# Patient Record
Sex: Female | Born: 1935
Health system: Southern US, Community
[De-identification: ages and names within clinical notes are randomized; demographics above are authoritative.]

## PROBLEM LIST (undated history)

## (undated) ENCOUNTER — Emergency Department (HOSPITAL_COMMUNITY): Discharge status: Absent without leave | Payer: Medicare HMO

## (undated) DIAGNOSIS — Z973 Presence of spectacles and contact lenses: Secondary | ICD-10-CM

## (undated) DIAGNOSIS — R0602 Shortness of breath: Secondary | ICD-10-CM

## (undated) DIAGNOSIS — I350 Nonrheumatic aortic (valve) stenosis: Secondary | ICD-10-CM

## (undated) DIAGNOSIS — Z972 Presence of dental prosthetic device (complete) (partial): Secondary | ICD-10-CM

## (undated) DIAGNOSIS — R233 Spontaneous ecchymoses: Secondary | ICD-10-CM

## (undated) DIAGNOSIS — G4730 Sleep apnea, unspecified: Secondary | ICD-10-CM

## (undated) DIAGNOSIS — R197 Diarrhea, unspecified: Secondary | ICD-10-CM

## (undated) DIAGNOSIS — F419 Anxiety disorder, unspecified: Secondary | ICD-10-CM

## (undated) DIAGNOSIS — M549 Dorsalgia, unspecified: Secondary | ICD-10-CM

## (undated) DIAGNOSIS — I5032 Chronic diastolic (congestive) heart failure: Secondary | ICD-10-CM

## (undated) DIAGNOSIS — R51 Headache: Secondary | ICD-10-CM

## (undated) DIAGNOSIS — F329 Major depressive disorder, single episode, unspecified: Secondary | ICD-10-CM

## (undated) DIAGNOSIS — K759 Inflammatory liver disease, unspecified: Secondary | ICD-10-CM

## (undated) DIAGNOSIS — M1990 Unspecified osteoarthritis, unspecified site: Secondary | ICD-10-CM

## (undated) DIAGNOSIS — E785 Hyperlipidemia, unspecified: Secondary | ICD-10-CM

## (undated) DIAGNOSIS — F32A Depression, unspecified: Secondary | ICD-10-CM

## (undated) DIAGNOSIS — I10 Essential (primary) hypertension: Secondary | ICD-10-CM

## (undated) DIAGNOSIS — R519 Headache, unspecified: Secondary | ICD-10-CM

## (undated) DIAGNOSIS — D649 Anemia, unspecified: Secondary | ICD-10-CM

## (undated) DIAGNOSIS — R238 Other skin changes: Secondary | ICD-10-CM

## (undated) DIAGNOSIS — G4734 Idiopathic sleep related nonobstructive alveolar hypoventilation: Secondary | ICD-10-CM

## (undated) DIAGNOSIS — G8929 Other chronic pain: Secondary | ICD-10-CM

## (undated) HISTORY — DX: Presence of dental prosthetic device (complete) (partial): Z97.2

## (undated) HISTORY — DX: Presence of spectacles and contact lenses: Z97.3

## (undated) HISTORY — DX: Anxiety disorder, unspecified: F41.9

## (undated) HISTORY — PX: KNEE ARTHROSCOPY: SHX127

## (undated) HISTORY — DX: Major depressive disorder, single episode, unspecified: F32.9

## (undated) HISTORY — DX: Diarrhea, unspecified: R19.7

## (undated) HISTORY — PX: APPENDECTOMY: SHX54

## (undated) HISTORY — DX: Headache, unspecified: R51.9

## (undated) HISTORY — PX: ABDOMINAL HYSTERECTOMY: SHX81

## (undated) HISTORY — DX: Essential (primary) hypertension: I10

## (undated) HISTORY — DX: Anemia, unspecified: D64.9

## (undated) HISTORY — PX: TONSILLECTOMY: SUR1361

## (undated) HISTORY — PX: CHOLECYSTECTOMY: SHX55

## (undated) HISTORY — DX: Other chronic pain: G89.29

## (undated) HISTORY — PX: JOINT REPLACEMENT: SHX530

## (undated) HISTORY — DX: Spontaneous ecchymoses: R23.3

## (undated) HISTORY — DX: Headache: R51

## (undated) HISTORY — DX: Hyperlipidemia, unspecified: E78.5

## (undated) HISTORY — DX: Depression, unspecified: F32.A

## (undated) HISTORY — PX: TONSILLECTOMY: SHX5217

## (undated) HISTORY — DX: Unspecified osteoarthritis, unspecified site: M19.90

## (undated) HISTORY — DX: Other skin changes: R23.8

## (undated) SURGERY — ARTHROSCOPY, SHOULDER
Anesthesia: General | Laterality: Left

---

## 2002-01-05 ENCOUNTER — Encounter: Admission: RE | Admit: 2002-01-05 | Discharge: 2002-01-05 | Payer: Self-pay | Admitting: *Deleted

## 2002-04-22 ENCOUNTER — Emergency Department (HOSPITAL_COMMUNITY): Admission: EM | Admit: 2002-04-22 | Discharge: 2002-04-22 | Payer: Self-pay | Admitting: Emergency Medicine

## 2003-03-02 ENCOUNTER — Encounter: Admission: RE | Admit: 2003-03-02 | Discharge: 2003-03-02 | Payer: Self-pay | Admitting: Gastroenterology

## 2003-03-02 ENCOUNTER — Encounter: Payer: Self-pay | Admitting: Gastroenterology

## 2003-03-18 ENCOUNTER — Emergency Department (HOSPITAL_COMMUNITY): Admission: EM | Admit: 2003-03-18 | Discharge: 2003-03-18 | Payer: Self-pay | Admitting: Emergency Medicine

## 2003-03-21 ENCOUNTER — Inpatient Hospital Stay (HOSPITAL_COMMUNITY): Admission: EM | Admit: 2003-03-21 | Discharge: 2003-03-26 | Payer: Self-pay | Admitting: Psychiatry

## 2003-04-05 ENCOUNTER — Encounter: Payer: Self-pay | Admitting: Internal Medicine

## 2003-04-05 ENCOUNTER — Ambulatory Visit (HOSPITAL_COMMUNITY): Admission: RE | Admit: 2003-04-05 | Discharge: 2003-04-05 | Payer: Self-pay | Admitting: Internal Medicine

## 2003-04-18 ENCOUNTER — Encounter (INDEPENDENT_AMBULATORY_CARE_PROVIDER_SITE_OTHER): Payer: Self-pay | Admitting: *Deleted

## 2003-04-18 ENCOUNTER — Ambulatory Visit (HOSPITAL_COMMUNITY): Admission: RE | Admit: 2003-04-18 | Discharge: 2003-04-18 | Payer: Self-pay | Admitting: Gastroenterology

## 2003-04-20 ENCOUNTER — Encounter: Admission: RE | Admit: 2003-04-20 | Discharge: 2003-04-20 | Payer: Self-pay | Admitting: *Deleted

## 2003-07-11 ENCOUNTER — Ambulatory Visit (HOSPITAL_COMMUNITY): Admission: RE | Admit: 2003-07-11 | Discharge: 2003-07-11 | Payer: Self-pay | Admitting: Internal Medicine

## 2003-07-11 ENCOUNTER — Encounter: Payer: Self-pay | Admitting: Internal Medicine

## 2004-05-20 ENCOUNTER — Emergency Department (HOSPITAL_COMMUNITY): Admission: EM | Admit: 2004-05-20 | Discharge: 2004-05-20 | Payer: Self-pay | Admitting: Emergency Medicine

## 2005-04-10 ENCOUNTER — Ambulatory Visit (HOSPITAL_COMMUNITY): Admission: RE | Admit: 2005-04-10 | Discharge: 2005-04-10 | Payer: Self-pay | Admitting: Orthopedic Surgery

## 2005-07-09 ENCOUNTER — Encounter: Admission: RE | Admit: 2005-07-09 | Discharge: 2005-07-09 | Payer: Self-pay | Admitting: Orthopedic Surgery

## 2005-10-21 ENCOUNTER — Ambulatory Visit (HOSPITAL_COMMUNITY): Admission: RE | Admit: 2005-10-21 | Discharge: 2005-10-21 | Payer: Self-pay | Admitting: Orthopedic Surgery

## 2005-10-24 ENCOUNTER — Ambulatory Visit (HOSPITAL_COMMUNITY): Admission: RE | Admit: 2005-10-24 | Discharge: 2005-10-24 | Payer: Self-pay | Admitting: Orthopedic Surgery

## 2006-01-07 ENCOUNTER — Encounter: Admission: RE | Admit: 2006-01-07 | Discharge: 2006-01-07 | Payer: Self-pay | Admitting: Family Medicine

## 2006-02-20 ENCOUNTER — Ambulatory Visit (HOSPITAL_COMMUNITY): Admission: RE | Admit: 2006-02-20 | Discharge: 2006-02-20 | Payer: Self-pay | Admitting: Orthopedic Surgery

## 2006-03-06 ENCOUNTER — Encounter: Admission: RE | Admit: 2006-03-06 | Discharge: 2006-06-04 | Payer: Self-pay | Admitting: Orthopedic Surgery

## 2006-05-31 ENCOUNTER — Emergency Department (HOSPITAL_COMMUNITY): Admission: EM | Admit: 2006-05-31 | Discharge: 2006-05-31 | Payer: Self-pay | Admitting: Emergency Medicine

## 2006-06-02 ENCOUNTER — Ambulatory Visit (HOSPITAL_COMMUNITY): Admission: RE | Admit: 2006-06-02 | Discharge: 2006-06-02 | Payer: Self-pay | Admitting: Orthopedic Surgery

## 2006-07-04 ENCOUNTER — Encounter: Admission: RE | Admit: 2006-07-04 | Discharge: 2006-07-04 | Payer: Self-pay | Admitting: Orthopedic Surgery

## 2006-10-27 ENCOUNTER — Emergency Department (HOSPITAL_COMMUNITY): Admission: EM | Admit: 2006-10-27 | Discharge: 2006-10-27 | Payer: Self-pay | Admitting: Emergency Medicine

## 2007-02-06 ENCOUNTER — Encounter: Admission: RE | Admit: 2007-02-06 | Discharge: 2007-02-06 | Payer: Self-pay | Admitting: Orthopedic Surgery

## 2007-02-20 ENCOUNTER — Encounter: Admission: RE | Admit: 2007-02-20 | Discharge: 2007-02-20 | Payer: Self-pay | Admitting: Orthopedic Surgery

## 2007-03-13 ENCOUNTER — Ambulatory Visit (HOSPITAL_COMMUNITY): Admission: RE | Admit: 2007-03-13 | Discharge: 2007-03-14 | Payer: Self-pay | Admitting: Orthopedic Surgery

## 2007-03-30 ENCOUNTER — Encounter: Admission: RE | Admit: 2007-03-30 | Discharge: 2007-05-19 | Payer: Self-pay | Admitting: Orthopedic Surgery

## 2007-07-15 ENCOUNTER — Emergency Department (HOSPITAL_COMMUNITY): Admission: EM | Admit: 2007-07-15 | Discharge: 2007-07-16 | Payer: Self-pay | Admitting: Emergency Medicine

## 2007-08-21 ENCOUNTER — Encounter: Admission: RE | Admit: 2007-08-21 | Discharge: 2007-08-21 | Payer: Self-pay | Admitting: Orthopedic Surgery

## 2008-01-02 ENCOUNTER — Emergency Department (HOSPITAL_COMMUNITY): Admission: EM | Admit: 2008-01-02 | Discharge: 2008-01-02 | Payer: Self-pay | Admitting: Family Medicine

## 2008-04-07 ENCOUNTER — Encounter: Admission: RE | Admit: 2008-04-07 | Discharge: 2008-04-07 | Payer: Self-pay | Admitting: Orthopedic Surgery

## 2008-05-17 ENCOUNTER — Ambulatory Visit (HOSPITAL_COMMUNITY): Admission: RE | Admit: 2008-05-17 | Discharge: 2008-05-18 | Payer: Self-pay | Admitting: Orthopedic Surgery

## 2008-11-25 HISTORY — PX: ROTATOR CUFF REPAIR: SHX139

## 2008-12-15 ENCOUNTER — Encounter: Admission: RE | Admit: 2008-12-15 | Discharge: 2008-12-15 | Payer: Self-pay | Admitting: Gastroenterology

## 2009-03-09 ENCOUNTER — Encounter: Admission: RE | Admit: 2009-03-09 | Discharge: 2009-03-09 | Payer: Self-pay | Admitting: Family Medicine

## 2009-05-31 ENCOUNTER — Encounter: Admission: RE | Admit: 2009-05-31 | Discharge: 2009-05-31 | Payer: Self-pay | Admitting: Family Medicine

## 2009-08-30 ENCOUNTER — Encounter: Admission: RE | Admit: 2009-08-30 | Discharge: 2009-08-30 | Payer: Self-pay | Admitting: Orthopedic Surgery

## 2009-11-25 HISTORY — PX: REVISION TOTAL KNEE ARTHROPLASTY: SHX767

## 2010-03-22 ENCOUNTER — Encounter: Admission: RE | Admit: 2010-03-22 | Discharge: 2010-03-22 | Payer: Self-pay | Admitting: Orthopedic Surgery

## 2010-04-16 ENCOUNTER — Encounter: Admission: RE | Admit: 2010-04-16 | Discharge: 2010-04-16 | Payer: Self-pay | Admitting: Gastroenterology

## 2010-04-26 ENCOUNTER — Emergency Department (HOSPITAL_COMMUNITY): Admission: EM | Admit: 2010-04-26 | Discharge: 2010-04-26 | Payer: Self-pay | Admitting: Family Medicine

## 2010-05-03 ENCOUNTER — Inpatient Hospital Stay (HOSPITAL_COMMUNITY): Admission: RE | Admit: 2010-05-03 | Discharge: 2010-05-07 | Payer: Self-pay | Admitting: Orthopedic Surgery

## 2010-07-26 LAB — HM MAMMOGRAPHY: HM Mammogram: NORMAL

## 2010-08-01 ENCOUNTER — Encounter: Admission: RE | Admit: 2010-08-01 | Discharge: 2010-08-01 | Payer: Self-pay | Admitting: Family Medicine

## 2010-12-03 ENCOUNTER — Encounter: Payer: Self-pay | Admitting: Internal Medicine

## 2010-12-03 ENCOUNTER — Other Ambulatory Visit: Payer: Self-pay | Admitting: Internal Medicine

## 2010-12-03 ENCOUNTER — Ambulatory Visit
Admission: RE | Admit: 2010-12-03 | Discharge: 2010-12-03 | Payer: Self-pay | Source: Home / Self Care | Attending: Internal Medicine | Admitting: Internal Medicine

## 2010-12-03 DIAGNOSIS — B354 Tinea corporis: Secondary | ICD-10-CM | POA: Insufficient documentation

## 2010-12-03 DIAGNOSIS — D649 Anemia, unspecified: Secondary | ICD-10-CM | POA: Insufficient documentation

## 2010-12-03 DIAGNOSIS — M1990 Unspecified osteoarthritis, unspecified site: Secondary | ICD-10-CM | POA: Insufficient documentation

## 2010-12-03 DIAGNOSIS — F329 Major depressive disorder, single episode, unspecified: Secondary | ICD-10-CM | POA: Insufficient documentation

## 2010-12-03 DIAGNOSIS — I10 Essential (primary) hypertension: Secondary | ICD-10-CM | POA: Insufficient documentation

## 2010-12-03 DIAGNOSIS — E119 Type 2 diabetes mellitus without complications: Secondary | ICD-10-CM | POA: Insufficient documentation

## 2010-12-03 DIAGNOSIS — F411 Generalized anxiety disorder: Secondary | ICD-10-CM | POA: Insufficient documentation

## 2010-12-03 DIAGNOSIS — E785 Hyperlipidemia, unspecified: Secondary | ICD-10-CM | POA: Insufficient documentation

## 2010-12-03 LAB — BASIC METABOLIC PANEL
BUN: 12 mg/dL (ref 6–23)
CO2: 32 mEq/L (ref 19–32)
Calcium: 9.3 mg/dL (ref 8.4–10.5)
Chloride: 108 mEq/L (ref 96–112)
Creatinine, Ser: 0.5 mg/dL (ref 0.4–1.2)
GFR: 161.8 mL/min (ref >60.00)
Glucose, Bld: 84 mg/dL (ref 70–99)
Potassium: 4.1 mEq/L (ref 3.5–5.1)
Sodium: 144 mEq/L (ref 135–145)

## 2010-12-03 LAB — CBC WITH DIFFERENTIAL/PLATELET
Basophils Absolute: 0 10*3/uL (ref 0.0–0.1)
Basophils Relative: 0.5 % (ref 0.0–3.0)
Eosinophils Absolute: 0.2 10*3/uL (ref 0.0–0.7)
Eosinophils Relative: 2.5 % (ref 0.0–5.0)
HCT: 38 % (ref 36.0–46.0)
Hemoglobin: 12.8 g/dL (ref 12.0–15.0)
Lymphocytes Relative: 44 % (ref 12.0–46.0)
Lymphs Abs: 3.2 10*3/uL (ref 0.7–4.0)
MCHC: 33.7 g/dL (ref 30.0–36.0)
MCV: 95 fl (ref 78.0–100.0)
Monocytes Absolute: 0.5 10*3/uL (ref 0.1–1.0)
Monocytes Relative: 7 % (ref 3.0–12.0)
Neutro Abs: 3.3 10*3/uL (ref 1.4–7.7)
Neutrophils Relative %: 46 % (ref 43.0–77.0)
Platelets: 142 10*3/uL — ABNORMAL LOW (ref 150.0–400.0)
RBC: 4 Mil/uL (ref 3.87–5.11)
RDW: 14.5 % (ref 11.5–14.6)
WBC: 7.2 10*3/uL (ref 4.5–10.5)

## 2010-12-03 LAB — URINALYSIS, ROUTINE W REFLEX MICROSCOPIC
Bilirubin Urine: NEGATIVE
Ketones, ur: NEGATIVE
Nitrite: NEGATIVE
Specific Gravity, Urine: 1.02 (ref 1.000–1.030)
Total Protein, Urine: NEGATIVE
Urine Glucose: NEGATIVE
Urobilinogen, UA: 0.2 (ref 0.0–1.0)
pH: 6 (ref 5.0–8.0)

## 2010-12-03 LAB — HEPATIC FUNCTION PANEL
ALT: 12 U/L (ref 0–35)
AST: 15 U/L (ref 0–37)
Albumin: 3.5 g/dL (ref 3.5–5.2)
Alkaline Phosphatase: 53 U/L (ref 39–117)
Bilirubin, Direct: 0.1 mg/dL (ref 0.0–0.3)
Total Bilirubin: 0.9 mg/dL (ref 0.3–1.2)
Total Protein: 6.3 g/dL (ref 6.0–8.3)

## 2010-12-03 LAB — HEMOGLOBIN A1C: Hgb A1c MFr Bld: 5.7 % (ref 4.6–6.5)

## 2010-12-03 LAB — TSH: TSH: 3.22 u[IU]/mL (ref 0.35–5.50)

## 2010-12-03 LAB — LIPID PANEL
Cholesterol: 223 mg/dL — ABNORMAL HIGH (ref 0–200)
HDL: 76 mg/dL (ref >39.00)
Total CHOL/HDL Ratio: 3
Triglycerides: 226 mg/dL — ABNORMAL HIGH (ref 0.0–149.0)
VLDL: 45.2 mg/dL — ABNORMAL HIGH (ref 0.0–40.0)

## 2010-12-03 LAB — IBC PANEL
Iron: 67 ug/dL (ref 42–145)
Saturation Ratios: 17.3 % — ABNORMAL LOW (ref 20.0–50.0)
Transferrin: 276.8 mg/dL (ref 212.0–360.0)

## 2010-12-03 LAB — B12 AND FOLATE PANEL
Folate: 11.9 ng/mL
Vitamin B-12: 294 pg/mL (ref 211–911)

## 2010-12-03 LAB — LDL CHOLESTEROL, DIRECT: Direct LDL: 122.9 mg/dL

## 2010-12-04 ENCOUNTER — Telehealth: Payer: Self-pay | Admitting: Internal Medicine

## 2010-12-04 DIAGNOSIS — E559 Vitamin D deficiency, unspecified: Secondary | ICD-10-CM | POA: Insufficient documentation

## 2010-12-16 ENCOUNTER — Encounter: Payer: Self-pay | Admitting: Gastroenterology

## 2010-12-27 NOTE — Progress Notes (Signed)
  Phone Note Outgoing Call   Summary of Call: LA - please tell her that her Vitamin D level is vey low, an Rx has been sent in. TJ Initial call taken by: Etta Grandchild MD,  December 04, 2010 7:40 AM  Follow-up for Phone Call        Patien notified.[name] CMA  December 04, 2010 11:37 AM  Follow-up by: Etta Grandchild MD,  December 04, 2010 7:38 AM  New Problems: VITAMIN D DEFICIENCY (ICD-268.9)   New Problems: VITAMIN D DEFICIENCY (ICD-268.9) New/Updated Medications: VITAMIN D3 2000 UNIT CAPS (CHOLECALCIFEROL) One by mouth once daily Prescriptions: VITAMIN D3 2000 UNIT CAPS (CHOLECALCIFEROL) One by mouth once daily  #30 x 11   Entered and Authorized by:   Etta Grandchild MD   Signed by:   Etta Grandchild MD on 12/04/2010   Method used:   Electronically to        [facility] (retail)       [address]       [place], [place]  [id number]       Ph: [phone]       Fax: [phone]   RxID:   [id number]

## 2010-12-27 NOTE — Assessment & Plan Note (Signed)
Summary: New / [employer] / # / cd   Vital Signs:  Patient profile:   77 year old female Menstrual status:  hysterectomy Height:      62 inches Weight:      216.50 pounds BMI:     39.74 O2 Sat:      97 % on Room air Temp:     98.3 degrees F oral Pulse rate:   68 / minute Pulse rhythm:   regular Resp:     16 per minute BP sitting:   140 / 72  (left arm) Cuff size:   large  Vitals Entered By: Rock Nephew CMA (December 03, 2010 10:32 AM)  Nutrition Counseling: Patient's BMI is greater than 25 and therefore counseled on weight management options.  O2 Flow:  Room air CC: New to establish Is Patient Diabetic? Yes Did you bring your meter with you today? No Pain Assessment Patient in pain? no       Does patient need assistance? Functional Status Self care Ambulation Normal     Menstrual Status hysterectomy   Primary Care Provider:  Etta Grandchild MD  CC:  New to establish.  History of Present Illness:  Follow-Up Visit      This is a 77 year old woman who presents for Follow-up visit.  The patient denies chest pain, palpitations, dizziness, syncope, low blood sugar symptoms, high blood sugar symptoms, edema, SOB, DOE, PND, and orthopnea.  Since the last visit the patient notes problems with medications.  The patient reports taking meds as prescribed, monitoring BP, monitoring blood sugars, and dietary noncompliance.  When questioned about possible medication side effects, the patient notes GI upset.    She compains of an itchy rash under both breasts that has not inproved with ? steroid cream.  Preventive Screening-Counseling & Management  Alcohol-Tobacco     Alcohol drinks/day: 0     Alcohol Counseling: not indicated; patient does not drink     Smoking Status: never     Tobacco Counseling: not indicated; no tobacco use  Caffeine-Diet-Exercise     Does Patient Exercise: yes  Hep-HIV-STD-Contraception     Hepatitis Risk: no risk noted     HIV Risk: no risk  noted     STD Risk: no risk noted     Dental Visit-last 6 months yes     Dental Care Counseling: to seek dental care; no dental care within six months     SBE monthly: yes     SBE Education/Counseling: to perform regular SBE  Safety-Violence-Falls     Seat Belt Use: yes      Drug Use:  no.    Current Medications (verified): 1)  Hyoscyamine Sulfate 0.125 Mg Tabs (Hyoscyamine Sulfate) .... Every 4 Hours As Directed 2)  Alprazolam 1 Mg Tabs (Alprazolam) .... Take 1 Tablet By Mouth Three Times A Day 3)  Seroquel 200 Mg Tabs (Quetiapine Fumarate) .... Take 1 Tab By Mouth At Bedtime 4)  Crestor 10 Mg Tabs (Rosuvastatin Calcium) .... Take 1 Tablet By Mouth Once A Day 5)  Enalapril-Hydrochlorothiazide 5-12.5 Mg Tabs (Enalapril-Hydrochlorothiazide) .... Take 2 Tab Daily 6)  Amlodipine Besylate 10 Mg Tabs (Amlodipine Besylate) .... Take 1 Tablet By Mouth Once A Day 7)  Metformin Hcl 500 Mg Tabs (Metformin Hcl) .... Take 1 Tablet By Mouth Once A Day  Allergies (verified): 1)  ! Metformin Hcl  Past History:  Past Medical History: Anemia-NOS Anxiety Depression Diabetes mellitus, type II Hyperlipidemia Hypertension Osteoarthritis  Past Surgical History: Appendectomy Cholecystectomy Hysterectomy Tonsillectomy  Family History: Family History of Alcoholism/Addiction Family History Hypertension Family History Kidney disease Family History of Mental/Emotional Illness  Social History: Retired Conservation officer, nature Never Smoked Alcohol use-no Drug use-no Regular exercise-yes Smoking Status:  never Drug Use:  no Does Patient Exercise:  yes Seat Belt Use:  yes Alcohol:  None Hepatitis Risk:  no risk noted HIV Risk:  no risk noted STD Risk:  no risk noted Dental Care w/in 6 mos.:  yes  Review of Systems       The patient complains of weight gain.  The patient denies anorexia, fever, weight loss, chest pain, syncope, dyspnea on exertion, peripheral edema, prolonged cough, headaches,  hemoptysis, abdominal pain, melena, hematochezia, severe indigestion/heartburn, hematuria, suspicious skin lesions, difficulty walking, depression, abnormal bleeding, angioedema, and breast masses.   GI:  Complains of diarrhea, excessive appetite, and gas; denies abdominal pain, change in bowel habits, constipation, dark tarry stools, indigestion, loss of appetite, nausea, vomiting, vomiting blood, and yellowish skin color. GU:  Complains of nocturia; denies abnormal vaginal bleeding, decreased libido, discharge, dysuria, hematuria, urinary frequency, and urinary hesitancy. Psych:  Complains of anxiety; denies alternate hallucination ( auditory/visual), depression, easily angered, easily tearful, irritability, mental problems, panic attacks, sense of great danger, suicidal thoughts/plans, thoughts of violence, unusual visions or sounds, and thoughts /plans of harming others. Endo:  Complains of polyuria; denies cold intolerance, excessive hunger, excessive thirst, excessive urination, heat intolerance, and weight change.  Physical Exam  General:  alert, well-developed, well-nourished, well-hydrated, appropriate dress, normal appearance, cooperative to examination, good hygiene, and overweight-appearing.   Head:  normocephalic, atraumatic, no abnormalities observed, and no abnormalities palpated.   Eyes:  vision grossly intact, pupils equal, and pupils round.   Ears:  R ear normal and L ear normal.   Nose:  External nasal examination shows no deformity or inflammation. Nasal mucosa are pink and moist without lesions or exudates. Mouth:  Oral mucosa and oropharynx without lesions or exudates.  Teeth in good repair. Neck:  supple, full ROM, no masses, no thyromegaly, no JVD, normal carotid upstroke, no carotid bruits, and no cervical lymphadenopathy.   Chest Wall:  no deformities, no tenderness, and no mass.   Breasts:  no masses, no abnormal thickening, no nipple discharge, no tenderness, and no  adenopathy.   Lungs:  normal respiratory effort, no intercostal retractions, no accessory muscle use, normal breath sounds, no dullness, no fremitus, no crackles, and no wheezes.   Heart:  normal rate, regular rhythm, no murmur, no gallop, no rub, and no JVD.   Abdomen:  soft, non-tender, normal bowel sounds, no distention, no masses, no guarding, no rigidity, no rebound tenderness, no abdominal hernia, no inguinal hernia, no hepatomegaly, no splenomegaly, and abdominal scar(s).   Msk:  enlarged MCP joints, enlarged PIP joints, and enlarged DIP joints.   Pulses:  R and L carotid,radial,femoral,dorsalis pedis and posterior tibial pulses are full and equal bilaterally Extremities:  No clubbing, cyanosis, edema, or deformity noted with normal full range of motion of all joints.   Neurologic:  No cranial nerve deficits noted. Station and gait are normal. Plantar reflexes are down-going bilaterally. DTRs are symmetrical throughout. Sensory, motor and coordinative functions appear intact. Skin:  under both breasts she has scattered scaly hyperpigemented macules with sharp borders, turgor normal, color normal, no rashes, no suspicious lesions, no ecchymoses, no petechiae, no purpura, no ulcerations, and no edema.   Cervical Nodes:  no anterior cervical adenopathy and no posterior cervical  adenopathy.   Axillary Nodes:  no R axillary adenopathy and no L axillary adenopathy.   Psych:  Cognition and judgment appear intact. Alert and cooperative with normal attention span and concentration. No apparent delusions, illusions, hallucinations  Diabetes Management Exam:    Foot Exam (with socks and/or shoes not present):       Sensory-Pinprick/Light touch:          Left medial foot (L-4): normal          Left dorsal foot (L-5): normal          Left lateral foot (S-1): normal          Right medial foot (L-4): normal          Right dorsal foot (L-5): normal          Right lateral foot (S-1): normal        Sensory-Monofilament:          Left foot: normal          Right foot: normal       Inspection:          Left foot: normal          Right foot: normal       Nails:          Left foot: normal          Right foot: normal   Impression & Recommendations:  Problem # 1:  DIABETES MELLITUS, TYPE II (ICD-250.00) Assessment Deteriorated  The following medications were removed from the medication list:    Metformin Hcl 500 Mg Tabs (Metformin hcl) .Marland Kitchen... Take 1 tablet by mouth once a day Her updated medication list for this problem includes:    Enalapril-hydrochlorothiazide 5-12.5 Mg Tabs (Enalapril-hydrochlorothiazide) .Marland Kitchen... Take 2 tab daily  Orders: Venipuncture ([address]454) TLB-Lipid Panel (80061-LIPID) TLB-BMP (Basic Metabolic Panel-BMET) (80048-METABOL) TLB-CBC Platelet - w/Differential (85025-CBCD) TLB-Hepatic/Liver Function Pnl (80076-HEPATIC) TLB-TSH (Thyroid Stimulating Hormone) (84443-TSH) TLB-B12 + Folate Pnl (09811_91478-G95/AOZ) TLB-IBC Pnl (Iron/FE;Transferrin) (83550-IBC) TLB-A1C / Hgb A1C (Glycohemoglobin) (83036-A1C) TLB-Udip w/ Micro (81001-URINE) T-Vitamin D (25-Hydroxy) (30865-78469) Ophthalmology Referral (Ophthalmology)  Problem # 2:  HYPERTENSION (ICD-401.9) Assessment: Improved  Her updated medication list for this problem includes:    Enalapril-hydrochlorothiazide 5-12.5 Mg Tabs (Enalapril-hydrochlorothiazide) .Marland Kitchen... Take 2 tab daily    Amlodipine Besylate 10 Mg Tabs (Amlodipine besylate) .Marland Kitchen... Take 1 tablet by mouth once a day  Orders: Venipuncture (62952) TLB-Lipid Panel (80061-LIPID) TLB-BMP (Basic Metabolic Panel-BMET) (80048-METABOL) TLB-CBC Platelet - w/Differential (85025-CBCD) TLB-Hepatic/Liver Function Pnl (80076-HEPATIC) TLB-TSH (Thyroid Stimulating Hormone) (84443-TSH) TLB-B12 + Folate Pnl (84132_44010-U72/ZDG) TLB-IBC Pnl (Iron/FE;Transferrin) (83550-IBC) TLB-A1C / Hgb A1C (Glycohemoglobin) (83036-A1C) TLB-Udip w/ Micro (81001-URINE) T-Vitamin D  (25-Hydroxy) (64403-47425)  BP today: 140/72  Problem # 3:  DEPRESSION (ICD-311) Assessment: Unchanged  Her updated medication list for this problem includes:    Alprazolam 1 Mg Tabs (Alprazolam) .Marland Kitchen... Take 1 tablet by mouth three times a day  Orders: Venipuncture (95638) TLB-Lipid Panel (80061-LIPID) TLB-BMP (Basic Metabolic Panel-BMET) (80048-METABOL) TLB-CBC Platelet - w/Differential (85025-CBCD) TLB-Hepatic/Liver Function Pnl (80076-HEPATIC) TLB-TSH (Thyroid Stimulating Hormone) (84443-TSH) TLB-B12 + Folate Pnl (75643_32951-O84/ZYS) TLB-IBC Pnl (Iron/FE;Transferrin) (83550-IBC) TLB-A1C / Hgb A1C (Glycohemoglobin) (83036-A1C) TLB-Udip w/ Micro (81001-URINE) T-Vitamin D (25-Hydroxy) (06301-60109)  Problem # 4:  ANXIETY (ICD-300.00) Assessment: Unchanged  Her updated medication list for this problem includes:    Alprazolam 1 Mg Tabs (Alprazolam) .Marland Kitchen... Take 1 tablet by mouth three times a day  Orders: Venipuncture (32355) TLB-Lipid Panel (80061-LIPID) TLB-BMP (Basic Metabolic Panel-BMET) (80048-METABOL) TLB-CBC Platelet - w/Differential (85025-CBCD)  TLB-Hepatic/Liver Function Pnl (80076-HEPATIC) TLB-TSH (Thyroid Stimulating Hormone) (84443-TSH) TLB-B12 + Folate Pnl (16109_60454-U98/JXB) TLB-IBC Pnl (Iron/FE;Transferrin) (83550-IBC) TLB-A1C / Hgb A1C (Glycohemoglobin) (83036-A1C) TLB-Udip w/ Micro (81001-URINE) T-Vitamin D (25-Hydroxy) (14782-95621)  Problem # 5:  ANEMIA-NOS (ICD-285.9) Assessment: Unchanged  Orders: Venipuncture (30865) TLB-Lipid Panel (80061-LIPID) TLB-BMP (Basic Metabolic Panel-BMET) (80048-METABOL) TLB-CBC Platelet - w/Differential (85025-CBCD) TLB-Hepatic/Liver Function Pnl (80076-HEPATIC) TLB-TSH (Thyroid Stimulating Hormone) (84443-TSH) TLB-B12 + Folate Pnl (78469_62952-W41/LKG) TLB-IBC Pnl (Iron/FE;Transferrin) (83550-IBC) TLB-A1C / Hgb A1C (Glycohemoglobin) (83036-A1C) TLB-Udip w/ Micro (81001-URINE) T-Vitamin D (25-Hydroxy)  (40102-72536)  Problem # 6:  TINEA CORPORIS (ICD-110.5) Assessment: New start ketoconazole cream  Complete Medication List: 1)  Alprazolam 1 Mg Tabs (Alprazolam) .... Take 1 tablet by mouth three times a day 2)  Seroquel 200 Mg Tabs (Quetiapine fumarate) .... Take 1 tab by mouth at bedtime 3)  Enalapril-hydrochlorothiazide 5-12.5 Mg Tabs (Enalapril-hydrochlorothiazide) .... Take 2 tab daily 4)  Amlodipine Besylate 10 Mg Tabs (Amlodipine besylate) .... Take 1 tablet by mouth once a day 5)  Pravastatin Sodium 80 Mg Tabs (Pravastatin sodium) .... One by mouth once daily for cholesterol 6)  Ketoconazole 2 % Crea (Ketoconazole) .... Apply under both breasts two times a day for 14 days  Patient Instructions: 1)  Please schedule a follow-up appointment in 2 months. 2)  It is important that you exercise regularly at least 20 minutes 5 times a week. If you develop chest pain, have severe difficulty breathing, or feel very tired , stop exercising immediately and seek medical attention. 3)  You need to lose weight. Consider a lower calorie diet and regular exercise.  4)  Schedule your mammogram. 5)  Check your blood sugars regularly. If your readings are usually above 200 or below 70 you should contact our office. 6)  It is important that your Diabetic A1c level is checked every 3 months. 7)  See your eye doctor yearly to check for diabetic eye damage. 8)  Check your feet each night for sore areas, calluses or signs of infection. 9)  Check your Blood Pressure regularly. If it is above 130/80: you should make an appointment. Prescriptions: KETOCONAZOLE 2 % CREA (KETOCONAZOLE) apply under both breasts two times a day for 14 days  #60 gms x 3   Entered and Authorized by:   Etta Grandchild MD   Signed by:   Etta Grandchild MD on 12/03/2010   Method used:   Electronically to        [facility] (retail)       [address]       [place], [place]  [address]       Ph:  [phone]       Fax: [phone]   RxID:   [id number] ENALAPRIL-HYDROCHLOROTHIAZIDE 5-12.5 MG TABS (ENALAPRIL-HYDROCHLOROTHIAZIDE) Take 2 tab daily  #60 x 11   Entered and Authorized by:   Etta Grandchild MD   Signed by:   Etta Grandchild MD on 12/03/2010   Method used:   Electronically to        [facility] (retail)       [address]       [place], [place]  [id number]       Ph: [phone]       Fax: [phone]   RxID:   [id number] ALPRAZOLAM 1 MG TABS (ALPRAZOLAM) Take 1 tablet by mouth three times a day  #90 x 5   Entered and Authorized by:   Etta Grandchild  MD   Signed by:   Etta Grandchild MD on 12/03/2010   Method used:   Print then Give to Patient   RxID:   [id number] PRAVASTATIN SODIUM 80 MG TABS (PRAVASTATIN SODIUM) One by mouth once daily for cholesterol  #30 x 11   Entered and Authorized by:   Etta Grandchild MD   Signed by:   Etta Grandchild MD on 12/03/2010   Method used:   Electronically to        [facility] (retail)       [address]       [place], [place]  [address]       Ph: [phone]       Fax: [phone]   RxID:   [id number]    Orders Added: 1)  Venipuncture [34742] 2)  TLB-Lipid Panel [80061-LIPID] 3)  TLB-BMP (Basic Metabolic Panel-BMET) [80048-METABOL] 4)  TLB-CBC Platelet - w/Differential [85025-CBCD] 5)  TLB-Hepatic/Liver Function Pnl [80076-HEPATIC] 6)  TLB-TSH (Thyroid Stimulating Hormone) [84443-TSH] 7)  TLB-B12 + Folate Pnl [82746_82607-B12/FOL] 8)  TLB-IBC Pnl (Iron/FE;Transferrin) [83550-IBC] 9)  TLB-A1C / Hgb A1C (Glycohemoglobin) [83036-A1C] 10)  TLB-Udip w/ Micro [81001-URINE] 11)  T-Vitamin D (25-Hydroxy) [59563-87564] 12)  New Patient Level IV [33295] 13)  Ophthalmology Referral [Ophthalmology]   Not Administered:    Influenza Vaccine not given due to: declined   Preventive Care Screening  Mammogram:    Date:  07/26/2010    Results:  normal    Last Tetanus Booster:    Date:  04/25/2010    Results:  Adacel   Bone Density:    Date:  11/25/2009    Results:  normal std dev  Colonoscopy:    Date:  11/25/2008    Results:  normal

## 2010-12-27 NOTE — Letter (Signed)
Summary: Lipid Letter  [facility]  [address] [place], [place] [address]   Phone: [phone]  Fax: [phone]    12/03/2010  Rick Warnick [address] [place], [place]  [address]  Dear Ms. Mi:  We have carefully reviewed your last lipid profile from  and the results are noted below with a summary of recommendations for lipid management.    Cholesterol:       223     Goal: <200   HDL "good" Cholesterol:   57.84     Goal: >50   LDL "bad" Cholesterol:   123     Goal: <100   Triglycerides:       226.0     Goal: <150    your labs look pretty good, you should eat fewer calories and lose some weight, your blood sugars are very, very good - please follow-up as directed    TLC Diet (Therapeutic Lifestyle Change): Saturated Fats & Transfatty acids should be kept < 7% of total calories ***Reduce Saturated Fats Polyunstaurated Fat can be up to 10% of total calories Monounsaturated Fat Fat can be up to 20% of total calories Total Fat should be no greater than 25-35% of total calories Carbohydrates should be 50-60% of total calories Protein should be approximately 15% of total calories Fiber should be at least 20-30 grams a day ***Increased fiber may help lower LDL Total Cholesterol should be < 200mg /day Consider adding plant stanol/sterols to diet (example: Benacol spread) ***A higher intake of unsaturated fat may reduce Triglycerides and Increase HDL    Adjunctive Measures (may lower LIPIDS and reduce risk of Heart Attack) include: Aerobic Exercise (20-30 minutes 3-4 times a week) Limit Alcohol Consumption Weight Reduction Aspirin 75-81 mg a day by mouth (if not allergic or contraindicated) Dietary Fiber 20-30 grams a day by mouth     Current Medications: 1)    Alprazolam 1 Mg Tabs (Alprazolam) .... Take 1 tablet by mouth three times a day 2)    Seroquel 200 Mg Tabs (Quetiapine fumarate) .... Take 1 tab by mouth at bedtime 3)    Enalapril-hydrochlorothiazide  5-12.5 Mg Tabs (Enalapril-hydrochlorothiazide) .... Take 2 tab daily 4)    Amlodipine Besylate 10 Mg Tabs (Amlodipine besylate) .... Take 1 tablet by mouth once a day 5)    Pravastatin Sodium 80 Mg Tabs (Pravastatin sodium) .... One by mouth once daily for cholesterol 6)    Ketoconazole 2 % Crea (Ketoconazole) .... Apply under both breasts two times a day for 14 days  If you have any questions, please call. We appreciate being able to work with you.   Sincerely,    [facility] Etta Grandchild MD

## 2010-12-31 ENCOUNTER — Encounter: Payer: Self-pay | Admitting: Internal Medicine

## 2010-12-31 LAB — HM DIABETES EYE EXAM: HM Diabetic Eye Exam: NORMAL

## 2011-01-04 ENCOUNTER — Telehealth: Payer: Self-pay | Admitting: Internal Medicine

## 2011-01-04 ENCOUNTER — Encounter: Payer: Self-pay | Admitting: Internal Medicine

## 2011-01-10 NOTE — Miscellaneous (Signed)
Summary: diabetic eye exam  Clinical Lists Changes  Appended Document: diabetic eye exam    Clinical Lists Changes  Observations: Added new observation of TDBOOSTDUE: 04/25/2020 (01/04/2011 8:58) Added new observation of HDLNXTDUE: 12/04/2015 (01/04/2011 8:58) Added new observation of COLONNXTDUE: 11/25/2018 (01/04/2011 8:58) Added new observation of MAMMO DUE: 07/27/2011 (01/04/2011 8:58) Added new observation of DEXANXTDUE: None (01/04/2011 8:58) Added new observation of DB FT EX DUE: 12/04/2011 (01/04/2011 8:58) Added new observation of HGBA1CNXTDUE: 03/04/2011 (01/04/2011 8:58) Added new observation of CREATNXTDUE: 12/04/2011 (01/04/2011 8:58) Added new observation of POTASSIUMDUE: 12/04/2011 (01/04/2011 8:58)

## 2011-01-10 NOTE — Progress Notes (Signed)
     Diabetes Management Exam:    Eye Exam:       Eye Exam done elsewhere          Date: 12/31/2010          Results: normal          Done by: Elmer Picker

## 2011-01-16 NOTE — Letter (Signed)
Summary: [name]  [facility]   Imported By: Lester Clarendon 01/07/2011 10:10:39  _____________________________________________________________________  External Attachment:    Type:   Image     Comment:   External Document

## 2011-02-06 ENCOUNTER — Encounter: Payer: Self-pay | Admitting: Internal Medicine

## 2011-02-06 ENCOUNTER — Ambulatory Visit (INDEPENDENT_AMBULATORY_CARE_PROVIDER_SITE_OTHER): Payer: Medicare PPO | Admitting: Internal Medicine

## 2011-02-06 DIAGNOSIS — F411 Generalized anxiety disorder: Secondary | ICD-10-CM

## 2011-02-06 DIAGNOSIS — F329 Major depressive disorder, single episode, unspecified: Secondary | ICD-10-CM

## 2011-02-06 DIAGNOSIS — E785 Hyperlipidemia, unspecified: Secondary | ICD-10-CM

## 2011-02-06 DIAGNOSIS — I10 Essential (primary) hypertension: Secondary | ICD-10-CM

## 2011-02-06 DIAGNOSIS — E119 Type 2 diabetes mellitus without complications: Secondary | ICD-10-CM

## 2011-02-06 LAB — HM DIABETES FOOT EXAM

## 2011-02-11 LAB — GLUCOSE, CAPILLARY
Glucose-Capillary: 110 mg/dL — ABNORMAL HIGH (ref 70–99)
Glucose-Capillary: 119 mg/dL — ABNORMAL HIGH (ref 70–99)
Glucose-Capillary: 119 mg/dL — ABNORMAL HIGH (ref 70–99)
Glucose-Capillary: 126 mg/dL — ABNORMAL HIGH (ref 70–99)
Glucose-Capillary: 131 mg/dL — ABNORMAL HIGH (ref 70–99)
Glucose-Capillary: 133 mg/dL — ABNORMAL HIGH (ref 70–99)
Glucose-Capillary: 92 mg/dL (ref 70–99)
Glucose-Capillary: 96 mg/dL (ref 70–99)

## 2011-02-11 LAB — CBC
HCT: 30.2 % — ABNORMAL LOW (ref 36.0–46.0)
HCT: 37.9 % (ref 36.0–46.0)
Hemoglobin: 10.1 g/dL — ABNORMAL LOW (ref 12.0–15.0)
MCHC: 33.2 g/dL (ref 30.0–36.0)
MCHC: 33.6 g/dL (ref 30.0–36.0)
MCHC: 33.7 g/dL (ref 30.0–36.0)
MCHC: 34.2 g/dL (ref 30.0–36.0)
MCV: 92.2 fL (ref 78.0–100.0)
MCV: 92.5 fL (ref 78.0–100.0)
MCV: 92.7 fL (ref 78.0–100.0)
Platelets: 101 10*3/uL — ABNORMAL LOW (ref 150–400)
Platelets: 123 10*3/uL — ABNORMAL LOW (ref 150–400)
Platelets: 138 10*3/uL — ABNORMAL LOW (ref 150–400)
RBC: 3.27 MIL/uL — ABNORMAL LOW (ref 3.87–5.11)
RBC: 3.36 MIL/uL — ABNORMAL LOW (ref 3.87–5.11)
RBC: 3.59 MIL/uL — ABNORMAL LOW (ref 3.87–5.11)
RDW: 12.6 % (ref 11.5–15.5)
RDW: 12.7 % (ref 11.5–15.5)
RDW: 12.7 % (ref 11.5–15.5)
WBC: 6.7 10*3/uL (ref 4.0–10.5)
WBC: 8.2 10*3/uL (ref 4.0–10.5)

## 2011-02-11 LAB — URINALYSIS, ROUTINE W REFLEX MICROSCOPIC
Bilirubin Urine: NEGATIVE
Ketones, ur: NEGATIVE mg/dL
Nitrite: NEGATIVE
Specific Gravity, Urine: 1.024 (ref 1.005–1.030)
Urobilinogen, UA: 0.2 mg/dL (ref 0.0–1.0)
pH: 5.5 (ref 5.0–8.0)

## 2011-02-11 LAB — TYPE AND SCREEN: Antibody Screen: NEGATIVE

## 2011-02-11 LAB — COMPREHENSIVE METABOLIC PANEL
AST: 20 U/L (ref 0–37)
Albumin: 3.9 g/dL (ref 3.5–5.2)
BUN: 13 mg/dL (ref 6–23)
Calcium: 9.6 mg/dL (ref 8.4–10.5)
Chloride: 101 mEq/L (ref 96–112)
Creatinine, Ser: 0.53 mg/dL (ref 0.4–1.2)
GFR calc Af Amer: >60 mL/min (ref >60)
Total Bilirubin: 1.3 mg/dL — ABNORMAL HIGH (ref 0.3–1.2)
Total Protein: 6.6 g/dL (ref 6.0–8.3)

## 2011-02-11 LAB — BODY FLUID CULTURE: Culture: NO GROWTH

## 2011-02-11 LAB — URINE MICROSCOPIC-ADD ON

## 2011-02-11 LAB — APTT: aPTT: 29 seconds (ref 24–37)

## 2011-02-11 LAB — GRAM STAIN

## 2011-02-11 LAB — PROTIME-INR
INR: 0.99 (ref 0.00–1.49)
INR: 1.19 (ref 0.00–1.49)
INR: 1.99 — ABNORMAL HIGH (ref 0.00–1.49)
Prothrombin Time: 19.2 seconds — ABNORMAL HIGH (ref 11.6–15.2)
Prothrombin Time: 22.4 seconds — ABNORMAL HIGH (ref 11.6–15.2)

## 2011-02-11 LAB — ANAEROBIC CULTURE

## 2011-02-12 NOTE — Assessment & Plan Note (Signed)
Vital Signs:  Patient profile:   77 year old female Menstrual status:  hysterectomy Height:      62 inches Weight:      211 pounds BMI:     38.73 O2 Sat:      95 % on Room air Temp:     98.1 degrees F oral Pulse rate:   78 / minute Pulse rhythm:   regular Resp:     16 per minute BP sitting:   162 / 64  (left arm) Cuff size:   large  Vitals Entered By: Rock Nephew CMA (February 06, 2011 1:14 PM)  Nutrition Counseling: Patient's BMI is greater than 25 and therefore counseled on weight management options.  O2 Flow:  Room air CC: follow-up visit Is Patient Diabetic? Yes Did you bring your meter with you today? No Pain Assessment Patient in pain? no       Does patient need assistance? Functional Status Self care Ambulation Normal   Primary Care Provider:  Etta Grandchild MD  CC:  follow-up visit.  History of Present Illness:  Follow-Up Visit      This is a 77 year old woman who presents for Follow-up visit.  The patient denies chest pain, palpitations, dizziness, syncope, low blood sugar symptoms, high blood sugar symptoms, edema, SOB, DOE, PND, and orthopnea.  Since the last visit the patient notes no new problems or concerns.  The patient reports not taking meds as prescribed, monitoring BP, monitoring blood sugars, and dietary compliance.  When questioned about possible medication side effects, the patient notes none.    Preventive Screening-Counseling & Management  Alcohol-Tobacco     Alcohol drinks/day: 0     Alcohol Counseling: not indicated; patient does not drink     Smoking Status: never     Tobacco Counseling: not indicated; no tobacco use  Hep-HIV-STD-Contraception     Hepatitis Risk: no risk noted     HIV Risk: no risk noted     STD Risk: no risk noted     Dental Visit-last 6 months yes     Dental Care Counseling: to seek dental care; no dental care within six months     SBE monthly: yes     SBE Education/Counseling: to perform regular SBE   Drug Use:  no.    Clinical Review Panels:  Prevention   Last Mammogram:  normal (07/26/2010)   Last Colonoscopy:  normal (11/25/2008)  Immunizations   Last Tetanus Booster:  Adacel (04/25/2010)  Lipid Management   Cholesterol:  223 (12/03/2010)   HDL (good cholesterol):  76.00 (12/03/2010)  Diabetes Management   HgBA1C:  5.7 (12/03/2010)   Creatinine:  0.5 (12/03/2010)   Last Dilated Eye Exam:  normal (12/31/2010)   Last Foot Exam:  yes (02/06/2011)  CBC   WBC:  7.2 (12/03/2010)   RBC:  4.00 (12/03/2010)   Hgb:  12.8 (12/03/2010)   Hct:  38.0 (12/03/2010)   Platelets:  142.0 (12/03/2010)   MCV  95.0 (12/03/2010)   MCHC  33.7 (12/03/2010)   RDW  14.5 (12/03/2010)   PMN:  46.0 (12/03/2010)   Lymphs:  44.0 (12/03/2010)   Monos:  7.0 (12/03/2010)   Eosinophils:  2.5 (12/03/2010)   Basophil:  0.5 (12/03/2010)  Complete Metabolic Panel   Glucose:  84 (12/03/2010)   Sodium:  144 (12/03/2010)   Potassium:  4.1 (12/03/2010)   Chloride:  108 (12/03/2010)   CO2:  32 (12/03/2010)   BUN:  12 (12/03/2010)   Creatinine:  0.5 (12/03/2010)   Albumin:  3.5 (12/03/2010)   Total Protein:  6.3 (12/03/2010)   Calcium:  9.3 (12/03/2010)   Total Bili:  0.9 (12/03/2010)   Alk Phos:  53 (12/03/2010)   SGPT (ALT):  12 (12/03/2010)   SGOT (AST):  15 (12/03/2010)   Medications Prior to Update: 1)  Alprazolam 1 Mg Tabs (Alprazolam) .... Take 1 Tablet By Mouth Three Times A Day 2)  Seroquel 200 Mg Tabs (Quetiapine Fumarate) .... Take 1 Tab By Mouth At Bedtime 3)  Enalapril-Hydrochlorothiazide 5-12.5 Mg Tabs (Enalapril-Hydrochlorothiazide) .... Take 2 Tab Daily 4)  Amlodipine Besylate 10 Mg Tabs (Amlodipine Besylate) .... Take 1 Tablet By Mouth Once A Day 5)  Pravastatin Sodium 80 Mg Tabs (Pravastatin Sodium) .... One By Mouth Once Daily For Cholesterol 6)  Ketoconazole 2 % Crea (Ketoconazole) .... Apply Under Both Breasts Two Times A Day For 14 Days 7)  Vitamin D3 2000 Unit Caps  (Cholecalciferol) .... One By Mouth Once Daily  Current Medications (verified): 1)  Alprazolam 1 Mg Tabs (Alprazolam) .... Take 1 Tablet By Mouth Three Times A Day 2)  Seroquel 200 Mg Tabs (Quetiapine Fumarate) .... Take 1 Tab By Mouth At Bedtime 3)  Enalapril-Hydrochlorothiazide 5-12.5 Mg Tabs (Enalapril-Hydrochlorothiazide) .... Take 2 Tab Daily 4)  Amlodipine Besylate 10 Mg Tabs (Amlodipine Besylate) .... Take 1 Tablet By Mouth Once A Day 5)  Pravastatin Sodium 80 Mg Tabs (Pravastatin Sodium) .... One By Mouth Once Daily For Cholesterol 6)  Ketoconazole 2 % Crea (Ketoconazole) .... Apply Under Both Breasts Two Times A Day For 14 Days 7)  Vitamin D3 2000 Unit Caps (Cholecalciferol) .... One By Mouth Once Daily  Allergies (verified): 1)  ! Metformin Hcl  Past History:  Past Medical History: Last updated: 12/03/2010 Anemia-NOS Anxiety Depression Diabetes mellitus, type II Hyperlipidemia Hypertension Osteoarthritis  Past Surgical History: Last updated: 12/03/2010 Appendectomy Cholecystectomy Hysterectomy Tonsillectomy  Family History: Last updated: 12/03/2010 Family History of Alcoholism/Addiction Family History Hypertension Family History Kidney disease Family History of Mental/Emotional Illness  Social History: Last updated: 12/03/2010 Retired Widow/Widower Never Smoked Alcohol use-no Drug use-no Regular exercise-yes  Risk Factors: Alcohol Use: 0 (02/06/2011) Exercise: yes (12/03/2010)  Risk Factors: Smoking Status: never (02/06/2011)  Family History: Reviewed history from 12/03/2010 and no changes required. Family History of Alcoholism/Addiction Family History Hypertension Family History Kidney disease Family History of Mental/Emotional Illness  Social History: Reviewed history from 12/03/2010 and no changes required. 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  The patient denies anorexia, fever, weight loss, chest pain, syncope, dyspnea on exertion, peripheral edema, prolonged cough, headaches, hemoptysis, abdominal pain, hematuria, muscle weakness, suspicious skin lesions, unusual weight change, abnormal bleeding, and enlarged lymph nodes.   Psych:  Denies alternate hallucination ( auditory/visual), anxiety, depression, easily angered, easily tearful, irritability, mental problems, panic attacks, sense of great danger, suicidal thoughts/plans, thoughts of violence, unusual visions or sounds, and thoughts /plans of harming others.  Physical Exam  General:  alert, well-developed, well-nourished, well-hydrated, appropriate dress, normal appearance, cooperative to examination, good hygiene, and overweight-appearing.   Head:  normocephalic, atraumatic, no abnormalities observed, and no abnormalities palpated.   Mouth:  Oral mucosa and oropharynx without lesions or exudates.  Teeth in good repair. Neck:  supple, full ROM, no masses, no thyromegaly, no JVD, normal carotid upstroke, no carotid bruits, and no cervical lymphadenopathy.   Lungs:  normal respiratory effort,  no intercostal retractions, no accessory muscle use, normal breath sounds, no dullness, no fremitus, no crackles, and no wheezes.   Heart:  normal rate, regular rhythm, no murmur, no gallop, no rub, and no JVD.   Abdomen:  soft, non-tender, normal bowel sounds, no distention, no masses, no guarding, no rigidity, no rebound tenderness, no abdominal hernia, no inguinal hernia, no hepatomegaly, no splenomegaly, and abdominal scar(s).   Msk:  enlarged MCP joints, enlarged PIP joints, and enlarged DIP joints.   Pulses:  R and L carotid,radial,femoral,dorsalis pedis and posterior tibial pulses are full and equal bilaterally Extremities:  No clubbing, cyanosis, edema, or deformity noted with normal full range of motion of all joints.   Neurologic:  No cranial nerve deficits  noted. Station and gait are normal. Plantar reflexes are down-going bilaterally. DTRs are symmetrical throughout. Sensory, motor and coordinative functions appear intact. Skin:  under both breasts she has scattered scaly hyperpigemented macules with sharp borders, turgor normal, color normal, no rashes, no suspicious lesions, no ecchymoses, no petechiae, no purpura, no ulcerations, and no edema.   Cervical Nodes:  no anterior cervical adenopathy and no posterior cervical adenopathy.   Psych:  Cognition and judgment appear intact. Alert and cooperative with normal attention span and concentration. No apparent delusions, illusions, hallucinations  Diabetes Management Exam:    Foot Exam (with socks and/or shoes not present):       Sensory-Pinprick/Light touch:          Left medial foot (L-4): normal          Left dorsal foot (L-5): normal          Left lateral foot (S-1): normal          Right medial foot (L-4): normal          Right dorsal foot (L-5): normal          Right lateral foot (S-1): normal       Sensory-Monofilament:          Left foot: normal          Right foot: normal       Inspection:          Left foot: normal          Right foot: normal       Nails:          Left foot: normal          Right foot: normal   Impression & Recommendations:  Problem # 1:  HYPERTENSION (ICD-401.9) Assessment Unchanged She will improve compliance with meds to attain better blood pressure control  Her updated medication list for this problem includes:    Enalapril-hydrochlorothiazide 5-12.5 Mg Tabs (Enalapril-hydrochlorothiazide) .Marland Kitchen... Take 2 tab daily    Amlodipine Besylate 10 Mg Tabs (Amlodipine besylate) .Marland Kitchen... Take 1 tablet by mouth once a day  BP today: 162/64 Prior BP: 140/72 (12/03/2010)  Labs Reviewed: K+: 4.1 (12/03/2010) Creat: : 0.5 (12/03/2010)   Chol: 223 (12/03/2010)   HDL: 76.00 (12/03/2010)   TG: 226.0 (12/03/2010)  Problem # 2:  DIABETES MELLITUS, TYPE II  (ICD-250.00) Assessment: Unchanged  Her updated medication list for this problem includes:    Enalapril-hydrochlorothiazide 5-12.5 Mg Tabs (Enalapril-hydrochlorothiazide) .Marland Kitchen... Take 2 tab daily  Labs Reviewed: Creat: 0.5 (12/03/2010)     Last Eye Exam: normal (12/31/2010) Reviewed HgBA1c results: 5.7 (12/03/2010)  Problem # 3:  HYPERLIPIDEMIA (ICD-272.4) Assessment: Unchanged  Her updated medication list for this problem includes:    Pravastatin Sodium 80  Mg Tabs (Pravastatin sodium) ..... One by mouth once daily for cholesterol  Labs Reviewed: SGOT: 15 (12/03/2010)   SGPT: 12 (12/03/2010)   HDL:76.00 (12/03/2010)  Chol:223 (12/03/2010)  Trig:226.0 (12/03/2010)  Problem # 4:  DEPRESSION (ICD-311) Assessment: Improved  Her updated medication list for this problem includes:    Alprazolam 1 Mg Tabs (Alprazolam) .Marland Kitchen... Take 1 tablet by mouth three times a day  Discussed treatment options, including trial of antidpressant medication. Will refer to behavioral health. Follow-up call in in 24-48 hours and recheck in 2 weeks, sooner as needed. Patient agrees to call if any worsening of symptoms or thoughts of doing harm arise. Verified that the patient has no suicidal ideation at this time.   Complete Medication List: 1)  Alprazolam 1 Mg Tabs (Alprazolam) .... Take 1 tablet by mouth three times a day 2)  Seroquel 200 Mg Tabs (Quetiapine fumarate) .... Take 1 tab by mouth at bedtime 3)  Enalapril-hydrochlorothiazide 5-12.5 Mg Tabs (Enalapril-hydrochlorothiazide) .... Take 2 tab daily 4)  Amlodipine Besylate 10 Mg Tabs (Amlodipine besylate) .... Take 1 tablet by mouth once a day 5)  Pravastatin Sodium 80 Mg Tabs (Pravastatin sodium) .... One by mouth once daily for cholesterol 6)  Ketoconazole 2 % Crea (Ketoconazole) .... Apply under both breasts two times a day for 14 days 7)  Vitamin D3 2000 Unit Caps (Cholecalciferol) .... One by mouth once daily  Patient Instructions: 1)  Please  schedule a follow-up appointment in 2 months. 2)  It is important that you exercise regularly at least 20 minutes 5 times a week. If you develop chest pain, have severe difficulty breathing, or feel very tired , stop exercising immediately and seek medical attention. 3)  You need to lose weight. Consider a lower calorie diet and regular exercise.  4)  Check your blood sugars regularly. If your readings are usually above 200 or below 70 you should contact our office. 5)  It is important that your Diabetic A1c level is checked every 3 months. 6)  See your eye doctor yearly to check for diabetic eye damage. 7)  Check your feet each night for sore areas, calluses or signs of infection. 8)  Check your Blood Pressure regularly. If it is above 130/80: you should make an appointment. Prescriptions: VITAMIN D3 2000 UNIT CAPS (CHOLECALCIFEROL) One by mouth once daily  #30 x 11   Entered by:   Rock Nephew CMA   Authorized by:   Etta Grandchild MD   Signed by:   Rock Nephew CMA on 02/06/2011   Method used:   Electronically to        [facility] (retail)       [address]       [place], [place]  [id number]       Ph: [phone]       Fax: [phone]   RxID:   [id number] AMLODIPINE BESYLATE 10 MG TABS (AMLODIPINE BESYLATE) Take 1 tablet by mouth once a day  #30 x 11   Entered and Authorized by:   Etta Grandchild MD   Signed by:   Etta Grandchild MD on 02/06/2011   Method used:   Electronically to        [facility] (retail)       [address]       [place], [place]  [id number]       Ph: [phone]       Fax: [phone]  RxID:   [id number] SEROQUEL 200 MG TABS (QUETIAPINE FUMARATE) Take 1 tab by mouth at bedtime  #30 x 11   Entered and Authorized by:   Etta Grandchild MD   Signed by:   Etta Grandchild MD on 02/06/2011   Method used:   Electronically to        [facility] (retail)       [address]       [place], [place]  [address]       Ph: [phone]       Fax: [phone]   RxID:   [id number]    Orders Added: 1)  Est. Patient Level III [[id number]]

## 2011-04-09 NOTE — Op Note (Signed)
[name], MEININGER NO.:  [id number]   MEDICAL RECORD NO.:  [id number]          PATIENT TYPE:  OIB   LOCATION:  5023                         FACILITY:  [facility]   PHYSICIAN:  Myrtie Neither, MD      DATE OF BIRTH:  [birth year]   DATE OF PROCEDURE:  05/17/2008  DATE OF DISCHARGE:                               OPERATIVE REPORT   PREOPERATIVE DIAGNOSIS:  Impingement syndrome, left shoulder.   POSTOPERATIVE DIAGNOSES:  1. Impingement syndrome, left shoulder.  2. Rotator cuff tendinopathy and chronic synovitis, left shoulder.   ANESTHESIA:  General.   PROCEDURE:  1. Arthroscopic synovectomy.  2. Acromioplasty and excision of osteoarthritis, left shoulder.   PROCEDURE IN DETAIL:  The patient was taken to the operating room and  was given adequate preop medications, given general anesthesia and was  intubated.  The patient was placed in barber-chair position.  Left  shoulder was prepped with DuraPrep and draped in a sterile manner.  A  1/2-inch puncture wound made along the lateral and 2 posterior incision.  Inspection of the joint revealed marked tendinopathy on the rotator  cuff, but not a complete cuff tear.  Complete debridement of the  subacromial bursa sac was done and acromioplasty was done for further  decompression anteriorly and laterally.  Osteophytes were resected about  the edges of the acromion.  After adequate decompression and  synovectomy, would closure was then done with 4-0 nylon, and 14 mL of  0.5% Marcaine was injected into the joint.  Compressive dressing was  applied and the sling was applied.  The patient tolerated the quite well  and taken to recovery room in stable and satisfactory condition.  The  patient is being kept on 2-3 hour observation for pain control and will  be discharged on Percocet 1-2 q.4 h for pain.  Ice packs and return to  the office in 1 week.      Myrtie Neither, MD  Electronically Signed     AC/MEDQ  D:   05/17/2008  T:  05/18/2008  Job:  [id number]

## 2011-04-10 ENCOUNTER — Ambulatory Visit: Payer: Medicare PPO | Admitting: Internal Medicine

## 2011-04-11 ENCOUNTER — Encounter: Payer: Self-pay | Admitting: Internal Medicine

## 2011-04-12 NOTE — Op Note (Signed)
   NAME:  [name], [name]                              ACCOUNT NO.:  [id number]   MEDICAL RECORD NO.:  [id number]                   PATIENT TYPE:  AMB   LOCATION:  ENDO                                 FACILITY:  [facility]   PHYSICIAN:  Anselmo Rod, M.D.               DATE OF BIRTH:  [birth year]   DATE OF PROCEDURE:  04/18/2003  DATE OF DISCHARGE:                                 OPERATIVE REPORT   PROCEDURES:  Screening colonoscopy.   ENDOSCOPIST:  Charna Elizabeth, M.D.   INSTRUMENT USED:  Olympus video colonoscope.   INDICATIONS FOR PROCEDURE:  A 77 year old African-American female undergoing  screening colonoscopy to rule out colonic polyps, masses, hemorrhoids, etc.   PREPROCEDURE PREPARATION:  Informed consent was procured from the patient.  The patient was fasted for 8 hr prior to the procedure and prepped with a  bottle of magnesium citrate and a gallon of GoLYTELY the night prior to the  procedure.   PREPROCEDURE PHYSICAL:  VITAL SIGNS:  Stable.  NECK:  Supple.  CHEST:  Clear to auscultation.  HEART:  S1, S2 normal.  ABDOMEN:  Soft, with normal bowel sounds.   DESCRIPTION OF PROCEDURE:  The patient was placed in the left lateral  decubitus position and sedated with additional 20 mg of Demerol and 2 mg  Versed intravenously.  Once the patient was adequately sedated and  maintained on low-flow oxygen and continuous cardiac monitoring, the Olympus  video colonoscope was advanced into the rectum to the cecum without  difficulty.  There was some residual stool in the colon; multiple washings  were done.  No masses, polyps, lesions or ulcerations were seen.  A few  scattered diverticula were seen (about two or three).  Small internal  hemorrhoids were appreciated on retroflexion of the rectum.   IMPRESSION:  1. Essentially normal colonoscopy, except for small internal hemorrhoids and     a few scattered diverticula.  2. Some residual stool in the colon.  Very small lesions could have  been     missed.   RECOMMENDATIONS:  1. Outpatient follow-up is advised in the next two weeks.  2. A high-fiber diet has been recommended for the patient.  3. Liberal fluid intake has been advocated.  4. Further recommendation made in follow-up.                                               Anselmo Rod, M.D.   JNM/MEDQ  D:  04/18/2003  T:  04/19/2003  Job:  [id number]

## 2011-04-12 NOTE — Op Note (Signed)
NAMEROREY, HODGES NO.:  [id number]   MEDICAL RECORD NO.:  [id number]          PATIENT TYPE:  OIB   LOCATION:  5016                         FACILITY:  [facility]   PHYSICIAN:  Myrtie Neither, MD      DATE OF BIRTH:  22-Nov-1933   DATE OF PROCEDURE:  03/13/2007  DATE OF DISCHARGE:  03/14/2007                               OPERATIVE REPORT   PREOPERATIVE DIAGNOSIS:  Impingement syndrome, left shoulder, possible  rotator cuff tear.   POSTOPERATIVE DIAGNOSIS:  Impingement syndrome, left shoulder, with  rotator cuff tendopathy and hypertrophic subacromial bursal sac.   ANESTHESIA:  General.   PROCEDURE:  Arthroscopic synovectomy, decompression, and acromioplasty  left shoulder.   The patient was taken to the operating room. After given adequate preop  medications, given general anesthesia and intubated. The patient was  placed in the barber chair position.  The left shoulder was prepped with  DuraPrep and draped in a sterile manner.  A 1/2 inch puncture wound was  made in the posterior aspect of the left shoulder.  A Swisher rod was  placed posterior to anterior.  An anterior incision was then made for  water inflow.  A separate lateral incision was made for the shaver.   Inspection of the joint revealed complete loss of subacromial cyst space  with eburnation and degenerative changes of the articular surface of the  subacromial surface, hypertrophic changes of the subacromial bursal sac.  The rotator cuff was cut into and not through, no through-and-through  tear of the cuff was noted.  With the use of the synovial shaver,  complete synovectomy was done followed by acromioplasty and  decompression of the joint.  Acromioplasty was done with both the shaver  and a bur.  After adequate decompression, the subacromial space was  reobtained.  Copious and abundant irrigation was done.  Further  inspection of the rotator cuff revealed that it was still intact.  Wound  closure was then done with the use of 4-0 nylon. Compressive dressings  were applied.  Immobilizing sling was applied.  The patient tolerated  the procedure quite well and went to the recovery room in stable and  satisfactory patient.      Myrtie Neither, MD  Electronically Signed     AC/MEDQ  D:  04/14/2007  T:  04/14/2007  Job:  [id number]

## 2011-04-12 NOTE — Op Note (Signed)
[name], [name] NO.:  [id number]   MEDICAL RECORD NO.:  [id number]          PATIENT TYPE:  OIB   LOCATION:  2550                         FACILITY:  [facility]   PHYSICIAN:  Myrtie Neither, MD      DATE OF BIRTH:  Apr 23, 1933   DATE OF PROCEDURE:  03/13/2007  DATE OF DISCHARGE:                               OPERATIVE REPORT   PREOPERATIVE DIAGNOSIS:  Impingement syndrome, left shoulder, possible  rotator cuff tear.   POSTOPERATIVE DIAGNOSIS:  Impingement syndrome, left shoulder, rotator  cuff tenopathy.   ANESTHESIA:  General.   PROCEDURE:  Arthroscopic acromioplasty and decompression and synovectomy  left shoulder.   The patient was taken to the operating room after given adequate  preoperative medication, general anesthesia was intubated.  The left  shoulder was prepped with DuraPrep and draped in a sterile manner.  The  patient was placed in barber's chair position.  One-half inch puncture  wound made posteriorly.  Switcher rod was placed anterior and posterior  to anterior.  Difficulty in visibility made it not possible to use the  anterior inflow outlet.  Inflow of water through the arthroscope.  The  patient's subacromial space was completely impacted up against the  acromion, and shoulder distraction with downward force was necessary in  order to get the subacromial space opened.  Acromion had a very sharp  anterolateral dip.  Extreme compression against the rotator cuff  demonstrated a lot of fibrotic scarring as well as hypertrophic buildup  of subacromial bursal sac.  With the synovial shaver, complete  synovectomy was done.  Afterwards, acromioplasty was done with the use  of a bur and decompression with release of the acromiohumeral ligament.  After extensive debridement and decompression, further inspection of the  shoulder did not reveal any through-and-through tear of the cuff.  Copious irrigation was done.  Wound closure was then done with the  use  of 4-0 nylon.  Compressive dressing applied, and mobilizing sling was  applied.  The patient tolerated the procedure quite well and went to the  recovery room in stable and satisfactory condition.  The patient is  being discharged with ice packs, use of sling, and Percocet 1 q.4h.  p.r.n. for pain.  The patient is to return to the office in 1 week.  The  patient is being discharged in stable and satisfactory condition.      Myrtie Neither, MD  Electronically Signed     AC/MEDQ  D:  03/13/2007  T:  03/13/2007  Job:  [id number]

## 2011-04-12 NOTE — Op Note (Signed)
NAME:  [name], [name]                              ACCOUNT NO.:  [id number]   MEDICAL RECORD NO.:  [id number]                   PATIENT TYPE:  AMB   LOCATION:  ENDO                                 FACILITY:  [facility]   PHYSICIAN:  Anselmo Rod, M.D.               DATE OF BIRTH:  08/10/1933   DATE OF PROCEDURE:  04/18/2003  DATE OF DISCHARGE:                                 OPERATIVE REPORT   PROCEDURE PERFORMED:  Esophagogastroduodenoscopy.   ENDOSCOPIST:  Charna Elizabeth, M.D.   INSTRUMENT USED:  Olympus video panendoscope.   INDICATIONS FOR PROCEDURE:  The patient is a 77 year old African-American  female undergoing an esophagogastroduodenoscopy because of epigastric pain,  rule out peptic ulcer disease.   PREPROCEDURE PREPARATION:  Informed consent was procured from the patient.  The patient was fasted for eight hours prior to the procedure.   PREPROCEDURE PHYSICAL:  The patient had stable vital signs.  Neck supple,  chest clear to auscultation.  S1, S2 regular.  Abdomen soft with normal  bowel sounds.  Epigastric tenderness on palpation with guarding.   DESCRIPTION OF PROCEDURE:  The patient was placed in the left lateral  decubitus position and sedated with 100 mg of Demerol and 10 mg of Versed  intravenously.  Once the patient was adequately sedated and maintained on  low-flow oxygen and continuous cardiac monitoring, the Olympus video  panendoscope was advanced through the mouth piece over the tongue into the  esophagus under direct vision.  The entire esophagus appeared normal with no  evidence of ring, stricture, masses, esophagitis or Barrett's mucosa.  The  scope was then advanced to the stomach.  Multiple superficial antral  erosions were seen and biopsies were done for Helicobacter pylori by  pathology.  Retroflexion in the high cardia revealed no abnormalities.  The  duodenal bulb and the proximal small bowel distal to the bulb appeared  normal.   IMPRESSION:  1.  Multiple superficial antral ulcers, biopsies done, results pending.  2. Normal-appearing esophagus and proximal small bowel.   RECOMMENDATIONS:  1. Continue proton pump inhibitors, samples have been given to her from the     office for Protonix 40 mg one by mouth daily.  2.     Avoid nonsteroidals including aspirin.  3. Treat with antibiotics if Helicobacter pylori present on biopsies.  4. Proceed with a colonoscopy at this time.                                                 Anselmo Rod, M.D.    JNM/MEDQ  D:  04/18/2003  T:  04/19/2003  Job:  [id number]   cc:   Georgianne Fick, M.D.  [address]  [place]  [place] [address]  Fax: [phone]

## 2011-04-12 NOTE — Discharge Summary (Signed)
[name], [name] NO.:  [id number]   MEDICAL RECORD NO.:  [id number]                   PATIENT TYPE:  IPS   LOCATION:  0508                                 [facility]   PHYSICIAN:  Jeanice Lim, M.D.              DATE OF BIRTH:  [birth year]   DATE OF ADMISSION:  03/21/2003  DATE OF DISCHARGE:  03/26/2003                                 DISCHARGE SUMMARY   IDENTIFYING DATA:  This is a 77 year old married Caucasian female,  voluntarily admitted, with a history of narcotic and benzodiazepine abuse,  using up to 8 Lortab a day for 7 years and Valium 10 mg approximately for 1-  2 months.  She was tired of the rat race, wanting to stop her narcotic and  benzodiazepine abuse and reports multiple stressors.   ADMISSION MEDICATIONS:  Adalat 60 mg q.a.m.   ALLERGIES:  No known drug allergies.   PHYSICAL EXAMINATION:  Essentially within normal limits, neurologically  nonfocal.   ROUTINE ADMISSION LABS:  Essentially within normal limits.  Acetaminophen  level 17 with __________.  Urine drug screen positive for opiates and  benzodiazepines.   MENTAL STATUS EXAM:  Alert, obese, disheveled, elderly female, mood anxious.  Mood was dysphoric.  Affect restricted, thought process goal directed,  thought content negative for dangerous ideations or psychotic symptoms.  The  patient reported some withdrawal symptoms and mild agitation.  Cognition  intact.  Judgment and insight fair.   ADMISSION DIAGNOSES:   AXIS I:  1. Opiate dependence.  2. Benzodiazepine abuse.  3. Anxiety disorder not otherwise specified.   AXIS II:  Deferred.   AXIS III:  Hypertension.   AXIS IV:  Moderate.  Problems with primary support group.   AXIS V:  35/60.   HOSPITAL COURSE:  The patient was admitted and ordered routine p.r.n.  medications, underwent further monitoring, and was encouraged to participate  in individual, group and milieu therapy.  The patient was  motivated to be  detoxed off medications and was placed on Wytensin detox protocol and low-  dose Librium  to be detoxed off of benzodiazepines.  The patient tolerated  detox medications and had no complications from withdrawal, felt her mood  was improving.  She was optimized on Celexa.   CONDITION ON DISCHARGE:  Markedly improved.  Mood was more euthymic, affect  brighter, thought process goal directed.  Thought content negative for  dangerous ideation or psychotic symptoms.  The patient reported motivation  to be compliant with the aftercare plan, to remain free from opiates and  benzodiazepines.   DISCHARGE MEDICATIONS:  1. Levothyroxine 100 mcg q.a.m.  2. Procardia XL 60 mg q.a.m.  3. Seroquel 25 mg q.h.s.  4. Celexa 20 mg q.a.m.  5. Neurontin 100 mg t.i.d.   DISPOSITION:  The patient was to follow up with Dr. Lourdes Sledge May 24 at  11:45.  DISCHARGE DIAGNOSES:   AXIS I:  1. Opiate dependence.  2. Benzodiazepine abuse.  3. Anxiety disorder not otherwise specified.   AXIS II:  Deferred.   AXIS III:  Hypertension.   AXIS IV:  Moderate.  Problems with primary support group.   AXIS V:  Global assessment of function on discharge was 55.                                                 Jeanice Lim, M.D.    JEM/MEDQ  D:  04/14/2003  T:  04/15/2003  Job:  [id number]

## 2011-04-12 NOTE — H&P (Signed)
[name], [name] NO.:  [id number]   MEDICAL RECORD NO.:  [id number]                   PATIENT TYPE:  IPS   LOCATION:  0508                                 [facility]   PHYSICIAN:  Jeanice Lim, M.D.              DATE OF BIRTH:  Feb 17, 1933   DATE OF ADMISSION:  03/21/2003  DATE OF DISCHARGE:                         PSYCHIATRIC ADMISSION ASSESSMENT   IDENTIFYING INFORMATION:  This is a 77 year old married white female  voluntarily admitted March 21, 2003.   HISTORY OF PRESENT ILLNESS:  The patient presents with a history of narcotic  and benzodiazepine abuse.  Has been using Lortab, up to eight per day, for  seven years.  Using Valium 10 mg tabs for approximately one month.  She  states the Valium was a prescription drug.  Has been obtaining the Lortab  doctor-shopping and she states she wants to be detoxed because she is just  tired of the rat race.  She feels like she has no energy.  Has no interest  in things.  Having difficulty sleeping as well but she reports that is due  to her husband's illness.  He has a history of Alzheimer's.  Her last  benzodiazepine and narcotic use was on Saturday prior to this admission.  Her appetite has been fair.  She denies any depressive symptoms, suicidal or  homicidal ideation, does report some anxiety.  Denies any psychotic  symptoms.   PAST PSYCHIATRIC HISTORY:  First hospitalization to [facility].  No outpatient treatment.  She was hospitalized at [facility] in the  past and was detoxed from opiates years ago.   SOCIAL HISTORY:  This is a 77 year old married white female, married for 48  years, five children and all adults.  She lives with her husband, who has a  history of Alzheimer's disease.  She is unemployed.  She is on disability.  She reports no legal charges.   FAMILY HISTORY:  The patient denies.   ALCOHOL/DRUG HISTORY:  Nonsmoker.  Denies any alcohol use.  Has a history  of  a seizure after stopping Valium in the past.   PRIMARY CARE PHYSICIAN:  Dr. Lorelei Pont in [place].   MEDICAL PROBLEMS:  Hypertension.   MEDICATIONS:  Adalat 60 mg q.a.m.   ALLERGIES:  No known allergies.   PHYSICAL EXAMINATION:  Performed with no acute findings.  The patient is  obese but in generally good health in relation to her age with a history of  hypertension.  Her vital signs available were:  The patient weighs 232  pounds.  She is 5 feet 2 inches tall.  Temperature 98.9, heart rate 87,  respirations 18, blood pressure 162/94.   LABORATORY DATA:  Her blood sugar this morning was 124.  Acetaminophen level  17.  Salicylate level 4.  Urine drug screen is positive for opiates,  positive for benzodiazepines.   MENTAL  STATUS EXAMINATION:  She is an alert, obese, disheveled,  elderly  female.  Speech is clear.  Mood is anxious.  The patient is flat with some  mild anxiety apparent.  Thought processes are coherent.  No evidence of  psychosis.  No auditory or visual hallucinations.  No flight of ideas.  No  suicidal or homicidal ideation.  No paranoid ideation.  Cognitive function  intact.  Memory is fair.  Judgment and insight are fair.   DIAGNOSES:   AXIS I:  1. Opiate dependence.  2. Benzodiazepine abuse.  3. Anxiety disorder not otherwise specified.   AXIS II:  Deferred.   AXIS III:  Hypertension.   AXIS IV:  Problems with primary support group, husband's illness, primary  caretaker, other psychosocial problems related to her substance abuse.   AXIS V:  Current 35; estimated this past year 25.   PLAN:  Voluntary admission for opiate dependence, benzodiazepine abuse.  Contract for safety.  Check every 15 minutes.  Will initiate the low-dose  Librium and Wytensin protocol to detox safely.  Will resume her  antihypertensive medications and monitor blood pressure closely.  Will  initiate an antidepressant to alleviate anxiety symptoms.  Medication  compliance  is discussed.  The patient to follow up with mental health.   TENTATIVE LENGTH OF STAY:  Three to five days.     Landry Corporal, N.P.                       Jeanice Lim, M.D.    JO/MEDQ  D:  03/24/2003  T:  03/24/2003  Job:  [id number]

## 2011-04-12 NOTE — Op Note (Signed)
NAME:  [name], [name] NO.:  [id number]   MEDICAL RECORD NO.:  [id number]          PATIENT TYPE:  AMB   LOCATION:  DAY                          FACILITY:  [facility]   PHYSICIAN:  Myrtie Neither, MD      DATE OF BIRTH:  August 16, 1933   DATE OF PROCEDURE:  02/20/2006  DATE OF DISCHARGE:                                 OPERATIVE REPORT   PREOPERATIVE DIAGNOSIS:  Internal derangement, right knee.   POSTOPERATIVE DIAGNOSES:  1.  Chondral defect lateral femoral condyle.  2.  Lateral meniscal tear.  3.  Patella plica.  4.  Chronic synovitis.   ANESTHESIA:  General.   PROCEDURE:  1.  Arthroscopic lateral meniscectomy.  2.  Excision of plica.  3.  Synovectomy.  4.  Chondroplasty lateral femoral condyle.   The patient was taken to the operating room after given adequate  preoperative medication, given general anesthesia and intubated.  The right  knee was prepped with DuraPrep and draped in a sterile manner.  Tourniquet  used for hemostasis.  A 1/2 inch puncture wound was made along the anterior  medial and lateral joint line.  Inflow of water through the medial  suprapatellar pouch area, inspection of the joint revealed degenerative tear  with areas of uric acid involving the lateral meniscus, thick hypertrophic  plica, and chondral defect appendix the size of a 25 cent piece about the  lateral femoral condyle.  ACL was intact.  With synovial shaver, synovectomy  was done, both medial and lateral and excision of plica.  Chondroplasty was  done of the lateral femoral condyle using basket forceps, lateral  meniscectomy was done.  Copious and abundant irrigation was done.  Wound  closure was then done with 4-0 nylon; 15 mL of 0.5% Marcaine was injected  into the knee.  Compressive dressing was applied.  The patient tolerated the  procedure quite well and went to the recovery room in stable and  satisfactory condition.  The patient is being discharged home, partial  weightbearing on the right side with use of walker or crutches, Percocet 5  mg q.6h. p.r.n. for pain, ice packs, elevation, return to the office in 1  week.  The patient is being discharged in stable and satisfactory condition.      Myrtie Neither, MD  Electronically Signed     AC/MEDQ  D:  02/20/2006  T:  02/21/2006  Job:  [id number]

## 2011-04-15 ENCOUNTER — Other Ambulatory Visit (INDEPENDENT_AMBULATORY_CARE_PROVIDER_SITE_OTHER): Payer: Medicare PPO | Admitting: Internal Medicine

## 2011-04-15 ENCOUNTER — Encounter: Payer: Self-pay | Admitting: Internal Medicine

## 2011-04-15 ENCOUNTER — Other Ambulatory Visit (INDEPENDENT_AMBULATORY_CARE_PROVIDER_SITE_OTHER): Payer: Medicare PPO

## 2011-04-15 ENCOUNTER — Ambulatory Visit (INDEPENDENT_AMBULATORY_CARE_PROVIDER_SITE_OTHER): Payer: Medicare PPO | Admitting: Internal Medicine

## 2011-04-15 VITALS — BP 110/64 | HR 93 | Temp 98.9°F | Resp 16

## 2011-04-15 DIAGNOSIS — E785 Hyperlipidemia, unspecified: Secondary | ICD-10-CM

## 2011-04-15 DIAGNOSIS — E559 Vitamin D deficiency, unspecified: Secondary | ICD-10-CM

## 2011-04-15 DIAGNOSIS — R42 Dizziness and giddiness: Secondary | ICD-10-CM

## 2011-04-15 DIAGNOSIS — D649 Anemia, unspecified: Secondary | ICD-10-CM

## 2011-04-15 DIAGNOSIS — Z13220 Encounter for screening for lipoid disorders: Secondary | ICD-10-CM

## 2011-04-15 DIAGNOSIS — E119 Type 2 diabetes mellitus without complications: Secondary | ICD-10-CM

## 2011-04-15 DIAGNOSIS — F3289 Other specified depressive episodes: Secondary | ICD-10-CM

## 2011-04-15 DIAGNOSIS — I10 Essential (primary) hypertension: Secondary | ICD-10-CM

## 2011-04-15 DIAGNOSIS — F329 Major depressive disorder, single episode, unspecified: Secondary | ICD-10-CM

## 2011-04-15 LAB — COMPREHENSIVE METABOLIC PANEL
CO2: 30 mEq/L (ref 19–32)
Calcium: 9.6 mg/dL (ref 8.4–10.5)
Chloride: 98 mEq/L (ref 96–112)
Creatinine, Ser: 0.5 mg/dL (ref 0.4–1.2)
GFR: 169.78 mL/min (ref >60.00)
Glucose, Bld: 84 mg/dL (ref 70–99)
Total Bilirubin: 0.9 mg/dL (ref 0.3–1.2)

## 2011-04-15 LAB — CBC WITH DIFFERENTIAL/PLATELET
Basophils Relative: 0.3 % (ref 0.0–3.0)
Eosinophils Absolute: 0.4 10*3/uL (ref 0.0–0.7)
Eosinophils Relative: 4 % (ref 0.0–5.0)
HCT: 37.8 % (ref 36.0–46.0)
Lymphs Abs: 3 10*3/uL (ref 0.7–4.0)
MCHC: 33.6 g/dL (ref 30.0–36.0)
MCV: 94.5 fl (ref 78.0–100.0)
Monocytes Absolute: 0.5 10*3/uL (ref 0.1–1.0)
Neutro Abs: 5.2 10*3/uL (ref 1.4–7.7)
Neutrophils Relative %: 57.1 % (ref 43.0–77.0)
RBC: 4.01 Mil/uL (ref 3.87–5.11)

## 2011-04-15 LAB — LIPID PANEL: Total CHOL/HDL Ratio: 3

## 2011-04-15 LAB — URINALYSIS, ROUTINE W REFLEX MICROSCOPIC
Bilirubin Urine: NEGATIVE
Nitrite: NEGATIVE
Urine Glucose: NEGATIVE
Urobilinogen, UA: 0.2 (ref 0.0–1.0)

## 2011-04-15 LAB — LDL CHOLESTEROL, DIRECT: Direct LDL: 150.1 mg/dL

## 2011-04-15 LAB — TSH: TSH: 4.07 u[IU]/mL (ref 0.35–5.50)

## 2011-04-15 MED ORDER — MECLIZINE HCL 12.5 MG PO TABS: 12.5000 mg | ORAL_TABLET | Freq: Three times a day (TID) | ORAL | Status: DC | PRN

## 2011-04-15 MED ORDER — TRAZODONE HCL 100 MG PO TABS: 100.0000 mg | ORAL_TABLET | Freq: Every day | ORAL | Status: DC

## 2011-04-15 NOTE — Assessment & Plan Note (Signed)
I will check her Vitamin D level today 

## 2011-04-15 NOTE — Assessment & Plan Note (Signed)
It sounds like her BS is well controlled, today I will check her A1C and monitor her renal function

## 2011-04-15 NOTE — Assessment & Plan Note (Signed)
Her BP is well controlled, today I will monitor her lytes and renal function 

## 2011-04-15 NOTE — Assessment & Plan Note (Signed)
She is doing well on pravastatin, I will check her FLP and LFT's today

## 2011-04-15 NOTE — Patient Instructions (Signed)
Diabetes, Type 2 Diabetes is a lasting (chronic) disease. In type 2 diabetes, the pancreas does not make enough insulin (a hormone), and the body does not respond normally to the insulin that is made. This type of diabetes was also previously called adult onset diabetes. About 90% of all those who have diabetes have type 2. It usually occurs after the age of 40 but can occur at any age. CAUSES Unlike type 1 diabetes, which happens because insulin is no longer being made, type 2 diabetes happens because the body is making less insulin and has trouble using the insulin properly. SYMPTOMS  Drinking more than usual.   Urinating more than usual.   Blurred vision.   Dry, itchy skin.   Frequent infection like yeast infections in women.   More tired than usual (fatigue).  TREATMENT  Healthy eating.   Exercise.   Medication, if needed.   Monitoring blood glucose (sugar).   Seeing your caregiver regularly.  HOME CARE INSTRUCTIONS  Check your blood glucose (sugar) at least once daily. More frequent monitoring may be necessary, depending on your medications and on how well your diabetes is controlled. Your caregiver will advise you.   Take your medicine as directed by your caregiver.   Do not smoke.   Make wise food choices. Ask your caregiver for information. Weight loss can improve your diabetes.   Learn about low blood glucose (hypoglycemia) and how to treat it.   Get your eyes checked regularly.   Have a yearly physical exam. Have your blood pressure checked. Get your blood and urine tested.   Wear a pendant or bracelet saying that you have diabetes.   Check your feet every night for sores. Let your caregiver know if you have sores that are not healing.  SEEK MEDICAL CARE IF:  You are having problems keeping your blood glucose at target range.   You feel you might be having problems with your medicines.   You have symptoms of an illness that is not improving after 24  hours.   You have a sore or wound that is not healing.   You notice a change in vision or a new problem with your vision.   You develop a fever of more than 100.5.  Document Released: 11/11/2005 Document Re-Released: 12/03/2009 [employer] Patient Information 2011 [employer]. 

## 2011-04-15 NOTE — Assessment & Plan Note (Addendum)
Will try antivert, she had a very similar episode 2 years ago and she had a good response to antivert

## 2011-04-15 NOTE — Progress Notes (Signed)
Subjective:    Patient ID: [name], female    DOB: 07/22/1933, 77 y.o.   MRN: [id number]  Hypertension This is a chronic problem. The current episode started more than 1 year ago. The problem has been gradually improving since onset. The problem is controlled. Pertinent negatives include no anxiety, blurred vision, chest pain, headaches, malaise/fatigue, neck pain, orthopnea, palpitations, peripheral edema, PND, shortness of breath or sweats. There are no associated agents to hypertension. Past treatments include ACE inhibitors, diuretics and calcium channel blockers. The current treatment provides significant improvement. Compliance problems include diet and exercise.   Diabetes She presents for her follow-up diabetic visit. She has type 2 diabetes mellitus. No MedicAlert identification noted. Her disease course has been stable. Hypoglycemia symptoms include dizziness. Pertinent negatives for hypoglycemia include no confusion, headaches, hunger, nervousness/anxiousness, pallor, seizures, sleepiness, speech difficulty (insomnia, she wants to try trazodone), sweats or tremors. Pertinent negatives for diabetes include no blurred vision, no chest pain, no fatigue, no foot paresthesias, no foot ulcerations, no polydipsia, no polyphagia, no polyuria, no visual change, no weakness and no weight loss. There are no hypoglycemic complications. Symptoms are stable. There are no diabetic complications. Current diabetic treatment includes diet. She is compliant with treatment all of the time. Her weight is stable. She is following a generally healthy diet. Meal planning includes avoidance of concentrated sweets. She has not had a previous visit with a dietician. She participates in exercise intermittently. There is no change in her home blood glucose trend. Her breakfast blood glucose range is generally 90-110 mg/dl. Her lunch blood glucose range is generally 110-130 mg/dl. Her dinner blood glucose range is  generally 110-130 mg/dl. Her highest blood glucose is 90-110 mg/dl. Her overall blood glucose range is 90-110 mg/dl. An ACE inhibitor/angiotensin II receptor blocker is being taken. She does not see a podiatrist.Eye exam is current.      Review of Systems  Constitutional: Negative for fever, chills, weight loss, malaise/fatigue, diaphoresis, activity change, appetite change, fatigue and unexpected weight change.  HENT: Negative for sore throat, facial swelling, trouble swallowing, neck pain and voice change.   Eyes: Negative for blurred vision, photophobia, redness and visual disturbance.  Respiratory: Negative for apnea, cough, choking, chest tightness, shortness of breath, wheezing and stridor.   Cardiovascular: Negative for chest pain, palpitations, orthopnea, leg swelling and PND.  Gastrointestinal: Negative for nausea, vomiting, abdominal pain, diarrhea, constipation and blood in stool.  Genitourinary: Negative for dysuria, urgency, polyuria, frequency, hematuria, decreased urine volume, enuresis, difficulty urinating and dyspareunia.  Musculoskeletal: Negative for myalgias, back pain, joint swelling, arthralgias and gait problem.  Skin: Negative for color change, pallor and rash.  Neurological: Positive for dizziness. Negative for tremors, seizures, syncope, facial asymmetry, speech difficulty (insomnia, she wants to try trazodone), weakness, light-headedness, numbness and headaches.  Hematological: Negative for polydipsia, polyphagia and adenopathy. Does not bruise/bleed easily.  Psychiatric/Behavioral: Positive for sleep disturbance (insomnia, she wants to try trazodone). Negative for suicidal ideas, hallucinations, behavioral problems, confusion, self-injury, dysphoric mood, decreased concentration and agitation. The patient is not nervous/anxious and is not hyperactive.    She has had a recurrence of vertigo and dizziness, she had the same thing happen 2 years ago and she felt better  after taking anitvert.     Objective:   Physical Exam  [vitalsreviewed. Constitutional: She is oriented to person, place, and time. She appears well-developed and well-nourished. No distress.  HENT:  Head: Normocephalic and atraumatic.  Right Ear: External ear normal.  Left Ear:  External ear normal.  Nose: Nose normal.  Mouth/Throat: Oropharynx is clear and moist. No oropharyngeal exudate.  Eyes: Conjunctivae and EOM are normal. Pupils are equal, round, and reactive to light. Right eye exhibits no discharge. Left eye exhibits no discharge. No scleral icterus.  Neck: Normal range of motion. Neck supple. No JVD present. No tracheal deviation present. No thyromegaly present.  Cardiovascular: Normal rate, regular rhythm, normal heart sounds and intact distal pulses.  Exam reveals no gallop and no friction rub.   No murmur heard. Pulmonary/Chest: Effort normal and breath sounds normal. No stridor. No respiratory distress. She has no wheezes. She has no rales. She exhibits no tenderness.  Abdominal: Soft. Bowel sounds are normal. She exhibits no distension and no mass. There is no tenderness. There is no rebound and no guarding.  Musculoskeletal: Normal range of motion. She exhibits no edema and no tenderness.  Lymphadenopathy:    She has no cervical adenopathy.  Neurological: She is alert and oriented to person, place, and time. She has normal reflexes. She displays normal reflexes. No cranial nerve deficit. She exhibits normal muscle tone. Coordination normal.  Skin: Skin is warm and dry. No rash noted. She is not diaphoretic. No erythema. No pallor.  Psychiatric: She has a normal mood and affect. Her behavior is normal. Judgment and thought content normal.        Lab Results  Component Value Date   WBC 7.2 12/03/2010   HGB 12.8 12/03/2010   HCT 38.0 12/03/2010   PLT 142.0* 12/03/2010   CHOL 223* 12/03/2010   TRIG 226.0* 12/03/2010   HDL 76.00 12/03/2010   LDLDIRECT 122.9 12/03/2010   ALT 12  12/03/2010   AST 15 12/03/2010   NA 144 12/03/2010   K 4.1 12/03/2010   CL 108 12/03/2010   CREATININE 0.5 12/03/2010   BUN 12 12/03/2010   CO2 32 12/03/2010   TSH 3.22 12/03/2010   INR 2.29* 05/07/2010   HGBA1C 5.7 12/03/2010    Assessment & Plan:

## 2011-04-17 LAB — VITAMIN D 1,25 DIHYDROXY: Vitamin D 1, 25 (OH)2 Total: 37 pg/mL (ref 18–72)

## 2011-05-24 ENCOUNTER — Ambulatory Visit (INDEPENDENT_AMBULATORY_CARE_PROVIDER_SITE_OTHER): Payer: Medicare PPO | Admitting: Internal Medicine

## 2011-05-24 ENCOUNTER — Encounter: Payer: Self-pay | Admitting: Internal Medicine

## 2011-05-24 VITALS — BP 112/60 | HR 60 | Temp 98.5°F | Ht 62.0 in | Wt 211.1 lb

## 2011-05-24 DIAGNOSIS — I10 Essential (primary) hypertension: Secondary | ICD-10-CM

## 2011-05-24 DIAGNOSIS — Z0000 Encounter for general adult medical examination without abnormal findings: Secondary | ICD-10-CM | POA: Insufficient documentation

## 2011-05-24 DIAGNOSIS — R109 Unspecified abdominal pain: Secondary | ICD-10-CM

## 2011-05-24 DIAGNOSIS — N644 Mastodynia: Secondary | ICD-10-CM | POA: Insufficient documentation

## 2011-05-24 DIAGNOSIS — N63 Unspecified lump in unspecified breast: Secondary | ICD-10-CM

## 2011-05-24 DIAGNOSIS — F329 Major depressive disorder, single episode, unspecified: Secondary | ICD-10-CM

## 2011-05-24 DIAGNOSIS — F3289 Other specified depressive episodes: Secondary | ICD-10-CM

## 2011-05-24 NOTE — Progress Notes (Signed)
Subjective:    Patient ID: [name], female    DOB: 1933/11/01, 77 y.o.   MRN: [id number]  HPI  Here with 1 wk recurring discomfort below the umbilicus with a knot that comes and goes with changing position, assoc with some sense of abd discomfort ? Constipation;  Some tenderness there as well, no n/v, fever, and  Denies urinary symptoms such as dysuria, frequency, urgency,or hematuria.  Seems to occur at the incision area from prior TAH.  Also mentions 4 wks midl to mod breast tenderness without erythema, swellling, fever, driainage, or nipple d/c.  No prior hx and pt states has not herself felt any lump.  Denies worsening depressive symptoms, suicidal ideation, or panic, though has ongoing anxiety, not increased recently. Pt denies chest pain, increased sob or doe, wheezing, orthopnea, PND, increased LE swelling, palpitations, dizziness or syncope.  Pt denies new neurological symptoms such as new headache, or facial or extremity weakness or numbness  Pt denies polydipsia, polyuria. Past Medical History  Diagnosis Date  . Anemia     NOS  . Anxiety   . Depression   . Diabetes mellitus     type II  . Hyperlipidemia   . Hypertension   . Osteoarthritis    Past Surgical History  Procedure Date  . Appendectomy   . Cholecystectomy   . Abdominal hysterectomy   . Tonsillectomy     reports that she has never smoked. She does not have any smokeless tobacco history on file. She reports that she does not drink alcohol or use illicit drugs. family history includes Alcohol abuse in her other; Hypertension in her other; Kidney disease in her other; and Mental illness in her other. Allergies  Allergen Reactions  . Metformin     REACTION: diarrhea   Current Outpatient Prescriptions on File Prior to Visit  Medication Sig Dispense Refill  . ALPRAZolam (XANAX) 1 MG tablet 1 mg 3 (three) times daily as needed.        Marland Kitchen amLODipine (NORVASC) 10 MG tablet Take 10 mg by mouth daily.        .  Cholecalciferol (VITAMIN D3) 1000 UNITS CAPS Take 2 capsules by mouth daily.        . Enalapril-Hydrochlorothiazide 5-12.5 MG per tablet Take 2 tablets by mouth daily.        Marland Kitchen ketoconazole (NIZORAL) 2 % cream Apply topically. Apply under both breasts bid for 14 days       . meclizine (ANTIVERT) 12.5 MG tablet Take 1 tablet (12.5 mg total) by mouth 3 (three) times daily as needed for dizziness or nausea.  50 tablet  1  . pravastatin (PRAVACHOL) 80 MG tablet Take 80 mg by mouth daily.        . QUEtiapine (SEROQUEL) 200 MG tablet Take 200 mg by mouth at bedtime.         Review of Systems Review of Systems  Constitutional: Negative for diaphoresis and unexpected weight change.  HENT: Negative for drooling and tinnitus.   Eyes: Negative for photophobia and visual disturbance.  Respiratory: Negative for choking and stridor.   Gastrointestinal: Negative for vomiting and blood in stool.  Genitourinary: Negative for hematuria and decreased urine volume.       Objective:   Physical Exam BP 112/60  Pulse 60  Temp(Src) 98.5 F (36.9 C) (Oral)  Ht 5\' 2"  (1.575 m)  Wt 211 lb 2 oz (95.766 kg)  BMI 38.62 kg/m2  SpO2 95% Physical Exam  VS noted  Constitutional: Pt appears well-developed and well-nourished.  HENT: Head: Normocephalic.  Right Ear: External ear normal.  Left Ear: External ear normal.  Eyes: Conjunctivae and EOM are normal. Pupils are equal, round, and reactive to light.  Neck: Normal range of motion. Neck supple.  Right breast normal size, nontender, no mass or nipple d/c Left breast similar size, no nipple d/c or drainage or skin erythema, but has mild tenderness and subq mass approx 1 cm just above the areola in the midline Cardiovascular: Normal rate and regular rhythm.   Pulmonary/Chest: Effort normal and breath sounds normal.  Abd:  Soft, non-distended, + BS, tender prior incision but no mass felt with pt lying recumbant on exam table. Neurological: Pt is alert. No cranial  nerve deficit.  Skin: Skin is warm. No erythema.  Psychiatric: Pt behavior is normal. Thought content normal. 1+ nervous, not depressed appearing        Assessment & Plan:

## 2011-05-24 NOTE — Patient Instructions (Signed)
Please see P[facility]'s before leaving today for Gen surgury Appt for next wk, next available You will be contacted regarding the referral for: Diagnostic mammogram Continue all other medications as before

## 2011-05-24 NOTE — Assessment & Plan Note (Signed)
prob incisional hernia related, but reducible and not incarcerated; for gen surgury referral urgent

## 2011-05-26 ENCOUNTER — Encounter: Payer: Self-pay | Admitting: Internal Medicine

## 2011-05-26 NOTE — Assessment & Plan Note (Signed)
With mild tender - likely cyst, for diag mammogram adn tx and appropriate

## 2011-05-26 NOTE — Assessment & Plan Note (Signed)
See discussion breast lump today

## 2011-05-26 NOTE — Assessment & Plan Note (Signed)
stable overall by hx and exam, and pt to continue medical treatment as before 

## 2011-05-26 NOTE — Assessment & Plan Note (Signed)
stable overall by hx and exam, most recent data reviewed with pt, and pt to continue medical treatment as before  BP Readings from Last 3 Encounters:  05/24/11 112/60  04/15/11 110/64  02/06/11 162/64

## 2011-06-03 ENCOUNTER — Ambulatory Visit (INDEPENDENT_AMBULATORY_CARE_PROVIDER_SITE_OTHER): Payer: Medicare PPO | Admitting: Surgery

## 2011-06-03 ENCOUNTER — Encounter (INDEPENDENT_AMBULATORY_CARE_PROVIDER_SITE_OTHER): Payer: Self-pay | Admitting: Surgery

## 2011-06-03 VITALS — BP 124/99 | HR 66 | Ht 62.0 in | Wt 203.6 lb

## 2011-06-03 DIAGNOSIS — R1033 Periumbilical pain: Secondary | ICD-10-CM

## 2011-06-03 NOTE — Patient Instructions (Signed)
Return after CT is done.

## 2011-06-03 NOTE — Progress Notes (Signed)
Subjective:     Patient ID: [name], female   DOB: 01-25-1933, 77 y.o.   MRN: [id number]    BP 124/99  Pulse 66  Ht 5\' 2"  (1.575 m)  Wt 203 lb 9.6 oz (92.352 kg)  BMI 37.24 kg/m2    HPI The patient presents today for chief complaint of abdominal pain. It is located around her umbilicus. She has a lower abdominal scar from previous hysterectomy and does the pain is associated with it. It's been going on for about 2 months. It is made worse when she stands or increases her physical activity. She feels a bulge there. She is sent today at the request of Dr. Jonny Ruiz for this. She denies any nausea or vomiting. She has no changes in her bowel bladder function and the pain is 5-6/10. Pain medicine seems to help. Past Medical History  Diagnosis Date  . Anemia     NOS  . Anxiety   . Depression   . Diabetes mellitus     type II  . Hyperlipidemia   . Hypertension   . Osteoarthritis   . Wears dentures   . Bruises easily   . Diarrhea   . Abdominal pain   . Wears glasses    Past Surgical History  Procedure Date  . Appendectomy   . Cholecystectomy   . Abdominal hysterectomy   . Tonsillectomy   . Revision total knee arthroplasty 2011  . Rotator cuff repair 2010   Current Outpatient Prescriptions  Medication Sig Dispense Refill  . ALPRAZolam (XANAX) 1 MG tablet 1 mg 3 (three) times daily as needed.        Marland Kitchen amLODipine (NORVASC) 10 MG tablet Take 10 mg by mouth daily.        . Cholecalciferol (VITAMIN D3) 1000 UNITS CAPS Take 2 capsules by mouth daily.        . Enalapril-Hydrochlorothiazide 5-12.5 MG per tablet Take 2 tablets by mouth daily.        Marland Kitchen ketoconazole (NIZORAL) 2 % cream Apply topically. Apply under both breasts bid for 14 days       . meclizine (ANTIVERT) 12.5 MG tablet Take 1 tablet (12.5 mg total) by mouth 3 (three) times daily as needed for dizziness or nausea.  50 tablet  1  . pravastatin (PRAVACHOL) 80 MG tablet Take 80 mg by mouth daily.        . QUEtiapine (SEROQUEL)  200 MG tablet Take 200 mg by mouth at bedtime.        . traZODone (DESYREL) 100 MG tablet Take 100 mg by mouth at bedtime.         Family History  Problem Relation Age of Onset  . Alcohol abuse Other   . Hypertension Other   . Kidney disease Other   . Mental illness Other   . Cancer Father     prostate cancer  . Heart disease Sister   . Stroke Sister   . Heart disease Brother    History   Social History  . Marital Status: Widowed    Spouse Name: N/A    Number of Children: N/A  . Years of Education: N/A   Occupational History  . Not on file.   Social History Main Topics  . Smoking status: Never Smoker   . Smokeless tobacco: Not on file  . Alcohol Use: No  . Drug Use: No  . Sexually Active: Not Currently   Other Topics Concern  . Not on file  Social History Narrative  . No narrative on file    Review of Systems  Constitutional: Negative.   HENT: Negative.   Eyes: Positive for discharge.  Respiratory: Negative.   Cardiovascular: Negative.   Gastrointestinal: Positive for abdominal pain.  Genitourinary: Negative.   Musculoskeletal: Negative.   Skin: Negative.   Neurological: Negative.   Hematological: Negative.   Psychiatric/Behavioral: Negative.        Objective:   Physical Exam  Constitutional: She is oriented to person, place, and time. She appears well-developed and well-nourished.  HENT:  Head: Normocephalic and atraumatic.  Nose: Nose normal.  Eyes: Conjunctivae are normal. Pupils are equal, round, and reactive to light.  Neck: Normal range of motion. Neck supple.  Cardiovascular: Normal rate, regular rhythm, normal heart sounds and intact distal pulses.  Exam reveals no gallop and no friction rub.   No murmur heard. Pulmonary/Chest: Effort normal and breath sounds normal.  Abdominal: Soft. There is tenderness.    Musculoskeletal: Normal range of motion.  Neurological: She is alert and oriented to person, place, and time. She has normal  strength. No cranial nerve deficit or sensory deficit.  Skin: Skin is warm, dry and intact.  Psychiatric: She has a normal mood and affect. Her speech is normal and behavior is normal. Judgment and thought content normal. Cognition and memory are normal.       Assessment:  Abdominal pain previous hysterectomy incision Plan:   I will send her for a CT scan of the abdomen and pelvis to further evaluate her bowel pain. She is obese and her physical examination is difficult. She does have tenderness over the old incision. It is hard for me to feel a bulge. She will return after the above tests.

## 2011-06-04 ENCOUNTER — Encounter: Payer: Self-pay | Admitting: Internal Medicine

## 2011-06-05 ENCOUNTER — Encounter: Payer: Self-pay | Admitting: Internal Medicine

## 2011-06-05 ENCOUNTER — Ambulatory Visit
Admission: RE | Admit: 2011-06-05 | Discharge: 2011-06-05 | Disposition: A | Discharge status: Home / Self Care | Payer: Medicare PPO | Source: Ambulatory Visit | Attending: Surgery | Admitting: Surgery

## 2011-06-05 ENCOUNTER — Other Ambulatory Visit: Payer: Self-pay | Admitting: Internal Medicine

## 2011-06-05 DIAGNOSIS — K529 Noninfective gastroenteritis and colitis, unspecified: Secondary | ICD-10-CM | POA: Insufficient documentation

## 2011-06-05 DIAGNOSIS — R1033 Periumbilical pain: Secondary | ICD-10-CM

## 2011-06-05 MED ORDER — IOHEXOL 300 MG/ML  SOLN
125.0000 mL | Freq: Once | INTRAMUSCULAR | Status: AC | PRN
  Administered 2011-06-05: 125 mL via INTRAVENOUS

## 2011-06-06 ENCOUNTER — Telehealth (INDEPENDENT_AMBULATORY_CARE_PROVIDER_SITE_OTHER): Payer: Self-pay | Admitting: Surgery

## 2011-06-07 ENCOUNTER — Telehealth (INDEPENDENT_AMBULATORY_CARE_PROVIDER_SITE_OTHER): Payer: Self-pay | Admitting: General Surgery

## 2011-06-07 IMAGING — CR DG KNEE 1-2V*L*
2 series · 2 of 2 positions shown · non-contrast
Comparison: 08/21/2007

CLINICAL DATA: Painful left total knee arthroplasty.

LEFT KNEE - 1-2 VIEW

[view not recorded (1 of 2)]
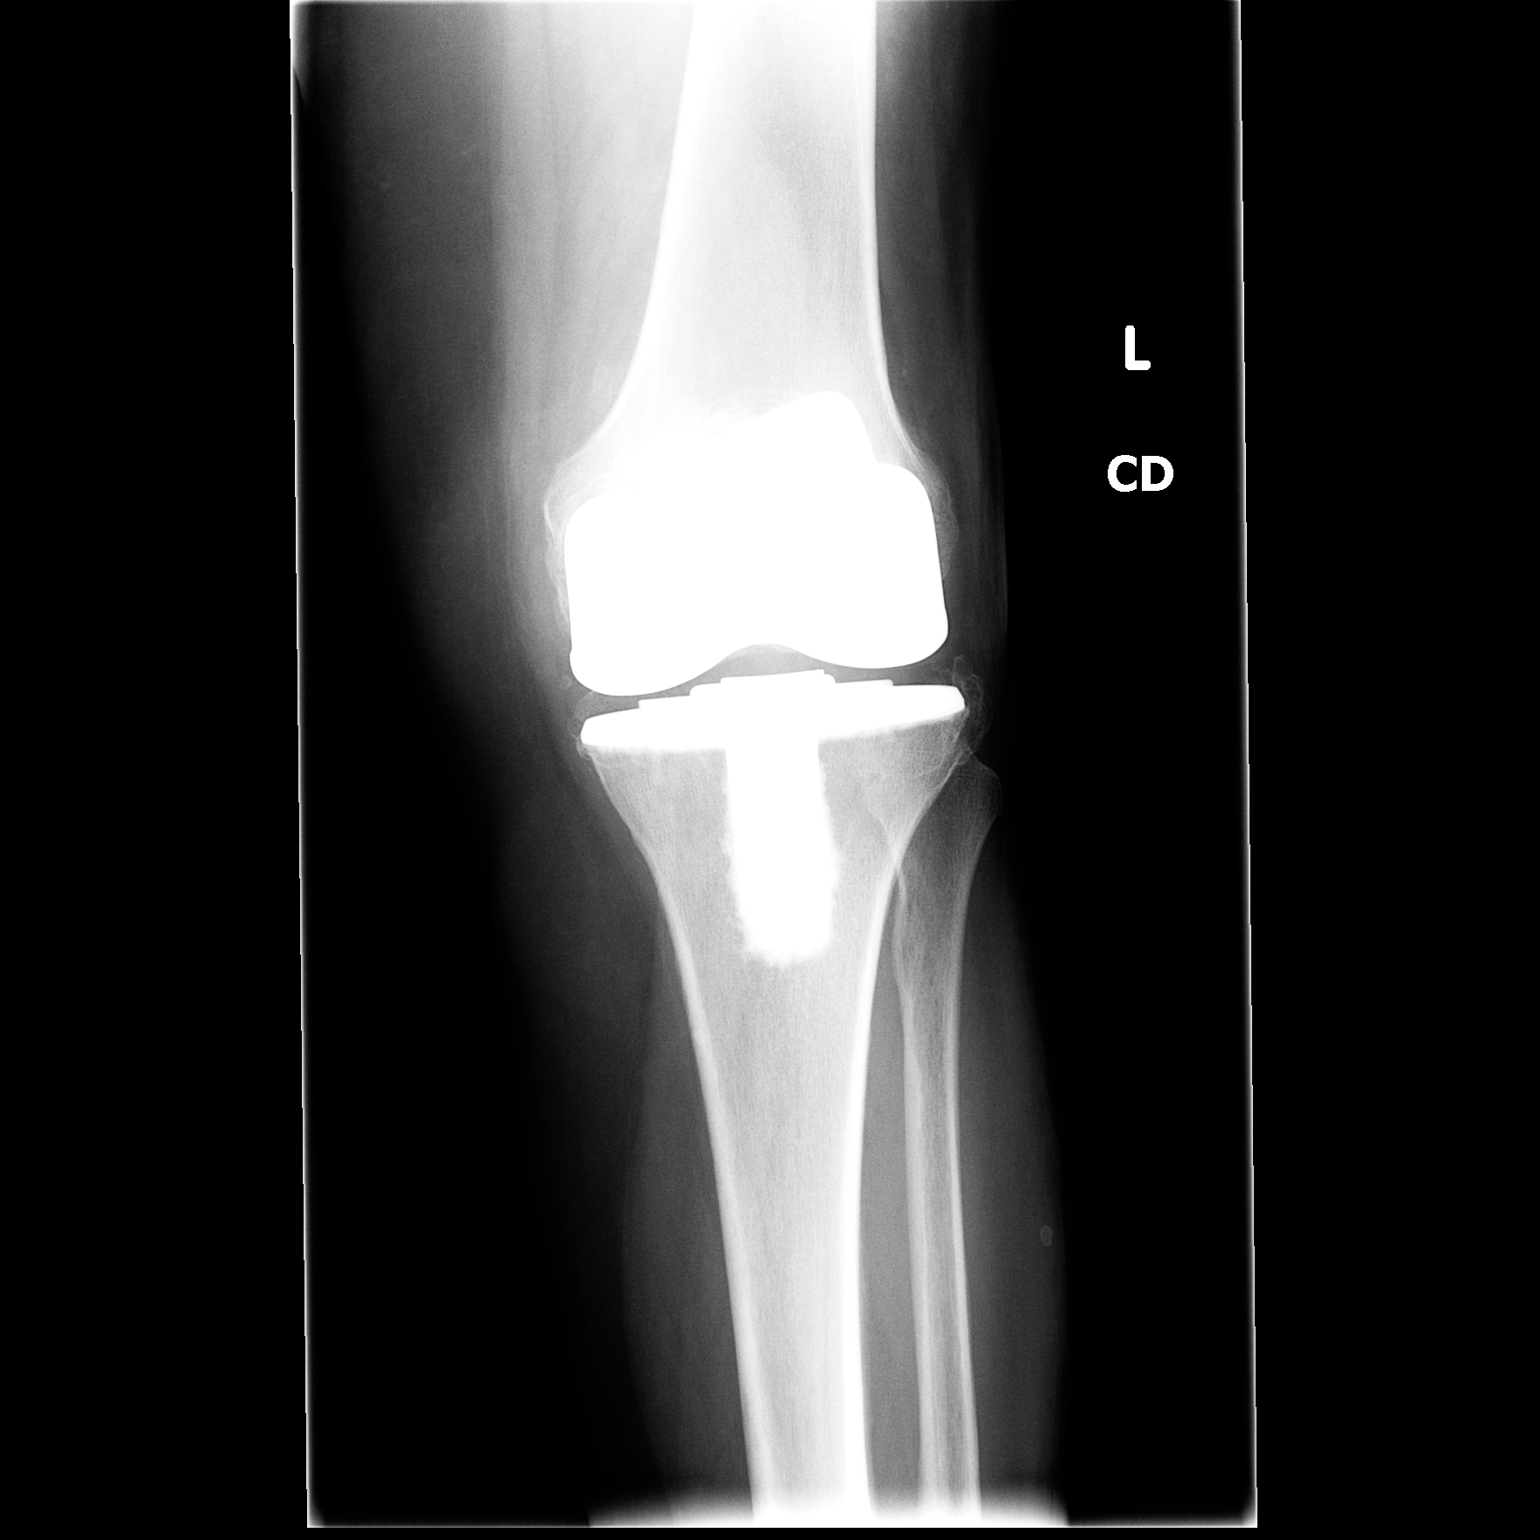

[view not recorded (2 of 2)]
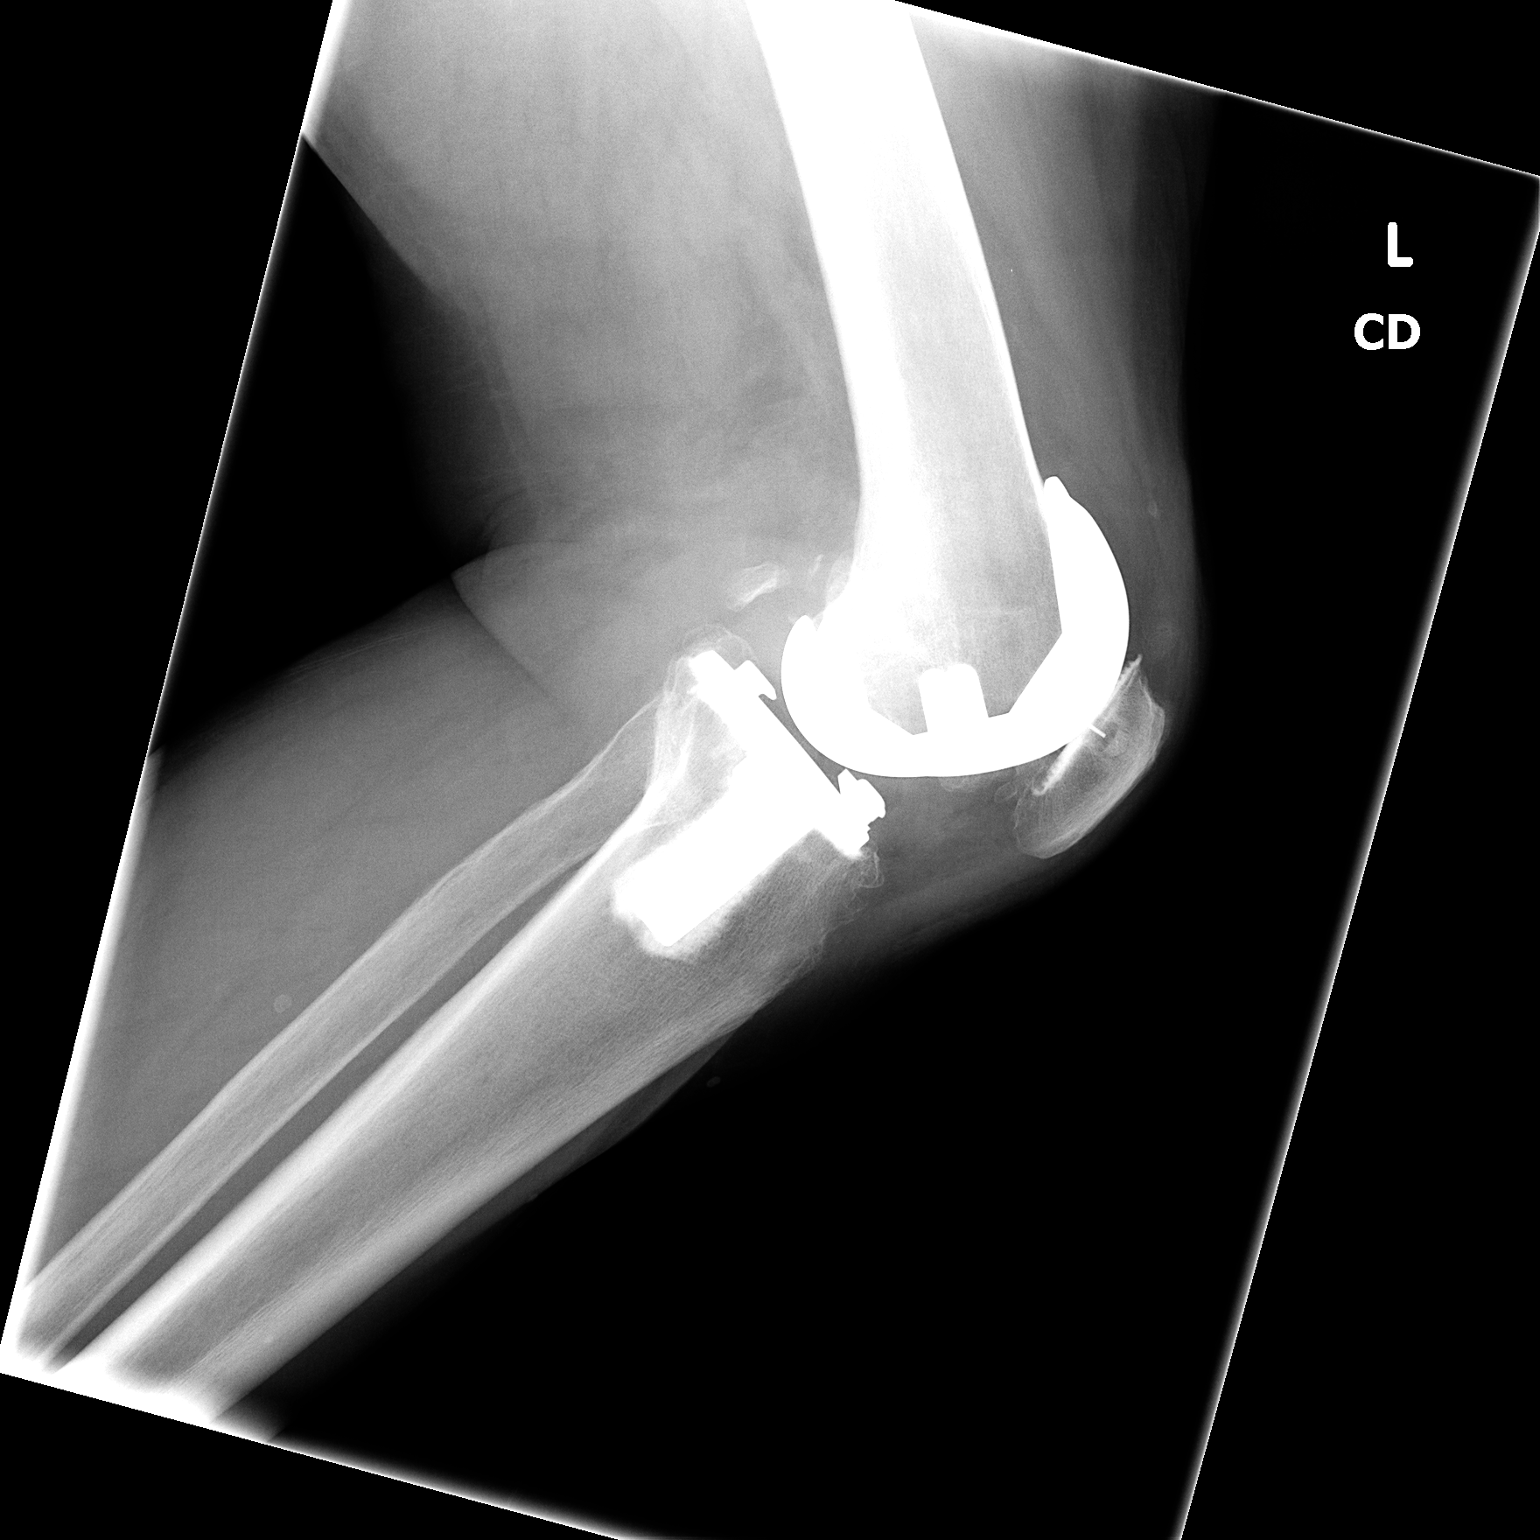

[2 of 2 positions shown; findings below may reference images not displayed]

FINDINGS: Three compartment left total knee arthroplasty is
present.  There is no lucency around the prosthesis or
periprosthetic fracture.  The patellar component appears unchanged
compared to the prior exam of 08/21/2007.  Small knee effusion is
present.  Prominent fabella noted. There are small calcific
densities consistent with loose bodies or residual osteophytes
located along the inferior aspect of the patellar component.
IMPRESSION: 1.  No evidence of loosening of the left total hip arthroplasty.
2.  Calcifications inferior to the patella likely represent
residual osteophytes rather than loose bodies.

## 2011-06-07 NOTE — Telephone Encounter (Signed)
[facility] FOR PT TO CALL RE CT SCAN AND APPT.

## 2011-06-07 NOTE — Telephone Encounter (Signed)
PT ADVISED OF CT RESULTS AND F/U WITH GI DR. MANN/ SHE WILL CONTACT HER MEDICAL DR. FOR REFERRAL.

## 2011-06-20 ENCOUNTER — Encounter: Payer: Self-pay | Admitting: Internal Medicine

## 2011-06-20 NOTE — Progress Notes (Signed)
Dr Juanda Chance has reviewed extensive gastroenterology records from Dr [facility] office and has declined to accept the patient into her practice.

## 2011-06-22 ENCOUNTER — Other Ambulatory Visit: Payer: Self-pay | Admitting: Internal Medicine

## 2011-06-24 NOTE — Telephone Encounter (Signed)
RX faxed to [facility]

## 2011-06-24 NOTE — Telephone Encounter (Signed)
Patient notified

## 2011-06-26 ENCOUNTER — Telehealth: Payer: Self-pay

## 2011-06-26 DIAGNOSIS — K529 Noninfective gastroenteritis and colitis, unspecified: Secondary | ICD-10-CM

## 2011-06-26 NOTE — Telephone Encounter (Signed)
Patient called [facility] stating that she had a CT scan with Dr Luisa Hart and advised to see GI. Patient seen Dr. Melina Fiddler in that past but received a message from their office advising that she will not accept pt back. Dr. Melina Fiddler office told her  To see Dr. Loreta Ave. Per Pt she previously left Dr Loreta Ave for [place] and prefer not to return. She would like to know if MD could recommend someone else or try again with Dr. Loreta Ave.

## 2011-06-27 NOTE — Telephone Encounter (Signed)
Notes Recorded by Clovis Pu. Cornett, MD on 06/05/2011 at 1:32 PM CT shows no hernia. Signs of mild colitis noted on CT. Would rec a GI consultation for colonoscopy.   Dr. Melina Fiddler decline to accept pt

## 2011-06-27 NOTE — Telephone Encounter (Signed)
Returned call to patient/[facility] advising referral placed in system and she will be contacted via referral process for appt date and time

## 2011-06-27 NOTE — Telephone Encounter (Signed)
What is the diagnosis for the referral?

## 2011-06-27 NOTE — Telephone Encounter (Signed)
done

## 2011-07-09 ENCOUNTER — Encounter (INDEPENDENT_AMBULATORY_CARE_PROVIDER_SITE_OTHER): Payer: Medicare PPO | Admitting: Surgery

## 2011-07-10 ENCOUNTER — Ambulatory Visit (INDEPENDENT_AMBULATORY_CARE_PROVIDER_SITE_OTHER): Payer: Medicare PPO | Admitting: Internal Medicine

## 2011-07-10 ENCOUNTER — Other Ambulatory Visit (INDEPENDENT_AMBULATORY_CARE_PROVIDER_SITE_OTHER): Payer: Medicare PPO

## 2011-07-10 ENCOUNTER — Encounter: Payer: Self-pay | Admitting: Internal Medicine

## 2011-07-10 DIAGNOSIS — Z79899 Other long term (current) drug therapy: Secondary | ICD-10-CM

## 2011-07-10 DIAGNOSIS — K5289 Other specified noninfective gastroenteritis and colitis: Secondary | ICD-10-CM

## 2011-07-10 DIAGNOSIS — R109 Unspecified abdominal pain: Secondary | ICD-10-CM

## 2011-07-10 DIAGNOSIS — D649 Anemia, unspecified: Secondary | ICD-10-CM

## 2011-07-10 DIAGNOSIS — K529 Noninfective gastroenteritis and colitis, unspecified: Secondary | ICD-10-CM

## 2011-07-10 DIAGNOSIS — E119 Type 2 diabetes mellitus without complications: Secondary | ICD-10-CM

## 2011-07-10 DIAGNOSIS — E785 Hyperlipidemia, unspecified: Secondary | ICD-10-CM

## 2011-07-10 DIAGNOSIS — I10 Essential (primary) hypertension: Secondary | ICD-10-CM

## 2011-07-10 LAB — URINALYSIS, ROUTINE W REFLEX MICROSCOPIC
Ketones, ur: NEGATIVE
Specific Gravity, Urine: 1.015 (ref 1.000–1.030)
Urine Glucose: NEGATIVE
Urobilinogen, UA: 0.2 (ref 0.0–1.0)

## 2011-07-10 LAB — IBC PANEL
Iron: 94 ug/dL (ref 42–145)
Saturation Ratios: 26.7 % (ref 20.0–50.0)
Transferrin: 251.5 mg/dL (ref 212.0–360.0)

## 2011-07-10 LAB — LIPID PANEL
HDL: 62 mg/dL (ref >39.00)
Total CHOL/HDL Ratio: 3
VLDL: 31.4 mg/dL (ref 0.0–40.0)

## 2011-07-10 LAB — AMYLASE: Amylase: 84 U/L (ref 27–131)

## 2011-07-10 LAB — CBC WITH DIFFERENTIAL/PLATELET
Basophils Absolute: 0 10*3/uL (ref 0.0–0.1)
Eosinophils Relative: 3.6 % (ref 0.0–5.0)
Lymphocytes Relative: 37.8 % (ref 12.0–46.0)
MCV: 92.9 fl (ref 78.0–100.0)
Monocytes Absolute: 0.3 10*3/uL (ref 0.1–1.0)
Neutrophils Relative %: 53.7 % (ref 43.0–77.0)
Platelets: 155 10*3/uL (ref 150.0–400.0)
WBC: 5.8 10*3/uL (ref 4.5–10.5)

## 2011-07-10 LAB — SEDIMENTATION RATE: Sed Rate: 10 mm/hr (ref 0–22)

## 2011-07-10 LAB — LIPASE: Lipase: 40 U/L (ref 11.0–59.0)

## 2011-07-10 NOTE — Progress Notes (Signed)
Subjective:    Patient ID: [name], female    DOB: 11-08-1933, 77 y.o.   MRN: [id number]  Abdominal Pain This is a recurrent problem. The current episode started 1 to 4 weeks ago. The onset quality is gradual. The problem occurs intermittently. The most recent episode lasted 1 month. The problem has been unchanged. The pain is located in the LLQ. The pain is at a severity of 3/10. The pain is moderate. The quality of the pain is aching. The abdominal pain does not radiate. Associated symptoms include diarrhea. Pertinent negatives include no anorexia, arthralgias, belching, constipation, dysuria, fever, flatus, frequency, headaches, hematochezia, hematuria, melena, myalgias, nausea, vomiting or weight loss. The pain is aggravated by nothing. The pain is relieved by nothing. She has tried nothing for the symptoms. Prior diagnostic workup includes CT scan. Her past medical history is significant for abdominal surgery.  She saw Dr. Jonny Ruiz and he told her that she had a hernia and she was referred to Dr. Luisa Hart who did a CT scan and found colitis. She has not been able to get an appt. With GI because Dr. Loreta Ave and Dr. Juanda Chance refuse to see her.    Review of Systems  Constitutional: Negative for fever, chills, weight loss, diaphoresis, activity change, appetite change, fatigue and unexpected weight change.  HENT: Negative for facial swelling, neck pain and neck stiffness.   Eyes: Negative for photophobia, redness and visual disturbance.  Respiratory: Negative for apnea, cough, choking, chest tightness, shortness of breath, wheezing and stridor.   Cardiovascular: Negative for chest pain, palpitations and leg swelling.  Gastrointestinal: Positive for abdominal pain and diarrhea. Negative for nausea, vomiting, constipation, blood in stool, melena, hematochezia, abdominal distention, anal bleeding, rectal pain, anorexia and flatus.  Genitourinary: Negative for dysuria, urgency, frequency, hematuria, flank  pain, decreased urine volume, vaginal bleeding, vaginal discharge, enuresis, difficulty urinating, vaginal pain, pelvic pain and dyspareunia.  Musculoskeletal: Negative for myalgias, back pain, joint swelling, arthralgias and gait problem.  Skin: Negative for color change, pallor, rash and wound.  Neurological: Negative for dizziness, tremors, seizures, syncope, facial asymmetry, speech difficulty, weakness, light-headedness, numbness and headaches.  Hematological: Negative for adenopathy. Does not bruise/bleed easily.  Psychiatric/Behavioral: Negative for suicidal ideas, hallucinations, behavioral problems, confusion, sleep disturbance, self-injury, dysphoric mood, decreased concentration and agitation. The patient is not nervous/anxious and is not hyperactive.        Objective:   Physical Exam  Vitals reviewed. Constitutional: She appears well-developed and well-nourished. No distress.  HENT:  Head: Normocephalic and atraumatic.  Right Ear: External ear normal.  Left Ear: External ear normal.  Nose: Nose normal.  Mouth/Throat: Oropharynx is clear and moist. No oropharyngeal exudate.  Eyes: Conjunctivae and EOM are normal. Pupils are equal, round, and reactive to light. Right eye exhibits no discharge. Left eye exhibits no discharge. No scleral icterus.  Neck: Normal range of motion. Neck supple. No JVD present. No tracheal deviation present. No thyromegaly present.  Cardiovascular: Normal rate, regular rhythm, normal heart sounds and intact distal pulses.  Exam reveals no gallop and no friction rub.   No murmur heard. Pulmonary/Chest: Effort normal and breath sounds normal. No stridor. No respiratory distress. She has no wheezes. She has no rales. She exhibits no tenderness.  Abdominal: Soft. Bowel sounds are normal. She exhibits no distension and no mass. There is no hepatosplenomegaly, splenomegaly or hepatomegaly. There is tenderness in the left lower quadrant. There is no rigidity, no  rebound, no guarding, no CVA tenderness, no tenderness at  McBurney's point and negative Murphy's sign. No hernia. Hernia confirmed negative in the ventral area, confirmed negative in the right inguinal area and confirmed negative in the left inguinal area.  Genitourinary: Rectal exam shows external hemorrhoid. Rectal exam shows no internal hemorrhoid, no fissure, no mass, no tenderness and anal tone normal. Guaiac negative stool. Right adnexum displays no mass, no tenderness and no fullness. Left adnexum displays no mass, no tenderness and no fullness.  Lymphadenopathy:    She has no cervical adenopathy.  Skin: She is not diaphoretic.     Lab Results  Component Value Date   WBC 9.0 04/15/2011   HGB 12.7 04/15/2011   HCT 37.8 04/15/2011   PLT 161.0 04/15/2011   CHOL 245* 04/15/2011   TRIG 297.0* 04/15/2011   HDL 78.30 04/15/2011   LDLDIRECT 150.1 04/15/2011   ALT 15 04/15/2011   AST 17 04/15/2011   NA 137 04/15/2011   K 4.0 04/15/2011   CL 98 04/15/2011   CREATININE 0.5 04/15/2011   BUN 13 04/15/2011   CO2 30 04/15/2011   TSH 4.07 04/15/2011   INR 2.29* 05/07/2010   HGBA1C 5.8 04/15/2011       Assessment & Plan:

## 2011-07-10 NOTE — Assessment & Plan Note (Addendum)
This is unchanged but I do not see anything acute today but will check labs to look for any organ pathology

## 2011-07-10 NOTE — Assessment & Plan Note (Signed)
Check her FLP today 

## 2011-07-10 NOTE — Assessment & Plan Note (Signed)
I will continue to try to get a GI doctor to see her, today I will check her CBC to look for an elevated WBC and will look at the ESR and CRP to look for IBD

## 2011-07-10 NOTE — Assessment & Plan Note (Signed)
I will recheck her A1C today 

## 2011-07-10 NOTE — Patient Instructions (Signed)
Abdominal Pain Abdominal pain can be caused by many things. Your caregiver decides the seriousness of your pain by an examination and possibly blood tests and X-rays. Many cases can be observed and treated at home. Most abdominal pain is not caused by a disease and will probably improve without treatment. However, in many cases, more time must pass before a clear cause of the pain can be found. Before that point, it may not be known if you need more testing, or if hospitalization or surgery is needed. HOME CARE INSTRUCTIONS  Do not take laxatives unless directed by your caregiver.   Take pain medicine only as directed by your caregiver.   Only take over-the-counter or prescription medicines for pain, discomfort, or fever as directed by your caregiver.   Try a clear liquid diet (broth, tea, or water) for 0 or as ordered by your caregiver. Slowly move to a bland diet as tolerated.  SEEK IMMEDIATE MEDICAL CARE IF:  The pain does not go away.   You or your child has an oral temperature above 100.5, not controlled by medicine.   You keep throwing up (vomiting).   The pain is felt only in portions of the abdomen. Pain in the right side could possibly be appendicitis. In an adult, pain in the left lower portion of the abdomen could be colitis or diverticulitis.   You pass bloody or black tarry stools.  MAKE SURE YOU:  Understand these instructions.   Will watch your condition.   Will get help right away if you are not doing well or get worse.  Document Released: 08/21/2005 Document Re-Released: 02/05/2010 [facility] Patient Information 2011 [place], [place].

## 2011-07-10 NOTE — Assessment & Plan Note (Signed)
Check CBC and iron/B12 levels today

## 2011-07-10 NOTE — Assessment & Plan Note (Signed)
Her BP is well controlled 

## 2011-07-11 LAB — C-REACTIVE PROTEIN: CRP: 0.2 mg/dL (ref <0.6)

## 2011-08-06 LAB — HM COLONOSCOPY

## 2011-08-19 ENCOUNTER — Ambulatory Visit: Payer: Medicare PPO | Admitting: Internal Medicine

## 2011-08-22 LAB — CBC
Hemoglobin: 13.2
RBC: 4.48
WBC: 9.4

## 2011-08-22 LAB — COMPREHENSIVE METABOLIC PANEL
ALT: 19
AST: 20
Alkaline Phosphatase: 55
CO2: 33 — ABNORMAL HIGH
Calcium: 10
Chloride: 103
GFR calc Af Amer: >60
GFR calc non Af Amer: >60
Glucose, Bld: 76
Potassium: 4.2
Sodium: 141
Total Bilirubin: 0.6

## 2011-08-22 LAB — URINALYSIS, ROUTINE W REFLEX MICROSCOPIC
Bilirubin Urine: NEGATIVE
Ketones, ur: NEGATIVE
Nitrite: NEGATIVE
Protein, ur: NEGATIVE
pH: 6

## 2011-08-22 LAB — URINE MICROSCOPIC-ADD ON

## 2011-09-06 LAB — CBC
HCT: 40.5
Hemoglobin: 13.5
RBC: 4.55
WBC: 8.9

## 2011-09-06 LAB — I-STAT 8, (EC8 V) (CONVERTED LAB)
Chloride: 104
Glucose, Bld: 116 — ABNORMAL HIGH
Potassium: 4.2
pH, Ven: 7.392 — ABNORMAL HIGH

## 2011-09-06 LAB — POCT CARDIAC MARKERS: Troponin i, poc: <0.05

## 2011-09-06 LAB — COMPREHENSIVE METABOLIC PANEL
Albumin: 4.1
Alkaline Phosphatase: 60
BUN: 22
CO2: 29
Chloride: 100
GFR calc non Af Amer: >60
Glucose, Bld: 113 — ABNORMAL HIGH
Potassium: 4
Total Bilirubin: 0.5

## 2011-09-06 LAB — DIFFERENTIAL
Basophils Absolute: 0
Basophils Relative: 0
Monocytes Absolute: 0.4
Neutro Abs: 6
Neutrophils Relative %: 68

## 2011-09-06 LAB — URINALYSIS, ROUTINE W REFLEX MICROSCOPIC
Hgb urine dipstick: NEGATIVE
Nitrite: NEGATIVE
Specific Gravity, Urine: 1.015
Urobilinogen, UA: 0.2
pH: 8

## 2011-09-06 LAB — URINE MICROSCOPIC-ADD ON

## 2011-10-21 ENCOUNTER — Other Ambulatory Visit: Payer: Self-pay | Admitting: Internal Medicine

## 2011-11-08 ENCOUNTER — Encounter (HOSPITAL_COMMUNITY): Payer: Self-pay | Admitting: Emergency Medicine

## 2011-11-08 ENCOUNTER — Emergency Department (HOSPITAL_COMMUNITY): Payer: No Typology Code available for payment source

## 2011-11-08 ENCOUNTER — Emergency Department (HOSPITAL_COMMUNITY)
Admission: EM | Admit: 2011-11-08 | Discharge: 2011-11-08 | Disposition: A | Discharge status: Home / Self Care | Payer: No Typology Code available for payment source | Attending: Emergency Medicine | Admitting: Emergency Medicine

## 2011-11-08 DIAGNOSIS — M545 Low back pain, unspecified: Secondary | ICD-10-CM | POA: Insufficient documentation

## 2011-11-08 DIAGNOSIS — M62838 Other muscle spasm: Secondary | ICD-10-CM | POA: Insufficient documentation

## 2011-11-08 DIAGNOSIS — I10 Essential (primary) hypertension: Secondary | ICD-10-CM | POA: Insufficient documentation

## 2011-11-08 DIAGNOSIS — Y92410 Unspecified street and highway as the place of occurrence of the external cause: Secondary | ICD-10-CM | POA: Insufficient documentation

## 2011-11-08 DIAGNOSIS — T1490XA Injury, unspecified, initial encounter: Secondary | ICD-10-CM | POA: Insufficient documentation

## 2011-11-08 DIAGNOSIS — E119 Type 2 diabetes mellitus without complications: Secondary | ICD-10-CM | POA: Insufficient documentation

## 2011-11-08 LAB — GLUCOSE, CAPILLARY: Glucose-Capillary: 120 mg/dL — ABNORMAL HIGH (ref 70–99)

## 2011-11-08 MED ORDER — HYDROCODONE-ACETAMINOPHEN 5-500 MG PO TABS: 1.0000 | ORAL_TABLET | Freq: Four times a day (QID) | ORAL | Status: AC | PRN

## 2011-11-08 MED ORDER — HYDROCODONE-ACETAMINOPHEN 5-325 MG PO TABS
1.0000 | ORAL_TABLET | Freq: Once | ORAL | Status: AC
  Administered 2011-11-08: 1 via ORAL
  Filled 2011-11-08: qty 1
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
## 2011-11-10 NOTE — ED Provider Notes (Signed)
Medical screening examination/treatment/procedure(s) were performed by non-physician practitioner and as supervising physician I was immediately available for consultation/collaboration.  Cade Olberding M Teriyah Purington, MD 11/10/11 0728 

## 2011-11-15 ENCOUNTER — Ambulatory Visit
Admission: RE | Admit: 2011-11-15 | Discharge: 2011-11-15 | Disposition: A | Discharge status: Home / Self Care | Payer: Self-pay | Source: Ambulatory Visit | Attending: Orthopedic Surgery | Admitting: Orthopedic Surgery

## 2011-11-15 ENCOUNTER — Other Ambulatory Visit: Payer: Self-pay | Admitting: Orthopedic Surgery

## 2011-11-15 DIAGNOSIS — S161XXA Strain of muscle, fascia and tendon at neck level, initial encounter: Secondary | ICD-10-CM

## 2011-11-28 ENCOUNTER — Ambulatory Visit: Payer: Medicare HMO | Attending: Orthopedic Surgery | Admitting: Rehabilitative and Restorative Service Providers"

## 2011-11-28 DIAGNOSIS — M545 Low back pain, unspecified: Secondary | ICD-10-CM | POA: Insufficient documentation

## 2011-11-28 DIAGNOSIS — IMO0001 Reserved for inherently not codable concepts without codable children: Secondary | ICD-10-CM | POA: Insufficient documentation

## 2011-12-02 ENCOUNTER — Ambulatory Visit: Payer: Medicare HMO | Admitting: Physical Therapy

## 2011-12-05 ENCOUNTER — Ambulatory Visit: Payer: Medicare HMO | Admitting: Physical Therapy

## 2011-12-11 ENCOUNTER — Ambulatory Visit: Payer: Medicare HMO | Admitting: Rehabilitation

## 2011-12-12 ENCOUNTER — Other Ambulatory Visit: Payer: Self-pay | Admitting: Internal Medicine

## 2011-12-18 ENCOUNTER — Ambulatory Visit: Payer: Medicare HMO | Admitting: Rehabilitation

## 2011-12-19 ENCOUNTER — Ambulatory Visit (INDEPENDENT_AMBULATORY_CARE_PROVIDER_SITE_OTHER): Payer: Medicare HMO | Admitting: Internal Medicine

## 2011-12-19 ENCOUNTER — Encounter: Payer: Self-pay | Admitting: Internal Medicine

## 2011-12-19 ENCOUNTER — Other Ambulatory Visit (INDEPENDENT_AMBULATORY_CARE_PROVIDER_SITE_OTHER): Payer: Medicare HMO

## 2011-12-19 DIAGNOSIS — E785 Hyperlipidemia, unspecified: Secondary | ICD-10-CM

## 2011-12-19 DIAGNOSIS — K5289 Other specified noninfective gastroenteritis and colitis: Secondary | ICD-10-CM

## 2011-12-19 DIAGNOSIS — E119 Type 2 diabetes mellitus without complications: Secondary | ICD-10-CM

## 2011-12-19 DIAGNOSIS — K529 Noninfective gastroenteritis and colitis, unspecified: Secondary | ICD-10-CM

## 2011-12-19 DIAGNOSIS — D649 Anemia, unspecified: Secondary | ICD-10-CM

## 2011-12-19 DIAGNOSIS — I10 Essential (primary) hypertension: Secondary | ICD-10-CM

## 2011-12-19 DIAGNOSIS — E559 Vitamin D deficiency, unspecified: Secondary | ICD-10-CM

## 2011-12-19 DIAGNOSIS — F411 Generalized anxiety disorder: Secondary | ICD-10-CM

## 2011-12-19 DIAGNOSIS — Z79899 Other long term (current) drug therapy: Secondary | ICD-10-CM

## 2011-12-19 LAB — URINALYSIS, ROUTINE W REFLEX MICROSCOPIC
Hgb urine dipstick: NEGATIVE
Total Protein, Urine: NEGATIVE
Urine Glucose: NEGATIVE

## 2011-12-19 LAB — CBC WITH DIFFERENTIAL/PLATELET
Basophils Absolute: 0 10*3/uL (ref 0.0–0.1)
Eosinophils Absolute: 0.1 10*3/uL (ref 0.0–0.7)
HCT: 37.9 % (ref 36.0–46.0)
Hemoglobin: 12.6 g/dL (ref 12.0–15.0)
Lymphs Abs: 1.9 10*3/uL (ref 0.7–4.0)
MCHC: 33.1 g/dL (ref 30.0–36.0)
MCV: 94 fl (ref 78.0–100.0)
Monocytes Absolute: 0.4 10*3/uL (ref 0.1–1.0)
Monocytes Relative: 5.8 % (ref 3.0–12.0)
Neutro Abs: 4 10*3/uL (ref 1.4–7.7)
RDW: 12.9 % (ref 11.5–14.6)

## 2011-12-19 LAB — HEMOGLOBIN A1C: Hgb A1c MFr Bld: 5.3 % (ref 4.6–6.5)

## 2011-12-19 LAB — COMPREHENSIVE METABOLIC PANEL
ALT: 11 U/L (ref 0–35)
CO2: 33 mEq/L — ABNORMAL HIGH (ref 19–32)
Calcium: 9.4 mg/dL (ref 8.4–10.5)
Chloride: 102 mEq/L (ref 96–112)
Creatinine, Ser: 0.6 mg/dL (ref 0.4–1.2)
GFR: 115.77 mL/min (ref >60.00)
Glucose, Bld: 94 mg/dL (ref 70–99)

## 2011-12-19 LAB — IBC PANEL
Iron: 87 ug/dL (ref 42–145)
Saturation Ratios: 24.9 % (ref 20.0–50.0)

## 2011-12-19 LAB — LIPID PANEL
Cholesterol: 197 mg/dL (ref 0–200)
HDL: 69 mg/dL (ref >39.00)
Triglycerides: 117 mg/dL (ref 0.0–149.0)

## 2011-12-19 LAB — VITAMIN B12: Vitamin B-12: 268 pg/mL (ref 211–911)

## 2011-12-19 MED ORDER — ALPRAZOLAM 1 MG PO TABS: 1.0000 mg | ORAL_TABLET | Freq: Three times a day (TID) | ORAL | Status: DC | PRN

## 2011-12-19 NOTE — Patient Instructions (Signed)

## 2011-12-19 NOTE — Progress Notes (Signed)
Subjective:    Patient ID: [name], female    DOB: December 19, 1932, 78 y.o.   MRN: [id number]  Diabetes She presents for her follow-up diabetic visit. She has type 2 diabetes mellitus. Her disease course has been stable. There are no hypoglycemic associated symptoms. Pertinent negatives for hypoglycemia include no dizziness, headaches, pallor, seizures, speech difficulty, sweats or tremors. Pertinent negatives for diabetes include no blurred vision, no chest pain, no fatigue, no foot paresthesias, no foot ulcerations, no polydipsia, no polyphagia, no polyuria, no visual change, no weakness and no weight loss. There are no hypoglycemic complications. Symptoms are stable. There are no diabetic complications. Current diabetic treatment includes diet. She is compliant with treatment all of the time. Her weight is stable. She is following a generally healthy diet. Meal planning includes avoidance of concentrated sweets. She never participates in exercise. There is no change in her home blood glucose trend. An ACE inhibitor/angiotensin II receptor blocker is being taken. She does not see a podiatrist.Eye exam is current.  Hypertension This is a chronic problem. The current episode started more than 1 year ago. The problem is unchanged. The problem is controlled. Pertinent negatives include no anxiety, blurred vision, chest pain, headaches, malaise/fatigue, neck pain, orthopnea, palpitations, peripheral edema, PND, shortness of breath or sweats. There are no associated agents to hypertension. Past treatments include ACE inhibitors, calcium channel blockers and diuretics. The current treatment provides moderate improvement. Compliance problems include exercise and diet.       Review of Systems  Constitutional: Negative for fever, chills, weight loss, malaise/fatigue, diaphoresis, activity change, appetite change, fatigue and unexpected weight change.  HENT: Negative.  Negative for neck pain.   Eyes: Negative.   Negative for blurred vision.  Respiratory: Negative for cough, choking, chest tightness, shortness of breath, wheezing and stridor.   Cardiovascular: Negative for chest pain, palpitations, orthopnea, leg swelling and PND.  Gastrointestinal: Negative for nausea, vomiting, abdominal pain, diarrhea, constipation, blood in stool and abdominal distention.  Genitourinary: Negative.  Negative for polyuria.  Musculoskeletal: Negative for myalgias, back pain, joint swelling, arthralgias and gait problem.  Skin: Negative for color change, pallor, rash and wound.  Neurological: Negative for dizziness, tremors, seizures, syncope, facial asymmetry, speech difficulty, weakness, light-headedness, numbness and headaches.  Hematological: Negative for polydipsia, polyphagia and adenopathy. Does not bruise/bleed easily.  Psychiatric/Behavioral: Negative.        Objective:   Physical Exam  Vitals reviewed. Constitutional: She is oriented to person, place, and time. She appears well-developed and well-nourished. No distress.  HENT:  Head: Normocephalic and atraumatic.  Mouth/Throat: Oropharynx is clear and moist. No oropharyngeal exudate.  Eyes: Conjunctivae are normal. Right eye exhibits no discharge. Left eye exhibits no discharge. No scleral icterus.  Neck: Normal range of motion. Neck supple. No JVD present. No tracheal deviation present. No thyromegaly present.  Cardiovascular: Normal rate, regular rhythm, normal heart sounds and intact distal pulses.  Exam reveals no gallop and no friction rub.   No murmur heard. Pulmonary/Chest: Effort normal and breath sounds normal. No stridor. No respiratory distress. She has no wheezes. She has no rales. She exhibits no tenderness.  Abdominal: Soft. Bowel sounds are normal. She exhibits no distension and no mass. There is no tenderness. There is no rebound and no guarding.  Musculoskeletal: Normal range of motion. She exhibits no edema and no tenderness.    Lymphadenopathy:    She has no cervical adenopathy.  Neurological: She is oriented to person, place, and time.  Skin: Skin  is warm and dry. No rash noted. She is not diaphoretic. No erythema. No pallor.  Psychiatric: She has a normal mood and affect. Her behavior is normal. Judgment and thought content normal.      Lab Results  Component Value Date   WBC 5.8 07/10/2011   HGB 13.1 07/10/2011   HCT 39.1 07/10/2011   PLT 155.0 07/10/2011   GLUCOSE 84 04/15/2011   CHOL 186 07/10/2011   TRIG 157.0* 07/10/2011   HDL 62.00 07/10/2011   LDLDIRECT 150.1 04/15/2011   LDLCALC 93 07/10/2011   ALT 15 04/15/2011   AST 17 04/15/2011   NA 137 04/15/2011   K 4.0 04/15/2011   CL 98 04/15/2011   CREATININE 0.5 04/15/2011   BUN 13 04/15/2011   CO2 30 04/15/2011   TSH 4.07 04/15/2011   INR 2.29* 05/07/2010   HGBA1C 5.5 07/10/2011      Assessment & Plan:

## 2011-12-19 NOTE — Assessment & Plan Note (Signed)
She is doing well on pravastatin, I will check her FLP today 

## 2011-12-19 NOTE — Assessment & Plan Note (Signed)
BP is well controlled, I will check her lytes today

## 2011-12-19 NOTE — Assessment & Plan Note (Signed)
She is due for a vitamin d recheck today

## 2011-12-19 NOTE — Assessment & Plan Note (Signed)
She is due for an a1c check today 

## 2011-12-19 NOTE — Assessment & Plan Note (Signed)
i will recheck her CBC and vitamin levels today

## 2011-12-19 NOTE — Assessment & Plan Note (Signed)
She is seeing a GI doctor in [facility]

## 2011-12-23 ENCOUNTER — Ambulatory Visit: Payer: Medicare HMO | Admitting: Rehabilitation

## 2011-12-26 ENCOUNTER — Ambulatory Visit: Payer: Medicare HMO | Admitting: Rehabilitative and Restorative Service Providers"

## 2011-12-30 ENCOUNTER — Other Ambulatory Visit: Payer: Self-pay | Admitting: Internal Medicine

## 2011-12-30 ENCOUNTER — Ambulatory Visit: Payer: Medicare HMO | Attending: Orthopedic Surgery | Admitting: Rehabilitation

## 2011-12-30 DIAGNOSIS — M545 Low back pain, unspecified: Secondary | ICD-10-CM | POA: Insufficient documentation

## 2011-12-30 DIAGNOSIS — IMO0001 Reserved for inherently not codable concepts without codable children: Secondary | ICD-10-CM | POA: Insufficient documentation

## 2012-01-01 ENCOUNTER — Ambulatory Visit: Payer: Medicare HMO | Admitting: Rehabilitative and Restorative Service Providers"

## 2012-01-07 ENCOUNTER — Ambulatory Visit: Payer: Medicare HMO | Admitting: Rehabilitation

## 2012-01-09 ENCOUNTER — Ambulatory Visit: Payer: Medicare HMO | Admitting: Rehabilitative and Restorative Service Providers"

## 2012-01-14 ENCOUNTER — Encounter: Payer: Medicare HMO | Admitting: Rehabilitative and Restorative Service Providers"

## 2012-01-16 ENCOUNTER — Encounter: Payer: Medicare HMO | Admitting: Rehabilitative and Restorative Service Providers"

## 2012-01-21 ENCOUNTER — Encounter: Payer: Medicare HMO | Admitting: Rehabilitative and Restorative Service Providers"

## 2012-01-22 ENCOUNTER — Encounter: Payer: Medicare HMO | Admitting: Rehabilitative and Restorative Service Providers"

## 2012-03-24 ENCOUNTER — Other Ambulatory Visit: Payer: Self-pay | Admitting: Internal Medicine

## 2012-04-06 ENCOUNTER — Ambulatory Visit
Admission: RE | Admit: 2012-04-06 | Discharge: 2012-04-06 | Disposition: A | Discharge status: Home / Self Care | Payer: Self-pay | Source: Ambulatory Visit | Attending: Orthopedic Surgery | Admitting: Orthopedic Surgery

## 2012-04-06 ENCOUNTER — Other Ambulatory Visit: Payer: Self-pay | Admitting: Orthopedic Surgery

## 2012-04-06 DIAGNOSIS — M545 Low back pain: Secondary | ICD-10-CM

## 2012-04-16 ENCOUNTER — Encounter: Payer: Self-pay | Admitting: Internal Medicine

## 2012-04-16 ENCOUNTER — Ambulatory Visit (INDEPENDENT_AMBULATORY_CARE_PROVIDER_SITE_OTHER): Payer: Medicare HMO | Admitting: Internal Medicine

## 2012-04-16 ENCOUNTER — Other Ambulatory Visit (INDEPENDENT_AMBULATORY_CARE_PROVIDER_SITE_OTHER): Payer: Medicare HMO

## 2012-04-16 VITALS — BP 130/64 | HR 66 | Temp 97.3°F | Wt 204.5 lb

## 2012-04-16 DIAGNOSIS — G4730 Sleep apnea, unspecified: Secondary | ICD-10-CM

## 2012-04-16 DIAGNOSIS — Z23 Encounter for immunization: Secondary | ICD-10-CM

## 2012-04-16 DIAGNOSIS — D649 Anemia, unspecified: Secondary | ICD-10-CM

## 2012-04-16 DIAGNOSIS — I10 Essential (primary) hypertension: Secondary | ICD-10-CM

## 2012-04-16 DIAGNOSIS — E785 Hyperlipidemia, unspecified: Secondary | ICD-10-CM

## 2012-04-16 DIAGNOSIS — G4733 Obstructive sleep apnea (adult) (pediatric): Secondary | ICD-10-CM | POA: Insufficient documentation

## 2012-04-16 DIAGNOSIS — F411 Generalized anxiety disorder: Secondary | ICD-10-CM

## 2012-04-16 DIAGNOSIS — H9192 Unspecified hearing loss, left ear: Secondary | ICD-10-CM | POA: Insufficient documentation

## 2012-04-16 DIAGNOSIS — H9190 Unspecified hearing loss, unspecified ear: Secondary | ICD-10-CM

## 2012-04-16 DIAGNOSIS — E119 Type 2 diabetes mellitus without complications: Secondary | ICD-10-CM

## 2012-04-16 DIAGNOSIS — M1990 Unspecified osteoarthritis, unspecified site: Secondary | ICD-10-CM

## 2012-04-16 DIAGNOSIS — M949 Disorder of cartilage, unspecified: Secondary | ICD-10-CM

## 2012-04-16 DIAGNOSIS — M8580 Other specified disorders of bone density and structure, unspecified site: Secondary | ICD-10-CM | POA: Insufficient documentation

## 2012-04-16 LAB — URINALYSIS, ROUTINE W REFLEX MICROSCOPIC
Bilirubin Urine: NEGATIVE
Nitrite: NEGATIVE
Total Protein, Urine: NEGATIVE
Urobilinogen, UA: 0.2 (ref 0.0–1.0)

## 2012-04-16 LAB — COMPREHENSIVE METABOLIC PANEL
ALT: 11 U/L (ref 0–35)
BUN: 12 mg/dL (ref 6–23)
CO2: 28 mEq/L (ref 19–32)
Calcium: 9.7 mg/dL (ref 8.4–10.5)
Creatinine, Ser: 0.6 mg/dL (ref 0.4–1.2)
GFR: 122.26 mL/min (ref >60.00)
Glucose, Bld: 120 mg/dL — ABNORMAL HIGH (ref 70–99)
Potassium: 3.5 mEq/L (ref 3.5–5.1)
Sodium: 141 mEq/L (ref 135–145)
Total Bilirubin: 0.6 mg/dL (ref 0.3–1.2)

## 2012-04-16 LAB — CBC WITH DIFFERENTIAL/PLATELET
Eosinophils Relative: 4.1 % (ref 0.0–5.0)
Lymphocytes Relative: 34.9 % (ref 12.0–46.0)
Monocytes Relative: 5.7 % (ref 3.0–12.0)
Neutrophils Relative %: 54.9 % (ref 43.0–77.0)
Platelets: 151 10*3/uL (ref 150.0–400.0)
RBC: 4.13 Mil/uL (ref 3.87–5.11)
WBC: 5.6 10*3/uL (ref 4.5–10.5)

## 2012-04-16 LAB — HEMOGLOBIN A1C: Hgb A1c MFr Bld: 5.3 % (ref 4.6–6.5)

## 2012-04-16 LAB — HM DIABETES FOOT EXAM: HM Diabetic Foot Exam: NORMAL

## 2012-04-16 MED ORDER — ALPRAZOLAM 1 MG PO TABS: 1.0000 mg | ORAL_TABLET | Freq: Three times a day (TID) | ORAL | Status: DC | PRN

## 2012-04-16 MED ORDER — HYDROCODONE-ACETAMINOPHEN 5-500 MG PO TABS: 1.0000 | ORAL_TABLET | Freq: Four times a day (QID) | ORAL | Status: DC | PRN

## 2012-04-16 MED ORDER — TRAZODONE HCL 100 MG PO TABS: 50.0000 mg | ORAL_TABLET | Freq: Every day | ORAL | Status: DC

## 2012-04-16 NOTE — Assessment & Plan Note (Signed)
Continue benzo as needed 

## 2012-04-16 NOTE — Assessment & Plan Note (Signed)
Continue current meds for symptom relief

## 2012-04-16 NOTE — Assessment & Plan Note (Signed)
She is due for a BMD

## 2012-04-16 NOTE — Patient Instructions (Signed)
Sleep Apnea Sleep apnea is a common disorder. The main problem of this disorder is excessive daytime sleepiness and compromised quality of life. This may include social and emotional problems. There are two types of sleep apnea.  Obstructive sleep apnea is when breathing stops due to a blocked airway.   Central sleep apnea is a malfunction of the brain's normal signal to breathe.  SYMPTOMS  Restless sleep.   Falling asleep while driving and/or during the day.   Loss of energy.   Irritability.   Mood or behavior changes.   Loud, heavy snoring.   Morning headaches.   Trouble concentrating.   Forgetfulness.   Anxiety or depression.   Decreased interest in sex.  Not all people with sleep apnea have all of these symptoms. However, people who have a few of these symptoms should visit their caregiver for an evaluation. Problems related to untreated sleep apnea include:  High blood pressure (hypertension).   Coronary artery disease.   Impotence.   Cognitive dysfunction.   Memory loss.  TREATMENT  For mild cases, treatment may include avoiding sleeping on one's back.   For people with nasal congestion, a decongestant may be prescribed.   Patients with obstructive and central apnea should avoid depressants. This includes alcohol, sedatives and narcotics. Weight loss and diet control are encouraged for overweight patients.   Many serious cases of obstructive sleep apnea can be relieved by a treatment called nasal continuous positive airway pressure (nasal CPAP). Nasal CPAP uses a mask-like device and pump that work together to keep the airway open. The pump delivers air pressure during each breath.   Surgery may help some patients by stopping or reducing the narrowing of the airway due to anatomical defects.  PROGNOSIS  Removing the obstruction usually reverses hypertension and cardiac problems. Untreated, sleep apnea sufferers have a tendency to fall asleep during the day.  This is can result in serious accident or loss of ones job. RESEARCH Sleep apnea is currently one of the most active areas of sleep research.  Document Released: 11/01/2002 Document Revised: 10/31/2011 Document Reviewed: 02/27/2006 [employer] Patient Information 2012 [employer]. 

## 2012-04-16 NOTE — Assessment & Plan Note (Signed)
She is doing well on pravavchol

## 2012-04-16 NOTE — Assessment & Plan Note (Signed)
This has improved, I will recheck her CBC today

## 2012-04-16 NOTE — Assessment & Plan Note (Signed)
Her BP is well controlled, I will check her lytes and renal function today 

## 2012-04-16 NOTE — Progress Notes (Signed)
Subjective:    Patient ID: [name], female    DOB: 06/14/1933, 78 y.o.   MRN: [id number]  Hypertension This is a chronic problem. The current episode started more than 1 year ago. The problem has been gradually improving since onset. The problem is controlled. Associated symptoms include anxiety. Pertinent negatives include no blurred vision, chest pain, headaches, malaise/fatigue, neck pain, orthopnea, palpitations, peripheral edema, PND, shortness of breath or sweats. There are no associated agents to hypertension. Past treatments include ACE inhibitors, diuretics and calcium channel blockers. The current treatment provides moderate improvement. Compliance problems include exercise and diet.  Identifiable causes of hypertension include sleep apnea.      Review of Systems  Constitutional: Positive for fatigue and unexpected weight change (some weight gain). Negative for fever, chills, malaise/fatigue, diaphoresis, activity change and appetite change.  HENT: Positive for hearing loss and tinnitus. Negative for ear pain, nosebleeds, congestion, sore throat, facial swelling, rhinorrhea, sneezing, drooling, mouth sores, trouble swallowing, neck pain, neck stiffness, dental problem, voice change, postnasal drip, sinus pressure and ear discharge.   Eyes: Negative.  Negative for blurred vision.  Respiratory: Positive for apnea (and heavy snoring). Negative for cough, choking, chest tightness, shortness of breath, wheezing and stridor.   Cardiovascular: Negative for chest pain, palpitations, orthopnea, leg swelling and PND.  Gastrointestinal: Negative for nausea, vomiting, abdominal pain, diarrhea, constipation, blood in stool, abdominal distention, anal bleeding and rectal pain.  Genitourinary: Negative for dysuria, urgency, frequency, hematuria, flank pain, decreased urine volume, enuresis, difficulty urinating and dyspareunia.  Musculoskeletal: Positive for arthralgias (knees). Negative for  myalgias, back pain, joint swelling and gait problem.  Skin: Negative for color change, pallor, rash and wound.  Neurological: Negative for dizziness, tremors, seizures, syncope, facial asymmetry, speech difficulty, weakness, light-headedness, numbness and headaches.  Psychiatric/Behavioral: Positive for sleep disturbance (fa and ema, sleep is not restorative). Negative for suicidal ideas, hallucinations, behavioral problems, confusion, self-injury, dysphoric mood, decreased concentration and agitation. The patient is nervous/anxious. The patient is not hyperactive.        Objective:   Physical Exam  Vitals reviewed. Constitutional: She is oriented to person, place, and time. She appears well-developed and well-nourished. No distress.  HENT:  Head: Normocephalic and atraumatic.  Mouth/Throat: Oropharynx is clear and moist. No oropharyngeal exudate.  Eyes: Conjunctivae are normal. Right eye exhibits no discharge. Left eye exhibits no discharge. No scleral icterus.  Neck: Normal range of motion. Neck supple. No JVD present. No tracheal deviation present. No thyromegaly present.  Cardiovascular: Normal rate, regular rhythm, normal heart sounds and intact distal pulses.  Exam reveals no gallop and no friction rub.   No murmur heard. Pulmonary/Chest: Effort normal and breath sounds normal. No stridor. No respiratory distress. She has no wheezes. She has no rales. She exhibits no tenderness.  Abdominal: Soft. Bowel sounds are normal. She exhibits no distension and no mass. There is no tenderness. There is no rebound and no guarding.  Musculoskeletal: Normal range of motion. She exhibits no edema and no tenderness.  Lymphadenopathy:    She has no cervical adenopathy.  Neurological: She is oriented to person, place, and time.  Skin: Skin is warm and dry. No rash noted. She is not diaphoretic. No erythema. No pallor.  Psychiatric: She has a normal mood and affect. Her behavior is normal. Judgment and  thought content normal.      Lab Results  Component Value Date   WBC 6.4 12/19/2011   HGB 12.6 12/19/2011   HCT 37.9 12/19/2011  PLT 141.0* 12/19/2011   GLUCOSE 94 12/19/2011   CHOL 197 12/19/2011   TRIG 117.0 12/19/2011   HDL 69.00 12/19/2011   LDLDIRECT 150.1 04/15/2011   LDLCALC 105* 12/19/2011   ALT 11 12/19/2011   AST 14 12/19/2011   NA 141 12/19/2011   K 3.2* 12/19/2011   CL 102 12/19/2011   CREATININE 0.6 12/19/2011   BUN 11 12/19/2011   CO2 33* 12/19/2011   TSH 4.55 12/19/2011   INR 2.29* 05/07/2010   HGBA1C 5.3 12/19/2011      Assessment & Plan:

## 2012-04-16 NOTE — Assessment & Plan Note (Signed)
I have asked her to have an audiology evaluation

## 2012-04-16 NOTE — Assessment & Plan Note (Signed)
She needs to see sleep medicine

## 2012-04-16 NOTE — Assessment & Plan Note (Signed)
I will check her a1c today 

## 2012-05-25 ENCOUNTER — Encounter: Payer: Self-pay | Admitting: Pulmonary Disease

## 2012-05-25 ENCOUNTER — Ambulatory Visit (INDEPENDENT_AMBULATORY_CARE_PROVIDER_SITE_OTHER): Payer: Medicare HMO | Admitting: Pulmonary Disease

## 2012-05-25 VITALS — BP 150/75 | HR 61 | Temp 98.6°F | Ht 62.0 in | Wt 206.2 lb

## 2012-05-25 DIAGNOSIS — G4730 Sleep apnea, unspecified: Secondary | ICD-10-CM

## 2012-05-25 NOTE — Progress Notes (Signed)
Chief Complaint  Patient presents with  . sleep consult    Ref by Dr. Yetta Barre- pt complains of dreaming all night and feeling "like Im awake" , feels "terrible" in the morning ,    History of Present Illness: [name] is a 78 y.o. female for evaluation of sleep problems.  She has noticed having frequent dreams.  This has been occuring more often.  She also has trouble falling asleep, and staying asleep.  She feels tired all day.  She goes to bed between 9 and 11 pm.  She takes trazodone and alprazolam about 45 minutes before she plans to sleep.  She wakes up several times to use the bathroom.  She wakes up at 6 am, but does not always get out of bed right away.  She used to get frequent headaches, but not as much now.  She does not use anything to help keep her awake.  She is widowed and lives alone, but her husband would tell her that she does snore.  She wakes up with a choking sensation.  She has never woken up to find she is acting out her dreams.  She get confused at times when she wakes up, and can take several minutes to figure out where she is.  The patient denies sleep walking, sleep talking, bruxism, or nightmares.  There is no history of restless legs.  The patient denies sleep hallucinations, sleep paralysis, or cataplexy.  Her Epworth score is 13 out of 24.    Past Medical History  Diagnosis Date  . Anemia     NOS  . Anxiety   . Depression   . Diabetes mellitus     type II  . Hyperlipidemia   . Hypertension   . Osteoarthritis   . Wears dentures   . Bruises easily   . Diarrhea   . Abdominal pain   . Wears glasses   . Chronic headaches     Past Surgical History  Procedure Date  . Appendectomy   . Cholecystectomy   . Abdominal hysterectomy   . Tonsillectomy   . Revision total knee arthroplasty 2011  . Rotator cuff repair 2010  . Tonsillectomy     Current Outpatient Prescriptions on File Prior to Visit  Medication Sig Dispense Refill  . ALPRAZolam  (XANAX) 1 MG tablet Take 1 tablet (1 mg total) by mouth 3 (three) times daily as needed for sleep.  90 tablet  5  . amLODipine (NORVASC) 10 MG tablet take 1 tablet by mouth once daily  30 tablet  11  . Cholecalciferol (VITAMIN D3) 1000 UNITS CAPS Take 2 capsules by mouth daily.        . Enalapril-Hydrochlorothiazide 5-12.5 MG per tablet take 2 tablets by mouth once daily  60 tablet  11  . HYDROcodone-acetaminophen (VICODIN) 5-500 MG per tablet Take 1 tablet by mouth every 6 (six) hours as needed. Knee pain  50 tablet  5  . pravastatin (PRAVACHOL) 80 MG tablet take 1 tablet by mouth once daily for cholesterol  30 tablet  11  . traZODone (DESYREL) 100 MG tablet Take 0.5 tablets (50 mg total) by mouth at bedtime.  90 tablet  3  . traZODone (DESYREL) 100 MG tablet Take 1 tablet (100 mg total) by mouth at bedtime.  90 tablet  3    Allergies  Allergen Reactions  . Metformin Diarrhea    REACTION: diarrhea IBS    Family History  Problem Relation Age of Onset  .  Alcohol abuse Other   . Hypertension Other   . Kidney disease Other   . Mental illness Other   . Cancer Father     prostate cancer  . Heart disease Sister   . Heart disease Brother   . Emphysema Brother     History  Substance Use Topics  . Smoking status: Never Smoker   . Smokeless tobacco: Never Used  . Alcohol Use: No   Review of Systems  Constitutional: Positive for activity change and fatigue. Negative for fever, chills, diaphoresis, appetite change and unexpected weight change.  HENT: Positive for trouble swallowing. Negative for hearing loss, ear pain, nosebleeds, congestion, sore throat, facial swelling, rhinorrhea, sneezing, mouth sores, neck pain, neck stiffness, dental problem, voice change, postnasal drip, sinus pressure, tinnitus and ear discharge.   Eyes: Negative for photophobia, discharge, itching and visual disturbance.  Respiratory: Negative for apnea, cough, choking, chest tightness, shortness of breath,  wheezing and stridor.   Cardiovascular: Negative for chest pain, palpitations and leg swelling.  Gastrointestinal: Positive for nausea. Negative for vomiting, abdominal pain, constipation, blood in stool and abdominal distention.  Genitourinary: Negative for dysuria, urgency, frequency, hematuria, flank pain, decreased urine volume and difficulty urinating.  Musculoskeletal: Negative for myalgias, back pain, joint swelling, arthralgias and gait problem.  Skin: Negative for color change, pallor and rash.  Neurological: Positive for dizziness. Negative for tremors, seizures, syncope, speech difficulty, weakness, light-headedness, numbness and headaches.  Hematological: Negative for adenopathy. Bruises/bleeds easily.  Psychiatric/Behavioral: Negative for confusion, disturbed wake/sleep cycle and agitation. The patient is not nervous/anxious.     Physical Exam: Filed Vitals:   05/25/12 1609  BP: 150/75  Pulse: 61  Temp: 98.6 F (37 C)  TempSrc: Oral  Height: 5\' 2"  (1.575 m)  Weight: 206 lb 3.2 oz (93.532 kg)  SpO2: 95%  ,  Current Encounter SPO2  05/25/12 1609 95%  04/16/12 1308 95%  12/19/11 0809 93%   Wt Readings from Last 3 Encounters:  05/25/12 206 lb 3.2 oz (93.532 kg)  04/16/12 204 lb 8 oz (92.761 kg)  12/19/11 200 lb 8 oz (90.946 kg)   Body mass index is 37.71 kg/(m^2).  General - Obese ENT - No sinus tenderness, no oral exudate, MP 3, no LAN, no thyromegaly Cardiac - S1S2 regular, no murmur, no edema Chest - Good air entry, normal respiratory excursion, no wheeze/rales/dullness Back - No focal tenderness Abd - Soft, non-tender, normal bowel sounds Ext - good pulses, and motor strength Neuro - Cranial nerves are normal. PERLA. EOM's intact. Skin - no discernible active dermatitis, erythema, urticaria or inflammatory process. Psych - normal mood, and behavior.    Assessment/Plan:  Coralyn Helling, MD [facility]/Sleep Pager:   [phone] 05/26/2012, 12:22 PM  Patient Instructions  Will arrange for sleep study Will call to schedule follow up after sleep study reviewed

## 2012-05-25 NOTE — Patient Instructions (Signed)
Will arrange for sleep study Will call to schedule follow up after sleep study reviewed 

## 2012-05-25 NOTE — Assessment & Plan Note (Signed)
She has snoring, sleep disruption, and daytime sleepiness.   She has refractory hypertension on several different medications.  I am concerned she could have sleep apnea.  I have explained how sleep apnea can affect the patient's health.  Driving precautions and importance of weight loss were discussed.  Treatment options for sleep apnea were reviewed.  To further assess will arrange for in lab sleep study.  Explained that she can use trazodone and alprazolam on night of study if needed.  Will re-assess her need for these medications after her sleep study.

## 2012-05-25 NOTE — Progress Notes (Deleted)
  Subjective:    Patient ID: [name], female    DOB: 30-Jan-1933, 78 y.o.   MRN: [id number]  HPI    Review of Systems  Constitutional: Positive for activity change and fatigue. Negative for fever, chills, diaphoresis, appetite change and unexpected weight change.  HENT: Positive for trouble swallowing. Negative for hearing loss, ear pain, nosebleeds, congestion, sore throat, facial swelling, rhinorrhea, sneezing, mouth sores, neck pain, neck stiffness, dental problem, voice change, postnasal drip, sinus pressure, tinnitus and ear discharge.   Eyes: Negative for photophobia, discharge, itching and visual disturbance.  Respiratory: Negative for apnea, cough, choking, chest tightness, shortness of breath, wheezing and stridor.   Cardiovascular: Negative for chest pain, palpitations and leg swelling.  Gastrointestinal: Positive for nausea. Negative for vomiting, abdominal pain, constipation, blood in stool and abdominal distention.  Genitourinary: Negative for dysuria, urgency, frequency, hematuria, flank pain, decreased urine volume and difficulty urinating.  Musculoskeletal: Negative for myalgias, back pain, joint swelling, arthralgias and gait problem.  Skin: Negative for color change, pallor and rash.  Neurological: Positive for dizziness. Negative for tremors, seizures, syncope, speech difficulty, weakness, light-headedness, numbness and headaches.  Hematological: Negative for adenopathy. Bruises/bleeds easily.  Psychiatric/Behavioral: Negative for confusion, disturbed wake/sleep cycle and agitation. The patient is not nervous/anxious.        Objective:   Physical Exam        Assessment & Plan:

## 2012-06-08 ENCOUNTER — Encounter: Payer: Self-pay | Admitting: Internal Medicine

## 2012-06-15 ENCOUNTER — Ambulatory Visit (HOSPITAL_BASED_OUTPATIENT_CLINIC_OR_DEPARTMENT_OTHER): Payer: Medicare HMO | Attending: Pulmonary Disease | Admitting: Radiology

## 2012-06-15 VITALS — Ht 62.0 in | Wt 200.0 lb

## 2012-06-15 DIAGNOSIS — G4730 Sleep apnea, unspecified: Secondary | ICD-10-CM

## 2012-06-15 DIAGNOSIS — I10 Essential (primary) hypertension: Secondary | ICD-10-CM | POA: Insufficient documentation

## 2012-06-15 DIAGNOSIS — Z79899 Other long term (current) drug therapy: Secondary | ICD-10-CM | POA: Insufficient documentation

## 2012-06-15 DIAGNOSIS — G4733 Obstructive sleep apnea (adult) (pediatric): Secondary | ICD-10-CM

## 2012-06-29 ENCOUNTER — Telehealth: Payer: Self-pay | Admitting: Pulmonary Disease

## 2012-06-29 DIAGNOSIS — G4733 Obstructive sleep apnea (adult) (pediatric): Secondary | ICD-10-CM

## 2012-06-29 DIAGNOSIS — G4730 Sleep apnea, unspecified: Secondary | ICD-10-CM

## 2012-06-29 DIAGNOSIS — G4736 Sleep related hypoventilation in conditions classified elsewhere: Secondary | ICD-10-CM

## 2012-06-29 NOTE — Telephone Encounter (Signed)
PSG 06/15/12>>AHI 5.7, SpO2 low 83%.  Added 1 liter oxygen.  Will have my nurse schedule ROV to review results.

## 2012-06-30 NOTE — Telephone Encounter (Signed)
Contacted patient, scheduled rov for 07/09/12 at 1:30 to discuss results with Dr. Craige Cotta.  Patient aware and ok with date and time. Nothing further needed.

## 2012-06-30 NOTE — Procedures (Signed)
[name], CRUMM NO.:  [id number]  MEDICAL RECORD NO.:  [id number]          PATIENT TYPE:  OUT  LOCATION:  SLEEP CENTER                 FACILITY:  [facility]  PHYSICIAN:  Coralyn Helling, MD        DATE OF BIRTH:  [birth year]  DATE OF STUDY:  06/15/2012                           NOCTURNAL POLYSOMNOGRAM  REFERRING PHYSICIAN:  Coralyn Helling, MD  FACILITY:  [facility].  INDICATION FOR STUDY:  Ms. Coyne is a 77 year old female who has a history of refractory hypertension.  She also has symptoms of snoring, sleep disruption, and daytime sleepiness.  She was therefore referred to the Sleep Lab for evaluation of hypersomnia with obstructive sleep apnea.  Height is 5 feet and 2 inches, weight is 200 pounds, BMI is 37, neck size is 15 inches.  EPWORTH SLEEPINESS SCORE:  12.  MEDICATIONS:  Xanax, Norvasc, vitamin D3, enalapril, hydrochlorothiazide, Vicodin, Pravachol, and trazodone.  SLEEP ARCHITECTURE:  Total recording time was 420 minutes, total sleep time was 346 minutes, sleep efficiency was 82%, sleep latency was 8 minutes, REM latency was 177 minutes.  The study was notable for a reduction in the percentage of slow-wave sleep and she slept predominantly in the nonsupine position.  RESPIRATORY DATA:  The average respiratory rate was 16.  Loud snoring was noted by the technician.  The overall apnea-hypopnea index was 5.7. The events were exclusively obstructive in nature.  OXYGEN DATA:  The baseline oxygenation was 93%.  The oxygen saturation nadir was 83%.  The patient had oxygen desaturations in the absence of respiratory events and she was empirically started on 1 liter of supplemental oxygen.  CARDIAC DATA:  The average heart rate 56 and the rhythm strip showed sinus rhythm.  MOVEMENT-PARASOMNIA:  The periodic limb movement index was 0 and the patient had one restroom trip.  IMPRESSIONS-RECOMMENDATIONS:  This study shows evidence for  mild obstructive sleep apnea with an apnea-hypopnea index of 5.7, and oxygen saturation nadir of 83%.  She had oxygen desaturations in the absence of respiratory events.  This would be suggestive of sleep related hyperventilation.  In addition to diet, excise, and weight reduction, I would recommend that the patient return to the Sleep Lab for a CPAP titration study.  I would recommend that the patient start the study without the use of supplemental oxygen and then add supplemental oxygen as needed.     Coralyn Helling, MD Diplomat, American Board of Sleep Medicine    VS/MEDQ  D:  06/29/2012 14:55:35  T:  06/30/2012 04:36:49  Job:  [id number]

## 2012-07-09 ENCOUNTER — Encounter: Payer: Self-pay | Admitting: Pulmonary Disease

## 2012-07-09 ENCOUNTER — Other Ambulatory Visit: Payer: Self-pay | Admitting: Orthopedic Surgery

## 2012-07-09 ENCOUNTER — Ambulatory Visit
Admission: RE | Admit: 2012-07-09 | Discharge: 2012-07-09 | Disposition: A | Discharge status: Home / Self Care | Payer: Medicare HMO | Source: Ambulatory Visit | Attending: Orthopedic Surgery | Admitting: Orthopedic Surgery

## 2012-07-09 ENCOUNTER — Ambulatory Visit (INDEPENDENT_AMBULATORY_CARE_PROVIDER_SITE_OTHER): Payer: Medicare HMO | Admitting: Pulmonary Disease

## 2012-07-09 VITALS — BP 118/68 | HR 60 | Temp 98.2°F | Ht 61.0 in | Wt 205.6 lb

## 2012-07-09 DIAGNOSIS — R52 Pain, unspecified: Secondary | ICD-10-CM

## 2012-07-09 DIAGNOSIS — G4733 Obstructive sleep apnea (adult) (pediatric): Secondary | ICD-10-CM

## 2012-07-09 NOTE — Patient Instructions (Signed)
Will arrange for sleep study Will call to schedule follow up after sleep study reviewed 

## 2012-07-09 NOTE — Assessment & Plan Note (Signed)
She has mild sleep apnea.  She may also have sleep related hypoventilation.  I have reviewed her sleep test results with the patient.  Explained how sleep apnea can affect the patient's health.  Driving precautions and importance of weight loss were discussed.  Treatment options for sleep apnea were reviewed.  Will arrange for in lab titration study.  Will determine if she needs only CPAP, or if she needs BPAP and/or supplemental oxygen.  Will call her with results to arrange set up.

## 2012-07-09 NOTE — Progress Notes (Signed)
Chief Complaint  Patient presents with  . Follow-up    pt here to discuss sleep study results    History of Present Illness: [name] is a 78 y.o. female with mild OSA, and possible sleep related hypoventilation.  She is here to review PSG from 06/15/12>>AHI 5.7, SpO2 low 83%. Added 1 liter oxygen.    Past Medical History  Diagnosis Date  . Anemia     NOS  . Anxiety   . Depression   . Diabetes mellitus     type II  . Hyperlipidemia   . Hypertension   . Osteoarthritis   . Wears dentures   . Bruises easily   . Diarrhea   . Abdominal pain   . Wears glasses   . Chronic headaches     Past Surgical History  Procedure Date  . Appendectomy   . Cholecystectomy   . Abdominal hysterectomy   . Tonsillectomy   . Revision total knee arthroplasty 2011  . Rotator cuff repair 2010  . Tonsillectomy     Outpatient Encounter Prescriptions as of 07/09/2012  Medication Sig Dispense Refill  . ALPRAZolam (XANAX) 1 MG tablet Take 1 tablet (1 mg total) by mouth 3 (three) times daily as needed for sleep.  90 tablet  5  . amLODipine (NORVASC) 10 MG tablet take 1 tablet by mouth once daily  30 tablet  11  . Cholecalciferol (VITAMIN D3) 1000 UNITS CAPS Take 2 capsules by mouth daily.        . Enalapril-Hydrochlorothiazide 5-12.5 MG per tablet take 2 tablets by mouth once daily  60 tablet  11  . HYDROcodone-acetaminophen (VICODIN) 5-500 MG per tablet Take 1 tablet by mouth every 6 (six) hours as needed. Knee pain  50 tablet  5  . pravastatin (PRAVACHOL) 80 MG tablet take 1 tablet by mouth once daily for cholesterol  30 tablet  11  . traZODone (DESYREL) 100 MG tablet Take 0.5 tablets (50 mg total) by mouth at bedtime.  90 tablet  3  . DISCONTD: traZODone (DESYREL) 100 MG tablet Take 1 tablet (100 mg total) by mouth at bedtime.  90 tablet  3    Allergies  Allergen Reactions  . Metformin Diarrhea    REACTION: diarrhea IBS    Physical Exam:  Filed Vitals:   07/09/12 1328  BP: 118/68    Pulse: 60  Temp: 98.2 F (36.8 C)  TempSrc: Oral  Height: 5\' 1"  (1.549 m)  Weight: 205 lb 9.6 oz (93.26 kg)  SpO2: 97%    Current Encounter SPO2  07/09/12 1328 97%  05/25/12 1609 95%  04/16/12 1308 95%     Body mass index is 38.85 kg/(m^2). Wt Readings from Last 2 Encounters:  07/09/12 205 lb 9.6 oz (93.26 kg)  06/15/12 200 lb (90.719 kg)    General - Obese  ENT - No sinus tenderness, no oral exudate, MP 3, no LAN, no thyromegaly  Cardiac - S1S2 regular, no murmur, no edema  Chest - Good air entry, normal respiratory excursion, no wheeze/rales/dullness  Back - No focal tenderness  Abd - Soft, non-tender, normal bowel sounds  Ext - good pulses, and motor strength  Neuro - Cranial nerves are normal. PERLA. EOM's intact.  Skin - no discernible active dermatitis, erythema, urticaria or inflammatory process.  Psych - normal mood, and behavior.  Assessment/Plan:  Lisa Helling, MD [facility]/Sleep Pager:  [phone] 07/09/2012, 1:41 PM

## 2012-07-21 ENCOUNTER — Other Ambulatory Visit: Payer: Self-pay | Admitting: Orthopedic Surgery

## 2012-07-21 DIAGNOSIS — M25512 Pain in left shoulder: Secondary | ICD-10-CM

## 2012-07-28 ENCOUNTER — Ambulatory Visit
Admission: RE | Admit: 2012-07-28 | Discharge: 2012-07-28 | Disposition: A | Discharge status: Home / Self Care | Payer: Medicare HMO | Source: Ambulatory Visit | Attending: Orthopedic Surgery | Admitting: Orthopedic Surgery

## 2012-07-28 DIAGNOSIS — M25512 Pain in left shoulder: Secondary | ICD-10-CM

## 2012-07-29 ENCOUNTER — Ambulatory Visit (HOSPITAL_BASED_OUTPATIENT_CLINIC_OR_DEPARTMENT_OTHER): Payer: Medicare HMO | Attending: Pulmonary Disease | Admitting: Radiology

## 2012-07-29 VITALS — Ht 62.0 in | Wt 200.0 lb

## 2012-07-29 DIAGNOSIS — G4733 Obstructive sleep apnea (adult) (pediatric): Secondary | ICD-10-CM

## 2012-07-30 ENCOUNTER — Telehealth: Payer: Self-pay | Admitting: Pulmonary Disease

## 2012-07-30 DIAGNOSIS — G4733 Obstructive sleep apnea (adult) (pediatric): Secondary | ICD-10-CM

## 2012-07-30 DIAGNOSIS — G4736 Sleep related hypoventilation in conditions classified elsewhere: Secondary | ICD-10-CM

## 2012-07-30 NOTE — Telephone Encounter (Signed)
CPAP titration 07/29/12>>CPAP 10 cm H2O with 1 liter oxygen.  Results d/w pt over phone.  I have order CPAP and supplemental oxygen set up through [facility].  Will have my nurse schedule ROV in 2 months after CPAP set up.

## 2012-07-30 NOTE — Telephone Encounter (Signed)
I spoke with pt and she stated she had her sleep study done last night over at the sleep center. She stated VS advised her when she was scheduled for this that she would be set up on cpap before she went out of town. She is leaving tomorrow morning to go to [place] a [place]. Pt wanted this addressed this morning if possible so she could get this set up today. Please advise Dr. Craige Cotta thanks

## 2012-07-31 ENCOUNTER — Telehealth: Payer: Self-pay | Admitting: Pulmonary Disease

## 2012-07-31 NOTE — Telephone Encounter (Signed)
I spoke with [name]- was advised insurance will not cover the oxygen fr pt cpap bc the sat's are over 31 days old from her sleep study in July. I advised them pt had sleep study 07/29/12 reason being this set up. They did not receive this. I have faxed this over to [phone]. Nothing further was needed

## 2012-07-31 NOTE — Procedures (Signed)
NAMEISELA, STANTZ NO.:  [id number]  MEDICAL RECORD NO.:  [id number]          PATIENT TYPE:  OUT  LOCATION:  SLEEP CENTER                 [facility]ACILITY[facility]  PHYSICIAN:  Coralyn Helling, MD        DATE OF BIRTH:  [birth year]  DATE OF STUDY:  07/29/2012                           NOCTURNAL POLYSOMNOGRAM  REFERRING PHYSICIAN:  Coralyn Helling, MD  FACILITY:  [facility].  INDICATION FOR STUDY:  Ms. Gorney is a 78 year old female who has a history of refractory hypertension.  She was also found to have obstructive sleep apnea and sleep-related hypoventilation after having a sleep study on June 15, 2012.  She was found to have an apnea-hypopnea index of 5.7 with an oxygen saturation nadir of 83%.  She returned to the sleep lab for a CPAP titration study.  Height 5 feet 2 inches, weight is 200 pounds, BMI is 37, neck size is 15 inches.  EPWORTH SLEEPINESS SCORE:  12.  MEDICATIONS:  Xanax, Norvasc, enalapril, hydrochlorothiazide, Vicodin, Pravachol, and Desyrel.  SLEEP ARCHITECTURE:  Total recording time was 442 minutes, total sleep time was 377 minutes, sleep efficiency was 85%, sleep latency was 9.5 minutes.  REM latency was 97 minutes.  The study was notable for the lack of slow-wave sleep.  She slept in both the supine and nonsupine positions.  RESPIRATORY DATA:  The average respiratory rate was 16.  The patient was started on CPAP of 5 and increased to 10 cm of water.  With CPAP at 10 cm of water, the apnea-hypopnea index was reduced to 0.4.  At this pressure setting, she was observed in REM sleep and supine sleep.  OXYGEN DATA:  The baseline oxygenation was 95%.  The oxygen saturation nadir was 84%.  The patient continued to have episodes of oxygen desaturation in the absence of apneic respiratory events.  She was therefore started on 1 L of supplemental oxygen.  The combination of CPAP at 10 cm of water and 1 L of supplemental oxygen.  She was able  to maintain her oxygenation.  CARDIAC DATA:  The average heart rate was 58 and the rhythm strip showed normal sinus rhythm.  MOVEMENT-PARASOMNIA:  The periodic limb movement index was 0 and the patient had 2 restroom trips.  IMPRESSIONS-RECOMMENDATIONS:  This was a successful CPAP titration study.  She did well with CPAP at 10 cm of water.  At this pressure setting, she was observed in REM sleep and supine sleep.  She was fitted with a small size ResMed Quattro FX full face mask.  She did also have evidence for sleep-related hyperventilation.  This was well controlled with a combination of CPAP at 10 cm of water and 1 L of supplemental oxygen.  In addition to diet, exercise, and weight reduction, I would recommend the patient be started on CPAP at 10 cm of water with 1 L of supplemental oxygen and monitored for her clinical response.     Coralyn Helling, MD Diplomat, American Board of Sleep Medicine    VS/MEDQ  D:  07/30/2012 11:58:12  T:  07/31/2012 03:42:25  Job:  [id number]

## 2012-08-05 NOTE — Telephone Encounter (Signed)
I spoke with pt and is scheduled to come in and see VS 10/07/12 at 1:45

## 2012-08-13 ENCOUNTER — Ambulatory Visit (INDEPENDENT_AMBULATORY_CARE_PROVIDER_SITE_OTHER): Payer: Medicare HMO | Admitting: Internal Medicine

## 2012-08-20 ENCOUNTER — Ambulatory Visit (INDEPENDENT_AMBULATORY_CARE_PROVIDER_SITE_OTHER): Payer: Medicare HMO | Admitting: Internal Medicine

## 2012-08-20 ENCOUNTER — Other Ambulatory Visit (INDEPENDENT_AMBULATORY_CARE_PROVIDER_SITE_OTHER): Payer: Medicare HMO

## 2012-08-20 ENCOUNTER — Encounter: Payer: Self-pay | Admitting: Internal Medicine

## 2012-08-20 VITALS — BP 130/62 | HR 64 | Temp 97.6°F | Resp 16 | Wt 202.0 lb

## 2012-08-20 DIAGNOSIS — F329 Major depressive disorder, single episode, unspecified: Secondary | ICD-10-CM

## 2012-08-20 DIAGNOSIS — I10 Essential (primary) hypertension: Secondary | ICD-10-CM

## 2012-08-20 DIAGNOSIS — Z23 Encounter for immunization: Secondary | ICD-10-CM

## 2012-08-20 DIAGNOSIS — F411 Generalized anxiety disorder: Secondary | ICD-10-CM

## 2012-08-20 LAB — BASIC METABOLIC PANEL
Chloride: 102 mEq/L (ref 96–112)
Creatinine, Ser: 0.6 mg/dL (ref 0.4–1.2)
GFR: 137.65 mL/min (ref >60.00)
Potassium: 3.4 mEq/L — ABNORMAL LOW (ref 3.5–5.1)

## 2012-08-20 MED ORDER — TRAZODONE HCL 100 MG PO TABS: 100.0000 mg | ORAL_TABLET | Freq: Every day | ORAL | Status: DC

## 2012-08-20 MED ORDER — ESTROGENS, CONJUGATED 0.625 MG/GM VA CREA: TOPICAL_CREAM | Freq: Every day | VAGINAL | Status: DC

## 2012-08-20 MED ORDER — ALPRAZOLAM 1 MG PO TABS: 1.0000 mg | ORAL_TABLET | Freq: Three times a day (TID) | ORAL | Status: DC | PRN

## 2012-08-20 NOTE — Patient Instructions (Signed)

## 2012-08-20 NOTE — Progress Notes (Signed)
Subjective:    Patient ID: [name], female    DOB: 29-Dec-1932, 78 y.o.   MRN: [id number]  Hypertension This is a chronic problem. The current episode started more than 1 year ago. The problem has been gradually improving since onset. The problem is controlled. Associated symptoms include anxiety. Pertinent negatives include no blurred vision, chest pain, headaches, malaise/fatigue, neck pain, orthopnea, palpitations, peripheral edema, PND, shortness of breath or sweats. Agents associated with hypertension include estrogens. Past treatments include ACE inhibitors, calcium channel blockers and diuretics. The current treatment provides moderate improvement. Compliance problems include exercise and diet.  Identifiable causes of hypertension include sleep apnea.      Review of Systems  Constitutional: Negative for fever, chills, malaise/fatigue, diaphoresis, activity change, appetite change, fatigue and unexpected weight change.  HENT: Negative.  Negative for neck pain.   Eyes: Negative.  Negative for blurred vision.  Respiratory: Negative for cough, chest tightness, shortness of breath, wheezing and stridor.   Cardiovascular: Negative for chest pain, palpitations, orthopnea, leg swelling and PND.  Gastrointestinal: Negative for nausea, vomiting, abdominal pain, diarrhea, constipation and anal bleeding.  Genitourinary: Negative.   Musculoskeletal: Negative.   Skin: Negative.   Neurological: Negative for dizziness, tremors, seizures, syncope, facial asymmetry, speech difficulty, weakness, light-headedness, numbness and headaches.  Hematological: Negative for adenopathy. Does not bruise/bleed easily.  Psychiatric/Behavioral: Positive for disturbed wake/sleep cycle and dysphoric mood. Negative for suicidal ideas, hallucinations, behavioral problems, confusion, self-injury, decreased concentration and agitation. The patient is nervous/anxious. The patient is not hyperactive.        Objective:     Physical Exam  Vitals reviewed. Constitutional: She is oriented to person, place, and time. She appears well-developed and well-nourished. No distress.  HENT:  Head: Normocephalic and atraumatic.  Mouth/Throat: Oropharynx is clear and moist. No oropharyngeal exudate.  Eyes: Conjunctivae normal are normal. Right eye exhibits no discharge. Left eye exhibits no discharge. No scleral icterus.  Neck: Normal range of motion. Neck supple. No JVD present. No tracheal deviation present. No thyromegaly present.  Cardiovascular: Normal rate, regular rhythm, normal heart sounds and intact distal pulses.  Exam reveals no gallop and no friction rub.   No murmur heard. Pulmonary/Chest: Effort normal and breath sounds normal. No stridor. No respiratory distress. She has no wheezes. She has no rales. She exhibits no tenderness.  Abdominal: Soft. Bowel sounds are normal. She exhibits no distension and no mass. There is no tenderness. There is no rebound and no guarding.  Musculoskeletal: Normal range of motion. She exhibits no edema and no tenderness.  Lymphadenopathy:    She has no cervical adenopathy.  Neurological: She is oriented to person, place, and time. She displays normal reflexes. No cranial nerve deficit. She exhibits normal muscle tone. Coordination normal.  Skin: Skin is warm and dry. No rash noted. She is not diaphoretic. No erythema. No pallor.  Psychiatric: She has a normal mood and affect. Her behavior is normal. Judgment and thought content normal.     Lab Results  Component Value Date   WBC 5.6 04/16/2012   HGB 12.6 04/16/2012   HCT 38.6 04/16/2012   PLT 151.0 04/16/2012   GLUCOSE 120* 04/16/2012   CHOL 197 12/19/2011   TRIG 117.0 12/19/2011   HDL 69.00 12/19/2011   LDLDIRECT 150.1 04/15/2011   LDLCALC 105* 12/19/2011   ALT 11 04/16/2012   AST 17 04/16/2012   NA 141 04/16/2012   K 3.5 04/16/2012   CL 103 04/16/2012   CREATININE 0.6 04/16/2012  BUN 12 04/16/2012   CO2 28 04/16/2012   TSH  2.95 04/16/2012   INR 2.29* 05/07/2010   HGBA1C 5.3 04/16/2012       Assessment & Plan:

## 2012-08-23 NOTE — Assessment & Plan Note (Signed)
Continue trazodone 

## 2012-08-23 NOTE — Assessment & Plan Note (Signed)
She has adequate BP control, I will check her lytes today

## 2012-09-03 ENCOUNTER — Encounter (HOSPITAL_COMMUNITY): Payer: Self-pay | Admitting: Pharmacy Technician

## 2012-09-04 ENCOUNTER — Other Ambulatory Visit: Payer: Self-pay | Admitting: Orthopedic Surgery

## 2012-09-09 ENCOUNTER — Encounter (HOSPITAL_COMMUNITY)
Admission: RE | Admit: 2012-09-09 | Discharge: 2012-09-09 | Disposition: A | Discharge status: Home / Self Care | Payer: Medicare HMO | Source: Ambulatory Visit | Attending: Orthopedic Surgery | Admitting: Orthopedic Surgery

## 2012-09-09 ENCOUNTER — Encounter (HOSPITAL_COMMUNITY): Payer: Self-pay

## 2012-09-09 HISTORY — DX: Sleep apnea, unspecified: G47.30

## 2012-09-09 HISTORY — DX: Shortness of breath: R06.02

## 2012-09-09 HISTORY — DX: Idiopathic sleep related nonobstructive alveolar hypoventilation: G47.34

## 2012-09-09 HISTORY — DX: Inflammatory liver disease, unspecified: K75.9

## 2012-09-09 LAB — URINALYSIS, ROUTINE W REFLEX MICROSCOPIC
Bilirubin Urine: NEGATIVE
Glucose, UA: NEGATIVE mg/dL
Nitrite: NEGATIVE
Specific Gravity, Urine: 1.013 (ref 1.005–1.030)
pH: 7.5 (ref 5.0–8.0)

## 2012-09-09 LAB — COMPREHENSIVE METABOLIC PANEL
ALT: 12 U/L (ref 0–35)
Albumin: 3.3 g/dL — ABNORMAL LOW (ref 3.5–5.2)
Calcium: 9.4 mg/dL (ref 8.4–10.5)
GFR calc Af Amer: >90 mL/min (ref >90)
Glucose, Bld: 94 mg/dL (ref 70–99)
Sodium: 138 mEq/L (ref 135–145)
Total Protein: 6.5 g/dL (ref 6.0–8.3)

## 2012-09-09 LAB — CBC
Hemoglobin: 12.7 g/dL (ref 12.0–15.0)
MCH: 30.5 pg (ref 26.0–34.0)
MCHC: 32.2 g/dL (ref 30.0–36.0)
RDW: 12.8 % (ref 11.5–15.5)

## 2012-09-09 LAB — TYPE AND SCREEN: ABO/RH(D): A POS

## 2012-09-09 LAB — SURGICAL PCR SCREEN: Staphylococcus aureus: NEGATIVE

## 2012-09-09 NOTE — Pre-Procedure Instructions (Signed)
20 [name]  09/09/2012   Your procedure is scheduled on: Thursday 09/17/12    Report to [facility] at 730 AM.  Call this number if you have problems the morning of surgery: [phone]   Remember:   Do not eat food OR DRINK After Midnight.    Take these medicines the morning of surgery with A SIP OF WATER: XANAX, AMLODIPINE(NORVASC), PREMARIN, VICODIN OR PERCOCET IF NEEDED    Do not wear jewelry, make-up or nail polish.  Do not wear lotions, powders, or perfumes. You may wear deodorant.  Do not shave 48 hours prior to surgery. Men may shave face and neck.  Do not bring valuables to the hospital.  Contacts, dentures or bridgework may not be worn into surgery.  Leave suitcase in the car. After surgery it may be brought to your room.  For patients admitted to the hospital, checkout time is 11:00 AM the day of discharge.   Patients discharged the day of surgery will not be allowed to drive home.  Name and phone number of your driver:  Special Instructions: Shower using CHG 2 nights before surgery and the night before surgery.  If you shower the day of surgery use CHG.  Use special wash - you have one bottle of CHG for all showers.  You should use approximately 1/3 of the bottle for each shower.   Please read over the following fact sheets that you were given: Pain Booklet, Coughing and Deep Breathing, Blood Transfusion Information, MRSA Information and Surgical Site Infection Prevention

## 2012-09-09 NOTE — Progress Notes (Signed)
UNABLE TO LOCATE WHERE PATIENT HAD STRESS TEST DONE, SHE COULD NOT REMEMBER.  WAS NOT DONE AT [facility], NOR [place].  LEFT MESSAGE AT [facility] TO FAX IF THEY HAVE.

## 2012-09-10 NOTE — Consult Note (Addendum)
Anesthesia chart review:  Patient is a 78 year old female scheduled for left shoulder arthroscopy, acromioplasty and decompression by Dr. Montez Morita on 09/17/2012. History includes nonsmoker, obesity, hyperlipidemia, hypertension, osteoarthritis, obstructive sleep apnea with the use of nighttime oxygen (Dr. Craige Cotta), diabetes mellitus type 2 diet controlled, depression, chronic headaches, anemia, hepatitis (age 63's; not specified).  PCP is Dr. Sanda Linger.  Labs noted.   Chest x-ray on 09/09/2012 showed no radiographic evidence of acute cardiopulmonary process.  EKG on 09/09/12 showed NSR, anteroseptal infarct (age undetermined), lateral T wave abnormality, consider ischemia.  Her lateral T wave abnormality is slightly more apparent than on her 05/01/10 and 07/15/07 EKGs, but overall the interpreting Cardiologist did not feel that it was significantly changed since her prior pre-operative EKG on 05/01/10.    I called and spoke with Ms. Shon Baton.  She denies chest pain, SOB.  She considers herself very active for a 78 year old.  She is able to do her own shopping, house cleaning including vacuuming, and can even do some painting.  She has not had a stress test in years.  She does think that she was seen by a cardiologist within the past three years, but it sounds like perhaps for either an echo or abdominal ultrasound.  She says everything was "fine" and that she was told she did not need further cardiology follow-up.  It may have been with Dr. Nadara Eaton.  I'll try his office tomorrow.  I did review above including last two EKGs with Anesthesiologist Dr. Gypsy Balsam who agrees that since she is asymptomatic and with an overall stable EKG than would plan to proceed unless with new CV/CHF symptoms.  Shonna Chock, PA-C 09/10/12 1747  Update: 09/11/12 0920 Yesterday patient told me a general surgeon referred her to a cardiologist on [address].  She saw Dr. Luisa Hart in July 2012.  There is mention of gastroenterology  (communication with Dr. Loreta Ave and Dr. Lina Sar, but no formal appointment), but no mention of referral to a cardiologist.  There is no record of her as a patient at [facility], [facility], [facility], Dr. York Spaniel, Dr. Algie Coffer, Dr. Sharyn Lull, or Dr. Verl Dicker office.

## 2012-09-16 MED ORDER — CEFAZOLIN SODIUM-DEXTROSE 2-3 GM-% IV SOLR
2.0000 g | INTRAVENOUS | Status: DC
  Filled 2012-09-16: qty 50

## 2012-09-17 ENCOUNTER — Observation Stay (HOSPITAL_COMMUNITY)
Admission: RE | Admit: 2012-09-17 | Discharge: 2012-09-18 | Disposition: A | Discharge status: Home / Self Care | Payer: Medicare HMO | Source: Ambulatory Visit | Attending: Orthopedic Surgery | Admitting: Orthopedic Surgery

## 2012-09-17 ENCOUNTER — Encounter (HOSPITAL_COMMUNITY): Payer: Self-pay | Admitting: Vascular Surgery

## 2012-09-17 ENCOUNTER — Ambulatory Visit (HOSPITAL_COMMUNITY): Payer: Medicare HMO | Admitting: Vascular Surgery

## 2012-09-17 ENCOUNTER — Encounter (HOSPITAL_COMMUNITY): Payer: Self-pay | Admitting: Rehabilitation

## 2012-09-17 ENCOUNTER — Encounter (HOSPITAL_COMMUNITY)
Admission: RE | Disposition: A | Discharge status: Home / Self Care | Payer: Self-pay | Source: Ambulatory Visit | Attending: Orthopedic Surgery

## 2012-09-17 DIAGNOSIS — G4730 Sleep apnea, unspecified: Secondary | ICD-10-CM | POA: Insufficient documentation

## 2012-09-17 DIAGNOSIS — M659 Unspecified synovitis and tenosynovitis, unspecified site: Secondary | ICD-10-CM | POA: Insufficient documentation

## 2012-09-17 DIAGNOSIS — Z01818 Encounter for other preprocedural examination: Secondary | ICD-10-CM | POA: Insufficient documentation

## 2012-09-17 DIAGNOSIS — E119 Type 2 diabetes mellitus without complications: Secondary | ICD-10-CM | POA: Insufficient documentation

## 2012-09-17 DIAGNOSIS — M67919 Unspecified disorder of synovium and tendon, unspecified shoulder: Principal | ICD-10-CM | POA: Insufficient documentation

## 2012-09-17 DIAGNOSIS — R0602 Shortness of breath: Secondary | ICD-10-CM | POA: Insufficient documentation

## 2012-09-17 DIAGNOSIS — I10 Essential (primary) hypertension: Secondary | ICD-10-CM | POA: Insufficient documentation

## 2012-09-17 DIAGNOSIS — M25819 Other specified joint disorders, unspecified shoulder: Secondary | ICD-10-CM | POA: Insufficient documentation

## 2012-09-17 DIAGNOSIS — Z01810 Encounter for preprocedural cardiovascular examination: Secondary | ICD-10-CM | POA: Insufficient documentation

## 2012-09-17 DIAGNOSIS — Z01812 Encounter for preprocedural laboratory examination: Secondary | ICD-10-CM | POA: Insufficient documentation

## 2012-09-17 DIAGNOSIS — G8918 Other acute postprocedural pain: Secondary | ICD-10-CM

## 2012-09-17 DIAGNOSIS — M719 Bursopathy, unspecified: Principal | ICD-10-CM | POA: Insufficient documentation

## 2012-09-17 DIAGNOSIS — M8540 Solitary bone cyst, unspecified site: Secondary | ICD-10-CM | POA: Insufficient documentation

## 2012-09-17 HISTORY — PX: SHOULDER ARTHROSCOPY: SHX128

## 2012-09-17 LAB — GLUCOSE, CAPILLARY: Glucose-Capillary: 89 mg/dL (ref 70–99)

## 2012-09-17 SURGERY — ARTHROSCOPY, SHOULDER
Anesthesia: General | Site: Shoulder | Laterality: Left | Wound class: Clean

## 2012-09-17 MED ORDER — SIMVASTATIN 5 MG PO TABS
5.0000 mg | ORAL_TABLET | Freq: Every day | ORAL | Status: DC
  Administered 2012-09-17: 5 mg via ORAL
  Filled 2012-09-17 (×2): qty 1

## 2012-09-17 MED ORDER — ONDANSETRON HCL 4 MG/2ML IJ SOLN: 4.0000 mg | Freq: Four times a day (QID) | INTRAMUSCULAR | Status: DC | PRN

## 2012-09-17 MED ORDER — DEXAMETHASONE SODIUM PHOSPHATE 4 MG/ML IJ SOLN
INTRAMUSCULAR | Status: DC | PRN
  Administered 2012-09-17: 8 mg via INTRAVENOUS

## 2012-09-17 MED ORDER — BUPIVACAINE-EPINEPHRINE PF 0.25-1:200000 % IJ SOLN
INTRAMUSCULAR | Status: AC
  Filled 2012-09-17: qty 30

## 2012-09-17 MED ORDER — EPHEDRINE SULFATE 50 MG/ML IJ SOLN
INTRAMUSCULAR | Status: DC | PRN
  Administered 2012-09-17: 15 mg via INTRAVENOUS
  Administered 2012-09-17: 10 mg via INTRAVENOUS

## 2012-09-17 MED ORDER — EPINEPHRINE HCL 1 MG/ML IJ SOLN
INTRAMUSCULAR | Status: DC | PRN
  Administered 2012-09-17: 2 mL

## 2012-09-17 MED ORDER — DIPHENHYDRAMINE HCL 12.5 MG/5ML PO ELIX: 12.5000 mg | ORAL_SOLUTION | Freq: Four times a day (QID) | ORAL | Status: DC | PRN

## 2012-09-17 MED ORDER — METHOCARBAMOL 500 MG PO TABS
500.0000 mg | ORAL_TABLET | Freq: Four times a day (QID) | ORAL | Status: DC | PRN
  Filled 2012-09-17: qty 1

## 2012-09-17 MED ORDER — DIPHENHYDRAMINE HCL 50 MG/ML IJ SOLN: 12.5000 mg | Freq: Four times a day (QID) | INTRAMUSCULAR | Status: DC | PRN

## 2012-09-17 MED ORDER — LIDOCAINE HCL (CARDIAC) 20 MG/ML IV SOLN
INTRAVENOUS | Status: DC | PRN
  Administered 2012-09-17: 60 mg via INTRAVENOUS

## 2012-09-17 MED ORDER — DEXTROSE-NACL 5-0.45 % IV SOLN
INTRAVENOUS | Status: DC
  Administered 2012-09-17 – 2012-09-18 (×2): via INTRAVENOUS

## 2012-09-17 MED ORDER — HYDROMORPHONE HCL PF 1 MG/ML IJ SOLN
INTRAMUSCULAR | Status: AC
  Filled 2012-09-17: qty 1

## 2012-09-17 MED ORDER — CEFAZOLIN SODIUM 1-5 GM-% IV SOLN
1.0000 g | Freq: Four times a day (QID) | INTRAVENOUS | Status: AC
  Administered 2012-09-17 – 2012-09-18 (×3): 1 g via INTRAVENOUS
  Filled 2012-09-17 (×3): qty 50

## 2012-09-17 MED ORDER — AMLODIPINE BESYLATE 10 MG PO TABS
10.0000 mg | ORAL_TABLET | Freq: Every day | ORAL | Status: DC
  Administered 2012-09-17 – 2012-09-18 (×2): 10 mg via ORAL
  Filled 2012-09-17 (×2): qty 1

## 2012-09-17 MED ORDER — BUPIVACAINE-EPINEPHRINE 0.25% -1:200000 IJ SOLN
INTRAMUSCULAR | Status: DC | PRN
  Administered 2012-09-17: 15 mL

## 2012-09-17 MED ORDER — METOCLOPRAMIDE HCL 10 MG PO TABS: 5.0000 mg | ORAL_TABLET | Freq: Three times a day (TID) | ORAL | Status: DC | PRN

## 2012-09-17 MED ORDER — OXYCODONE HCL 5 MG/5ML PO SOLN: 5.0000 mg | Freq: Once | ORAL | Status: DC | PRN

## 2012-09-17 MED ORDER — NALOXONE HCL 0.4 MG/ML IJ SOLN: 0.4000 mg | INTRAMUSCULAR | Status: DC | PRN

## 2012-09-17 MED ORDER — SODIUM CHLORIDE 0.9 % IR SOLN
Status: DC | PRN
  Administered 2012-09-17: 6000 mL

## 2012-09-17 MED ORDER — HYDROCHLOROTHIAZIDE 25 MG PO TABS
25.0000 mg | ORAL_TABLET | Freq: Every day | ORAL | Status: DC
  Administered 2012-09-17 – 2012-09-18 (×2): 25 mg via ORAL
  Filled 2012-09-17 (×2): qty 1

## 2012-09-17 MED ORDER — FENTANYL CITRATE 0.05 MG/ML IJ SOLN
INTRAMUSCULAR | Status: DC | PRN
  Administered 2012-09-17: 100 ug via INTRAVENOUS
  Administered 2012-09-17 (×2): 50 ug via INTRAVENOUS

## 2012-09-17 MED ORDER — SODIUM CHLORIDE 0.9 % IJ SOLN: 9.0000 mL | INTRAMUSCULAR | Status: DC | PRN

## 2012-09-17 MED ORDER — CHLORHEXIDINE GLUCONATE 4 % EX LIQD: 60.0000 mL | Freq: Once | CUTANEOUS | Status: DC

## 2012-09-17 MED ORDER — ENALAPRIL-HYDROCHLOROTHIAZIDE 5-12.5 MG PO TABS: 2.0000 | ORAL_TABLET | Freq: Every day | ORAL | Status: DC

## 2012-09-17 MED ORDER — ROCURONIUM BROMIDE 100 MG/10ML IV SOLN
INTRAVENOUS | Status: DC | PRN
  Administered 2012-09-17: 50 mg via INTRAVENOUS

## 2012-09-17 MED ORDER — METOCLOPRAMIDE HCL 5 MG/ML IJ SOLN: 10.0000 mg | Freq: Once | INTRAMUSCULAR | Status: DC | PRN

## 2012-09-17 MED ORDER — PROPOFOL 10 MG/ML IV BOLUS
INTRAVENOUS | Status: DC | PRN
  Administered 2012-09-17: 180 mg via INTRAVENOUS

## 2012-09-17 MED ORDER — LABETALOL HCL 5 MG/ML IV SOLN
INTRAVENOUS | Status: DC | PRN
  Administered 2012-09-17 (×6): 2.5 mg via INTRAVENOUS

## 2012-09-17 MED ORDER — OXYCODONE-ACETAMINOPHEN 5-325 MG PO TABS
1.0000 | ORAL_TABLET | ORAL | Status: DC | PRN
  Administered 2012-09-18: 2 via ORAL
  Filled 2012-09-17: qty 2

## 2012-09-17 MED ORDER — NEOSTIGMINE METHYLSULFATE 1 MG/ML IJ SOLN
INTRAMUSCULAR | Status: DC | PRN
  Administered 2012-09-17: 4 mg via INTRAVENOUS

## 2012-09-17 MED ORDER — TRAZODONE HCL 100 MG PO TABS
100.0000 mg | ORAL_TABLET | Freq: Every day | ORAL | Status: DC
  Administered 2012-09-17: 100 mg via ORAL
  Filled 2012-09-17 (×2): qty 1

## 2012-09-17 MED ORDER — ONDANSETRON HCL 4 MG/2ML IJ SOLN
INTRAMUSCULAR | Status: DC | PRN
  Administered 2012-09-17: 4 mg via INTRAVENOUS

## 2012-09-17 MED ORDER — HYDROMORPHONE 0.3 MG/ML IV SOLN
INTRAVENOUS | Status: DC
  Administered 2012-09-18: 1.39 mg via INTRAVENOUS
  Filled 2012-09-17: qty 25

## 2012-09-17 MED ORDER — VITAMIN D3 25 MCG (1000 UT) PO CAPS: 2000.0000 [IU] | ORAL_CAPSULE | Freq: Every day | ORAL | Status: DC

## 2012-09-17 MED ORDER — LACTATED RINGERS IV SOLN
INTRAVENOUS | Status: DC
  Administered 2012-09-17: 09:00:00 via INTRAVENOUS

## 2012-09-17 MED ORDER — ACETAMINOPHEN 325 MG PO TABS: 650.0000 mg | ORAL_TABLET | Freq: Four times a day (QID) | ORAL | Status: DC | PRN

## 2012-09-17 MED ORDER — ACETAMINOPHEN 650 MG RE SUPP: 650.0000 mg | Freq: Four times a day (QID) | RECTAL | Status: DC | PRN

## 2012-09-17 MED ORDER — ONDANSETRON HCL 4 MG PO TABS: 4.0000 mg | ORAL_TABLET | Freq: Four times a day (QID) | ORAL | Status: DC | PRN

## 2012-09-17 MED ORDER — MENTHOL 3 MG MT LOZG: 1.0000 | LOZENGE | OROMUCOSAL | Status: DC | PRN

## 2012-09-17 MED ORDER — VITAMIN D3 25 MCG (1000 UNIT) PO TABS
2000.0000 [IU] | ORAL_TABLET | Freq: Every day | ORAL | Status: DC
  Administered 2012-09-17 – 2012-09-18 (×2): 2000 [IU] via ORAL
  Filled 2012-09-17 (×2): qty 2

## 2012-09-17 MED ORDER — ESTROGENS, CONJUGATED 0.625 MG/GM VA CREA
1.0000 | TOPICAL_CREAM | Freq: Every day | VAGINAL | Status: DC
  Administered 2012-09-18: 1 via VAGINAL
  Filled 2012-09-17: qty 42.5

## 2012-09-17 MED ORDER — PHENOL 1.4 % MT LIQD: 1.0000 | OROMUCOSAL | Status: DC | PRN

## 2012-09-17 MED ORDER — METOCLOPRAMIDE HCL 5 MG/ML IJ SOLN: 5.0000 mg | Freq: Three times a day (TID) | INTRAMUSCULAR | Status: DC | PRN

## 2012-09-17 MED ORDER — LACTATED RINGERS IV SOLN
INTRAVENOUS | Status: DC | PRN
  Administered 2012-09-17 (×2): via INTRAVENOUS

## 2012-09-17 MED ORDER — METOCLOPRAMIDE HCL 5 MG/ML IJ SOLN
INTRAMUSCULAR | Status: DC | PRN
  Administered 2012-09-17: 10 mg via INTRAVENOUS

## 2012-09-17 MED ORDER — ENALAPRIL MALEATE 10 MG PO TABS
10.0000 mg | ORAL_TABLET | Freq: Every day | ORAL | Status: DC
  Administered 2012-09-17 – 2012-09-18 (×2): 10 mg via ORAL
  Filled 2012-09-17 (×2): qty 1

## 2012-09-17 MED ORDER — HYDROMORPHONE 0.3 MG/ML IV SOLN
INTRAVENOUS | Status: AC
  Administered 2012-09-17: 12:00:00
  Filled 2012-09-17: qty 25

## 2012-09-17 MED ORDER — ALPRAZOLAM 0.5 MG PO TABS
1.0000 mg | ORAL_TABLET | Freq: Three times a day (TID) | ORAL | Status: DC | PRN
  Administered 2012-09-17 – 2012-09-18 (×2): 1 mg via ORAL
  Filled 2012-09-17 (×2): qty 2

## 2012-09-17 MED ORDER — OXYCODONE HCL 5 MG PO TABS: 5.0000 mg | ORAL_TABLET | Freq: Once | ORAL | Status: DC | PRN

## 2012-09-17 MED ORDER — ESTROGENS, CONJUGATED 0.625 MG/GM VA CREA: 0.5000 g | TOPICAL_CREAM | Freq: Every day | VAGINAL | Status: DC

## 2012-09-17 MED ORDER — GLYCOPYRROLATE 0.2 MG/ML IJ SOLN
INTRAMUSCULAR | Status: DC | PRN
  Administered 2012-09-17: .8 mg via INTRAVENOUS

## 2012-09-17 MED ORDER — METHOCARBAMOL 100 MG/ML IJ SOLN
500.0000 mg | Freq: Four times a day (QID) | INTRAVENOUS | Status: DC | PRN
  Administered 2012-09-17: 500 mg via INTRAVENOUS
  Filled 2012-09-17: qty 5

## 2012-09-17 MED ORDER — EPINEPHRINE HCL 1 MG/ML IJ SOLN
INTRAMUSCULAR | Status: AC
  Filled 2012-09-17: qty 2

## 2012-09-17 MED ORDER — HYDROMORPHONE HCL PF 1 MG/ML IJ SOLN
0.2500 mg | INTRAMUSCULAR | Status: DC | PRN
Start: 2012-09-17 — End: 2012-09-17

## 2012-09-17 SURGICAL SUPPLY — 39 items
BLADE CUTTER GATOR 3.5 (BLADE) IMPLANT
BLADE GREAT WHITE 4.2 (BLADE) ×2 IMPLANT
BLADE SURG 11 STRL SS (BLADE) ×2 IMPLANT
BUR OVAL 4.0 (BURR) IMPLANT
CLOTH BEACON ORANGE TIMEOUT ST (SAFETY) ×2 IMPLANT
COVER SURGICAL LIGHT HANDLE (MISCELLANEOUS) ×2 IMPLANT
DRAPE STERI 35X30 U-POUCH (DRAPES) ×2 IMPLANT
DRAPE U-SHAPE 47X51 STRL (DRAPES) ×2 IMPLANT
DRSG EMULSION OIL 3X3 NADH (GAUZE/BANDAGES/DRESSINGS) ×2 IMPLANT
DRSG PAD ABDOMINAL 8X10 ST (GAUZE/BANDAGES/DRESSINGS) ×2 IMPLANT
DURAPREP 26ML APPLICATOR (WOUND CARE) ×2 IMPLANT
ELECT MENISCUS 165MM 90D (ELECTRODE) IMPLANT
FILTER STRAW FLUID ASPIR (MISCELLANEOUS) ×2 IMPLANT
GLOVE SS PI 9.0 STRL (GLOVE) ×2 IMPLANT
GOWN PREVENTION PLUS XLARGE (GOWN DISPOSABLE) ×2 IMPLANT
GOWN STRL NON-REIN LRG LVL3 (GOWN DISPOSABLE) ×4 IMPLANT
KIT BASIN OR (CUSTOM PROCEDURE TRAY) ×2 IMPLANT
KIT ROOM TURNOVER OR (KITS) ×2 IMPLANT
MANIFOLD NEPTUNE II (INSTRUMENTS) ×2 IMPLANT
NDL HYPO 21X1.5 SAFETY (NEEDLE) ×1 IMPLANT
NEEDLE HYPO 21X1.5 SAFETY (NEEDLE) ×2 IMPLANT
NEEDLE HYPO 25GX1X1/2 BEV (NEEDLE) ×2 IMPLANT
NS IRRIG 1000ML POUR BTL (IV SOLUTION) ×2 IMPLANT
PACK SHOULDER (CUSTOM PROCEDURE TRAY) ×2 IMPLANT
PAD ARMBOARD 7.5X6 YLW CONV (MISCELLANEOUS) ×4 IMPLANT
SET ARTHROSCOPY TUBING (MISCELLANEOUS) ×2
SET ARTHROSCOPY TUBING LN (MISCELLANEOUS) ×1 IMPLANT
SPONGE GAUZE 4X4 12PLY (GAUZE/BANDAGES/DRESSINGS) ×2 IMPLANT
SPONGE LAP 4X18 X RAY DECT (DISPOSABLE) IMPLANT
SUT ETHIBOND 2 OS 4 DA (SUTURE) IMPLANT
SUT ETHILON 4 0 PS 2 18 (SUTURE) ×2 IMPLANT
SUT PROLENE 3 0 PS 2 (SUTURE) IMPLANT
SUT VIC AB 0 CTB1 27 (SUTURE) IMPLANT
SUT VIC AB 2-0 FS1 27 (SUTURE) IMPLANT
SYR 3ML LL SCALE MARK (SYRINGE) ×4 IMPLANT
SYR CONTROL 10ML LL (SYRINGE) ×2 IMPLANT
TOWEL OR 17X24 6PK STRL BLUE (TOWEL DISPOSABLE) ×2 IMPLANT
TOWEL OR 17X26 10 PK STRL BLUE (TOWEL DISPOSABLE) ×2 IMPLANT
WATER STERILE IRR 1000ML POUR (IV SOLUTION) ×2 IMPLANT

## 2012-09-17 NOTE — H&P (Signed)
[name], REYNOLDS NO.:  [id number]  MEDICAL RECORD NO.:  [id number]  LOCATION:  5N31C                        FACILITY:  [facility]  PHYSICIAN:  Myrtie Neither, MD      DATE OF BIRTH:  08/06/1933  DATE OF ADMISSION:  09/17/2012 DATE OF DISCHARGE:                             HISTORY & PHYSICAL   HISTORY OF PRESENT ILLNESS:  This is a 78 year old female being treated for left shoulder pain over the past 6 months or more with progressive worsening of pain, weakness, and loss of function in the left shoulder. The patient had previous arthroscopic acromioplasty and synovectomy 3 years ago, but presently has been having more difficulty with pain.  The patient is being given therapeutic injections, use of Decadron, steroid orally and moist heat with only minimal response.  PAST MEDICAL HISTORY:  Hyperlipidemia, hypertension, degenerative joint disease, history of shortness of breath, sleep apnea, history of anxiety, depression, and diabetes mellitus, chronic headaches, chronic anemia, history of hepatitis.  SURGICAL HISTORY:  Appendectomy, cholecystectomy, abdominal hysterectomy, tonsillectomy, total knee revision in 2011, rotator cuff repair in 2010, tonsillectomy, and left total joint replacement as well as right total joint replacement.  ALLERGIES:  METFORMIN.  FAMILY HISTORY:  High blood pressure and diabetes mellitus.  SOCIAL HISTORY:  Negative for use of tobacco, alcohol, or illegal drugs.  REVIEW OF SYSTEMS:  Sleep apnea.  No cardiac, no urinary or bowel symptoms.  Some episodic shortness of breath.  MEDICATION: 1. Xanax 1 mg. 2. Norvasc 10 mg daily. 3. Vitamin D3 2000 units daily. 4. Premarin 0.5 mg daily. 5. Vicodin 5 mg q.6 hours p.r.n. 6. Percocet 5 mg q.8 hours p.r.n. 7. Enalapril/hydrochlorothiazide 5/12.5 mg daily. 8. Pravastatin 80 mg daily. 9. Trazodone 100 mg at bedtime.  PHYSICAL EXAMINATION:  VITAL SIGNS:  Temperature 97.6, pulse  55, respirations 16, blood pressure 173/76, height 5 feet 2 inches, weight 97.07 kg.  O2 saturation 94%. HEAD:  Normocephalic. EYES:  Conjunctivae clear. NECK:  Supple. CHEST:  Clear. CARDIAC:  S1, S2 regular. EXTREMITIES:  Left shoulder tender anterolaterally with increased pain on abduction at about 90 degrees with increased weakness on resisted abduction as well as on rotation. NEUROLOGIC:  Neurovascular status is intact.  X-ray does reveal some degenerative joint changes of AC joint and glenohumeral joint with narrowing of the subacromial space.  IMPRESSION:  Rotator cuff tendinopathy.  MRI revealed degenerative joint changes, some muscle wasting in the rotator cuff, degenerative joint changes of the acromioclavicular joint, type 3 acromion with narrowed subacromial space.  There is evidence of degenerative joint changes involving the humeral head.  Osteophyte within the subacromial space and new subacromial as well as subdeltoid bursitis.  PLAN:  Arthroscopy, synovectomy, and possible cuff repair and decompression, left shoulder.     Myrtie Neither, MD     AC/MEDQ  D:  09/17/2012  T:  09/17/2012  Job:  [id number]

## 2012-09-17 NOTE — Anesthesia Procedure Notes (Signed)
Procedure Name: Intubation Date/Time: 09/17/2012 10:10 AM Performed by: Rogelia Boga Pre-anesthesia Checklist: Patient identified, Emergency Drugs available, Suction available, Patient being monitored and Timeout performed Patient Re-evaluated:Patient Re-evaluated prior to inductionOxygen Delivery Method: Circle system utilized Preoxygenation: Pre-oxygenation with 100% oxygen Intubation Type: IV induction Ventilation: Mask ventilation without difficulty and Oral airway inserted - appropriate to patient size Laryngoscope Size: Mac and 4 Grade View: Grade I Tube type: Oral Tube size: 7.5 mm Number of attempts: 1 Airway Equipment and Method: Stylet Placement Confirmation: ETT inserted through vocal cords under direct vision,  positive ETCO2 and breath sounds checked- equal and bilateral Secured at: 21 cm Tube secured with: Tape Dental Injury: Teeth and Oropharynx as per pre-operative assessment

## 2012-09-17 NOTE — Brief Op Note (Signed)
09/17/2012  11:33 AM  PATIENT:  [name]  78 y.o. female  PRE-OPERATIVE DIAGNOSIS:  impingement syndrome left shoulder  POST-OPERATIVE DIAGNOSIS:  impingement syndrome left shoulder  PROCEDURE:  Procedure(s) (LRB) with comments: ARTHROSCOPY SHOULDER (Left)  SURGEON:  Surgeon(s) and Role:    * Kennieth Rad, MD - Primary  PHYSICIAN ASSISTANT:   ASSISTANTS: none   ANESTHESIA:   general  EBL:     BLOOD ADMINISTERED:none  DRAINS: none   LOCAL MEDICATIONS USED:  MARCAINE     SPECIMEN:  No Specimen  DISPOSITION OF SPECIMEN:  N/A  COUNTS:  YES  TOURNIQUET:  * No tourniquets in log *  DICTATION: .Other Dictation: Dictation Number REPORT #[id number]  PLAN OF CARE: Admit for overnight observation  PATIENT DISPOSITION:  PACU - hemodynamically stable.   Delay start of Pharmacological VTE agent (>24hrs) due to surgical blood loss or risk of bleeding: not applicable

## 2012-09-17 NOTE — Transfer of Care (Signed)
Immediate Anesthesia Transfer of Care Note  Patient: [name]  Procedure(s) Performed: Procedure(s) (LRB) with comments: ARTHROSCOPY SHOULDER (Left)  Patient Location: PACU  Anesthesia Type: General  Level of Consciousness: awake, alert , oriented and patient cooperative  Airway & Oxygen Therapy: Patient Spontanous Breathing and Patient connected to nasal cannula oxygen  Post-op Assessment: Report given to PACU RN, Post -op Vital signs reviewed and stable and Patient moving all extremities  Post vital signs: Reviewed and stable  Complications: No apparent anesthesia complications

## 2012-09-17 NOTE — H&P (Signed)
[name] is an 78 y.o. female.   Chief Complaint: painful left shoulder HPI: THIS IS A 79Y/O FEMALE TREATED FOR PROGRESSIVE PAIN AND LOSS OF FUNCTION OVER THE PASS 6 MONTHS Called patient. Advised patient of provider's approval for requested procedure, as well as any comments/instructions from provider.   Provided patient w/ verbal instructions concerning pre-, intra- and post-procedure preparation and instructions.  Patient verbalized understanding of the above.   Patient has also been mailed a letter containing the following instructions :SCHEDULED FOR SURGERY ON 09/17/12.   HAS HAD THERAPUETIC INJECTIONS, USE OF DECADRON ,AND MOIST HEAT WITH ONLY TEMPORARY RELIEF.  Past Medical History  Diagnosis Date  . Hyperlipidemia   . Hypertension   . Osteoarthritis   . Wears dentures   . Bruises easily   . Diarrhea   . Abdominal pain   . Wears glasses   . Shortness of breath     WITH EXERTION USES 2 LO2 BEDTIME  . Oxygen desaturation during sleep     USES 2 LITERS BEDTIME   VIA CPAP 06/2012 [facility]   . Anxiety   . Depression   . Sleep apnea     CPAP WITH O2 2 LITERS 2013 ([facility])  . Diabetes mellitus     type II  DIET CONTROLLED  . Chronic headaches     HISTORY OF   . Anemia     NOS  . Hepatitis     AGE 30S    Past Surgical History  Procedure Date  . Appendectomy   . Cholecystectomy   . Abdominal hysterectomy   . Tonsillectomy   . Revision total knee arthroplasty 2011  . Rotator cuff repair 2010  . Tonsillectomy   . Joint replacement     LEFT KNEE   . Knee arthroscopy     RIGHT    Family History  Problem Relation Age of Onset  . Alcohol abuse Other   . Hypertension Other   . Kidney disease Other   . Mental illness Other   . Cancer Father     prostate cancer  . Heart disease Sister   . Heart disease Brother   . Emphysema Brother    Social History:  reports that she has never smoked. She has never used smokeless tobacco. She reports that she does not  drink alcohol or use illicit drugs.  Allergies:  Allergies  Allergen Reactions  . Metformin Diarrhea    REACTION: diarrhea IBS    Medications Prior to Admission  Medication Sig Dispense Refill  . ALPRAZolam (XANAX) 1 MG tablet Take 1 mg by mouth 3 (three) times daily as needed. For anxiety/sleep      . amLODipine (NORVASC) 10 MG tablet Take 10 mg by mouth daily.      . Cholecalciferol (VITAMIN D3) 1000 UNITS CAPS Take 2,000 Units by mouth daily.       Marland Kitchen conjugated estrogens (PREMARIN) vaginal cream Place 0.5 g vaginally daily.      . Enalapril-Hydrochlorothiazide 5-12.5 MG per tablet Take 2 tablets by mouth daily.      Marland Kitchen HYDROcodone-acetaminophen (VICODIN) 5-500 MG per tablet Take 1 tablet by mouth every 6 (six) hours as needed. Knee pain      . oxyCODONE-acetaminophen (PERCOCET/ROXICET) 5-325 MG per tablet Take 1 tablet by mouth every 8 (eight) hours as needed. For pain      . pravastatin (PRAVACHOL) 80 MG tablet Take 80 mg by mouth daily.      . traZODone (DESYREL) 100  MG tablet Take 100 mg by mouth at bedtime.        Results for orders placed during the hospital encounter of 09/17/12 (from the past 48 hour(s))  GLUCOSE, CAPILLARY     Status: Normal   Collection Time   09/17/12  7:37 AM      Component Value Range Comment   Glucose-Capillary 89  70 - 99 mg/dL    No results found.  Review of Systems  Constitutional: Negative.   HENT: Negative.   Eyes: Negative.   Respiratory: Negative.   Cardiovascular: Negative.   Gastrointestinal: Negative.   Genitourinary: Negative.   Musculoskeletal: Positive for joint pain.  Skin: Negative.   Neurological: Negative.   Endo/Heme/Allergies: Negative.   Psychiatric/Behavioral: Negative.     Blood pressure 173/76, pulse 55, temperature 97.6 F (36.4 C), temperature source Oral, resp. rate 16, SpO2 94.00%. Physical Exam LEFT SHOULDER TENDER ANTERIOR  ,AND LATERAL ASPECTS WITH INCREASE PAIN AND WEAKNESS ON RESISTIVE ABDUCTION  AND  ROTATION  Assessment/Plan ROTATOR CUFF TENOPATHY, IMPINGEMENT SYNDROME LEFT SHOULDER/ PLAN ARTHROSCOPY LEFT SHOULDER POSSIBLE CUFF REPAIR.  Kennieth Rad 09/17/2012, 9:02 AM

## 2012-09-17 NOTE — Preoperative (Signed)
Beta Blockers   Reason not to administer Beta Blockers:Not Applicable 

## 2012-09-17 NOTE — Anesthesia Preprocedure Evaluation (Signed)
Anesthesia Evaluation  Patient identified by MRN, date of birth, ID band Patient awake    Reviewed: Allergy & Precautions, H&P , NPO status , Patient's Chart, lab work & pertinent test results, reviewed documented beta blocker date and time   Airway Mallampati: II TM Distance: >3 FB Neck ROM: full    Dental   Pulmonary shortness of breath and with exertion, sleep apnea, Continuous Positive Airway Pressure Ventilation and Oxygen sleep apnea ,  breath sounds clear to auscultation        Cardiovascular hypertension, On Medications Rhythm:regular     Neuro/Psych  Headaches, PSYCHIATRIC DISORDERS    GI/Hepatic negative GI ROS, (+) Hepatitis -  Endo/Other  diabetesMorbid obesity  Renal/GU negative Renal ROS  negative genitourinary   Musculoskeletal   Abdominal   Peds  Hematology negative hematology ROS (+)   Anesthesia Other Findings See surgeon's H&P   Reproductive/Obstetrics negative OB ROS                           Anesthesia Physical Anesthesia Plan  ASA: III  Anesthesia Plan: General   Post-op Pain Management:    Induction: Intravenous  Airway Management Planned: Oral ETT  Additional Equipment:   Intra-op Plan:   Post-operative Plan: Extubation in OR  Informed Consent: I have reviewed the patients History and Physical, chart, labs and discussed the procedure including the risks, benefits and alternatives for the proposed anesthesia with the patient or authorized representative who has indicated his/her understanding and acceptance.   Dental Advisory Given  Plan Discussed with: CRNA and Surgeon  Anesthesia Plan Comments:         Anesthesia Quick Evaluation

## 2012-09-17 NOTE — Anesthesia Postprocedure Evaluation (Signed)
Anesthesia Post Note  Patient: [name]  Procedure(s) Performed: Procedure(s) (LRB): ARTHROSCOPY SHOULDER (Left)  Anesthesia type: General  Patient location: PACU  Post pain: Pain level controlled  Post assessment: Patient's Cardiovascular Status Stable  Last Vitals:  Filed Vitals:   09/17/12 1315  BP: 181/85  Pulse: 72  Temp:   Resp: 15    Post vital signs: Reviewed and stable  Level of consciousness: alert  Complications: No apparent anesthesia complications

## 2012-09-18 MED ORDER — OXYCODONE-ACETAMINOPHEN 5-325 MG PO TABS: 1.0000 | ORAL_TABLET | ORAL | Status: DC | PRN

## 2012-09-18 NOTE — Op Note (Signed)
NAMEJEWELL, HAUGHT NO.:  [id number]  MEDICAL RECORD NO.:  [id number]  LOCATION:  5N31C                        FACILITY:  [facility]  PHYSICIAN:  Myrtie Neither, MD      DATE OF BIRTH:  12-30-1932  DATE OF PROCEDURE:  09/17/2012 DATE OF DISCHARGE:                              OPERATIVE REPORT   PREOPERATIVE DIAGNOSIS:  Rotator cuff tendinopathy, impingement left shoulder.  POSTOPERATIVE DIAGNOSES: 1. Severe rotator cuff tendinopathy left shoulder. 2. Chronic synovitis, left shoulder. 3. Bone cyst of acromioclavicular joint.  PROCEDURE:  Arthroscopic complete synovectomy, excision of acromioclavicular joint osteophyte spur and left shoulder debridement.  ANESTHESIA:  General.  PROCEDURE:  The patient was taken to the operating room.  After given adequate preop medications, given general anesthesia and intubated, the patient was placed in a barber chair position.  The left shoulder was then prepped with DuraPrep and draped in sterile manner.  A posterior incision was made along the old surgical scar on the left shoulder going through the skin and subcutaneous tissue.  A Swisher rod was placed from posterior to anterior.  Anterior pole incision was then made and separated from the lateral incision was made for the arthroscope and inspection reveals tremendous hypertrophic subacromial bursal tissue with severe tendinopathy involving the rotator cuff without complete rotator cuff disruption, bone cyst osteophyte at the [facility] joint with the use of synovial shaver complete synovectomy was done and debridement of the rotator cuff.  Inspection of the cuff again was done with use of probe which revealed severe thinning but not complete disruption.  Bone cyst about the [facility] joint was resected with the synovial shaver and subacromial debridement as well as rotator cuff debridement was done. After adequate debridement, wound closure was then done with the use of 4-0 nylon.  14  mL 0.25% with epinephrine and Marcaine was injected into the joint.  Compressive dressing was applied.  Sling was applied.  The patient went to recovery room in stable and satisfactory condition.  The patient will be kept on 23 hour observation, to be discharged on Percocet 1-2 q.4 p.r.n. for pain.  Ice packs continuously and to return to the office in 1 week.  The patient being discharged in stable and satisfactory condition.     Myrtie Neither, MD    AC/MEDQ  D:  09/17/2012  T:  09/18/2012  Job:  [id number]

## 2012-09-18 NOTE — Discharge Summary (Signed)
NAMEJANINE, [name] NO.:  [id number]  MEDICAL RECORD NO.:  [id number]  LOCATION:  5N31C                        FACILITY:  [facility]  PHYSICIAN:  Myrtie Neither, MD      DATE OF BIRTH:  1933-07-20  DATE OF ADMISSION:  09/17/2012 DATE OF DISCHARGE:  09/18/2012                              DISCHARGE SUMMARY   ADMITTING DIAGNOSES:  Rotator cuff tendinopathy, impingement syndrome, left shoulder.  Also history of hypertension.  DISCHARGE DIAGNOSES:  Rotator cuff tendinopathy, AC joint with spurring, and chronic synovitis, left shoulder.  COMPLICATIONS:  None.  INFECTIONS:  None.  OPERATION:  Arthroscopic debridement, left shoulder; excision of osteophyte cyst left AC joint, synovectomy.  PERTINENT HISTORY:  This is a 78 year old female being treated in the office with progressive worsening of left shoulder pain and weakness on abduction and lifting objects above chest level.  Shoulder tender anterior and laterally with increased pain on resisted abduction and rotation.  MRI demonstrated rotator cuff tendinopathy of left shoulder.  HOSPITAL COURSE:  The patient underwent preop laboratory CBC, EKG, chest x-ray, CMET, UA.  EKG and labs were found to be stable enough for the patient to undergo surgery.  The patient underwent arthroscopic surgery of the left shoulder.  Tolerated the procedure quite well.  Pain was brought under control with use of PCA Dilaudid as well as oral Percocet 1-2 q.4 p.r.n. for pain.  The patient is stable enough to be discharged and is being discharged on Percocet 1-2 q.4 hours p.r.n. pain.  Continue ice packs to the left shoulder for next 48 hours, 3 times a day for 45 minutes each.  Continue with the sling.  Return to the office in 1 week. The patient is being discharged in stable and satisfactory condition.     Myrtie Neither, MD     AC/MEDQ  D:  09/18/2012  T:  09/18/2012  Job:  [id number]

## 2012-09-21 ENCOUNTER — Encounter (HOSPITAL_COMMUNITY): Payer: Self-pay | Admitting: Orthopedic Surgery

## 2012-10-07 ENCOUNTER — Ambulatory Visit (INDEPENDENT_AMBULATORY_CARE_PROVIDER_SITE_OTHER): Payer: Medicare HMO | Admitting: Pulmonary Disease

## 2012-10-07 ENCOUNTER — Encounter: Payer: Self-pay | Admitting: Pulmonary Disease

## 2012-10-07 VITALS — BP 132/80 | HR 74 | Temp 98.1°F | Ht 62.0 in | Wt 212.6 lb

## 2012-10-07 DIAGNOSIS — G4733 Obstructive sleep apnea (adult) (pediatric): Secondary | ICD-10-CM

## 2012-10-07 DIAGNOSIS — G4736 Sleep related hypoventilation in conditions classified elsewhere: Secondary | ICD-10-CM

## 2012-10-07 DIAGNOSIS — G4739 Other sleep apnea: Secondary | ICD-10-CM

## 2012-10-07 NOTE — Assessment & Plan Note (Signed)
She reports benefit from therapy and compliance with CPAP.  She is to continue CPAP 10 cm H2O.

## 2012-10-07 NOTE — Progress Notes (Signed)
Primary Care Physician:   Referring provider:    Chief Complaint  Patient presents with  . Follow-up    wears cpap everynight x 6-7 hrs a night. denies any problems w/ mask/machine. she can a difference since starting cpap and feels rested/better during the day. she wears 2 liters oxygen at night as well    History of Present Illness: [name] is a 78 y.o. female with OSA, OHS.  She has been using CPAP with 1 liter oxygen.  She has a full face mask, and his fits well.  She is sleeping better and feeling better.    She goes to bed at 9 pm.  She is using the bathroom less often during the night.  She wakes up at 6 am.  She does not need to take naps.  She uses trazodone to help her nerves, and this helps her sleep also.  Tests: PSG 06/15/12>>AHI 5.7, SpO2 low 83%. Added 1 liter oxygen. CPAP titration 07/29/12>>CPAP 10 cm H2O with 1 liter oxygen.  Past Medical History  Diagnosis Date  . Hyperlipidemia   . Hypertension   . Osteoarthritis   . Wears dentures   . Bruises easily   . Diarrhea   . Abdominal pain   . Wears glasses   . Shortness of breath     WITH EXERTION USES 2 LO2 BEDTIME  . Oxygen desaturation during sleep     USES 2 LITERS BEDTIME   VIA CPAP 06/2012 [facility]   . Anxiety   . Depression   . Sleep apnea     CPAP WITH O2 2 LITERS 2013 ([facility])  . Diabetes mellitus     type II  DIET CONTROLLED  . Chronic headaches     HISTORY OF   . Anemia     NOS  . Hepatitis     AGE 30S    Past Surgical History  Procedure Date  . Appendectomy   . Cholecystectomy   . Abdominal hysterectomy   . Tonsillectomy   . Revision total knee arthroplasty 2011  . Rotator cuff repair 2010  . Tonsillectomy   . Joint replacement     LEFT KNEE   . Knee arthroscopy     RIGHT  . Shoulder arthroscopy 09/17/2012    Procedure: ARTHROSCOPY SHOULDER;  Surgeon: Kennieth Rad, MD;  Location: [facility] OR;  Service: Orthopedics;  Laterality: Left;    Current Outpatient Prescriptions  on File Prior to Visit  Medication Sig Dispense Refill  . ALPRAZolam (XANAX) 1 MG tablet Take 1 mg by mouth 3 (three) times daily as needed. For anxiety/sleep      . amLODipine (NORVASC) 10 MG tablet Take 10 mg by mouth daily.      . Cholecalciferol (VITAMIN D3) 1000 UNITS CAPS Take 2,000 Units by mouth daily.       Marland Kitchen conjugated estrogens (PREMARIN) vaginal cream Place 0.5 g vaginally daily as needed.       . Enalapril-Hydrochlorothiazide 5-12.5 MG per tablet Take 2 tablets by mouth daily.      . pravastatin (PRAVACHOL) 80 MG tablet Take 80 mg by mouth daily.      . traZODone (DESYREL) 100 MG tablet Take 100 mg by mouth at bedtime.        Allergies  Allergen Reactions  . Metformin Diarrhea    REACTION: diarrhea IBS    Physical Exam: Filed Vitals:   10/07/12 1347 10/07/12 1349  BP:  132/80  Pulse:  74  Temp: 98.1  F (36.7 C)   TempSrc: Oral   Height: 5\' 2"  (1.575 m)   Weight: 212 lb 9.6 oz (96.435 kg)   SpO2:  92%    Wt Readings from Last 3 Encounters:  10/07/12 212 lb 9.6 oz (96.435 kg)  09/17/12 212 lb (96.163 kg)  09/17/12 212 lb (96.163 kg)    Body mass index is 38.89 kg/(m^2).   General - No distress ENT - No sinus tenderness, no oral exudate, no LAN Cardiac - s1s2 regular, no murmur Chest - No wheeze/rales/dullness, good air entry, normal respiratory excursion Back - No focal tenderness Abd - Soft, non-tender Ext - No edema Neuro - Normal strength Skin - No rashes Psych - Normal mood, and behavior  Assessment/Plan:  Coralyn Helling, MD [place] Pager:  [phone] 10/07/2012, 2:09 PM

## 2012-10-07 NOTE — Assessment & Plan Note (Signed)
She is to continue 1 liter oxygen with CPAP while asleep.

## 2012-10-07 NOTE — Patient Instructions (Signed)
Follow up in 1 year Call if help needed sooner 

## 2012-12-17 LAB — HM DIABETES EYE EXAM: HM Diabetic Eye Exam: NORMAL

## 2012-12-24 ENCOUNTER — Encounter: Payer: Self-pay | Admitting: Internal Medicine

## 2012-12-24 ENCOUNTER — Other Ambulatory Visit (INDEPENDENT_AMBULATORY_CARE_PROVIDER_SITE_OTHER): Payer: Medicare HMO

## 2012-12-24 ENCOUNTER — Ambulatory Visit (INDEPENDENT_AMBULATORY_CARE_PROVIDER_SITE_OTHER): Payer: Medicare HMO | Admitting: Internal Medicine

## 2012-12-24 VITALS — BP 122/74 | HR 81 | Temp 97.5°F | Resp 16 | Wt 212.0 lb

## 2012-12-24 DIAGNOSIS — E119 Type 2 diabetes mellitus without complications: Secondary | ICD-10-CM

## 2012-12-24 DIAGNOSIS — K222 Esophageal obstruction: Secondary | ICD-10-CM | POA: Insufficient documentation

## 2012-12-24 DIAGNOSIS — E785 Hyperlipidemia, unspecified: Secondary | ICD-10-CM

## 2012-12-24 DIAGNOSIS — K219 Gastro-esophageal reflux disease without esophagitis: Secondary | ICD-10-CM

## 2012-12-24 DIAGNOSIS — M1990 Unspecified osteoarthritis, unspecified site: Secondary | ICD-10-CM

## 2012-12-24 DIAGNOSIS — I10 Essential (primary) hypertension: Secondary | ICD-10-CM

## 2012-12-24 LAB — URINALYSIS, ROUTINE W REFLEX MICROSCOPIC
Hgb urine dipstick: NEGATIVE
Ketones, ur: NEGATIVE
Total Protein, Urine: NEGATIVE
Urine Glucose: NEGATIVE
pH: 5.5 (ref 5.0–8.0)

## 2012-12-24 LAB — COMPREHENSIVE METABOLIC PANEL
AST: 13 U/L (ref 0–37)
Albumin: 3.5 g/dL (ref 3.5–5.2)
Alkaline Phosphatase: 56 U/L (ref 39–117)
BUN: 16 mg/dL (ref 6–23)
Glucose, Bld: 100 mg/dL — ABNORMAL HIGH (ref 70–99)
Potassium: 4 mEq/L (ref 3.5–5.1)
Sodium: 140 mEq/L (ref 135–145)
Total Bilirubin: 1.1 mg/dL (ref 0.3–1.2)

## 2012-12-24 LAB — TSH: TSH: 3.5 u[IU]/mL (ref 0.35–5.50)

## 2012-12-24 LAB — CBC WITH DIFFERENTIAL/PLATELET
Basophils Relative: 0.6 % (ref 0.0–3.0)
Eosinophils Absolute: 0.2 10*3/uL (ref 0.0–0.7)
HCT: 37.7 % (ref 36.0–46.0)
Lymphs Abs: 2.5 10*3/uL (ref 0.7–4.0)
MCHC: 33.1 g/dL (ref 30.0–36.0)
MCV: 91.3 fl (ref 78.0–100.0)
Monocytes Absolute: 0.4 10*3/uL (ref 0.1–1.0)
Neutrophils Relative %: 47.6 % (ref 43.0–77.0)
Platelets: 142 10*3/uL — ABNORMAL LOW (ref 150.0–400.0)

## 2012-12-24 LAB — HM DIABETES FOOT EXAM: HM Diabetic Foot Exam: NORMAL

## 2012-12-24 LAB — LIPID PANEL
HDL: 67.6 mg/dL (ref >39.00)
Triglycerides: 102 mg/dL (ref 0.0–149.0)
VLDL: 20.4 mg/dL (ref 0.0–40.0)

## 2012-12-24 LAB — HEMOGLOBIN A1C: Hgb A1c MFr Bld: 5.5 % (ref 4.6–6.5)

## 2012-12-24 MED ORDER — HYDROCODONE-ACETAMINOPHEN 10-325 MG PO TABS: 1.0000 | ORAL_TABLET | Freq: Three times a day (TID) | ORAL | Status: DC | PRN

## 2012-12-24 MED ORDER — DEXLANSOPRAZOLE 60 MG PO CPDR: 60.0000 mg | DELAYED_RELEASE_CAPSULE | Freq: Every day | ORAL | Status: DC

## 2012-12-24 NOTE — Assessment & Plan Note (Signed)
Her BP is well controlled I will check her lytes and renal function 

## 2012-12-24 NOTE — Patient Instructions (Signed)

## 2012-12-24 NOTE — Progress Notes (Signed)
Subjective:    Patient ID: [name], female    DOB: Dec 29, 1932, 79 y.o.   MRN: [id number]  Gastrophageal Reflux She complains of belching, heartburn and water brash. She reports no abdominal pain, no chest pain, no choking, no coughing, no dysphagia, no early satiety, no globus sensation, no hoarse voice, no nausea, no sore throat, no stridor, no tooth decay or no wheezing. This is a chronic problem. The current episode started more than 1 month ago. The problem occurs occasionally. The problem has been unchanged. The heartburn duration is several minutes. The heartburn is located in the substernum. The heartburn is of mild intensity. The heartburn does not wake her from sleep. The heartburn does not limit her activity. The heartburn doesn't change with position. Pertinent negatives include no anemia, fatigue, melena, muscle weakness, orthopnea or weight loss. Risk factors include NSAIDs. She has tried nothing for the symptoms. Past procedures include an EGD.      Review of Systems  Constitutional: Negative for fever, chills, weight loss, diaphoresis, activity change, appetite change, fatigue and unexpected weight change.  HENT: Negative.  Negative for sore throat and hoarse voice.   Eyes: Negative.   Respiratory: Negative for cough, choking, shortness of breath, wheezing and stridor.   Cardiovascular: Negative for chest pain.  Gastrointestinal: Positive for heartburn and constipation. Negative for dysphagia, nausea, vomiting, abdominal pain, diarrhea, blood in stool, melena, abdominal distention and rectal pain.  Genitourinary: Negative.   Musculoskeletal: Positive for arthralgias (knees, shoulders). Negative for myalgias, back pain, joint swelling, gait problem and muscle weakness.  Skin: Negative.   Neurological: Negative for dizziness, syncope, facial asymmetry, speech difficulty, weakness, light-headedness and numbness.  Hematological: Negative for adenopathy. Does not bruise/bleed  easily.  Psychiatric/Behavioral: Negative.        Objective:   Physical Exam  Vitals reviewed. Constitutional: She is oriented to person, place, and time. She appears well-developed and well-nourished. No distress.  HENT:  Head: Normocephalic and atraumatic.  Mouth/Throat: Oropharynx is clear and moist. No oropharyngeal exudate.  Eyes: Conjunctivae normal are normal. Right eye exhibits no discharge. Left eye exhibits no discharge. No scleral icterus.  Neck: Normal range of motion. Neck supple. No JVD present. No tracheal deviation present. No thyromegaly present.  Cardiovascular: Normal rate, regular rhythm, normal heart sounds and intact distal pulses.  Exam reveals no gallop and no friction rub.   No murmur heard. Pulmonary/Chest: Effort normal and breath sounds normal. No stridor. No respiratory distress. She has no wheezes. She has no rales. She exhibits no tenderness.  Abdominal: Soft. Bowel sounds are normal. She exhibits no distension and no mass. There is no tenderness. There is no rebound and no guarding.  Musculoskeletal: Normal range of motion. She exhibits no edema and no tenderness.  Lymphadenopathy:    She has no cervical adenopathy.  Neurological: She is oriented to person, place, and time.  Skin: Skin is warm and dry. No rash noted. She is not diaphoretic. No erythema. No pallor.  Psychiatric: She has a normal mood and affect. Her behavior is normal. Judgment and thought content normal.      Lab Results  Component Value Date   WBC 6.6 09/09/2012   HGB 12.7 09/09/2012   HCT 39.5 09/09/2012   PLT 146* 09/09/2012   GLUCOSE 94 09/09/2012   CHOL 197 12/19/2011   TRIG 117.0 12/19/2011   HDL 69.00 12/19/2011   LDLDIRECT 150.1 04/15/2011   LDLCALC 105* 12/19/2011   ALT 12 09/09/2012   AST 15 09/09/2012  NA 138 09/09/2012   K 4.1 09/09/2012   CL 103 09/09/2012   CREATININE 0.67 09/09/2012   BUN 16 09/09/2012   CO2 27 09/09/2012   TSH 2.95 04/16/2012   INR 2.29*  05/07/2010   HGBA1C 5.3 04/16/2012      Assessment & Plan:

## 2012-12-24 NOTE — Assessment & Plan Note (Signed)
Start dexilant I have asked her to stop taking nsaids

## 2012-12-24 NOTE — Assessment & Plan Note (Signed)
She will take vicodin for pain relief

## 2012-12-24 NOTE — Assessment & Plan Note (Signed)
FLP today 

## 2012-12-24 NOTE — Assessment & Plan Note (Signed)
Today I will check her A1C and will monitor her renal function 

## 2013-01-14 LAB — HM DIABETES EYE EXAM

## 2013-02-24 ENCOUNTER — Ambulatory Visit (INDEPENDENT_AMBULATORY_CARE_PROVIDER_SITE_OTHER): Payer: Medicare HMO | Admitting: Internal Medicine

## 2013-02-24 ENCOUNTER — Encounter: Payer: Self-pay | Admitting: Internal Medicine

## 2013-02-24 VITALS — BP 130/80 | HR 84 | Temp 97.4°F | Resp 16 | Wt 213.2 lb

## 2013-02-24 DIAGNOSIS — I10 Essential (primary) hypertension: Secondary | ICD-10-CM

## 2013-02-24 DIAGNOSIS — N3281 Overactive bladder: Secondary | ICD-10-CM | POA: Insufficient documentation

## 2013-02-24 DIAGNOSIS — K219 Gastro-esophageal reflux disease without esophagitis: Secondary | ICD-10-CM

## 2013-02-24 DIAGNOSIS — K222 Esophageal obstruction: Secondary | ICD-10-CM

## 2013-02-24 DIAGNOSIS — N318 Other neuromuscular dysfunction of bladder: Secondary | ICD-10-CM

## 2013-02-24 DIAGNOSIS — M1990 Unspecified osteoarthritis, unspecified site: Secondary | ICD-10-CM

## 2013-02-24 MED ORDER — TRAMADOL HCL 50 MG PO TABS: 50.0000 mg | ORAL_TABLET | Freq: Three times a day (TID) | ORAL | Status: DC | PRN

## 2013-02-24 MED ORDER — PANTOPRAZOLE SODIUM 40 MG PO TBEC: 40.0000 mg | DELAYED_RELEASE_TABLET | Freq: Every day | ORAL | Status: DC

## 2013-02-24 MED ORDER — MIRABEGRON ER 25 MG PO TB24: 25.0000 mg | ORAL_TABLET | Freq: Every day | ORAL | Status: DC

## 2013-02-24 MED ORDER — PROMETHAZINE HCL 12.5 MG PO TABS: 12.5000 mg | ORAL_TABLET | Freq: Four times a day (QID) | ORAL | Status: DC | PRN

## 2013-02-24 NOTE — Progress Notes (Signed)
Subjective:    Patient ID: [name], female    DOB: 30-Oct-1933, 79 y.o.   MRN: [id number]  Gastrophageal Reflux She complains of abdominal pain (gnawing in her epigastrium) and heartburn. She reports no belching, no chest pain, no choking, no coughing, no dysphagia, no early satiety, no globus sensation, no hoarse voice, no nausea, no sore throat, no stridor, no tooth decay, no water brash or no wheezing. This is a recurrent problem. The current episode started 1 to 4 weeks ago. The problem occurs occasionally. The problem has been unchanged. The heartburn is located in the abdomen. The heartburn does not wake her from sleep. The heartburn does not limit her activity. The heartburn doesn't change with position. Pertinent negatives include no anemia, fatigue, melena, muscle weakness, orthopnea or weight loss. Risk factors include NSAIDs. She has tried a PPI for the symptoms. The treatment provided significant relief. Past procedures include an EGD.      Review of Systems  Constitutional: Negative.  Negative for weight loss and fatigue.  HENT: Negative.  Negative for sore throat and hoarse voice.   Eyes: Negative.   Respiratory: Negative.  Negative for cough, choking and wheezing.   Cardiovascular: Negative.  Negative for chest pain.  Gastrointestinal: Positive for heartburn and abdominal pain (gnawing in her epigastrium). Negative for dysphagia, nausea, vomiting, diarrhea, constipation, melena, abdominal distention, anal bleeding and rectal pain.  Endocrine: Negative.   Genitourinary: Positive for urgency, frequency and enuresis. Negative for dysuria, hematuria, flank pain, decreased urine volume, vaginal bleeding, vaginal discharge, difficulty urinating, genital sores, vaginal pain, menstrual problem, pelvic pain and dyspareunia.  Musculoskeletal: Negative.  Negative for myalgias, back pain, joint swelling, gait problem and muscle weakness.  Skin: Negative.   Allergic/Immunologic: Negative.    Neurological: Negative for dizziness, weakness, light-headedness and numbness.  Hematological: Negative.  Negative for adenopathy. Does not bruise/bleed easily.  Psychiatric/Behavioral: Negative.        Objective:   Physical Exam  Vitals reviewed. Constitutional: She is oriented to person, place, and time. She appears well-developed and well-nourished.  Non-toxic appearance. She does not have a sickly appearance. She does not appear ill. No distress.  HENT:  Head: Normocephalic and atraumatic.  Mouth/Throat: Oropharynx is clear and moist. No oropharyngeal exudate.  Eyes: Conjunctivae are normal. Right eye exhibits no discharge. Left eye exhibits no discharge. No scleral icterus.  Neck: Normal range of motion. Neck supple. No JVD present. No tracheal deviation present. No thyromegaly present.  Cardiovascular: Normal rate, regular rhythm, normal heart sounds and intact distal pulses.  Exam reveals no gallop and no friction rub.   No murmur heard. Pulmonary/Chest: Effort normal and breath sounds normal. No stridor. No respiratory distress. She has no wheezes. She has no rales. She exhibits no tenderness.  Abdominal: Soft. Bowel sounds are normal. She exhibits no distension and no mass. There is no tenderness. There is no rebound and no guarding.  Musculoskeletal: Normal range of motion. She exhibits no edema and no tenderness.  Lymphadenopathy:    She has no cervical adenopathy.  Neurological: She is oriented to person, place, and time.  Skin: Skin is warm and dry. No rash noted. She is not diaphoretic. No erythema. No pallor.  Psychiatric: She has a normal mood and affect. Her behavior is normal. Judgment and thought content normal.      Lab Results  Component Value Date   WBC 6.2 12/24/2012   HGB 12.5 12/24/2012   HCT 37.7 12/24/2012   PLT 142.0* 12/24/2012  GLUCOSE 100* 12/24/2012   CHOL 173 12/24/2012   TRIG 102.0 12/24/2012   HDL 67.60 12/24/2012   LDLDIRECT 150.1 04/15/2011    LDLCALC 85 12/24/2012   ALT 14 12/24/2012   AST 13 12/24/2012   NA 140 12/24/2012   K 4.0 12/24/2012   CL 103 12/24/2012   CREATININE 0.7 12/24/2012   BUN 16 12/24/2012   CO2 32 12/24/2012   TSH 3.50 12/24/2012   INR 2.29* 05/07/2010   HGBA1C 5.5 12/24/2012      Assessment & Plan:

## 2013-02-24 NOTE — Patient Instructions (Signed)

## 2013-02-26 ENCOUNTER — Other Ambulatory Visit: Payer: Self-pay | Admitting: Internal Medicine

## 2013-02-26 NOTE — Assessment & Plan Note (Signed)
dexilant was too expensive so I changed to protonix I am concerned about PUD so I have asked her to see GI

## 2013-02-26 NOTE — Assessment & Plan Note (Signed)
Her BP is well controlled 

## 2013-02-26 NOTE — Assessment & Plan Note (Signed)
She will try myrbetriq

## 2013-02-26 NOTE — Assessment & Plan Note (Signed)
She will stop the nsaids I will control the pain with tramadol

## 2013-03-11 ENCOUNTER — Other Ambulatory Visit: Payer: Self-pay | Admitting: Orthopedic Surgery

## 2013-03-11 ENCOUNTER — Ambulatory Visit
Admission: RE | Admit: 2013-03-11 | Discharge: 2013-03-11 | Disposition: A | Discharge status: Home / Self Care | Payer: Medicare HMO | Source: Ambulatory Visit | Attending: Orthopedic Surgery | Admitting: Orthopedic Surgery

## 2013-03-11 DIAGNOSIS — M549 Dorsalgia, unspecified: Secondary | ICD-10-CM

## 2013-03-12 ENCOUNTER — Other Ambulatory Visit: Payer: Self-pay | Admitting: Internal Medicine

## 2013-03-12 NOTE — Telephone Encounter (Signed)
Rx phone into [employer].

## 2013-03-19 ENCOUNTER — Other Ambulatory Visit: Payer: Self-pay | Admitting: Orthopedic Surgery

## 2013-03-19 DIAGNOSIS — M545 Low back pain: Secondary | ICD-10-CM

## 2013-03-25 ENCOUNTER — Ambulatory Visit
Admission: RE | Admit: 2013-03-25 | Discharge: 2013-03-25 | Disposition: A | Discharge status: Home / Self Care | Payer: Medicare HMO | Source: Ambulatory Visit | Attending: Orthopedic Surgery | Admitting: Orthopedic Surgery

## 2013-03-25 DIAGNOSIS — M545 Low back pain, unspecified: Secondary | ICD-10-CM

## 2013-04-05 ENCOUNTER — Encounter (HOSPITAL_COMMUNITY): Payer: Self-pay | Admitting: *Deleted

## 2013-04-05 ENCOUNTER — Emergency Department (HOSPITAL_COMMUNITY)
Admission: EM | Admit: 2013-04-05 | Discharge: 2013-04-06 | Disposition: A | Discharge status: Home / Self Care | Payer: Medicare HMO | Attending: Emergency Medicine | Admitting: Emergency Medicine

## 2013-04-05 ENCOUNTER — Emergency Department (HOSPITAL_COMMUNITY): Payer: Medicare HMO

## 2013-04-05 ENCOUNTER — Emergency Department (INDEPENDENT_AMBULATORY_CARE_PROVIDER_SITE_OTHER)
Admission: EM | Admit: 2013-04-05 | Discharge: 2013-04-05 | Disposition: A | Discharge status: Other Acute Inpatient Hospital | Payer: Medicare HMO | Source: Home / Self Care | Attending: Emergency Medicine | Admitting: Emergency Medicine

## 2013-04-05 ENCOUNTER — Emergency Department (INDEPENDENT_AMBULATORY_CARE_PROVIDER_SITE_OTHER): Payer: Medicare HMO

## 2013-04-05 DIAGNOSIS — R0902 Hypoxemia: Secondary | ICD-10-CM | POA: Insufficient documentation

## 2013-04-05 DIAGNOSIS — Z98811 Dental restoration status: Secondary | ICD-10-CM | POA: Insufficient documentation

## 2013-04-05 DIAGNOSIS — R238 Other skin changes: Secondary | ICD-10-CM | POA: Insufficient documentation

## 2013-04-05 DIAGNOSIS — R110 Nausea: Secondary | ICD-10-CM | POA: Insufficient documentation

## 2013-04-05 DIAGNOSIS — M1990 Unspecified osteoarthritis, unspecified site: Secondary | ICD-10-CM | POA: Insufficient documentation

## 2013-04-05 DIAGNOSIS — F329 Major depressive disorder, single episode, unspecified: Secondary | ICD-10-CM | POA: Insufficient documentation

## 2013-04-05 DIAGNOSIS — R350 Frequency of micturition: Secondary | ICD-10-CM | POA: Insufficient documentation

## 2013-04-05 DIAGNOSIS — R319 Hematuria, unspecified: Secondary | ICD-10-CM

## 2013-04-05 DIAGNOSIS — E785 Hyperlipidemia, unspecified: Secondary | ICD-10-CM | POA: Insufficient documentation

## 2013-04-05 DIAGNOSIS — G4730 Sleep apnea, unspecified: Secondary | ICD-10-CM | POA: Insufficient documentation

## 2013-04-05 DIAGNOSIS — M549 Dorsalgia, unspecified: Secondary | ICD-10-CM

## 2013-04-05 DIAGNOSIS — Z888 Allergy status to other drugs, medicaments and biological substances status: Secondary | ICD-10-CM | POA: Insufficient documentation

## 2013-04-05 DIAGNOSIS — R109 Unspecified abdominal pain: Secondary | ICD-10-CM

## 2013-04-05 DIAGNOSIS — F411 Generalized anxiety disorder: Secondary | ICD-10-CM | POA: Insufficient documentation

## 2013-04-05 DIAGNOSIS — E119 Type 2 diabetes mellitus without complications: Secondary | ICD-10-CM | POA: Insufficient documentation

## 2013-04-05 DIAGNOSIS — Z789 Other specified health status: Secondary | ICD-10-CM | POA: Insufficient documentation

## 2013-04-05 DIAGNOSIS — F3289 Other specified depressive episodes: Secondary | ICD-10-CM | POA: Insufficient documentation

## 2013-04-05 DIAGNOSIS — D649 Anemia, unspecified: Secondary | ICD-10-CM | POA: Insufficient documentation

## 2013-04-05 DIAGNOSIS — I10 Essential (primary) hypertension: Secondary | ICD-10-CM | POA: Insufficient documentation

## 2013-04-05 DIAGNOSIS — Z79899 Other long term (current) drug therapy: Secondary | ICD-10-CM | POA: Insufficient documentation

## 2013-04-05 DIAGNOSIS — R197 Diarrhea, unspecified: Secondary | ICD-10-CM | POA: Insufficient documentation

## 2013-04-05 DIAGNOSIS — N390 Urinary tract infection, site not specified: Secondary | ICD-10-CM

## 2013-04-05 LAB — COMPREHENSIVE METABOLIC PANEL
ALT: 22 U/L (ref 0–35)
AST: 23 U/L (ref 0–37)
Albumin: 3.9 g/dL (ref 3.5–5.2)
Calcium: 9.8 mg/dL (ref 8.4–10.5)
Creatinine, Ser: 0.58 mg/dL (ref 0.50–1.10)
Sodium: 138 mEq/L (ref 135–145)

## 2013-04-05 LAB — CBC WITH DIFFERENTIAL/PLATELET
Basophils Absolute: 0 10*3/uL (ref 0.0–0.1)
Basophils Relative: 0 % (ref 0–1)
Eosinophils Relative: 0 % (ref 0–5)
HCT: 42.3 % (ref 36.0–46.0)
MCHC: 35 g/dL (ref 30.0–36.0)
MCV: 89.1 fL (ref 78.0–100.0)
Monocytes Absolute: 0.7 10*3/uL (ref 0.1–1.0)
Neutro Abs: 10.9 10*3/uL — ABNORMAL HIGH (ref 1.7–7.7)
Platelets: 189 10*3/uL (ref 150–400)
RDW: 12.4 % (ref 11.5–15.5)
WBC: 13.3 10*3/uL — ABNORMAL HIGH (ref 4.0–10.5)

## 2013-04-05 LAB — POCT URINALYSIS DIP (DEVICE)
Ketones, ur: NEGATIVE mg/dL
Protein, ur: 100 mg/dL — AB
Urobilinogen, UA: 0.2 mg/dL (ref 0.0–1.0)
pH: 6 (ref 5.0–8.0)

## 2013-04-05 MED ORDER — HYDROCODONE-ACETAMINOPHEN 5-325 MG PO TABS
2.0000 | ORAL_TABLET | Freq: Once | ORAL | Status: AC
  Administered 2013-04-05: 2 via ORAL

## 2013-04-05 MED ORDER — ONDANSETRON 4 MG PO TBDP
4.0000 mg | ORAL_TABLET | Freq: Once | ORAL | Status: AC
  Administered 2013-04-05: 4 mg via ORAL

## 2013-04-05 MED ORDER — ONDANSETRON 4 MG PO TBDP
ORAL_TABLET | ORAL | Status: AC
  Filled 2013-04-05: qty 1

## 2013-04-05 MED ORDER — HYDROCODONE-ACETAMINOPHEN 5-325 MG PO TABS
ORAL_TABLET | ORAL | Status: AC
  Filled 2013-04-05: qty 2

## 2013-04-05 NOTE — ED Notes (Signed)
Patient still unable to give urine sample;  Patient given cup of water

## 2013-04-05 NOTE — ED Notes (Addendum)
Pt sent from urgent care for a CT scan for her kindeys. Pt states that she was seen at urgent care and they thought of kindey stone or possible obstruction from kidney stone. Pt having right flank pain, with blood in urine. Pt had UA,  1 vicoden, and zofran at urgent care.

## 2013-04-05 NOTE — ED Notes (Signed)
Patient unable to provide urine specimen.  Patient given cup of water

## 2013-04-05 NOTE — ED Provider Notes (Signed)
History     CSN: [id number]  Arrival date & time 04/05/13  1651   First MD Initiated Contact with Patient 04/05/13 1729      Chief Complaint  Patient presents with  . Flank Pain    (Consider location/radiation/quality/duration/timing/severity/associated sxs/prior treatment) Patient is a 79 y.o. female presenting with flank pain. The history is provided by the patient. No language interpreter was used.  Flank Pain This is a new problem. The current episode started more than 1 week ago. The problem occurs constantly. The problem has been gradually worsening. Nothing aggravates the symptoms. Nothing relieves the symptoms. She has tried nothing for the symptoms.   Pt reports she has had flank pain for several weeks.  Pt saw Orthopaedist who was concerned pt could have a stone.   Past Medical History  Diagnosis Date  . Hyperlipidemia   . Hypertension   . Osteoarthritis   . Wears dentures   . Bruises easily   . Diarrhea   . Abdominal pain   . Wears glasses   . Shortness of breath     WITH EXERTION USES 2 LO2 BEDTIME  . Oxygen desaturation during sleep     USES 2 LITERS BEDTIME   VIA CPAP 06/2012 [facility]   . Anxiety   . Depression   . Sleep apnea     CPAP WITH O2 2 LITERS 2013 ([facility])  . Diabetes mellitus     type II  DIET CONTROLLED  . Chronic headaches     HISTORY OF   . Anemia     NOS  . Hepatitis     AGE 30S    Past Surgical History  Procedure Laterality Date  . Appendectomy    . Cholecystectomy    . Abdominal hysterectomy    . Tonsillectomy    . Revision total knee arthroplasty  2011  . Rotator cuff repair  2010  . Tonsillectomy    . Joint replacement      LEFT KNEE   . Knee arthroscopy      RIGHT  . Shoulder arthroscopy  09/17/2012    Procedure: ARTHROSCOPY SHOULDER;  Surgeon: Kennieth Rad, MD;  Location: [facility] OR;  Service: Orthopedics;  Laterality: Left;    Family History  Problem Relation Age of Onset  . Alcohol abuse Other   . Hypertension  Other   . Kidney disease Other   . Mental illness Other   . Cancer Father     prostate cancer  . Heart disease Sister   . Heart disease Brother   . Emphysema Brother     History  Substance Use Topics  . Smoking status: Never Smoker   . Smokeless tobacco: Never Used  . Alcohol Use: No    OB History   Grav Para Term Preterm Abortions TAB SAB Ect Mult Living                  Review of Systems  Genitourinary: Positive for flank pain.  All other systems reviewed and are negative.    Allergies  Metformin  Home Medications   Current Outpatient Rx  Name  Route  Sig  Dispense  Refill  . ALPRAZolam (XANAX) 1 MG tablet      take 1 tablet by mouth three times a day if needed for sleep   90 tablet   5   . amLODipine (NORVASC) 10 MG tablet   Oral   Take 10 mg by mouth daily.         Marland Kitchen  Cholecalciferol (VITAMIN D3) 1000 UNITS CAPS   Oral   Take 2,000 Units by mouth daily.          Marland Kitchen conjugated estrogens (PREMARIN) vaginal cream   Vaginal   Place 0.5 g vaginally daily as needed.          . Enalapril-Hydrochlorothiazide 5-12.5 MG per tablet   Oral   Take 2 tablets by mouth daily.         . Enalapril-Hydrochlorothiazide 5-12.5 MG per tablet      take 2 tablets by mouth once daily   180 tablet   1   . mirabegron ER (MYRBETRIQ) 25 MG TB24   Oral   Take 1 tablet (25 mg total) by mouth daily.   28 tablet   0   . pantoprazole (PROTONIX) 40 MG tablet   Oral   Take 1 tablet (40 mg total) by mouth daily.   90 tablet   3   . pravastatin (PRAVACHOL) 80 MG tablet   Oral   Take 80 mg by mouth daily.         . promethazine (PHENERGAN) 12.5 MG tablet   Oral   Take 1 tablet (12.5 mg total) by mouth every 6 (six) hours as needed for nausea.   30 tablet   2   . traMADol (ULTRAM) 50 MG tablet   Oral   Take 1 tablet (50 mg total) by mouth every 8 (eight) hours as needed for pain.   75 tablet   5   . traZODone (DESYREL) 100 MG tablet   Oral   Take 100  mg by mouth at bedtime.           BP 185/86  Pulse 72  Temp(Src) 98.6 F (37 C) (Oral)  SpO2 99%  Physical Exam  Nursing note and vitals reviewed. Constitutional: She appears well-developed and well-nourished.  HENT:  Head: Normocephalic.  Eyes: Pupils are equal, round, and reactive to light.  Neck: Normal range of motion.  Cardiovascular: Normal rate.   Pulmonary/Chest: Effort normal.  Abdominal: Soft.  Musculoskeletal: Normal range of motion.  Neurological: She is alert.  Skin: Skin is warm.  Psychiatric: She has a normal mood and affect.    ED Course  Procedures (including critical care time)  Labs Reviewed - No data to display No results found.   1. Hematuria   2. Flank pain       MDM  Pt uncomfortable,   KUB shows constipation,  ua shows hematuria,   I am concerned about stone.   Given discomfort I am going to send pt to ED for ct scan to evaluate her hematuria        Elson Areas, PA-C 04/05/13 1849  Lonia Skinner [place], PA-C 04/05/13 1849  Lonia Skinner [place], [place] 04/05/13 1851

## 2013-04-05 NOTE — ED Notes (Signed)
Pt  Reports     R  Flank  Pain         X  Several  Weeks      With  Some  Nausea  As  Well         Pt  Reports      She  Was  Seen  Recently  By  The  Orthopedist         5  Days  Ago  And  Was  Told  She  May  Have  A  Kidney  Problem

## 2013-04-05 NOTE — ED Notes (Signed)
CT called and told that MD signed off on CT scan with pt in waiting room. Aware of pt need for scan.

## 2013-04-05 NOTE — ED Provider Notes (Signed)
Medical screening examination/treatment/procedure(s) were performed by non-physician practitioner and as supervising physician I was immediately available for consultation/collaboration.  Raynald Blend, MD 04/05/13 [phone]

## 2013-04-06 LAB — URINALYSIS, ROUTINE W REFLEX MICROSCOPIC
Glucose, UA: NEGATIVE mg/dL
Specific Gravity, Urine: 1.009 (ref 1.005–1.030)
Urobilinogen, UA: 0.2 mg/dL (ref 0.0–1.0)
pH: 7 (ref 5.0–8.0)

## 2013-04-06 LAB — URINE MICROSCOPIC-ADD ON

## 2013-04-06 MED ORDER — CIPROFLOXACIN HCL 500 MG PO TABS: 500.0000 mg | ORAL_TABLET | Freq: Two times a day (BID) | ORAL | Status: DC

## 2013-04-06 MED ORDER — ONDANSETRON 4 MG PO TBDP
4.0000 mg | ORAL_TABLET | Freq: Once | ORAL | Status: AC
  Administered 2013-04-06: 4 mg via ORAL
  Filled 2013-04-06: qty 1

## 2013-04-06 MED ORDER — ONDANSETRON 4 MG PO TBDP: 4.0000 mg | ORAL_TABLET | Freq: Three times a day (TID) | ORAL | Status: DC | PRN

## 2013-04-06 MED ORDER — OXYCODONE-ACETAMINOPHEN 5-325 MG PO TABS
1.0000 | ORAL_TABLET | Freq: Once | ORAL | Status: AC
  Administered 2013-04-06: 1 via ORAL
  Filled 2013-04-06: qty 1

## 2013-04-06 MED ORDER — CIPROFLOXACIN HCL 500 MG PO TABS
500.0000 mg | ORAL_TABLET | Freq: Once | ORAL | Status: AC
  Administered 2013-04-06: 500 mg via ORAL
  Filled 2013-04-06: qty 1

## 2013-04-06 NOTE — ED Provider Notes (Signed)
Medical screening examination/treatment/procedure(s) were conducted as a shared visit with non-physician practitioner(s) and myself.  I personally evaluated the patient during the encounter.  Pt with right flank pain, reproducible with palpation.  Sent from urgent care due to hemoglobin in urine dip, but no stone seen on CT scan.  Possible inflammation/infection at lower colon, possible uti.  Will cover with cipro.  Pt updated on findings and plan, and is feeling better.  Noted to be htn, no end organ complaint, has good f/u with pcm  Olivia Mackie, MD 04/06/13 [phone]

## 2013-04-06 NOTE — ED Notes (Signed)
Pt went to urgent care today for evaluation of general malaise and "not feeling well".  Pt admits to N/V/D.  Urinalysis at Urgent Care found blood in pt urine, pt sent to ED for CT to evaluate kidney.  Pt complaining of pain in right flank.  Denies any changes or difficulty with urination.  Alert and oriented X4, will continue to monitor pt.

## 2013-04-06 NOTE — ED Provider Notes (Signed)
History     CSN: [id number]  Arrival date & time 04/05/13  1924   First MD Initiated Contact with Patient 04/05/13 2344      Chief Complaint  Patient presents with  . Flank Pain    (Consider location/radiation/quality/duration/timing/severity/associated sxs/prior treatment) HPI Comments: Patient presents with a chief complaint of right flank pain.  Pain has been present for the past week and is gradually worsening.  Pain worse with movement.  She reports that she saw her Orthopedist regarding this pain and was told that the pain was not back pain, but her kidneys.  She was seen by [facility] just prior to arrival and transferred to the ED to obtain a CT ab/pelvis to rule out Kidney Stones.  She has taken Percocet for the pain prior to arrival and feels that it helped the pain.  She reports some nausea, but no vomiting.  She has had diarrhea for the past 3 days.  She denies abdominal pain.  Denies fever or chills.  She states that she has had increased urinary frequency, but no urgency or dysuria.  No prior history of Kidney Stones.    The history is provided by the patient.    Past Medical History  Diagnosis Date  . Hyperlipidemia   . Hypertension   . Osteoarthritis   . Wears dentures   . Bruises easily   . Diarrhea   . Abdominal pain   . Wears glasses   . Shortness of breath     WITH EXERTION USES 2 LO2 BEDTIME  . Oxygen desaturation during sleep     USES 2 LITERS BEDTIME   VIA CPAP 06/2012 [facility]   . Anxiety   . Depression   . Sleep apnea     CPAP WITH O2 2 LITERS 2013 ([facility])  . Diabetes mellitus     type II  DIET CONTROLLED  . Chronic headaches     HISTORY OF   . Anemia     NOS  . Hepatitis     AGE 32S    Past Surgical History  Procedure Laterality Date  . Appendectomy    . Cholecystectomy    . Abdominal hysterectomy    . Tonsillectomy    . Revision total knee arthroplasty  2011  . Rotator cuff repair  2010  . Tonsillectomy    . Joint replacement      LEFT  KNEE   . Knee arthroscopy      RIGHT  . Shoulder arthroscopy  09/17/2012    Procedure: ARTHROSCOPY SHOULDER;  Surgeon: Kennieth Rad, MD;  [facility];  Service: Orthopedics;  Laterality: Left;    Family History  Problem Relation Age of Onset  . Alcohol abuse Other   . Hypertension Other   . Kidney disease Other   . Mental illness Other   . Cancer Father     prostate cancer  . Heart disease Sister   . Heart disease Brother   . Emphysema Brother     History  Substance Use Topics  . Smoking status: Never Smoker   . Smokeless tobacco: Never Used  . Alcohol Use: No    OB History   Grav Para Term Preterm Abortions TAB SAB Ect Mult Living                  Review of Systems  Constitutional: Negative for fever and chills.  Gastrointestinal: Positive for nausea and diarrhea. Negative for vomiting and abdominal pain.  Genitourinary: Positive  for frequency and flank pain. Negative for dysuria and urgency.  All other systems reviewed and are negative.    Allergies  Metformin  Home Medications   Current Outpatient Rx  Name  Route  Sig  Dispense  Refill  . ALPRAZolam (XANAX) 1 MG tablet      take 1 tablet by mouth three times a day if needed for sleep   90 tablet   5   . amLODipine (NORVASC) 10 MG tablet   Oral   Take 10 mg by mouth daily.         . Cholecalciferol (VITAMIN D3) 1000 UNITS CAPS   Oral   Take 2,000 Units by mouth daily.          Marland Kitchen dexamethasone (DECADRON) 4 MG tablet   Oral   Take 4 mg by mouth daily with breakfast.         . Enalapril-Hydrochlorothiazide 5-12.5 MG per tablet   Oral   Take 2 tablets by mouth daily.         Marland Kitchen oxyCODONE-acetaminophen (PERCOCET) 10-325 MG per tablet   Oral   Take 1 tablet by mouth every 4 (four) hours as needed for pain.         . pantoprazole (PROTONIX) 40 MG tablet   Oral   Take 1 tablet (40 mg total) by mouth daily.   90 tablet   3   . traZODone (DESYREL) 100 MG tablet   Oral   Take  100 mg by mouth at bedtime.           BP 175/104  Pulse 62  Temp(Src) 98.4 F (36.9 C) (Oral)  Resp 18  SpO2 98%  Physical Exam  Nursing note and vitals reviewed. Constitutional: She appears well-developed and well-nourished.  HENT:  Head: Normocephalic and atraumatic.  Mouth/Throat: Oropharynx is clear and moist.  Neck: Normal range of motion. Neck supple.  Cardiovascular: Normal rate, regular rhythm and normal heart sounds.   Pulmonary/Chest: Effort normal and breath sounds normal.  Abdominal: Soft. Bowel sounds are normal. She exhibits no distension and no mass. There is no tenderness. There is no rebound and no guarding.  Tenderness to palpation of the right flank area  Musculoskeletal: Normal range of motion.  Neurological: She is alert.  Skin: Skin is warm and dry.  Psychiatric: She has a normal mood and affect.    ED Course  Procedures (including critical care time)  Labs Reviewed  CBC WITH DIFFERENTIAL - Abnormal; Notable for the following:    WBC 13.3 (*)    Neutrophils Relative 82 (*)    Neutro Abs 10.9 (*)    All other components within normal limits  COMPREHENSIVE METABOLIC PANEL - Abnormal; Notable for the following:    Potassium 3.2 (*)    Glucose, Bld 136 (*)    GFR calc non Af Amer 86 (*)    All other components within normal limits  URINE CULTURE  URINALYSIS, ROUTINE W REFLEX MICROSCOPIC   Ct Abdomen Pelvis Wo Contrast  04/05/2013  *RADIOLOGY REPORT*  Clinical Data: Flank pain.  CT ABDOMEN AND PELVIS WITHOUT CONTRAST  Technique:  Multidetector CT imaging of the abdomen and pelvis was performed following the standard protocol without intravenous contrast.  Comparison: CT 06/05/2011  Findings: Linear scarring in the right lung base.  The left base is clear.  No effusions.  Heart is normal size.  Prior cholecystectomy.  Liver, spleen, pancreas, adrenals and kidneys have an unremarkable unenhanced appearance.  No renal or  ureteral stones.  No  hydronephrosis.  Urinary bladder is unremarkable.  Prior hysterectomy.  No adnexal masses.  There is sigmoid diverticulosis.  Slight stranding/indistinctness around the sigmoid colon.  Cannot exclude early diverticulitis.  Small bowel is decompressed.  No acute bony abnormality.  IMPRESSION: No renal or ureteral stones.  No hydronephrosis.  Sigmoid diverticulosis.  Slight haziness around the sigmoid colon. Cannot exclude early diverticulitis.  Recommend clinical correlation.   Original Report Authenticated By: Charlett Nose, M.D.    Dg Abd 1 View  04/05/2013  *RADIOLOGY REPORT*  Clinical Data: Right flank pain.  ABDOMEN - 1 VIEW  Comparison: CT 06/05/2011  Findings: Moderate stool burden throughout the transverse colon. Mild gaseous distention of the stomach.  No evidence of bowel obstruction or free air.  Prior cholecystectomy.  No organomegaly or suspicious calcification.  Degenerative changes in the lumbar spine and hips.  IMPRESSION: Moderate stool burden in the transverse colon.  Mild gaseous distention of the stomach.   Original Report Authenticated By: Charlett Nose, M.D.      No diagnosis found.    MDM  Patient presented with a chief complaint of right flank pain for the past week.  Patient has also had nausea and diarrhea over the past 3 days, but no vomiting.  Urine showing small leukocytes and hemoglobin.  CT ab/pelvis negative for renal or ureteral stones.  CT did show possible early Diverticulitis.  Pt currently does not have any LLQ abdominal pain or fever.  Patient started on Cipro to treat UTI and possible early Diverticulitis.  Urine sent for culture.  Return precautions given.        Pascal Lux [place], PA-C 04/06/13 1458

## 2013-04-07 LAB — URINE CULTURE: Colony Count: 55000

## 2013-04-12 ENCOUNTER — Other Ambulatory Visit: Payer: Self-pay | Admitting: Internal Medicine

## 2013-04-15 ENCOUNTER — Telehealth: Payer: Self-pay | Admitting: Internal Medicine

## 2013-04-15 NOTE — Telephone Encounter (Signed)
Patient Information:  Caller Name: Vung  Phone: [phone]  Patient: [name], [name]  Gender: Female  DOB: 31-Mar-1933  Age: 79 Years  PCP: Sanda Linger (Adults only)  Office Follow Up:  Does the office need to follow up with this patient?: No  Instructions For The Office: N/A  RN Note:  Patient is able to stand and function but she is feeling some impact from it.  She is dizzy, a little nauseated and just does not feel good.  Check sugar daily- this morning it was 128.  Home blood pressure device notes BP as 170/60 but patient is not sure of accuracy.  Fluid intake today has been 32 ounces. Swelling from foot up to knee and will pit if pressed. Can wear regular shoes. Triaged with care advice given.  Patient will keep appointment with Dr. Jonny Ruiz  tomorrow 05/23 at 15:46  Symptoms  Reason For Call & Symptoms: Does not feel like she is over the kidney issues  Is dizzy and feels legs are swelling-just not feeling her best  Reviewed Health History In EMR: Yes  Reviewed Medications In EMR: Yes  Reviewed Allergies In EMR: Yes  Reviewed Surgeries / Procedures: Yes  Date of Onset of Symptoms: 04/06/2013  Treatments Tried: treatment for kidney infection is better.  Other symptoms are not  Treatments Tried Worked: No  Guideline(s) Used:  Dizziness  High Blood Pressure  Disposition Per Guideline:   See Within 2 Weeks in Office  Reason For Disposition Reached:   BP > 130/80 and history of heart problems, kidney disease, or diabetes  Advice Given:  Call Back If:  Headache, blurred vision, difficulty talking, or difficulty walking occurs  Chest pain or difficulty breathing occurs  You become worse.  Patient Will Follow Care Advice:  YES  Appointment Scheduled:  04/16/2013 15:45:00 Appointment Scheduled Provider:  Oliver Barre (Adults only)

## 2013-04-16 ENCOUNTER — Encounter: Payer: Self-pay | Admitting: Internal Medicine

## 2013-04-16 ENCOUNTER — Ambulatory Visit (INDEPENDENT_AMBULATORY_CARE_PROVIDER_SITE_OTHER): Payer: Medicare HMO | Admitting: Internal Medicine

## 2013-04-16 ENCOUNTER — Ambulatory Visit: Payer: Medicare HMO

## 2013-04-16 VITALS — BP 120/82 | HR 81 | Temp 98.3°F | Resp 16 | Wt 209.0 lb

## 2013-04-16 DIAGNOSIS — R109 Unspecified abdominal pain: Secondary | ICD-10-CM

## 2013-04-16 DIAGNOSIS — K5732 Diverticulitis of large intestine without perforation or abscess without bleeding: Secondary | ICD-10-CM | POA: Insufficient documentation

## 2013-04-16 DIAGNOSIS — R42 Dizziness and giddiness: Secondary | ICD-10-CM

## 2013-04-16 DIAGNOSIS — E876 Hypokalemia: Secondary | ICD-10-CM | POA: Insufficient documentation

## 2013-04-16 LAB — CBC WITH DIFFERENTIAL/PLATELET
Lymphocytes Relative: 34 % (ref 12–46)
Lymphs Abs: 2.8 10*3/uL (ref 0.7–4.0)
Neutrophils Relative %: 57 % (ref 43–77)
Platelets: 144 10*3/uL — ABNORMAL LOW (ref 150–400)
RBC: 4.06 MIL/uL (ref 3.87–5.11)
WBC: 8.2 10*3/uL (ref 4.0–10.5)

## 2013-04-16 LAB — COMPREHENSIVE METABOLIC PANEL
ALT: 16 U/L (ref 0–35)
Albumin: 3.8 g/dL (ref 3.5–5.2)
CO2: 32 mEq/L (ref 19–32)
GFR: 129.25 mL/min (ref >60.00)
Potassium: 3.9 mEq/L (ref 3.5–5.1)
Sodium: 141 mEq/L (ref 135–145)
Total Bilirubin: 1.1 mg/dL (ref 0.3–1.2)
Total Protein: 6.8 g/dL (ref 6.0–8.3)

## 2013-04-16 LAB — URINALYSIS, ROUTINE W REFLEX MICROSCOPIC
Bilirubin Urine: NEGATIVE
Ketones, ur: NEGATIVE
Total Protein, Urine: NEGATIVE
pH: 6 (ref 5.0–8.0)

## 2013-04-16 LAB — LIPASE: Lipase: 26 U/L (ref 11.0–59.0)

## 2013-04-16 MED ORDER — MECLIZINE HCL 25 MG PO TABS: 25.0000 mg | ORAL_TABLET | Freq: Three times a day (TID) | ORAL | Status: DC | PRN

## 2013-04-16 NOTE — Progress Notes (Signed)
Subjective:    Patient ID: [name], female    DOB: [birth year], 79 y.o.   MRN: [id number]  Abdominal Pain This is a recurrent problem. The current episode started in the past 7 days. The onset quality is gradual. The problem occurs intermittently. The problem has been gradually improving. The pain is located in the right flank. The pain is at a severity of 1/10. The pain is mild. The quality of the pain is aching. The abdominal pain does not radiate. Associated symptoms include constipation and nausea. Pertinent negatives include no anorexia, arthralgias, belching, diarrhea, dysuria, fever, flatus, frequency, headaches, hematochezia, hematuria, melena, myalgias, vomiting or weight loss. The treatment provided moderate relief. Prior diagnostic workup includes CT scan.      Review of Systems  Constitutional: Negative.  Negative for fever, chills, weight loss, diaphoresis, activity change, appetite change, fatigue and unexpected weight change.  HENT: Negative.   Eyes: Negative.   Respiratory: Negative.  Negative for cough, chest tightness, shortness of breath, wheezing and stridor.   Cardiovascular: Negative.  Negative for chest pain, palpitations and leg swelling.  Gastrointestinal: Positive for nausea, abdominal pain and constipation. Negative for vomiting, diarrhea, melena, hematochezia, abdominal distention, anal bleeding, rectal pain, anorexia and flatus.  Endocrine: Negative.   Genitourinary: Positive for flank pain. Negative for dysuria, urgency, frequency, hematuria, decreased urine volume, vaginal bleeding, vaginal discharge, enuresis, difficulty urinating, genital sores, vaginal pain, menstrual problem, pelvic pain and dyspareunia.  Musculoskeletal: Negative.  Negative for myalgias and arthralgias.  Skin: Negative.   Allergic/Immunologic: Negative.   Neurological: Positive for dizziness (and intermittent vertigo). Negative for headaches.  Hematological: Negative.  Negative for  adenopathy. Does not bruise/bleed easily.  Psychiatric/Behavioral: Negative.        Objective:   Physical Exam  Vitals reviewed. Constitutional: She is oriented to person, place, and time. She appears well-developed and well-nourished.  Non-toxic appearance. She does not have a sickly appearance. She does not appear ill. No distress.  HENT:  Head: Normocephalic and atraumatic.  Mouth/Throat: Oropharynx is clear and moist. No oropharyngeal exudate.  Eyes: Conjunctivae are normal. Right eye exhibits no discharge. Left eye exhibits no discharge. No scleral icterus.  Neck: Normal range of motion. Neck supple. No JVD present. No tracheal deviation present. No thyromegaly present.  Cardiovascular: Normal rate, regular rhythm, normal heart sounds and intact distal pulses.  Exam reveals no gallop and no friction rub.   No murmur heard. Pulmonary/Chest: Effort normal and breath sounds normal. No stridor. No respiratory distress. She has no wheezes. She has no rales. She exhibits no tenderness.  Abdominal: Soft. Bowel sounds are normal. She exhibits no distension and no mass. There is no tenderness. There is no rebound and no guarding.  Genitourinary: Rectum normal. Rectal exam shows no external hemorrhoid, no internal hemorrhoid, no fissure, no mass, no tenderness and anal tone normal. Guaiac negative stool.  Musculoskeletal: Normal range of motion. She exhibits no edema and no tenderness.  Lymphadenopathy:    She has no cervical adenopathy.  Neurological: She is oriented to person, place, and time.  Skin: Skin is warm and dry. No rash noted. She is not diaphoretic. No erythema. No pallor.  Psychiatric: She has a normal mood and affect. Her behavior is normal. Judgment and thought content normal.     Lab Results  Component Value Date   WBC 13.3* 04/05/2013   HGB 14.8 04/05/2013   HCT 42.3 04/05/2013   PLT 189 04/05/2013   GLUCOSE 136* 04/05/2013   CHOL 173  12/24/2012   TRIG 102.0 12/24/2012    HDL 67.60 12/24/2012   LDLDIRECT 150.1 04/15/2011   LDLCALC 85 12/24/2012   ALT 22 04/05/2013   AST 23 04/05/2013   NA 138 04/05/2013   K 3.2* 04/05/2013   CL 101 04/05/2013   CREATININE 0.58 04/05/2013   BUN 12 04/05/2013   CO2 27 04/05/2013   TSH 3.50 12/24/2012   INR 2.29* 05/07/2010   HGBA1C 5.5 12/24/2012   Ct Abdomen Pelvis Wo Contrast  04/05/2013   *RADIOLOGY REPORT*  Clinical Data: Flank pain.  CT ABDOMEN AND PELVIS WITHOUT CONTRAST  Technique:  Multidetector CT imaging of the abdomen and pelvis was performed following the standard protocol without intravenous contrast.  Comparison: CT 06/05/2011  Findings: Linear scarring in the right lung base.  The left base is clear.  No effusions.  Heart is normal size.  Prior cholecystectomy.  Liver, spleen, pancreas, adrenals and kidneys have an unremarkable unenhanced appearance.  No renal or ureteral stones.  No hydronephrosis.  Urinary bladder is unremarkable.  Prior hysterectomy.  No adnexal masses.  There is sigmoid diverticulosis.  Slight stranding/indistinctness around the sigmoid colon.  Cannot exclude early diverticulitis.  Small bowel is decompressed.  No acute bony abnormality.  IMPRESSION: No renal or ureteral stones.  No hydronephrosis.  Sigmoid diverticulosis.  Slight haziness around the sigmoid colon. Cannot exclude early diverticulitis.  Recommend clinical correlation.   Original Report Authenticated By: Charlett Nose, M.D.   Dg Abd 1 View  04/05/2013   *RADIOLOGY REPORT*  Clinical Data: Right flank pain.  ABDOMEN - 1 VIEW  Comparison: CT 06/05/2011  Findings: Moderate stool burden throughout the transverse colon. Mild gaseous distention of the stomach.  No evidence of bowel obstruction or free air.  Prior cholecystectomy.  No organomegaly or suspicious calcification.  Degenerative changes in the lumbar spine and hips.  IMPRESSION: Moderate stool burden in the transverse colon.  Mild gaseous distention of the stomach.   Original Report Authenticated  By: Charlett Nose, M.D.     Assessment & Plan:

## 2013-04-16 NOTE — Patient Instructions (Signed)

## 2013-04-18 ENCOUNTER — Encounter: Payer: Self-pay | Admitting: Internal Medicine

## 2013-04-18 NOTE — Assessment & Plan Note (Signed)
Her K+ level has improved

## 2013-04-18 NOTE — Assessment & Plan Note (Signed)
She has had an episode of diverticulitis as documented on the CT scan She has responded well to antibiotics, her WBC has improved She will continue cipro an watch for any worsening s/s

## 2013-04-18 NOTE — Assessment & Plan Note (Signed)
Will try meclizine for symptom relief 

## 2013-04-23 ENCOUNTER — Encounter: Payer: Self-pay | Admitting: Internal Medicine

## 2013-04-23 ENCOUNTER — Ambulatory Visit (INDEPENDENT_AMBULATORY_CARE_PROVIDER_SITE_OTHER): Payer: Medicare HMO | Admitting: Internal Medicine

## 2013-04-23 VITALS — BP 130/78 | HR 72 | Temp 97.3°F | Resp 16 | Wt 213.5 lb

## 2013-04-23 DIAGNOSIS — R42 Dizziness and giddiness: Secondary | ICD-10-CM

## 2013-04-23 DIAGNOSIS — R109 Unspecified abdominal pain: Secondary | ICD-10-CM

## 2013-04-23 MED ORDER — SULFAMETHOXAZOLE-TMP DS 800-160 MG PO TABS: 1.0000 | ORAL_TABLET | Freq: Two times a day (BID) | ORAL | Status: DC

## 2013-04-23 MED ORDER — FLUCONAZOLE 150 MG PO TABS: 150.0000 mg | ORAL_TABLET | Freq: Once | ORAL | Status: DC

## 2013-04-23 NOTE — Progress Notes (Signed)
Subjective:    Patient ID: [name], female    DOB: 1932-12-21, 79 y.o.   MRN: [id number]  Abdominal Pain This is a recurrent problem. The current episode started 1 to 4 weeks ago. The onset quality is gradual. The problem occurs intermittently. The problem has been unchanged. The pain is located in the LLQ. The pain is at a severity of 2/10. The pain is mild. The quality of the pain is cramping. The abdominal pain does not radiate. Pertinent negatives include no anorexia, arthralgias, belching, constipation, diarrhea, dysuria, fever, flatus, frequency, headaches, hematochezia, hematuria, melena, myalgias, nausea, vomiting or weight loss. The pain is aggravated by eating. The pain is relieved by nothing. She has tried antibiotics for the symptoms. The treatment provided mild relief. Prior diagnostic workup includes CT scan.      Review of Systems  Constitutional: Positive for unexpected weight change (some weight gain). Negative for fever, chills, weight loss, diaphoresis, activity change, appetite change and fatigue.  HENT: Negative.   Eyes: Negative.   Respiratory: Negative.  Negative for cough, chest tightness, shortness of breath, wheezing and stridor.   Cardiovascular: Negative.  Negative for chest pain, palpitations and leg swelling.  Gastrointestinal: Positive for abdominal pain. Negative for nausea, vomiting, diarrhea, constipation, blood in stool, melena, hematochezia, abdominal distention, anal bleeding, rectal pain, anorexia and flatus.  Endocrine: Negative.   Genitourinary: Negative.  Negative for dysuria, urgency, frequency, hematuria, flank pain, enuresis and difficulty urinating.  Musculoskeletal: Negative.  Negative for myalgias, back pain, joint swelling, arthralgias and gait problem.  Skin: Negative.  Negative for color change, pallor, rash and wound.  Allergic/Immunologic: Negative.   Neurological: Negative.  Negative for dizziness, weakness, light-headedness and  headaches.  Hematological: Negative.   Psychiatric/Behavioral: Negative.        Objective:   Physical Exam  Vitals reviewed. Constitutional: She is oriented to person, place, and time. She appears well-developed and well-nourished.  Non-toxic appearance. She does not have a sickly appearance. She does not appear ill. No distress.  HENT:  Head: Normocephalic and atraumatic.  Mouth/Throat: Oropharynx is clear and moist. No oropharyngeal exudate.  Eyes: Conjunctivae are normal. Right eye exhibits no discharge. Left eye exhibits no discharge. No scleral icterus.  Neck: Normal range of motion. Neck supple. No JVD present. No tracheal deviation present. No thyromegaly present.  Cardiovascular: Normal rate, regular rhythm, normal heart sounds and intact distal pulses.  Exam reveals no gallop and no friction rub.   No murmur heard. Pulmonary/Chest: Effort normal and breath sounds normal. No stridor. No respiratory distress. She has no wheezes. She has no rales. She exhibits no tenderness.  Abdominal: Soft. Normal appearance and bowel sounds are normal. She exhibits no distension and no mass. There is no hepatosplenomegaly, splenomegaly or hepatomegaly. There is tenderness in the left lower quadrant. There is no rigidity, no rebound, no guarding, no CVA tenderness, no tenderness at McBurney's point and negative Murphy's sign.  Musculoskeletal: Normal range of motion. She exhibits no edema and no tenderness.  Lymphadenopathy:    She has no cervical adenopathy.  Neurological: She is oriented to person, place, and time.  Skin: Skin is warm and dry. No rash noted. She is not diaphoretic. No erythema. No pallor.  Psychiatric: She has a normal mood and affect. Her behavior is normal. Judgment and thought content normal.     Lab Results  Component Value Date   WBC 8.2 04/16/2013   HGB 12.6 04/16/2013   HCT 36.3 04/16/2013   PLT 144*  04/16/2013   GLUCOSE 81 04/16/2013   CHOL 173 12/24/2012   TRIG 102.0  12/24/2012   HDL 67.60 12/24/2012   LDLDIRECT 150.1 04/15/2011   LDLCALC 85 12/24/2012   ALT 16 04/16/2013   AST 18 04/16/2013   NA 141 04/16/2013   K 3.9 04/16/2013   CL 103 04/16/2013   CREATININE 0.6 04/16/2013   BUN 11 04/16/2013   CO2 32 04/16/2013   TSH 3.50 12/24/2012   INR 2.29* 05/07/2010   HGBA1C 5.5 12/24/2012       Assessment & Plan:

## 2013-04-23 NOTE — Patient Instructions (Signed)
Diverticulitis °A diverticulum is a small pouch or sac on the colon. Diverticulosis is the presence of these diverticula on the colon. Diverticulitis is the irritation (inflammation) or infection of diverticula. °CAUSES  °The colon and its diverticula contain bacteria. If food particles block the tiny opening to a diverticulum, the bacteria inside can grow and cause an increase in pressure. This leads to infection and inflammation and is called diverticulitis. °SYMPTOMS  °· Abdominal pain and tenderness. Usually, the pain is located on the left side of your abdomen. However, it could be located elsewhere. °· Fever. °· Bloating. °· Feeling sick to your stomach (nausea). °· Throwing up (vomiting). °· Abnormal stools. °DIAGNOSIS  °Your caregiver will take a history and perform a physical exam. Since many things can cause abdominal pain, other tests may be necessary. Tests may include: °· Blood tests. °· Urine tests. °· X-ray of the abdomen. °· CT scan of the abdomen. °Sometimes, surgery is needed to determine if diverticulitis or other conditions are causing your symptoms. °TREATMENT  °Most of the time, you can be treated without surgery. Treatment includes: °· Resting the bowels by only having liquids for a few days. As you improve, you will need to eat a low-fiber diet. °· Intravenous (IV) fluids if you are losing body fluids (dehydrated). °· Antibiotic medicines that treat infections may be given. °· Pain and nausea medicine, if needed. °· Surgery if the inflamed diverticulum has burst. °HOME CARE INSTRUCTIONS  °· Try a clear liquid diet (broth, tea, or water for as long as directed by your caregiver). You may then gradually begin a low-fiber diet as tolerated.  °A low-fiber diet is a diet with less than 10 grams of fiber. Choose the foods below to reduce fiber in the diet: °· White breads, cereals, rice, and pasta. °· Cooked fruits and vegetables or soft fresh fruits and vegetables without the skin. °· Ground or  well-cooked tender beef, ham, veal, lamb, pork, or poultry. °· Eggs and seafood. °· After your diverticulitis symptoms have improved, your caregiver may put you on a high-fiber diet. A high-fiber diet includes 14 grams of fiber for every 1000 calories consumed. For a standard 2000 calorie diet, you would need 28 grams of fiber. Follow these diet guidelines to help you increase the fiber in your diet. It is important to slowly increase the amount fiber in your diet to avoid gas, constipation, and bloating. °· Choose whole-grain breads, cereals, pasta, and brown rice. °· Choose fresh fruits and vegetables with the skin on. Do not overcook vegetables because the more vegetables are cooked, the more fiber is lost. °· Choose more nuts, seeds, legumes, dried peas, beans, and lentils. °· Look for food products that have greater than 3 grams of fiber per serving on the Nutrition Facts label. °· Take all medicine as directed by your caregiver. °· If your caregiver has given you a follow-up appointment, it is very important that you go. Not going could result in lasting (chronic) or permanent injury, pain, and disability. If there is any problem keeping the appointment, call to reschedule. °SEEK MEDICAL CARE IF:  °· Your pain does not improve. °· You have a hard time advancing your diet beyond clear liquids. °· Your bowel movements do not return to normal. °SEEK IMMEDIATE MEDICAL CARE IF:  °· Your pain becomes worse. °· You have an oral temperature above 102° F (38.9° C), not controlled by medicine. °· You have repeated vomiting. °· You have bloody or black, tarry stools. °·   Symptoms that brought you to your caregiver become worse or are not getting better. °MAKE SURE YOU:  °· Understand these instructions. °· Will watch your condition. °· Will get help right away if you are not doing well or get worse. °Document Released: 08/21/2005 Document Revised: 02/03/2012 Document Reviewed: 12/17/2010 °[employer]® Patient Information  ©2014 [employer]. ° °

## 2013-04-26 NOTE — Assessment & Plan Note (Signed)
This has resolved.

## 2013-04-26 NOTE — Assessment & Plan Note (Signed)
She has improved some on cipro, will change her antibiotic to bactrim-ds to see if she has a better response

## 2013-04-30 ENCOUNTER — Inpatient Hospital Stay: Admission: RE | Admit: 2013-04-30 | Payer: Medicare HMO | Source: Ambulatory Visit

## 2013-05-05 ENCOUNTER — Ambulatory Visit (INDEPENDENT_AMBULATORY_CARE_PROVIDER_SITE_OTHER)
Admission: RE | Admit: 2013-05-05 | Discharge: 2013-05-05 | Disposition: A | Discharge status: Home / Self Care | Payer: Medicare HMO | Source: Ambulatory Visit | Attending: Internal Medicine | Admitting: Internal Medicine

## 2013-05-05 DIAGNOSIS — M899 Disorder of bone, unspecified: Secondary | ICD-10-CM

## 2013-05-05 DIAGNOSIS — M8580 Other specified disorders of bone density and structure, unspecified site: Secondary | ICD-10-CM

## 2013-05-11 LAB — HM DEXA SCAN: HM Dexa Scan: NORMAL

## 2013-05-18 ENCOUNTER — Telehealth: Payer: Self-pay | Admitting: *Deleted

## 2013-05-18 NOTE — Telephone Encounter (Signed)
Her labs look pretty good

## 2013-05-18 NOTE — Telephone Encounter (Signed)
Pt called requesting a return call from Dr Yetta Barre nurse in reference to her lab results.  Please advise pt

## 2013-05-27 ENCOUNTER — Ambulatory Visit: Payer: Medicare HMO | Admitting: Internal Medicine

## 2013-06-15 ENCOUNTER — Other Ambulatory Visit: Payer: Self-pay | Admitting: Internal Medicine

## 2013-06-25 ENCOUNTER — Other Ambulatory Visit: Payer: Self-pay | Admitting: Orthopedic Surgery

## 2013-06-25 DIAGNOSIS — M5430 Sciatica, unspecified side: Secondary | ICD-10-CM

## 2013-07-06 ENCOUNTER — Ambulatory Visit
Admission: RE | Admit: 2013-07-06 | Discharge: 2013-07-06 | Disposition: A | Discharge status: Home / Self Care | Payer: Medicare HMO | Source: Ambulatory Visit | Attending: Orthopedic Surgery | Admitting: Orthopedic Surgery

## 2013-07-06 DIAGNOSIS — M5430 Sciatica, unspecified side: Secondary | ICD-10-CM

## 2013-07-06 MED ORDER — METHYLPREDNISOLONE ACETATE 40 MG/ML INJ SUSP (RADIOLOG
120.0000 mg | Freq: Once | INTRAMUSCULAR | Status: AC
  Administered 2013-07-06: 120 mg via EPIDURAL

## 2013-07-06 MED ORDER — IOHEXOL 180 MG/ML  SOLN
1.0000 mL | Freq: Once | INTRAMUSCULAR | Status: AC | PRN
  Administered 2013-07-06: 1 mL via EPIDURAL

## 2013-07-14 ENCOUNTER — Ambulatory Visit (INDEPENDENT_AMBULATORY_CARE_PROVIDER_SITE_OTHER): Payer: Medicare HMO | Admitting: Internal Medicine

## 2013-07-14 ENCOUNTER — Encounter: Payer: Self-pay | Admitting: Internal Medicine

## 2013-07-14 ENCOUNTER — Other Ambulatory Visit (INDEPENDENT_AMBULATORY_CARE_PROVIDER_SITE_OTHER): Payer: Medicare HMO

## 2013-07-14 VITALS — BP 140/72 | HR 76 | Temp 97.7°F | Resp 16 | Ht 62.0 in | Wt 207.6 lb

## 2013-07-14 DIAGNOSIS — R42 Dizziness and giddiness: Secondary | ICD-10-CM

## 2013-07-14 DIAGNOSIS — I350 Nonrheumatic aortic (valve) stenosis: Secondary | ICD-10-CM | POA: Insufficient documentation

## 2013-07-14 DIAGNOSIS — I10 Essential (primary) hypertension: Secondary | ICD-10-CM

## 2013-07-14 DIAGNOSIS — R011 Cardiac murmur, unspecified: Secondary | ICD-10-CM

## 2013-07-14 DIAGNOSIS — E876 Hypokalemia: Secondary | ICD-10-CM

## 2013-07-14 LAB — BASIC METABOLIC PANEL
BUN: 16 mg/dL (ref 6–23)
CO2: 28 mEq/L (ref 19–32)
GFR: 137.33 mL/min (ref >60.00)
Glucose, Bld: 108 mg/dL — ABNORMAL HIGH (ref 70–99)
Potassium: 3.2 mEq/L — ABNORMAL LOW (ref 3.5–5.1)

## 2013-07-14 MED ORDER — NEBIVOLOL HCL 5 MG PO TABS: 5.0000 mg | ORAL_TABLET | Freq: Every day | ORAL | Status: DC

## 2013-07-14 MED ORDER — MECLIZINE HCL 25 MG PO TABS: 25.0000 mg | ORAL_TABLET | Freq: Three times a day (TID) | ORAL | Status: DC | PRN

## 2013-07-14 MED ORDER — POTASSIUM CHLORIDE CRYS ER 20 MEQ PO TBCR: 20.0000 meq | EXTENDED_RELEASE_TABLET | Freq: Two times a day (BID) | ORAL | Status: DC

## 2013-07-14 NOTE — Assessment & Plan Note (Signed)
This is caused by the HCTZ, her K+ is down to 3.2, I have asked her to start Klor

## 2013-07-14 NOTE — Progress Notes (Signed)
Subjective:    Patient ID: [name], female    DOB: 20-Jan-1933, 79 y.o.   MRN: [id number]  Hypertension This is a chronic problem. The current episode started more than 1 year ago. The problem has been gradually improving since onset. The problem is controlled. Associated symptoms include anxiety. Pertinent negatives include no blurred vision, chest pain, headaches, malaise/fatigue, neck pain, orthopnea, palpitations, peripheral edema, PND, shortness of breath or sweats. There are no associated agents to hypertension. Past treatments include calcium channel blockers, diuretics and ACE inhibitors. The current treatment provides moderate improvement. Compliance problems include medication side effects, exercise and diet.  Identifiable causes of hypertension include sleep apnea.      Review of Systems  Constitutional: Negative.  Negative for fever, chills, malaise/fatigue, diaphoresis, activity change, appetite change, fatigue and unexpected weight change.  HENT: Negative.  Negative for neck pain.   Eyes: Negative.  Negative for blurred vision.  Respiratory: Positive for apnea. Negative for cough, choking, chest tightness, shortness of breath, wheezing and stridor.   Cardiovascular: Negative.  Negative for chest pain, palpitations, orthopnea, leg swelling and PND.  Gastrointestinal: Negative.  Negative for nausea, vomiting, abdominal pain, diarrhea and constipation.  Endocrine: Negative.   Genitourinary: Negative.   Musculoskeletal: Negative.  Negative for myalgias, back pain, joint swelling and gait problem.  Skin: Negative.   Allergic/Immunologic: Negative.   Neurological: Positive for dizziness (intermittent, chronic dizziness and vertigo). Negative for tremors, seizures, syncope, facial asymmetry, speech difficulty, weakness, light-headedness, numbness and headaches.  Hematological: Negative.  Negative for adenopathy. Does not bruise/bleed easily.  Psychiatric/Behavioral: Negative.         Objective:   Physical Exam  Vitals reviewed. Constitutional: She is oriented to person, place, and time. She appears well-developed and well-nourished. No distress.  HENT:  Head: Normocephalic and atraumatic.  Mouth/Throat: Oropharynx is clear and moist. No oropharyngeal exudate.  Eyes: Conjunctivae are normal. Right eye exhibits no discharge. Left eye exhibits no discharge. No scleral icterus.  Neck: Normal range of motion. Neck supple. No JVD present. No tracheal deviation present. No thyromegaly present.  Cardiovascular: Normal rate, regular rhythm, S1 normal, S2 normal and intact distal pulses.  Exam reveals no gallop and no friction rub.   Murmur heard.  Decrescendo systolic murmur is present with a grade of 2/6   No diastolic murmur is present  Pulses:      Carotid pulses are 1+ on the right side, and 1+ on the left side.      Radial pulses are 1+ on the right side, and 1+ on the left side.       Femoral pulses are 1+ on the right side, and 1+ on the left side.      Popliteal pulses are 1+ on the right side, and 1+ on the left side.       Dorsalis pedis pulses are 1+ on the right side, and 1+ on the left side.       Posterior tibial pulses are 1+ on the right side, and 1+ on the left side.  Pulmonary/Chest: Effort normal and breath sounds normal. No stridor. No respiratory distress. She has no wheezes. She has no rales. She exhibits no tenderness.  Abdominal: Soft. Bowel sounds are normal. She exhibits no distension and no mass. There is no tenderness. There is no rebound and no guarding.  Musculoskeletal: Normal range of motion. She exhibits no edema and no tenderness.  Lymphadenopathy:    She has no cervical adenopathy.  Neurological: She is  oriented to person, place, and time.  Skin: Skin is warm and dry. No rash noted. She is not diaphoretic. No erythema. No pallor.  Psychiatric: She has a normal mood and affect. Her behavior is normal. Judgment and thought content  normal.      Lab Results  Component Value Date   WBC 8.2 04/16/2013   HGB 12.6 04/16/2013   HCT 36.3 04/16/2013   PLT 144* 04/16/2013   GLUCOSE 81 04/16/2013   CHOL 173 12/24/2012   TRIG 102.0 12/24/2012   HDL 67.60 12/24/2012   LDLDIRECT 150.1 04/15/2011   LDLCALC 85 12/24/2012   ALT 16 04/16/2013   AST 18 04/16/2013   NA 141 04/16/2013   K 3.9 04/16/2013   CL 103 04/16/2013   CREATININE 0.6 04/16/2013   BUN 11 04/16/2013   CO2 32 04/16/2013   TSH 3.50 12/24/2012   INR 2.29* 05/07/2010   HGBA1C 5.5 12/24/2012      Assessment & Plan:

## 2013-07-14 NOTE — Patient Instructions (Signed)

## 2013-07-15 ENCOUNTER — Encounter: Payer: Self-pay | Admitting: Internal Medicine

## 2013-07-15 DIAGNOSIS — E876 Hypokalemia: Secondary | ICD-10-CM | POA: Insufficient documentation

## 2013-07-15 DIAGNOSIS — H8110 Benign paroxysmal vertigo, unspecified ear: Secondary | ICD-10-CM | POA: Insufficient documentation

## 2013-07-15 NOTE — Assessment & Plan Note (Addendum)
She felt like amlodipine caused dizziness so she will stop taking it I have asked her to replace it with Bystolic

## 2013-07-15 NOTE — Assessment & Plan Note (Signed)
She is doing well on the occasional dose of meclizine

## 2013-07-15 NOTE — Assessment & Plan Note (Signed)
I am concerned that she may have AS so I have asked her to have an ECHO done

## 2013-07-23 ENCOUNTER — Ambulatory Visit (HOSPITAL_COMMUNITY): Payer: Medicare HMO | Attending: Internal Medicine | Admitting: Radiology

## 2013-07-23 DIAGNOSIS — I10 Essential (primary) hypertension: Secondary | ICD-10-CM | POA: Insufficient documentation

## 2013-07-23 DIAGNOSIS — E669 Obesity, unspecified: Secondary | ICD-10-CM | POA: Insufficient documentation

## 2013-07-23 DIAGNOSIS — I359 Nonrheumatic aortic valve disorder, unspecified: Secondary | ICD-10-CM | POA: Insufficient documentation

## 2013-07-23 DIAGNOSIS — R011 Cardiac murmur, unspecified: Secondary | ICD-10-CM | POA: Insufficient documentation

## 2013-07-23 DIAGNOSIS — I079 Rheumatic tricuspid valve disease, unspecified: Secondary | ICD-10-CM | POA: Insufficient documentation

## 2013-07-23 NOTE — Progress Notes (Signed)
Echocardiogram performed.  

## 2013-07-26 ENCOUNTER — Encounter: Payer: Self-pay | Admitting: Internal Medicine

## 2013-07-29 ENCOUNTER — Telehealth: Payer: Self-pay | Admitting: *Deleted

## 2013-07-29 ENCOUNTER — Other Ambulatory Visit: Payer: Self-pay | Admitting: Internal Medicine

## 2013-07-29 DIAGNOSIS — M1990 Unspecified osteoarthritis, unspecified site: Secondary | ICD-10-CM

## 2013-07-29 MED ORDER — HYDROCODONE-ACETAMINOPHEN 10-325 MG PO TABS: 1.0000 | ORAL_TABLET | Freq: Three times a day (TID) | ORAL | Status: DC | PRN

## 2013-07-29 NOTE — Telephone Encounter (Signed)
[name] from [facility] pharmacy called requesting a Norco 10/235mg  refill.  [name] states the Rx was not prescribed by Dr Yetta Barre originally and the license of the prescriber has expired.  Further states the Rx is 5 days early.  Please advise

## 2013-07-29 NOTE — Telephone Encounter (Signed)
done

## 2013-08-03 ENCOUNTER — Other Ambulatory Visit: Payer: Self-pay | Admitting: Orthopedic Surgery

## 2013-08-03 DIAGNOSIS — M549 Dorsalgia, unspecified: Secondary | ICD-10-CM

## 2013-08-09 ENCOUNTER — Ambulatory Visit
Admission: RE | Admit: 2013-08-09 | Discharge: 2013-08-09 | Disposition: A | Discharge status: Home / Self Care | Payer: Commercial Managed Care - HMO | Source: Ambulatory Visit | Attending: Orthopedic Surgery | Admitting: Orthopedic Surgery

## 2013-08-09 VITALS — BP 211/77 | HR 69

## 2013-08-09 DIAGNOSIS — M549 Dorsalgia, unspecified: Secondary | ICD-10-CM

## 2013-08-09 MED ORDER — IOHEXOL 180 MG/ML  SOLN
1.0000 mL | Freq: Once | INTRAMUSCULAR | Status: AC | PRN
  Administered 2013-08-09: 1 mL via EPIDURAL

## 2013-08-09 MED ORDER — METHYLPREDNISOLONE ACETATE 40 MG/ML INJ SUSP (RADIOLOG
120.0000 mg | Freq: Once | INTRAMUSCULAR | Status: AC
  Administered 2013-08-09: 120 mg via EPIDURAL

## 2013-09-13 ENCOUNTER — Other Ambulatory Visit: Payer: Self-pay | Admitting: Internal Medicine

## 2013-10-15 ENCOUNTER — Ambulatory Visit (INDEPENDENT_AMBULATORY_CARE_PROVIDER_SITE_OTHER): Payer: Medicare HMO | Admitting: Pulmonary Disease

## 2013-10-15 ENCOUNTER — Encounter: Payer: Self-pay | Admitting: Pulmonary Disease

## 2013-10-15 VITALS — BP 132/78 | HR 56 | Ht 62.0 in | Wt 212.8 lb

## 2013-10-15 DIAGNOSIS — G4736 Sleep related hypoventilation in conditions classified elsewhere: Secondary | ICD-10-CM

## 2013-10-15 DIAGNOSIS — G4739 Other sleep apnea: Secondary | ICD-10-CM

## 2013-10-15 DIAGNOSIS — G4733 Obstructive sleep apnea (adult) (pediatric): Secondary | ICD-10-CM

## 2013-10-15 DIAGNOSIS — E662 Morbid (severe) obesity with alveolar hypoventilation: Secondary | ICD-10-CM

## 2013-10-15 DIAGNOSIS — IMO0002 Reserved for concepts with insufficient information to code with codable children: Secondary | ICD-10-CM

## 2013-10-15 NOTE — Progress Notes (Signed)
Chief Complaint  Patient presents with  . Follow-up    Annual ov.  Pt has no complaints at this time.  Pt has not been using 02 every night.    History of Present Illness: [name] is a 79 y.o. female with OSA/OHS.  She has been on CPAP 10 with 1 liter oxygen at night.  She has been using CPAP on regular basis.  She is not sure the machine is working the same as before.  She is getting more tired during the day.    She goes to bed at 930 pm after taking trazodone at 830 pm.  She wakes up several times during the night to use the bathroom.  She gets out of bed at 5 am.  Her weight has been steady.  She uses xanax and norco, but these have been chronic and w/o change in dose.  TESTS: PSG 06/15/12>>AHI 5.7, SpO2 low 83%. Added 1 liter oxygen.  CPAP titration 07/29/12>>CPAP 10 cm H2O with 1 liter oxygen. Echo 07/23/13 >> EF 65 to 70%, mod LVH, mild AS   Tija H Bordas  has a past medical history of Hyperlipidemia; Hypertension; Osteoarthritis; Wears dentures; Bruises easily; Diarrhea; Abdominal pain; Wears glasses; Shortness of breath; Oxygen desaturation during sleep; Anxiety; Depression; Sleep apnea; Diabetes mellitus; Chronic headaches; Anemia; and Hepatitis.  CRISTIANNA CYR  has past surgical history that includes Appendectomy; Cholecystectomy; Abdominal hysterectomy; Tonsillectomy; Revision total knee arthroplasty (2011); Rotator cuff repair (2010); Tonsillectomy; Joint replacement; Knee arthroscopy; and Shoulder arthroscopy (09/17/2012).  Prior to Admission medications   Medication Sig Start Date End Date Taking? Authorizing Provider  ALPRAZolam Prudy Feeler) 1 MG tablet take 1 tablet by mouth three times a day if needed for sleep 09/13/13  Yes Etta Grandchild, MD  Cholecalciferol (VITAMIN D3) 1000 UNITS CAPS Take 2,000 Units by mouth daily.    Yes Historical Provider, MD  Enalapril-Hydrochlorothiazide 5-12.5 MG per tablet Take 2 tablets by mouth daily.   Yes Historical Provider, MD   HYDROcodone-acetaminophen (NORCO) 10-325 MG per tablet Take 1 tablet by mouth every 8 (eight) hours as needed for pain. 07/29/13  Yes Etta Grandchild, MD  meclizine (ANTIVERT) 25 MG tablet Take 1 tablet (25 mg total) by mouth 3 (three) times daily as needed. 07/14/13  Yes Etta Grandchild, MD  nebivolol (BYSTOLIC) 5 MG tablet Take 1 tablet (5 mg total) by mouth daily. 07/14/13  Yes Etta Grandchild, MD  potassium chloride SA (K-DUR,KLOR-CON) 20 MEQ tablet Take 1 tablet (20 mEq total) by mouth 2 (two) times daily. 07/14/13  Yes Etta Grandchild, MD  traZODone (DESYREL) 100 MG tablet take 1 tablet by mouth at bedtime 06/15/13  Yes Etta Grandchild, MD    Allergies  Allergen Reactions  . Amlodipine     dizziness  . Metformin Diarrhea    Diarrhea, IBS     Physical Exam:  General - No distress ENT - No sinus tenderness, no oral exudate, no LAN Cardiac - s1s2 regular, no murmur Chest - No wheeze/rales/dullness Back - No focal tenderness Abd - Soft, non-tender Ext - No edema Neuro - Normal strength Skin - No rashes Psych - normal mood, and behavior   Assessment/Plan:  Coralyn Helling, MD [place] [facility] Pager:  [phone]

## 2013-10-15 NOTE — Patient Instructions (Signed)
Will arrange for overnight oxygen test and get CPAP report >> will call with results Follow up in 3 months

## 2013-10-17 NOTE — Assessment & Plan Note (Signed)
Related to obesity/hypoventilation and chronic narcotic use.  Will arrange for overnight oximetry with CPAP and 1 liter oxygen, and then determine if adjustments to her set up are needed.

## 2013-10-17 NOTE — Assessment & Plan Note (Signed)
She reports increased sleep disruption and daytime sleepiness in spite of compliance with CPAP.  Will get her CPAP download.  Depending on results she may need in lab titration study.  She also has hypoventilation, and may need to consider transition to BiPAP/ASV.  Explained that best to defer addition of stimulant medications for now until ensured no other correctable cause for her increased daytime sleepiness.

## 2013-10-25 ENCOUNTER — Telehealth: Payer: Self-pay | Admitting: Pulmonary Disease

## 2013-10-25 NOTE — Telephone Encounter (Signed)
I called and spoke with pt. She reports she had ONO 10/18/13. Please advise Dr. Craige Cotta thanks

## 2013-10-26 ENCOUNTER — Other Ambulatory Visit (INDEPENDENT_AMBULATORY_CARE_PROVIDER_SITE_OTHER): Payer: Medicare HMO

## 2013-10-26 ENCOUNTER — Emergency Department (HOSPITAL_COMMUNITY): Payer: Medicare HMO

## 2013-10-26 ENCOUNTER — Ambulatory Visit (INDEPENDENT_AMBULATORY_CARE_PROVIDER_SITE_OTHER): Payer: Commercial Managed Care - HMO | Admitting: Internal Medicine

## 2013-10-26 ENCOUNTER — Telehealth: Payer: Self-pay

## 2013-10-26 ENCOUNTER — Encounter: Payer: Self-pay | Admitting: Internal Medicine

## 2013-10-26 ENCOUNTER — Encounter (HOSPITAL_COMMUNITY): Payer: Self-pay | Admitting: Emergency Medicine

## 2013-10-26 ENCOUNTER — Inpatient Hospital Stay (HOSPITAL_COMMUNITY)
Admission: EM | Admit: 2013-10-26 | Discharge: 2013-10-29 | DRG: 287 | Disposition: A | Discharge status: Home / Self Care | Payer: Medicare HMO | Attending: Internal Medicine | Admitting: Internal Medicine

## 2013-10-26 VITALS — BP 126/76 | HR 64 | Temp 98.4°F | Resp 16 | Ht 62.0 in | Wt 218.0 lb

## 2013-10-26 DIAGNOSIS — I2789 Other specified pulmonary heart diseases: Secondary | ICD-10-CM | POA: Diagnosis present

## 2013-10-26 DIAGNOSIS — F329 Major depressive disorder, single episode, unspecified: Secondary | ICD-10-CM

## 2013-10-26 DIAGNOSIS — E042 Nontoxic multinodular goiter: Secondary | ICD-10-CM

## 2013-10-26 DIAGNOSIS — I5031 Acute diastolic (congestive) heart failure: Principal | ICD-10-CM | POA: Diagnosis present

## 2013-10-26 DIAGNOSIS — I350 Nonrheumatic aortic (valve) stenosis: Secondary | ICD-10-CM

## 2013-10-26 DIAGNOSIS — F411 Generalized anxiety disorder: Secondary | ICD-10-CM

## 2013-10-26 DIAGNOSIS — R079 Chest pain, unspecified: Secondary | ICD-10-CM

## 2013-10-26 DIAGNOSIS — R0600 Dyspnea, unspecified: Secondary | ICD-10-CM

## 2013-10-26 DIAGNOSIS — E119 Type 2 diabetes mellitus without complications: Secondary | ICD-10-CM | POA: Diagnosis present

## 2013-10-26 DIAGNOSIS — G4700 Insomnia, unspecified: Secondary | ICD-10-CM | POA: Diagnosis present

## 2013-10-26 DIAGNOSIS — E279 Disorder of adrenal gland, unspecified: Secondary | ICD-10-CM

## 2013-10-26 DIAGNOSIS — Z7982 Long term (current) use of aspirin: Secondary | ICD-10-CM

## 2013-10-26 DIAGNOSIS — R0609 Other forms of dyspnea: Secondary | ICD-10-CM

## 2013-10-26 DIAGNOSIS — E876 Hypokalemia: Secondary | ICD-10-CM | POA: Insufficient documentation

## 2013-10-26 DIAGNOSIS — I10 Essential (primary) hypertension: Secondary | ICD-10-CM

## 2013-10-26 DIAGNOSIS — F3289 Other specified depressive episodes: Secondary | ICD-10-CM

## 2013-10-26 DIAGNOSIS — G4736 Sleep related hypoventilation in conditions classified elsewhere: Secondary | ICD-10-CM

## 2013-10-26 DIAGNOSIS — I359 Nonrheumatic aortic valve disorder, unspecified: Secondary | ICD-10-CM

## 2013-10-26 DIAGNOSIS — Z96659 Presence of unspecified artificial knee joint: Secondary | ICD-10-CM

## 2013-10-26 DIAGNOSIS — E669 Obesity, unspecified: Secondary | ICD-10-CM | POA: Diagnosis present

## 2013-10-26 DIAGNOSIS — E041 Nontoxic single thyroid nodule: Secondary | ICD-10-CM | POA: Diagnosis present

## 2013-10-26 DIAGNOSIS — R9431 Abnormal electrocardiogram [ECG] [EKG]: Secondary | ICD-10-CM | POA: Insufficient documentation

## 2013-10-26 DIAGNOSIS — R0789 Other chest pain: Secondary | ICD-10-CM

## 2013-10-26 DIAGNOSIS — K219 Gastro-esophageal reflux disease without esophagitis: Secondary | ICD-10-CM

## 2013-10-26 DIAGNOSIS — G4733 Obstructive sleep apnea (adult) (pediatric): Secondary | ICD-10-CM

## 2013-10-26 DIAGNOSIS — Z79899 Other long term (current) drug therapy: Secondary | ICD-10-CM

## 2013-10-26 DIAGNOSIS — I509 Heart failure, unspecified: Secondary | ICD-10-CM | POA: Diagnosis present

## 2013-10-26 DIAGNOSIS — R0602 Shortness of breath: Secondary | ICD-10-CM

## 2013-10-26 DIAGNOSIS — E785 Hyperlipidemia, unspecified: Secondary | ICD-10-CM | POA: Diagnosis present

## 2013-10-26 DIAGNOSIS — IMO0002 Reserved for concepts with insufficient information to code with codable children: Secondary | ICD-10-CM

## 2013-10-26 DIAGNOSIS — Z23 Encounter for immunization: Secondary | ICD-10-CM

## 2013-10-26 HISTORY — DX: Chronic diastolic (congestive) heart failure: I50.32

## 2013-10-26 HISTORY — DX: Nonrheumatic aortic (valve) stenosis: I35.0

## 2013-10-26 LAB — CBC WITH DIFFERENTIAL/PLATELET
Basophils Absolute: 0 10*3/uL (ref 0.0–0.1)
Basophils Relative: 0.2 % (ref 0.0–3.0)
Eosinophils Absolute: 0.3 10*3/uL (ref 0.0–0.7)
Eosinophils Relative: 4.1 % (ref 0.0–5.0)
HCT: 37.8 % (ref 36.0–46.0)
Hemoglobin: 12.6 g/dL (ref 12.0–15.0)
Lymphocytes Relative: 34.4 % (ref 12.0–46.0)
Lymphs Abs: 2.2 10*3/uL (ref 0.7–4.0)
MCHC: 33.3 g/dL (ref 30.0–36.0)
MCV: 91.7 fl (ref 78.0–100.0)
Monocytes Absolute: 0.5 10*3/uL (ref 0.1–1.0)
Monocytes Relative: 7.6 % (ref 3.0–12.0)
Neutro Abs: 3.4 10*3/uL (ref 1.4–7.7)
Neutrophils Relative %: 53.7 % (ref 43.0–77.0)
Platelets: 130 10*3/uL — ABNORMAL LOW (ref 150.0–400.0)
RBC: 4.12 Mil/uL (ref 3.87–5.11)
RDW: 13.2 % (ref 11.5–14.6)
WBC: 6.3 10*3/uL (ref 4.5–10.5)

## 2013-10-26 LAB — URINALYSIS, ROUTINE W REFLEX MICROSCOPIC
Nitrite: NEGATIVE
Specific Gravity, Urine: 1.025 (ref 1.000–1.030)
Total Protein, Urine: NEGATIVE
Urine Glucose: NEGATIVE
Urobilinogen, UA: 0.2 (ref 0.0–1.0)

## 2013-10-26 LAB — BASIC METABOLIC PANEL WITH GFR
BUN: 11 mg/dL (ref 6–23)
CO2: 34 meq/L — ABNORMAL HIGH (ref 19–32)
Calcium: 9.3 mg/dL (ref 8.4–10.5)
Chloride: 104 meq/L (ref 96–112)
Creatinine, Ser: 0.6 mg/dL (ref 0.4–1.2)
GFR: 129.07 mL/min
Glucose, Bld: 102 mg/dL — ABNORMAL HIGH (ref 70–99)
Potassium: 4.1 meq/L (ref 3.5–5.1)
Sodium: 142 meq/L (ref 135–145)

## 2013-10-26 LAB — TSH: TSH: 7.83 u[IU]/mL — ABNORMAL HIGH (ref 0.35–5.50)

## 2013-10-26 LAB — POCT I-STAT, CHEM 8
Chloride: 101 mEq/L (ref 96–112)
HCT: 42 % (ref 36.0–46.0)
Hemoglobin: 14.3 g/dL (ref 12.0–15.0)
Potassium: 3.4 mEq/L — ABNORMAL LOW (ref 3.5–5.1)
Sodium: 141 mEq/L (ref 135–145)

## 2013-10-26 LAB — PRO B NATRIURETIC PEPTIDE: Pro B Natriuretic peptide (BNP): 538.7 pg/mL — ABNORMAL HIGH (ref 0–450)

## 2013-10-26 LAB — POCT I-STAT TROPONIN I: Troponin i, poc: 0 ng/mL (ref 0.00–0.08)

## 2013-10-26 LAB — D-DIMER, QUANTITATIVE: D-Dimer, Quant: 0.89 ug{FEU}/mL — ABNORMAL HIGH (ref 0.00–0.48)

## 2013-10-26 LAB — MAGNESIUM: Magnesium: 1.6 mg/dL (ref 1.5–2.5)

## 2013-10-26 LAB — TROPONIN I: Troponin I: <0.01 ng/mL

## 2013-10-26 MED ORDER — TRAZODONE HCL 100 MG PO TABS: 200.0000 mg | ORAL_TABLET | Freq: Every day | ORAL | Status: DC

## 2013-10-26 MED ORDER — IOHEXOL 350 MG/ML SOLN
80.0000 mL | Freq: Once | INTRAVENOUS | Status: AC | PRN
  Administered 2013-10-26: 80 mL via INTRAVENOUS

## 2013-10-26 MED ORDER — FUROSEMIDE 40 MG PO TABS: 40.0000 mg | ORAL_TABLET | Freq: Every day | ORAL | Status: DC

## 2013-10-26 NOTE — Telephone Encounter (Signed)
CPAP download 09/19/13 to 10/18/13 >> Used on 12 of 30 nights with average 4 hrs 39 min.  Average AHI 0.6 with CPAP 10 cm H2O.  Please inform pt that CPAP report looks good, and no change needed to CPAP pressure setting at this time.  She needs to use CPAP for entire time she is asleep to get maximal benefit.  Please also inform her that I have not received ONO report yet.  Will call her again when results are available.

## 2013-10-26 NOTE — Telephone Encounter (Signed)
Returning call to someone °

## 2013-10-26 NOTE — Patient Instructions (Signed)

## 2013-10-26 NOTE — Assessment & Plan Note (Signed)
I do not think the slightly elevated D-dimer is significant and in light of her bradycardia on EKG I don't think she has a PE. Her BNP was slightly elevated and lasix was started by a colleague who received a call earlier today about the labs. She is on aspirin as well. Her labs today show a very slight increase in the TSH, I don't think this is causing her SOB, will not treat this for now but will recheck in 3-4 weeks. Her EKG is abnormal so an ETT is not an option to see if she has CAD, I have asked her to have a MPI/lexiscan done

## 2013-10-26 NOTE — Assessment & Plan Note (Signed)
Her K+ level is normal now 

## 2013-10-26 NOTE — ED Notes (Signed)
Pt. received a call from Dr. Raford Pitcher office to go to ER due elevated D-dimer , pt. was seen at Dr. Barnett Applebaum office for her SOB and edema at her upper extremities / dizziness for several days . Blood test done at PCP's office today .

## 2013-10-26 NOTE — Telephone Encounter (Signed)
Call from [facility] reporting a critical D-dimer at 0.89. The report has been given to [place] [facility] [place] for follow up.     KP

## 2013-10-26 NOTE — Progress Notes (Signed)
Pre visit review using our clinic review tool, if applicable. No additional management support is needed unless otherwise documented below in the visit note. 

## 2013-10-26 NOTE — Progress Notes (Signed)
Subjective:    Patient ID: [name], female    DOB: [birth year], 79 y.o.   MRN: [id number]  Shortness of Breath This is a recurrent problem. The current episode started 1 to 4 weeks ago. The problem occurs intermittently. The problem has been unchanged. Pertinent negatives include no abdominal pain, chest pain, claudication, coryza, ear pain, fever, headaches, hemoptysis, leg pain, leg swelling, neck pain, orthopnea, PND, rash, rhinorrhea, sore throat, sputum production, swollen glands, syncope, vomiting or wheezing. She has tried nothing for the symptoms. There is no history of asthma, bronchiolitis, CAD, chronic lung disease, COPD, DVT, a heart failure, PE, pneumonia or a recent surgery.      Review of Systems  Constitutional: Positive for fatigue. Negative for fever, chills, diaphoresis, activity change, appetite change and unexpected weight change.  HENT: Negative.  Negative for ear pain, rhinorrhea and sore throat.   Eyes: Negative.   Respiratory: Positive for shortness of breath. Negative for apnea, cough, hemoptysis, sputum production, choking, chest tightness, wheezing and stridor.   Cardiovascular: Negative.  Negative for chest pain, palpitations, orthopnea, claudication, leg swelling, syncope and PND.  Gastrointestinal: Negative.  Negative for nausea, vomiting, abdominal pain, diarrhea, constipation and blood in stool.  Endocrine: Negative.   Genitourinary: Negative.   Musculoskeletal: Negative.  Negative for arthralgias, joint swelling, myalgias and neck pain.  Skin: Negative.  Negative for rash.  Allergic/Immunologic: Negative.   Neurological: Negative.  Negative for dizziness, tremors, syncope, speech difficulty, weakness, light-headedness, numbness and headaches.  Hematological: Negative.  Negative for adenopathy. Does not bruise/bleed easily.  Psychiatric/Behavioral: Negative.        Objective:   Physical Exam  Vitals reviewed. Constitutional: She is oriented to  person, place, and time. She appears well-developed and well-nourished. No distress.  HENT:  Head: Normocephalic and atraumatic.  Mouth/Throat: Oropharynx is clear and moist. No oropharyngeal exudate.  Eyes: Conjunctivae are normal. Right eye exhibits no discharge. Left eye exhibits no discharge. No scleral icterus.  Neck: Normal range of motion. Neck supple. No JVD present. No tracheal deviation present. No thyromegaly present.  Cardiovascular: Normal rate, regular rhythm, S1 normal, S2 normal and intact distal pulses.  Exam reveals no gallop, no S3, no S4 and no friction rub.   Murmur heard.  Decrescendo systolic murmur is present with a grade of 1/6   No diastolic murmur is present  Pulses:      Carotid pulses are 1+ on the right side, and 1+ on the left side.      Radial pulses are 1+ on the right side, and 1+ on the left side.       Femoral pulses are 1+ on the right side, and 1+ on the left side.      Popliteal pulses are 1+ on the right side, and 1+ on the left side.       Dorsalis pedis pulses are 1+ on the right side, and 1+ on the left side.       Posterior tibial pulses are 1+ on the right side, and 1+ on the left side.  Pulmonary/Chest: Effort normal and breath sounds normal. No stridor. No respiratory distress. She has no wheezes. She has no rales. She exhibits no tenderness.  Abdominal: Soft. Bowel sounds are normal. She exhibits no distension and no mass. There is no tenderness. There is no rebound and no guarding.  Musculoskeletal: Normal range of motion. She exhibits no edema and no tenderness.  Lymphadenopathy:    She has no cervical adenopathy.  Neurological: She is oriented to person, place, and time.  Skin: Skin is warm and dry. No rash noted. She is not diaphoretic. No erythema. No pallor.  Psychiatric: She has a normal mood and affect. Her behavior is normal. Judgment and thought content normal.     Lab Results  Component Value Date   WBC 8.2 04/16/2013   HGB 12.6  04/16/2013   HCT 36.3 04/16/2013   PLT 144* 04/16/2013   GLUCOSE 108* 07/14/2013   CHOL 173 12/24/2012   TRIG 102.0 12/24/2012   HDL 67.60 12/24/2012   LDLDIRECT 150.1 04/15/2011   LDLCALC 85 12/24/2012   ALT 16 04/16/2013   AST 18 04/16/2013   NA 135 07/14/2013   K 3.2* 07/14/2013   CL 97 07/14/2013   CREATININE 0.6 07/14/2013   BUN 16 07/14/2013   CO2 28 07/14/2013   TSH 3.50 12/24/2012   INR 2.29* 05/07/2010   HGBA1C 5.5 12/24/2012       Assessment & Plan:

## 2013-10-26 NOTE — ED Notes (Signed)
Two nurses attempted IV. IV team paged  

## 2013-10-26 NOTE — Telephone Encounter (Signed)
lmtcb x1 

## 2013-10-26 NOTE — Assessment & Plan Note (Signed)
Her EKG is unchanged from her prior EKG with what appears to be a loss of voltage in the anterior leads - there are no acute changes. Her BNP is slightly elevated but the cardiac enzymes are negative. I have ordered an MPI to see if she has CAD

## 2013-10-26 NOTE — ED Provider Notes (Signed)
CSN: [id number]     Arrival date & time 10/26/13  1931 History   First MD Initiated Contact with Patient 10/26/13 2044     Chief Complaint  Patient presents with  . Shortness of Breath   (Consider location/radiation/quality/duration/timing/severity/associated sxs/prior Treatment) HPI Comments: Patient sent from PCPs office with three-day history of shortness of breath, edema of her upper and lower extremities and chest pain on exertion. She describes a "tightness" in her central chest that comes and goes, not clearly exertional.  She had elevated d-dimer and was referred to the ED. She reports feeling short of breath for the past 3 days without any cough or fever. Denies any history of CHF or CAD. She has hypertension hyperlipidemia. She is a diabetic. She has sleep apnea and uses CPAP at home.  Patient has had a 2-D echo in August of this year which showed EF of 65-70% with mild aortic stenosis Saw her PCP who was planning to have her see cardiology for stress test and questioned symptomatic aortic stenosis.   The history is provided by the patient.    Past Medical History  Diagnosis Date  . Hyperlipidemia   . Hypertension   . Osteoarthritis   . Wears dentures   . Bruises easily   . Diarrhea   . Abdominal pain   . Wears glasses   . Shortness of breath     WITH EXERTION USES 2 LO2 BEDTIME  . Oxygen desaturation during sleep     USES 2 LITERS BEDTIME   VIA CPAP 06/2012 [facility]   . Anxiety   . Depression   . Sleep apnea     CPAP WITH O2 2 LITERS 2013 ([facility])  . Diabetes mellitus     type II  DIET CONTROLLED  . Chronic headaches     HISTORY OF   . Anemia     NOS  . Hepatitis     AGE 32S   Past Surgical History  Procedure Laterality Date  . Appendectomy    . Cholecystectomy    . Abdominal hysterectomy    . Tonsillectomy    . Revision total knee arthroplasty  2011  . Rotator cuff repair  2010  . Tonsillectomy    . Joint replacement      LEFT KNEE   . Knee  arthroscopy      RIGHT  . Shoulder arthroscopy  09/17/2012    Procedure: ARTHROSCOPY SHOULDER;  Surgeon: Kennieth Rad, MD;  Location: [facility] OR;  Service: Orthopedics;  Laterality: Left;   Family History  Problem Relation Age of Onset  . Alcohol abuse Other   . Hypertension Other   . Kidney disease Other   . Mental illness Other   . Cancer Father     prostate cancer  . Heart disease Sister   . Heart disease Brother   . Emphysema Brother    History  Substance Use Topics  . Smoking status: Never Smoker   . Smokeless tobacco: Never Used  . Alcohol Use: No   OB History   Grav Para Term Preterm Abortions TAB SAB Ect Mult Living                 Review of Systems  Constitutional: Positive for activity change and appetite change. Negative for fever.  Respiratory: Positive for cough, chest tightness and shortness of breath.   Cardiovascular: Positive for chest pain and leg swelling.  Gastrointestinal: Negative for nausea, vomiting and abdominal pain.  Genitourinary: Negative for  dysuria and hematuria.  Musculoskeletal: Negative for back pain.  Skin: Negative for rash.  Neurological: Positive for light-headedness. Negative for dizziness, weakness and headaches.  A complete 10 system review of systems was obtained and all systems are negative except as noted in the HPI and PMH.    Allergies  Amlodipine and Metformin  Home Medications   No current outpatient prescriptions on file. BP 144/64  Pulse 56  Temp(Src) 98.1 F (36.7 C) (Oral)  Resp 20  Ht 5\' 2"  (1.575 m)  Wt 214 lb 8.1 oz (97.3 kg)  BMI 39.22 kg/m2  SpO2 94% Physical Exam  Constitutional: She is oriented to person, place, and time. She appears well-developed and well-nourished. No distress.  HENT:  Head: Normocephalic and atraumatic.  Mouth/Throat: Oropharynx is clear and moist. No oropharyngeal exudate.  Eyes: EOM are normal. Pupils are equal, round, and reactive to light.  Neck: Normal range of motion. Neck  supple.  Cardiovascular: Normal rate and regular rhythm.   Murmur heard. Pulmonary/Chest: Effort normal and breath sounds normal. No respiratory distress.  Abdominal: Soft. There is no tenderness. There is no rebound and no guarding.  Musculoskeletal: Normal range of motion. She exhibits no edema and no tenderness.  Neurological: She is alert and oriented to person, place, and time. No cranial nerve deficit. She exhibits normal muscle tone. Coordination normal.  CN 2-12 intact, no ataxia on finger to nose, no nystagmus, 5/5 strength throughout, no pronator drift, Romberg negative, normal gait.   Skin: Skin is warm.    ED Course  Procedures (including critical care time) Labs Review Labs Reviewed  PRO B NATRIURETIC PEPTIDE - Abnormal; Notable for the following:    Pro B Natriuretic peptide (BNP) 538.7 (*)    All other components within normal limits  COMPREHENSIVE METABOLIC PANEL - Abnormal; Notable for the following:    Glucose, Bld 105 (*)    Albumin 3.4 (*)    GFR calc non Af Amer 88 (*)    All other components within normal limits  CBC WITH DIFFERENTIAL - Abnormal; Notable for the following:    Platelets 128 (*)    All other components within normal limits  POCT I-STAT, CHEM 8 - Abnormal; Notable for the following:    Potassium 3.4 (*)    Glucose, Bld 110 (*)    All other components within normal limits  POCT I-STAT 3, BLOOD GAS (G3+) - Abnormal; Notable for the following:    pH, Arterial 7.475 (*)    pO2, Arterial 67.0 (*)    Bicarbonate 31.9 (*)    Acid-Base Excess 7.0 (*)    All other components within normal limits  TROPONIN I  TROPONIN I  TSH  TROPONIN I  CBC  POCT I-STAT TROPONIN I   Imaging Review Dg Chest 2 View  10/26/2013   CLINICAL DATA:  Shortness of breath and chest tightness.  EXAM: CHEST - 2 VIEW  COMPARISON:  09/09/2012  FINDINGS: The heart size is at the upper limits of normal. There is stable tortuosity of the thoracic aorta. There is no evidence of  pulmonary edema, consolidation, pneumothorax, nodule or pleural fluid. The visualized bony thorax is unremarkable.  IMPRESSION: No active disease.   Electronically Signed   By: Irish Lack M.D.   On: 10/26/2013 20:34   Ct Head Wo Contrast  10/26/2013   CLINICAL DATA:  *Elevated D-dimer, edema.  EXAM: CT HEAD WITHOUT CONTRAST  TECHNIQUE: Contiguous axial images were obtained from the base of the skull through  the vertex without intravenous contrast.  COMPARISON:  None.  FINDINGS: Mild-to-moderate subcortical and periventricular white matter hypodensities, a nonspecific finding often seen in the setting of chronic microangiopathic change. No definite CT evidence of an acute infarction. No intraparenchymal hemorrhage, mass, mass, or abnormal extra-axial fluid collection. The visualized paranasal sinuses and mastoid air cells are predominantly clear.  IMPRESSION: Mild to moderate white matter changes as above. No CT evidence of an acute intracranial abnormality.   Electronically Signed   By: Jearld Lesch M.D.   On: 10/26/2013 22:58   Ct Angio Chest Pe W/cm &/or Wo Cm  10/26/2013   CLINICAL DATA:  Shortness of breath, elevated D-dimer  EXAM: CT ANGIOGRAPHY CHEST WITH CONTRAST  TECHNIQUE: Multidetector CT imaging of the chest was performed using the standard protocol during bolus administration of intravenous contrast. Multiplanar CT image reconstructions including MIPs were obtained to evaluate the vascular anatomy.  CONTRAST:  80mL OMNIPAQUE IOHEXOL 350 MG/ML SOLN  COMPARISON:  10/26/2013 chest radiograph, 04/05/2013 abdominal pelvic CT  FINDINGS: No pulmonary arterial branch filling defect. The main pulmonary artery measures 3.6 cm in transverse diameter.  Enlarged thyroid gland. Rounded calcification within the isthmic region. Normal caliber aorta with scattered atherosclerotic disease.  Heart size upper normal to mildly enlarged. Coronary artery and aortic valvular calcifications.  No pleural or  pericardial effusion. No intrathoracic lymphadenopathy.  Limited upper abdominal images show an indeterminate right adrenal nodule measuring 2.1 cm.  Lung evaluation suboptimal in the expiratory phase and with respiratory motion. Within this limitation, clustered nodular opacities within the left upper lobe peripherally on image 28/66 series 6, measuring up to 4 mm. Linear opacity right lung base. Central airways are grossly patent. No pneumothorax.  Multilevel degenerative changes.  No acute osseous finding.  Review of the MIP images confirms the above findings.  IMPRESSION: No pulmonary embolism identified.  Mild clustered nodular opacity left upper lobe is favored to be post infectious/inflammatory.  Prominence of the main pulmonary artery may reflect pulmonary arterial hypertension.  Enlarged thyroid gland with nonspecific rounded calcification along the isthmus. Consider a nonemergent ultrasound follow-up.  Indeterminate 2.1 cm right adrenal nodule. Recommend adrenal protocol CT or MRI.   Electronically Signed   By: Jearld Lesch M.D.   On: 10/26/2013 23:12    EKG Interpretation    Date/Time:  Tuesday October 26 2013 19:51:09 EST Ventricular Rate:  62 PR Interval:  158 QRS Duration: 80 QT Interval:  396 QTC Calculation: 401 R Axis:   59 Text Interpretation:  Normal sinus rhythm Septal infarct , age undetermined Abnormal ECG No significant change was found Confirmed by Manus Gunning  MD, Adeliz Tonkinson ([id number]) on 10/26/2013 9:38:46 PM            MDM   1. DOE (dyspnea on exertion)   2. Chest pain   3. Aortic stenosis, mild   4. OSA (obstructive sleep apnea)    Dyspnea on exertion with chest tightness.  No symptoms currently.  EKG unchanged. Hx OSA on CPAP . CXR negative.  CT head obtained as patient had "Numbness of whole face" today for several hours which is now resolved.  No focal deficits.  Mild elevation of BNP, troponin negative. Does not appear to be in heart failure.  No PE on CT.  Thyroid and adrenal nodule will need followup. On ambulation, patient became dyspneic with chest tightness. Symptoms concerning for angina, possibly symptomatic AS. Will admit for cardiac evaluation and observation. Chest pain free on admission.  Glynn Octave, MD 10/27/13  0959 

## 2013-10-26 NOTE — Telephone Encounter (Signed)
Pt returning call

## 2013-10-26 NOTE — Telephone Encounter (Signed)
Kim from [place] office called to advise of a call from [facility] reporting a critical D-dimer at 0.89. PCP out of the office and critical lab reported to Dr. Posey Rea who advised pt to start ASA 325mg  QD, Lasix 40 mg QD, follow up tomorrow with Dr. Yetta Barre or ER if needed. Pt notified and would like message routed to PCP for am advisment.

## 2013-10-26 NOTE — Assessment & Plan Note (Signed)
It sounds like she is becoming symptomatic so I have asked her to see cardiology

## 2013-10-26 NOTE — ED Notes (Addendum)
Pt reports generalized weakness, SOB with ambulation and intermittent chest tightness x 3 days. Pt also reports intermittent nausea and 1 episode of diarrhea this evening. Pt denies pain at this time, including CP. Reports mild SOB. Told by PCP that her D Dimer was elevated. Pt also states "my whole face went numb too, but it's getting better." Sensation equal on both sides.  Neuro intact. Family at bedside.

## 2013-10-26 NOTE — Telephone Encounter (Signed)
Pt advised. Jennifer Castillo, CMA  

## 2013-10-27 ENCOUNTER — Encounter (HOSPITAL_COMMUNITY): Payer: Self-pay | Admitting: Internal Medicine

## 2013-10-27 DIAGNOSIS — I359 Nonrheumatic aortic valve disorder, unspecified: Secondary | ICD-10-CM

## 2013-10-27 DIAGNOSIS — R079 Chest pain, unspecified: Secondary | ICD-10-CM

## 2013-10-27 DIAGNOSIS — R0602 Shortness of breath: Secondary | ICD-10-CM | POA: Diagnosis present

## 2013-10-27 DIAGNOSIS — E279 Disorder of adrenal gland, unspecified: Secondary | ICD-10-CM

## 2013-10-27 DIAGNOSIS — E042 Nontoxic multinodular goiter: Secondary | ICD-10-CM

## 2013-10-27 DIAGNOSIS — I200 Unstable angina: Secondary | ICD-10-CM

## 2013-10-27 DIAGNOSIS — G4733 Obstructive sleep apnea (adult) (pediatric): Secondary | ICD-10-CM

## 2013-10-27 DIAGNOSIS — R0609 Other forms of dyspnea: Secondary | ICD-10-CM

## 2013-10-27 DIAGNOSIS — R0789 Other chest pain: Secondary | ICD-10-CM

## 2013-10-27 LAB — CBC WITH DIFFERENTIAL/PLATELET
Basophils Relative: 0 % (ref 0–1)
Eosinophils Absolute: 0.3 10*3/uL (ref 0.0–0.7)
Eosinophils Relative: 3 % (ref 0–5)
HCT: 39.1 % (ref 36.0–46.0)
Lymphocytes Relative: 34 % (ref 12–46)
Lymphs Abs: 2.6 10*3/uL (ref 0.7–4.0)
MCHC: 32.2 g/dL (ref 30.0–36.0)
MCV: 94.9 fL (ref 78.0–100.0)
Monocytes Absolute: 0.6 10*3/uL (ref 0.1–1.0)
Neutrophils Relative %: 54 % (ref 43–77)
RBC: 4.12 MIL/uL (ref 3.87–5.11)
WBC: 7.5 10*3/uL (ref 4.0–10.5)

## 2013-10-27 LAB — TROPONIN I: Troponin I: <0.3 ng/mL (ref <0.30)

## 2013-10-27 LAB — POCT I-STAT 3, ART BLOOD GAS (G3+)
Acid-Base Excess: 7 mmol/L — ABNORMAL HIGH (ref 0.0–2.0)
Bicarbonate: 31.9 mEq/L — ABNORMAL HIGH (ref 20.0–24.0)
Patient temperature: 98
TCO2: 33 mmol/L (ref 0–100)
pCO2 arterial: 43.2 mmHg (ref 35.0–45.0)
pH, Arterial: 7.475 — ABNORMAL HIGH (ref 7.350–7.450)

## 2013-10-27 LAB — COMPREHENSIVE METABOLIC PANEL
Albumin: 3.4 g/dL — ABNORMAL LOW (ref 3.5–5.2)
BUN: 8 mg/dL (ref 6–23)
Creatinine, Ser: 0.55 mg/dL (ref 0.50–1.10)
GFR calc Af Amer: >90 mL/min (ref >90)
GFR calc non Af Amer: 88 mL/min — ABNORMAL LOW (ref >90)
Potassium: 3.5 mEq/L (ref 3.5–5.1)
Total Protein: 6.4 g/dL (ref 6.0–8.3)

## 2013-10-27 MED ORDER — ISOSORB DINITRATE-HYDRALAZINE 20-37.5 MG PO TABS
0.5000 | ORAL_TABLET | Freq: Two times a day (BID) | ORAL | Status: DC
  Administered 2013-10-27 – 2013-10-28 (×4): 0.5 via ORAL
  Filled 2013-10-27 (×5): qty 0.5

## 2013-10-27 MED ORDER — ALPRAZOLAM 0.5 MG PO TABS
1.0000 mg | ORAL_TABLET | Freq: Three times a day (TID) | ORAL | Status: DC | PRN
  Administered 2013-10-27 – 2013-10-29 (×6): 1 mg via ORAL
  Filled 2013-10-27 (×6): qty 2

## 2013-10-27 MED ORDER — SODIUM CHLORIDE 0.9 % IV SOLN
INTRAVENOUS | Status: DC
  Administered 2013-10-28: 05:00:00 via INTRAVENOUS

## 2013-10-27 MED ORDER — TRAZODONE HCL 100 MG PO TABS
200.0000 mg | ORAL_TABLET | Freq: Every day | ORAL | Status: DC
  Administered 2013-10-27 – 2013-10-28 (×2): 200 mg via ORAL
  Filled 2013-10-27 (×3): qty 2

## 2013-10-27 MED ORDER — NEBIVOLOL HCL 5 MG PO TABS
5.0000 mg | ORAL_TABLET | Freq: Every day | ORAL | Status: DC
  Administered 2013-10-27 – 2013-10-29 (×3): 5 mg via ORAL
  Filled 2013-10-27 (×3): qty 1

## 2013-10-27 MED ORDER — ONDANSETRON HCL 4 MG PO TABS: 4.0000 mg | ORAL_TABLET | Freq: Four times a day (QID) | ORAL | Status: DC | PRN

## 2013-10-27 MED ORDER — ENALAPRIL-HYDROCHLOROTHIAZIDE 5-12.5 MG PO TABS: 2.0000 | ORAL_TABLET | Freq: Every day | ORAL | Status: DC

## 2013-10-27 MED ORDER — SODIUM CHLORIDE 0.9 % IJ SOLN
3.0000 mL | Freq: Two times a day (BID) | INTRAMUSCULAR | Status: DC
  Administered 2013-10-27: 3 mL via INTRAVENOUS

## 2013-10-27 MED ORDER — ASPIRIN 81 MG PO CHEW
81.0000 mg | CHEWABLE_TABLET | Freq: Every day | ORAL | Status: DC
  Administered 2013-10-27 – 2013-10-29 (×3): 81 mg via ORAL
  Filled 2013-10-27 (×3): qty 1

## 2013-10-27 MED ORDER — SODIUM CHLORIDE 0.9 % IJ SOLN: 3.0000 mL | INTRAMUSCULAR | Status: DC | PRN

## 2013-10-27 MED ORDER — HYDROCHLOROTHIAZIDE 25 MG PO TABS
25.0000 mg | ORAL_TABLET | Freq: Every day | ORAL | Status: DC
  Administered 2013-10-27 – 2013-10-28 (×2): 25 mg via ORAL
  Filled 2013-10-27 (×2): qty 1

## 2013-10-27 MED ORDER — ONDANSETRON HCL 4 MG/2ML IJ SOLN: 4.0000 mg | Freq: Four times a day (QID) | INTRAMUSCULAR | Status: DC | PRN

## 2013-10-27 MED ORDER — ACETAMINOPHEN 650 MG RE SUPP: 650.0000 mg | Freq: Four times a day (QID) | RECTAL | Status: DC | PRN

## 2013-10-27 MED ORDER — ENALAPRIL MALEATE 10 MG PO TABS
10.0000 mg | ORAL_TABLET | Freq: Every day | ORAL | Status: DC
  Administered 2013-10-27 – 2013-10-29 (×3): 10 mg via ORAL
  Filled 2013-10-27 (×3): qty 1

## 2013-10-27 MED ORDER — ASPIRIN 81 MG PO CHEW
81.0000 mg | CHEWABLE_TABLET | ORAL | Status: AC
  Administered 2013-10-28: 81 mg via ORAL
  Filled 2013-10-27: qty 1

## 2013-10-27 MED ORDER — SODIUM CHLORIDE 0.9 % IJ SOLN
3.0000 mL | Freq: Two times a day (BID) | INTRAMUSCULAR | Status: DC
  Administered 2013-10-27 – 2013-10-28 (×2): 3 mL via INTRAVENOUS

## 2013-10-27 MED ORDER — LEVOTHYROXINE SODIUM 25 MCG PO TABS
25.0000 ug | ORAL_TABLET | Freq: Every day | ORAL | Status: DC
  Filled 2013-10-27: qty 1

## 2013-10-27 MED ORDER — ACETAMINOPHEN 325 MG PO TABS
650.0000 mg | ORAL_TABLET | Freq: Four times a day (QID) | ORAL | Status: DC | PRN
  Administered 2013-10-27 – 2013-10-28 (×2): 650 mg via ORAL
  Filled 2013-10-27 (×2): qty 2

## 2013-10-27 MED ORDER — SODIUM CHLORIDE 0.9 % IV SOLN: 250.0000 mL | INTRAVENOUS | Status: DC | PRN

## 2013-10-27 MED ORDER — NITROGLYCERIN 0.4 MG SL SUBL
SUBLINGUAL_TABLET | SUBLINGUAL | Status: AC
  Administered 2013-10-27 (×3): 0.4 mg
  Filled 2013-10-27: qty 25

## 2013-10-27 MED ORDER — LEVOTHYROXINE SODIUM 75 MCG PO TABS
75.0000 ug | ORAL_TABLET | Freq: Every day | ORAL | Status: DC
  Filled 2013-10-27: qty 1

## 2013-10-27 MED ORDER — HYDROCODONE-ACETAMINOPHEN 10-325 MG PO TABS
1.0000 | ORAL_TABLET | Freq: Three times a day (TID) | ORAL | Status: DC | PRN
  Administered 2013-10-27 – 2013-10-29 (×7): 1 via ORAL
  Filled 2013-10-27 (×7): qty 1

## 2013-10-27 MED ORDER — SODIUM CHLORIDE 0.9 % IJ SOLN
3.0000 mL | Freq: Two times a day (BID) | INTRAMUSCULAR | Status: DC
  Administered 2013-10-29: 3 mL via INTRAVENOUS

## 2013-10-27 MED ORDER — POTASSIUM CHLORIDE CRYS ER 20 MEQ PO TBCR
20.0000 meq | EXTENDED_RELEASE_TABLET | Freq: Two times a day (BID) | ORAL | Status: DC
  Administered 2013-10-27 – 2013-10-29 (×6): 20 meq via ORAL
  Filled 2013-10-27 (×7): qty 1

## 2013-10-27 MED ORDER — INFLUENZA VAC SPLIT QUAD 0.5 ML IM SUSP
0.5000 mL | INTRAMUSCULAR | Status: AC
  Administered 2013-10-28: 0.5 mL via INTRAMUSCULAR
  Filled 2013-10-27: qty 0.5

## 2013-10-27 MED ORDER — MECLIZINE HCL 25 MG PO TABS
25.0000 mg | ORAL_TABLET | Freq: Three times a day (TID) | ORAL | Status: DC | PRN
  Filled 2013-10-27: qty 1

## 2013-10-27 MED ORDER — FUROSEMIDE 40 MG PO TABS
40.0000 mg | ORAL_TABLET | Freq: Every day | ORAL | Status: DC
  Administered 2013-10-27 – 2013-10-29 (×3): 40 mg via ORAL
  Filled 2013-10-27 (×3): qty 1

## 2013-10-27 MED ORDER — ENOXAPARIN SODIUM 40 MG/0.4ML ~~LOC~~ SOLN
40.0000 mg | SUBCUTANEOUS | Status: DC
  Administered 2013-10-27: 40 mg via SUBCUTANEOUS
  Filled 2013-10-27 (×2): qty 0.4

## 2013-10-27 NOTE — Progress Notes (Signed)
Patient seen and examined. Admitted after midnight secondary to SOB and chest tightness. Patient feeling better and reporting improve in her breathing and chest discomfort. CE'z neg X2 and no EKG or telemetry changes. Please referred to  Dr. Katherene Ponto H&P for further info/details on admission.  Plan: -cycle cardiac enzymes -cardiology has been consulted and will follow recommendations -continue lasix; believe symptoms secondary to diastolic heart failure and pulmonary HTN -will add bidil for better BP control and help pulmonary HTN as well -CT negative for PE -TSH elevated, appointment set up with Dr. Elvera Lennox (endocrinology) for next Thursday to follow thyroid abnormalities (nodules and calcifications) and also adrenal nodule. -will follow clinical response -will check Free T4   Derek Laughter [phone]

## 2013-10-27 NOTE — Progress Notes (Signed)
The patient arrived to 4E12 from the ED at 0100.  She was oriented to the unit and placed on telemetry.  VS were taken and the patient was assessed.  She is A&Ox4 and does not have any complaints of pain at this time.  The call bell was placed within reach.  The patient's daughter is at the bedside.

## 2013-10-27 NOTE — Progress Notes (Signed)
Pt a/o, c/o HA PRN norco given as ordered, vss, pt stable, will continue to monitor

## 2013-10-27 NOTE — Progress Notes (Signed)
At 0430, the patient awoke with 8/10 pressure-like chest pain.  Nitro was given x3 with complete relief.  VS were taken.  An EKG was obtained and placed in the patient's chart.  Dr. Toniann Fail was notified.

## 2013-10-27 NOTE — Consult Note (Signed)
CARDIOLOGY CONSULT NOTE   Patient ID: [name] MRN: [id number], DOB/AGE: 1933/07/14   Admit date: 10/26/2013 Date of Consult: 10/27/2013  Primary Physician: Sanda Linger, MD Primary Cardiologist: new to  - seen by P. Eden Emms, MD   Pt. Profile  79 y/o female w/o prior cardiac hx who presented to the ED with progressive dyspnea.  Problem List  Past Medical History  Diagnosis Date  . Hyperlipidemia   . Hypertension   . Osteoarthritis   . Wears dentures   . Bruises easily   . Diarrhea   . Abdominal pain   . Wears glasses   . Shortness of breath     WITH EXERTION USES 2 L O2 BEDTIME  . Oxygen desaturation during sleep     USES 2 LITERS BEDTIME   VIA CPAP 06/2012 [facility]   . Anxiety   . Depression   . Sleep apnea     CPAP WITH O2 2 LITERS 2013 ([facility])  . Diabetes mellitus     type II  DIET CONTROLLED  . Chronic headaches     HISTORY OF   . Anemia     NOS  . Hepatitis     AGE 30S  . Chronic diastolic CHF (congestive heart failure)     a. 06/2013 EF 65-70%.  . Mild aortic stenosis     a. 06/2013 Echo: EF 65-70%, mod LVH with focal basal hypertrophy, very mild AS.    Past Surgical History  Procedure Laterality Date  . Appendectomy    . Cholecystectomy    . Abdominal hysterectomy    . Tonsillectomy    . Revision total knee arthroplasty  2011  . Rotator cuff repair  2010  . Tonsillectomy    . Joint replacement      LEFT KNEE   . Knee arthroscopy      RIGHT  . Shoulder arthroscopy  09/17/2012    Procedure: ARTHROSCOPY SHOULDER;  Surgeon: Kennieth Rad, MD;  Location: [facility] OR;  Service: Orthopedics;  Laterality: Left;    Allergies  Allergies  Allergen Reactions  . Amlodipine     dizziness  . Metformin Diarrhea    Diarrhea, IBS   HPI   79 y/o female with a prior h/o htn, hl, and diet controlled diabetes.  She has no prior cardiac hx.  In August of this year, she was noted to have a systolic murmur and an echocardiogram was performed and showed nl LV  fxn with very mild AS.  She did well throughout the remainder of summer and early fall but began to note progressive doe about 1-2 mos ago associated with reduced exercise tolerance and easy fatigability.  Over the past few wks, this has progressed to the point of occurring with minimal activity (cleaning in her kitchen or preparing a meal) and has also become associated with substernal chest heaviness.  She has never experienced sscp @ rest but now it seems to occur with any activity, assoc with dyspnea.  Over the weekend, she began to also note bilat UE/LE edema.  She was seen by her PCP yesterday and she was Rx lasix and cardiology referral was made.  Labs were also drawn and d-dimer returned elevated @ 0.89.  She was contacted and made aware of the d dimer result and advised that if there were any change in her status, that she should present to the ED.  With persistent DOE, she decided to come into the ED last night.  CTA  of the chest was performed and negative for PE with incidental finding of thyromegaly and right adrenal nodule.  She was admitted by IM and maintained on oral lasix.  CE have remained negative and we've been asked to eval. Currently she is w/o c/p or dyspnea.  Inpatient Medications  . aspirin  81 mg Oral Daily  . enalapril  10 mg Oral Daily  . enoxaparin (LOVENOX) injection  40 mg Subcutaneous Q24H  . furosemide  40 mg Oral Daily  . hydrochlorothiazide  25 mg Oral Daily  . [START ON 10/28/2013] influenza vac split quadrivalent PF  0.5 mL Intramuscular Tomorrow-1000  . isosorbide-hydrALAZINE  0.5 tablet Oral BID  . nebivolol  5 mg Oral Daily  . potassium chloride SA  20 mEq Oral BID  . sodium chloride  3 mL Intravenous Q12H  . traZODone  200 mg Oral QHS   Family History Family History  Problem Relation Age of Onset  . Alcohol abuse Other   . Hypertension Other   . Kidney disease Other   . Mental illness Other   . Cancer Father     prostate cancer  . Heart disease Sister    . Heart disease Brother   . Emphysema Brother     Social History History   Social History  . Marital Status: Widowed    Spouse Name: N/A    Number of Children: 5  . Years of Education: N/A   Occupational History  . retired      Administrator, sports at [employer]   Social History Main Topics  . Smoking status: Never Smoker   . Smokeless tobacco: Never Used  . Alcohol Use: No  . Drug Use: No  . Sexual Activity: Not Currently   Other Topics Concern  . Not on file   Social History Narrative  . No narrative on file    Review of Systems  General:  No chills, fever, night sweats or weight changes.  Cardiovascular:  +++ exertional chest pain and dyspnea.  +++ ue/le edema yesterday, which has not resolved.  No orthopnea, palpitations, paroxysmal nocturnal dyspnea. Dermatological: No rash, lesions/masses Respiratory: No cough, +++ dyspnea Urologic: No hematuria, dysuria Abdominal:   No nausea, vomiting, diarrhea, bright red blood per rectum, melena, or hematemesis Neurologic:  No visual changes, wkns, changes in mental status. All other systems reviewed and are otherwise negative except as noted above.  Physical Exam  Blood pressure 133/50, pulse 50, temperature 98 F (36.7 C), temperature source Oral, resp. rate 20, height 5\' 2"  (1.575 m), weight 214 lb 8.1 oz (97.3 kg), SpO2 96.00%.  General: Pleasant, NAD Psych: Normal affect. Neuro: Alert and oriented X 3. Moves all extremities spontaneously. HEENT: Normal  Neck: Supple, obese, difficult to assess jvp.  No bruits. Lungs:  Resp regular and unlabored, bibasilar crackles. Heart: RRR no s3, s4, 2/6 SEM rusb. Abdomen: Soft, non-tender, non-distended, BS + x 4.  Extremities: No clubbing, cyanosis or edema. DP/PT/Radials 2+ and equal bilaterally.  Labs   Recent Labs  10/26/13 0907 10/27/13 0245 10/27/13 0515  TROPONINI <0.01 <0.30 <0.30   Lab Results  Component Value Date   WBC 7.5 10/27/2013   HGB 12.6 10/27/2013   HCT 39.1  10/27/2013   MCV 94.9 10/27/2013   PLT 128* 10/27/2013    Recent Labs Lab 10/27/13 0245  NA 139  K 3.5  CL 100  CO2 29  BUN 8  CREATININE 0.55  CALCIUM 9.3  PROT 6.4  BILITOT 0.8  ALKPHOS  60  ALT 12  AST 19  GLUCOSE 105*   Lab Results  Component Value Date   DDIMER 0.89* 10/26/2013   Radiology/Studies  Dg Chest 2 View  10/26/2013   CLINICAL DATA:  Shortness of breath and chest tightness.  EXAM: CHEST - 2 VIEW  COMPARISON:  09/09/2012  FINDINGS: The heart size is at the upper limits of normal. There is stable tortuosity of the thoracic aorta. There is no evidence of pulmonary edema, consolidation, pneumothorax, nodule or pleural fluid. The visualized bony thorax is unremarkable.  IMPRESSION: No active disease.   Electronically Signed   By: Irish Lack M.D.   On: 10/26/2013 20:34   Ct Head Wo Contrast  10/26/2013   CLINICAL DATA:  *Elevated D-dimer, edema.  EXAM: CT HEAD WITHOUT CONTRAST  IMPRESSION: Mild to moderate white matter changes as above. No CT evidence of an acute intracranial abnormality.   Electronically Signed   By: Jearld Lesch M.D.   On: 10/26/2013 22:58   Ct Angio Chest Pe W/cm &/or Wo Cm  10/26/2013   CLINICAL DATA:  Shortness of breath, elevated D-dimer  EXAM: CT ANGIOGRAPHY CHEST WITH CONTRAST   IMPRESSION: No pulmonary embolism identified.  Mild clustered nodular opacity left upper lobe is favored to be post infectious/inflammatory.  Prominence of the main pulmonary artery may reflect pulmonary arterial hypertension.  Enlarged thyroid gland with nonspecific rounded calcification along the isthmus. Consider a nonemergent ultrasound follow-up.  Indeterminate 2.1 cm right adrenal nodule. Recommend adrenal protocol CT or MRI.   Electronically Signed   By: Jearld Lesch M.D.   On: 10/26/2013 23:12   ECG  Rsr, 62, poss old septal infarct.  No acute st/t changes.  ASSESSMENT AND PLAN  1. [place]: pt presents with a several wk h/o progressive doe and also  exertional chest pressure/heaviness.  CE negative.  No acute st/t changes on ECG.  Ss very concerning for angina/CAD.  Discussed with patient and family members and have recommended cardiac catheterization to define her coronary anatomy.  The patient understands that risks include but are not limited to stroke (1 in 1000), death (1 in 1000), kidney failure [usually temporary] (1 in 500), bleeding (1 in 200), allergic reaction [possibly serious] (1 in 200), and agrees to proceed.  Cont asa, bb, acei.  Will add mod dose statin (LDL only 85 in January 2014).  2.  Acute CHF:  Presumably diastolic in setting of previously nl EF by echo in 06/2013.  Doubt that AS (prev very mild) is playing a significant role.  She does have crackles @ bilat bases but otw volume looks ok.  Cont current oral diuretic regimen.  Repeat echo to assess LV fxn.   3.  HTN:  Stable.  4.  DM:  Diet controlled.  5.  Thyromegaly:  TSH mildly elevated.  Free T4 pending.  Per IM.  6.  Adrenal nodule: per IM.  Signed, Nicolasa Ducking, NP 10/27/2013, 3:04 PM  Patient examined chart reviewed Discussed care plan with patient and daughters. Worrisome for accelerating angina.  Right and left cath in am Risks discussed Patient Willing to proceed.  Exam remarkable for obesity  Aortic stenosis should not be  Clinically relevant being so mild  Charlton Haws

## 2013-10-27 NOTE — H&P (Signed)
[facility] History and Physical  [name] ZOX:[id number] DOB: 1933/06/13 DOA: 10/26/2013  Referring physician: ER physician. PCP: Sanda Linger, MD   Chief Complaint: Shortness of breath.  HPI: [name] is a 79 y.o. female history of OSA and hypertension has been experiencing increasing shortness of breath over the last 3-4 days. Shortness of breath is present at rest also but increase in exertion. At the same time patient has been having some chest tightness. Patient has had a 2-D echo in August of this year which showed EF of 65-70% with mild aortic stenosis. Patient states that she also noticed that she had mild edema of the lower extremities and hand. Her primary care was planning to refer her cardiologist and had prescribed her Lasix. Labs done at the office showed mildly elevated d-dimer. Patient presented to the ER and had a CT angiogram of the chest which was negative for PE but showed lung nodules which may be post infectious or inflammatory. Patient had taken Lasix at home and had good diuresis at this time her shortness of breath is better and has been admitted for further management.   Review of Systems: As presented in the history of presenting illness, rest negative.  Past Medical History  Diagnosis Date  . Hyperlipidemia   . Hypertension   . Osteoarthritis   . Wears dentures   . Bruises easily   . Diarrhea   . Abdominal pain   . Wears glasses   . Shortness of breath     WITH EXERTION USES 2 LO2 BEDTIME  . Oxygen desaturation during sleep     USES 2 LITERS BEDTIME   VIA CPAP 06/2012 [facility]   . Anxiety   . Depression   . Sleep apnea     CPAP WITH O2 2 LITERS 2013 ([facility])  . Diabetes mellitus     type II  DIET CONTROLLED  . Chronic headaches     HISTORY OF   . Anemia     NOS  . Hepatitis     AGE 30S   Past Surgical History  Procedure Laterality Date  . Appendectomy    . Cholecystectomy    . Abdominal hysterectomy    . Tonsillectomy    .  Revision total knee arthroplasty  2011  . Rotator cuff repair  2010  . Tonsillectomy    . Joint replacement      LEFT KNEE   . Knee arthroscopy      RIGHT  . Shoulder arthroscopy  09/17/2012    Procedure: ARTHROSCOPY SHOULDER;  Surgeon: Kennieth Rad, MD;  Location: [facility] OR;  Service: Orthopedics;  Laterality: Left;   Social History:  reports that she has never smoked. She has never used smokeless tobacco. She reports that she does not drink alcohol or use illicit drugs. Where does patient live home. Can patient participate in ADLs? Yes.  Allergies  Allergen Reactions  . Amlodipine     dizziness  . Metformin Diarrhea    Diarrhea, IBS    Family History:  Family History  Problem Relation Age of Onset  . Alcohol abuse Other   . Hypertension Other   . Kidney disease Other   . Mental illness Other   . Cancer Father     prostate cancer  . Heart disease Sister   . Heart disease Brother   . Emphysema Brother       Prior to Admission medications   Medication Sig Start Date End Date Taking?  Authorizing Provider  ALPRAZolam Prudy Feeler) 1 MG tablet Take 1 mg by mouth 3 (three) times daily as needed for anxiety or sleep.   Yes Historical Provider, MD  Cholecalciferol (VITAMIN D3) 1000 UNITS CAPS Take 2,000 Units by mouth daily.    Yes Historical Provider, MD  Enalapril-Hydrochlorothiazide 5-12.5 MG per tablet Take 2 tablets by mouth daily.   Yes Historical Provider, MD  furosemide (LASIX) 40 MG tablet Take 1 tablet (40 mg total) by mouth daily. 10/26/13  Yes Tresa Garter, MD  HYDROcodone-acetaminophen (NORCO) 10-325 MG per tablet Take 1 tablet by mouth every 8 (eight) hours as needed for pain. 07/29/13  Yes Etta Grandchild, MD  meclizine (ANTIVERT) 25 MG tablet Take 25 mg by mouth 3 (three) times daily as needed for dizziness.   Yes Historical Provider, MD  nebivolol (BYSTOLIC) 5 MG tablet Take 1 tablet (5 mg total) by mouth daily. 07/14/13  Yes Etta Grandchild, MD  potassium chloride SA  (K-DUR,KLOR-CON) 20 MEQ tablet Take 1 tablet (20 mEq total) by mouth 2 (two) times daily. 07/14/13  Yes Etta Grandchild, MD  traZODone (DESYREL) 100 MG tablet Take 2 tablets (200 mg total) by mouth at bedtime. 10/26/13   Etta Grandchild, MD    Physical Exam: Filed Vitals:   10/26/13 2300 10/26/13 2318 10/26/13 2330 10/27/13 0100  BP: 151/64  175/66 144/65  Pulse: 62  57 56  Temp:    97.7 F (36.5 C)  TempSrc:    Oral  Resp: 11  15 20   Height:    5\' 2"  (1.575 m)  Weight:    97.3 kg (214 lb 8.1 oz)  SpO2: 96% 98% 96% 95%     General:  Well-developed well-nourished.  Eyes: Anicteric no pallor.  ENT: No discharge from ears eyes nose mouth.  Neck: No mass felt.  Cardiovascular: S1-S2 heard.  Respiratory: No rhonchi or crepitations.  Abdomen: Soft nontender bowel sounds present.  Skin: No rash.  Musculoskeletal: No edema.  Psychiatric: Appears normal.  Neurologic: Alert awake oriented to time place and person. Moves all extremities.  Labs on Admission:  Basic Metabolic Panel:  Recent Labs Lab 10/26/13 0907 10/26/13 2024  NA 142 141  K 4.1 3.4*  CL 104 101  CO2 34*  --   GLUCOSE 102* 110*  BUN 11 10  CREATININE 0.6 0.90  CALCIUM 9.3  --   MG 1.6  --    Liver Function Tests: No results found for this basename: AST, ALT, ALKPHOS, BILITOT, PROT, ALBUMIN,  in the last 168 hours No results found for this basename: LIPASE, AMYLASE,  in the last 168 hours No results found for this basename: AMMONIA,  in the last 168 hours CBC:  Recent Labs Lab 10/26/13 0907 10/26/13 2024  WBC 6.3  --   NEUTROABS 3.4  --   HGB 12.6 14.3  HCT 37.8 42.0  MCV 91.7  --   PLT 130.0*  --    Cardiac Enzymes:  Recent Labs Lab 10/26/13 0907  TROPONINI <0.01    BNP (last 3 results)  Recent Labs  10/26/13 0907 10/26/13 2015  PROBNP 183.0* 538.7*   CBG: No results found for this basename: GLUCAP,  in the last 168 hours  Radiological Exams on Admission: Dg Chest 2  View  10/26/2013   CLINICAL DATA:  Shortness of breath and chest tightness.  EXAM: CHEST - 2 VIEW  COMPARISON:  09/09/2012  FINDINGS: The heart size is at the upper limits of  normal. There is stable tortuosity of the thoracic aorta. There is no evidence of pulmonary edema, consolidation, pneumothorax, nodule or pleural fluid. The visualized bony thorax is unremarkable.  IMPRESSION: No active disease.   Electronically Signed   By: Irish Lack M.D.   On: 10/26/2013 20:34   Ct Head Wo Contrast  10/26/2013   CLINICAL DATA:  *Elevated D-dimer, edema.  EXAM: CT HEAD WITHOUT CONTRAST  TECHNIQUE: Contiguous axial images were obtained from the base of the skull through the vertex without intravenous contrast.  COMPARISON:  None.  FINDINGS: Mild-to-moderate subcortical and periventricular white matter hypodensities, a nonspecific finding often seen in the setting of chronic microangiopathic change. No definite CT evidence of an acute infarction. No intraparenchymal hemorrhage, mass, mass, or abnormal extra-axial fluid collection. The visualized paranasal sinuses and mastoid air cells are predominantly clear.  IMPRESSION: Mild to moderate white matter changes as above. No CT evidence of an acute intracranial abnormality.   Electronically Signed   By: Jearld Lesch M.D.   On: 10/26/2013 22:58   Ct Angio Chest Pe W/cm &/or Wo Cm  10/26/2013   CLINICAL DATA:  Shortness of breath, elevated D-dimer  EXAM: CT ANGIOGRAPHY CHEST WITH CONTRAST  TECHNIQUE: Multidetector CT imaging of the chest was performed using the standard protocol during bolus administration of intravenous contrast. Multiplanar CT image reconstructions including MIPs were obtained to evaluate the vascular anatomy.  CONTRAST:  80mL OMNIPAQUE IOHEXOL 350 MG/ML SOLN  COMPARISON:  10/26/2013 chest radiograph, 04/05/2013 abdominal pelvic CT  FINDINGS: No pulmonary arterial branch filling defect. The main pulmonary artery measures 3.6 cm in transverse  diameter.  Enlarged thyroid gland. Rounded calcification within the isthmic region. Normal caliber aorta with scattered atherosclerotic disease.  Heart size upper normal to mildly enlarged. Coronary artery and aortic valvular calcifications.  No pleural or pericardial effusion. No intrathoracic lymphadenopathy.  Limited upper abdominal images show an indeterminate right adrenal nodule measuring 2.1 cm.  Lung evaluation suboptimal in the expiratory phase and with respiratory motion. Within this limitation, clustered nodular opacities within the left upper lobe peripherally on image 28/66 series 6, measuring up to 4 mm. Linear opacity right lung base. Central airways are grossly patent. No pneumothorax.  Multilevel degenerative changes.  No acute osseous finding.  Review of the MIP images confirms the above findings.  IMPRESSION: No pulmonary embolism identified.  Mild clustered nodular opacity left upper lobe is favored to be post infectious/inflammatory.  Prominence of the main pulmonary artery may reflect pulmonary arterial hypertension.  Enlarged thyroid gland with nonspecific rounded calcification along the isthmus. Consider a nonemergent ultrasound follow-up.  Indeterminate 2.1 cm right adrenal nodule. Recommend adrenal protocol CT or MRI.   Electronically Signed   By: Jearld Lesch M.D.   On: 10/26/2013 23:12    EKG: Independently reviewed. Normal sinus rhythm with poor R-wave progression in V1 and V2.  Assessment/Plan Principal Problem:   DOE (dyspnea on exertion) Active Problems:   OSA (obstructive sleep apnea)   Aortic stenosis, mild   Chest pain   SOB (shortness of breath)   1. Shortness of breath with chest pressure - cause is not clear. Continue with Lasix 40 mg by mouth daily for now. There was concern if patient's symptoms were secondary to aortic stenosis seen in the 2-D echo in August. [facility] consult cardiology in a.m. Closely follow intake output and metabolic panel. Cycle cardiac  markers. CPAP per respiratory. 2. OSA - CPAP per respiratory. 3. Hypertension - continue home medications. 4. Lung nodule,  thyroid enlargement with nonspecific rounded calcifications and adrenal nodule - will need followup with primary care for further workup.    Code Status: Full code.  Family Communication: Family at the bedside.  Disposition Plan: Admit to inpatient.    Faron Whitelock N. [facility] Pager [phone].  If 7PM-7AM, please contact night-coverage [email or web address] Password [email or web address] 10/27/2013, 2:27 AM

## 2013-10-28 ENCOUNTER — Telehealth: Payer: Self-pay | Admitting: Pulmonary Disease

## 2013-10-28 ENCOUNTER — Encounter (HOSPITAL_COMMUNITY)
Admission: EM | Disposition: A | Discharge status: Home / Self Care | Payer: Self-pay | Source: Home / Self Care | Attending: Internal Medicine

## 2013-10-28 DIAGNOSIS — F3289 Other specified depressive episodes: Secondary | ICD-10-CM

## 2013-10-28 DIAGNOSIS — R079 Chest pain, unspecified: Secondary | ICD-10-CM

## 2013-10-28 DIAGNOSIS — I517 Cardiomegaly: Secondary | ICD-10-CM

## 2013-10-28 DIAGNOSIS — I10 Essential (primary) hypertension: Secondary | ICD-10-CM

## 2013-10-28 DIAGNOSIS — F411 Generalized anxiety disorder: Secondary | ICD-10-CM

## 2013-10-28 DIAGNOSIS — R0602 Shortness of breath: Secondary | ICD-10-CM

## 2013-10-28 DIAGNOSIS — F329 Major depressive disorder, single episode, unspecified: Secondary | ICD-10-CM

## 2013-10-28 HISTORY — PX: LEFT AND RIGHT HEART CATHETERIZATION WITH CORONARY ANGIOGRAM: SHX5449

## 2013-10-28 LAB — POCT I-STAT 3, VENOUS BLOOD GAS (G3P V)
Acid-Base Excess: 2 mmol/L (ref 0.0–2.0)
Bicarbonate: 26.6 mEq/L — ABNORMAL HIGH (ref 20.0–24.0)
O2 Saturation: 66 %
TCO2: 28 mmol/L (ref 0–100)
TCO2: 28 mmol/L (ref 0–100)
pH, Ven: 7.406 — ABNORMAL HIGH (ref 7.250–7.300)
pO2, Ven: 33 mmHg (ref 30.0–45.0)

## 2013-10-28 LAB — POCT I-STAT 3, ART BLOOD GAS (G3+)
Acid-Base Excess: 2 mmol/L (ref 0.0–2.0)
O2 Saturation: 92 %
TCO2: 28 mmol/L (ref 0–100)
pCO2 arterial: 39.6 mmHg (ref 35.0–45.0)
pO2, Arterial: 62 mmHg — ABNORMAL LOW (ref 80.0–100.0)

## 2013-10-28 LAB — PROTIME-INR
INR: 0.97 (ref 0.00–1.49)
Prothrombin Time: 12.7 seconds (ref 11.6–15.2)

## 2013-10-28 LAB — T4, FREE: Free T4: 0.65 ng/dL — ABNORMAL LOW (ref 0.80–1.80)

## 2013-10-28 SURGERY — LEFT AND RIGHT HEART CATHETERIZATION WITH CORONARY ANGIOGRAM
Anesthesia: LOCAL

## 2013-10-28 MED ORDER — ENOXAPARIN SODIUM 40 MG/0.4ML ~~LOC~~ SOLN
40.0000 mg | SUBCUTANEOUS | Status: DC
  Administered 2013-10-29: 40 mg via SUBCUTANEOUS
  Filled 2013-10-28 (×2): qty 0.4

## 2013-10-28 MED ORDER — ONDANSETRON HCL 4 MG/2ML IJ SOLN: 4.0000 mg | Freq: Four times a day (QID) | INTRAMUSCULAR | Status: DC | PRN

## 2013-10-28 MED ORDER — ISOSORB DINITRATE-HYDRALAZINE 20-37.5 MG PO TABS
1.0000 | ORAL_TABLET | Freq: Two times a day (BID) | ORAL | Status: DC
  Administered 2013-10-29: 1 via ORAL
  Filled 2013-10-28 (×2): qty 1

## 2013-10-28 MED ORDER — SODIUM CHLORIDE 0.9 % IV SOLN: INTRAVENOUS | Status: AC

## 2013-10-28 MED ORDER — HEPARIN (PORCINE) IN NACL 2-0.9 UNIT/ML-% IJ SOLN
INTRAMUSCULAR | Status: AC
  Filled 2013-10-28: qty 1000

## 2013-10-28 MED ORDER — ACETAMINOPHEN 325 MG PO TABS: 650.0000 mg | ORAL_TABLET | ORAL | Status: DC | PRN

## 2013-10-28 MED ORDER — LIDOCAINE HCL (PF) 1 % IJ SOLN
INTRAMUSCULAR | Status: AC
  Filled 2013-10-28: qty 30

## 2013-10-28 MED ORDER — FENTANYL CITRATE 0.05 MG/ML IJ SOLN
INTRAMUSCULAR | Status: AC
  Filled 2013-10-28: qty 2

## 2013-10-28 MED ORDER — MIDAZOLAM HCL 2 MG/2ML IJ SOLN
INTRAMUSCULAR | Status: AC
  Filled 2013-10-28: qty 2

## 2013-10-28 MED ORDER — HYDRALAZINE HCL 20 MG/ML IJ SOLN
INTRAMUSCULAR | Status: AC
  Filled 2013-10-28: qty 1

## 2013-10-28 NOTE — CV Procedure (Signed)
Cardiac Cath Procedure Note  Indication: Dyspnea, progressive chest pain  Procedures performed:  1) Right heart cathererization 2) Selective coronary angiography 3) Left heart catheterization 4) Left ventriculogram  Description of procedure:     The risks and indication of the procedure were explained. Consent was signed and placed on the chart. An appropriate timeout was taken prior to the procedure. The right groin was prepped and draped in the routine sterile fashion and anesthetized with 1% local lidocaine.   A 5 FR arterial sheath was placed in the right femoral artery using a modified Seldinger technique. Standard catheters including a JL4, AL-1 and angled pigtail were used. All catheter exchanges were made over a wire. A 7 FR venous sheath was placed in the right femoral vein using a modified Seldinger technique. A standard Swan-Ganz catheter was used for the procedure.   Complications:  None apparent  Findings:  RA = 2 RV = 31/2/6 PA = 29/7 (18) PCW = 7 Fick cardiac output/index = 5.6/2.9 PVR = 2.0 WU FA sat = 92% PA sat = 65%, 66%  Ao Pressure: 173/79 )115) LV Pressure:  171/8/18 There was no signficant gradient across the aortic valve on pullback.  Left main: Normal  LAD: Wraps apex. Gives off single diagonal. Minimal plaque.  LCX: Gives off large branching ramus. Moderate-sized OM-1. Normal.  RCA: Anterior takeoff (cannualted with AL-1). Moderate sized dominant vessel. Normal/  LV-gram done in the RAO projection: Ejection fraction = 60% no regional wall motion abnormalities   Assessment: 1. No significant CAD 2. Normal LV function 3. No significant gradient across the aortic valve 4. Severe HTN (SBP > 210 in cath lab) 5. Well compensated hemodynamics  Plan/Discussion:  Her coronary arteries and LV function are normal as are her right heart pressures. Suspect dyspnea related to obesity and deconditioning. However may also be related to her HTN. Continue  risk factor modification. OK for discharge from cardiology perspective once BP controlled.   Arvilla Meres, MD 5:51 PM

## 2013-10-28 NOTE — Progress Notes (Signed)
Patient ID: [name], female   DOB: 10/17/1933, 79 y.o.   MRN: [id number]    Subjective:  Denies SSCP, palpitations or Dyspnea   Objective:  Filed Vitals:   10/27/13 2021 10/27/13 2132 10/28/13 0010 10/28/13 0441  BP: 133/68 123/75 104/51 125/65  Pulse: 63 59 60 61  Temp:  98.1 F (36.7 C) 98.4 F (36.9 C) 97.6 F (36.4 C)  TempSrc:  Oral Oral Oral  Resp:  16 16 16  Height:      Weight:    208 lb 6.4 oz (94.53 kg)  SpO2:  97% 95% 97%    Intake/Output from previous day:  Intake/Output Summary (Last 24 hours) at 10/28/13 0912 Last data filed at 10/28/13 0819  Gross per 24 hour  Intake    600 ml  Output   2100 ml  Net  -1500 ml    Physical Exam: Affect appropriate Obese black female  HEENT: normal Neck supple with no adenopathy JVP normal no bruits no thyromegaly Lungs clear with no wheezing and good diaphragmatic motion Heart:  S1/S2 no murmur, no rub, gallop or click PMI normal Abdomen: benighn, BS positve, no tenderness, no AAA no bruit.  No HSM or HJR Distal pulses intact with no bruits No edema Neuro non-focal Skin warm and dry No muscular weakness   Lab Results: Basic Metabolic Panel:  Recent Labs  10/26/13 0907 10/26/13 2024 10/27/13 0245  NA 142 141 139  K 4.1 3.4* 3.5  CL 104 101 100  CO2 34*  --  29  GLUCOSE 102* 110* 105*  BUN 11 10 8  CREATININE 0.6 0.90 0.55  CALCIUM 9.3  --  9.3  MG 1.6  --   --    Liver Function Tests:  Recent Labs  10/27/13 0245  AST 19  ALT 12  ALKPHOS 60  BILITOT 0.8  PROT 6.4  ALBUMIN 3.4*   CBC:  Recent Labs  10/26/13 0907 10/26/13 2024 10/27/13 0245  WBC 6.3  --  7.5  NEUTROABS 3.4  --  4.0  HGB 12.6 14.3 12.6  HCT 37.8 42.0 39.1  MCV 91.7  --  94.9  PLT 130.0*  --  128*   Cardiac Enzymes:  Recent Labs  10/27/13 0245 10/27/13 0515 10/27/13 1412  TROPONINI <0.30 <0.30 <0.30   D-Dimer:  Recent Labs  10/26/13 0907  DDIMER 0.89*  Thyroid Function Tests:  Recent Labs  10/27/13 0245  TSH 5.235*    Imaging: Dg Chest 2 View  10/26/2013   CLINICAL DATA:  Shortness of breath and chest tightness.  EXAM: CHEST - 2 VIEW  COMPARISON:  09/09/2012  FINDINGS: The heart size is at the upper limits of normal. There is stable tortuosity of the thoracic aorta. There is no evidence of pulmonary edema, consolidation, pneumothorax, nodule or pleural fluid. The visualized bony thorax is unremarkable.  IMPRESSION: No active disease.   Electronically Signed   By: Glenn  Yamagata M.D.   On: 10/26/2013 20:34   Ct Head Wo Contrast  10/26/2013   CLINICAL DATA:  *Elevated D-dimer, edema.  EXAM: CT HEAD WITHOUT CONTRAST  TECHNIQUE: Contiguous axial images were obtained from the base of the skull through the vertex without intravenous contrast.  COMPARISON:  None.  FINDINGS: Mild-to-moderate subcortical and periventricular white matter hypodensities, a nonspecific finding often seen in the setting of chronic microangiopathic change. No definite CT evidence of an acute infarction. No intraparenchymal hemorrhage, mass, mass, or abnormal extra-axial fluid collection. The visualized paranasal sinuses and   mastoid air cells are predominantly clear.  IMPRESSION: Mild to moderate white matter changes as above. No CT evidence of an acute intracranial abnormality.   Electronically Signed   By: Andrew  DelGaizo M.D.   On: 10/26/2013 22:58   Ct Angio Chest Pe W/cm &/or Wo Cm  10/26/2013   CLINICAL DATA:  Shortness of breath, elevated D-dimer  EXAM: CT ANGIOGRAPHY CHEST WITH CONTRAST  TECHNIQUE: Multidetector CT imaging of the chest was performed using the standard protocol during bolus administration of intravenous contrast. Multiplanar CT image reconstructions including MIPs were obtained to evaluate the vascular anatomy.  CONTRAST:  80mL OMNIPAQUE IOHEXOL 350 MG/ML SOLN  COMPARISON:  10/26/2013 chest radiograph, 04/05/2013 abdominal pelvic CT  FINDINGS: No pulmonary arterial branch filling defect. The  main pulmonary artery measures 3.6 cm in transverse diameter.  Enlarged thyroid gland. Rounded calcification within the isthmic region. Normal caliber aorta with scattered atherosclerotic disease.  Heart size upper normal to mildly enlarged. Coronary artery and aortic valvular calcifications.  No pleural or pericardial effusion. No intrathoracic lymphadenopathy.  Limited upper abdominal images show an indeterminate right adrenal nodule measuring 2.1 cm.  Lung evaluation suboptimal in the expiratory phase and with respiratory motion. Within this limitation, clustered nodular opacities within the left upper lobe peripherally on image 28/66 series 6, measuring up to 4 mm. Linear opacity right lung base. Central airways are grossly patent. No pneumothorax.  Multilevel degenerative changes.  No acute osseous finding.  Review of the MIP images confirms the above findings.  IMPRESSION: No pulmonary embolism identified.  Mild clustered nodular opacity left upper lobe is favored to be post infectious/inflammatory.  Prominence of the main pulmonary artery may reflect pulmonary arterial hypertension.  Enlarged thyroid gland with nonspecific rounded calcification along the isthmus. Consider a nonemergent ultrasound follow-up.  Indeterminate 2.1 cm right adrenal nodule. Recommend adrenal protocol CT or MRI.   Electronically Signed   By: Andrew  DelGaizo M.D.   On: 10/26/2013 23:12    Cardiac Studies:  ECG:  Orders placed during the hospital encounter of 10/26/13  . EKG 12-LEAD  . EKG 12-LEAD  . EKG     Telemetry:  NSR no arrhythmia   Echo:  8/29 reviewed very mild AS normal EF   Medications:   . aspirin  81 mg Oral Daily  . enalapril  10 mg Oral Daily  . enoxaparin (LOVENOX) injection  40 mg Subcutaneous Q24H  . furosemide  40 mg Oral Daily  . hydrochlorothiazide  25 mg Oral Daily  . influenza vac split quadrivalent PF  0.5 mL Intramuscular Tomorrow-1000  . isosorbide-hydrALAZINE  0.5 tablet Oral BID  .  nebivolol  5 mg Oral Daily  . potassium chloride SA  20 mEq Oral BID  . sodium chloride  3 mL Intravenous Q12H  . sodium chloride  3 mL Intravenous Q12H  . traZODone  200 mg Oral QHS     . sodium chloride 50 mL/hr at 10/28/13 0506    Assessment/Plan:  AS:  Not clinically significant check pull back gradient at cath Dyspnea:  Functional but suspect may be anginal equivalent  Normal CXR   HTN:  Continue ACE and diuretic Chest Pain:  Right and left heart cath planned latter today  [name] 10/28/2013, 9:12 AM     

## 2013-10-28 NOTE — Progress Notes (Signed)
Pt a/o, c/o pain PRN meds given as ordered, pt had echo today (see results review), pt NPO awaiting cath procedure today, vss, pt stable, groin sites prepped and areas clean/dry/intact, will continue to monitor

## 2013-10-28 NOTE — Plan of Care (Signed)
Problem: Phase I Progression Outcomes Goal: EF % per last Echo/documented,Core Reminder form on chart Outcome: Completed/Met Date Met:  10/28/13 EF as of 10/28/2013 60-64%

## 2013-10-28 NOTE — Progress Notes (Signed)
[facility] PROGRESS NOTE  [name] AVW:[id number] DOB: 11/02/1933 DOA: 10/26/2013 PCP: Sanda Linger, MD  Assessment/Plan: 1-SOB and chest tightness: secondary to elevated BP and diastolic heart failure. -troponin neg X3 -no ischemic changes on telemetry or EKG -2-D echo w/o wall motion abnormalities and left cath with no CAD. -plan is to control BP better, continue lasix and low sodium diet  2-HTN: will treat with bidil, lisinopril, b-blocker and lasix -patient advise to follow low sodium diet -follow up in 1 week with PCP, further adjustment other regimen as needed  3-abnormal TSH and thyroid nodules: labwork suggesting hypothyroidism. Per endocrinology rec's will hold starting synthroid until follow up visit with her (Dr. Elvera Lennox) on 11/04/13 -Free T4 0.65 L -TSH 7.8  4-adrenal nodule: follow up with Dr. Elvera Lennox next [facility] for further evaluation and treatment. (will need cortisol level, ACTH, etc..)  5-depression and insomnia: continue trazodone  6-anxiety: stable. Continue PRN xanax  7-long nodules: low risk for malignancy. Follow radiology rec's repeat CT chest in 6-12 months.  DVT: lovenox.   Code Status: Full Family Communication: no family at bedside Disposition Plan: home in am   Consultants:  Cardiology  Endocrinology (curbside by phone; Dr. Elvera Lennox)  Procedures:  Left heart cath  2-D echo (normal EF, no wall motion abnormalities and grade 1 diastolic heart failure)  Antibiotics:  None   HPI/Subjective: Feeling a lot better, denies CP or SOB. Had heart cath and 2-D echo at the end of day, results WNL. Plan is to control BP and discharge home in am.  Objective: Filed Vitals:   10/28/13 1659  BP:   Pulse: 57  Temp:   Resp:     Intake/Output Summary (Last 24 hours) at 10/28/13 1854 Last data filed at 10/28/13 1803  Gross per 24 hour  Intake    120 ml  Output   1500 ml  Net  -1380 ml   Filed Weights   10/27/13 0100 10/28/13 0441   Weight: 97.3 kg (214 lb 8.1 oz) 94.53 kg (208 lb 6.4 oz)    Exam:   General:  NAD, breathing better and denies CP  Cardiovascular: S1 and S2, no rubs or gallops  Respiratory: CTA bilaterally  Abdomen: soft, NT, no distended, positive BS  Musculoskeletal: trace edema bilaterally, no cyanosis  Data Reviewed: Basic Metabolic Panel:  Recent Labs Lab 10/26/13 0907 10/26/13 2024 10/27/13 0245  NA 142 141 139  K 4.1 3.4* 3.5  CL 104 101 100  CO2 34*  --  29  GLUCOSE 102* 110* 105*  BUN 11 10 8   CREATININE 0.6 0.90 0.55  CALCIUM 9.3  --  9.3  MG 1.6  --   --    Liver Function Tests:  Recent Labs Lab 10/27/13 0245  AST 19  ALT 12  ALKPHOS 60  BILITOT 0.8  PROT 6.4  ALBUMIN 3.4*   CBC:  Recent Labs Lab 10/26/13 0907 10/26/13 2024 10/27/13 0245  WBC 6.3  --  7.5  NEUTROABS 3.4  --  4.0  HGB 12.6 14.3 12.6  HCT 37.8 42.0 39.1  MCV 91.7  --  94.9  PLT 130.0*  --  128*   Cardiac Enzymes:  Recent Labs Lab 10/26/13 0907 10/27/13 0245 10/27/13 0515 10/27/13 1412  TROPONINI <0.01 <0.30 <0.30 <0.30   BNP (last 3 results)  Recent Labs  10/26/13 0907 10/26/13 2015  PROBNP 183.0* 538.7*    Studies: Dg Chest 2 View  10/26/2013   CLINICAL DATA:  Shortness of breath  and chest tightness.  EXAM: CHEST - 2 VIEW  COMPARISON:  09/09/2012  FINDINGS: The heart size is at the upper limits of normal. There is stable tortuosity of the thoracic aorta. There is no evidence of pulmonary edema, consolidation, pneumothorax, nodule or pleural fluid. The visualized bony thorax is unremarkable.  IMPRESSION: No active disease.   Electronically Signed   By: Irish Lack M.D.   On: 10/26/2013 20:34   Ct Head Wo Contrast  10/26/2013   CLINICAL DATA:  *Elevated D-dimer, edema.  EXAM: CT HEAD WITHOUT CONTRAST  TECHNIQUE: Contiguous axial images were obtained from the base of the skull through the vertex without intravenous contrast.  COMPARISON:  None.  FINDINGS:  Mild-to-moderate subcortical and periventricular white matter hypodensities, a nonspecific finding often seen in the setting of chronic microangiopathic change. No definite CT evidence of an acute infarction. No intraparenchymal hemorrhage, mass, mass, or abnormal extra-axial fluid collection. The visualized paranasal sinuses and mastoid air cells are predominantly clear.  IMPRESSION: Mild to moderate white matter changes as above. No CT evidence of an acute intracranial abnormality.   Electronically Signed   By: Jearld Lesch M.D.   On: 10/26/2013 22:58   Ct Angio Chest Pe W/cm &/or Wo Cm  10/26/2013   CLINICAL DATA:  Shortness of breath, elevated D-dimer  EXAM: CT ANGIOGRAPHY CHEST WITH CONTRAST  TECHNIQUE: Multidetector CT imaging of the chest was performed using the standard protocol during bolus administration of intravenous contrast. Multiplanar CT image reconstructions including MIPs were obtained to evaluate the vascular anatomy.  CONTRAST:  80mL OMNIPAQUE IOHEXOL 350 MG/ML SOLN  COMPARISON:  10/26/2013 chest radiograph, 04/05/2013 abdominal pelvic CT  FINDINGS: No pulmonary arterial branch filling defect. The main pulmonary artery measures 3.6 cm in transverse diameter.  Enlarged thyroid gland. Rounded calcification within the isthmic region. Normal caliber aorta with scattered atherosclerotic disease.  Heart size upper normal to mildly enlarged. Coronary artery and aortic valvular calcifications.  No pleural or pericardial effusion. No intrathoracic lymphadenopathy.  Limited upper abdominal images show an indeterminate right adrenal nodule measuring 2.1 cm.  Lung evaluation suboptimal in the expiratory phase and with respiratory motion. Within this limitation, clustered nodular opacities within the left upper lobe peripherally on image 28/66 series 6, measuring up to 4 mm. Linear opacity right lung base. Central airways are grossly patent. No pneumothorax.  Multilevel degenerative changes.  No acute  osseous finding.  Review of the MIP images confirms the above findings.  IMPRESSION: No pulmonary embolism identified.  Mild clustered nodular opacity left upper lobe is favored to be post infectious/inflammatory.  Prominence of the main pulmonary artery may reflect pulmonary arterial hypertension.  Enlarged thyroid gland with nonspecific rounded calcification along the isthmus. Consider a nonemergent ultrasound follow-up.  Indeterminate 2.1 cm right adrenal nodule. Recommend adrenal protocol CT or MRI.   Electronically Signed   By: Jearld Lesch M.D.   On: 10/26/2013 23:12    Scheduled Meds: . aspirin  81 mg Oral Daily  . enalapril  10 mg Oral Daily  . [START ON 10/29/2013] enoxaparin (LOVENOX) injection  40 mg Subcutaneous Q24H  . furosemide  40 mg Oral Daily  . [START ON 10/29/2013] isosorbide-hydrALAZINE  1 tablet Oral BID  . nebivolol  5 mg Oral Daily  . potassium chloride SA  20 mEq Oral BID  . sodium chloride  3 mL Intravenous Q12H  . traZODone  200 mg Oral QHS   Continuous Infusions: . sodium chloride 50 mL/hr at 10/28/13 0506  .  sodium chloride 75 mL/hr at 10/28/13 1830     Time spent: < 30 minutes    Chanay Nugent  [facility] Pager [phone]. If 7PM-7AM, please contact night-coverage at [email or web address], password [facility] 10/28/2013, 6:54 PM  LOS: 2 days

## 2013-10-28 NOTE — Telephone Encounter (Signed)
ONO with CPAP and 1 liter 10/19/13 >> Test time 7 hrs 32 min.  Basal SpO2 93.8%, low SpO2 89%.  Pt is current in hospital being evaluated for congestive heart failure.  Informed pt that ONO looks good on current set up.  No change to set up needed at this time.  Would like to defer addition of stimulant medication (ie nuvigil) at this time, and instead have her use CPAP more consistently for entire time she is asleep, especially in light of concern for congestive heart failure.

## 2013-10-28 NOTE — Interval H&P Note (Signed)
History and Physical Interval Note:  10/28/2013 5:20 PM  [name]  has presented today for surgery, with the diagnosis of cp andCath Lab Visit (complete for each Cath Lab visit)  Clinical Evaluation Leading to the Procedure:   ACS: no  Non-ACS:    Anginal Classification: CCS III  Anti-ischemic medical therapy: Maximal Therapy (2 or more classes of medications)  Non-Invasive Test Results: No non-invasive testing performed  Prior CABG: No previous CABG       dyspnea The various methods of treatment have been discussed with the patient and family. After consideration of risks, benefits and other options for treatment, the patient has consented to  Procedure(s): LEFT AND RIGHT HEART CATHETERIZATION WITH CORONARY ANGIOGRAM (N/A) as a surgical intervention .  The patient's history has been reviewed, patient examined, no change in status, stable for surgery.  I have reviewed the patient's chart and labs.  Questions were answered to the patient's satisfaction.     Aliesha Dolata

## 2013-10-28 NOTE — H&P (View-Only) (Signed)
Patient ID: [name], female   DOB: [birth year], 79 y.o.   MRN: [id number]    Subjective:  Denies SSCP, palpitations or Dyspnea   Objective:  Filed Vitals:   10/27/13 2021 10/27/13 2132 10/28/13 0010 10/28/13 0441  BP: 133/68 123/75 104/51 125/65  Pulse: 63 59 60 61  Temp:  98.1 F (36.7 C) 98.4 F (36.9 C) 97.6 F (36.4 C)  TempSrc:  Oral Oral Oral  Resp:  16 16 16   Height:      Weight:    208 lb 6.4 oz (94.53 kg)  SpO2:  97% 95% 97%    Intake/Output from previous day:  Intake/Output Summary (Last 24 hours) at 10/28/13 0912 Last data filed at 10/28/13 0865  Gross per 24 hour  Intake    600 ml  Output   2100 ml  Net  -1500 ml    Physical Exam: Affect appropriate Obese black female  HEENT: normal Neck supple with no adenopathy JVP normal no bruits no thyromegaly Lungs clear with no wheezing and good diaphragmatic motion Heart:  S1/S2 no murmur, no rub, gallop or click PMI normal Abdomen: benighn, BS positve, no tenderness, no AAA no bruit.  No HSM or HJR Distal pulses intact with no bruits No edema Neuro non-focal Skin warm and dry No muscular weakness   Lab Results: Basic Metabolic Panel:  Recent Labs  78/46/96 0907 10/26/13 2024 10/27/13 0245  NA 142 141 139  K 4.1 3.4* 3.5  CL 104 101 100  CO2 34*  --  29  GLUCOSE 102* 110* 105*  BUN 11 10 8   CREATININE 0.6 0.90 0.55  CALCIUM 9.3  --  9.3  MG 1.6  --   --    Liver Function Tests:  Recent Labs  10/27/13 0245  AST 19  ALT 12  ALKPHOS 60  BILITOT 0.8  PROT 6.4  ALBUMIN 3.4*   CBC:  Recent Labs  10/26/13 0907 10/26/13 2024 10/27/13 0245  WBC 6.3  --  7.5  NEUTROABS 3.4  --  4.0  HGB 12.6 14.3 12.6  HCT 37.8 42.0 39.1  MCV 91.7  --  94.9  PLT 130.0*  --  128*   Cardiac Enzymes:  Recent Labs  10/27/13 0245 10/27/13 0515 10/27/13 1412  TROPONINI <0.30 <0.30 <0.30   D-Dimer:  Recent Labs  10/26/13 0907  DDIMER 0.89*  Thyroid Function Tests:  Recent Labs  10/27/13 0245  TSH 5.235*    Imaging: Dg Chest 2 View  10/26/2013   CLINICAL DATA:  Shortness of breath and chest tightness.  EXAM: CHEST - 2 VIEW  COMPARISON:  09/09/2012  FINDINGS: The heart size is at the upper limits of normal. There is stable tortuosity of the thoracic aorta. There is no evidence of pulmonary edema, consolidation, pneumothorax, nodule or pleural fluid. The visualized bony thorax is unremarkable.  IMPRESSION: No active disease.   Electronically Signed   By: Irish Lack M.D.   On: 10/26/2013 20:34   Ct Head Wo Contrast  10/26/2013   CLINICAL DATA:  *Elevated D-dimer, edema.  EXAM: CT HEAD WITHOUT CONTRAST  TECHNIQUE: Contiguous axial images were obtained from the base of the skull through the vertex without intravenous contrast.  COMPARISON:  None.  FINDINGS: Mild-to-moderate subcortical and periventricular white matter hypodensities, a nonspecific finding often seen in the setting of chronic microangiopathic change. No definite CT evidence of an acute infarction. No intraparenchymal hemorrhage, mass, mass, or abnormal extra-axial fluid collection. The visualized paranasal sinuses and  mastoid air cells are predominantly clear.  IMPRESSION: Mild to moderate white matter changes as above. No CT evidence of an acute intracranial abnormality.   Electronically Signed   By: Jearld Lesch M.D.   On: 10/26/2013 22:58   Ct Angio Chest Pe W/cm &/or Wo Cm  10/26/2013   CLINICAL DATA:  Shortness of breath, elevated D-dimer  EXAM: CT ANGIOGRAPHY CHEST WITH CONTRAST  TECHNIQUE: Multidetector CT imaging of the chest was performed using the standard protocol during bolus administration of intravenous contrast. Multiplanar CT image reconstructions including MIPs were obtained to evaluate the vascular anatomy.  CONTRAST:  80mL OMNIPAQUE IOHEXOL 350 MG/ML SOLN  COMPARISON:  10/26/2013 chest radiograph, 04/05/2013 abdominal pelvic CT  FINDINGS: No pulmonary arterial branch filling defect. The  main pulmonary artery measures 3.6 cm in transverse diameter.  Enlarged thyroid gland. Rounded calcification within the isthmic region. Normal caliber aorta with scattered atherosclerotic disease.  Heart size upper normal to mildly enlarged. Coronary artery and aortic valvular calcifications.  No pleural or pericardial effusion. No intrathoracic lymphadenopathy.  Limited upper abdominal images show an indeterminate right adrenal nodule measuring 2.1 cm.  Lung evaluation suboptimal in the expiratory phase and with respiratory motion. Within this limitation, clustered nodular opacities within the left upper lobe peripherally on image 28/66 series 6, measuring up to 4 mm. Linear opacity right lung base. Central airways are grossly patent. No pneumothorax.  Multilevel degenerative changes.  No acute osseous finding.  Review of the MIP images confirms the above findings.  IMPRESSION: No pulmonary embolism identified.  Mild clustered nodular opacity left upper lobe is favored to be post infectious/inflammatory.  Prominence of the main pulmonary artery may reflect pulmonary arterial hypertension.  Enlarged thyroid gland with nonspecific rounded calcification along the isthmus. Consider a nonemergent ultrasound follow-up.  Indeterminate 2.1 cm right adrenal nodule. Recommend adrenal protocol CT or MRI.   Electronically Signed   By: Jearld Lesch M.D.   On: 10/26/2013 23:12    Cardiac Studies:  ECG:  Orders placed during the hospital encounter of 10/26/13  . EKG 12-LEAD  . EKG 12-LEAD  . EKG     Telemetry:  NSR no arrhythmia   Echo:  8/29 reviewed very mild AS normal EF   Medications:   . aspirin  81 mg Oral Daily  . enalapril  10 mg Oral Daily  . enoxaparin (LOVENOX) injection  40 mg Subcutaneous Q24H  . furosemide  40 mg Oral Daily  . hydrochlorothiazide  25 mg Oral Daily  . influenza vac split quadrivalent PF  0.5 mL Intramuscular Tomorrow-1000  . isosorbide-hydrALAZINE  0.5 tablet Oral BID  .  nebivolol  5 mg Oral Daily  . potassium chloride SA  20 mEq Oral BID  . sodium chloride  3 mL Intravenous Q12H  . sodium chloride  3 mL Intravenous Q12H  . traZODone  200 mg Oral QHS     . sodium chloride 50 mL/hr at 10/28/13 1610    Assessment/Plan:  AS:  Not clinically significant check pull back gradient at cath Dyspnea:  Functional but suspect may be anginal equivalent  Normal CXR   HTN:  Continue ACE and diuretic Chest Pain:  Right and left heart cath planned latter today  Charlton Haws 10/28/2013, 9:12 AM

## 2013-10-28 NOTE — Progress Notes (Signed)
Echocardiogram 2D Echocardiogram has been performed.  [name] 10/28/2013, 10:54 AM

## 2013-10-29 DIAGNOSIS — G4739 Other sleep apnea: Secondary | ICD-10-CM

## 2013-10-29 DIAGNOSIS — K219 Gastro-esophageal reflux disease without esophagitis: Secondary | ICD-10-CM

## 2013-10-29 MED ORDER — ASPIRIN 81 MG PO CHEW: 81.0000 mg | CHEWABLE_TABLET | Freq: Every day | ORAL | Status: DC

## 2013-10-29 MED ORDER — ENALAPRIL MALEATE 20 MG PO TABS: 20.0000 mg | ORAL_TABLET | Freq: Every day | ORAL | Status: DC

## 2013-10-29 MED ORDER — ISOSORB DINITRATE-HYDRALAZINE 20-37.5 MG PO TABS: 0.5000 | ORAL_TABLET | Freq: Two times a day (BID) | ORAL | Status: DC

## 2013-10-29 NOTE — Progress Notes (Signed)
Spoke with patient in regards to readiness for nighttime CPAP, patient currently refuses. RT will assist as needed. RN made aware

## 2013-10-29 NOTE — Progress Notes (Signed)
Patient discharged to home with family.  IV removed prior to discharge, IV site clean, dry, and intact.  Discharge teaching discussed with patient and her daughter, patient and daughter voiced understanding of discharge education.

## 2013-10-29 NOTE — Progress Notes (Signed)
Patient ID: KELICIA YOUTZ, female   DOB: [birth year], 79 y.o.   MRN: [id number] Patient ID: GRETEL CANTU, female   DOB: 1933-11-17, 79 y.o.   MRN: [id number]    Subjective:  Denies SSCP, palpitations or Dyspnea   Objective:  Filed Vitals:   10/28/13 2200 10/29/13 0151 10/29/13 0432 10/29/13 0623  BP: 142/64 114/54  126/45  Pulse:  64 58 58  Temp:  98.9 F (37.2 C)  98.1 F (36.7 C)  TempSrc:  Oral  Tympanic  Resp:  19  18  Height:      Weight:    205 lb 9.6 oz (93.26 kg)  SpO2: 94% 95% 92% 97%    Intake/Output from previous day:  Intake/Output Summary (Last 24 hours) at 10/29/13 0827 Last data filed at 10/28/13 2254  Gross per 24 hour  Intake      0 ml  Output   2500 ml  Net  -2500 ml    Physical Exam: Affect appropriate Obese black female  HEENT: normal Neck supple with no adenopathy JVP normal no bruits no thyromegaly Lungs clear with no wheezing and good diaphragmatic motion Heart:  S1/S2 no murmur, no rub, gallop or click PMI normal Abdomen: benighn, BS positve, no tenderness, no AAA no bruit.  No HSM or HJR Distal pulses intact with no bruits No edema Neuro non-focal Skin warm and dry No muscular weakness   Lab Results: Basic Metabolic Panel:  Recent Labs  78/29/56 0907 10/26/13 2024 10/27/13 0245  NA 142 141 139  K 4.1 3.4* 3.5  CL 104 101 100  CO2 34*  --  29  GLUCOSE 102* 110* 105*  BUN 11 10 8   CREATININE 0.6 0.90 0.55  CALCIUM 9.3  --  9.3  MG 1.6  --   --    Liver Function Tests:  Recent Labs  10/27/13 0245  AST 19  ALT 12  ALKPHOS 60  BILITOT 0.8  PROT 6.4  ALBUMIN 3.4*   CBC:  Recent Labs  10/26/13 0907 10/26/13 2024 10/27/13 0245  WBC 6.3  --  7.5  NEUTROABS 3.4  --  4.0  HGB 12.6 14.3 12.6  HCT 37.8 42.0 39.1  MCV 91.7  --  94.9  PLT 130.0*  --  128*   Cardiac Enzymes:  Recent Labs  10/27/13 0245 10/27/13 0515 10/27/13 1412  TROPONINI <0.30 <0.30 <0.30   D-Dimer:  Recent Labs  10/26/13 0907  DDIMER  0.89*  Thyroid Function Tests:  Recent Labs  10/27/13 0245  TSH 5.235*    Imaging: No results found.  Cardiac Studies:  ECG:  NSR LVH nonspecific STT wave changes    Telemetry:  NSR no arrhythmia   Echo:  8/29 reviewed very mild AS normal EF   Medications:   . aspirin  81 mg Oral Daily  . enalapril  10 mg Oral Daily  . enoxaparin (LOVENOX) injection  40 mg Subcutaneous Q24H  . furosemide  40 mg Oral Daily  . isosorbide-hydrALAZINE  1 tablet Oral BID  . nebivolol  5 mg Oral Daily  . potassium chloride SA  20 mEq Oral BID  . sodium chloride  3 mL Intravenous Q12H  . traZODone  200 mg Oral QHS     . sodium chloride 50 mL/hr at 10/28/13 2130    Assessment/Plan:  AS:  Not clinically significant f/u echo in 2 years  Dyspnea:  Functional with normla CXR cath and EF  Consider PT/OT and Silver S[facility] at [facility]  HTN:  Continue ACE and diuretic Chest Pain:  Non cardiac no CAD at cath  D/C home today F/U Dr Dorthula Matas 10/29/2013, 8:27 AM

## 2013-10-29 NOTE — Discharge Summary (Signed)
Physician Discharge Summary  [name] ZOX:[id number] DOB: [birth year] DOA: 10/26/2013  PCP: Sanda Linger, MD  Admit date: 10/26/2013 Discharge date: 10/29/2013  Time spent: >30 minutes  Recommendations for Outpatient Follow-up:  1. BMET to follow electrolytes and renal function 2. Reassess BP and adjust medications as needed 3. Repeat CT chest in 6-12 months to follow lung nodules 4. Follow up with endocrinologist as indicated for further evaluation and treatment of her multinodular thyroid disease and adrenal nodule.   BNP    Component Value Date/Time   PROBNP 538.7* 10/26/2013 2015   Filed Weights   10/27/13 0100 10/28/13 0441 10/29/13 8119  Weight: 97.3 kg (214 lb 8.1 oz) 94.53 kg (208 lb 6.4 oz) 93.26 kg (205 lb 9.6 oz)     Discharge Diagnoses:  Principal Problem:   DOE (dyspnea on exertion) Active Problems:   OSA (obstructive sleep apnea)   Aortic stenosis, mild   Chest pain   SOB (shortness of breath)   Discharge Condition: stable and improved. Will discharge home and patient will follow with PCP and endocrinology as instructed.  Diet recommendation: low sodium diet   History of present illness:  79 y.o. female history of OSA and hypertension has been experiencing increasing shortness of breath over the last 3-4 days. Shortness of breath is present at rest also but increase in exertion. At the same time patient has been having some chest tightness. Patient has had a 2-D echo in August of this year which showed EF of 65-70% with mild aortic stenosis. Patient states that she also noticed that she had mild edema of the lower extremities and hand. Her primary care was planning to refer her cardiologist and had prescribed her Lasix. Labs done at the office showed mildly elevated d-dimer. Patient presented to the ER and had a CT angiogram of the chest which was negative for PE but showed lung nodules which may be post infectious or inflammatory. Patient had taken Lasix at  home and had good diuresis at this time her shortness of breath is better and has been admitted for further management.    Hospital Course:  1-SOB and chest tightness: secondary to elevated BP and diastolic heart failure.  -troponin neg X3  -no ischemic changes on telemetry or EKG  -2-D echo w/o wall motion abnormalities and left cath with no CAD.  -plan is to control BP better, continue lasix, ACE, ASA, statins and low sodium diet (essentially risk factors modifications)  2-HTN: will treat with bidil, lisinopril, b-blocker and lasix  -patient advise to follow low sodium diet  -follow up in 1 week (7-10 days) with PCP, at that time plan is to repeat BMET and further adjust Antihypertensive regimen as needed   3-abnormal TSH and thyroid nodules: labwork suggesting hypothyroidism. Per endocrinology rec's will hold starting synthroid until follow up visit with her (Dr. Elvera Lennox) on 11/04/13  -further treatment evaluation to be decided by endocrinologist. -Free T4 0.65 L  -TSH 7.8   4-adrenal nodule: follow up with Dr. Elvera Lennox next [facility] for further evaluation and treatment. (will need cortisol level, ACTH, etc..)   5-depression and insomnia: continue trazodone   6-anxiety: stable. Continue PRN xanax   7-long nodules: Following radiology rec's and given low risk factors for malignancy, plan is to repeat CT chest in 6-12 months   Procedures: Left heart cath (no CA disease) 2-D echo (normal EF, no wall motion abnormalities and grade 1 diastolic heart failure)  Consultations:  Cardiology  Endocrinology (Dr. Elvera Lennox curbside)  Discharge Exam: Filed Vitals:   10/29/13 1010  BP: 131/48  Pulse: 60  Temp:   Resp:    General: NAD, breathing better and denies CP  Cardiovascular: S1 and S2, no rubs or gallops  Respiratory: CTA bilaterally  Abdomen: soft, NT, no distended, positive BS  Musculoskeletal: trace edema bilaterally, no cyanosis   Discharge Instructions  Discharge  Orders   Future Appointments Provider Department Dept Phone   11/04/2013 2:15 PM Carlus Pavlov, MD [facility] Endocrinology [phone]   11/15/2013 1:00 PM Etta Grandchild, MD [facility] -[place] [phone]   Future Orders Complete By Expires   Discharge instructions  As directed    Comments:     Follow low sodium diet (< 1500- 2000 mg daily) Take medications as prescribed Arrange follow up in 10 days with PCP Follow with endocrinology appointment as instructed Use your CPAP every night       Medication List    STOP taking these medications       Enalapril-Hydrochlorothiazide 5-12.5 MG per tablet      TAKE these medications       ALPRAZolam 1 MG tablet  Commonly known as:  XANAX  Take 1 mg by mouth 3 (three) times daily as needed for anxiety or sleep.     aspirin 81 MG chewable tablet  Chew 1 tablet (81 mg total) by mouth daily.     enalapril 20 MG tablet  Commonly known as:  VASOTEC  Take 1 tablet (20 mg total) by mouth daily.     furosemide 40 MG tablet  Commonly known as:  LASIX  Take 1 tablet (40 mg total) by mouth daily.     HYDROcodone-acetaminophen 10-325 MG per tablet  Commonly known as:  NORCO  Take 1 tablet by mouth every 8 (eight) hours as needed for pain.     isosorbide-hydrALAZINE 20-37.5 MG per tablet  Commonly known as:  BIDIL  Take 0.5 tablets by mouth 2 (two) times daily.     meclizine 25 MG tablet  Commonly known as:  ANTIVERT  Take 25 mg by mouth 3 (three) times daily as needed for dizziness.     nebivolol 5 MG tablet  Commonly known as:  BYSTOLIC  Take 1 tablet (5 mg total) by mouth daily.     potassium chloride SA 20 MEQ tablet  Commonly known as:  K-DUR,KLOR-CON  Take 1 tablet (20 mEq total) by mouth 2 (two) times daily.     traZODone 100 MG tablet  Commonly known as:  DESYREL  Take 2 tablets (200 mg total) by mouth at bedtime.     Vitamin D3 1000 UNITS Caps  Take 2,000 Units by mouth daily.        Allergies  Allergen Reactions  . Amlodipine     dizziness  . Metformin Diarrhea    Diarrhea, IBS       Follow-up Information   Follow up with Carlus Pavlov, MD On 11/04/2013. (appointment at 2:15pm (please arrive 20 minuts before your appointment time) )    Specialty:  Internal Medicine   Contact information:   [address] [place] [address] [phone]       Follow up with Sanda Linger, MD. Schedule an appointment as soon as possible for a visit in 10 days.   Specialty:  Internal Medicine   Contact information:   [address] [place] [address] [phone]  The results of significant diagnostics from this hospitalization (including imaging, microbiology, ancillary and laboratory) are listed below for reference.    Significant Diagnostic Studies: Dg Chest 2 View  10/26/2013   CLINICAL DATA:  Shortness of breath and chest tightness.  EXAM: CHEST - 2 VIEW  COMPARISON:  09/09/2012  FINDINGS: The heart size is at the upper limits of normal. There is stable tortuosity of the thoracic aorta. There is no evidence of pulmonary edema, consolidation, pneumothorax, nodule or pleural fluid. The visualized bony thorax is unremarkable.  IMPRESSION: No active disease.   Electronically Signed   By: Irish Lack M.D.   On: 10/26/2013 20:34   Ct Head Wo Contrast  10/26/2013   CLINICAL DATA:  *Elevated D-dimer, edema.  EXAM: CT HEAD WITHOUT CONTRAST  TECHNIQUE: Contiguous axial images were obtained from the base of the skull through the vertex without intravenous contrast.  COMPARISON:  None.  FINDINGS: Mild-to-moderate subcortical and periventricular white matter hypodensities, a nonspecific finding often seen in the setting of chronic microangiopathic change. No definite CT evidence of an acute infarction. No intraparenchymal hemorrhage, mass, mass, or abnormal extra-axial fluid collection. The visualized paranasal sinuses  and mastoid air cells are predominantly clear.  IMPRESSION: Mild to moderate white matter changes as above. No CT evidence of an acute intracranial abnormality.   Electronically Signed   By: Jearld Lesch M.D.   On: 10/26/2013 22:58   Ct Angio Chest Pe W/cm &/or Wo Cm  10/26/2013   CLINICAL DATA:  Shortness of breath, elevated D-dimer  EXAM: CT ANGIOGRAPHY CHEST WITH CONTRAST  TECHNIQUE: Multidetector CT imaging of the chest was performed using the standard protocol during bolus administration of intravenous contrast. Multiplanar CT image reconstructions including MIPs were obtained to evaluate the vascular anatomy.  CONTRAST:  80mL OMNIPAQUE IOHEXOL 350 MG/ML SOLN  COMPARISON:  10/26/2013 chest radiograph, 04/05/2013 abdominal pelvic CT  FINDINGS: No pulmonary arterial branch filling defect. The main pulmonary artery measures 3.6 cm in transverse diameter.  Enlarged thyroid gland. Rounded calcification within the isthmic region. Normal caliber aorta with scattered atherosclerotic disease.  Heart size upper normal to mildly enlarged. Coronary artery and aortic valvular calcifications.  No pleural or pericardial effusion. No intrathoracic lymphadenopathy.  Limited upper abdominal images show an indeterminate right adrenal nodule measuring 2.1 cm.  Lung evaluation suboptimal in the expiratory phase and with respiratory motion. Within this limitation, clustered nodular opacities within the left upper lobe peripherally on image 28/66 series 6, measuring up to 4 mm. Linear opacity right lung base. Central airways are grossly patent. No pneumothorax.  Multilevel degenerative changes.  No acute osseous finding.  Review of the MIP images confirms the above findings.  IMPRESSION: No pulmonary embolism identified.  Mild clustered nodular opacity left upper lobe is favored to be post infectious/inflammatory.  Prominence of the main pulmonary artery may reflect pulmonary arterial hypertension.  Enlarged thyroid gland with  nonspecific rounded calcification along the isthmus. Consider a nonemergent ultrasound follow-up.  Indeterminate 2.1 cm right adrenal nodule. Recommend adrenal protocol CT or MRI.   Electronically Signed   By: Jearld Lesch M.D.   On: 10/26/2013 23:12   Labs: Basic Metabolic Panel:  Recent Labs Lab 10/26/13 0907 10/26/13 2024 10/27/13 0245  NA 142 141 139  K 4.1 3.4* 3.5  CL 104 101 100  CO2 34*  --  29  GLUCOSE 102* 110* 105*  BUN 11 10 8   CREATININE 0.6 0.90 0.55  CALCIUM 9.3  --  9.3  MG  1.6  --   --    Liver Function Tests:  Recent Labs Lab 10/27/13 0245  AST 19  ALT 12  ALKPHOS 60  BILITOT 0.8  PROT 6.4  ALBUMIN 3.4*   CBC:  Recent Labs Lab 10/26/13 0907 10/26/13 2024 10/27/13 0245  WBC 6.3  --  7.5  NEUTROABS 3.4  --  4.0  HGB 12.6 14.3 12.6  HCT 37.8 42.0 39.1  MCV 91.7  --  94.9  PLT 130.0*  --  128*   Cardiac Enzymes:  Recent Labs Lab 10/26/13 0907 10/27/13 0245 10/27/13 0515 10/27/13 1412  TROPONINI <0.01 <0.30 <0.30 <0.30   BNP: BNP (last 3 results)  Recent Labs  10/26/13 0907 10/26/13 2015  PROBNP 183.0* 538.7*   Signed:  Abdulkadir Emmanuel  [facility] 10/29/2013, 1:08 PM

## 2013-10-29 NOTE — Addendum Note (Signed)
Addended by: Etta Grandchild on: 10/29/2013 02:05 PM   Modules accepted: Orders

## 2013-10-29 NOTE — Progress Notes (Signed)
Utilization Review Completed Stefannie Defeo J. Josias Tomerlin, RN, BSN, NCM [phone]  

## 2013-11-04 ENCOUNTER — Ambulatory Visit (INDEPENDENT_AMBULATORY_CARE_PROVIDER_SITE_OTHER): Payer: Medicare HMO | Admitting: Internal Medicine

## 2013-11-04 ENCOUNTER — Encounter: Payer: Self-pay | Admitting: Internal Medicine

## 2013-11-04 VITALS — BP 118/76 | HR 64 | Temp 98.0°F | Ht 62.0 in | Wt 210.0 lb

## 2013-11-04 DIAGNOSIS — E278 Other specified disorders of adrenal gland: Secondary | ICD-10-CM | POA: Insufficient documentation

## 2013-11-04 DIAGNOSIS — R946 Abnormal results of thyroid function studies: Secondary | ICD-10-CM

## 2013-11-04 DIAGNOSIS — R7989 Other specified abnormal findings of blood chemistry: Secondary | ICD-10-CM | POA: Insufficient documentation

## 2013-11-04 DIAGNOSIS — E042 Nontoxic multinodular goiter: Secondary | ICD-10-CM

## 2013-11-04 LAB — T4, FREE: Free T4: 0.47 ng/dL — ABNORMAL LOW (ref 0.60–1.60)

## 2013-11-04 MED ORDER — DEXAMETHASONE 1 MG PO TABS: 1.0000 mg | ORAL_TABLET | Freq: Two times a day (BID) | ORAL | Status: DC

## 2013-11-04 NOTE — Patient Instructions (Signed)
Please return in 6 months. We will likely need to order a repeat CT of your adrenals then. Please stop at the lab. Please come back at 8 am after you take the Dexamethasone at 11 pm the night prior. You will also be called with the scheduling of the thyroid ultrasound.

## 2013-11-04 NOTE — Progress Notes (Addendum)
Patient ID: [name], female   DOB: 15-Dec-1932, 79 y.o.   MRN: [id number]   HPI  [name] is a 79 y.o.-year-old female, referred by Dr. Gwenlyn Perking ([facility]), for evaluation and management  an adrenal incidentaloma and hypothyroidism. She is here with her daughter who offers part of the history.  She was recently hospitalized for anasarca and DOE. She also had a positive d-dimer (but later r/o for PE by CT angio). She was diuresed >> d/c'd 10/29/2013. She still has SOB, but not swelling. She still feels tired and dizzy.   Low TSH: Lab Results  Component Value Date   TSH 5.235* 10/27/2013   TSH 7.83* 10/26/2013   TSH 3.50 12/24/2012   TSH 2.95 04/16/2012   TSH 4.55 12/19/2011   FREET4 0.65* 10/28/2013   On her angio chest CT, she was found to have enlarged thyroid gland with nonspecific rounded calcification along the isthmus.   No significant neck compression sxs: but she admits for some dysphagia, hoarseness and feels lumps in neck.  She c/o fatigue, constipated, weight loss (after diuresis in the hospital), no heat or cold intolerance.   Adrenal incidentaloma: Recent CT scan: Indeterminate 2.1 cm right adrenal nodule (39 HU).   She does have a h/o HTN, recently in the hospital, sBP in the 200. She has had HTN x at least 10 years.She is on Lasix, Enalapril, Hydralazine, Isosorbide,bystolic, HCTZ. BP normal today. No h/o AMI, strokes. She had low K in the past and is on supplements. She has OSA >> wears it every night.   Has FH of HTN in mother and sister.  She does not have DM.  Lab Results  Component Value Date   HGBA1C 5.5 12/24/2012   ROS: Constitutional: no weight gain/loss, decreased appetite, + fatigue, occasional hot flushes, + poor sleep, + nocturia Eyes: + blurry vision, no xerophthalmia ENT: +sore throat, + nodules palpated in throat, + dysphagia/no odynophagia, + hoarseness, + tinnitus, + hypoacusis Cardiovascular: no CP/+ SOB/no palpitations/+ leg  swelling Respiratory: + cough/+ SOB Gastrointestinal: + N/no V/no D/+ C, + acid reflux Musculoskeletal: + muscle/+ joint aches Skin: no rashes, + easy bruising Neurological: no tremors/numbness/tingling/dizziness, + HA Psychiatric: no depression/+ anxiety  Past Medical History  Diagnosis Date  . Hyperlipidemia   . Hypertension   . Osteoarthritis   . Wears dentures   . Bruises easily   . Diarrhea   . Abdominal pain   . Wears glasses   . Shortness of breath     WITH EXERTION USES 2 L O2 BEDTIME  . Oxygen desaturation during sleep     USES 2 LITERS BEDTIME   VIA CPAP 06/2012 [facility]   . Anxiety   . Depression   . Sleep apnea     CPAP WITH O2 2 LITERS 2013 ([facility])  . Diabetes mellitus     type II  DIET CONTROLLED  . Chronic headaches     HISTORY OF   . Anemia     NOS  . Hepatitis     AGE 30S  . Chronic diastolic CHF (congestive heart failure)     a. 06/2013 EF 65-70%.  . Mild aortic stenosis     a. 06/2013 Echo: EF 65-70%, mod LVH with focal basal hypertrophy, very mild AS.   Past Surgical History  Procedure Laterality Date  . Appendectomy    . Cholecystectomy    . Abdominal hysterectomy    . Tonsillectomy    . Revision total  knee arthroplasty  2011  . Rotator cuff repair  2010  . Tonsillectomy    . Joint replacement      LEFT KNEE   . Knee arthroscopy      RIGHT  . Shoulder arthroscopy  09/17/2012    Procedure: ARTHROSCOPY SHOULDER;  Surgeon: Kennieth Rad, MD;  Location: [facility] OR;  Service: Orthopedics;  Laterality: Left;   History   Social History  . Marital Status: Widowed    Spouse Name: N/A    Number of Children: 5   Occupational History  . retired      Administrator, sports at [employer]   Social History Main Topics  . Smoking status: Never Smoker   . Smokeless tobacco: Never Used  . Alcohol Use: No  . Drug Use: No   Current Outpatient Prescriptions on File Prior to Visit  Medication Sig Dispense Refill  . ALPRAZolam (XANAX) 1 MG tablet Take 1 mg by mouth  3 (three) times daily as needed for anxiety or sleep.      Marland Kitchen aspirin 81 MG chewable tablet Chew 1 tablet (81 mg total) by mouth daily.  30 tablet  1  . Cholecalciferol (VITAMIN D3) 1000 UNITS CAPS Take 2,000 Units by mouth daily.       . enalapril (VASOTEC) 20 MG tablet Take 1 tablet (20 mg total) by mouth daily.  30 tablet  1  . furosemide (LASIX) 40 MG tablet Take 1 tablet (40 mg total) by mouth daily.  30 tablet  0  . HYDROcodone-acetaminophen (NORCO) 10-325 MG per tablet Take 1 tablet by mouth every 8 (eight) hours as needed for pain.  90 tablet  3  . isosorbide-hydrALAZINE (BIDIL) 20-37.5 MG per tablet Take 0.5 tablets by mouth 2 (two) times daily.  30 tablet  1  . meclizine (ANTIVERT) 25 MG tablet Take 25 mg by mouth 3 (three) times daily as needed for dizziness.      . nebivolol (BYSTOLIC) 5 MG tablet Take 1 tablet (5 mg total) by mouth daily.  90 tablet  1  . potassium chloride SA (K-DUR,KLOR-CON) 20 MEQ tablet Take 1 tablet (20 mEq total) by mouth 2 (two) times daily.  180 tablet  1  . traZODone (DESYREL) 100 MG tablet Take 2 tablets (200 mg total) by mouth at bedtime.  180 tablet  3   No current facility-administered medications on file prior to visit.   Allergies  Allergen Reactions  . Amlodipine     dizziness  . Metformin Diarrhea    Diarrhea, IBS   Family History  Problem Relation Age of Onset  . Alcohol abuse Other   . Hypertension Other   . Kidney disease Other   . Mental illness Other   . Cancer Father     prostate cancer  . Heart disease Sister   . Heart disease Brother   . Emphysema Brother    PE: BP 118/76  Pulse 64  Temp(Src) 98 F (36.7 C) (Oral)  Ht 5\' 2"  (1.575 m)  Wt 210 lb (95.255 kg)  BMI 38.40 kg/m2  SpO2 97% Wt Readings from Last 3 Encounters:  11/04/13 210 lb (95.255 kg)  10/29/13 205 lb 9.6 oz (93.26 kg)  10/29/13 205 lb 9.6 oz (93.26 kg)   Constitutional: overweight, in NAD Eyes: PERRLA, EOMI, no exophthalmos, no lid lag, no stare ENT:  moist mucous membranes, no thyromegaly - no nodules felt, no cervical lymphadenopathy Cardiovascular: RRR, No MRG Respiratory: CTA B Gastrointestinal: abdomen soft, NT, ND, BS+  Musculoskeletal: no deformities, strength intact in all 4 Skin: moist, warm, no rashes Neurological: no tremor with outstretched hands, DTR normal in all 4  ASSESSMENT: 1. ADRENAL INCIDENTALOMA - RIGHT-SIDED, 2.1 CM, 30 hu  2. POSSIBLE HYPOTHYROIDISM - MILDLY ELEVATED TSH IN THE HOSPITAL DURING LAST ADMISSION - ALSO, LOW FREE t4  3. POSSIBLE ISTHMIC THYROID NODULE - THYROID CALCIFICATIONS SEEN ON CHEST CT   PLAN:  1. Patient with a 2.1 cm R adrenal nodule discovered incidentally during hospitalization for SOB + fluid retention. The nodule is 39 HU on the  - We discussed bout the fact that there are 3 possible scenarios:  - A nonfunctioning adrenal nodule - A functioning adrenal adenoma - which can hypersecrete catecholamines/metanephrines, cortisol, or aldosterone - Adrenal cancer/metastasis We do not have blood tests to check for cancer, but the best indicator is lack of change in size and appearance over time.  - To differentiate between a functioning and a nonfunctioning adrenal nodule, we'll need to rule out hypersecretion by checking the following tests  - dexamethasone suppression test to rule out Cushing syndrome (6% of adrenal incidentalomas)  - Plasma fractionated metanephrines and catecholamines to rule out pheochromocytoma (3% of adrenal incidentalomas) -THE BETA BLOCKER INFLUENCE THIS TEST - Plasma renin activity and aldosterone level to rule out primary hyperaldosteronism (0.6% of adrenal incidentalomas) - THE ACEI AND BETA BLOCKER CAN INFLUENCE THIS TEST - I ordered the above tests today. I advised pt to take the dexamethasone tablets (1 mg total dose, sent to pharmacy) at 11 PM the night before coming to the lab to have a cortisol level drawn. I also added dexamethasone level. - We discussed about  the need for an other CT scan, and I believe that we can wait for another 6 MO-1 YEAR and then have her back for repeat hormonal testing and ordering a dedicated adrenal CT. I will need to order it with and without contrast, if the Hounsfield units are low and the lesion again appears benign, we might not need to get the contrasted CT for washout.  - USUALLY f/u:   hormonal testing yearly for 5 years   CT scans yearly x1-2 - I explained all the above to the patient, and she agrees with the plan. - I will see her back in 6 mo  2. Hypothyroidism - possible sick euthyroid sd. in the hospital - will repeat TSH, fT4 and fT3 today, will also add anti TPO Abx  3. Possible thyroid nodules - check thyroid U/S   Office Visit on 11/04/2013  Component Date Value Range Status  . TSH 11/04/2013 1.06  0.35 - 5.50 uIU/mL Final  . Free T4 11/04/2013 0.47* 0.60 - 1.60 ng/dL Final  . T3, Free 78/29/5621 2.3  2.3 - 4.2 pg/mL Final  . Thyroid Peroxidase Antibody 11/04/2013 1001.0* <35.0 IU/mL Final   Comment: Result confirmed by manual dilution                                                     The thyroid microsomal antigen has been shown to be Thyroid                          Peroxidase (TPO).  This assay detects anti-TPO antibodies.  Marland Kitchen Epinephrine 11/04/2013 REPORT   Final   Comment:  Results are below reportable range for this analyte,                          which is 20 pg/mL.  Marland Kitchen Norepinephrine 11/04/2013 370   Final  . Dopamine 11/04/2013 REPORT   Final   Comment: Results are below reportable range for this analyte,                          which is 30 pg/mL.  . Catecholamines, Total 11/04/2013 370   Final   Comment: Adult Reference Ranges for Catecholamines, Plasma                          Epinephrine         Supine:  LESS THAN 50 pg/mL                                              Upright: LESS THAN 95 pg/mL                          Norepinephrine      Supine:  112-658 pg/mL                                               Upright: [phone] pg/mL                          Dopamine            Supine:  LESS THAN 30 pg/mL                                              Upright: LESS THAN 30 pg/mL                          Total (N+E)         Supine:  123-671 pg/mL                                              Upright: [phone] pg/mL                          Pediatric Reference Ranges for Catecholamines, Plasma                          Due to stress, plasma catecholamine levels are                          generally unreliable in infants and small children.                          Urinary catecholamine assays are more reliable.  Epinephrine         Supine:  LESS THAN OR EQUAL                             3-15 Years                TO 464 pg/mL                                              Upright: Not Available                          Norepinephrine      Supine:  LESS THAN OR EQUAL                             3-15 Years                TO 1251 pg/mL                                              Upright: Not Available                          Dopamine            Supine:  LESS THAN 60 pg/mL                             3-15 Years       Upright: Not Available                          Pediatric data from [facility] [phone].  [place], Free 11/04/2013 <25  <=57 pg/mL Final  . Normetanephrine, Free 11/04/2013 75  <=148 pg/mL Final  . Total Metanephrines-Plasma 11/04/2013 75  <=205 pg/mL Final   Comment: For more information on this test, go to                          http://education.questdiagn[facility].com/faq/MetFractFree                          Elevations >4-fold upper reference range: strongly                          suggestive of a pheochromocytoma(1).                          Elevations >1- 4-fold upper reference range:                          significant but not diagnostic, may be due to                          medications or stress. Suggest  running 24 hr urine  fractionated metanephrines and/or serum Chromagranin A                          for confirmation.                          Reference:                          (1)Algeciras-Schimnich A et al, Plasma Chromogranin A                          or Urine Fractionated Metanephrines Follow-Up Testing                          Improves the Diagnostic Accuracy of Plasma Fractionated                          Metanephrines for Pheochromocytoma. The Journal of                          Clinical Endocrinology # Metabolism 93(1), 91-95, 2008.  Marland Kitchen PRA LC/MS/MS 11/04/2013 3.04  0.25 - 5.82 ng/mL/h Final  . ALDO / PRA Ratio 11/04/2013 2.3  0.9 - 28.9 Ratio Final  . Aldosterone 11/04/2013 7   Final   Comment:                               Adult Reference Ranges for Aldosterone,                             LC/MS/MS:                                                         Upright 8:00-10:00 am    < or = 28 ng/dL                              Upright 4:00-6:00 pm     < or = 21 ng/dL                              Supine  8:00-10:00 am    3-16 ng/dL   No signs of pheochromocytoma or primary hyperaldosteronism.  She has slightly low fT4 and high TPO antibodies >> will keep an eye on the thyroid labs to see if she needs thyroid hh replacement in the future. Will have her back for labs in 2 months.  Thyroid U/S:  CLINICAL DATA: Enlarged thyroid gland with calcification seen on chest CT dated 10/26/2013  EXAM: THYROID ULTRASOUND  TECHNIQUE: Ultrasound examination of the thyroid gland and adjacent soft tissues was performed.  COMPARISON: CT scan dated 10/26/2013  FINDINGS: Right thyroid lobe  Measurements: 6.5 x 3.6 x 3.5 cm. There is 0.8 x 0.7 x 0.9 cm hypoechoic nodule in the mid right lobe. Remainder the right lobe is inhomogeneous and enlarged.  Left thyroid lobe  Measurements: 6.6 x 3.8 x 3.6  cm. There is a hyperechoic solid 1.2 x 1.0 x 1.2 cm nodule in  the left upper pole. There is a 4 mm cyst in the mid left lobe.  Isthmus  Thickness: Calcified 0.7 cm nodule in the right side of the isthmus which correlates with the calcification on CT scan. No nodules visualized.  Lymphadenopathy  None visualized.  IMPRESSION: Multinodular goiter. None of the visualized nodules fit criteria for fine needle aspiration biopsy at this time.   Electronically Signed By: Geanie Cooley M.D. On: 11/15/2013 15:32  No concerning nodules, will keep an eye on them, but no further intervention for now.

## 2013-11-05 ENCOUNTER — Other Ambulatory Visit (INDEPENDENT_AMBULATORY_CARE_PROVIDER_SITE_OTHER): Payer: Medicare HMO

## 2013-11-05 DIAGNOSIS — E278 Other specified disorders of adrenal gland: Secondary | ICD-10-CM

## 2013-11-05 LAB — CORTISOL: Cortisol, Plasma: 1.6 ug/dL

## 2013-11-09 LAB — METANEPHRINES, PLASMA
Metanephrine, Free: <25 pg/mL (ref <=57)
Normetanephrine, Free: 75 pg/mL (ref <=148)
Total Metanephrines-Plasma: 75 pg/mL (ref <=205)

## 2013-11-09 LAB — CATECHOLAMINES, FRACTIONATED, PLASMA: Catecholamines, Total: 370 pg/mL

## 2013-11-11 LAB — ALDOSTERONE + RENIN ACTIVITY W/ RATIO
ALDO / PRA Ratio: 2.3 Ratio (ref 0.9–28.9)
PRA LC/MS/MS: 3.04 ng/mL/h (ref 0.25–5.82)

## 2013-11-15 ENCOUNTER — Encounter: Payer: Self-pay | Admitting: Internal Medicine

## 2013-11-15 ENCOUNTER — Ambulatory Visit (INDEPENDENT_AMBULATORY_CARE_PROVIDER_SITE_OTHER): Payer: Medicare HMO | Admitting: Internal Medicine

## 2013-11-15 ENCOUNTER — Ambulatory Visit
Admission: RE | Admit: 2013-11-15 | Discharge: 2013-11-15 | Disposition: A | Discharge status: Home / Self Care | Payer: Commercial Managed Care - HMO | Source: Ambulatory Visit | Attending: Internal Medicine | Admitting: Internal Medicine

## 2013-11-15 VITALS — BP 124/78 | HR 67 | Temp 97.8°F | Resp 16 | Ht 62.0 in | Wt 214.0 lb

## 2013-11-15 DIAGNOSIS — E278 Other specified disorders of adrenal gland: Secondary | ICD-10-CM

## 2013-11-15 DIAGNOSIS — R0609 Other forms of dyspnea: Secondary | ICD-10-CM

## 2013-11-15 DIAGNOSIS — I10 Essential (primary) hypertension: Secondary | ICD-10-CM

## 2013-11-15 DIAGNOSIS — E042 Nontoxic multinodular goiter: Secondary | ICD-10-CM

## 2013-11-15 NOTE — Progress Notes (Signed)
Pre visit review using our clinic review tool, if applicable. No additional management support is needed unless otherwise documented below in the visit note. 

## 2013-11-15 NOTE — Patient Instructions (Signed)

## 2013-11-15 NOTE — Progress Notes (Signed)
Subjective:    Patient ID: TAYRA DAWE, female    DOB: [birth year], 79 y.o.   MRN: [id number]  Hypertension This is a chronic problem. The current episode started more than 1 year ago. The problem is unchanged. The problem is controlled. Associated symptoms include anxiety and shortness of breath. Pertinent negatives include no blurred vision, chest pain, headaches, malaise/fatigue, neck pain, orthopnea, palpitations, peripheral edema, PND or sweats. Past treatments include ACE inhibitors, diuretics and beta blockers. The current treatment provides moderate improvement. Compliance problems include exercise and diet.       Review of Systems  Constitutional: Negative.  Negative for fever, chills, malaise/fatigue, diaphoresis, appetite change and fatigue.  HENT: Negative.   Eyes: Negative.  Negative for blurred vision.  Respiratory: Positive for shortness of breath. Negative for apnea, cough, choking, chest tightness, wheezing and stridor.   Cardiovascular: Negative.  Negative for chest pain, palpitations, orthopnea, leg swelling and PND.  Gastrointestinal: Negative.  Negative for nausea, vomiting, abdominal pain, diarrhea, constipation and blood in stool.  Endocrine: Negative.   Genitourinary: Negative.   Musculoskeletal: Negative.  Negative for neck pain.  Skin: Negative.   Allergic/Immunologic: Negative.   Neurological: Negative.  Negative for dizziness and headaches.  Hematological: Negative.   Psychiatric/Behavioral: Negative.        Objective:   Physical Exam  Vitals reviewed. Constitutional: She is oriented to person, place, and time. She appears well-developed and well-nourished. No distress.  HENT:  Head: Normocephalic and atraumatic.  Mouth/Throat: Oropharynx is clear and moist. No oropharyngeal exudate.  Eyes: Conjunctivae are normal. Right eye exhibits no discharge. Left eye exhibits no discharge. No scleral icterus.  Neck: Normal range of motion. Neck supple. No JVD  present. No tracheal deviation present. No thyromegaly present.  Cardiovascular: Normal rate, regular rhythm, S1 normal, S2 normal and intact distal pulses.  Exam reveals no gallop and no friction rub.   Murmur heard.  Decrescendo systolic murmur is present with a grade of 1/6   No diastolic murmur is present  Pulses:      Carotid pulses are 1+ on the right side, and 1+ on the left side.      Radial pulses are 1+ on the right side, and 1+ on the left side.       Femoral pulses are 1+ on the right side, and 1+ on the left side.      Popliteal pulses are 1+ on the right side, and 1+ on the left side.       Dorsalis pedis pulses are 1+ on the right side, and 1+ on the left side.       Posterior tibial pulses are 1+ on the right side, and 1+ on the left side.  Pulmonary/Chest: Effort normal and breath sounds normal. No stridor. No respiratory distress. She has no wheezes. She has no rales. She exhibits no tenderness.  Abdominal: Soft. Bowel sounds are normal. She exhibits no distension and no mass. There is no tenderness. There is no rebound and no guarding.  Musculoskeletal: Normal range of motion. She exhibits no edema and no tenderness.  Lymphadenopathy:    She has no cervical adenopathy.  Neurological: She is oriented to person, place, and time.  Skin: Skin is warm and dry. No rash noted. She is not diaphoretic. No erythema. No pallor.  Psychiatric: She has a normal mood and affect. Her behavior is normal. Judgment and thought content normal.     Lab Results  Component Value Date   WBC  7.5 10/27/2013   HGB 12.6 10/27/2013   HCT 39.1 10/27/2013   PLT 128* 10/27/2013   GLUCOSE 105* 10/27/2013   CHOL 173 12/24/2012   TRIG 102.0 12/24/2012   HDL 67.60 12/24/2012   LDLDIRECT 150.1 04/15/2011   LDLCALC 85 12/24/2012   ALT 12 10/27/2013   AST 19 10/27/2013   NA 139 10/27/2013   K 3.5 10/27/2013   CL 100 10/27/2013   CREATININE 0.55 10/27/2013   BUN 8 10/27/2013   CO2 29 10/27/2013   TSH 1.06  11/04/2013   INR 0.97 10/28/2013   HGBA1C 5.5 12/24/2012       Assessment & Plan:

## 2013-11-16 ENCOUNTER — Encounter: Payer: Self-pay | Admitting: Internal Medicine

## 2013-11-16 NOTE — Assessment & Plan Note (Signed)
She was admitted a few weeks ago for this and a complete and thorough evaluation was negative for any cardiopulmonary disease I spoke to the pt and her daughter today about deconditioning and have asked her to start a daily, low impact exercise program

## 2013-11-16 NOTE — Assessment & Plan Note (Signed)
Her BP is well controlled 

## 2013-11-16 NOTE — Assessment & Plan Note (Signed)
This is being evaluated by ENDO

## 2013-11-22 ENCOUNTER — Telehealth: Payer: Self-pay

## 2013-11-22 NOTE — Telephone Encounter (Signed)
Pt called requesting results of thyroid ultra sound. Pt informed of results. Pt wanted Dr to know she has been having a lot of fatigue.  Wanted to know if anything could be done. Please advise,  Thanks!

## 2013-11-22 NOTE — Telephone Encounter (Signed)
From the thyroid point of view, there is nothing to be done now, but she has to be back in ~ 1 mo for repeat labs and will see from there. Labs are in.

## 2013-11-22 NOTE — Telephone Encounter (Signed)
Pt informed

## 2013-11-25 ENCOUNTER — Other Ambulatory Visit: Payer: Self-pay | Admitting: Internal Medicine

## 2013-12-24 ENCOUNTER — Other Ambulatory Visit (INDEPENDENT_AMBULATORY_CARE_PROVIDER_SITE_OTHER): Payer: Medicare HMO

## 2013-12-24 DIAGNOSIS — R7989 Other specified abnormal findings of blood chemistry: Secondary | ICD-10-CM

## 2013-12-24 DIAGNOSIS — R946 Abnormal results of thyroid function studies: Secondary | ICD-10-CM

## 2013-12-24 LAB — T4, FREE: Free T4: 0.61 ng/dL (ref 0.60–1.60)

## 2013-12-24 LAB — T3, FREE: T3 FREE: 2.8 pg/mL (ref 2.3–4.2)

## 2013-12-24 LAB — TSH: TSH: 1.79 u[IU]/mL (ref 0.35–5.50)

## 2014-01-04 ENCOUNTER — Other Ambulatory Visit: Payer: Self-pay | Admitting: Internal Medicine

## 2014-01-04 NOTE — Telephone Encounter (Signed)
Refill request for phenergan Last filled by MD on - 02/24/13 #30 #2 Last Appt: 11/15/13 Next Appt: 02/21/14 Please advise refill?

## 2014-01-13 ENCOUNTER — Other Ambulatory Visit: Payer: Self-pay | Admitting: Internal Medicine

## 2014-02-02 ENCOUNTER — Other Ambulatory Visit: Payer: Self-pay | Admitting: *Deleted

## 2014-02-02 NOTE — Telephone Encounter (Signed)
Pt is not an [facility] pt

## 2014-02-04 ENCOUNTER — Telehealth: Payer: Self-pay

## 2014-02-04 MED ORDER — ISOSORB DINITRATE-HYDRALAZINE 20-37.5 MG PO TABS: 0.5000 | ORAL_TABLET | Freq: Two times a day (BID) | ORAL | Status: DC

## 2014-02-04 NOTE — Telephone Encounter (Signed)
Received a rx request for bidil 20-37.5 mg , pt was prescribed med from another dr on 10/29/2013.  Do you want this med refilled?

## 2014-02-04 NOTE — Telephone Encounter (Signed)
yes

## 2014-02-07 NOTE — Telephone Encounter (Signed)
Medication refilled to pharmacy.  

## 2014-02-14 ENCOUNTER — Ambulatory Visit: Payer: Medicare HMO | Admitting: Internal Medicine

## 2014-02-21 ENCOUNTER — Encounter: Payer: Self-pay | Admitting: Internal Medicine

## 2014-02-21 ENCOUNTER — Ambulatory Visit (INDEPENDENT_AMBULATORY_CARE_PROVIDER_SITE_OTHER): Payer: Medicare HMO | Admitting: Internal Medicine

## 2014-02-21 ENCOUNTER — Ambulatory Visit: Payer: Medicare HMO | Admitting: Internal Medicine

## 2014-02-21 ENCOUNTER — Other Ambulatory Visit (INDEPENDENT_AMBULATORY_CARE_PROVIDER_SITE_OTHER): Payer: Commercial Managed Care - HMO

## 2014-02-21 VITALS — BP 118/90 | HR 66 | Temp 97.8°F | Resp 16 | Ht 62.0 in | Wt 203.0 lb

## 2014-02-21 DIAGNOSIS — R7309 Other abnormal glucose: Secondary | ICD-10-CM

## 2014-02-21 DIAGNOSIS — I10 Essential (primary) hypertension: Secondary | ICD-10-CM

## 2014-02-21 DIAGNOSIS — M1990 Unspecified osteoarthritis, unspecified site: Secondary | ICD-10-CM

## 2014-02-21 DIAGNOSIS — R739 Hyperglycemia, unspecified: Secondary | ICD-10-CM | POA: Insufficient documentation

## 2014-02-21 LAB — BASIC METABOLIC PANEL
BUN: 10 mg/dL (ref 6–23)
CO2: 32 meq/L (ref 19–32)
CREATININE: 0.7 mg/dL (ref 0.4–1.2)
Calcium: 9.5 mg/dL (ref 8.4–10.5)
Chloride: 103 mEq/L (ref 96–112)
GFR: 109.19 mL/min (ref >60.00)
GLUCOSE: 90 mg/dL (ref 70–99)
POTASSIUM: 4.1 meq/L (ref 3.5–5.1)
Sodium: 138 mEq/L (ref 135–145)

## 2014-02-21 LAB — HEMOGLOBIN A1C: HEMOGLOBIN A1C: 5.5 % (ref 4.6–6.5)

## 2014-02-21 MED ORDER — LOSARTAN POTASSIUM 100 MG PO TABS: 100.0000 mg | ORAL_TABLET | Freq: Every day | ORAL | Status: DC

## 2014-02-21 MED ORDER — TRAMADOL HCL 50 MG PO TABS: 50.0000 mg | ORAL_TABLET | Freq: Three times a day (TID) | ORAL | Status: DC | PRN

## 2014-02-21 MED ORDER — GABAPENTIN 100 MG PO CAPS: 100.0000 mg | ORAL_CAPSULE | Freq: Three times a day (TID) | ORAL | Status: DC

## 2014-02-21 NOTE — Patient Instructions (Signed)

## 2014-02-21 NOTE — Progress Notes (Signed)
Subjective:    Patient ID: [name], female    DOB: Jun 29, 1933, 80 y.o.   MRN: [id number]  Arthritis Presents for follow-up visit. The disease course has been worsening. She complains of pain. She reports no stiffness, joint swelling or joint warmth. Affected locations include the right knee and left knee. Her pain is at a severity of 4/10. Associated symptoms include pain at night and pain while resting. Pertinent negatives include no diarrhea, dry eyes, dry mouth, dysuria, fatigue, fever, rash, Raynaud's syndrome, uveitis or weight loss. Past treatments include acetaminophen. The treatment provided mild relief. Factors aggravating her arthritis include activity. Compliance with prior treatments has been variable.      Review of Systems  Constitutional: Negative.  Negative for fever, chills, weight loss, diaphoresis, appetite change and fatigue.  HENT: Negative.   Eyes: Negative.   Respiratory: Positive for cough. Negative for apnea, choking, chest tightness, shortness of breath, wheezing and stridor.        She complains of a chronic NP cough at night and a tickle in her throat.  Cardiovascular: Negative.  Negative for chest pain, palpitations and leg swelling.  Gastrointestinal: Negative.  Negative for abdominal pain and diarrhea.  Endocrine: Negative.   Genitourinary: Negative.  Negative for dysuria.  Musculoskeletal: Positive for arthralgias and arthritis. Negative for back pain, gait problem, joint swelling, myalgias, neck pain, neck stiffness and stiffness.  Skin: Negative.  Negative for color change, pallor, rash and wound.  Allergic/Immunologic: Negative.   Neurological: Negative.  Negative for dizziness, tremors, weakness and light-headedness.  Hematological: Negative.  Negative for adenopathy. Does not bruise/bleed easily.  Psychiatric/Behavioral: Negative.        Objective:   Physical Exam  Vitals reviewed. Constitutional: She is oriented to person, place, and time.  She appears well-developed and well-nourished. No distress.  HENT:  Head: Normocephalic and atraumatic.  Mouth/Throat: Oropharynx is clear and moist. No oropharyngeal exudate.  Eyes: Conjunctivae are normal. Right eye exhibits no discharge. Left eye exhibits no discharge. No scleral icterus.  Neck: Normal range of motion. Neck supple. No JVD present. No tracheal deviation present. No thyromegaly present.  Cardiovascular: Normal rate, regular rhythm and intact distal pulses.  Exam reveals no gallop and no friction rub.   Murmur heard. Pulmonary/Chest: Effort normal and breath sounds normal. No stridor. No respiratory distress. She has no wheezes. She has no rales. She exhibits no tenderness.  Abdominal: Soft. Bowel sounds are normal. She exhibits no distension and no mass. There is no tenderness. There is no rebound and no guarding.  Musculoskeletal: Normal range of motion. She exhibits no edema and no tenderness.  Lymphadenopathy:    She has no cervical adenopathy.  Neurological: She is oriented to person, place, and time.  Skin: Skin is warm and dry. No rash noted. She is not diaphoretic. No erythema. No pallor.     Lab Results  Component Value Date   WBC 7.5 10/27/2013   HGB 12.6 10/27/2013   HCT 39.1 10/27/2013   PLT 128* 10/27/2013   GLUCOSE 105* 10/27/2013   CHOL 173 12/24/2012   TRIG 102.0 12/24/2012   HDL 67.60 12/24/2012   LDLDIRECT 150.1 04/15/2011   LDLCALC 85 12/24/2012   ALT 12 10/27/2013   AST 19 10/27/2013   NA 139 10/27/2013   K 3.5 10/27/2013   CL 100 10/27/2013   CREATININE 0.55 10/27/2013   BUN 8 10/27/2013   CO2 29 10/27/2013   TSH 1.79 12/24/2013   INR 0.97 10/28/2013  HGBA1C 5.5 12/24/2012       Assessment & Plan:

## 2014-02-21 NOTE — Progress Notes (Signed)
Pre visit review using our clinic review tool, if applicable. No additional management support is needed unless otherwise documented below in the visit note. 

## 2014-02-22 ENCOUNTER — Encounter: Payer: Self-pay | Admitting: Internal Medicine

## 2014-02-22 NOTE — Assessment & Plan Note (Signed)
She will try tramadol and neurontin as needed for pain

## 2014-02-22 NOTE — Assessment & Plan Note (Addendum)
Her DBP is still high and the ACEI has caused a cough so that will be stopped I have asked her to start an ARB, she will cont the other meds I will monitor her lytes and renal function today

## 2014-02-22 NOTE — Assessment & Plan Note (Signed)
I will check her A1C to see if she has developed DM2 

## 2014-03-14 ENCOUNTER — Other Ambulatory Visit: Payer: Self-pay | Admitting: Internal Medicine

## 2014-04-06 ENCOUNTER — Encounter: Payer: Self-pay | Admitting: Pulmonary Disease

## 2014-04-06 ENCOUNTER — Ambulatory Visit (INDEPENDENT_AMBULATORY_CARE_PROVIDER_SITE_OTHER): Payer: Commercial Managed Care - HMO | Admitting: Pulmonary Disease

## 2014-04-06 VITALS — BP 138/90 | HR 82 | Ht 62.0 in | Wt 192.0 lb

## 2014-04-06 DIAGNOSIS — IMO0002 Reserved for concepts with insufficient information to code with codable children: Secondary | ICD-10-CM

## 2014-04-06 DIAGNOSIS — G4733 Obstructive sleep apnea (adult) (pediatric): Secondary | ICD-10-CM

## 2014-04-06 DIAGNOSIS — G4739 Other sleep apnea: Secondary | ICD-10-CM

## 2014-04-06 DIAGNOSIS — G4736 Sleep related hypoventilation in conditions classified elsewhere: Secondary | ICD-10-CM

## 2014-04-06 NOTE — Assessment & Plan Note (Signed)
Will get copy of her CPAP download and arrange for ONO with CPAP and room air.  Will then determine if she can have her home oxygen set up d/c'ed.

## 2014-04-06 NOTE — Patient Instructions (Signed)
Will get CPAP report and arrange for overnight oxygen test >> will call with results Follow up in 1 year

## 2014-04-06 NOTE — Assessment & Plan Note (Signed)
Will arrange for ONO with CPAP and room air.  She is not currently using supplemental oxygen at night.

## 2014-04-06 NOTE — Progress Notes (Signed)
Chief Complaint  Patient presents with  . Follow-up    States she is currently on oxygen and doesn't feel she needs. Wants to discuss this with VS.    History of Present Illness: [name] is a 80 y.o. female with OSA/OHS.  She continues to use CPAP, and feels this helps.  She still sometimes wakes up feeling smothered, and has frequent dreams.    She has not been using oxygen at night.  She does not thinks this helps, and has not noticed any difference w/o oxygen at night.  She would like to have home oxygen set up removed.  TESTS: PSG 06/15/12>>AHI 5.7, SpO2 low 83%. Added 1 liter oxygen.  CPAP titration 07/29/12>>CPAP 10 cm H2O with 1 liter oxygen. CPAP download 09/19/13 to 10/18/13 >> Used on 12 of 30 nights with average 4 hrs 39 min. Average AHI 0.6 with CPAP 10 cm H2O. ONO with CPAP and 1 liter 10/19/13 >> Test time 7 hrs 32 min. Basal SpO2 93.8%, low SpO2 89%. Echo 10/28/13 >> mild LVH, EF 60 to 80%, grade 1 diastolic dysfx  [place]  has a past medical history of Hyperlipidemia; Hypertension; Osteoarthritis; Wears dentures; Bruises easily; Diarrhea; Abdominal pain; Wears glasses; Shortness of breath; Oxygen desaturation during sleep; Anxiety; Depression; Sleep apnea; Diabetes mellitus; Chronic headaches; Anemia; Hepatitis; Chronic diastolic CHF (congestive heart failure); and Mild aortic stenosis.  [name]  has past surgical history that includes Appendectomy; Cholecystectomy; Abdominal hysterectomy; Tonsillectomy; Revision total knee arthroplasty (2011); Rotator cuff repair (2010); Tonsillectomy; Joint replacement; Knee arthroscopy; and Shoulder arthroscopy (09/17/2012).  Prior to Admission medications   Medication Sig Start Date End Date Taking? Authorizing Provider  ALPRAZolam Duanne Moron) 1 MG tablet take 1 tablet by mouth three times a day if needed for sleep 09/13/13  Yes [name], MD  Cholecalciferol (VITAMIN D3) 1000 UNITS CAPS Take 2,000 Units by mouth daily.     Yes Historical Provider, MD  Enalapril-Hydrochlorothiazide 5-12.5 MG per tablet Take 2 tablets by mouth daily.   Yes Historical Provider, MD  HYDROcodone-acetaminophen (NORCO) 10-325 MG per tablet Take 1 tablet by mouth every 8 (eight) hours as needed for pain. 07/29/13  Yes [name], MD  meclizine (ANTIVERT) 25 MG tablet Take 1 tablet (25 mg total) by mouth 3 (three) times daily as needed. 07/14/13  Yes [name], MD  nebivolol (BYSTOLIC) 5 MG tablet Take 1 tablet (5 mg total) by mouth daily. 07/14/13  Yes [name], MD  potassium chloride SA (K-DUR,KLOR-CON) 20 MEQ tablet Take 1 tablet (20 mEq total) by mouth 2 (two) times daily. 07/14/13  Yes [name], MD  traZODone (DESYREL) 100 MG tablet take 1 tablet by mouth at bedtime 06/15/13  Yes [name], MD    Allergies  Allergen Reactions  . Amlodipine     dizziness  . Enalapril     cough  . Metformin Diarrhea    Diarrhea, IBS     Physical Exam:  General - No distress ENT - No sinus tenderness, no oral exudate, no LAN Cardiac - s1s2 regular, no murmur Chest - No wheeze/rales/dullness Back - No focal tenderness Abd - Soft, non-tender Ext - No edema Neuro - Normal strength Skin - No rashes Psych - normal mood, and behavior   Assessment/Plan:  Chesley Mires, MD [facility] Pager:  [phone]

## 2014-04-07 ENCOUNTER — Ambulatory Visit: Payer: Commercial Managed Care - HMO | Admitting: Pulmonary Disease

## 2014-04-26 ENCOUNTER — Telehealth: Payer: Self-pay | Admitting: Pulmonary Disease

## 2014-04-26 DIAGNOSIS — G4733 Obstructive sleep apnea (adult) (pediatric): Secondary | ICD-10-CM

## 2014-04-26 NOTE — Telephone Encounter (Signed)
I spoke with patient about results and she verbalized understanding and had no questions Order sent to [facility] Nothing further needed

## 2014-04-26 NOTE — Telephone Encounter (Signed)
Results received and placed in Dr. Juanetta Gosling look at. Please advise thanks

## 2014-04-26 NOTE — Telephone Encounter (Signed)
Called pt. She reports she had ONO done couple weeks ago. Was told by [facility] they faxed this week ago.  Looked in VS look at and did not see any results. I called [facility] to get these faxed over. Will await fax

## 2014-04-26 NOTE — Telephone Encounter (Signed)
ONO with CPAP and RA 04/12/14 >> test time 8 hrs 1 min.  Basal SpO2 93%, low SpO2 79%.  Spent 2.2 min with SpO2 < 88%.  Will have my nurse inform pt that ONO looked good.  She is to continue CPAP at night, but does not need supplemental oxygen.  Please send order to discontinue home oxygen set up.

## 2014-05-05 ENCOUNTER — Ambulatory Visit (INDEPENDENT_AMBULATORY_CARE_PROVIDER_SITE_OTHER): Payer: Commercial Managed Care - HMO | Admitting: Internal Medicine

## 2014-05-05 VITALS — BP 128/68 | HR 65 | Temp 98.3°F | Resp 12 | Wt 190.0 lb

## 2014-05-05 DIAGNOSIS — E278 Other specified disorders of adrenal gland: Secondary | ICD-10-CM

## 2014-05-05 DIAGNOSIS — E063 Autoimmune thyroiditis: Secondary | ICD-10-CM | POA: Insufficient documentation

## 2014-05-05 DIAGNOSIS — R1310 Dysphagia, unspecified: Secondary | ICD-10-CM

## 2014-05-05 NOTE — Progress Notes (Signed)
Patient ID: [name], female   DOB: 01-29-1933, 80 y.o.   MRN: [id number]   HPI  [name] is a 80 y.o.-year-old female, returning for f/u for an adrenal incidentaloma and Hashimoto's hypothyroidism.   Pt has a new dx Hashimoto's thyroiditis. Reviewed recent TFTs: Lab Results  Component Value Date   TSH 1.79 12/24/2013   TSH 1.06 11/04/2013   TSH 5.235* 10/27/2013   TSH 7.83* 10/26/2013   TSH 3.50 12/24/2012   FREET4 0.61 12/24/2013   FREET4 0.47* 11/04/2013   FREET4 0.65* 10/28/2013   Component     Latest Ref Rng 11/04/2013  Thyroid Peroxidase Antibody     <35.0 IU/mL 1001.0 (H)   On her angio chest CT, she was found to have enlarged thyroid gland with nonspecific rounded calcification along the isthmus.   No significant neck compression sxs: but she admits for worsening dysphagia, has a tickle in the throat especially at night; hoarseness and feels lumps in neck.   She c/o fatigue, constipated, weight loss (after diuresis in the hospital), no heat or cold intolerance.   Adrenal incidentaloma: CT scan: Indeterminate 2.1 cm right adrenal nodule (39 HU).   She does have a h/o HTN, recently in the hospital, sBP in the 200. She has had HTN x at least 10 years.She is on Lasix, Enalapril, Hydralazine, Isosorbide,bystolic, HCTZ.   No h/o AMI, strokes. She had low K in the past and is on supplements. She has OSA >> wears it every night.   She does not have DM.  Lab Results  Component Value Date   HGBA1C 5.5 02/21/2014   Hormonal w/u was negative for Cushing's sd., primary aldosteronism or pheochromocytoma in 10/2013.  ROS: Constitutional: + weight loss, decreased appetite, + fatigue,  + poor sleep Eyes: no blurry vision, no xerophthalmia ENT: no sore throat, + nodules palpated in throat, + dysphagia/no odynophagia, + hoarseness Cardiovascular: no CP/SOB/no palpitations/leg swelling Respiratory: + cough/no SOB Gastrointestinal: no N/no V/+ D/+ C Musculoskeletal: no muscle/  joint aches Skin: no rashes Neurological: no tremors/numbness/tingling/dizziness  PE: BP 128/68  Pulse 65  Temp(Src) 98.3 F (36.8 C) (Oral)  Resp 12  Wt 190 lb (86.183 kg)  SpO2 95% Wt Readings from Last 3 Encounters:  05/05/14 190 lb (86.183 kg)  04/06/14 192 lb (87.091 kg)  02/21/14 203 lb (92.08 kg)   Constitutional: overweight, in NAD Eyes: PERRLA, EOMI, no exophthalmos, no lid lag, no stare ENT: moist mucous membranes, no thyromegaly - no nodules felt, no cervical lymphadenopathy Cardiovascular: RRR, No MRG Respiratory: CTA B Gastrointestinal: abdomen soft, NT, ND, BS+ Musculoskeletal: no deformities, strength intact in all 4 Skin: moist, warm, no rashes Neurological: no tremor with outstretched hands, DTR normal in all 4  ASSESSMENT: 1. Adrenal incidentaloma - CT chest (10/26/2013): R, 2.1 cm, 30 HU -  Hormonal w/u (10/2013): negative for Cushing's sd., primary aldosteronism, or pheochromocytoma in 10/2013.  2. Hashimoto's thyroiditis   3. MNG - Thyroid U/S (11/15/2014):  - Right thyroid lobe: 6.5 x 3.6 x 3.5 cm. There is 0.8 x 0.7 x 0.9 cm hypoechoic nodule in the mid right lobe. Remainder the right lobe is inhomogeneous and enlarged. - Left thyroid lobe: 6.6 x 3.8 x 3.6 cm. There is a hyperechoic solid 1.2 x 1.0 x 1.2 cm nodule in the left upper pole. There is a 4 mm cyst in the mid left lobe. - Isthmus Thickness: Calcified 0.7 cm nodule in the right side of the isthmus which correlates with the  calcification on CT scan. No nodules visualized. - Lymphadenopathy None visualized. IMPRESSION: Multinodular goiter. None of the visualized nodules fit criteria for fine needle aspiration biopsy at this time.  4. Dysphagia - not new, but worsening   PLAN:  1. Patient with a 2.1 cm R adrenal nodule discovered incidentally during hospitalization for SOB + fluid retention. The nodule is 39 HU on CT. - To differentiate between a functioning and a nonfunctioning adrenal  nodule, we ruled out hypersecretion by checking the following tests (all negative) - dexamethasone suppression test to rule out Cushing syndrome (6% of adrenal incidentalomas)  - Plasma fractionated metanephrines and catecholamines to rule out pheochromocytoma (3% of adrenal incidentalomas) - Plasma renin activity and aldosterone level to rule out primary hyperaldosteronism (0.6% of adrenal incidentalomas) - need a new CT scan in 6 mo - will have her back for repeat hormonal testing and ordering a dedicated adrenal CT then (I will need to order it with and without contrast, if the Hounsfield units are low and the lesion again appears benign, we might not need to get the contrasted CT for washout). - will plan for:   hormonal testing yearly for 5 years   CT scans yearly x1-2 - I will see her back in 6 mo  2. Hypothyroidism - has high TPO Ab titer - last TFT set normal - will recheck in 6 mo  3. MNG - reviewed the images of her thyroid U/S along with the pt >> nodules small and unlikely to cause compression - we will likely need a repeat U/S at 1-2 years since the previous  4. Dysphagia - Pt bothered by this and is wondering if this could be caused by her goiter.  - we decided to check a Barium swallow and check for internal vs external compression  Ba swallowing pending.

## 2014-05-13 ENCOUNTER — Encounter: Payer: Self-pay | Admitting: Internal Medicine

## 2014-05-19 ENCOUNTER — Emergency Department (HOSPITAL_COMMUNITY): Payer: Medicare HMO

## 2014-05-19 ENCOUNTER — Emergency Department (HOSPITAL_COMMUNITY)
Admission: EM | Admit: 2014-05-19 | Discharge: 2014-05-20 | Disposition: A | Discharge status: Home / Self Care | Payer: Medicare HMO | Attending: Emergency Medicine | Admitting: Emergency Medicine

## 2014-05-19 ENCOUNTER — Encounter (HOSPITAL_COMMUNITY): Payer: Self-pay | Admitting: Emergency Medicine

## 2014-05-19 DIAGNOSIS — F3289 Other specified depressive episodes: Secondary | ICD-10-CM | POA: Insufficient documentation

## 2014-05-19 DIAGNOSIS — Y9289 Other specified places as the place of occurrence of the external cause: Secondary | ICD-10-CM | POA: Insufficient documentation

## 2014-05-19 DIAGNOSIS — E119 Type 2 diabetes mellitus without complications: Secondary | ICD-10-CM | POA: Insufficient documentation

## 2014-05-19 DIAGNOSIS — M1990 Unspecified osteoarthritis, unspecified site: Secondary | ICD-10-CM | POA: Insufficient documentation

## 2014-05-19 DIAGNOSIS — F329 Major depressive disorder, single episode, unspecified: Secondary | ICD-10-CM | POA: Insufficient documentation

## 2014-05-19 DIAGNOSIS — Z98811 Dental restoration status: Secondary | ICD-10-CM | POA: Insufficient documentation

## 2014-05-19 DIAGNOSIS — S59909A Unspecified injury of unspecified elbow, initial encounter: Secondary | ICD-10-CM | POA: Insufficient documentation

## 2014-05-19 DIAGNOSIS — F411 Generalized anxiety disorder: Secondary | ICD-10-CM | POA: Insufficient documentation

## 2014-05-19 DIAGNOSIS — G8929 Other chronic pain: Secondary | ICD-10-CM | POA: Insufficient documentation

## 2014-05-19 DIAGNOSIS — Z862 Personal history of diseases of the blood and blood-forming organs and certain disorders involving the immune mechanism: Secondary | ICD-10-CM | POA: Insufficient documentation

## 2014-05-19 DIAGNOSIS — R0902 Hypoxemia: Secondary | ICD-10-CM | POA: Insufficient documentation

## 2014-05-19 DIAGNOSIS — S46909A Unspecified injury of unspecified muscle, fascia and tendon at shoulder and upper arm level, unspecified arm, initial encounter: Secondary | ICD-10-CM | POA: Insufficient documentation

## 2014-05-19 DIAGNOSIS — W19XXXA Unspecified fall, initial encounter: Secondary | ICD-10-CM

## 2014-05-19 DIAGNOSIS — S3981XA Other specified injuries of abdomen, initial encounter: Secondary | ICD-10-CM | POA: Insufficient documentation

## 2014-05-19 DIAGNOSIS — Z7982 Long term (current) use of aspirin: Secondary | ICD-10-CM | POA: Insufficient documentation

## 2014-05-19 DIAGNOSIS — E785 Hyperlipidemia, unspecified: Secondary | ICD-10-CM | POA: Insufficient documentation

## 2014-05-19 DIAGNOSIS — I5032 Chronic diastolic (congestive) heart failure: Secondary | ICD-10-CM | POA: Insufficient documentation

## 2014-05-19 DIAGNOSIS — Y9389 Activity, other specified: Secondary | ICD-10-CM | POA: Insufficient documentation

## 2014-05-19 DIAGNOSIS — S59919A Unspecified injury of unspecified forearm, initial encounter: Secondary | ICD-10-CM

## 2014-05-19 DIAGNOSIS — S4980XA Other specified injuries of shoulder and upper arm, unspecified arm, initial encounter: Secondary | ICD-10-CM | POA: Insufficient documentation

## 2014-05-19 DIAGNOSIS — R1011 Right upper quadrant pain: Secondary | ICD-10-CM | POA: Insufficient documentation

## 2014-05-19 DIAGNOSIS — Z9981 Dependence on supplemental oxygen: Secondary | ICD-10-CM | POA: Insufficient documentation

## 2014-05-19 DIAGNOSIS — S6990XA Unspecified injury of unspecified wrist, hand and finger(s), initial encounter: Secondary | ICD-10-CM

## 2014-05-19 DIAGNOSIS — Z79899 Other long term (current) drug therapy: Secondary | ICD-10-CM | POA: Insufficient documentation

## 2014-05-19 DIAGNOSIS — G4730 Sleep apnea, unspecified: Secondary | ICD-10-CM | POA: Insufficient documentation

## 2014-05-19 DIAGNOSIS — R296 Repeated falls: Secondary | ICD-10-CM | POA: Insufficient documentation

## 2014-05-19 DIAGNOSIS — S2249XA Multiple fractures of ribs, unspecified side, initial encounter for closed fracture: Secondary | ICD-10-CM | POA: Insufficient documentation

## 2014-05-19 DIAGNOSIS — S2231XA Fracture of one rib, right side, initial encounter for closed fracture: Secondary | ICD-10-CM

## 2014-05-19 LAB — COMPREHENSIVE METABOLIC PANEL
ALT: 10 U/L (ref 0–35)
AST: 17 U/L (ref 0–37)
Albumin: 3.7 g/dL (ref 3.5–5.2)
Alkaline Phosphatase: 61 U/L (ref 39–117)
BUN: 11 mg/dL (ref 6–23)
CALCIUM: 9.3 mg/dL (ref 8.4–10.5)
CO2: 25 mEq/L (ref 19–32)
CREATININE: 0.53 mg/dL (ref 0.50–1.10)
Chloride: 104 mEq/L (ref 96–112)
GFR calc Af Amer: >90 mL/min (ref >90)
GFR, EST NON AFRICAN AMERICAN: 88 mL/min — AB (ref >90)
Glucose, Bld: 86 mg/dL (ref 70–99)
Potassium: 3.8 mEq/L (ref 3.7–5.3)
Sodium: 141 mEq/L (ref 137–147)
TOTAL PROTEIN: 6.5 g/dL (ref 6.0–8.3)
Total Bilirubin: 1 mg/dL (ref 0.3–1.2)

## 2014-05-19 LAB — CBC
HCT: 38.4 % (ref 36.0–46.0)
Hemoglobin: 12.6 g/dL (ref 12.0–15.0)
MCH: 30.1 pg (ref 26.0–34.0)
MCHC: 32.8 g/dL (ref 30.0–36.0)
MCV: 91.6 fL (ref 78.0–100.0)
PLATELETS: 153 10*3/uL (ref 150–400)
RBC: 4.19 MIL/uL (ref 3.87–5.11)
RDW: 12.9 % (ref 11.5–15.5)
WBC: 8 10*3/uL (ref 4.0–10.5)

## 2014-05-19 LAB — I-STAT CREATININE, ED: Creatinine, Ser: 0.6 mg/dL (ref 0.50–1.10)

## 2014-05-19 MED ORDER — SODIUM CHLORIDE 0.9 % IV BOLUS (SEPSIS)
1000.0000 mL | Freq: Once | INTRAVENOUS | Status: AC
  Administered 2014-05-19: 1000 mL via INTRAVENOUS

## 2014-05-19 MED ORDER — IOHEXOL 300 MG/ML  SOLN
100.0000 mL | Freq: Once | INTRAMUSCULAR | Status: AC | PRN
  Administered 2014-05-19: 100 mL via INTRAVENOUS

## 2014-05-19 MED ORDER — ONDANSETRON HCL 4 MG/2ML IJ SOLN
4.0000 mg | Freq: Once | INTRAMUSCULAR | Status: AC
  Administered 2014-05-19: 4 mg via INTRAVENOUS
  Filled 2014-05-19: qty 2

## 2014-05-19 MED ORDER — MORPHINE SULFATE 4 MG/ML IJ SOLN
4.0000 mg | Freq: Once | INTRAMUSCULAR | Status: AC
  Administered 2014-05-19: 4 mg via INTRAVENOUS
  Filled 2014-05-19: qty 1

## 2014-05-19 NOTE — ED Notes (Signed)
Patient accompanied by her daughter. Patient states she fell while taking out some garbage. Patient c/o R sided arm, shoulder, and rib pain.

## 2014-05-19 NOTE — ED Notes (Signed)
Patient transported to X-ray 

## 2014-05-19 NOTE — ED Provider Notes (Signed)
CSN: [id number]     Arrival date & time 05/19/14  2047 History   First MD Initiated Contact with Patient 05/19/14 2141     Chief Complaint  Patient presents with  . Fall    R arm/shoulder, rib area pain     (Consider location/radiation/quality/duration/timing/severity/associated sxs/prior Treatment) HPI 80 year old female presents after a fall earlier tonight. She was moving her garbage can in the driveway and feels like it lost control and she fell. She lay on her right side. She has pain in her right elbow, right shoulder, and right ribs. It is hurting to breathe or move. Denies specific abdominal pain. No vomiting. Did not hit her head or neck. Pain is currently severe.  Past Medical History  Diagnosis Date  . Hyperlipidemia   . Hypertension   . Osteoarthritis   . Wears dentures   . Bruises easily   . Diarrhea   . Abdominal pain   . Wears glasses   . Shortness of breath     WITH EXERTION USES 2 L O2 BEDTIME  . Oxygen desaturation during sleep     USES 2 LITERS BEDTIME   VIA CPAP 06/2012 [facility]   . Anxiety   . Depression   . Sleep apnea     CPAP WITH O2 2 LITERS 2013 ([facility])  . Diabetes mellitus     type II  DIET CONTROLLED  . Chronic headaches     HISTORY OF   . Anemia     NOS  . Hepatitis     AGE 30S  . Chronic diastolic CHF (congestive heart failure)     a. 06/2013 EF 65-70%.  . Mild aortic stenosis     a. 06/2013 Echo: EF 65-70%, mod LVH with focal basal hypertrophy, very mild AS.   Past Surgical History  Procedure Laterality Date  . Appendectomy    . Cholecystectomy    . Abdominal hysterectomy    . Tonsillectomy    . Revision total knee arthroplasty  2011  . Rotator cuff repair  2010  . Tonsillectomy    . Joint replacement      LEFT KNEE   . Knee arthroscopy      RIGHT  . Shoulder arthroscopy  09/17/2012    Procedure: ARTHROSCOPY SHOULDER;  Surgeon: Sharmon Revere, MD;  Location: [facility];  Service: Orthopedics;  Laterality: Left;   Family History    Problem Relation Age of Onset  . Alcohol abuse Other   . Hypertension Other   . Kidney disease Other   . Mental illness Other   . Cancer Father     prostate cancer  . Heart disease Sister   . Heart disease Brother   . Emphysema Brother    History  Substance Use Topics  . Smoking status: Never Smoker   . Smokeless tobacco: Never Used  . Alcohol Use: No   OB History   Grav Para Term Preterm Abortions TAB SAB Ect Mult Living                 Review of Systems  Respiratory: Negative for shortness of breath.   Cardiovascular: Positive for chest pain.  Gastrointestinal: Negative for vomiting.  Neurological: Negative for weakness and numbness.  All other systems reviewed and are negative.     Allergies  Amlodipine; Enalapril; and Metformin  Home Medications   Prior to Admission medications   Medication Sig Start Date End Date Taking? Authorizing Provider  ALPRAZolam Duanne Moron) 1 MG tablet take  1 tablet by mouth three times a day if needed for sleep   Yes [name], MD  aspirin 81 MG chewable tablet Chew 1 tablet (81 mg total) by mouth daily. 10/29/13  Yes Barton Dubois, MD  Cholecalciferol (VITAMIN D3) 1000 UNITS CAPS Take 2,000 Units by mouth daily.    Yes Historical Provider, MD  furosemide (LASIX) 40 MG tablet take 1 tablet by mouth once daily 11/25/13  Yes [name], MD  gabapentin (NEURONTIN) 100 MG capsule Take 1 capsule (100 mg total) by mouth 3 (three) times daily. 02/21/14  Yes [name], MD  HYDROcodone-acetaminophen (NORCO) 10-325 MG per tablet Take 1 tablet by mouth every 6 (six) hours as needed for moderate pain.  05/02/14  Yes Historical Provider, MD  isosorbide-hydrALAZINE (BIDIL) 20-37.5 MG per tablet Take 0.5 tablets by mouth 2 (two) times daily. 02/04/14  Yes [name], MD  losartan (COZAAR) 100 MG tablet Take 1 tablet (100 mg total) by mouth daily. 02/21/14  Yes [name], MD  meclizine (ANTIVERT) 25 MG tablet Take 25 mg by mouth 3 (three)  times daily as needed for dizziness.   Yes Historical Provider, MD  Multiple Minerals-Vitamins (CALCIUM & VIT D3 BONE HEALTH PO) Take 1 capsule by mouth daily.   Yes Historical Provider, MD  potassium chloride SA (K-DUR,KLOR-CON) 20 MEQ tablet Take 1 tablet (20 mEq total) by mouth 2 (two) times daily. 07/14/13  Yes [name], MD  pravastatin (PRAVACHOL) 80 MG tablet Take 80 mg by mouth daily.   Yes Historical Provider, MD  traZODone (DESYREL) 100 MG tablet Take 2 tablets (200 mg total) by mouth at bedtime. 10/26/13  Yes [name], MD   BP 170/64  Pulse 68  Temp(Src) 98.8 F (37.1 C) (Oral)  Resp 18  Ht 5\' 2"  (1.575 m)  Wt 188 lb (85.276 kg)  BMI 34.38 kg/m2  SpO2 95% Physical Exam  Nursing note and vitals reviewed. Constitutional: She is oriented to person, place, and time. She appears well-developed and well-nourished.  HENT:  Head: Normocephalic and atraumatic.  Right Ear: External ear normal.  Left Ear: External ear normal.  Nose: Nose normal.  Eyes: Right eye exhibits no discharge. Left eye exhibits no discharge.  Cardiovascular: Normal rate, regular rhythm and normal heart sounds.   Pulses:      Radial pulses are 2+ on the right side, and 2+ on the left side.  Pulmonary/Chest: Effort normal and breath sounds normal. She exhibits tenderness (right inferior cehst wall tenderness).  Abdominal: Soft. There is tenderness in the right upper quadrant.  Musculoskeletal:       Right shoulder: She exhibits tenderness. She exhibits no deformity.       Right elbow: She exhibits no swelling. Tenderness found.       Arms: Mild pain with ROM of right shoulder and right elbow  Neurological: She is alert and oriented to person, place, and time.  Skin: Skin is warm and dry.    ED Course  Procedures (including critical care time) Labs Review Labs Reviewed - No data to display  Imaging Review Dg Ribs Unilateral W/chest Right  05/19/2014   CLINICAL DATA:  Fall.  Right anterior  rib pain.  EXAM: RIGHT RIBS AND CHEST - 3+ VIEW  COMPARISON:  10/26/2013  FINDINGS: Tortuous thoracic aorta. Mildly enlarged cardiopericardial silhouette.  No pneumothorax or pleural effusion observed.  Old right second rib fracture, healed. Slight irregularities of the right anterior fifth and sixth  ribs could represent nondisplaced fractures.  IMPRESSION: 1. Slightly irregular right anterior fifth and sixth ribs may reflect nondisplaced fractures. 2. Old right second rib fracture, healed. 3. Mildly enlarged cardiopericardial silhouette.   Electronically Signed   By: Sherryl Barters M.D.   On: 05/19/2014 22:09   Dg Shoulder Right  05/19/2014   CLINICAL DATA:  Fall.  Right shoulder pain.  EXAM: RIGHT SHOULDER - 2+ VIEW  COMPARISON:  None.  FINDINGS: Spurring of the right humeral head. Mild spurring of the right glenoid. Degenerative AC joint spurring.  Subacromial morphology is type 2 (curved). No dislocation or fracture involving the right shoulder identified.  Healed deformity of the right second rib due to old fracture.  IMPRESSION: 1. No shoulder fracture or dislocation identified.   Electronically Signed   By: Sherryl Barters M.D.   On: 05/19/2014 22:01   Dg Forearm Right  05/19/2014   CLINICAL DATA:  Right proximal forearm pain and elbow pain after a fall today.  EXAM: RIGHT FOREARM - 2 VIEW  COMPARISON:  06/02/2006  FINDINGS: Vague lucent changes are demonstrated in the distal right radial shaft, unchanged since prior study and therefore likely benign. No evidence of acute fracture or dislocation in the radius or ulna. Degenerative changes demonstrated in the wrist and elbow.  IMPRESSION: No acute bony abnormalities.   Electronically Signed   By: Lucienne Capers M.D.   On: 05/19/2014 21:59     EKG Interpretation None      MDM   Final diagnoses:  Fall, initial encounter  Rib fractures, right, closed, initial encounter    Patient with mechanical fall. 2 rib fractures seen on chest xray,  no pneumothorax. Pain significantly improved with 4 mg morphine. Has mild RUQ tenderness, likely from ribs, but given age will get CT chest/abd/pelvis to r/o significant trauma. If her CT is ok and pain is controlled, plan is for discharge with pain control as she wants to try outpatient pain control. Otherwise would need admission for concerning findings. Care transferred to Dr. Doy Mince with CT pending.    Ephraim Hamburger, MD 05/20/14 [phone]

## 2014-05-20 MED ORDER — OXYCODONE-ACETAMINOPHEN 5-325 MG PO TABS: 1.0000 | ORAL_TABLET | ORAL | Status: DC | PRN

## 2014-05-20 MED ORDER — OXYCODONE-ACETAMINOPHEN 5-325 MG PO TABS
1.0000 | ORAL_TABLET | Freq: Once | ORAL | Status: AC
  Administered 2014-05-20: 1 via ORAL
  Filled 2014-05-20: qty 1

## 2014-05-20 NOTE — ED Provider Notes (Signed)
Care and assumed from Dr. Regenia Skeeter. CT does not show signs of traumatic injury. Does show biapical opacities. She does not have a history concerning for pneumonia. She was told about her pulmonary nodule and advised followup. She knows about her other incidental findings. She felt well and felt like she could tolerate outpatient management.  Clinical Impression: 1. Fall, initial encounter   2. Rib fractures, right, closed, initial encounter       Houston Siren III, MD 05/20/14 [phone]

## 2014-05-20 NOTE — ED Notes (Signed)
Pt. BP 191/84. Manually verified by RN and MD made aware.

## 2014-05-25 ENCOUNTER — Encounter: Payer: Self-pay | Admitting: Internal Medicine

## 2014-05-25 ENCOUNTER — Ambulatory Visit (INDEPENDENT_AMBULATORY_CARE_PROVIDER_SITE_OTHER): Payer: Commercial Managed Care - HMO | Admitting: Internal Medicine

## 2014-05-25 ENCOUNTER — Other Ambulatory Visit (INDEPENDENT_AMBULATORY_CARE_PROVIDER_SITE_OTHER): Payer: Commercial Managed Care - HMO

## 2014-05-25 VITALS — BP 136/80 | HR 74 | Temp 98.0°F | Resp 16 | Ht 62.0 in | Wt 188.8 lb

## 2014-05-25 DIAGNOSIS — M1990 Unspecified osteoarthritis, unspecified site: Secondary | ICD-10-CM

## 2014-05-25 DIAGNOSIS — E785 Hyperlipidemia, unspecified: Secondary | ICD-10-CM

## 2014-05-25 DIAGNOSIS — E559 Vitamin D deficiency, unspecified: Secondary | ICD-10-CM

## 2014-05-25 DIAGNOSIS — I10 Essential (primary) hypertension: Secondary | ICD-10-CM

## 2014-05-25 DIAGNOSIS — F411 Generalized anxiety disorder: Secondary | ICD-10-CM

## 2014-05-25 DIAGNOSIS — Z23 Encounter for immunization: Secondary | ICD-10-CM

## 2014-05-25 DIAGNOSIS — R911 Solitary pulmonary nodule: Secondary | ICD-10-CM

## 2014-05-25 DIAGNOSIS — E063 Autoimmune thyroiditis: Secondary | ICD-10-CM

## 2014-05-25 LAB — COMPREHENSIVE METABOLIC PANEL
ALBUMIN: 3.8 g/dL (ref 3.5–5.2)
ALK PHOS: 54 U/L (ref 39–117)
ALT: 11 U/L (ref 0–35)
AST: 15 U/L (ref 0–37)
BUN: 10 mg/dL (ref 6–23)
CO2: 31 mEq/L (ref 19–32)
CREATININE: 0.6 mg/dL (ref 0.4–1.2)
Calcium: 9.7 mg/dL (ref 8.4–10.5)
Chloride: 103 mEq/L (ref 96–112)
GFR: 131.49 mL/min (ref >60.00)
GLUCOSE: 95 mg/dL (ref 70–99)
POTASSIUM: 3.8 meq/L (ref 3.5–5.1)
Sodium: 139 mEq/L (ref 135–145)
Total Bilirubin: 0.8 mg/dL (ref 0.2–1.2)
Total Protein: 6.5 g/dL (ref 6.0–8.3)

## 2014-05-25 LAB — LIPID PANEL
CHOL/HDL RATIO: 3
CHOLESTEROL: 209 mg/dL — AB (ref 0–200)
HDL: 65.6 mg/dL (ref >39.00)
LDL Cholesterol: 116 mg/dL — ABNORMAL HIGH (ref 0–99)
NonHDL: 143.4
TRIGLYCERIDES: 137 mg/dL (ref 0.0–149.0)
VLDL: 27.4 mg/dL (ref 0.0–40.0)

## 2014-05-25 LAB — VITAMIN D 25 HYDROXY (VIT D DEFICIENCY, FRACTURES): VITD: 28.56 ng/mL

## 2014-05-25 LAB — TSH: TSH: 2.66 u[IU]/mL (ref 0.35–4.50)

## 2014-05-25 MED ORDER — HYDROCODONE-ACETAMINOPHEN 10-325 MG PO TABS: 1.0000 | ORAL_TABLET | Freq: Four times a day (QID) | ORAL | Status: DC | PRN

## 2014-05-25 MED ORDER — VITAMIN D3 25 MCG (1000 UT) PO CAPS: 2000.0000 [IU] | ORAL_CAPSULE | Freq: Every day | ORAL | Status: DC

## 2014-05-25 NOTE — Progress Notes (Signed)
Subjective:    Patient ID: [name], female    DOB: September 13, 1933, 80 y.o.   MRN: [id number]  Hyperlipidemia This is a chronic problem. The current episode started more than 1 year ago. The problem is controlled. Recent lipid tests were reviewed and are variable. Exacerbating diseases include obesity. She has no history of chronic renal disease, diabetes, hypothyroidism, liver disease or nephrotic syndrome. Pertinent negatives include no chest pain, focal sensory loss, focal weakness, leg pain, myalgias or shortness of breath. Current antihyperlipidemic treatment includes statins. The current treatment provides moderate improvement of lipids. Compliance problems include adherence to exercise and adherence to diet.       Review of Systems  Constitutional: Negative.  Negative for fever, chills, diaphoresis, appetite change and fatigue.  HENT: Negative.   Eyes: Negative.   Respiratory: Negative.  Negative for cough, choking, shortness of breath, wheezing and stridor.   Cardiovascular: Negative.  Negative for chest pain, palpitations and leg swelling.  Gastrointestinal: Positive for constipation. Negative for nausea, vomiting, abdominal pain, diarrhea and blood in stool.  Endocrine: Negative.   Genitourinary: Negative.   Musculoskeletal: Negative.  Negative for arthralgias, back pain, gait problem, joint swelling, myalgias, neck pain and neck stiffness.  Skin: Negative.  Negative for rash.  Allergic/Immunologic: Negative.   Neurological: Negative.  Negative for dizziness, tremors, focal weakness, syncope, speech difficulty, light-headedness, numbness and headaches.  Hematological: Negative.  Negative for adenopathy. Does not bruise/bleed easily.  Psychiatric/Behavioral: Positive for sleep disturbance. Negative for suicidal ideas, hallucinations, behavioral problems, confusion, self-injury, dysphoric mood, decreased concentration and agitation. The patient is nervous/anxious. The patient is not  hyperactive.        Objective:   Physical Exam  Vitals reviewed. Constitutional: She is oriented to person, place, and time. She appears well-developed and well-nourished. No distress.  HENT:  Head: Normocephalic and atraumatic.  Mouth/Throat: Oropharynx is clear and moist. No oropharyngeal exudate.  Eyes: Conjunctivae are normal. Right eye exhibits no discharge. Left eye exhibits no discharge. No scleral icterus.  Neck: Normal range of motion. Neck supple. No JVD present. No tracheal deviation present. No thyromegaly present.  Cardiovascular: Normal rate, regular rhythm, S1 normal, S2 normal and intact distal pulses.  Exam reveals no gallop.   Murmur heard.  Decrescendo systolic murmur is present with a grade of 1/6   No diastolic murmur is present  Pulmonary/Chest: Effort normal and breath sounds normal. No stridor. No respiratory distress. She has no wheezes. She has no rales. She exhibits no tenderness.  Abdominal: Soft. Bowel sounds are normal. She exhibits no distension and no mass. There is no tenderness. There is no rebound and no guarding.  Musculoskeletal: Normal range of motion. She exhibits no edema.  Lymphadenopathy:    She has no cervical adenopathy.  Neurological: She is oriented to person, place, and time.  Skin: Skin is warm and dry. No rash noted. She is not diaphoretic. No erythema. No pallor.  Psychiatric: She has a normal mood and affect. Her behavior is normal. Judgment and thought content normal.     Lab Results  Component Value Date   WBC 8.0 05/19/2014   HGB 12.6 05/19/2014   HCT 38.4 05/19/2014   PLT 153 05/19/2014   GLUCOSE 86 05/19/2014   CHOL 173 12/24/2012   TRIG 102.0 12/24/2012   HDL 67.60 12/24/2012   LDLDIRECT 150.1 04/15/2011   LDLCALC 85 12/24/2012   ALT 10 05/19/2014   AST 17 05/19/2014   NA 141 05/19/2014   K 3.8  05/19/2014   CL 104 05/19/2014   CREATININE 0.60 05/19/2014   BUN 11 05/19/2014   CO2 25 05/19/2014   TSH 1.79 12/24/2013   INR 0.97  10/28/2013   HGBA1C 5.5 02/21/2014       Assessment & Plan:

## 2014-05-25 NOTE — Assessment & Plan Note (Signed)
Her Vit D level is low I have asked her to restart a supplement for this

## 2014-05-25 NOTE — Progress Notes (Signed)
Pre visit review using our clinic review tool, if applicable. No additional management support is needed unless otherwise documented below in the visit note. 

## 2014-05-25 NOTE — Patient Instructions (Signed)

## 2014-05-26 ENCOUNTER — Telehealth: Payer: Self-pay | Admitting: Internal Medicine

## 2014-05-26 NOTE — Telephone Encounter (Signed)
Relevant patient education assigned to patient using Emmi. ° °

## 2014-05-28 ENCOUNTER — Encounter: Payer: Self-pay | Admitting: Internal Medicine

## 2014-05-28 NOTE — Assessment & Plan Note (Signed)
She achieved her LDL goal

## 2014-05-28 NOTE — Assessment & Plan Note (Signed)
She appears to be euthyroid Her recent TSH was in the normal range

## 2014-05-28 NOTE — Assessment & Plan Note (Signed)
She will cont xanax as needed I will check her UDS today to see that she is compliant and to screen for substance abuse

## 2014-05-28 NOTE — Assessment & Plan Note (Signed)
Her BP is well controlled 

## 2014-06-28 ENCOUNTER — Other Ambulatory Visit: Payer: Self-pay

## 2014-06-28 DIAGNOSIS — K219 Gastro-esophageal reflux disease without esophagitis: Secondary | ICD-10-CM

## 2014-06-28 DIAGNOSIS — K222 Esophageal obstruction: Principal | ICD-10-CM

## 2014-06-28 MED ORDER — PROMETHAZINE HCL 12.5 MG PO TABS: 12.5000 mg | ORAL_TABLET | Freq: Four times a day (QID) | ORAL | Status: DC | PRN

## 2014-07-19 ENCOUNTER — Encounter: Payer: Self-pay | Admitting: Pulmonary Disease

## 2014-08-08 ENCOUNTER — Ambulatory Visit
Admission: RE | Admit: 2014-08-08 | Discharge: 2014-08-08 | Disposition: A | Discharge status: Home / Self Care | Payer: Commercial Managed Care - HMO | Source: Ambulatory Visit | Attending: Orthopedic Surgery | Admitting: Orthopedic Surgery

## 2014-08-08 ENCOUNTER — Other Ambulatory Visit: Payer: Self-pay | Admitting: Orthopedic Surgery

## 2014-08-08 DIAGNOSIS — M1711 Unilateral primary osteoarthritis, right knee: Secondary | ICD-10-CM

## 2014-08-15 LAB — HM MAMMOGRAPHY: HM MAMMO: NORMAL

## 2014-08-24 ENCOUNTER — Telehealth: Payer: Self-pay | Admitting: Internal Medicine

## 2014-08-24 DIAGNOSIS — M1990 Unspecified osteoarthritis, unspecified site: Secondary | ICD-10-CM

## 2014-08-24 MED ORDER — HYDROCODONE-ACETAMINOPHEN 10-325 MG PO TABS: 1.0000 | ORAL_TABLET | Freq: Four times a day (QID) | ORAL | Status: DC | PRN

## 2014-08-24 NOTE — Telephone Encounter (Signed)
Patient is requesting refill on hydrocodone  °

## 2014-08-24 NOTE — Telephone Encounter (Signed)
Called the patient LEFT A DETAILED MESSAGE THAT HARDCOPY IS READY FOR PICKUP AT THE FRONT DESK.

## 2014-08-24 NOTE — Telephone Encounter (Signed)
Patient does not have enough to get her through until Monday.  She is requesting another provider to prescribe.

## 2014-08-24 NOTE — Telephone Encounter (Signed)
Done hardcopy to robin  

## 2014-09-12 ENCOUNTER — Telehealth: Payer: Self-pay | Admitting: *Deleted

## 2014-09-12 MED ORDER — ALPRAZOLAM 1 MG PO TABS: 1.0000 mg | ORAL_TABLET | Freq: Three times a day (TID) | ORAL | Status: DC | PRN

## 2014-09-12 NOTE — Telephone Encounter (Signed)
done

## 2014-09-12 NOTE — Telephone Encounter (Signed)
Rf req for Alprazolam 1 mg 1 po tid prn. Last filled 08/13/14. Ok to Rf?

## 2014-09-13 ENCOUNTER — Other Ambulatory Visit: Payer: Self-pay | Admitting: Orthopedic Surgery

## 2014-09-13 DIAGNOSIS — M5136 Other intervertebral disc degeneration, lumbar region: Secondary | ICD-10-CM

## 2014-09-13 DIAGNOSIS — M47816 Spondylosis without myelopathy or radiculopathy, lumbar region: Secondary | ICD-10-CM

## 2014-09-13 NOTE — Telephone Encounter (Signed)
Rx faxed to pharmacy by Ellison Hughs.

## 2014-09-15 ENCOUNTER — Encounter: Payer: Self-pay | Admitting: Internal Medicine

## 2014-09-17 ENCOUNTER — Ambulatory Visit
Admission: RE | Admit: 2014-09-17 | Discharge: 2014-09-17 | Disposition: A | Discharge status: Home / Self Care | Payer: Commercial Managed Care - HMO | Source: Ambulatory Visit | Attending: Orthopedic Surgery | Admitting: Orthopedic Surgery

## 2014-09-17 DIAGNOSIS — M47816 Spondylosis without myelopathy or radiculopathy, lumbar region: Secondary | ICD-10-CM

## 2014-09-17 DIAGNOSIS — M5136 Other intervertebral disc degeneration, lumbar region: Secondary | ICD-10-CM

## 2014-09-21 ENCOUNTER — Telehealth: Payer: Self-pay | Admitting: Internal Medicine

## 2014-09-21 NOTE — Telephone Encounter (Signed)
Dr. Marily Memos an Ortho wants the patient to have back surgery but the insurance [facility]) will not pay unless she gets a referral from her PCP.  Please advise

## 2014-09-21 NOTE — Telephone Encounter (Signed)
What is the diagnosis code that will be used for the referral?

## 2014-09-23 NOTE — Telephone Encounter (Signed)
Spoke with Dr. Eulas Post office and was given dx code 630.20. Thanks

## 2014-09-26 ENCOUNTER — Other Ambulatory Visit: Payer: Self-pay | Admitting: Internal Medicine

## 2014-09-26 DIAGNOSIS — R0609 Other forms of dyspnea: Principal | ICD-10-CM

## 2014-09-26 NOTE — Telephone Encounter (Signed)
FYI, pt called and states she is not having back surgery, surgeon felt it would be too risky to have surgery. Surgeon felt due to her age and health problems it would not be a good idea. Pt is now requesting a referral to a cardiologist. Please advise.

## 2014-09-26 NOTE — Telephone Encounter (Signed)
That is an ICD-9 number I need an ICD-10 code

## 2014-09-26 NOTE — Telephone Encounter (Signed)
done

## 2014-09-27 ENCOUNTER — Telehealth: Payer: Self-pay

## 2014-09-27 DIAGNOSIS — F411 Generalized anxiety disorder: Secondary | ICD-10-CM

## 2014-09-27 DIAGNOSIS — F0280 Dementia in other diseases classified elsewhere without behavioral disturbance: Secondary | ICD-10-CM

## 2014-09-27 DIAGNOSIS — F0393 Unspecified dementia, unspecified severity, with mood disturbance: Secondary | ICD-10-CM

## 2014-09-27 DIAGNOSIS — F329 Major depressive disorder, single episode, unspecified: Principal | ICD-10-CM

## 2014-09-27 MED ORDER — TRAZODONE HCL 100 MG PO TABS: 200.0000 mg | ORAL_TABLET | Freq: Every day | ORAL | Status: DC

## 2014-09-27 NOTE — Telephone Encounter (Signed)
Received fax from pharmacy requesting refill on trazodone last filled 10/26/13 #180/3 rf.

## 2014-09-27 NOTE — Addendum Note (Signed)
Addended by: [name] on: 09/27/2014 05:20 PM   Modules accepted: Orders

## 2014-10-21 ENCOUNTER — Encounter: Payer: Self-pay | Admitting: Cardiovascular Disease

## 2014-10-21 NOTE — Telephone Encounter (Signed)
This encounter was created in error - please disregard.

## 2014-10-21 NOTE — Telephone Encounter (Signed)
New Msg   Patient is returning a call, she's not sure what call was in regards to. Patient would like call back at [phone].

## 2014-10-26 ENCOUNTER — Ambulatory Visit (INDEPENDENT_AMBULATORY_CARE_PROVIDER_SITE_OTHER): Payer: Commercial Managed Care - HMO | Admitting: Cardiovascular Disease

## 2014-10-26 ENCOUNTER — Encounter: Payer: Self-pay | Admitting: Cardiovascular Disease

## 2014-10-26 VITALS — BP 130/60 | HR 61 | Ht 62.0 in | Wt 200.0 lb

## 2014-10-26 DIAGNOSIS — Z01818 Encounter for other preprocedural examination: Secondary | ICD-10-CM

## 2014-10-26 DIAGNOSIS — R0600 Dyspnea, unspecified: Secondary | ICD-10-CM

## 2014-10-26 DIAGNOSIS — R0609 Other forms of dyspnea: Secondary | ICD-10-CM

## 2014-10-26 DIAGNOSIS — I10 Essential (primary) hypertension: Secondary | ICD-10-CM

## 2014-10-26 DIAGNOSIS — I350 Nonrheumatic aortic (valve) stenosis: Secondary | ICD-10-CM

## 2014-10-26 DIAGNOSIS — Z01810 Encounter for preprocedural cardiovascular examination: Secondary | ICD-10-CM

## 2014-10-26 NOTE — Assessment & Plan Note (Signed)
Seems functional Normal lung exam normal EF by echo and normal ECG  Will r/o ischemia see preop evalutation

## 2014-10-26 NOTE — Patient Instructions (Signed)
Your physician recommends that you schedule a follow-up appointment in:  AS NEEDED  Your physician recommends that you continue on your current medications as directed. Please refer to the Current Medication list given to you today.  Your physician has requested that you have a lexiscan myoview. For further information please visit [email or web address]. Please follow instruction sheet, as given.  

## 2014-10-26 NOTE — Assessment & Plan Note (Signed)
Well controlled.  Continue current medications and low sodium Dash type diet.    

## 2014-10-26 NOTE — Assessment & Plan Note (Signed)
Benign murmur sclerosis not stenosis no need for f/u echo at this time

## 2014-10-26 NOTE — Assessment & Plan Note (Signed)
Elderly female with extensive calcification of infrarenal aorta needing R TKR and back surgery  Cannot walk on treadmill F/U lexiscan myovue  No coronary calcium on CT though

## 2014-10-26 NOTE — Progress Notes (Signed)
Patient ID: [name], female   DOB: 08/05/1933, 80 y.o.   MRN: [id number]    81 yo referred by Dr Jones for dyspnea and preop clearance.  She has chronic back pain and was referred to Dr Fleig.  She indicates he wouldn't operate on her Due to hardening of the arteries and calcified blood vessels.  Not clear to me what this was based on.  I reviewed a CT scan she had 6/15 and she does have some Calcification of her infra renal aorta but no aneurysm and no coronary artery calcium  She has functional chronic exertional dyspnea  Activity also limited by knee pain Dr Carter has replaced left knee twice and done arthroscopic surgery on right but she will need a replacement soon.  Needs more pain management .  No history of CAD, vascular disease, claudication or TIA.  Non smoker non diabetic compliant with meds  No chest pain   Echo 12/14 benign  Aortic valve sclerosis not stenosis  Mean gradient 10 mmgh - Left ventricle: The cavity size was normal. Wall thickness was increased in a pattern of mild LVH. Systolic function was normal. The estimated ejection fraction was in the range of 60% to 65%. Wall motion was normal; there were no regional wall motion abnormalities. Doppler parameters are consistent with abnormal left ventricular relaxation (grade 1 diastolic dysfunction). Doppler parameters are consistent with elevated ventricular end-diastolic filling pressure. - Atrial septum: No defect or patent foramen ovale was identified.    ROS: Denies fever, malais, weight loss, blurry vision, decreased visual acuity, cough, sputum, SOB, hemoptysis, pleuritic pain, palpitaitons, heartburn, abdominal pain, melena, lower extremity edema, claudication, or rash.  All other systems reviewed and negative   General: Affect appropriate Overweight black female  HEENT: normal Neck supple with no adenopathy JVP normal no bruits no thyromegaly Lungs clear with no wheezing and good  diaphragmatic motion Heart:  S1/S2 SEM murmur,rub, gallop or click PMI normal Abdomen: benighn, BS positve, no tenderness, no AAA no bruit.  No HSM or HJR Distal pulses intact with no bruits No edema Neuro non-focal Skin warm and dry No muscular weakness  Medications Current Outpatient Prescriptions  Medication Sig Dispense Refill  . aspirin 81 MG chewable tablet Chew 1 tablet (81 mg total) by mouth daily. 30 tablet 1  . calcium carbonate (OS-CAL) 600 MG TABS tablet Take 600 mg by mouth 2 (two) times daily with a meal.    . Cholecalciferol (VITAMIN D3) 1000 UNITS CAPS Take 2 capsules (2,000 Units total) by mouth daily. 60 capsule 11  . gabapentin (NEURONTIN) 100 MG capsule Take 1 capsule (100 mg total) by mouth 3 (three) times daily. 90 capsule 5  . HYDROcodone-acetaminophen (NORCO) 10-325 MG per tablet Take 1 tablet by mouth every 6 (six) hours as needed for moderate pain. 75 tablet 0  . isosorbide-hydrALAZINE (BIDIL) 20-37.5 MG per tablet Take 0.5 tablets by mouth 2 (two) times daily. 30 tablet 11  . losartan (COZAAR) 100 MG tablet Take 1 tablet (100 mg total) by mouth daily. 90 tablet 3  . Multiple Minerals-Vitamins (CALCIUM & VIT D3 BONE HEALTH PO) Take 1 capsule by mouth daily.    . potassium chloride SA (K-DUR,KLOR-CON) 20 MEQ tablet Take 1 tablet (20 mEq total) by mouth 2 (two) times daily. 180 tablet 1  . pravastatin (PRAVACHOL) 80 MG tablet Take 80 mg by mouth daily.    . traZODone (DESYREL) 100 MG tablet Take 2 tablets (200 mg total) by mouth at bedtime.   180 tablet 3  . ALPRAZolam (XANAX) 1 MG tablet Take 1 tablet (1 mg total) by mouth 3 (three) times daily as needed for anxiety. (Patient not taking: Reported on 10/26/2014) 90 tablet 5  . meclizine (ANTIVERT) 25 MG tablet Take 25 mg by mouth 3 (three) times daily as needed for dizziness.    . promethazine (PHENERGAN) 12.5 MG tablet Take 1 tablet (12.5 mg total) by mouth every 6 (six) hours as needed for nausea. (Patient not  taking: Reported on 10/26/2014) 30 tablet 2   No current facility-administered medications for this visit.    Allergies Amlodipine; Enalapril; and Metformin  Family History: Family History  Problem Relation Age of Onset  . Alcohol abuse Other   . Hypertension Other   . Kidney disease Other   . Mental illness Other   . Cancer Father     prostate cancer  . Heart disease Sister   . Heart disease Brother   . Emphysema Brother     Social History: History   Social History  . Marital Status: Widowed    Spouse Name: N/A    Number of Children: 5  . Years of Education: N/A   Occupational History  . retired      weaver at [employer]   Social History Main Topics  . Smoking status: Never Smoker   . Smokeless tobacco: Never Used  . Alcohol Use: No  . Drug Use: No  . Sexual Activity: Not Currently   Other Topics Concern  . Not on file   Social History Narrative    Past Surgical History  Procedure Laterality Date  . Appendectomy    . Cholecystectomy    . Abdominal hysterectomy    . Tonsillectomy    . Revision total knee arthroplasty  2011  . Rotator cuff repair  2010  . Tonsillectomy    . Joint replacement      LEFT KNEE   . Knee arthroscopy      RIGHT  . Shoulder arthroscopy  09/17/2012    Procedure: ARTHROSCOPY SHOULDER;  Surgeon: Arthur F Carter, MD;  Location: [facility] OR;  Service: Orthopedics;  Laterality: Left;    Past Medical History  Diagnosis Date  . Hyperlipidemia   . Hypertension   . Osteoarthritis   . Wears dentures   . Bruises easily   . Diarrhea   . Abdominal pain   . Wears glasses   . Shortness of breath     WITH EXERTION USES 2 L O2 BEDTIME  . Oxygen desaturation during sleep     USES 2 LITERS BEDTIME   VIA CPAP 06/2012 [facility]   . Anxiety   . Depression   . Sleep apnea     CPAP WITH O2 2 LITERS 2013 ([facility])  . Diabetes mellitus     type II  DIET CONTROLLED  . Chronic headaches     HISTORY OF   . Anemia     NOS  . Hepatitis      AGE 30S  . Chronic diastolic CHF (congestive heart failure)     a. 06/2013 EF 65-70%.  . Mild aortic stenosis     a. 06/2013 Echo: EF 65-70%, mod LVH with focal basal hypertrophy, very mild AS.    Electrocardiogram:  SR rate 61 normal ECG read as septal infarct   Assessment and Plan   

## 2014-10-28 ENCOUNTER — Other Ambulatory Visit: Payer: Self-pay | Admitting: Internal Medicine

## 2014-11-02 ENCOUNTER — Ambulatory Visit (HOSPITAL_COMMUNITY): Payer: Commercial Managed Care - HMO | Attending: Cardiovascular Disease | Admitting: Radiology

## 2014-11-02 DIAGNOSIS — R0600 Dyspnea, unspecified: Secondary | ICD-10-CM | POA: Insufficient documentation

## 2014-11-02 DIAGNOSIS — Z01818 Encounter for other preprocedural examination: Secondary | ICD-10-CM

## 2014-11-02 DIAGNOSIS — Z01810 Encounter for preprocedural cardiovascular examination: Secondary | ICD-10-CM | POA: Insufficient documentation

## 2014-11-02 DIAGNOSIS — I10 Essential (primary) hypertension: Secondary | ICD-10-CM | POA: Diagnosis not present

## 2014-11-02 DIAGNOSIS — E119 Type 2 diabetes mellitus without complications: Secondary | ICD-10-CM | POA: Insufficient documentation

## 2014-11-02 MED ORDER — TECHNETIUM TC 99M SESTAMIBI GENERIC - CARDIOLITE
33.0000 | Freq: Once | INTRAVENOUS | Status: AC | PRN
  Administered 2014-11-02: 33 via INTRAVENOUS

## 2014-11-02 MED ORDER — REGADENOSON 0.4 MG/5ML IV SOLN
0.4000 mg | Freq: Once | INTRAVENOUS | Status: AC
  Administered 2014-11-02: 0.4 mg via INTRAVENOUS

## 2014-11-02 MED ORDER — TECHNETIUM TC 99M SESTAMIBI GENERIC - CARDIOLITE
11.0000 | Freq: Once | INTRAVENOUS | Status: AC | PRN
  Administered 2014-11-02: 11 via INTRAVENOUS

## 2014-11-02 NOTE — Progress Notes (Signed)
[facility] [place], [place] [address] [phone]    Cardiology Nuclear Med Study  [name] is a 80 y.o. female     MRN : [id number]     DOB: 29-Mar-1933  Procedure Date: 11/02/2014  Nuclear Med Background Indication for Stress Test:  Evaluation for Ischemia and Surgical Clearance Knee Surgery History:  No known CAD Cardiac Risk Factors: Hypertension and NIDDM  Symptoms:  SOB   Nuclear Pre-Procedure Caffeine/Decaff Intake:  None NPO After: 8:00pm   Lungs:  clear O2 Sat: 94% on room air. IV 0.9% NS with Angio Cath:  22g  IV Site: L Antecubital  IV Started by:  Crissie Figures, RN  Chest Size (in):  42 Cup Size: DDD  Height: 5\' 2"  (1.575 m)  Weight:  200 lb (90.719 kg)  BMI:  Body mass index is 36.57 kg/(m^2). Tech Comments:  N/A    Nuclear Med Study 1 or 2 day study: 1 day  Stress Test Type:  Lexiscan  Reading MD: N/A  Order Authorizing Provider:  Jenkins Rouge, MD  Resting Radionuclide: Technetium 42m Sestamibi  Resting Radionuclide Dose: 11.0 mCi   Stress Radionuclide:  Technetium 48m Sestamibi  Stress Radionuclide Dose: 33.0 mCi           Stress Protocol Rest HR: 51 Stress HR: 64  Rest BP: 196/82 Stress BP: 164/61  Exercise Time (min): n/a METS: n/a   Predicted Max HR: 141 bpm % Max HR: 45.39 bpm Rate Pressure Product: 99371   Dose of Adenosine (mg):  n/a Dose of Lexiscan: 0.4 mg  Dose of Atropine (mg): n/a Dose of Dobutamine: n/a mcg/kg/min (at max HR)  Stress Test Technologist: Glade Lloyd, BS-ES  Nuclear Technologist:  Earl Many, CNMT     Rest Procedure:  Myocardial perfusion imaging was performed at rest 45 minutes following the intravenous administration of Technetium 32m Sestamibi. Rest ECG: Sinus bradycardia with non-specific ST-T wave changes  Stress Procedure:  The patient received IV Lexiscan 0.4 mg over 15-seconds.  Technetium 56m Sestamibi injected at 30-seconds.  Quantitative spect images were  obtained after a 45 minute delay.  During the infusion of Lexiscan the patient complained of SOB and nausea.  These symptoms began to resolve in recovery.  Stress ECG: No significant change from baseline ECG  QPS Raw Data Images:  There is interference from nuclear activity from structures below the diaphragm. This does not affect the ability to read the study. Stress Images:  There is decreased uptake in the basal, mid and apical inferolateral walls. Rest Images:  Normal homogeneous uptake in all areas of the myocardium. Subtraction (SDS):  There is medium size, moderate severity reversible defect in the basal, mid and apical inefrolateral walls (SDS 5).  Transient Ischemic Dilatation (Normal <1.22):  0.79 Lung/Heart Ratio (Normal <0.45):  0.25  Quantitative Gated Spect Images QGS EDV:  84 ml QGS ESV:  25 ml  Impression Exercise Capacity:  Lexiscan with no exercise. BP Response:  Normal blood pressure response. Clinical Symptoms:  There is dyspnea. ECG Impression:  Baseline ST-T changes, slightly worse STD at stress. Comparison with Prior Nuclear Study: No previous nuclear study performed  Overall Impression:  Intermediate risk stress nuclear study with medium size, moderate severity reversible defect in the inferolateral wall. SDS 5. .  LV Ejection Fraction: 71%.  LV Wall Motion:  NL LV Function; NL Wall Motion     Dorothy Spark 11/02/2014

## 2014-11-03 ENCOUNTER — Encounter (HOSPITAL_COMMUNITY): Payer: Self-pay | Admitting: Cardiovascular Disease

## 2014-11-03 ENCOUNTER — Telehealth: Payer: Self-pay | Admitting: Internal Medicine

## 2014-11-03 DIAGNOSIS — M5440 Lumbago with sciatica, unspecified side: Secondary | ICD-10-CM

## 2014-11-03 NOTE — Telephone Encounter (Signed)
Dr [place] office called, orthopedic, pt has requested pain clinic for lumbar pain. Apparently this needs to be done by primary care MD and this MD's office told pt they would call over to PCP to refer to pain clinic. Informed office MD out of office until 12/14. Pt wants to be referred to Dr. Vira Blanco [phone]).

## 2014-11-04 ENCOUNTER — Encounter: Payer: Self-pay | Admitting: Internal Medicine

## 2014-11-04 ENCOUNTER — Ambulatory Visit (INDEPENDENT_AMBULATORY_CARE_PROVIDER_SITE_OTHER): Payer: Commercial Managed Care - HMO | Admitting: Internal Medicine

## 2014-11-04 VITALS — BP 118/72 | HR 64 | Temp 97.7°F | Resp 14 | Wt 197.0 lb

## 2014-11-04 DIAGNOSIS — E063 Autoimmune thyroiditis: Secondary | ICD-10-CM

## 2014-11-04 DIAGNOSIS — E278 Other specified disorders of adrenal gland: Secondary | ICD-10-CM

## 2014-11-04 LAB — T3, FREE: T3, Free: 2.5 pg/mL (ref 2.3–4.2)

## 2014-11-04 LAB — POTASSIUM: Potassium: 4.5 mEq/L (ref 3.5–5.1)

## 2014-11-04 LAB — T4, FREE: Free T4: 0.47 ng/dL — ABNORMAL LOW (ref 0.60–1.60)

## 2014-11-04 LAB — TSH: TSH: 4.03 u[IU]/mL (ref 0.35–4.50)

## 2014-11-04 MED ORDER — DEXAMETHASONE 1 MG PO TABS: ORAL_TABLET | ORAL | Status: DC

## 2014-11-04 NOTE — Patient Instructions (Signed)
Please stop at the lab. Please take the Dexamethasone 1 mg tablet at 11 pm before coming for labs at 8 am the next morning. Please return in 1 year.

## 2014-11-04 NOTE — Progress Notes (Signed)
Patient ID: [name], female   DOB: 1932-11-29, 80 y.o.   MRN: [id number]   HPI  Karsen PEITYN PAYTON is a 80 y.o.-year-old female, returning for f/u for an adrenal incidentaloma and Hashimoto's hypothyroidism. Last visit 6 mo ago.  She will have to have R TKR surgery. She was not considered a good candidate for back surgery. She will be referred to a pain clinic. She is now on hydrocodone >> helps a little.   Hashimoto's thyroiditis: Reviewed recent TFTs: Lab Results  Component Value Date   TSH 2.66 05/25/2014   TSH 1.79 12/24/2013   TSH 1.06 11/04/2013   TSH 5.235* 10/27/2013   TSH 7.83* 10/26/2013   FREET4 0.61 12/24/2013   FREET4 0.47* 11/04/2013   FREET4 0.65* 10/28/2013   Component     Latest Ref Rng 11/04/2013  Thyroid Peroxidase Antibody     <35.0 IU/mL 1001.0 (H)   On her angio chest CT, she was found to have enlarged thyroid gland with nonspecific rounded calcification along the isthmus.   No significant neck compression sxs: but she has dysphagia, for which she learned to take smaller bites.   Adrenal incidentaloma:  Pt had 4 CT scans - adrenal mass not changed - will not repeat. - 04/16/2010: CT abd with and w/o ct  - 06/05/2011: CT abd and pelvis with ct - 04/05/2013: CT abd and pelvis w/o ct - 05/20/2014: Ct abd and pelvis with ct - 2.8 x 1.4 cm stable adrenal nodule  She has had HTN x at least 10 years. Today, BP normal. Highes sBP in the 200 x1, not recently  No h/o AMI, strokes. She had low K in the past and is on supplements.   She has OSA >> wears it every night.   She does not have DM.  Lab Results  Component Value Date   HGBA1C 5.5 02/21/2014   Hormonal w/u was negative for Cushing's sd., primary aldosteronism or pheochromocytoma in 10/2013:  Component     Latest Ref Rng 11/04/2013 11/05/2013  Epinephrine      undetectable   Norepinephrine      370   Dopamine      undetectable   Catecholamines, Total      370   Metanephrine, Free     <=57  pg/mL <25   Normetanephrine, Free     <=148 pg/mL 75   Total Metanephrines-Plasma     <=205 pg/mL 75   PRA LC/MS/MS     0.25 - 5.82 ng/mL/h 3.04   ALDO / PRA Ratio     0.9 - 28.9 Ratio 2.3   ALDOSTERONE      7   Cortisol, Plasma       1.6  Dexamethasone, Serum       347   ROS: Constitutional: no weight loss/gain, decreased appetite, + fatigue,  + hot flushes, + poor sleep Eyes: no blurry vision, no xerophthalmia ENT: no sore throat, no nodules palpated in throat, + dysphagia/no odynophagia, no hoarseness, + decreased hearing Cardiovascular: no CP/+ SOB/no palpitations/leg swelling Respiratory: no cough/+SOB Gastrointestinal: + N/no V/+ D/+ C Musculoskeletal: no muscle/+ joint aches Skin: no rashes, + easy bruising, + itching Neurological: + tremors/no numbness/tingling/dizziness  I reviewed pt's medications, allergies, PMH, social hx, family hx, and changes were documented in the history of present illness. Otherwise, unchanged from my initial visit note.  PE: BP 118/72 mmHg  Pulse 64  Temp(Src) 97.7 F (36.5 C) (Oral)  Resp 14  Wt 197 lb (89.359  kg)  SpO2 94% Wt Readings from Last 3 Encounters:  11/04/14 197 lb (89.359 kg)  11/02/14 200 lb (90.719 kg)  10/26/14 200 lb (90.719 kg)   Constitutional: overweight, in NAD, walks with cane Eyes: PERRLA, EOMI, no exophthalmos, no lid lag, no stare ENT: moist mucous membranes, no thyromegaly - no nodules felt, no cervical lymphadenopathy Cardiovascular: RRR, No MRG Respiratory: CTA B Gastrointestinal: abdomen soft, NT, ND, BS+ Musculoskeletal: no deformities, strength intact in all 4 Skin: moist, warm, no rashes Neurological: no tremor with outstretched hands, DTR normal in all 4  ASSESSMENT: 1. Adrenal incidentaloma - Pt had 4 CT scans - reviewed images and reports - adrenal mass (39 HU) not changed - will not repeat. - 04/16/2010: CT abd with and w/o ct  - 06/05/2011: CT abd and pelvis with ct - 04/05/2013: CT abd  and pelvis w/o ct - 05/20/2014: Ct abd and pelvis with ct - Left adrenal gland is unremarkable. 2.8 x 1.4 cm intermediate density nodule within the right adrenal gland is stable from prior -  Hormonal w/u (10/2013): negative for Cushing's sd., primary aldosteronism, or pheochromocytoma in 10/2013.  2. Hashimoto's thyroiditis   3. MNG - Thyroid U/S (11/15/2014):  - Right thyroid lobe: 6.5 x 3.6 x 3.5 cm. There is 0.8 x 0.7 x 0.9 cm hypoechoic nodule in the mid right lobe. Remainder the right lobe is inhomogeneous and enlarged. - Left thyroid lobe: 6.6 x 3.8 x 3.6 cm. There is a hyperechoic solid 1.2 x 1.0 x 1.2 cm nodule in the left upper pole. There is a 4 mm cyst in the mid left lobe. - Isthmus Thickness: Calcified 0.7 cm nodule in the right side of the isthmus which correlates with the calcification on CT scan. No nodules visualized. - Lymphadenopathy None visualized. IMPRESSION: Multinodular goiter. None of the visualized nodules fit criteria for fine needle aspiration biopsy at this time.  4. Dysphagia   PLAN:  1. Patient with a 2.1 cm R adrenal nodule discovered incidentally during hospitalization for SOB + fluid retention. The nodule is 39 HU on CT. - To differentiate between a functioning and a nonfunctioning adrenal nodule, we ruled out hypersecretion by checking the following tests (all negative 1 year ago) - dexamethasone suppression test to rule out Cushing syndrome (6% of adrenal incidentalomas)  - Plasma fractionated metanephrines and catecholamines to rule out pheochromocytoma (3% of adrenal incidentalomas) - Plasma renin activity and aldosterone level to rule out primary hyperaldosteronism (0.6% of adrenal incidentalomas) - she also had 4 abd CT scans 2011-2015 >> adrenal  mass stable - will plan for:   hormonal testing yearly for 5 years  No more imaging tests - I will see her back in 1 year  2. Hypothyroidism - has high TPO Ab titer - last 3 TFT sets normal - will  recheck in 1 year  3. MNG - reviewed the thyroid U/S report >> nodules small and unlikely to cause compression - we will likely need a repeat U/S if dysphagia worsens  4. Dysphagia - improved  - at last visit, we decided to check a Barium swallow and check for internal vs external compression, but she did not have it done yet. Would like to postpone this for now as her dysphagia is better.  Component     Latest Ref Rng 11/04/2013          PRA LC/MS/MS     0.25 - 5.82 ng/mL/h 3.04  ALDO / PRA Ratio  0.9 - 28.9 Ratio 2.3  ALDOSTERONE      7  Potassium     3.5 - 5.3 mEq/L    Component     Latest Ref Rng 11/10/2014          PRA LC/MS/MS     0.25 - 5.82 ng/mL/h 0.22 (L)  ALDO / PRA Ratio     0.9 - 28.9 Ratio 59.1 (H)  ALDOSTERONE      13  Potassium     3.5 - 5.3 mEq/L 4.5   There is a drastic change in her PRA and Aldosterone levels since last visit. I have a low suspicion that this is due to new primary hyperaldosteronism since this usually takes longer to develop. Rather, I believe that the PRA is suppressed 2/2 increased salt consumption in the day prior to the lab. I will ask her to come and have the labs above repeated in ~1 month.

## 2014-11-05 DIAGNOSIS — M545 Low back pain, unspecified: Secondary | ICD-10-CM | POA: Insufficient documentation

## 2014-11-07 ENCOUNTER — Other Ambulatory Visit: Payer: Commercial Managed Care - HMO

## 2014-11-07 ENCOUNTER — Other Ambulatory Visit (INDEPENDENT_AMBULATORY_CARE_PROVIDER_SITE_OTHER): Payer: Commercial Managed Care - HMO

## 2014-11-07 DIAGNOSIS — E278 Other specified disorders of adrenal gland: Secondary | ICD-10-CM

## 2014-11-07 LAB — CORTISOL: Cortisol, Plasma: 2.6 ug/dL

## 2014-11-08 ENCOUNTER — Telehealth: Payer: Self-pay | Admitting: *Deleted

## 2014-11-08 ENCOUNTER — Encounter: Payer: Self-pay | Admitting: *Deleted

## 2014-11-08 ENCOUNTER — Other Ambulatory Visit: Payer: Self-pay | Admitting: *Deleted

## 2014-11-08 DIAGNOSIS — Z01818 Encounter for other preprocedural examination: Secondary | ICD-10-CM

## 2014-11-08 LAB — ALDOSTERONE + RENIN ACTIVITY W/ RATIO

## 2014-11-08 LAB — CATECHOLAMINES, FRACTIONATED, PLASMA
Catecholamines, Total: 218 pg/mL
NOREPINEPHRINE: 218 pg/mL

## 2014-11-08 NOTE — Telephone Encounter (Signed)
Another [facility] error! Please call her to come back for PRA, aldosterone, and potassium. The PRA and aldosterone have 0 value if not accompanied by a potassium level. Please make sure the patient is reimbursed for the previous potassium that was drawn before, since this is not needed without a concomitant PRA and aldosterone.

## 2014-11-08 NOTE — Telephone Encounter (Signed)
[facility] labs called stating that only one lavender tube got drawn on Friday (frozen specimen) and it was only enough to for the catecholomines test. Pt will need to come back to have the aldosterone labs redrawn. Please advise.

## 2014-11-08 NOTE — Telephone Encounter (Signed)
PT  AWARE  CATH SCHEDULED FOR   11-14-14 AT   10:00 AM .Adonis Housekeeper

## 2014-11-08 NOTE — Telephone Encounter (Signed)
-----   Message from Josue Hector, MD sent at 11/03/2014  7:17 PM EST ----- Needs cath to clear for surgery  myovue abnormal set it up for me see when patient wants to do it

## 2014-11-09 ENCOUNTER — Other Ambulatory Visit (INDEPENDENT_AMBULATORY_CARE_PROVIDER_SITE_OTHER): Payer: Commercial Managed Care - HMO | Admitting: *Deleted

## 2014-11-09 ENCOUNTER — Other Ambulatory Visit: Payer: Self-pay | Admitting: Cardiovascular Disease

## 2014-11-09 DIAGNOSIS — Z01818 Encounter for other preprocedural examination: Secondary | ICD-10-CM

## 2014-11-09 LAB — CBC WITH DIFFERENTIAL/PLATELET
BASOS PCT: 0.3 % (ref 0.0–3.0)
Basophils Absolute: 0 10*3/uL (ref 0.0–0.1)
EOS ABS: 0.2 10*3/uL (ref 0.0–0.7)
Eosinophils Relative: 3.1 % (ref 0.0–5.0)
HEMATOCRIT: 39.1 % (ref 36.0–46.0)
HEMOGLOBIN: 12.6 g/dL (ref 12.0–15.0)
LYMPHS ABS: 2.6 10*3/uL (ref 0.7–4.0)
Lymphocytes Relative: 38.7 % (ref 12.0–46.0)
MCHC: 32.3 g/dL (ref 30.0–36.0)
MCV: 92.3 fl (ref 78.0–100.0)
Monocytes Absolute: 0.4 10*3/uL (ref 0.1–1.0)
Monocytes Relative: 5.8 % (ref 3.0–12.0)
NEUTROS ABS: 3.6 10*3/uL (ref 1.4–7.7)
Neutrophils Relative %: 52.1 % (ref 43.0–77.0)
Platelets: 164 10*3/uL (ref 150.0–400.0)
RBC: 4.23 Mil/uL (ref 3.87–5.11)
RDW: 13.3 % (ref 11.5–15.5)
WBC: 6.8 10*3/uL (ref 4.0–10.5)

## 2014-11-09 LAB — BASIC METABOLIC PANEL
BUN: 20 mg/dL (ref 6–23)
CALCIUM: 9.6 mg/dL (ref 8.4–10.5)
CO2: 26 meq/L (ref 19–32)
CREATININE: 0.8 mg/dL (ref 0.4–1.2)
Chloride: 105 mEq/L (ref 96–112)
GFR: 86.32 mL/min (ref >60.00)
Glucose, Bld: 120 mg/dL — ABNORMAL HIGH (ref 70–99)
Potassium: 3.6 mEq/L (ref 3.5–5.1)
Sodium: 140 mEq/L (ref 135–145)

## 2014-11-09 LAB — PROTIME-INR
INR: 1 ratio (ref 0.8–1.0)
PROTHROMBIN TIME: 11.3 s (ref 9.6–13.1)

## 2014-11-09 LAB — METANEPHRINES, PLASMA
Normetanephrine, Free: 68 pg/mL (ref <=148)
Total Metanephrines-Plasma: 68 pg/mL (ref <=205)

## 2014-11-09 NOTE — Telephone Encounter (Signed)
Called pt and she will come back on Friday to have labs done. Advised pt to come to the office here and pick up lab orders to be done at [facility] down stairs. Please put in labs to be drawn and I will print for pt. Thank you.

## 2014-11-10 ENCOUNTER — Other Ambulatory Visit: Payer: Self-pay | Admitting: *Deleted

## 2014-11-10 DIAGNOSIS — G4733 Obstructive sleep apnea (adult) (pediatric): Secondary | ICD-10-CM

## 2014-11-10 DIAGNOSIS — E278 Other specified disorders of adrenal gland: Secondary | ICD-10-CM

## 2014-11-10 NOTE — Telephone Encounter (Signed)
Orders in and printed for pt to pick up.

## 2014-11-10 NOTE — Telephone Encounter (Signed)
Can you please put in: potassium, PRA + aldosterone? Thank you.

## 2014-11-11 ENCOUNTER — Other Ambulatory Visit: Payer: Commercial Managed Care - HMO

## 2014-11-11 LAB — POTASSIUM: POTASSIUM: 4.5 meq/L (ref 3.5–5.3)

## 2014-11-14 ENCOUNTER — Ambulatory Visit (HOSPITAL_COMMUNITY)
Admission: RE | Admit: 2014-11-14 | Discharge: 2014-11-14 | Disposition: A | Discharge status: Home / Self Care | Payer: Commercial Managed Care - HMO | Source: Ambulatory Visit | Attending: Cardiovascular Disease | Admitting: Cardiovascular Disease

## 2014-11-14 ENCOUNTER — Encounter (HOSPITAL_COMMUNITY)
Admission: RE | Disposition: A | Discharge status: Home / Self Care | Payer: Self-pay | Source: Ambulatory Visit | Attending: Cardiovascular Disease

## 2014-11-14 ENCOUNTER — Encounter (HOSPITAL_COMMUNITY): Payer: Self-pay | Admitting: Cardiovascular Disease

## 2014-11-14 DIAGNOSIS — G4730 Sleep apnea, unspecified: Secondary | ICD-10-CM | POA: Insufficient documentation

## 2014-11-14 DIAGNOSIS — Z7982 Long term (current) use of aspirin: Secondary | ICD-10-CM | POA: Diagnosis not present

## 2014-11-14 DIAGNOSIS — I10 Essential (primary) hypertension: Secondary | ICD-10-CM | POA: Insufficient documentation

## 2014-11-14 DIAGNOSIS — G8929 Other chronic pain: Secondary | ICD-10-CM | POA: Diagnosis not present

## 2014-11-14 DIAGNOSIS — I2510 Atherosclerotic heart disease of native coronary artery without angina pectoris: Secondary | ICD-10-CM

## 2014-11-14 DIAGNOSIS — M1990 Unspecified osteoarthritis, unspecified site: Secondary | ICD-10-CM | POA: Diagnosis not present

## 2014-11-14 DIAGNOSIS — F329 Major depressive disorder, single episode, unspecified: Secondary | ICD-10-CM | POA: Insufficient documentation

## 2014-11-14 DIAGNOSIS — I509 Heart failure, unspecified: Secondary | ICD-10-CM | POA: Diagnosis not present

## 2014-11-14 DIAGNOSIS — R9439 Abnormal result of other cardiovascular function study: Secondary | ICD-10-CM | POA: Insufficient documentation

## 2014-11-14 DIAGNOSIS — E119 Type 2 diabetes mellitus without complications: Secondary | ICD-10-CM | POA: Insufficient documentation

## 2014-11-14 DIAGNOSIS — E785 Hyperlipidemia, unspecified: Secondary | ICD-10-CM | POA: Insufficient documentation

## 2014-11-14 DIAGNOSIS — D649 Anemia, unspecified: Secondary | ICD-10-CM | POA: Insufficient documentation

## 2014-11-14 DIAGNOSIS — Z79899 Other long term (current) drug therapy: Secondary | ICD-10-CM | POA: Diagnosis not present

## 2014-11-14 DIAGNOSIS — M549 Dorsalgia, unspecified: Secondary | ICD-10-CM | POA: Insufficient documentation

## 2014-11-14 DIAGNOSIS — R51 Headache: Secondary | ICD-10-CM | POA: Insufficient documentation

## 2014-11-14 DIAGNOSIS — Z8249 Family history of ischemic heart disease and other diseases of the circulatory system: Secondary | ICD-10-CM | POA: Insufficient documentation

## 2014-11-14 HISTORY — PX: LEFT HEART CATHETERIZATION WITH CORONARY ANGIOGRAM: SHX5451

## 2014-11-14 SURGERY — LEFT HEART CATHETERIZATION WITH CORONARY ANGIOGRAM
Anesthesia: LOCAL

## 2014-11-14 MED ORDER — SODIUM CHLORIDE 0.9 % IV SOLN: 1.0000 mL/kg/h | INTRAVENOUS | Status: DC

## 2014-11-14 MED ORDER — SODIUM CHLORIDE 0.9 % IJ SOLN: 3.0000 mL | Freq: Two times a day (BID) | INTRAMUSCULAR | Status: DC

## 2014-11-14 MED ORDER — SODIUM CHLORIDE 0.9 % IV SOLN
250.0000 mL | INTRAVENOUS | Status: DC | PRN
  Administered 2014-11-14: 10 mL/h via INTRAVENOUS

## 2014-11-14 MED ORDER — OXYCODONE-ACETAMINOPHEN 5-325 MG PO TABS: 1.0000 | ORAL_TABLET | ORAL | Status: DC | PRN

## 2014-11-14 MED ORDER — SODIUM CHLORIDE 0.9 % IV SOLN: 250.0000 mL | INTRAVENOUS | Status: DC | PRN

## 2014-11-14 MED ORDER — VERAPAMIL HCL 2.5 MG/ML IV SOLN
INTRAVENOUS | Status: AC
  Filled 2014-11-14: qty 2

## 2014-11-14 MED ORDER — MIDAZOLAM HCL 2 MG/2ML IJ SOLN
INTRAMUSCULAR | Status: AC
  Filled 2014-11-14: qty 2

## 2014-11-14 MED ORDER — ASPIRIN 81 MG PO CHEW
CHEWABLE_TABLET | ORAL | Status: AC
  Filled 2014-11-14: qty 1

## 2014-11-14 MED ORDER — HEPARIN (PORCINE) IN NACL 2-0.9 UNIT/ML-% IJ SOLN
INTRAMUSCULAR | Status: AC
  Filled 2014-11-14: qty 1000

## 2014-11-14 MED ORDER — NITROGLYCERIN 1 MG/10 ML FOR IR/CATH LAB
INTRA_ARTERIAL | Status: AC
  Filled 2014-11-14: qty 10

## 2014-11-14 MED ORDER — SODIUM CHLORIDE 0.9 % IJ SOLN: 3.0000 mL | INTRAMUSCULAR | Status: DC | PRN

## 2014-11-14 MED ORDER — ASPIRIN 81 MG PO CHEW
81.0000 mg | CHEWABLE_TABLET | ORAL | Status: AC
  Administered 2014-11-14: 81 mg via ORAL

## 2014-11-14 MED ORDER — LIDOCAINE HCL (PF) 1 % IJ SOLN
INTRAMUSCULAR | Status: AC
  Filled 2014-11-14: qty 30

## 2014-11-14 MED ORDER — HEPARIN SODIUM (PORCINE) 1000 UNIT/ML IJ SOLN
INTRAMUSCULAR | Status: AC
  Filled 2014-11-14: qty 1

## 2014-11-14 MED ORDER — FENTANYL CITRATE 0.05 MG/ML IJ SOLN
INTRAMUSCULAR | Status: AC
  Filled 2014-11-14: qty 2

## 2014-11-14 NOTE — Interval H&P Note (Signed)
History and Physical Interval Note:  11/14/2014 7:51 AM  [name]  has presented today for surgery, with the diagnosis of abnormal myoview, pre-op clearance  The various methods of treatment have been discussed with the patient and family. After consideration of risks, benefits and other options for treatment, the patient has consented to  Procedure(s): LEFT HEART CATHETERIZATION WITH CORONARY ANGIOGRAM (N/A) as a surgical intervention .  The patient's history has been reviewed, patient examined, no change in status, stable for surgery.  I have reviewed the patient's chart and labs.  Questions were answered to the patient's satisfaction.     Jenkins Rouge   Myovue read as intermediate risk with inferolateral wall defect SDS 9  Cath indicated to clear for moderate risk orthopedic surgery  Jenkins Rouge

## 2014-11-14 NOTE — H&P (View-Only) (Signed)
Patient ID: [name], female   DOB: [birth year], 80 y.o.   MRN: [id number]    80 yo referred by Dr Ronnald Ramp for dyspnea and preop clearance.  She has chronic back pain and was referred to Dr Rolena Infante.  She indicates he wouldn't operate on her Due to hardening of the arteries and calcified blood vessels.  Not clear to me what this was based on.  I reviewed a CT scan she had 6/15 and she does have some Calcification of her infra renal aorta but no aneurysm and no coronary artery calcium  She has functional chronic exertional dyspnea  Activity also limited by knee pain Dr Eulas Post has replaced left knee twice and done arthroscopic surgery on right but she will need a replacement soon.  Needs more pain management .  No history of CAD, vascular disease, claudication or TIA.  Non smoker non diabetic compliant with meds  No chest pain   Echo 12/14 benign  Aortic valve sclerosis not stenosis  Mean gradient 10 mmgh - Left ventricle: The cavity size was normal. Wall thickness was increased in a pattern of mild LVH. Systolic function was normal. The estimated ejection fraction was in the range of 60% to 65%. Wall motion was normal; there were no regional wall motion abnormalities. Doppler parameters are consistent with abnormal left ventricular relaxation (grade 1 diastolic dysfunction). Doppler parameters are consistent with elevated ventricular end-diastolic filling pressure. - Atrial septum: No defect or patent foramen ovale was identified.    ROS: Denies fever, malais, weight loss, blurry vision, decreased visual acuity, cough, sputum, SOB, hemoptysis, pleuritic pain, palpitaitons, heartburn, abdominal pain, melena, lower extremity edema, claudication, or rash.  All other systems reviewed and negative   General: Affect appropriate Overweight black female  HEENT: normal Neck supple with no adenopathy JVP normal no bruits no thyromegaly Lungs clear with no wheezing and good  diaphragmatic motion Heart:  S1/S2 SEM murmur,rub, gallop or click PMI normal Abdomen: benighn, BS positve, no tenderness, no AAA no bruit.  No HSM or HJR Distal pulses intact with no bruits No edema Neuro non-focal Skin warm and dry No muscular weakness  Medications Current Outpatient Prescriptions  Medication Sig Dispense Refill  . aspirin 81 MG chewable tablet Chew 1 tablet (81 mg total) by mouth daily. 30 tablet 1  . calcium carbonate (OS-CAL) 600 MG TABS tablet Take 600 mg by mouth 2 (two) times daily with a meal.    . Cholecalciferol (VITAMIN D3) 1000 UNITS CAPS Take 2 capsules (2,000 Units total) by mouth daily. 60 capsule 11  . gabapentin (NEURONTIN) 100 MG capsule Take 1 capsule (100 mg total) by mouth 3 (three) times daily. 90 capsule 5  . HYDROcodone-acetaminophen (NORCO) 10-325 MG per tablet Take 1 tablet by mouth every 6 (six) hours as needed for moderate pain. 75 tablet 0  . isosorbide-hydrALAZINE (BIDIL) 20-37.5 MG per tablet Take 0.5 tablets by mouth 2 (two) times daily. 30 tablet 11  . losartan (COZAAR) 100 MG tablet Take 1 tablet (100 mg total) by mouth daily. 90 tablet 3  . Multiple Minerals-Vitamins (CALCIUM & VIT D3 BONE HEALTH PO) Take 1 capsule by mouth daily.    . potassium chloride SA (K-DUR,KLOR-CON) 20 MEQ tablet Take 1 tablet (20 mEq total) by mouth 2 (two) times daily. 180 tablet 1  . pravastatin (PRAVACHOL) 80 MG tablet Take 80 mg by mouth daily.    . traZODone (DESYREL) 100 MG tablet Take 2 tablets (200 mg total) by mouth at bedtime.  180 tablet 3  . ALPRAZolam (XANAX) 1 MG tablet Take 1 tablet (1 mg total) by mouth 3 (three) times daily as needed for anxiety. (Patient not taking: Reported on 10/26/2014) 90 tablet 5  . meclizine (ANTIVERT) 25 MG tablet Take 25 mg by mouth 3 (three) times daily as needed for dizziness.    . promethazine (PHENERGAN) 12.5 MG tablet Take 1 tablet (12.5 mg total) by mouth every 6 (six) hours as needed for nausea. (Patient not  taking: Reported on 10/26/2014) 30 tablet 2   No current facility-administered medications for this visit.    Allergies Amlodipine; Enalapril; and Metformin  Family History: Family History  Problem Relation Age of Onset  . Alcohol abuse Other   . Hypertension Other   . Kidney disease Other   . Mental illness Other   . Cancer Father     prostate cancer  . Heart disease Sister   . Heart disease Brother   . Emphysema Brother     Social History: History   Social History  . Marital Status: Widowed    Spouse Name: N/A    Number of Children: 71  . Years of Education: N/A   Occupational History  . retired      Museum/gallery curator at [employer]  . Smoking status: Never Smoker   . Smokeless tobacco: Never Used  . Alcohol Use: No  . Drug Use: No  . Sexual Activity: Not Currently   Other Topics Concern  . Not on file   Social History Narrative    Past Surgical History  Procedure Laterality Date  . Appendectomy    . Cholecystectomy    . Abdominal hysterectomy    . Tonsillectomy    . Revision total knee arthroplasty  2011  . Rotator cuff repair  2010  . Tonsillectomy    . Joint replacement      LEFT KNEE   . Knee arthroscopy      RIGHT  . Shoulder arthroscopy  09/17/2012    Procedure: ARTHROSCOPY SHOULDER;  Surgeon: Sharmon Revere, MD;  Location: [facility];  Service: Orthopedics;  Laterality: Left;    Past Medical History  Diagnosis Date  . Hyperlipidemia   . Hypertension   . Osteoarthritis   . Wears dentures   . Bruises easily   . Diarrhea   . Abdominal pain   . Wears glasses   . Shortness of breath     WITH EXERTION USES 2 L O2 BEDTIME  . Oxygen desaturation during sleep     USES 2 LITERS BEDTIME   VIA CPAP 06/2012 [facility]   . Anxiety   . Depression   . Sleep apnea     CPAP WITH O2 2 LITERS 2013 ([facility])  . Diabetes mellitus     type II  DIET CONTROLLED  . Chronic headaches     HISTORY OF   . Anemia     NOS  . Hepatitis      AGE 30S  . Chronic diastolic CHF (congestive heart failure)     a. 06/2013 EF 65-70%.  . Mild aortic stenosis     a. 06/2013 Echo: EF 65-70%, mod LVH with focal basal hypertrophy, very mild AS.    Electrocardiogram:  SR rate 61 normal ECG read as septal infarct   Assessment and Plan

## 2014-11-14 NOTE — Discharge Instructions (Signed)
Radial Site Care °Refer to this sheet in the next few weeks. These instructions provide you with information on caring for yourself after your procedure. Your caregiver may also give you more specific instructions. Your treatment has been planned according to current medical practices, but problems sometimes occur. Call your caregiver if you have any problems or questions after your procedure. °HOME CARE INSTRUCTIONS °· You may shower the day after the procedure. Remove the bandage (dressing) and gently wash the site with plain soap and water. Gently pat the site dry. °· Do not apply powder or lotion to the site. °· Do not submerge the affected site in water for 3 to 5 days. °· Inspect the site at least twice daily. °· Do not flex or bend the affected arm for 24 hours. °· No lifting over 5 pounds (2.3 kg) for 5 days after your procedure. °· Do not drive home if you are discharged the same day of the procedure. Have someone else drive you. °· You may drive 24 hours after the procedure unless otherwise instructed by your caregiver. °· Do not operate machinery or power tools for 24 hours. °· A responsible adult should be with you for the first 24 hours after you arrive home. °What to expect: °· Any bruising will usually fade within 1 to 2 weeks. °· Blood that collects in the tissue (hematoma) may be painful to the touch. It should usually decrease in size and tenderness within 1 to 2 weeks. °SEEK IMMEDIATE MEDICAL CARE IF: °· You have unusual pain at the radial site. °· You have redness, warmth, swelling, or pain at the radial site. °· You have drainage (other than a small amount of blood on the dressing). °· You have chills. °· You have a fever or persistent symptoms for more than 72 hours. °· You have a fever and your symptoms suddenly get worse. °· Your arm becomes pale, cool, tingly, or numb. °· You have heavy bleeding from the site. Hold pressure on the site. °Document Released: 12/14/2010 Document Revised:  02/03/2012 Document Reviewed: 12/14/2010 °ExitCare® Patient Information ©2015 [employer]. This information is not intended to replace advice given to you by your health care provider. Make sure you discuss any questions you have with your health care provider. ° °

## 2014-11-14 NOTE — CV Procedure (Signed)
    Cardiac Catheterization Procedure Note  Name: [name] MRN: [id number] DOB: 08-12-1933  Procedure: Left Heart Cath, Selective Coronary Angiography, LV angiography  Indication:  Preop orthopedic surgery Intermediate risk myovue   Procedural Details: The right wrist was prepped, draped, and anesthetized with 1% lidocaine. Using the modified Seldinger technique, a 5/6 French Slender sheath was introduced into the right radial artery. 3 mg of verapamil was administered through the sheath, weight-based unfractionated heparin was administered intravenously. Standard Judkins catheters were used for selective coronary angiography and left ventriculography. Catheter exchanges were performed over an exchange length guidewire. There were no immediate procedural complications. A TR band was used for radial hemostasis at the completion of the procedure.  The patient was transferred to the post catheterization recovery area for further monitoring.  Procedural Findings: Hemodynamics: AO 159 64  LV 152 9  Coronary angiography: Coronary dominance: right  Left mainstem: Normal  Left anterior descending (LAD): 30% proximal  Normal mid and distal wraps apex   Left circumflex (LCx):  Dominant although the LAD supplies a good bit of the PDA territory Normal   OM1:  High take off normal  OM2:  Normal  Right coronary artery (RCA): Small and normal  Left ventriculography: Left ventricular systolic function is normal, LVEF is estimated at 55-65%, there was significant MR but likely catheter induced    Final Conclusions:   False positive myovue OK to proceed with orthopedic surgery.  No significant CAD will check echo regarding MR to make sure it is catheter induced She does not have loud murmur on exam  Recommendations:  Medical Rx   Jenkins Rouge MD, [facility] 11/14/2014, 9:37 AM

## 2014-11-17 LAB — ALDOSTERONE + RENIN ACTIVITY W/ RATIO
ALDO / PRA RATIO: 59.1 ratio — AB (ref 0.9–28.9)
Aldosterone: 13 ng/dL
PRA LC/MS/MS: 0.22 ng/mL/h — AB (ref 0.25–5.82)

## 2014-11-28 ENCOUNTER — Ambulatory Visit (INDEPENDENT_AMBULATORY_CARE_PROVIDER_SITE_OTHER): Payer: Commercial Managed Care - HMO | Admitting: Internal Medicine

## 2014-11-28 ENCOUNTER — Encounter: Payer: Self-pay | Admitting: Internal Medicine

## 2014-11-28 VITALS — BP 148/88 | HR 66 | Temp 97.5°F | Resp 16 | Ht 62.0 in | Wt 200.0 lb

## 2014-11-28 DIAGNOSIS — M150 Primary generalized (osteo)arthritis: Secondary | ICD-10-CM

## 2014-11-28 DIAGNOSIS — I10 Essential (primary) hypertension: Secondary | ICD-10-CM | POA: Diagnosis not present

## 2014-11-28 DIAGNOSIS — M545 Low back pain: Secondary | ICD-10-CM | POA: Diagnosis not present

## 2014-11-28 DIAGNOSIS — M159 Polyosteoarthritis, unspecified: Secondary | ICD-10-CM

## 2014-11-28 DIAGNOSIS — Z23 Encounter for immunization: Secondary | ICD-10-CM | POA: Diagnosis not present

## 2014-11-28 MED ORDER — OXYCODONE HCL ER 20 MG PO T12A: 20.0000 mg | EXTENDED_RELEASE_TABLET | Freq: Two times a day (BID) | ORAL | Status: DC

## 2014-11-28 NOTE — Patient Instructions (Signed)

## 2014-11-28 NOTE — Progress Notes (Signed)
   Subjective:    Patient ID: [name], female    DOB: Sep 26, 1933, 81 y.o.   MRN: [id number]  Arthritis Presents for follow-up visit. The disease course has been worsening. She complains of pain. She reports no stiffness, joint swelling or joint warmth. The symptoms have been worsening. Affected locations include the left knee and right knee. Her pain is at a severity of 6/10. Associated symptoms include pain at night and pain while resting. Pertinent negatives include no diarrhea, dry eyes, dry mouth, dysuria, fatigue, fever, rash, Raynaud's syndrome, uveitis or weight loss. Her past medical history is significant for osteoarthritis. Her pertinent risk factors include overuse. Past treatments include acetaminophen and an opioid. The treatment provided mild relief. Factors aggravating her arthritis include activity. Compliance with prior treatments has been good.      Review of Systems  Constitutional: Negative.  Negative for fever, chills, weight loss, diaphoresis, appetite change and fatigue.  HENT: Negative.   Eyes: Negative.   Respiratory: Negative.   Cardiovascular: Negative.  Negative for chest pain, palpitations and leg swelling.  Gastrointestinal: Negative.  Negative for nausea, vomiting, abdominal pain, diarrhea, constipation and blood in stool.  Endocrine: Negative.   Genitourinary: Negative.  Negative for dysuria.  Musculoskeletal: Positive for back pain, arthralgias and arthritis. Negative for myalgias, joint swelling, stiffness and neck pain.  Skin: Negative.  Negative for rash.  Allergic/Immunologic: Negative.   Neurological: Negative.   Hematological: Negative.  Negative for adenopathy. Does not bruise/bleed easily.  Psychiatric/Behavioral: Negative.        Objective:   Physical Exam  Constitutional: She is oriented to person, place, and time. She appears well-developed and well-nourished. No distress.  HENT:  Head: Normocephalic and atraumatic.  Mouth/Throat:  Oropharynx is clear and moist. No oropharyngeal exudate.  Eyes: Conjunctivae are normal. Right eye exhibits no discharge. Left eye exhibits no discharge. No scleral icterus.  Neck: Normal range of motion. Neck supple. No JVD present. No tracheal deviation present. No thyromegaly present.  Cardiovascular: Normal rate, regular rhythm, normal heart sounds and intact distal pulses.  Exam reveals no gallop and no friction rub.   No murmur heard. Pulmonary/Chest: Effort normal and breath sounds normal. No stridor. No respiratory distress. She has no wheezes. She has no rales. She exhibits no tenderness.  Abdominal: Soft. Bowel sounds are normal. She exhibits no distension and no mass. There is no tenderness. There is no rebound and no guarding.  Musculoskeletal: Normal range of motion. She exhibits no edema or tenderness.  Lymphadenopathy:    She has no cervical adenopathy.  Neurological: She is oriented to person, place, and time.  Skin: Skin is warm and dry. No rash noted. She is not diaphoretic. No erythema. No pallor.  Vitals reviewed.     Lab Results  Component Value Date   WBC 6.8 11/09/2014   HGB 12.6 11/09/2014   HCT 39.1 11/09/2014   PLT 164.0 11/09/2014   GLUCOSE 120* 11/09/2014   CHOL 209* 05/25/2014   TRIG 137.0 05/25/2014   HDL 65.60 05/25/2014   LDLDIRECT 150.1 04/15/2011   LDLCALC 116* 05/25/2014   ALT 11 05/25/2014   AST 15 05/25/2014   NA 140 11/09/2014   K 4.5 11/10/2014   CL 105 11/09/2014   CREATININE 0.8 11/09/2014   BUN 20 11/09/2014   CO2 26 11/09/2014   TSH 4.03 11/04/2014   INR 1.0 11/09/2014   HGBA1C 5.5 02/21/2014      Assessment & Plan:

## 2014-11-29 ENCOUNTER — Telehealth: Payer: Self-pay | Admitting: Internal Medicine

## 2014-11-29 NOTE — Telephone Encounter (Signed)
Pt calling all upset, stating she is pain and called her pharmacy and insurance and was told that  all MD's office needs to do is, make the call to insurance so this med (oxycodone) is approved. [phone]

## 2014-11-29 NOTE — Assessment & Plan Note (Signed)
Her BP is adequately well controlled 

## 2014-11-29 NOTE — Assessment & Plan Note (Signed)
Will oxycontin for pain control

## 2014-11-29 NOTE — Assessment & Plan Note (Signed)
She has inadequate pain relief with norco and is concerned about taking tylenol Will try oxycontin for pain control

## 2014-11-29 NOTE — Telephone Encounter (Signed)
Pt called stated that insurance need Dr. Ronnald Ramp to call with the approval for [name] to have this med (oxycodone), please call this number [phone]

## 2014-11-30 MED ORDER — OXYCODONE-ACETAMINOPHEN 7.5-325 MG PO TABS: 1.0000 | ORAL_TABLET | ORAL | Status: DC | PRN

## 2014-11-30 NOTE — Addendum Note (Signed)
Addended by: [name] on: 11/30/2014 12:47 PM   Modules accepted: Orders, Medications, SmartSet

## 2014-12-01 ENCOUNTER — Telehealth: Payer: Self-pay | Admitting: Internal Medicine

## 2014-12-01 NOTE — Telephone Encounter (Signed)
Needs to verify that PA was sent for OxyContin 20mg  Ref number [id number].

## 2014-12-05 DIAGNOSIS — M179 Osteoarthritis of knee, unspecified: Secondary | ICD-10-CM | POA: Diagnosis not present

## 2014-12-05 DIAGNOSIS — M25561 Pain in right knee: Secondary | ICD-10-CM | POA: Diagnosis not present

## 2014-12-08 ENCOUNTER — Other Ambulatory Visit: Payer: Self-pay | Admitting: Internal Medicine

## 2014-12-08 DIAGNOSIS — M5440 Lumbago with sciatica, unspecified side: Secondary | ICD-10-CM

## 2014-12-08 MED ORDER — GABAPENTIN 100 MG PO CAPS: 100.0000 mg | ORAL_CAPSULE | Freq: Three times a day (TID) | ORAL | Status: DC

## 2014-12-15 ENCOUNTER — Telehealth: Payer: Self-pay | Admitting: Internal Medicine

## 2014-12-15 DIAGNOSIS — M545 Low back pain: Secondary | ICD-10-CM

## 2014-12-15 DIAGNOSIS — M159 Polyosteoarthritis, unspecified: Secondary | ICD-10-CM

## 2014-12-15 DIAGNOSIS — M150 Primary generalized (osteo)arthritis: Principal | ICD-10-CM

## 2014-12-15 MED ORDER — OXYCODONE-ACETAMINOPHEN 7.5-325 MG PO TABS: 1.0000 | ORAL_TABLET | ORAL | Status: DC | PRN

## 2014-12-15 NOTE — Telephone Encounter (Signed)
Pt called in and requesting a higher strength on her oxyCODONE-acetaminophen (PERCOCET) 7.5-325 MG per tablet [[id number]].  She is requesting refill?

## 2014-12-15 NOTE — Telephone Encounter (Signed)
Pt.notified

## 2014-12-15 NOTE — Telephone Encounter (Signed)
done

## 2014-12-17 IMAGING — CR DG LUMBAR SPINE COMPLETE 4+V
6 series · 6 of 6 positions shown · non-contrast
Comparison: 04/06/2012

CLINICAL DATA: Right lower back pain.

LUMBAR SPINE - COMPLETE 4+ VIEW

[view not recorded (1 of 6)]
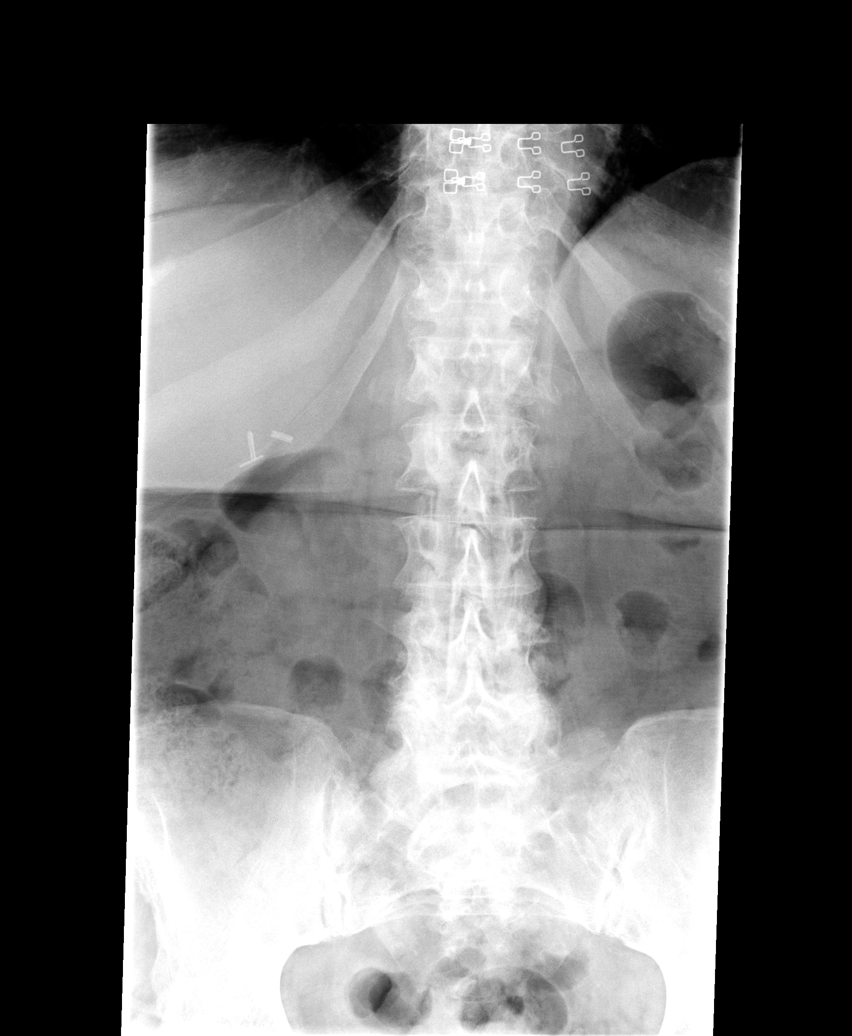

[view not recorded (2 of 6)]
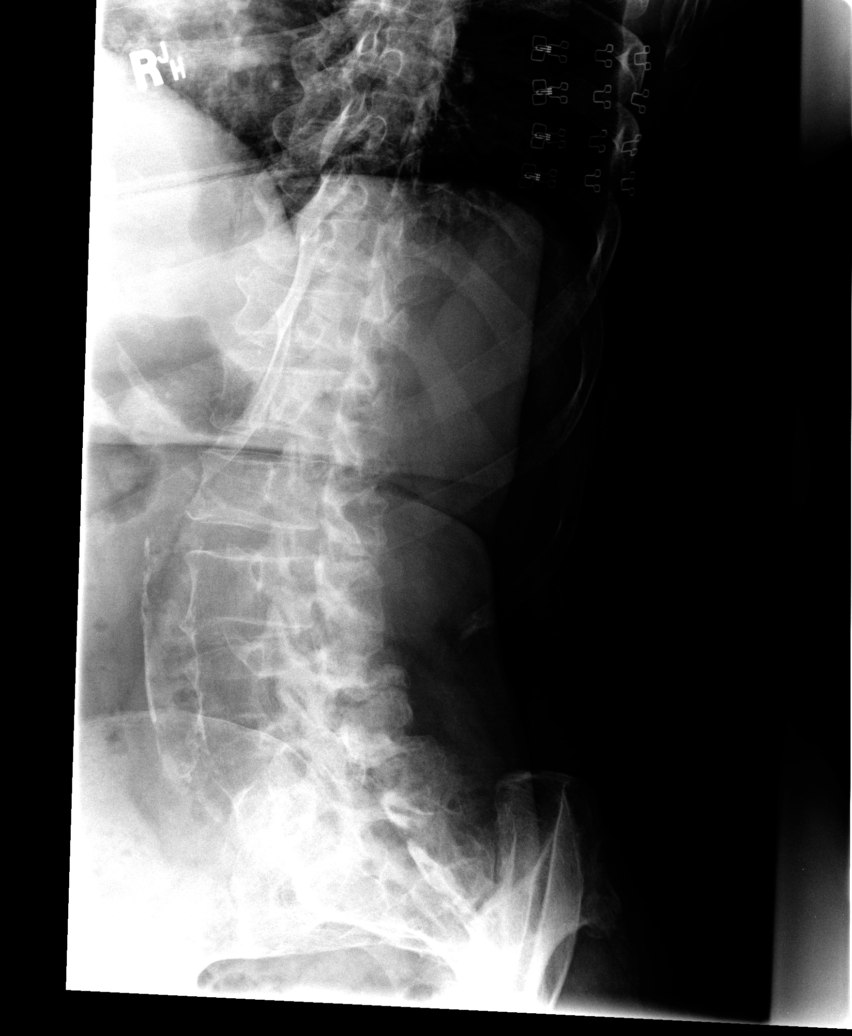

[view not recorded (3 of 6)]
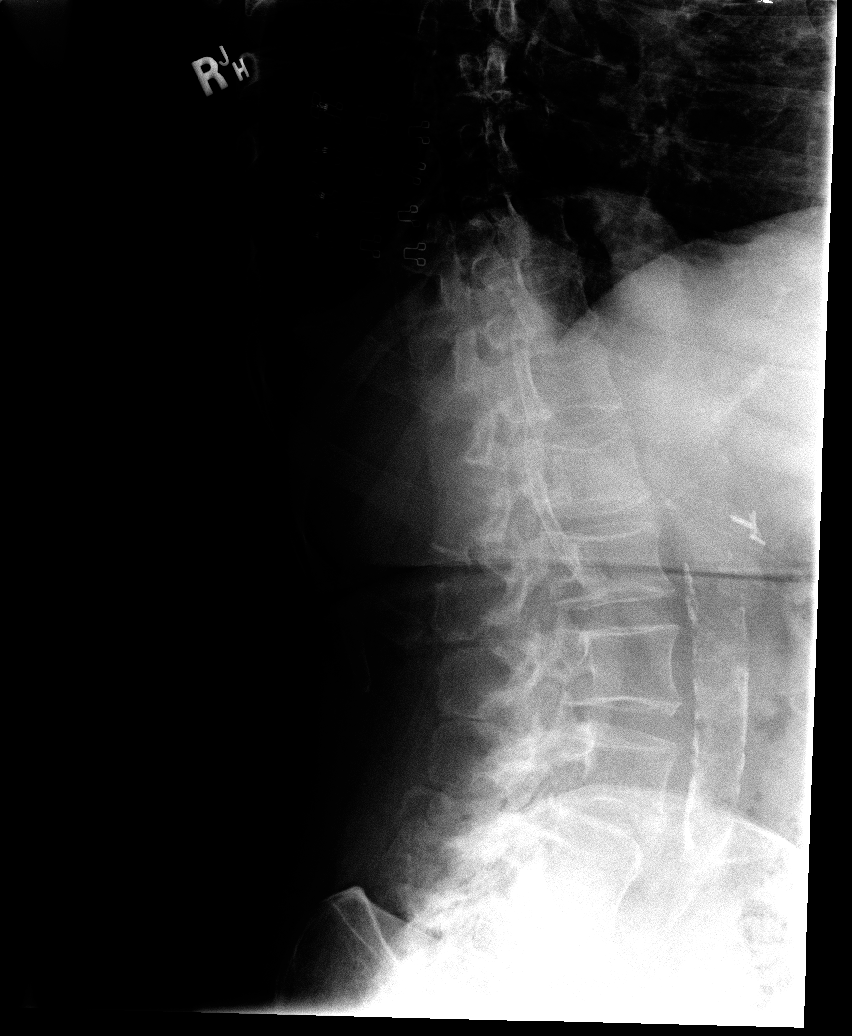

[view not recorded (4 of 6)]
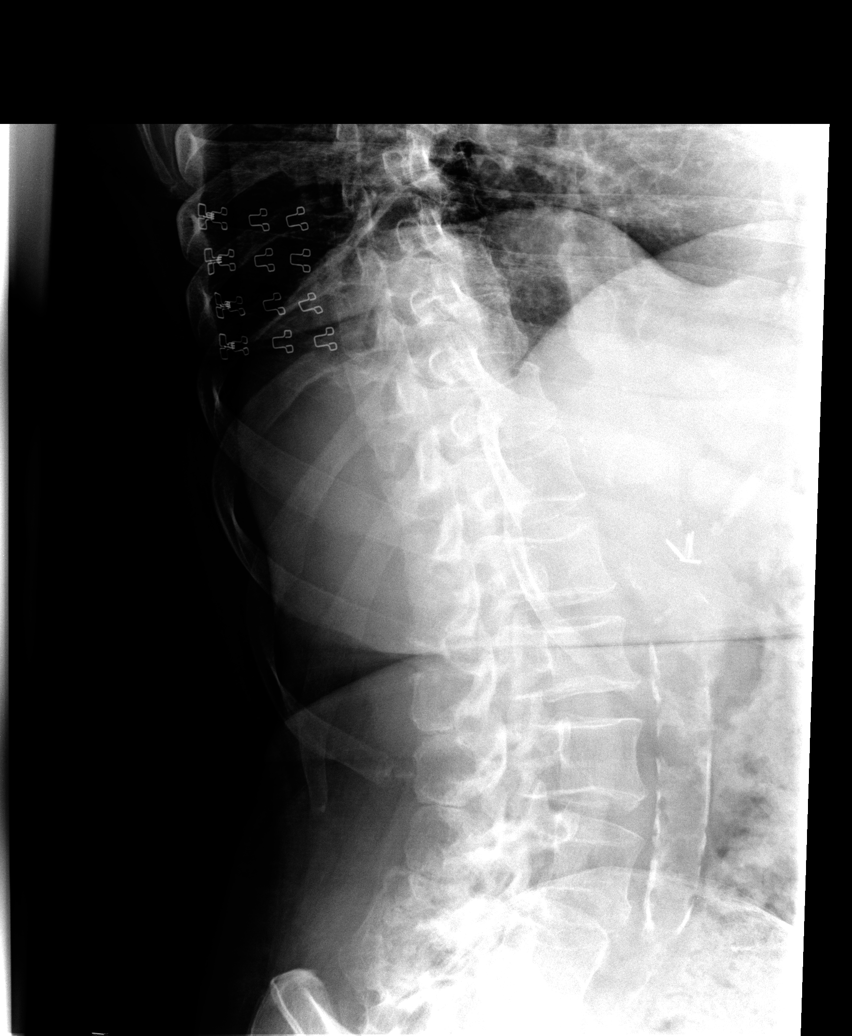

[view not recorded (5 of 6)]
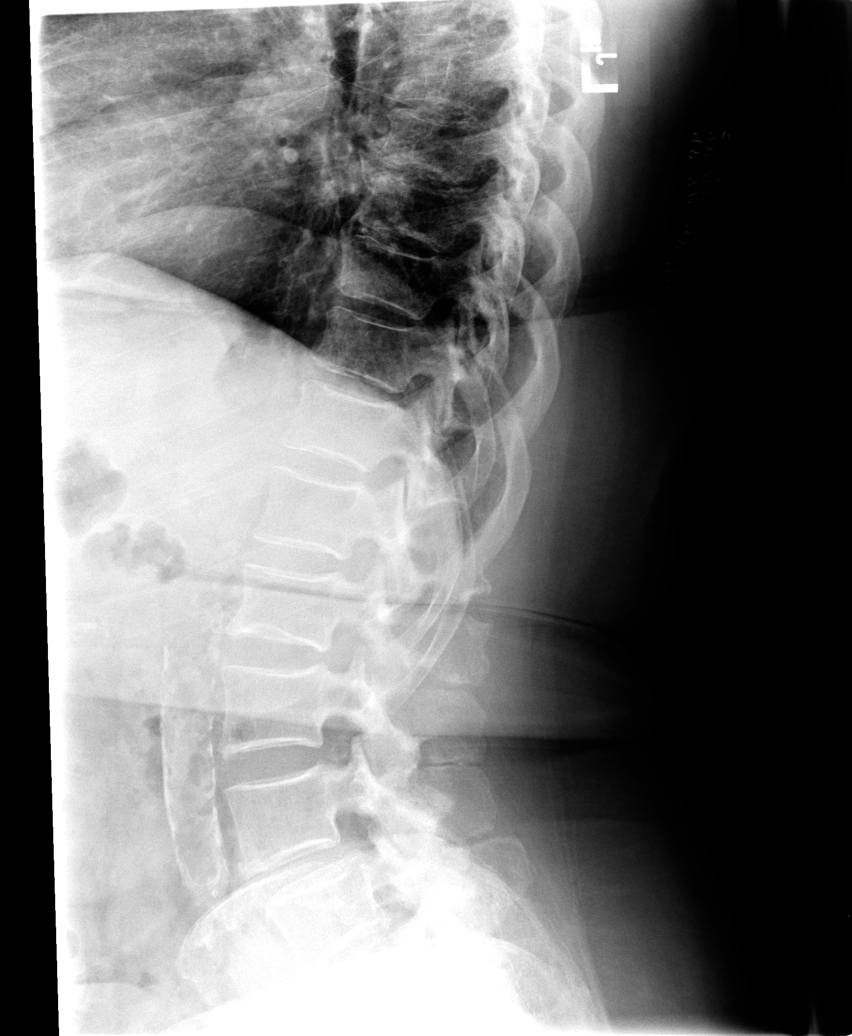

[view not recorded (6 of 6)]
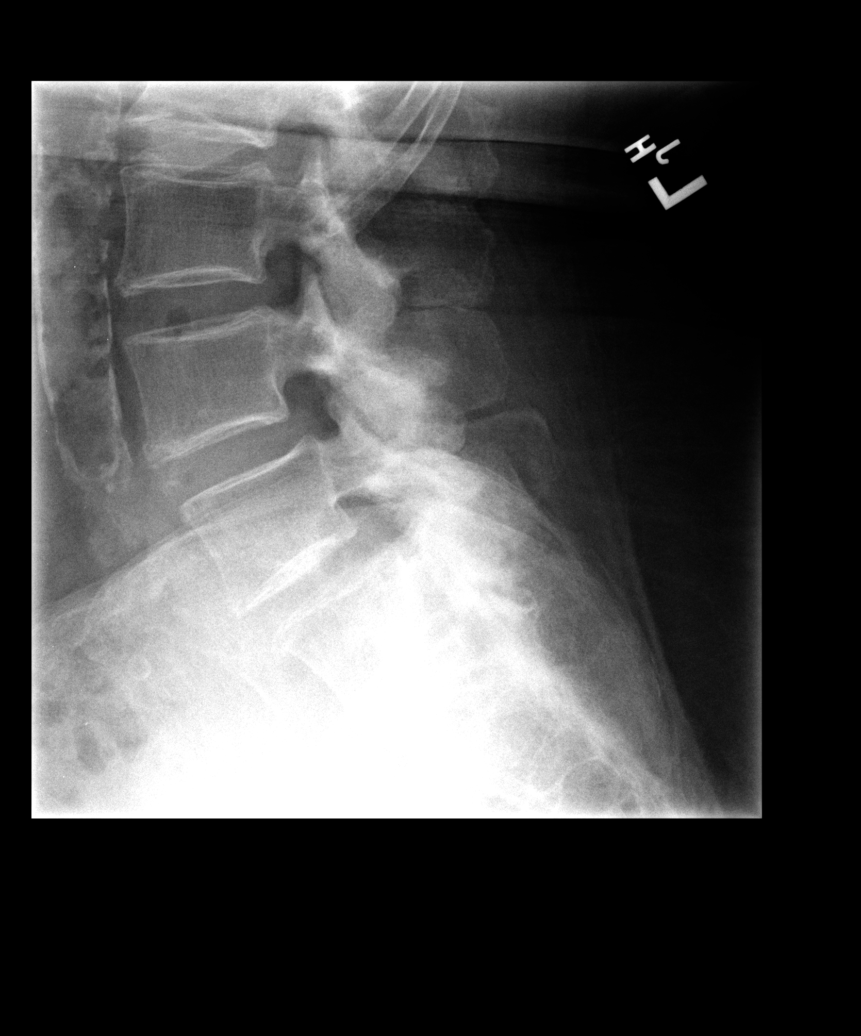

[6 of 6 positions shown; findings below may reference images not displayed]

FINDINGS: Moderate to advanced degenerative facet disease
throughout the lumbar spine, most pronounced from L3-4 through L5-
S1.  Slight anterolisthesis of L4 on L5 and L5 on S1.  No fracture.
Disc space narrowing at L5-S1.  SI joints are symmetric and
unremarkable.

Calcifications in the mid and lower abdominal aorta without visible
aneurysm.  Prior cholecystectomy.
IMPRESSION: Degenerative disc disease at L5-S1.  Advanced degenerative facet
disease as above.  No acute findings.

## 2014-12-28 ENCOUNTER — Telehealth: Payer: Self-pay | Admitting: Internal Medicine

## 2014-12-28 DIAGNOSIS — M150 Primary generalized (osteo)arthritis: Principal | ICD-10-CM

## 2014-12-28 DIAGNOSIS — M545 Low back pain: Secondary | ICD-10-CM

## 2014-12-28 DIAGNOSIS — M159 Polyosteoarthritis, unspecified: Secondary | ICD-10-CM

## 2014-12-28 MED ORDER — OXYCODONE-ACETAMINOPHEN 7.5-325 MG PO TABS: 1.0000 | ORAL_TABLET | ORAL | Status: DC | PRN

## 2014-12-28 NOTE — Telephone Encounter (Signed)
Refill written She needs to come pick it up

## 2014-12-28 NOTE — Telephone Encounter (Signed)
This pt called in said that she has only 5 days left of her Oxycodone and wanted me to go head and tell Dr Ronnald Ramp she is going to need a refill next week.  She said that she panics if it is a weekend and she dont have it.  She is taking one every 4 hours .   It is to early for refill?

## 2014-12-29 NOTE — Telephone Encounter (Signed)
Pt.notified

## 2015-01-09 ENCOUNTER — Telehealth: Payer: Self-pay | Admitting: Internal Medicine

## 2015-01-09 DIAGNOSIS — M159 Polyosteoarthritis, unspecified: Secondary | ICD-10-CM

## 2015-01-09 DIAGNOSIS — M150 Primary generalized (osteo)arthritis: Principal | ICD-10-CM

## 2015-01-09 DIAGNOSIS — M545 Low back pain: Secondary | ICD-10-CM

## 2015-01-09 NOTE — Telephone Encounter (Signed)
Patient requesting refill of oxyCODONE-acetaminophen (PERCOCET) 7.5-325 MG per tablet [[id number]]

## 2015-01-10 ENCOUNTER — Encounter: Payer: Self-pay | Admitting: Physical Medicine & Rehabilitation

## 2015-01-10 MED ORDER — OXYCODONE-ACETAMINOPHEN 7.5-325 MG PO TABS: 1.0000 | ORAL_TABLET | ORAL | Status: DC | PRN

## 2015-01-10 NOTE — Telephone Encounter (Signed)
done

## 2015-01-16 DIAGNOSIS — M179 Osteoarthritis of knee, unspecified: Secondary | ICD-10-CM | POA: Diagnosis not present

## 2015-01-24 ENCOUNTER — Telehealth: Payer: Self-pay | Admitting: *Deleted

## 2015-01-24 DIAGNOSIS — M150 Primary generalized (osteo)arthritis: Principal | ICD-10-CM

## 2015-01-24 DIAGNOSIS — M545 Low back pain: Secondary | ICD-10-CM

## 2015-01-24 DIAGNOSIS — M159 Polyosteoarthritis, unspecified: Secondary | ICD-10-CM

## 2015-01-24 MED ORDER — OXYCODONE-ACETAMINOPHEN 7.5-325 MG PO TABS: 1.0000 | ORAL_TABLET | ORAL | Status: DC | PRN

## 2015-01-24 NOTE — Telephone Encounter (Signed)
done

## 2015-01-24 NOTE — Telephone Encounter (Signed)
Left msg on triage requesting refill on her oxycodone...[name]

## 2015-01-24 NOTE — Telephone Encounter (Signed)
Pt notified to pick up and rx placed upfront

## 2015-02-06 DIAGNOSIS — M179 Osteoarthritis of knee, unspecified: Secondary | ICD-10-CM | POA: Diagnosis not present

## 2015-02-10 ENCOUNTER — Encounter: Payer: Self-pay | Admitting: Physical Medicine & Rehabilitation

## 2015-02-10 ENCOUNTER — Telehealth: Payer: Self-pay | Admitting: Internal Medicine

## 2015-02-10 ENCOUNTER — Other Ambulatory Visit: Payer: Self-pay | Admitting: Physical Medicine & Rehabilitation

## 2015-02-10 ENCOUNTER — Encounter
Payer: Commercial Managed Care - HMO | Attending: Physical Medicine & Rehabilitation | Admitting: Physical Medicine & Rehabilitation

## 2015-02-10 VITALS — BP 135/62 | HR 67 | Resp 14

## 2015-02-10 DIAGNOSIS — M5440 Lumbago with sciatica, unspecified side: Secondary | ICD-10-CM | POA: Diagnosis not present

## 2015-02-10 DIAGNOSIS — M159 Polyosteoarthritis, unspecified: Secondary | ICD-10-CM

## 2015-02-10 DIAGNOSIS — M1711 Unilateral primary osteoarthritis, right knee: Secondary | ICD-10-CM

## 2015-02-10 DIAGNOSIS — Z79899 Other long term (current) drug therapy: Secondary | ICD-10-CM

## 2015-02-10 DIAGNOSIS — Z5181 Encounter for therapeutic drug level monitoring: Secondary | ICD-10-CM | POA: Diagnosis not present

## 2015-02-10 DIAGNOSIS — M5416 Radiculopathy, lumbar region: Secondary | ICD-10-CM

## 2015-02-10 DIAGNOSIS — M545 Low back pain: Secondary | ICD-10-CM

## 2015-02-10 DIAGNOSIS — M47816 Spondylosis without myelopathy or radiculopathy, lumbar region: Secondary | ICD-10-CM | POA: Diagnosis not present

## 2015-02-10 DIAGNOSIS — M150 Primary generalized (osteo)arthritis: Principal | ICD-10-CM

## 2015-02-10 MED ORDER — GABAPENTIN 300 MG PO CAPS: 300.0000 mg | ORAL_CAPSULE | Freq: Two times a day (BID) | ORAL | Status: DC

## 2015-02-10 MED ORDER — OXYCODONE-ACETAMINOPHEN 7.5-325 MG PO TABS: 1.0000 | ORAL_TABLET | ORAL | Status: DC | PRN

## 2015-02-10 NOTE — Telephone Encounter (Signed)
Done hardcopy to Cherina  

## 2015-02-10 NOTE — Telephone Encounter (Signed)
Dr. Ronnald Ramp referred patient to Dr. Naaman Plummer for pain management.  Patient states Dr. Naaman Plummer does not give pain medicine on the first visit.  Patient is out of oxycodone 7.5 and states she has gotten a script from Dr. Jenny Reichmann before.  She is requesting Dr. Jenny Reichmann to write her a script since Dr. Ronnald Ramp is out of the office today.  Please advise.

## 2015-02-10 NOTE — Progress Notes (Signed)
Subjective:    Patient ID: [name], female    DOB: [birth year], 81 y.o.   MRN: [id number]  HPI   Mrs. Lumb is here regarding chronic low back and right knee pain. She has been having pain at least 2 years in her low back but in the past year the pain has accelerated. The pain is in her low back with radiation down the back of her legs. Her feet are numb. It has affected her sleep as she wakes up frequently hurting in the middle of the night. She is lucky to get 5 hours of broken sleep. It has gotten to the point that her back is hurting in most positions. She has been followed by Dr. Melina Schools who states that her back is non-operative.   Her most recent MRI from 10/15 revealed:  L3-4: Borderline right subarticular lateral recess stenosis due to disc bulge and facet arthropathy. Suspected tiny right synovial cyst along the ligamentum flavum.  L4-5: Moderate central narrowing of the thecal sac with moderate left and borderline right subarticular lateral recess stenosis due to facet arthropathy, ligamentum flavum redundancy, and disc uncovering. Bilateral posterior synovial cysts are not in a position to cause impingement. The left subarticular lateral recess stenosis has worsened in the interim.  L5-S1: Mild left and borderline right subarticular lateral recess stenosis and borderline central narrowing of the thecal sac due to facet arthropathy and disc uncovering.  Her right knee has bothering her more over the last year. It is popping and "cracking" when she bears weight or bends the knee. The knee has given out several times. She uses a cane to keep from falling. She had a left TKA 10 years ago. She needs a right TKA now apparently. She has an appt to see Dr. Rodell Perna soon, apparently. She has had numerous shots, braces, and therapy for the right knee.    She has had RTC surgery x2 on the left.    For pain she is taking percocet 7.5/325 q4 prn----typically about 5-6 per  day.  She was taken off gabapentin as it wasn't helping at all. She occasionally will use heat or ice.     Pain Inventory Average Pain 8 Pain Right Now 8 My pain is constant, sharp, dull, stabbing and aching  In the last 24 hours, has pain interfered with the following? General activity 8 Relation with others 6 Enjoyment of life 10 What TIME of day is your pain at its worst? morning Sleep (in general) Poor  Pain is worse with: walking, bending, sitting and standing Pain improves with: medication Relief from Meds: 4  Mobility walk with assistance use a cane how many minutes can you walk? 5 ability to climb steps?  yes do you drive?  yes Do you have any goals in this area?  yes  Function disabled: date disabled . Do you have any goals in this area?  yes  Neuro/Psych bladder control problems weakness numbness trouble walking dizziness anxiety  Prior Studies Any changes since last visit?  no  Physicians involved in your care Primary care Dr. Scarlette Calico Neurosurgeon Dr. Rolena Infante Orthopedist Dr. Marily Memos   Family History  Problem Relation Age of Onset  . Alcohol abuse Other   . Hypertension Other   . Kidney disease Other   . Mental illness Other   . Cancer Father     prostate cancer  . Heart disease Sister   . Heart disease Brother   . Emphysema  Brother    History   Social History  . Marital Status: Widowed    Spouse Name: N/A  . Number of Children: 5  . Years of Education: N/A   Occupational History  . retired      Museum/gallery curator at [employer]  . Smoking status: Never Smoker   . Smokeless tobacco: Never Used  . Alcohol Use: No  . Drug Use: No  . Sexual Activity: Not Currently   Other Topics Concern  . Not on file   Social History Narrative   Past Surgical History  Procedure Laterality Date  . Appendectomy    . Cholecystectomy    . Abdominal hysterectomy    . Tonsillectomy    . Revision total knee  arthroplasty  2011  . Rotator cuff repair  2010  . Tonsillectomy    . Joint replacement      LEFT KNEE   . Knee arthroscopy      RIGHT  . Shoulder arthroscopy  09/17/2012    Procedure: ARTHROSCOPY SHOULDER;  Surgeon: Sharmon Revere, MD;  Location: [place];  Service: Orthopedics;  Laterality: Left;  . Left and right heart catheterization with coronary angiogram N/A 10/28/2013    Procedure: LEFT AND RIGHT HEART CATHETERIZATION WITH CORONARY ANGIOGRAM;  Surgeon: Blane Ohara, MD;  Location: [facility] CATH LAB;  Service: Cardiovascular;  Laterality: N/A;  . Left heart catheterization with coronary angiogram N/A 11/14/2014    Procedure: LEFT HEART CATHETERIZATION WITH CORONARY ANGIOGRAM;  Surgeon: Josue Hector, MD;  Location: [facility] CATH LAB;  Service: Cardiovascular;  Laterality: N/A;   Past Medical History  Diagnosis Date  . Hyperlipidemia   . Hypertension   . Osteoarthritis   . Wears dentures   . Bruises easily   . Diarrhea   . Abdominal pain   . Wears glasses   . Shortness of breath     WITH EXERTION USES 2 L O2 BEDTIME  . Oxygen desaturation during sleep     USES 2 LITERS BEDTIME   VIA CPAP 06/2012 [facility]   . Anxiety   . Depression   . Sleep apnea     CPAP WITH O2 2 LITERS 2013 ([facility])  . Diabetes mellitus     type II  DIET CONTROLLED  . Chronic headaches     HISTORY OF   . Anemia     NOS  . Hepatitis     AGE 30S  . Chronic diastolic CHF (congestive heart failure)     a. 06/2013 EF 65-70%.  . Mild aortic stenosis     a. 06/2013 Echo: EF 65-70%, mod LVH with focal basal hypertrophy, very mild AS.   There were no vitals taken for this visit.  Opioid Risk Score:   Fall Risk Score:  `1  Depression screen PHQ 2/9  Depression screen [facility] 2/9 04/18/2013 04/16/2013 04/05/2013  Decreased Interest 0 0 0  Down, Depressed, Hopeless 0 0 0  PHQ - 2 Score 0 0 0    Review of Systems  Respiratory: Positive for cough and shortness of breath.   Gastrointestinal: Positive for nausea,  abdominal pain and constipation.  Musculoskeletal: Positive for gait problem.  Neurological: Positive for dizziness, weakness and numbness.  Psychiatric/Behavioral: The patient is nervous/anxious.   All other systems reviewed and are negative.      Objective:   Physical Exam  General: Alert and oriented x 3, No apparent distress. obese HEENT: Head is normocephalic, atraumatic, PERRLA,  EOMI, sclera anicteric, oral mucosa pink and moist, dentition intact, ext ear canals clear,  Neck: Supple without JVD or lymphadenopathy Heart: Reg rate and rhythm. No murmurs rubs or gallops Chest: CTA bilaterally without wheezes, rales, or rhonchi; no distress Abdomen: Soft, non-tender, non-distended, bowel sounds positive. Extremities: No clubbing, cyanosis, or edema. Pulses are 2+ Skin: Clean and intact without signs of breakdown Neuro: Pt is cognitively appropriate with normal insight, memory, and awareness. Cranial nerves 2-12 are intact. Sensory exam is normal except for diminished LT in both feet. . Reflexes are 1+ in all 4's. Fine motor coordination is intact. No tremors. Motor function is grossly 5/5 in the upper ext with some limitations at the left shoulder. LES grossly 3+HF, RKE 3-, LKE 4 ankles 4/5. Marland Kitchen  Musculoskeletal:  Right knee with substantial crepitus and pain with AROM and PROM. Substantial antalgia noted as well when standing. She tends to lean to the left to off load the leg. Medial joint line pain and effusion was also seen. Lumbar spine was tender to palpation. She was rotated left and bending to left at baseline. She could not flex, lat bend or extend past about 20 degrees in any plane. She used her right arm to support herself on the chair when she attempted flexion. SST was positive.  Psych: Pt's affect is appropriate. Pt is cooperative         Assessment & Plan:  1. Chronic low back pain, lumbar spondylosis 2. Endstage OA right knee 3. Hx of left TKA 4. Left RTC disease. 5.  Morbid obesity  Plan: 1. Will retry gabapentin with a different sig---300mg  qhs initially. This should help with her radicular pain and sleep as well.  If after one week it is not helping her, I would like to increase to BID. I asked her to observe closely for sedation or other neuro side effects 2. I am willing to write narcotics for Mrs. Whetsel. However, a UDS will be required per our protocol before I start writing them. I would recommend starting her on a mild long acting agent such as MS Contin 15mg  q12 along with a reduced schedule of breakthrough medication 3. Utilize ice,heat 4. ?Right TKA per orthopedics--I would hope that improvement in the functionality of her right knee would help with her low back pain.  5. Forty-five minutes of face to face patient care time were spent during this visit. All questions were encouraged and answered.  Follow up with me or NP in 1 month.

## 2015-02-10 NOTE — Patient Instructions (Signed)
TAKE GABAPENTIN 300MG  AT NIGHT FOR ONE WEEK. IF IT IS NOT HELPING AND YOU'RE TOLERATING THE MEDICINE OK, THEN YOU CAN INCREASE TO TWICE DAILY.   ONCE I HAVE CONFIRMATION THAT YOUR URINE SPECIMEN IS CONSISTENT WITH YOUR HISTORY AND PRESCRIBED MEDICATIONS, I WILL BE WILLING TO PRESCRIBE YOUR PAIN MEDICATION. THE RESULTS OF YOUR URINE TESTING COULD TAKE A WEEK OR MORE TO RETURN, HOWEVER.  IF WE DO NOT CONTACT YOU REGARDING THESE RESULTS WITHIN 10 DAYS, PLEASE CONTACT us.

## 2015-02-11 LAB — PMP ALCOHOL METABOLITE (ETG): Ethyl Glucuronide (EtG): NEGATIVE ng/mL

## 2015-02-13 NOTE — Telephone Encounter (Signed)
Pt has been made aware rx ready for pick-up. Closing encounter...[name]

## 2015-02-17 LAB — PRESCRIPTION MONITORING PROFILE (SOLSTAS)
BUPRENORPHINE, URINE: NEGATIVE ng/mL
Barbiturate Screen, Urine: NEGATIVE ng/mL
CANNABINOID SCRN UR: NEGATIVE ng/mL
COCAINE METABOLITES: NEGATIVE ng/mL
CREATININE, URINE: 274.78 mg/dL (ref >20.0)
Carisoprodol, Urine: NEGATIVE ng/mL
ECSTASY: NEGATIVE ng/mL
MEPERIDINE UR: NEGATIVE ng/mL
Methadone Screen, Urine: NEGATIVE ng/mL
Nitrites, Initial: NEGATIVE ug/mL
Propoxyphene: NEGATIVE ng/mL
TAPENTADOLUR: NEGATIVE ng/mL
Tramadol Scrn, Ur: NEGATIVE ng/mL
Zolpidem, Urine: NEGATIVE ng/mL
pH, Initial: 5.4 pH (ref 4.5–8.9)

## 2015-02-17 LAB — AMPHETAMINES (GC/LC/MS), URINE
Amphetamine GC/MS Conf: NEGATIVE ng/mL (ref <250)
METHAMPHETAMINE QUANT UR: NEGATIVE ng/mL (ref <250)

## 2015-02-17 LAB — BENZODIAZEPINES (GC/LC/MS), URINE
Alprazolam metabolite (GC/LC/MS), ur confirm: 4052 ng/mL (ref <25)
CLONAZEPAU: NEGATIVE ng/mL (ref <25)
Flurazepam metabolite (GC/LC/MS), ur confirm: NEGATIVE ng/mL (ref <50)
Lorazepam (GC/LC/MS), ur confirm: NEGATIVE ng/mL (ref <50)
Midazolam (GC/LC/MS), ur confirm: NEGATIVE ng/mL (ref <50)
NORDIAZEPAMU: NEGATIVE ng/mL (ref <50)
Oxazepam (GC/LC/MS), ur confirm: NEGATIVE ng/mL (ref <50)
Temazepam (GC/LC/MS), ur confirm: NEGATIVE ng/mL (ref <50)
Triazolam metabolite (GC/LC/MS), ur confirm: NEGATIVE ng/mL (ref <50)

## 2015-02-17 LAB — OXYCODONE, URINE (LC/MS-MS)
Noroxycodone, Ur: >10000 ng/mL (ref <50)
OXYMORPHONE, URINE: 9296 ng/mL (ref <50)
Oxycodone, ur: 24241 ng/mL (ref <50)

## 2015-02-17 LAB — OPIATES/OPIOIDS (LC/MS-MS)
Codeine Urine: NEGATIVE ng/mL (ref <50)
Hydrocodone: NEGATIVE ng/mL (ref <50)
Hydromorphone: NEGATIVE ng/mL (ref <50)
Morphine Urine: NEGATIVE ng/mL (ref <50)
Norhydrocodone, Ur: NEGATIVE ng/mL (ref <50)
Noroxycodone, Ur: >10000 ng/mL (ref <50)
OXYCODONE, UR: 24241 ng/mL (ref <50)
OXYMORPHONE, URINE: 9296 ng/mL (ref <50)

## 2015-02-17 LAB — FENTANYL (GC/LC/MS), URINE
Fentanyl, confirm: NEGATIVE ng/mL (ref <0.5)
Norfentanyl, confirm: NEGATIVE ng/mL (ref <0.5)

## 2015-02-27 ENCOUNTER — Other Ambulatory Visit: Payer: Commercial Managed Care - HMO

## 2015-02-27 ENCOUNTER — Ambulatory Visit (INDEPENDENT_AMBULATORY_CARE_PROVIDER_SITE_OTHER): Payer: Commercial Managed Care - HMO | Admitting: Internal Medicine

## 2015-02-27 VITALS — BP 128/82 | HR 69 | Temp 98.1°F | Resp 16 | Ht 62.0 in | Wt 189.0 lb

## 2015-02-27 DIAGNOSIS — Z79899 Other long term (current) drug therapy: Secondary | ICD-10-CM | POA: Diagnosis not present

## 2015-02-27 DIAGNOSIS — N3000 Acute cystitis without hematuria: Secondary | ICD-10-CM

## 2015-02-27 DIAGNOSIS — K589 Irritable bowel syndrome without diarrhea: Secondary | ICD-10-CM | POA: Diagnosis not present

## 2015-02-27 DIAGNOSIS — I10 Essential (primary) hypertension: Secondary | ICD-10-CM

## 2015-02-27 DIAGNOSIS — M159 Polyosteoarthritis, unspecified: Secondary | ICD-10-CM

## 2015-02-27 DIAGNOSIS — M150 Primary generalized (osteo)arthritis: Secondary | ICD-10-CM

## 2015-02-27 DIAGNOSIS — M545 Low back pain: Secondary | ICD-10-CM

## 2015-02-27 DIAGNOSIS — N3001 Acute cystitis with hematuria: Secondary | ICD-10-CM | POA: Insufficient documentation

## 2015-02-27 DIAGNOSIS — M4726 Other spondylosis with radiculopathy, lumbar region: Secondary | ICD-10-CM

## 2015-02-27 LAB — POCT URINALYSIS DIPSTICK
Bilirubin, UA: NEGATIVE
Glucose, UA: NEGATIVE
KETONES UA: NEGATIVE
NITRITE UA: NEGATIVE
Spec Grav, UA: >=1.03
UROBILINOGEN UA: 0.2
pH, UA: 5

## 2015-02-27 MED ORDER — ELUXADOLINE 75 MG PO TABS: 1.0000 | ORAL_TABLET | Freq: Two times a day (BID) | ORAL | Status: DC

## 2015-02-27 MED ORDER — OXYCODONE-ACETAMINOPHEN 7.5-325 MG PO TABS: 1.0000 | ORAL_TABLET | ORAL | Status: DC | PRN

## 2015-02-27 MED ORDER — CIPROFLOXACIN HCL 250 MG PO TABS
250.0000 mg | ORAL_TABLET | Freq: Two times a day (BID) | ORAL | Status: AC
Start: 2015-02-27 — End: 2015-03-04

## 2015-02-27 NOTE — Patient Instructions (Signed)

## 2015-02-27 NOTE — Progress Notes (Signed)
Subjective:    Patient ID: [name], female    DOB: [birth year], 81 y.o.   MRN: [id number]  Dysuria  This is a recurrent problem. The current episode started in the past 7 days. The problem occurs intermittently. The problem has been unchanged. The quality of the pain is described as burning. The pain is at a severity of 1/10. The patient is experiencing no pain. There has been no fever. The fever has been present for less than 1 day. She is not sexually active. There is no history of pyelonephritis. Associated symptoms include chills, frequency and urgency. Pertinent negatives include no discharge, flank pain, hematuria, hesitancy, nausea, sweats or vomiting. She has tried nothing for the symptoms. The treatment provided no relief. Her past medical history is significant for recurrent UTIs.      Review of Systems  Constitutional: Positive for chills. Negative for fever, diaphoresis, appetite change and fatigue.  HENT: Negative.   Eyes: Negative.   Respiratory: Negative.  Negative for cough, choking, chest tightness, shortness of breath and stridor.   Cardiovascular: Negative.  Negative for chest pain, palpitations and leg swelling.  Gastrointestinal: Positive for diarrhea. Negative for nausea, vomiting, abdominal pain, constipation, blood in stool, anal bleeding and rectal pain.       For years she has had intermittent episodes of crampy abd pain with diarrhea  Endocrine: Negative.   Genitourinary: Positive for dysuria, urgency and frequency. Negative for hesitancy, hematuria, flank pain, decreased urine volume, vaginal bleeding and pelvic pain.  Musculoskeletal: Positive for back pain and arthralgias. Negative for joint swelling, gait problem and neck pain.  Skin: Negative.   Allergic/Immunologic: Negative.   Neurological: Negative.  Negative for dizziness, tremors, syncope, light-headedness and numbness.  Hematological: Negative.  Negative for adenopathy. Does not bruise/bleed easily.   Psychiatric/Behavioral: Negative.        Objective:   Physical Exam  Constitutional: She is oriented to person, place, and time. She appears well-developed and well-nourished. No distress.  HENT:  Head: Normocephalic and atraumatic.  Mouth/Throat: Oropharynx is clear and moist. No oropharyngeal exudate.  Eyes: Conjunctivae are normal. Right eye exhibits no discharge. Left eye exhibits no discharge. No scleral icterus.  Neck: Normal range of motion. Neck supple. No JVD present. No tracheal deviation present. No thyromegaly present.  Cardiovascular: Normal rate, regular rhythm and intact distal pulses.  Exam reveals no gallop, no S3, no S4 and no friction rub.   Murmur heard.  Systolic murmur is present with a grade of 1/6   No diastolic murmur is present  Pulmonary/Chest: Effort normal and breath sounds normal. No stridor. No respiratory distress. She has no wheezes. She has no rales. She exhibits no tenderness.  Abdominal: Soft. Bowel sounds are normal. She exhibits no distension and no mass. There is no tenderness. There is no rebound and no guarding.  Musculoskeletal: Normal range of motion. She exhibits no edema or tenderness.  Lymphadenopathy:    She has no cervical adenopathy.  Neurological: She is oriented to person, place, and time.  Skin: Skin is warm and dry. No rash noted. She is not diaphoretic. No erythema. No pallor.  Psychiatric: She has a normal mood and affect. Her behavior is normal. Judgment and thought content normal.  Vitals reviewed.    Lab Results  Component Value Date   WBC 6.8 11/09/2014   HGB 12.6 11/09/2014   HCT 39.1 11/09/2014   PLT 164.0 11/09/2014   GLUCOSE 120* 11/09/2014   CHOL 209* 05/25/2014  TRIG 137.0 05/25/2014   HDL 65.60 05/25/2014   LDLDIRECT 150.1 04/15/2011   LDLCALC 116* 05/25/2014   ALT 11 05/25/2014   AST 15 05/25/2014   NA 140 11/09/2014   K 4.5 11/10/2014   CL 105 11/09/2014   CREATININE 0.8 11/09/2014   BUN 20 11/09/2014    CO2 26 11/09/2014   TSH 4.03 11/04/2014   INR 1.0 11/09/2014   HGBA1C 5.5 02/21/2014       Assessment & Plan:

## 2015-02-27 NOTE — Progress Notes (Signed)
Pre visit review using our clinic review tool, if applicable. No additional management support is needed unless otherwise documented below in the visit note. 

## 2015-02-28 ENCOUNTER — Encounter: Payer: Self-pay | Admitting: Internal Medicine

## 2015-02-28 NOTE — Assessment & Plan Note (Signed)
Will cont percocet as needed for pain

## 2015-02-28 NOTE — Assessment & Plan Note (Signed)
Will check her urine clx to identify the organism Will empirically treat with cipro

## 2015-02-28 NOTE — Assessment & Plan Note (Signed)
She has IBS-d and has suffered from this for years Will try viberzi

## 2015-03-01 LAB — CULTURE, URINE COMPREHENSIVE
Colony Count: NO GROWTH
Organism ID, Bacteria: NO GROWTH

## 2015-03-01 NOTE — Progress Notes (Signed)
Urine drug screen for this encounter is consistent for prescribed medication 

## 2015-03-02 ENCOUNTER — Encounter: Payer: Self-pay | Admitting: Internal Medicine

## 2015-03-03 DIAGNOSIS — M25561 Pain in right knee: Secondary | ICD-10-CM | POA: Diagnosis not present

## 2015-03-06 ENCOUNTER — Other Ambulatory Visit (HOSPITAL_COMMUNITY): Payer: Self-pay | Admitting: Orthopaedic Surgery

## 2015-03-07 ENCOUNTER — Telehealth: Payer: Self-pay | Admitting: Internal Medicine

## 2015-03-07 MED ORDER — PHENAZOPYRIDINE HCL 100 MG PO TABS: 100.0000 mg | ORAL_TABLET | Freq: Three times a day (TID) | ORAL | Status: DC | PRN

## 2015-03-07 MED ORDER — ALPRAZOLAM 1 MG PO TABS: 1.0000 mg | ORAL_TABLET | Freq: Three times a day (TID) | ORAL | Status: DC | PRN

## 2015-03-07 NOTE — Telephone Encounter (Signed)
Notified pt with md response.../lmb 

## 2015-03-07 NOTE — Telephone Encounter (Signed)
Her urine culture was negative so her symptoms are related to something else Will refill the xanax

## 2015-03-07 NOTE — Telephone Encounter (Signed)
Try pyridium

## 2015-03-07 NOTE — Telephone Encounter (Signed)
Notified pt with md response. Pt states she is burning when she urinates wanting to know what she can use for the burning...[name]

## 2015-03-07 NOTE — Telephone Encounter (Signed)
Patient was seen with a uti and she has taken all ciprofloxacin (CIPRO) 250 MG tablet and she is still having symptons.  Please advise patient. She is also almost out of ALPRAZolam Duanne Moron) 1 MG tablet [[id number]. Pharmacy is [facility] on [address].

## 2015-03-10 ENCOUNTER — Ambulatory Visit (HOSPITAL_COMMUNITY)
Admission: RE | Admit: 2015-03-10 | Discharge: 2015-03-10 | Disposition: A | Discharge status: Home / Self Care | Payer: Commercial Managed Care - HMO | Source: Ambulatory Visit | Attending: Orthopaedic Surgery | Admitting: Orthopaedic Surgery

## 2015-03-10 ENCOUNTER — Encounter (HOSPITAL_COMMUNITY)
Admission: RE | Admit: 2015-03-10 | Discharge: 2015-03-10 | Disposition: A | Discharge status: Home / Self Care | Payer: Commercial Managed Care - HMO | Source: Ambulatory Visit | Attending: Orthopaedic Surgery | Admitting: Orthopaedic Surgery

## 2015-03-10 ENCOUNTER — Encounter (HOSPITAL_COMMUNITY): Payer: Self-pay

## 2015-03-10 DIAGNOSIS — M179 Osteoarthritis of knee, unspecified: Secondary | ICD-10-CM | POA: Diagnosis not present

## 2015-03-10 DIAGNOSIS — I10 Essential (primary) hypertension: Secondary | ICD-10-CM | POA: Insufficient documentation

## 2015-03-10 DIAGNOSIS — Z01818 Encounter for other preprocedural examination: Secondary | ICD-10-CM | POA: Diagnosis not present

## 2015-03-10 DIAGNOSIS — M1710 Unilateral primary osteoarthritis, unspecified knee: Secondary | ICD-10-CM

## 2015-03-10 LAB — COMPREHENSIVE METABOLIC PANEL
ALT: 10 U/L (ref 0–35)
ANION GAP: 6 (ref 5–15)
AST: 15 U/L (ref 0–37)
Albumin: 3.5 g/dL (ref 3.5–5.2)
Alkaline Phosphatase: 51 U/L (ref 39–117)
BILIRUBIN TOTAL: 1.1 mg/dL (ref 0.3–1.2)
BUN: 11 mg/dL (ref 6–23)
CHLORIDE: 106 mmol/L (ref 96–112)
CO2: 27 mmol/L (ref 19–32)
Calcium: 9.4 mg/dL (ref 8.4–10.5)
Creatinine, Ser: 0.59 mg/dL (ref 0.50–1.10)
GFR, EST NON AFRICAN AMERICAN: 84 mL/min — AB (ref >90)
GLUCOSE: 90 mg/dL (ref 70–99)
POTASSIUM: 4.1 mmol/L (ref 3.5–5.1)
SODIUM: 139 mmol/L (ref 135–145)
Total Protein: 5.9 g/dL — ABNORMAL LOW (ref 6.0–8.3)

## 2015-03-10 LAB — CBC
HCT: 38.8 % (ref 36.0–46.0)
Hemoglobin: 12.1 g/dL (ref 12.0–15.0)
MCH: 29.7 pg (ref 26.0–34.0)
MCHC: 31.2 g/dL (ref 30.0–36.0)
MCV: 95.3 fL (ref 78.0–100.0)
PLATELETS: 146 10*3/uL — AB (ref 150–400)
RBC: 4.07 MIL/uL (ref 3.87–5.11)
RDW: 12.7 % (ref 11.5–15.5)
WBC: 6 10*3/uL (ref 4.0–10.5)

## 2015-03-10 LAB — PROTIME-INR
INR: 1.01 (ref 0.00–1.49)
Prothrombin Time: 13.5 seconds (ref 11.6–15.2)

## 2015-03-10 LAB — URINALYSIS, ROUTINE W REFLEX MICROSCOPIC
Glucose, UA: NEGATIVE mg/dL
HGB URINE DIPSTICK: NEGATIVE
KETONES UR: 15 mg/dL — AB
NITRITE: POSITIVE — AB
PH: 5 (ref 5.0–8.0)
Protein, ur: NEGATIVE mg/dL
SPECIFIC GRAVITY, URINE: 1.028 (ref 1.005–1.030)
Urobilinogen, UA: 1 mg/dL (ref 0.0–1.0)

## 2015-03-10 LAB — URINE MICROSCOPIC-ADD ON

## 2015-03-10 LAB — SURGICAL PCR SCREEN
MRSA, PCR: NEGATIVE
STAPHYLOCOCCUS AUREUS: NEGATIVE

## 2015-03-10 NOTE — Progress Notes (Addendum)
Anesthesia Chart Review:  Pt is 81 year old female scheduled for computer assisted R total knee arthroplasty on 03/22/2015 with Dr. Lorin Mercy.   PMH includes: HTN, DM, hyperlipidemia, OSA, CHF, hepatitis, anemia. Never smoker. BMI 37. S/p L shoulder arthroscopy 09/17/12.   Preoperative labs reviewed. UA positive for UTI. Urine culture added on and Cheryl at Dr. Lorin Mercy' office notified of result.   Chest x-ray.   EKG: NSR. Septal infarct, age undetermined. Appears unchanged when compared with previous tracings back to 10/26/2013.  Cardiac cath 11/14/2014 for abnormal stress test: -Left ventricular systolic function is normal, LVEF is estimated at 55-65%, there was significant MR but likely catheter induced -Final Conclusions: False positive myovue OK to proceed with orthopedic surgery. No significant CAD. Will check echo regarding MR to make sure it is catheter induced. She does not have a loud murmur.   Echo 10/28/2013: - Left ventricle: The cavity size was normal. Wall thickness was increased in a pattern of mild LVH. Systolic function was normal. The estimated ejection fraction was in the range of 60% to 65%. Wall motion was normal; there were no regional wall motion abnormalities. Doppler parameters are consistent with abnormal left ventricular relaxation(grade 1 diastolic dysfunction). Doppler parameters are consistent with elevated ventricular end-diastolic filling pressure. - Atrial septum: No defect or patent foramen ovale was identified.  Discussed case with Dr. Therisa Doyne.   If UTI treated and resolved, I anticipate pt can proceed with surgery as scheduled.   Willeen Cass, FNP-BC [facility]/Anesthesiology Phone: [phone] 03/13/2015 2:45 PM

## 2015-03-10 NOTE — Pre-Procedure Instructions (Signed)
[name]  03/10/2015   Your procedure is scheduled on:  Wednesday, April 27th   Report to [facility] Admitting at 10:30 AM.   Call this number if you have problems the morning of surgery: [phone]   Remember:   Do not eat food or drink liquids after midnight Tuesday.   Take these medicines the morning of surgery with A SIP OF WATER: Xanax, Oxycodone             Please STOP taking any anti-inflammatories like advil, aleve, ibuprofen, naproxen 4-5 days prior to surgery.   Do not wear jewelry, make-up or nail polish.  Do not wear lotions, powders, or perfumes. You may NOT wear deodorant the day of surgery.  Do not shave underarms & legs 48 hours prior to surgery.    Do not bring valuables to the hospital.  [facility] is not responsible  for any belongings or valuables.               Contacts, dentures or bridgework may not be worn into surgery.  Leave suitcase in the car. After surgery it may be brought to your room.  For patients admitted to the hospital, discharge time is determined by your treatment team.    Name and phone number of your driver:    Special Instructions: "Preparing for Surgery" instruction sheet.   Please read over the following fact sheets that you were given: Pain Booklet, Coughing and Deep Breathing, MRSA Information and Surgical Site Infection Prevention

## 2015-03-10 NOTE — Progress Notes (Addendum)
Went to [facility] 2 weeks ago for UTI....was on antibiotics and is now taking Phenazopyridine 100mg  but continues to have mild burning. Adding "culture" to UA per Dr. Lorin Mercy.   Was tested twice for OSA--first time she had to wear the mask & machine.  After second test, which was a yr ago, she no longer needs, nor uses the machine.

## 2015-03-11 LAB — URINE CULTURE: Colony Count: 35000

## 2015-03-13 ENCOUNTER — Telehealth: Payer: Self-pay | Admitting: Internal Medicine

## 2015-03-13 DIAGNOSIS — M159 Polyosteoarthritis, unspecified: Secondary | ICD-10-CM

## 2015-03-13 DIAGNOSIS — M150 Primary generalized (osteo)arthritis: Principal | ICD-10-CM

## 2015-03-13 DIAGNOSIS — M545 Low back pain: Secondary | ICD-10-CM

## 2015-03-13 MED ORDER — OXYCODONE-ACETAMINOPHEN 7.5-325 MG PO TABS: 1.0000 | ORAL_TABLET | ORAL | Status: DC | PRN

## 2015-03-13 NOTE — Telephone Encounter (Signed)
Left message advising pt that rx is at front office waiting to be picked up

## 2015-03-13 NOTE — Telephone Encounter (Signed)
done

## 2015-03-13 NOTE — Telephone Encounter (Signed)
Patient requesting a refill of   oxyCODONE-acetaminophen (PERCOCET) 7.5-325 MG per tablet [[id number]]

## 2015-03-16 ENCOUNTER — Ambulatory Visit (INDEPENDENT_AMBULATORY_CARE_PROVIDER_SITE_OTHER): Payer: Commercial Managed Care - HMO | Admitting: Internal Medicine

## 2015-03-16 ENCOUNTER — Telehealth: Payer: Self-pay | Admitting: Internal Medicine

## 2015-03-16 VITALS — BP 146/90 | HR 71 | Temp 98.2°F | Resp 16 | Ht 62.0 in | Wt 189.0 lb

## 2015-03-16 DIAGNOSIS — N3000 Acute cystitis without hematuria: Secondary | ICD-10-CM | POA: Diagnosis not present

## 2015-03-16 DIAGNOSIS — Z79899 Other long term (current) drug therapy: Secondary | ICD-10-CM | POA: Diagnosis not present

## 2015-03-16 DIAGNOSIS — K5909 Other constipation: Secondary | ICD-10-CM | POA: Diagnosis not present

## 2015-03-16 DIAGNOSIS — N760 Acute vaginitis: Secondary | ICD-10-CM

## 2015-03-16 DIAGNOSIS — N952 Postmenopausal atrophic vaginitis: Secondary | ICD-10-CM

## 2015-03-16 DIAGNOSIS — I10 Essential (primary) hypertension: Secondary | ICD-10-CM

## 2015-03-16 DIAGNOSIS — Z79891 Long term (current) use of opiate analgesic: Secondary | ICD-10-CM | POA: Diagnosis not present

## 2015-03-16 MED ORDER — LUBIPROSTONE 24 MCG PO CAPS: 24.0000 ug | ORAL_CAPSULE | Freq: Two times a day (BID) | ORAL | Status: DC

## 2015-03-16 MED ORDER — ESTROGENS, CONJUGATED 0.625 MG/GM VA CREA: 1.0000 | TOPICAL_CREAM | Freq: Every day | VAGINAL | Status: DC

## 2015-03-16 NOTE — Patient Instructions (Signed)

## 2015-03-16 NOTE — Progress Notes (Signed)
Pre visit review using our clinic review tool, if applicable. No additional management support is needed unless otherwise documented below in the visit note. 

## 2015-03-17 ENCOUNTER — Encounter: Payer: Self-pay | Admitting: Internal Medicine

## 2015-03-17 NOTE — Telephone Encounter (Signed)
Patient states she seen Dr. Ronnald Ramp yesterday and he was going to give her samples of something for constipation.  He never did and she is requesting samples.  I did inform patient it might be Monday before she hears back since Dr. Ronnald Ramp is out of the office.

## 2015-03-18 NOTE — Telephone Encounter (Signed)
amitiza

## 2015-03-20 ENCOUNTER — Encounter
Payer: Commercial Managed Care - HMO | Attending: Physical Medicine & Rehabilitation | Admitting: Physical Medicine & Rehabilitation

## 2015-03-20 ENCOUNTER — Telehealth: Payer: Self-pay

## 2015-03-20 ENCOUNTER — Encounter: Payer: Self-pay | Admitting: Physical Medicine & Rehabilitation

## 2015-03-20 ENCOUNTER — Encounter: Payer: Self-pay | Admitting: Internal Medicine

## 2015-03-20 ENCOUNTER — Ambulatory Visit: Payer: Commercial Managed Care - HMO | Admitting: Internal Medicine

## 2015-03-20 VITALS — BP 162/87 | HR 71 | Resp 14

## 2015-03-20 DIAGNOSIS — M4726 Other spondylosis with radiculopathy, lumbar region: Secondary | ICD-10-CM | POA: Diagnosis not present

## 2015-03-20 DIAGNOSIS — M5440 Lumbago with sciatica, unspecified side: Secondary | ICD-10-CM | POA: Insufficient documentation

## 2015-03-20 DIAGNOSIS — M47816 Spondylosis without myelopathy or radiculopathy, lumbar region: Secondary | ICD-10-CM | POA: Insufficient documentation

## 2015-03-20 DIAGNOSIS — Z5181 Encounter for therapeutic drug level monitoring: Secondary | ICD-10-CM | POA: Insufficient documentation

## 2015-03-20 DIAGNOSIS — M5416 Radiculopathy, lumbar region: Secondary | ICD-10-CM | POA: Diagnosis not present

## 2015-03-20 DIAGNOSIS — Z79899 Other long term (current) drug therapy: Secondary | ICD-10-CM | POA: Diagnosis not present

## 2015-03-20 DIAGNOSIS — M1711 Unilateral primary osteoarthritis, right knee: Secondary | ICD-10-CM | POA: Insufficient documentation

## 2015-03-20 MED ORDER — GABAPENTIN 300 MG PO CAPS: 600.0000 mg | ORAL_CAPSULE | Freq: Every day | ORAL | Status: DC

## 2015-03-20 NOTE — Assessment & Plan Note (Signed)
Her BP is adequately well controlled 

## 2015-03-20 NOTE — Telephone Encounter (Signed)
Per MD need to check status of wet prep that was done on 03/16/15, no results in the computer. Called [facility] spoke with Cassie  Who advised that there was nothing processed or written in the sign in book. I advised that specimen was walked down and handed to [facility]. Per Cassie, she will check with Blanch Media or call her as she is not in the office today.

## 2015-03-20 NOTE — Assessment & Plan Note (Signed)
Will start estrogen vaginal cream to treat this

## 2015-03-20 NOTE — Patient Instructions (Signed)
AFTER YOU SURGERY, WE WILL SEE YOU BACK AN DECIDE WHAT TYPES OF MEDICATIONS AND TREATMENT YOU NEED. DR. Lorin Mercy MAY WRITE YOUR PAIN MEDICATIONS AT [place] AND UNTIL YOU COME BACK TO SEE ME IN ABOUT 5 WEEKS.

## 2015-03-20 NOTE — Progress Notes (Signed)
Subjective:    Patient ID: [name], female    DOB: [birth year], 81 y.o.   MRN: [id number]  HPI  [name] is here in follow up of her knee and back pain. She has been "hanging" on---as things haven't changed a great deal.   She was out with her family this weekend and walked a bit more than she typically does, and the extra ambulation irritated her right knee.   At last visit we started her back on the gabapentin which helped her sleep a little longer. She isn't always using it during the day as it sometimes makes her sleepy.   She is using percocet for breakthrough pain during the day which helps take the "edge" off.  She will occasionally use ice on her knee.   [name] has right knee replacement scheduled for 4/27 at [facility] per Dr. Lorin Mercy.   Pain Inventory Average Pain 8 Pain Right Now 8 My pain is constant, sharp, dull and stabbing  In the last 24 hours, has pain interfered with the following? General activity 8 Relation with others 6 Enjoyment of life 10 What TIME of day is your pain at its worst? morning Sleep (in general) Poor  Pain is worse with: walking, bending, sitting and standing Pain improves with: medication Relief from Meds: 4  Mobility walk with assistance use a cane how many minutes can you walk? 5 ability to climb steps?  yes do you drive?  yes  Function disabled: date disabled . Do you have any goals in this area?  yes  Neuro/Psych bladder control problems weakness numbness anxiety  Prior Studies Any changes since last visit?  no  Physicians involved in your care Any changes since last visit?  no   Family History  Problem Relation Age of Onset  . Alcohol abuse Other   . Hypertension Other   . Kidney disease Other   . Mental illness Other   . Cancer Father     prostate cancer  . Heart disease Sister   . Heart disease Brother   . Emphysema Brother    History   Social History  . Marital Status: Widowed    Spouse Name: N/A   . Number of Children: 5  . Years of Education: N/A   Occupational History  . retired      Museum/gallery curator at [employer]  . Smoking status: Never Smoker   . Smokeless tobacco: Never Used  . Alcohol Use: No  . Drug Use: No  . Sexual Activity: Not Currently   Other Topics Concern  . None   Social History Narrative   Past Surgical History  Procedure Laterality Date  . Appendectomy    . Cholecystectomy    . Abdominal hysterectomy    . Tonsillectomy    . Revision total knee arthroplasty  2011  . Rotator cuff repair  2010  . Tonsillectomy    . Joint replacement      LEFT KNEE   . Knee arthroscopy      RIGHT  . Shoulder arthroscopy  09/17/2012    Procedure: ARTHROSCOPY SHOULDER;  Surgeon: Sharmon Revere, MD;  Location: [facility];  Service: Orthopedics;  Laterality: Left;  . Left and right heart catheterization with coronary angiogram N/A 10/28/2013    Procedure: LEFT AND RIGHT HEART CATHETERIZATION WITH CORONARY ANGIOGRAM;  Surgeon: Blane Ohara, MD;  Location: [facility] CATH LAB;  Service: Cardiovascular;  Laterality: N/A;  . Left heart  catheterization with coronary angiogram N/A 11/14/2014    Procedure: LEFT HEART CATHETERIZATION WITH CORONARY ANGIOGRAM;  Surgeon: Josue Hector, MD;  Location: [facility] CATH LAB;  Service: Cardiovascular;  Laterality: N/A;   Past Medical History  Diagnosis Date  . Hyperlipidemia   . Hypertension   . Osteoarthritis   . Wears dentures   . Bruises easily   . Diarrhea   . Abdominal pain   . Wears glasses   . Shortness of breath     WITH EXERTION USES 2 L O2 BEDTIME  . Oxygen desaturation during sleep     USES 2 LITERS BEDTIME   VIA CPAP 06/2012 [facility]   . Anxiety   . Depression   . Sleep apnea     CPAP WITH O2 2 LITERS 2013 ([facility])  . Diabetes mellitus     type II  DIET CONTROLLED  . Chronic headaches     HISTORY OF   . Anemia     NOS  . Hepatitis     AGE 30S  . Chronic diastolic CHF (congestive heart failure)      a. 06/2013 EF 65-70%.  . Mild aortic stenosis     a. 06/2013 Echo: EF 65-70%, mod LVH with focal basal hypertrophy, very mild AS.   BP 162/87 mmHg  Pulse 71  Resp 14  SpO2 96%  Opioid Risk Score:   Fall Risk Score:  `1  Depression screen PHQ 2/9  Depression screen [facility] 2/9 03/20/2015 02/10/2015 04/18/2013 04/16/2013 04/05/2013  Decreased Interest 2 3 0 0 0  Down, Depressed, Hopeless 1 3 0 0 0  PHQ - 2 Score 3 6 0 0 0  Altered sleeping 1 3 - - -  Tired, decreased energy 3 3 - - -  Change in appetite 1 0 - - -  Feeling bad or failure about yourself  0 1 - - -  Trouble concentrating 3 1 - - -  Moving slowly or fidgety/restless 3 1 - - -  Suicidal thoughts 0 0 - - -  PHQ-9 Score 14 15 - - -     Review of Systems  Gastrointestinal: Positive for abdominal pain. Negative for constipation.  Musculoskeletal: Positive for gait problem.  Neurological: Positive for weakness and numbness.  Psychiatric/Behavioral: The patient is nervous/anxious.   All other systems reviewed and are negative.      Objective:   Physical Exam  General: Alert and oriented x 3, No apparent distress. obese  HEENT: Head is normocephalic, atraumatic, PERRLA, EOMI, sclera anicteric, oral mucosa pink and moist, dentition intact, ext ear canals clear,  Neck: Supple without JVD or lymphadenopathy  Heart: Reg rate and rhythm. No murmurs rubs or gallops  Chest: CTA bilaterally without wheezes, rales, or rhonchi; no distress  Abdomen: Soft, non-tender, non-distended, bowel sounds positive.  Extremities: No clubbing, cyanosis, or edema. Pulses are 2+  Skin: Clean and intact without signs of breakdown  Neuro: Pt is cognitively appropriate with normal insight, memory, and awareness. Cranial nerves 2-12 are intact. Sensory exam is normal except for diminished LT in both feet. . Reflexes are 1+ in all 4's. Fine motor coordination is intact. No tremors. Motor function is grossly 5/5 in the upper ext with some limitations at  the left shoulder. LES grossly 3+HF, RKE 3-, LKE 4 ankles 4/5. Marland Kitchen  Musculoskeletal: Right knee with persistent crepitus and pain with AROM and PROM. Substantial antalgia noted as well when standing. She tends to lean to the left to off  load the leg. Medial joint line pain and effusion was also seen. Lumbar spine was tender to palpation. She was rotated left and bending to left at baseline. She could not flex, lat bend or extend past about 20 degrees. She used her right arm to support herself on the chair when she attempted flexion. SST was positive.  Psych: Pt's affect is appropriate. Pt is cooperative   Assessment & Plan:   1. Chronic low back pain, lumbar spondylosis  2. Endstage OA right knee  3. Hx of left TKA  4. Left RTC disease.  5. Morbid obesity    Plan:  1. Will try gabapentin 600mg  at bed time for radicular pain and sleep.  She was advised to be observant for AM sedation or SE with the increased dosing.  2. Given the proximity of her surgery, I won't add any new narcotics or increase her current dose. Dr. Lorin Mercy may write her narcotics at hospital and until follow up with me. At her follow up in about 5 weeks we should be able to determine appropriate long term options. I'm not opposed to iniating a long acting opiate such as ms contin for pain, but my hope is that her pain levels will decrease ultimately post-op 3. Utilize ice,heat  4. ?Right TKA per orthopedics on Wednesday.  5.  Fifteen minutes of face to face patient care time were spent during this visit. All questions were encouraged and answered. Follow up with me or NP in about 5 weeks.

## 2015-03-20 NOTE — Progress Notes (Signed)
Subjective:    Patient ID: [name], female    DOB: 1933/01/27, 81 y.o.   MRN: [id number]  HPI Comments: She complains of persistent dysuria - "it stings down there when I pee"      Review of Systems  Constitutional: Negative for fever, chills, diaphoresis, appetite change and fatigue.  HENT: Negative.   Eyes: Negative.   Respiratory: Negative.  Negative for cough, choking, chest tightness, shortness of breath and stridor.   Cardiovascular: Negative.  Negative for chest pain, palpitations and leg swelling.  Gastrointestinal: Negative.  Negative for nausea, vomiting, abdominal pain, diarrhea, constipation and blood in stool.  Endocrine: Negative.   Genitourinary: Positive for dysuria and vaginal pain. Negative for urgency, frequency, hematuria, flank pain, decreased urine volume, vaginal bleeding, vaginal discharge, enuresis, difficulty urinating, genital sores, menstrual problem, pelvic pain and dyspareunia.  Musculoskeletal: Negative.   Skin: Negative.  Negative for rash.  Allergic/Immunologic: Negative.   Neurological: Negative.   Hematological: Negative.  Negative for adenopathy. Does not bruise/bleed easily.  Psychiatric/Behavioral: Negative.        Objective:   Physical Exam  Constitutional: She is oriented to person, place, and time. She appears well-developed and well-nourished. No distress.  HENT:  Head: Normocephalic and atraumatic.  Mouth/Throat: Oropharynx is clear and moist. No oropharyngeal exudate.  Eyes: Conjunctivae are normal. Right eye exhibits no discharge. Left eye exhibits no discharge. No scleral icterus.  Neck: Normal range of motion. Neck supple. No JVD present. No tracheal deviation present. No thyromegaly present.  Cardiovascular: Normal rate, regular rhythm, normal heart sounds and intact distal pulses.  Exam reveals no gallop and no friction rub.   No murmur heard. Pulmonary/Chest: Effort normal and breath sounds normal. No stridor. No  respiratory distress. She has no wheezes. She has no rales. She exhibits no tenderness.  Abdominal: Soft. Bowel sounds are normal. She exhibits no distension and no mass. There is no tenderness. There is no rebound and no guarding. Hernia confirmed negative in the right inguinal area and confirmed negative in the left inguinal area.  Genitourinary: No breast swelling, tenderness, discharge or bleeding. Pelvic exam was performed with patient supine. No labial fusion. There is injury on the right labia. There is no rash, tenderness or lesion on the right labia. There is injury on the left labia. There is no rash, tenderness or lesion on the left labia. No erythema, tenderness or bleeding in the vagina. No foreign body around the vagina. No signs of injury around the vagina. No vaginal discharge found.  There is severe atrophy and diffuse erosion of the vaginal introitus, the labia are atrophic but there are no lesions, tenderness, or swelling. There is no significant discharge in the vaginal vault.  Musculoskeletal: Normal range of motion. She exhibits no edema or tenderness.  Lymphadenopathy:    She has no cervical adenopathy.       Right: No inguinal adenopathy present.       Left: No inguinal adenopathy present.  Neurological: She is oriented to person, place, and time.  Skin: Skin is warm and dry. No rash noted. She is not diaphoretic. No erythema. No pallor.  Psychiatric: She has a normal mood and affect. Her behavior is normal. Judgment and thought content normal.  Vitals reviewed.   Lab Results  Component Value Date   WBC 6.0 03/10/2015   HGB 12.1 03/10/2015   HCT 38.8 03/10/2015   PLT 146* 03/10/2015   GLUCOSE 90 03/10/2015   CHOL 209* 05/25/2014  TRIG 137.0 05/25/2014   HDL 65.60 05/25/2014   LDLDIRECT 150.1 04/15/2011   LDLCALC 116* 05/25/2014   ALT 10 03/10/2015   AST 15 03/10/2015   NA 139 03/10/2015   K 4.1 03/10/2015   CL 106 03/10/2015   CREATININE 0.59 03/10/2015   BUN  11 03/10/2015   CO2 27 03/10/2015   TSH 4.03 11/04/2014   INR 1.01 03/10/2015   HGBA1C 5.5 02/21/2014        Assessment & Plan:

## 2015-03-20 NOTE — Telephone Encounter (Signed)
Notified pt with md response rx has been sent to pharmacy...[name]

## 2015-03-20 NOTE — Assessment & Plan Note (Signed)
She has been treated for a UTI but still has dysuria Will check a wet prep for infection as this may be BV

## 2015-03-21 ENCOUNTER — Telehealth: Payer: Self-pay | Admitting: Internal Medicine

## 2015-03-21 ENCOUNTER — Telehealth: Payer: Self-pay | Admitting: Cardiovascular Disease

## 2015-03-21 MED ORDER — CEFAZOLIN SODIUM-DEXTROSE 2-3 GM-% IV SOLR
2.0000 g | INTRAVENOUS | Status: AC
  Administered 2015-03-22: 2 g via INTRAVENOUS
  Filled 2015-03-21: qty 50

## 2015-03-21 MED ORDER — CHLORHEXIDINE GLUCONATE 4 % EX LIQD
60.0000 mL | Freq: Once | CUTANEOUS | Status: DC
  Filled 2015-03-21: qty 60

## 2015-03-21 NOTE — Telephone Encounter (Signed)
Lynette in the lab notified that this specimen as well as another specimen were both dropped off [place] and placed in the hands of Blanch Media who advised that she would take care of everything from that point. Per Willette Cluster, no one has been able to track down specimen and assumes that specimen was discarded. Incident reported to clinical team lead who also spoke with "[facility] lab), as a lab error and at this point.

## 2015-03-21 NOTE — Telephone Encounter (Signed)
New Message       Calling stating that they faxed a surgical clearance form for pt last week and haven't heard anything back from our office. Pt's surgery is scheduled for tomorrow, please call back and advise.

## 2015-03-21 NOTE — Telephone Encounter (Signed)
Pt called in and said that she is having surgery in the morning and wanted to know if you had her Surgical clearance form and could be faxed over today?

## 2015-03-21 NOTE — Telephone Encounter (Signed)
[facility]  NOTIFIED FORM FAXED  PT  CLEARED  FROM CARDIAC  STANDPOINT TO PROCEED WITH  SURGERY .Adonis Housekeeper

## 2015-03-22 ENCOUNTER — Inpatient Hospital Stay (HOSPITAL_COMMUNITY): Payer: Commercial Managed Care - HMO | Admitting: Vascular Surgery

## 2015-03-22 ENCOUNTER — Inpatient Hospital Stay (HOSPITAL_COMMUNITY): Payer: Commercial Managed Care - HMO | Admitting: Certified Registered Nurse Anesthetist

## 2015-03-22 ENCOUNTER — Inpatient Hospital Stay (HOSPITAL_COMMUNITY): Payer: Commercial Managed Care - HMO

## 2015-03-22 ENCOUNTER — Encounter (HOSPITAL_COMMUNITY)
Admission: RE | Disposition: A | Discharge status: Home Health | Payer: Self-pay | Source: Ambulatory Visit | Attending: Orthopaedic Surgery

## 2015-03-22 ENCOUNTER — Encounter (HOSPITAL_COMMUNITY): Payer: Self-pay | Admitting: *Deleted

## 2015-03-22 ENCOUNTER — Inpatient Hospital Stay (HOSPITAL_COMMUNITY)
Admission: RE | Admit: 2015-03-22 | Discharge: 2015-03-25 | DRG: 470 | Disposition: A | Discharge status: Home Health | Payer: Commercial Managed Care - HMO | Source: Ambulatory Visit | Attending: Orthopaedic Surgery | Admitting: Orthopaedic Surgery

## 2015-03-22 DIAGNOSIS — Z96652 Presence of left artificial knee joint: Secondary | ICD-10-CM | POA: Diagnosis present

## 2015-03-22 DIAGNOSIS — Z6834 Body mass index (BMI) 34.0-34.9, adult: Secondary | ICD-10-CM

## 2015-03-22 DIAGNOSIS — G8918 Other acute postprocedural pain: Secondary | ICD-10-CM | POA: Diagnosis not present

## 2015-03-22 DIAGNOSIS — E785 Hyperlipidemia, unspecified: Secondary | ICD-10-CM | POA: Diagnosis present

## 2015-03-22 DIAGNOSIS — G4733 Obstructive sleep apnea (adult) (pediatric): Secondary | ICD-10-CM | POA: Diagnosis present

## 2015-03-22 DIAGNOSIS — M25561 Pain in right knee: Secondary | ICD-10-CM | POA: Diagnosis present

## 2015-03-22 DIAGNOSIS — F419 Anxiety disorder, unspecified: Secondary | ICD-10-CM | POA: Diagnosis not present

## 2015-03-22 DIAGNOSIS — M179 Osteoarthritis of knee, unspecified: Principal | ICD-10-CM | POA: Diagnosis present

## 2015-03-22 DIAGNOSIS — I10 Essential (primary) hypertension: Secondary | ICD-10-CM | POA: Diagnosis not present

## 2015-03-22 DIAGNOSIS — E119 Type 2 diabetes mellitus without complications: Secondary | ICD-10-CM | POA: Diagnosis present

## 2015-03-22 DIAGNOSIS — F329 Major depressive disorder, single episode, unspecified: Secondary | ICD-10-CM | POA: Diagnosis present

## 2015-03-22 DIAGNOSIS — I5032 Chronic diastolic (congestive) heart failure: Secondary | ICD-10-CM | POA: Diagnosis not present

## 2015-03-22 DIAGNOSIS — Z7982 Long term (current) use of aspirin: Secondary | ICD-10-CM

## 2015-03-22 DIAGNOSIS — K589 Irritable bowel syndrome without diarrhea: Secondary | ICD-10-CM | POA: Diagnosis present

## 2015-03-22 DIAGNOSIS — G709 Myoneural disorder, unspecified: Secondary | ICD-10-CM | POA: Diagnosis present

## 2015-03-22 DIAGNOSIS — Z96651 Presence of right artificial knee joint: Secondary | ICD-10-CM | POA: Diagnosis not present

## 2015-03-22 DIAGNOSIS — M1711 Unilateral primary osteoarthritis, right knee: Secondary | ICD-10-CM | POA: Diagnosis not present

## 2015-03-22 DIAGNOSIS — Z471 Aftercare following joint replacement surgery: Secondary | ICD-10-CM | POA: Diagnosis not present

## 2015-03-22 DIAGNOSIS — Z96659 Presence of unspecified artificial knee joint: Secondary | ICD-10-CM

## 2015-03-22 HISTORY — PX: KNEE ARTHROPLASTY: SHX992

## 2015-03-22 SURGERY — ARTHROPLASTY, KNEE, TOTAL, USING IMAGELESS COMPUTER-ASSISTED NAVIGATION
Anesthesia: Monitor Anesthesia Care | Laterality: Right

## 2015-03-22 MED ORDER — BUPIVACAINE IN DEXTROSE 0.75-8.25 % IT SOLN
INTRATHECAL | Status: DC | PRN
  Administered 2015-03-22: 1.8 mL via INTRATHECAL

## 2015-03-22 MED ORDER — SODIUM CHLORIDE 0.9 % IR SOLN
Status: DC | PRN
  Administered 2015-03-22: 1000 mL

## 2015-03-22 MED ORDER — BUPIVACAINE HCL (PF) 0.25 % IJ SOLN
INTRAMUSCULAR | Status: AC
  Filled 2015-03-22: qty 30

## 2015-03-22 MED ORDER — FENTANYL CITRATE (PF) 100 MCG/2ML IJ SOLN
INTRAMUSCULAR | Status: DC | PRN
  Administered 2015-03-22: 50 ug via INTRAVENOUS

## 2015-03-22 MED ORDER — HYDROMORPHONE HCL 1 MG/ML IJ SOLN
INTRAMUSCULAR | Status: AC
  Filled 2015-03-22: qty 1

## 2015-03-22 MED ORDER — BUPIVACAINE HCL (PF) 0.25 % IJ SOLN
INTRAMUSCULAR | Status: DC | PRN
  Administered 2015-03-22: 10 mL

## 2015-03-22 MED ORDER — POTASSIUM CHLORIDE IN NACL 20-0.45 MEQ/L-% IV SOLN
INTRAVENOUS | Status: DC
  Administered 2015-03-22: 20:00:00 via INTRAVENOUS
  Filled 2015-03-22 (×8): qty 1000

## 2015-03-22 MED ORDER — SUCCINYLCHOLINE CHLORIDE 20 MG/ML IJ SOLN
INTRAMUSCULAR | Status: AC
  Filled 2015-03-22: qty 1

## 2015-03-22 MED ORDER — ACETAMINOPHEN 650 MG RE SUPP: 650.0000 mg | Freq: Four times a day (QID) | RECTAL | Status: DC | PRN

## 2015-03-22 MED ORDER — GABAPENTIN 300 MG PO CAPS
600.0000 mg | ORAL_CAPSULE | Freq: Every day | ORAL | Status: DC
  Administered 2015-03-22 – 2015-03-24 (×3): 600 mg via ORAL
  Filled 2015-03-22 (×3): qty 2

## 2015-03-22 MED ORDER — ONDANSETRON HCL 4 MG/2ML IJ SOLN
INTRAMUSCULAR | Status: DC | PRN
  Administered 2015-03-22: 4 mg via INTRAVENOUS

## 2015-03-22 MED ORDER — OXYCODONE HCL 5 MG PO TABS
5.0000 mg | ORAL_TABLET | ORAL | Status: DC | PRN
  Administered 2015-03-22 – 2015-03-23 (×4): 10 mg via ORAL
  Filled 2015-03-22 (×3): qty 2

## 2015-03-22 MED ORDER — HYDROMORPHONE HCL 1 MG/ML IJ SOLN
0.2500 mg | INTRAMUSCULAR | Status: DC | PRN
  Administered 2015-03-22 (×3): 0.5 mg via INTRAVENOUS

## 2015-03-22 MED ORDER — MECLIZINE HCL 12.5 MG PO TABS: 25.0000 mg | ORAL_TABLET | Freq: Three times a day (TID) | ORAL | Status: DC | PRN

## 2015-03-22 MED ORDER — METHOCARBAMOL 500 MG PO TABS
ORAL_TABLET | ORAL | Status: AC
  Filled 2015-03-22: qty 1

## 2015-03-22 MED ORDER — SENNOSIDES-DOCUSATE SODIUM 8.6-50 MG PO TABS: 1.0000 | ORAL_TABLET | Freq: Every evening | ORAL | Status: DC | PRN

## 2015-03-22 MED ORDER — ACETAMINOPHEN 325 MG PO TABS: 650.0000 mg | ORAL_TABLET | Freq: Four times a day (QID) | ORAL | Status: DC | PRN

## 2015-03-22 MED ORDER — METOCLOPRAMIDE HCL 5 MG PO TABS
5.0000 mg | ORAL_TABLET | Freq: Three times a day (TID) | ORAL | Status: DC | PRN
  Administered 2015-03-23: 10 mg via ORAL
  Filled 2015-03-22: qty 2

## 2015-03-22 MED ORDER — SODIUM CHLORIDE 0.9 % IV SOLN
10.0000 mg | INTRAVENOUS | Status: DC | PRN
  Administered 2015-03-22: 10 ug/min via INTRAVENOUS

## 2015-03-22 MED ORDER — LOSARTAN POTASSIUM 50 MG PO TABS
100.0000 mg | ORAL_TABLET | Freq: Every day | ORAL | Status: DC
  Administered 2015-03-22 – 2015-03-24 (×3): 100 mg via ORAL
  Filled 2015-03-22 (×4): qty 2

## 2015-03-22 MED ORDER — DOCUSATE SODIUM 100 MG PO CAPS
100.0000 mg | ORAL_CAPSULE | Freq: Two times a day (BID) | ORAL | Status: DC
  Administered 2015-03-22 – 2015-03-24 (×4): 100 mg via ORAL
  Filled 2015-03-22 (×5): qty 1

## 2015-03-22 MED ORDER — TRAZODONE HCL 100 MG PO TABS
200.0000 mg | ORAL_TABLET | Freq: Every day | ORAL | Status: DC
  Administered 2015-03-22 – 2015-03-24 (×3): 200 mg via ORAL
  Filled 2015-03-22 (×3): qty 2

## 2015-03-22 MED ORDER — PHENOL 1.4 % MT LIQD: 1.0000 | OROMUCOSAL | Status: DC | PRN

## 2015-03-22 MED ORDER — OXYCODONE HCL 5 MG PO TABS
ORAL_TABLET | ORAL | Status: AC
  Filled 2015-03-22: qty 3

## 2015-03-22 MED ORDER — ASPIRIN EC 325 MG PO TBEC
325.0000 mg | DELAYED_RELEASE_TABLET | Freq: Every day | ORAL | Status: DC
  Administered 2015-03-23 – 2015-03-25 (×3): 325 mg via ORAL
  Filled 2015-03-22 (×4): qty 1

## 2015-03-22 MED ORDER — FENTANYL CITRATE (PF) 250 MCG/5ML IJ SOLN
INTRAMUSCULAR | Status: AC
  Filled 2015-03-22: qty 5

## 2015-03-22 MED ORDER — HYDROMORPHONE HCL 1 MG/ML IJ SOLN
0.5000 mg | INTRAMUSCULAR | Status: DC | PRN
  Administered 2015-03-22 – 2015-03-23 (×4): 0.5 mg via INTRAVENOUS
  Filled 2015-03-22 (×4): qty 1

## 2015-03-22 MED ORDER — ISOSORB DINITRATE-HYDRALAZINE 20-37.5 MG PO TABS
0.5000 | ORAL_TABLET | Freq: Two times a day (BID) | ORAL | Status: DC
  Administered 2015-03-22 – 2015-03-25 (×5): 0.5 via ORAL
  Filled 2015-03-22 (×8): qty 0.5

## 2015-03-22 MED ORDER — MIDAZOLAM HCL 2 MG/2ML IJ SOLN
INTRAMUSCULAR | Status: AC
  Filled 2015-03-22: qty 2

## 2015-03-22 MED ORDER — OXYCODONE HCL 5 MG/5ML PO SOLN: 5.0000 mg | Freq: Once | ORAL | Status: AC | PRN

## 2015-03-22 MED ORDER — ELUXADOLINE 75 MG PO TABS
1.0000 | ORAL_TABLET | Freq: Two times a day (BID) | ORAL | Status: DC
  Filled 2015-03-22: qty 75

## 2015-03-22 MED ORDER — LIDOCAINE HCL (CARDIAC) 20 MG/ML IV SOLN
INTRAVENOUS | Status: AC
  Filled 2015-03-22: qty 5

## 2015-03-22 MED ORDER — LACTATED RINGERS IV SOLN
INTRAVENOUS | Status: DC
  Administered 2015-03-22 (×2): via INTRAVENOUS

## 2015-03-22 MED ORDER — ALPRAZOLAM 0.5 MG PO TABS
1.0000 mg | ORAL_TABLET | Freq: Three times a day (TID) | ORAL | Status: DC | PRN
Start: 2015-03-22 — End: 2015-03-23
  Administered 2015-03-22: 1 mg via ORAL
  Filled 2015-03-22: qty 2

## 2015-03-22 MED ORDER — ONDANSETRON HCL 4 MG/2ML IJ SOLN
INTRAMUSCULAR | Status: AC
  Filled 2015-03-22: qty 4

## 2015-03-22 MED ORDER — PROPOFOL 10 MG/ML IV BOLUS
INTRAVENOUS | Status: AC
  Filled 2015-03-22: qty 20

## 2015-03-22 MED ORDER — BUPIVACAINE-EPINEPHRINE (PF) 0.5% -1:200000 IJ SOLN
INTRAMUSCULAR | Status: DC | PRN
  Administered 2015-03-22: 18 mL via PERINEURAL

## 2015-03-22 MED ORDER — LUBIPROSTONE 24 MCG PO CAPS
24.0000 ug | ORAL_CAPSULE | Freq: Two times a day (BID) | ORAL | Status: DC
  Administered 2015-03-23 – 2015-03-24 (×3): 24 ug via ORAL
  Filled 2015-03-22 (×5): qty 1

## 2015-03-22 MED ORDER — FENTANYL CITRATE (PF) 100 MCG/2ML IJ SOLN
INTRAMUSCULAR | Status: DC
Start: 2015-03-22 — End: 2015-03-22
  Filled 2015-03-22: qty 2

## 2015-03-22 MED ORDER — PRAVASTATIN SODIUM 40 MG PO TABS
80.0000 mg | ORAL_TABLET | Freq: Every evening | ORAL | Status: DC
  Administered 2015-03-22 – 2015-03-24 (×3): 80 mg via ORAL
  Filled 2015-03-22 (×3): qty 2

## 2015-03-22 MED ORDER — LIDOCAINE HCL (CARDIAC) 20 MG/ML IV SOLN
INTRAVENOUS | Status: DC | PRN
  Administered 2015-03-22: 80 mg via INTRAVENOUS

## 2015-03-22 MED ORDER — DEXTROSE 5 % IV SOLN
500.0000 mg | Freq: Four times a day (QID) | INTRAVENOUS | Status: DC | PRN
  Filled 2015-03-22: qty 5

## 2015-03-22 MED ORDER — FENTANYL CITRATE (PF) 100 MCG/2ML IJ SOLN: 100.0000 ug | Freq: Once | INTRAMUSCULAR | Status: DC

## 2015-03-22 MED ORDER — METHOCARBAMOL 500 MG PO TABS
500.0000 mg | ORAL_TABLET | Freq: Four times a day (QID) | ORAL | Status: DC | PRN
  Administered 2015-03-22 – 2015-03-23 (×2): 500 mg via ORAL
  Filled 2015-03-22 (×2): qty 1

## 2015-03-22 MED ORDER — PROPOFOL 10 MG/ML IV BOLUS
INTRAVENOUS | Status: DC | PRN
  Administered 2015-03-22: 30 mg via INTRAVENOUS

## 2015-03-22 MED ORDER — METOCLOPRAMIDE HCL 5 MG/ML IJ SOLN: 5.0000 mg | Freq: Three times a day (TID) | INTRAMUSCULAR | Status: DC | PRN

## 2015-03-22 MED ORDER — MIDAZOLAM HCL 2 MG/2ML IJ SOLN: 1.0000 mg | INTRAMUSCULAR | Status: DC | PRN

## 2015-03-22 MED ORDER — ONDANSETRON HCL 4 MG PO TABS: 4.0000 mg | ORAL_TABLET | Freq: Four times a day (QID) | ORAL | Status: DC | PRN

## 2015-03-22 MED ORDER — ONDANSETRON HCL 4 MG/2ML IJ SOLN
4.0000 mg | Freq: Four times a day (QID) | INTRAMUSCULAR | Status: DC | PRN
  Administered 2015-03-22: 4 mg via INTRAVENOUS
  Filled 2015-03-22: qty 2

## 2015-03-22 MED ORDER — MENTHOL 3 MG MT LOZG: 1.0000 | LOZENGE | OROMUCOSAL | Status: DC | PRN

## 2015-03-22 MED ORDER — PROPOFOL INFUSION 10 MG/ML OPTIME
INTRAVENOUS | Status: DC | PRN
  Administered 2015-03-22: 75 ug/kg/min via INTRAVENOUS

## 2015-03-22 MED ORDER — OXYCODONE HCL 5 MG PO TABS
5.0000 mg | ORAL_TABLET | Freq: Once | ORAL | Status: AC | PRN
  Administered 2015-03-22: 5 mg via ORAL

## 2015-03-22 SURGICAL SUPPLY — 64 items
BANDAGE ELASTIC 4 VELCRO ST LF (GAUZE/BANDAGES/DRESSINGS) ×3 IMPLANT
BANDAGE ELASTIC 6 VELCRO ST LF (GAUZE/BANDAGES/DRESSINGS) ×3 IMPLANT
BANDAGE ESMARK 6X9 LF (GAUZE/BANDAGES/DRESSINGS) ×1 IMPLANT
BLADE SAGITTAL 25.0X1.19X90 (BLADE) ×2 IMPLANT
BLADE SAGITTAL 25.0X1.19X90MM (BLADE) ×1
BLADE SAW SGTL 13X75X1.27 (BLADE) ×3 IMPLANT
BNDG CMPR 9X6 STRL LF SNTH (GAUZE/BANDAGES/DRESSINGS) ×1
BNDG CMPR MED 10X6 ELC LF (GAUZE/BANDAGES/DRESSINGS) ×1
BNDG ELASTIC 6X10 VLCR STRL LF (GAUZE/BANDAGES/DRESSINGS) ×3 IMPLANT
BNDG ESMARK 6X9 LF (GAUZE/BANDAGES/DRESSINGS) ×3
BOWL SMART MIX CTS (DISPOSABLE) ×3 IMPLANT
CAP KNEE TOTAL 3 SIGMA ×3 IMPLANT
CEMENT HV SMART SET (Cement) ×6 IMPLANT
COVER SURGICAL LIGHT HANDLE (MISCELLANEOUS) ×3 IMPLANT
CUFF TOURNIQUET SINGLE 34IN LL (TOURNIQUET CUFF) ×3 IMPLANT
CUFF TOURNIQUET SINGLE 44IN (TOURNIQUET CUFF) IMPLANT
DECANTER SPIKE VIAL GLASS SM (MISCELLANEOUS) ×2 IMPLANT
DRAPE IMP U-DRAPE 54X76 (DRAPES) ×3 IMPLANT
DRAPE ORTHO SPLIT 77X108 STRL (DRAPES) ×6
DRAPE SURG ORHT 6 SPLT 77X108 (DRAPES) ×2 IMPLANT
DRAPE U-SHAPE 47X51 STRL (DRAPES) ×3 IMPLANT
DRSG PAD ABDOMINAL 8X10 ST (GAUZE/BANDAGES/DRESSINGS) ×4 IMPLANT
DURAPREP 26ML APPLICATOR (WOUND CARE) ×3 IMPLANT
ELECT REM PT RETURN 9FT ADLT (ELECTROSURGICAL) ×3
ELECTRODE REM PT RTRN 9FT ADLT (ELECTROSURGICAL) ×1 IMPLANT
FACESHIELD WRAPAROUND (MASK) ×9 IMPLANT
FACESHIELD WRAPAROUND OR TEAM (MASK) ×1 IMPLANT
GAUZE SPONGE 4X4 12PLY STRL (GAUZE/BANDAGES/DRESSINGS) ×3 IMPLANT
GAUZE XEROFORM 5X9 LF (GAUZE/BANDAGES/DRESSINGS) ×3 IMPLANT
GLOVE BIOGEL PI IND STRL 8 (GLOVE) ×2 IMPLANT
GLOVE BIOGEL PI INDICATOR 8 (GLOVE) ×4
GLOVE ORTHO TXT STRL SZ7.5 (GLOVE) ×6 IMPLANT
GOWN STRL REUS W/ TWL LRG LVL3 (GOWN DISPOSABLE) ×1 IMPLANT
GOWN STRL REUS W/ TWL XL LVL3 (GOWN DISPOSABLE) ×1 IMPLANT
GOWN STRL REUS W/TWL 2XL LVL3 (GOWN DISPOSABLE) ×3 IMPLANT
GOWN STRL REUS W/TWL LRG LVL3 (GOWN DISPOSABLE) ×3
GOWN STRL REUS W/TWL XL LVL3 (GOWN DISPOSABLE) ×3
HANDPIECE INTERPULSE COAX TIP (DISPOSABLE) ×3
IMMOBILIZER KNEE 22 UNIV (SOFTGOODS) ×3 IMPLANT
KIT BASIN OR (CUSTOM PROCEDURE TRAY) ×3 IMPLANT
KIT ROOM TURNOVER OR (KITS) ×3 IMPLANT
MANIFOLD NEPTUNE II (INSTRUMENTS) ×3 IMPLANT
MARKER SPHERE PSV REFLC THRD 5 (MARKER) ×9 IMPLANT
NEEDLE HYPO 25GX1X1/2 BEV (NEEDLE) ×3 IMPLANT
NS IRRIG 1000ML POUR BTL (IV SOLUTION) ×3 IMPLANT
PACK TOTAL JOINT (CUSTOM PROCEDURE TRAY) ×3 IMPLANT
PACK UNIVERSAL I (CUSTOM PROCEDURE TRAY) ×3 IMPLANT
PAD ARMBOARD 7.5X6 YLW CONV (MISCELLANEOUS) ×6 IMPLANT
PAD CAST 4YDX4 CTTN HI CHSV (CAST SUPPLIES) ×1 IMPLANT
PADDING CAST COTTON 4X4 STRL (CAST SUPPLIES) ×3
PADDING CAST COTTON 6X4 STRL (CAST SUPPLIES) ×3 IMPLANT
PIN SCHANZ 4MM 130MM (PIN) ×12 IMPLANT
SET HNDPC FAN SPRY TIP SCT (DISPOSABLE) ×1 IMPLANT
SPONGE GAUZE 4X4 12PLY STER LF (GAUZE/BANDAGES/DRESSINGS) ×3 IMPLANT
STAPLER VISISTAT 35W (STAPLE) ×3 IMPLANT
SUCTION FRAZIER TIP 10 FR DISP (SUCTIONS) ×3 IMPLANT
SUT ETHIBOND NAB CT1 #1 30IN (SUTURE) ×3 IMPLANT
SUT VIC AB 2-0 CT1 27 (SUTURE) ×6
SUT VIC AB 2-0 CT1 TAPERPNT 27 (SUTURE) ×2 IMPLANT
SUT VIC AB 3-0 SH 18 (SUTURE) ×3 IMPLANT
SYR CONTROL 10ML LL (SYRINGE) ×3 IMPLANT
TOWEL OR 17X24 6PK STRL BLUE (TOWEL DISPOSABLE) ×3 IMPLANT
TOWEL OR 17X26 10 PK STRL BLUE (TOWEL DISPOSABLE) ×3 IMPLANT
WATER STERILE IRR 1000ML POUR (IV SOLUTION) ×3 IMPLANT

## 2015-03-22 NOTE — Anesthesia Preprocedure Evaluation (Addendum)
Anesthesia Evaluation  Patient identified by MRN, date of birth, ID band Patient awake    Reviewed: Allergy & Precautions, NPO status , Patient's Chart, lab work & pertinent test results  History of Anesthesia Complications Negative for: history of anesthetic complications  Airway Mallampati: II  TM Distance: >3 FB Neck ROM: Full    Dental  (+) Upper Dentures, Lower Dentures   Pulmonary neg pulmonary ROS,  breath sounds clear to auscultation        Cardiovascular hypertension, Pt. on medications Rhythm:Regular     Neuro/Psych  Headaches, PSYCHIATRIC DISORDERS Anxiety Depression  Neuromuscular disease    GI/Hepatic negative GI ROS, Neg liver ROS,   Endo/Other  diabetesMorbid obesity  Renal/GU negative Renal ROS     Musculoskeletal  (+) Arthritis -,   Abdominal   Peds  Hematology   Anesthesia Other Findings   Reproductive/Obstetrics                            Anesthesia Physical Anesthesia Plan  ASA: III  Anesthesia Plan: MAC, Regional and Spinal   Post-op Pain Management:    Induction:   Airway Management Planned: Natural Airway  Additional Equipment: None  Intra-op Plan:   Post-operative Plan:   Informed Consent: I have reviewed the patients History and Physical, chart, labs and discussed the procedure including the risks, benefits and alternatives for the proposed anesthesia with the patient or authorized representative who has indicated his/her understanding and acceptance.     Plan Discussed with: CRNA and Surgeon  Anesthesia Plan Comments:         Anesthesia Quick Evaluation

## 2015-03-22 NOTE — Progress Notes (Signed)
Orthopedic Tech Progress Note Patient Details:  [name] [birth year] [id number]  Patient ID: [name], female   DOB: May 17, 1933, 81 y.o.   MRN: [id number]   Braulio Bosch 03/22/2015, 7:08 PM Off cpm at 7:10 pm

## 2015-03-22 NOTE — Transfer of Care (Signed)
Immediate Anesthesia Transfer of Care Note  Patient: [name]  Procedure(s) Performed: Procedure(s): COMPUTER ASSISTED TOTAL KNEE ARTHROPLASTY (Right)  Patient Location: PACU  Anesthesia Type:MAC, Regional and Spinal  Level of Consciousness: awake, alert , oriented and patient cooperative  Airway & Oxygen Therapy: Patient Spontanous Breathing and Patient connected to nasal cannula oxygen  Post-op Assessment: Report given to RN and Post -op Vital signs reviewed and stable  Post vital signs: Reviewed  Last Vitals:  Filed Vitals:   03/22/15 1032  BP: 133/46  Pulse: 66  Temp: 36.8 C  Resp: 18    Complications: No apparent anesthesia complications

## 2015-03-22 NOTE — H&P (Signed)
TOTAL KNEE ADMISSION H&P  Patient is being admitted for right total knee arthroplasty.  Subjective:  Chief Complaint:right knee pain.  HPI: [name], 81 y.o. female, has a history of pain and functional disability in the right knee due to arthritis and has failed non-surgical conservative treatments for greater than 12 weeks to includeNSAID's and/or analgesics, corticosteriod injections, use of assistive devices, weight reduction as appropriate and activity modification.  Onset of symptoms was gradual, starting 10 years ago with gradually worsening course since that time. The patient noted prior procedures on the knee to include  arthroscopy on the right knee(s).  Patient currently rates pain in the right knee(s) at 10 out of 10 with activity. Patient has night pain, worsening of pain with activity and weight bearing, pain that interferes with activities of daily living, pain with passive range of motion, crepitus and joint swelling.  Patient has evidence of subchondral cysts, subchondral sclerosis, periarticular osteophytes and joint space narrowing by imaging studies. . There is no active infection.  Patient Active Problem List   Diagnosis Date Noted  . Vaginitis and vulvovaginitis 03/16/2015  . Vaginal atrophy 03/16/2015  . Other constipation 03/16/2015  . IBS (irritable bowel syndrome) 02/27/2015  . Lumbar spondylosis 02/10/2015  . Lumbar radiculopathy 02/10/2015  . Osteoarthritis of right knee 02/10/2015  . Low back pain 11/05/2014  . Hashimoto's thyroiditis 05/05/2014  . Other abnormal glucose 02/21/2014  . Adrenal incidentaloma 11/04/2013  . Vertigo 07/15/2013  . Aortic stenosis, mild 07/14/2013  . OAB (overactive bladder) 02/24/2013  . GERD with stricture 12/24/2012  . Sleep-related hypoventilation 07/30/2012  . OSA (obstructive sleep apnea) 04/16/2012  . Osteopenia 04/16/2012  . Preventative health care 05/24/2011  . VITAMIN D DEFICIENCY 12/04/2010  . Hyperlipidemia with  target LDL less than 130 12/03/2010  . ANXIETY 12/03/2010  . DEPRESSION 12/03/2010  . Essential hypertension 12/03/2010   Past Medical History  Diagnosis Date  . Hyperlipidemia   . Hypertension   . Osteoarthritis   . Wears dentures   . Bruises easily   . Diarrhea   . Abdominal pain   . Wears glasses   . Shortness of breath     WITH EXERTION USES 2 L O2 BEDTIME  . Oxygen desaturation during sleep     USES 2 LITERS BEDTIME   VIA CPAP 06/2012 [facility]   . Anxiety   . Depression   . Sleep apnea     CPAP WITH O2 2 LITERS 2013 ([facility])  . Diabetes mellitus     type II  DIET CONTROLLED  . Chronic headaches     HISTORY OF   . Anemia     NOS  . Hepatitis     AGE 30S  . Chronic diastolic CHF (congestive heart failure)     a. 06/2013 EF 65-70%.  . Mild aortic stenosis     a. 06/2013 Echo: EF 65-70%, mod LVH with focal basal hypertrophy, very mild AS.    Past Surgical History  Procedure Laterality Date  . Appendectomy    . Cholecystectomy    . Abdominal hysterectomy    . Tonsillectomy    . Revision total knee arthroplasty  2011  . Rotator cuff repair  2010  . Tonsillectomy    . Joint replacement      LEFT KNEE   . Knee arthroscopy      RIGHT  . Shoulder arthroscopy  09/17/2012    Procedure: ARTHROSCOPY SHOULDER;  Surgeon: Sharmon Revere, MD;  Location:  [facility];  Service: Orthopedics;  Laterality: Left;  . Left and right heart catheterization with coronary angiogram N/A 10/28/2013    Procedure: LEFT AND RIGHT HEART CATHETERIZATION WITH CORONARY ANGIOGRAM;  Surgeon: Blane Ohara, MD;  Location: [facility] CATH LAB;  Service: Cardiovascular;  Laterality: N/A;  . Left heart catheterization with coronary angiogram N/A 11/14/2014    Procedure: LEFT HEART CATHETERIZATION WITH CORONARY ANGIOGRAM;  Surgeon: Josue Hector, MD;  Location: [facility] CATH LAB;  Service: Cardiovascular;  Laterality: N/A;    Prescriptions prior to admission  Medication Sig Dispense Refill Last Dose  . ALPRAZolam  (XANAX) 1 MG tablet Take 1 tablet (1 mg total) by mouth 3 (three) times daily as needed for anxiety. 90 tablet 5 03/22/2015 at 0700  . aspirin 81 MG chewable tablet Chew 1 tablet (81 mg total) by mouth daily. 30 tablet 1 03/21/2015 at Unknown time  . calcium carbonate (OS-CAL) 600 MG TABS tablet Take 600 mg by mouth 2 (two) times daily with a meal.   03/21/2015 at Unknown time  . Cholecalciferol (VITAMIN D3) 1000 UNITS CAPS Take 2 capsules (2,000 Units total) by mouth daily. 60 capsule 11 03/21/2015 at Unknown time  . conjugated estrogens (PREMARIN) vaginal cream Place 1 Applicatorful vaginally daily. 42.5 g 11 Past Week at Unknown time  . Eluxadoline (VIBERZI) 75 MG TABS Take 1 tablet by mouth 2 (two) times daily. 60 tablet 5 03/21/2015 at Unknown time  . gabapentin (NEURONTIN) 300 MG capsule Take 2 capsules (600 mg total) by mouth at bedtime. 60 capsule 3 03/21/2015 at 2200  . isosorbide-hydrALAZINE (BIDIL) 20-37.5 MG per tablet Take 0.5 tablets by mouth 2 (two) times daily. 30 tablet 11 03/21/2015 at Unknown time  . losartan (COZAAR) 100 MG tablet Take 1 tablet (100 mg total) by mouth daily. 90 tablet 3 03/21/2015 at Unknown time  . lubiprostone (AMITIZA) 24 MCG capsule Take 1 capsule (24 mcg total) by mouth 2 (two) times daily with a meal. 180 capsule 3 03/21/2015 at Unknown time  . meclizine (ANTIVERT) 25 MG tablet Take 25 mg by mouth 3 (three) times daily as needed for dizziness.   Past Month at Unknown time  . Multiple Minerals-Vitamins (CALCIUM & VIT D3 BONE HEALTH PO) Take 1 capsule by mouth daily.   03/21/2015 at Unknown time  . oxyCODONE-acetaminophen (PERCOCET) 7.5-325 MG per tablet Take 1 tablet by mouth every 4 (four) hours as needed. 90 tablet 0 03/22/2015 at 0700  . phenazopyridine (PYRIDIUM) 100 MG tablet Take 1 tablet (100 mg total) by mouth 3 (three) times daily as needed for pain. 30 tablet 0 Past Week at Unknown time  . potassium chloride SA (K-DUR,KLOR-CON) 20 MEQ tablet Take 1 tablet (20  mEq total) by mouth 2 (two) times daily. 180 tablet 1 03/21/2015 at Unknown time  . pravastatin (PRAVACHOL) 80 MG tablet Take 80 mg by mouth daily.   03/21/2015 at Unknown time  . traZODone (DESYREL) 100 MG tablet Take 2 tablets (200 mg total) by mouth at bedtime. 180 tablet 3 03/21/2015 at Unknown time   Allergies  Allergen Reactions  . Amlodipine     dizziness  . Enalapril     cough  . Metformin Diarrhea    Diarrhea, IBS    History  Substance Use Topics  . Smoking status: Never Smoker   . Smokeless tobacco: Never Used  . Alcohol Use: No    Family History  Problem Relation Age of Onset  . Alcohol abuse Other   .  Hypertension Other   . Kidney disease Other   . Mental illness Other   . Cancer Father     prostate cancer  . Heart disease Sister   . Heart disease Brother   . Emphysema Brother      Review of Systems  Constitutional: Negative.   Respiratory: Negative.   Cardiovascular: Negative.   Musculoskeletal: Positive for joint pain.  Neurological: Negative.     Objective:  Physical Exam PHYSICAL EXAMINATION:  Height 5 feet 2 inches, weight 185 pounds.  A pleasant elderly black female alert and oriented x3 and in no acute distress.  Gait is antalgic.  No increase in respiratory effort.  Negative log roll, bilateral hips.  Negative straight leg raise.  Right Knee:  She has good range of motion.  Positive patellofemoral crepitus.  Positive patellar grind.  Medial and lateral joint line tenderness.  Ligaments are stable.  There is some swelling with a small palpable effusion.  Calf nontender.  She is neurologically intact.  Skin warm and dry.  Vital signs in last 24 hours: Temp:  [98.2 F (36.8 C)] 98.2 F (36.8 C) (04/27 1032) Pulse Rate:  [66] 66 (04/27 1032) Resp:  [18] 18 (04/27 1032) BP: (133)/(46) 133/46 mmHg (04/27 1032) SpO2:  [95 %] 95 % (04/27 1032) Weight:  [85.73 kg (189 lb)] 85.73 kg (189 lb) (04/27 1032)  Labs:   Estimated body mass index is 34.56  kg/(m^2) as calculated from the following:   Height as of this encounter: 5\' 2"  (1.575 m).   Weight as of this encounter: 85.73 kg (189 lb).   Imaging Review Plain radiographs demonstrate moderate degenerative joint disease of the right knee(s). The overall alignment ismild varus. The bone quality appears to be good for age and reported activity level.  Assessment/Plan:  End stage arthritis, right knee   The patient history, physical examination, clinical judgment of the provider and imaging studies are consistent with end stage degenerative joint disease of the right knee(s) and total knee arthroplasty is deemed medically necessary. The treatment options including medical management, injection therapy arthroscopy and arthroplasty were discussed at length. The risks and benefits of total knee arthroplasty were presented and reviewed. The risks due to aseptic loosening, infection, stiffness, patella tracking problems, thromboembolic complications and other imponderables were discussed. The patient acknowledged the explanation, agreed to proceed with the plan and consent was signed. Patient is being admitted for inpatient treatment for surgery, pain control, PT, OT, prophylactic antibiotics, VTE prophylaxis, progressive ambulation and ADL's and discharge planning. The patient is planning to be discharged home with home health services

## 2015-03-22 NOTE — Anesthesia Procedure Notes (Addendum)
Spinal Patient location during procedure: OB Staffing Anesthesiologist: Reid Nawrot, CHRIS Preanesthetic Checklist Completed: patient identified, surgical consent, pre-op evaluation, timeout performed, IV checked, risks and benefits discussed and monitors and equipment checked Spinal Block Patient position: sitting Prep: site prepped and draped and DuraPrep Patient monitoring: heart rate, cardiac monitor, continuous pulse ox and blood pressure Approach: midline Location: L3-4 Injection technique: single-shot Needle Needle type: Pencan  Needle gauge: 24 G Needle length: 10 cm Assessment Sensory level: T6  Anesthesia Regional Block:  Adductor canal block  Pre-Anesthetic Checklist: ,, timeout performed, Correct Patient, Correct Site, Correct Laterality, Correct Procedure, Correct Position, site marked, Risks and benefits discussed,  Surgical consent,  Pre-op evaluation,  At surgeon's request and post-op pain management  Laterality: Lower and Right  Prep: chloraprep       Needles:  Injection technique: Single-shot  Needle Type: Echogenic Stimulator Needle          Additional Needles:  Procedures: ultrasound guided (picture in chart) Adductor canal block Narrative:  Injection made incrementally with aspirations every 5 mL.  Performed by: Personally  Anesthesiologist: Harve Spradley, CHRIS  Additional Notes: H+P and labs reviewed, risks and benefits discussed with patient, procedure tolerated well without complications

## 2015-03-22 NOTE — Brief Op Note (Signed)
03/22/2015  3:39 PM  PATIENT:  [name]  81 y.o. female  PRE-OPERATIVE DIAGNOSIS:  Osteoarthritis Right knee  POST-OPERATIVE DIAGNOSIS:  Osteoarthritis Right knee  PROCEDURE:  Procedure(s): COMPUTER ASSISTED TOTAL KNEE ARTHROPLASTY (Right)  SURGEON:  Surgeon(s) and Role:    * Marybelle Killings, MD - Primary  PHYSICIAN ASSISTANT: Nadirah Socorro m. [place] Churchill pa-c   ANESTHESIA:   general  EBL:  Total I/O In: 1000 [I.V.:1000] Out: 400 [Urine:400]  BLOOD ADMINISTERED:none  DRAINS: none   LOCAL MEDICATIONS USED:  MARCAINE     SPECIMEN:  No Specimen  DISPOSITION OF SPECIMEN:  N/A  COUNTS:  YES  TOURNIQUET:   Total Tourniquet Time Documented: Thigh (Right) - 66 minutes Total: Thigh (Right) - 66 minutes   PATIENT DISPOSITION:  PACU - hemodynamically stable.

## 2015-03-22 NOTE — Progress Notes (Signed)
Orthopedic Tech Progress Note Patient Details:  [name] [birth year] [id number]  CPM Right Knee CPM Right Knee: On Right Knee Flexion (Degrees): 90 Right Knee Extension (Degrees): 0 Additional Comments: trapeze bar patient helper Viewed order from doctor's order list  Hildred Priest 03/22/2015, 4:53 PM

## 2015-03-22 NOTE — Interval H&P Note (Signed)
History and Physical Interval Note:  03/22/2015 12:35 PM  [place]  has presented today for surgery, with the diagnosis of Osteoarthritis Right knee  The various methods of treatment have been discussed with the patient and family. After consideration of risks, benefits and other options for treatment, the patient has consented to  Procedure(s): COMPUTER ASSISTED TOTAL KNEE ARTHROPLASTY (Right) as a surgical intervention .  The patient's history has been reviewed, patient examined, no change in status, stable for surgery.  I have reviewed the patient's chart and labs.  Questions were answered to the patient's satisfaction.     Jaeveon Ashland C

## 2015-03-22 NOTE — Progress Notes (Signed)
Patient admitted to 4N06. VSS, neuro assessment unchanged according to report. Patient complains of "8/10" pain, patient is able to move all extremities. Call bell within patient's reach, will continue to monitor closely.

## 2015-03-23 ENCOUNTER — Encounter (HOSPITAL_COMMUNITY): Payer: Self-pay | Admitting: Orthopaedic Surgery

## 2015-03-23 LAB — BASIC METABOLIC PANEL
Anion gap: 7 (ref 5–15)
BUN: 10 mg/dL (ref 6–23)
CO2: 27 mmol/L (ref 19–32)
Calcium: 8.8 mg/dL (ref 8.4–10.5)
Chloride: 105 mmol/L (ref 96–112)
Creatinine, Ser: 0.87 mg/dL (ref 0.50–1.10)
GFR calc Af Amer: 71 mL/min — ABNORMAL LOW (ref >90)
GFR calc non Af Amer: 61 mL/min — ABNORMAL LOW (ref >90)
GLUCOSE: 172 mg/dL — AB (ref 70–99)
POTASSIUM: 4.7 mmol/L (ref 3.5–5.1)
SODIUM: 139 mmol/L (ref 135–145)

## 2015-03-23 LAB — CBC
HEMATOCRIT: 30.1 % — AB (ref 36.0–46.0)
Hemoglobin: 9.5 g/dL — ABNORMAL LOW (ref 12.0–15.0)
MCH: 30.4 pg (ref 26.0–34.0)
MCHC: 31.6 g/dL (ref 30.0–36.0)
MCV: 96.5 fL (ref 78.0–100.0)
Platelets: 141 10*3/uL — ABNORMAL LOW (ref 150–400)
RBC: 3.12 MIL/uL — AB (ref 3.87–5.11)
RDW: 13 % (ref 11.5–15.5)
WBC: 11.2 10*3/uL — AB (ref 4.0–10.5)

## 2015-03-23 MED ORDER — ALPRAZOLAM 0.5 MG PO TABS
0.5000 mg | ORAL_TABLET | Freq: Three times a day (TID) | ORAL | Status: DC | PRN
  Administered 2015-03-23 – 2015-03-25 (×3): 0.5 mg via ORAL
  Filled 2015-03-23 (×3): qty 1

## 2015-03-23 MED ORDER — HYDROCODONE-ACETAMINOPHEN 10-325 MG PO TABS
1.0000 | ORAL_TABLET | Freq: Four times a day (QID) | ORAL | Status: DC | PRN
  Administered 2015-03-23: 1 via ORAL
  Filled 2015-03-23: qty 1

## 2015-03-23 MED ORDER — KETOROLAC TROMETHAMINE 30 MG/ML IJ SOLN
30.0000 mg | Freq: Four times a day (QID) | INTRAMUSCULAR | Status: AC | PRN
  Administered 2015-03-23 – 2015-03-24 (×3): 30 mg via INTRAVENOUS
  Filled 2015-03-23 (×3): qty 1

## 2015-03-23 MED ORDER — HYDROCODONE-ACETAMINOPHEN 10-325 MG PO TABS
1.0000 | ORAL_TABLET | ORAL | Status: DC | PRN
  Administered 2015-03-23 – 2015-03-25 (×9): 1 via ORAL
  Filled 2015-03-23 (×9): qty 1

## 2015-03-23 NOTE — Progress Notes (Signed)
CARE MANAGEMENT NOTE 03/23/2015  Patient:  [name], [name]   Account Number:  [id number]  Date Initiated:  03/23/2015  Documentation initiated by:  Lorne Skeens  Subjective/Objective Assessment:   Patient was admitted for a right total knee.  Lives at home alone.     Action/Plan:   Will follow for discharge needs pending PT/OT evals and physician orders.   Anticipated DC Date:     Anticipated DC Plan:  SKILLED NURSING FACILITY  In-house referral  Clinical Social Worker      DC Planning Services  CM consult      Choice offered to / List presented to:             Status of service:  In process, will continue to follow Medicare Important Message given?   (If response is "NO", the following Medicare IM given date fields will be blank) Date Medicare IM given:   Medicare IM given by:   Date Additional Medicare IM given:   Additional Medicare IM given by:    Discharge Disposition:    Per UR Regulation:  Reviewed for med. necessity/level of care/duration of stay  If discussed at [place] of Stay Meetings, dates discussed:    Comments:

## 2015-03-23 NOTE — Progress Notes (Signed)
Spoke to [place] again r/t patient needs to be on CPM.  She will remind ortho tech to come and assist with CPM. Will ask 5N to assist also. Will continue to monitor  [place], RN

## 2015-03-23 NOTE — Progress Notes (Signed)
Ortho Tech called rt/ application of CPM. Per answering service, someone from Ortho tech will come and place patient on CPM. Will continue to monitor.

## 2015-03-23 NOTE — Op Note (Signed)
[name], [name] NO.:  [id number]  MEDICAL RECORD NO.:  [id number]  LOCATION:  4N06C                        FACILITY:  [facility]  PHYSICIAN:  Abrar Koone C. Lorin Mercy, M.D.    DATE OF BIRTH:  08/08/1933  DATE OF PROCEDURE:  03/22/2015 DATE OF DISCHARGE:                              OPERATIVE REPORT   PREOPERATIVE DIAGNOSIS:  Right knee osteoarthritis.  POSTOPERATIVE DIAGNOSIS:  Right knee osteoarthritis.  PROCEDURE:  Right total knee arthroplasty, computer assist.  SURGEON:  [name] C. Lorin Mercy, M.D.  ANESTHESIA:  Spinal.  ASSISTANT:  Alyson Locket. Ricard Dillon, PA-C, medically necessary and present for the entire procedure.  TOURNIQUET TIME:  One hour and 7 minutes x350.  EBL:  Minimal.  COMPONENTS:  DePuy LCS rotating platform, #4 femur, #3 tibia, 10-mm spacer, 41-mm 3 PEG patellar button.  DESCRIPTION OF PROCEDURE:  After induction of spinal anesthesia, the patient was placed on the operative table, careful positioning, lateral post, heel bump, proximal thigh tourniquet, 10 x 15 drape, DuraPrep, usual impervious stockinette, Coban, extremity sheets and drapes were applied.  The patient had 5-degree knee flexion contracture and 3 degrees of varus and flexed only to 120 degrees.  Tricompartmental degenerative changes were present.  Total knee split sheets, drapes had been applied and time-out procedure was completed.  Leg was wrapped with an Esmarch, tourniquet inflated. Midline incision was made.  Medial parapatellar incision splitting the quad tendon between the medial one-third and lateral two-thirds. Patella was everted and 10 mm removed off the patella, sized to a 41. Computer pins were placed.  After meniscectomy, removal of large marginal osteophytes.  The patient had chronic synovitis present, tricompartmental degenerative changes with areas of grade 4 chondromalacia.  Computer-generated images and confirmed mild varus and 5-degree flexion contracture.  The femur was  cut first, taking 9 mm off, tibia had 9 mm removed.  Chamfer cuts were made.  Medial collateral ligament was slightly tight, some of the deep fibers were slightly released.  All cuts were confirmed.  Box cut was made.  After chamfer cuts in the femur, keel cut was prepared for the tibia and then trials inserts were inserted.  Knee came out to full extension.  There was 1 mm difference between the medial and lateral.  Capsule of the medial slightly tighter, little additional resection of posterior medial meniscus was performed.  There was a little bit of PCL remnant, which was resected, which allowed full extension.  Spur was removed posteriorly with three cortical curved osteotome after trials were removed with the knee maximally flexed, pulling anteriorly with a bone hook on the femur.  Pulsatile lavage, vacuum mixing of the cement, insertion of the tibia followed by femur, permanent 10-mm rotating platform spacer and then patella held with the clamp.  The knee reached full extension.  Cement was hardened at 15 minutes.  Tourniquet deflated.  Hemostasis was obtained in standard layered closure, subcuticular closure on the skin, Steri- Strips.  The patient tolerated the procedure well.     [name] C. Lorin Mercy, M.D.     MCY/MEDQ  D:  03/22/2015  T:  03/23/2015  Job:  [id number]

## 2015-03-23 NOTE — Progress Notes (Signed)
Talked with Dr. Lorin Mercy about patient complaining of uncontrolled pain despite all prn medications given. Will give toradol and increase frequency of norco. Marcelyn Ditty A

## 2015-03-23 NOTE — Evaluation (Signed)
Occupational Therapy Evaluation Patient Details Name: [name] MRN: [id number] DOB: 04/11/1933 Today's Date: 03/23/2015    History of Present Illness 81 y.o. female s/p Rt TKA.PMH: IBS, Back surg, vertigo, Gerd, slpeed related hypoventilation, anxiety, depression HTN, DM2, CHF, anemia   Clinical Impression   Patient is s/p R TKR surgery resulting in functional limitations due to the deficits listed below (see OT problem list). PT demonstrates decr oxygen levels to 85% on RA during OT eval. See vitals input by tech Cloyde Reams.  Patient will benefit from skilled OT acutely to increase independence and safety with ADLS to allow discharge SNF.     Follow Up Recommendations  SNF    Equipment Recommendations       Recommendations for Other Services       Precautions / Restrictions Precautions Precautions: Knee Precaution Booklet Issued: Yes (comment) Precaution Comments: Reviewed knee precautions/exercises Required Braces or Orthoses: Knee Immobilizer - Right Knee Immobilizer - Right: On except when in CPM;Discontinue once straight leg raise with < 10 degree lag Restrictions Weight Bearing Restrictions: Yes RLE Weight Bearing: Weight bearing as tolerated      Mobility Bed Mobility Overal bed mobility: Needs Assistance Bed Mobility: Supine to Sit     Supine to sit: Min assist;HOB elevated     General bed mobility comments: in chair on arrival  Transfers Overall transfer level: Needs assistance Equipment used: Rolling walker (2 wheeled) Transfers: Sit to/from Stand Sit to Stand: Min assist         General transfer comment: mod v/c for hand placement and extending R LE    Balance Overall balance assessment: Needs assistance Sitting-balance support: No upper extremity supported;Feet supported Sitting balance-Leahy Scale: Fair     Standing balance support: Single extremity supported;During functional activity Standing balance-Leahy Scale: Poor                               ADL Overall ADL's : Needs assistance/impaired Eating/Feeding: Set up;Sitting   Grooming: Wash/dry hands;Min guard;Standing Grooming Details (indicate cue type and reason): cues for safety at sink level witih RW                 Toilet Transfer: Minimal assistance;Ambulation;RW Toilet Transfer Details (indicate cue type and reason): cues for safety and use of RW Toileting- Clothing Manipulation and Hygiene: Maximal assistance;Sit to/from stand       Functional mobility during ADLs: Moderate assistance;Rolling walker General ADL Comments: Pt needs constant cues for placement in RW and to sequence gait. tp pushing RW too far ahead and attempting to step with non surgical leg first. Pt able to bend at hips and touch ankle even in [place]. Pt will not require AE education. Pt provided ice on knee and KI to hold in place      Vision     Perception     Praxis      Pertinent Vitals/Pain Pain Assessment: 0-10 Pain Score: 5  Pain Location: Rt knee Pain Descriptors / Indicators: Aching Pain Intervention(s): Monitored during session;Premedicated before session;Repositioned     Hand Dominance Right   Extremity/Trunk Assessment Upper Extremity Assessment Upper Extremity Assessment: LUE deficits/detail LUE Deficits / Details: L shoulder rotator cuff repair   Lower Extremity Assessment Lower Extremity Assessment: Defer to PT evaluation RLE Deficits / Details: decreased setrength and ROM as expected post op.  RLE: Unable to fully assess due to pain   Cervical / Trunk Assessment Cervical / Trunk  Assessment: Kyphotic   Communication Communication Communication: No difficulties   Cognition Arousal/Alertness: Awake/alert Behavior During Therapy: WFL for tasks assessed/performed Overall Cognitive Status: Within Functional Limits for tasks assessed                     General Comments       Exercises Exercises: Total Joint     Shoulder  Instructions      Home Living Family/patient expects to be discharged to:: Skilled nursing facility Living Arrangements: Alone Available Help at Discharge: Family;Available PRN/intermittently Type of Home: House Home Access: Stairs to enter [employer] of Steps: 2 Entrance Stairs-Rails: Right;Left Home Layout: One level               Home Equipment: Cane - single point;Grab bars - toilet;Grab bars - tub/shower   Additional Comments: Pt requesting "[facility] because its close to her house. Daughter present and states "no mom you are going to [facility]"       Prior Functioning/Environment Level of Independence: Independent with assistive device(s)        Comments: cane for mobility. Ind with ADLs    OT Diagnosis: Generalized weakness;Acute pain   OT Problem List: Decreased strength;Decreased activity tolerance;Impaired balance (sitting and/or standing);Decreased safety awareness;Decreased knowledge of use of DME or AE;Decreased knowledge of precautions;Pain   OT Treatment/Interventions: Self-care/ADL training;Therapeutic exercise;DME and/or AE instruction;Therapeutic activities;Patient/family education;Balance training    OT Goals(Current goals can be found in the care plan section) Acute Rehab OT Goals Patient Stated Goal: to go to rehab OT Goal Formulation: With patient Time For Goal Achievement: 04/06/15 Potential to Achieve Goals: Good ADL Goals Pt Will Perform Grooming: with min assist;standing Pt Will Perform Upper Body Bathing: sitting;with set-up Pt Will Perform Lower Body Bathing: with min assist;sit to/from stand Pt Will Transfer to Toilet: with min guard assist;bedside commode;ambulating Additional ADL Goal #1: Pt will complete bed mobility with rail and hob <20 min (A) level  OT Frequency: Min 2X/week   Barriers to D/C:            Co-evaluation              End of Session Equipment Utilized During Treatment: Rolling  walker;Right knee immobilizer CPM Right Knee CPM Right Knee: Off Nurse Communication: Mobility status;Precautions  Activity Tolerance: Patient tolerated treatment well Patient left: in chair;with call bell/phone within reach   Time: 1106-1120 OT Time Calculation (min): 14 min Charges:  OT General Charges $OT Visit: 1 Procedure OT Evaluation $Initial OT Evaluation Tier I: 1 Procedure G-Codes:    Peri Maris Mar 28, 2015, 11:34 AM  Pager: [phone]

## 2015-03-23 NOTE — Anesthesia Postprocedure Evaluation (Signed)
  Anesthesia Post-op Note  Patient: [name]  Procedure(s) Performed: Procedure(s) (LRB): COMPUTER ASSISTED TOTAL KNEE ARTHROPLASTY (Right)  Patient Location: PACU  Anesthesia Type: General  Level of Consciousness: awake and alert   Airway and Oxygen Therapy: Patient Spontanous Breathing  Post-op Pain: mild  Post-op Assessment: Post-op Vital signs reviewed, Patient's Cardiovascular Status Stable, Respiratory Function Stable, Patent Airway and No signs of Nausea or vomiting  Last Vitals:  Filed Vitals:   03/23/15 1405  BP: 147/50  Pulse: 81  Temp: 37.3 C  Resp: 20    Post-op Vital Signs: stable   Complications: No apparent anesthesia complications

## 2015-03-23 NOTE — Progress Notes (Signed)
Subjective: 1 Day Post-Op Procedure(s) (LRB): COMPUTER ASSISTED TOTAL KNEE ARTHROPLASTY (Right) Patient reports pain as moderate and severe.  She has been on percocet 7.5 before the surgery and pain control expected to be more difficult.  Objective: Vital signs in last 24 hours: Temp:  [97.7 F (36.5 C)-100.7 F (38.2 C)] 99.8 F (37.7 C) (04/28 0616) Pulse Rate:  [47-85] 81 (04/28 0616) Resp:  [8-19] 18 (04/28 0616) BP: (94-146)/(40-103) 94/40 mmHg (04/28 0616) SpO2:  [2 %-100 %] 93 % (04/28 0616) Weight:  [85.73 kg (189 lb)] 85.73 kg (189 lb) (04/27 1032)  Intake/Output from previous day: 04/27 0701 - 04/28 0700 In: 1000 [I.V.:1000] Out: 400 [Urine:400] Intake/Output this shift:     Recent Labs  03/23/15 0517  HGB 9.5*    Recent Labs  03/23/15 0517  WBC 11.2*  RBC 3.12*  HCT 30.1*  PLT 141*   No results for input(s): NA, K, CL, CO2, BUN, CREATININE, GLUCOSE, CALCIUM in the last 72 hours. No results for input(s): LABPT, INR in the last 72 hours.  Neurologically intact  Assessment/Plan: 1 Day Post-Op Procedure(s) (LRB): COMPUTER ASSISTED TOTAL KNEE ARTHROPLASTY (Right) Up with therapy  YATES,MARK C 03/23/2015, 8:03 AM

## 2015-03-23 NOTE — Progress Notes (Signed)
Subjective: Pain controlled.  Drowsy.  No c/o cp, sob.     Objective: Vital signs in last 24 hours: Temp:  [97.7 F (36.5 C)-100.7 F (38.2 C)] 99.8 F (37.7 C) (04/28 0616) Pulse Rate:  [47-85] 73 (04/28 0845) Resp:  [8-19] 18 (04/28 0616) BP: (94-146)/(40-103) 118/53 mmHg (04/28 0845) SpO2:  [2 %-100 %] 97 % (04/28 0845) Weight:  [85.73 kg (189 lb)] 85.73 kg (189 lb) (04/27 1032)  Intake/Output from previous day: 04/27 0701 - 04/28 0700 In: 1000 [I.V.:1000] Out: 400 [Urine:400] Intake/Output this shift:     Recent Labs  03/23/15 0517  HGB 9.5*    Recent Labs  03/23/15 0517  WBC 11.2*  RBC 3.12*  HCT 30.1*  PLT 141*    Recent Labs  03/23/15 0517  NA 139  K 4.7  CL 105  CO2 27  BUN 10  CREATININE 0.87  GLUCOSE 172*  CALCIUM 8.8   No results for input(s): LABPT, INR in the last 72 hours.  Exam;  Dressing c/d/i.  Answers questions appropriately but drowsy.  NVI.   Assessment/Plan: Change oxycodone to norco.  D/c dilaudid.  Decrease xanax to 0.5mg  TID prn.  Needs short snf placement for rehab.     [name] M 03/23/2015, 8:52 AM

## 2015-03-23 NOTE — Evaluation (Signed)
Physical Therapy Evaluation Patient Details Name: JONEE LAMORE MRN: [id number] DOB: 10/20/1933 Today's Date: 03/23/2015   History of Present Illness  81 y.o. female s/p Rt TKA.  Clinical Impression  Pt is s/p the above procedure, presenting with the deficits listed below (see PT Problem List). Ambulates up to 20 feet today, tolerating gait training and therapeutic exercises well. Patient lives alone with limited family support. Would benefit from ST-SNF prior to returning home to progress patient's safety with functional mobility. Pt will benefit from skilled PT to increase their independence and safety with mobility to allow discharge to the venue listed below.      Follow Up Recommendations SNF    Equipment Recommendations  Rolling walker with 5" wheels    Recommendations for Other Services       Precautions / Restrictions Precautions Precautions: Knee Precaution Booklet Issued: Yes (comment) Precaution Comments: Reviewed knee precautions/exercises Required Braces or Orthoses: Knee Immobilizer - Right Restrictions Weight Bearing Restrictions: Yes RLE Weight Bearing: Weight bearing as tolerated      Mobility  Bed Mobility Overal bed mobility: Needs Assistance Bed Mobility: Supine to Sit     Supine to sit: Min assist;HOB elevated     General bed mobility comments: Min assist for pt to pull through RUE and scoot to edge of bed. Encouraged to use rail . VC for technique  Transfers Overall transfer level: Needs assistance Equipment used: Rolling walker (2 wheeled) Transfers: Sit to/from Stand Sit to Stand: Min assist         General transfer comment: Min assist for boost to rise. Required extra time to assume upright position.  VC for hand placement.  Ambulation/Gait Ambulation/Gait assistance: Min assist Ambulation Distance (Feet): 20 Feet Assistive device: Rolling walker (2 wheeled) Gait Pattern/deviations: Step-to pattern;Decreased step length - left;Decreased  stance time - right;Decreased stride length;Antalgic;Narrow base of support;Trunk flexed Gait velocity: slow   General Gait Details: Educated on safe DME use with a rolling walker. Very slow, small steps initially but progressed to a slightly improved gait speed and larger step length. No buckling noted with KI in place but pt tolerating limited WB through RLE. Min assist for balance, VC for upright posture.  Stairs            Wheelchair Mobility    Modified Rankin (Stroke Patients Only)       Balance Overall balance assessment: Needs assistance Sitting-balance support: No upper extremity supported;Feet supported Sitting balance-Leahy Scale: Fair     Standing balance support: Bilateral upper extremity supported Standing balance-Leahy Scale: Poor                               Pertinent Vitals/Pain Pain Assessment: 0-10 Pain Score: 7  Pain Location: Rt knee Pain Descriptors / Indicators: Aching Pain Intervention(s): Monitored during session;Repositioned    Home Living Family/patient expects to be discharged to:: Private residence Living Arrangements: Alone Available Help at Discharge: Family;Available PRN/intermittently Type of Home: House Home Access: Stairs to enter Entrance Stairs-Rails: Psychiatric nurse of Steps: 2 Home Layout: One level Home Equipment: Cane - single point;Grab bars - toilet;Grab bars - tub/shower      Prior Function Level of Independence: Independent with assistive device(s)         Comments: cane for mobility. Ind with ADLs     Hand Dominance   Dominant Hand: Right    Extremity/Trunk Assessment   Upper Extremity Assessment: Defer to OT evaluation  Lower Extremity Assessment: RLE deficits/detail RLE Deficits / Details: decreased setrength and ROM as expected post op.        Communication   Communication: No difficulties  Cognition Arousal/Alertness: Awake/alert Behavior During  Therapy: WFL for tasks assessed/performed Overall Cognitive Status: Within Functional Limits for tasks assessed                      General Comments      Exercises Total Joint Exercises Ankle Circles/Pumps: AROM;Both;10 reps;Seated Quad Sets: AROM;Right;10 reps;Seated Long Arc Quad: AAROM;Right;10 reps;Seated Knee Flexion: AAROM;Right;10 reps;Seated      Assessment/Plan    PT Assessment Patient needs continued PT services  PT Diagnosis Difficulty walking;Abnormality of gait;Acute pain   PT Problem List Decreased strength;Decreased range of motion;Decreased activity tolerance;Decreased balance;Decreased mobility;Decreased knowledge of use of DME;Decreased knowledge of precautions;Pain  PT Treatment Interventions DME instruction;Gait training;Functional mobility training;Therapeutic activities;Therapeutic exercise;Balance training;Neuromuscular re-education;Patient/family education;Modalities   PT Goals (Current goals can be found in the Care Plan section) Acute Rehab PT Goals Patient Stated Goal: Go to rehab PT Goal Formulation: With patient Time For Goal Achievement: 03/30/15 Potential to Achieve Goals: Good    Frequency 7X/week   Barriers to discharge Decreased caregiver support lives alone    Co-evaluation               End of Session Equipment Utilized During Treatment: Gait belt Activity Tolerance: Patient tolerated treatment well Patient left: in chair;with call bell/phone within reach;with chair alarm set Nurse Communication: Mobility status         Time: 3810-1751 PT Time Calculation (min) (ACUTE ONLY): 23 min   Charges:   PT Evaluation $Initial PT Evaluation Tier I: 1 Procedure PT Treatments $Gait Training: 8-22 mins   PT G CodesEllouise Newer 03/23/2015, 10:42 AM Elayne Snare, [place]

## 2015-03-23 NOTE — Progress Notes (Signed)
Orthopedic Tech Progress Note Patient Details:  [name] 01-29-1933 [id number] On cpm at 8:00 pm Patient ID: [name], female   DOB: Nov 30, 1932, 81 y.o.   MRN: [id number]   Braulio Bosch 03/23/2015, 7:54 PM

## 2015-03-23 NOTE — Progress Notes (Signed)
Physical Therapy Treatment Patient Details Name: SHELLEE STRENG MRN: [id number] DOB: 06-07-1933 Today's Date: 03/23/2015    History of Present Illness 81 y.o. female s/p Rt TKA.    PT Comments    Progressing towards physical therapy goals. Improved tolerance to gait training, ambulating up to 60 feet this afternoon without knee immobilizer, demonstrating good stability of Rt knee in stance phase, although still relies moderately on RW for support. Reviewed therapeutic exercises which she reports she has been performing on her own. Patient will continue to benefit from skilled physical therapy services to further improve independence with functional mobility.   Follow Up Recommendations  SNF     Equipment Recommendations  Rolling walker with 5" wheels    Recommendations for Other Services       Precautions / Restrictions Precautions Precautions: Knee Precaution Comments: Reviewed knee precautions/exercises Required Braces or Orthoses: Knee Immobilizer - Right Knee Immobilizer - Right: On except when in CPM ("Or working with PT") Restrictions Weight Bearing Restrictions: Yes RLE Weight Bearing: Weight bearing as tolerated    Mobility  Bed Mobility Overal bed mobility: Needs Assistance Bed Mobility: Supine to Sit     Supine to sit: HOB elevated;Min guard     General bed mobility comments: Min guard for safety. VC for technique, HOB elevated. Required extra time  Transfers Overall transfer level: Needs assistance Equipment used: Rolling walker (2 wheeled) Transfers: Sit to/from Stand Sit to Stand: Min assist         General transfer comment: Min assist for boost to rise. Required extra time to assume upright position.  VC for hand placement.  Ambulation/Gait Ambulation/Gait assistance: Min guard Ambulation Distance (Feet): 60 Feet Assistive device: Rolling walker (2 wheeled) Gait Pattern/deviations: Step-to pattern;Decreased step length - left;Decreased stance  time - right;Decreased stride length;Antalgic;Trunk flexed Gait velocity: slow   General Gait Details: Improved ability to bear weight through RLE this afternoon. Trialed without knee immobilizer and patient tolerated this well without any buckling of Rt knee. VC for upright posture, forward gaze, and Rt knee extension in stance phase for quad activation.   Stairs            Wheelchair Mobility    Modified Rankin (Stroke Patients Only)       Balance                                    Cognition Arousal/Alertness: Awake/alert Behavior During Therapy: WFL for tasks assessed/performed Overall Cognitive Status: Within Functional Limits for tasks assessed                      Exercises Total Joint Exercises Ankle Circles/Pumps: AROM;Both;10 reps;Seated Quad Sets: AROM;Right;10 reps;Seated Long Arc Quad: Right;10 reps;Seated;AROM Knee Flexion: AAROM;Right;10 reps;Seated (with hold at end range)    General Comments        Pertinent Vitals/Pain Pain Assessment: 0-10 Pain Score: 0-No pain Pain Intervention(s): Monitored during session    Home Living                      Prior Function            PT Goals (current goals can now be found in the care plan section) Acute Rehab PT Goals Patient Stated Goal: Go to rehab PT Goal Formulation: With patient Time For Goal Achievement: 03/30/15 Potential to Achieve Goals: Good Progress towards PT goals: Progressing toward  goals    Frequency  7X/week    PT Plan Current plan remains appropriate    Co-evaluation             End of Session Equipment Utilized During Treatment: Gait belt Activity Tolerance: Patient tolerated treatment well Patient left: in chair;with call bell/phone within reach;with family/visitor present     Time: 8099-8338 PT Time Calculation (min) (ACUTE ONLY): 12 min  Charges:  $Gait Training: 8-22 mins                    G Codes:      Ellouise Newer Mar 31, 2015, 5:38 PM Camille Bal [place], [place]

## 2015-03-24 LAB — CBC
HCT: 25.9 % — ABNORMAL LOW (ref 36.0–46.0)
Hemoglobin: 8.3 g/dL — ABNORMAL LOW (ref 12.0–15.0)
MCH: 31.3 pg (ref 26.0–34.0)
MCHC: 32 g/dL (ref 30.0–36.0)
MCV: 97.7 fL (ref 78.0–100.0)
Platelets: 111 10*3/uL — ABNORMAL LOW (ref 150–400)
RBC: 2.65 MIL/uL — AB (ref 3.87–5.11)
RDW: 13 % (ref 11.5–15.5)
WBC: 8.8 10*3/uL (ref 4.0–10.5)

## 2015-03-24 MED ORDER — METHOCARBAMOL 500 MG PO TABS: 500.0000 mg | ORAL_TABLET | Freq: Four times a day (QID) | ORAL | Status: DC | PRN

## 2015-03-24 MED ORDER — ASPIRIN 325 MG PO TBEC: 325.0000 mg | DELAYED_RELEASE_TABLET | Freq: Every day | ORAL | Status: DC

## 2015-03-24 MED ORDER — HYDROCODONE-ACETAMINOPHEN 10-325 MG PO TABS: 1.0000 | ORAL_TABLET | ORAL | Status: DC | PRN

## 2015-03-24 MED ORDER — HYDROCODONE-ACETAMINOPHEN 10-325 MG PO TABS: 1.0000 | ORAL_TABLET | Freq: Four times a day (QID) | ORAL | Status: DC | PRN

## 2015-03-24 NOTE — Progress Notes (Signed)
Physical Therapy Treatment Patient Details Name: [name] MRN: [id number] DOB: December 14, 1932 Today's Date: 03/24/2015    History of Present Illness 81 y.o. female s/p Rt TKA.    PT Comments    Good progress towards physical therapy goals, ambulating 85 feet x2 today and safely completed stair training. Patient states she has arranged for appropriate care at home following d/c and would like plan on returning home instead of SNF. Feel she has progressed well enough functionally and family will provide appropriate supervision that this would be a safe plan from a mobility standpoint when medically ready.  Follow Up Recommendations  Home health PT;Supervision for mobility/OOB     Equipment Recommendations  Rolling walker with 5" wheels    Recommendations for Other Services       Precautions / Restrictions Precautions Precautions: Knee Precaution Comments: Reviewed knee precautions/exercises Required Braces or Orthoses: Knee Immobilizer - Right Knee Immobilizer - Right: On except when in CPM ("Or working with PT") Restrictions Weight Bearing Restrictions: Yes RLE Weight Bearing: Weight bearing as tolerated    Mobility  Bed Mobility Overal bed mobility: Needs Assistance Bed Mobility: Supine to Sit     Supine to sit: HOB elevated;Supervision     General bed mobility comments: Supervision for safety. No assist required. Requires extra time  Transfers Overall transfer level: Needs assistance Equipment used: Rolling walker (2 wheeled) Transfers: Sit to/from Stand Sit to Stand: Supervision         General transfer comment: supervision for safety. VC for hand placement. No assist needed.  Ambulation/Gait Ambulation/Gait assistance: Supervision Ambulation Distance (Feet): 85 Feet (x2) Assistive device: Rolling walker (2 wheeled) Gait Pattern/deviations: Step-through pattern;Decreased step length - left;Decreased stance time - right;Antalgic;Trunk flexed Gait velocity:  slow   General Gait Details: Continues to progress with step-through gait pattern. VC for upright posture.  Intermittently. NO buckling of LE noted. Required one seated rest break at 85 feet, then able to continue back to room additonal 85 feet.   Stairs Stairs: Yes Stairs assistance: Min guard Stair Management: Two rails;Step to pattern;Forwards Number of Stairs: 2 General stair comments: Educated on stair navigation techniques with VC for sequencing. Able to perfrom at a min guard level without Rt knee buckling, using Rails for support.  Wheelchair Mobility    Modified Rankin (Stroke Patients Only)       Balance                                    Cognition Arousal/Alertness: Awake/alert Behavior During Therapy: WFL for tasks assessed/performed Overall Cognitive Status: Within Functional Limits for tasks assessed                      Exercises Total Joint Exercises Ankle Circles/Pumps: AROM;Both;10 reps;Seated Quad Sets: AROM;Right;10 reps;Seated Long Arc Quad: Right;10 reps;Seated;AROM;Strengthening Knee Flexion: AAROM;Right;10 reps;Seated Goniometric ROM: 4-88 degrees of Rt knee exension-flexion.    General Comments General comments (skin integrity, edema, etc.): States she has arranged for appropriate supervision at home and no longer wants to d/c to SNF.      Pertinent Vitals/Pain Pain Assessment: 0-10 Pain Score: 8  Pain Location: Rt knee Pain Descriptors / Indicators: Aching Pain Intervention(s): Monitored during session;Repositioned    Home Living                      Prior Function  PT Goals (current goals can now be found in the care plan section) Acute Rehab PT Goals Patient Stated Goal: Go home PT Goal Formulation: With patient Time For Goal Achievement: 03/30/15 Potential to Achieve Goals: Good Progress towards PT goals: Progressing toward goals    Frequency  7X/week    PT Plan Discharge plan  needs to be updated    Co-evaluation             End of Session Equipment Utilized During Treatment: Gait belt Activity Tolerance: Patient tolerated treatment well Patient left: in chair;with call bell/phone within reach;with family/visitor present     Time: 5885-0277 PT Time Calculation (min) (ACUTE ONLY): 19 min  Charges:  $Gait Training: 8-22 mins                    G Codes:      Ellouise Newer 12-Apr-2015, 3:55 PM Camille Bal [place], [place]

## 2015-03-24 NOTE — Progress Notes (Signed)
Subjective: Doing well.  Pain controlled.  Ambulated in hall.  States that she is wanting to d/c home and not snf for rehab.  Denies fatigue, lightheadedness.    Objective: Vital signs in last 24 hours: Temp:  [98.1 F (36.7 C)-100.1 F (37.8 C)] 98.1 F (36.7 C) (04/29 0939) Pulse Rate:  [62-83] 66 (04/29 0939) Resp:  [18-20] 18 (04/29 0939) BP: (82-156)/(39-56) 134/41 mmHg (04/29 0939) SpO2:  [0 %-100 %] 99 % (04/29 0939)  Intake/Output from previous day:   Intake/Output this shift:     Recent Labs  03/23/15 0517 03/24/15 0438  HGB 9.5* 8.3*    Recent Labs  03/23/15 0517 03/24/15 0438  WBC 11.2* 8.8  RBC 3.12* 2.65*  HCT 30.1* 25.9*  PLT 141* 111*    Recent Labs  03/23/15 0517  NA 139  K 4.7  CL 105  CO2 27  BUN 10  CREATININE 0.87  GLUCOSE 172*  CALCIUM 8.8   No results for input(s): LABPT, INR in the last 72 hours.  Exam:  Wound looks good.  steris intact.  No drainage or signs of infection.  Calf nontender, NVI.   Assessment/Plan: Discharge home tomorrow.  Continue PT.  Hgb 8.3, but asymptomatic.  Seen with Dr Lorin Mercy.    Alline Pio M 03/24/2015, 10:54 AM

## 2015-03-24 NOTE — Progress Notes (Signed)
Physical Therapy Treatment Patient Details Name: [name] MRN: [id number] DOB: [birth year] Today's Date: 03/24/2015    History of Present Illness 81 y.o. female s/p Rt TKA.    PT Comments    Gait training today, ambulates up to 70 feet with a rolling walker at a min guard level of assist. Pain improving and tolerates exercises well. Will continue to progress patient's functional abilities. Mrs. Pita will continue to benefit from skilled physical therapy services to further improve independence with functional mobility.   Follow Up Recommendations  SNF     Equipment Recommendations  Rolling walker with 5" wheels    Recommendations for Other Services       Precautions / Restrictions Precautions Precautions: Knee Precaution Comments: Reviewed knee precautions/exercises Required Braces or Orthoses: Knee Immobilizer - Right Knee Immobilizer - Right: On except when in CPM ("Or working with PT") Restrictions Weight Bearing Restrictions: Yes RLE Weight Bearing: Weight bearing as tolerated    Mobility  Bed Mobility Overal bed mobility: Needs Assistance Bed Mobility: Supine to Sit     Supine to sit: HOB elevated;Supervision     General bed mobility comments: Supervision for safety. No assist required. Use of rail.  Transfers Overall transfer level: Needs assistance Equipment used: Rolling walker (2 wheeled) Transfers: Sit to/from Stand Sit to Stand: Min guard         General transfer comment: Min guard for safety. VC for hand placement and to wait until RW is proper positioned prior to standing.  Ambulation/Gait Ambulation/Gait assistance: Min guard Ambulation Distance (Feet): 70 Feet Assistive device: Rolling walker (2 wheeled) Gait Pattern/deviations: Step-through pattern;Decreased step length - left;Decreased stance time - right;Decreased stride length;Antalgic Gait velocity: slow   General Gait Details: Focused on step through gait pattern which is slowly  emerging. VC for upright posture and to extend Rt knee in stance phase for quad activation. No buckling noted but moderately antalgic with step-through pattern   Stairs            Wheelchair Mobility    Modified Rankin (Stroke Patients Only)       Balance           Standing balance support: No upper extremity supported Standing balance-Leahy Scale: Fair                      Cognition Arousal/Alertness: Awake/alert Behavior During Therapy: WFL for tasks assessed/performed Overall Cognitive Status: Within Functional Limits for tasks assessed                      Exercises Total Joint Exercises Ankle Circles/Pumps: AROM;Both;10 reps;Seated Quad Sets: AROM;Right;10 reps;Seated Long Arc Quad: Right;10 reps;Seated;AROM;Strengthening Knee Flexion: AAROM;Right;10 reps;Seated    General Comments        Pertinent Vitals/Pain Pain Assessment: 0-10 Pain Score:  ("doing a lot better today" no value given) Pain Location: Rt knee Pain Intervention(s): Monitored during session    Home Living                      Prior Function            PT Goals (current goals can now be found in the care plan section) Acute Rehab PT Goals Patient Stated Goal: Go to rehab PT Goal Formulation: With patient Time For Goal Achievement: 03/30/15 Potential to Achieve Goals: Good Progress towards PT goals: Progressing toward goals    Frequency  7X/week    PT Plan Current plan  remains appropriate    Co-evaluation             End of Session Equipment Utilized During Treatment: Gait belt Activity Tolerance: Patient tolerated treatment well Patient left: in chair;with call bell/phone within reach;with family/visitor present     Time: 0825-0839 PT Time Calculation (min) (ACUTE ONLY): 14 min  Charges:  $Gait Training: 8-22 mins                    G Codes:      Ellouise Newer 04/18/2015, 9:46 AM Elayne Snare, [place]

## 2015-03-24 NOTE — Clinical Social Work Note (Signed)
Clinical Social Work Assessment  Patient Details  Name: [name] MRN: [id number] Date of Birth: Nov 08, 1933  Date of referral:  03/24/15               Reason for consult:  Facility Placement                Permission sought to share information with:  Case Manager, Customer service manager, PCP, Family Supports  Housing/Transportation Living arrangements for the past 2 months:  [place] of Information:  Patient, Adult Children Patient Interpreter Needed:  None Criminal Activity/Legal Involvement Pertinent to Current Situation/Hospitalization:  No - Comment as needed Significant Relationships:  Adult Children, Other Family Members, Neighbor Lives with:  Self Do you feel safe going back to the place where you live?  Yes Need for family participation in patient care:  Yes (Comment)  Care giving concerns:  No care giving concerns reported by pt or family.    Social Worker assessment / plan:  CSW met with pt and pt's family present at bedside in reference to post-acute placement for SNF. CSW introduced CSW role and explained SNF process. Pt reported initially she was agreeable to SNF placement to please her family however family has since then made arrangements for pt to return home with daughter. Pt stated she is happy to be returning home and believes that she will do much better in a familiar setting. Pt is from home alone and plans to stay with daughter while receiving Home Health services. At this time pt and pt's family are declining SNF placement and plans to return home with Home Health services. No further concerns reported by pt or family. RNCM notified. No further social work intervention required. Clinical Social Worker will sign off for now as social work intervention is no longer needed. Please consult [place] again if new need arises.   Employment status:  Retired Nurse, adult PT Recommendations:  [place] / Referral to community resources:  [place]  Patient/Family's Response to care:  Pt lying in bed alert and oriented X4. Pt and pt's family declining SNF placement.   Patient/Family's Understanding of and Emotional Response to Diagnosis, Current Treatment, and Prognosis:  Pt  Anad pt's family expressed understanding of surgical procedure and current discharge plan.   Emotional Assessment Appearance:  Appears younger than stated age Attitude/Demeanor/Rapport:  Other (Pleasant ) Affect (typically observed):  Appropriate, Happy, Pleasant, Calm Orientation:  Oriented to Self, Oriented to Place, Oriented to  Time, Oriented to Situation Alcohol / Substance use:  Never Used Psych involvement (Current and /or in the community):  No (Comment)  Discharge Needs  Concerns to be addressed:  Basic Needs Readmission within the last 30 days:  No Current discharge risk:  Dependent with Mobility Barriers to Discharge:  Barriers Resolved  Glendon Axe, MSW, LCSWA [phone] 03/24/2015 2:22 PM

## 2015-03-24 NOTE — Progress Notes (Signed)
CARE MANAGEMENT NOTE 03/24/2015  Patient:  TYMBER, STALLINGS   Account Number:  [id number]  Date Initiated:  03/23/2015  Documentation initiated by:  Lorne Skeens  Subjective/Objective Assessment:   Patient was admitted for a right total knee.  Lives at home alone.     Action/Plan:   Will follow for discharge needs pending PT/OT evals and physician orders.   Anticipated DC Date:  03/25/2015   Anticipated DC Plan:  [place]  In-house referral  Clinical Social Worker      DC Planning Services  CM consult      Choice offered to / List presented to:  C-1 Patient        [place] arranged  HH-1 RN  [place].   Status of service:  In process, will continue to follow Medicare Important Message given?  YES (If response is "NO", the following Medicare IM given date fields will be blank) Date Medicare IM given:  03/24/2015 Medicare IM given by:  Lorne Skeens Date Additional Medicare IM given:   Additional Medicare IM given by:    Discharge Disposition:    Per UR Regulation:  Reviewed for med. necessity/level of care/duration of stay  If discussed at [place] of Stay Meetings, dates discussed:    Comments:  03/24/15 [place], MSN, [place] with Jeneen Rinks, [place] for Dr Lorin Mercy regarding discharge disposition. Patient has chosen to discharge home rather than SNF and has good family support.  Per Jeneen Rinks, patient may discharge home with home health PT/OT/RN.  Patient's insurance requires a physican/practioner's signature for DME orders, which Jeneen Rinks will place later today.  Patient states that she has a 3N1 at home, but does not have a walker.  Patient has chosen [facility].  Miranda with [facility] was notified and has accepted the referral for discharge home tomorrow.  Address and phone number were verfied.  Awaiting DME orders prior to discharge.

## 2015-03-25 DIAGNOSIS — Z96651 Presence of right artificial knee joint: Secondary | ICD-10-CM | POA: Diagnosis not present

## 2015-03-25 LAB — CBC
HCT: 27.4 % — ABNORMAL LOW (ref 36.0–46.0)
Hemoglobin: 8.3 g/dL — ABNORMAL LOW (ref 12.0–15.0)
MCH: 29.3 pg (ref 26.0–34.0)
MCHC: 30.3 g/dL (ref 30.0–36.0)
MCV: 96.8 fL (ref 78.0–100.0)
PLATELETS: 137 10*3/uL — AB (ref 150–400)
RBC: 2.83 MIL/uL — ABNORMAL LOW (ref 3.87–5.11)
RDW: 12.8 % (ref 11.5–15.5)
WBC: 11.9 10*3/uL — ABNORMAL HIGH (ref 4.0–10.5)

## 2015-03-25 NOTE — Progress Notes (Signed)
Subjective: 3 Days Post-Op Procedure(s) (LRB): COMPUTER ASSISTED TOTAL KNEE ARTHROPLASTY (Right) Patient reports pain as 3 on 0-10 scale.    Objective: Vital signs in last 24 hours: Temp:  [98.1 F (36.7 C)-99.1 F (37.3 C)] 98.4 F (36.9 C) (04/30 0451) Pulse Rate:  [66-88] 73 (04/30 0451) Resp:  [18-20] 18 (04/30 0451) BP: (103-143)/(41-75) 103/56 mmHg (04/30 0451) SpO2:  [93 %-99 %] 94 % (04/30 0451)  Intake/Output from previous day:   Intake/Output this shift:     Recent Labs  03/23/15 0517 03/24/15 0438 03/25/15 0504  HGB 9.5* 8.3* 8.3*    Recent Labs  03/24/15 0438 03/25/15 0504  WBC 8.8 11.9*  RBC 2.65* 2.83*  HCT 25.9* 27.4*  PLT 111* 137*    Recent Labs  03/23/15 0517  NA 139  K 4.7  CL 105  CO2 27  BUN 10  CREATININE 0.87  GLUCOSE 172*  CALCIUM 8.8   No results for input(s): LABPT, INR in the last 72 hours.  Neurologically intact  Assessment/Plan: 3 Days Post-Op Procedure(s) (LRB): COMPUTER ASSISTED TOTAL KNEE ARTHROPLASTY (Right) Discharge home with home health  Carsin Randazzo C 03/25/2015, 8:50 AM

## 2015-03-25 NOTE — Discharge Instructions (Signed)
Knee Rehabilitation, Guidelines Following Surgery °Results after knee surgery are often greatly improved when you follow the exercise, range of motion and muscle strengthening exercises prescribed by your doctor. Safety measures are also important to protect the knee from further injury. Any time any of these exercises cause you to have increased pain or swelling in your knee joint, decrease the amount until you are comfortable again and slowly increase them. If you have problems or questions, call your caregiver or physical therapist for advice. °HOME CARE INSTRUCTIONS  °· Remove items at home which could result in a fall. This includes throw rugs or furniture in walking pathways. °· Continue medications as instructed. °· You may shower or take tub baths when your staples or stitches are removed or as instructed. °· Walk using crutches or walker as instructed. °· Put weight on your legs and walk as much as is comfortable. °· You may resume a sexual relationship in one month or when given the OK by your doctor. °· Return to work as instructed by your doctor. °· Do not drive a car for 6 weeks or as instructed. °· Wear elastic stockings until instructed not to. °· Make sure you keep all of your appointments after your operation with all of your doctors and caregivers. °RANGE OF MOTION AND STRENGTHENING EXERCISES °Rehabilitation of the knee is important following a knee injury or an operation. After just a few days of immobilization, the muscles of the thigh which control the knee become weakened and shrink (atrophy). Knee exercises are designed to build up the tone and strength of the thigh muscles and to improve knee motion. Often times heat used for twenty to thirty minutes before working out will loosen up your tissues and help with improving the range of motion. These exercises can be done on a training (exercise) mat, on the floor, on a table or on a bed. Use what ever works the best and is most comfortable for  you Knee exercises include: °· Leg Lifts - While your knee is still immobilized in a splint or cast, you can do straight leg raises. Lift the leg to 60 degrees, hold for 3 sec, and slowly lower the leg. Repeat 10-20 times 2-3 times daily. Perform this exercise against resistance later as your knee gets better. °· Quad and Hamstring Sets - Tighten up the muscle on the front of the thigh (Quad) and hold for 5-10 sec. Repeat this 10-20 times hourly. Hamstring sets are done by pushing the foot backward against an object and holding for 5-10 sec. Repeat as with quad sets. °· Resistance and Weight exercises - After your knee no longer needs to be immobilized, progressive motion, resistance, and weight lifting exercises should be performed. This is best done under the guidance of a physical therapist. You may safely start with wall squats; with your feet 10 inches from a wall, place your back flat against the wall and lower your trunk about 6 inches until you feel work in your thighs. Hold 20-40 seconds, then rise slowly. Do 3-6 repetitions 2-3 times daily. °· Endurance Training - Bicycle and walking are helpful in restoring strength and endurance to the leg. °A rehabilitation program following serious knee injuries can speed recovery and prevent re-injury in the future due to weakened muscles. Contact your doctor or a physical therapist for more information on knee rehabilitation. °MAKE SURE YOU:  °· Understand these instructions. °· Will watch your condition. °· Will get help right away if you are not doing   well or get worse. Document Released: 11/11/2005 Document Revised: 02/03/2012 Document Reviewed: 05/01/2007 [facility] Patient Information 2015 [place], [place]. This information is not intended to replace advice given to you by your health care provider. Make sure you discuss any questions you have with your health care provider.  Venous Thromboembolism, Prevention A venous thromboembolism is a blood clot that  forms in a vein. A blood clot in a deep vein is called a deep venous thrombosis (DVT). A blood clot in the lungs is called a pulmonary embolism (PE). Blood clots are dangerous and can cause death. Blood clots can form in the:  Lungs.  Legs.  Arms. CAUSES  A blood clot can form in a vein from different conditions. A blood clot can develop due to:  Blood flow within a vein that is sluggish or very slow.  Medical conditions that make the blood clot easily.  Vein damage. RISK FACTORS Risk factors can increase your risk of developing a blood clot. Risk factors can include:  Smoking.  Obesity.  Age.  Immobility or sedentary lifestyle.  Sitting or standing for long periods of time.  Chronic or long-term bedrest.  Medical or past history of blood clots.  Family history of blood clots.  Hip, leg, or pelvis injury or trauma.  Major surgery, especially surgery on the hip, knee, or abdomen.  Pregnancy and childbirth.  Birth control pills and hormone replacement therapy.  Medical conditions such as  Peripheral vascular disease (PVD).  Diabetes.  Cancer. SYMPTOMS  Symptoms of VTE can depend on where the clot is located and if the clot breaks off and travels to another organ. Sometimes, there may be no symptoms.   DVT symptoms can include:  Swelling of the leg or arm, especially on one side.  Warmth and redness of the leg or arm, especially on one side.  Pain in an arm or leg. Leg pain may be more noticeable or worse when standing or walking.  PE symptoms can include:  Shortness of breath.  Coughing.  Coughing up blood or blood-tinged mucus (hemoptysis).  Chest pain or chest pain with deep breaths (pleuritic chest pain).  Apprehension, anxiety, or a feeling of impending doom.  Rapid heartbeat. PREVENTION  Exercise regularly. Take a brisk 30 minute walk every day. Staying active and moving around can help prevent blood clots.  Avoid sitting or lying in  bed for long periods of time. Change your position often, especially during a long trip.  Women, especially those over the age of 65, should consider the risks and benefits of taking estrogen medicines. This includes birth control pills and hormone replacement therapy.  Do not smoke, especially if you take estrogen medicines. If you smoke, talk to your caregiver on how to quit.  Eat plenty of fruits and vegetables. Ask your caregiver or dietitian if there are foods you should avoid.  Maintain a weight as suggested by your caregiver.  Wear loose-fitting clothing. Avoid constrictive or tight clothing around your legs or waist.  Try not to bump or injure your legs. Avoid crossing your legs when you are sitting.  Do not use pillows under your knees unless told by your caregiver.  Take all medicines that your caregiver prescribes you.  Wear special stockings (compression stockings or TED hose) if your caregiver prescribes them.  Wearing compression stockings (support hose) can make the leg veins more narrow. This increases blood flow in the legs and can help prevent blood clots.  It is important to wear compression stockings  correctly. Do not let them bunch up when you are wearing them. TRAVEL Long distance travel can increase the risk of a blood clot. To prevent a blood clot when traveling:  You should exercise your legs by walking or by pumping your muscles every hour. To help prevent poor circulation on long trips, stand, stretch, and walk up and down the aisle of your airplane, train, or bus as often as possible to get the blood moving.  Do squats if you are able. If you are unable to do squats, raise your foot on the balls of your feet and tighten your lower leg muscles (particularly the calve muscles) while seated. Pointing (flexing and extending) your toes while tightening your calves while seated are also good exercises to do every hour during long trips. They help increase blood flow  and reduce risk of DVT.  Stay well hydrated. Drink water regularly when traveling, especially when you are sitting or immobile for long periods of time.  Use of drugs to prevent DVT during routine travel is not generally recommended. Before taking any drugs to reduce risk of DVT, consult your caregiver. SURGERY AND HOSPITALIZATION  People who are at high risk for a blood clot may be given a blood thinning medicine (anticoagulant) when they are hospitalized even if they are not going to have surgery.  A long trip prior to surgery can increase the risk of a clot for patients undergoing hip and knee replacements. Talk to your caregiver about travel plans before your surgery.  After hip or knee surgery, your caregiver may give you anticoagulants to help prevent blood clots.  Anticoagulants may be given to people at high risk of developing thromboembolism, before, during, or sometimes after surgery, including people with clotting disorders or with a history of past thromboembolism. TRAVEL AFTER SURGERY  In orthopedic surgery, the cutting of bones prompts the body to increase clotting factors in the blood. Due to the size of the bones involved in hip and knee replacements, there is a higher risk of blood clotting than other orthopedic surgeries.  There is a risk of clotting for up to 4-6 weeks after surgery. Flying or traveling long distances can increase your risk of a clot. As a result, those who travel long distances may need additional preventive measures after their procedure.  Drink only non-alcoholic beverages during your flight, train, or car travel. Alcohol can dehydrate you and increase your risk of getting blood clots. SEEK IMMEDIATE MEDICAL CARE IF:   You develop chest pain.  You develop severe shortness of breath.  You have breathing problems after traveling.  You develop swelling or pain in the leg.  You begin to cough up bloody mucus or phlegm (sputum).  You feel dizzy or  faint. Document Released: 10/30/2009 Document Revised: 08/05/2012 Document Reviewed: 10/30/2009 [facility] Patient Information 2015 [place], [place]. This information is not intended to replace advice given to you by your health care provider. Make sure you discuss any questions you have with your health care provider. Total Knee Replacement, Care After These instructions give you information on caring for yourself after your procedure. Your doctor also may give you specific instructions. Call your doctor if you have any problems or questions after your procedure. HOME CARE  See a physical therapist as told by your doctor.  Take medicines as told by your doctor.  Do not lift or drive until your doctor says it is okay.  Use crutches, a walker, or a cane as told by your doctor.  If you were given a knee joint motion machine, use it as told. GET HELP IF:  You have trouble breathing.  Your wound is red, puffy, or is getting more painful.  You have yellowish-white fluid (pus) coming from your wound.  You have a bad smell coming from your wound.  Your wound will not stop bleeding.  Your wound opens up after sutures (stitches) or staples are removed.  You have a fever. GET HELP RIGHT AWAY IF:   You have a rash.  You have shortness of breath or chest pain.  You have pain or puffiness (swelling) in your calf or thigh.  Your ability to move your knee is decreasing rather than increasing.  Your knee pain is increasing daily. MAKE SURE YOU:   Understand these instructions.  Will watch your condition.  Will get help right away if you are not doing well or get worse. Document Released: 02/03/2012 Document Revised: 03/28/2014 Document Reviewed: 02/03/2012 [facility] Patient Information 2015 [place], [place]. This information is not intended to replace advice given to you by your health care provider. Make sure you discuss any questions you have with your health care provider.

## 2015-03-25 NOTE — Progress Notes (Signed)
Patient's family have concerns about CPM not put on at specific time, MD notified.  Patient did get CPM on with the assistance of orthopedic RN from [facility]N at 2330 was on for 2 hours then removed and knee immobilizer was placed.

## 2015-03-25 NOTE — Progress Notes (Signed)
Pt d/c to home by car with family. Assessment stable. Prescriptions given. IV removed. Pt verbalizes understanding of d/c instructions and has no further questions. Case management communicating with pt regarding home health and equipment.

## 2015-03-25 NOTE — Progress Notes (Signed)
Physical Therapy Treatment Patient Details Name: [name] MRN: [id number] DOB: 1933/07/27 Today's Date: 03/25/2015    History of Present Illness 81 y.o. female s/p Rt TKA.    PT Comments    Patient is progressing well towards physical therapy goals, ambulating up to 170 feet with supervision. Reviewed knee precautions, and therapeutic exercises with pt and family. They have no further questions for PT at this time. Patient will continue to benefit from skilled physical therapy services at home with HHPT to further improve independence with functional mobility.    Follow Up Recommendations  Home health PT;Supervision for mobility/OOB     Equipment Recommendations  Rolling walker with 5" wheels    Recommendations for Other Services       Precautions / Restrictions Precautions Precautions: Knee Precaution Comments: Reviewed knee precautions/exercises Required Braces or Orthoses: Knee Immobilizer - Right Knee Immobilizer - Right: On except when in CPM ("Or working with PT") Restrictions Weight Bearing Restrictions: Yes RLE Weight Bearing: Weight bearing as tolerated    Mobility  Bed Mobility               General bed mobility comments: Sitting EOB  Transfers Overall transfer level: Needs assistance Equipment used: Rolling walker (2 wheeled) Transfers: Sit to/from Stand Sit to Stand: Supervision         General transfer comment: supervision for safety. VC for hand placement. No assist needed.  Ambulation/Gait Ambulation/Gait assistance: Supervision Ambulation Distance (Feet): 170 Feet Assistive device: Rolling walker (2 wheeled) Gait Pattern/deviations: Step-through pattern;Decreased step length - left;Decreased stance time - right;Antalgic;Trunk flexed Gait velocity: slow   General Gait Details: Still demonstrates decreased stance time on RLE and shortened Lt step length. One instance of slight buckling of Rt knee noted tooday however pt able to self  correct using RW for support. VC for Rt knee extension in stance phase to prevent this with quad activation. Cues for upright posture intermittently but improved towards end of distance.   Stairs            Wheelchair Mobility    Modified Rankin (Stroke Patients Only)       Balance                                    Cognition Arousal/Alertness: Awake/alert Behavior During Therapy: WFL for tasks assessed/performed Overall Cognitive Status: Within Functional Limits for tasks assessed                      Exercises Total Joint Exercises Ankle Circles/Pumps: AROM;Seated;Both;5 reps Long Arc Quad: Right;Seated;Strengthening;AAROM;5 reps Knee Flexion: Right;Seated;5 reps;AROM    General Comments General comments (skin integrity, edema, etc.): Reviewed all exercises, use of KI, and knee positioning for prorper healing.      Pertinent Vitals/Pain Pain Assessment: 0-10 Pain Score:  ("doing okay today" no value given) Pain Location: Rt knee Pain Descriptors / Indicators: Aching Pain Intervention(s): Monitored during session;Repositioned    Home Living                      Prior Function            PT Goals (current goals can now be found in the care plan section) Acute Rehab PT Goals Patient Stated Goal: Go home PT Goal Formulation: With patient Time For Goal Achievement: 03/30/15 Potential to Achieve Goals: Good Progress towards PT goals: Progressing toward goals  Frequency  7X/week    PT Plan Current plan remains appropriate    Co-evaluation             End of Session Equipment Utilized During Treatment: Gait belt Activity Tolerance: Patient tolerated treatment well Patient left: with call bell/phone within reach;with family/visitor present;in bed (sitting EOB)     Time: 1443-1540 PT Time Calculation (min) (ACUTE ONLY): 16 min  Charges:  $Gait Training: 8-22 mins                    G Codes:      Ellouise Newer 2015-04-18, 11:34 AM Elayne Snare, [place]

## 2015-03-27 DIAGNOSIS — G4733 Obstructive sleep apnea (adult) (pediatric): Secondary | ICD-10-CM | POA: Diagnosis not present

## 2015-03-27 DIAGNOSIS — F341 Dysthymic disorder: Secondary | ICD-10-CM | POA: Diagnosis not present

## 2015-03-27 DIAGNOSIS — Z96651 Presence of right artificial knee joint: Secondary | ICD-10-CM | POA: Diagnosis not present

## 2015-03-27 DIAGNOSIS — Z471 Aftercare following joint replacement surgery: Secondary | ICD-10-CM | POA: Diagnosis not present

## 2015-03-27 DIAGNOSIS — N952 Postmenopausal atrophic vaginitis: Secondary | ICD-10-CM | POA: Diagnosis not present

## 2015-03-27 DIAGNOSIS — K219 Gastro-esophageal reflux disease without esophagitis: Secondary | ICD-10-CM | POA: Diagnosis not present

## 2015-03-27 DIAGNOSIS — M545 Low back pain: Secondary | ICD-10-CM | POA: Diagnosis not present

## 2015-03-27 DIAGNOSIS — I10 Essential (primary) hypertension: Secondary | ICD-10-CM | POA: Diagnosis not present

## 2015-03-27 DIAGNOSIS — E785 Hyperlipidemia, unspecified: Secondary | ICD-10-CM | POA: Diagnosis not present

## 2015-03-28 DIAGNOSIS — G4733 Obstructive sleep apnea (adult) (pediatric): Secondary | ICD-10-CM | POA: Diagnosis not present

## 2015-03-28 DIAGNOSIS — K219 Gastro-esophageal reflux disease without esophagitis: Secondary | ICD-10-CM | POA: Diagnosis not present

## 2015-03-28 DIAGNOSIS — Z471 Aftercare following joint replacement surgery: Secondary | ICD-10-CM | POA: Diagnosis not present

## 2015-03-28 DIAGNOSIS — I10 Essential (primary) hypertension: Secondary | ICD-10-CM | POA: Diagnosis not present

## 2015-03-28 DIAGNOSIS — M545 Low back pain: Secondary | ICD-10-CM | POA: Diagnosis not present

## 2015-03-28 DIAGNOSIS — N952 Postmenopausal atrophic vaginitis: Secondary | ICD-10-CM | POA: Diagnosis not present

## 2015-03-28 DIAGNOSIS — F341 Dysthymic disorder: Secondary | ICD-10-CM | POA: Diagnosis not present

## 2015-03-28 DIAGNOSIS — E785 Hyperlipidemia, unspecified: Secondary | ICD-10-CM | POA: Diagnosis not present

## 2015-03-28 DIAGNOSIS — Z96651 Presence of right artificial knee joint: Secondary | ICD-10-CM | POA: Diagnosis not present

## 2015-03-29 DIAGNOSIS — F341 Dysthymic disorder: Secondary | ICD-10-CM | POA: Diagnosis not present

## 2015-03-29 DIAGNOSIS — N952 Postmenopausal atrophic vaginitis: Secondary | ICD-10-CM | POA: Diagnosis not present

## 2015-03-29 DIAGNOSIS — G4733 Obstructive sleep apnea (adult) (pediatric): Secondary | ICD-10-CM | POA: Diagnosis not present

## 2015-03-29 DIAGNOSIS — K219 Gastro-esophageal reflux disease without esophagitis: Secondary | ICD-10-CM | POA: Diagnosis not present

## 2015-03-29 DIAGNOSIS — M545 Low back pain: Secondary | ICD-10-CM | POA: Diagnosis not present

## 2015-03-29 DIAGNOSIS — I10 Essential (primary) hypertension: Secondary | ICD-10-CM | POA: Diagnosis not present

## 2015-03-29 DIAGNOSIS — Z96651 Presence of right artificial knee joint: Secondary | ICD-10-CM | POA: Diagnosis not present

## 2015-03-29 DIAGNOSIS — Z471 Aftercare following joint replacement surgery: Secondary | ICD-10-CM | POA: Diagnosis not present

## 2015-03-29 DIAGNOSIS — E785 Hyperlipidemia, unspecified: Secondary | ICD-10-CM | POA: Diagnosis not present

## 2015-03-30 ENCOUNTER — Telehealth: Payer: Self-pay | Admitting: *Deleted

## 2015-03-30 NOTE — Telephone Encounter (Signed)
Mrs Ikner called because she is saying that she had her surgery and the surgeon (Dr Lorin Mercy) does not "believe in giiving pain medication".  He did give her some hydrocodone but she says it is not helping and if she could have something stronger she could do her therapy better.  I looked in the [facility] and it looks like Dr Lorin Mercy gave her Hydrocodone 10/325 # 60 on 03/25/15, but she had just gotten #90 oxycodone 7.5/325 from Dr Scarlette Calico on 03/13/15.  I spoke with her and asked what happened to that medication and she does not even know what happened to the oxycodone.  I told her I was concerned about her if she took it and does not remember that could be a problem but she says she did not take it and doesn't even see a bottle.  She commented that she has had so many people in and out of the house lately she does not know what happened to it.  I am concerned about that but I told her I would discuss with Dr Naaman Plummer.  We have not started prescribing for her and his note says specifically when she is released we will talk about it.  She has an appt with Zella Ball on 04/21/15.  I believe she says she has an appt on Wednesday -with Yates(?) and will discuss it at that appt.

## 2015-03-30 NOTE — Telephone Encounter (Signed)
A UDS at next visit may be indicated to figure out what's what. i'm not opposed to prescribing, but dr. Lorin Mercy should be prescribing for post-op pain

## 2015-03-31 ENCOUNTER — Telehealth: Payer: Self-pay | Admitting: Internal Medicine

## 2015-03-31 DIAGNOSIS — E785 Hyperlipidemia, unspecified: Secondary | ICD-10-CM | POA: Diagnosis not present

## 2015-03-31 DIAGNOSIS — M545 Low back pain: Secondary | ICD-10-CM | POA: Diagnosis not present

## 2015-03-31 DIAGNOSIS — K222 Esophageal obstruction: Principal | ICD-10-CM

## 2015-03-31 DIAGNOSIS — Z96651 Presence of right artificial knee joint: Secondary | ICD-10-CM | POA: Diagnosis not present

## 2015-03-31 DIAGNOSIS — Z471 Aftercare following joint replacement surgery: Secondary | ICD-10-CM | POA: Diagnosis not present

## 2015-03-31 DIAGNOSIS — G4733 Obstructive sleep apnea (adult) (pediatric): Secondary | ICD-10-CM | POA: Diagnosis not present

## 2015-03-31 DIAGNOSIS — K219 Gastro-esophageal reflux disease without esophagitis: Secondary | ICD-10-CM | POA: Diagnosis not present

## 2015-03-31 DIAGNOSIS — I10 Essential (primary) hypertension: Secondary | ICD-10-CM | POA: Diagnosis not present

## 2015-03-31 DIAGNOSIS — F341 Dysthymic disorder: Secondary | ICD-10-CM | POA: Diagnosis not present

## 2015-03-31 DIAGNOSIS — N952 Postmenopausal atrophic vaginitis: Secondary | ICD-10-CM | POA: Diagnosis not present

## 2015-03-31 MED ORDER — PROMETHAZINE HCL 12.5 MG PO TABS: 12.5000 mg | ORAL_TABLET | Freq: Four times a day (QID) | ORAL | Status: DC | PRN

## 2015-03-31 NOTE — Telephone Encounter (Signed)
Spoke with home health nurse and advise that medication was removed off of her medication list per pt at her 11/28/14 appt. Pt misunderstood and does take as needed. Rx added back and rx sent

## 2015-03-31 NOTE — Telephone Encounter (Signed)
[name] [phone] [facility]   She said that they tried to her finagrin filled and pharmacy said it was declined.  They are wanting to know why?

## 2015-03-31 NOTE — Telephone Encounter (Signed)
Placed on schedule note and we will review expectations of CSA.

## 2015-03-31 NOTE — Discharge Summary (Signed)
Patient ID: [name] MRN: [id number] DOB/AGE: 79-Dec-1934 79 y.o.  Admit date: 03/22/2015 Discharge date: 03/31/2015  Admission Diagnoses:  Active Problems:   History of total right knee replacement   Discharge Diagnoses:  Active Problems:   History of total right knee replacement  status post Procedure(s): COMPUTER ASSISTED TOTAL KNEE ARTHROPLASTY  Past Medical History  Diagnosis Date  . Hyperlipidemia   . Hypertension   . Osteoarthritis   . Wears dentures   . Bruises easily   . Diarrhea   . Abdominal pain   . Wears glasses   . Shortness of breath     WITH EXERTION USES 2 L O2 BEDTIME  . Oxygen desaturation during sleep     USES 2 LITERS BEDTIME   VIA CPAP 06/2012 [facility]   . Anxiety   . Depression   . Sleep apnea     CPAP WITH O2 2 LITERS 2013 ([facility])  . Diabetes mellitus     type II  DIET CONTROLLED  . Chronic headaches     HISTORY OF   . Anemia     NOS  . Hepatitis     AGE 30S  . Chronic diastolic CHF (congestive heart failure)     a. 06/2013 EF 65-70%.  . Mild aortic stenosis     a. 06/2013 Echo: EF 65-70%, mod LVH with focal basal hypertrophy, very mild AS.    Surgeries: Procedure(s): COMPUTER ASSISTED TOTAL KNEE ARTHROPLASTY on 03/22/2015   Consultants:    Discharged Condition: Improved  Hospital Course: [name] is an 81 y.o. female who was admitted 03/22/2015 for operative treatment of right knee djd . Patient failed conservative treatments (please see the history and physical for the specifics) and had severe unremitting pain that affects sleep, daily activities and work/hobbies. After pre-op clearance, the patient was taken to the operating room on 03/22/2015 and underwent  Procedure(s): COMPUTER ASSISTED TOTAL KNEE ARTHROPLASTY.    Patient was given perioperative antibiotics:  Anti-infectives    Start     Dose/Rate Route Frequency Ordered Stop   03/22/15 0600  ceFAZolin (ANCEF) IVPB 2 g/50 mL premix     2 g 100 mL/hr over 30 Minutes  Intravenous On call to O.R. 03/21/15 1421 03/22/15 1301       Patient was given sequential compression devices and early ambulation to prevent DVT.   Patient benefited maximally from hospital stay and there were no complications. At the time of discharge, the patient was urinating/moving their bowels without difficulty, tolerating a regular diet, pain is controlled with oral pain medications and they have been cleared by PT/OT.   Recent vital signs: No data found.    Recent laboratory studies: No results for input(s): WBC, HGB, HCT, PLT, NA, K, CL, CO2, BUN, CREATININE, GLUCOSE, INR, CALCIUM in the last 72 hours.  Invalid input(s): PT, 2   Discharge Medications:     Medication List    STOP taking these medications        aspirin 81 MG chewable tablet  Replaced by:  aspirin 325 MG EC tablet     CALCIUM & VIT D3 BONE HEALTH PO     oxyCODONE-acetaminophen 7.5-325 MG per tablet  Commonly known as:  PERCOCET      TAKE these medications        ALPRAZolam 1 MG tablet  Commonly known as:  XANAX  Take 1 tablet (1 mg total) by mouth 3 (three) times daily as needed for anxiety.  aspirin 325 MG EC tablet  Take 1 tablet (325 mg total) by mouth daily.     calcium carbonate 600 MG Tabs tablet  Commonly known as:  OS-CAL  Take 600 mg by mouth 2 (two) times daily with a meal.     conjugated estrogens vaginal cream  Commonly known as:  PREMARIN  Place 1 Applicatorful vaginally daily.     Eluxadoline 75 MG Tabs  Commonly known as:  VIBERZI  Take 1 tablet by mouth 2 (two) times daily.     gabapentin 300 MG capsule  Commonly known as:  NEURONTIN  Take 2 capsules (600 mg total) by mouth at bedtime.     HYDROcodone-acetaminophen 10-325 MG per tablet  Commonly known as:  NORCO  Take 1 tablet by mouth every 6 (six) hours as needed.     HYDROcodone-acetaminophen 10-325 MG per tablet  Commonly known as:  NORCO  Take 1 tablet by mouth every 4 (four) hours as needed for moderate  pain.     isosorbide-hydrALAZINE 20-37.5 MG per tablet  Commonly known as:  BIDIL  Take 0.5 tablets by mouth 2 (two) times daily.     losartan 100 MG tablet  Commonly known as:  COZAAR  Take 1 tablet (100 mg total) by mouth daily.     lubiprostone 24 MCG capsule  Commonly known as:  AMITIZA  Take 1 capsule (24 mcg total) by mouth 2 (two) times daily with a meal.     meclizine 25 MG tablet  Commonly known as:  ANTIVERT  Take 25 mg by mouth 3 (three) times daily as needed for dizziness.     phenazopyridine 100 MG tablet  Commonly known as:  PYRIDIUM  Take 1 tablet (100 mg total) by mouth 3 (three) times daily as needed for pain.     potassium chloride SA 20 MEQ tablet  Commonly known as:  K-DUR,KLOR-CON  Take 1 tablet (20 mEq total) by mouth 2 (two) times daily.     pravastatin 80 MG tablet  Commonly known as:  PRAVACHOL  Take 80 mg by mouth daily.     traZODone 100 MG tablet  Commonly known as:  DESYREL  Take 2 tablets (200 mg total) by mouth at bedtime.     Vitamin D3 1000 UNITS Caps  Take 2 capsules (2,000 Units total) by mouth daily.        Diagnostic Studies: Dg Chest 2 View  03/10/2015   CLINICAL DATA:  Preop knee surgery. No chest complaints. History of hypertension.  EXAM: CHEST  2 VIEW  COMPARISON:  Chest CT and radiographs 05/19/2014  FINDINGS: Cardiac silhouette is upper limits of normal in size. Mild tortuosity and calcification of the thoracic aorta are again seen. Paratracheal soft tissue density at the thoracic inlet corresponds to thyroid goiter. The lungs are clear. No pleural effusion or pneumothorax is identified. No acute osseous abnormality is seen.  IMPRESSION: No active cardiopulmonary disease.   Electronically Signed   By: Logan Bores   On: 03/10/2015 17:22   Dg Knee Right Port  03/22/2015   CLINICAL DATA:  Status post right knee arthroplasty.  EXAM: PORTABLE RIGHT KNEE - 1-2 VIEW  COMPARISON:  None.  FINDINGS: Right knee prosthetic components are  well-seated and aligned. There is no acute fracture or evidence of an operative complication.  IMPRESSION: Well-aligned right knee prosthetic components.   Electronically Signed   By: Lajean Manes M.D.   On: 03/22/2015 16:50  Discharge Instructions    Call MD / Call 911    Complete by:  As directed   If you experience chest pain or shortness of breath, CALL 911 and be transported to the hospital emergency room.  If you develope a fever above 101 F, pus (white drainage) or increased drainage or redness at the wound, or calf pain, call your surgeon's office.     Change dressing    Complete by:  As directed   Change dressing on right knee daily with sterile 4 x 4 inch gauze dressing and apply TED hose.     Constipation Prevention    Complete by:  As directed   Drink plenty of fluids.  Prune juice may be helpful.  You may use a stool softener, such as Colace (over the counter) 100 mg twice a day.  Use MiraLax (over the counter) for constipation as needed.     Diet - low sodium heart healthy    Complete by:  As directed      Discharge instructions    Complete by:  As directed   Ok to shower 5 days postop, but no tub soaking.  Do not apply any creams or ointments to incision.  Continue physical therapy protocol.     Do not put a pillow under the knee. Place it under the heel.    Complete by:  As directed      Driving restrictions    Complete by:  As directed   No driving until further notice.     Increase activity slowly as tolerated    Complete by:  As directed      Lifting restrictions    Complete by:  As directed   No lifting until further notice.     TED hose    Complete by:  As directed   Use stockings (TED hose) for 3-4 weeks on both leg(s).  You may remove them at night for sleeping.           Follow-up Information    Schedule an appointment as soon as possible for a visit with Marybelle Killings, MD.   Specialty:  Orthopedic Surgery   Why:  need return office visit 2 weeks  postop   Contact information:   [place] [address] [phone]       Discharge Plan:  discharge to home  Disposition:     Signed: Lanae Crumbly  03/31/2015, 12:14 PM

## 2015-04-03 DIAGNOSIS — M545 Low back pain: Secondary | ICD-10-CM | POA: Diagnosis not present

## 2015-04-03 DIAGNOSIS — N952 Postmenopausal atrophic vaginitis: Secondary | ICD-10-CM | POA: Diagnosis not present

## 2015-04-03 DIAGNOSIS — F341 Dysthymic disorder: Secondary | ICD-10-CM | POA: Diagnosis not present

## 2015-04-03 DIAGNOSIS — Z471 Aftercare following joint replacement surgery: Secondary | ICD-10-CM | POA: Diagnosis not present

## 2015-04-03 DIAGNOSIS — Z96651 Presence of right artificial knee joint: Secondary | ICD-10-CM | POA: Diagnosis not present

## 2015-04-03 DIAGNOSIS — G4733 Obstructive sleep apnea (adult) (pediatric): Secondary | ICD-10-CM | POA: Diagnosis not present

## 2015-04-03 DIAGNOSIS — I10 Essential (primary) hypertension: Secondary | ICD-10-CM | POA: Diagnosis not present

## 2015-04-03 DIAGNOSIS — K219 Gastro-esophageal reflux disease without esophagitis: Secondary | ICD-10-CM | POA: Diagnosis not present

## 2015-04-03 DIAGNOSIS — E785 Hyperlipidemia, unspecified: Secondary | ICD-10-CM | POA: Diagnosis not present

## 2015-04-04 DIAGNOSIS — K219 Gastro-esophageal reflux disease without esophagitis: Secondary | ICD-10-CM | POA: Diagnosis not present

## 2015-04-04 DIAGNOSIS — F341 Dysthymic disorder: Secondary | ICD-10-CM | POA: Diagnosis not present

## 2015-04-04 DIAGNOSIS — Z471 Aftercare following joint replacement surgery: Secondary | ICD-10-CM | POA: Diagnosis not present

## 2015-04-04 DIAGNOSIS — G4733 Obstructive sleep apnea (adult) (pediatric): Secondary | ICD-10-CM | POA: Diagnosis not present

## 2015-04-04 DIAGNOSIS — N952 Postmenopausal atrophic vaginitis: Secondary | ICD-10-CM | POA: Diagnosis not present

## 2015-04-04 DIAGNOSIS — I10 Essential (primary) hypertension: Secondary | ICD-10-CM | POA: Diagnosis not present

## 2015-04-04 DIAGNOSIS — E785 Hyperlipidemia, unspecified: Secondary | ICD-10-CM | POA: Diagnosis not present

## 2015-04-04 DIAGNOSIS — M545 Low back pain: Secondary | ICD-10-CM | POA: Diagnosis not present

## 2015-04-04 DIAGNOSIS — Z96651 Presence of right artificial knee joint: Secondary | ICD-10-CM | POA: Diagnosis not present

## 2015-04-05 DIAGNOSIS — E785 Hyperlipidemia, unspecified: Secondary | ICD-10-CM | POA: Diagnosis not present

## 2015-04-05 DIAGNOSIS — F341 Dysthymic disorder: Secondary | ICD-10-CM | POA: Diagnosis not present

## 2015-04-05 DIAGNOSIS — M1711 Unilateral primary osteoarthritis, right knee: Secondary | ICD-10-CM | POA: Diagnosis not present

## 2015-04-05 DIAGNOSIS — Z96651 Presence of right artificial knee joint: Secondary | ICD-10-CM | POA: Diagnosis not present

## 2015-04-05 DIAGNOSIS — I10 Essential (primary) hypertension: Secondary | ICD-10-CM | POA: Diagnosis not present

## 2015-04-05 DIAGNOSIS — Z471 Aftercare following joint replacement surgery: Secondary | ICD-10-CM | POA: Diagnosis not present

## 2015-04-05 DIAGNOSIS — M545 Low back pain: Secondary | ICD-10-CM | POA: Diagnosis not present

## 2015-04-05 DIAGNOSIS — N952 Postmenopausal atrophic vaginitis: Secondary | ICD-10-CM | POA: Diagnosis not present

## 2015-04-05 DIAGNOSIS — K219 Gastro-esophageal reflux disease without esophagitis: Secondary | ICD-10-CM | POA: Diagnosis not present

## 2015-04-05 DIAGNOSIS — G4733 Obstructive sleep apnea (adult) (pediatric): Secondary | ICD-10-CM | POA: Diagnosis not present

## 2015-04-06 DIAGNOSIS — G4733 Obstructive sleep apnea (adult) (pediatric): Secondary | ICD-10-CM | POA: Diagnosis not present

## 2015-04-06 DIAGNOSIS — K219 Gastro-esophageal reflux disease without esophagitis: Secondary | ICD-10-CM | POA: Diagnosis not present

## 2015-04-06 DIAGNOSIS — F341 Dysthymic disorder: Secondary | ICD-10-CM | POA: Diagnosis not present

## 2015-04-06 DIAGNOSIS — Z96651 Presence of right artificial knee joint: Secondary | ICD-10-CM | POA: Diagnosis not present

## 2015-04-06 DIAGNOSIS — N952 Postmenopausal atrophic vaginitis: Secondary | ICD-10-CM | POA: Diagnosis not present

## 2015-04-06 DIAGNOSIS — Z471 Aftercare following joint replacement surgery: Secondary | ICD-10-CM | POA: Diagnosis not present

## 2015-04-06 DIAGNOSIS — I10 Essential (primary) hypertension: Secondary | ICD-10-CM | POA: Diagnosis not present

## 2015-04-06 DIAGNOSIS — M545 Low back pain: Secondary | ICD-10-CM | POA: Diagnosis not present

## 2015-04-06 DIAGNOSIS — E785 Hyperlipidemia, unspecified: Secondary | ICD-10-CM | POA: Diagnosis not present

## 2015-04-07 ENCOUNTER — Encounter: Payer: Self-pay | Admitting: Internal Medicine

## 2015-04-07 DIAGNOSIS — N952 Postmenopausal atrophic vaginitis: Secondary | ICD-10-CM | POA: Diagnosis not present

## 2015-04-07 DIAGNOSIS — I10 Essential (primary) hypertension: Secondary | ICD-10-CM | POA: Diagnosis not present

## 2015-04-07 DIAGNOSIS — Z471 Aftercare following joint replacement surgery: Secondary | ICD-10-CM | POA: Diagnosis not present

## 2015-04-07 DIAGNOSIS — Z96651 Presence of right artificial knee joint: Secondary | ICD-10-CM | POA: Diagnosis not present

## 2015-04-07 DIAGNOSIS — K219 Gastro-esophageal reflux disease without esophagitis: Secondary | ICD-10-CM | POA: Diagnosis not present

## 2015-04-07 DIAGNOSIS — E785 Hyperlipidemia, unspecified: Secondary | ICD-10-CM | POA: Diagnosis not present

## 2015-04-07 DIAGNOSIS — G4733 Obstructive sleep apnea (adult) (pediatric): Secondary | ICD-10-CM | POA: Diagnosis not present

## 2015-04-07 DIAGNOSIS — M545 Low back pain: Secondary | ICD-10-CM | POA: Diagnosis not present

## 2015-04-07 DIAGNOSIS — F341 Dysthymic disorder: Secondary | ICD-10-CM | POA: Diagnosis not present

## 2015-04-11 DIAGNOSIS — M25561 Pain in right knee: Secondary | ICD-10-CM | POA: Diagnosis not present

## 2015-04-11 DIAGNOSIS — R262 Difficulty in walking, not elsewhere classified: Secondary | ICD-10-CM | POA: Diagnosis not present

## 2015-04-11 DIAGNOSIS — Z96651 Presence of right artificial knee joint: Secondary | ICD-10-CM | POA: Diagnosis not present

## 2015-04-11 DIAGNOSIS — M25661 Stiffness of right knee, not elsewhere classified: Secondary | ICD-10-CM | POA: Diagnosis not present

## 2015-04-12 ENCOUNTER — Other Ambulatory Visit: Payer: Self-pay | Admitting: Orthopaedic Surgery

## 2015-04-12 DIAGNOSIS — M79604 Pain in right leg: Secondary | ICD-10-CM

## 2015-04-13 ENCOUNTER — Ambulatory Visit
Admission: RE | Admit: 2015-04-13 | Discharge: 2015-04-13 | Disposition: A | Discharge status: Home / Self Care | Payer: Commercial Managed Care - HMO | Source: Ambulatory Visit | Attending: Orthopaedic Surgery | Admitting: Orthopaedic Surgery

## 2015-04-13 DIAGNOSIS — M79604 Pain in right leg: Secondary | ICD-10-CM | POA: Diagnosis not present

## 2015-04-13 DIAGNOSIS — R2243 Localized swelling, mass and lump, lower limb, bilateral: Secondary | ICD-10-CM | POA: Diagnosis not present

## 2015-04-19 DIAGNOSIS — Z96651 Presence of right artificial knee joint: Secondary | ICD-10-CM | POA: Diagnosis not present

## 2015-04-19 DIAGNOSIS — M25661 Stiffness of right knee, not elsewhere classified: Secondary | ICD-10-CM | POA: Diagnosis not present

## 2015-04-19 DIAGNOSIS — R262 Difficulty in walking, not elsewhere classified: Secondary | ICD-10-CM | POA: Diagnosis not present

## 2015-04-19 DIAGNOSIS — M25561 Pain in right knee: Secondary | ICD-10-CM | POA: Diagnosis not present

## 2015-04-21 ENCOUNTER — Encounter: Payer: Self-pay | Admitting: Registered Nurse

## 2015-04-21 ENCOUNTER — Other Ambulatory Visit: Payer: Self-pay | Admitting: Registered Nurse

## 2015-04-21 ENCOUNTER — Encounter: Payer: Commercial Managed Care - HMO | Attending: Physical Medicine & Rehabilitation | Admitting: Registered Nurse

## 2015-04-21 VITALS — BP 144/78 | HR 68 | Resp 14

## 2015-04-21 DIAGNOSIS — M5416 Radiculopathy, lumbar region: Secondary | ICD-10-CM | POA: Diagnosis not present

## 2015-04-21 DIAGNOSIS — G894 Chronic pain syndrome: Secondary | ICD-10-CM

## 2015-04-21 DIAGNOSIS — M1711 Unilateral primary osteoarthritis, right knee: Secondary | ICD-10-CM | POA: Diagnosis not present

## 2015-04-21 DIAGNOSIS — M47816 Spondylosis without myelopathy or radiculopathy, lumbar region: Secondary | ICD-10-CM

## 2015-04-21 DIAGNOSIS — Z5181 Encounter for therapeutic drug level monitoring: Secondary | ICD-10-CM | POA: Diagnosis not present

## 2015-04-21 DIAGNOSIS — Z79899 Other long term (current) drug therapy: Secondary | ICD-10-CM

## 2015-04-21 DIAGNOSIS — Z96651 Presence of right artificial knee joint: Secondary | ICD-10-CM

## 2015-04-21 DIAGNOSIS — M5440 Lumbago with sciatica, unspecified side: Secondary | ICD-10-CM | POA: Diagnosis not present

## 2015-04-21 MED ORDER — OXYCODONE-ACETAMINOPHEN 7.5-325 MG PO TABS: 1.0000 | ORAL_TABLET | Freq: Two times a day (BID) | ORAL | Status: DC | PRN

## 2015-04-21 MED ORDER — MORPHINE SULFATE ER 15 MG PO TBCR: 15.0000 mg | EXTENDED_RELEASE_TABLET | Freq: Two times a day (BID) | ORAL | Status: DC

## 2015-04-21 MED ORDER — OXYCODONE-ACETAMINOPHEN 7.5-325 MG PO TABS: 1.0000 | ORAL_TABLET | Freq: Three times a day (TID) | ORAL | Status: DC | PRN

## 2015-04-21 NOTE — Progress Notes (Signed)
Subjective:    Patient ID: [name], female    DOB: January 15, 1933, 81 y.o.   MRN: [id number]  HPI: [name] is a 81 year old female who returns for follow up for chronic pain and medication refill. She say's her pain has intensified in her lower back radiating into her lower extremities posteriorly and right knee.  Also states Dr. Naaman Plummer was going to prescribed her a long acting medication.She rates her pain 8. Her current exercise regime is physical therapy two-three times a week.  She was admitted to [facility] on 03/22/2015 for Right Total Knee Arthroplasty and discharged 03/31/2015. Upon reviewing Dr. Naaman Plummer notes we will follow his recommendation for MS Contin 15 mg q12 hours, she verbalizes understanding.  One set of script discarded due to error entry  Pain Inventory Average Pain 7 Pain Right Now 8 My pain is constant, sharp, dull, stabbing, tingling and aching  In the last 24 hours, has pain interfered with the following? General activity 10 Relation with others 8 Enjoyment of life 10 What TIME of day is your pain at its worst? morning Sleep (in general) Poor  Pain is worse with: walking, bending, sitting, standing and some activites Pain improves with: rest and medication Relief from Meds: 4  Mobility walk without assistance use a walker how many minutes can you walk? 5 ability to climb steps?  no do you drive?  no  Function retired  Neuro/Psych weakness numbness trouble walking dizziness depression anxiety  Prior Studies Any changes since last visit?  no  Physicians involved in your care Any changes since last visit?  no   Family History  Problem Relation Age of Onset  . Alcohol abuse Other   . Hypertension Other   . Kidney disease Other   . Mental illness Other   . Cancer Father     prostate cancer  . Heart disease Sister   . Heart disease Brother   . Emphysema Brother    History   Social History  . Marital Status: Widowed      Spouse Name: N/A  . Number of Children: 5  . Years of Education: N/A   Occupational History  . retired      Museum/gallery curator at [employer] Topics  . Smoking status: Never Smoker   . Smokeless tobacco: Never Used  . Alcohol Use: No  . Drug Use: No  . Sexual Activity: Not Currently   Other Topics Concern  . None   Social History Narrative   Past Surgical History  Procedure Laterality Date  . Appendectomy    . Cholecystectomy    . Abdominal hysterectomy    . Tonsillectomy    . Revision total knee arthroplasty  2011  . Rotator cuff repair  2010  . Tonsillectomy    . Joint replacement      LEFT KNEE   . Knee arthroscopy      RIGHT  . Shoulder arthroscopy  09/17/2012    Procedure: ARTHROSCOPY SHOULDER;  Surgeon: Sharmon Revere, MD;  Location: [place];  Service: Orthopedics;  Laterality: Left;  . Left and right heart catheterization with coronary angiogram N/A 10/28/2013    Procedure: LEFT AND RIGHT HEART CATHETERIZATION WITH CORONARY ANGIOGRAM;  Surgeon: Blane Ohara, MD;  Location: [facility];  Service: Cardiovascular;  Laterality: N/A;  . Left heart catheterization with coronary angiogram N/A 11/14/2014    Procedure: LEFT HEART CATHETERIZATION WITH CORONARY ANGIOGRAM;  Surgeon: Josue Hector, MD;  Location: [facility] C[facility];  Service: Cardiovascular;  Laterality: N/A;  . Knee arthroplasty Right 03/22/2015    Procedure: COMPUTER ASSISTED TOTAL KNEE ARTHROPLASTY;  Surgeon: Marybelle Killings, MD;  Location: [place];  Service: Orthopedics;  Laterality: Right;   Past Medical History  Diagnosis Date  . Hyperlipidemia   . Hypertension   . Osteoarthritis   . Wears dentures   . Bruises easily   . Diarrhea   . Abdominal pain   . Wears glasses   . Shortness of breath     WITH EXERTION USES 2 L O2 BEDTIME  . Oxygen desaturation during sleep     USES 2 LITERS BEDTIME   VIA CPAP 06/2012 [facility]   . Anxiety   . Depression   . Sleep apnea     CPAP WITH O2 2 LITERS  2013 ([facility])  . Diabetes mellitus     type II  DIET CONTROLLED  . Chronic headaches     HISTORY OF   . Anemia     NOS  . Hepatitis     AGE 30S  . Chronic diastolic CHF (congestive heart failure)     a. 06/2013 EF 65-70%.  . Mild aortic stenosis     a. 06/2013 Echo: EF 65-70%, mod LVH with focal basal hypertrophy, very mild AS.   BP 144/78 mmHg  Pulse 68  Resp 14  SpO2 95%  Opioid Risk Score:   Fall Risk Score: Moderate Fall Risk (6-13 points)`1  Depression screen PHQ 2/9  Depression screen [facility] 2/9 03/20/2015 02/10/2015 04/18/2013 04/16/2013 04/05/2013  Decreased Interest 2 3 0 0 0  Down, Depressed, Hopeless 1 3 0 0 0  PHQ - 2 Score 3 6 0 0 0  Altered sleeping 1 3 - - -  Tired, decreased energy 3 3 - - -  Change in appetite 1 0 - - -  Feeling bad or failure about yourself  0 1 - - -  Trouble concentrating 3 1 - - -  Moving slowly or fidgety/restless 3 1 - - -  Suicidal thoughts 0 0 - - -  PHQ-9 Score 14 15 - - -     Review of Systems  HENT: Negative.   Eyes: Negative.   Respiratory: Negative.   Cardiovascular: Negative.   Gastrointestinal: Positive for constipation.  Endocrine: Negative.   Musculoskeletal: Positive for myalgias, back pain, joint swelling and arthralgias.  Skin: Negative.   Allergic/Immunologic: Negative.   Neurological: Positive for dizziness, weakness and numbness.       Trouble walking  Hematological: Negative.   Psychiatric/Behavioral: The patient is nervous/anxious.        Objective:   Physical Exam  Constitutional: She is oriented to person, place, and time. She appears well-developed and well-nourished.  HENT:  Head: Normocephalic and atraumatic.  Neck: Normal range of motion. Neck supple.  Cardiovascular: Normal rate and regular rhythm.   Pulmonary/Chest: Effort normal and breath sounds normal.  Musculoskeletal:  Normal Muscle Bulk and Muscle Testing Reveals: Upper Extremities: Full ROM and Muscle Strength 5/5 Thoracic Paraspinal  Tenderness: T-7- T-10 Lumbar Hypersensitivity Lower Extremities: Full ROM and Muscle Strength 5/5 Right Lower Extremity Flexion Produces pain into Patella Patella swelling Noted Arises from chair with ease/ Using Walker for support Antalgic gait    Neurological: She is alert and oriented to person, place, and time.  Skin: Skin is warm and dry.  Psychiatric: She has a normal mood and affect.  Nursing  note and vitals reviewed.         Assessment & Plan:  1. Chronic low back pain, lumbar spondylosis: RX: MS Contin 15 mg one tablet every 12 hours #60 and Percocet 7.5/325 one tablet twice a day ( Break Through) #60 2. Lumbar Radiculopathy: Continue Gabapentin 2. Endstage OA right knee: S/R Right TKR: Ortho Following  3. Hx of left TKA : Continue to monitor 4. Morbid obesity: Healthy Diet Regime  20 minutes of face to face patient care time was spent during this visit. All questions were encouraged and answered.   Follow up in 1 month

## 2015-04-22 LAB — PMP ALCOHOL METABOLITE (ETG): Ethyl Glucuronide (EtG): NEGATIVE ng/mL

## 2015-04-25 LAB — BENZODIAZEPINES (GC/LC/MS), URINE
ALPRAZOLAMU: 3100 ng/mL (ref <25)
Clonazepam metabolite (GC/LC/MS), ur confirm: NEGATIVE ng/mL (ref <25)
Flurazepam metabolite (GC/LC/MS), ur confirm: NEGATIVE ng/mL (ref <50)
Lorazepam (GC/LC/MS), ur confirm: NEGATIVE ng/mL (ref <50)
Midazolam (GC/LC/MS), ur confirm: NEGATIVE ng/mL (ref <50)
Nordiazepam (GC/LC/MS), ur confirm: NEGATIVE ng/mL (ref <50)
OXAZEPAMU: NEGATIVE ng/mL (ref <50)
TRIAZOLAMU: NEGATIVE ng/mL (ref <50)
Temazepam (GC/LC/MS), ur confirm: NEGATIVE ng/mL (ref <50)

## 2015-04-25 LAB — AMPHETAMINES (GC/LC/MS), URINE
Amphetamine GC/MS Conf: NEGATIVE ng/mL (ref <250)
Methamphetamine Quant, Ur: NEGATIVE ng/mL (ref <250)

## 2015-04-25 LAB — OPIATES/OPIOIDS (LC/MS-MS)
Codeine Urine: NEGATIVE ng/mL (ref <50)
HYDROCODONE: NEGATIVE ng/mL (ref <50)
HYDROMORPHONE: NEGATIVE ng/mL (ref <50)
Morphine Urine: NEGATIVE ng/mL (ref <50)
Norhydrocodone, Ur: NEGATIVE ng/mL (ref <50)
Noroxycodone, Ur: 41326 ng/mL (ref <50)
OXYMORPHONE, URINE: 12648 ng/mL (ref <50)
Oxycodone, ur: 22599 ng/mL (ref <50)

## 2015-04-25 LAB — OXYCODONE, URINE (LC/MS-MS)
Noroxycodone, Ur: 41326 ng/mL (ref <50)
OXYCODONE, UR: 22599 ng/mL (ref <50)
Oxymorphone: 12648 ng/mL (ref <50)

## 2015-04-25 LAB — FENTANYL (GC/LC/MS), URINE
FENTANYL (GC/MS) CONFIRM: NEGATIVE ng/mL (ref <0.5)
NORFENTANYL (GC/MS) CONFIRM: NEGATIVE ng/mL (ref <0.5)

## 2015-04-26 ENCOUNTER — Telehealth: Payer: Self-pay

## 2015-04-26 DIAGNOSIS — Z96651 Presence of right artificial knee joint: Secondary | ICD-10-CM | POA: Diagnosis not present

## 2015-04-26 DIAGNOSIS — R262 Difficulty in walking, not elsewhere classified: Secondary | ICD-10-CM | POA: Diagnosis not present

## 2015-04-26 DIAGNOSIS — M25561 Pain in right knee: Secondary | ICD-10-CM | POA: Diagnosis not present

## 2015-04-26 DIAGNOSIS — M25661 Stiffness of right knee, not elsewhere classified: Secondary | ICD-10-CM | POA: Diagnosis not present

## 2015-04-26 LAB — PRESCRIPTION MONITORING PROFILE (SOLSTAS)
BUPRENORPHINE, URINE: NEGATIVE ng/mL
Barbiturate Screen, Urine: NEGATIVE ng/mL
CANNABINOID SCRN UR: NEGATIVE ng/mL
COCAINE METABOLITES: NEGATIVE ng/mL
Carisoprodol, Urine: NEGATIVE ng/mL
Creatinine, Urine: 265.67 mg/dL (ref >20.0)
ECSTASY: NEGATIVE ng/mL
MEPERIDINE UR: NEGATIVE ng/mL
Methadone Screen, Urine: NEGATIVE ng/mL
NITRITES URINE, INITIAL: NEGATIVE ug/mL
Propoxyphene: NEGATIVE ng/mL
TAPENTADOLUR: NEGATIVE ng/mL
TRAMADOL UR: NEGATIVE ng/mL
ZOLPIDEM, URINE: NEGATIVE ng/mL
pH, Initial: 5.5 pH (ref 4.5–8.9)

## 2015-04-26 NOTE — Telephone Encounter (Signed)
Patient called to educate on Medicare Wellness apt. LVM for the patient to call back to educate and schedule for wellness visit.   

## 2015-04-27 NOTE — Telephone Encounter (Signed)
Call to Mrs. Kozinski at other number; Patient called to educate on Medicare Wellness apt. LVM for the patient to call back to educate and schedule for wellness visit.

## 2015-04-28 DIAGNOSIS — R262 Difficulty in walking, not elsewhere classified: Secondary | ICD-10-CM | POA: Diagnosis not present

## 2015-04-28 DIAGNOSIS — M25561 Pain in right knee: Secondary | ICD-10-CM | POA: Diagnosis not present

## 2015-04-28 DIAGNOSIS — M25661 Stiffness of right knee, not elsewhere classified: Secondary | ICD-10-CM | POA: Diagnosis not present

## 2015-04-28 DIAGNOSIS — Z96651 Presence of right artificial knee joint: Secondary | ICD-10-CM | POA: Diagnosis not present

## 2015-05-01 ENCOUNTER — Other Ambulatory Visit: Payer: Self-pay | Admitting: Internal Medicine

## 2015-05-02 DIAGNOSIS — M25661 Stiffness of right knee, not elsewhere classified: Secondary | ICD-10-CM | POA: Diagnosis not present

## 2015-05-02 DIAGNOSIS — Z96651 Presence of right artificial knee joint: Secondary | ICD-10-CM | POA: Diagnosis not present

## 2015-05-02 DIAGNOSIS — M25561 Pain in right knee: Secondary | ICD-10-CM | POA: Diagnosis not present

## 2015-05-02 DIAGNOSIS — R262 Difficulty in walking, not elsewhere classified: Secondary | ICD-10-CM | POA: Diagnosis not present

## 2015-05-03 NOTE — Progress Notes (Signed)
Urine drug screen for this encounter is consistent for prescribed medication.  She reported (on encounter chief complaint note) that she was out of oxycodone. She reported at urine collection last dose was 04/20/15 test done 04/21/15.  Oxycodone and metabolites are very high levels.

## 2015-05-04 DIAGNOSIS — R262 Difficulty in walking, not elsewhere classified: Secondary | ICD-10-CM | POA: Diagnosis not present

## 2015-05-04 DIAGNOSIS — Z96651 Presence of right artificial knee joint: Secondary | ICD-10-CM | POA: Diagnosis not present

## 2015-05-04 DIAGNOSIS — M25561 Pain in right knee: Secondary | ICD-10-CM | POA: Diagnosis not present

## 2015-05-04 DIAGNOSIS — M25661 Stiffness of right knee, not elsewhere classified: Secondary | ICD-10-CM | POA: Diagnosis not present

## 2015-05-10 DIAGNOSIS — M25661 Stiffness of right knee, not elsewhere classified: Secondary | ICD-10-CM | POA: Diagnosis not present

## 2015-05-10 DIAGNOSIS — Z96651 Presence of right artificial knee joint: Secondary | ICD-10-CM | POA: Diagnosis not present

## 2015-05-10 DIAGNOSIS — M25561 Pain in right knee: Secondary | ICD-10-CM | POA: Diagnosis not present

## 2015-05-10 DIAGNOSIS — R262 Difficulty in walking, not elsewhere classified: Secondary | ICD-10-CM | POA: Diagnosis not present

## 2015-05-16 ENCOUNTER — Ambulatory Visit (INDEPENDENT_AMBULATORY_CARE_PROVIDER_SITE_OTHER): Payer: Commercial Managed Care - HMO | Admitting: Internal Medicine

## 2015-05-16 ENCOUNTER — Other Ambulatory Visit (INDEPENDENT_AMBULATORY_CARE_PROVIDER_SITE_OTHER): Payer: Commercial Managed Care - HMO

## 2015-05-16 ENCOUNTER — Encounter: Payer: Self-pay | Admitting: Internal Medicine

## 2015-05-16 VITALS — BP 124/76 | HR 63 | Temp 97.9°F | Resp 16 | Ht 62.0 in | Wt 194.5 lb

## 2015-05-16 DIAGNOSIS — E785 Hyperlipidemia, unspecified: Secondary | ICD-10-CM | POA: Diagnosis not present

## 2015-05-16 DIAGNOSIS — I10 Essential (primary) hypertension: Secondary | ICD-10-CM

## 2015-05-16 DIAGNOSIS — R739 Hyperglycemia, unspecified: Secondary | ICD-10-CM | POA: Diagnosis not present

## 2015-05-16 DIAGNOSIS — D510 Vitamin B12 deficiency anemia due to intrinsic factor deficiency: Secondary | ICD-10-CM

## 2015-05-16 LAB — CBC WITH DIFFERENTIAL/PLATELET
BASOS ABS: 0 10*3/uL (ref 0.0–0.1)
Basophils Relative: 0.6 % (ref 0.0–3.0)
Eosinophils Absolute: 0.2 10*3/uL (ref 0.0–0.7)
Eosinophils Relative: 3.2 % (ref 0.0–5.0)
HEMATOCRIT: 42.3 % (ref 36.0–46.0)
Hemoglobin: 13.5 g/dL (ref 12.0–15.0)
Lymphocytes Relative: 36.4 % (ref 12.0–46.0)
Lymphs Abs: 2.3 10*3/uL (ref 0.7–4.0)
MCHC: 32 g/dL (ref 30.0–36.0)
MCV: 91.2 fl (ref 78.0–100.0)
MONOS PCT: 5.4 % (ref 3.0–12.0)
Monocytes Absolute: 0.3 10*3/uL (ref 0.1–1.0)
Neutro Abs: 3.5 10*3/uL (ref 1.4–7.7)
Neutrophils Relative %: 54.4 % (ref 43.0–77.0)
PLATELETS: 149 10*3/uL — AB (ref 150.0–400.0)
RBC: 4.64 Mil/uL (ref 3.87–5.11)
RDW: 14 % (ref 11.5–15.5)
WBC: 6.4 10*3/uL (ref 4.0–10.5)

## 2015-05-16 LAB — BASIC METABOLIC PANEL
BUN: 14 mg/dL (ref 6–23)
CHLORIDE: 105 meq/L (ref 96–112)
CO2: 30 mEq/L (ref 19–32)
Calcium: 9.5 mg/dL (ref 8.4–10.5)
Creatinine, Ser: 0.53 mg/dL (ref 0.40–1.20)
GFR: 142.66 mL/min (ref >60.00)
Glucose, Bld: 88 mg/dL (ref 70–99)
Potassium: 4.2 mEq/L (ref 3.5–5.1)
Sodium: 139 mEq/L (ref 135–145)

## 2015-05-16 LAB — TSH: TSH: 3.45 u[IU]/mL (ref 0.35–4.50)

## 2015-05-16 LAB — IBC PANEL
Iron: 44 ug/dL (ref 42–145)
Saturation Ratios: 13.4 % — ABNORMAL LOW (ref 20.0–50.0)
TRANSFERRIN: 234 mg/dL (ref 212.0–360.0)

## 2015-05-16 LAB — LIPID PANEL
Cholesterol: 190 mg/dL (ref 0–200)
HDL: 56.5 mg/dL (ref >39.00)
LDL Cholesterol: 106 mg/dL — ABNORMAL HIGH (ref 0–99)
NONHDL: 133.5
Total CHOL/HDL Ratio: 3
Triglycerides: 139 mg/dL (ref 0.0–149.0)
VLDL: 27.8 mg/dL (ref 0.0–40.0)

## 2015-05-16 LAB — HEMOGLOBIN A1C: Hgb A1c MFr Bld: 5 % (ref 4.6–6.5)

## 2015-05-16 LAB — VITAMIN B12: Vitamin B-12: 389 pg/mL (ref 211–911)

## 2015-05-16 LAB — FERRITIN: Ferritin: 25.5 ng/mL (ref 10.0–291.0)

## 2015-05-16 LAB — FOLATE: FOLATE: 19.8 ng/mL (ref >5.9)

## 2015-05-16 MED ORDER — GLUCOSE BLOOD VI STRP
ORAL_STRIP | Status: DC
Start: 2015-05-16 — End: 2016-06-26

## 2015-05-16 NOTE — Patient Instructions (Signed)
Anemia, Nonspecific Anemia is a condition in which the concentration of red blood cells or hemoglobin in the blood is below normal. Hemoglobin is a substance in red blood cells that carries oxygen to the tissues of the body. Anemia results in not enough oxygen reaching these tissues.  CAUSES  Common causes of anemia include:   Excessive bleeding. Bleeding may be internal or external. This includes excessive bleeding from periods (in women) or from the intestine.   Poor nutrition.   Chronic kidney, thyroid, and liver disease.  Bone marrow disorders that decrease red blood cell production.  Cancer and treatments for cancer.  HIV, AIDS, and their treatments.  Spleen problems that increase red blood cell destruction.  Blood disorders.  Excess destruction of red blood cells due to infection, medicines, and autoimmune disorders. SIGNS AND SYMPTOMS   Minor weakness.   Dizziness.   Headache.  Palpitations.   Shortness of breath, especially with exercise.   Paleness.  Cold sensitivity.  Indigestion.  Nausea.  Difficulty sleeping.  Difficulty concentrating. Symptoms may occur suddenly or they may develop slowly.  DIAGNOSIS  Additional blood tests are often needed. These help your health care provider determine the best treatment. Your health care provider will check your stool for blood and look for other causes of blood loss.  TREATMENT  Treatment varies depending on the cause of the anemia. Treatment can include:   Supplements of iron, vitamin B12, or folic acid.   Hormone medicines.   A blood transfusion. This may be needed if blood loss is severe.   Hospitalization. This may be needed if there is significant continual blood loss.   Dietary changes.  Spleen removal. HOME CARE INSTRUCTIONS Keep all follow-up appointments. It often takes many weeks to correct anemia, and having your health care provider check on your condition and your response to  treatment is very important. SEEK IMMEDIATE MEDICAL CARE IF:   You develop extreme weakness, shortness of breath, or chest pain.   You become dizzy or have trouble concentrating.  You develop heavy vaginal bleeding.   You develop a rash.   You have bloody or black, tarry stools.   You faint.   You vomit up blood.   You vomit repeatedly.   You have abdominal pain.  You have a fever or persistent symptoms for more than 2-3 days.   You have a fever and your symptoms suddenly get worse.   You are dehydrated.  MAKE SURE YOU:  Understand these instructions.  Will watch your condition.  Will get help right away if you are not doing well or get worse. Document Released: 12/19/2004 Document Revised: 07/14/2013 Document Reviewed: 05/07/2013 Exit[employer] Patient Information 2015 [employer]. This information is not intended to replace advice given to you by your health care provider. Make sure you discuss any questions you have with your health care provider.  

## 2015-05-16 NOTE — Progress Notes (Signed)
Pre visit review using our clinic review tool, if applicable. No additional management support is needed unless otherwise documented below in the visit note. 

## 2015-05-16 NOTE — Progress Notes (Signed)
Subjective:  Patient ID: [name], female    DOB: 07-10-1933  Age: 81 y.o. MRN: [id number]  CC: Hypertension   HPI TRISA CRANOR presents for follow on HTN - she is s/p right TKR and complains of pain but is being treated by pain management. She offer no other complaints.  Outpatient Prescriptions Prior to Visit  Medication Sig Dispense Refill  . ALPRAZolam (XANAX) 1 MG tablet Take 1 tablet (1 mg total) by mouth 3 (three) times daily as needed for anxiety. 90 tablet 5  . aspirin EC 325 MG EC tablet Take 1 tablet (325 mg total) by mouth daily. 30 tablet 0  . calcium carbonate (OS-CAL) 600 MG TABS tablet Take 600 mg by mouth 2 (two) times daily with a meal.    . Cholecalciferol (VITAMIN D3) 1000 UNITS CAPS Take 2 capsules (2,000 Units total) by mouth daily. 60 capsule 11  . conjugated estrogens (PREMARIN) vaginal cream Place 1 Applicatorful vaginally daily. 42.5 g 11  . Eluxadoline (VIBERZI) 75 MG TABS Take 1 tablet by mouth 2 (two) times daily. 60 tablet 5  . gabapentin (NEURONTIN) 300 MG capsule Take 2 capsules (600 mg total) by mouth at bedtime. 60 capsule 3  . isosorbide-hydrALAZINE (BIDIL) 20-37.5 MG per tablet Take 0.5 tablets by mouth 2 (two) times daily. 30 tablet 11  . losartan (COZAAR) 100 MG tablet take 1 tablet by mouth once daily 90 tablet 3  . lubiprostone (AMITIZA) 24 MCG capsule Take 1 capsule (24 mcg total) by mouth 2 (two) times daily with a meal. 180 capsule 3  . meclizine (ANTIVERT) 25 MG tablet Take 25 mg by mouth 3 (three) times daily as needed for dizziness.    Marland Kitchen morphine (MS CONTIN) 15 MG 12 hr tablet Take 1 tablet (15 mg total) by mouth every 12 (twelve) hours. 60 tablet 0  . oxyCODONE-acetaminophen (PERCOCET) 7.5-325 MG per tablet Take 1 tablet by mouth 2 (two) times daily as needed for moderate pain. 60 tablet 0  . phenazopyridine (PYRIDIUM) 100 MG tablet Take 1 tablet (100 mg total) by mouth 3 (three) times daily as needed for pain. 30 tablet 0  . potassium  chloride SA (K-DUR,KLOR-CON) 20 MEQ tablet Take 1 tablet (20 mEq total) by mouth 2 (two) times daily. 180 tablet 1  . pravastatin (PRAVACHOL) 80 MG tablet Take 80 mg by mouth daily.    . promethazine (PHENERGAN) 12.5 MG tablet Take 1 tablet (12.5 mg total) by mouth every 6 (six) hours as needed for nausea. 30 tablet 2  . traZODone (DESYREL) 100 MG tablet Take 2 tablets (200 mg total) by mouth at bedtime. 180 tablet 3   No facility-administered medications prior to visit.    ROS Review of Systems  Constitutional: Negative.  Negative for fever, chills, diaphoresis, appetite change and fatigue.  HENT: Negative.  Negative for trouble swallowing and voice change.   Eyes: Negative.   Respiratory: Negative.  Negative for cough, choking, chest tightness, shortness of breath and stridor.   Cardiovascular: Negative.  Negative for chest pain, palpitations and leg swelling.  Gastrointestinal: Negative.  Negative for nausea, abdominal pain, diarrhea, constipation and blood in stool.  Endocrine: Negative.  Negative for polydipsia, polyphagia and polyuria.  Genitourinary: Negative.  Negative for difficulty urinating.  Musculoskeletal: Positive for back pain and arthralgias. Negative for myalgias, joint swelling, gait problem, neck pain and neck stiffness.  Skin: Negative.  Negative for rash.  Allergic/Immunologic: Negative.   Neurological: Negative.  Negative for dizziness, tremors,  weakness, light-headedness, numbness and headaches.  Hematological: Negative.  Negative for adenopathy. Does not bruise/bleed easily.  Psychiatric/Behavioral: Negative.     Objective:  BP 124/76 mmHg  Pulse 63  Temp(Src) 97.9 F (36.6 C) (Oral)  Resp 16  Ht 5\' 2"  (1.575 m)  Wt 194 lb 8 oz (88.225 kg)  BMI 35.57 kg/m2  SpO2 97%  BP Readings from Last 3 Encounters:  05/16/15 124/76  04/21/15 144/78  03/25/15 115/45    Wt Readings from Last 3 Encounters:  05/16/15 194 lb 8 oz (88.225 kg)  03/22/15 189 lb (85.73  kg)  03/16/15 189 lb (85.73 kg)    Physical Exam  Constitutional: She is oriented to person, place, and time. She appears well-developed and well-nourished. No distress.  HENT:  Head: Normocephalic and atraumatic.  Mouth/Throat: Oropharynx is clear and moist. No oropharyngeal exudate.  Eyes: Conjunctivae are normal. Right eye exhibits no discharge. Left eye exhibits no discharge. No scleral icterus.  Neck: Normal range of motion. Neck supple. No JVD present. No tracheal deviation present. No thyromegaly present.  Cardiovascular: Normal rate, regular rhythm, normal heart sounds and intact distal pulses.  Exam reveals no gallop and no friction rub.   No murmur heard. Pulmonary/Chest: Effort normal and breath sounds normal. No stridor. No respiratory distress. She has no wheezes. She has no rales. She exhibits no tenderness.  Abdominal: Soft. Bowel sounds are normal. She exhibits no distension and no mass. There is no tenderness. There is no rebound and no guarding.  Musculoskeletal: Normal range of motion. She exhibits no edema or tenderness.  Lymphadenopathy:    She has no cervical adenopathy.  Neurological: She is oriented to person, place, and time.  Skin: Skin is warm and dry. No rash noted. She is not diaphoretic. No erythema. No pallor.  Vitals reviewed.   Lab Results  Component Value Date   WBC 6.4 05/16/2015   HGB 13.5 05/16/2015   HCT 42.3 05/16/2015   PLT 149.0* 05/16/2015   GLUCOSE 88 05/16/2015   CHOL 190 05/16/2015   TRIG 139.0 05/16/2015   HDL 56.50 05/16/2015   LDLDIRECT 150.1 04/15/2011   LDLCALC 106* 05/16/2015   ALT 10 03/10/2015   AST 15 03/10/2015   NA 139 05/16/2015   K 4.2 05/16/2015   CL 105 05/16/2015   CREATININE 0.53 05/16/2015   BUN 14 05/16/2015   CO2 30 05/16/2015   TSH 3.45 05/16/2015   INR 1.01 03/10/2015   HGBA1C 5.0 05/16/2015    US Venous Img Lower Unilateral Right  04/13/2015   CLINICAL DATA:  81 year old female with a history of right  leg pain, status post arthroplasty  EXAM: RIGHT LOWER EXTREMITY VENOUS DOPPLER ULTRASOUND  TECHNIQUE: Gray-scale sonography with graded compression, as well as color Doppler and duplex ultrasound were performed to evaluate the lower extremity deep venous systems from the level of the common femoral vein and including the common femoral, femoral, profunda femoral, popliteal and calf veins including the posterior tibial, peroneal and gastrocnemius veins when visible. The superficial great saphenous vein was also interrogated. Spectral Doppler was utilized to evaluate flow at rest and with distal augmentation maneuvers in the common femoral, femoral and popliteal veins.  COMPARISON:  None.  FINDINGS: Contralateral Common Femoral Vein: Respiratory phasicity is normal and symmetric with the symptomatic side. No evidence of thrombus. Normal compressibility.  Common Femoral Vein: No evidence of thrombus. Normal compressibility, respiratory phasicity and response to augmentation.  Saphenofemoral Junction: No evidence of thrombus. Normal compressibility and flow  on color Doppler imaging.  Profunda Femoral Vein: No evidence of thrombus. Normal compressibility and flow on color Doppler imaging.  Femoral Vein: No evidence of thrombus. Normal compressibility, respiratory phasicity and response to augmentation.  Popliteal Vein: No evidence of thrombus. Normal compressibility, respiratory phasicity and response to augmentation.  Calf Veins: No evidence of thrombus. Normal compressibility and flow on color Doppler imaging.  Superficial Great Saphenous Vein: No evidence of thrombus. Normal compressibility and flow on color Doppler imaging.  Other Findings:  None.  IMPRESSION: Sonographic survey of the right lower extremity negative for DVT.  Signed,  Dulcy Fanny. Earleen Newport, DO  [facility]  [facility]   Electronically Signed   By: Corrie Mckusick D.O.   On: 04/13/2015 14:08    Assessment &  Plan:   Atiyana was seen today for hypertension.  Diagnoses and all orders for this visit:  Essential hypertension - her BP is well controlled, lytes and renal function are stable Orders: -     Basic metabolic panel; Future  Pernicious anemia - this has resolved, her vit levels are normal Orders: -     CBC with Differential/Platelet; Future -     Vitamin B12; Future -     Ferritin; Future -     Folate; Future -     IBC panel; Future  Hyperglycemia - blood sugars are better Orders: -     Hemoglobin A1c; Future -     Basic metabolic panel; Future  Hyperlipidemia with target LDL less than 130 - she has achieved her LDL goal Orders: -     Lipid panel; Future -     TSH; Future  Other orders -     glucose blood (ONE TOUCH ULTRA TEST) test strip; Test up to tid   I am having Ms. Leeth start on glucose blood. I am also having her maintain her potassium chloride SA, meclizine, isosorbide-hydrALAZINE, pravastatin, Vitamin D3, traZODone, calcium carbonate, Eluxadoline, ALPRAZolam, phenazopyridine, conjugated estrogens, lubiprostone, gabapentin, aspirin, promethazine, morphine, oxyCODONE-acetaminophen, and losartan.  Meds ordered this encounter  Medications  . glucose blood (ONE TOUCH ULTRA TEST) test strip    Sig: Test up to tid    Dispense:  100 each    Refill:  12     Follow-up: Return in about 4 months (around 09/15/2015).  Scarlette Calico, MD

## 2015-05-17 DIAGNOSIS — M25561 Pain in right knee: Secondary | ICD-10-CM | POA: Diagnosis not present

## 2015-05-17 DIAGNOSIS — Z96651 Presence of right artificial knee joint: Secondary | ICD-10-CM | POA: Diagnosis not present

## 2015-05-17 DIAGNOSIS — R262 Difficulty in walking, not elsewhere classified: Secondary | ICD-10-CM | POA: Diagnosis not present

## 2015-05-17 DIAGNOSIS — M25661 Stiffness of right knee, not elsewhere classified: Secondary | ICD-10-CM | POA: Diagnosis not present

## 2015-05-18 ENCOUNTER — Encounter: Payer: Commercial Managed Care - HMO | Attending: Physical Medicine & Rehabilitation | Admitting: Registered Nurse

## 2015-05-18 ENCOUNTER — Encounter: Payer: Self-pay | Admitting: Registered Nurse

## 2015-05-18 VITALS — BP 192/78 | HR 65 | Resp 16

## 2015-05-18 DIAGNOSIS — M1711 Unilateral primary osteoarthritis, right knee: Secondary | ICD-10-CM | POA: Insufficient documentation

## 2015-05-18 DIAGNOSIS — G894 Chronic pain syndrome: Secondary | ICD-10-CM | POA: Diagnosis not present

## 2015-05-18 DIAGNOSIS — M5440 Lumbago with sciatica, unspecified side: Secondary | ICD-10-CM | POA: Insufficient documentation

## 2015-05-18 DIAGNOSIS — Z96651 Presence of right artificial knee joint: Secondary | ICD-10-CM

## 2015-05-18 DIAGNOSIS — Z5181 Encounter for therapeutic drug level monitoring: Secondary | ICD-10-CM

## 2015-05-18 DIAGNOSIS — M5416 Radiculopathy, lumbar region: Secondary | ICD-10-CM | POA: Insufficient documentation

## 2015-05-18 DIAGNOSIS — Z79899 Other long term (current) drug therapy: Secondary | ICD-10-CM | POA: Insufficient documentation

## 2015-05-18 DIAGNOSIS — M47816 Spondylosis without myelopathy or radiculopathy, lumbar region: Secondary | ICD-10-CM | POA: Diagnosis not present

## 2015-05-18 MED ORDER — OXYCODONE-ACETAMINOPHEN 7.5-325 MG PO TABS: 1.0000 | ORAL_TABLET | Freq: Two times a day (BID) | ORAL | Status: DC | PRN

## 2015-05-18 MED ORDER — MORPHINE SULFATE ER 15 MG PO TBCR: 15.0000 mg | EXTENDED_RELEASE_TABLET | Freq: Two times a day (BID) | ORAL | Status: DC

## 2015-05-18 NOTE — Progress Notes (Signed)
Subjective:    Patient ID: [name], female    DOB: 1932/12/31, 81 y.o.   MRN: [id number]  HPI: [name] is a 81 year old female who returns for follow up for chronic pain and medication refill. She say's her pain is in her lower back radiating into her lower extremities posteriorly into feet..She rates her pain 8. Her current exercise regime she will be attending [employer] weekly, she completed  physical therapy last week.  [name] states : her morphine does not last 12 hours, reviewed her medications and timing of medication. She was started on morphine last month will continue with same regimen will review with Dr. Naaman Plummer next month. She verbalizes understanding.   Arrived to office hypertensive blood pressure re-checked 159/85. [name] received one script, printer issues.  Pain Inventory Average Pain 7 Pain Right Now 7 My pain is sharp, stabbing and aching  In the last 24 hours, has pain interfered with the following? General activity 7 Relation with others 6 Enjoyment of life 0 What TIME of day is your pain at its worst? morning and evening Sleep (in general) NA  Pain is worse with: bending and some activites Pain improves with: medication Relief from Meds: 5  Mobility use a cane how many minutes can you walk? 5-6 do you drive?  yes  Function retired  Neuro/Psych weakness numbness dizziness anxiety  Prior Studies Any changes since last visit?  no  Physicians involved in your care Any changes since last visit?  no   Family History  Problem Relation Age of Onset  . Alcohol abuse Other   . Hypertension Other   . Kidney disease Other   . Mental illness Other   . Cancer Father     prostate cancer  . Heart disease Sister   . Heart disease Brother   . Emphysema Brother    History   Social History  . Marital Status: Widowed    Spouse Name: N/A  . Number of Children: 5  . Years of Education: N/A   Occupational History  . 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 Smoking status: Never Smoker   . Smokeless tobacco: Never Used  . Alcohol Use: No  . Drug Use: No  . Sexual Activity: Not Currently   Other Topics Concern  . None   Social History Narrative   Past Surgical History  Procedure Laterality Date  . Appendectomy    . Cholecystectomy    . Abdominal hysterectomy    . Tonsillectomy    . Revision total knee arthroplasty  2011  . Rotator cuff repair  2010  . Tonsillectomy    . Joint replacement      LEFT KNEE   . Knee arthroscopy      RIGHT  . Shoulder arthroscopy  09/17/2012    Procedure: ARTHROSCOPY SHOULDER;  Surgeon: Sharmon Revere, MD;  Location: [place];  Service: Orthopedics;  Laterality: Left;  . Left and right heart catheterization with coronary angiogram N/A 10/28/2013    Procedure: LEFT AND RIGHT HEART CATHETERIZATION WITH CORONARY ANGIOGRAM;  Surgeon: Blane Ohara, MD;  Location: [facility] CATH LAB;  Service: Cardiovascular;  Laterality: N/A;  . Left heart catheterization with coronary angiogram N/A 11/14/2014    Procedure: LEFT HEART CATHETERIZATION WITH CORONARY ANGIOGRAM;  Surgeon: Josue Hector, MD;  Location: [facility] CATH LAB;  Service: Cardiovascular;  Laterality: N/A;  . Knee arthroplasty Right  03/22/2015    Procedure: COMPUTER ASSISTED TOTAL KNEE ARTHROPLASTY;  Surgeon: Marybelle Killings, MD;  Location: [place];  Service: Orthopedics;  Laterality: Right;   Past Medical History  Diagnosis Date  . Hyperlipidemia   . Hypertension   . Osteoarthritis   . Wears dentures   . Bruises easily   . Diarrhea   . Abdominal pain   . Wears glasses   . Shortness of breath     WITH EXERTION USES 2 L O2 BEDTIME  . Oxygen desaturation during sleep     USES 2 LITERS BEDTIME   VIA CPAP 06/2012 [facility]   . Anxiety   . Depression   . Sleep apnea     CPAP WITH O2 2 LITERS 2013 ([facility])  . Diabetes mellitus     type II  DIET CONTROLLED  . Chronic headaches     HISTORY OF   . Anemia      NOS  . Hepatitis     AGE 30S  . Chronic diastolic CHF (congestive heart failure)     a. 06/2013 EF 65-70%.  . Mild aortic stenosis     a. 06/2013 Echo: EF 65-70%, mod LVH with focal basal hypertrophy, very mild AS.   BP 192/78 mmHg  Pulse 65  Resp 16  SpO2 96%  Opioid Risk Score:   Fall Risk Score: Moderate Fall Risk (6-13 points) (educated and given a handout on fall prevention in the home today)`1  Depression screen PHQ 2/9  Depression screen [facility] 2/9 05/16/2015 03/20/2015 02/10/2015 04/18/2013 04/16/2013 04/05/2013  Decreased Interest 0 2 3 0 0 0  Down, Depressed, Hopeless 0 1 3 0 0 0  PHQ - 2 Score 0 3 6 0 0 0  Altered sleeping - 1 3 - - -  Tired, decreased energy - 3 3 - - -  Change in appetite - 1 0 - - -  Feeling bad or failure about yourself  - 0 1 - - -  Trouble concentrating - 3 1 - - -  Moving slowly or fidgety/restless - 3 1 - - -  Suicidal thoughts - 0 0 - - -  PHQ-9 Score - 14 15 - - -     Review of Systems  Gastrointestinal: Positive for abdominal pain and constipation.  Neurological: Positive for dizziness, weakness and numbness.  Psychiatric/Behavioral: The patient is nervous/anxious.   All other systems reviewed and are negative.      Objective:   Physical Exam  Constitutional: She is oriented to person, place, and time. She appears well-developed and well-nourished.  HENT:  Head: Normocephalic and atraumatic.  Neck: Normal range of motion. Neck supple.  Cardiovascular: Normal rate and regular rhythm.   Pulmonary/Chest: Effort normal and breath sounds normal.  Musculoskeletal:  Normal Muscle Bulk and Muscle Testing Reveals: Upper Extremities: Full ROM and Muscle Strength 5/5 Thoracic Paraspinal Tenderness: T-6- T-8 Lumbar Paraspinal Tenderness: L-3- L-5 Lower Extremities: Full ROM and Muscle strength 5/5 Arises from chair with ease/ Using straight cane for support. Narrow based gait  Neurological: She is alert and oriented to person, place, and time.    Skin: Skin is warm and dry.  Psychiatric: She has a normal mood and affect.  Nursing note and vitals reviewed.         Assessment & Plan:  1. Chronic low back pain, lumbar spondylosis: RX: MS Contin 15 mg one tablet every 12 hours #60 and Percocet 7.5/325 one tablet twice a day ( Break Through) #  60 2. Lumbar Radiculopathy: Continue Gabapentin 2. Endstage OA right knee: S/R Right TKR: Ortho Following  3. Hx of left TKA : Continue to monitor 4. Morbid obesity: Healthy Diet Regime  20 minutes of face to face patient care time was spent during this visit. All questions were encouraged and answered.  Follow up in 1 month

## 2015-06-06 ENCOUNTER — Telehealth: Payer: Self-pay | Admitting: *Deleted

## 2015-06-06 NOTE — Telephone Encounter (Signed)
Numa called asking to speak to Hendrix.  She needs to talk with her about her breakthrough medication. Left message on name identified voicemail that Zella Ball is out of office until Wed 06/07/15 and can return her call then.  If something needed before then please call back.

## 2015-06-08 NOTE — Telephone Encounter (Signed)
I spoke to [name] regarding her pain, she states her pain has increased in intensity. She asked if she could increase her oxycodone to three times a day.  She states she has 13 tablets. You may take three tablets when pain is severe. She verbalizes understanding

## 2015-06-14 ENCOUNTER — Encounter: Payer: Self-pay | Admitting: Physical Medicine & Rehabilitation

## 2015-06-14 ENCOUNTER — Encounter
Payer: Commercial Managed Care - HMO | Attending: Physical Medicine & Rehabilitation | Admitting: Physical Medicine & Rehabilitation

## 2015-06-14 VITALS — BP 186/78 | HR 67 | Resp 14

## 2015-06-14 DIAGNOSIS — M5440 Lumbago with sciatica, unspecified side: Secondary | ICD-10-CM | POA: Diagnosis not present

## 2015-06-14 DIAGNOSIS — Z5181 Encounter for therapeutic drug level monitoring: Secondary | ICD-10-CM | POA: Insufficient documentation

## 2015-06-14 DIAGNOSIS — M1711 Unilateral primary osteoarthritis, right knee: Secondary | ICD-10-CM

## 2015-06-14 DIAGNOSIS — Z79899 Other long term (current) drug therapy: Secondary | ICD-10-CM | POA: Insufficient documentation

## 2015-06-14 DIAGNOSIS — M47816 Spondylosis without myelopathy or radiculopathy, lumbar region: Secondary | ICD-10-CM | POA: Diagnosis not present

## 2015-06-14 DIAGNOSIS — M5416 Radiculopathy, lumbar region: Secondary | ICD-10-CM | POA: Insufficient documentation

## 2015-06-14 DIAGNOSIS — Z96651 Presence of right artificial knee joint: Secondary | ICD-10-CM

## 2015-06-14 MED ORDER — MORPHINE SULFATE ER 30 MG PO TBCR
30.0000 mg | EXTENDED_RELEASE_TABLET | Freq: Two times a day (BID) | ORAL | Status: DC
Start: 2015-06-14 — End: 2015-06-30

## 2015-06-14 MED ORDER — OXYCODONE-ACETAMINOPHEN 7.5-325 MG PO TABS: 1.0000 | ORAL_TABLET | Freq: Three times a day (TID) | ORAL | Status: DC | PRN

## 2015-06-14 NOTE — Patient Instructions (Signed)
WORK ON IMPROVING YOUR WALKING MECHANICS AND POSTURE.  YOUR KNEE AND BALANCING ARE IMPORTANT IN MANAGING YOUR BACK PAIN. REMEMBER TO STRETCH YOUR BACK AND LEGS DAILY.

## 2015-06-14 NOTE — Progress Notes (Signed)
Subjective:    Patient ID: [name], female    DOB: 11/06/1933, 81 y.o.   MRN: [id number]  HPI  [name] is here in follow up of her chronic pain. She states her pain is ongoing. She feels that the MS Contin has helped, but doesn't last long enough. It sometimes gives her a funny taste in her mouth. She feels like she needs an extra pain pill during the day.  Her right knee pain is still present but improving. She is walking with a cane. Her knee ROM is good.   The increased gabapentin helped sleep---she didn't feel groggy in the AM afterwards for the most part  Pain Inventory Average Pain 6 Pain Right Now 10 My pain is stabbing and aching  In the last 24 hours, has pain interfered with the following? General activity 6 Relation with others 0 Enjoyment of life 6 What TIME of day is your pain at its worst? morning, daytime  Sleep (in general) Fair  Pain is worse with: walking and standing Pain improves with: medication Relief from Meds: 5  Mobility walk with assistance use a cane how many minutes can you walk? 10 ability to climb steps?  yes do you drive?  yes Do you have any goals in this area?  yes  Function retired  Neuro/Psych No problems in this area  Prior Studies Any changes since last visit?  no  Physicians involved in your care Any changes since last visit?  no   Family History  Problem Relation Age of Onset  . Alcohol abuse Other   . Hypertension Other   . Kidney disease Other   . Mental illness Other   . Cancer Father     prostate cancer  . Heart disease Sister   . Heart disease Brother   . Emphysema Brother    History   Social History  . Marital Status: Widowed    Spouse Name: N/A  . Number of Children: 5  . Years of Education: N/A   Occupational History  . 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 Smoking status: Never Smoker   . Smokeless tobacco: Never Used  . Alcohol Use: No  . Drug Use: No    . Sexual Activity: Not Currently   Other Topics Concern  . None   Social History Narrative   Past Surgical History  Procedure Laterality Date  . Appendectomy    . Cholecystectomy    . Abdominal hysterectomy    . Tonsillectomy    . Revision total knee arthroplasty  2011  . Rotator cuff repair  2010  . Tonsillectomy    . Joint replacement      LEFT KNEE   . Knee arthroscopy      RIGHT  . Shoulder arthroscopy  09/17/2012    Procedure: ARTHROSCOPY SHOULDER;  Surgeon: Sharmon Revere, MD;  Location: [place];  Service: Orthopedics;  Laterality: Left;  . Left and right heart catheterization with coronary angiogram N/A 10/28/2013    Procedure: LEFT AND RIGHT HEART CATHETERIZATION WITH CORONARY ANGIOGRAM;  Surgeon: Blane Ohara, MD;  Location: [facility] C[facility] LAB;  Service: Cardiovascular;  Laterality: N/A;  . Left heart catheterization with coronary angiogram N/A 11/14/2014    Procedure: LEFT HEART CATHETERIZATION WITH CORONARY ANGIOGRAM;  Surgeon: Josue Hector, MD;  Location: [facility] CATH LAB;  Service: Cardiovascular;  Laterality: N/A;  . Knee arthroplasty Right 03/22/2015  Procedure: COMPUTER ASSISTED TOTAL KNEE ARTHROPLASTY;  Surgeon: Marybelle Killings, MD;  Location: [place];  Service: Orthopedics;  Laterality: Right;   Past Medical History  Diagnosis Date  . Hyperlipidemia   . Hypertension   . Osteoarthritis   . Wears dentures   . Bruises easily   . Diarrhea   . Abdominal pain   . Wears glasses   . Shortness of breath     WITH EXERTION USES 2 L O2 BEDTIME  . Oxygen desaturation during sleep     USES 2 LITERS BEDTIME   VIA CPAP 06/2012 [facility]   . Anxiety   . Depression   . Sleep apnea     CPAP WITH O2 2 LITERS 2013 ([facility])  . Diabetes mellitus     type II  DIET CONTROLLED  . Chronic headaches     HISTORY OF   . Anemia     NOS  . Hepatitis     AGE 30S  . Chronic diastolic CHF (congestive heart failure)     a. 06/2013 EF 65-70%.  . Mild aortic stenosis     a. 06/2013 Echo:  EF 65-70%, mod LVH with focal basal hypertrophy, very mild AS.   BP 186/78 mmHg  Pulse 67  Resp 14  SpO2 96%  Opioid Risk Score:   Fall Risk Score:  `1  Depression screen PHQ 2/9  Depression screen [facility] 2/9 05/16/2015 03/20/2015 02/10/2015 04/18/2013 04/16/2013 04/05/2013  Decreased Interest 0 2 3 0 0 0  Down, Depressed, Hopeless 0 1 3 0 0 0  PHQ - 2 Score 0 3 6 0 0 0  Altered sleeping - 1 3 - - -  Tired, decreased energy - 3 3 - - -  Change in appetite - 1 0 - - -  Feeling bad or failure about yourself  - 0 1 - - -  Trouble concentrating - 3 1 - - -  Moving slowly or fidgety/restless - 3 1 - - -  Suicidal thoughts - 0 0 - - -  PHQ-9 Score - 14 15 - - -     Review of Systems  All other systems reviewed and are negative.      Objective:   Physical Exam  General: Alert and oriented x 3, No apparent distress. obese  HEENT: Head is normocephalic, atraumatic, PERRLA, EOMI, sclera anicteric, oral mucosa pink and moist, dentition intact, ext ear canals clear,  Neck: Supple without JVD or lymphadenopathy  Heart: Reg rate and rhythm. No murmurs rubs or gallops  Chest: CTA bilaterally without wheezes, rales, or rhonchi; no distress  Abdomen: Soft, non-tender, non-distended, bowel sounds positive.  Extremities: No clubbing, cyanosis, or edema. Pulses are 2+  Skin: Clean and intact without signs of breakdown  Neuro: Pt is cognitively appropriate with normal insight, memory, and awareness. Cranial nerves 2-12 are intact. Sensory exam is normal except for diminished LT in both feet. . Reflexes are 1+ in all 4's. Fine motor coordination is intact. No tremors. Motor function is grossly 5/5 in the upper ext with some limitations at the left shoulder. LES grossly 3+HF, RKE 3-, LKE 4 ankles 4/5. Marland Kitchen  Musculoskeletal: right knee incision well healed. Can flex past 90 degrees. Still with antalgia on right. Tends to shift weight to left. Right low back and hip assist with movement of right leg.  Right  lumbar paraspinals are right. Right hemipelvis is ellevated. She still leans slightly to left at rest. Lumbar flexion limited. SLR +  Psych: Pt's affect is appropriate. Pt is cooperative    Assessment & Plan:   1. Chronic low back pain, lumbar spondylosis  2. Endstage OA right knee/ right TKA  3. Hx of left TKA  4. Left RTC disease.  5. Morbid obesity    Plan:  1. Continue gabapentin 600mg  at bed time for radicular pain and sleep.   2. Increase MS contin to 30mg  q12 #60. Continue percocet--changed schedule to 7.5/325 q8 prn #65 3. Utilize ice,heat  4. Reviewed importance of gait mechanics, posture, weight shift. Pool would be good to work on gait/balance. 5. 20 minutes of face to face patient care time were spent during this visit. All questions were encouraged and answered. Follow up with me or NP in about 4 weeks.

## 2015-06-21 ENCOUNTER — Telehealth: Payer: Self-pay | Admitting: *Deleted

## 2015-06-21 NOTE — Telephone Encounter (Signed)
Pt is confused about her oxycodone script, #65 with a sig that states take every 8 hours as needed for pain. She did the math, every 8 hours for pain should equal #90.  Please advise

## 2015-06-21 NOTE — Telephone Encounter (Signed)
The intention is not to use 3 per day but UP TO 3 per day IF NEEDED

## 2015-06-22 NOTE — Telephone Encounter (Signed)
I spoke with [name] and explained that the percocet was only intended to be used for break-through pain as the Morphine is wearing off but not intended to be take around the clock in conjunction the with Morphine.  She says that she did not understand that until she was 8 days into it and she realized she would not have enough.  It looks like she filled it on the day she got the RX which if taken 3 a day she should still have #41 pills.  I explained that with her age there is an increase in risk of falls and that is why she is encouraged not to take more than prescribed.  (Of note on [facility] she gets #90 1 mg Xanax from Dr Ronnald Ramp  In addition to the MS Contin and Percocet.   She does not come back to see Zella Ball until 07/13/15.

## 2015-06-30 ENCOUNTER — Encounter: Payer: Commercial Managed Care - HMO | Admitting: Physical Medicine & Rehabilitation

## 2015-06-30 ENCOUNTER — Encounter: Payer: Commercial Managed Care - HMO | Attending: Physical Medicine & Rehabilitation | Admitting: Registered Nurse

## 2015-06-30 ENCOUNTER — Encounter: Payer: Self-pay | Admitting: Registered Nurse

## 2015-06-30 ENCOUNTER — Telehealth: Payer: Self-pay | Admitting: *Deleted

## 2015-06-30 ENCOUNTER — Other Ambulatory Visit: Payer: Self-pay | Admitting: *Deleted

## 2015-06-30 VITALS — BP 159/79 | HR 64 | Resp 14

## 2015-06-30 DIAGNOSIS — M5416 Radiculopathy, lumbar region: Secondary | ICD-10-CM | POA: Insufficient documentation

## 2015-06-30 DIAGNOSIS — M1711 Unilateral primary osteoarthritis, right knee: Secondary | ICD-10-CM | POA: Diagnosis not present

## 2015-06-30 DIAGNOSIS — M5440 Lumbago with sciatica, unspecified side: Secondary | ICD-10-CM | POA: Diagnosis not present

## 2015-06-30 DIAGNOSIS — M47816 Spondylosis without myelopathy or radiculopathy, lumbar region: Secondary | ICD-10-CM | POA: Diagnosis not present

## 2015-06-30 DIAGNOSIS — Z79899 Other long term (current) drug therapy: Secondary | ICD-10-CM | POA: Diagnosis not present

## 2015-06-30 DIAGNOSIS — Z5181 Encounter for therapeutic drug level monitoring: Secondary | ICD-10-CM | POA: Insufficient documentation

## 2015-06-30 DIAGNOSIS — Z96651 Presence of right artificial knee joint: Secondary | ICD-10-CM

## 2015-06-30 DIAGNOSIS — G894 Chronic pain syndrome: Secondary | ICD-10-CM

## 2015-06-30 MED ORDER — OXYCODONE-ACETAMINOPHEN 10-325 MG PO TABS: 1.0000 | ORAL_TABLET | Freq: Three times a day (TID) | ORAL | Status: DC | PRN

## 2015-06-30 NOTE — Telephone Encounter (Signed)
[name] called and her morphine is making her sick.  She is nauseated and her stomach is sore.  I spoke with Dr Naaman Plummer and we added her to [place] schedule 06/30/15.  She can be switched off the Morphine Sulfate to Fentanyl 25 mcg patch q 3 days #10. Elisa was notified of her appointment.

## 2015-06-30 NOTE — Progress Notes (Addendum)
Subjective:    Patient ID: [name], female    DOB: January 11, 1933, 81 y.o.   MRN: [id number]  HPI: [name] is a 81 year old female who returns for follow up for chronic pain and medication refill. She say's her pain is in her lower back radiating into her lower extremities posteriorly into feet. Also states her pain has intensified in her lower back since she wasn't able to take the Morphine due to side effect such as nausea and itching. Also experienced some gagging. Her Morphine will be discarded today. Dr. Naaman Plummer had recommended starting Fentanyl 25 mcg, she states she dosen't want the Fentanyl  Patch also states she had Fentanyl  in the past and it was ineffective.  She's requesting Percocet 10 mg, I reviewed th [place] she hasn't  been prescribed Percocet 10 mg. Was willing to increase her current  Percocet 7.5 mg to TID. She states she was prescribed the 7.5 mg every 4 hours and her pain was controlled better. Now she feels her pain is out of control. I sent a message to Dr. Naaman Plummer awaiting a response. After speaking with Dr. Naaman Plummer we  will prescribe Percocet 10 mg TID she verbalizes understanding. I asked [name] where her morphine was that was sitting on the sink, she states " [name] placed it in the red container", I asked if this was performed in front of her she states Yes". Also she realizes if her pain is not controlled with the increase in Percocet the next step will be prescribing a long acting narcotic she verbalizes understanding. Also she has been encouraged to increase her activity as tolerated, use heat and ice therapy. She verbalizes understanding.She rates her pain 10. Her current exercise regime is attending [employer] three times a week.  Arrived to office hypertensive 160/100 blood pressure rechecked 159/79.  Pain Inventory Average Pain 8 Pain Right Now 10 My pain is constant, sharp, burning, stabbing, tingling and aching  In the last 24 hours, has pain interfered with  the following? General activity 10 Relation with others 10 Enjoyment of life 10 What TIME of day is your pain at its worst? morning Sleep (in general) Poor  Pain is worse with: walking, bending, standing and some activites Pain improves with: rest and medication Relief from Meds: 5  Mobility walk without assistance use a cane how many minutes can you walk? 5-10 ability to climb steps?  no do you drive?  yes  Function retired  Neuro/Psych tingling depression anxiety  Prior Studies Any changes since last visit?  no  Physicians involved in your care Any changes since last visit?  no   Family History  Problem Relation Age of Onset  . Alcohol abuse Other   . Hypertension Other   . Kidney disease Other   . Mental illness Other   . Cancer Father     prostate cancer  . Heart disease Sister   . Heart disease Brother   . Emphysema Brother    History   Social History  . Marital Status: Widowed    Spouse Name: N/A  . Number of Children: 5  . Years of Education: N/A   Occupational History  . 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 Smoking status: Never Smoker   . Smokeless tobacco: Never Used  . Alcohol Use: No  . Drug Use: No  . Sexual Activity: No   Other Topics  Concern  . None   Social History Narrative   Past Surgical History  Procedure Laterality Date  . Appendectomy    . Cholecystectomy    . Abdominal hysterectomy    . Tonsillectomy    . Revision total knee arthroplasty  2011  . Rotator cuff repair  2010  . Tonsillectomy    . Joint replacement      LEFT KNEE   . Knee arthroscopy      RIGHT  . Shoulder arthroscopy  09/17/2012    Procedure: ARTHROSCOPY SHOULDER;  Surgeon: Sharmon Revere, MD;  Location: [place];  Service: Orthopedics;  Laterality: Left;  . Left and right heart catheterization with coronary angiogram N/A 10/28/2013    Procedure: LEFT AND RIGHT HEART CATHETERIZATION WITH CORONARY ANGIOGRAM;  Surgeon:  Blane Ohara, MD;  Location: [facility] CATH LAB;  Service: Cardiovascular;  Laterality: N/A;  . Left heart catheterization with coronary angiogram N/A 11/14/2014    Procedure: LEFT HEART CATHETERIZATION WITH CORONARY ANGIOGRAM;  Surgeon: Josue Hector, MD;  Location: [facility] CATH LAB;  Service: Cardiovascular;  Laterality: N/A;  . Knee arthroplasty Right 03/22/2015    Procedure: COMPUTER ASSISTED TOTAL KNEE ARTHROPLASTY;  Surgeon: Marybelle Killings, MD;  Location: [place];  Service: Orthopedics;  Laterality: Right;   Past Medical History  Diagnosis Date  . Hyperlipidemia   . Hypertension   . Osteoarthritis   . Wears dentures   . Bruises easily   . Diarrhea   . Abdominal pain   . Wears glasses   . Shortness of breath     WITH EXERTION USES 2 L O2 BEDTIME  . Oxygen desaturation during sleep     USES 2 LITERS BEDTIME   VIA CPAP 06/2012 [facility]   . Anxiety   . Depression   . Sleep apnea     CPAP WITH O2 2 LITERS 2013 ([facility])  . Diabetes mellitus     type II  DIET CONTROLLED  . Chronic headaches     HISTORY OF   . Anemia     NOS  . Hepatitis     AGE 30S  . Chronic diastolic CHF (congestive heart failure)     a. 06/2013 EF 65-70%.  . Mild aortic stenosis     a. 06/2013 Echo: EF 65-70%, mod LVH with focal basal hypertrophy, very mild AS.   BP 160/100 mmHg  Pulse 64  Resp 14  SpO2 94%  Opioid Risk Score:   Fall Risk Score:  `1  Depression screen PHQ 2/9  Depression screen [facility] 2/9 05/16/2015 03/20/2015 02/10/2015 04/18/2013 04/16/2013 04/05/2013  Decreased Interest 0 2 3 0 0 0  Down, Depressed, Hopeless 0 1 3 0 0 0  PHQ - 2 Score 0 3 6 0 0 0  Altered sleeping - 1 3 - - -  Tired, decreased energy - 3 3 - - -  Change in appetite - 1 0 - - -  Feeling bad or failure about yourself  - 0 1 - - -  Trouble concentrating - 3 1 - - -  Moving slowly or fidgety/restless - 3 1 - - -  Suicidal thoughts - 0 0 - - -  PHQ-9 Score - 14 15 - - -     Review of Systems  Constitutional: Negative.     HENT: Negative.   Eyes: Negative.   Respiratory: Negative.   Cardiovascular: Negative.   Gastrointestinal: Positive for nausea.  Endocrine: Negative.   Genitourinary: Negative.  Musculoskeletal: Positive for myalgias, back pain and arthralgias.  Skin: Negative.   Allergic/Immunologic: Negative.   Neurological:       Tingling  Hematological: Negative.   Psychiatric/Behavioral: The patient is nervous/anxious.        Objective:   Physical Exam  Constitutional: She appears well-developed and well-nourished.  HENT:  Head: Normocephalic and atraumatic.  Neck: Normal range of motion. Neck supple.  Cardiovascular: Normal rate and regular rhythm.   Pulmonary/Chest: Effort normal and breath sounds normal.  Musculoskeletal:  Normal Muscle Bulk and Muscle Testing Reveals: Upper Extremities: Full ROM and Muscle Strength 5/5 Lumbar Paraspinal Tenderness: L-3- L-5 Lower Extremities: Full ROM and Muscle Strength 5/5 Right Lower Extremity Flexion Produces pain into Patella Arises from chair   Nursing note and vitals reviewed.         Assessment & Plan:  1. Chronic low back pain, lumbar spondylosis: KZ:[id number] 10/325 mg one tablet every 8 hours as needed for pain #90.   MS Contin discontinued due to side effects. 2. Lumbar Radiculopathy: Continue Gabapentin 2. Endstage OA right knee: S/R Right TKR: Ortho Following  3. Hx of left TKA : Continue to monitor 4. Morbid obesity: Healthy Diet Regime  20 minutes of face to face patient care time was spent during this visit. All questions were encouraged and answered.  Follow up in 1 month

## 2015-07-13 ENCOUNTER — Encounter: Payer: Commercial Managed Care - HMO | Admitting: Registered Nurse

## 2015-07-19 ENCOUNTER — Telehealth: Payer: Self-pay | Admitting: Physical Medicine & Rehabilitation

## 2015-07-19 NOTE — Telephone Encounter (Signed)
Return [name] call, left message to return the call.

## 2015-07-20 ENCOUNTER — Telehealth: Payer: Self-pay | Admitting: Registered Nurse

## 2015-07-20 NOTE — Telephone Encounter (Signed)
On 07/19/2015 return [name] called, computers were down. She sates she's in excruciating back pain looking for relief. I explained I will talk with Dr. Naaman Plummer. Her insurance covers Opana will discuss with Dr. Naaman Plummer and give her a return call she verbalizes understanding.

## 2015-07-21 ENCOUNTER — Encounter (HOSPITAL_BASED_OUTPATIENT_CLINIC_OR_DEPARTMENT_OTHER): Payer: Commercial Managed Care - HMO | Admitting: Registered Nurse

## 2015-07-21 ENCOUNTER — Encounter: Payer: Self-pay | Admitting: Registered Nurse

## 2015-07-21 VITALS — BP 147/65 | HR 60

## 2015-07-21 DIAGNOSIS — Z79899 Other long term (current) drug therapy: Secondary | ICD-10-CM | POA: Diagnosis not present

## 2015-07-21 DIAGNOSIS — Z96651 Presence of right artificial knee joint: Secondary | ICD-10-CM

## 2015-07-21 DIAGNOSIS — M1711 Unilateral primary osteoarthritis, right knee: Secondary | ICD-10-CM | POA: Diagnosis not present

## 2015-07-21 DIAGNOSIS — M47816 Spondylosis without myelopathy or radiculopathy, lumbar region: Secondary | ICD-10-CM | POA: Diagnosis not present

## 2015-07-21 DIAGNOSIS — M5440 Lumbago with sciatica, unspecified side: Secondary | ICD-10-CM | POA: Diagnosis not present

## 2015-07-21 DIAGNOSIS — M5416 Radiculopathy, lumbar region: Secondary | ICD-10-CM | POA: Diagnosis not present

## 2015-07-21 DIAGNOSIS — Z5181 Encounter for therapeutic drug level monitoring: Secondary | ICD-10-CM | POA: Diagnosis not present

## 2015-07-21 DIAGNOSIS — G894 Chronic pain syndrome: Secondary | ICD-10-CM

## 2015-07-21 MED ORDER — OXYCODONE-ACETAMINOPHEN 7.5-325 MG PO TABS: 1.0000 | ORAL_TABLET | Freq: Three times a day (TID) | ORAL | Status: DC | PRN

## 2015-07-21 MED ORDER — OXYMORPHONE HCL ER 10 MG PO TB12: 10.0000 mg | ORAL_TABLET | Freq: Two times a day (BID) | ORAL | Status: DC

## 2015-07-21 NOTE — Progress Notes (Signed)
Subjective:    Patient ID: [name], female    DOB: Nov 16, 1933, 81 y.o.   MRN: [id number]  HPI: [name] is a 81 year old female who returns for follow up for chronic pain and medication refill. She say's her pain is in her mid-lower back radiating into her left lower extremity posteriorly. Ms. Beckum called office this week complaining of excruciating back pain. We will prescribed Opana, spoke with Dr. Naaman Plummer he agrees with plan.  She rates her pain 10. Her current exercise regime is walking.  Pain Inventory Average Pain 9 Pain Right Now na My pain is constant, sharp, stabbing and aching  In the last 24 hours, has pain interfered with the following? General activity 9 Relation with others 9 Enjoyment of life 10 What TIME of day is your pain at its worst? morning, night Sleep (in general) NA  Pain is worse with: walking, bending and standing Pain improves with: na Relief from Meds: na  Mobility use a cane how many minutes can you walk? 74min do you drive?  yes  Function retired I need assistance with the following:  household duties  Neuro/Psych numbness trouble walking anxiety  Prior Studies na  Physicians involved in your care na   Family History  Problem Relation Age of Onset  . Alcohol abuse Other   . Hypertension Other   . Kidney disease Other   . Mental illness Other   . Cancer Father     prostate cancer  . Heart disease Sister   . Heart disease Brother   . Emphysema Brother    Social History   Social History  . Marital Status: Widowed    Spouse Name: N/A  . Number of Children: 5  . Years of Education: N/A   Occupational History  . 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 Smoking status: Never Smoker   . Smokeless tobacco: Never Used  . Alcohol Use: No  . Drug Use: No  . Sexual Activity: No   Other Topics Concern  . None   Social History Narrative   Past Surgical History  Procedure Laterality  Date  . Appendectomy    . Cholecystectomy    . Abdominal hysterectomy    . Tonsillectomy    . Revision total knee arthroplasty  2011  . Rotator cuff repair  2010  . Tonsillectomy    . Joint replacement      LEFT KNEE   . Knee arthroscopy      RIGHT  . Shoulder arthroscopy  09/17/2012    Procedure: ARTHROSCOPY SHOULDER;  Surgeon: Sharmon Revere, MD;  Location: [place];  Service: Orthopedics;  Laterality: Left;  . Left and right heart catheterization with coronary angiogram N/A 10/28/2013    Procedure: LEFT AND RIGHT HEART CATHETERIZATION WITH CORONARY ANGIOGRAM;  Surgeon: Blane Ohara, MD;  Location: [facility] CATH LAB;  Service: Cardiovascular;  Laterality: N/A;  . Left heart catheterization with coronary angiogram N/A 11/14/2014    Procedure: LEFT HEART CATHETERIZATION WITH CORONARY ANGIOGRAM;  Surgeon: Josue Hector, MD;  Location: [facility] C[facility] LAB;  Service: Cardiovascular;  Laterality: N/A;  . Knee arthroplasty Right 03/22/2015    Procedure: COMPUTER ASSISTED TOTAL KNEE ARTHROPLASTY;  Surgeon: Marybelle Killings, MD;  Location: [place];  Service: Orthopedics;  Laterality: Right;   Past Medical History  Diagnosis Date  . Hyperlipidemia   . Hypertension   . Osteoarthritis   .  Wears dentures   . Bruises easily   . Diarrhea   . Abdominal pain   . Wears glasses   . Shortness of breath     WITH EXERTION USES 2 L O2 BEDTIME  . Oxygen desaturation during sleep     USES 2 LITERS BEDTIME   VIA CPAP 06/2012 [facility]   . Anxiety   . Depression   . Sleep apnea     CPAP WITH O2 2 LITERS 2013 ([facility])  . Diabetes mellitus     type II  DIET CONTROLLED  . Chronic headaches     HISTORY OF   . Anemia     NOS  . Hepatitis     AGE 30S  . Chronic diastolic CHF (congestive heart failure)     a. 06/2013 EF 65-70%.  . Mild aortic stenosis     a. 06/2013 Echo: EF 65-70%, mod LVH with focal basal hypertrophy, very mild AS.   BP 153/86 mmHg  Pulse 103  SpO2 95%  Opioid Risk Score:   Fall Risk Score:   `1  Depression screen PHQ 2/9  Depression screen [facility] 2/9 07/21/2015 05/16/2015 03/20/2015 02/10/2015 04/18/2013 04/16/2013 04/05/2013  Decreased Interest 0 0 2 3 0 0 0  Down, Depressed, Hopeless 0 0 1 3 0 0 0  PHQ - 2 Score 0 0 3 6 0 0 0  Altered sleeping - - 1 3 - - -  Tired, decreased energy - - 3 3 - - -  Change in appetite - - 1 0 - - -  Feeling bad or failure about yourself  - - 0 1 - - -  Trouble concentrating - - 3 1 - - -  Moving slowly or fidgety/restless - - 3 1 - - -  Suicidal thoughts - - 0 0 - - -  PHQ-9 Score - - 14 15 - - -     Review of Systems  All other systems reviewed and are negative.      Objective:   Physical Exam  Constitutional: She is oriented to person, place, and time. She appears well-developed and well-nourished.  HENT:  Head: Normocephalic and atraumatic.  Neck: Normal range of motion. Neck supple.  Cardiovascular: Normal rate and regular rhythm.   Pulmonary/Chest: Effort normal and breath sounds normal.  Musculoskeletal:  Normal Muscle Bulk and Muscle Testing Reveals: Upper Extremities: Full ROM and Muscle Strength 5/5 Lumbar Hypersensitivity Lower Extremities: Full ROM and Muscle Strength 5/5 Right Lower Extremity Flexion Produces pain into Patella Arises from chair with ease using straight cane for support Antalgic Gait    Neurological: She is alert and oriented to person, place, and time.  Skin: Skin is warm and dry.  Psychiatric: She has a normal mood and affect.  Nursing note and vitals reviewed.         Assessment & Plan:  1. Chronic low back pain, lumbar spondylosis: RX: Opana 10 mg one tablet every 12 hours and Percocet 7.5/325 mg one tablet every 8 hours as needed for pain #90. 2. Lumbar Radiculopathy: Continue Gabapentin 2. Endstage OA right knee: S/R Right TKR: Ortho Following  3. Hx of left TKA : Continue to monitor 4. Morbid obesity: Healthy Diet Regime  20 minutes of face to face patient care time was spent during  this visit. All questions were encouraged and answered.  Follow up in 1 month

## 2015-07-26 ENCOUNTER — Telehealth: Payer: Self-pay | Admitting: *Deleted

## 2015-07-26 ENCOUNTER — Ambulatory Visit: Payer: Commercial Managed Care - HMO | Admitting: Registered Nurse

## 2015-07-26 NOTE — Telephone Encounter (Signed)
[name] is returning call to [name].  She probably is going to be able to tolerate the new medication but "10 mg isn't going to get it".

## 2015-07-26 NOTE — Telephone Encounter (Signed)
Return Ms. July call. She realizes the Opana will not be changed. She verbalizes understanding. Also instructed to follow up with her PCP regarding her GERD she verbalizes understanding.

## 2015-08-02 ENCOUNTER — Ambulatory Visit (INDEPENDENT_AMBULATORY_CARE_PROVIDER_SITE_OTHER): Payer: Commercial Managed Care - HMO | Admitting: Internal Medicine

## 2015-08-02 ENCOUNTER — Encounter: Payer: Self-pay | Admitting: Internal Medicine

## 2015-08-02 VITALS — BP 150/100 | HR 67 | Temp 98.3°F | Resp 16 | Ht 62.0 in | Wt 192.0 lb

## 2015-08-02 DIAGNOSIS — I10 Essential (primary) hypertension: Secondary | ICD-10-CM | POA: Diagnosis not present

## 2015-08-02 DIAGNOSIS — Z23 Encounter for immunization: Secondary | ICD-10-CM | POA: Diagnosis not present

## 2015-08-02 DIAGNOSIS — M1711 Unilateral primary osteoarthritis, right knee: Secondary | ICD-10-CM

## 2015-08-02 DIAGNOSIS — M5440 Lumbago with sciatica, unspecified side: Secondary | ICD-10-CM

## 2015-08-02 DIAGNOSIS — M5416 Radiculopathy, lumbar region: Secondary | ICD-10-CM | POA: Diagnosis not present

## 2015-08-02 MED ORDER — OXYCODONE-ACETAMINOPHEN 7.5-325 MG PO TABS: 1.0000 | ORAL_TABLET | Freq: Three times a day (TID) | ORAL | Status: DC | PRN

## 2015-08-02 NOTE — Progress Notes (Signed)
Pre visit review using our clinic review tool, if applicable. No additional management support is needed unless otherwise documented below in the visit note. 

## 2015-08-02 NOTE — Patient Instructions (Signed)

## 2015-08-02 NOTE — Progress Notes (Signed)
Subjective:  Patient ID: [name], female    DOB: September 26, 1933  Age: 81 y.o. MRN: [id number]  CC: Hypertension   HPI [name] presents for follow-up. She has seen a pain specialist about chronic low back pain. She was started on Opana. She doesn't like it and wants to stop taking it. She complains that it causes nausea, vomiting and itching. She is also concerned that her blood pressure has not been well controlled. She had a few headaches several weeks ago but that has not persisted.  Outpatient Prescriptions Prior to Visit  Medication Sig Dispense Refill  . ALPRAZolam (XANAX) 1 MG tablet Take 1 tablet (1 mg total) by mouth 3 (three) times daily as needed for anxiety. 90 tablet 5  . aspirin EC 325 MG EC tablet Take 1 tablet (325 mg total) by mouth daily. 30 tablet 0  . calcium carbonate (OS-CAL) 600 MG TABS tablet Take 600 mg by mouth 2 (two) times daily with a meal.    . Cholecalciferol (VITAMIN D3) 1000 UNITS CAPS Take 2 capsules (2,000 Units total) by mouth daily. 60 capsule 11  . conjugated estrogens (PREMARIN) vaginal cream Place 1 Applicatorful vaginally daily. 42.5 g 11  . gabapentin (NEURONTIN) 300 MG capsule Take 2 capsules (600 mg total) by mouth at bedtime. 60 capsule 3  . glucose blood (ONE TOUCH ULTRA TEST) test strip Test up to tid 100 each 12  . isosorbide-hydrALAZINE (BIDIL) 20-37.5 MG per tablet Take 0.5 tablets by mouth 2 (two) times daily. 30 tablet 11  . losartan (COZAAR) 100 MG tablet take 1 tablet by mouth once daily 90 tablet 3  . lubiprostone (AMITIZA) 24 MCG capsule Take 1 capsule (24 mcg total) by mouth 2 (two) times daily with a meal. 180 capsule 3  . meclizine (ANTIVERT) 25 MG tablet Take 25 mg by mouth 3 (three) times daily as needed for dizziness.    . potassium chloride SA (K-DUR,KLOR-CON) 20 MEQ tablet Take 1 tablet (20 mEq total) by mouth 2 (two) times daily. 180 tablet 1  . pravastatin (PRAVACHOL) 80 MG tablet Take 80 mg by mouth daily.    .  promethazine (PHENERGAN) 12.5 MG tablet Take 1 tablet (12.5 mg total) by mouth every 6 (six) hours as needed for nausea. 30 tablet 2  . traZODone (DESYREL) 100 MG tablet Take 2 tablets (200 mg total) by mouth at bedtime. 180 tablet 3  . oxyCODONE-acetaminophen (PERCOCET) 7.5-325 MG per tablet Take 1 tablet by mouth every 8 (eight) hours as needed. 90 tablet 0  . phenazopyridine (PYRIDIUM) 100 MG tablet Take 1 tablet (100 mg total) by mouth 3 (three) times daily as needed for pain. 30 tablet 0  . oxymorphone (OPANA ER) 10 MG 12 hr tablet Take 1 tablet (10 mg total) by mouth every 12 (twelve) hours. (Patient not taking: Reported on 08/02/2015) 60 tablet 0   No facility-administered medications prior to visit.    ROS Review of Systems  Constitutional: Negative.  Negative for fever, chills, diaphoresis, appetite change and fatigue.  HENT: Negative.   Eyes: Negative.   Respiratory: Negative.  Negative for cough, choking, chest tightness, shortness of breath and stridor.   Cardiovascular: Negative.  Negative for chest pain, palpitations and leg swelling.  Gastrointestinal: Negative.  Negative for nausea, vomiting, abdominal pain, diarrhea, constipation and blood in stool.  Endocrine: Negative.   Genitourinary: Negative.   Musculoskeletal: Positive for back pain and arthralgias. Negative for myalgias, joint swelling, gait problem and neck pain.  Skin: Negative.  Negative for rash.  Allergic/Immunologic: Negative.   Neurological: Negative.   Hematological: Negative.  Negative for adenopathy. Does not bruise/bleed easily.  Psychiatric/Behavioral: Positive for sleep disturbance. Negative for suicidal ideas, self-injury, dysphoric mood, decreased concentration and agitation. The patient is nervous/anxious.     Objective:  BP 150/100 mmHg  Pulse 67  Temp(Src) 98.3 F (36.8 C) (Oral)  Resp 16  Ht 5\' 2"  (1.575 m)  Wt 192 lb (87.091 kg)  BMI 35.11 kg/m2  SpO2 97%  BP Readings from Last 3  Encounters:  08/02/15 150/100  07/21/15 147/65  06/30/15 159/79    Wt Readings from Last 3 Encounters:  08/02/15 192 lb (87.091 kg)  05/16/15 194 lb 8 oz (88.225 kg)  03/22/15 189 lb (85.73 kg)    Physical Exam  Constitutional: She is oriented to person, place, and time. No distress.  HENT:  Mouth/Throat: Oropharynx is clear and moist. No oropharyngeal exudate.  Eyes: Conjunctivae are normal. Right eye exhibits no discharge. Left eye exhibits no discharge. No scleral icterus.  Neck: Normal range of motion. Neck supple. No JVD present. No tracheal deviation present. No thyromegaly present.  Cardiovascular: Normal rate, regular rhythm, normal heart sounds and intact distal pulses.  Exam reveals no gallop and no friction rub.   No murmur heard. Pulmonary/Chest: Effort normal and breath sounds normal. No stridor. No respiratory distress. She has no wheezes. She has no rales. She exhibits no tenderness.  Abdominal: Soft. Bowel sounds are normal. She exhibits no distension and no mass. There is no tenderness. There is no rebound and no guarding.  Musculoskeletal: Normal range of motion. She exhibits no edema or tenderness.  Lymphadenopathy:    She has no cervical adenopathy.  Neurological: She is oriented to person, place, and time.  Skin: Skin is warm and dry. No rash noted. She is not diaphoretic. No erythema. No pallor.  Psychiatric: She has a normal mood and affect. Her behavior is normal. Judgment and thought content normal.    Lab Results  Component Value Date   WBC 6.4 05/16/2015   HGB 13.5 05/16/2015   HCT 42.3 05/16/2015   PLT 149.0* 05/16/2015   GLUCOSE 88 05/16/2015   CHOL 190 05/16/2015   TRIG 139.0 05/16/2015   HDL 56.50 05/16/2015   LDLDIRECT 150.1 04/15/2011   LDLCALC 106* 05/16/2015   ALT 10 03/10/2015   AST 15 03/10/2015   NA 139 05/16/2015   K 4.2 05/16/2015   CL 105 05/16/2015   CREATININE 0.53 05/16/2015   BUN 14 05/16/2015   CO2 30 05/16/2015   TSH  3.45 05/16/2015   INR 1.01 03/10/2015   HGBA1C 5.0 05/16/2015    US Venous Img Lower Unilateral Right  04/13/2015   CLINICAL DATA:  81 year old female with a history of right leg pain, status post arthroplasty  EXAM: RIGHT LOWER EXTREMITY VENOUS DOPPLER ULTRASOUND  TECHNIQUE: Gray-scale sonography with graded compression, as well as color Doppler and duplex ultrasound were performed to evaluate the lower extremity deep venous systems from the level of the common femoral vein and including the common femoral, femoral, profunda femoral, popliteal and calf veins including the posterior tibial, peroneal and gastrocnemius veins when visible. The superficial great saphenous vein was also interrogated. Spectral Doppler was utilized to evaluate flow at rest and with distal augmentation maneuvers in the common femoral, femoral and popliteal veins.  COMPARISON:  None.  FINDINGS: Contralateral Common Femoral Vein: Respiratory phasicity is normal and symmetric with the symptomatic side. No evidence  of thrombus. Normal compressibility.  Common Femoral Vein: No evidence of thrombus. Normal compressibility, respiratory phasicity and response to augmentation.  Saphenofemoral Junction: No evidence of thrombus. Normal compressibility and flow on color Doppler imaging.  Profunda Femoral Vein: No evidence of thrombus. Normal compressibility and flow on color Doppler imaging.  Femoral Vein: No evidence of thrombus. Normal compressibility, respiratory phasicity and response to augmentation.  Popliteal Vein: No evidence of thrombus. Normal compressibility, respiratory phasicity and response to augmentation.  Calf Veins: No evidence of thrombus. Normal compressibility and flow on color Doppler imaging.  Superficial Great Saphenous Vein: No evidence of thrombus. Normal compressibility and flow on color Doppler imaging.  Other Findings:  None.  IMPRESSION: Sonographic survey of the right lower extremity negative for DVT.  Signed,   Dulcy Fanny. Earleen Newport, DO  [facility]  [facility]   Electronically Signed   By: Corrie Mckusick D.O.   On: 04/13/2015 14:08    Assessment & Plan:   Akera was seen today for hypertension.  Diagnoses and all orders for this visit:  Essential hypertension- her blood pressure is not well controlled but she is convinced that this is because she is in pain and doesn't feel well. She is not willing to add another blood pressure medication at this time. I will recheck her blood pressure during her next visit and will treat if it remains elevated.  Primary osteoarthritis of right knee- will change Opana to Percocet at her request. -     oxyCODONE-acetaminophen (PERCOCET) 7.5-325 MG per tablet; Take 1 tablet by mouth every 8 (eight) hours as needed.  Lumbar radiculopathy -     oxyCODONE-acetaminophen (PERCOCET) 7.5-325 MG per tablet; Take 1 tablet by mouth every 8 (eight) hours as needed.  Midline low back pain with sciatica, sciatica laterality unspecified- at her request she will discontinue Opana. She has taken Percocet before with no side effects and she wants to return to that. She was restarted on that today. -     oxyCODONE-acetaminophen (PERCOCET) 7.5-325 MG per tablet; Take 1 tablet by mouth every 8 (eight) hours as needed.  Need for influenza vaccination -     Flu vaccine HIGH DOSE PF  I have discontinued Ms. Mcclusky's phenazopyridine and oxymorphone. I am also having her maintain her potassium chloride SA, meclizine, isosorbide-hydrALAZINE, pravastatin, Vitamin D3, traZODone, calcium carbonate, ALPRAZolam, conjugated estrogens, lubiprostone, gabapentin, aspirin, promethazine, losartan, glucose blood, and oxyCODONE-acetaminophen.  Meds ordered this encounter  Medications  . oxyCODONE-acetaminophen (PERCOCET) 7.5-325 MG per tablet    Sig: Take 1 tablet by mouth every 8 (eight) hours as needed.    Dispense:  90 tablet    Refill:  0     Follow-up:  Return in about 4 weeks (around 08/30/2015).  Scarlette Calico, MD

## 2015-08-10 ENCOUNTER — Telehealth: Payer: Self-pay | Admitting: *Deleted

## 2015-08-10 NOTE — Telephone Encounter (Signed)
Receive call from pharmacist she states pt brought script for her Percocet to have fill, but she is at least 8 days early. The last script was written on 8/26, by Danella Sensing, NP for # 47. Pt states she no longer goes to the pain clinic, but pharmacist states that per law they can not fill med this early, it has to be two days, but they are needing reason why pt is filling early...[name]

## 2015-08-10 NOTE — Telephone Encounter (Signed)
I don't know why she is filling the prescription early. Maybe the patient can explain this.

## 2015-08-10 NOTE — Telephone Encounter (Signed)
Called pharmacy spoke with pharmacist gave md response. Pt is already aware that the rx can't be filled. They will be able to fill on 9/23 due to [place] law...[name]

## 2015-08-16 ENCOUNTER — Ambulatory Visit: Payer: Commercial Managed Care - HMO | Admitting: Registered Nurse

## 2015-08-18 ENCOUNTER — Other Ambulatory Visit: Payer: Self-pay

## 2015-08-18 MED ORDER — ISOSORB DINITRATE-HYDRALAZINE 20-37.5 MG PO TABS: 0.5000 | ORAL_TABLET | Freq: Two times a day (BID) | ORAL | Status: DC

## 2015-08-28 ENCOUNTER — Ambulatory Visit: Payer: Commercial Managed Care - HMO | Admitting: Physical Medicine & Rehabilitation

## 2015-08-31 ENCOUNTER — Other Ambulatory Visit (INDEPENDENT_AMBULATORY_CARE_PROVIDER_SITE_OTHER): Payer: Commercial Managed Care - HMO

## 2015-08-31 ENCOUNTER — Encounter: Payer: Self-pay | Admitting: Internal Medicine

## 2015-08-31 ENCOUNTER — Ambulatory Visit (INDEPENDENT_AMBULATORY_CARE_PROVIDER_SITE_OTHER): Payer: Commercial Managed Care - HMO | Admitting: Internal Medicine

## 2015-08-31 VITALS — BP 139/89 | HR 67 | Temp 98.7°F | Wt 197.0 lb

## 2015-08-31 DIAGNOSIS — R739 Hyperglycemia, unspecified: Secondary | ICD-10-CM | POA: Diagnosis not present

## 2015-08-31 DIAGNOSIS — M2550 Pain in unspecified joint: Secondary | ICD-10-CM | POA: Diagnosis not present

## 2015-08-31 LAB — URINALYSIS, ROUTINE W REFLEX MICROSCOPIC
Bilirubin Urine: NEGATIVE
Ketones, ur: NEGATIVE
NITRITE: NEGATIVE
PH: 7.5 (ref 5.0–8.0)
SPECIFIC GRAVITY, URINE: 1.01 (ref 1.000–1.030)
TOTAL PROTEIN, URINE-UPE24: NEGATIVE
URINE GLUCOSE: NEGATIVE
Urobilinogen, UA: 0.2 (ref 0.0–1.0)

## 2015-08-31 LAB — CBC WITH DIFFERENTIAL/PLATELET
BASOS ABS: 0 10*3/uL (ref 0.0–0.1)
Basophils Relative: 0.3 % (ref 0.0–3.0)
EOS PCT: 2.3 % (ref 0.0–5.0)
Eosinophils Absolute: 0.2 10*3/uL (ref 0.0–0.7)
HEMATOCRIT: 42.4 % (ref 36.0–46.0)
Hemoglobin: 13.9 g/dL (ref 12.0–15.0)
LYMPHS ABS: 2.1 10*3/uL (ref 0.7–4.0)
Lymphocytes Relative: 27.6 % (ref 12.0–46.0)
MCHC: 32.8 g/dL (ref 30.0–36.0)
MCV: 91.4 fl (ref 78.0–100.0)
MONOS PCT: 5.4 % (ref 3.0–12.0)
Monocytes Absolute: 0.4 10*3/uL (ref 0.1–1.0)
NEUTROS PCT: 64.4 % (ref 43.0–77.0)
Neutro Abs: 4.9 10*3/uL (ref 1.4–7.7)
Platelets: 169 10*3/uL (ref 150.0–400.0)
RBC: 4.64 Mil/uL (ref 3.87–5.11)
RDW: 14.3 % (ref 11.5–15.5)
WBC: 7.5 10*3/uL (ref 4.0–10.5)

## 2015-08-31 LAB — BASIC METABOLIC PANEL
BUN: 14 mg/dL (ref 6–23)
CO2: 32 meq/L (ref 19–32)
Calcium: 10 mg/dL (ref 8.4–10.5)
Chloride: 104 mEq/L (ref 96–112)
Creatinine, Ser: 0.67 mg/dL (ref 0.40–1.20)
GFR: 108.77 mL/min (ref >60.00)
GLUCOSE: 100 mg/dL — AB (ref 70–99)
POTASSIUM: 4.8 meq/L (ref 3.5–5.1)
SODIUM: 142 meq/L (ref 135–145)

## 2015-08-31 LAB — HEPATIC FUNCTION PANEL
ALBUMIN: 4.5 g/dL (ref 3.5–5.2)
ALK PHOS: 66 U/L (ref 39–117)
ALT: 9 U/L (ref 0–35)
AST: 12 U/L (ref 0–37)
Bilirubin, Direct: 0.2 mg/dL (ref 0.0–0.3)
TOTAL PROTEIN: 7.5 g/dL (ref 6.0–8.3)
Total Bilirubin: 1.1 mg/dL (ref 0.2–1.2)

## 2015-08-31 MED ORDER — ALPRAZOLAM 1 MG PO TABS: 1.0000 mg | ORAL_TABLET | Freq: Three times a day (TID) | ORAL | Status: DC | PRN

## 2015-08-31 MED ORDER — OXYCODONE-ACETAMINOPHEN 10-325 MG PO TABS: 0.5000 | ORAL_TABLET | Freq: Three times a day (TID) | ORAL | Status: DC | PRN

## 2015-08-31 MED ORDER — CARVEDILOL 3.125 MG PO TABS: 3.1250 mg | ORAL_TABLET | Freq: Two times a day (BID) | ORAL | Status: DC

## 2015-08-31 NOTE — Assessment & Plan Note (Signed)
Hold pravastatin Labs

## 2015-08-31 NOTE — Progress Notes (Signed)
Pre visit review using our clinic review tool, if applicable. No additional management support is needed unless otherwise documented below in the visit note. 

## 2015-08-31 NOTE — Assessment & Plan Note (Signed)
CBG 97 

## 2015-08-31 NOTE — Progress Notes (Signed)
Subjective:  Patient ID: [name], female    DOB: 05/11/1933  Age: 81 y.o. MRN: [id number]  CC: No chief complaint on file.   HPI NASHIKA COKER presents for fatigue, achy joints, weakness, abd pain (from Losartan per pt), elevated BP x1+ month, frequent urination. C/o severe LBP: 10 mg Oxy/APAP works better w/o side effects  Outpatient Prescriptions Prior to Visit  Medication Sig Dispense Refill  . aspirin EC 325 MG EC tablet Take 1 tablet (325 mg total) by mouth daily. 30 tablet 0  . calcium carbonate (OS-CAL) 600 MG TABS tablet Take 600 mg by mouth 2 (two) times daily with a meal.    . Cholecalciferol (VITAMIN D3) 1000 UNITS CAPS Take 2 capsules (2,000 Units total) by mouth daily. 60 capsule 11  . conjugated estrogens (PREMARIN) vaginal cream Place 1 Applicatorful vaginally daily. 42.5 g 11  . glucose blood (ONE TOUCH ULTRA TEST) test strip Test up to tid 100 each 12  . isosorbide-hydrALAZINE (BIDIL) 20-37.5 MG per tablet Take 0.5 tablets by mouth 2 (two) times daily. 30 tablet 11  . meclizine (ANTIVERT) 25 MG tablet Take 25 mg by mouth 3 (three) times daily as needed for dizziness.    . pravastatin (PRAVACHOL) 80 MG tablet Take 80 mg by mouth daily.    . traZODone (DESYREL) 100 MG tablet Take 2 tablets (200 mg total) by mouth at bedtime. 180 tablet 3  . ALPRAZolam (XANAX) 1 MG tablet Take 1 tablet (1 mg total) by mouth 3 (three) times daily as needed for anxiety. 90 tablet 5  . gabapentin (NEURONTIN) 300 MG capsule Take 2 capsules (600 mg total) by mouth at bedtime. 60 capsule 3  . lubiprostone (AMITIZA) 24 MCG capsule Take 1 capsule (24 mcg total) by mouth 2 (two) times daily with a meal. 180 capsule 3  . oxyCODONE-acetaminophen (PERCOCET) 7.5-325 MG per tablet Take 1 tablet by mouth every 8 (eight) hours as needed. 90 tablet 0  . potassium chloride SA (K-DUR,KLOR-CON) 20 MEQ tablet Take 1 tablet (20 mEq total) by mouth 2 (two) times daily. 180 tablet 1  . promethazine (PHENERGAN)  12.5 MG tablet Take 1 tablet (12.5 mg total) by mouth every 6 (six) hours as needed for nausea. 30 tablet 2  . losartan (COZAAR) 100 MG tablet take 1 tablet by mouth once daily (Patient not taking: Reported on 08/31/2015) 90 tablet 3   No facility-administered medications prior to visit.    ROS Review of Systems  Constitutional: Positive for fatigue. Negative for chills, activity change, appetite change and unexpected weight change.  HENT: Negative for congestion, mouth sores and sinus pressure.   Eyes: Negative for visual disturbance.  Respiratory: Negative for cough and chest tightness.   Gastrointestinal: Positive for abdominal pain. Negative for nausea and diarrhea.  Genitourinary: Positive for urgency and frequency. Negative for difficulty urinating and vaginal pain.  Musculoskeletal: Positive for arthralgias. Negative for back pain and gait problem.  Skin: Negative for pallor and rash.  Neurological: Negative for dizziness, tremors, weakness, numbness and headaches.  Psychiatric/Behavioral: Negative for confusion and sleep disturbance. The patient is not nervous/anxious.     Objective:  BP 139/89 mmHg  Pulse 67  Temp(Src) 98.7 F (37.1 C) (Oral)  Wt 197 lb (89.359 kg)  SpO2 95%  BP Readings from Last 3 Encounters:  08/31/15 139/89  08/02/15 150/100  07/21/15 147/65    Wt Readings from Last 3 Encounters:  08/31/15 197 lb (89.359 kg)  08/02/15 192 lb (87.091  kg)  05/16/15 194 lb 8 oz (88.225 kg)    Physical Exam  Constitutional: She appears well-developed. No distress.  HENT:  Head: Normocephalic.  Right Ear: External ear normal.  Left Ear: External ear normal.  Nose: Nose normal.  Mouth/Throat: Oropharynx is clear and moist.  Eyes: Conjunctivae are normal. Pupils are equal, round, and reactive to light. Right eye exhibits no discharge. Left eye exhibits no discharge.  Neck: Normal range of motion. Neck supple. No JVD present. No tracheal deviation present. No  thyromegaly present.  Cardiovascular: Normal rate, regular rhythm and normal heart sounds.   Pulmonary/Chest: No stridor. No respiratory distress. She has no wheezes.  Abdominal: Soft. Bowel sounds are normal. She exhibits no distension and no mass. There is no tenderness. There is no rebound and no guarding.  Musculoskeletal: She exhibits no edema or tenderness.  Lymphadenopathy:    She has no cervical adenopathy.  Neurological: She displays normal reflexes. No cranial nerve deficit. She exhibits normal muscle tone. Coordination abnormal.  Skin: No rash noted. No erythema.  Psychiatric: She has a normal mood and affect. Her behavior is normal. Judgment and thought content normal.  Obese Cane  Lab Results  Component Value Date   WBC 7.5 08/31/2015   HGB 13.9 08/31/2015   HCT 42.4 08/31/2015   PLT 169.0 08/31/2015   GLUCOSE 100* 08/31/2015   CHOL 190 05/16/2015   TRIG 139.0 05/16/2015   HDL 56.50 05/16/2015   LDLDIRECT 150.1 04/15/2011   LDLCALC 106* 05/16/2015   ALT 9 08/31/2015   AST 12 08/31/2015   NA 142 08/31/2015   K 4.8 08/31/2015   CL 104 08/31/2015   CREATININE 0.67 08/31/2015   BUN 14 08/31/2015   CO2 32 08/31/2015   TSH 3.45 05/16/2015   INR 1.01 03/10/2015   HGBA1C 5.0 05/16/2015    US Venous Img Lower Unilateral Right  04/13/2015   CLINICAL DATA:  81 year old female with a history of right leg pain, status post arthroplasty  EXAM: RIGHT LOWER EXTREMITY VENOUS DOPPLER ULTRASOUND  TECHNIQUE: Gray-scale sonography with graded compression, as well as color Doppler and duplex ultrasound were performed to evaluate the lower extremity deep venous systems from the level of the common femoral vein and including the common femoral, femoral, profunda femoral, popliteal and calf veins including the posterior tibial, peroneal and gastrocnemius veins when visible. The superficial great saphenous vein was also interrogated. Spectral Doppler was utilized to evaluate flow at rest  and with distal augmentation maneuvers in the common femoral, femoral and popliteal veins.  COMPARISON:  None.  FINDINGS: Contralateral Common Femoral Vein: Respiratory phasicity is normal and symmetric with the symptomatic side. No evidence of thrombus. Normal compressibility.  Common Femoral Vein: No evidence of thrombus. Normal compressibility, respiratory phasicity and response to augmentation.  Saphenofemoral Junction: No evidence of thrombus. Normal compressibility and flow on color Doppler imaging.  Profunda Femoral Vein: No evidence of thrombus. Normal compressibility and flow on color Doppler imaging.  Femoral Vein: No evidence of thrombus. Normal compressibility, respiratory phasicity and response to augmentation.  Popliteal Vein: No evidence of thrombus. Normal compressibility, respiratory phasicity and response to augmentation.  Calf Veins: No evidence of thrombus. Normal compressibility and flow on color Doppler imaging.  Superficial Great Saphenous Vein: No evidence of thrombus. Normal compressibility and flow on color Doppler imaging.  Other Findings:  None.  IMPRESSION: Sonographic survey of the right lower extremity negative for DVT.  Signed,  Dulcy Fanny. Earleen Newport DO  [facility]  [facility]   Electronically Signed   By: Corrie Mckusick D.O.   On: 04/13/2015 14:08    Assessment & Plan:   Diagnoses and all orders for this visit:  Hyperglycemia -     Urinalysis; Future -     Basic metabolic panel; Future -     CBC with Differential/Platelet; Future -     Hepatic function panel; Future  Arthralgia -     Urinalysis; Future -     Basic metabolic panel; Future -     CBC with Differential/Platelet; Future -     Hepatic function panel; Future  Other orders -     oxyCODONE-acetaminophen (PERCOCET) 10-325 MG tablet; Take 0.5-1 tablets by mouth every 8 (eight) hours as needed for pain. -     ALPRAZolam (XANAX) 1 MG tablet; Take 1 tablet (1 mg  total) by mouth 3 (three) times daily as needed for anxiety. -     carvedilol (COREG) 3.125 MG tablet; Take 1 tablet (3.125 mg total) by mouth 2 (two) times daily with a meal.  I have discontinued Ms. Forrest's potassium chloride SA, lubiprostone, gabapentin, promethazine, losartan, and oxyCODONE-acetaminophen. I am also having her start on oxyCODONE-acetaminophen and carvedilol. Additionally, I am having her maintain her meclizine, pravastatin, Vitamin D3, traZODone, calcium carbonate, conjugated estrogens, aspirin, glucose blood, isosorbide-hydrALAZINE, and ALPRAZolam.  Meds ordered this encounter  Medications  . oxyCODONE-acetaminophen (PERCOCET) 10-325 MG tablet    Sig: Take 0.5-1 tablets by mouth every 8 (eight) hours as needed for pain.    Dispense:  90 tablet    Refill:  0  . ALPRAZolam (XANAX) 1 MG tablet    Sig: Take 1 tablet (1 mg total) by mouth 3 (three) times daily as needed for anxiety.    Dispense:  90 tablet    Refill:  1  . carvedilol (COREG) 3.125 MG tablet    Sig: Take 1 tablet (3.125 mg total) by mouth 2 (two) times daily with a meal.    Dispense:  60 tablet    Refill:  3     Follow-up: Return in about 4 weeks (around 09/28/2015) for a follow-up visit.  Walker Kehr, MD

## 2015-08-31 NOTE — Patient Instructions (Addendum)
- 

## 2015-09-18 ENCOUNTER — Ambulatory Visit: Payer: Commercial Managed Care - HMO | Admitting: Internal Medicine

## 2015-09-27 ENCOUNTER — Other Ambulatory Visit: Payer: Self-pay

## 2015-09-27 DIAGNOSIS — F329 Major depressive disorder, single episode, unspecified: Principal | ICD-10-CM

## 2015-09-27 DIAGNOSIS — F0280 Dementia in other diseases classified elsewhere without behavioral disturbance: Secondary | ICD-10-CM

## 2015-09-27 DIAGNOSIS — F0393 Unspecified dementia, unspecified severity, with mood disturbance: Secondary | ICD-10-CM

## 2015-09-27 DIAGNOSIS — F411 Generalized anxiety disorder: Secondary | ICD-10-CM

## 2015-09-27 MED ORDER — TRAZODONE HCL 100 MG PO TABS: 200.0000 mg | ORAL_TABLET | Freq: Every day | ORAL | Status: DC

## 2015-09-27 NOTE — Telephone Encounter (Signed)
Ok to fill 

## 2015-10-10 ENCOUNTER — Encounter (HOSPITAL_COMMUNITY): Payer: Self-pay | Admitting: Emergency Medicine

## 2015-10-10 ENCOUNTER — Telehealth: Payer: Self-pay | Admitting: Internal Medicine

## 2015-10-10 ENCOUNTER — Emergency Department (HOSPITAL_COMMUNITY)
Admission: EM | Admit: 2015-10-10 | Discharge: 2015-10-10 | Disposition: A | Discharge status: Home / Self Care | Payer: Commercial Managed Care - HMO | Attending: Emergency Medicine | Admitting: Emergency Medicine

## 2015-10-10 DIAGNOSIS — F419 Anxiety disorder, unspecified: Secondary | ICD-10-CM | POA: Insufficient documentation

## 2015-10-10 DIAGNOSIS — Z8719 Personal history of other diseases of the digestive system: Secondary | ICD-10-CM | POA: Diagnosis not present

## 2015-10-10 DIAGNOSIS — Z9889 Other specified postprocedural states: Secondary | ICD-10-CM | POA: Insufficient documentation

## 2015-10-10 DIAGNOSIS — Z862 Personal history of diseases of the blood and blood-forming organs and certain disorders involving the immune mechanism: Secondary | ICD-10-CM | POA: Insufficient documentation

## 2015-10-10 DIAGNOSIS — G8929 Other chronic pain: Secondary | ICD-10-CM | POA: Diagnosis not present

## 2015-10-10 DIAGNOSIS — I5032 Chronic diastolic (congestive) heart failure: Secondary | ICD-10-CM | POA: Insufficient documentation

## 2015-10-10 DIAGNOSIS — Z79899 Other long term (current) drug therapy: Secondary | ICD-10-CM | POA: Insufficient documentation

## 2015-10-10 DIAGNOSIS — R531 Weakness: Secondary | ICD-10-CM | POA: Diagnosis present

## 2015-10-10 DIAGNOSIS — E119 Type 2 diabetes mellitus without complications: Secondary | ICD-10-CM | POA: Diagnosis not present

## 2015-10-10 DIAGNOSIS — G4730 Sleep apnea, unspecified: Secondary | ICD-10-CM | POA: Diagnosis not present

## 2015-10-10 DIAGNOSIS — E785 Hyperlipidemia, unspecified: Secondary | ICD-10-CM | POA: Insufficient documentation

## 2015-10-10 DIAGNOSIS — Z7982 Long term (current) use of aspirin: Secondary | ICD-10-CM | POA: Insufficient documentation

## 2015-10-10 DIAGNOSIS — Z9981 Dependence on supplemental oxygen: Secondary | ICD-10-CM | POA: Insufficient documentation

## 2015-10-10 DIAGNOSIS — I10 Essential (primary) hypertension: Secondary | ICD-10-CM | POA: Diagnosis not present

## 2015-10-10 DIAGNOSIS — R42 Dizziness and giddiness: Secondary | ICD-10-CM | POA: Insufficient documentation

## 2015-10-10 DIAGNOSIS — M1990 Unspecified osteoarthritis, unspecified site: Secondary | ICD-10-CM | POA: Insufficient documentation

## 2015-10-10 DIAGNOSIS — F329 Major depressive disorder, single episode, unspecified: Secondary | ICD-10-CM | POA: Insufficient documentation

## 2015-10-10 LAB — BASIC METABOLIC PANEL
ANION GAP: 3 — AB (ref 5–15)
BUN: 13 mg/dL (ref 6–20)
CALCIUM: 9.5 mg/dL (ref 8.9–10.3)
CO2: 32 mmol/L (ref 22–32)
Chloride: 105 mmol/L (ref 101–111)
Creatinine, Ser: 0.59 mg/dL (ref 0.44–1.00)
GFR calc Af Amer: >60 mL/min (ref >60)
Glucose, Bld: 100 mg/dL — ABNORMAL HIGH (ref 65–99)
POTASSIUM: 4 mmol/L (ref 3.5–5.1)
SODIUM: 140 mmol/L (ref 135–145)

## 2015-10-10 LAB — URINALYSIS, ROUTINE W REFLEX MICROSCOPIC
BILIRUBIN URINE: NEGATIVE
GLUCOSE, UA: NEGATIVE mg/dL
KETONES UR: NEGATIVE mg/dL
Nitrite: NEGATIVE
PH: 8 (ref 5.0–8.0)
Protein, ur: 100 mg/dL — AB
Specific Gravity, Urine: 1.01 (ref 1.005–1.030)

## 2015-10-10 LAB — CBC WITH DIFFERENTIAL/PLATELET
BASOS PCT: 0 %
Basophils Absolute: 0 10*3/uL (ref 0.0–0.1)
EOS PCT: 2 %
Eosinophils Absolute: 0.1 10*3/uL (ref 0.0–0.7)
HEMATOCRIT: 44.1 % (ref 36.0–46.0)
HEMOGLOBIN: 14.1 g/dL (ref 12.0–15.0)
LYMPHS PCT: 35 %
Lymphs Abs: 2.3 10*3/uL (ref 0.7–4.0)
MCH: 30.4 pg (ref 26.0–34.0)
MCHC: 32 g/dL (ref 30.0–36.0)
MCV: 95 fL (ref 78.0–100.0)
MONO ABS: 0.3 10*3/uL (ref 0.1–1.0)
Monocytes Relative: 4 %
NEUTROS ABS: 3.9 10*3/uL (ref 1.7–7.7)
Neutrophils Relative %: 59 %
Platelets: 156 10*3/uL (ref 150–400)
RBC: 4.64 MIL/uL (ref 3.87–5.11)
RDW: 12.9 % (ref 11.5–15.5)
WBC: 6.6 10*3/uL (ref 4.0–10.5)

## 2015-10-10 LAB — URINE MICROSCOPIC-ADD ON

## 2015-10-10 NOTE — ED Notes (Signed)
Pt reports a recent change in her blood pressure medications by her PCP. Pt reports since then she has been feeling weak. Pt reports she woke up today feeling like she could not get out of bed. Took her BP at home and noted it to be in the 200s.

## 2015-10-10 NOTE — ED Notes (Signed)
Pt stable, ambulatory, states understanding of discharge instructions 

## 2015-10-10 NOTE — Telephone Encounter (Signed)
Patient Name: [name] DOB: 14-Aug-1933 Initial Comment Caller states she is really dizzy- BP is 210/108. Sweating. Nurse Assessment Nurse: Ronnald Ramp, RN, [place] Date/Time (Eastern Time): 10/10/2015 1:35:38 PM Confirm and document reason for call. If symptomatic, describe symptoms. ---Caller states her BP is 210/108 and having dizziness and sweating. Has the patient traveled out of the country within the last 30 days? ---Not Applicable Does the patient have any new or worsening symptoms? ---Yes Will a triage be completed? ---Yes Related visit to physician within the last 2 weeks? ---No Does the PT have any chronic conditions? (i.e. diabetes, asthma, etc.) ---Yes List chronic conditions. ---HTN, Diabetes, High Cholesterol Guidelines Guideline Title Affirmed Question Affirmed Notes High Blood Pressure [1] BP # 160 / 100 AND [2] cardiac or neurologic symptoms (e.g., chest pain, difficulty breathing, unsteady gait, blurred vision) Final Disposition User Go to ED Now Ronnald Ramp, RN, [facility] - ED Disagree/Comply: Comply

## 2015-10-10 NOTE — Discharge Instructions (Signed)
Increase Coreg to 6.25 mg twice a day.  New prescription given. Follow-up your primary care doctor.

## 2015-10-10 NOTE — ED Provider Notes (Signed)
CSN: [id number]     Arrival date & time 10/10/15  1405 History   First MD Initiated Contact with Patient 10/10/15 1519     Chief Complaint  Patient presents with  . Weakness  . Hypertension  . Headache     (Consider location/radiation/quality/duration/timing/severity/associated sxs/prior Treatment) HPI..... Dizziness and weakness since blood pressure medication change approximately one month ago. Her blood pressure has been running high. Daughter reports approximately 170/95  has been the average. Coreg 3.125 mg twice a day is her new medication. No gross neurological deficits, confusion, disorientation. Blood pressure today systolic was [id number].   Past Medical History  Diagnosis Date  . Hyperlipidemia   . Hypertension   . Osteoarthritis   . Wears dentures   . Bruises easily   . Diarrhea   . Abdominal pain   . Wears glasses   . Shortness of breath     WITH EXERTION USES 2 L O2 BEDTIME  . Oxygen desaturation during sleep     USES 2 LITERS BEDTIME   VIA CPAP 06/2012 [facility]   . Anxiety   . Depression   . Sleep apnea     CPAP WITH O2 2 LITERS 2013 ([facility])  . Diabetes mellitus     type II  DIET CONTROLLED  . Chronic headaches     HISTORY OF   . Anemia     NOS  . Hepatitis     AGE 30S  . Chronic diastolic CHF (congestive heart failure) ([place])     a. 06/2013 EF 65-70%.  . Mild aortic stenosis     a. 06/2013 Echo: EF 65-70%, mod LVH with focal basal hypertrophy, very mild AS.   Past Surgical History  Procedure Laterality Date  . Appendectomy    . Cholecystectomy    . Abdominal hysterectomy    . Tonsillectomy    . Revision total knee arthroplasty  2011  . Rotator cuff repair  2010  . Tonsillectomy    . Joint replacement      LEFT KNEE   . Knee arthroscopy      RIGHT  . Shoulder arthroscopy  09/17/2012    Procedure: ARTHROSCOPY SHOULDER;  Surgeon: Sharmon Revere, MD;  Location: [place];  Service: Orthopedics;  Laterality: Left;  . Left and right heart catheterization  with coronary angiogram N/A 10/28/2013    Procedure: LEFT AND RIGHT HEART CATHETERIZATION WITH CORONARY ANGIOGRAM;  Surgeon: Blane Ohara, MD;  Location: [facility] CATH LAB;  Service: Cardiovascular;  Laterality: N/A;  . Left heart catheterization with coronary angiogram N/A 11/14/2014    Procedure: LEFT HEART CATHETERIZATION WITH CORONARY ANGIOGRAM;  Surgeon: Josue Hector, MD;  Location: [facility] CATH LAB;  Service: Cardiovascular;  Laterality: N/A;  . Knee arthroplasty Right 03/22/2015    Procedure: COMPUTER ASSISTED TOTAL KNEE ARTHROPLASTY;  Surgeon: Marybelle Killings, MD;  Location: [place];  Service: Orthopedics;  Laterality: Right;   Family History  Problem Relation Age of Onset  . Alcohol abuse Other   . Hypertension Other   . Kidney disease Other   . Mental illness Other   . Cancer Father     prostate cancer  . Heart disease Sister   . Heart disease Brother   . Emphysema Brother    Social History  Substance Use Topics  . Smoking status: Never Smoker   . Smokeless tobacco: Never Used  . Alcohol Use: No   OB History    No data available     Review  of Systems    Allergies  Amlodipine; Enalapril; Losartan potassium; Morphine and related; and Metformin  Home Medications   Prior to Admission medications   Medication Sig Start Date End Date Taking? Authorizing Provider  ALPRAZolam Duanne Moron) 1 MG tablet Take 1 tablet (1 mg total) by mouth 3 (three) times daily as needed for anxiety. 08/31/15  Yes Aleksei Plotnikov V, MD  aspirin EC 325 MG EC tablet Take 1 tablet (325 mg total) by mouth daily. 03/24/15  Yes Lanae Crumbly, PA-C  calcium carbonate (OS-CAL) 600 MG TABS tablet Take 600 mg by mouth 2 (two) times daily with a meal.   Yes Historical Provider, MD  carvedilol (COREG) 3.125 MG tablet Take 1 tablet (3.125 mg total) by mouth 2 (two) times daily with a meal. 08/31/15  Yes Aleksei Plotnikov V, MD  Cholecalciferol (VITAMIN D3) 1000 UNITS CAPS Take 2 capsules (2,000 Units total) by mouth  daily. 05/25/14  Yes [name], MD  conjugated estrogens (PREMARIN) vaginal cream Place 1 Applicatorful vaginally daily. 03/16/15  Yes [name], MD  glucose blood (ONE TOUCH ULTRA TEST) test strip Test up to tid 05/16/15  Yes [name], MD  isosorbide-hydrALAZINE (BIDIL) 20-37.5 MG per tablet Take 0.5 tablets by mouth 2 (two) times daily. 08/18/15  Yes [name], MD  meclizine (ANTIVERT) 25 MG tablet Take 25 mg by mouth 3 (three) times daily as needed for dizziness.   Yes Historical Provider, MD  oxyCODONE-acetaminophen (PERCOCET) 10-325 MG tablet Take 0.5-1 tablets by mouth every 8 (eight) hours as needed for pain. 08/31/15  Yes Aleksei Plotnikov V, MD  pravastatin (PRAVACHOL) 80 MG tablet Take 80 mg by mouth daily.   Yes Historical Provider, MD  traZODone (DESYREL) 100 MG tablet Take 2 tablets (200 mg total) by mouth at bedtime. 09/27/15  Yes [name], MD   BP 165/88 mmHg  Pulse 57  Temp(Src) 97.5 F (36.4 C) (Oral)  Resp 19  Ht 5\' 2"  (1.575 m)  Wt 201 lb (91.173 kg)  BMI 36.75 kg/m2  SpO2 97% Physical Exam  Constitutional: She is oriented to person, place, and time. She appears well-developed and well-nourished.  HENT:  Head: Normocephalic and atraumatic.  Eyes: Conjunctivae and EOM are normal. Pupils are equal, round, and reactive to light.  Neck: Normal range of motion. Neck supple.  Cardiovascular: Normal rate and regular rhythm.   Pulmonary/Chest: Effort normal and breath sounds normal.  Abdominal: Soft. Bowel sounds are normal.  Musculoskeletal: Normal range of motion.  Neurological: She is alert and oriented to person, place, and time.  Skin: Skin is warm and dry.  Psychiatric: She has a normal mood and affect. Her behavior is normal.  Nursing note and vitals reviewed.   ED Course  Procedures (including critical care time) Labs Review Labs Reviewed  BASIC METABOLIC PANEL - Abnormal; Notable for the following:    Glucose, Bld 100 (*)    Anion gap 3  (*)    All other components within normal limits  URINALYSIS, ROUTINE W REFLEX MICROSCOPIC (NOT AT [facility]) - Abnormal; Notable for the following:    APPearance CLOUDY (*)    Hgb urine dipstick SMALL (*)    Protein, ur 100 (*)    Leukocytes, UA SMALL (*)    All other components within normal limits  URINE MICROSCOPIC-ADD ON - Abnormal; Notable for the following:    Squamous Epithelial / LPF 0-5 (*)    Bacteria, UA RARE (*)    All  other components within normal limits  CBC WITH DIFFERENTIAL/PLATELET    Imaging Review No results found. I have personally reviewed and evaluated these images and lab results as part of my medical decision-making.   EKG Interpretation None      MDM   Final diagnoses:  Essential hypertension    Patient is alert and oriented 3. No gross neurological deficits. I recommended increasing her Coreg to 6.25 mg in the a.m. and ultimately 6.25 mg in the p.m. This was discussed with the patient and her daughter.    Nat Christen, MD 10/10/15 [phone]

## 2015-10-16 ENCOUNTER — Encounter: Payer: Self-pay | Admitting: Internal Medicine

## 2015-10-16 ENCOUNTER — Ambulatory Visit (INDEPENDENT_AMBULATORY_CARE_PROVIDER_SITE_OTHER): Payer: Commercial Managed Care - HMO | Admitting: Internal Medicine

## 2015-10-16 VITALS — BP 130/86 | HR 76 | Temp 98.5°F | Resp 20 | Ht 62.0 in | Wt 208.0 lb

## 2015-10-16 DIAGNOSIS — G4700 Insomnia, unspecified: Secondary | ICD-10-CM

## 2015-10-16 DIAGNOSIS — I10 Essential (primary) hypertension: Secondary | ICD-10-CM | POA: Diagnosis not present

## 2015-10-16 DIAGNOSIS — M1711 Unilateral primary osteoarthritis, right knee: Secondary | ICD-10-CM | POA: Diagnosis not present

## 2015-10-16 DIAGNOSIS — M5416 Radiculopathy, lumbar region: Secondary | ICD-10-CM | POA: Diagnosis not present

## 2015-10-16 DIAGNOSIS — G4730 Sleep apnea, unspecified: Secondary | ICD-10-CM

## 2015-10-16 MED ORDER — CARVEDILOL 6.25 MG PO TABS: 6.2500 mg | ORAL_TABLET | Freq: Two times a day (BID) | ORAL | Status: DC

## 2015-10-16 MED ORDER — OXYCODONE-ACETAMINOPHEN 10-325 MG PO TABS: 0.5000 | ORAL_TABLET | Freq: Four times a day (QID) | ORAL | Status: DC | PRN

## 2015-10-16 MED ORDER — SUVOREXANT 20 MG PO TABS: 1.0000 | ORAL_TABLET | Freq: Every day | ORAL | Status: DC

## 2015-10-16 NOTE — Progress Notes (Signed)
Pre visit review using our clinic review tool, if applicable. No additional management support is needed unless otherwise documented below in the visit note. 

## 2015-10-16 NOTE — Progress Notes (Signed)
Subjective:  Patient ID: [name], female    DOB: 29-Dec-1932  Age: 81 y.o. MRN: [id number]  CC: Osteoarthritis; Back Pain; and Hypertension   HPI Menna [facility] presents for a BP check - she was concerned that her BP was not controlled so she doubled up no her carvedilol dose and now has good BP control. She has ongoing concerns about pain but no other complaints.  Outpatient Prescriptions Prior to Visit  Medication Sig Dispense Refill  . ALPRAZolam (XANAX) 1 MG tablet Take 1 tablet (1 mg total) by mouth 3 (three) times daily as needed for anxiety. 90 tablet 1  . aspirin EC 325 MG EC tablet Take 1 tablet (325 mg total) by mouth daily. 30 tablet 0  . calcium carbonate (OS-CAL) 600 MG TABS tablet Take 600 mg by mouth 2 (two) times daily with a meal.    . Cholecalciferol (VITAMIN D3) 1000 UNITS CAPS Take 2 capsules (2,000 Units total) by mouth daily. 60 capsule 11  . conjugated estrogens (PREMARIN) vaginal cream Place 1 Applicatorful vaginally daily. 42.5 g 11  . glucose blood (ONE TOUCH ULTRA TEST) test strip Test up to tid 100 each 12  . isosorbide-hydrALAZINE (BIDIL) 20-37.5 MG per tablet Take 0.5 tablets by mouth 2 (two) times daily. 30 tablet 11  . meclizine (ANTIVERT) 25 MG tablet Take 25 mg by mouth 3 (three) times daily as needed for dizziness.    . pravastatin (PRAVACHOL) 80 MG tablet Take 80 mg by mouth daily.    . traZODone (DESYREL) 100 MG tablet Take 2 tablets (200 mg total) by mouth at bedtime. 180 tablet 3  . oxyCODONE-acetaminophen (PERCOCET) 10-325 MG tablet Take 0.5-1 tablets by mouth every 8 (eight) hours as needed for pain. 90 tablet 0   No facility-administered medications prior to visit.    ROS Review of Systems  Constitutional: Negative.  Negative for fever, chills, diaphoresis, appetite change and fatigue.  HENT: Negative.   Eyes: Negative.  Negative for visual disturbance.  Respiratory: Negative.  Negative for cough, choking, chest tightness, shortness of  breath and stridor.   Cardiovascular: Negative.  Negative for chest pain, palpitations and leg swelling.  Gastrointestinal: Negative.  Negative for nausea, vomiting, abdominal pain, diarrhea, constipation and blood in stool.  Endocrine: Negative.   Genitourinary: Negative.   Musculoskeletal: Positive for back pain and arthralgias. Negative for myalgias, joint swelling and gait problem.  Skin: Negative.  Negative for pallor and rash.  Allergic/Immunologic: Negative.   Neurological: Negative.  Negative for dizziness, seizures, weakness, light-headedness and numbness.  Hematological: Negative.  Negative for adenopathy. Does not bruise/bleed easily.  Psychiatric/Behavioral: Positive for sleep disturbance. Negative for suicidal ideas, dysphoric mood and decreased concentration. The patient is not nervous/anxious.        She has chronic insomnia with DFA and FA, gets about 3.5 hours of sleep per night, trazodone does not help much, she requests a different agent to help.    Objective:  BP 130/86 mmHg  Pulse 76  Temp(Src) 98.5 F (36.9 C) (Oral)  Resp 20  Ht 5\' 2"  (1.575 m)  Wt 208 lb (94.348 kg)  BMI 38.03 kg/m2  SpO2 96%  BP Readings from Last 3 Encounters:  10/16/15 130/86  10/10/15 179/97  08/31/15 139/89    Wt Readings from Last 3 Encounters:  10/16/15 208 lb (94.348 kg)  10/10/15 201 lb (91.173 kg)  08/31/15 197 lb (89.359 kg)    Physical Exam  Constitutional: She is oriented to person, place,  and time. No distress.  HENT:  Mouth/Throat: Oropharynx is clear and moist. No oropharyngeal exudate.  Eyes: Conjunctivae are normal. Right eye exhibits no discharge. Left eye exhibits no discharge. No scleral icterus.  Neck: Normal range of motion. Neck supple. No JVD present. No tracheal deviation present. No thyromegaly present.  Cardiovascular: Normal rate, regular rhythm, normal heart sounds and intact distal pulses.  Exam reveals no gallop and no friction rub.   No murmur  heard. Pulmonary/Chest: Effort normal and breath sounds normal. No stridor. No respiratory distress. She has no wheezes. She has no rales. She exhibits no tenderness.  Abdominal: Soft. Bowel sounds are normal. She exhibits no distension and no mass. There is no tenderness. There is no rebound and no guarding.  Musculoskeletal: Normal range of motion. She exhibits no edema or tenderness.  Lymphadenopathy:    She has no cervical adenopathy.  Neurological: She is oriented to person, place, and time.  Skin: Skin is warm and dry. No rash noted. She is not diaphoretic. No erythema. No pallor.    Lab Results  Component Value Date   WBC 6.6 10/10/2015   HGB 14.1 10/10/2015   HCT 44.1 10/10/2015   PLT 156 10/10/2015   GLUCOSE 100* 10/10/2015   CHOL 190 05/16/2015   TRIG 139.0 05/16/2015   HDL 56.50 05/16/2015   LDLDIRECT 150.1 04/15/2011   LDLCALC 106* 05/16/2015   ALT 9 08/31/2015   AST 12 08/31/2015   NA 140 10/10/2015   K 4.0 10/10/2015   CL 105 10/10/2015   CREATININE 0.59 10/10/2015   BUN 13 10/10/2015   CO2 32 10/10/2015   TSH 3.45 05/16/2015   INR 1.01 03/10/2015   HGBA1C 5.0 05/16/2015    No results found.  Assessment & Plan:   Tiffany was seen today for osteoarthritis, back pain and hypertension.  Diagnoses and all orders for this visit:  Essential hypertension- her BP is well controlled, will increase the carvedilol dose -     carvedilol (COREG) 6.25 MG tablet; Take 1 tablet (6.25 mg total) by mouth 2 (two) times daily with a meal.  Insomnia w/ sleep apnea- she will try belsomra -     Suvorexant (BELSOMRA) 20 MG TABS; Take 1 tablet by mouth at bedtime.  Lumbar radiculopathy -     Discontinue: oxyCODONE-acetaminophen (PERCOCET) 10-325 MG tablet; Take 0.5-1 tablets by mouth every 6 (six) hours as needed for pain. -     Discontinue: oxyCODONE-acetaminophen (PERCOCET) 10-325 MG tablet; Take 0.5-1 tablets by mouth every 6 (six) hours as needed for pain. -      oxyCODONE-acetaminophen (PERCOCET) 10-325 MG tablet; Take 0.5-1 tablets by mouth every 6 (six) hours as needed for pain.  Primary osteoarthritis of right knee -     Discontinue: oxyCODONE-acetaminophen (PERCOCET) 10-325 MG tablet; Take 0.5-1 tablets by mouth every 6 (six) hours as needed for pain. -     Discontinue: oxyCODONE-acetaminophen (PERCOCET) 10-325 MG tablet; Take 0.5-1 tablets by mouth every 6 (six) hours as needed for pain. -     oxyCODONE-acetaminophen (PERCOCET) 10-325 MG tablet; Take 0.5-1 tablets by mouth every 6 (six) hours as needed for pain.  I am having Ms. Brumley start on Suvorexant and carvedilol. I am also having her maintain her meclizine, pravastatin, Vitamin D3, calcium carbonate, conjugated estrogens, aspirin, glucose blood, isosorbide-hydrALAZINE, ALPRAZolam, traZODone, and oxyCODONE-acetaminophen.  Meds ordered this encounter  Medications  . Suvorexant (BELSOMRA) 20 MG TABS    Sig: Take 1 tablet by mouth at bedtime.  Dispense:  30 tablet    Refill:  5  . DISCONTD: oxyCODONE-acetaminophen (PERCOCET) 10-325 MG tablet    Sig: Take 0.5-1 tablets by mouth every 6 (six) hours as needed for pain.    Dispense:  120 tablet    Refill:  0  . DISCONTD: oxyCODONE-acetaminophen (PERCOCET) 10-325 MG tablet    Sig: Take 0.5-1 tablets by mouth every 6 (six) hours as needed for pain.    Dispense:  120 tablet    Refill:  0    Fill on or after 11/15/15  . oxyCODONE-acetaminophen (PERCOCET) 10-325 MG tablet    Sig: Take 0.5-1 tablets by mouth every 6 (six) hours as needed for pain.    Dispense:  120 tablet    Refill:  0    Fill on or after 12/16/15  . carvedilol (COREG) 6.25 MG tablet    Sig: Take 1 tablet (6.25 mg total) by mouth 2 (two) times daily with a meal.    Dispense:  60 tablet    Refill:  11     Follow-up: Return in about 3 months (around 01/16/2016).  Scarlette Calico, MD

## 2015-10-16 NOTE — Patient Instructions (Signed)
Hypertension Hypertension, commonly called high blood pressure, is when the force of blood pumping through your arteries is too strong. Your arteries are the blood vessels that carry blood from your heart throughout your body. A blood pressure reading consists of a higher number over a lower number, such as 110/72. The higher number (systolic) is the pressure inside your arteries when your heart pumps. The lower number (diastolic) is the pressure inside your arteries when your heart relaxes. Ideally you want your blood pressure below 120/80. Hypertension forces your heart to work harder to pump blood. Your arteries may become narrow or stiff. Having untreated or uncontrolled hypertension can cause heart attack, stroke, kidney disease, and other problems. RISK FACTORS Some risk factors for high blood pressure are controllable. Others are not.  Risk factors you cannot control include:   Race. You may be at higher risk if you are African American.  Age. Risk increases with age.  Gender. Men are at higher risk than women before age 45 years. After age 65, women are at higher risk than men. Risk factors you can control include:  Not getting enough exercise or physical activity.  Being overweight.  Getting too much fat, sugar, calories, or salt in your diet.  Drinking too much alcohol. SIGNS AND SYMPTOMS Hypertension does not usually cause signs or symptoms. Extremely high blood pressure (hypertensive crisis) may cause headache, anxiety, shortness of breath, and nosebleed. DIAGNOSIS To check if you have hypertension, your health care provider will measure your blood pressure while you are seated, with your arm held at the level of your heart. It should be measured at least twice using the same arm. Certain conditions can cause a difference in blood pressure between your right and left arms. A blood pressure reading that is higher than normal on one occasion does not mean that you need treatment. If  it is not clear whether you have high blood pressure, you may be asked to return on a different day to have your blood pressure checked again. Or, you may be asked to monitor your blood pressure at home for 1 or more weeks. TREATMENT Treating high blood pressure includes making lifestyle changes and possibly taking medicine. Living a healthy lifestyle can help lower high blood pressure. You may need to change some of your habits. Lifestyle changes may include:  Following the DASH diet. This diet is high in fruits, vegetables, and whole grains. It is low in salt, red meat, and added sugars.  Keep your sodium intake below 2,300 mg per day.  Getting at least 30-45 minutes of aerobic exercise at least 4 times per week.  Losing weight if necessary.  Not smoking.  Limiting alcoholic beverages.  Learning ways to reduce stress. Your health care provider may prescribe medicine if lifestyle changes are not enough to get your blood pressure under control, and if one of the following is true:  You are 18-59 years of age and your systolic blood pressure is above 140.  You are 60 years of age or older, and your systolic blood pressure is above 150.  Your diastolic blood pressure is above 90.  You have diabetes, and your systolic blood pressure is over 140 or your diastolic blood pressure is over 90.  You have kidney disease and your blood pressure is above 140/90.  You have heart disease and your blood pressure is above 140/90. Your personal target blood pressure may vary depending on your medical conditions, your age, and other factors. HOME CARE INSTRUCTIONS    Have your blood pressure rechecked as directed by your health care provider.   Take medicines only as directed by your health care provider. Follow the directions carefully. Blood pressure medicines must be taken as prescribed. The medicine does not work as well when you skip doses. Skipping doses also puts you at risk for  problems.  Do not smoke.   Monitor your blood pressure at home as directed by your health care provider. SEEK MEDICAL CARE IF:   You think you are having a reaction to medicines taken.  You have recurrent headaches or feel dizzy.  You have swelling in your ankles.  You have trouble with your vision. SEEK IMMEDIATE MEDICAL CARE IF:  You develop a severe headache or confusion.  You have unusual weakness, numbness, or feel faint.  You have severe chest or abdominal pain.  You vomit repeatedly.  You have trouble breathing. MAKE SURE YOU:   Understand these instructions.  Will watch your condition.  Will get help right away if you are not doing well or get worse.   This information is not intended to replace advice given to you by your health care provider. Make sure you discuss any questions you have with your health care provider.   Document Released: 11/11/2005 Document Revised: 03/28/2015 Document Reviewed: 09/03/2013 Elsevier Interactive Patient Education 2016 [employer]  

## 2015-10-25 ENCOUNTER — Other Ambulatory Visit: Payer: Self-pay | Admitting: Internal Medicine

## 2015-10-26 NOTE — Telephone Encounter (Signed)
Ok to fill 

## 2015-10-26 NOTE — Telephone Encounter (Signed)
faxed

## 2015-11-06 ENCOUNTER — Ambulatory Visit: Payer: Commercial Managed Care - HMO | Admitting: Internal Medicine

## 2015-12-15 ENCOUNTER — Other Ambulatory Visit: Payer: Self-pay

## 2015-12-15 MED ORDER — MECLIZINE HCL 25 MG PO TABS: 25.0000 mg | ORAL_TABLET | Freq: Three times a day (TID) | ORAL | Status: DC | PRN

## 2015-12-15 NOTE — Telephone Encounter (Signed)
Ok to fill 

## 2015-12-28 ENCOUNTER — Ambulatory Visit: Payer: Commercial Managed Care - HMO | Admitting: Internal Medicine

## 2016-01-17 ENCOUNTER — Encounter: Payer: Self-pay | Admitting: Internal Medicine

## 2016-01-17 ENCOUNTER — Ambulatory Visit (INDEPENDENT_AMBULATORY_CARE_PROVIDER_SITE_OTHER): Payer: Commercial Managed Care - HMO | Admitting: Internal Medicine

## 2016-01-17 VITALS — BP 128/82 | HR 60 | Temp 98.1°F | Resp 16 | Ht 62.0 in | Wt 207.0 lb

## 2016-01-17 DIAGNOSIS — M5416 Radiculopathy, lumbar region: Secondary | ICD-10-CM

## 2016-01-17 DIAGNOSIS — K219 Gastro-esophageal reflux disease without esophagitis: Secondary | ICD-10-CM

## 2016-01-17 DIAGNOSIS — R1310 Dysphagia, unspecified: Secondary | ICD-10-CM | POA: Diagnosis not present

## 2016-01-17 DIAGNOSIS — M1711 Unilateral primary osteoarthritis, right knee: Secondary | ICD-10-CM

## 2016-01-17 DIAGNOSIS — K222 Esophageal obstruction: Secondary | ICD-10-CM

## 2016-01-17 MED ORDER — PROMETHAZINE HCL 12.5 MG PO TABS: 12.5000 mg | ORAL_TABLET | Freq: Four times a day (QID) | ORAL | Status: DC | PRN

## 2016-01-17 MED ORDER — OXYCODONE-ACETAMINOPHEN 10-325 MG PO TABS: 1.0000 | ORAL_TABLET | Freq: Four times a day (QID) | ORAL | Status: DC | PRN

## 2016-01-17 NOTE — Progress Notes (Signed)
Subjective:  Patient ID: [name], female    DOB: May 30, 1933  Age: 82 y.o. MRN: [id number]  CC: Gastroesophageal Reflux and Back Pain   HPI VIRLEE BELLOW presents for a refill on her medication for low back pain but she also complains of trouble swallowing. She tells me that she has a prior history of peptic ulcer disease and for the last few months feels like food and liquid are getting stuck in the lower part of her throat. When she regurgitates she brings up clear fluid. She complains of heartburn but no odynophagia. She rarely experiences nausea when she does she takes a dose of Phenergan, about one time per week and the symptoms resolve. She's had no loss of appetite or weight loss.  Outpatient Prescriptions Prior to Visit  Medication Sig Dispense Refill  . ALPRAZolam (XANAX) 1 MG tablet take 1 tablet by mouth three times a day if needed for anxiety 90 tablet 3  . aspirin EC 325 MG EC tablet Take 1 tablet (325 mg total) by mouth daily. 30 tablet 0  . calcium carbonate (OS-CAL) 600 MG TABS tablet Take 600 mg by mouth 2 (two) times daily with a meal.    . carvedilol (COREG) 6.25 MG tablet Take 1 tablet (6.25 mg total) by mouth 2 (two) times daily with a meal. 60 tablet 11  . Cholecalciferol (VITAMIN D3) 1000 UNITS CAPS Take 2 capsules (2,000 Units total) by mouth daily. 60 capsule 11  . conjugated estrogens (PREMARIN) vaginal cream Place 1 Applicatorful vaginally daily. 42.5 g 11  . glucose blood (ONE TOUCH ULTRA TEST) test strip Test up to tid 100 each 12  . isosorbide-hydrALAZINE (BIDIL) 20-37.5 MG per tablet Take 0.5 tablets by mouth 2 (two) times daily. 30 tablet 11  . meclizine (ANTIVERT) 25 MG tablet Take 1 tablet (25 mg total) by mouth 3 (three) times daily as needed for dizziness. 60 tablet 2  . pravastatin (PRAVACHOL) 80 MG tablet Take 80 mg by mouth daily.    . Suvorexant (BELSOMRA) 20 MG TABS Take 1 tablet by mouth at bedtime. 30 tablet 5  . traZODone (DESYREL) 100 MG tablet  Take 2 tablets (200 mg total) by mouth at bedtime. 180 tablet 3  . oxyCODONE-acetaminophen (PERCOCET) 10-325 MG tablet Take 0.5-1 tablets by mouth every 6 (six) hours as needed for pain. 120 tablet 0   No facility-administered medications prior to visit.    ROS Review of Systems  Constitutional: Negative.  Negative for fever, chills, diaphoresis, appetite change and fatigue.  HENT: Positive for trouble swallowing. Negative for congestion, sinus pressure, sore throat and voice change.   Eyes: Negative.   Respiratory: Negative.  Negative for cough, choking, chest tightness, shortness of breath and stridor.   Cardiovascular: Negative.  Negative for chest pain, palpitations and leg swelling.  Gastrointestinal: Positive for nausea. Negative for vomiting, abdominal pain, diarrhea, constipation and blood in stool.  Endocrine: Negative.   Genitourinary: Negative.   Musculoskeletal: Positive for back pain and arthralgias (knees). Negative for myalgias, joint swelling, gait problem and neck pain.  Skin: Negative.  Negative for rash.  Allergic/Immunologic: Negative.   Neurological: Negative.  Negative for dizziness, tremors, syncope, facial asymmetry, weakness, light-headedness, numbness and headaches.  Hematological: Negative.  Negative for adenopathy. Does not bruise/bleed easily.  Psychiatric/Behavioral: Negative for behavioral problems, confusion, sleep disturbance, dysphoric mood and decreased concentration. The patient is nervous/anxious.     Objective:  BP 128/82 mmHg  Pulse 60  Temp(Src) 98.1 F (36.7  C) (Oral)  Resp 16  Ht 5\' 2"  (1.575 m)  Wt 207 lb (93.895 kg)  BMI 37.85 kg/m2  SpO2 98%  BP Readings from Last 3 Encounters:  01/17/16 128/82  10/16/15 130/86  10/10/15 179/97    Wt Readings from Last 3 Encounters:  01/17/16 207 lb (93.895 kg)  10/16/15 208 lb (94.348 kg)  10/10/15 201 lb (91.173 kg)    Physical Exam  Constitutional: She is oriented to person, place, and  time. No distress.  HENT:  Head: Normocephalic and atraumatic.  Mouth/Throat: Oropharynx is clear and moist. No oropharyngeal exudate.  Eyes: Conjunctivae are normal. Right eye exhibits no discharge. Left eye exhibits no discharge. No scleral icterus.  Neck: Normal range of motion. Neck supple. No JVD present. No tracheal deviation present. No thyromegaly present.  Cardiovascular: Normal rate, regular rhythm, normal heart sounds and intact distal pulses.  Exam reveals no gallop and no friction rub.   No murmur heard. Pulmonary/Chest: Effort normal and breath sounds normal. No stridor. No respiratory distress. She has no wheezes. She has no rales. She exhibits no tenderness.  Abdominal: Soft. Bowel sounds are normal. She exhibits no distension and no mass. There is no tenderness. There is no rebound and no guarding.  Musculoskeletal: Normal range of motion. She exhibits no edema or tenderness.  Lymphadenopathy:    She has no cervical adenopathy.  Neurological: She is oriented to person, place, and time.  Skin: Skin is warm and dry. No rash noted. She is not diaphoretic. No erythema. No pallor.  Vitals reviewed.   Lab Results  Component Value Date   WBC 6.6 10/10/2015   HGB 14.1 10/10/2015   HCT 44.1 10/10/2015   PLT 156 10/10/2015   GLUCOSE 100* 10/10/2015   CHOL 190 05/16/2015   TRIG 139.0 05/16/2015   HDL 56.50 05/16/2015   LDLDIRECT 150.1 04/15/2011   LDLCALC 106* 05/16/2015   ALT 9 08/31/2015   AST 12 08/31/2015   NA 140 10/10/2015   K 4.0 10/10/2015   CL 105 10/10/2015   CREATININE 0.59 10/10/2015   BUN 13 10/10/2015   CO2 32 10/10/2015   TSH 3.45 05/16/2015   INR 1.01 03/10/2015   HGBA1C 5.0 05/16/2015    No results found.  Assessment & Plan:   Sulay was seen today for gastroesophageal reflux and back pain.  Diagnoses and all orders for this visit:  Lumbar radiculopathy -     Discontinue: oxyCODONE-acetaminophen (PERCOCET) 10-325 MG tablet; Take 1 tablet by  mouth every 6 (six) hours as needed for pain. -     Discontinue: oxyCODONE-acetaminophen (PERCOCET) 10-325 MG tablet; Take 1 tablet by mouth every 6 (six) hours as needed for pain. -     Discontinue: oxyCODONE-acetaminophen (PERCOCET) 10-325 MG tablet; Take 1 tablet by mouth every 6 (six) hours as needed for pain. -     oxyCODONE-acetaminophen (PERCOCET) 10-325 MG tablet; Take 1 tablet by mouth every 6 (six) hours as needed for pain.  Primary osteoarthritis of right knee -     Discontinue: oxyCODONE-acetaminophen (PERCOCET) 10-325 MG tablet; Take 1 tablet by mouth every 6 (six) hours as needed for pain. -     Discontinue: oxyCODONE-acetaminophen (PERCOCET) 10-325 MG tablet; Take 1 tablet by mouth every 6 (six) hours as needed for pain. -     Discontinue: oxyCODONE-acetaminophen (PERCOCET) 10-325 MG tablet; Take 1 tablet by mouth every 6 (six) hours as needed for pain. -     oxyCODONE-acetaminophen (PERCOCET) 10-325 MG tablet; Take 1 tablet  by mouth every 6 (six) hours as needed for pain.  GERD with stricture- I have asked her to start a PPI, will cont phenergan as needed -     promethazine (PHENERGAN) 12.5 MG tablet; Take 1 tablet (12.5 mg total) by mouth every 6 (six) hours as needed for nausea. -     Ambulatory referral to Gastroenterology -     omeprazole (PRILOSEC) 40 MG capsule; Take 1 capsule (40 mg total) by mouth daily.  Dysphagia - I have asked her to see GI to consider upper endoscopy to identify/treat UGI pathology -     Ambulatory referral to Gastroenterology   I am having Ms. Gerke start on omeprazole. I am also having her maintain her pravastatin, Vitamin D3, calcium carbonate, conjugated estrogens, aspirin, glucose blood, isosorbide-hydrALAZINE, traZODone, Suvorexant, carvedilol, ALPRAZolam, meclizine, promethazine, and oxyCODONE-acetaminophen.  Meds ordered this encounter  Medications  . DISCONTD: oxyCODONE-acetaminophen (PERCOCET) 10-325 MG tablet    Sig: Take 1 tablet by  mouth every 6 (six) hours as needed for pain.    Dispense:  120 tablet    Refill:  0    Fill on or after 01/16/16  . promethazine (PHENERGAN) 12.5 MG tablet    Sig: Take 1 tablet (12.5 mg total) by mouth every 6 (six) hours as needed for nausea.    Dispense:  30 tablet    Refill:  2  . DISCONTD: oxyCODONE-acetaminophen (PERCOCET) 10-325 MG tablet    Sig: Take 1 tablet by mouth every 6 (six) hours as needed for pain.    Dispense:  120 tablet    Refill:  0    Fill on or after 02/13/16  . DISCONTD: oxyCODONE-acetaminophen (PERCOCET) 10-325 MG tablet    Sig: Take 1 tablet by mouth every 6 (six) hours as needed for pain.    Dispense:  120 tablet    Refill:  0    Fill on or after 03/15/16  . oxyCODONE-acetaminophen (PERCOCET) 10-325 MG tablet    Sig: Take 1 tablet by mouth every 6 (six) hours as needed for pain.    Dispense:  120 tablet    Refill:  0    Fill on or after 04/14/16  . omeprazole (PRILOSEC) 40 MG capsule    Sig: Take 1 capsule (40 mg total) by mouth daily.    Dispense:  30 capsule    Refill:  11     Follow-up: Return in about 4 months (around 05/16/2016).  Scarlette Calico, MD

## 2016-01-17 NOTE — Patient Instructions (Signed)

## 2016-01-17 NOTE — Progress Notes (Signed)
Pre visit review using our clinic review tool, if applicable. No additional management support is needed unless otherwise documented below in the visit note. 

## 2016-01-21 MED ORDER — OMEPRAZOLE 40 MG PO CPDR: 40.0000 mg | DELAYED_RELEASE_CAPSULE | Freq: Every day | ORAL | Status: DC

## 2016-02-12 DIAGNOSIS — R1310 Dysphagia, unspecified: Secondary | ICD-10-CM | POA: Diagnosis not present

## 2016-02-12 DIAGNOSIS — R109 Unspecified abdominal pain: Secondary | ICD-10-CM | POA: Diagnosis not present

## 2016-02-12 DIAGNOSIS — K219 Gastro-esophageal reflux disease without esophagitis: Secondary | ICD-10-CM | POA: Diagnosis not present

## 2016-02-19 ENCOUNTER — Ambulatory Visit: Payer: Commercial Managed Care - HMO | Admitting: Internal Medicine

## 2016-02-20 DIAGNOSIS — R109 Unspecified abdominal pain: Secondary | ICD-10-CM | POA: Diagnosis not present

## 2016-02-26 ENCOUNTER — Telehealth: Payer: Self-pay | Admitting: *Deleted

## 2016-02-26 MED ORDER — ALPRAZOLAM 1 MG PO TABS: ORAL_TABLET | ORAL | Status: DC

## 2016-02-26 NOTE — Telephone Encounter (Signed)
fxd

## 2016-02-26 NOTE — Telephone Encounter (Signed)
Rx written.

## 2016-02-26 NOTE — Telephone Encounter (Signed)
Received call pt states she is needing refills on her alprazolam.../lmb

## 2016-02-29 DIAGNOSIS — A048 Other specified bacterial intestinal infections: Secondary | ICD-10-CM | POA: Diagnosis not present

## 2016-02-29 DIAGNOSIS — K259 Gastric ulcer, unspecified as acute or chronic, without hemorrhage or perforation: Secondary | ICD-10-CM | POA: Diagnosis not present

## 2016-02-29 DIAGNOSIS — Z01818 Encounter for other preprocedural examination: Secondary | ICD-10-CM | POA: Diagnosis not present

## 2016-02-29 DIAGNOSIS — K529 Noninfective gastroenteritis and colitis, unspecified: Secondary | ICD-10-CM | POA: Diagnosis not present

## 2016-03-04 DIAGNOSIS — A048 Other specified bacterial intestinal infections: Secondary | ICD-10-CM | POA: Diagnosis not present

## 2016-03-04 DIAGNOSIS — K2970 Gastritis, unspecified, without bleeding: Secondary | ICD-10-CM | POA: Diagnosis not present

## 2016-03-04 DIAGNOSIS — K5281 Eosinophilic gastritis or gastroenteritis: Secondary | ICD-10-CM | POA: Diagnosis not present

## 2016-03-04 DIAGNOSIS — K2270 Barrett's esophagus without dysplasia: Secondary | ICD-10-CM | POA: Diagnosis not present

## 2016-03-05 DIAGNOSIS — K219 Gastro-esophageal reflux disease without esophagitis: Secondary | ICD-10-CM | POA: Diagnosis not present

## 2016-03-05 DIAGNOSIS — K200 Eosinophilic esophagitis: Secondary | ICD-10-CM | POA: Diagnosis not present

## 2016-03-06 DIAGNOSIS — K259 Gastric ulcer, unspecified as acute or chronic, without hemorrhage or perforation: Secondary | ICD-10-CM | POA: Diagnosis not present

## 2016-03-06 DIAGNOSIS — A048 Other specified bacterial intestinal infections: Secondary | ICD-10-CM | POA: Diagnosis not present

## 2016-03-06 DIAGNOSIS — K200 Eosinophilic esophagitis: Secondary | ICD-10-CM | POA: Diagnosis not present

## 2016-04-03 DIAGNOSIS — K219 Gastro-esophageal reflux disease without esophagitis: Secondary | ICD-10-CM | POA: Diagnosis not present

## 2016-04-03 DIAGNOSIS — R1084 Generalized abdominal pain: Secondary | ICD-10-CM | POA: Diagnosis not present

## 2016-04-03 DIAGNOSIS — K259 Gastric ulcer, unspecified as acute or chronic, without hemorrhage or perforation: Secondary | ICD-10-CM | POA: Diagnosis not present

## 2016-04-15 ENCOUNTER — Encounter: Payer: Self-pay | Admitting: Internal Medicine

## 2016-04-15 ENCOUNTER — Ambulatory Visit (INDEPENDENT_AMBULATORY_CARE_PROVIDER_SITE_OTHER): Payer: Commercial Managed Care - HMO | Admitting: Internal Medicine

## 2016-04-15 VITALS — BP 126/82 | HR 75 | Temp 97.9°F | Resp 12 | Wt 209.8 lb

## 2016-04-15 DIAGNOSIS — E063 Autoimmune thyroiditis: Secondary | ICD-10-CM

## 2016-04-15 DIAGNOSIS — E278 Other specified disorders of adrenal gland: Secondary | ICD-10-CM

## 2016-04-15 DIAGNOSIS — K219 Gastro-esophageal reflux disease without esophagitis: Secondary | ICD-10-CM | POA: Diagnosis not present

## 2016-04-15 DIAGNOSIS — K259 Gastric ulcer, unspecified as acute or chronic, without hemorrhage or perforation: Secondary | ICD-10-CM | POA: Diagnosis not present

## 2016-04-15 LAB — TSH: TSH: 5.39 u[IU]/mL — AB (ref 0.35–4.50)

## 2016-04-15 LAB — POTASSIUM: POTASSIUM: 3.8 meq/L (ref 3.5–5.1)

## 2016-04-15 LAB — T4, FREE: FREE T4: 0.48 ng/dL — AB (ref 0.60–1.60)

## 2016-04-15 LAB — T3, FREE: T3, Free: 2.9 pg/mL (ref 2.3–4.2)

## 2016-04-15 MED ORDER — DEXAMETHASONE 1 MG PO TABS: ORAL_TABLET | ORAL | Status: DC

## 2016-04-15 NOTE — Progress Notes (Signed)
Patient ID: [name], female   DOB: October 22, 1933, 82 y.o.   MRN: [id number]   HPI  [name] is a 82 y.o.-year-old female, returning for f/u for an adrenal incidentaloma, multinodular goiter and Hashimoto's hypothyroidism. Last visit in 10/2014.  Hashimoto's thyroiditis: Reviewed recent TFTs: Lab Results  Component Value Date   TSH 3.45 05/16/2015   TSH 4.03 11/04/2014   TSH 2.66 05/25/2014   TSH 1.79 12/24/2013   TSH 1.06 11/04/2013   FREET4 0.47* 11/04/2014   FREET4 0.61 12/24/2013   FREET4 0.47* 11/04/2013   FREET4 0.65* 10/28/2013   Component     Latest Ref Rng 11/04/2013  Thyroid Peroxidase Antibody     <35.0 IU/mL 1001.0 (H)   Patient also has a multinodular goiter, with small nodules per last ultrasound in 10/2014.  On her angio chest CT, she was found to have enlarged thyroid gland with nonspecific rounded calcification along the isthmus.   She has dysphagia, but this has improved after she started to chew her food better. She did not go to have the barium swallow test that I ordered 2 years ago.  Adrenal incidentaloma:  Pt had 4 CT scans - adrenal mass not changed - will not repeat. - 04/16/2010: CT abd with and w/o ct  - 06/05/2011: CT abd and pelvis with ct - 04/05/2013: CT abd and pelvis w/o ct - 05/20/2014: Ct abd and pelvis with ct - 2.8 x 1.4 cm stable adrenal nodule  She has had HTN x at least 10 years. Today, BP normal. She went to the ED for HTN: 200's  - but missed meds for a period of time then.   No h/o AMI, strokes. She had low K in the past and is on supplements.   She has OSA - on CPAP.  She does not have DM:  Lab Results  Component Value Date   HGBA1C 5.0 05/16/2015   Hormonal w/u was negative for Cushing's sd., primary aldosteronism or pheochromocytoma:  Component     Latest Ref Rng 11/04/2013 11/05/2013  Epinephrine      undetectable   Norepinephrine      370   Dopamine      undetectable   Catecholamines, Total      370    Metanephrine, Free     <=57 pg/mL <25   Normetanephrine, Free     <=148 pg/mL 75   Total Metanephrines-Plasma     <=205 pg/mL 75   PRA LC/MS/MS     0.25 - 5.82 ng/mL/h 3.04   ALDO / PRA Ratio     0.9 - 28.9 Ratio 2.3   ALDOSTERONE      7   Cortisol, Plasma     (DST)  1.6  Dexamethasone, Serum       347   At last visit: Component     Latest Ref Rng 11/04/2014 11/07/2014  Epinephrine      undetectable   Norepinephrine      218   Dopamine      undetectable   Catecholamines, Total      218   Metanephrine, Pl     <=57 pg/mL <25   Normetanephrine, Pl     <=148 pg/mL 68   Total Metanephrines-Plasma     <=205 pg/mL 68   Potassium     3.5 - 5.1 mEq/L 4.5   Cortisol, Plasma     (DST)  2.6   Component     Latest Ref Rng 11/10/2014  PRA LC/MS/MS     0.25 - 5.82 ng/mL/h 0.22 (L)  ALDO / PRA Ratio     0.9 - 28.9 Ratio 59.1 (H)  ALDOSTERONE      13  Potassium     3.5 - 5.3 mEq/L 4.5   There was a drastic change in her PRA and Aldosterone levels since the previous visit. I had a low suspicion that this is due to new primary hyperaldosteronism since this usually takes longer to develop. Rather, PRA could have been suppressed 2/2 increased salt consumption in the day prior to the lab. I askedher to come and have the labs above repeated in ~1 month, but did not return.  No significant neck compression sxs: but she has dysphagia, for which she learned to take smaller bites. I ordered a Ba swallow 2 years ago >> did not have it. She had EGD at [facility] >> ulcer.   ROS: Constitutional: no weight loss/gain, decreased appetite, + fatigue,  + hot flushes, + poor sleep Eyes: no blurry vision, no xerophthalmia ENT: no sore throat, no nodules palpated in throat, + dysphagia/no odynophagia, no hoarseness, + decreased hearing Cardiovascular: no CP/+ SOB/no palpitations/leg swelling Respiratory: no cough/+SOB Gastrointestinal: + N/no V/+ D/+ C Musculoskeletal: no muscle/+ joint  aches Skin: no rashes, + easy bruising Neurological: no tremors/no numbness/tingling/dizziness  I reviewed pt's medications, allergies, PMH, social hx, family hx, and changes were documented in the history of present illness. Otherwise, unchanged from my initial visit note.  PE: BP 126/82 mmHg  Pulse 75  Temp(Src) 97.9 F (36.6 C) (Oral)  Resp 12  Wt 209 lb 12.8 oz (95.165 kg)  SpO2 94% Body mass index is 38.36 kg/(m^2). Wt Readings from Last 3 Encounters:  04/15/16 209 lb 12.8 oz (95.165 kg)  01/17/16 207 lb (93.895 kg)  10/16/15 208 lb (94.348 kg)   Constitutional: overweight, in NAD, walks with cane Eyes: PERRLA, EOMI, no exophthalmos, no lid lag, no stare ENT: moist mucous membranes, no thyromegaly - no nodules felt, no cervical lymphadenopathy Cardiovascular: RRR, No MRG Respiratory: CTA B Gastrointestinal: abdomen soft, NT, ND, BS+ Musculoskeletal: no deformities, strength intact in all 4 Skin: moist, warm, no rashes Neurological: no tremor with outstretched hands, DTR normal in all 4  ASSESSMENT: 1. Adrenal incidentaloma - Pt had 4 CT scans - reviewed images and reports - adrenal mass (39 HU) not changed - will not repeat. - 04/16/2010: CT abd with and w/o ct  - 06/05/2011: CT abd and pelvis with ct - 04/05/2013: CT abd and pelvis w/o ct - 05/20/2014: Ct abd and pelvis with ct - Left adrenal gland is unremarkable. 2.8 x 1.4 cm intermediate density nodule within the right adrenal gland is stable from prior -  Hormonal w/u (10/2013 And 10/2014): negative for Cushing's sd., primary aldosteronism, or pheochromocytoma in 10/2013.  2. Hashimoto's thyroiditis   3. MNG - Thyroid U/S (11/15/2014):  - Right thyroid lobe: 6.5 x 3.6 x 3.5 cm. There is 0.8 x 0.7 x 0.9 cm hypoechoic nodule in the mid right lobe. Remainder the right lobe is inhomogeneous and enlarged. - Left thyroid lobe: 6.6 x 3.8 x 3.6 cm. There is a hyperechoic solid 1.2 x 1.0 x 1.2 cm nodule in the left upper  pole. There is a 4 mm cyst in the mid left lobe. - Isthmus Thickness: Calcified 0.7 cm nodule in the right side of the isthmus which correlates with the calcification on CT scan. No nodules visualized. - Lymphadenopathy None visualized. IMPRESSION: Multinodular  goiter. None of the visualized nodules fit criteria for fine needle aspiration biopsy at this time.   PLAN:  1. Patient with a 2.1 cm R adrenal nodule discovered incidentally during hospitalization for SOB + fluid retention. The nodule is 39 HU on CT. - To differentiate between a functioning and a nonfunctioning adrenal nodule, we ruled out hypersecretion by checking the following tests (all negative in 2014 in 2015) - dexamethasone suppression test to rule out Cushing syndrome (6% of adrenal incidentalomas)  - Plasma fractionated metanephrines and catecholamines to rule out pheochromocytoma (3% of adrenal incidentalomas) - Plasma renin activity and aldosterone level to rule out primary hyperaldosteronism (0.6% of adrenal incidentalomas) - she also had 4 abd CT scans 2011-2015 >> adrenal  mass stable - will plan for:   hormonal testing yearly for 3 years - will get last set of TFTs now. Of note, the guidelines indicate hormonal follow-up for 5 years, but due to age and previous normal levels, will stop after that the current evaluation  No more imaging tests - I will see her back as needed in the future  2. Hashimoto's Hypothyroidism - latest 5x TFT sets normal  3. MNG - reviewed the thyroid U/S report >> nodules small and unlikely to cause compression - we will likely need a repeat U/S if dysphagia worsens - at last visit, we decided to check a Barium swallow and check for internal vs external compression, but she did not have it done yet. We discussed about this again  >> Would like to postpone this for now as her dysphagia is better.    Component     Latest Ref Rng 04/15/2016          TSH     0.35 - 4.50 uIU/mL 5.39 (H)   T4,Free(Direct)     0.60 - 1.60 ng/dL 0.48 (L)  Triiodothyronine,Free,Serum     2.3 - 4.2 pg/mL 2.9  Slightly elevated TSH is slightly low free T4. I would like to repeat these tests in 2 months, and may need a very low dose of levothyroxine if still abnormal.  Component     Latest Ref Rng 04/24/2016  TSH     0.35 - 4.50 uIU/mL 5.05 (H)  T4,Free(Direct)     0.60 - 1.60 ng/dL 0.46 (L)  Cortisol, Plasma      1.7  Normal dexamethasone suppression test, with a cortisol lower than 5. Unfortunately, the TFTs have been drawn along with a cortisol, earlier than 2 months. Will reorder.  The rest of the labs are normal, Except the PRA, which is suppressed, possibly due to high salt diet. Component     Latest Ref Rng 04/15/2016  Epinephrine      37  Norepinephrine      276  Dopamine      REPORT  Catecholamines, Total      313  Metanephrine, Pl     <=57 pg/mL <25  Normetanephrine, Pl     <=148 pg/mL 66  Total Metanephrines-Plasma     <=205 pg/mL 66  PRA LC/MS/MS     0.25 - 5.82 ng/mL/h <0.04 (L)  ALDO / PRA Ratio     0.9 - 28.9 Ratio SEE NOTE  ALDOSTERONE      1  Potassium     3.5 - 5.1 mEq/L 3.8

## 2016-04-15 NOTE — Patient Instructions (Signed)
Please stop at the lab.  Take the Dexamethasone at 11 pm the night before coming for a cortisol level at 8 am.  Please schedule a new appt as needed.

## 2016-04-19 LAB — CATECHOLAMINES, FRACTIONATED, PLASMA
CATECHOLAMINES, TOTAL: 313 pg/mL
Epinephrine: 37 pg/mL
Norepinephrine: 276 pg/mL

## 2016-04-19 LAB — METANEPHRINES, PLASMA
Metanephrine, Free: <25 pg/mL (ref <=57)
Normetanephrine, Free: 66 pg/mL (ref <=148)
TOTAL METANEPHRINES-PLASMA: 66 pg/mL (ref <=205)

## 2016-04-24 ENCOUNTER — Other Ambulatory Visit (INDEPENDENT_AMBULATORY_CARE_PROVIDER_SITE_OTHER): Payer: Commercial Managed Care - HMO

## 2016-04-24 DIAGNOSIS — E278 Other specified disorders of adrenal gland: Secondary | ICD-10-CM | POA: Diagnosis not present

## 2016-04-24 DIAGNOSIS — E063 Autoimmune thyroiditis: Secondary | ICD-10-CM | POA: Diagnosis not present

## 2016-04-24 LAB — CORTISOL: Cortisol, Plasma: 1.7 ug/dL

## 2016-04-24 LAB — T4, FREE: FREE T4: 0.46 ng/dL — AB (ref 0.60–1.60)

## 2016-04-24 LAB — TSH: TSH: 5.05 u[IU]/mL — AB (ref 0.35–4.50)

## 2016-04-25 LAB — ALDOSTERONE + RENIN ACTIVITY W/ RATIO: ALDOSTERONE: 1 ng/dL

## 2016-05-08 DIAGNOSIS — Z1231 Encounter for screening mammogram for malignant neoplasm of breast: Secondary | ICD-10-CM | POA: Diagnosis not present

## 2016-05-08 LAB — HM MAMMOGRAPHY

## 2016-05-15 ENCOUNTER — Encounter: Payer: Self-pay | Admitting: Internal Medicine

## 2016-05-15 ENCOUNTER — Ambulatory Visit (INDEPENDENT_AMBULATORY_CARE_PROVIDER_SITE_OTHER): Payer: Commercial Managed Care - HMO | Admitting: Internal Medicine

## 2016-05-15 VITALS — BP 124/86 | HR 60 | Temp 97.2°F | Resp 16 | Ht 62.0 in | Wt 208.0 lb

## 2016-05-15 DIAGNOSIS — M5416 Radiculopathy, lumbar region: Secondary | ICD-10-CM | POA: Diagnosis not present

## 2016-05-15 DIAGNOSIS — K219 Gastro-esophageal reflux disease without esophagitis: Secondary | ICD-10-CM

## 2016-05-15 DIAGNOSIS — M1711 Unilateral primary osteoarthritis, right knee: Secondary | ICD-10-CM

## 2016-05-15 DIAGNOSIS — F411 Generalized anxiety disorder: Secondary | ICD-10-CM

## 2016-05-15 DIAGNOSIS — G4700 Insomnia, unspecified: Secondary | ICD-10-CM | POA: Diagnosis not present

## 2016-05-15 DIAGNOSIS — G4730 Sleep apnea, unspecified: Secondary | ICD-10-CM

## 2016-05-15 DIAGNOSIS — K222 Esophageal obstruction: Secondary | ICD-10-CM

## 2016-05-15 DIAGNOSIS — IMO0001 Reserved for inherently not codable concepts without codable children: Secondary | ICD-10-CM

## 2016-05-15 MED ORDER — OXYCODONE-ACETAMINOPHEN 10-325 MG PO TABS: 1.0000 | ORAL_TABLET | Freq: Four times a day (QID) | ORAL | Status: DC | PRN

## 2016-05-15 MED ORDER — SUVOREXANT 20 MG PO TABS: 1.0000 | ORAL_TABLET | Freq: Every day | ORAL | Status: DC

## 2016-05-15 MED ORDER — PROMETHAZINE HCL 12.5 MG PO TABS: 12.5000 mg | ORAL_TABLET | Freq: Four times a day (QID) | ORAL | Status: DC | PRN

## 2016-05-15 MED ORDER — CLONAZEPAM 1 MG PO TABS: 1.0000 mg | ORAL_TABLET | Freq: Two times a day (BID) | ORAL | Status: DC | PRN

## 2016-05-15 MED ORDER — MECLIZINE HCL 25 MG PO TABS: 25.0000 mg | ORAL_TABLET | Freq: Three times a day (TID) | ORAL | Status: DC | PRN

## 2016-05-15 NOTE — Progress Notes (Signed)
Pre visit review using our clinic review tool, if applicable. No additional management support is needed unless otherwise documented below in the visit note. 

## 2016-05-15 NOTE — Patient Instructions (Signed)

## 2016-05-15 NOTE — Progress Notes (Signed)
Subjective:  Patient ID: [name], female    DOB: [birth year]  Age: 82 y.o. MRN: [id number]  CC: Depression; Osteoarthritis; and Back Pain   HPI NISHA VALEK presents for a follow-up on chronic pain. She complains of persistent unchanged pain in her lower back and large joints. She is getting symptom relief with Percocet. She also complains of persistent insomnia with difficulty falling asleep and staying asleep. I previously prescribed Belsomra but she tells me it is too expensive. She suffers from anxiety and panic and doesn't think Xanax is helping her much - she wants to know if I will change her to Klonopin. She was recently seen by gastroenterologist for trouble swallowing and was found to have a stricture. She had dilation performed and is now taking a proton pump inhibitor and tells me that she is feeling much better. She really experiences nausea when she does Phenergan helps.  Outpatient Prescriptions Prior to Visit  Medication Sig Dispense Refill  . aspirin EC 325 MG EC tablet Take 1 tablet (325 mg total) by mouth daily. 30 tablet 0  . calcium carbonate (OS-CAL) 600 MG TABS tablet Take 600 mg by mouth 2 (two) times daily with a meal.    . carvedilol (COREG) 6.25 MG tablet Take 1 tablet (6.25 mg total) by mouth 2 (two) times daily with a meal. 60 tablet 11  . Cholecalciferol (VITAMIN D3) 1000 UNITS CAPS Take 2 capsules (2,000 Units total) by mouth daily. 60 capsule 11  . conjugated estrogens (PREMARIN) vaginal cream Place 1 Applicatorful vaginally daily. 42.5 g 11  . isosorbide-hydrALAZINE (BIDIL) 20-37.5 MG per tablet Take 0.5 tablets by mouth 2 (two) times daily. 30 tablet 11  . omeprazole (PRILOSEC) 40 MG capsule Take 1 capsule (40 mg total) by mouth daily. 30 capsule 11  . traZODone (DESYREL) 100 MG tablet Take 2 tablets (200 mg total) by mouth at bedtime. 180 tablet 3  . ALPRAZolam (XANAX) 1 MG tablet take 1 tablet by mouth three times a day if needed for anxiety 90 tablet 3    . dexamethasone (DECADRON) 1 MG tablet Take 1 tablet by mouth once at 11 pm, before coming for labs at 8 am the next morning 1 tablet 0  . meclizine (ANTIVERT) 25 MG tablet Take 1 tablet (25 mg total) by mouth 3 (three) times daily as needed for dizziness. 60 tablet 2  . oxyCODONE-acetaminophen (PERCOCET) 10-325 MG tablet Take 1 tablet by mouth every 6 (six) hours as needed for pain. 120 tablet 0  . promethazine (PHENERGAN) 12.5 MG tablet Take 1 tablet (12.5 mg total) by mouth every 6 (six) hours as needed for nausea. 30 tablet 2  . Suvorexant (BELSOMRA) 20 MG TABS Take 1 tablet by mouth at bedtime. 30 tablet 5  . glucose blood (ONE TOUCH ULTRA TEST) test strip Test up to tid (Patient not taking: Reported on 05/15/2016) 100 each 12  . pravastatin (PRAVACHOL) 80 MG tablet Take 80 mg by mouth daily.     No facility-administered medications prior to visit.    ROS Review of Systems  Constitutional: Negative.   HENT: Negative.  Negative for trouble swallowing.   Eyes: Negative.   Respiratory: Negative.  Negative for cough, choking, shortness of breath and stridor.   Cardiovascular: Negative.  Negative for chest pain, palpitations and leg swelling.  Gastrointestinal: Positive for nausea. Negative for vomiting, abdominal pain, diarrhea, constipation and blood in stool.  Endocrine: Negative.   Genitourinary: Negative.  Negative for dysuria, urgency,  hematuria and difficulty urinating.  Musculoskeletal: Positive for back pain and arthralgias. Negative for myalgias, joint swelling, gait problem, neck pain and neck stiffness.  Skin: Negative.  Negative for color change, pallor and wound.  Allergic/Immunologic: Negative.   Neurological: Negative.   Hematological: Negative.  Negative for adenopathy. Does not bruise/bleed easily.  Psychiatric/Behavioral: Positive for sleep disturbance. Negative for self-injury, dysphoric mood and decreased concentration. The patient is nervous/anxious.     Objective:   BP 124/86 mmHg  Pulse 60  Temp(Src) 97.2 F (36.2 C) (Oral)  Resp 16  Ht 5\' 2"  (1.575 m)  Wt 208 lb (94.348 kg)  BMI 38.03 kg/m2  SpO2 96%  BP Readings from Last 3 Encounters:  05/15/16 124/86  04/15/16 126/82  01/17/16 128/82    Wt Readings from Last 3 Encounters:  05/15/16 208 lb (94.348 kg)  04/15/16 209 lb 12.8 oz (95.165 kg)  01/17/16 207 lb (93.895 kg)    Physical Exam  Constitutional: She is oriented to person, place, and time. No distress.  HENT:  Mouth/Throat: Oropharynx is clear and moist. No oropharyngeal exudate.  Eyes: Conjunctivae are normal. Right eye exhibits no discharge. Left eye exhibits no discharge. No scleral icterus.  Neck: Normal range of motion. Neck supple. No JVD present. No tracheal deviation present. No thyromegaly present.  Cardiovascular: Normal rate, regular rhythm, normal heart sounds and intact distal pulses.  Exam reveals no gallop and no friction rub.   No murmur heard. Pulmonary/Chest: Effort normal and breath sounds normal. No stridor. No respiratory distress. She has no wheezes. She has no rales. She exhibits no tenderness.  Abdominal: Soft. Bowel sounds are normal. She exhibits no distension and no mass. There is no tenderness. There is no rebound and no guarding.  Musculoskeletal: Normal range of motion. She exhibits no edema or tenderness.  Lymphadenopathy:    She has no cervical adenopathy.  Neurological: She is oriented to person, place, and time.  Skin: Skin is warm and dry. No rash noted. She is not diaphoretic. No erythema. No pallor.  Vitals reviewed.   Lab Results  Component Value Date   WBC 6.6 10/10/2015   HGB 14.1 10/10/2015   HCT 44.1 10/10/2015   PLT 156 10/10/2015   GLUCOSE 100* 10/10/2015   CHOL 190 05/16/2015   TRIG 139.0 05/16/2015   HDL 56.50 05/16/2015   LDLDIRECT 150.1 04/15/2011   LDLCALC 106* 05/16/2015   ALT 9 08/31/2015   AST 12 08/31/2015   NA 140 10/10/2015   K 3.8 04/15/2016   CL 105  10/10/2015   CREATININE 0.59 10/10/2015   BUN 13 10/10/2015   CO2 32 10/10/2015   TSH 5.05* 04/24/2016   INR 1.01 03/10/2015   HGBA1C 5.0 05/16/2015    No results found.  Assessment & Plan:    I have discontinued Ms. Hedlund's pravastatin, ALPRAZolam, and dexamethasone. I am also having her start on clonazePAM. Additionally, I am having her maintain her Vitamin D3, calcium carbonate, conjugated estrogens, aspirin, glucose blood, isosorbide-hydrALAZINE, traZODone, carvedilol, omeprazole, promethazine, Suvorexant, meclizine, and oxyCODONE-acetaminophen.  Meds ordered this encounter  Medications  . DISCONTD: oxyCODONE-acetaminophen (PERCOCET) 10-325 MG tablet    Sig: Take 1 tablet by mouth every 6 (six) hours as needed for pain.    Dispense:  120 tablet    Refill:  0    Fill on or after 05/15/16  . promethazine (PHENERGAN) 12.5 MG tablet    Sig: Take 1 tablet (12.5 mg total) by mouth every 6 (six) hours as needed  for nausea.    Dispense:  30 tablet    Refill:  2  . Suvorexant (BELSOMRA) 20 MG TABS    Sig: Take 1 tablet by mouth at bedtime.    Dispense:  30 tablet    Refill:  5  . meclizine (ANTIVERT) 25 MG tablet    Sig: Take 1 tablet (25 mg total) by mouth 3 (three) times daily as needed for dizziness.    Dispense:  60 tablet    Refill:  2  . clonazePAM (KLONOPIN) 1 MG tablet    Sig: Take 1 tablet (1 mg total) by mouth 2 (two) times daily as needed for anxiety.    Dispense:  60 tablet    Refill:  5  . DISCONTD: oxyCODONE-acetaminophen (PERCOCET) 10-325 MG tablet    Sig: Take 1 tablet by mouth every 6 (six) hours as needed for pain.    Dispense:  120 tablet    Refill:  0    Fill on or after 06/14/16  . oxyCODONE-acetaminophen (PERCOCET) 10-325 MG tablet    Sig: Take 1 tablet by mouth every 6 (six) hours as needed for pain.    Dispense:  120 tablet    Refill:  0    Fill on or after 07/15/16     Follow-up: Return in about 6 months (around 11/14/2016).  Scarlette Calico,  MD

## 2016-05-16 ENCOUNTER — Telehealth: Payer: Self-pay | Admitting: Internal Medicine

## 2016-05-16 ENCOUNTER — Ambulatory Visit: Payer: Commercial Managed Care - HMO | Admitting: Internal Medicine

## 2016-05-16 DIAGNOSIS — G4730 Sleep apnea, unspecified: Principal | ICD-10-CM

## 2016-05-16 DIAGNOSIS — G4700 Insomnia, unspecified: Secondary | ICD-10-CM

## 2016-05-16 MED ORDER — ZOLPIDEM TARTRATE 5 MG PO TABS: 5.0000 mg | ORAL_TABLET | Freq: Every evening | ORAL | Status: DC | PRN

## 2016-05-16 NOTE — Telephone Encounter (Signed)
Pt called in and said that the Belsomra is going to cost her $300 and her ins will cover Zolpidem instead .  Can she have this instead?

## 2016-05-16 NOTE — Assessment & Plan Note (Signed)
Will continue current regimen for pain control

## 2016-05-16 NOTE — Telephone Encounter (Signed)
changed

## 2016-05-16 NOTE — Assessment & Plan Note (Signed)
Belsomra is too expensive, will try low-dose Ambien

## 2016-05-16 NOTE — Assessment & Plan Note (Signed)
Improvement noted 

## 2016-05-16 NOTE — Assessment & Plan Note (Signed)
She agrees to decrease her caloric intake in an effort to lose weight

## 2016-05-16 NOTE — Assessment & Plan Note (Signed)
At her request, I will change Xanax to Klonopin

## 2016-05-17 NOTE — Telephone Encounter (Signed)
Notified pt w/MD response. Rx fax to [facility]...[name]

## 2016-06-11 ENCOUNTER — Ambulatory Visit (INDEPENDENT_AMBULATORY_CARE_PROVIDER_SITE_OTHER): Payer: Commercial Managed Care - HMO | Admitting: Internal Medicine

## 2016-06-11 ENCOUNTER — Encounter: Payer: Self-pay | Admitting: Internal Medicine

## 2016-06-11 ENCOUNTER — Other Ambulatory Visit (INDEPENDENT_AMBULATORY_CARE_PROVIDER_SITE_OTHER): Payer: Commercial Managed Care - HMO

## 2016-06-11 VITALS — BP 138/88 | HR 64 | Temp 97.9°F | Wt 208.0 lb

## 2016-06-11 DIAGNOSIS — I10 Essential (primary) hypertension: Secondary | ICD-10-CM

## 2016-06-11 DIAGNOSIS — IMO0001 Reserved for inherently not codable concepts without codable children: Secondary | ICD-10-CM

## 2016-06-11 DIAGNOSIS — E785 Hyperlipidemia, unspecified: Secondary | ICD-10-CM

## 2016-06-11 DIAGNOSIS — E278 Other specified disorders of adrenal gland: Secondary | ICD-10-CM | POA: Diagnosis not present

## 2016-06-11 DIAGNOSIS — E063 Autoimmune thyroiditis: Secondary | ICD-10-CM

## 2016-06-11 DIAGNOSIS — H8110 Benign paroxysmal vertigo, unspecified ear: Secondary | ICD-10-CM

## 2016-06-11 DIAGNOSIS — F411 Generalized anxiety disorder: Secondary | ICD-10-CM

## 2016-06-11 LAB — LIPID PANEL
CHOL/HDL RATIO: 4
CHOLESTEROL: 222 mg/dL — AB (ref 0–200)
HDL: 54.2 mg/dL (ref >39.00)
NONHDL: 168.02
Triglycerides: 279 mg/dL — ABNORMAL HIGH (ref 0.0–149.0)
VLDL: 55.8 mg/dL — AB (ref 0.0–40.0)

## 2016-06-11 LAB — TSH: TSH: 3.8 u[IU]/mL (ref 0.35–4.50)

## 2016-06-11 LAB — BASIC METABOLIC PANEL
BUN: 12 mg/dL (ref 6–23)
CHLORIDE: 102 meq/L (ref 96–112)
CO2: 34 mEq/L — ABNORMAL HIGH (ref 19–32)
CREATININE: 0.66 mg/dL (ref 0.40–1.20)
Calcium: 9.1 mg/dL (ref 8.4–10.5)
GFR: 110.45 mL/min (ref >60.00)
Glucose, Bld: 92 mg/dL (ref 70–99)
POTASSIUM: 3.8 meq/L (ref 3.5–5.1)
Sodium: 139 mEq/L (ref 135–145)

## 2016-06-11 LAB — LDL CHOLESTEROL, DIRECT: LDL DIRECT: 114 mg/dL

## 2016-06-11 MED ORDER — CARVEDILOL 6.25 MG PO TABS
6.2500 mg | ORAL_TABLET | Freq: Two times a day (BID) | ORAL | Status: DC
Start: 2016-06-11 — End: 2017-06-12

## 2016-06-11 MED ORDER — ISOSORB DINITRATE-HYDRALAZINE 20-37.5 MG PO TABS: 0.5000 | ORAL_TABLET | Freq: Two times a day (BID) | ORAL | Status: DC

## 2016-06-11 NOTE — Progress Notes (Signed)
Pre visit review using our clinic review tool, if applicable. No additional management support is needed unless otherwise documented below in the visit note. 

## 2016-06-11 NOTE — Patient Instructions (Signed)
Hypertension Hypertension, commonly called high blood pressure, is when the force of blood pumping through your arteries is too strong. Your arteries are the blood vessels that carry blood from your heart throughout your body. A blood pressure reading consists of a higher number over a lower number, such as 110/72. The higher number (systolic) is the pressure inside your arteries when your heart pumps. The lower number (diastolic) is the pressure inside your arteries when your heart relaxes. Ideally you want your blood pressure below 120/80. Hypertension forces your heart to work harder to pump blood. Your arteries may become narrow or stiff. Having untreated or uncontrolled hypertension can cause heart attack, stroke, kidney disease, and other problems. RISK FACTORS Some risk factors for high blood pressure are controllable. Others are not.  Risk factors you cannot control include:   Race. You may be at higher risk if you are African American.  Age. Risk increases with age.  Gender. Men are at higher risk than women before age 45 years. After age 65, women are at higher risk than men. Risk factors you can control include:  Not getting enough exercise or physical activity.  Being overweight.  Getting too much fat, sugar, calories, or salt in your diet.  Drinking too much alcohol. SIGNS AND SYMPTOMS Hypertension does not usually cause signs or symptoms. Extremely high blood pressure (hypertensive crisis) may cause headache, anxiety, shortness of breath, and nosebleed. DIAGNOSIS To check if you have hypertension, your health care provider will measure your blood pressure while you are seated, with your arm held at the level of your heart. It should be measured at least twice using the same arm. Certain conditions can cause a difference in blood pressure between your right and left arms. A blood pressure reading that is higher than normal on one occasion does not mean that you need treatment. If  it is not clear whether you have high blood pressure, you may be asked to return on a different day to have your blood pressure checked again. Or, you may be asked to monitor your blood pressure at home for 1 or more weeks. TREATMENT Treating high blood pressure includes making lifestyle changes and possibly taking medicine. Living a healthy lifestyle can help lower high blood pressure. You may need to change some of your habits. Lifestyle changes may include:  Following the DASH diet. This diet is high in fruits, vegetables, and whole grains. It is low in salt, red meat, and added sugars.  Keep your sodium intake below 2,300 mg per day.  Getting at least 30-45 minutes of aerobic exercise at least 4 times per week.  Losing weight if necessary.  Not smoking.  Limiting alcoholic beverages.  Learning ways to reduce stress. Your health care provider may prescribe medicine if lifestyle changes are not enough to get your blood pressure under control, and if one of the following is true:  You are 18-59 years of age and your systolic blood pressure is above 140.  You are 60 years of age or older, and your systolic blood pressure is above 150.  Your diastolic blood pressure is above 90.  You have diabetes, and your systolic blood pressure is over 140 or your diastolic blood pressure is over 90.  You have kidney disease and your blood pressure is above 140/90.  You have heart disease and your blood pressure is above 140/90. Your personal target blood pressure may vary depending on your medical conditions, your age, and other factors. HOME CARE INSTRUCTIONS    Have your blood pressure rechecked as directed by your health care provider.   Take medicines only as directed by your health care provider. Follow the directions carefully. Blood pressure medicines must be taken as prescribed. The medicine does not work as well when you skip doses. Skipping doses also puts you at risk for  problems.  Do not smoke.   Monitor your blood pressure at home as directed by your health care provider. SEEK MEDICAL CARE IF:   You think you are having a reaction to medicines taken.  You have recurrent headaches or feel dizzy.  You have swelling in your ankles.  You have trouble with your vision. SEEK IMMEDIATE MEDICAL CARE IF:  You develop a severe headache or confusion.  You have unusual weakness, numbness, or feel faint.  You have severe chest or abdominal pain.  You vomit repeatedly.  You have trouble breathing. MAKE SURE YOU:   Understand these instructions.  Will watch your condition.  Will get help right away if you are not doing well or get worse.   This information is not intended to replace advice given to you by your health care provider. Make sure you discuss any questions you have with your health care provider.   Document Released: 11/11/2005 Document Revised: 03/28/2015 Document Reviewed: 09/03/2013 Elsevier Interactive Patient Education 2016 [employer]  

## 2016-06-11 NOTE — Progress Notes (Signed)
Subjective:  Patient ID: [name], female    DOB: Oct 22, 1933  Age: 82 y.o. MRN: [id number]  CC: Hypertension   HPI [name] presents for a blood pressure check. She complains of persistent episodes of dizziness and vertigo that are positional. She tells me she saw an audiologist about 3 weeks ago and was told that her ears were normal. She tells me meclizine has not been helpful. She also tells me her blood pressure has been well controlled, she has had no recent episodes of chest pain/shortness of breath/edema/fatigue/palpitations.  Outpatient Medications Prior to Visit  Medication Sig Dispense Refill  . aspirin EC 325 MG EC tablet Take 1 tablet (325 mg total) by mouth daily. 30 tablet 0  . calcium carbonate (OS-CAL) 600 MG TABS tablet Take 600 mg by mouth 2 (two) times daily with a meal.    . Cholecalciferol (VITAMIN D3) 1000 UNITS CAPS Take 2 capsules (2,000 Units total) by mouth daily. 60 capsule 11  . clonazePAM (KLONOPIN) 1 MG tablet Take 1 tablet (1 mg total) by mouth 2 (two) times daily as needed for anxiety. 60 tablet 5  . conjugated estrogens (PREMARIN) vaginal cream Place 1 Applicatorful vaginally daily. 42.5 g 11  . omeprazole (PRILOSEC) 40 MG capsule Take 1 capsule (40 mg total) by mouth daily. 30 capsule 11  . oxyCODONE-acetaminophen (PERCOCET) 10-325 MG tablet Take 1 tablet by mouth every 6 (six) hours as needed for pain. 120 tablet 0  . promethazine (PHENERGAN) 12.5 MG tablet Take 1 tablet (12.5 mg total) by mouth every 6 (six) hours as needed for nausea. 30 tablet 2  . traZODone (DESYREL) 100 MG tablet Take 2 tablets (200 mg total) by mouth at bedtime. 180 tablet 3  . zolpidem (AMBIEN) 5 MG tablet Take 1 tablet (5 mg total) by mouth at bedtime as needed for sleep. 30 tablet 5  . carvedilol (COREG) 6.25 MG tablet Take 1 tablet (6.25 mg total) by mouth 2 (two) times daily with a meal. 60 tablet 11  . isosorbide-hydrALAZINE (BIDIL) 20-37.5 MG per tablet Take 0.5 tablets  by mouth 2 (two) times daily. 30 tablet 11  . glucose blood (ONE TOUCH ULTRA TEST) test strip Test up to tid (Patient not taking: Reported on 05/15/2016) 100 each 12  . meclizine (ANTIVERT) 25 MG tablet Take 1 tablet (25 mg total) by mouth 3 (three) times daily as needed for dizziness. (Patient not taking: Reported on 06/11/2016) 60 tablet 2   No facility-administered medications prior to visit.     ROS Review of Systems  Constitutional: Negative for activity change, appetite change, fatigue and unexpected weight change.  HENT: Negative.   Eyes: Negative for visual disturbance.  Respiratory: Negative.  Negative for cough, choking, chest tightness, shortness of breath, wheezing and stridor.   Cardiovascular: Negative for chest pain, palpitations and leg swelling.  Gastrointestinal: Negative.  Negative for abdominal pain, constipation, diarrhea, nausea and vomiting.  Genitourinary: Negative.   Musculoskeletal: Negative.  Negative for arthralgias, back pain, joint swelling, myalgias and neck pain.  Skin: Negative.   Neurological: Positive for dizziness. Negative for tremors, syncope, weakness, numbness and headaches.  Hematological: Negative for adenopathy. Does not bruise/bleed easily.  Psychiatric/Behavioral: Positive for sleep disturbance. Negative for behavioral problems, confusion, decreased concentration, dysphoric mood, self-injury and suicidal ideas. The patient is nervous/anxious.     Objective:  BP 138/88   Pulse 64   Temp 97.9 F (36.6 C)   Wt 208 lb (94.3 kg)   SpO2  96%   BMI 38.04 kg/m    BP Readings from Last 3 Encounters:  06/11/16 138/88  05/15/16 124/86  04/15/16 126/82    Wt Readings from Last 3 Encounters:  06/11/16 208 lb (94.3 kg)  05/15/16 208 lb (94.3 kg)  04/15/16 209 lb 12.8 oz (95.2 kg)    Physical Exam  Constitutional: She is oriented to person, place, and time. No distress.  HENT:  Mouth/Throat: Oropharynx is clear and moist. No oropharyngeal  exudate.  Eyes: Conjunctivae are normal. Right eye exhibits no discharge. Left eye exhibits no discharge. No scleral icterus.  Neck: Normal range of motion. Neck supple. No JVD present. No tracheal deviation present. No thyromegaly present.  Cardiovascular: Normal rate, regular rhythm, normal heart sounds and intact distal pulses.  Exam reveals no gallop and no friction rub.   No murmur heard. Pulmonary/Chest: Effort normal and breath sounds normal. No stridor. No respiratory distress. She has no wheezes. She has no rales. She exhibits no tenderness.  Abdominal: Soft. Bowel sounds are normal. She exhibits no distension and no mass. There is no tenderness. There is no rebound and no guarding.  Musculoskeletal: Normal range of motion. She exhibits no edema, tenderness or deformity.  Lymphadenopathy:    She has no cervical adenopathy.  Neurological: She is oriented to person, place, and time.  Skin: Skin is warm and dry. No rash noted. She is not diaphoretic. No erythema. No pallor.  Psychiatric: She has a normal mood and affect. Her behavior is normal. Judgment and thought content normal.  Vitals reviewed.   Lab Results  Component Value Date   WBC 6.6 10/10/2015   HGB 14.1 10/10/2015   HCT 44.1 10/10/2015   PLT 156 10/10/2015   GLUCOSE 92 06/11/2016   CHOL 222 (H) 06/11/2016   TRIG 279.0 (H) 06/11/2016   HDL 54.20 06/11/2016   LDLDIRECT 114.0 06/11/2016   LDLCALC 106 (H) 05/16/2015   ALT 9 08/31/2015   AST 12 08/31/2015   NA 139 06/11/2016   K 3.8 06/11/2016   CL 102 06/11/2016   CREATININE 0.66 06/11/2016   BUN 12 06/11/2016   CO2 34 (H) 06/11/2016   TSH 3.80 06/11/2016   INR 1.01 03/10/2015   HGBA1C 5.0 05/16/2015    No results found.  Assessment & Plan:   [name] was seen today for hypertension.  Diagnoses and all orders for this visit:  Adrenal incidentaloma ([place])- Her blood pressure and electrolytes remain normal, I'll see any evidence that this is an active adrenal  adenoma -     Basic metabolic panel; Future  Hyperlipidemia with target LDL less than 130- she is achieved her LDL goal is doing well on statin therapy. -     TSH; Future -     Lipid panel; Future  Hashimoto's thyroiditis- clinically she appears euthyroid and her TSH is in the normal range -     TSH; Future  Essential hypertension- her blood pressure is adequately well-controlled, electrolytes and renal function are stable. -     carvedilol (COREG) 6.25 MG tablet; Take 1 tablet (6.25 mg total) by mouth 2 (two) times daily with a meal. -     isosorbide-hydrALAZINE (BIDIL) 20-37.5 MG tablet; Take 0.5 tablets by mouth 2 (two) times daily. -     Basic metabolic panel; Future  Obesity, Class II, BMI 35-39.9, with comorbidity ([place])- she agrees to work on her lifestyle modifications to help her lose weight.  GAD (generalized anxiety disorder)- she will continue Klonopin as needed.  Benign paroxysmal positional vertigo, unspecified laterality- she agrees to do outpatient therapy to help with this. -     Ambulatory referral to Physical Therapy   I have discontinued Ms. Scroggins's meclizine. I have also changed her isosorbide-hydrALAZINE. Additionally, I am having her maintain her Vitamin D3, calcium carbonate, conjugated estrogens, aspirin, glucose blood, traZODone, omeprazole, promethazine, clonazePAM, oxyCODONE-acetaminophen, zolpidem, and carvedilol.  Meds ordered this encounter  Medications  . carvedilol (COREG) 6.25 MG tablet    Sig: Take 1 tablet (6.25 mg total) by mouth 2 (two) times daily with a meal.    Dispense:  60 tablet    Refill:  11  . isosorbide-hydrALAZINE (BIDIL) 20-37.5 MG tablet    Sig: Take 0.5 tablets by mouth 2 (two) times daily.    Dispense:  30 tablet    Refill:  11     Follow-up: Return in about 4 months (around 10/12/2016).  Scarlette Calico, MD

## 2016-06-26 ENCOUNTER — Emergency Department (HOSPITAL_COMMUNITY)
Admission: EM | Admit: 2016-06-26 | Discharge: 2016-06-27 | Disposition: A | Discharge status: Home / Self Care | Payer: Commercial Managed Care - HMO | Attending: Emergency Medicine | Admitting: Emergency Medicine

## 2016-06-26 ENCOUNTER — Encounter (HOSPITAL_COMMUNITY): Payer: Self-pay

## 2016-06-26 DIAGNOSIS — I5032 Chronic diastolic (congestive) heart failure: Secondary | ICD-10-CM | POA: Diagnosis not present

## 2016-06-26 DIAGNOSIS — R29810 Facial weakness: Secondary | ICD-10-CM | POA: Diagnosis not present

## 2016-06-26 DIAGNOSIS — E119 Type 2 diabetes mellitus without complications: Secondary | ICD-10-CM | POA: Insufficient documentation

## 2016-06-26 DIAGNOSIS — I10 Essential (primary) hypertension: Secondary | ICD-10-CM

## 2016-06-26 DIAGNOSIS — R42 Dizziness and giddiness: Secondary | ICD-10-CM | POA: Diagnosis not present

## 2016-06-26 DIAGNOSIS — I110 Hypertensive heart disease with heart failure: Secondary | ICD-10-CM | POA: Insufficient documentation

## 2016-06-26 LAB — URINALYSIS, ROUTINE W REFLEX MICROSCOPIC
Bilirubin Urine: NEGATIVE
GLUCOSE, UA: NEGATIVE mg/dL
KETONES UR: NEGATIVE mg/dL
Nitrite: NEGATIVE
PROTEIN: NEGATIVE mg/dL
Specific Gravity, Urine: 1.006 (ref 1.005–1.030)
pH: 7 (ref 5.0–8.0)

## 2016-06-26 LAB — URINE MICROSCOPIC-ADD ON
Bacteria, UA: NONE SEEN
RBC / HPF: NONE SEEN RBC/hpf (ref 0–5)

## 2016-06-26 NOTE — ED Provider Notes (Signed)
[facility] DEPT Provider Note   CSN: [id number] Arrival date & time: 06/26/16  2206  First Provider Contact:   First MD Initiated Contact with Patient 06/26/16 2301      By signing my name below, I, Arianna Nassar, attest that this documentation has been prepared under the direction and in the presence of Merryl Hacker, MD.  Electronically Signed: Julien Nordmann, ED Scribe. 06/26/16. 11:09 PM.    History   Chief Complaint Chief Complaint  Patient presents with  . Dizziness   The history is provided by the patient. No language interpreter was used.   HPI Comments: [name] is a 82 y.o. female who has a PMhx of CHF, DMII, HLD, HTN, presents to the Emergency Department complaining of sudden onset, gradual worsening, moderate, intermittent dizziness onset this afternoon. She describes the dizziness as room spinning and associated light headedness. Pt says she has been having intermittent dizzy spells for the past year and notes it became progressively worse. She notes she has been feeling bad since Sunday and decided to take her blood pressure. She says her blood pressure was 240's/210 on Sunday and has she has been checking it frequently since using her home cuffs. 254/110 and "HIGH' have been her BP readings since Sunday. There is also slight left sided facial droop noted. Pt notes having an appointment with "dizzy clinic" on Friday. She denies numbness, weakness, difficulty walking, visual changes, chest pain, shortness of breath, hx of CVA, use of blood thinners. Pt lives at home by herself.  Past Medical History:  Diagnosis Date  . Abdominal pain   . Anemia    NOS  . Anxiety   . Bruises easily   . Chronic diastolic CHF (congestive heart failure) ([place])    a. 06/2013 EF 65-70%.  . Chronic headaches    HISTORY OF   . Depression   . Diabetes mellitus    type II  DIET CONTROLLED  . Diarrhea   . Hepatitis    AGE 30S  . Hyperlipidemia   . Hypertension   . Mild aortic  stenosis    a. 06/2013 Echo: EF 65-70%, mod LVH with focal basal hypertrophy, very mild AS.  Marland Kitchen Osteoarthritis   . Oxygen desaturation during sleep    USES 2 LITERS BEDTIME   VIA CPAP 06/2012 [facility]   . Shortness of breath    WITH EXERTION USES 2 L O2 BEDTIME  . Sleep apnea    CPAP WITH O2 2 LITERS 2013 ([facility])  . Wears dentures   . Wears glasses     Patient Active Problem List   Diagnosis Date Noted  . Obesity, Class II, BMI 35-39.9, with comorbidity ([place]) 05/15/2016  . Insomnia w/ sleep apnea 10/16/2015  . Arthralgia 08/31/2015  . Other constipation 03/16/2015  . IBS (irritable bowel syndrome) 02/27/2015  . Lumbar spondylosis 02/10/2015  . Lumbar radiculopathy 02/10/2015  . Osteoarthritis of right knee 02/10/2015  . Hashimoto's thyroiditis 05/05/2014  . Hyperglycemia 02/21/2014  . Adrenal incidentaloma ([place]) 11/04/2013  . Benign paroxysmal positional vertigo 07/15/2013  . Aortic stenosis, mild 07/14/2013  . OAB (overactive bladder) 02/24/2013  . GERD with stricture 12/24/2012  . Sleep-related hypoventilation 07/30/2012  . OSA (obstructive sleep apnea) 04/16/2012  . Osteopenia 04/16/2012  . Preventative health care 05/24/2011  . VITAMIN D DEFICIENCY 12/04/2010  . Hyperlipidemia with target LDL less than 130 12/03/2010  . GAD (generalized anxiety disorder) 12/03/2010  . DEPRESSION 12/03/2010  . Essential hypertension 12/03/2010  Past Surgical History:  Procedure Laterality Date  . ABDOMINAL HYSTERECTOMY    . APPENDECTOMY    . CHOLECYSTECTOMY    . JOINT REPLACEMENT     LEFT KNEE   . KNEE ARTHROPLASTY Right 03/22/2015   Procedure: COMPUTER ASSISTED TOTAL KNEE ARTHROPLASTY;  Surgeon: Marybelle Killings, MD;  Location: [facility];  Service: Orthopedics;  Laterality: Right;  . KNEE ARTHROSCOPY     RIGHT  . LEFT AND RIGHT HEART CATHETERIZATION WITH CORONARY ANGIOGRAM N/A 10/28/2013   Procedure: LEFT AND RIGHT HEART CATHETERIZATION WITH CORONARY ANGIOGRAM;  Surgeon: Blane Ohara, MD;  Location: [facility] CATH LAB;  Service: Cardiovascular;  Laterality: N/A;  . LEFT HEART CATHETERIZATION WITH CORONARY ANGIOGRAM N/A 11/14/2014   Procedure: LEFT HEART CATHETERIZATION WITH CORONARY ANGIOGRAM;  Surgeon: Josue Hector, MD;  Location: [facility] CATH LAB;  Service: Cardiovascular;  Laterality: N/A;  . REVISION TOTAL KNEE ARTHROPLASTY  2011  . ROTATOR CUFF REPAIR  2010  . SHOULDER ARTHROSCOPY  09/17/2012   Procedure: ARTHROSCOPY SHOULDER;  Surgeon: Sharmon Revere, MD;  Location: [facility];  Service: Orthopedics;  Laterality: Left;  . TONSILLECTOMY    . TONSILLECTOMY      OB History    No data available       Home Medications    Prior to Admission medications   Medication Sig Start Date End Date Taking? Authorizing Provider  aspirin EC 325 MG tablet Take 325 mg by mouth daily.   Yes Historical Provider, MD  calcium carbonate (OS-CAL) 600 MG TABS tablet Take 600 mg by mouth 2 (two) times daily with a meal.   Yes Historical Provider, MD  carvedilol (COREG) 6.25 MG tablet Take 1 tablet (6.25 mg total) by mouth 2 (two) times daily with a meal. 06/11/16  Yes [name], MD  cholecalciferol (VITAMIN D) 1000 units tablet Take 2,000 Units by mouth daily.   Yes Historical Provider, MD  clonazePAM (KLONOPIN) 1 MG tablet Take 1 tablet (1 mg total) by mouth 2 (two) times daily as needed for anxiety. 05/15/16  Yes [name], MD  conjugated estrogens (PREMARIN) vaginal cream Place 1 Applicatorful vaginally at bedtime as needed (for dryness).   Yes Historical Provider, MD  isosorbide-hydrALAZINE (BIDIL) 20-37.5 MG tablet Take 0.5 tablets by mouth 2 (two) times daily. 06/11/16  Yes [name], MD  meclizine (ANTIVERT) 25 MG tablet Take 25 mg by mouth 3 (three) times daily as needed for dizziness.   Yes Historical Provider, MD  omeprazole (PRILOSEC) 40 MG capsule Take 1 capsule (40 mg total) by mouth daily. 01/21/16  Yes [name], MD  oxyCODONE-acetaminophen (PERCOCET) 10-325 MG  tablet Take 1 tablet by mouth every 6 (six) hours as needed for pain. 05/15/16  Yes [name], MD  promethazine (PHENERGAN) 12.5 MG tablet Take 1 tablet (12.5 mg total) by mouth every 6 (six) hours as needed for nausea. 05/15/16  Yes [name], MD  traZODone (DESYREL) 100 MG tablet Take 200 mg by mouth at bedtime as needed for sleep.   Yes Historical Provider, MD    Family History Family History  Problem Relation Age of Onset  . Cancer Father     prostate cancer  . Heart disease Sister   . Heart disease Brother   . Alcohol abuse Other   . Hypertension Other   . Kidney disease Other   . Mental illness Other   . Emphysema Brother     Social History Social History  Substance Use Topics  . Smoking status: Never Smoker  . Smokeless tobacco: Never Used  . Alcohol use No     Allergies   Amlodipine; Enalapril; Losartan potassium; Morphine and related; and Metformin   Review of Systems Review of Systems  Eyes: Negative for visual disturbance.  Respiratory: Negative for shortness of breath.   Cardiovascular: Negative for chest pain.  Gastrointestinal: Negative for abdominal pain.  Neurological: Positive for dizziness and light-headedness. Negative for weakness and headaches.  All other systems reviewed and are negative.    Physical Exam Updated Vital Signs BP (!) 189/53 (BP Location: Left Arm)   Pulse (!) 51   Temp 97.8 F (36.6 C) (Oral)   Resp 15   SpO2 94%   Physical Exam  Constitutional: She is oriented to person, place, and time.  Elderly, no acute destress  HENT:  Head: Normocephalic and atraumatic.  Eyes: Pupils are equal, round, and reactive to light.  No nystagmus noted  Cardiovascular: Normal rate, regular rhythm and normal heart sounds.   Pulmonary/Chest: Effort normal and breath sounds normal. No respiratory distress. She has no wheezes.  Abdominal: Soft. Bowel sounds are normal. There is no tenderness. There is no guarding.  Neurological: She  is alert and oriented to person, place, and time.  Subtle downturn of the left side of her lips, corrects with smiling, otherwise cranial nerves II through XII intact, no drift, no dysmetria to finger-nose-finger, 5 out of 5 strength in all 4 extremities  Skin: Skin is warm and dry.  Psychiatric: She has a normal mood and affect.  Nursing note and vitals reviewed.    ED Treatments / Results  DIAGNOSTIC STUDIES: Oxygen Saturation is 94% on RA, low by my interpretation.  COORDINATION OF CARE:  11:08 PM Discussed treatment plan with pt at bedside and pt agreed to plan.  Labs (all labs ordered are listed, but only abnormal results are displayed) Labs Reviewed  URINALYSIS, [place] (NOT AT [facility]) - Abnormal; Notable for the following:       Result Value   Hgb urine dipstick SMALL (*)    Leukocytes, UA TRACE (*)    All other components within normal limits  URINE MICROSCOPIC-ADD ON - Abnormal; Notable for the following:    Squamous Epithelial / LPF 0-5 (*)    All other components within normal limits  CBC WITH DIFFERENTIAL/PLATELET - Abnormal; Notable for the following:    Platelets 142 (*)    All other components within normal limits  BASIC METABOLIC PANEL - Abnormal; Notable for the following:    Potassium 3.4 (*)    Glucose, Bld 103 (*)    All other components within normal limits    EKG  EKG Interpretation  Date/Time:  Wednesday June 26 2016 22:45:14 EDT Ventricular Rate:  51 PR Interval:    QRS Duration: 88 QT Interval:  427 QTC Calculation: 394 R Axis:   44 Text Interpretation:  Sinus rhythm Ventricular premature complex Probable anteroseptal infarct, recent Confirmed by Johnney Killian, MD, Jeannie Done [phone]) on 06/26/2016 10:48:20 PM       Radiology No results found.  Procedures Procedures (including critical care time)  Medications Ordered in ED Medications  diazepam (VALIUM) tablet 2 mg (not administered)  isosorbide-hydrALAZINE (BIDIL) 20-37.5 MG  per tablet 0.5 tablet (not administered)     Initial Impression / Assessment and Plan / ED Course  I have reviewed the triage vital signs and the nursing notes.  Pertinent labs & imaging results that were  available during my care of the patient were reviewed by me and considered in my medical decision making (see chart for details).  Clinical Course   Patient presents with dizziness and hypertension. Dizziness has been ongoing and chronic. Reports that she has an appointment later this week to be evaluated. Meclizine has not helped. She states that it has been worse since Sunday and has noted her blood pressure has been elevated. Initial blood pressures here 0000000 systolic.  She took her carvedilol but not BiDil.  She is nontoxic. No evidence of cerebellar dysfunction on neurologic exam. However, I have noted a slight droop of the left lips which corrects with smiling. Unclear how long this is been there. Based on chart review, I cannot find any neuro imaging. Lab work is reassuring. Patient was given her nightly dose of BiDil. She was also given Valium for dizziness. Given slight facial droop and persistent dizziness without imaging, will transfer to [facility] for [facility]. Discussed with Dr. Stark Jock.  Final Clinical Impressions(s) / ED Diagnoses   Final diagnoses:  Dizziness  Essential hypertension   I personally performed the services described in this documentation, which was scribed in my presence. The recorded information has been reviewed and is accurate.  New Prescriptions New Prescriptions   No medications on file     Merryl Hacker, MD 06/27/16 [phone]

## 2016-06-26 NOTE — ED Triage Notes (Signed)
Pt complains of dizziness and recent readings of hypertension on home blood pressure monitors

## 2016-06-27 ENCOUNTER — Ambulatory Visit (INDEPENDENT_AMBULATORY_CARE_PROVIDER_SITE_OTHER): Payer: Commercial Managed Care - HMO | Admitting: Internal Medicine

## 2016-06-27 ENCOUNTER — Emergency Department (HOSPITAL_COMMUNITY): Payer: Commercial Managed Care - HMO

## 2016-06-27 ENCOUNTER — Telehealth: Payer: Self-pay | Admitting: Internal Medicine

## 2016-06-27 ENCOUNTER — Other Ambulatory Visit (INDEPENDENT_AMBULATORY_CARE_PROVIDER_SITE_OTHER): Payer: Commercial Managed Care - HMO

## 2016-06-27 ENCOUNTER — Encounter: Payer: Self-pay | Admitting: Internal Medicine

## 2016-06-27 VITALS — BP 162/118 | HR 75 | Temp 98.2°F | Resp 16 | Wt 206.0 lb

## 2016-06-27 DIAGNOSIS — R29810 Facial weakness: Secondary | ICD-10-CM | POA: Diagnosis not present

## 2016-06-27 DIAGNOSIS — I10 Essential (primary) hypertension: Secondary | ICD-10-CM | POA: Diagnosis not present

## 2016-06-27 DIAGNOSIS — N3281 Overactive bladder: Secondary | ICD-10-CM

## 2016-06-27 DIAGNOSIS — F068 Other specified mental disorders due to known physiological condition: Secondary | ICD-10-CM

## 2016-06-27 DIAGNOSIS — N3000 Acute cystitis without hematuria: Secondary | ICD-10-CM | POA: Diagnosis not present

## 2016-06-27 DIAGNOSIS — F0390 Unspecified dementia without behavioral disturbance: Secondary | ICD-10-CM | POA: Insufficient documentation

## 2016-06-27 LAB — URINALYSIS, ROUTINE W REFLEX MICROSCOPIC
Bilirubin Urine: NEGATIVE
Ketones, ur: NEGATIVE
Nitrite: NEGATIVE
SPECIFIC GRAVITY, URINE: 1.015 (ref 1.000–1.030)
TOTAL PROTEIN, URINE-UPE24: 30 — AB
Urine Glucose: NEGATIVE
Urobilinogen, UA: 0.2 (ref 0.0–1.0)
pH: 8 (ref 5.0–8.0)

## 2016-06-27 LAB — CBC WITH DIFFERENTIAL/PLATELET
BASOS ABS: 0 10*3/uL (ref 0.0–0.1)
Basophils Relative: 0 %
EOS PCT: 4 %
Eosinophils Absolute: 0.3 10*3/uL (ref 0.0–0.7)
HCT: 42 % (ref 36.0–46.0)
Hemoglobin: 13.5 g/dL (ref 12.0–15.0)
LYMPHS ABS: 2.2 10*3/uL (ref 0.7–4.0)
LYMPHS PCT: 29 %
MCH: 29.9 pg (ref 26.0–34.0)
MCHC: 32.1 g/dL (ref 30.0–36.0)
MCV: 93.1 fL (ref 78.0–100.0)
MONO ABS: 0.6 10*3/uL (ref 0.1–1.0)
Monocytes Relative: 7 %
Neutro Abs: 4.5 10*3/uL (ref 1.7–7.7)
Neutrophils Relative %: 60 %
PLATELETS: 142 10*3/uL — AB (ref 150–400)
RBC: 4.51 MIL/uL (ref 3.87–5.11)
RDW: 12.9 % (ref 11.5–15.5)
WBC: 7.5 10*3/uL (ref 4.0–10.5)

## 2016-06-27 LAB — BASIC METABOLIC PANEL
Anion gap: 6 (ref 5–15)
BUN: 10 mg/dL (ref 6–20)
CALCIUM: 9.5 mg/dL (ref 8.9–10.3)
CO2: 30 mmol/L (ref 22–32)
Chloride: 104 mmol/L (ref 101–111)
Creatinine, Ser: 0.59 mg/dL (ref 0.44–1.00)
GFR calc Af Amer: >60 mL/min (ref >60)
GLUCOSE: 103 mg/dL — AB (ref 65–99)
Potassium: 3.4 mmol/L — ABNORMAL LOW (ref 3.5–5.1)
Sodium: 140 mmol/L (ref 135–145)

## 2016-06-27 MED ORDER — MIRABEGRON ER 25 MG PO TB24: 25.0000 mg | ORAL_TABLET | Freq: Every day | ORAL | 11 refills | Status: DC

## 2016-06-27 MED ORDER — DIAZEPAM 2 MG PO TABS
2.0000 mg | ORAL_TABLET | Freq: Once | ORAL | Status: AC
  Administered 2016-06-27: 2 mg via ORAL
  Filled 2016-06-27: qty 1

## 2016-06-27 MED ORDER — ISOSORB DINITRATE-HYDRALAZINE 20-37.5 MG PO TABS
0.5000 | ORAL_TABLET | Freq: Once | ORAL | Status: AC
  Administered 2016-06-27: 0.5 via ORAL
  Filled 2016-06-27: qty 0.5

## 2016-06-27 MED ORDER — CHLORTHALIDONE 25 MG PO TABS: 25.0000 mg | ORAL_TABLET | Freq: Every day | ORAL | 11 refills | Status: DC

## 2016-06-27 NOTE — Telephone Encounter (Signed)
Daughter called back.  Told her to have patient here at 4:15pm.

## 2016-06-27 NOTE — Telephone Encounter (Signed)
Pt daughter called in and said that pt was in the ER yesterday and BP Is really high.  She stated that ER advised that she needed to be seen today by her primary. They are wanting to know if you are able to work her in today?    [phone]- daughter [phone]- Granddaughter not on hippa but will be with the daughter today who is on the hippa

## 2016-06-27 NOTE — Progress Notes (Signed)
Subjective:  Patient ID: [name], female    DOB: [birth year]  Age: 82 y.o. MRN: [id number]  CC: Hypertension   HPI CHANTEL NORR presents for concerns about elevated blood pressure. She is with 2 of her daughters today and since I last saw her her blood pressure has been as high as 240/200 and 245/110. The daughters are concerned that her mother is forgetting to take her blood pressure medications and that she may be abusing her other medications such as the benzodiazepine and the opiate. The patient agrees that she hides her medicines around her house because she is afraid that people might steal them. She has had a few episodes of dizziness but she denies headache/chest pain/blurred vision/shortness of breath/edema/palpitations/or fatigue.  Outpatient Medications Prior to Visit  Medication Sig Dispense Refill  . aspirin EC 325 MG tablet Take 325 mg by mouth daily.    . calcium carbonate (OS-CAL) 600 MG TABS tablet Take 600 mg by mouth 2 (two) times daily with a meal.    . carvedilol (COREG) 6.25 MG tablet Take 1 tablet (6.25 mg total) by mouth 2 (two) times daily with a meal. 60 tablet 11  . cholecalciferol (VITAMIN D) 1000 units tablet Take 2,000 Units by mouth daily.    . isosorbide-hydrALAZINE (BIDIL) 20-37.5 MG tablet Take 0.5 tablets by mouth 2 (two) times daily. 30 tablet 11  . meclizine (ANTIVERT) 25 MG tablet Take 25 mg by mouth 3 (three) times daily as needed for dizziness.    Marland Kitchen omeprazole (PRILOSEC) 40 MG capsule Take 1 capsule (40 mg total) by mouth daily. 30 capsule 11  . oxyCODONE-acetaminophen (PERCOCET) 10-325 MG tablet Take 1 tablet by mouth every 6 (six) hours as needed for pain. 120 tablet 0  . traZODone (DESYREL) 100 MG tablet Take 200 mg by mouth at bedtime as needed for sleep.    . clonazePAM (KLONOPIN) 1 MG tablet Take 1 tablet (1 mg total) by mouth 2 (two) times daily as needed for anxiety. 60 tablet 5  . conjugated estrogens (PREMARIN) vaginal cream Place 1  Applicatorful vaginally at bedtime as needed (for dryness).    . promethazine (PHENERGAN) 12.5 MG tablet Take 1 tablet (12.5 mg total) by mouth every 6 (six) hours as needed for nausea. 30 tablet 2   No facility-administered medications prior to visit.     ROS Review of Systems  Constitutional: Negative.  Negative for activity change, appetite change, chills, diaphoresis and fatigue.  HENT: Negative.  Negative for trouble swallowing.   Eyes: Negative for visual disturbance.  Respiratory: Negative.  Negative for cough, choking, chest tightness, shortness of breath and stridor.   Cardiovascular: Negative for chest pain, palpitations and leg swelling.  Gastrointestinal: Positive for constipation. Negative for diarrhea, nausea and vomiting.  Endocrine: Negative.   Genitourinary: Positive for frequency. Negative for dysuria, flank pain, hematuria and urgency.       She complains that she urinates every few hours throughout the day and also complains of nocturia  Musculoskeletal: Positive for back pain.  Skin: Negative.   Allergic/Immunologic: Negative.   Neurological: Positive for dizziness. Negative for facial asymmetry, weakness, light-headedness and headaches.  Hematological: Negative.  Negative for adenopathy. Does not bruise/bleed easily.  Psychiatric/Behavioral: Negative.     Objective:  BP (!) 162/118   Pulse 75   Temp 98.2 F (36.8 C) (Oral)   Resp 16   Wt 206 lb (93.4 kg)   SpO2 95%   BMI 37.68 kg/m   BP  Readings from Last 3 Encounters:  06/28/16 140/90  06/27/16 (!) 162/118  06/27/16 144/65    Wt Readings from Last 3 Encounters:  06/27/16 206 lb (93.4 kg)  06/11/16 208 lb (94.3 kg)  05/15/16 208 lb (94.3 kg)    Physical Exam  Constitutional: She is oriented to person, place, and time. She appears well-developed and well-nourished. No distress.  HENT:  Mouth/Throat: Oropharynx is clear and moist. No oropharyngeal exudate.  Eyes: Conjunctivae are normal. Right  eye exhibits no discharge. Left eye exhibits no discharge. No scleral icterus.  Neck: Normal range of motion. Neck supple. No JVD present. No tracheal deviation present. No thyromegaly present.  Cardiovascular: Normal rate, regular rhythm, normal heart sounds and intact distal pulses.  Exam reveals no gallop and no friction rub.   No murmur heard. Pulmonary/Chest: Effort normal and breath sounds normal. No stridor. No respiratory distress. She has no wheezes. She has no rales. She exhibits no tenderness.  Abdominal: Soft. Bowel sounds are normal. She exhibits no distension and no mass. There is no tenderness. There is no rebound and no guarding.  Musculoskeletal: Normal range of motion. She exhibits no edema, tenderness or deformity.  Lymphadenopathy:    She has no cervical adenopathy.  Neurological: She is oriented to person, place, and time.  Skin: Skin is warm and dry. No rash noted. She is not diaphoretic. No erythema. No pallor.  Psychiatric: Judgment and thought content normal. Her mood appears anxious. She is not slowed and not withdrawn. Cognition and memory are not impaired. She does not exhibit a depressed mood. She expresses no homicidal and no suicidal ideation. She expresses no suicidal plans and no homicidal plans. She exhibits abnormal recent memory. She exhibits normal remote memory. She is inattentive.  Vitals reviewed.   Lab Results  Component Value Date   WBC 7.5 06/26/2016   HGB 13.5 06/26/2016   HCT 42.0 06/26/2016   PLT 142 (L) 06/26/2016   GLUCOSE 103 (H) 06/26/2016   CHOL 222 (H) 06/11/2016   TRIG 279.0 (H) 06/11/2016   HDL 54.20 06/11/2016   LDLDIRECT 114.0 06/11/2016   LDLCALC 106 (H) 05/16/2015   ALT 9 08/31/2015   AST 12 08/31/2015   NA 140 06/26/2016   K 3.4 (L) 06/26/2016   CL 104 06/26/2016   CREATININE 0.59 06/26/2016   BUN 10 06/26/2016   CO2 30 06/26/2016   TSH 3.80 06/11/2016   INR 1.01 03/10/2015   HGBA1C 5.0 05/16/2015    Mr Brain Wo  Contrast  Result Date: 06/27/2016 CLINICAL DATA:  Acute onset, gradually worsening intermittent dizziness beginning this afternoon. Hypertensive, LEFT facial droop. History of stroke, hypertension, hyperlipidemia, diabetes. EXAM: MRI HEAD WITHOUT CONTRAST TECHNIQUE: Multiplanar, multiecho pulse sequences of the brain and surrounding structures were obtained without intravenous contrast. COMPARISON:  CT HEAD October 26, 2013 FINDINGS: INTRACRANIAL CONTENTS: No reduced diffusion to suggest acute ischemia. No susceptibility artifact to suggest hemorrhage. The ventricles and sulci are normal for patient's age. Patchy supratentorial white matter FLAIR T2 hyperintensities compatible with mild to moderate chronic small vessel ischemic disease, less than expected for age. No suspicious parenchymal signal, masses or mass effect. Old small bilateral cerebellar infarcts. No abnormal extra-axial fluid collections. No extra-axial masses though, contrast enhanced sequences would be more sensitive. Normal major intracranial vascular flow voids present at skull base. ORBITS: The included ocular globes and orbital contents are non-suspicious. Status post bilateral ocular lens implants. SINUSES: Rack maxillary mucosal retention cyst. Mastoid air cells are well aerated. SKULL/SOFT  TISSUES: Empty sella. No suspicious calvarial bone marrow signal. Craniocervical junction maintained. Patient is edentulous. IMPRESSION: No acute intracranial process. Involutional changes. Mild to moderate chronic small vessel ischemic disease. Old bilateral small cerebellar infarcts. Empty sella. Electronically Signed   By: Elon Alas M.D.   On: 06/27/2016 04:27    Assessment & Plan:   Naydene was seen today for hypertension.  Diagnoses and all orders for this visit:  OAB (overactive bladder)- her UA and urine culture are negative for any urinary pathology, will treat the symptoms with Myrbetriq. -     mirabegron ER (MYRBETRIQ) 25 MG TB24  tablet; Take 1 tablet (25 mg total) by mouth daily. -     Urinalysis, Routine w reflex microscopic (not at [facility]); Future -     CULTURE, URINE COMPREHENSIVE; Future  Essential hypertension- her blood pressure is not adequately well controlled on the current regimen so I will add a thiazide diuretic. -     Ambulatory referral to [place] -     chlorthalidone (HYGROTON) 25 MG tablet; Take 1 tablet (25 mg total) by mouth daily.  Dementia arising in the senium and presenium- I am concerned that the Phenergan and benzodiazepine may be damaging her memory so I have asked her to stop taking those and she agrees with this recommendation, and also asked home health care to see her to help her organize and manage her medications. -     Ambulatory referral to [place]   I have discontinued Ms. Turnbo's promethazine, clonazePAM, and conjugated estrogens. I am also having her start on mirabegron ER and chlorthalidone. Additionally, I am having her maintain her calcium carbonate, omeprazole, oxyCODONE-acetaminophen, carvedilol, isosorbide-hydrALAZINE, cholecalciferol, aspirin EC, traZODone, and meclizine.  Meds ordered this encounter  Medications  . mirabegron ER (MYRBETRIQ) 25 MG TB24 tablet    Sig: Take 1 tablet (25 mg total) by mouth daily.    Dispense:  30 tablet    Refill:  11  . chlorthalidone (HYGROTON) 25 MG tablet    Sig: Take 1 tablet (25 mg total) by mouth daily.    Dispense:  30 tablet    Refill:  11     Follow-up: Return in about 3 weeks (around 07/18/2016).  Scarlette Calico, MD

## 2016-06-27 NOTE — Telephone Encounter (Signed)
Yes, I can see her 

## 2016-06-27 NOTE — Patient Instructions (Signed)
Hypertension Hypertension, commonly called high blood pressure, is when the force of blood pumping through your arteries is too strong. Your arteries are the blood vessels that carry blood from your heart throughout your body. A blood pressure reading consists of a higher number over a lower number, such as 110/72. The higher number (systolic) is the pressure inside your arteries when your heart pumps. The lower number (diastolic) is the pressure inside your arteries when your heart relaxes. Ideally you want your blood pressure below 120/80. Hypertension forces your heart to work harder to pump blood. Your arteries may become narrow or stiff. Having untreated or uncontrolled hypertension can cause heart attack, stroke, kidney disease, and other problems. RISK FACTORS Some risk factors for high blood pressure are controllable. Others are not.  Risk factors you cannot control include:   Race. You may be at higher risk if you are African American.  Age. Risk increases with age.  Gender. Men are at higher risk than women before age 45 years. After age 65, women are at higher risk than men. Risk factors you can control include:  Not getting enough exercise or physical activity.  Being overweight.  Getting too much fat, sugar, calories, or salt in your diet.  Drinking too much alcohol. SIGNS AND SYMPTOMS Hypertension does not usually cause signs or symptoms. Extremely high blood pressure (hypertensive crisis) may cause headache, anxiety, shortness of breath, and nosebleed. DIAGNOSIS To check if you have hypertension, your health care provider will measure your blood pressure while you are seated, with your arm held at the level of your heart. It should be measured at least twice using the same arm. Certain conditions can cause a difference in blood pressure between your right and left arms. A blood pressure reading that is higher than normal on one occasion does not mean that you need treatment. If  it is not clear whether you have high blood pressure, you may be asked to return on a different day to have your blood pressure checked again. Or, you may be asked to monitor your blood pressure at home for 1 or more weeks. TREATMENT Treating high blood pressure includes making lifestyle changes and possibly taking medicine. Living a healthy lifestyle can help lower high blood pressure. You may need to change some of your habits. Lifestyle changes may include:  Following the DASH diet. This diet is high in fruits, vegetables, and whole grains. It is low in salt, red meat, and added sugars.  Keep your sodium intake below 2,300 mg per day.  Getting at least 30-45 minutes of aerobic exercise at least 4 times per week.  Losing weight if necessary.  Not smoking.  Limiting alcoholic beverages.  Learning ways to reduce stress. Your health care provider may prescribe medicine if lifestyle changes are not enough to get your blood pressure under control, and if one of the following is true:  You are 18-59 years of age and your systolic blood pressure is above 140.  You are 60 years of age or older, and your systolic blood pressure is above 150.  Your diastolic blood pressure is above 90.  You have diabetes, and your systolic blood pressure is over 140 or your diastolic blood pressure is over 90.  You have kidney disease and your blood pressure is above 140/90.  You have heart disease and your blood pressure is above 140/90. Your personal target blood pressure may vary depending on your medical conditions, your age, and other factors. HOME CARE INSTRUCTIONS    Have your blood pressure rechecked as directed by your health care provider.   Take medicines only as directed by your health care provider. Follow the directions carefully. Blood pressure medicines must be taken as prescribed. The medicine does not work as well when you skip doses. Skipping doses also puts you at risk for  problems.  Do not smoke.   Monitor your blood pressure at home as directed by your health care provider. SEEK MEDICAL CARE IF:   You think you are having a reaction to medicines taken.  You have recurrent headaches or feel dizzy.  You have swelling in your ankles.  You have trouble with your vision. SEEK IMMEDIATE MEDICAL CARE IF:  You develop a severe headache or confusion.  You have unusual weakness, numbness, or feel faint.  You have severe chest or abdominal pain.  You vomit repeatedly.  You have trouble breathing. MAKE SURE YOU:   Understand these instructions.  Will watch your condition.  Will get help right away if you are not doing well or get worse.   This information is not intended to replace advice given to you by your health care provider. Make sure you discuss any questions you have with your health care provider.   Document Released: 11/11/2005 Document Revised: 03/28/2015 Document Reviewed: 09/03/2013 Elsevier Interactive Patient Education 2016 [employer]  

## 2016-06-27 NOTE — Discharge Instructions (Signed)
°  All the results in the ER are normal, labs and imaging. . The workup in the ER is not complete, and is limited to screening for life threatening and emergent conditions only, so please see a primary care doctor for further evaluation.

## 2016-06-27 NOTE — ED Provider Notes (Signed)
Patient is here from [place] to get an MRI to make sure she is not having a stroke. She reports she took her blood pressure multiple times using her home blood pressure monitors last night and all of the readings were elevated. Pt notes recent dizziness with ambulating as well; she states she is not dizzy currently and feels fine. Pt was administered a beta blocker and Vicodin at [facility].  If MRI neg, pt will be discharged.   Varney Biles, MD 06/29/16 [phone]

## 2016-06-27 NOTE — ED Notes (Signed)
Patient transported to MRI 

## 2016-06-28 ENCOUNTER — Encounter: Payer: Self-pay | Admitting: Physical Therapy

## 2016-06-28 ENCOUNTER — Ambulatory Visit: Payer: Commercial Managed Care - HMO | Attending: Internal Medicine | Admitting: Physical Therapy

## 2016-06-28 VITALS — BP 140/90 | HR 72

## 2016-06-28 DIAGNOSIS — R42 Dizziness and giddiness: Secondary | ICD-10-CM | POA: Insufficient documentation

## 2016-06-28 DIAGNOSIS — R2681 Unsteadiness on feet: Secondary | ICD-10-CM | POA: Insufficient documentation

## 2016-06-28 NOTE — Therapy (Signed)
[facility] [address] [place], [place], [address] Phone: [phone]   Fax:  [phone]  Physical Therapy Treatment  Patient Details  Name: [name] MRN: [id number] Date of Birth: 1933/09/16 Referring Provider: Scarlette Calico, MD Velora Heckler)  Encounter Date: 06/28/2016      PT End of Session - 06/28/16 1230    Visit Number 1   Number of Visits 3  eval + 2 visits   Date for PT Re-Evaluation 07/28/16   Authorization Type [employer]   Authorization Time Period G Codes required   PT Start Time 1100   PT Stop Time 1206   PT Time Calculation (min) 66 min   Activity Tolerance Patient tolerated treatment well   Behavior During Therapy [facility] for tasks assessed/performed      Past Medical History:  Diagnosis Date  . Abdominal pain   . Anemia    NOS  . Anxiety   . Bruises easily   . Chronic diastolic CHF (congestive heart failure) ([place])    a. 06/2013 EF 65-70%.  . Chronic headaches    HISTORY OF   . Depression   . Diabetes mellitus    type II  DIET CONTROLLED  . Diarrhea   . Hepatitis    AGE 30S  . Hyperlipidemia   . Hypertension   . Mild aortic stenosis    a. 06/2013 Echo: EF 65-70%, mod LVH with focal basal hypertrophy, very mild AS.  Marland Kitchen Osteoarthritis   . Oxygen desaturation during sleep    USES 2 LITERS BEDTIME   VIA CPAP 06/2012 [facility]   . Shortness of breath    WITH EXERTION USES 2 L O2 BEDTIME  . Sleep apnea    CPAP WITH O2 2 LITERS 2013 ([facility])  . Wears dentures   . Wears glasses     Past Surgical History:  Procedure Laterality Date  . ABDOMINAL HYSTERECTOMY    . APPENDECTOMY    . CHOLECYSTECTOMY    . JOINT REPLACEMENT     LEFT KNEE   . KNEE ARTHROPLASTY Right 03/22/2015   Procedure: COMPUTER ASSISTED TOTAL KNEE ARTHROPLASTY;  Surgeon: Marybelle Killings, MD;  Location: [place];  Service: Orthopedics;  Laterality: Right;  . KNEE ARTHROSCOPY     RIGHT  . LEFT AND RIGHT HEART CATHETERIZATION  WITH CORONARY ANGIOGRAM N/A 10/28/2013   Procedure: LEFT AND RIGHT HEART CATHETERIZATION WITH CORONARY ANGIOGRAM;  Surgeon: Blane Ohara, MD;  Location: [facility] CATH LAB;  Service: Cardiovascular;  Laterality: N/A;  . LEFT HEART CATHETERIZATION WITH CORONARY ANGIOGRAM N/A 11/14/2014   Procedure: LEFT HEART CATHETERIZATION WITH CORONARY ANGIOGRAM;  Surgeon: Josue Hector, MD;  Location: [facility] CATH LAB;  Service: Cardiovascular;  Laterality: N/A;  . REVISION TOTAL KNEE ARTHROPLASTY  2011  . ROTATOR CUFF REPAIR  2010  . SHOULDER ARTHROSCOPY  09/17/2012   Procedure: ARTHROSCOPY SHOULDER;  Surgeon: Sharmon Revere, MD;  Location: [place];  Service: Orthopedics;  Laterality: Left;  . TONSILLECTOMY    . TONSILLECTOMY      Vitals:   06/28/16 1107  BP: 140/90  Pulse: 72        Subjective Assessment - 06/28/16 1057    Subjective "Well, I've stayed dizzy for so long until my doctor suggested I see the ear doctor.Marland KitchenMarland KitchenI thought you were the ear doctor."   Pertinent History PMH significant for: dementia, HTN, aortic stenosis, CHF (EF = 65-70%), OSA, GERD, Hashimoto's thyroiditis, lumbar spondylosis, lumbar radiculopathy, B TKR, osteopenia,  generalized anxiety disorder, depression,, chronic headache.   Diagnostic tests MRI of brain (06/27/16) No acute intracranial process. Involutional changes. Mild to moderate chronic small vessel ischemic disease. Old bilateral small cerebellar infarcts.   Patient Stated Goals "To stop being dizzy."   Currently in Pain? No/denies            [facility] PT Assessment - 06/28/16 0001      Assessment   Medical Diagnosis  Benign paroxysmal positional vertigo, unspecified laterality   Referring Provider Scarlette Calico, MD     Onset Date/Surgical Date 11/25/14     Precautions   Precautions Fall     Restrictions   Weight Bearing Restrictions No     Balance Screen   Has the patient fallen in the past 6 months Yes   How many times? 1   Has the patient had a decrease  in activity level because of a fear of falling?  Yes   Is the patient reluctant to leave their home because of a fear of falling?  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     Symptom Behavior   Type of Dizziness Imbalance   Frequency of Dizziness daily   Duration of Dizziness Pt unsure   Aggravating Factors Looking up to the ceiling;Forward bending   Relieving Factors Head stationary;Rest     Occulomotor Exam   Occulomotor Alignment Normal   Spontaneous Absent   Gaze-induced Left beating nystagmus with L gaze   Smooth Pursuits Saccades   Saccades Dysmetria   Comment Saccades: hypometric from midline to R, from midline to inferior. Hypermetric from midline to superior.     Vestibulo-Occular Reflex   VOR 1 Head Only (x 1 viewing) Symptomatic with VOR with horizontal head movement   VOR Cancellation Corrective saccades   Comment (+) and symptomatic L Head Thrust Test.  Convergence impaired. and symptomatic.     Positional Testing   Sidelying Test Sidelying Right;Sidelying Left   Horizontal Canal Testing Horizontal Canal Right;Horizontal Canal Left     Sidelying Right   Sidelying Right Duration NA   Sidelying Right Symptoms No nystagmus     Sidelying Left   Sidelying Left Duration NA   Sidelying Left Symptoms No nystagmus     Horizontal Canal Right   Horizontal  Canal Right Duration NA   Horizontal Canal Right Symptoms Normal     Horizontal Canal Left   Horizontal Canal Left Duration NA   Horizontal Canal Left Symptoms Normal                 [facility] Adult PT Treatment/Exercise - 06/28/16 0001      Transfers   Transfers Sit to Stand;Stand to Sit   Sit to Stand 6: Modified independent (Device/Increase time)   Stand to Sit 6:  Modified independent (Device/Increase time)     Ambulation/Gait   Ambulation/Gait Yes   Ambulation/Gait Assistance 6: Modified independent (Device/Increase time)   Ambulation Distance (Feet) 200 Feet   Assistive device Straight cane   Ambulation Surface Level;Indoor         Vestibular Treatment/Exercise - 06/28/16 0001      Vestibular Treatment/Exercise   Vestibular Treatment Provided Gaze;Habituation   Habituation Exercises Standing Horizontal Head Turns;Standing Vertical Head Turns   Gaze Exercises X1 Viewing Horizontal     Standing Horizontal Head Turns   Number of Reps  10   Symptom Description  narrow BOS; increased dizziness, decreased postural stability     Standing Vertical Head Turns   Number of Reps  10   Symptom Description  narrow BOS     X1 Viewing Horizontal   Foot Position seated   Reps 2   Comments 2 x20 reps with cueing for technique, amplitude of head movement            Balance Exercises - 06/28/16 1226      Balance Exercises: Standing   Standing Eyes Opened Wide (BOA);Head turns;Foam/compliant surface;Other reps (comment)  1 pillow; horizontal, vertical head turns x10 each   Standing Eyes Closed Wide (BOA);Head turns;Foam/compliant surface;Other reps (comment)  1 pillow; horizontal, vertical head turns x10 each           PT Education - 06/28/16 1222    Education provided Yes   Education Details PT eval findings, goals, and POC. Initiated vestibular HEP. Strongly recommended pt bring family member to all sessions due to memory impairments to maximize pt carryover.     Person(s) Educated Patient   Methods Demonstration;Explanation;Handout;Verbal cues   Comprehension Verbalized understanding;Returned demonstration          PT Short Term Goals - 06/28/16 1228      PT SHORT TERM GOAL #1   Title STG's = LTG's     PT SHORT TERM GOAL #2   Title --           PT Long Term Goals - 06/28/16 1240      PT LONG TERM GOAL #1   Title Pt will perform vestibular HEP with mod I using paper handout to maximize functional gains made in PT.  (Target date: 07/26/16)     PT LONG TERM GOAL #2   Title DHI score will improve from 86 to 68 to indicate decreased pt-perceived disability due to dizziness.  (07/26/16)               Plan - 06/28/16 1232    Clinical Impression Statement Pt is an 82 y/o F referred to outpatient PT to address dizziness. PMH significant for: dementia, HTN, aortic stenosis, CHF (EF = 65-70%), OSA, GERD, Hashimoto's thyroiditis, lumbar spondylosis, lumbar radiculopathy, B TKR, osteopenia, generalized anxiety disorder, depression,, chronic headache.  PT evaluation reveals the following impairments: impaired oculomotor coordination, convergence insufficiency, saccadic smooth pursuits, impaired VOR, impaired and symptomatic VOR Cancellation, (+) and symptomatic L Head Thrust Test, and L gaze-evoked nystagmus. All positional testing negative and asymptomatic, suggesting no presence of BPPV at this time. Based on findings, unable to rule out vestibular hypofunction; and motion sensitivity due to impaired visual-vestibular integration due to central vestibular impairments (note old, small B cerebellar infarcts on recent MRI of brain). Recommending outpatient PT 2x/week for 4 weeks to address said impairments. However, pt requesting up to 2 PT visits over the next 4 weeks due to high insurance co-pay.  Rehab Potential Fair   Clinical Impairments Affecting Rehab Potential h/o dementia; lack of family support (lives alone, difficulty with  transportation); high insurance co-pay   PT Frequency --  2 total sessions   PT Duration 4 weeks   PT Treatment/Interventions ADLs/Self Care Home Management;Canalith Repostioning;Patient/family education;Therapeutic exercise;Therapeutic activities;Balance training;Neuromuscular re-education;Vestibular;Gait training;Stair training;Functional mobility training;DME Instruction   PT Next Visit Plan Assess current vestibular HEP and progress prn. DC vs. recert.   Consulted and Agree with Plan of Care Patient;Family member/caregiver   Family Member Consulted niece, Cassandra      Patient will benefit from skilled therapeutic intervention in order to improve the following deficits and impairments:  Dizziness, Decreased balance  Visit Diagnosis: Dizziness and giddiness - Plan: PT plan of care cert/re-cert  Unsteadiness on feet - Plan: PT plan of care cert/re-cert       G-Codes - 07/27/2016 1227    Functional Assessment Tool Used DHI = 86   Functional Limitation Self care   Self Care Current Status [id number]) At least 80 percent but less than 100 percent impaired, limited or restricted   Self Care Goal Status [id number]) At least 60 percent but less than 80 percent impaired, limited or restricted      Problem List Patient Active Problem List   Diagnosis Date Noted  . Dementia arising in the senium and presenium 06/27/2016  . Obesity, Class II, BMI 35-39.9, with comorbidity ([place]) 05/15/2016  . Insomnia w/ sleep apnea 10/16/2015  . Arthralgia 08/31/2015  . Other constipation 03/16/2015  . IBS (irritable bowel syndrome) 02/27/2015  . Lumbar spondylosis 02/10/2015  . Lumbar radiculopathy 02/10/2015  . Osteoarthritis of right knee 02/10/2015  . Hashimoto's thyroiditis 05/05/2014  . Hyperglycemia 02/21/2014  . Adrenal incidentaloma ([place]) 11/04/2013  . Benign paroxysmal positional vertigo 07/15/2013  . Aortic stenosis, mild 07/14/2013  . OAB (overactive bladder) 02/24/2013  . GERD with  stricture 12/24/2012  . Sleep-related hypoventilation 07/30/2012  . OSA (obstructive sleep apnea) 04/16/2012  . Osteopenia 04/16/2012  . Preventative health care 05/24/2011  . VITAMIN D DEFICIENCY 12/04/2010  . Hyperlipidemia with target LDL less than 130 12/03/2010  . GAD (generalized anxiety disorder) 12/03/2010  . DEPRESSION 12/03/2010  . Essential hypertension 12/03/2010   Billie Ruddy, PT, DPT [facility] [address] [place], [place], [address] Phone: [phone]   Fax:  [phone] 27-Jul-2016, 12:44 PM  Name: [name] MRN: [id number] Date of Birth: August 31, 1933

## 2016-06-28 NOTE — Patient Instructions (Signed)
Gaze Stabilization: Sitting    Wear your eyeglasses for this exercise. Keeping eyes on target ("A") at eye-level, 5 feet away, tilt head down slightly and move head side 20 times. Do __2__ sessions per day.  Gaze Stabilization: Tip Card  1.Target must remain in focus, not blurry, and appear stationary while head is in motion. 2.Perform exercises with small head movements (45 to either side of midline). 3.Increase speed of head motion so long as target is in focus.     Feet Together (Compliant Surface) Head Motion      Stand with your back to a corner with a stable chair in front of you. Stand on 1 pillow with your feet shoulder-width apart.  1. With eyes open, move head slowly: up and down 10 times; right to left 10 times. 2. With eyes closed, move head slowly: up and down 10 times; right to left 10 times.  Repeat __2-3_ times per day.  Copyright  VHI. All rights reserved.

## 2016-06-29 ENCOUNTER — Encounter: Payer: Self-pay | Admitting: Internal Medicine

## 2016-06-29 LAB — CULTURE, URINE COMPREHENSIVE

## 2016-07-01 DIAGNOSIS — I10 Essential (primary) hypertension: Secondary | ICD-10-CM | POA: Diagnosis not present

## 2016-07-01 DIAGNOSIS — F329 Major depressive disorder, single episode, unspecified: Secondary | ICD-10-CM | POA: Diagnosis not present

## 2016-07-01 DIAGNOSIS — E785 Hyperlipidemia, unspecified: Secondary | ICD-10-CM | POA: Diagnosis not present

## 2016-07-01 DIAGNOSIS — F419 Anxiety disorder, unspecified: Secondary | ICD-10-CM | POA: Diagnosis not present

## 2016-07-01 DIAGNOSIS — M4724 Other spondylosis with radiculopathy, thoracic region: Secondary | ICD-10-CM | POA: Diagnosis not present

## 2016-07-01 DIAGNOSIS — F0390 Unspecified dementia without behavioral disturbance: Secondary | ICD-10-CM | POA: Diagnosis not present

## 2016-07-01 DIAGNOSIS — K589 Irritable bowel syndrome without diarrhea: Secondary | ICD-10-CM | POA: Diagnosis not present

## 2016-07-01 DIAGNOSIS — E669 Obesity, unspecified: Secondary | ICD-10-CM | POA: Diagnosis not present

## 2016-07-01 DIAGNOSIS — K219 Gastro-esophageal reflux disease without esophagitis: Secondary | ICD-10-CM | POA: Diagnosis not present

## 2016-07-04 DIAGNOSIS — M4724 Other spondylosis with radiculopathy, thoracic region: Secondary | ICD-10-CM | POA: Diagnosis not present

## 2016-07-04 DIAGNOSIS — F419 Anxiety disorder, unspecified: Secondary | ICD-10-CM | POA: Diagnosis not present

## 2016-07-04 DIAGNOSIS — K589 Irritable bowel syndrome without diarrhea: Secondary | ICD-10-CM | POA: Diagnosis not present

## 2016-07-04 DIAGNOSIS — F329 Major depressive disorder, single episode, unspecified: Secondary | ICD-10-CM | POA: Diagnosis not present

## 2016-07-04 DIAGNOSIS — F0390 Unspecified dementia without behavioral disturbance: Secondary | ICD-10-CM | POA: Diagnosis not present

## 2016-07-04 DIAGNOSIS — I10 Essential (primary) hypertension: Secondary | ICD-10-CM | POA: Diagnosis not present

## 2016-07-04 DIAGNOSIS — K219 Gastro-esophageal reflux disease without esophagitis: Secondary | ICD-10-CM | POA: Diagnosis not present

## 2016-07-04 DIAGNOSIS — E785 Hyperlipidemia, unspecified: Secondary | ICD-10-CM | POA: Diagnosis not present

## 2016-07-04 DIAGNOSIS — E669 Obesity, unspecified: Secondary | ICD-10-CM | POA: Diagnosis not present

## 2016-07-11 DIAGNOSIS — M4724 Other spondylosis with radiculopathy, thoracic region: Secondary | ICD-10-CM | POA: Diagnosis not present

## 2016-07-11 DIAGNOSIS — K589 Irritable bowel syndrome without diarrhea: Secondary | ICD-10-CM | POA: Diagnosis not present

## 2016-07-11 DIAGNOSIS — F0390 Unspecified dementia without behavioral disturbance: Secondary | ICD-10-CM | POA: Diagnosis not present

## 2016-07-11 DIAGNOSIS — I10 Essential (primary) hypertension: Secondary | ICD-10-CM | POA: Diagnosis not present

## 2016-07-11 DIAGNOSIS — K219 Gastro-esophageal reflux disease without esophagitis: Secondary | ICD-10-CM | POA: Diagnosis not present

## 2016-07-11 DIAGNOSIS — E785 Hyperlipidemia, unspecified: Secondary | ICD-10-CM | POA: Diagnosis not present

## 2016-07-11 DIAGNOSIS — E669 Obesity, unspecified: Secondary | ICD-10-CM | POA: Diagnosis not present

## 2016-07-11 DIAGNOSIS — F329 Major depressive disorder, single episode, unspecified: Secondary | ICD-10-CM | POA: Diagnosis not present

## 2016-07-11 DIAGNOSIS — F419 Anxiety disorder, unspecified: Secondary | ICD-10-CM | POA: Diagnosis not present

## 2016-07-17 ENCOUNTER — Ambulatory Visit: Payer: Commercial Managed Care - HMO | Admitting: Physical Therapy

## 2016-07-17 DIAGNOSIS — R2681 Unsteadiness on feet: Secondary | ICD-10-CM

## 2016-07-17 DIAGNOSIS — R42 Dizziness and giddiness: Secondary | ICD-10-CM | POA: Diagnosis not present

## 2016-07-17 NOTE — Therapy (Signed)
[facility] [address] [place], [place], [address] Phone: [phone]   Fax:  [phone]  Physical Therapy Treatment  Patient Details  Name: [name] MRN: [id number] Date of Birth: Jun 26, 1933 Referring Provider: Scarlette Calico, MD Velora Heckler)  Encounter Date: 07/17/2016      PT End of Session - 07/17/16 1715    Visit Number 2   Number of Visits 3   Date for PT Re-Evaluation 07/28/16   Authorization Type [employer]   Authorization Time Period G Codes required   PT Start Time 1404   PT Stop Time 1443   PT Time Calculation (min) 39 min   Activity Tolerance Patient tolerated treatment well   Behavior During Therapy [facility] for tasks assessed/performed      Past Medical History:  Diagnosis Date  . Abdominal pain   . Anemia    NOS  . Anxiety   . Bruises easily   . Chronic diastolic CHF (congestive heart failure) ([place])    a. 06/2013 EF 65-70%.  . Chronic headaches    HISTORY OF   . Depression   . Diabetes mellitus    type II  DIET CONTROLLED  . Diarrhea   . Hepatitis    AGE 32S  . Hyperlipidemia   . Hypertension   . Mild aortic stenosis    a. 06/2013 Echo: EF 65-70%, mod LVH with focal basal hypertrophy, very mild AS.  Marland Kitchen Osteoarthritis   . Oxygen desaturation during sleep    USES 2 LITERS BEDTIME   [facility] CPAP [facility]   . Shortness of breath    WITH EXERTION USES 2 L O2 BEDTIME  . Sleep apnea    CPAP WITH O2 2 LITERS 2013 ([facility])  . Wears dentures   . Wears glasses     Past Surgical History:  Procedure Laterality Date  . ABDOMINAL HYSTERECTOMY    . APPENDECTOMY    . CHOLECYSTECTOMY    . JOINT REPLACEMENT     LEFT KNEE   . KNEE ARTHROPLASTY Right 03/22/2015   Procedure: COMPUTER ASSISTED TOTAL KNEE ARTHROPLASTY;  Surgeon: Marybelle Killings, MD;  Location: [place];  Service: Orthopedics;  Laterality: Right;  . KNEE ARTHROSCOPY     RIGHT  . LEFT AND RIGHT HEART CATHETERIZATION WITH CORONARY  ANGIOGRAM N/A 10/28/2013   Procedure: LEFT AND RIGHT HEART CATHETERIZATION WITH CORONARY ANGIOGRAM;  Surgeon: Blane Ohara, MD;  Location: [facility];  Service: Cardiovascular;  Laterality: N/A;  . LEFT HEART CATHETERIZATION WITH CORONARY ANGIOGRAM N/A 11/14/2014   Procedure: LEFT HEART CATHETERIZATION WITH CORONARY ANGIOGRAM;  Surgeon: Josue Hector, MD;  Location: [facility];  Service: Cardiovascular;  Laterality: N/A;  . REVISION TOTAL KNEE ARTHROPLASTY  2011  . ROTATOR CUFF REPAIR  2010  . SHOULDER ARTHROSCOPY  09/17/2012   Procedure: ARTHROSCOPY SHOULDER;  Surgeon: Sharmon Revere, MD;  Location: [place];  Service: Orthopedics;  Laterality: Left;  . TONSILLECTOMY    . TONSILLECTOMY      There were no vitals filed for this visit.      Subjective Assessment - 07/17/16 1711    Subjective "I'm actually feeling a lot better. I'm not really dizzy that much anymore.Marland KitchenMarland KitchenI think the exercises might be helping."   Pertinent History PMH significant for: dementia, HTN, aortic stenosis, CHF (EF = 65-70%), OSA, GERD, Hashimoto's thyroiditis, lumbar spondylosis, lumbar radiculopathy, B TKR, osteopenia, generalized anxiety disorder, depression,, chronic headache.   Diagnostic tests MRI of brain (  06/27/16) No acute intracranial process. Involutional changes. Mild to moderate chronic small vessel ischemic disease. Old bilateral small cerebellar infarcts.   Patient Stated Goals "To stop being dizzy."   Currently in Pain? No/denies                         [facility] Adult PT Treatment/Exercise - 07/17/16 0001      Ambulation/Gait   Ambulation/Gait Yes   Ambulation/Gait Assistance 5: Supervision;6: Modified independent (Device/Increase time)   Ambulation/Gait Assistance Details Mod I with SPC, (S) for gait without SPC when turning head or body   Ambulation Distance (Feet) 250 Feet   Assistive device Straight cane;None   Ambulation Surface Level;Indoor         Vestibular  Treatment/Exercise - 07/17/16 0001      Vestibular Treatment/Exercise   Vestibular Treatment Provided Gaze     X1 Viewing Horizontal   Foot Position seated; then standing with feet shoulder width apart  5' away from visual target due to imp. convergence   Reps 2   Comments x20 reps in seated; x30 reps in standing  mod cueing for technique            Balance Exercises - 07/17/16 1713      Balance Exercises: Standing   Standing Eyes Opened Wide (BOA);Head turns;Foam/compliant surface;Other reps (comment)  1 pillow; horizontal, vertical head turns x10 each   Standing Eyes Closed Wide (BOA);Head turns;Foam/compliant surface;Other reps (comment)  1 pillow; horizontal, vertical head turns x10 each   Gait with Head Turns Forward;Intermittent upper extremity support;4 reps;Other (comment)  59' x2 with horiz. head turns, x2 with vertical head turns   Retro Gait 4 reps;Head turns;Other (comment)  4 x10' w/ intermittent UE support at countertop   Other Standing Exercises Pt required min cueing for technique with corner balance HEP.           PT Education - 07/17/16 1711    Education provided Yes   Education Details HEP modified/progressed; see Pt Instructions.    Person(s) Educated Patient   Methods Explanation;Demonstration;Verbal cues;Handout   Comprehension Verbalized understanding;Need further instruction;Verbal cues required  May require reinforcement at future session          PT Short Term Goals - 06/28/16 1228      PT SHORT TERM GOAL #1   Title STG's = LTG's     PT SHORT TERM GOAL #2   Title --           PT Long Term Goals - 06/28/16 1240      PT LONG TERM GOAL #1   Title Pt will perform vestibular HEP with mod I using paper handout to maximize functional gains made in PT.  (Target date: 07/26/16)     PT LONG TERM GOAL #2   Title DHI score will improve from 86 to 68 to indicate decreased pt-perceived disability due to dizziness.  (07/26/16)                Plan - 07/17/16 1716    Clinical Impression Statement Pt arrived to session with report of significantly less dizziness, improved balance confidence since initial evaluation on 8/4. Pt has been compliant with HEP, but did require min cueing for technique (no paper handout used). Progressed HEP due to improved postural stability with corner balance exercises and increased tolerance to x1 viewing.    Rehab Potential Fair   Clinical Impairments Affecting Rehab Potential h/o dementia; lack of family support (lives  alone, difficulty with transportation); high insurance co-pay   PT Frequency Other (comment)  2 total sessions   PT Duration 4 weeks   PT Treatment/Interventions ADLs/Self Care Home Management;Canalith Repostioning;Patient/family education;Therapeutic exercise;Therapeutic activities;Balance training;Neuromuscular re-education;Vestibular;Gait training;Stair training;Functional mobility training;DME Instruction   PT Next Visit Plan Assess current vestibular HEP and progress prn.  Assess DHI.  DC vs. recert.   Consulted and Agree with Plan of Care Patient      Patient will benefit from skilled therapeutic intervention in order to improve the following deficits and impairments:  Dizziness, Decreased balance  Visit Diagnosis: Dizziness and giddiness  Unsteadiness on feet     Problem List Patient Active Problem List   Diagnosis Date Noted  . Dementia arising in the senium and presenium 06/27/2016  . Obesity, Class II, BMI 35-39.9, with comorbidity ([place]) 05/15/2016  . Insomnia w/ sleep apnea 10/16/2015  . Arthralgia 08/31/2015  . Other constipation 03/16/2015  . IBS (irritable bowel syndrome) 02/27/2015  . Lumbar spondylosis 02/10/2015  . Lumbar radiculopathy 02/10/2015  . Osteoarthritis of right knee 02/10/2015  . Hashimoto's thyroiditis 05/05/2014  . Hyperglycemia 02/21/2014  . Adrenal incidentaloma ([place]) 11/04/2013  . Benign paroxysmal positional vertigo  07/15/2013  . Aortic stenosis, mild 07/14/2013  . OAB (overactive bladder) 02/24/2013  . GERD with stricture 12/24/2012  . Sleep-related hypoventilation 07/30/2012  . OSA (obstructive sleep apnea) 04/16/2012  . Osteopenia 04/16/2012  . Preventative health care 05/24/2011  . VITAMIN D DEFICIENCY 12/04/2010  . Hyperlipidemia with target LDL less than 130 12/03/2010  . GAD (generalized anxiety disorder) 12/03/2010  . DEPRESSION 12/03/2010  . Essential hypertension 12/03/2010   Billie Ruddy, PT, DPT [facility] [address] [place], [place], [address] Phone: [phone]   Fax:  [phone] 07/17/16, 5:19 PM  Name: [name] MRN: [id number] Date of Birth: 18-Oct-1933

## 2016-07-17 NOTE — Patient Instructions (Signed)
  Gaze Stabilization: Standing Feet Apart   Feet shoulder width apart, keeping eyes on target ("A") on wall 5 feet away, tilt head down slightly and move head side to side for 30 repetitions. Do 2 sessions per day.  Gaze Stabilization: Tip Card 1.Target  ("A") must remain in focus, not blurry, and appear stationary while head is in motion. 2.Perform exercises with small head movements (45 to either side of midline). 3.Increase speed of head motion so long as target is in focus.   Walking Head Turn    Perform this exercise in your narrow hallway. Walk with the wall close on your left side, cane in right hand. Walk the length of your hallway while turning head right to left (2 steps with head to the right, 2 steps with head to the left). Repeat while turning head up to down. Touch wall if necessary to keep balance.   Copyright  VHI. All rights reserved.

## 2016-07-18 DIAGNOSIS — E785 Hyperlipidemia, unspecified: Secondary | ICD-10-CM | POA: Diagnosis not present

## 2016-07-18 DIAGNOSIS — I10 Essential (primary) hypertension: Secondary | ICD-10-CM | POA: Diagnosis not present

## 2016-07-18 DIAGNOSIS — K589 Irritable bowel syndrome without diarrhea: Secondary | ICD-10-CM | POA: Diagnosis not present

## 2016-07-18 DIAGNOSIS — F419 Anxiety disorder, unspecified: Secondary | ICD-10-CM | POA: Diagnosis not present

## 2016-07-18 DIAGNOSIS — F0390 Unspecified dementia without behavioral disturbance: Secondary | ICD-10-CM | POA: Diagnosis not present

## 2016-07-18 DIAGNOSIS — F329 Major depressive disorder, single episode, unspecified: Secondary | ICD-10-CM | POA: Diagnosis not present

## 2016-07-18 DIAGNOSIS — E669 Obesity, unspecified: Secondary | ICD-10-CM | POA: Diagnosis not present

## 2016-07-18 DIAGNOSIS — K219 Gastro-esophageal reflux disease without esophagitis: Secondary | ICD-10-CM | POA: Diagnosis not present

## 2016-07-18 DIAGNOSIS — M4724 Other spondylosis with radiculopathy, thoracic region: Secondary | ICD-10-CM | POA: Diagnosis not present

## 2016-07-24 DIAGNOSIS — E785 Hyperlipidemia, unspecified: Secondary | ICD-10-CM | POA: Diagnosis not present

## 2016-07-24 DIAGNOSIS — F419 Anxiety disorder, unspecified: Secondary | ICD-10-CM | POA: Diagnosis not present

## 2016-07-24 DIAGNOSIS — M4724 Other spondylosis with radiculopathy, thoracic region: Secondary | ICD-10-CM | POA: Diagnosis not present

## 2016-07-24 DIAGNOSIS — E669 Obesity, unspecified: Secondary | ICD-10-CM | POA: Diagnosis not present

## 2016-07-24 DIAGNOSIS — K219 Gastro-esophageal reflux disease without esophagitis: Secondary | ICD-10-CM | POA: Diagnosis not present

## 2016-07-24 DIAGNOSIS — K589 Irritable bowel syndrome without diarrhea: Secondary | ICD-10-CM | POA: Diagnosis not present

## 2016-07-24 DIAGNOSIS — I10 Essential (primary) hypertension: Secondary | ICD-10-CM | POA: Diagnosis not present

## 2016-07-24 DIAGNOSIS — F0390 Unspecified dementia without behavioral disturbance: Secondary | ICD-10-CM | POA: Diagnosis not present

## 2016-07-24 DIAGNOSIS — F329 Major depressive disorder, single episode, unspecified: Secondary | ICD-10-CM | POA: Diagnosis not present

## 2016-07-25 ENCOUNTER — Ambulatory Visit: Payer: Commercial Managed Care - HMO | Admitting: Physical Therapy

## 2016-07-25 DIAGNOSIS — R2681 Unsteadiness on feet: Secondary | ICD-10-CM

## 2016-07-25 DIAGNOSIS — R42 Dizziness and giddiness: Secondary | ICD-10-CM | POA: Diagnosis not present

## 2016-07-25 DIAGNOSIS — K589 Irritable bowel syndrome without diarrhea: Secondary | ICD-10-CM | POA: Diagnosis not present

## 2016-07-25 DIAGNOSIS — E669 Obesity, unspecified: Secondary | ICD-10-CM | POA: Diagnosis not present

## 2016-07-25 DIAGNOSIS — F329 Major depressive disorder, single episode, unspecified: Secondary | ICD-10-CM | POA: Diagnosis not present

## 2016-07-25 DIAGNOSIS — F419 Anxiety disorder, unspecified: Secondary | ICD-10-CM | POA: Diagnosis not present

## 2016-07-25 DIAGNOSIS — E785 Hyperlipidemia, unspecified: Secondary | ICD-10-CM | POA: Diagnosis not present

## 2016-07-25 DIAGNOSIS — K219 Gastro-esophageal reflux disease without esophagitis: Secondary | ICD-10-CM | POA: Diagnosis not present

## 2016-07-25 DIAGNOSIS — F0390 Unspecified dementia without behavioral disturbance: Secondary | ICD-10-CM | POA: Diagnosis not present

## 2016-07-25 DIAGNOSIS — M4724 Other spondylosis with radiculopathy, thoracic region: Secondary | ICD-10-CM | POA: Diagnosis not present

## 2016-07-25 DIAGNOSIS — I10 Essential (primary) hypertension: Secondary | ICD-10-CM | POA: Diagnosis not present

## 2016-07-25 NOTE — Therapy (Addendum)
[facility] [address] [place], [place], [address] Phone: [phone]   Fax:  [phone]  Physical Therapy Treatment  and Discharge Summary   Patient Details  Name: [name] MRN: [id number] Date of Birth: 1933-07-21 Referring Provider: Sanda Linger, MD Corinda Gub[place])  Encounter Date: 07/25/2016      PT End of Session - 07/25/16 1642    Visit Number 3   Number of Visits 3   Date for PT Re-Evaluation 07/28/16   Authorization Type [employer]   Authorization Time Period G Codes required   PT Start Time 1533   PT Stop Time 1620   PT Time Calculation (min) 47 min   Activity Tolerance Patient tolerated treatment well   Behavior During Therapy [facility] for tasks assessed/performed      Past Medical History:  Diagnosis Date  . Abdominal pain   . Anemia    NOS  . Anxiety   . Bruises easily   . Chronic diastolic CHF (congestive heart failure) (HCC)    a. 06/2013 EF 65-70%.  . Chronic headaches    HISTORY OF   . Depression   . Diabetes mellitus    type II  DIET CONTROLLED  . Diarrhea   . Hepatitis    AGE 30S  . Hyperlipidemia   . Hypertension   . Mild aortic stenosis    a. 06/2013 Echo: EF 65-70%, mod LVH with focal basal hypertrophy, very mild AS.  Marland Kitchen Osteoarthritis   . Oxygen desaturation during sleep    USES 2 LITERS BEDTIME   VIA CPAP 06/2012 [facility]   . Shortness of breath    WITH EXERTION USES 2 L O2 BEDTIME  . Sleep apnea    CPAP WITH O2 2 LITERS 2013 ([facility])  . Wears dentures   . Wears glasses     Past Surgical History:  Procedure Laterality Date  . ABDOMINAL HYSTERECTOMY    . APPENDECTOMY    . CHOLECYSTECTOMY    . JOINT REPLACEMENT     LEFT KNEE   . KNEE ARTHROPLASTY Right 03/22/2015   Procedure: COMPUTER ASSISTED TOTAL KNEE ARTHROPLASTY;  Surgeon: Eldred Manges, MD;  Location: [facility] OR;  Service: Orthopedics;  Laterality: Right;  . KNEE ARTHROSCOPY     RIGHT  . LEFT AND RIGHT HEART  CATHETERIZATION WITH CORONARY ANGIOGRAM N/A 10/28/2013   Procedure: LEFT AND RIGHT HEART CATHETERIZATION WITH CORONARY ANGIOGRAM;  Surgeon: Micheline Chapman, MD;  Location: [facility] CATH LAB;  Service: Cardiovascular;  Laterality: N/A;  . LEFT HEART CATHETERIZATION WITH CORONARY ANGIOGRAM N/A 11/14/2014   Procedure: LEFT HEART CATHETERIZATION WITH CORONARY ANGIOGRAM;  Surgeon: Wendall Stade, MD;  Location: [facility] CATH LAB;  Service: Cardiovascular;  Laterality: N/A;  . REVISION TOTAL KNEE ARTHROPLASTY  2011  . ROTATOR CUFF REPAIR  2010  . SHOULDER ARTHROSCOPY  09/17/2012   Procedure: ARTHROSCOPY SHOULDER;  Surgeon: Kennieth Rad, MD;  Location: [facility] OR;  Service: Orthopedics;  Laterality: Left;  . TONSILLECTOMY    . TONSILLECTOMY      There were no vitals filed for this visit.      Subjective Assessment - 07/25/16 1538    Subjective "I'll put it this way: I haven't noticed myself getting dizzy since I strated coming out here."   Pertinent History PMH significant for: dementia, HTN, aortic stenosis, CHF (EF = 65-70%), OSA, GERD, Hashimoto's thyroiditis, lumbar spondylosis, lumbar radiculopathy, B TKR, osteopenia, generalized anxiety disorder, depression,, chronic headache.   Diagnostic tests  MRI of brain (06/27/16) No acute intracranial process. Involutional changes. Mild to moderate chronic small vessel ischemic disease. Old bilateral small cerebellar infarcts.   Patient Stated Goals "To stop being dizzy."   Currently in Pain? No/denies                          Vestibular Treatment/Exercise - 08-13-16 0001      Vestibular Treatment/Exercise   Vestibular Treatment Provided Gaze   Gaze Exercises X1 Viewing Horizontal     X1 Viewing Horizontal   Foot Position standing; feet shoulder width apart   Reps 2   Comments 1 x30 seconds then 1 x45 seconds with cueing for technique            Balance Exercises - 08-13-16 1628      Balance Exercises: Standing   Gait with Head  Turns Forward;Intermittent upper extremity support;4 reps;Other (comment)  62' x2 with horiz. head turns, x2 with vertical head turns           PT Education - 08-13-2016 1643    Education provided Yes   Education Details PT goals, findings, progress, and DC plan. Educated pt on [place].   Person(s) Educated Patient   Methods Demonstration;Explanation;Handout;Verbal cues   Comprehension Returned demonstration;Verbalized understanding          PT Short Term Goals - 06/28/16 1228      PT SHORT TERM GOAL #1   Title STG's = LTG's     PT SHORT TERM GOAL #2   Title --           PT Long Term Goals - 08-13-2016 1641      PT LONG TERM GOAL #1   Title Pt will perform vestibular HEP with mod I using paper handout to maximize functional gains made in PT.  (Target date: 07/26/16)   Baseline 2023/08/14: Interimttently requires min cueing for vestibular HEP.   Status Partially Met     PT LONG TERM GOAL #2   Title DHI score will improve from 86 to 68 to indicate decreased pt-perceived disability due to dizziness.  (07/26/16)   Baseline 2023-08-14: [place] = 2   Status Achieved               Plan - 08-13-16 1648    Clinical Impression Statement Pt has met all goals, reports no presence of dizziness and therefore will be discharged from outpatient PT. Educated pt on [place] during this session. Pt in full agreement with DC plan.    Consulted and Agree with Plan of Care Patient      Patient will benefit from skilled therapeutic intervention in order to improve the following deficits and impairments:     Visit Diagnosis: Unsteadiness on feet       G-Codes - Aug 13, 2016 1641    Functional Assessment Tool Used DHI = 2   Functional Limitation Self care   Self Care Goal Status ([id number]) At least 60 percent but less than 80 percent impaired, limited or restricted   Self Care Discharge Status [phone]) At least 1 percent but less than 20 percent impaired, limited or restricted      Problem  List Patient Active Problem List   Diagnosis Date Noted  . Dementia arising in the senium and presenium 06/27/2016  . Obesity, Class II, BMI 35-39.9, with comorbidity ([place]) 05/15/2016  . Insomnia w/ sleep apnea 10/16/2015  . Arthralgia 08/31/2015  . Other constipation 03/16/2015  . IBS (irritable  bowel syndrome) 02/27/2015  . Lumbar spondylosis 02/10/2015  . Lumbar radiculopathy 02/10/2015  . Osteoarthritis of right knee 02/10/2015  . Hashimoto's thyroiditis 05/05/2014  . Hyperglycemia 02/21/2014  . Adrenal incidentaloma ([place]) 11/04/2013  . Benign paroxysmal positional vertigo 07/15/2013  . Aortic stenosis, mild 07/14/2013  . OAB (overactive bladder) 02/24/2013  . GERD with stricture 12/24/2012  . Sleep-related hypoventilation 07/30/2012  . OSA (obstructive sleep apnea) 04/16/2012  . Osteopenia 04/16/2012  . Preventative health care 05/24/2011  . VITAMIN D DEFICIENCY 12/04/2010  . Hyperlipidemia with target LDL less than 130 12/03/2010  . GAD (generalized anxiety disorder) 12/03/2010  . DEPRESSION 12/03/2010  . Essential hypertension 12/03/2010    Billie Ruddy, PT, DPT [facility] [address] [place], [place], [address] Phone: [phone]   Fax:  [phone] 07/25/16, 4:50 PM  Name: [name] MRN: [id number] Date of Birth: 15-Sep-1933   PHYSICAL THERAPY DISCHARGE SUMMARY  Visits from Start of Care: 3  Current functional level related to goals / functional outcomes: See above.   Remaining deficits: See above.   Education / Equipment: See above.  Plan: Patient agrees to discharge.  Patient goals were met. Patient is being discharged due to meeting the stated rehab goals.  ?????          Billie Ruddy, PT, [place] [address] [place], [place], [address] Phone: [phone]   Fax:  [phone] 10/16/16, 10:13 AM

## 2016-08-09 ENCOUNTER — Telehealth: Payer: Self-pay | Admitting: Internal Medicine

## 2016-08-09 DIAGNOSIS — M5416 Radiculopathy, lumbar region: Secondary | ICD-10-CM

## 2016-08-09 DIAGNOSIS — M1711 Unilateral primary osteoarthritis, right knee: Secondary | ICD-10-CM

## 2016-08-09 NOTE — Telephone Encounter (Signed)
Pt request refill for oxyCODONE-acetaminophen (PERCOCET) 10-325. She will be out of the 08/15/16. Please call her back

## 2016-08-09 NOTE — Telephone Encounter (Signed)
Please advise in PCP absence.  

## 2016-08-12 MED ORDER — OXYCODONE-ACETAMINOPHEN 10-325 MG PO TABS: 1.0000 | ORAL_TABLET | Freq: Four times a day (QID) | ORAL | 0 refills | Status: DC | PRN

## 2016-08-12 NOTE — Telephone Encounter (Signed)
Pt informed rx is up front for pick up.  

## 2016-08-12 NOTE — Addendum Note (Signed)
Addended by: Binnie Rail on: 08/12/2016 07:53 AM   Modules accepted: Orders

## 2016-08-12 NOTE — Telephone Encounter (Signed)
[place] controlled substance database checked.  She is due for a refill.  Prescription printed.

## 2016-08-28 ENCOUNTER — Other Ambulatory Visit: Payer: Self-pay | Admitting: Internal Medicine

## 2016-08-28 DIAGNOSIS — F411 Generalized anxiety disorder: Secondary | ICD-10-CM

## 2016-08-28 DIAGNOSIS — F329 Major depressive disorder, single episode, unspecified: Principal | ICD-10-CM

## 2016-08-28 DIAGNOSIS — F0393 Unspecified dementia, unspecified severity, with mood disturbance: Secondary | ICD-10-CM

## 2016-08-28 DIAGNOSIS — F0280 Dementia in other diseases classified elsewhere without behavioral disturbance: Secondary | ICD-10-CM

## 2016-09-02 ENCOUNTER — Telehealth: Payer: Self-pay

## 2016-09-02 NOTE — Telephone Encounter (Signed)
Home Health Cert/Plan of Care received (07/01/2016 - 08/29/2016) and placed on MD's desk for signature

## 2016-09-02 NOTE — Telephone Encounter (Signed)
Paperwork signed, faxed, copy sent to scan 

## 2016-09-10 ENCOUNTER — Other Ambulatory Visit (INDEPENDENT_AMBULATORY_CARE_PROVIDER_SITE_OTHER): Payer: Commercial Managed Care - HMO

## 2016-09-10 ENCOUNTER — Encounter: Payer: Self-pay | Admitting: Internal Medicine

## 2016-09-10 ENCOUNTER — Ambulatory Visit (INDEPENDENT_AMBULATORY_CARE_PROVIDER_SITE_OTHER): Payer: Commercial Managed Care - HMO | Admitting: Internal Medicine

## 2016-09-10 VITALS — BP 104/90 | HR 63 | Temp 98.1°F | Resp 16 | Ht 62.0 in | Wt 201.5 lb

## 2016-09-10 DIAGNOSIS — I10 Essential (primary) hypertension: Secondary | ICD-10-CM | POA: Diagnosis not present

## 2016-09-10 DIAGNOSIS — M5416 Radiculopathy, lumbar region: Secondary | ICD-10-CM

## 2016-09-10 DIAGNOSIS — Z79899 Other long term (current) drug therapy: Secondary | ICD-10-CM | POA: Diagnosis not present

## 2016-09-10 DIAGNOSIS — I350 Nonrheumatic aortic (valve) stenosis: Secondary | ICD-10-CM

## 2016-09-10 DIAGNOSIS — Z23 Encounter for immunization: Secondary | ICD-10-CM | POA: Diagnosis not present

## 2016-09-10 DIAGNOSIS — M1711 Unilateral primary osteoarthritis, right knee: Secondary | ICD-10-CM

## 2016-09-10 DIAGNOSIS — Z79891 Long term (current) use of opiate analgesic: Secondary | ICD-10-CM | POA: Diagnosis not present

## 2016-09-10 LAB — BASIC METABOLIC PANEL
BUN: 17 mg/dL (ref 6–23)
CHLORIDE: 98 meq/L (ref 96–112)
CO2: 35 meq/L — AB (ref 19–32)
CREATININE: 0.69 mg/dL (ref 0.40–1.20)
Calcium: 10 mg/dL (ref 8.4–10.5)
GFR: 104.87 mL/min (ref >60.00)
GLUCOSE: 101 mg/dL — AB (ref 70–99)
Potassium: 3.9 mEq/L (ref 3.5–5.1)
Sodium: 139 mEq/L (ref 135–145)

## 2016-09-10 MED ORDER — OXYCODONE-ACETAMINOPHEN 10-325 MG PO TABS: 1.0000 | ORAL_TABLET | Freq: Four times a day (QID) | ORAL | 0 refills | Status: DC | PRN

## 2016-09-10 MED ORDER — ISOSORBIDE MONONITRATE 20 MG PO TABS: 20.0000 mg | ORAL_TABLET | Freq: Two times a day (BID) | ORAL | 3 refills | Status: DC

## 2016-09-10 MED ORDER — HYDRALAZINE HCL 50 MG PO TABS: 50.0000 mg | ORAL_TABLET | Freq: Three times a day (TID) | ORAL | 3 refills | Status: DC

## 2016-09-10 NOTE — Progress Notes (Signed)
Subjective:  Patient ID: [name], female    DOB: Jun 05, 1933  Age: 82 y.o. MRN: [id number]  CC: Hypertension   HPI [name] presents for a blood pressure check. She also complains of persistent pain in her lower back and both knees and requests a refill on pain meds. She tells me her blood pressure has been well controlled. She complains that BiDil is too expensive and she wants it broken up into some generics. She denies any recent episodes of headache, blurred vision, chest pain, shortness of breath, palpitations, edema, or fatigue.  She has been overusing her Klonopin. The prescription was written for twice a day and she has been using it up to 3 times a day. I have asked her to taper off of Klonopin.  Outpatient Medications Prior to Visit  Medication Sig Dispense Refill  . aspirin EC 325 MG tablet Take 325 mg by mouth daily.    . calcium carbonate (OS-CAL) 600 MG TABS tablet Take 600 mg by mouth 2 (two) times daily with a meal.    . carvedilol (COREG) 6.25 MG tablet Take 1 tablet (6.25 mg total) by mouth 2 (two) times daily with a meal. 60 tablet 11  . chlorthalidone (HYGROTON) 25 MG tablet Take 1 tablet (25 mg total) by mouth daily. 30 tablet 11  . cholecalciferol (VITAMIN D) 1000 units tablet Take 2,000 Units by mouth daily.    Marland Kitchen omeprazole (PRILOSEC) 40 MG capsule Take 1 capsule (40 mg total) by mouth daily. 30 capsule 11  . traZODone (DESYREL) 100 MG tablet take 2 tablets by mouth at bedtime 180 tablet 3  . oxyCODONE-acetaminophen (PERCOCET) 10-325 MG tablet Take 1 tablet by mouth every 6 (six) hours as needed for pain. 120 tablet 0  . isosorbide-hydrALAZINE (BIDIL) 20-37.5 MG tablet Take 0.5 tablets by mouth 2 (two) times daily. (Patient not taking: Reported on 09/10/2016) 30 tablet 11  . meclizine (ANTIVERT) 25 MG tablet Take 25 mg by mouth 3 (three) times daily as needed for dizziness.    . mirabegron ER (MYRBETRIQ) 25 MG TB24 tablet Take 1 tablet (25 mg total) by mouth  daily. (Patient not taking: Reported on 09/10/2016) 30 tablet 11  . traZODone (DESYREL) 100 MG tablet Take 200 mg by mouth at bedtime as needed for sleep.     No facility-administered medications prior to visit.     ROS Review of Systems  Constitutional: Negative for activity change, appetite change, diaphoresis, fatigue and unexpected weight change.  HENT: Negative.   Eyes: Negative.  Negative for visual disturbance.  Respiratory: Negative.  Negative for cough, choking, chest tightness, shortness of breath and stridor.   Cardiovascular: Negative for chest pain, palpitations and leg swelling.  Gastrointestinal: Negative.  Negative for abdominal pain, blood in stool, constipation, diarrhea, nausea and vomiting.  Endocrine: Negative.   Genitourinary: Negative.   Musculoskeletal: Positive for arthralgias and back pain. Negative for joint swelling, myalgias and neck pain.  Skin: Negative.  Negative for color change, pallor and rash.  Allergic/Immunologic: Negative.   Neurological: Negative.  Negative for dizziness, weakness, light-headedness, numbness and headaches.  Hematological: Negative for adenopathy. Does not bruise/bleed easily.  Psychiatric/Behavioral: Negative for behavioral problems, confusion, decreased concentration, dysphoric mood, hallucinations, sleep disturbance and suicidal ideas. The patient is nervous/anxious.     Objective:  BP 104/90 (BP Location: Left Arm, Patient Position: Sitting, Cuff Size: Large)   Pulse 63   Temp 98.1 F (36.7 C) (Oral)   Ht 5\' 2"  (1.575 m)  Wt 201 lb 8 oz (91.4 kg)   SpO2 95%   BMI 36.85 kg/m   BP Readings from Last 3 Encounters:  09/10/16 104/90  06/28/16 140/90  06/27/16 (!) 162/118    Wt Readings from Last 3 Encounters:  09/10/16 201 lb 8 oz (91.4 kg)  06/27/16 206 lb (93.4 kg)  06/11/16 208 lb (94.3 kg)    Physical Exam  Constitutional: She is oriented to person, place, and time. No distress.  HENT:  Mouth/Throat:  Oropharynx is clear and moist. No oropharyngeal exudate.  Eyes: Conjunctivae are normal. Right eye exhibits no discharge. Left eye exhibits no discharge. No scleral icterus.  Neck: Normal range of motion. Neck supple. No JVD present. No tracheal deviation present. No thyromegaly present.  Cardiovascular: Normal rate, regular rhythm, normal heart sounds and intact distal pulses.  Exam reveals no gallop and no friction rub.   No murmur heard. Pulmonary/Chest: Effort normal and breath sounds normal. No stridor. No respiratory distress. She has no wheezes. She has no rales. She exhibits no tenderness.  Abdominal: Soft. Bowel sounds are normal. She exhibits no distension and no mass. There is no tenderness. There is no rebound and no guarding.  Musculoskeletal: Normal range of motion. She exhibits no edema, tenderness or deformity.  Lymphadenopathy:    She has no cervical adenopathy.  Neurological: She is oriented to person, place, and time.  Skin: Skin is warm and dry. No rash noted. She is not diaphoretic. No erythema. No pallor.  Psychiatric: She has a normal mood and affect. Her behavior is normal. Judgment and thought content normal.  Vitals reviewed.   Lab Results  Component Value Date   WBC 7.5 06/26/2016   HGB 13.5 06/26/2016   HCT 42.0 06/26/2016   PLT 142 (L) 06/26/2016   GLUCOSE 103 (H) 06/26/2016   CHOL 222 (H) 06/11/2016   TRIG 279.0 (H) 06/11/2016   HDL 54.20 06/11/2016   LDLDIRECT 114.0 06/11/2016   LDLCALC 106 (H) 05/16/2015   ALT 9 08/31/2015   AST 12 08/31/2015   NA 140 06/26/2016   K 3.4 (L) 06/26/2016   CL 104 06/26/2016   CREATININE 0.59 06/26/2016   BUN 10 06/26/2016   CO2 30 06/26/2016   TSH 3.80 06/11/2016   INR 1.01 03/10/2015   HGBA1C 5.0 05/16/2015    Mr Brain Wo Contrast  Result Date: 06/27/2016 CLINICAL DATA:  Acute onset, gradually worsening intermittent dizziness beginning this afternoon. Hypertensive, LEFT facial droop. History of stroke,  hypertension, hyperlipidemia, diabetes. EXAM: MRI HEAD WITHOUT CONTRAST TECHNIQUE: Multiplanar, multiecho pulse sequences of the brain and surrounding structures were obtained without intravenous contrast. COMPARISON:  CT HEAD October 26, 2013 FINDINGS: INTRACRANIAL CONTENTS: No reduced diffusion to suggest acute ischemia. No susceptibility artifact to suggest hemorrhage. The ventricles and sulci are normal for patient's age. Patchy supratentorial white matter FLAIR T2 hyperintensities compatible with mild to moderate chronic small vessel ischemic disease, less than expected for age. No suspicious parenchymal signal, masses or mass effect. Old small bilateral cerebellar infarcts. No abnormal extra-axial fluid collections. No extra-axial masses though, contrast enhanced sequences would be more sensitive. Normal major intracranial vascular flow voids present at skull base. ORBITS: The included ocular globes and orbital contents are non-suspicious. Status post bilateral ocular lens implants. SINUSES: Rack maxillary mucosal retention cyst. Mastoid air cells are well aerated. SKULL/SOFT TISSUES: Empty sella. No suspicious calvarial bone marrow signal. Craniocervical junction maintained. Patient is edentulous. IMPRESSION: No acute intracranial process. Involutional changes. Mild to moderate chronic small  vessel ischemic disease. Old bilateral small cerebellar infarcts. Empty sella. Electronically Signed   By: Elon Alas M.D.   On: 06/27/2016 04:27    Assessment & Plan:   Taiya was seen today for hypertension.  Diagnoses and all orders for this visit:  Essential hypertension- her blood pressure is well-controlled, I will monitor her electrolytes and renal function, will change BIDIL to generic hydralazine and isosorbide mononitrate at her request. -     hydrALAZINE (APRESOLINE) 50 MG tablet; Take 1 tablet (50 mg total) by mouth 3 (three) times daily. -     isosorbide mononitrate (ISMO,MONOKET) 20 MG tablet;  Take 1 tablet (20 mg total) by mouth 2 (two) times daily at 10 AM and 5 PM. -     Basic metabolic panel; Future  Aortic stenosis, mild- she has no symptoms related to this, we'll continue to monitor. -     hydrALAZINE (APRESOLINE) 50 MG tablet; Take 1 tablet (50 mg total) by mouth 3 (three) times daily. -     isosorbide mononitrate (ISMO,MONOKET) 20 MG tablet; Take 1 tablet (20 mg total) by mouth 2 (two) times daily at 10 AM and 5 PM.  Lumbar radiculopathy -     oxyCODONE-acetaminophen (PERCOCET) 10-325 MG tablet; Take 1 tablet by mouth every 6 (six) hours as needed for pain.  Primary osteoarthritis of right knee -     oxyCODONE-acetaminophen (PERCOCET) 10-325 MG tablet; Take 1 tablet by mouth every 6 (six) hours as needed for pain.   I have discontinued Ms. Garno's isosorbide-hydrALAZINE, meclizine, mirabegron ER, promethazine, and clonazePAM. I am also having her start on hydrALAZINE and isosorbide mononitrate. Additionally, I am having her maintain her calcium carbonate, omeprazole, carvedilol, cholecalciferol, aspirin EC, chlorthalidone, traZODone, and oxyCODONE-acetaminophen.  Meds ordered this encounter  Medications  . DISCONTD: promethazine (PHENERGAN) 12.5 MG tablet  . DISCONTD: clonazePAM (KLONOPIN) 1 MG tablet    Refill:  0  . hydrALAZINE (APRESOLINE) 50 MG tablet    Sig: Take 1 tablet (50 mg total) by mouth 3 (three) times daily.    Dispense:  270 tablet    Refill:  3  . isosorbide mononitrate (ISMO,MONOKET) 20 MG tablet    Sig: Take 1 tablet (20 mg total) by mouth 2 (two) times daily at 10 AM and 5 PM.    Dispense:  180 tablet    Refill:  3  . oxyCODONE-acetaminophen (PERCOCET) 10-325 MG tablet    Sig: Take 1 tablet by mouth every 6 (six) hours as needed for pain.    Dispense:  120 tablet    Refill:  0    Fill on or after 09/10/16     Follow-up: Return in about 3 months (around 12/11/2016).  Scarlette Calico, MD

## 2016-09-10 NOTE — Progress Notes (Signed)
Pre visit review using our clinic review tool, if applicable. No additional management support is needed unless otherwise documented below in the visit note. 

## 2016-09-10 NOTE — Patient Instructions (Signed)
Hypertension Hypertension, commonly called high blood pressure, is when the force of blood pumping through your arteries is too strong. Your arteries are the blood vessels that carry blood from your heart throughout your body. A blood pressure reading consists of a higher number over a lower number, such as 110/72. The higher number (systolic) is the pressure inside your arteries when your heart pumps. The lower number (diastolic) is the pressure inside your arteries when your heart relaxes. Ideally you want your blood pressure below 120/80. Hypertension forces your heart to work harder to pump blood. Your arteries may become narrow or stiff. Having untreated or uncontrolled hypertension can cause heart attack, stroke, kidney disease, and other problems. RISK FACTORS Some risk factors for high blood pressure are controllable. Others are not.  Risk factors you cannot control include:   Race. You may be at higher risk if you are African American.  Age. Risk increases with age.  Gender. Men are at higher risk than women before age 45 years. After age 65, women are at higher risk than men. Risk factors you can control include:  Not getting enough exercise or physical activity.  Being overweight.  Getting too much fat, sugar, calories, or salt in your diet.  Drinking too much alcohol. SIGNS AND SYMPTOMS Hypertension does not usually cause signs or symptoms. Extremely high blood pressure (hypertensive crisis) may cause headache, anxiety, shortness of breath, and nosebleed. DIAGNOSIS To check if you have hypertension, your health care provider will measure your blood pressure while you are seated, with your arm held at the level of your heart. It should be measured at least twice using the same arm. Certain conditions can cause a difference in blood pressure between your right and left arms. A blood pressure reading that is higher than normal on one occasion does not mean that you need treatment. If  it is not clear whether you have high blood pressure, you may be asked to return on a different day to have your blood pressure checked again. Or, you may be asked to monitor your blood pressure at home for 1 or more weeks. TREATMENT Treating high blood pressure includes making lifestyle changes and possibly taking medicine. Living a healthy lifestyle can help lower high blood pressure. You may need to change some of your habits. Lifestyle changes may include:  Following the DASH diet. This diet is high in fruits, vegetables, and whole grains. It is low in salt, red meat, and added sugars.  Keep your sodium intake below 2,300 mg per day.  Getting at least 30-45 minutes of aerobic exercise at least 4 times per week.  Losing weight if necessary.  Not smoking.  Limiting alcoholic beverages.  Learning ways to reduce stress. Your health care provider may prescribe medicine if lifestyle changes are not enough to get your blood pressure under control, and if one of the following is true:  You are 18-59 years of age and your systolic blood pressure is above 140.  You are 60 years of age or older, and your systolic blood pressure is above 150.  Your diastolic blood pressure is above 90.  You have diabetes, and your systolic blood pressure is over 140 or your diastolic blood pressure is over 90.  You have kidney disease and your blood pressure is above 140/90.  You have heart disease and your blood pressure is above 140/90. Your personal target blood pressure may vary depending on your medical conditions, your age, and other factors. HOME CARE INSTRUCTIONS    Have your blood pressure rechecked as directed by your health care provider.   Take medicines only as directed by your health care provider. Follow the directions carefully. Blood pressure medicines must be taken as prescribed. The medicine does not work as well when you skip doses. Skipping doses also puts you at risk for  problems.  Do not smoke.   Monitor your blood pressure at home as directed by your health care provider. SEEK MEDICAL CARE IF:   You think you are having a reaction to medicines taken.  You have recurrent headaches or feel dizzy.  You have swelling in your ankles.  You have trouble with your vision. SEEK IMMEDIATE MEDICAL CARE IF:  You develop a severe headache or confusion.  You have unusual weakness, numbness, or feel faint.  You have severe chest or abdominal pain.  You vomit repeatedly.  You have trouble breathing. MAKE SURE YOU:   Understand these instructions.  Will watch your condition.  Will get help right away if you are not doing well or get worse.   This information is not intended to replace advice given to you by your health care provider. Make sure you discuss any questions you have with your health care provider.   Document Released: 11/11/2005 Document Revised: 03/28/2015 Document Reviewed: 09/03/2013 Elsevier Interactive Patient Education 2016 [employer]  

## 2016-09-17 ENCOUNTER — Ambulatory Visit: Payer: Commercial Managed Care - HMO | Admitting: Internal Medicine

## 2016-09-17 DIAGNOSIS — M4724 Other spondylosis with radiculopathy, thoracic region: Secondary | ICD-10-CM | POA: Diagnosis not present

## 2016-09-17 DIAGNOSIS — K589 Irritable bowel syndrome without diarrhea: Secondary | ICD-10-CM | POA: Diagnosis not present

## 2016-09-17 DIAGNOSIS — I10 Essential (primary) hypertension: Secondary | ICD-10-CM | POA: Diagnosis not present

## 2016-09-17 DIAGNOSIS — F0390 Unspecified dementia without behavioral disturbance: Secondary | ICD-10-CM | POA: Diagnosis not present

## 2016-09-24 ENCOUNTER — Encounter: Payer: Self-pay | Admitting: Internal Medicine

## 2016-09-24 DIAGNOSIS — H3561 Retinal hemorrhage, right eye: Secondary | ICD-10-CM | POA: Diagnosis not present

## 2016-09-24 DIAGNOSIS — H35363 Drusen (degenerative) of macula, bilateral: Secondary | ICD-10-CM | POA: Diagnosis not present

## 2016-09-24 DIAGNOSIS — E113291 Type 2 diabetes mellitus with mild nonproliferative diabetic retinopathy without macular edema, right eye: Secondary | ICD-10-CM | POA: Diagnosis not present

## 2016-09-24 DIAGNOSIS — H35033 Hypertensive retinopathy, bilateral: Secondary | ICD-10-CM | POA: Diagnosis not present

## 2016-10-08 ENCOUNTER — Other Ambulatory Visit: Payer: Self-pay | Admitting: Internal Medicine

## 2016-10-08 ENCOUNTER — Telehealth: Payer: Self-pay | Admitting: *Deleted

## 2016-10-08 DIAGNOSIS — M1711 Unilateral primary osteoarthritis, right knee: Secondary | ICD-10-CM

## 2016-10-08 DIAGNOSIS — M5416 Radiculopathy, lumbar region: Secondary | ICD-10-CM

## 2016-10-08 MED ORDER — OXYCODONE-ACETAMINOPHEN 10-325 MG PO TABS: 1.0000 | ORAL_TABLET | Freq: Four times a day (QID) | ORAL | 0 refills | Status: DC | PRN

## 2016-10-08 NOTE — Telephone Encounter (Signed)
Notified pt rx ready for pick-up.../lmb 

## 2016-10-08 NOTE — Telephone Encounter (Signed)
done

## 2016-10-08 NOTE — Telephone Encounter (Signed)
Rec'd call requesting refill on Oxycodone...[name]

## 2016-11-05 ENCOUNTER — Telehealth: Payer: Self-pay | Admitting: *Deleted

## 2016-11-05 NOTE — Telephone Encounter (Signed)
Rec'd call pt requesting refill on her Oxycodone.../lmb 

## 2016-11-06 ENCOUNTER — Other Ambulatory Visit: Payer: Self-pay | Admitting: Internal Medicine

## 2016-11-06 DIAGNOSIS — M5416 Radiculopathy, lumbar region: Secondary | ICD-10-CM

## 2016-11-06 DIAGNOSIS — M1711 Unilateral primary osteoarthritis, right knee: Secondary | ICD-10-CM

## 2016-11-06 MED ORDER — OXYCODONE-ACETAMINOPHEN 10-325 MG PO TABS: 1.0000 | ORAL_TABLET | Freq: Four times a day (QID) | ORAL | 0 refills | Status: DC | PRN

## 2016-11-06 NOTE — Telephone Encounter (Signed)
Notified pt rx ready for pick-up.../lmb 

## 2016-11-06 NOTE — Telephone Encounter (Signed)
done

## 2016-11-07 ENCOUNTER — Other Ambulatory Visit: Payer: Self-pay | Admitting: Internal Medicine

## 2016-11-08 ENCOUNTER — Telehealth: Payer: Self-pay | Admitting: Internal Medicine

## 2016-11-08 NOTE — Telephone Encounter (Signed)
Contact pt and informed that per OV notes on 09/10/16 pt was to begin tapering off due to overuse. Pt states that she was misunderstood and had not been taking it TID. Informed pt I would pass this note along to PCP for consideration.

## 2016-11-08 NOTE — Telephone Encounter (Signed)
Patient called to follow up on clonazapam. Advised that it was denied, and d/c on 09/10/2016. Patient states that she was not told. Please explain to patient

## 2016-11-08 NOTE — Telephone Encounter (Signed)
How often does she take it?

## 2016-11-11 ENCOUNTER — Other Ambulatory Visit: Payer: Self-pay | Admitting: Internal Medicine

## 2016-11-11 ENCOUNTER — Telehealth: Payer: Self-pay | Admitting: Emergency Medicine

## 2016-11-11 DIAGNOSIS — F411 Generalized anxiety disorder: Secondary | ICD-10-CM

## 2016-11-11 MED ORDER — CLONAZEPAM 1 MG PO TABS: 1.0000 mg | ORAL_TABLET | Freq: Two times a day (BID) | ORAL | 3 refills | Status: DC

## 2016-11-11 NOTE — Telephone Encounter (Signed)
Patient called and wants to know if you can give her a call about her klonopin.  She stated the doctor discontinued this medication but that she really needs it. Please advise thanks.

## 2016-11-11 NOTE — Telephone Encounter (Signed)
Closing this note. Previous note regarding same.

## 2016-11-11 NOTE — Telephone Encounter (Signed)
Pt informed and rx faxed to [facility].

## 2016-11-11 NOTE — Telephone Encounter (Signed)
RX written 

## 2016-11-11 NOTE — Telephone Encounter (Signed)
Pt states that she was taking it twice per day.

## 2016-12-11 ENCOUNTER — Ambulatory Visit: Payer: Commercial Managed Care - HMO | Admitting: Internal Medicine

## 2016-12-11 ENCOUNTER — Other Ambulatory Visit: Payer: Self-pay | Admitting: Internal Medicine

## 2016-12-11 ENCOUNTER — Telehealth: Payer: Self-pay | Admitting: Internal Medicine

## 2016-12-11 DIAGNOSIS — F0280 Dementia in other diseases classified elsewhere without behavioral disturbance: Secondary | ICD-10-CM

## 2016-12-11 DIAGNOSIS — K219 Gastro-esophageal reflux disease without esophagitis: Secondary | ICD-10-CM

## 2016-12-11 DIAGNOSIS — K222 Esophageal obstruction: Secondary | ICD-10-CM

## 2016-12-11 DIAGNOSIS — I10 Essential (primary) hypertension: Secondary | ICD-10-CM

## 2016-12-11 DIAGNOSIS — F0393 Unspecified dementia, unspecified severity, with mood disturbance: Secondary | ICD-10-CM

## 2016-12-11 DIAGNOSIS — F329 Major depressive disorder, single episode, unspecified: Principal | ICD-10-CM

## 2016-12-11 DIAGNOSIS — M1711 Unilateral primary osteoarthritis, right knee: Secondary | ICD-10-CM

## 2016-12-11 DIAGNOSIS — M5416 Radiculopathy, lumbar region: Secondary | ICD-10-CM

## 2016-12-11 DIAGNOSIS — I350 Nonrheumatic aortic (valve) stenosis: Secondary | ICD-10-CM

## 2016-12-11 DIAGNOSIS — F411 Generalized anxiety disorder: Secondary | ICD-10-CM

## 2016-12-11 MED ORDER — TRAZODONE HCL 100 MG PO TABS: 200.0000 mg | ORAL_TABLET | Freq: Every day | ORAL | 3 refills | Status: DC

## 2016-12-11 MED ORDER — ISOSORBIDE MONONITRATE 20 MG PO TABS: 20.0000 mg | ORAL_TABLET | Freq: Two times a day (BID) | ORAL | 3 refills | Status: DC

## 2016-12-11 MED ORDER — OMEPRAZOLE 40 MG PO CPDR: 40.0000 mg | DELAYED_RELEASE_CAPSULE | Freq: Every day | ORAL | 3 refills | Status: DC

## 2016-12-11 MED ORDER — OXYCODONE-ACETAMINOPHEN 10-325 MG PO TABS: 1.0000 | ORAL_TABLET | Freq: Four times a day (QID) | ORAL | 0 refills | Status: DC | PRN

## 2016-12-11 NOTE — Telephone Encounter (Signed)
Had to reschedule appt b/c of weather - office closed.  Patient would like to know if she could get refills on percocet, trazodone, prilosec, and isosorbide.

## 2016-12-13 NOTE — Telephone Encounter (Signed)
Pt informed rx is up front to pick up.

## 2016-12-17 ENCOUNTER — Ambulatory Visit (INDEPENDENT_AMBULATORY_CARE_PROVIDER_SITE_OTHER): Payer: Medicare HMO | Admitting: Internal Medicine

## 2016-12-17 VITALS — BP 128/88 | HR 70 | Temp 98.2°F | Ht 62.0 in | Wt 201.1 lb

## 2016-12-17 DIAGNOSIS — I10 Essential (primary) hypertension: Secondary | ICD-10-CM | POA: Diagnosis not present

## 2016-12-17 DIAGNOSIS — M47816 Spondylosis without myelopathy or radiculopathy, lumbar region: Secondary | ICD-10-CM | POA: Diagnosis not present

## 2016-12-17 DIAGNOSIS — M5416 Radiculopathy, lumbar region: Secondary | ICD-10-CM

## 2016-12-17 DIAGNOSIS — M1711 Unilateral primary osteoarthritis, right knee: Secondary | ICD-10-CM

## 2016-12-17 MED ORDER — OXYCODONE-ACETAMINOPHEN 10-325 MG PO TABS: 1.0000 | ORAL_TABLET | Freq: Three times a day (TID) | ORAL | 0 refills | Status: DC | PRN

## 2016-12-17 NOTE — Progress Notes (Signed)
Subjective:  Patient ID: [name], female    DOB: 09-13-1933  Age: 83 y.o. MRN: [id number]  CC: Hypertension; Back Pain; and Osteoarthritis   HPI [name] presents for Follow-up. She has concerns about the complications from chronic opioid therapy and wants to discuss decreasing her Percocet dose. She currently takes it 4 times a day to control chronic pain but she is willing to take it 3 times a day. She is also concerned about her benzo medication but she feels like she has too much anxiety at this time to lower the benzo dose but she agrees to consider that over the next few months. She offers no new or different symptoms today.  Outpatient Medications Prior to Visit  Medication Sig Dispense Refill  . aspirin EC 325 MG tablet Take 325 mg by mouth daily.    . calcium carbonate (OS-CAL) 600 MG TABS tablet Take 600 mg by mouth 2 (two) times daily with a meal.    . carvedilol (COREG) 6.25 MG tablet Take 1 tablet (6.25 mg total) by mouth 2 (two) times daily with a meal. 60 tablet 11  . chlorthalidone (HYGROTON) 25 MG tablet Take 1 tablet (25 mg total) by mouth daily. 30 tablet 11  . cholecalciferol (VITAMIN D) 1000 units tablet Take 2,000 Units by mouth daily.    . clonazePAM (KLONOPIN) 1 MG tablet Take 1 tablet (1 mg total) by mouth 2 (two) times daily. 60 tablet 3  . hydrALAZINE (APRESOLINE) 50 MG tablet Take 1 tablet (50 mg total) by mouth 3 (three) times daily. 270 tablet 3  . isosorbide mononitrate (ISMO,MONOKET) 20 MG tablet Take 1 tablet (20 mg total) by mouth 2 (two) times daily at 10 AM and 5 PM. 180 tablet 3  . omeprazole (PRILOSEC) 40 MG capsule Take 1 capsule (40 mg total) by mouth daily. 90 capsule 3  . traZODone (DESYREL) 100 MG tablet Take 2 tablets (200 mg total) by mouth at bedtime. 180 tablet 3  . oxyCODONE-acetaminophen (PERCOCET) 10-325 MG tablet Take 1 tablet by mouth every 6 (six) hours as needed for pain. 120 tablet 0   No facility-administered medications prior  to visit.     ROS Review of Systems  Constitutional: Negative for appetite change, diaphoresis, fatigue and unexpected weight change.  HENT: Negative.   Eyes: Negative for visual disturbance.  Respiratory: Negative for cough, chest tightness, shortness of breath and wheezing.   Cardiovascular: Negative for chest pain, palpitations and leg swelling.  Gastrointestinal: Positive for constipation. Negative for abdominal pain, diarrhea, nausea and vomiting.  Endocrine: Negative.  Negative for cold intolerance and heat intolerance.  Genitourinary: Negative.  Negative for difficulty urinating.  Musculoskeletal: Positive for arthralgias and back pain. Negative for myalgias and neck pain.  Skin: Negative.   Allergic/Immunologic: Negative.   Neurological: Negative.   Hematological: Negative.  Negative for adenopathy. Does not bruise/bleed easily.  Psychiatric/Behavioral: Negative.     Objective:  BP 128/88 (BP Location: Left Arm, Patient Position: Sitting, Cuff Size: Normal)   Pulse 70   Temp 98.2 F (36.8 C)   Ht 5\' 2"  (1.575 m)   Wt 201 lb 1.3 oz (91.2 kg)   SpO2 94%   BMI 36.78 kg/m   BP Readings from Last 3 Encounters:  12/17/16 128/88  09/10/16 104/90  06/28/16 140/90    Wt Readings from Last 3 Encounters:  12/17/16 201 lb 1.3 oz (91.2 kg)  09/10/16 201 lb 8 oz (91.4 kg)  06/27/16 206 lb (93.4  kg)    Physical Exam  Constitutional: She is oriented to person, place, and time. No distress.  HENT:  Mouth/Throat: Oropharynx is clear and moist. No oropharyngeal exudate.  Eyes: Conjunctivae are normal. Right eye exhibits no discharge. Left eye exhibits no discharge. No scleral icterus.  Neck: Normal range of motion. Neck supple. No JVD present. No tracheal deviation present. No thyromegaly present.  Cardiovascular: Normal rate, regular rhythm, normal heart sounds and intact distal pulses.  Exam reveals no gallop and no friction rub.   No murmur heard. Pulmonary/Chest: Effort  normal and breath sounds normal. No stridor. No respiratory distress. She has no wheezes. She has no rales. She exhibits no tenderness.  Abdominal: Soft. Bowel sounds are normal. She exhibits no distension and no mass. There is no tenderness. There is no rebound and no guarding.  Musculoskeletal: Normal range of motion. She exhibits no edema, tenderness or deformity.  Lymphadenopathy:    She has no cervical adenopathy.  Neurological: She is oriented to person, place, and time.  Skin: Skin is warm and dry. No rash noted. She is not diaphoretic. No erythema. No pallor.  Vitals reviewed.   Lab Results  Component Value Date   WBC 7.5 06/26/2016   HGB 13.5 06/26/2016   HCT 42.0 06/26/2016   PLT 142 (L) 06/26/2016   GLUCOSE 101 (H) 09/10/2016   CHOL 222 (H) 06/11/2016   TRIG 279.0 (H) 06/11/2016   HDL 54.20 06/11/2016   LDLDIRECT 114.0 06/11/2016   LDLCALC 106 (H) 05/16/2015   ALT 9 08/31/2015   AST 12 08/31/2015   NA 139 09/10/2016   K 3.9 09/10/2016   CL 98 09/10/2016   CREATININE 0.69 09/10/2016   BUN 17 09/10/2016   CO2 35 (H) 09/10/2016   TSH 3.80 06/11/2016   INR 1.01 03/10/2015   HGBA1C 5.0 05/16/2015    Mr Brain Wo Contrast  Result Date: 06/27/2016 CLINICAL DATA:  Acute onset, gradually worsening intermittent dizziness beginning this afternoon. Hypertensive, LEFT facial droop. History of stroke, hypertension, hyperlipidemia, diabetes. EXAM: MRI HEAD WITHOUT CONTRAST TECHNIQUE: Multiplanar, multiecho pulse sequences of the brain and surrounding structures were obtained without intravenous contrast. COMPARISON:  CT HEAD October 26, 2013 FINDINGS: INTRACRANIAL CONTENTS: No reduced diffusion to suggest acute ischemia. No susceptibility artifact to suggest hemorrhage. The ventricles and sulci are normal for patient's age. Patchy supratentorial white matter FLAIR T2 hyperintensities compatible with mild to moderate chronic small vessel ischemic disease, less than expected for age. No  suspicious parenchymal signal, masses or mass effect. Old small bilateral cerebellar infarcts. No abnormal extra-axial fluid collections. No extra-axial masses though, contrast enhanced sequences would be more sensitive. Normal major intracranial vascular flow voids present at skull base. ORBITS: The included ocular globes and orbital contents are non-suspicious. Status post bilateral ocular lens implants. SINUSES: Rack maxillary mucosal retention cyst. Mastoid air cells are well aerated. SKULL/SOFT TISSUES: Empty sella. No suspicious calvarial bone marrow signal. Craniocervical junction maintained. Patient is edentulous. IMPRESSION: No acute intracranial process. Involutional changes. Mild to moderate chronic small vessel ischemic disease. Old bilateral small cerebellar infarcts. Empty sella. Electronically Signed   By: Elon Alas M.D.   On: 06/27/2016 04:27    Assessment & Plan:   Shealee was seen today for hypertension, back pain and osteoarthritis.  Diagnoses and all orders for this visit:  Essential hypertension- her blood pressure is adequately well controlled.  Lumbar radiculopathy -     Discontinue: oxyCODONE-acetaminophen (PERCOCET) 10-325 MG tablet; Take 1 tablet by mouth every  8 (eight) hours as needed for pain. -     oxyCODONE-acetaminophen (PERCOCET) 10-325 MG tablet; Take 1 tablet by mouth every 8 (eight) hours as needed for pain.  Lumbar spondylosis -     Discontinue: oxyCODONE-acetaminophen (PERCOCET) 10-325 MG tablet; Take 1 tablet by mouth every 8 (eight) hours as needed for pain. -     oxyCODONE-acetaminophen (PERCOCET) 10-325 MG tablet; Take 1 tablet by mouth every 8 (eight) hours as needed for pain.  Primary osteoarthritis of right knee- will taper her Percocet dose from 4 times a day to 3 times a day for the next 2 months. She will return in about 2 months and I will reassess her pain and consider further tapering of the opioid therapy at that time. -     Discontinue:  oxyCODONE-acetaminophen (PERCOCET) 10-325 MG tablet; Take 1 tablet by mouth every 8 (eight) hours as needed for pain. -     oxyCODONE-acetaminophen (PERCOCET) 10-325 MG tablet; Take 1 tablet by mouth every 8 (eight) hours as needed for pain.   I am having Ms. Zywicki maintain her calcium carbonate, carvedilol, cholecalciferol, aspirin EC, chlorthalidone, hydrALAZINE, clonazePAM, traZODone, isosorbide mononitrate, omeprazole, meclizine, and oxyCODONE-acetaminophen.  Meds ordered this encounter  Medications  . meclizine (ANTIVERT) 25 MG tablet  . DISCONTD: oxyCODONE-acetaminophen (PERCOCET) 10-325 MG tablet    Sig: Take 1 tablet by mouth every 8 (eight) hours as needed for pain.    Dispense:  90 tablet    Refill:  0    Fill on or after 12/26/16  . oxyCODONE-acetaminophen (PERCOCET) 10-325 MG tablet    Sig: Take 1 tablet by mouth every 8 (eight) hours as needed for pain.    Dispense:  90 tablet    Refill:  0    Fill on or after 01/23/17     Follow-up: Return in about 2 months (around 02/14/2017).  Scarlette Calico, MD

## 2016-12-17 NOTE — Patient Instructions (Signed)

## 2016-12-17 NOTE — Progress Notes (Signed)
Pre visit review using our clinic review tool, if applicable. No additional management support is needed unless otherwise documented below in the visit note. 

## 2016-12-20 ENCOUNTER — Encounter: Payer: Self-pay | Admitting: Internal Medicine

## 2016-12-30 ENCOUNTER — Other Ambulatory Visit: Payer: Self-pay | Admitting: Internal Medicine

## 2017-01-28 ENCOUNTER — Telehealth: Payer: Self-pay | Admitting: *Deleted

## 2017-01-28 NOTE — Telephone Encounter (Signed)
called daughter back she states mom has taken all of her oxycodone the month. She was still taking 3 pills a day instead of taking 1 every day as MD stated. She states she has also taken all of her clonazepam too, and she is a nervous wreck. Was told about a medicine called " Saboxine " that help w/ withdrawal symptoms from opioids. Wanting to know is this something that mom can possible get...[name]

## 2017-01-28 NOTE — Telephone Encounter (Signed)
If she feels that bad then she should not wean so quickly

## 2017-01-28 NOTE — Telephone Encounter (Signed)
Rec'd call from pt daughter stating they are concern about mom. MD has been weaning off her oxycodone, and they think she is going through withdrawals been having diarrhea, she states she feels sick on her stomach, don't want to eat, or drink. Staying in bed not wanting to interact with people. Wanting to know is there something she can take to help w/the weaning process....[name]

## 2017-01-28 NOTE — Telephone Encounter (Signed)
I don't prescribe suboxone An MD has to have a special certificate do that There are MD's in [place] that do this though

## 2017-01-30 ENCOUNTER — Encounter (HOSPITAL_COMMUNITY): Payer: Self-pay

## 2017-01-30 ENCOUNTER — Emergency Department (HOSPITAL_COMMUNITY)
Admission: EM | Admit: 2017-01-30 | Discharge: 2017-01-30 | Disposition: A | Discharge status: Home / Self Care | Payer: Medicare HMO | Attending: Emergency Medicine | Admitting: Emergency Medicine

## 2017-01-30 DIAGNOSIS — I5032 Chronic diastolic (congestive) heart failure: Secondary | ICD-10-CM | POA: Diagnosis not present

## 2017-01-30 DIAGNOSIS — E119 Type 2 diabetes mellitus without complications: Secondary | ICD-10-CM | POA: Diagnosis not present

## 2017-01-30 DIAGNOSIS — I110 Hypertensive heart disease with heart failure: Secondary | ICD-10-CM | POA: Diagnosis not present

## 2017-01-30 DIAGNOSIS — F13930 Sedative, hypnotic or anxiolytic use, unspecified with withdrawal, uncomplicated: Secondary | ICD-10-CM

## 2017-01-30 DIAGNOSIS — Z7982 Long term (current) use of aspirin: Secondary | ICD-10-CM | POA: Insufficient documentation

## 2017-01-30 DIAGNOSIS — Z79899 Other long term (current) drug therapy: Secondary | ICD-10-CM | POA: Diagnosis not present

## 2017-01-30 DIAGNOSIS — F1310 Sedative, hypnotic or anxiolytic abuse, uncomplicated: Secondary | ICD-10-CM | POA: Diagnosis not present

## 2017-01-30 DIAGNOSIS — F13230 Sedative, hypnotic or anxiolytic dependence with withdrawal, uncomplicated: Secondary | ICD-10-CM

## 2017-01-30 DIAGNOSIS — Z96653 Presence of artificial knee joint, bilateral: Secondary | ICD-10-CM | POA: Insufficient documentation

## 2017-01-30 DIAGNOSIS — F419 Anxiety disorder, unspecified: Secondary | ICD-10-CM | POA: Diagnosis present

## 2017-01-30 DIAGNOSIS — F13239 Sedative, hypnotic or anxiolytic dependence with withdrawal, unspecified: Secondary | ICD-10-CM | POA: Diagnosis not present

## 2017-01-30 HISTORY — DX: Dorsalgia, unspecified: M54.9

## 2017-01-30 MED ORDER — LORAZEPAM 1 MG PO TABS
1.0000 mg | ORAL_TABLET | Freq: Once | ORAL | Status: AC
  Administered 2017-01-30: 1 mg via ORAL
  Filled 2017-01-30: qty 1

## 2017-01-30 MED ORDER — LORAZEPAM 1 MG PO TABS: 1.0000 mg | ORAL_TABLET | Freq: Three times a day (TID) | ORAL | 0 refills | Status: DC | PRN

## 2017-01-30 NOTE — ED Triage Notes (Signed)
Per pT, Pt reports being on opioids "for most of her life." About a month and a half ago, pt requested that she be taken off her medication per MD approval. Pt started to come off of medication. Pt reports having diarrhea bout one week ago that subsided, but pt reports "feeling anxious." Denies any chest pain or SOB. Denies any other symptoms.

## 2017-01-30 NOTE — Discharge Instructions (Signed)
Ativan as prescribed.  Follow-up with your primary Dr. next week to discuss your prescription medications.

## 2017-01-30 NOTE — ED Provider Notes (Signed)
[facility] DEPT Provider Note   CSN: [id number] Arrival date & time: 01/30/17  1701  By signing my name below, I, [name], attest that this documentation has been prepared under the direction and in the presence of Veryl Speak, MD . Electronically Signed: Ethelle Lyon [name], Scribe. 01/30/2017. 5:50 PM.   History   Chief Complaint Chief Complaint  Patient presents with  . Anxiety   The history is provided by the patient and a relative. No language interpreter was used.  Anxiety  This is a new problem. The current episode started more than 2 days ago. The problem occurs constantly. The problem has not changed since onset.Pertinent negatives include no chest pain and no shortness of breath. The symptoms are relieved by medications. She has tried nothing for the symptoms. The treatment provided no relief.    HPI Comments:  [name] is a morbidly obese 83 y.o. female with a PMHx of HLD, Anemia, HTN, Dm, Hepatitis, and Chronic diastolic CHF,  who presents to the Emergency Department complaining of anxiety onset this week. She states she has been on opiates for several years for back pain as she is an inoperative candidate. She asked her doctor to take her off the Hydrocodone a month and a half ago and during her detox last week, she had diarrhea for multiple days relieved by Pepto-Bismol. She is seen today feeling anxious about the whole situation as she stopped taking her Clonazepam because she ran out of the medication and cannot get it filled for eight more days. Her son notes she may have been taking multiple medications from other sources this week and could be in another stage of detox. She states the pain is affecting her eating habits. Pt denies CP, SOB, and any other complaints at this time. She states she does not consume EtOH.   Past Medical History:  Diagnosis Date  . Abdominal pain   . Anemia    NOS  . Anxiety   . Back pain   . Bruises easily   . Chronic diastolic  CHF (congestive heart failure) ([place])    a. 06/2013 EF 65-70%.  . Chronic headaches    HISTORY OF   . Depression   . Diabetes mellitus    type II  DIET CONTROLLED  . Diarrhea   . Hepatitis    AGE 30S  . Hyperlipidemia   . Hypertension   . Mild aortic stenosis    a. 06/2013 Echo: EF 65-70%, mod LVH with focal basal hypertrophy, very mild AS.  Marland Kitchen Osteoarthritis   . Oxygen desaturation during sleep    USES 2 LITERS BEDTIME   VIA CPAP 06/2012 [facility]   . Shortness of breath    WITH EXERTION USES 2 L O2 BEDTIME  . Sleep apnea    CPAP WITH O2 2 LITERS 2013 ([facility])  . Wears dentures   . Wears glasses     Patient Active Problem List   Diagnosis Date Noted  . Dementia arising in the senium and presenium 06/27/2016  . Obesity, Class II, BMI 35-39.9, with comorbidity 05/15/2016  . Insomnia w/ sleep apnea 10/16/2015  . Other constipation 03/16/2015  . IBS (irritable bowel syndrome) 02/27/2015  . Lumbar spondylosis 02/10/2015  . Lumbar radiculopathy 02/10/2015  . Osteoarthritis of right knee 02/10/2015  . Hashimoto's thyroiditis 05/05/2014  . Hyperglycemia 02/21/2014  . Adrenal incidentaloma ([place]) 11/04/2013  . Benign paroxysmal positional vertigo 07/15/2013  . Aortic stenosis, mild 07/14/2013  . OAB (overactive  bladder) 02/24/2013  . GERD with stricture 12/24/2012  . Sleep-related hypoventilation 07/30/2012  . OSA (obstructive sleep apnea) 04/16/2012  . Osteopenia 04/16/2012  . Preventative health care 05/24/2011  . VITAMIN D DEFICIENCY 12/04/2010  . Hyperlipidemia with target LDL less than 130 12/03/2010  . GAD (generalized anxiety disorder) 12/03/2010  . DEPRESSION 12/03/2010  . Essential hypertension 12/03/2010    Past Surgical History:  Procedure Laterality Date  . ABDOMINAL HYSTERECTOMY    . APPENDECTOMY    . CHOLECYSTECTOMY    . JOINT REPLACEMENT     LEFT KNEE   . KNEE ARTHROPLASTY Right 03/22/2015   Procedure: COMPUTER ASSISTED TOTAL KNEE ARTHROPLASTY;  Surgeon:  Marybelle Killings, MD;  Location: [facility];  Service: Orthopedics;  Laterality: Right;  . KNEE ARTHROSCOPY     RIGHT  . LEFT AND RIGHT HEART CATHETERIZATION WITH CORONARY ANGIOGRAM N/A 10/28/2013   Procedure: LEFT AND RIGHT HEART CATHETERIZATION WITH CORONARY ANGIOGRAM;  Surgeon: Blane Ohara, MD;  Location: [facility] CATH LAB;  Service: Cardiovascular;  Laterality: N/A;  . LEFT HEART CATHETERIZATION WITH CORONARY ANGIOGRAM N/A 11/14/2014   Procedure: LEFT HEART CATHETERIZATION WITH CORONARY ANGIOGRAM;  Surgeon: Josue Hector, MD;  Location[facility] CATH LAB;  Service: Cardiovascular;  Laterality: N/A;  . REVISION TOTAL KNEE ARTHROPLASTY  2011  . ROTATOR CUFF REPAIR  2010  . SHOULDER ARTHROSCOPY  09/17/2012   Procedure: ARTHROSCOPY SHOULDER;  Surgeon: Sharmon Revere, MD;  Location: [facility];  Service: Orthopedics;  Laterality: Left;  . TONSILLECTOMY    . TONSILLECTOMY      OB History    No data available       Home Medications    Prior to Admission medications   Medication Sig Start Date End Date Taking? Authorizing Provider  aspirin EC 325 MG tablet Take 325 mg by mouth daily.    Historical Provider, MD  calcium carbonate (OS-CAL) 600 MG TABS tablet Take 600 mg by mouth 2 (two) times daily with a meal.    Historical Provider, MD  carvedilol (COREG) 6.25 MG tablet Take 1 tablet (6.25 mg total) by mouth 2 (two) times daily with a meal. 06/11/16   [name], MD  chlorthalidone (HYGROTON) 25 MG tablet Take 1 tablet (25 mg total) by mouth daily. 06/27/16   [name], MD  cholecalciferol (VITAMIN D) 1000 units tablet Take 2,000 Units by mouth daily.    Historical Provider, MD  clonazePAM (KLONOPIN) 1 MG tablet Take 1 tablet (1 mg total) by mouth 2 (two) times daily. 11/11/16   [name], MD  hydrALAZINE (APRESOLINE) 50 MG tablet Take 1 tablet (50 mg total) by mouth 3 (three) times daily. 09/10/16   [name], MD  isosorbide mononitrate (ISMO,MONOKET) 20 MG tablet Take 1 tablet (20 mg total)  by mouth 2 (two) times daily at 10 AM and 5 PM. 12/11/16   [name], MD  meclizine (ANTIVERT) 25 MG tablet  11/20/16   Historical Provider, MD  meclizine (ANTIVERT) 25 MG tablet take 1 tablet by mouth three times a day if needed for dizziness 12/30/16   [name], MD  omeprazole (PRILOSEC) 40 MG capsule Take 1 capsule (40 mg total) by mouth daily. 12/11/16   [name], MD  oxyCODONE-acetaminophen (PERCOCET) 10-325 MG tablet Take 1 tablet by mouth every 8 (eight) hours as needed for pain. 12/17/16   [name], MD  traZODone (DESYREL) 100 MG tablet Take 2 tablets (200 mg total) by  mouth at bedtime. 12/11/16   [name], MD    Family History Family History  Problem Relation Age of Onset  . Cancer Father     prostate cancer  . Heart disease Sister   . Heart disease Brother   . Alcohol abuse Other   . Hypertension Other   . Kidney disease Other   . Mental illness Other   . Emphysema Brother     Social History Social History  Substance Use Topics  . Smoking status: Never Smoker  . Smokeless tobacco: Never Used  . Alcohol use No     Allergies   Amlodipine; Enalapril; Losartan potassium; Morphine and related; and Metformin   Review of Systems Review of Systems  Constitutional: Positive for appetite change.  Respiratory: Negative for shortness of breath.   Cardiovascular: Negative for chest pain.  Musculoskeletal: Positive for back pain.  Psychiatric/Behavioral: The patient is nervous/anxious.   All other systems reviewed and are negative.    Physical Exam Updated Vital Signs BP 114/70 (BP Location: Left Arm)   Pulse 88   Temp 97.5 F (36.4 C) (Oral)   Resp 18   Ht 5\' 2"  (1.575 m)   Wt 195 lb (88.5 kg)   SpO2 98%   BMI 35.67 kg/m   Physical Exam  Constitutional: She appears well-developed and well-nourished. No distress.  Appears somewhat anxious.   HENT:  Head: Normocephalic and atraumatic.  Mouth/Throat: Oropharynx is clear and moist. No  oropharyngeal exudate.  Eyes: Conjunctivae are normal. Pupils are equal, round, and reactive to light. Right eye exhibits no discharge. Left eye exhibits no discharge. No scleral icterus.  Neck: Normal range of motion. Neck supple. No thyromegaly present.  Cardiovascular: Normal rate, regular rhythm, normal heart sounds and intact distal pulses.  Exam reveals no gallop and no friction rub.   No murmur heard. Pulmonary/Chest: Effort normal and breath sounds normal. No stridor. No respiratory distress. She has no wheezes. She has no rales.  Abdominal: Soft. Bowel sounds are normal. She exhibits no distension. There is no tenderness. There is no rebound and no guarding.  Musculoskeletal: She exhibits no edema.  Lymphadenopathy:    She has no cervical adenopathy.  Neurological: She is alert. Coordination normal.  Skin: Skin is warm and dry. No rash noted. She is not diaphoretic. No pallor.  Psychiatric: She has a normal mood and affect.  Nursing note and vitals reviewed.    ED Treatments / Results  DIAGNOSTIC STUDIES:  Oxygen Saturation is 98% on RA, normal by my interpretation.    COORDINATION OF CARE:  5:49 PM Discussed treatment plan with pt at bedside including 1mg  Ativan and pt agreed to plan.  Labs (all labs ordered are listed, but only abnormal results are displayed) Labs Reviewed - No data to display  EKG  EKG Interpretation None       Radiology No results found.  Procedures Procedures (including critical care time)  Medications Ordered in ED Medications - No data to display   Initial Impression / Assessment and Plan / ED Course  I have reviewed the triage vital signs and the nursing notes.  Pertinent labs & imaging results that were available during my care of the patient were reviewed by me and considered in my medical decision making (see chart for details).  Patient with anxiety and apparent withdrawal from Running out of her clonazepam. She has apparently  been taking additional pills as she has been coming off of her pain medication as well.  She was given Ativan here in the emergency department and her symptoms are markedly improved. I will prescribe a small quantity of Ativan that she can take until she can follow-up with her primary Dr. For her refill.  Final Clinical Impressions(s) / ED Diagnoses   Final diagnoses:  None    New Prescriptions New Prescriptions   No medications on file   I personally performed the services described in this documentation, which was scribed in my presence. The recorded information has been reviewed and is accurate.        Veryl Speak, MD 01/30/17 2000

## 2017-02-03 ENCOUNTER — Emergency Department (HOSPITAL_COMMUNITY)
Admission: EM | Admit: 2017-02-03 | Discharge: 2017-02-04 | Disposition: A | Discharge status: Other Acute Inpatient Hospital | Payer: Medicare HMO | Attending: Emergency Medicine | Admitting: Emergency Medicine

## 2017-02-03 ENCOUNTER — Encounter (HOSPITAL_COMMUNITY): Payer: Self-pay

## 2017-02-03 ENCOUNTER — Ambulatory Visit (INDEPENDENT_AMBULATORY_CARE_PROVIDER_SITE_OTHER): Payer: Medicare HMO | Admitting: Internal Medicine

## 2017-02-03 ENCOUNTER — Encounter: Payer: Self-pay | Admitting: Internal Medicine

## 2017-02-03 ENCOUNTER — Emergency Department (HOSPITAL_COMMUNITY): Payer: Medicare HMO

## 2017-02-03 VITALS — BP 170/98 | HR 67 | Temp 98.1°F | Resp 16 | Ht 62.0 in | Wt 192.8 lb

## 2017-02-03 DIAGNOSIS — I700 Atherosclerosis of aorta: Secondary | ICD-10-CM | POA: Diagnosis not present

## 2017-02-03 DIAGNOSIS — F332 Major depressive disorder, recurrent severe without psychotic features: Secondary | ICD-10-CM | POA: Diagnosis not present

## 2017-02-03 DIAGNOSIS — I5032 Chronic diastolic (congestive) heart failure: Secondary | ICD-10-CM | POA: Insufficient documentation

## 2017-02-03 DIAGNOSIS — Z0000 Encounter for general adult medical examination without abnormal findings: Secondary | ICD-10-CM

## 2017-02-03 DIAGNOSIS — I10 Essential (primary) hypertension: Secondary | ICD-10-CM | POA: Diagnosis not present

## 2017-02-03 DIAGNOSIS — I110 Hypertensive heart disease with heart failure: Secondary | ICD-10-CM | POA: Diagnosis not present

## 2017-02-03 DIAGNOSIS — F339 Major depressive disorder, recurrent, unspecified: Secondary | ICD-10-CM

## 2017-02-03 DIAGNOSIS — F331 Major depressive disorder, recurrent, moderate: Secondary | ICD-10-CM

## 2017-02-03 DIAGNOSIS — Z96653 Presence of artificial knee joint, bilateral: Secondary | ICD-10-CM | POA: Insufficient documentation

## 2017-02-03 DIAGNOSIS — M47816 Spondylosis without myelopathy or radiculopathy, lumbar region: Secondary | ICD-10-CM | POA: Diagnosis not present

## 2017-02-03 DIAGNOSIS — R319 Hematuria, unspecified: Secondary | ICD-10-CM | POA: Diagnosis not present

## 2017-02-03 DIAGNOSIS — Z818 Family history of other mental and behavioral disorders: Secondary | ICD-10-CM | POA: Diagnosis not present

## 2017-02-03 DIAGNOSIS — Z811 Family history of alcohol abuse and dependence: Secondary | ICD-10-CM | POA: Diagnosis not present

## 2017-02-03 DIAGNOSIS — R5383 Other fatigue: Secondary | ICD-10-CM | POA: Diagnosis not present

## 2017-02-03 DIAGNOSIS — M1711 Unilateral primary osteoarthritis, right knee: Secondary | ICD-10-CM | POA: Diagnosis not present

## 2017-02-03 DIAGNOSIS — E119 Type 2 diabetes mellitus without complications: Secondary | ICD-10-CM | POA: Insufficient documentation

## 2017-02-03 DIAGNOSIS — R45851 Suicidal ideations: Secondary | ICD-10-CM | POA: Insufficient documentation

## 2017-02-03 DIAGNOSIS — F411 Generalized anxiety disorder: Secondary | ICD-10-CM | POA: Diagnosis not present

## 2017-02-03 DIAGNOSIS — N390 Urinary tract infection, site not specified: Secondary | ICD-10-CM | POA: Diagnosis not present

## 2017-02-03 DIAGNOSIS — M5416 Radiculopathy, lumbar region: Secondary | ICD-10-CM

## 2017-02-03 DIAGNOSIS — Z79899 Other long term (current) drug therapy: Secondary | ICD-10-CM | POA: Insufficient documentation

## 2017-02-03 DIAGNOSIS — R4182 Altered mental status, unspecified: Secondary | ICD-10-CM | POA: Diagnosis present

## 2017-02-03 DIAGNOSIS — Z7982 Long term (current) use of aspirin: Secondary | ICD-10-CM | POA: Diagnosis not present

## 2017-02-03 LAB — URINALYSIS, ROUTINE W REFLEX MICROSCOPIC
Bilirubin Urine: NEGATIVE
GLUCOSE, UA: NEGATIVE mg/dL
Ketones, ur: NEGATIVE mg/dL
NITRITE: NEGATIVE
PROTEIN: NEGATIVE mg/dL
Specific Gravity, Urine: 1.011 (ref 1.005–1.030)
pH: 5 (ref 5.0–8.0)

## 2017-02-03 LAB — COMPREHENSIVE METABOLIC PANEL
ALT: 24 U/L (ref 14–54)
ANION GAP: 8 (ref 5–15)
AST: 23 U/L (ref 15–41)
Albumin: 4 g/dL (ref 3.5–5.0)
Alkaline Phosphatase: 41 U/L (ref 38–126)
BILIRUBIN TOTAL: 1 mg/dL (ref 0.3–1.2)
BUN: 10 mg/dL (ref 6–20)
CO2: 25 mmol/L (ref 22–32)
Calcium: 9.6 mg/dL (ref 8.9–10.3)
Chloride: 108 mmol/L (ref 101–111)
Creatinine, Ser: 0.56 mg/dL (ref 0.44–1.00)
Glucose, Bld: 100 mg/dL — ABNORMAL HIGH (ref 65–99)
Potassium: 3.1 mmol/L — ABNORMAL LOW (ref 3.5–5.1)
Sodium: 141 mmol/L (ref 135–145)
TOTAL PROTEIN: 7.1 g/dL (ref 6.5–8.1)

## 2017-02-03 LAB — COOXEMETRY PANEL
Carboxyhemoglobin: 1.3 % (ref 0.5–1.5)
METHEMOGLOBIN: 0.8 % (ref 0.0–1.5)
O2 Saturation: 88.9 %
Total hemoglobin: 14.3 g/dL (ref 12.0–16.0)

## 2017-02-03 LAB — CBC
HEMATOCRIT: 40.2 % (ref 36.0–46.0)
HEMOGLOBIN: 13.2 g/dL (ref 12.0–15.0)
MCH: 30.1 pg (ref 26.0–34.0)
MCHC: 32.8 g/dL (ref 30.0–36.0)
MCV: 91.8 fL (ref 78.0–100.0)
Platelets: 154 10*3/uL (ref 150–400)
RBC: 4.38 MIL/uL (ref 3.87–5.11)
RDW: 13.3 % (ref 11.5–15.5)
WBC: 10.7 10*3/uL — AB (ref 4.0–10.5)

## 2017-02-03 LAB — TSH: TSH: 2.695 u[IU]/mL (ref 0.350–4.500)

## 2017-02-03 LAB — CBG MONITORING, ED: Glucose-Capillary: 97 mg/dL (ref 65–99)

## 2017-02-03 MED ORDER — CLONAZEPAM 1 MG PO TABS
1.0000 mg | ORAL_TABLET | Freq: Two times a day (BID) | ORAL | Status: DC
  Administered 2017-02-03: 1 mg via ORAL
  Filled 2017-02-03: qty 1
  Filled 2017-02-03: qty 2

## 2017-02-03 MED ORDER — CHLORTHALIDONE 25 MG PO TABS
25.0000 mg | ORAL_TABLET | Freq: Every day | ORAL | Status: DC
  Administered 2017-02-03 – 2017-02-04 (×2): 25 mg via ORAL
  Filled 2017-02-03 (×3): qty 1

## 2017-02-03 MED ORDER — ASPIRIN EC 325 MG PO TBEC
325.0000 mg | DELAYED_RELEASE_TABLET | Freq: Every day | ORAL | Status: DC
  Administered 2017-02-04: 325 mg via ORAL
  Filled 2017-02-03: qty 1

## 2017-02-03 MED ORDER — ISOSORBIDE MONONITRATE 20 MG PO TABS
20.0000 mg | ORAL_TABLET | Freq: Two times a day (BID) | ORAL | Status: DC
  Administered 2017-02-03 – 2017-02-04 (×2): 20 mg via ORAL
  Filled 2017-02-03 (×4): qty 1

## 2017-02-03 MED ORDER — POTASSIUM CHLORIDE CRYS ER 20 MEQ PO TBCR
40.0000 meq | EXTENDED_RELEASE_TABLET | Freq: Once | ORAL | Status: AC
  Administered 2017-02-03: 40 meq via ORAL
  Filled 2017-02-03: qty 2

## 2017-02-03 MED ORDER — PANTOPRAZOLE SODIUM 40 MG PO TBEC
40.0000 mg | DELAYED_RELEASE_TABLET | Freq: Every day | ORAL | Status: DC
  Administered 2017-02-03 – 2017-02-04 (×2): 40 mg via ORAL
  Filled 2017-02-03 (×2): qty 1

## 2017-02-03 MED ORDER — CEPHALEXIN 500 MG PO CAPS
500.0000 mg | ORAL_CAPSULE | Freq: Three times a day (TID) | ORAL | Status: DC
  Administered 2017-02-03 – 2017-02-04 (×3): 500 mg via ORAL
  Filled 2017-02-03 (×3): qty 1

## 2017-02-03 MED ORDER — TRAZODONE HCL 100 MG PO TABS: 100.0000 mg | ORAL_TABLET | Freq: Every evening | ORAL | Status: DC | PRN

## 2017-02-03 MED ORDER — CEPHALEXIN 500 MG PO CAPS: 500.0000 mg | ORAL_CAPSULE | Freq: Three times a day (TID) | ORAL | Status: DC

## 2017-02-03 MED ORDER — CARVEDILOL 6.25 MG PO TABS
6.2500 mg | ORAL_TABLET | Freq: Two times a day (BID) | ORAL | Status: DC
  Administered 2017-02-03 – 2017-02-04 (×3): 6.25 mg via ORAL
  Filled 2017-02-03 (×5): qty 1

## 2017-02-03 MED ORDER — OXYCODONE-ACETAMINOPHEN 10-325 MG PO TABS: 1.0000 | ORAL_TABLET | Freq: Three times a day (TID) | ORAL | 0 refills | Status: DC | PRN

## 2017-02-03 MED ORDER — HYDRALAZINE HCL 50 MG PO TABS
50.0000 mg | ORAL_TABLET | Freq: Three times a day (TID) | ORAL | Status: DC
  Administered 2017-02-03 – 2017-02-04 (×2): 50 mg via ORAL
  Filled 2017-02-03 (×3): qty 1

## 2017-02-03 MED ORDER — CLONAZEPAM 1 MG PO TABS: 1.0000 mg | ORAL_TABLET | Freq: Two times a day (BID) | ORAL | 3 refills | Status: DC
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
## 2017-02-03 NOTE — Progress Notes (Signed)
Subjective:  Patient ID: [name], female    DOB: June 06, 1933  Age: 83 y.o. MRN: [id number]  CC: Hypertension and Osteoarthritis   HPI [name] presents for Follow-up after recently being seen in the emergency room for what she described as detox symptoms. When I last saw her about 6 or 8 weeks ago she and her family were concerned that the narcotics that she takes were affecting her mental abilities. At that time we discussed a slow taper of the opiate as well as the benzodiazepine. At some point along the way she ran out of both of these medications and subsequently did not feel well. It appears she is having trouble managing her medications and will not let anyone into the home to help her with medication management. She recently ran out of Klonopin apparently because she was taking it too frequently. She also thought she was supposed to abruptly stop the oxycodone and at some point in the last week ot 2 she stopped it and then started feeling poorly with severe anxiety, diffuse pain, diarrhea, depression, and feeling helpless.  No facility-administered medications prior to visit.    Outpatient Medications Prior to Visit  Medication Sig Dispense Refill  . aspirin EC 325 MG tablet Take 325 mg by mouth daily.    . calcium carbonate (OS-CAL) 600 MG TABS tablet Take 600 mg by mouth 2 (two) times daily with a meal.    . carvedilol (COREG) 6.25 MG tablet Take 1 tablet (6.25 mg total) by mouth 2 (two) times daily with a meal. 60 tablet 11  . chlorthalidone (HYGROTON) 25 MG tablet Take 1 tablet (25 mg total) by mouth daily. 30 tablet 11  . cholecalciferol (VITAMIN D) 1000 units tablet Take 2,000 Units by mouth daily.    . hydrALAZINE (APRESOLINE) 50 MG tablet Take 1 tablet (50 mg total) by mouth 3 (three) times daily. 270 tablet 3  . isosorbide mononitrate (ISMO,MONOKET) 20 MG tablet Take 1 tablet (20 mg total) by mouth 2 (two) times daily at 10 AM and 5 PM. 180 tablet 3  . omeprazole  (PRILOSEC) 40 MG capsule Take 1 capsule (40 mg total) by mouth daily. (Patient taking differently: Take 40 mg by mouth daily as needed (heartburn). ) 90 capsule 3  . traZODone (DESYREL) 100 MG tablet Take 2 tablets (200 mg total) by mouth at bedtime. (Patient taking differently: Take 100 mg by mouth at bedtime as needed for sleep. ) 180 tablet 3  . clonazePAM (KLONOPIN) 1 MG tablet Take 1 tablet (1 mg total) by mouth 2 (two) times daily. (Patient not taking: Reported on 02/03/2017) 60 tablet 3  . LORazepam (ATIVAN) 1 MG tablet Take 1 tablet (1 mg total) by mouth every 8 (eight) hours as needed for anxiety. (Patient not taking: Reported on 02/03/2017) 12 tablet 0  . meclizine (ANTIVERT) 25 MG tablet     . meclizine (ANTIVERT) 25 MG tablet take 1 tablet by mouth three times a day if needed for dizziness (Patient not taking: Reported on 02/03/2017) 60 tablet 2  . oxyCODONE-acetaminophen (PERCOCET) 10-325 MG tablet Take 1 tablet by mouth every 8 (eight) hours as needed for pain. 90 tablet 0    ROS Review of Systems  Constitutional: Positive for appetite change (poor appetite), fatigue and unexpected weight change (slight wt loss).  HENT: Negative.  Negative for facial swelling, sinus pressure, sore throat and trouble swallowing.   Eyes: Negative for visual disturbance.  Respiratory: Negative for cough, chest tightness,  shortness of breath and stridor.   Cardiovascular: Negative for chest pain, palpitations and leg swelling.  Gastrointestinal: Positive for diarrhea. Negative for abdominal pain, anal bleeding, blood in stool, nausea and vomiting.  Endocrine: Negative.   Genitourinary: Negative.   Musculoskeletal: Positive for arthralgias, back pain and myalgias. Negative for gait problem, joint swelling, neck pain and neck stiffness.  Skin: Negative.   Allergic/Immunologic: Negative.   Neurological: Positive for weakness (she feels weak all over). Negative for dizziness, light-headedness, numbness and  headaches.  Hematological: Negative for adenopathy. Does not bruise/bleed easily.  Psychiatric/Behavioral: Positive for confusion, decreased concentration, dysphoric mood and sleep disturbance. Negative for agitation, behavioral problems, hallucinations, self-injury and suicidal ideas. The patient is nervous/anxious. The patient is not hyperactive.     Objective:  BP (!) 170/98 (BP Location: Left Arm, Patient Position: Sitting, Cuff Size: Large)   Pulse 67   Temp 98.1 F (36.7 C) (Oral)   Resp 16   Ht 5\' 2"  (1.575 m)   Wt 192 lb 12 oz (87.4 kg)   SpO2 97%   BMI 35.25 kg/m   BP Readings from Last 3 Encounters:  02/04/17 185/75  02/03/17 (!) 170/98  01/30/17 114/70    Wt Readings from Last 3 Encounters:  02/03/17 192 lb (87.1 kg)  02/03/17 192 lb 12 oz (87.4 kg)  01/30/17 195 lb (88.5 kg)    Physical Exam  Lab Results  Component Value Date   WBC 10.7 (H) 02/03/2017   HGB 13.2 02/03/2017   HCT 40.2 02/03/2017   PLT 154 02/03/2017   GLUCOSE 100 (H) 02/03/2017   CHOL 222 (H) 06/11/2016   TRIG 279.0 (H) 06/11/2016   HDL 54.20 06/11/2016   LDLDIRECT 114.0 06/11/2016   LDLCALC 106 (H) 05/16/2015   ALT 24 02/03/2017   AST 23 02/03/2017   NA 141 02/03/2017   K 3.1 (L) 02/03/2017   CL 108 02/03/2017   CREATININE 0.56 02/03/2017   BUN 10 02/03/2017   CO2 25 02/03/2017   TSH 2.695 02/03/2017   INR 1.01 03/10/2015   HGBA1C 5.0 05/16/2015    No results found.  Assessment & Plan:   [name] was seen today for hypertension and osteoarthritis.  Diagnoses and all orders for this visit:  Lumbar spondylosis -     Discontinue: oxyCODONE-acetaminophen (PERCOCET) 10-325 MG tablet; Take 1 tablet by mouth every 8 (eight) hours as needed for pain. -     oxyCODONE-acetaminophen (PERCOCET) 10-325 MG tablet; Take 1 tablet by mouth every 8 (eight) hours as needed for pain.  Primary osteoarthritis of right knee- tapering off of Percocet has not gone well, she complains of severe pain  so will restart Percocet at 3 doses a day as needed. In the next few months will reconsider a furthuring tapering the dose. -     Discontinue: oxyCODONE-acetaminophen (PERCOCET) 10-325 MG tablet; Take 1 tablet by mouth every 8 (eight) hours as needed for pain. -     oxyCODONE-acetaminophen (PERCOCET) 10-325 MG tablet; Take 1 tablet by mouth every 8 (eight) hours as needed for pain.  Lumbar radiculopathy -     Discontinue: oxyCODONE-acetaminophen (PERCOCET) 10-325 MG tablet; Take 1 tablet by mouth every 8 (eight) hours as needed for pain. -     oxyCODONE-acetaminophen (PERCOCET) 10-325 MG tablet; Take 1 tablet by mouth every 8 (eight) hours as needed for pain.  GAD (generalized anxiety disorder)- she is experiencing severe withdrawal symptoms so I've asked her to restart the Klonopin at the stated dose of 1  tablet twice a day. -     clonazePAM (KLONOPIN) 1 MG tablet; Take 1 tablet (1 mg total) by mouth 2 (two) times daily.  Essential hypertension- her blood pressure was previously well controlled but is now not well controlled and I think this is related to the withdrawal and will therefore not change her antihypertensive regimen, have asked her to return in 3 weeks for blood pressure check and at that time may consider adjusting her antihypertensive regimen.   I have discontinued Ms. Lohr's meclizine, meclizine, and LORazepam. I am also having her maintain her calcium carbonate, carvedilol, cholecalciferol, aspirin EC, chlorthalidone, hydrALAZINE, traZODone, isosorbide mononitrate, omeprazole, MYRBETRIQ, clonazePAM, and oxyCODONE-acetaminophen.  Meds ordered this encounter  Medications  . MYRBETRIQ 25 MG TB24 tablet    Sig: Take 25 mg by mouth at bedtime as needed (overactive bladder).   . clonazePAM (KLONOPIN) 1 MG tablet    Sig: Take 1 tablet (1 mg total) by mouth 2 (two) times daily.    Dispense:  60 tablet    Refill:  3  . DISCONTD: oxyCODONE-acetaminophen (PERCOCET) 10-325 MG tablet     Sig: Take 1 tablet by mouth every 8 (eight) hours as needed for pain.    Dispense:  90 tablet    Refill:  0    Fill on or after 02/03/17  . oxyCODONE-acetaminophen (PERCOCET) 10-325 MG tablet    Sig: Take 1 tablet by mouth every 8 (eight) hours as needed for pain.    Dispense:  90 tablet    Refill:  0    Fill on or after 03/06/17     Follow-up: Return in about 3 weeks (around 02/24/2017).  Scarlette Calico, MD

## 2017-02-03 NOTE — ED Notes (Signed)
Pt ambulatory to bathroom for urine sample.

## 2017-02-03 NOTE — ED Notes (Signed)
Bedside reporting done with Ledell Noss, RN.

## 2017-02-03 NOTE — Progress Notes (Signed)
Pre visit review using our clinic review tool, if applicable. No additional management support is needed unless otherwise documented below in the visit note. 

## 2017-02-03 NOTE — Patient Instructions (Signed)

## 2017-02-03 NOTE — ED Triage Notes (Addendum)
PT BROUGHT BY HER SON FOR CONFUSION. PER THE SON, WHEN HE ARRIVED AT HER HOUSE, THE STOVE WAS ON AND THE SHOWER WAS RUNNING, BUT THE PT DID NOT REMEMBER TURNING THEM ON. PER THE SON, THE PT WAS STS SHE DID NOT WANT TO LIVE ANYMORE AND SHE WAS TIRED OF THE PAIN. THE PT HAS BEEN WEANED OFF OF OPIOIDS X2 WEEKS FOR CHRONIC BACK PAIN. PT STS HER WHOLE FACE FEELS NUMB. AAOX1. THE SON IS CONCERNED ABOUT POSSIBLE WITHDRAWAL FROM THE MEDICATIONS.

## 2017-02-03 NOTE — ED Provider Notes (Signed)
[facility] DEPT Provider Note   CSN: [id number] Arrival date & time: 02/03/17  1654     History   Chief Complaint Chief Complaint  Patient presents with  . Altered Mental Status    HPI [name] is a 83 y.o. female.  The history is provided by the patient and a relative.  Mental Health Problem  Presenting symptoms: depression, disorganized thought process and suicidal thoughts (no plan)   Presenting symptoms: no hallucinations, no homicidal ideas, no suicidal threats and no suicide attempt   Patient accompanied by: relative. Degree of incapacity (severity):  Moderate Onset quality:  Gradual Duration:  2 weeks Timing:  Constant Progression:  Unchanged Chronicity:  New Context comment:  Opiate use for chronic pain, polypharmacy Treatment compliance:  All of the time Relieved by:  Nothing Worsened by:  Nothing Ineffective treatments:  None tried Associated symptoms: anhedonia, anxiety, fatigue and feelings of worthlessness   Risk factors: hx of mental illness   Risk factors: no recent psychiatric admission     Past Medical History:  Diagnosis Date  . Abdominal pain   . Anemia    NOS  . Anxiety   . Back pain   . Bruises easily   . Chronic diastolic CHF (congestive heart failure) ([place])    a. 06/2013 EF 65-70%.  . Chronic headaches    HISTORY OF   . Depression   . Diabetes mellitus    type II  DIET CONTROLLED  . Diarrhea   . Hepatitis    AGE 30S  . Hyperlipidemia   . Hypertension   . Mild aortic stenosis    a. 06/2013 Echo: EF 65-70%, mod LVH with focal basal hypertrophy, very mild AS.  Marland Kitchen Osteoarthritis   . Oxygen desaturation during sleep    USES 2 LITERS BEDTIME   VIA CPAP 06/2012 [facility]   . Shortness of breath    WITH EXERTION USES 2 L O2 BEDTIME  . Sleep apnea    CPAP WITH O2 2 LITERS 2013 ([facility])  . Wears dentures   . Wears glasses     Patient Active Problem List   Diagnosis Date Noted  . Dementia arising in the senium and presenium  06/27/2016  . Obesity, Class II, BMI 35-39.9, with comorbidity 05/15/2016  . Insomnia w/ sleep apnea 10/16/2015  . Other constipation 03/16/2015  . IBS (irritable bowel syndrome) 02/27/2015  . Lumbar spondylosis 02/10/2015  . Lumbar radiculopathy 02/10/2015  . Osteoarthritis of right knee 02/10/2015  . Hashimoto's thyroiditis 05/05/2014  . Hyperglycemia 02/21/2014  . Adrenal incidentaloma ([place]) 11/04/2013  . Benign paroxysmal positional vertigo 07/15/2013  . Aortic stenosis, mild 07/14/2013  . OAB (overactive bladder) 02/24/2013  . GERD with stricture 12/24/2012  . Sleep-related hypoventilation 07/30/2012  . OSA (obstructive sleep apnea) 04/16/2012  . Osteopenia 04/16/2012  . Preventative health care 05/24/2011  . VITAMIN D DEFICIENCY 12/04/2010  . Hyperlipidemia with target LDL less than 130 12/03/2010  . GAD (generalized anxiety disorder) 12/03/2010  . DEPRESSION 12/03/2010  . Essential hypertension 12/03/2010    Past Surgical History:  Procedure Laterality Date  . ABDOMINAL HYSTERECTOMY    . APPENDECTOMY    . CHOLECYSTECTOMY    . JOINT REPLACEMENT     LEFT KNEE   . KNEE ARTHROPLASTY Right 03/22/2015   Procedure: COMPUTER ASSISTED TOTAL KNEE ARTHROPLASTY;  Surgeon: Marybelle Killings, MD;  Location: [facility];  Service: Orthopedics;  Laterality: Right;  . KNEE ARTHROSCOPY     RIGHT  . LEFT  AND RIGHT HEART CATHETERIZATION WITH CORONARY ANGIOGRAM N/A 10/28/2013   Procedure: LEFT AND RIGHT HEART CATHETERIZATION WITH CORONARY ANGIOGRAM;  Surgeon: Blane Ohara, MD;  Location: [facility] CATH LAB;  Service: Cardiovascular;  Laterality: N/A;  . LEFT HEART CATHETERIZATION WITH CORONARY ANGIOGRAM N/A 11/14/2014   Procedure: LEFT HEART CATHETERIZATION WITH CORONARY ANGIOGRAM;  Surgeon: Josue Hector, MD;  Location[facility] CATH LAB;  Service: Cardiovascular;  Laterality: N/A;  . REVISION TOTAL KNEE ARTHROPLASTY  2011  . ROTATOR CUFF REPAIR  2010  . SHOULDER ARTHROSCOPY  09/17/2012   Procedure:  ARTHROSCOPY SHOULDER;  Surgeon: Sharmon Revere, MD;  Location: [facility];  Service: Orthopedics;  Laterality: Left;  . TONSILLECTOMY    . TONSILLECTOMY      OB History    No data available       Home Medications    Prior to Admission medications   Medication Sig Start Date End Date Taking? Authorizing Provider  aspirin EC 325 MG tablet Take 325 mg by mouth daily.    Historical Provider, MD  calcium carbonate (OS-CAL) 600 MG TABS tablet Take 600 mg by mouth 2 (two) times daily with a meal.    Historical Provider, MD  carvedilol (COREG) 6.25 MG tablet Take 1 tablet (6.25 mg total) by mouth 2 (two) times daily with a meal. 06/11/16   [name], MD  chlorthalidone (HYGROTON) 25 MG tablet Take 1 tablet (25 mg total) by mouth daily. 06/27/16   [name], MD  cholecalciferol (VITAMIN D) 1000 units tablet Take 2,000 Units by mouth daily.    Historical Provider, MD  clonazePAM (KLONOPIN) 1 MG tablet Take 1 tablet (1 mg total) by mouth 2 (two) times daily. 02/03/17   [name], MD  hydrALAZINE (APRESOLINE) 50 MG tablet Take 1 tablet (50 mg total) by mouth 3 (three) times daily. 09/10/16   [name], MD  isosorbide mononitrate (ISMO,MONOKET) 20 MG tablet Take 1 tablet (20 mg total) by mouth 2 (two) times daily at 10 AM and 5 PM. 12/11/16   [name], MD  MYRBETRIQ 25 MG TB24 tablet  01/27/17   Historical Provider, MD  omeprazole (PRILOSEC) 40 MG capsule Take 1 capsule (40 mg total) by mouth daily. 12/11/16   [name], MD  oxyCODONE-acetaminophen (PERCOCET) 10-325 MG tablet Take 1 tablet by mouth every 8 (eight) hours as needed for pain. 02/03/17   [name], MD  traZODone (DESYREL) 100 MG tablet Take 2 tablets (200 mg total) by mouth at bedtime. 12/11/16   [name], MD    Family History Family History  Problem Relation Age of Onset  . Cancer Father     prostate cancer  . Heart disease Sister   . Heart disease Brother   . Alcohol abuse Other   . Hypertension  Other   . Kidney disease Other   . Mental illness Other   . Emphysema Brother     Social History Social History  Substance Use Topics  . Smoking status: Never Smoker  . Smokeless tobacco: Never Used  . Alcohol use No     Allergies   Amlodipine; Enalapril; Losartan potassium; Morphine and related; and Metformin   Review of Systems Review of Systems  Constitutional: Positive for fatigue.  Psychiatric/Behavioral: Positive for suicidal ideas (no plan). Negative for hallucinations and homicidal ideas. The patient is nervous/anxious.   All other systems reviewed and are negative.    Physical Exam Updated Vital Signs BP 173/96 (  BP Location: Left Arm)   Pulse (!) 59   Temp 98.1 F (36.7 C) (Oral)   Resp 15   Ht 5\' 2"  (1.575 m)   Wt 192 lb (87.1 kg)   SpO2 96%   BMI 35.12 kg/m   Physical Exam  Constitutional: She is oriented to person, place, and time. She appears well-developed and well-nourished. No distress.  HENT:  Head: Normocephalic.  Nose: Nose normal.  Eyes: Conjunctivae are normal.  Neck: Neck supple. No tracheal deviation present.  Cardiovascular: Normal rate, regular rhythm and normal heart sounds.   Pulmonary/Chest: Effort normal and breath sounds normal. No respiratory distress.  Abdominal: Soft. She exhibits no distension. There is no tenderness. There is no rebound and no guarding.  Neurological: She is alert and oriented to person, place, and time. No cranial nerve deficit or sensory deficit. Coordination normal.  Skin: Skin is warm and dry. Capillary refill takes less than 2 seconds.  Psychiatric: She is slowed and withdrawn. She exhibits a depressed mood.  Vitals reviewed.    ED Treatments / Results  Labs (all labs ordered are listed, but only abnormal results are displayed) Labs Reviewed  COMPREHENSIVE METABOLIC PANEL - Abnormal; Notable for the following:       Result Value   Potassium 3.1 (*)    Glucose, Bld 100 (*)    All other components  within normal limits  CBC - Abnormal; Notable for the following:    WBC 10.7 (*)    All other components within normal limits  URINALYSIS, ROUTINE W REFLEX MICROSCOPIC - Abnormal; Notable for the following:    Hgb urine dipstick MODERATE (*)    Leukocytes, UA LARGE (*)    Bacteria, UA MANY (*)    Squamous Epithelial / LPF 0-5 (*)    All other components within normal limits  URINE CULTURE  COOXEMETRY PANEL  TSH  CBG MONITORING, ED    EKG  EKG Interpretation None       Radiology No results found.  Procedures Procedures (including critical care time)  Medications Ordered in ED Medications  cephALEXin (KEFLEX) capsule 500 mg (500 mg Oral Given 02/03/17 2103)  aspirin EC tablet 325 mg (not administered)  carvedilol (COREG) tablet 6.25 mg (6.25 mg Oral Given 02/03/17 2103)  chlorthalidone (HYGROTON) tablet 25 mg (25 mg Oral Given 02/03/17 2103)  clonazePAM (KLONOPIN) tablet 1 mg (1 mg Oral Given 02/03/17 2107)  hydrALAZINE (APRESOLINE) tablet 50 mg (50 mg Oral Given 02/03/17 2103)  isosorbide mononitrate (ISMO,MONOKET) tablet 20 mg (20 mg Oral Given 02/03/17 2110)  pantoprazole (PROTONIX) EC tablet 40 mg (40 mg Oral Given 02/03/17 2107)  traZODone (DESYREL) tablet 100 mg (not administered)  potassium chloride SA (K-DUR,KLOR-CON) CR tablet 40 mEq (40 mEq Oral Given 02/03/17 1851)     Initial Impression / Assessment and Plan / ED Course  I have reviewed the triage vital signs and the nursing notes.  Pertinent labs & imaging results that were available during my care of the patient were reviewed by me and considered in my medical decision making (see chart for details).     83 year old female presents with passive suicidal ideation from home. She states "I just don't want to be here anymore". She states that she has thought about suicide a lot recently but has not come up with a plan. She just "wants it to be easy". She deals with chronic pain on daily basis and has been on  narcotic pain medications. She stopped taking them last week  when she ran out and her family was concerned that she may be with an withdrawal. She saw her primary care doctor today and was refilled for those medications but has not taken any of them. Family is concerned that they found her to be slowed at home leaving the stove on and appearing depressed.   Extensive lab workup was performed to evaluate for organic causes of depression and anhedonia and Pt may have urinary tract infection. Will treat empirically with keflex pending culture.   Patient is alert and oriented without any neurologic deficits to suggest an indication for imaging on an emergent basis. I suspect the patient's chronic pain has been causing her to become increasingly depressed leading to her current suicidal thoughts. She agrees to psychiatric consultation here to evaluate for safety and qualification for inpatient versus outpatient therapy and medication. TTS to see.  Patient will be treated with 10 days of Keflex three times a day empirically. Psychiatry is planning to see her in the morning to review her medications and evaluate her.  MEDICALLY CLEAR FOR DISCHARGE, TRANSFER OR PSYCHIATRIC ADMISSION.   Final Clinical Impressions(s) / ED Diagnoses   Final diagnoses:  Moderate episode of recurrent major depressive disorder ([place])  Suicidal thoughts    New Prescriptions New Prescriptions   No medications on file     Leo Grosser, MD 02/03/17 2217

## 2017-02-03 NOTE — ED Notes (Signed)
Delayed med admin, as pt is having TTS consult performed.

## 2017-02-03 NOTE — ED Notes (Addendum)
Pt is alert and oriented x 4 patient family is at bedside. Pt BP is elevated at 208/125, made Dr. Laneta Simmers aware. Pt reports generalized pain

## 2017-02-04 ENCOUNTER — Emergency Department (HOSPITAL_COMMUNITY): Payer: Medicare HMO

## 2017-02-04 DIAGNOSIS — Z79899 Other long term (current) drug therapy: Secondary | ICD-10-CM

## 2017-02-04 DIAGNOSIS — Z7982 Long term (current) use of aspirin: Secondary | ICD-10-CM | POA: Diagnosis not present

## 2017-02-04 DIAGNOSIS — D649 Anemia, unspecified: Secondary | ICD-10-CM | POA: Diagnosis not present

## 2017-02-04 DIAGNOSIS — I700 Atherosclerosis of aorta: Secondary | ICD-10-CM | POA: Diagnosis not present

## 2017-02-04 DIAGNOSIS — Z818 Family history of other mental and behavioral disorders: Secondary | ICD-10-CM

## 2017-02-04 DIAGNOSIS — I5032 Chronic diastolic (congestive) heart failure: Secondary | ICD-10-CM | POA: Diagnosis not present

## 2017-02-04 DIAGNOSIS — I10 Essential (primary) hypertension: Secondary | ICD-10-CM | POA: Diagnosis not present

## 2017-02-04 DIAGNOSIS — F329 Major depressive disorder, single episode, unspecified: Secondary | ICD-10-CM | POA: Diagnosis not present

## 2017-02-04 DIAGNOSIS — E785 Hyperlipidemia, unspecified: Secondary | ICD-10-CM | POA: Diagnosis not present

## 2017-02-04 DIAGNOSIS — E559 Vitamin D deficiency, unspecified: Secondary | ICD-10-CM | POA: Diagnosis not present

## 2017-02-04 DIAGNOSIS — N390 Urinary tract infection, site not specified: Secondary | ICD-10-CM | POA: Diagnosis not present

## 2017-02-04 DIAGNOSIS — Z811 Family history of alcohol abuse and dependence: Secondary | ICD-10-CM | POA: Diagnosis not present

## 2017-02-04 DIAGNOSIS — G4733 Obstructive sleep apnea (adult) (pediatric): Secondary | ICD-10-CM | POA: Diagnosis not present

## 2017-02-04 DIAGNOSIS — F332 Major depressive disorder, recurrent severe without psychotic features: Secondary | ICD-10-CM | POA: Diagnosis present

## 2017-02-04 DIAGNOSIS — F339 Major depressive disorder, recurrent, unspecified: Secondary | ICD-10-CM | POA: Diagnosis not present

## 2017-02-04 DIAGNOSIS — R319 Hematuria, unspecified: Secondary | ICD-10-CM | POA: Diagnosis not present

## 2017-02-04 DIAGNOSIS — M545 Low back pain: Secondary | ICD-10-CM | POA: Diagnosis not present

## 2017-02-04 DIAGNOSIS — E119 Type 2 diabetes mellitus without complications: Secondary | ICD-10-CM | POA: Diagnosis not present

## 2017-02-04 DIAGNOSIS — R45851 Suicidal ideations: Secondary | ICD-10-CM | POA: Diagnosis not present

## 2017-02-04 DIAGNOSIS — M1990 Unspecified osteoarthritis, unspecified site: Secondary | ICD-10-CM | POA: Diagnosis not present

## 2017-02-04 DIAGNOSIS — I110 Hypertensive heart disease with heart failure: Secondary | ICD-10-CM | POA: Diagnosis not present

## 2017-02-04 DIAGNOSIS — E039 Hypothyroidism, unspecified: Secondary | ICD-10-CM | POA: Diagnosis not present

## 2017-02-04 DIAGNOSIS — Z96653 Presence of artificial knee joint, bilateral: Secondary | ICD-10-CM | POA: Diagnosis not present

## 2017-02-04 DIAGNOSIS — E876 Hypokalemia: Secondary | ICD-10-CM | POA: Diagnosis not present

## 2017-02-04 MED ORDER — IBUPROFEN 200 MG PO TABS
400.0000 mg | ORAL_TABLET | Freq: Once | ORAL | Status: AC
  Administered 2017-02-04: 400 mg via ORAL
  Filled 2017-02-04: qty 2

## 2017-02-04 MED ORDER — BUPROPION HCL ER (XL) 150 MG PO TB24
150.0000 mg | ORAL_TABLET | Freq: Every day | ORAL | Status: DC
  Administered 2017-02-04: 150 mg via ORAL
  Filled 2017-02-04: qty 1

## 2017-02-04 MED ORDER — LORAZEPAM 1 MG PO TABS
1.0000 mg | ORAL_TABLET | Freq: Two times a day (BID) | ORAL | Status: DC
  Administered 2017-02-04: 1 mg via ORAL
  Filled 2017-02-04: qty 1

## 2017-02-04 MED ORDER — TRAZODONE HCL 50 MG PO TABS: 50.0000 mg | ORAL_TABLET | Freq: Every day | ORAL | Status: DC

## 2017-02-04 NOTE — BH Assessment (Signed)
[facility] Assessment Progress Note  Per Ebony Cargo, MD, this pt requires psychiatric hospitalization at this time.  At 13:28 Shirlee Limerick calls from [facility].  Pt has been accepted to their facility by Dr Geanie Kenning.  EDP Dorie Rank, MD, concurs with this disposition, as does the pt who is currently under voluntary status.  Pt's nurse, Magda Paganini, has been notified, and agrees to call report to [phone].  Pt is to be transported via Stacey Drain, [facility] [phone]

## 2017-02-04 NOTE — Consult Note (Signed)
[facility] Face-to-Face Psychiatry Consult   Reason for Consult:  Psychiatric evaluation Referring Physician:  EDP Patient Identification: [name] MRN:  [id number] Principal Diagnosis: Major depressive disorder, recurrent episode with anxious distress [facility]) Diagnosis:   Patient Active Problem List   Diagnosis Date Noted  . Major depressive disorder, recurrent episode with anxious distress ([place]) [F33.9] 02/04/2017    Priority: High  . Dementia arising in the senium and presenium [F03.90] 06/27/2016  . Obesity, Class II, BMI 35-39.9, with comorbidity [E66.9] 05/15/2016  . Insomnia w/ sleep apnea [G47.00, G47.30] 10/16/2015  . Other constipation [K59.09] 03/16/2015  . IBS (irritable bowel syndrome) [K58.9] 02/27/2015  . Lumbar spondylosis [M47.816] 02/10/2015  . Lumbar radiculopathy [M54.16] 02/10/2015  . Osteoarthritis of right knee [M17.11] 02/10/2015  . Hashimoto's thyroiditis [E06.3] 05/05/2014  . Hyperglycemia [R73.9] 02/21/2014  . Adrenal incidentaloma ([place]) [E27.8] 11/04/2013  . Benign paroxysmal positional vertigo [H81.10] 07/15/2013  . Aortic stenosis, mild [I35.0] 07/14/2013  . OAB (overactive bladder) [N32.81] 02/24/2013  . GERD with stricture [K21.9, K22.2] 12/24/2012  . Sleep-related hypoventilation [G47.36] 07/30/2012  . OSA (obstructive sleep apnea) [G47.33] 04/16/2012  . Osteopenia [M85.80] 04/16/2012  . Preventative health care [Z00.00] 05/24/2011  . VITAMIN D DEFICIENCY [E55.9] 12/04/2010  . Hyperlipidemia with target LDL less than 130 [E78.5] 12/03/2010  . GAD (generalized anxiety disorder) [F41.1] 12/03/2010  . DEPRESSION [F32.9] 12/03/2010  . Essential hypertension [I10] 12/03/2010    Total Time spent with patient: 45 minutes  Subjective:   [name] is a 83 y.o. female patient admitted with depression and suicidal thoughts.  HPI:  Patient with history of Depression, Anxiety, chronic pain and multiple medical problems who was brought to the ED by her  family due to depression and passive suicidal thoughts. Patient reports that she asked her doctor to wean her off pain medication last month but patient states she has been feeling depressed, anxious, nervous, fidgety since then. Patient reports low energy level, lack of motivation and feeling hopeless due to multiple medical issue. Patient denies drugs and alcohol abuse.  Past Psychiatric History: as above  Risk to Self: Suicidal Ideation: No Suicidal Intent: No Is patient at risk for suicide?: No Suicidal Plan?: No Access to Means: No What has been your use of drugs/alcohol within the last 12 months?: None How many times?: 0 Other Self Harm Risks: None Triggers for Past Attempts: None known Intentional Self Injurious Behavior: None Risk to Others: Homicidal Ideation: No Thoughts of Harm to Others: No Current Homicidal Intent: No Current Homicidal Plan: No Access to Homicidal Means: No Identified Victim: No one History of harm to others?: No Assessment of Violence: None Noted Violent Behavior Description: None reported Does patient have access to weapons?: Yes (Comment) (Pt says there are several of them (guns) in the home.) Criminal Charges Pending?: No Does patient have a court date: No Prior Inpatient Therapy: Prior Inpatient Therapy: No Prior Therapy Dates: N/A Prior Therapy Facilty/Provider(s): N/A Reason for Treatment: N/A Prior Outpatient Therapy: Prior Outpatient Therapy: No Prior Therapy Dates: N/A Prior Therapy Facilty/Provider(s): N/A Reason for Treatment: N/A Does patient have an ACCT team?: No Does patient have Intensive In-House Services?  : No Does patient have [facility] services? : No Does patient have P4CC services?: No  Past Medical History:  Past Medical History:  Diagnosis Date  . Abdominal pain   . Anemia    NOS  . Anxiety   . Back pain   . Bruises easily   . Chronic diastolic CHF (congestive  heart failure) ([place])    a. 06/2013 EF 65-70%.  . Chronic  headaches    HISTORY OF   . Depression   . Diabetes mellitus    type II  DIET CONTROLLED  . Diarrhea   . Hepatitis    AGE 30S  . Hyperlipidemia   . Hypertension   . Mild aortic stenosis    a. 06/2013 Echo: EF 65-70%, mod LVH with focal basal hypertrophy, very mild AS.  Marland Kitchen Osteoarthritis   . Oxygen desaturation during sleep    USES 2 LITERS BEDTIME   VIA CPAP 06/2012 [facility]   . Shortness of breath    WITH EXERTION USES 2 L O2 BEDTIME  . Sleep apnea    CPAP WITH O2 2 LITERS 2013 ([facility])  . Wears dentures   . Wears glasses     Past Surgical History:  Procedure Laterality Date  . ABDOMINAL HYSTERECTOMY    . APPENDECTOMY    . CHOLECYSTECTOMY    . JOINT REPLACEMENT     LEFT KNEE   . KNEE ARTHROPLASTY Right 03/22/2015   Procedure: COMPUTER ASSISTED TOTAL KNEE ARTHROPLASTY;  Surgeon: Marybelle Killings, MD;  Location: [place];  Service: Orthopedics;  Laterality: Right;  . KNEE ARTHROSCOPY     RIGHT  . LEFT AND RIGHT HEART CATHETERIZATION WITH CORONARY ANGIOGRAM N/A 10/28/2013   Procedure: LEFT AND RIGHT HEART CATHETERIZATION WITH CORONARY ANGIOGRAM;  Surgeon: Blane Ohara, MD;  Location: [facility] CATH LAB;  Service: Cardiovascular;  Laterality: N/A;  . LEFT HEART CATHETERIZATION WITH CORONARY ANGIOGRAM N/A 11/14/2014   Procedure: LEFT HEART CATHETERIZATION WITH CORONARY ANGIOGRAM;  Surgeon: Josue Hector, MD;  Location: [facility] CATH LAB;  Service: Cardiovascular;  Laterality: N/A;  . REVISION TOTAL KNEE ARTHROPLASTY  2011  . ROTATOR CUFF REPAIR  2010  . SHOULDER ARTHROSCOPY  09/17/2012   Procedure: ARTHROSCOPY SHOULDER;  Surgeon: Sharmon Revere, MD;  Location: [place];  Service: Orthopedics;  Laterality: Left;  . TONSILLECTOMY    . TONSILLECTOMY     Family History:  Family History  Problem Relation Age of Onset  . Cancer Father     prostate cancer  . Heart disease Sister   . Heart disease Brother   . Alcohol abuse Other   . Hypertension Other   . Kidney disease Other   . Mental  illness Other   . Emphysema Brother    Family Psychiatric  History:  Social History:  History  Alcohol Use No     History  Drug Use No    Social History   Social History  . Marital status: Widowed    Spouse name: N/A  . Number of children: 5  . Years of education: N/A   Occupational History  . 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 Smoking status: Never Smoker  . Smokeless tobacco: Never Used  . Alcohol use No  . Drug use: No  . Sexual activity: No   Other Topics Concern  . None   Social History Narrative  . None   Additional Social History:    Allergies:   Allergies  Allergen Reactions  . Amlodipine Other (See Comments)    Reaction:  Dizziness   . Enalapril Cough  . Losartan Potassium Other (See Comments)    Reaction:  GI pain   . Morphine And Related Itching and Nausea And Vomiting  . Metformin Diarrhea    Labs:  Results  for orders placed or performed during the hospital encounter of 02/03/17 (from the past 48 hour(s))  Urinalysis, Routine w reflex microscopic     Status: Abnormal   Collection Time: 02/03/17  5:25 PM  Result Value Ref Range   Color, Urine YELLOW YELLOW   APPearance CLEAR CLEAR   Specific Gravity, Urine 1.011 1.005 - 1.030   pH 5.0 5.0 - 8.0   Glucose, UA NEGATIVE NEGATIVE mg/dL   Hgb urine dipstick MODERATE (A) NEGATIVE   Bilirubin Urine NEGATIVE NEGATIVE   Ketones, ur NEGATIVE NEGATIVE mg/dL   Protein, ur NEGATIVE NEGATIVE mg/dL   Nitrite NEGATIVE NEGATIVE   Leukocytes, UA LARGE (A) NEGATIVE   RBC / HPF 6-30 0 - 5 RBC/hpf   WBC, UA TOO NUMEROUS TO COUNT 0 - 5 WBC/hpf   Bacteria, UA MANY (A) NONE SEEN   Squamous Epithelial / LPF 0-5 (A) NONE SEEN   Mucous PRESENT   CBG monitoring, ED     Status: None   Collection Time: 02/03/17  5:31 PM  Result Value Ref Range   Glucose-Capillary 97 65 - 99 mg/dL  .Cooxemetry Panel (carboxy, met, total hgb, O2 sat)     Status: None   Collection Time:  02/03/17  5:52 PM  Result Value Ref Range   Total hemoglobin 14.3 12.0 - 16.0 g/dL   O2 Saturation 19.0 %   Carboxyhemoglobin 1.3 0.5 - 1.5 %   Methemoglobin 0.8 0.0 - 1.5 %  Comprehensive metabolic panel     Status: Abnormal   Collection Time: 02/03/17  5:55 PM  Result Value Ref Range   Sodium 141 135 - 145 mmol/L   Potassium 3.1 (L) 3.5 - 5.1 mmol/L   Chloride 108 101 - 111 mmol/L   CO2 25 22 - 32 mmol/L   Glucose, Bld 100 (H) 65 - 99 mg/dL   BUN 10 6 - 20 mg/dL   Creatinine, Ser 3.47 0.44 - 1.00 mg/dL   Calcium 9.6 8.9 - 45.1 mg/dL   Total Protein 7.1 6.5 - 8.1 g/dL   Albumin 4.0 3.5 - 5.0 g/dL   AST 23 15 - 41 U/L   ALT 24 14 - 54 U/L   Alkaline Phosphatase 41 38 - 126 U/L   Total Bilirubin 1.0 0.3 - 1.2 mg/dL   GFR calc non Af Amer >60 >60 mL/min   GFR calc Af Amer >60 >60 mL/min    Comment: (NOTE) The eGFR has been calculated using the CKD EPI equation. This calculation has not been validated in all clinical situations. eGFR's persistently <60 mL/min signify possible Chronic Kidney Disease.    Anion gap 8 5 - 15  CBC     Status: Abnormal   Collection Time: 02/03/17  5:55 PM  Result Value Ref Range   WBC 10.7 (H) 4.0 - 10.5 K/uL   RBC 4.38 3.87 - 5.11 MIL/uL   Hemoglobin 13.2 12.0 - 15.0 g/dL   HCT 21.9 21.7 - 42.0 %   MCV 91.8 78.0 - 100.0 fL   MCH 30.1 26.0 - 34.0 pg   MCHC 32.8 30.0 - 36.0 g/dL   RDW 66.1 51.1 - 10.4 %   Platelets 154 150 - 400 K/uL  TSH     Status: None   Collection Time: 02/03/17  5:55 PM  Result Value Ref Range   TSH 2.695 0.350 - 4.500 uIU/mL    Comment: Performed by a 3rd Generation assay with a functional sensitivity of <=0.01 uIU/mL.    Current Facility-Administered  Medications  Medication Dose Route Frequency Provider Last Rate Last Dose  . aspirin EC tablet 325 mg  325 mg Oral Daily Leo Grosser, MD      . buPROPion (WELLBUTRIN XL) 24 hr tablet 150 mg  150 mg Oral Daily Psalms Olarte, MD      . carvedilol (COREG) tablet 6.25  mg  6.25 mg Oral BID WC Leo Grosser, MD   6.25 mg at 02/03/17 2103  . cephALEXin (KEFLEX) capsule 500 mg  500 mg Oral Q8H Leo Grosser, MD   500 mg at 02/04/17 0641  . chlorthalidone (HYGROTON) tablet 25 mg  25 mg Oral Daily Leo Grosser, MD   25 mg at 02/03/17 2103  . hydrALAZINE (APRESOLINE) tablet 50 mg  50 mg Oral TID Leo Grosser, MD   50 mg at 02/03/17 2103  . isosorbide mononitrate (ISMO,MONOKET) tablet 20 mg  20 mg Oral BID Leo Grosser, MD   20 mg at 02/03/17 2110  . LORazepam (ATIVAN) tablet 1 mg  1 mg Oral BID Graycie Halley, MD      . pantoprazole (PROTONIX) EC tablet 40 mg  40 mg Oral Daily Leo Grosser, MD   40 mg at 02/03/17 2107  . traZODone (DESYREL) tablet 50 mg  50 mg Oral QHS Corena Pilgrim, MD       Current Outpatient Prescriptions  Medication Sig Dispense Refill  . aspirin EC 325 MG tablet Take 325 mg by mouth daily.    . calcium carbonate (OS-CAL) 600 MG TABS tablet Take 600 mg by mouth 2 (two) times daily with a meal.    . carvedilol (COREG) 6.25 MG tablet Take 1 tablet (6.25 mg total) by mouth 2 (two) times daily with a meal. 60 tablet 11  . chlorthalidone (HYGROTON) 25 MG tablet Take 1 tablet (25 mg total) by mouth daily. 30 tablet 11  . cholecalciferol (VITAMIN D) 1000 units tablet Take 2,000 Units by mouth daily.    . hydrALAZINE (APRESOLINE) 50 MG tablet Take 1 tablet (50 mg total) by mouth 3 (three) times daily. 270 tablet 3  . isosorbide mononitrate (ISMO,MONOKET) 20 MG tablet Take 1 tablet (20 mg total) by mouth 2 (two) times daily at 10 AM and 5 PM. 180 tablet 3  . MYRBETRIQ 25 MG TB24 tablet Take 25 mg by mouth at bedtime as needed (overactive bladder).     Marland Kitchen omeprazole (PRILOSEC) 40 MG capsule Take 1 capsule (40 mg total) by mouth daily. (Patient taking differently: Take 40 mg by mouth daily as needed (heartburn). ) 90 capsule 3  . oxyCODONE-acetaminophen (PERCOCET) 10-325 MG tablet Take 1 tablet by mouth every 8 (eight) hours as needed for pain. 90 tablet 0   . traZODone (DESYREL) 100 MG tablet Take 2 tablets (200 mg total) by mouth at bedtime. (Patient taking differently: Take 100 mg by mouth at bedtime as needed for sleep. ) 180 tablet 3  . clonazePAM (KLONOPIN) 1 MG tablet Take 1 tablet (1 mg total) by mouth 2 (two) times daily. 60 tablet 3  . LORazepam (ATIVAN) 1 MG tablet Take 1 mg by mouth every 8 (eight) hours as needed for anxiety.    . meclizine (ANTIVERT) 25 MG tablet Take 25 mg by mouth 3 (three) times daily as needed for dizziness.      Musculoskeletal: Strength & Muscle Tone: within normal limits Gait & Station: unsteady Patient leans: N/A  Psychiatric Specialty Exam: Physical Exam  Psychiatric: Judgment normal. Her mood appears anxious. Her speech is delayed. She  is slowed and withdrawn. Cognition and memory are normal. She exhibits a depressed mood. She expresses suicidal ideation.    Review of Systems  Constitutional: Positive for malaise/fatigue.    Blood pressure 127/66, pulse 62, temperature 97.9 F (36.6 C), temperature source Oral, resp. rate 18, height '5\' 2"'$  (1.575 m), weight 87.1 kg (192 lb), SpO2 94 %.Body mass index is 35.12 kg/m.  General Appearance: Casual  Eye Contact:  Minimal  Speech:  Slow  Volume:  Decreased  Mood:  Anxious, Depressed and Dysphoric  Affect:  Constricted  Thought Process:  Coherent  Orientation:  Full (Time, Place, and Person)  Thought Content:  Logical  Suicidal Thoughts:  Yes.  without intent/plan  Homicidal Thoughts:  No  Memory:  Immediate;   Fair Recent;   Fair Remote;   Good  Judgement:  Poor  Insight:  Present  Psychomotor Activity:  Decreased and Psychomotor Retardation  Concentration:  Concentration: Fair and Attention Span: Fair  Recall:  [employer] of Knowledge:  Fair  Language:  Good  Akathisia:  No  Handed:  Right  AIMS (if indicated):     Assets:  Communication Skills Desire for Improvement Social Support  ADL's:  Intact  Cognition:  WNL  Sleep:   poor      Treatment Plan Summary: Daily contact with patient to assess and evaluate symptoms and progress in treatment and Medication management  Start Wellbutrin XL 150 mg daily for depression. Start Lorazepam 1 mg bid for anxiety Start Trazodone 50 mg qhs for insomnia C X ray Urinalysis Ekg  Disposition: Recommend psychiatric Inpatient admission when medically cleared.                       Geropsych inpatient admission for stabilization.  Corena Pilgrim, MD 02/04/2017 10:25 AM

## 2017-02-04 NOTE — ED Notes (Signed)
[facility] arrived to receive patient

## 2017-02-04 NOTE — BH Assessment (Addendum)
[facility] Assessment Progress Note  After speaking to [name] at [facility], this writer spoke to this patient.  She wants to be hospitalized at this time, and agrees to consent for admission to a facility.  She is alert and oriented, and her thought processes are linear and goal directed.  She reports that she is her own guardian.  She notes that she does not have a power of attorney, but adds that she needs to arrange for one.  She wants her children informed of her disposition, and has signed Consent to Release Information to her daughter and her son.  Jalene Mullet, MA Triage Specialist [phone]  Addendum:  Patient has signed Request for Voluntary Admission for [facility], and understands the terms of admission.  Jalene Mullet, [place] Triage Specialist [phone]

## 2017-02-04 NOTE — ED Notes (Signed)
Report given to Shirlee Limerick, RN and contacted [facility] transportation.

## 2017-02-04 NOTE — ED Notes (Signed)
Report given to Grace,RN.

## 2017-02-04 NOTE — Progress Notes (Signed)
Pt's heart rate consistently in the 50's.  Last ED visit 3/8 it was 80's and 90's.  She has been taking some of her medication incorrectly at home and running out early.  She does take Coreg. Pt may have taken than ordered prior to admission. Will notify next RN to observe. Royer Cristobal P Myasia Sinatra

## 2017-02-04 NOTE — ED Notes (Signed)
MD at bedside. 

## 2017-02-04 NOTE — ED Provider Notes (Signed)
Vitals:   02/04/17 0914 02/04/17 1105  BP: 127/66 185/75  Pulse: 62   Resp: 18   Temp: 97.9 F (36.6 C)    Pt has been accepted for transfer.  HTN noted.  Otherwise medically stable   Dorie Rank, MD 02/04/17 [phone]

## 2017-02-04 NOTE — ED Notes (Signed)
Family at bedside. Tom requested to hold off on transferring patient to [facility], waiting for son to bring clothes.

## 2017-02-05 LAB — URINE CULTURE

## 2017-02-10 ENCOUNTER — Ambulatory Visit (INDEPENDENT_AMBULATORY_CARE_PROVIDER_SITE_OTHER): Payer: Medicare HMO | Admitting: Internal Medicine

## 2017-02-10 ENCOUNTER — Other Ambulatory Visit: Payer: Self-pay | Admitting: Internal Medicine

## 2017-02-10 ENCOUNTER — Encounter: Payer: Self-pay | Admitting: Internal Medicine

## 2017-02-10 VITALS — BP 138/74 | HR 67 | Temp 97.4°F | Resp 16 | Ht 62.0 in | Wt 194.2 lb

## 2017-02-10 DIAGNOSIS — I10 Essential (primary) hypertension: Secondary | ICD-10-CM

## 2017-02-10 DIAGNOSIS — F339 Major depressive disorder, recurrent, unspecified: Secondary | ICD-10-CM

## 2017-02-10 DIAGNOSIS — N3281 Overactive bladder: Secondary | ICD-10-CM | POA: Diagnosis not present

## 2017-02-10 NOTE — Progress Notes (Signed)
Subjective:  Patient ID: [name], female    DOB: December 30, 1932  Age: 83 y.o. MRN: [id number]  CC: Hypertension and Depression   HPI Lashika LENNIS RADER presents for f/up - she was admitted over the last week to an outside psychiatric facility because her son thought that she was suicidal. She returns today telling me that she feels well and is not suicidal. She has had her medications adjusted and is doing well on oxycodone for pain as well as a benzodiazepine for anxiety. She complains that her Myrbetriq Is too expensive to treat her OAB and she wants to try a generic option. She was concerned that she may be developing hypothyroidism but her recent TSH was in the normal range apparently on no thyroid replacement therapy.  Outpatient Medications Prior to Visit  Medication Sig Dispense Refill  . aspirin EC 325 MG tablet Take 325 mg by mouth daily.    . calcium carbonate (OS-CAL) 600 MG TABS tablet Take 600 mg by mouth 2 (two) times daily with a meal.    . carvedilol (COREG) 6.25 MG tablet Take 1 tablet (6.25 mg total) by mouth 2 (two) times daily with a meal. 60 tablet 11  . chlorthalidone (HYGROTON) 25 MG tablet Take 1 tablet (25 mg total) by mouth daily. 30 tablet 11  . cholecalciferol (VITAMIN D) 1000 units tablet Take 2,000 Units by mouth daily.    . clonazePAM (KLONOPIN) 1 MG tablet Take 1 tablet (1 mg total) by mouth 2 (two) times daily. 60 tablet 3  . hydrALAZINE (APRESOLINE) 50 MG tablet Take 1 tablet (50 mg total) by mouth 3 (three) times daily. 270 tablet 3  . isosorbide mononitrate (ISMO,MONOKET) 20 MG tablet Take 1 tablet (20 mg total) by mouth 2 (two) times daily at 10 AM and 5 PM. 180 tablet 3  . meclizine (ANTIVERT) 25 MG tablet Take 25 mg by mouth 3 (three) times daily as needed for dizziness.    Marland Kitchen omeprazole (PRILOSEC) 40 MG capsule Take 1 capsule (40 mg total) by mouth daily. (Patient taking differently: Take 40 mg by mouth daily as needed (heartburn). ) 90 capsule 3  .  oxyCODONE-acetaminophen (PERCOCET) 10-325 MG tablet Take 1 tablet by mouth every 8 (eight) hours as needed for pain. 90 tablet 0  . traZODone (DESYREL) 100 MG tablet Take 2 tablets (200 mg total) by mouth at bedtime. (Patient taking differently: Take 100 mg by mouth at bedtime as needed for sleep. ) 180 tablet 3  . LORazepam (ATIVAN) 1 MG tablet Take 1 mg by mouth every 8 (eight) hours as needed for anxiety.    Marland Kitchen MYRBETRIQ 25 MG TB24 tablet Take 25 mg by mouth at bedtime as needed (overactive bladder).      No facility-administered medications prior to visit.     ROS Review of Systems  Constitutional: Negative for appetite change, chills, diaphoresis, fatigue and unexpected weight change.  HENT: Negative.  Negative for trouble swallowing.   Eyes: Negative for visual disturbance.  Respiratory: Negative for cough, chest tightness, shortness of breath and stridor.   Cardiovascular: Negative for chest pain, palpitations and leg swelling.  Gastrointestinal: Negative for abdominal pain, constipation, diarrhea, nausea and vomiting.  Endocrine: Positive for polyuria. Negative for cold intolerance, heat intolerance, polydipsia and polyphagia.  Genitourinary: Positive for frequency. Negative for decreased urine volume, dysuria and urgency.  Musculoskeletal: Positive for arthralgias. Negative for back pain, myalgias and neck pain.  Skin: Negative.  Negative for color change and rash.  Allergic/Immunologic: Negative.   Neurological: Negative.  Negative for dizziness.  Hematological: Negative.  Negative for adenopathy. Does not bruise/bleed easily.  Psychiatric/Behavioral: Positive for sleep disturbance. Negative for behavioral problems, decreased concentration, dysphoric mood, self-injury and suicidal ideas. The patient is nervous/anxious.     Objective:  BP 138/74 (BP Location: Left Arm, Patient Position: Sitting, Cuff Size: Normal)   Pulse 67   Temp 97.4 F (36.3 C) (Oral)   Resp 16   Ht 5\' 2"   (1.575 m)   Wt 194 lb 4 oz (88.1 kg)   SpO2 95%   BMI 35.53 kg/m   BP Readings from Last 3 Encounters:  02/10/17 138/74  02/04/17 182/89  02/03/17 (!) 170/98    Wt Readings from Last 3 Encounters:  02/10/17 194 lb 4 oz (88.1 kg)  02/03/17 192 lb (87.1 kg)  02/03/17 192 lb 12 oz (87.4 kg)    Physical Exam  Constitutional: She is oriented to person, place, and time. No distress.  HENT:  Mouth/Throat: Oropharynx is clear and moist. No oropharyngeal exudate.  Eyes: Conjunctivae are normal. Right eye exhibits no discharge. Left eye exhibits no discharge. No scleral icterus.  Neck: Normal range of motion. Neck supple. No JVD present. No tracheal deviation present. No thyromegaly present.  Cardiovascular: Normal rate, regular rhythm, normal heart sounds and intact distal pulses.  Exam reveals no gallop and no friction rub.   No murmur heard. Pulmonary/Chest: Effort normal and breath sounds normal. No stridor. No respiratory distress. She has no wheezes. She has no rales. She exhibits no tenderness.  Abdominal: Soft. Bowel sounds are normal. She exhibits no distension and no mass. There is no tenderness. There is no rebound and no guarding.  Musculoskeletal: Normal range of motion. She exhibits no edema, tenderness or deformity.  Lymphadenopathy:    She has no cervical adenopathy.  Neurological: She is oriented to person, place, and time.  Skin: Skin is warm and dry. No rash noted. She is not diaphoretic. No erythema. No pallor.  Psychiatric: She has a normal mood and affect. Her behavior is normal. Judgment and thought content normal. Her mood appears not anxious. Her affect is not angry. Her speech is not rapid and/or pressured, not delayed and not tangential. She is not slowed and not withdrawn. Cognition and memory are normal. She does not exhibit a depressed mood. She expresses no homicidal and no suicidal ideation. She expresses no suicidal plans and no homicidal plans. She is  attentive.    Lab Results  Component Value Date   WBC 10.7 (H) 02/03/2017   HGB 13.2 02/03/2017   HCT 40.2 02/03/2017   PLT 154 02/03/2017   GLUCOSE 100 (H) 02/03/2017   CHOL 222 (H) 06/11/2016   TRIG 279.0 (H) 06/11/2016   HDL 54.20 06/11/2016   LDLDIRECT 114.0 06/11/2016   LDLCALC 106 (H) 05/16/2015   ALT 24 02/03/2017   AST 23 02/03/2017   NA 141 02/03/2017   K 3.1 (L) 02/03/2017   CL 108 02/03/2017   CREATININE 0.56 02/03/2017   BUN 10 02/03/2017   CO2 25 02/03/2017   TSH 2.695 02/03/2017   INR 1.01 03/10/2015   HGBA1C 5.0 05/16/2015    Dg Chest 2 View  Result Date: 02/04/2017 CLINICAL DATA:  Health maintenance with planned admission to behavioral health institution. Hypertension. EXAM: CHEST  2 VIEW COMPARISON:  March 10, 2015 FINDINGS: The lungs are clear. The heart size and pulmonary vascularity are normal. No adenopathy. There is atherosclerotic calcification in the aorta.  There is degenerative change in the thoracic spine. IMPRESSION: No edema or consolidation.  Aortic atherosclerosis. Electronically Signed   By: Lowella Grip III M.D.   On: 02/04/2017 11:48    Assessment & Plan:   Chidinma was seen today for hypertension and depression.  Diagnoses and all orders for this visit:  Essential hypertension- her blood pressure is adequately well controlled  Major depressive disorder, recurrent episode with anxious distress ([place])- the treatment facility asked that she be referred to a psychiatrist so I have ordered that referral, at this time she is not willing to take an antidepressant.  OAB (overactive bladder) -     tolterodine (DETROL LA) 2 MG 24 hr capsule; Take 1 capsule (2 mg total) by mouth daily.   I have discontinued Ms. Radick's MYRBETRIQ and LORazepam. I am also having her start on tolterodine. Additionally, I am having her maintain her calcium carbonate, carvedilol, cholecalciferol, aspirin EC, chlorthalidone, hydrALAZINE, traZODone, isosorbide  mononitrate, omeprazole, clonazePAM, oxyCODONE-acetaminophen, and meclizine.  Meds ordered this encounter  Medications  . tolterodine (DETROL LA) 2 MG 24 hr capsule    Sig: Take 1 capsule (2 mg total) by mouth daily.    Dispense:  90 capsule    Refill:  1     Follow-up: Return in about 4 months (around 06/12/2017).  Scarlette Calico, MD

## 2017-02-10 NOTE — Progress Notes (Signed)
Pre visit review using our clinic review tool, if applicable. No additional management support is needed unless otherwise documented below in the visit note. 

## 2017-02-10 NOTE — Patient Instructions (Signed)

## 2017-02-11 ENCOUNTER — Telehealth: Payer: Self-pay | Admitting: Internal Medicine

## 2017-02-11 MED ORDER — TOLTERODINE TARTRATE ER 2 MG PO CP24: 2.0000 mg | ORAL_CAPSULE | Freq: Every day | ORAL | 1 refills | Status: DC

## 2017-02-11 NOTE — Telephone Encounter (Signed)
Pt is calling in stating that when she saw Dr. Ronnald Ramp yesterday he advised he was calling in a Rx for her bladder and for her inactive thyroid.  Both prescriptions should go to [facility] on [address].

## 2017-02-13 ENCOUNTER — Ambulatory Visit: Payer: Medicare HMO | Admitting: Internal Medicine

## 2017-02-13 NOTE — Telephone Encounter (Signed)
Contacted pt and informed that the Detrol LA was sent on Tuesday.   Pt stated that the pharmacy called her and informed her of the same.

## 2017-02-24 ENCOUNTER — Ambulatory Visit: Payer: Medicare HMO | Admitting: Internal Medicine

## 2017-02-27 ENCOUNTER — Telehealth: Payer: Self-pay | Admitting: Internal Medicine

## 2017-02-27 ENCOUNTER — Other Ambulatory Visit: Payer: Self-pay | Admitting: Internal Medicine

## 2017-02-27 NOTE — Telephone Encounter (Signed)
Patient states she can not take percocet.  Patient would like to know if there was a pain medication she could take that is time released.

## 2017-03-18 DIAGNOSIS — H40013 Open angle with borderline findings, low risk, bilateral: Secondary | ICD-10-CM | POA: Diagnosis not present

## 2017-04-17 ENCOUNTER — Encounter (HOSPITAL_COMMUNITY): Payer: Self-pay | Admitting: Psychiatry

## 2017-04-17 ENCOUNTER — Ambulatory Visit (INDEPENDENT_AMBULATORY_CARE_PROVIDER_SITE_OTHER): Payer: Medicare HMO | Admitting: Psychiatry

## 2017-04-17 VITALS — BP 140/82 | HR 67 | Ht 64.0 in | Wt 199.0 lb

## 2017-04-17 DIAGNOSIS — F4322 Adjustment disorder with anxiety: Secondary | ICD-10-CM | POA: Diagnosis not present

## 2017-04-17 DIAGNOSIS — Z818 Family history of other mental and behavioral disorders: Secondary | ICD-10-CM

## 2017-04-17 DIAGNOSIS — Z811 Family history of alcohol abuse and dependence: Secondary | ICD-10-CM | POA: Diagnosis not present

## 2017-04-17 MED ORDER — TEMAZEPAM 15 MG PO CAPS: 15.0000 mg | ORAL_CAPSULE | Freq: Every evening | ORAL | 2 refills | Status: DC | PRN

## 2017-04-17 NOTE — Progress Notes (Signed)
Psychiatric Initial Adult Assessment   Patient Identification: [name] MRN:  [id number] Date of Evaluation:  04/17/2017 Referral Source: Dr. Scarlette Calico Chief Complaint:   Visit Diagnosis: No diagnosis found.  History of Present Illness:    This patient is an 83 year old white female who is a widow who lives alone and is being seen here after hospitalization at [facility] for 3 days. Apparently one occurred as she lost control with her Percocet. She went from taking it twice a day to getting up to 7 or 8 pills daily. Sometimes she made attempts to try to come off of it but is very comfortable in their son's story that she became suicidal. Her son will leave her to [facility] long hospital and she was admitted to [facility] for a three-day detoxification.. At this time the patient is doing better. She continues to have chronic mild pain. The patient is a widow as of 2009. She has 5 children and 6 grandchildren and 20 great-grandchildren. She's been retired for many years of [place] at [employer].today the patient denies being depressed. She sleeping and eating fairly well. She says her sleep is not great but she takes trazodone every night 200 mg. Her energy level is good. She denies any problems thinking or concentrating. She is not suicidal now and never really has been. She's never made a suicide. She enjoys gardening reading and going to the [facility] to exercise. The patient denies the use of any alcohol. She denies any psychotic symptoms.. She denies symptoms consistent with a past episode of major depression or mania. The patient denies symptoms consistent with generalized anxiety disorder, panic disorder or obsessive-compulsive disorder. At this time it is not clear why she is on any benzodiazepine. She is chronic back pain, hypertension and has had multiple knee replacements. The patient previously had not been in a psychiatric hospital never been seen by a psychiatrist .the patient says that she  needs her Klonopin for her anxiety. She also says that she's back to taking Percocet twice a day. This patient lives alone. Today we may contact with her primary care doctor, Dr. Scarlette Calico who confirmed much with this patient. At this time the patient is stable.  Associated Signs/Symptoms: Depression Symptoms:  anxiety, (Hypo) Manic Symptoms:   Anxiety Symptoms:   Psychotic Symptoms:   PTSD Symptoms:   Past Psychiatric History  Previous Psychotropic Medications: Klonopin 1 mg twice a day, trazodone 200 mg   Substance Abuse History in the last 12 months:    Consequences of Substance Abuse:   Past Medical History:  Past Medical History:  Diagnosis Date  . Abdominal pain   . Anemia    NOS  . Anxiety   . Back pain   . Bruises easily   . Chronic diastolic CHF (congestive heart failure) ([place])    a. 06/2013 EF 65-70%.  . Chronic headaches    HISTORY OF   . Depression   . Diabetes mellitus    type II  DIET CONTROLLED  . Diarrhea   . Hepatitis    AGE 30S  . Hyperlipidemia   . Hypertension   . Mild aortic stenosis    a. 06/2013 Echo: EF 65-70%, mod LVH with focal basal hypertrophy, very mild AS.  Marland Kitchen Osteoarthritis   . Oxygen desaturation during sleep    USES 2 LITERS BEDTIME   VIA CPAP 06/2012 [facility]   . Shortness of breath    WITH EXERTION USES 2 L O2 BEDTIME  .  Sleep apnea    CPAP WITH O2 2 LITERS 2013 ([facility])  . Wears dentures   . Wears glasses     Past Surgical History:  Procedure Laterality Date  . ABDOMINAL HYSTERECTOMY    . APPENDECTOMY    . CHOLECYSTECTOMY    . JOINT REPLACEMENT     LEFT KNEE   . KNEE ARTHROPLASTY Right 03/22/2015   Procedure: COMPUTER ASSISTED TOTAL KNEE ARTHROPLASTY;  Surgeon: Marybelle Killings, MD;  Location: [facility];  Service: Orthopedics;  Laterality: Right;  . KNEE ARTHROSCOPY     RIGHT  . LEFT AND RIGHT HEART CATHETERIZATION WITH CORONARY ANGIOGRAM N/A 10/28/2013   Procedure: LEFT AND RIGHT HEART CATHETERIZATION WITH CORONARY ANGIOGRAM;   Surgeon: Blane Ohara, MD;  Location: [facility] CATH LAB;  Service: Cardiovascular;  Laterality: N/A;  . LEFT HEART CATHETERIZATION WITH CORONARY ANGIOGRAM N/A 11/14/2014   Procedure: LEFT HEART CATHETERIZATION WITH CORONARY ANGIOGRAM;  Surgeon: Josue Hector, MD;  Location: [facility] CATH LAB;  Service: Cardiovascular;  Laterality: N/A;  . REVISION TOTAL KNEE ARTHROPLASTY  2011  . ROTATOR CUFF REPAIR  2010  . SHOULDER ARTHROSCOPY  09/17/2012   Procedure: ARTHROSCOPY SHOULDER;  Surgeon: Sharmon Revere, MD;  Location: [facility];  Service: Orthopedics;  Laterality: Left;  . TONSILLECTOMY    . TONSILLECTOMY      Family Psychiatric History:   Family History:  Family History  Problem Relation Age of Onset  . Cancer Father        prostate cancer  . Heart disease Sister   . Heart disease Brother   . Alcohol abuse Other   . Hypertension Other   . Kidney disease Other   . Mental illness Other   . Emphysema Brother     Social History:   Social History   Social History  . Marital status: Widowed    Spouse name: N/A  . Number of children: 5  . Years of education: N/A   Occupational History  . retired  Retired    Museum/gallery curator at [employer]  . Smoking status: Never Smoker  . Smokeless tobacco: Never Used  . Alcohol use No  . Drug use: No  . Sexual activity: No   Other Topics Concern  . None   Social History Narrative  . None    Additional Social History:   Allergies:   Allergies  Allergen Reactions  . Amlodipine Other (See Comments)    Reaction:  Dizziness   . Enalapril Cough  . Losartan Potassium Other (See Comments)    Reaction:  GI pain   . Morphine And Related Itching and Nausea And Vomiting  . Metformin Diarrhea    Metabolic Disorder Labs: Lab Results  Component Value Date   HGBA1C 5.0 05/16/2015   No results found for: PROLACTIN Lab Results  Component Value Date   CHOL 222 (H) 06/11/2016   TRIG 279.0 (H) 06/11/2016   HDL 54.20  06/11/2016   CHOLHDL 4 06/11/2016   VLDL 55.8 (H) 06/11/2016   LDLCALC 106 (H) 05/16/2015   LDLCALC 116 (H) 05/25/2014     Current Medications: Current Outpatient Prescriptions  Medication Sig Dispense Refill  . aspirin EC 325 MG tablet Take 325 mg by mouth daily.    . calcium carbonate (OS-CAL) 600 MG TABS tablet Take 600 mg by mouth 2 (two) times daily with a meal.    . carvedilol (COREG) 6.25 MG tablet Take 1 tablet (6.25 mg total) by mouth 2 (  two) times daily with a meal. 60 tablet 11  . chlorthalidone (HYGROTON) 25 MG tablet Take 1 tablet (25 mg total) by mouth daily. 30 tablet 11  . cholecalciferol (VITAMIN D) 1000 units tablet Take 2,000 Units by mouth daily.    . clonazePAM (KLONOPIN) 1 MG tablet Take 1 tablet (1 mg total) by mouth 2 (two) times daily. 60 tablet 3  . hydrALAZINE (APRESOLINE) 50 MG tablet Take 1 tablet (50 mg total) by mouth 3 (three) times daily. 270 tablet 3  . isosorbide mononitrate (ISMO,MONOKET) 20 MG tablet Take 1 tablet (20 mg total) by mouth 2 (two) times daily at 10 AM and 5 PM. 180 tablet 3  . meclizine (ANTIVERT) 25 MG tablet Take 25 mg by mouth 3 (three) times daily as needed for dizziness.    Marland Kitchen omeprazole (PRILOSEC) 40 MG capsule Take 1 capsule (40 mg total) by mouth daily. (Patient taking differently: Take 40 mg by mouth daily as needed (heartburn). ) 90 capsule 3  . oxyCODONE-acetaminophen (PERCOCET) 10-325 MG tablet Take 1 tablet by mouth every 8 (eight) hours as needed for pain. 90 tablet 0  . temazepam (RESTORIL) 15 MG capsule Take 1 capsule (15 mg total) by mouth at bedtime as needed for sleep. 30 capsule 2  . tolterodine (DETROL LA) 2 MG 24 hr capsule Take 1 capsule (2 mg total) by mouth daily. 90 capsule 1  . traZODone (DESYREL) 100 MG tablet Take 2 tablets (200 mg total) by mouth at bedtime. (Patient taking differently: Take 100 mg by mouth at bedtime as needed for sleep. ) 180 tablet 3   No current facility-administered medications for this  visit.     Neurologic: Headache: No Seizure: No Paresthesias:No  Musculoskeletal: Strength & Muscle Tone: decreased Gait & Station: unsteady Patient leans: N/A  Psychiatric Specialty Exam: ROS  Blood pressure 140/82, pulse 67, height 5\' 4"  (1.626 m), weight 199 lb (90.3 kg).Body mass index is 34.16 kg/m.  General Appearance: Casual  Eye Contact:  Good  Speech:  Clear and Coherent  Volume:  Normal  Mood:  Euthymic  Affect:  Blunt  Thought Process: Normal   Orientation:  NA  Thought Content:  Logical  Suicidal Thoughts:  No  Homicidal Thoughts:  No  Memory:  NA  Judgement:  Fair  Insight:  Lacking  Psychomotor Activity:  Normal  Concentration:    Recall:  [place] of Knowledge:Fair  Language: Fair  Akathisia:  No  Handed:  Right  AIMS (if indicated):    Assets:  Desire for Improvement  ADL's:  Intact  Cognition:Normal   Sleep:     Treatment Plan Summary:  At this time this patient has no indication for being on a benzodiazepine. It is concerning that the patient was admitted to the hospital because she's also control over his Percocet. At this time there is always an issue between being on a benzodiazepine and opiate particularly in an elderly person has not been on these agents very long. She perhaps has been on Percocet for a while but her Klonopin and only been started in the last 9-12 months. My first intervention will be to discontinue her trazodone which is 200 mg is a potential risk for falls in this population is elderly and uses a cane. She started a high fall risk. Patient is agreed to change to Restoril 15 mg today we discussed the possibility of eventually changing Klonopin to a lower dose of Ativan. Today I spoke with her primary care  doctor will continue her Percocet watch her closely. The patient says she needs the Percocet for her chronic pain. She also says she eats the Klonopin for her anxiety. At this time my interventions will be ultimately taper off  her Klonopin switching to Ativan probably take her off of that as well. Over the next few months we will meet with her family and discuss the interventions how to best take care of this patient. The patient to return to see me in one or 2 months. The patient clearly is not suicidal.    Jerral Ralph, MD 5/24/20184:40 PM

## 2017-04-24 ENCOUNTER — Encounter: Payer: Self-pay | Admitting: Internal Medicine

## 2017-04-24 ENCOUNTER — Ambulatory Visit (INDEPENDENT_AMBULATORY_CARE_PROVIDER_SITE_OTHER): Payer: Medicare HMO | Admitting: Internal Medicine

## 2017-04-24 ENCOUNTER — Other Ambulatory Visit: Payer: Medicare HMO

## 2017-04-24 ENCOUNTER — Ambulatory Visit (INDEPENDENT_AMBULATORY_CARE_PROVIDER_SITE_OTHER)
Admission: RE | Admit: 2017-04-24 | Discharge: 2017-04-24 | Disposition: A | Discharge status: Home / Self Care | Payer: Medicare HMO | Source: Ambulatory Visit | Attending: Internal Medicine | Admitting: Internal Medicine

## 2017-04-24 DIAGNOSIS — M47816 Spondylosis without myelopathy or radiculopathy, lumbar region: Secondary | ICD-10-CM | POA: Diagnosis not present

## 2017-04-24 DIAGNOSIS — I10 Essential (primary) hypertension: Secondary | ICD-10-CM

## 2017-04-24 DIAGNOSIS — R05 Cough: Secondary | ICD-10-CM

## 2017-04-24 DIAGNOSIS — R059 Cough, unspecified: Secondary | ICD-10-CM

## 2017-04-24 DIAGNOSIS — R739 Hyperglycemia, unspecified: Secondary | ICD-10-CM

## 2017-04-24 DIAGNOSIS — R052 Subacute cough: Secondary | ICD-10-CM | POA: Insufficient documentation

## 2017-04-24 DIAGNOSIS — M5416 Radiculopathy, lumbar region: Secondary | ICD-10-CM

## 2017-04-24 DIAGNOSIS — M1711 Unilateral primary osteoarthritis, right knee: Secondary | ICD-10-CM

## 2017-04-24 MED ORDER — PROMETHAZINE-DM 6.25-15 MG/5ML PO SYRP: 2.5000 mL | ORAL_SOLUTION | Freq: Four times a day (QID) | ORAL | 0 refills | Status: DC | PRN

## 2017-04-24 MED ORDER — OXYCODONE-ACETAMINOPHEN 10-325 MG PO TABS: 1.0000 | ORAL_TABLET | Freq: Three times a day (TID) | ORAL | 0 refills | Status: DC | PRN

## 2017-04-24 MED ORDER — AZITHROMYCIN 250 MG PO TABS: ORAL_TABLET | ORAL | 1 refills | Status: DC

## 2017-04-24 MED ORDER — TEMAZEPAM 15 MG PO CAPS: 15.0000 mg | ORAL_CAPSULE | Freq: Every evening | ORAL | 2 refills | Status: DC | PRN

## 2017-04-24 NOTE — Assessment & Plan Note (Signed)
stable overall by history and exam, recent data reviewed with pt, and pt to continue medical treatment as before,  to f/u any worsening symptoms or concerns BP Readings from Last 3 Encounters:  04/24/17 138/76  02/10/17 138/74  02/04/17 182/89

## 2017-04-24 NOTE — Assessment & Plan Note (Signed)
Mild to mod, c/w bronchitis vs pna, for cxr, also for antibx course, cough med prn,  to f/u any worsening symptoms or concerns 

## 2017-04-24 NOTE — Progress Notes (Signed)
Subjective:    Patient ID: [name], female    DOB: Dec 26, 1932, 83 y.o.   MRN: [id number]  HPI  Here with acute onset mild to mod 5 days ST, HA, general weakness and malaise, with prod cough greenish sputum, but Pt denies chest pain, increased sob or doe, wheezing, orthopnea, PND, increased LE swelling, palpitations, dizziness or syncope, except for wheezing without sob last night with lying down   Pt denies polydipsia, polyuria,    Has ongoing chronic right knee pain, requires prn percocet, asks for refill today to not have to return for Dr Ronnald Ramp  Pt denies new neurological symptoms such as new headache, or facial or extremity weakness or numbness  Pt denies chest pain, orthopnea, PND, increased LE swelling, palpitations, dizziness or syncope.  Past Medical History:  Diagnosis Date  . Abdominal pain   . Anemia    NOS  . Anxiety   . Back pain   . Bruises easily   . Chronic diastolic CHF (congestive heart failure) ([place])    a. 06/2013 EF 65-70%.  . Chronic headaches    HISTORY OF   . Depression   . Diabetes mellitus    type II  DIET CONTROLLED  . Diarrhea   . Hepatitis    AGE 30S  . Hyperlipidemia   . Hypertension   . Mild aortic stenosis    a. 06/2013 Echo: EF 65-70%, mod LVH with focal basal hypertrophy, very mild AS.  Marland Kitchen Osteoarthritis   . Oxygen desaturation during sleep    USES 2 LITERS BEDTIME   VIA CPAP 06/2012 [facility]   . Shortness of breath    WITH EXERTION USES 2 L O2 BEDTIME  . Sleep apnea    CPAP WITH O2 2 LITERS 2013 ([facility])  . Wears dentures   . Wears glasses    Past Surgical History:  Procedure Laterality Date  . ABDOMINAL HYSTERECTOMY    . APPENDECTOMY    . CHOLECYSTECTOMY    . JOINT REPLACEMENT     LEFT KNEE   . KNEE ARTHROPLASTY Right 03/22/2015   Procedure: COMPUTER ASSISTED TOTAL KNEE ARTHROPLASTY;  Surgeon: Marybelle Killings, MD;  Location: [facility];  Service: Orthopedics;  Laterality: Right;  . KNEE ARTHROSCOPY     RIGHT  . LEFT AND RIGHT HEART  CATHETERIZATION WITH CORONARY ANGIOGRAM N/A 10/28/2013   Procedure: LEFT AND RIGHT HEART CATHETERIZATION WITH CORONARY ANGIOGRAM;  Surgeon: Blane Ohara, MD;  Location: [facility] CATH LAB;  Service: Cardiovascular;  Laterality: N/A;  . LEFT HEART CATHETERIZATION WITH CORONARY ANGIOGRAM N/A 11/14/2014   Procedure: LEFT HEART CATHETERIZATION WITH CORONARY ANGIOGRAM;  Surgeon: Josue Hector, MD;  Location: [facility] CATH LAB;  Service: Cardiovascular;  Laterality: N/A;  . REVISION TOTAL KNEE ARTHROPLASTY  2011  . ROTATOR CUFF REPAIR  2010  . SHOULDER ARTHROSCOPY  09/17/2012   Procedure: ARTHROSCOPY SHOULDER;  Surgeon: Sharmon Revere, MD;  Location: [place];  Service: Orthopedics;  Laterality: Left;  . TONSILLECTOMY    . TONSILLECTOMY      reports that she has never smoked. She has never used smokeless tobacco. She reports that she does not drink alcohol or use drugs. family history includes Alcohol abuse in her other; Cancer in her father; Emphysema in her brother; Heart disease in her brother and sister; Hypertension in her other; Kidney disease in her other; Mental illness in her other. Allergies  Allergen Reactions  . Amlodipine Other (See Comments)    Reaction:  Dizziness   .  Enalapril Cough  . Losartan Potassium Other (See Comments)    Reaction:  GI pain   . Morphine And Related Itching and Nausea And Vomiting  . Metformin Diarrhea   Current Outpatient Prescriptions on File Prior to Visit  Medication Sig Dispense Refill  . aspirin EC 325 MG tablet Take 325 mg by mouth daily.    . calcium carbonate (OS-CAL) 600 MG TABS tablet Take 600 mg by mouth 2 (two) times daily with a meal.    . carvedilol (COREG) 6.25 MG tablet Take 1 tablet (6.25 mg total) by mouth 2 (two) times daily with a meal. 60 tablet 11  . chlorthalidone (HYGROTON) 25 MG tablet Take 1 tablet (25 mg total) by mouth daily. 30 tablet 11  . cholecalciferol (VITAMIN D) 1000 units tablet Take 2,000 Units by mouth daily.    . clonazePAM  (KLONOPIN) 1 MG tablet Take 1 tablet (1 mg total) by mouth 2 (two) times daily. 60 tablet 3  . hydrALAZINE (APRESOLINE) 50 MG tablet Take 1 tablet (50 mg total) by mouth 3 (three) times daily. 270 tablet 3  . isosorbide mononitrate (ISMO,MONOKET) 20 MG tablet Take 1 tablet (20 mg total) by mouth 2 (two) times daily at 10 AM and 5 PM. 180 tablet 3  . meclizine (ANTIVERT) 25 MG tablet Take 25 mg by mouth 3 (three) times daily as needed for dizziness.    Marland Kitchen omeprazole (PRILOSEC) 40 MG capsule Take 1 capsule (40 mg total) by mouth daily. (Patient taking differently: Take 40 mg by mouth daily as needed (heartburn). ) 90 capsule 3  . tolterodine (DETROL LA) 2 MG 24 hr capsule Take 1 capsule (2 mg total) by mouth daily. 90 capsule 1  . traZODone (DESYREL) 100 MG tablet Take 2 tablets (200 mg total) by mouth at bedtime. (Patient taking differently: Take 100 mg by mouth at bedtime as needed for sleep. ) 180 tablet 3   No current facility-administered medications on file prior to visit.    Review of Systems   Constitutional: Negative for other unusual diaphoresis or sweats HENT: Negative for ear discharge or swelling Eyes: Negative for other worsening visual disturbances Respiratory: Negative for stridor or other swelling  Gastrointestinal: Negative for worsening distension or other blood Genitourinary: Negative for retention or other urinary change Musculoskeletal: Negative for other MSK pain or swelling Skin: Negative for color change or other new lesions Neurological: Negative for worsening tremors and other numbness  Psychiatric/Behavioral: Negative for worsening agitation or other fatigue All otherwise neg per pt     Objective:   Physical Exam BP 138/76   Pulse 67   Ht 5\' 2"  (1.575 m)   Wt 197 lb (89.4 kg)   SpO2 97%   BMI 36.03 kg/m  VS noted, mild ill Constitutional: Pt appears in NAD HENT: Head: NCAT.  Right Ear: External ear normal.  Left Ear: External ear normal.  Eyes: . Pupils  are equal, round, and reactive to light. Conjunctivae and EOM are normal Nose: without d/c or deformity Neck: Neck supple. Gross normal ROM Cardiovascular: Normal rate and regular rhythm.   Pulmonary/Chest: Effort normal and breath sounds decreased without rales or wheezing.  Abd:  Soft, NT, ND, + BS, no organomegaly Neurological: Pt is alert. At baseline orientation, motor grossly intact Skin: Skin is warm. No rashes, other new lesions, no LE edema Psychiatric: Pt behavior is normal without agitation  No other exam findings  02/04/2017 CHEST  2 VIEW - summary last cxr IMPRESSION: No edema  or consolidation.  Aortic atherosclerosis.  Lab Results  Component Value Date   WBC 10.7 (H) 02/03/2017   HGB 13.2 02/03/2017   HCT 40.2 02/03/2017   PLT 154 02/03/2017   GLUCOSE 100 (H) 02/03/2017   CHOL 222 (H) 06/11/2016   TRIG 279.0 (H) 06/11/2016   HDL 54.20 06/11/2016   LDLDIRECT 114.0 06/11/2016   LDLCALC 106 (H) 05/16/2015   ALT 24 02/03/2017   AST 23 02/03/2017   NA 141 02/03/2017   K 3.1 (L) 02/03/2017   CL 108 02/03/2017   CREATININE 0.56 02/03/2017   BUN 10 02/03/2017   CO2 25 02/03/2017   TSH 2.695 02/03/2017   INR 1.01 03/10/2015   HGBA1C 5.0 05/16/2015        Assessment & Plan:

## 2017-04-24 NOTE — Assessment & Plan Note (Signed)
With chronic pain - for pain med refill

## 2017-04-24 NOTE — Assessment & Plan Note (Signed)
stable overall by history and exam, recent data reviewed with pt, and pt to continue medical treatment as before,  to f/u any worsening symptoms or concerns Lab Results  Component Value Date   HGBA1C 5.0 05/16/2015

## 2017-04-24 NOTE — Patient Instructions (Signed)
Please take all new medication as prescribed - the antibiotic, and cough medicine as needed  Please continue all other medications as before, and refills have been done if requested - the pain medication  Please have the pharmacy call with any other refills you may need.  Please keep your appointments with your specialists as you may have planned  Please go to the XRAY Department in the Basement (go straight as you get off the elevator) for the x-ray testing  You will be contacted by phone if any changes need to be made immediately.  Otherwise, you will receive a letter about your results with an explanation, but please check with MyChart first.  Please remember to sign up for MyChart if you have not done so, as this will be important to you in the future with finding out test results, communicating by private email, and scheduling acute appointments online when needed.

## 2017-04-28 ENCOUNTER — Telehealth: Payer: Self-pay | Admitting: Internal Medicine

## 2017-04-28 NOTE — Telephone Encounter (Signed)
Pt would like a call back with her results from her chest Xray.

## 2017-05-06 ENCOUNTER — Ambulatory Visit: Payer: Medicare HMO | Admitting: Internal Medicine

## 2017-05-19 ENCOUNTER — Ambulatory Visit (HOSPITAL_COMMUNITY)
Admission: RE | Admit: 2017-05-19 | Discharge: 2017-05-19 | Disposition: A | Discharge status: Home / Self Care | Payer: Medicare HMO | Source: Ambulatory Visit | Attending: Internal Medicine | Admitting: Internal Medicine

## 2017-05-19 ENCOUNTER — Encounter: Payer: Self-pay | Admitting: Internal Medicine

## 2017-05-19 ENCOUNTER — Ambulatory Visit (INDEPENDENT_AMBULATORY_CARE_PROVIDER_SITE_OTHER): Payer: Medicare HMO | Admitting: Internal Medicine

## 2017-05-19 VITALS — BP 180/100 | HR 72 | Temp 98.8°F | Ht 62.0 in | Wt 203.2 lb

## 2017-05-19 DIAGNOSIS — R270 Ataxia, unspecified: Secondary | ICD-10-CM | POA: Diagnosis not present

## 2017-05-19 DIAGNOSIS — R519 Headache, unspecified: Secondary | ICD-10-CM

## 2017-05-19 DIAGNOSIS — H8110 Benign paroxysmal vertigo, unspecified ear: Secondary | ICD-10-CM

## 2017-05-19 DIAGNOSIS — I639 Cerebral infarction, unspecified: Secondary | ICD-10-CM | POA: Insufficient documentation

## 2017-05-19 DIAGNOSIS — R51 Headache: Secondary | ICD-10-CM | POA: Diagnosis not present

## 2017-05-19 DIAGNOSIS — M47816 Spondylosis without myelopathy or radiculopathy, lumbar region: Secondary | ICD-10-CM

## 2017-05-19 DIAGNOSIS — M1711 Unilateral primary osteoarthritis, right knee: Secondary | ICD-10-CM | POA: Diagnosis not present

## 2017-05-19 DIAGNOSIS — M5416 Radiculopathy, lumbar region: Secondary | ICD-10-CM

## 2017-05-19 DIAGNOSIS — I10 Essential (primary) hypertension: Secondary | ICD-10-CM

## 2017-05-19 DIAGNOSIS — I6782 Cerebral ischemia: Secondary | ICD-10-CM | POA: Diagnosis not present

## 2017-05-19 MED ORDER — PROMETHAZINE HCL 12.5 MG PO TABS: 12.5000 mg | ORAL_TABLET | Freq: Four times a day (QID) | ORAL | 0 refills | Status: DC | PRN

## 2017-05-19 MED ORDER — OXYCODONE-ACETAMINOPHEN 10-325 MG PO TABS: 1.0000 | ORAL_TABLET | Freq: Three times a day (TID) | ORAL | 0 refills | Status: DC | PRN

## 2017-05-19 NOTE — Progress Notes (Signed)
Subjective:  Patient ID: [name], female    DOB: 26-Dec-1932  Age: 83 y.o. MRN: [id number]  CC: Headache   HPI Samia MAKENZI BANNISTER presents for acute onset of headache about 4 days ago. The headache is described as a stabbing sensation in the right posterior area. She complains of worsening dizziness, vertigo, and ataxia. She's had a few episodes of nausea and vomiting. She complains of intermittent numbness across her face but no numbness in her upper or lower extremities. She thinks people are telling her that she walks and talks normally. She has a history of vertigo and has tried meclizine but has had no response to it.  Outpatient Medications Prior to Visit  Medication Sig Dispense Refill  . aspirin EC 325 MG tablet Take 325 mg by mouth daily.    . calcium carbonate (OS-CAL) 600 MG TABS tablet Take 600 mg by mouth 2 (two) times daily with a meal.    . carvedilol (COREG) 6.25 MG tablet Take 1 tablet (6.25 mg total) by mouth 2 (two) times daily with a meal. 60 tablet 11  . cholecalciferol (VITAMIN D) 1000 units tablet Take 2,000 Units by mouth daily.    . clonazePAM (KLONOPIN) 1 MG tablet Take 1 tablet (1 mg total) by mouth 2 (two) times daily. 60 tablet 3  . hydrALAZINE (APRESOLINE) 50 MG tablet Take 1 tablet (50 mg total) by mouth 3 (three) times daily. 270 tablet 3  . isosorbide mononitrate (ISMO,MONOKET) 20 MG tablet Take 1 tablet (20 mg total) by mouth 2 (two) times daily at 10 AM and 5 PM. 180 tablet 3  . omeprazole (PRILOSEC) 40 MG capsule Take 1 capsule (40 mg total) by mouth daily. (Patient taking differently: Take 40 mg by mouth daily as needed (heartburn). ) 90 capsule 3  . temazepam (RESTORIL) 15 MG capsule Take 1 capsule (15 mg total) by mouth at bedtime as needed for sleep. 30 capsule 2  . tolterodine (DETROL LA) 2 MG 24 hr capsule Take 1 capsule (2 mg total) by mouth daily. 90 capsule 1  . azithromycin (ZITHROMAX Z-PAK) 250 MG tablet 2 tabs by mouth day 1, then 1 per day 6  tablet 1  . chlorthalidone (HYGROTON) 25 MG tablet Take 1 tablet (25 mg total) by mouth daily. 30 tablet 11  . meclizine (ANTIVERT) 25 MG tablet Take 25 mg by mouth 3 (three) times daily as needed for dizziness.    Marland Kitchen oxyCODONE-acetaminophen (PERCOCET) 10-325 MG tablet Take 1 tablet by mouth every 8 (eight) hours as needed for pain. 90 tablet 0  . promethazine-dextromethorphan (PROMETHAZINE-DM) 6.25-15 MG/5ML syrup Take 2.5 mLs by mouth 4 (four) times daily as needed for cough. 118 mL 0  . traZODone (DESYREL) 100 MG tablet Take 2 tablets (200 mg total) by mouth at bedtime. (Patient taking differently: Take 100 mg by mouth at bedtime as needed for sleep. ) 180 tablet 3   No facility-administered medications prior to visit.     ROS Review of Systems  Constitutional: Negative.  Negative for appetite change, chills, diaphoresis, fatigue, fever and unexpected weight change.  HENT: Negative.  Negative for sinus pressure, trouble swallowing and voice change.   Eyes: Negative for photophobia, pain and visual disturbance.  Respiratory: Negative.  Negative for cough, chest tightness, shortness of breath and wheezing.   Cardiovascular: Negative.  Negative for chest pain, palpitations and leg swelling.  Gastrointestinal: Positive for nausea and vomiting. Negative for abdominal pain, constipation and diarrhea.  Endocrine: Negative.  Genitourinary: Negative.  Negative for difficulty urinating and dysuria.  Musculoskeletal: Positive for back pain. Negative for arthralgias, myalgias and neck pain.  Skin: Negative.  Negative for color change and rash.  Allergic/Immunologic: Negative.   Neurological: Positive for dizziness, numbness and headaches. Negative for tremors, seizures, syncope, facial asymmetry, speech difficulty, weakness and light-headedness.  Hematological: Negative for adenopathy. Does not bruise/bleed easily.  Psychiatric/Behavioral: Negative for dysphoric mood, sleep disturbance and suicidal  ideas. The patient is nervous/anxious.     Objective:  BP (!) 180/100 (BP Location: Left Arm, Patient Position: Sitting, Cuff Size: Large)   Pulse 72   Temp 98.8 F (37.1 C) (Oral)   Ht 5\' 2"  (1.575 m)   Wt 203 lb 4 oz (92.2 kg)   SpO2 97%   BMI 37.17 kg/m   BP Readings from Last 3 Encounters:  05/19/17 (!) 180/100  04/24/17 138/76  02/10/17 138/74    Wt Readings from Last 3 Encounters:  05/19/17 203 lb 4 oz (92.2 kg)  04/24/17 197 lb (89.4 kg)  02/10/17 194 lb 4 oz (88.1 kg)    Physical Exam  Constitutional: She is oriented to person, place, and time. No distress.  HENT:  Mouth/Throat: Oropharynx is clear and moist. No oropharyngeal exudate.  Eyes: Conjunctivae and EOM are normal. Pupils are equal, round, and reactive to light. Right eye exhibits no discharge. Left eye exhibits no discharge. No scleral icterus.  Neck: Normal range of motion. Neck supple. No JVD present. No thyromegaly present.  Cardiovascular: Normal rate, regular rhythm and intact distal pulses.  Exam reveals no gallop.   No murmur heard. Pulmonary/Chest: Effort normal and breath sounds normal. No respiratory distress. She has no wheezes. She has no rales. She exhibits no tenderness.  Abdominal: Soft. Bowel sounds are normal. She exhibits no distension and no mass. There is no tenderness. There is no rebound and no guarding.  Musculoskeletal: Normal range of motion. She exhibits no edema, tenderness or deformity.  Lymphadenopathy:    She has no cervical adenopathy.  Neurological: She is alert and oriented to person, place, and time. She displays no atrophy, no tremor and normal reflexes. No cranial nerve deficit or sensory deficit. She exhibits normal muscle tone. She displays no seizure activity. Coordination and gait abnormal. She displays Babinski's sign on the right side. She displays no Babinski's sign on the left side.  Reflex Scores:      Tricep reflexes are 0 on the right side and 0 on the left  side.      Bicep reflexes are 0 on the right side and 0 on the left side.      Brachioradialis reflexes are 0 on the right side and 0 on the left side.      Patellar reflexes are 0 on the right side and 0 on the left side.      Achilles reflexes are 0 on the right side and 0 on the left side. Skin: Skin is warm and dry. No rash noted. She is not diaphoretic. No erythema. No pallor.  Vitals reviewed.   Lab Results  Component Value Date   WBC 10.7 (H) 02/03/2017   HGB 13.2 02/03/2017   HCT 40.2 02/03/2017   PLT 154 02/03/2017   GLUCOSE 100 (H) 02/03/2017   CHOL 222 (H) 06/11/2016   TRIG 279.0 (H) 06/11/2016   HDL 54.20 06/11/2016   LDLDIRECT 114.0 06/11/2016   LDLCALC 106 (H) 05/16/2015   ALT 24 02/03/2017   AST 23 02/03/2017  NA 141 02/03/2017   K 3.1 (L) 02/03/2017   CL 108 02/03/2017   CREATININE 0.56 02/03/2017   BUN 10 02/03/2017   CO2 25 02/03/2017   TSH 2.695 02/03/2017   INR 1.01 03/10/2015   HGBA1C 5.0 05/16/2015    Dg Chest 2 View  Result Date: 04/25/2017 CLINICAL DATA:  Cough and congestion with fever EXAM: CHEST  2 VIEW COMPARISON:  February 04, 2017 and May 19, 2014 chest radiographs and chest CT May 19, 2014 FINDINGS: There is no edema or consolidation. The heart size and pulmonary vascularity are normal. There is aortic atherosclerosis. There is stable soft tissue fullness in the paratracheal regions bilaterally, likely due to thyroid prominence apparent on prior CT. No appreciable adenopathy. There is mild degenerative change in the thoracic spine. IMPRESSION: No edema or consolidation. Stable cardiac silhouette. Mild fullness in the paratracheal regions is stable and likely due to thyroid prominence. There is aortic atherosclerosis. Electronically Signed   By: Lowella Grip III M.D.   On: 04/25/2017 09:02   Dg Chest 2 View  Result Date: 04/25/2017 CLINICAL DATA:  Cough and congestion with fever EXAM: CHEST  2 VIEW COMPARISON:  February 04, 2017 and May 19, 2014  chest radiographs and chest CT May 19, 2014 FINDINGS: There is no edema or consolidation. The heart size and pulmonary vascularity are normal. There is aortic atherosclerosis. There is stable soft tissue fullness in the paratracheal regions bilaterally, likely due to thyroid prominence apparent on prior CT. No appreciable adenopathy. There is mild degenerative change in the thoracic spine. IMPRESSION: No edema or consolidation. Stable cardiac silhouette. Mild fullness in the paratracheal regions is stable and likely due to thyroid prominence. There is aortic atherosclerosis. Electronically Signed   By: Lowella Grip III M.D.   On: 04/25/2017 09:02   Mr Brain Wo Contrast  Result Date: 05/19/2017 CLINICAL DATA:  Headaches and ataxia EXAM: MRI HEAD WITHOUT CONTRAST TECHNIQUE: Multiplanar, multiecho pulse sequences of the brain and surrounding structures were obtained without intravenous contrast. COMPARISON:  Brain MRI 06/27/2016 FINDINGS: Brain: There is a partially empty sella, unchanged. There is no focal diffusion restriction to indicate acute infarct. There is early confluent hyperintense T2-weighted signal within the periventricular white matter, most often seen in the setting of chronic microvascular ischemia. There are old bilateral punctate cerebellar infarcts. No intraparenchymal hematoma or chronic microhemorrhage. Brain volume is normal for age without age-advanced or lobar predominant atrophy. The dura is normal and there is no extra-axial collection. Vascular: Major intracranial arterial and venous sinus flow voids are preserved. Skull and upper cervical spine: The visualized skull base, calvarium, upper cervical spine and extracranial soft tissues are normal. Sinuses/Orbits: Right maxillary sinus retention cyst. Normal orbits. IMPRESSION: Chronic microvascular ischemia and old bilateral cerebellar infarcts without acute intracranial abnormality. Unchanged examination compared to 06/27/2016.  Electronically Signed   By: Ulyses Jarred M.D.   On: 05/19/2017 16:50    Assessment & Plan:   Shaqueta was seen today for headache.  Diagnoses and all orders for this visit:  Intractable episodic headache, unspecified headache type- MRI is negative for NPH, ICH, subdural hematoma, CVA. Will treat the vertigo. -     MR BRAIN WO CONTRAST; Future  Ataxia- MRI is negative for any structural findings. Will treat the vertigo. -     MR BRAIN WO CONTRAST; Future  Lumbar radiculopathy -     oxyCODONE-acetaminophen (PERCOCET) 10-325 MG tablet; Take 1 tablet by mouth every 8 (eight) hours as needed for pain.  Primary osteoarthritis of right knee -     oxyCODONE-acetaminophen (PERCOCET) 10-325 MG tablet; Take 1 tablet by mouth every 8 (eight) hours as needed for pain.  Lumbar spondylosis -     oxyCODONE-acetaminophen (PERCOCET) 10-325 MG tablet; Take 1 tablet by mouth every 8 (eight) hours as needed for pain.  Benign paroxysmal positional vertigo, unspecified laterality- will offer Phenergan as needed for symptom relief. -     promethazine (PHENERGAN) 12.5 MG tablet; Take 1 tablet (12.5 mg total) by mouth every 6 (six) hours as needed for nausea or vomiting.   I have discontinued Ms. Niehoff's traZODone, meclizine, azithromycin, and promethazine-dextromethorphan. I am also having her start on promethazine. Additionally, I am having her maintain her calcium carbonate, carvedilol, cholecalciferol, aspirin EC, hydrALAZINE, isosorbide mononitrate, omeprazole, clonazePAM, tolterodine, temazepam, oxyCODONE-acetaminophen, and chlorthalidone.  Meds ordered this encounter  Medications  . oxyCODONE-acetaminophen (PERCOCET) 10-325 MG tablet    Sig: Take 1 tablet by mouth every 8 (eight) hours as needed for pain.    Dispense:  90 tablet    Refill:  0    Fill on or after 06/06/17  . promethazine (PHENERGAN) 12.5 MG tablet    Sig: Take 1 tablet (12.5 mg total) by mouth every 6 (six) hours as needed for nausea  or vomiting.    Dispense:  20 tablet    Refill:  0  . chlorthalidone (HYGROTON) 25 MG tablet    Sig: Take 1 tablet (25 mg total) by mouth daily.    Dispense:  90 tablet    Refill:  1     Follow-up: No Follow-up on file.  Scarlette Calico, MD

## 2017-05-20 ENCOUNTER — Other Ambulatory Visit: Payer: Self-pay | Admitting: Internal Medicine

## 2017-05-20 MED ORDER — CHLORTHALIDONE 25 MG PO TABS: 25.0000 mg | ORAL_TABLET | Freq: Every day | ORAL | 1 refills | Status: DC

## 2017-05-20 NOTE — Assessment & Plan Note (Signed)
Her blood pressure is not adequately well controlled She has not been taking chlorthalidone Will restart chlorthalidone

## 2017-05-20 NOTE — Patient Instructions (Signed)

## 2017-05-21 ENCOUNTER — Telehealth: Payer: Self-pay | Admitting: Internal Medicine

## 2017-05-21 DIAGNOSIS — H9203 Otalgia, bilateral: Secondary | ICD-10-CM

## 2017-05-21 NOTE — Telephone Encounter (Signed)
Called and would like a referral to a ENT, she is having pain in her ear that is radiating down her neck and causing her headaches.

## 2017-05-21 NOTE — Telephone Encounter (Signed)
Referral has been entered.

## 2017-05-23 NOTE — Telephone Encounter (Signed)
Pt daughter Caren Griffins would like a call back regarding the referral

## 2017-05-23 NOTE — Telephone Encounter (Signed)
Left msg for pt's daughter to call me 6/28 & 6/29

## 2017-06-02 DIAGNOSIS — R42 Dizziness and giddiness: Secondary | ICD-10-CM | POA: Diagnosis not present

## 2017-06-12 ENCOUNTER — Other Ambulatory Visit: Payer: Self-pay | Admitting: Internal Medicine

## 2017-06-12 ENCOUNTER — Ambulatory Visit (INDEPENDENT_AMBULATORY_CARE_PROVIDER_SITE_OTHER): Payer: Medicare HMO | Admitting: Internal Medicine

## 2017-06-12 ENCOUNTER — Encounter: Payer: Self-pay | Admitting: Internal Medicine

## 2017-06-12 ENCOUNTER — Other Ambulatory Visit (INDEPENDENT_AMBULATORY_CARE_PROVIDER_SITE_OTHER): Payer: Medicare HMO

## 2017-06-12 VITALS — BP 120/66 | HR 78 | Temp 97.5°F | Ht 62.0 in | Wt 209.5 lb

## 2017-06-12 DIAGNOSIS — I10 Essential (primary) hypertension: Secondary | ICD-10-CM

## 2017-06-12 DIAGNOSIS — E876 Hypokalemia: Secondary | ICD-10-CM

## 2017-06-12 DIAGNOSIS — M5416 Radiculopathy, lumbar region: Secondary | ICD-10-CM | POA: Diagnosis not present

## 2017-06-12 DIAGNOSIS — M47816 Spondylosis without myelopathy or radiculopathy, lumbar region: Secondary | ICD-10-CM

## 2017-06-12 DIAGNOSIS — M1711 Unilateral primary osteoarthritis, right knee: Secondary | ICD-10-CM | POA: Diagnosis not present

## 2017-06-12 DIAGNOSIS — L259 Unspecified contact dermatitis, unspecified cause: Secondary | ICD-10-CM | POA: Diagnosis not present

## 2017-06-12 DIAGNOSIS — E785 Hyperlipidemia, unspecified: Secondary | ICD-10-CM

## 2017-06-12 LAB — BASIC METABOLIC PANEL
BUN: 26 mg/dL — AB (ref 6–23)
CALCIUM: 9.5 mg/dL (ref 8.4–10.5)
CO2: 32 meq/L (ref 19–32)
CREATININE: 0.85 mg/dL (ref 0.40–1.20)
Chloride: 104 mEq/L (ref 96–112)
GFR: 82.28 mL/min (ref >60.00)
Glucose, Bld: 146 mg/dL — ABNORMAL HIGH (ref 70–99)
Potassium: 3.6 mEq/L (ref 3.5–5.1)
SODIUM: 141 meq/L (ref 135–145)

## 2017-06-12 LAB — LIPID PANEL
Cholesterol: 229 mg/dL — ABNORMAL HIGH (ref 0–200)
HDL: 63.3 mg/dL (ref >39.00)
NonHDL: 165.79
TRIGLYCERIDES: 233 mg/dL — AB (ref 0.0–149.0)
Total CHOL/HDL Ratio: 4
VLDL: 46.6 mg/dL — AB (ref 0.0–40.0)

## 2017-06-12 LAB — LDL CHOLESTEROL, DIRECT: Direct LDL: 121 mg/dL

## 2017-06-12 LAB — MAGNESIUM: Magnesium: 1.6 mg/dL (ref 1.5–2.5)

## 2017-06-12 MED ORDER — OXYCODONE-ACETAMINOPHEN 10-325 MG PO TABS: 1.0000 | ORAL_TABLET | Freq: Three times a day (TID) | ORAL | 0 refills | Status: DC | PRN

## 2017-06-12 MED ORDER — FLUOCINONIDE-E 0.05 % EX CREA: 1.0000 "application " | TOPICAL_CREAM | Freq: Two times a day (BID) | CUTANEOUS | 0 refills | Status: DC

## 2017-06-12 NOTE — Patient Instructions (Signed)
Contact Dermatitis Dermatitis is redness, soreness, and swelling (inflammation) of the skin. Contact dermatitis is a reaction to certain substances that touch the skin. You either touched something that irritated your skin, or you have allergies to something you touched. Follow these instructions at home: Skin Care  Moisturize your skin as needed.  Apply cool compresses to the affected areas.  Try taking a bath with: ? Epsom salts. Follow the instructions on the package. You can get these at a pharmacy or grocery store. ? Baking soda. Pour a small amount into the bath as told by your doctor. ? Colloidal oatmeal. Follow the instructions on the package. You can get this at a pharmacy or grocery store.  Try applying baking soda paste to your skin. Stir water into baking soda until it looks like paste.  Do not scratch your skin.  Bathe less often.  Bathe in lukewarm water. Avoid using hot water. Medicines  Take or apply over-the-counter and prescription medicines only as told by your doctor.  If you were prescribed an antibiotic medicine, take or apply your antibiotic as told by your doctor. Do not stop taking the antibiotic even if your condition starts to get better. General instructions  Keep all follow-up visits as told by your doctor. This is important.  Avoid the substance that caused your reaction. If you do not know what caused it, keep a journal to try to track what caused it. Write down: ? What you eat. ? What cosmetic products you use. ? What you drink. ? What you wear in the affected area. This includes jewelry.  If you were given a bandage (dressing), take care of it as told by your doctor. This includes when to change and remove it. Contact a doctor if:  You do not get better with treatment.  Your condition gets worse.  You have signs of infection such as: ? Swelling. ? Tenderness. ? Redness. ? Soreness. ? Warmth.  You have a fever.  You have new  symptoms. Get help right away if:  You have a very bad headache.  You have neck pain.  Your neck is stiff.  You throw up (vomit).  You feel very sleepy.  You see red streaks coming from the affected area.  Your bone or joint underneath the affected area becomes painful after the skin has healed.  The affected area turns darker.  You have trouble breathing. This information is not intended to replace advice given to you by your health care provider. Make sure you discuss any questions you have with your health care provider. Document Released: 09/08/2009 Document Revised: 04/18/2016 Document Reviewed: 03/29/2015 Elsevier Interactive Patient Education  2018 [employer]  

## 2017-06-12 NOTE — Progress Notes (Signed)
Subjective:  Patient ID: [name], female    DOB: 1933-10-23  Age: 83 y.o. MRN: [id number]  CC: Rash; Hyperlipidemia; and Hypertension   HPI [name] presents for the complaint of a 2 day hx of itchy rash over the left lower side of her neck.   Outpatient Medications Prior to Visit  Medication Sig Dispense Refill  . aspirin EC 325 MG tablet Take 325 mg by mouth daily.    . calcium carbonate (OS-CAL) 600 MG TABS tablet Take 600 mg by mouth 2 (two) times daily with a meal.    . chlorthalidone (HYGROTON) 25 MG tablet Take 1 tablet (25 mg total) by mouth daily. 90 tablet 1  . cholecalciferol (VITAMIN D) 1000 units tablet Take 2,000 Units by mouth daily.    . hydrALAZINE (APRESOLINE) 50 MG tablet Take 1 tablet (50 mg total) by mouth 3 (three) times daily. 270 tablet 3  . isosorbide mononitrate (ISMO,MONOKET) 20 MG tablet Take 1 tablet (20 mg total) by mouth 2 (two) times daily at 10 AM and 5 PM. 180 tablet 3  . meclizine (ANTIVERT) 25 MG tablet take 1 tablet by mouth three times a day if needed for dizziness 60 tablet 2  . omeprazole (PRILOSEC) 40 MG capsule Take 1 capsule (40 mg total) by mouth daily. (Patient taking differently: Take 40 mg by mouth daily as needed (heartburn). ) 90 capsule 3  . promethazine (PHENERGAN) 12.5 MG tablet Take 1 tablet (12.5 mg total) by mouth every 6 (six) hours as needed for nausea or vomiting. 20 tablet 0  . tolterodine (DETROL LA) 2 MG 24 hr capsule Take 1 capsule (2 mg total) by mouth daily. 90 capsule 1  . carvedilol (COREG) 6.25 MG tablet Take 1 tablet (6.25 mg total) by mouth 2 (two) times daily with a meal. 60 tablet 11  . clonazePAM (KLONOPIN) 1 MG tablet Take 1 tablet (1 mg total) by mouth 2 (two) times daily. 60 tablet 3  . oxyCODONE-acetaminophen (PERCOCET) 10-325 MG tablet Take 1 tablet by mouth every 8 (eight) hours as needed for pain. 90 tablet 0  . temazepam (RESTORIL) 15 MG capsule Take 1 capsule (15 mg total) by mouth at bedtime as needed  for sleep. 30 capsule 2   No facility-administered medications prior to visit.     ROS Review of Systems  Constitutional: Negative for appetite change, chills, fatigue and fever.  HENT: Negative.  Negative for sore throat and trouble swallowing.   Eyes: Negative.  Negative for visual disturbance.  Respiratory: Negative for cough, chest tightness, shortness of breath and wheezing.   Cardiovascular: Negative for chest pain, palpitations and leg swelling.  Gastrointestinal: Negative for abdominal pain, constipation, diarrhea, nausea and vomiting.  Endocrine: Negative.   Genitourinary: Negative.  Negative for decreased urine volume, difficulty urinating, enuresis, hematuria and urgency.  Musculoskeletal: Positive for arthralgias and back pain. Negative for myalgias and neck pain.  Skin: Negative.  Negative for color change and rash.  Neurological: Positive for dizziness. Negative for weakness.       She reports that her dizziness has lessened  Hematological: Negative for adenopathy. Does not bruise/bleed easily.  Psychiatric/Behavioral: Positive for decreased concentration and sleep disturbance. Negative for confusion and dysphoric mood. The patient is nervous/anxious.     Objective:  BP 120/66 (BP Location: Left Arm, Patient Position: Sitting, Cuff Size: Large)   Pulse 78   Temp (!) 97.5 F (36.4 C) (Oral)   Ht 5\' 2"  (1.575 m)  Wt 209 lb 8 oz (95 kg)   SpO2 98%   BMI 38.32 kg/m   BP Readings from Last 3 Encounters:  06/12/17 120/66  05/19/17 (!) 180/100  04/24/17 138/76    Wt Readings from Last 3 Encounters:  06/12/17 209 lb 8 oz (95 kg)  05/19/17 203 lb 4 oz (92.2 kg)  04/24/17 197 lb (89.4 kg)    Physical Exam  Constitutional: She is oriented to person, place, and time.  Non-toxic appearance. She does not have a sickly appearance. She does not appear ill. No distress.  HENT:  Mouth/Throat: Oropharynx is clear and moist. No oropharyngeal exudate.  Eyes: Conjunctivae  are normal. Right eye exhibits no discharge. Left eye exhibits no discharge. No scleral icterus.  Neck: Normal range of motion. Neck supple. No JVD present. No thyromegaly present.    Cardiovascular: Normal rate, regular rhythm and intact distal pulses.  Exam reveals no gallop and no friction rub.   No murmur heard. Pulmonary/Chest: Effort normal and breath sounds normal. No respiratory distress. She has no wheezes. She has no rales. She exhibits no tenderness.  Abdominal: Soft. Bowel sounds are normal. She exhibits no distension and no mass. There is no tenderness. There is no rebound and no guarding.  Musculoskeletal: Normal range of motion. She exhibits no edema, tenderness or deformity.  Lymphadenopathy:    She has no cervical adenopathy.  Neurological: She is alert and oriented to person, place, and time.  Skin: Skin is warm and dry. Rash noted. Rash is papular. Rash is not nodular, not pustular, not vesicular and not urticarial. She is not diaphoretic. No pallor.  Psychiatric: She has a normal mood and affect. Her behavior is normal. Judgment and thought content normal.  Vitals reviewed.   Lab Results  Component Value Date   WBC 10.7 (H) 02/03/2017   HGB 13.2 02/03/2017   HCT 40.2 02/03/2017   PLT 154 02/03/2017   GLUCOSE 146 (H) 06/12/2017   CHOL 229 (H) 06/12/2017   TRIG 233.0 (H) 06/12/2017   HDL 63.30 06/12/2017   LDLDIRECT 121.0 06/12/2017   LDLCALC 106 (H) 05/16/2015   ALT 24 02/03/2017   AST 23 02/03/2017   NA 141 06/12/2017   K 3.6 06/12/2017   CL 104 06/12/2017   CREATININE 0.85 06/12/2017   BUN 26 (H) 06/12/2017   CO2 32 06/12/2017   TSH 2.695 02/03/2017   INR 1.01 03/10/2015   HGBA1C 5.0 05/16/2015    Mr Brain Wo Contrast  Result Date: 05/19/2017 CLINICAL DATA:  Headaches and ataxia EXAM: MRI HEAD WITHOUT CONTRAST TECHNIQUE: Multiplanar, multiecho pulse sequences of the brain and surrounding structures were obtained without intravenous contrast.  COMPARISON:  Brain MRI 06/27/2016 FINDINGS: Brain: There is a partially empty sella, unchanged. There is no focal diffusion restriction to indicate acute infarct. There is early confluent hyperintense T2-weighted signal within the periventricular white matter, most often seen in the setting of chronic microvascular ischemia. There are old bilateral punctate cerebellar infarcts. No intraparenchymal hematoma or chronic microhemorrhage. Brain volume is normal for age without age-advanced or lobar predominant atrophy. The dura is normal and there is no extra-axial collection. Vascular: Major intracranial arterial and venous sinus flow voids are preserved. Skull and upper cervical spine: The visualized skull base, calvarium, upper cervical spine and extracranial soft tissues are normal. Sinuses/Orbits: Right maxillary sinus retention cyst. Normal orbits. IMPRESSION: Chronic microvascular ischemia and old bilateral cerebellar infarcts without acute intracranial abnormality. Unchanged examination compared to 06/27/2016. Electronically Signed   By:  Ulyses Jarred M.D.   On: 05/19/2017 16:50    Assessment & Plan:    I have discontinued Ms. Conover's clonazePAM and temazepam. I am also having her start on fluocinonide-emollient. Additionally, I am having her maintain her calcium carbonate, cholecalciferol, aspirin EC, hydrALAZINE, isosorbide mononitrate, omeprazole, tolterodine, promethazine, meclizine, chlorthalidone, and oxyCODONE-acetaminophen.  Meds ordered this encounter  Medications  . fluocinonide-emollient (LIDEX-E) 0.05 % cream    Sig: Apply 1 application topically 2 (two) times daily.    Dispense:  30 g    Refill:  0  . DISCONTD: oxyCODONE-acetaminophen (PERCOCET) 10-325 MG tablet    Sig: Take 1 tablet by mouth every 8 (eight) hours as needed for pain.    Dispense:  90 tablet    Refill:  0    Fill on or after 06/12/17  . DISCONTD: oxyCODONE-acetaminophen (PERCOCET) 10-325 MG tablet    Sig: Take 1  tablet by mouth every 8 (eight) hours as needed for pain.    Dispense:  90 tablet    Refill:  0    Fill on or after 07/13/17  . oxyCODONE-acetaminophen (PERCOCET) 10-325 MG tablet    Sig: Take 1 tablet by mouth every 8 (eight) hours as needed for pain.    Dispense:  90 tablet    Refill:  0    Fill on or after 08/13/17     Follow-up: Return in about 6 months (around 12/13/2017).  Scarlette Calico, MD

## 2017-06-15 NOTE — Assessment & Plan Note (Signed)
Her blood pressure is well-controlled A letter lites and renal function are normal

## 2017-06-15 NOTE — Assessment & Plan Note (Signed)
Will treat with a topical steroid

## 2017-06-15 NOTE — Assessment & Plan Note (Signed)
She is getting symptom relief with the current opioid prescription. We'll continue the current dose.

## 2017-06-15 NOTE — Assessment & Plan Note (Signed)
Her potassium level is in the normal range at 3.6. Will continue the current potassium supplement.

## 2017-06-15 NOTE — Assessment & Plan Note (Signed)
She has achieved her LDL goal 

## 2017-06-18 ENCOUNTER — Ambulatory Visit (INDEPENDENT_AMBULATORY_CARE_PROVIDER_SITE_OTHER): Payer: Medicare HMO | Admitting: Psychiatry

## 2017-06-18 DIAGNOSIS — Z811 Family history of alcohol abuse and dependence: Secondary | ICD-10-CM | POA: Diagnosis not present

## 2017-06-18 DIAGNOSIS — F4323 Adjustment disorder with mixed anxiety and depressed mood: Secondary | ICD-10-CM

## 2017-06-18 MED ORDER — HYDROXYZINE PAMOATE 25 MG PO CAPS: ORAL_CAPSULE | ORAL | 5 refills | Status: DC

## 2017-06-18 MED ORDER — TEMAZEPAM 15 MG PO CAPS: 15.0000 mg | ORAL_CAPSULE | Freq: Every evening | ORAL | 4 refills | Status: DC | PRN

## 2017-06-18 MED ORDER — CLONAZEPAM 1 MG PO TABS: ORAL_TABLET | ORAL | 4 refills | Status: DC

## 2017-06-18 MED ORDER — CLONAZEPAM 0.5 MG PO TABS: 0.5000 mg | ORAL_TABLET | Freq: Two times a day (BID) | ORAL | 4 refills | Status: DC

## 2017-06-18 MED ORDER — CLONAZEPAM 0.5 MG PO TABS: ORAL_TABLET | ORAL | 4 refills | Status: DC

## 2017-06-18 NOTE — Progress Notes (Signed)
Psychiatric Initial Adult Assessment   Patient Identification: [name] MRN:  [id number] Date of Evaluation:  06/18/2017 Referral Source: Dr. Scarlette Calico Chief Complaint:   Visit Diagnosis: No diagnosis found.  History of Present Illness:   Today the patient is seen on time. Patient very well. She has some mild anxiety but is not significant. She describes dizziness evaluated for. She saw an ENT physician they're not clear without problems. The patient denies daily depression. Patient denies symptoms of panic is generalized anxiety disorder or obsessive-compulsive disorder. She was started on Klonopin about a urine half ago because of unexplained anxiety by her primary care doctor. The patient has been on it since. This patient lost control use of Percocet and required hospitalization. He now takes a fixed dose Percocet. She's been taking Klonopin 1 mg twice a day is asking for increased her benzodiazepines. In close evaluation I find little evidence of an I disorder patient has not had any falls despite the fact that she feels dizzy. She doesn't use any alcohol. She is sleeping and eating well. She says her sleep is much improved since being on Restoril which I started her last visit. The patient is active she does the [employer] she likes gardening. She has some issues with her family preoperatively with a son who's a substance. Overall though she has no significant new stresses. Associated Signs/Symptoms: Depression Symptoms:  anxiety, (Hypo) Manic Symptoms:   Anxiety Symptoms:   Psychotic Symptoms:   PTSD Symptoms:   Past Psychiatric History  Previous Psychotropic Medications: Klonopin 1 mg twice a day, trazodone 200 mg   Substance Abuse History in the last 12 months:    Consequences of Substance Abuse:   Past Medical History:  Past Medical History:  Diagnosis Date  . Abdominal pain   . Anemia    NOS  . Anxiety   . Back pain   . Bruises easily   . Chronic diastolic CHF  (congestive heart failure) ([place])    a. 06/2013 EF 65-70%.  . Chronic headaches    HISTORY OF   . Depression   . Diabetes mellitus    type II  DIET CONTROLLED  . Diarrhea   . Hepatitis    AGE 30S  . Hyperlipidemia   . Hypertension   . Mild aortic stenosis    a. 06/2013 Echo: EF 65-70%, mod LVH with focal basal hypertrophy, very mild AS.  Marland Kitchen Osteoarthritis   . Oxygen desaturation during sleep    USES 2 LITERS BEDTIME   VIA CPAP 06/2012 [facility]   . Shortness of breath    WITH EXERTION USES 2 L O2 BEDTIME  . Sleep apnea    CPAP WITH O2 2 LITERS 2013 ([facility])  . Wears dentures   . Wears glasses     Past Surgical History:  Procedure Laterality Date  . ABDOMINAL HYSTERECTOMY    . APPENDECTOMY    . CHOLECYSTECTOMY    . JOINT REPLACEMENT     LEFT KNEE   . KNEE ARTHROPLASTY Right 03/22/2015   Procedure: COMPUTER ASSISTED TOTAL KNEE ARTHROPLASTY;  Surgeon: Marybelle Killings, MD;  Location: [facility];  Service: Orthopedics;  Laterality: Right;  . KNEE ARTHROSCOPY     RIGHT  . LEFT AND RIGHT HEART CATHETERIZATION WITH CORONARY ANGIOGRAM N/A 10/28/2013   Procedure: LEFT AND RIGHT HEART CATHETERIZATION WITH CORONARY ANGIOGRAM;  Surgeon: Blane Ohara, MD;  Location: [facility] CATH LAB;  Service: Cardiovascular;  Laterality: N/A;  . LEFT HEART CATHETERIZATION WITH  CORONARY ANGIOGRAM N/A 11/14/2014   Procedure: LEFT HEART CATHETERIZATION WITH CORONARY ANGIOGRAM;  Surgeon: Josue Hector, MD;  Location: [facility] CATH LAB;  Service: Cardiovascular;  Laterality: N/A;  . REVISION TOTAL KNEE ARTHROPLASTY  2011  . ROTATOR CUFF REPAIR  2010  . SHOULDER ARTHROSCOPY  09/17/2012   Procedure: ARTHROSCOPY SHOULDER;  Surgeon: Sharmon Revere, MD;  Location: [place];  Service: Orthopedics;  Laterality: Left;  . TONSILLECTOMY    . TONSILLECTOMY      Family Psychiatric History:   Family History:  Family History  Problem Relation Age of Onset  . Cancer Father        prostate cancer  . Heart disease Sister   . Heart  disease Brother   . Alcohol abuse Other   . Hypertension Other   . Kidney disease Other   . Mental illness Other   . Emphysema Brother     Social History:   Social History   Social History  . Marital status: Widowed    Spouse name: N/A  . Number of children: 5  . Years of education: N/A   Occupational History  . retired  Retired    Museum/gallery curator at [employer] Topics  . Smoking status: Never Smoker  . Smokeless tobacco: Never Used  . Alcohol use No  . Drug use: No  . Sexual activity: No   Other Topics Concern  . Not on file   Social History Narrative  . No narrative on file    Additional Social History:   Allergies:   Allergies  Allergen Reactions  . Amlodipine Other (See Comments)    Reaction:  Dizziness   . Enalapril Cough  . Losartan Potassium Other (See Comments)    Reaction:  GI pain   . Morphine And Related Itching and Nausea And Vomiting  . Metformin Diarrhea    Metabolic Disorder Labs: Lab Results  Component Value Date   HGBA1C 5.0 05/16/2015   No results found for: PROLACTIN Lab Results  Component Value Date   CHOL 229 (H) 06/12/2017   TRIG 233.0 (H) 06/12/2017   HDL 63.30 06/12/2017   CHOLHDL 4 06/12/2017   VLDL 46.6 (H) 06/12/2017   LDLCALC 106 (H) 05/16/2015   LDLCALC 116 (H) 05/25/2014     Current Medications: Current Outpatient Prescriptions  Medication Sig Dispense Refill  . aspirin EC 325 MG tablet Take 325 mg by mouth daily.    . calcium carbonate (OS-CAL) 600 MG TABS tablet Take 600 mg by mouth 2 (two) times daily with a meal.    . carvedilol (COREG) 6.25 MG tablet take 1 tablet by mouth twice a day with meals 60 tablet 11  . chlorthalidone (HYGROTON) 25 MG tablet Take 1 tablet (25 mg total) by mouth daily. 90 tablet 1  . cholecalciferol (VITAMIN D) 1000 units tablet Take 2,000 Units by mouth daily.    . clonazePAM (KLONOPIN) 1 MG tablet 1 qam 30 tablet 4  . fluocinonide-emollient (LIDEX-E) 0.05 % cream Apply 1  application topically 2 (two) times daily. 30 g 0  . hydrALAZINE (APRESOLINE) 50 MG tablet Take 1 tablet (50 mg total) by mouth 3 (three) times daily. 270 tablet 3  . hydrOXYzine (VISTARIL) 25 MG capsule 1 at noon  And 1  At dinner 60 capsule 5  . isosorbide mononitrate (ISMO,MONOKET) 20 MG tablet Take 1 tablet (20 mg total) by mouth 2 (two) times daily at 10 AM and 5 PM. 180 tablet 3  .  meclizine (ANTIVERT) 25 MG tablet take 1 tablet by mouth three times a day if needed for dizziness 60 tablet 2  . omeprazole (PRILOSEC) 40 MG capsule Take 1 capsule (40 mg total) by mouth daily. (Patient taking differently: Take 40 mg by mouth daily as needed (heartburn). ) 90 capsule 3  . oxyCODONE-acetaminophen (PERCOCET) 10-325 MG tablet Take 1 tablet by mouth every 8 (eight) hours as needed for pain. 90 tablet 0  . promethazine (PHENERGAN) 12.5 MG tablet Take 1 tablet (12.5 mg total) by mouth every 6 (six) hours as needed for nausea or vomiting. 20 tablet 0  . temazepam (RESTORIL) 15 MG capsule Take 1 capsule (15 mg total) by mouth at bedtime as needed for sleep. 30 capsule 4  . tolterodine (DETROL LA) 2 MG 24 hr capsule Take 1 capsule (2 mg total) by mouth daily. 90 capsule 1   No current facility-administered medications for this visit.     Neurologic: Headache: No Seizure: No Paresthesias:No  Musculoskeletal: Strength & Muscle Tone: decreased Gait & Station: unsteady Patient leans: N/A  Psychiatric Specialty Exam: ROS  There were no vitals taken for this visit.There is no height or weight on file to calculate BMI.  General Appearance: Casual  Eye Contact:  Good  Speech:  Clear and Coherent  Volume:  Normal  Mood:  Euthymic  Affect:  Blunt  Thought Process: Normal   Orientation:  NA  Thought Content:  Logical  Suicidal Thoughts:  No  Homicidal Thoughts:  No  Memory:  NA  Judgement:  Fair  Insight:  Lacking  Psychomotor Activity:  Normal  Concentration:    Recall:  [place] of  Knowledge:Fair  Language: Fair  Akathisia:  No  Handed:  Right  AIMS (if indicated):    Assets:  Desire for Improvement  ADL's:  Intact  Cognition:Normal   Sleep:     Treatment Plan Summary: At this time the patient will continue taking Klonopin but we will reduce the dose. She claims she was taking 1 mg of Klonopin twice a day. Today will reduce his 1 mg in the morning. He'll start on Vistaril 25 mg at lunch and dinner. Continue taking Restoril as prescribed. This patient return to see me in 3 months. At that time will consider discontinuing her Klonopin completely and perhaps increasing her Vistaril necessary. For the time being she'll continue her sleeping aid. The patient is not somnolent or homicidal. She is functioning fairly well. She denies any chest pain or shortness of breath or any neurological symptoms at this time.   Jerral Ralph, MD 7/25/20182:10 PM

## 2017-06-24 ENCOUNTER — Telehealth (HOSPITAL_COMMUNITY): Payer: Self-pay

## 2017-06-24 NOTE — Telephone Encounter (Signed)
Patient called and said that she can not take the Vistaril, it makes her a zombie. She has stopped it and would like to know if you can recommend something else. Please review and advise, thank you

## 2017-07-03 ENCOUNTER — Other Ambulatory Visit (HOSPITAL_COMMUNITY): Payer: Self-pay

## 2017-07-03 MED ORDER — HYDROXYZINE HCL 10 MG PO TABS: 10.0000 mg | ORAL_TABLET | Freq: Two times a day (BID) | ORAL | 0 refills | Status: DC

## 2017-07-15 ENCOUNTER — Other Ambulatory Visit: Payer: Self-pay | Admitting: Internal Medicine

## 2017-07-15 DIAGNOSIS — N3281 Overactive bladder: Secondary | ICD-10-CM

## 2017-08-05 ENCOUNTER — Emergency Department (HOSPITAL_BASED_OUTPATIENT_CLINIC_OR_DEPARTMENT_OTHER)
Admission: EM | Admit: 2017-08-05 | Discharge: 2017-08-05 | Disposition: A | Discharge status: Home / Self Care | Payer: Medicare HMO | Attending: Emergency Medicine | Admitting: Emergency Medicine

## 2017-08-05 ENCOUNTER — Emergency Department (HOSPITAL_BASED_OUTPATIENT_CLINIC_OR_DEPARTMENT_OTHER): Payer: Medicare HMO

## 2017-08-05 ENCOUNTER — Emergency Department (HOSPITAL_COMMUNITY)
Admission: EM | Admit: 2017-08-05 | Discharge: 2017-08-05 | Discharge status: Absent without leave | Payer: Medicare HMO | Source: Home / Self Care

## 2017-08-05 ENCOUNTER — Encounter (HOSPITAL_BASED_OUTPATIENT_CLINIC_OR_DEPARTMENT_OTHER): Payer: Self-pay | Admitting: Emergency Medicine

## 2017-08-05 DIAGNOSIS — S0093XA Contusion of unspecified part of head, initial encounter: Secondary | ICD-10-CM | POA: Diagnosis not present

## 2017-08-05 DIAGNOSIS — Y939 Activity, unspecified: Secondary | ICD-10-CM | POA: Insufficient documentation

## 2017-08-05 DIAGNOSIS — M25512 Pain in left shoulder: Secondary | ICD-10-CM | POA: Insufficient documentation

## 2017-08-05 DIAGNOSIS — Y999 Unspecified external cause status: Secondary | ICD-10-CM | POA: Insufficient documentation

## 2017-08-05 DIAGNOSIS — S0083XA Contusion of other part of head, initial encounter: Secondary | ICD-10-CM | POA: Diagnosis not present

## 2017-08-05 DIAGNOSIS — W0110XA Fall on same level from slipping, tripping and stumbling with subsequent striking against unspecified object, initial encounter: Secondary | ICD-10-CM | POA: Diagnosis not present

## 2017-08-05 DIAGNOSIS — M542 Cervicalgia: Secondary | ICD-10-CM | POA: Diagnosis not present

## 2017-08-05 DIAGNOSIS — S199XXA Unspecified injury of neck, initial encounter: Secondary | ICD-10-CM | POA: Diagnosis not present

## 2017-08-05 DIAGNOSIS — E119 Type 2 diabetes mellitus without complications: Secondary | ICD-10-CM | POA: Diagnosis not present

## 2017-08-05 DIAGNOSIS — I5032 Chronic diastolic (congestive) heart failure: Secondary | ICD-10-CM | POA: Insufficient documentation

## 2017-08-05 DIAGNOSIS — Z79899 Other long term (current) drug therapy: Secondary | ICD-10-CM | POA: Diagnosis not present

## 2017-08-05 DIAGNOSIS — Z7982 Long term (current) use of aspirin: Secondary | ICD-10-CM | POA: Insufficient documentation

## 2017-08-05 DIAGNOSIS — W19XXXA Unspecified fall, initial encounter: Secondary | ICD-10-CM

## 2017-08-05 DIAGNOSIS — Z96652 Presence of left artificial knee joint: Secondary | ICD-10-CM | POA: Insufficient documentation

## 2017-08-05 DIAGNOSIS — S0990XA Unspecified injury of head, initial encounter: Secondary | ICD-10-CM | POA: Diagnosis not present

## 2017-08-05 DIAGNOSIS — I110 Hypertensive heart disease with heart failure: Secondary | ICD-10-CM | POA: Diagnosis not present

## 2017-08-05 DIAGNOSIS — Y929 Unspecified place or not applicable: Secondary | ICD-10-CM | POA: Diagnosis not present

## 2017-08-05 DIAGNOSIS — S4992XA Unspecified injury of left shoulder and upper arm, initial encounter: Secondary | ICD-10-CM | POA: Diagnosis not present

## 2017-08-05 MED ORDER — ONDANSETRON 4 MG PO TBDP
4.0000 mg | ORAL_TABLET | Freq: Once | ORAL | Status: AC
  Administered 2017-08-05: 4 mg via ORAL
  Filled 2017-08-05: qty 1

## 2017-08-05 NOTE — ED Triage Notes (Signed)
Patient reports that this am she was getting her pants on and she fell hitting her head. AS the day progressed she started to have pain to her neck only when she moves her neck. Patient has a hematoma to her right forehead. The pateint reports a little nausea. Patient is alert and orientedt and denies any LOC

## 2017-08-05 NOTE — Discharge Instructions (Signed)
Please read and follow all provided instructions.  Your diagnoses today include:  1. Contusion of head, unspecified part of head, initial encounter   2. Neck pain   3. Acute pain of left shoulder   4. Fall, initial encounter     Tests performed today include:  CT scan of your head and neck that did not show any serious injury.  X-ray of your shoulder - no broken bones  Vital signs. See below for your results today.   Medications prescribed:   None  Take any prescribed medications only as directed.  Home care instructions:  Follow any educational materials contained in this packet.  BE VERY CAREFUL not to take multiple medicines containing Tylenol (also called acetaminophen). Doing so can lead to an overdose which can damage your liver and cause liver failure and possibly death.   Follow-up instructions: Please follow-up with your primary care provider in the next 3 days for further evaluation of your symptoms.   Return instructions:  SEEK IMMEDIATE MEDICAL ATTENTION IF:  There is confusion or drowsiness (although children frequently become drowsy after injury).   You cannot awaken the injured person.   You have more than one episode of vomiting.   You notice dizziness or unsteadiness which is getting worse, or inability to walk.   You have convulsions or unconsciousness.   You experience severe, persistent headaches not relieved by Tylenol.  You cannot use arms or legs normally.   There are changes in pupil sizes. (This is the black center in the colored part of the eye)   There is clear or bloody discharge from the nose or ears.   You have change in speech, vision, swallowing, or understanding.   Localized weakness, numbness, tingling, or change in bowel or bladder control.  You have any other emergent concerns.  Additional Information: You have had a head injury which does not appear to require admission at this time.  Your vital signs today were: BP  (!) 144/79 (BP Location: Left Arm)    Pulse 73    Temp 98.4 F (36.9 C) (Oral)    Resp 18    Ht 5\' 2"  (1.575 m)    Wt 90.7 kg (200 lb)    SpO2 98%    BMI 36.58 kg/m  If your blood pressure (BP) was elevated above 135/85 this visit, please have this repeated by your doctor within one month. --------------

## 2017-08-05 NOTE — ED Provider Notes (Signed)
[facility] Provider Note   CSN: [id number] Arrival date & time: 08/05/17  1937     History   Chief Complaint Chief Complaint  Patient presents with  . Fall    HPI [name] is a 83 y.o. female.  Patient with history of dizziness, on daily baby aspirin -- presents with complaint of headache injury and neck pain after a fall this morning. Patient states that she was trying to put her pants on and fell forward striking the right side of her head on a nightstand and the left side of her head on the ground. She has a minor headache and neck pain is worse with movement. She is concerned about her head and neck. She also complains of shoulder pain, worse on the left. Patient did not lose consciousness. She states that she laid on the ground for several minutes after the fall. She has been ambulatory since then. Her dizziness has been worked up by her doctors and she does not why this occur, however today it was no different than her baseline. No associated chest pain or shortness of breath.      Past Medical History:  Diagnosis Date  . Abdominal pain   . Anemia    NOS  . Anxiety   . Back pain   . Bruises easily   . Chronic diastolic CHF (congestive heart failure) ([place])    a. 06/2013 EF 65-70%.  . Chronic headaches    HISTORY OF   . Depression   . Diabetes mellitus    type II  DIET CONTROLLED  . Diarrhea   . Hepatitis    AGE 30S  . Hyperlipidemia   . Hypertension   . Mild aortic stenosis    a. 06/2013 Echo: EF 65-70%, mod LVH with focal basal hypertrophy, very mild AS.  Marland Kitchen Osteoarthritis   . Oxygen desaturation during sleep    USES 2 LITERS BEDTIME   VIA CPAP 06/2012 [facility]   . Shortness of breath    WITH EXERTION USES 2 L O2 BEDTIME  . Sleep apnea    CPAP WITH O2 2 LITERS 2013 ([facility])  . Wears dentures   . Wears glasses     Patient Active Problem List   Diagnosis Date Noted  . Contact dermatitis and eczema 06/12/2017  . Intractable episodic  headache 05/19/2017  . Major depressive disorder, recurrent episode with anxious distress ([place]) 02/04/2017  . Dementia arising in the senium and presenium 06/27/2016  . Obesity, Class II, BMI 35-39.9, with comorbidity 05/15/2016  . Insomnia w/ sleep apnea 10/16/2015  . Other constipation 03/16/2015  . IBS (irritable bowel syndrome) 02/27/2015  . Lumbar spondylosis 02/10/2015  . Lumbar radiculopathy 02/10/2015  . Osteoarthritis of right knee 02/10/2015  . Hashimoto's thyroiditis 05/05/2014  . Hyperglycemia 02/21/2014  . Adrenal incidentaloma ([place]) 11/04/2013  . Benign paroxysmal positional vertigo 07/15/2013  . Aortic stenosis, mild 07/14/2013  . Hypokalemia 04/16/2013  . OAB (overactive bladder) 02/24/2013  . GERD with stricture 12/24/2012  . Sleep-related hypoventilation 07/30/2012  . OSA (obstructive sleep apnea) 04/16/2012  . Osteopenia 04/16/2012  . Preventative health care 05/24/2011  . VITAMIN D DEFICIENCY 12/04/2010  . Hyperlipidemia with target LDL less than 130 12/03/2010  . GAD (generalized anxiety disorder) 12/03/2010  . Essential hypertension 12/03/2010    Past Surgical History:  Procedure Laterality Date  . ABDOMINAL HYSTERECTOMY    . APPENDECTOMY    . CHOLECYSTECTOMY    . JOINT REPLACEMENT  LEFT KNEE   . KNEE ARTHROPLASTY Right 03/22/2015   Procedure: COMPUTER ASSISTED TOTAL KNEE ARTHROPLASTY;  Surgeon: Marybelle Killings, MD;  Location: [facility];  Service: Orthopedics;  Laterality: Right;  . KNEE ARTHROSCOPY     RIGHT  . LEFT AND RIGHT HEART CATHETERIZATION WITH CORONARY ANGIOGRAM N/A 10/28/2013   Procedure: LEFT AND RIGHT HEART CATHETERIZATION WITH CORONARY ANGIOGRAM;  Surgeon: Blane Ohara, MD;  Location: [facility] CATH LAB;  Service: Cardiovascular;  Laterality: N/A;  . LEFT HEART CATHETERIZATION WITH CORONARY ANGIOGRAM N/A 11/14/2014   Procedure: LEFT HEART CATHETERIZATION WITH CORONARY ANGIOGRAM;  Surgeon: Josue Hector, MD;  Location: [facility] CATH LAB;  Service:  Cardiovascular;  Laterality: N/A;  . REVISION TOTAL KNEE ARTHROPLASTY  2011  . ROTATOR CUFF REPAIR  2010  . SHOULDER ARTHROSCOPY  09/17/2012   Procedure: ARTHROSCOPY SHOULDER;  Surgeon: Sharmon Revere, MD;  Location: [facility];  Service: Orthopedics;  Laterality: Left;  . TONSILLECTOMY    . TONSILLECTOMY      OB History    No data available       Home Medications    Prior to Admission medications   Medication Sig Start Date End Date Taking? Authorizing Provider  aspirin EC 325 MG tablet Take 325 mg by mouth daily.    [provider]  calcium carbonate (OS-CAL) 600 MG TABS tablet Take 600 mg by mouth 2 (two) times daily with a meal.    [provider]  carvedilol (COREG) 6.25 MG tablet take 1 tablet by mouth twice a day with meals 06/12/17   [name], MD  chlorthalidone (HYGROTON) 25 MG tablet Take 1 tablet (25 mg total) by mouth daily. 05/20/17   [name], MD  cholecalciferol (VITAMIN D) 1000 units tablet Take 2,000 Units by mouth daily.    [provider]  clonazePAM Bobbye Charleston) 1 MG tablet 1 qam 06/18/17   Plovsky, Berneta Sages, MD  fluocinonide-emollient (LIDEX-E) 0.05 % cream Apply 1 application topically 2 (two) times daily. 06/12/17   [name], MD  hydrALAZINE (APRESOLINE) 50 MG tablet Take 1 tablet (50 mg total) by mouth 3 (three) times daily. 09/10/16   [name], MD  hydrOXYzine (ATARAX/VISTARIL) 10 MG tablet Take 1 tablet (10 mg total) by mouth 2 (two) times daily with a meal. At lunch and dinner 07/03/17   Norma Fredrickson, MD  hydrOXYzine (VISTARIL) 25 MG capsule 1 at noon  And 1  At dinner 06/18/17   Plovsky, Berneta Sages, MD  isosorbide mononitrate (ISMO,MONOKET) 20 MG tablet Take 1 tablet (20 mg total) by mouth 2 (two) times daily at 10 AM and 5 PM. 12/11/16   [name], MD  meclizine (ANTIVERT) 25 MG tablet take 1 tablet by mouth three times a day if needed for dizziness 05/20/17   [name], MD  MYRBETRIQ 25 MG TB24 tablet take 1  tablet by mouth once daily 07/15/17   [name], MD  omeprazole (PRILOSEC) 40 MG capsule Take 1 capsule (40 mg total) by mouth daily. Patient taking differently: Take 40 mg by mouth daily as needed (heartburn).  12/11/16   [name], MD  oxyCODONE-acetaminophen (PERCOCET) 10-325 MG tablet Take 1 tablet by mouth every 8 (eight) hours as needed for pain. 06/12/17   [name], MD  promethazine (PHENERGAN) 12.5 MG tablet Take 1 tablet (12.5 mg total) by mouth every 6 (six) hours as needed for nausea or vomiting. 05/19/17   [name], MD  temazepam (RESTORIL) 15 MG capsule Take 1 capsule (15 mg total) by mouth at bedtime as needed for sleep. 06/18/17   Plovsky, Berneta Sages, MD  tolterodine (DETROL LA) 2 MG 24 hr capsule Take 1 capsule (2 mg total) by mouth daily. 02/11/17   [name], MD    Family History Family History  Problem Relation Age of Onset  . Cancer Father        prostate cancer  . Heart disease Sister   . Heart disease Brother   . Alcohol abuse Other   . Hypertension Other   . Kidney disease Other   . Mental illness Other   . Emphysema Brother     Social History Social History  Substance Use Topics  . Smoking status: Never Smoker  . Smokeless tobacco: Never Used  . Alcohol use No     Allergies   Amlodipine; Enalapril; Losartan potassium; Morphine and related; and Metformin   Review of Systems Review of Systems  Constitutional: Negative for fatigue.  HENT: Negative for tinnitus.   Eyes: Negative for photophobia, pain and visual disturbance.  Respiratory: Negative for shortness of breath.   Cardiovascular: Negative for chest pain.  Gastrointestinal: Negative for nausea and vomiting.  Musculoskeletal: Positive for arthralgias and neck pain. Negative for back pain and gait problem.  Skin: Negative for wound.  Neurological: Positive for headaches. Negative for dizziness, weakness, light-headedness and numbness.  Psychiatric/Behavioral: Negative for  confusion and decreased concentration.     Physical Exam Updated Vital Signs BP (!) 144/79 (BP Location: Left Arm)   Pulse 73   Temp 98.4 F (36.9 C) (Oral)   Resp 18   Ht 5\' 2"  (1.575 m)   Wt 90.7 kg (200 lb)   SpO2 98%   BMI 36.58 kg/m   Physical Exam  Constitutional: She is oriented to person, place, and time. She appears well-developed and well-nourished.  HENT:  Head: Normocephalic. Head is without raccoon's eyes and without Battle's sign.  Right Ear: Tympanic membrane, external ear and ear canal normal. No hemotympanum.  Left Ear: Tympanic membrane, external ear and ear canal normal. No hemotympanum.  Nose: Nose normal. No nasal septal hematoma.  Mouth/Throat: Uvula is midline, oropharynx is clear and moist and mucous membranes are normal.  Mild R forehead contusion. No deformity.   Eyes: Pupils are equal, round, and reactive to light. Conjunctivae, EOM and lids are normal. Right eye exhibits no nystagmus. Left eye exhibits no nystagmus.  No visible hyphema noted  Neck: Normal range of motion. Neck supple.  Cardiovascular: Normal rate and regular rhythm.   Pulmonary/Chest: Effort normal and breath sounds normal.  Abdominal: Soft. There is no tenderness.  Musculoskeletal:       Right shoulder: She exhibits tenderness. She exhibits normal range of motion and no bony tenderness.       Left shoulder: She exhibits tenderness and bony tenderness. She exhibits normal range of motion.       Right hip: Normal.       Left hip: Normal.       Right knee: Normal.       Left knee: Normal.       Right ankle: Normal.       Left ankle: Normal.       Cervical back: She exhibits tenderness and bony tenderness. She exhibits normal range of motion.       Thoracic back: She exhibits no tenderness and no bony tenderness.       Lumbar back: She exhibits  no tenderness and no bony tenderness.  Neurological: She is alert and oriented to person, place, and time. She has normal strength and  normal reflexes. No cranial nerve deficit or sensory deficit. Coordination normal. GCS eye subscore is 4. GCS verbal subscore is 5. GCS motor subscore is 6.  Skin: Skin is warm and dry.  Psychiatric: She has a normal mood and affect.  Nursing note and vitals reviewed.    ED Treatments / Results   Procedures Procedures (including critical care time)  Medications Ordered in ED Medications  ondansetron (ZOFRAN-ODT) disintegrating tablet 4 mg (4 mg Oral Given 08/05/17 2224)     Initial Impression / Assessment and Plan / ED Course  I have reviewed the triage vital signs and the nursing notes.  Pertinent labs & imaging results that were available during my care of the patient were reviewed by me and considered in my medical decision making (see chart for details).     Patient seen and examined. Work-up initiated. Good story for mechanical fall. Dizziness is baseline. Do not feel more extensive work-up indicated at current time.   Vital signs reviewed and are as follows: BP (!) 144/79 (BP Location: Left Arm)   Pulse 73   Temp 98.4 F (36.9 C) (Oral)   Resp 18   Ht 5\' 2"  (1.575 m)   Wt 90.7 kg (200 lb)   SpO2 98%   BMI 36.58 kg/m   11:39 PM Imaging negative. Patient discussed with and seen by Dr. Leonette Monarch.   Patient was counseled on head injury precautions and symptoms that should indicate their return to the ED.  These include severe worsening headache, vision changes, confusion, loss of consciousness, trouble walking, nausea & vomiting, or weakness/tingling in extremities.     Final Clinical Impressions(s) / ED Diagnoses   Final diagnoses:  Contusion of head, unspecified part of head, initial encounter  Neck pain  Acute pain of left shoulder  Fall, initial encounter   Patient with injuries sustained after fall today. Head CT, cervical spine CT, shoulder x-rays are negative. Fall was mechanical while dressing herself. She has baseline dizziness.   New  Prescriptions Discharge Medication List as of 08/05/2017 11:12 PM       Carlisle Cater, PA-C 08/05/17 2341    Leonette Monarch Grayce Sessions, MD 08/06/17 0009

## 2017-08-20 ENCOUNTER — Ambulatory Visit (INDEPENDENT_AMBULATORY_CARE_PROVIDER_SITE_OTHER): Payer: Medicare HMO

## 2017-08-20 ENCOUNTER — Ambulatory Visit (INDEPENDENT_AMBULATORY_CARE_PROVIDER_SITE_OTHER): Payer: Medicare HMO | Admitting: Family Medicine

## 2017-08-20 ENCOUNTER — Encounter: Payer: Self-pay | Admitting: Family Medicine

## 2017-08-20 VITALS — BP 126/84 | HR 82 | Temp 98.3°F | Ht 62.0 in

## 2017-08-20 DIAGNOSIS — M1612 Unilateral primary osteoarthritis, left hip: Secondary | ICD-10-CM | POA: Diagnosis not present

## 2017-08-20 DIAGNOSIS — M5442 Lumbago with sciatica, left side: Secondary | ICD-10-CM

## 2017-08-20 DIAGNOSIS — M25552 Pain in left hip: Secondary | ICD-10-CM

## 2017-08-20 DIAGNOSIS — M47816 Spondylosis without myelopathy or radiculopathy, lumbar region: Secondary | ICD-10-CM | POA: Diagnosis not present

## 2017-08-20 NOTE — Progress Notes (Signed)
[name] is a 83 y.o. female here for an acute visit.  History of Present Illness:   Water quality scientist, CMA, acting as scribe for Dr. Juleen China.  HPI:  Patient comes in today to discuss lower lest back and left hip pain that she has been having since a fall that she had on 2 weeks ago.  The pain radiates down her left leg to her knee.  She was seen in the ER after her fall and, her head, neck, and shoulder were examined and she was sent home.  It is very painful for her to stand and move around.  She dose have Tylenol and Percocet to use for prn pain. No weakness or incontinence. No new fall.   PMHx, SurgHx, SocialHx, Medications, and Allergies were reviewed in the Visit Navigator and updated as appropriate.  Current Medications:   .  aspirin EC 325 MG tablet, Take 325 mg by mouth daily., Disp: , Rfl:  .  calcium carbonate (OS-CAL) 600 MG TABS tablet, Take 600 mg by mouth 2 (two) times daily with a meal., Disp: , Rfl:  .  carvedilol (COREG) 6.25 MG tablet, take 1 tablet by mouth twice a day with meals, Disp: 60 tablet, Rfl: 11 .  chlorthalidone (HYGROTON) 25 MG tablet, Take 1 tablet (25 mg total) by mouth daily., Disp: 90 tablet, Rfl: 1 .  cholecalciferol (VITAMIN D) 1000 units tablet, Take 2,000 Units by mouth daily., Disp: , Rfl:  .  clonazePAM (KLONOPIN) 1 MG tablet, 1 qam, Disp: 30 tablet, Rfl: 4 .  fluocinonide-emollient (LIDEX-E) 0.05 % cream, Apply 1 application topically 2 (two) times daily., Disp: 30 g, Rfl: 0 .  hydrALAZINE (APRESOLINE) 50 MG tablet, Take 1 tablet (50 mg total) by mouth 3 (three) times daily., Disp: 270 tablet, Rfl: 3 .  hydrOXYzine (ATARAX/VISTARIL) 10 MG tablet, Take 1 tablet (10 mg total) by mouth 2 (two) times daily with a meal. At lunch and dinner, Disp: 30 tablet, Rfl: 0 .  hydrOXYzine (VISTARIL) 25 MG capsule, 1 at noon  And 1  At dinner, Disp: 60 capsule, Rfl: 5 .  isosorbide mononitrate (ISMO,MONOKET) 20 MG tablet, Take 1 tablet (20 mg total) by mouth 2 (two)  times daily at 10 AM and 5 PM., Disp: 180 tablet, Rfl: 3 .  meclizine (ANTIVERT) 25 MG tablet, take 1 tablet by mouth three times a day if needed for dizziness, Disp: 60 tablet, Rfl: 2 .  MYRBETRIQ 25 MG TB24 tablet, take 1 tablet by mouth once daily, Disp: 30 tablet, Rfl: 8 .  omeprazole (PRILOSEC) 40 MG capsule, Take 1 capsule (40 mg total) by mouth daily. (Patient taking differently: Take 40 mg by mouth daily as needed (heartburn). ), Disp: 90 capsule, Rfl: 3 .  oxyCODONE-acetaminophen (PERCOCET) 10-325 MG tablet, Take 1 tablet by mouth every 8 (eight) hours as needed for pain., Disp: 90 tablet, Rfl: 0 .  promethazine (PHENERGAN) 12.5 MG tablet, Take 1 tablet (12.5 mg total) by mouth every 6 (six) hours as needed for nausea or vomiting., Disp: 20 tablet, Rfl: 0 .  temazepam (RESTORIL) 15 MG capsule, Take 1 capsule (15 mg total) by mouth at bedtime as needed for sleep., Disp: 30 capsule, Rfl: 4 .  tolterodine (DETROL LA) 2 MG 24 hr capsule, Take 1 capsule (2 mg total) by mouth daily., Disp: 90 capsule, Rfl: 1   Allergies  Allergen Reactions  . Amlodipine Other (See Comments)    Reaction:  Dizziness   . Enalapril Cough  .  Losartan Potassium Other (See Comments)    Reaction:  GI pain   . Morphine And Related Itching and Nausea And Vomiting  . Metformin Diarrhea   Review of Systems:   Pertinent items are noted in the HPI. Otherwise, ROS is negative.  Vitals:   Vitals:   08/20/17 1331  BP: 126/84  Pulse: 82  Temp: 98.3 F (36.8 C)  TempSrc: Oral  SpO2: 93%  Height: 5\' 2"  (1.575 m)     There is no height or weight on file to calculate BMI.   Physical Exam:   Physical Exam  Constitutional: She appears well-nourished.  HENT:  Head: Normocephalic and atraumatic.  Eyes: Pupils are equal, round, and reactive to light. EOM are normal.  Neck: Normal range of motion. Neck supple.  Cardiovascular: Normal rate, regular rhythm, normal heart sounds and intact distal pulses.     Pulmonary/Chest: Effort normal.  Abdominal: Soft.  Musculoskeletal:       Left hip: She exhibits bony tenderness. She exhibits normal range of motion and normal strength.       Lumbar back: She exhibits tenderness and spasm. She exhibits no bony tenderness.  Skin: Skin is warm.  Psychiatric: She has a normal mood and affect. Her behavior is normal.  Nursing note and vitals reviewed.  EXAM: DG HIP (WITH OR WITHOUT PELVIS) 2-3V LEFT  COMPARISON:  None.  FINDINGS: No fracture, bone lesion or dislocation.  Mild medial left hip joint space narrowing. Marginal osteophytes project from the bases of both femoral heads and the superolateral acetabula.  The SI joints and symphysis pubis are normally spaced and aligned.  Soft tissues are unremarkable.  IMPRESSION: 1. No fracture or dislocation. 2. Arthropathic changes of both hip joints.  EXAM: LUMBAR SPINE - 2-3 VIEW  COMPARISON:  Lumbar MRI, 09/17/2014  FINDINGS: There is a transitional lumbosacral vertebrae, designated S1 for the purposes of this dictation and to be consistent with the prior MRI.  No fracture. Grade 1 anterolisthesis of L4 on L5 and of L5 on S1. No other spondylolisthesis.  Mild loss of disc height at L4-L5 and L5-S1. Remaining disc spaces are well preserved.  Facet degenerative changes evident in the lower lumbar spine.  No bone lesion.  There are dense calcifications in a normal caliber abdominal aorta.  IMPRESSION: 1. No fracture, bone lesion or acute finding. 2. Degenerative changes as described similar to the prior lumbar spine MRI.  Assessment and Plan:   [name] was seen today for acute visit.  Diagnoses and all orders for this visit:  Left hip pain Comments: Xray without acute findings. PT ordered. Fall precuations reviewed. Orders: -     DG HIP UNILAT W OR W/O PELVIS 2-3 VIEWS LEFT; Future -     Ambulatory referral to Physical Therapy  Acute left-sided low back pain  with left-sided sciatica -     DG Lumbar Spine 2-3 Views; Future -     Ambulatory referral to Physical Therapy   . Reviewed expectations re: course of current medical issues. . Discussed self-management of symptoms. . Outlined signs and symptoms indicating need for more acute intervention. . Patient verbalized understanding and all questions were answered. Marland Kitchen Health Maintenance issues including appropriate healthy diet, exercise, and smoking avoidance were discussed with patient. . See orders for this visit as documented in the electronic medical record. . Patient received an After Visit Summary.  CMA served as Education administrator during this visit. History, Physical, and Plan performed by medical provider. The above documentation has  been reviewed and is accurate and complete. Briscoe Deutscher, D.O.  Briscoe Deutscher, DO [place], [place] 08/24/2017  Future Appointments Date Time Provider [place]  08/29/2017 11:00 AM [facility] THERAPIST A [facility] None  09/17/2017 2:00 PM Norma Fredrickson, MD [facility] None  10/27/2017 1:00 PM [name], MD [facility]

## 2017-08-20 NOTE — Patient Instructions (Signed)
Schedule appointment for physical therapy.

## 2017-08-25 ENCOUNTER — Telehealth: Payer: Self-pay | Admitting: Family Medicine

## 2017-08-25 NOTE — Telephone Encounter (Signed)
MEDICATION: VIMOVO  PHARMACY:   [id number], [place] [address] [phone] (Phone) [phone] (Fax)   IS THIS A 90 DAY SUPPLY : no   IS PATIENT OUT OF MEDICATION: yes  IF NOT; HOW MUCH IS LEFT: n/a  LAST APPOINTMENT DATE: @9 /26/2018  NEXT APPOINTMENT DATE:n/a  OTHER COMMENTS: Patient only received 1 sample at her last visit.   **Let patient know to contact pharmacy at the end of the day to make sure medication is ready. **  ** Please notify patient to allow 48-72 hours to process**  **Encourage patient to contact the pharmacy for refills or they can request refills through [facility]**

## 2017-08-25 NOTE — Telephone Encounter (Signed)
Ok to send in Rx for this?  It will require prior auth.

## 2017-08-26 ENCOUNTER — Other Ambulatory Visit: Payer: Self-pay

## 2017-08-26 MED ORDER — NAPROXEN 500 MG PO TABS: 500.0000 mg | ORAL_TABLET | Freq: Every day | ORAL | 0 refills | Status: DC

## 2017-08-26 NOTE — Telephone Encounter (Signed)
Please call in pepcid and naproxen. Warn patient to use the naproxen sparingly.

## 2017-08-26 NOTE — Telephone Encounter (Signed)
Please document clearly that we provided this sample at the last visit. I did a poor job of this. This medication is simply Naproxen with a PPI. She will need to ask her PCP if this is okay to continue. I gave it to help acutely.

## 2017-08-26 NOTE — Telephone Encounter (Signed)
Left voicemail (patient identified self) urging patient to contact her PCP for medication advice.  Dr. Juleen China only saw this patient for an acute issue.

## 2017-08-26 NOTE — Telephone Encounter (Signed)
Patient called back and stated that Dr. Ronnald Ramp would not be back until Oct. 15. I advised the patient of the note below. Patient is requesting a call from Dr. Alcario Drought clinical team to discuss further due to Dr. Jenny Reichmann being out and have never prescribing the medication before. Call patient on her home phone to advise at [phone].

## 2017-08-26 NOTE — Telephone Encounter (Signed)
Please advise 

## 2017-08-26 NOTE — Telephone Encounter (Signed)
Spoke with patient.  She states she already takes omeprazole.  Sent Naproxen to her pharmacy and advised her to take it sparingly and with omeprazole.  Patient verbalized understanding.

## 2017-08-26 NOTE — Progress Notes (Signed)
Vimovo 500 mg/ 20 mg one tablet BID, #6, 0 refills (sample medication) was provided to the patient today for acute left hip pain.

## 2017-08-29 ENCOUNTER — Ambulatory Visit: Payer: Medicare HMO

## 2017-09-17 ENCOUNTER — Encounter (HOSPITAL_COMMUNITY): Payer: Self-pay | Admitting: Psychiatry

## 2017-09-17 ENCOUNTER — Ambulatory Visit (INDEPENDENT_AMBULATORY_CARE_PROVIDER_SITE_OTHER): Payer: Medicare HMO | Admitting: Psychiatry

## 2017-09-17 VITALS — BP 169/83 | HR 76 | Ht 62.0 in | Wt 211.4 lb

## 2017-09-17 DIAGNOSIS — Z811 Family history of alcohol abuse and dependence: Secondary | ICD-10-CM | POA: Diagnosis not present

## 2017-09-17 DIAGNOSIS — F4323 Adjustment disorder with mixed anxiety and depressed mood: Secondary | ICD-10-CM

## 2017-09-17 DIAGNOSIS — F419 Anxiety disorder, unspecified: Secondary | ICD-10-CM | POA: Diagnosis not present

## 2017-09-17 DIAGNOSIS — Z818 Family history of other mental and behavioral disorders: Secondary | ICD-10-CM

## 2017-09-17 MED ORDER — DULOXETINE HCL 30 MG PO CPEP: ORAL_CAPSULE | ORAL | 5 refills | Status: DC

## 2017-09-17 MED ORDER — TEMAZEPAM 15 MG PO CAPS: 15.0000 mg | ORAL_CAPSULE | Freq: Every evening | ORAL | 4 refills | Status: DC | PRN

## 2017-09-17 MED ORDER — CLONAZEPAM 0.5 MG PO TABS: ORAL_TABLET | ORAL | 3 refills | Status: DC

## 2017-09-17 NOTE — Progress Notes (Signed)
Psychiatric Initial Adult Assessment   Patient Identification: [name] MRN:  [id number] Date of Evaluation:  09/17/2017 Referral Source: Dr. Scarlette Calico Chief Complaint:   Visit Diagnosis: No diagnosis found.  History of Present Illness:   Today the patient is doing fairly well. She reports that she could not take a Vistaril made her feel sick. She continues to take Klonopin 1 mg in the morning only. She takes Percocet one or 2 a day when necessary. Today she comes requesting Cymbalta that she's been on in the past. I actually think this is a good idea. She somehow thinks it'll help her anxiety Once again I assist that we begin reducing the Klonopin further. On her last visit we reduced from 1 mg twice a day just taking 1 mg in the morning. Today regarding to reduce his 0.5 mg every morning. Today we'll go ahead and give her some Cymbalta. The patient says she sleeping and eating well. A significant stress was the death of her niece who died of cancer and metastatic disease to the brain. Overall though the patient has good energy. She is functioning fairly well. Although it should be noted that she's had one fall since our last visit.This is further evidence that she should coming off of benzodiazepines. I will have no trouble giving her Restoril at night but will go ahead and try to get her off Klonopin. She believes that perhaps with his Cymbalta she will feel all that anxious. The possibility of using Neurontin also be considered as replacement for Klonopin the future. The patient is not suicidal. She doesn't drink any alcohol. She denies any chest pain shortness of breath or any neurological symptoms Associated Signs/Symptoms: Depression Symptoms:  anxiety, (Hypo) Manic Symptoms:   Anxiety Symptoms:   Psychotic Symptoms:   PTSD Symptoms:   Past Psychiatric History  Previous Psychotropic Medications: Klonopin 1 mg twice a day, trazodone 200 mg   Substance Abuse History in the last 12  months:    Consequences of Substance Abuse:   Past Medical History:  Past Medical History:  Diagnosis Date  . Abdominal pain   . Anemia    NOS  . Anxiety   . Back pain   . Bruises easily   . Chronic diastolic CHF (congestive heart failure) ([place])    a. 06/2013 EF 65-70%.  . Chronic headaches    HISTORY OF   . Depression   . Diabetes mellitus    type II  DIET CONTROLLED  . Diarrhea   . Hepatitis    AGE 30S  . Hyperlipidemia   . Hypertension   . Mild aortic stenosis    a. 06/2013 Echo: EF 65-70%, mod LVH with focal basal hypertrophy, very mild AS.  Marland Kitchen Osteoarthritis   . Oxygen desaturation during sleep    USES 2 LITERS BEDTIME   VIA CPAP 06/2012 [facility]   . Shortness of breath    WITH EXERTION USES 2 L O2 BEDTIME  . Sleep apnea    CPAP WITH O2 2 LITERS 2013 ([facility])  . Wears dentures   . Wears glasses     Past Surgical History:  Procedure Laterality Date  . ABDOMINAL HYSTERECTOMY    . APPENDECTOMY    . CHOLECYSTECTOMY    . JOINT REPLACEMENT     LEFT KNEE   . KNEE ARTHROPLASTY Right 03/22/2015   Procedure: COMPUTER ASSISTED TOTAL KNEE ARTHROPLASTY;  Surgeon: Marybelle Killings, MD;  Location: [facility];  Service: Orthopedics;  Laterality: Right;  .  KNEE ARTHROSCOPY     RIGHT  . LEFT AND RIGHT HEART CATHETERIZATION WITH CORONARY ANGIOGRAM N/A 10/28/2013   Procedure: LEFT AND RIGHT HEART CATHETERIZATION WITH CORONARY ANGIOGRAM;  Surgeon: Blane Ohara, MD;  Location: [facility] CATH LAB;  Service: Cardiovascular;  Laterality: N/A;  . LEFT HEART CATHETERIZATION WITH CORONARY ANGIOGRAM N/A 11/14/2014   Procedure: LEFT HEART CATHETERIZATION WITH CORONARY ANGIOGRAM;  Surgeon: Josue Hector, MD;  Location: [facility] CATH LAB;  Service: Cardiovascular;  Laterality: N/A;  . REVISION TOTAL KNEE ARTHROPLASTY  2011  . ROTATOR CUFF REPAIR  2010  . SHOULDER ARTHROSCOPY  09/17/2012   Procedure: ARTHROSCOPY SHOULDER;  Surgeon: Sharmon Revere, MD;  Location: [facility];  Service: Orthopedics;  Laterality:  Left;  . TONSILLECTOMY    . TONSILLECTOMY      Family Psychiatric History:   Family History:  Family History  Problem Relation Age of Onset  . Cancer Father        prostate cancer  . Heart disease Sister   . Heart disease Brother   . Alcohol abuse Other   . Hypertension Other   . Kidney disease Other   . Mental illness Other   . Emphysema Brother     Social History:   Social History   Social History  . Marital status: Widowed    Spouse name: N/A  . Number of children: 5  . Years of education: N/A   Occupational History  . retired  Retired    Museum/gallery curator at [employer] Topics  . Smoking status: Never Smoker  . Smokeless tobacco: Never Used  . Alcohol use No  . Drug use: No  . Sexual activity: No   Other Topics Concern  . None   Social History Narrative  . None    Additional Social History:   Allergies:   Allergies  Allergen Reactions  . Amlodipine Other (See Comments)    Reaction:  Dizziness   . Enalapril Cough  . Losartan Potassium Other (See Comments)    Reaction:  GI pain   . Morphine And Related Itching and Nausea And Vomiting  . Metformin Diarrhea    Metabolic Disorder Labs: Lab Results  Component Value Date   HGBA1C 5.0 05/16/2015   No results found for: PROLACTIN Lab Results  Component Value Date   CHOL 229 (H) 06/12/2017   TRIG 233.0 (H) 06/12/2017   HDL 63.30 06/12/2017   CHOLHDL 4 06/12/2017   VLDL 46.6 (H) 06/12/2017   LDLCALC 106 (H) 05/16/2015   LDLCALC 116 (H) 05/25/2014     Current Medications: Current Outpatient Prescriptions  Medication Sig Dispense Refill  . aspirin EC 325 MG tablet Take 325 mg by mouth daily.    . calcium carbonate (OS-CAL) 600 MG TABS tablet Take 600 mg by mouth 2 (two) times daily with a meal.    . carvedilol (COREG) 6.25 MG tablet take 1 tablet by mouth twice a day with meals 60 tablet 11  . chlorthalidone (HYGROTON) 25 MG tablet Take 1 tablet (25 mg total) by mouth daily. 90  tablet 1  . cholecalciferol (VITAMIN D) 1000 units tablet Take 2,000 Units by mouth daily.    . clonazePAM (KLONOPIN) 0.5 MG tablet 1 qam 30 tablet 3  . DULoxetine (CYMBALTA) 30 MG capsule 1  qam  For  1 week then 2  qam 60 capsule 5  . fluocinonide-emollient (LIDEX-E) 0.05 % cream Apply 1 application topically 2 (two) times daily. 30 g 0  .  hydrALAZINE (APRESOLINE) 50 MG tablet Take 1 tablet (50 mg total) by mouth 3 (three) times daily. 270 tablet 3  . hydrOXYzine (ATARAX/VISTARIL) 10 MG tablet Take 1 tablet (10 mg total) by mouth 2 (two) times daily with a meal. At lunch and dinner 30 tablet 0  . hydrOXYzine (VISTARIL) 25 MG capsule 1 at noon  And 1  At dinner 60 capsule 5  . isosorbide mononitrate (ISMO,MONOKET) 20 MG tablet Take 1 tablet (20 mg total) by mouth 2 (two) times daily at 10 AM and 5 PM. 180 tablet 3  . meclizine (ANTIVERT) 25 MG tablet take 1 tablet by mouth three times a day if needed for dizziness 60 tablet 2  . MYRBETRIQ 25 MG TB24 tablet take 1 tablet by mouth once daily 30 tablet 8  . naproxen (NAPROSYN) 500 MG tablet Take 1 tablet (500 mg total) by mouth daily. Take with omeprazole. 30 tablet 0  . omeprazole (PRILOSEC) 40 MG capsule Take 1 capsule (40 mg total) by mouth daily. (Patient taking differently: Take 40 mg by mouth daily as needed (heartburn). ) 90 capsule 3  . oxyCODONE-acetaminophen (PERCOCET) 10-325 MG tablet Take 1 tablet by mouth every 8 (eight) hours as needed for pain. 90 tablet 0  . promethazine (PHENERGAN) 12.5 MG tablet Take 1 tablet (12.5 mg total) by mouth every 6 (six) hours as needed for nausea or vomiting. 20 tablet 0  . temazepam (RESTORIL) 15 MG capsule Take 1 capsule (15 mg total) by mouth at bedtime as needed for sleep. 30 capsule 4  . tolterodine (DETROL LA) 2 MG 24 hr capsule Take 1 capsule (2 mg total) by mouth daily. 90 capsule 1   No current facility-administered medications for this visit.     Neurologic: Headache: No Seizure:  No Paresthesias:No  Musculoskeletal: Strength & Muscle Tone: decreased Gait & Station: unsteady Patient leans: N/A  Psychiatric Specialty Exam: ROS  Blood pressure (!) 169/83, pulse 76, height 5\' 2"  (1.575 m), weight 211 lb 6.4 oz (95.9 kg).Body mass index is 38.67 kg/m.  General Appearance: Casual  Eye Contact:  Good  Speech:  Clear and Coherent  Volume:  Normal  Mood:  Euthymic  Affect:  Blunt  Thought Process: Normal   Orientation:  NA  Thought Content:  Logical  Suicidal Thoughts:  No  Homicidal Thoughts:  No  Memory:  NA  Judgement:  Fair  Insight:  Lacking  Psychomotor Activity:  Normal  Concentration:    Recall:  [place] of Knowledge:Fair  Language: Fair  Akathisia:  No  Handed:  Right  AIMS (if indicated):    Assets:  Desire for Improvement  ADL's:  Intact  Cognition:Normal   Sleep:     Treatment Plan Summary: At this time we will release the Klonopin 0.5 mg in the morning. She will start on Cymbalta in the morning and titrate up to the dose she was on previously to 60 mg. It is my hope that realistically she'll use less Percocet. She gets Percocet from her primary care doctor. Today once again we gave her the warning taking a benzodiazepine and opiate is no longer acceptable particularly in her age group. It should be noted that her Klonopin was only started one or 2 years ago. She noted that he repeated evaluations the patient has no clear evidence of an anxiety disorder. There for this patient is not been on Klonopin for decades. She's only been on for a year or so. My goal be to replace  the Klonopin with a different antianxiety medicine which could includeeither Neurontin or perhaps BuSpar. The patient continue taking Restoril to help her sleep. This patient to return to see me in 3 months.   Jerral Ralph, MD 10/24/20182:27 PM

## 2017-09-26 ENCOUNTER — Telehealth: Payer: Self-pay | Admitting: Internal Medicine

## 2017-09-26 NOTE — Telephone Encounter (Signed)
Check [place] registry last filled 09/04/2017...[name]

## 2017-09-26 NOTE — Telephone Encounter (Signed)
Patient requesting refill on oxycodone °

## 2017-09-28 ENCOUNTER — Other Ambulatory Visit: Payer: Self-pay | Admitting: Internal Medicine

## 2017-09-28 DIAGNOSIS — M1711 Unilateral primary osteoarthritis, right knee: Secondary | ICD-10-CM

## 2017-09-28 DIAGNOSIS — M47816 Spondylosis without myelopathy or radiculopathy, lumbar region: Secondary | ICD-10-CM

## 2017-09-28 DIAGNOSIS — M5416 Radiculopathy, lumbar region: Secondary | ICD-10-CM

## 2017-09-28 MED ORDER — OXYCODONE-ACETAMINOPHEN 10-325 MG PO TABS: 1.0000 | ORAL_TABLET | Freq: Three times a day (TID) | ORAL | 0 refills | Status: DC | PRN

## 2017-09-28 NOTE — Telephone Encounter (Signed)
done

## 2017-09-29 NOTE — Telephone Encounter (Signed)
Notified pt rx ready for pick-up.../lmb 

## 2017-10-06 ENCOUNTER — Telehealth: Payer: Self-pay

## 2017-10-06 ENCOUNTER — Other Ambulatory Visit: Payer: Self-pay | Admitting: Internal Medicine

## 2017-10-06 DIAGNOSIS — I10 Essential (primary) hypertension: Secondary | ICD-10-CM

## 2017-10-06 DIAGNOSIS — I350 Nonrheumatic aortic (valve) stenosis: Secondary | ICD-10-CM

## 2017-10-06 MED ORDER — HYDRALAZINE HCL 50 MG PO TABS: 50.0000 mg | ORAL_TABLET | Freq: Three times a day (TID) | ORAL | 1 refills | Status: DC

## 2017-10-06 NOTE — Telephone Encounter (Signed)
rx request for hydralazine. LOV 05/2017.   Please advise if refill is okay.

## 2017-10-21 ENCOUNTER — Other Ambulatory Visit: Payer: Self-pay | Admitting: Internal Medicine

## 2017-10-21 ENCOUNTER — Other Ambulatory Visit: Payer: Self-pay | Admitting: *Deleted

## 2017-10-21 MED ORDER — MECLIZINE HCL 25 MG PO TABS: ORAL_TABLET | ORAL | 2 refills | Status: DC

## 2017-10-21 NOTE — Telephone Encounter (Signed)
Refill authorized per protocol. Patient has appointment 10/27/2017

## 2017-10-21 NOTE — Telephone Encounter (Signed)
Copied from [place] [id number]. Topic: Quick Communication - See Telephone Encounter >> Oct 21, 2017  9:59 AM Aurelio Brash B wrote: CRM for notification. See Telephone encounter for:  Refill meclizine  Pt is out 10/21/17. [facility]

## 2017-10-27 ENCOUNTER — Other Ambulatory Visit (INDEPENDENT_AMBULATORY_CARE_PROVIDER_SITE_OTHER): Payer: Medicare HMO

## 2017-10-27 ENCOUNTER — Telehealth: Payer: Self-pay | Admitting: Internal Medicine

## 2017-10-27 ENCOUNTER — Encounter: Payer: Self-pay | Admitting: Internal Medicine

## 2017-10-27 ENCOUNTER — Ambulatory Visit: Payer: Medicare HMO | Admitting: Internal Medicine

## 2017-10-27 VITALS — BP 130/88 | HR 73 | Temp 98.3°F | Resp 16 | Ht 62.0 in | Wt 206.0 lb

## 2017-10-27 DIAGNOSIS — M5416 Radiculopathy, lumbar region: Secondary | ICD-10-CM | POA: Diagnosis not present

## 2017-10-27 DIAGNOSIS — I10 Essential (primary) hypertension: Secondary | ICD-10-CM

## 2017-10-27 DIAGNOSIS — M47816 Spondylosis without myelopathy or radiculopathy, lumbar region: Secondary | ICD-10-CM

## 2017-10-27 DIAGNOSIS — M1711 Unilateral primary osteoarthritis, right knee: Secondary | ICD-10-CM | POA: Diagnosis not present

## 2017-10-27 DIAGNOSIS — F339 Major depressive disorder, recurrent, unspecified: Secondary | ICD-10-CM | POA: Diagnosis not present

## 2017-10-27 DIAGNOSIS — R739 Hyperglycemia, unspecified: Secondary | ICD-10-CM | POA: Diagnosis not present

## 2017-10-27 LAB — BASIC METABOLIC PANEL
BUN: 11 mg/dL (ref 6–23)
CHLORIDE: 100 meq/L (ref 96–112)
CO2: 30 meq/L (ref 19–32)
CREATININE: 0.56 mg/dL (ref 0.40–1.20)
Calcium: 10.2 mg/dL (ref 8.4–10.5)
GFR: 133.06 mL/min (ref >60.00)
GLUCOSE: 130 mg/dL — AB (ref 70–99)
Potassium: 3.8 mEq/L (ref 3.5–5.1)
Sodium: 140 mEq/L (ref 135–145)

## 2017-10-27 LAB — HEMOGLOBIN A1C: Hgb A1c MFr Bld: 5.3 % (ref 4.6–6.5)

## 2017-10-27 MED ORDER — OXYCODONE-ACETAMINOPHEN 10-325 MG PO TABS: 1.0000 | ORAL_TABLET | Freq: Three times a day (TID) | ORAL | 0 refills | Status: DC | PRN

## 2017-10-27 MED ORDER — TRAZODONE HCL 100 MG PO TABS: 100.0000 mg | ORAL_TABLET | Freq: Every day | ORAL | 1 refills | Status: DC

## 2017-10-27 NOTE — Telephone Encounter (Signed)
Copied from [place]. Topic: Quick Communication - See Telephone Encounter >> Oct 27, 2017  3:17 PM Boyd Kerbs wrote: CRM for notification. See Telephone encounter for:   Patient says went to pharmacy and no refill.   She just saw Dr. Ronnald Ramp today and is having her take 2 a day.  Prescription Clonazepam.  The prescription [facility] showed is one a day and said they could not refill yet.  Will need to have a different prescription called in.  She is needing this today 10/27/17.

## 2017-10-27 NOTE — Patient Instructions (Signed)

## 2017-10-27 NOTE — Progress Notes (Signed)
Subjective:  Patient ID: [name], female    DOB: [birth year]  Age: 83 y.o. MRN: [id number]  CC: Osteoarthritis; Hypertension; Back Pain; and Depression   HPI [name] presents for f/up - She complains of pain, anxiety, and insomnia.  She is upset that her psychiatrist recently changed her clonazepam dose.  I can't understand what she is concerned about with respect to the clonazepam dose.  She is also upset with her psychiatrist that he change the trazodone to temazepam.  She tells me she does not want to take temazepam she wants to take trazodone for insomnia.  She also complains of chronic pain and wants a refill on Percocet.  Outpatient Medications Prior to Visit  Medication Sig Dispense Refill  . calcium carbonate (OS-CAL) 600 MG TABS tablet Take 600 mg by mouth 2 (two) times daily with a meal.    . carvedilol (COREG) 6.25 MG tablet take 1 tablet by mouth twice a day with meals 60 tablet 11  . chlorthalidone (HYGROTON) 25 MG tablet Take 1 tablet (25 mg total) by mouth daily. 90 tablet 1  . cholecalciferol (VITAMIN D) 1000 units tablet Take 2,000 Units by mouth daily.    . clonazePAM (KLONOPIN) 1 MG tablet   0  . DULoxetine (CYMBALTA) 30 MG capsule 1  qam  For  1 week then 2  qam 60 capsule 5  . hydrALAZINE (APRESOLINE) 50 MG tablet Take 1 tablet (50 mg total) 3 (three) times daily by mouth. 270 tablet 1  . isosorbide mononitrate (ISMO,MONOKET) 20 MG tablet Take 1 tablet (20 mg total) by mouth 2 (two) times daily at 10 AM and 5 PM. 180 tablet 3  . meclizine (ANTIVERT) 25 MG tablet take 1 tablet by mouth three times a day if needed for dizziness 60 tablet 2  . MYRBETRIQ 25 MG TB24 tablet take 1 tablet by mouth once daily 30 tablet 8  . omeprazole (PRILOSEC) 40 MG capsule Take 1 capsule (40 mg total) by mouth daily. (Patient taking differently: Take 40 mg by mouth daily as needed (heartburn). ) 90 capsule 3  . aspirin EC 325 MG tablet Take 325 mg by mouth daily.    .  fluocinonide-emollient (LIDEX-E) 0.05 % cream Apply 1 application topically 2 (two) times daily. 30 g 0  . hydrOXYzine (ATARAX/VISTARIL) 10 MG tablet Take 1 tablet (10 mg total) by mouth 2 (two) times daily with a meal. At lunch and dinner 30 tablet 0  . hydrOXYzine (VISTARIL) 25 MG capsule 1 at noon  And 1  At dinner 60 capsule 5  . naproxen (NAPROSYN) 500 MG tablet Take 1 tablet (500 mg total) by mouth daily. Take with omeprazole. 30 tablet 0  . oxyCODONE-acetaminophen (PERCOCET) 10-325 MG tablet Take 1 tablet every 8 (eight) hours as needed by mouth for pain. 90 tablet 0  . promethazine (PHENERGAN) 12.5 MG tablet Take 1 tablet (12.5 mg total) by mouth every 6 (six) hours as needed for nausea or vomiting. 20 tablet 0  . temazepam (RESTORIL) 15 MG capsule Take 1 capsule (15 mg total) by mouth at bedtime as needed for sleep. 30 capsule 4  . tolterodine (DETROL LA) 2 MG 24 hr capsule Take 1 capsule (2 mg total) by mouth daily. 90 capsule 1  . clonazePAM (KLONOPIN) 0.5 MG tablet 1 qam 30 tablet 3   No facility-administered medications prior to visit.     ROS Review of Systems  Constitutional: Negative for appetite change, diaphoresis, fatigue and  unexpected weight change.  HENT: Negative.   Eyes: Negative.  Negative for visual disturbance.  Respiratory: Negative.  Negative for cough, chest tightness, shortness of breath and wheezing.   Cardiovascular: Negative.  Negative for chest pain, palpitations and leg swelling.  Gastrointestinal: Negative for abdominal pain, constipation, diarrhea, nausea and vomiting.  Endocrine: Negative.   Genitourinary: Negative.  Negative for difficulty urinating.  Musculoskeletal: Positive for arthralgias and back pain. Negative for myalgias and neck pain.  Skin: Negative.   Allergic/Immunologic: Negative.   Neurological: Negative.  Negative for dizziness, weakness and headaches.  Hematological: Negative for adenopathy. Does not bruise/bleed easily.    Psychiatric/Behavioral: Positive for agitation, confusion, dysphoric mood and sleep disturbance. Negative for behavioral problems, decreased concentration, hallucinations, self-injury and suicidal ideas. The patient is nervous/anxious. The patient is not hyperactive.     Objective:  BP 130/88 (BP Location: Left Arm, Patient Position: Sitting, Cuff Size: Large)   Pulse 73   Temp 98.3 F (36.8 C) (Oral)   Resp 16   Ht 5\' 2"  (1.575 m)   Wt 206 lb (93.4 kg)   SpO2 98%   BMI 37.68 kg/m   BP Readings from Last 3 Encounters:  10/27/17 130/88  08/20/17 126/84  08/05/17 (!) 144/79    Wt Readings from Last 3 Encounters:  10/27/17 206 lb (93.4 kg)  08/05/17 200 lb (90.7 kg)  06/12/17 209 lb 8 oz (95 kg)    Physical Exam  Constitutional: She is oriented to person, place, and time. No distress.  HENT:  Mouth/Throat: Oropharynx is clear and moist. No oropharyngeal exudate.  Eyes: Conjunctivae are normal. Right eye exhibits no discharge. Left eye exhibits no discharge. No scleral icterus.  Neck: Normal range of motion. Neck supple. No JVD present. No thyromegaly present.  Cardiovascular: Normal rate, regular rhythm and normal heart sounds.  No murmur heard. Pulmonary/Chest: Effort normal and breath sounds normal. No respiratory distress. She has no wheezes. She has no rales. She exhibits no tenderness.  Abdominal: Soft. Bowel sounds are normal. She exhibits no distension and no mass. There is no tenderness. There is no rebound and no guarding.  Musculoskeletal: Normal range of motion. She exhibits no edema, tenderness or deformity.  Lymphadenopathy:    She has no cervical adenopathy.  Neurological: She is alert and oriented to person, place, and time.  Skin: Skin is warm and dry. No rash noted. She is not diaphoretic. No erythema. No pallor.  Psychiatric: Judgment normal. Her mood appears anxious. Her affect is angry. Her speech is not rapid and/or pressured, not delayed and not  tangential. She is agitated. She is not aggressive, not slowed, not withdrawn and not combative. Cognition and memory are normal. She exhibits a depressed mood. She expresses no homicidal and no suicidal ideation. She expresses no suicidal plans and no homicidal plans.  He is anxious and tearful today.  She is alert to person place time and situation but is very confused about her medications. She is attentive.  Vitals reviewed.   Lab Results  Component Value Date   WBC 10.7 (H) 02/03/2017   HGB 13.2 02/03/2017   HCT 40.2 02/03/2017   PLT 154 02/03/2017   GLUCOSE 130 (H) 10/27/2017   CHOL 229 (H) 06/12/2017   TRIG 233.0 (H) 06/12/2017   HDL 63.30 06/12/2017   LDLDIRECT 121.0 06/12/2017   LDLCALC 106 (H) 05/16/2015   ALT 24 02/03/2017   AST 23 02/03/2017   NA 140 10/27/2017   K 3.8 10/27/2017   CL  100 10/27/2017   CREATININE 0.56 10/27/2017   BUN 11 10/27/2017   CO2 30 10/27/2017   TSH 2.695 02/03/2017   INR 1.01 03/10/2015   HGBA1C 5.3 10/27/2017    Ct Head Wo Contrast  Result Date: 08/05/2017 CLINICAL DATA:  Patient fell hitting head numerous times while putting on pants today. Patient began having pain in her neck when turning to the left. Hematoma of the right forehead. EXAM: CT HEAD WITHOUT CONTRAST CT CERVICAL SPINE WITHOUT CONTRAST TECHNIQUE: Multidetector CT imaging of the head and cervical spine was performed following the standard protocol without intravenous contrast. Multiplanar CT image reconstructions of the cervical spine were also generated. COMPARISON:  Cervical spine radiographs from 11/15/2011, CT head from 10/26/2013, MRI of the head 05/19/2017. FINDINGS: CT HEAD FINDINGS Brain: Chronic tiny bilateral cerebellar infarcts. Mild superficial atrophy with mild chronic small vessel ischemic disease of deep white matter. No large vascular territory infarct. No intracranial hemorrhage, intra-axial mass nor extra-axial fluid collections. Chronic appearing small right  thalamic lacunar infarct. Mild superficial atrophy is noted. There is no hydrocephalus. Vascular: Moderate atherosclerosis of the carotid siphons. No hyperdense vessels. Skull: No acute skull fracture or suspicious osseous lesions. Sinuses/Orbits: No acute finding. Other: None. CT CERVICAL SPINE FINDINGS Alignment: Reversal cervical lordosis. Intact craniocervical relationship and atlantodental interval. Skull base and vertebrae: No skullbase fracture. No vertebral fracture or subluxation. Soft tissues and spinal canal: No prevertebral fluid or swelling. No visible canal hematoma. Disc levels: Mild-to-moderate disc space narrowing from C3 through C6 with small disc-osteophyte complexes. Uncovertebral joint osteoarthritis with spurring from C3-4 through C4-5 bilaterally and on the left at C5-6. These contribute to mild neural foraminal encroachment. Upper chest: Negative. Other: Thyromegaly is noted with calcified nodule near the thyroid isthmus. There is retroclavicular extension of the thyroid gland. IMPRESSION: 1. Superficial atrophy with chronic small vessel ischemic disease of periventricular white matter. 2. Chronic tiny bilateral cerebellar infarcts and right thalamic lacunar infarct. 3. No acute intracranial abnormality. 4. Cervical spondylosis without acute posttraumatic cervical spine fracture or subluxation. Electronically Signed   By: Ashley Royalty M.D.   On: 08/05/2017 22:33   Ct Cervical Spine Wo Contrast  Result Date: 08/05/2017 CLINICAL DATA:  Patient fell hitting head numerous times while putting on pants today. Patient began having pain in her neck when turning to the left. Hematoma of the right forehead. EXAM: CT HEAD WITHOUT CONTRAST CT CERVICAL SPINE WITHOUT CONTRAST TECHNIQUE: Multidetector CT imaging of the head and cervical spine was performed following the standard protocol without intravenous contrast. Multiplanar CT image reconstructions of the cervical spine were also generated.  COMPARISON:  Cervical spine radiographs from 11/15/2011, CT head from 10/26/2013, MRI of the head 05/19/2017. FINDINGS: CT HEAD FINDINGS Brain: Chronic tiny bilateral cerebellar infarcts. Mild superficial atrophy with mild chronic small vessel ischemic disease of deep white matter. No large vascular territory infarct. No intracranial hemorrhage, intra-axial mass nor extra-axial fluid collections. Chronic appearing small right thalamic lacunar infarct. Mild superficial atrophy is noted. There is no hydrocephalus. Vascular: Moderate atherosclerosis of the carotid siphons. No hyperdense vessels. Skull: No acute skull fracture or suspicious osseous lesions. Sinuses/Orbits: No acute finding. Other: None. CT CERVICAL SPINE FINDINGS Alignment: Reversal cervical lordosis. Intact craniocervical relationship and atlantodental interval. Skull base and vertebrae: No skullbase fracture. No vertebral fracture or subluxation. Soft tissues and spinal canal: No prevertebral fluid or swelling. No visible canal hematoma. Disc levels: Mild-to-moderate disc space narrowing from C3 through C6 with small disc-osteophyte complexes. Uncovertebral joint osteoarthritis with  spurring from C3-4 through C4-5 bilaterally and on the left at C5-6. These contribute to mild neural foraminal encroachment. Upper chest: Negative. Other: Thyromegaly is noted with calcified nodule near the thyroid isthmus. There is retroclavicular extension of the thyroid gland. IMPRESSION: 1. Superficial atrophy with chronic small vessel ischemic disease of periventricular white matter. 2. Chronic tiny bilateral cerebellar infarcts and right thalamic lacunar infarct. 3. No acute intracranial abnormality. 4. Cervical spondylosis without acute posttraumatic cervical spine fracture or subluxation. Electronically Signed   By: Ashley Royalty M.D.   On: 08/05/2017 22:33   Dg Shoulder Left  Result Date: 08/05/2017 CLINICAL DATA:  Left shoulder pain after fall this morning.  EXAM: LEFT SHOULDER - 2+ VIEW COMPARISON:  07/09/2012 radiographs.  MRI 07/28/2012. FINDINGS: Inferomedial humeral head spurring at the glenohumeral joint consistent with osteoarthritis. No acute fracture dislocation is seen. The included ribs and lung are nonacute. There is aortic atherosclerosis without aneurysm. IMPRESSION: 1. No acute fracture nor dislocation of the left shoulder. 2. Osteoarthritis of the left glenohumeral joint. Electronically Signed   By: Ashley Royalty M.D.   On: 08/05/2017 22:40    Assessment & Plan:   [name] was seen today for osteoarthritis, hypertension, back pain and depression.  Diagnoses and all orders for this visit:  Hyperglycemia- Improvement noted.  Her A1c is down to 5.3%. -     Basic metabolic panel; Future -     Hemoglobin A1c; Future  Essential hypertension- Her blood pressure is well controlled.  Electrolytes and renal function are normal. -     Basic metabolic panel; Future  Lumbar radiculopathy -     oxyCODONE-acetaminophen (PERCOCET) 10-325 MG tablet; Take 1 tablet by mouth every 8 (eight) hours as needed for pain.  Primary osteoarthritis of right knee -     oxyCODONE-acetaminophen (PERCOCET) 10-325 MG tablet; Take 1 tablet by mouth every 8 (eight) hours as needed for pain.  Lumbar spondylosis -     oxyCODONE-acetaminophen (PERCOCET) 10-325 MG tablet; Take 1 tablet by mouth every 8 (eight) hours as needed for pain.  Major depressive disorder, recurrent episode with anxious distress ([place])- Will continue Klonopin at the current dose.  Will change temazepam back to trazodone at her request. -     traZODone (DESYREL) 100 MG tablet; Take 1 tablet (100 mg total) by mouth at bedtime.   I have discontinued [name]'s aspirin EC, tolterodine, promethazine, fluocinonide-emollient, hydrOXYzine, hydrOXYzine, naproxen, and temazepam. I have also changed her oxyCODONE-acetaminophen. Additionally, I am having her start on traZODone. Lastly, I am having her  maintain her calcium carbonate, cholecalciferol, isosorbide mononitrate, omeprazole, chlorthalidone, carvedilol, MYRBETRIQ, DULoxetine, hydrALAZINE, meclizine, and clonazePAM.  Meds ordered this encounter  Medications  . oxyCODONE-acetaminophen (PERCOCET) 10-325 MG tablet    Sig: Take 1 tablet by mouth every 8 (eight) hours as needed for pain.    Dispense:  90 tablet    Refill:  0  . traZODone (DESYREL) 100 MG tablet    Sig: Take 1 tablet (100 mg total) by mouth at bedtime.    Dispense:  90 tablet    Refill:  1     Follow-up: Return in about 6 months (around 04/27/2018).  Scarlette Calico, MD

## 2017-10-27 NOTE — Telephone Encounter (Signed)
Pt was seen for OV today with Dr. Ronnald Ramp today. Pt states clonazepam was changed to take twice a day instead of once. The prescription [facility] shows for medication to be taken once a day and said they could not refill yet.

## 2017-10-28 NOTE — Telephone Encounter (Signed)
Called pt. Informed that she was getting the prescription from Dr. Casimiro Needle. Pt stated that she was advised to stop going to Dr. Casimiro Needle.

## 2017-10-30 ENCOUNTER — Emergency Department (HOSPITAL_COMMUNITY)
Admission: EM | Admit: 2017-10-30 | Discharge: 2017-10-30 | Disposition: A | Discharge status: Home / Self Care | Payer: Medicare HMO | Attending: Emergency Medicine | Admitting: Emergency Medicine

## 2017-10-30 ENCOUNTER — Encounter (HOSPITAL_COMMUNITY): Payer: Self-pay

## 2017-10-30 DIAGNOSIS — I110 Hypertensive heart disease with heart failure: Secondary | ICD-10-CM | POA: Diagnosis not present

## 2017-10-30 DIAGNOSIS — I5032 Chronic diastolic (congestive) heart failure: Secondary | ICD-10-CM | POA: Diagnosis not present

## 2017-10-30 DIAGNOSIS — R110 Nausea: Secondary | ICD-10-CM | POA: Diagnosis present

## 2017-10-30 DIAGNOSIS — F419 Anxiety disorder, unspecified: Secondary | ICD-10-CM | POA: Diagnosis not present

## 2017-10-30 DIAGNOSIS — R42 Dizziness and giddiness: Secondary | ICD-10-CM

## 2017-10-30 DIAGNOSIS — F0390 Unspecified dementia without behavioral disturbance: Secondary | ICD-10-CM | POA: Diagnosis not present

## 2017-10-30 DIAGNOSIS — Z9049 Acquired absence of other specified parts of digestive tract: Secondary | ICD-10-CM | POA: Diagnosis not present

## 2017-10-30 DIAGNOSIS — Z96652 Presence of left artificial knee joint: Secondary | ICD-10-CM | POA: Insufficient documentation

## 2017-10-30 DIAGNOSIS — F329 Major depressive disorder, single episode, unspecified: Secondary | ICD-10-CM | POA: Insufficient documentation

## 2017-10-30 DIAGNOSIS — Z79899 Other long term (current) drug therapy: Secondary | ICD-10-CM | POA: Insufficient documentation

## 2017-10-30 DIAGNOSIS — E119 Type 2 diabetes mellitus without complications: Secondary | ICD-10-CM | POA: Insufficient documentation

## 2017-10-30 DIAGNOSIS — N390 Urinary tract infection, site not specified: Secondary | ICD-10-CM | POA: Diagnosis not present

## 2017-10-30 LAB — CBG MONITORING, ED: GLUCOSE-CAPILLARY: 92 mg/dL (ref 65–99)

## 2017-10-30 LAB — URINALYSIS, ROUTINE W REFLEX MICROSCOPIC
BILIRUBIN URINE: NEGATIVE
Glucose, UA: NEGATIVE mg/dL
Ketones, ur: NEGATIVE mg/dL
Nitrite: NEGATIVE
Protein, ur: NEGATIVE mg/dL
SPECIFIC GRAVITY, URINE: 1.032 — AB (ref 1.005–1.030)
pH: 5 (ref 5.0–8.0)

## 2017-10-30 LAB — CBC
HEMATOCRIT: 38.7 % (ref 36.0–46.0)
Hemoglobin: 12.6 g/dL (ref 12.0–15.0)
MCH: 30.5 pg (ref 26.0–34.0)
MCHC: 32.6 g/dL (ref 30.0–36.0)
MCV: 93.7 fL (ref 78.0–100.0)
Platelets: 175 10*3/uL (ref 150–400)
RBC: 4.13 MIL/uL (ref 3.87–5.11)
RDW: 14 % (ref 11.5–15.5)
WBC: 7.2 10*3/uL (ref 4.0–10.5)

## 2017-10-30 LAB — BASIC METABOLIC PANEL
Anion gap: 10 (ref 5–15)
BUN: 11 mg/dL (ref 6–20)
CHLORIDE: 101 mmol/L (ref 101–111)
CO2: 27 mmol/L (ref 22–32)
Calcium: 9 mg/dL (ref 8.9–10.3)
Creatinine, Ser: 0.74 mg/dL (ref 0.44–1.00)
GFR calc non Af Amer: >60 mL/min (ref >60)
Glucose, Bld: 91 mg/dL (ref 65–99)
POTASSIUM: 3.3 mmol/L — AB (ref 3.5–5.1)
SODIUM: 138 mmol/L (ref 135–145)

## 2017-10-30 MED ORDER — ONDANSETRON 4 MG PO TBDP
4.0000 mg | ORAL_TABLET | Freq: Once | ORAL | Status: AC
  Administered 2017-10-30: 4 mg via ORAL
  Filled 2017-10-30: qty 1

## 2017-10-30 MED ORDER — CEPHALEXIN 500 MG PO CAPS: 500.0000 mg | ORAL_CAPSULE | Freq: Two times a day (BID) | ORAL | 0 refills | Status: AC

## 2017-10-30 MED ORDER — POTASSIUM CHLORIDE CRYS ER 20 MEQ PO TBCR
40.0000 meq | EXTENDED_RELEASE_TABLET | Freq: Once | ORAL | Status: AC
  Administered 2017-10-30: 40 meq via ORAL
  Filled 2017-10-30: qty 2

## 2017-10-30 MED ORDER — ISOSORBIDE MONONITRATE 20 MG PO TABS
20.0000 mg | ORAL_TABLET | Freq: Once | ORAL | Status: AC
  Administered 2017-10-30: 20 mg via ORAL
  Filled 2017-10-30 (×2): qty 1

## 2017-10-30 MED ORDER — CHLORTHALIDONE 25 MG PO TABS
25.0000 mg | ORAL_TABLET | Freq: Once | ORAL | Status: AC
  Administered 2017-10-30: 25 mg via ORAL
  Filled 2017-10-30: qty 1

## 2017-10-30 MED ORDER — ONDANSETRON 4 MG PO TBDP: 4.0000 mg | ORAL_TABLET | Freq: Three times a day (TID) | ORAL | 0 refills | Status: DC | PRN

## 2017-10-30 MED ORDER — CLONAZEPAM 0.5 MG PO TABS
1.0000 mg | ORAL_TABLET | Freq: Once | ORAL | Status: AC
  Administered 2017-10-30: 1 mg via ORAL
  Filled 2017-10-30: qty 2

## 2017-10-30 MED ORDER — CARVEDILOL 12.5 MG PO TABS
6.2500 mg | ORAL_TABLET | Freq: Once | ORAL | Status: AC
  Administered 2017-10-30: 6.25 mg via ORAL
  Filled 2017-10-30: qty 1

## 2017-10-30 MED ORDER — HYDRALAZINE HCL 25 MG PO TABS
50.0000 mg | ORAL_TABLET | Freq: Once | ORAL | Status: AC
  Administered 2017-10-30: 50 mg via ORAL
  Filled 2017-10-30: qty 2

## 2017-10-30 NOTE — ED Provider Notes (Signed)
[facility] EMERGENCY DEPARTMENT Provider Note   CSN: [id number] Arrival date & time: 10/30/17  1207     History   Chief Complaint No chief complaint on file.   HPI [name] is a 83 y.o. female.  83yo F w/ PMH including vertigo, dCHF, anxiety/depression, T2DM, OSA who p/w dizziness. She reports a history of vertigo and has had medicine in the past but states that medicine is not working any more. She states for the past few days it has been worse despite taking meclizine. She reports associated nausea, poor sleep occasional diarrhea. No chest pain or SOB, fevers, urinary symptoms, cold symptoms. She had a headache the other night but none currently. She has had nocturia recently, started on new medication with some improvement in sx. No dysuria or hematuria.   The history is provided by the patient.    Past Medical History:  Diagnosis Date  . Abdominal pain   . Anemia    NOS  . Anxiety   . Back pain   . Bruises easily   . Chronic diastolic CHF (congestive heart failure) ([place])    a. 06/2013 EF 65-70%.  . Chronic headaches    HISTORY OF   . Depression   . Diabetes mellitus    type II  DIET CONTROLLED  . Diarrhea   . Hepatitis    AGE 30S  . Hyperlipidemia   . Hypertension   . Mild aortic stenosis    a. 06/2013 Echo: EF 65-70%, mod LVH with focal basal hypertrophy, very mild AS.  Marland Kitchen Osteoarthritis   . Oxygen desaturation during sleep    USES 2 LITERS BEDTIME   VIA CPAP 06/2012 [facility]   . Shortness of breath    WITH EXERTION USES 2 L O2 BEDTIME  . Sleep apnea    CPAP WITH O2 2 LITERS 2013 ([facility])  . Wears dentures   . Wears glasses     Patient Active Problem List   Diagnosis Date Noted  . Contact dermatitis and eczema 06/12/2017  . Major depressive disorder, recurrent episode with anxious distress ([place]) 02/04/2017  . Dementia arising in the senium and presenium 06/27/2016  . Obesity, Class II, BMI 35-39.9, with comorbidity 05/15/2016  .  Insomnia w/ sleep apnea 10/16/2015  . Other constipation 03/16/2015  . IBS (irritable bowel syndrome) 02/27/2015  . Lumbar spondylosis 02/10/2015  . Lumbar radiculopathy 02/10/2015  . Osteoarthritis of right knee 02/10/2015  . Hashimoto's thyroiditis 05/05/2014  . Hyperglycemia 02/21/2014  . Adrenal incidentaloma ([place]) 11/04/2013  . Benign paroxysmal positional vertigo 07/15/2013  . Aortic stenosis, mild 07/14/2013  . Hypokalemia 04/16/2013  . OAB (overactive bladder) 02/24/2013  . GERD with stricture 12/24/2012  . Sleep-related hypoventilation 07/30/2012  . OSA (obstructive sleep apnea) 04/16/2012  . Osteopenia 04/16/2012  . Preventative health care 05/24/2011  . VITAMIN D DEFICIENCY 12/04/2010  . Hyperlipidemia with target LDL less than 130 12/03/2010  . GAD (generalized anxiety disorder) 12/03/2010  . Essential hypertension 12/03/2010    Past Surgical History:  Procedure Laterality Date  . ABDOMINAL HYSTERECTOMY    . APPENDECTOMY    . CHOLECYSTECTOMY    . JOINT REPLACEMENT     LEFT KNEE   . KNEE ARTHROPLASTY Right 03/22/2015   Procedure: COMPUTER ASSISTED TOTAL KNEE ARTHROPLASTY;  Surgeon: Marybelle Killings, MD;  Location: [facility];  Service: Orthopedics;  Laterality: Right;  . KNEE ARTHROSCOPY     RIGHT  . LEFT AND RIGHT HEART CATHETERIZATION WITH CORONARY  ANGIOGRAM N/A 10/28/2013   Procedure: LEFT AND RIGHT HEART CATHETERIZATION WITH CORONARY ANGIOGRAM;  Surgeon: Blane Ohara, MD;  Location: [facility] CATH LAB;  Service: Cardiovascular;  Laterality: N/A;  . LEFT HEART CATHETERIZATION WITH CORONARY ANGIOGRAM N/A 11/14/2014   Procedure: LEFT HEART CATHETERIZATION WITH CORONARY ANGIOGRAM;  Surgeon: Josue Hector, MD;  Location: [facility] CATH LAB;  Service: Cardiovascular;  Laterality: N/A;  . REVISION TOTAL KNEE ARTHROPLASTY  2011  . ROTATOR CUFF REPAIR  2010  . SHOULDER ARTHROSCOPY  09/17/2012   Procedure: ARTHROSCOPY SHOULDER;  Surgeon: Sharmon Revere, MD;  Location: [facility];  Service:  Orthopedics;  Laterality: Left;  . TONSILLECTOMY    . TONSILLECTOMY      OB History    No data available       Home Medications    Prior to Admission medications   Medication Sig Start Date End Date Taking? Authorizing Provider  calcium carbonate (OS-CAL) 600 MG TABS tablet Take 600 mg by mouth 2 (two) times daily with a meal.   Yes [provider]  carvedilol (COREG) 6.25 MG tablet take 1 tablet by mouth twice a day with meals 06/12/17  Yes [name], MD  chlorthalidone (HYGROTON) 25 MG tablet Take 1 tablet (25 mg total) by mouth daily. 05/20/17  Yes [name], MD  cholecalciferol (VITAMIN D) 1000 units tablet Take 2,000 Units by mouth daily.   Yes [provider]  clonazePAM (KLONOPIN) 1 MG tablet Take 1 mg by mouth daily.  10/09/17  Yes [provider]  hydrALAZINE (APRESOLINE) 50 MG tablet Take 1 tablet (50 mg total) 3 (three) times daily by mouth. 10/06/17  Yes [name], MD  isosorbide mononitrate (ISMO,MONOKET) 20 MG tablet Take 1 tablet (20 mg total) by mouth 2 (two) times daily at 10 AM and 5 PM. 12/11/16  Yes [name], MD  meclizine (ANTIVERT) 25 MG tablet take 1 tablet by mouth three times a day if needed for dizziness 10/21/17  Yes [name], MD  MYRBETRIQ 25 MG TB24 tablet take 1 tablet by mouth once daily 07/15/17  Yes [name], MD  omeprazole (PRILOSEC) 40 MG capsule Take 1 capsule (40 mg total) by mouth daily. Patient taking differently: Take 40 mg by mouth daily as needed (heartburn).  12/11/16  Yes [name], MD  oxyCODONE-acetaminophen (PERCOCET) 10-325 MG tablet Take 1 tablet by mouth every 8 (eight) hours as needed for pain. 10/27/17  Yes [name], MD  traZODone (DESYREL) 100 MG tablet Take 1 tablet (100 mg total) by mouth at bedtime. 10/27/17  Yes [name], MD  cephALEXin (KEFLEX) 500 MG capsule Take 1 capsule (500 mg total) by mouth 2 (two) times daily for 7 days. 10/30/17 11/06/17  Kristyn Obyrne,  Wenda Overland, MD  DULoxetine (CYMBALTA) 30 MG capsule 1  qam  For  1 week then 2  qam Patient not taking: Reported on 10/30/2017 09/17/17   Norma Fredrickson, MD  ondansetron (ZOFRAN ODT) 4 MG disintegrating tablet Take 1 tablet (4 mg total) by mouth every 8 (eight) hours as needed for nausea or vomiting. 10/30/17   Lindsay Straka, Wenda Overland, MD    Family History Family History  Problem Relation Age of Onset  . Cancer Father        prostate cancer  . Heart disease Sister   . Heart disease Brother   . Alcohol abuse Other   . Hypertension Other   . Kidney disease Other   .  Mental illness Other   . Emphysema Brother     Social History Social History   Tobacco Use  . Smoking status: Never Smoker  . Smokeless tobacco: Never Used  Substance Use Topics  . Alcohol use: No  . Drug use: No     Allergies   Amlodipine; Enalapril; Losartan potassium; Morphine and related; and Metformin   Review of Systems Review of Systems All other systems reviewed and are negative except that which was mentioned in HPI   Physical Exam Updated Vital Signs BP (!) 145/73   Pulse 64   Temp 98.2 F (36.8 C) (Oral)   Resp 15   SpO2 95%   Physical Exam  Constitutional: She is oriented to person, place, and time. She appears well-developed and well-nourished. No distress.  Awake, alert  HENT:  Head: Normocephalic and atraumatic.  Eyes: Conjunctivae and EOM are normal. Pupils are equal, round, and reactive to light.  Neck: Neck supple.  Cardiovascular: Normal rate, regular rhythm and normal heart sounds.  No murmur heard. Pulmonary/Chest: Effort normal and breath sounds normal. No respiratory distress.  Abdominal: Soft. Bowel sounds are normal. She exhibits no distension. There is no tenderness.  Musculoskeletal: She exhibits no edema.  Neurological: She is alert and oriented to person, place, and time. She has normal reflexes. No cranial nerve deficit. She exhibits normal muscle tone.  Fluent  speech, normal finger-to-nose testing, negative pronator drift, no clonus 5/5 strength and normal sensation x all 4 extremities  Skin: Skin is warm and dry.  Psychiatric: She has a normal mood and affect.  Nursing note and vitals reviewed.    ED Treatments / Results  Labs (all labs ordered are listed, but only abnormal results are displayed) Labs Reviewed  BASIC METABOLIC PANEL - Abnormal; Notable for the following components:      Result Value   Potassium 3.3 (*)    All other components within normal limits  URINALYSIS, ROUTINE W REFLEX MICROSCOPIC - Abnormal; Notable for the following components:   APPearance CLOUDY (*)    Specific Gravity, Urine 1.032 (*)    Hgb urine dipstick SMALL (*)    Leukocytes, UA LARGE (*)    Bacteria, UA MANY (*)    Squamous Epithelial / LPF 6-30 (*)    All other components within normal limits  URINE CULTURE  CBC  CBG MONITORING, ED    EKG  EKG Interpretation  Date/Time:  Thursday October 30 2017 12:18:48 EST Ventricular Rate:  74 PR Interval:  144 QRS Duration: 80 QT Interval:  352 QTC Calculation: 390 R Axis:   48 Text Interpretation:  Sinus rhythm with Premature atrial complexes Septal infarct , age undetermined ST & T wave abnormality, consider inferolateral ischemia Abnormal ECG diffuse T wave inversions/ borderline ST depression is slightly more prominent than previous EKG Confirmed by Theotis Burrow [phone]) on 10/30/2017 2:09:52 PM       Radiology No results found.  Procedures Procedures (including critical care time)  Medications Ordered in ED Medications  potassium chloride SA (K-DUR,KLOR-CON) CR tablet 40 mEq (40 mEq Oral Given 10/30/17 1628)  ondansetron (ZOFRAN-ODT) disintegrating tablet 4 mg (4 mg Oral Given 10/30/17 1611)  hydrALAZINE (APRESOLINE) tablet 50 mg (50 mg Oral Given 10/30/17 1628)  isosorbide mononitrate (ISMO,MONOKET) tablet 20 mg (20 mg Oral Given 10/30/17 1629)  carvedilol (COREG) tablet 6.25 mg (6.25 mg  Oral Given 10/30/17 1631)  clonazePAM (KLONOPIN) tablet 1 mg (1 mg Oral Given 10/30/17 1628)  chlorthalidone (HYGROTON) tablet 25 mg (25  mg Oral Given 10/30/17 1628)     Initial Impression / Assessment and Plan / ED Course  I have reviewed the triage vital signs and the nursing notes.  Pertinent labs & imaging results that were available during my care of the patient were reviewed by me and considered in my medical decision making (see chart for details).     Pt with recurrent problems with dizziness that she has had previously but now not improving with meclizine at home.  Neurologically intact on exam with reassuring vital signs.  Her neuro exam was reassuring.  She describes both lightheadedness and vertigo but has no concerning neurologic complaints to suggest acute intracranial process.  I do not feel she needs head imaging at this time especially given that she has had this before.  She does appear to have urinary tract infection on lab work but the remainder of her lab work is normal.  Given her comorbidities and report of nocturia, will treat with course of Keflex.  She has been able to tolerate liquids here and has had no vomiting.  Discussed supportive measures and instructed to follow-up with PCP for reassessment.  Return precautions given patient discharged in satisfactory condition.  Final Clinical Impressions(s) / ED Diagnoses   Final diagnoses:  Urinary tract infection without hematuria, site unspecified  Dizziness    ED Discharge Orders        Ordered    cephALEXin (KEFLEX) 500 MG capsule  2 times daily     10/30/17 1827    ondansetron (ZOFRAN ODT) 4 MG disintegrating tablet  Every 8 hours PRN     10/30/17 1827       Mikey Maffett, Wenda Overland, MD 10/30/17 [phone]

## 2017-10-30 NOTE — ED Triage Notes (Signed)
Patient complains of general weakness and dizziness x 3 days. Seen at her primary and had labs collected on 12/3. Denies neuro deficits, speech clear, denies pain. No CP

## 2017-10-30 NOTE — ED Notes (Signed)
Pt and family understood dc material. NAD Noted. Scripts given at [employer]

## 2017-10-31 ENCOUNTER — Other Ambulatory Visit: Payer: Self-pay | Admitting: Internal Medicine

## 2017-10-31 DIAGNOSIS — F411 Generalized anxiety disorder: Secondary | ICD-10-CM

## 2017-10-31 MED ORDER — CLONAZEPAM 1 MG PO TABS: 1.0000 mg | ORAL_TABLET | Freq: Two times a day (BID) | ORAL | 5 refills | Status: DC | PRN

## 2017-10-31 NOTE — Telephone Encounter (Signed)
RX sent

## 2017-10-31 NOTE — Telephone Encounter (Signed)
Pt informed of same.  

## 2017-11-01 LAB — URINE CULTURE: SPECIAL REQUESTS: NORMAL

## 2017-11-02 ENCOUNTER — Emergency Department (HOSPITAL_COMMUNITY): Payer: Medicare HMO

## 2017-11-02 ENCOUNTER — Encounter (HOSPITAL_COMMUNITY): Payer: Self-pay | Admitting: Emergency Medicine

## 2017-11-02 ENCOUNTER — Emergency Department (HOSPITAL_COMMUNITY)
Admission: EM | Admit: 2017-11-02 | Discharge: 2017-11-02 | Disposition: A | Discharge status: Home / Self Care | Payer: Medicare HMO | Attending: Emergency Medicine | Admitting: Emergency Medicine

## 2017-11-02 DIAGNOSIS — E860 Dehydration: Secondary | ICD-10-CM | POA: Diagnosis not present

## 2017-11-02 DIAGNOSIS — Z96652 Presence of left artificial knee joint: Secondary | ICD-10-CM | POA: Insufficient documentation

## 2017-11-02 DIAGNOSIS — R42 Dizziness and giddiness: Secondary | ICD-10-CM | POA: Diagnosis not present

## 2017-11-02 DIAGNOSIS — E119 Type 2 diabetes mellitus without complications: Secondary | ICD-10-CM | POA: Diagnosis not present

## 2017-11-02 DIAGNOSIS — Z885 Allergy status to narcotic agent status: Secondary | ICD-10-CM | POA: Diagnosis not present

## 2017-11-02 DIAGNOSIS — Z79899 Other long term (current) drug therapy: Secondary | ICD-10-CM | POA: Insufficient documentation

## 2017-11-02 DIAGNOSIS — R531 Weakness: Secondary | ICD-10-CM | POA: Diagnosis not present

## 2017-11-02 DIAGNOSIS — I5032 Chronic diastolic (congestive) heart failure: Secondary | ICD-10-CM | POA: Insufficient documentation

## 2017-11-02 DIAGNOSIS — E876 Hypokalemia: Secondary | ICD-10-CM | POA: Insufficient documentation

## 2017-11-02 DIAGNOSIS — I110 Hypertensive heart disease with heart failure: Secondary | ICD-10-CM | POA: Insufficient documentation

## 2017-11-02 DIAGNOSIS — F0390 Unspecified dementia without behavioral disturbance: Secondary | ICD-10-CM | POA: Diagnosis not present

## 2017-11-02 DIAGNOSIS — I509 Heart failure, unspecified: Secondary | ICD-10-CM | POA: Diagnosis not present

## 2017-11-02 LAB — COMPREHENSIVE METABOLIC PANEL
ALT: 10 U/L — AB (ref 14–54)
ANION GAP: 8 (ref 5–15)
AST: 17 U/L (ref 15–41)
Albumin: 3.3 g/dL — ABNORMAL LOW (ref 3.5–5.0)
Alkaline Phosphatase: 44 U/L (ref 38–126)
BUN: 14 mg/dL (ref 6–20)
CHLORIDE: 98 mmol/L — AB (ref 101–111)
CO2: 32 mmol/L (ref 22–32)
CREATININE: 0.62 mg/dL (ref 0.44–1.00)
Calcium: 9.1 mg/dL (ref 8.9–10.3)
Glucose, Bld: 114 mg/dL — ABNORMAL HIGH (ref 65–99)
POTASSIUM: 3 mmol/L — AB (ref 3.5–5.1)
SODIUM: 138 mmol/L (ref 135–145)
Total Bilirubin: 0.8 mg/dL (ref 0.3–1.2)
Total Protein: 5.9 g/dL — ABNORMAL LOW (ref 6.5–8.1)

## 2017-11-02 LAB — URINALYSIS, ROUTINE W REFLEX MICROSCOPIC
Bilirubin Urine: NEGATIVE
GLUCOSE, UA: NEGATIVE mg/dL
HGB URINE DIPSTICK: NEGATIVE
KETONES UR: NEGATIVE mg/dL
NITRITE: NEGATIVE
PROTEIN: NEGATIVE mg/dL
Specific Gravity, Urine: 1.024 (ref 1.005–1.030)
pH: 5 (ref 5.0–8.0)

## 2017-11-02 LAB — CBC WITH DIFFERENTIAL/PLATELET
Basophils Absolute: 0 10*3/uL (ref 0.0–0.1)
Basophils Relative: 0 %
EOS ABS: 0.2 10*3/uL (ref 0.0–0.7)
Eosinophils Relative: 2 %
HEMATOCRIT: 37.8 % (ref 36.0–46.0)
HEMOGLOBIN: 11.8 g/dL — AB (ref 12.0–15.0)
LYMPHS ABS: 2.3 10*3/uL (ref 0.7–4.0)
Lymphocytes Relative: 26 %
MCH: 29.7 pg (ref 26.0–34.0)
MCHC: 31.2 g/dL (ref 30.0–36.0)
MCV: 95.2 fL (ref 78.0–100.0)
MONOS PCT: 6 %
Monocytes Absolute: 0.5 10*3/uL (ref 0.1–1.0)
NEUTROS ABS: 6 10*3/uL (ref 1.7–7.7)
NEUTROS PCT: 66 %
Platelets: 159 10*3/uL (ref 150–400)
RBC: 3.97 MIL/uL (ref 3.87–5.11)
RDW: 13.8 % (ref 11.5–15.5)
WBC: 9 10*3/uL (ref 4.0–10.5)

## 2017-11-02 MED ORDER — SODIUM CHLORIDE 0.9 % IV BOLUS (SEPSIS)
500.0000 mL | Freq: Once | INTRAVENOUS | Status: AC
  Administered 2017-11-02: 500 mL via INTRAVENOUS

## 2017-11-02 MED ORDER — POTASSIUM CHLORIDE CRYS ER 20 MEQ PO TBCR
40.0000 meq | EXTENDED_RELEASE_TABLET | Freq: Once | ORAL | Status: AC
  Administered 2017-11-02: 40 meq via ORAL
  Filled 2017-11-02: qty 2

## 2017-11-02 NOTE — ED Triage Notes (Signed)
Patient BIB daughter, reports she was dx with UTI x3 days ago. C/o generalized weakness, left hip and right jaw pain after fall three weeks ago. Denies N/V/D, chest pain, and SOB.

## 2017-11-02 NOTE — Discharge Instructions (Signed)
Urinalysis was negative.  Follow-up with your doctor as needed.  Today's workup without any significant findings.

## 2017-11-02 NOTE — ED Provider Notes (Signed)
[facility] DEPT Provider Note   CSN: [id number] Arrival date & time: 11/02/17  1256     History   Chief Complaint Chief Complaint  Patient presents with  . Weakness    HPI [name] is a 83 y.o. female.  HPI  Patient presents with her daughter who assists with the HPI. Patient was here 3 days ago, evaluated for dizziness, diagnosed with urinary tract infection. She notes that in spite of that evaluation and initiation of new medication she continues to have generalized weakness, without new fall, without new diarrhea. There is ongoing dizziness, though this is been present for a long time. No other new medication or diet changes. No fever, no chills. Patient states that there is some anorexia, but the daughter corrects her, and states that the patient was able to eat a meal 2 days ago.   Past Medical History:  Diagnosis Date  . Abdominal pain   . Anemia    NOS  . Anxiety   . Back pain   . Bruises easily   . Chronic diastolic CHF (congestive heart failure) ([place])    a. 06/2013 EF 65-70%.  . Chronic headaches    HISTORY OF   . Depression   . Diabetes mellitus    type II  DIET CONTROLLED  . Diarrhea   . Hepatitis    AGE 32S  . Hyperlipidemia   . Hypertension   . Mild aortic stenosis    a. 06/2013 Echo: EF 65-70%, mod LVH with focal basal hypertrophy, very mild AS.  Marland Kitchen Osteoarthritis   . Oxygen desaturation during sleep    USES 2 LITERS BEDTIME   [facility] CPAP [facility]2012 [facility]   . Shortness of breath    WITH EXERTION USES 2 L O2 BEDTIME  . Sleep apnea    CPAP WITH O2 2 LITERS 2013 ([facility])  . Wears dentures   . Wears glasses     Patient Active Problem List   Diagnosis Date Noted  . Contact dermatitis and eczema 06/12/2017  . Major depressive disorder, recurrent episode with anxious distress ([place]) 02/04/2017  . Dementia arising in the senium and presenium 06/27/2016  . Obesity, Class II, BMI 35-39.9, with comorbidity  05/15/2016  . Insomnia w/ sleep apnea 10/16/2015  . Other constipation 03/16/2015  . IBS (irritable bowel syndrome) 02/27/2015  . Lumbar spondylosis 02/10/2015  . Lumbar radiculopathy 02/10/2015  . Osteoarthritis of right knee 02/10/2015  . Hashimoto's thyroiditis 05/05/2014  . Hyperglycemia 02/21/2014  . Adrenal incidentaloma ([place]) 11/04/2013  . Benign paroxysmal positional vertigo 07/15/2013  . Aortic stenosis, mild 07/14/2013  . Hypokalemia 04/16/2013  . OAB (overactive bladder) 02/24/2013  . GERD with stricture 12/24/2012  . Sleep-related hypoventilation 07/30/2012  . OSA (obstructive sleep apnea) 04/16/2012  . Osteopenia 04/16/2012  . Preventative health care 05/24/2011  . VITAMIN D DEFICIENCY 12/04/2010  . Hyperlipidemia with target LDL less than 130 12/03/2010  . GAD (generalized anxiety disorder) 12/03/2010  . Essential hypertension 12/03/2010    Past Surgical History:  Procedure Laterality Date  . ABDOMINAL HYSTERECTOMY    . APPENDECTOMY    . CHOLECYSTECTOMY    . JOINT REPLACEMENT     LEFT KNEE   . KNEE ARTHROPLASTY Right 03/22/2015   Procedure: COMPUTER ASSISTED TOTAL KNEE ARTHROPLASTY;  Surgeon: Marybelle Killings, MD;  Location: [facility];  Service: Orthopedics;  Laterality: Right;  . KNEE ARTHROSCOPY     RIGHT  . LEFT AND RIGHT HEART CATHETERIZATION WITH CORONARY  ANGIOGRAM N/A 10/28/2013   Procedure: LEFT AND RIGHT HEART CATHETERIZATION WITH CORONARY ANGIOGRAM;  Surgeon: Blane Ohara, MD;  Location: [facility] CATH LAB;  Service: Cardiovascular;  Laterality: N/A;  . LEFT HEART CATHETERIZATION WITH CORONARY ANGIOGRAM N/A 11/14/2014   Procedure: LEFT HEART CATHETERIZATION WITH CORONARY ANGIOGRAM;  Surgeon: Josue Hector, MD;  Location: [facility] C[facility] LAB;  Service: Cardiovascular;  Laterality: N/A;  . REVISION TOTAL KNEE ARTHROPLASTY  2011  . ROTATOR CUFF REPAIR  2010  . SHOULDER ARTHROSCOPY  09/17/2012   Procedure: ARTHROSCOPY SHOULDER;  Surgeon: Sharmon Revere, MD;  Location: [facility];  Service: Orthopedics;  Laterality: Left;  . TONSILLECTOMY    . TONSILLECTOMY      OB History    No data available       Home Medications    Prior to Admission medications   Medication Sig Start Date End Date Taking? Authorizing Provider  calcium carbonate (OS-CAL) 600 MG TABS tablet Take 600 mg by mouth 2 (two) times daily with a meal.    [provider]  carvedilol (COREG) 6.25 MG tablet take 1 tablet by mouth twice a day with meals 06/12/17   [name], MD  cephALEXin (KEFLEX) 500 MG capsule Take 1 capsule (500 mg total) by mouth 2 (two) times daily for 7 days. 10/30/17 11/06/17  Little, Wenda Overland, MD  chlorthalidone (HYGROTON) 25 MG tablet Take 1 tablet (25 mg total) by mouth daily. 05/20/17   [name], MD  cholecalciferol (VITAMIN D) 1000 units tablet Take 2,000 Units by mouth daily.    [provider]  clonazePAM (KLONOPIN) 1 MG tablet Take 1 tablet (1 mg total) by mouth 2 (two) times daily as needed for anxiety. 10/31/17   [name], MD  DULoxetine (CYMBALTA) 30 MG capsule 1  qam  For  1 week then 2  qam Patient not taking: Reported on 10/30/2017 09/17/17   Norma Fredrickson, MD  hydrALAZINE (APRESOLINE) 50 MG tablet Take 1 tablet (50 mg total) 3 (three) times daily by mouth. 10/06/17   [name], MD  isosorbide mononitrate (ISMO,MONOKET) 20 MG tablet Take 1 tablet (20 mg total) by mouth 2 (two) times daily at 10 AM and 5 PM. 12/11/16   [name], MD  meclizine (ANTIVERT) 25 MG tablet take 1 tablet by mouth three times a day if needed for dizziness 10/21/17   [name], MD  MYRBETRIQ 25 MG TB24 tablet take 1 tablet by mouth once daily 07/15/17   [name], MD  omeprazole (PRILOSEC) 40 MG capsule Take 1 capsule (40 mg total) by mouth daily. Patient taking differently: Take 40 mg by mouth daily as needed (heartburn).  12/11/16   [name], MD  ondansetron (ZOFRAN ODT) 4 MG disintegrating tablet Take 1 tablet (4 mg  total) by mouth every 8 (eight) hours as needed for nausea or vomiting. 10/30/17   Little, Wenda Overland, MD  oxyCODONE-acetaminophen (PERCOCET) 10-325 MG tablet Take 1 tablet by mouth every 8 (eight) hours as needed for pain. 10/27/17   [name], MD  traZODone (DESYREL) 100 MG tablet Take 1 tablet (100 mg total) by mouth at bedtime. 10/27/17   [name], MD    Family History Family History  Problem Relation Age of Onset  . Cancer Father        prostate cancer  . Heart disease Sister   . Heart disease Brother   . Alcohol abuse Other   .  Hypertension Other   . Kidney disease Other   . Mental illness Other   . Emphysema Brother     Social History Social History   Tobacco Use  . Smoking status: Never Smoker  . Smokeless tobacco: Never Used  Substance Use Topics  . Alcohol use: No  . Drug use: No     Allergies   Amlodipine; Enalapril; Losartan potassium; Morphine and related; and Metformin   Review of Systems Review of Systems  Constitutional:       Per HPI, otherwise negative  HENT:       Per HPI, otherwise negative  Respiratory:       Per HPI, otherwise negative  Cardiovascular:       Per HPI, otherwise negative  Gastrointestinal: Positive for nausea. Negative for abdominal pain and vomiting.  Endocrine:       Negative aside from HPI  Genitourinary:       Neg aside from HPI   Musculoskeletal:       Per HPI, otherwise negative  Skin: Negative.   Neurological: Positive for dizziness and weakness. Negative for syncope.     Physical Exam Updated Vital Signs BP 134/63 (BP Location: Left Arm)   Pulse 81   Temp 97.9 F (36.6 C) (Oral)   Resp 18   SpO2 91%   Physical Exam  Constitutional: She is oriented to person, place, and time. She appears well-developed and well-nourished. No distress.  Obese elderly female awake and alert answering questions briefly, appropriately  HENT:  Head: Normocephalic and atraumatic.  Eyes: Conjunctivae and EOM are  normal.  Cardiovascular: Normal rate and regular rhythm.  Pulmonary/Chest: Effort normal and breath sounds normal. No stridor. No respiratory distress.  Abdominal: She exhibits no distension.  Musculoskeletal: She exhibits no edema.  Neurological: She is alert and oriented to person, place, and time. She displays atrophy. No cranial nerve deficit.  Skin: Skin is warm and dry.  Psychiatric: She has a normal mood and affect. She is slowed. Thought content is not delusional. Cognition and memory are not impaired.  Nursing note and vitals reviewed.    ED Treatments / Results  Labs (all labs ordered are listed, but only abnormal results are displayed) Labs Reviewed  COMPREHENSIVE METABOLIC PANEL - Abnormal; Notable for the following components:      Result Value   Potassium 3.0 (*)    Chloride 98 (*)    Glucose, Bld 114 (*)    Total Protein 5.9 (*)    Albumin 3.3 (*)    ALT 10 (*)    All other components within normal limits  CBC WITH DIFFERENTIAL/PLATELET - Abnormal; Notable for the following components:   Hemoglobin 11.8 (*)    All other components within normal limits  URINALYSIS, ROUTINE W REFLEX MICROSCOPIC   Radiology Dg Chest 2 View  Result Date: 11/02/2017 CLINICAL DATA:  Weakness for 1 week, CHF, diabetes mellitus, hypertension EXAM: CHEST  2 VIEW COMPARISON:  04/24/2017 FINDINGS: Upper normal heart size. Lordotic positioning with slight rotation to the RIGHT. Mediastinal contours and pulmonary vascularity normal. Lungs grossly clear. No infiltrate, pleural effusion or pneumothorax. Bones demineralized. IMPRESSION: No acute abnormalities. Electronically Signed   By: Lavonia Dana M.D.   On: 11/02/2017 14:01    Procedures Procedures (including critical care time)  Medications Ordered in ED Medications  sodium chloride 0.9 % bolus 500 mL (not administered)     Initial Impression / Assessment and Plan / ED Course  I have reviewed the triage vital  signs and the nursing  notes.  Pertinent labs & imaging results that were available during my care of the patient were reviewed by me and considered in my medical decision making (see chart for details).  After the initial evaluation I reviewed the patient's chart including documentation from visit 3 days ago, with diagnosis of urinary tract infection.  4:05 PM Patient in no distress, awake, alert, speaking clearly.  Initial labs reassuring aside from mild hypokalemia. Patient feels dehydrated, and has not urinated. Pending urinalysis, if results are reassuring, patient is appropriate for discharge with close outpatient follow-up. Dr. Rogene Houston is aware of the patient, will follow the results, check on the patient.  Final Clinical Impressions(s) / ED Diagnoses  Weakness Hypokalemia   Carmin Muskrat, MD 11/02/17 1606

## 2017-11-02 NOTE — ED Provider Notes (Signed)
Patient turned over to me.  Waiting for urinalysis to come back.  Urine is normal no signs of infection.  Patient will continue current medications and close follow-up with her primary care doctor.  Potassium a little on the low side will need follow-up potassium check.  This discussed with patient and her family.  Patient stable for discharge home.   Fredia Sorrow, MD 11/02/17 1757

## 2017-11-02 NOTE — ED Notes (Signed)
Pt was not able to urinate. 

## 2017-11-05 ENCOUNTER — Ambulatory Visit: Payer: Self-pay

## 2017-11-05 ENCOUNTER — Other Ambulatory Visit: Payer: Self-pay

## 2017-11-05 NOTE — Patient Outreach (Signed)
[facility]) Care Management  11/05/2017  SHERENA MACHORRO [birth year] [id number]   Telephone Screen  Referral Date: 11/04/17 Referral Source: ED Census Report Referral Reason: " 6 or more ED visits in the past 6 months" Insurance: [employer] attempt # 1 to patient. No answer at present and unable to leave message.      Plan: RN CM will make outreach attempt to patient within one business day.   [name], RN,BSN,CCM [facility] Telephonic Care Management Coordinator Direct Phone: [phone] Toll Free: [phone] Fax: [phone]

## 2017-11-06 ENCOUNTER — Other Ambulatory Visit: Payer: Self-pay

## 2017-11-06 NOTE — Patient Outreach (Signed)
[facility]) Care Management  11/06/2017  KAHDIJAH ERRICKSON 10-04-1933 [id number]   Telephone Screen  Referral Date: 11/04/17 Referral Source: ED Census Report Referral Reason: " 6 or more ED visits in the past 6 months" Insurance: [employer] attempt #2 to patient. No answer at present and unable to leave message.     Plan: RN CM will make outreach attempt to patient within one business day.   [name], RN,BSN,CCM [place] Management Telephonic Care Management Coordinator Direct Phone: [phone] Toll Free: [phone] Fax: [phone]

## 2017-11-07 ENCOUNTER — Other Ambulatory Visit: Payer: Self-pay

## 2017-11-07 NOTE — Patient Outreach (Signed)
[facility]) Care Management  11/07/2017  [name] [birth year] [id number]  Telephone Screen  Referral Date:11/04/17 Referral Source:ED Census Report Referral Reason:" 6 or more ED visits in the past 6 months" Insurance:[employer]   Outreach attempt #3 to patient. No answer at present and unable to leave message.     Plan: RN CM will send unsuccessful outreach letter to patient and close case if no response within 10 business days.      [name], RN,BSN,CCM [place] Management Telephonic Care Management Coordinator Direct Phone: [phone] Toll Free: [phone] Fax: [phone]

## 2017-11-21 ENCOUNTER — Other Ambulatory Visit: Payer: Self-pay

## 2017-11-21 NOTE — Patient Outreach (Addendum)
[facility]) Care Management  11/21/2017  [name] 26-Jun-1933 [id number]    Telephone Screen  Referral Date:11/04/17 Referral Source:ED Census Report Referral Reason:" 6 or more ED visits in the past 6 months" Insurance:[employer] Medicare     Multiple attempts to establish contact with patient without success. No response from letter mailed to patient. Case is being closed at this time.       Plan: RN CM will notify [facility] administrative assistant of case status.    [name], RN,BSN,CCM [place] Management Telephonic Care Management Coordinator Direct Phone: [phone] Toll Free: [phone] Fax: [phone]

## 2017-12-01 ENCOUNTER — Other Ambulatory Visit: Payer: Self-pay | Admitting: Internal Medicine

## 2017-12-01 ENCOUNTER — Telehealth: Payer: Self-pay | Admitting: Internal Medicine

## 2017-12-01 DIAGNOSIS — M5416 Radiculopathy, lumbar region: Secondary | ICD-10-CM

## 2017-12-01 DIAGNOSIS — M47816 Spondylosis without myelopathy or radiculopathy, lumbar region: Secondary | ICD-10-CM

## 2017-12-01 DIAGNOSIS — M1711 Unilateral primary osteoarthritis, right knee: Secondary | ICD-10-CM

## 2017-12-01 MED ORDER — OXYCODONE-ACETAMINOPHEN 10-325 MG PO TABS: 1.0000 | ORAL_TABLET | Freq: Three times a day (TID) | ORAL | 0 refills | Status: DC | PRN

## 2017-12-01 NOTE — Telephone Encounter (Signed)
Pt requesting refill on Percocet.

## 2017-12-01 NOTE — Telephone Encounter (Signed)
Check [place] registry last filled 11/05/2017.Marland KitchenJohny Chess

## 2017-12-01 NOTE — Telephone Encounter (Signed)
Copied from [place] [id number]. Topic: Quick Communication - Rx Refill/Question >> Dec 01, 2017  1:34 PM Patrice Paradise wrote: Has the patient contacted their pharmacy? Yes.   oxyCODONE-acetaminophen (PERCOCET) 10-325 MG tablet  (Agent: If no, request that the patient contact the pharmacy for the refill.)  Preferred Pharmacy (with phone number or street name):  [facility] [address] [place], [place] [address] Phone: [phone] Fax: [phone]   Agent: Please be advised that RX refills may take up to 3 business days. We ask that you follow-up with your pharmacy.

## 2017-12-02 NOTE — Telephone Encounter (Signed)
MD approved refill notified pt rx has been sent to [facility].Marland KitchenJohny [name]

## 2017-12-11 DIAGNOSIS — Z961 Presence of intraocular lens: Secondary | ICD-10-CM | POA: Diagnosis not present

## 2017-12-11 DIAGNOSIS — H35363 Drusen (degenerative) of macula, bilateral: Secondary | ICD-10-CM | POA: Diagnosis not present

## 2017-12-11 DIAGNOSIS — H40013 Open angle with borderline findings, low risk, bilateral: Secondary | ICD-10-CM | POA: Diagnosis not present

## 2017-12-11 LAB — HM DIABETES EYE EXAM

## 2017-12-16 ENCOUNTER — Ambulatory Visit: Payer: Self-pay | Admitting: Internal Medicine

## 2017-12-17 ENCOUNTER — Ambulatory Visit (HOSPITAL_COMMUNITY): Payer: Self-pay | Admitting: Psychiatry

## 2017-12-17 ENCOUNTER — Ambulatory Visit (INDEPENDENT_AMBULATORY_CARE_PROVIDER_SITE_OTHER): Payer: Medicare HMO | Admitting: Internal Medicine

## 2017-12-17 ENCOUNTER — Encounter: Payer: Self-pay | Admitting: Internal Medicine

## 2017-12-17 ENCOUNTER — Other Ambulatory Visit (INDEPENDENT_AMBULATORY_CARE_PROVIDER_SITE_OTHER): Payer: Medicare HMO

## 2017-12-17 VITALS — BP 154/90 | HR 74 | Temp 97.7°F | Resp 16 | Ht 62.0 in | Wt 206.0 lb

## 2017-12-17 DIAGNOSIS — I10 Essential (primary) hypertension: Secondary | ICD-10-CM | POA: Diagnosis not present

## 2017-12-17 DIAGNOSIS — H8110 Benign paroxysmal vertigo, unspecified ear: Secondary | ICD-10-CM | POA: Diagnosis not present

## 2017-12-17 DIAGNOSIS — F339 Major depressive disorder, recurrent, unspecified: Secondary | ICD-10-CM | POA: Diagnosis not present

## 2017-12-17 DIAGNOSIS — E876 Hypokalemia: Secondary | ICD-10-CM | POA: Diagnosis not present

## 2017-12-17 MED ORDER — MECLIZINE HCL 25 MG PO TABS: ORAL_TABLET | ORAL | 2 refills | Status: DC

## 2017-12-17 MED ORDER — TRAZODONE HCL 100 MG PO TABS: 200.0000 mg | ORAL_TABLET | Freq: Every day | ORAL | 1 refills | Status: DC

## 2017-12-17 NOTE — Patient Instructions (Signed)

## 2017-12-17 NOTE — Progress Notes (Signed)
Subjective:  Patient ID: [name], female    DOB: [birth year]  Age: 84 y.o. MRN: [id number]  CC: Hypertension   HPI DAISA STENNIS presents for a BP - She complains of her usual symptoms of chronic dizzy spells.  She is getting symptom relief with meclizine.  She also complains of chronic insomnia, anxiety, and anhedonia and wants to increase the dose of trazodone.  She tells me her blood pressure has been well controlled.  Outpatient Medications Prior to Visit  Medication Sig Dispense Refill  . calcium carbonate (OS-CAL) 600 MG TABS tablet Take 600 mg by mouth 2 (two) times daily with a meal.    . carvedilol (COREG) 6.25 MG tablet take 1 tablet by mouth twice a day with meals 60 tablet 11  . chlorthalidone (HYGROTON) 25 MG tablet Take 1 tablet (25 mg total) by mouth daily. 90 tablet 1  . cholecalciferol (VITAMIN D) 1000 units tablet Take 2,000 Units by mouth daily.    . clonazePAM (KLONOPIN) 1 MG tablet Take 1 tablet (1 mg total) by mouth 2 (two) times daily as needed for anxiety. 60 tablet 5  . hydrALAZINE (APRESOLINE) 50 MG tablet Take 1 tablet (50 mg total) 3 (three) times daily by mouth. 270 tablet 1  . MYRBETRIQ 25 MG TB24 tablet take 1 tablet by mouth once daily 30 tablet 8  . omeprazole (PRILOSEC) 40 MG capsule Take 1 capsule (40 mg total) by mouth daily. 90 capsule 3  . oxyCODONE-acetaminophen (PERCOCET) 10-325 MG tablet Take 1 tablet by mouth every 8 (eight) hours as needed for pain. 90 tablet 0  . meclizine (ANTIVERT) 25 MG tablet take 1 tablet by mouth three times a day if needed for dizziness 60 tablet 2  . ondansetron (ZOFRAN ODT) 4 MG disintegrating tablet Take 1 tablet (4 mg total) by mouth every 8 (eight) hours as needed for nausea or vomiting. 8 tablet 0  . traZODone (DESYREL) 100 MG tablet Take 1 tablet (100 mg total) by mouth at bedtime. 90 tablet 1  . DULoxetine (CYMBALTA) 30 MG capsule 1  qam  For  1 week then 2  qam (Patient not taking: Reported on 10/30/2017) 60  capsule 5  . isosorbide mononitrate (ISMO,MONOKET) 20 MG tablet Take 1 tablet (20 mg total) by mouth 2 (two) times daily at 10 AM and 5 PM. (Patient not taking: Reported on 11/02/2017) 180 tablet 3  . temazepam (RESTORIL) 15 MG capsule   0   No facility-administered medications prior to visit.     ROS Review of Systems  Constitutional: Negative for appetite change, diaphoresis, fatigue and unexpected weight change.  HENT: Negative.   Eyes: Negative for visual disturbance.  Respiratory: Negative for cough, chest tightness, shortness of breath and wheezing.   Gastrointestinal: Negative for abdominal pain, constipation, diarrhea, nausea and vomiting.  Endocrine: Negative.   Genitourinary: Negative.  Negative for difficulty urinating.  Musculoskeletal: Positive for arthralgias, back pain and gait problem.  Skin: Negative.  Negative for color change and rash.  Allergic/Immunologic: Negative.   Neurological: Positive for dizziness. Negative for weakness and light-headedness.  Hematological: Negative for adenopathy. Does not bruise/bleed easily.  Psychiatric/Behavioral: Positive for dysphoric mood and sleep disturbance. Negative for confusion, decreased concentration, self-injury and suicidal ideas. The patient is nervous/anxious.     Objective:  BP (!) 154/90 (BP Location: Left Arm, Patient Position: Sitting, Cuff Size: Large)   Pulse 74   Temp 97.7 F (36.5 C) (Oral)   Resp 16  Ht 5\' 2"  (1.575 m)   Wt 206 lb (93.4 kg)   SpO2 94%   BMI 37.68 kg/m   BP Readings from Last 3 Encounters:  12/17/17 (!) 154/90  11/02/17 (!) 154/74  10/30/17 109/71    Wt Readings from Last 3 Encounters:  12/17/17 206 lb (93.4 kg)  10/27/17 206 lb (93.4 kg)  08/05/17 200 lb (90.7 kg)    Physical Exam  Constitutional: She is oriented to person, place, and time. No distress.  HENT:  Mouth/Throat: Oropharynx is clear and moist. No oropharyngeal exudate.  Eyes: Conjunctivae are normal. Left eye  exhibits no discharge. No scleral icterus.  Neck: Normal range of motion. Neck supple. No JVD present. No thyromegaly present.  Cardiovascular: Normal rate, regular rhythm and normal heart sounds. Exam reveals no gallop.  No murmur heard. Pulmonary/Chest: Effort normal and breath sounds normal. No respiratory distress. She has no wheezes. She has no rales.  Abdominal: Soft. Bowel sounds are normal. She exhibits no distension and no mass. There is no tenderness.  Musculoskeletal: Normal range of motion. She exhibits no edema, tenderness or deformity.  Lymphadenopathy:    She has no cervical adenopathy.  Neurological: She is alert and oriented to person, place, and time.  Skin: Skin is warm and dry. No rash noted. She is not diaphoretic. No erythema. No pallor.  Vitals reviewed.   Lab Results  Component Value Date   WBC 9.0 11/02/2017   HGB 11.8 (L) 11/02/2017   HCT 37.8 11/02/2017   PLT 159 11/02/2017   GLUCOSE 103 (H) 12/17/2017   CHOL 229 (H) 06/12/2017   TRIG 233.0 (H) 06/12/2017   HDL 63.30 06/12/2017   LDLDIRECT 121.0 06/12/2017   LDLCALC 106 (H) 05/16/2015   ALT 10 (L) 11/02/2017   AST 17 11/02/2017   NA 130 (L) 12/17/2017   K 3.3 (L) 12/17/2017   CL 92 (L) 12/17/2017   CREATININE 0.60 12/17/2017   BUN 7 12/17/2017   CO2 29 12/17/2017   TSH 2.695 02/03/2017   INR 1.01 03/10/2015   HGBA1C 5.3 10/27/2017    Dg Chest 2 View  Result Date: 11/02/2017 CLINICAL DATA:  Weakness for 1 week, CHF, diabetes mellitus, hypertension EXAM: CHEST  2 VIEW COMPARISON:  04/24/2017 FINDINGS: Upper normal heart size. Lordotic positioning with slight rotation to the RIGHT. Mediastinal contours and pulmonary vascularity normal. Lungs grossly clear. No infiltrate, pleural effusion or pneumothorax. Bones demineralized. IMPRESSION: No acute abnormalities. Electronically Signed   By: Lavonia Dana M.D.   On: 11/02/2017 14:01    Assessment & Plan:   Jamariah was seen today for  hypertension.  Diagnoses and all orders for this visit:  Essential hypertension- Her blood pressure is adequately well controlled but she has developed hypokalemia and hypomagnesemia. -     Basic metabolic panel; Future -     Magnesium; Future -     potassium chloride (K-DUR) 10 MEQ tablet; Take 1 tablet (10 mEq total) by mouth 3 (three) times daily.  Hypokalemia- Will treat the low potassium with the potassium supplement and also we will also replete her magnesium level. -     Basic metabolic panel; Future -     Magnesium; Future -     potassium chloride (K-DUR) 10 MEQ tablet; Take 1 tablet (10 mEq total) by mouth 3 (three) times daily. -     Magnesium Oxide 400 (240 Mg) MG TABS; Take 1 tablet (400 mg total) by mouth daily.  Major depressive disorder, recurrent episode with  anxious distress ([place])-  Will increase the trazodone dose at her request. -     traZODone (DESYREL) 100 MG tablet; Take 2 tablets (200 mg total) by mouth at bedtime.  Benign paroxysmal positional vertigo, unspecified laterality -     meclizine (ANTIVERT) 25 MG tablet; take 1 tablet by mouth three times a day if needed for dizziness  Hypomagnesemia -     Magnesium Oxide 400 (240 Mg) MG TABS; Take 1 tablet (400 mg total) by mouth daily.   I have discontinued Zhanae H. Pattison's ondansetron. I have also changed her traZODone. Additionally, I am having her start on potassium chloride and Magnesium Oxide. Lastly, I am having her maintain her calcium carbonate, cholecalciferol, isosorbide mononitrate, omeprazole, chlorthalidone, carvedilol, MYRBETRIQ, DULoxetine, hydrALAZINE, clonazePAM, temazepam, oxyCODONE-acetaminophen, and meclizine.  Meds ordered this encounter  Medications  . traZODone (DESYREL) 100 MG tablet    Sig: Take 2 tablets (200 mg total) by mouth at bedtime.    Dispense:  180 tablet    Refill:  1  . meclizine (ANTIVERT) 25 MG tablet    Sig: take 1 tablet by mouth three times a day if needed for dizziness     Dispense:  60 tablet    Refill:  2  . potassium chloride (K-DUR) 10 MEQ tablet    Sig: Take 1 tablet (10 mEq total) by mouth 3 (three) times daily.    Dispense:  270 tablet    Refill:  0  . Magnesium Oxide 400 (240 Mg) MG TABS    Sig: Take 1 tablet (400 mg total) by mouth daily.    Dispense:  90 tablet    Refill:  0     Follow-up: Return in about 6 months (around 06/16/2018).  Scarlette Calico, MD

## 2017-12-18 ENCOUNTER — Encounter: Payer: Self-pay | Admitting: Internal Medicine

## 2017-12-18 LAB — BASIC METABOLIC PANEL
BUN: 7 mg/dL (ref 6–23)
CALCIUM: 9.2 mg/dL (ref 8.4–10.5)
CO2: 29 meq/L (ref 19–32)
Chloride: 92 mEq/L — ABNORMAL LOW (ref 96–112)
Creatinine, Ser: 0.6 mg/dL (ref 0.40–1.20)
GFR: 122.83 mL/min (ref >60.00)
GLUCOSE: 103 mg/dL — AB (ref 70–99)
Potassium: 3.3 mEq/L — ABNORMAL LOW (ref 3.5–5.1)
SODIUM: 130 meq/L — AB (ref 135–145)

## 2017-12-18 LAB — MAGNESIUM: Magnesium: 1.4 mg/dL — ABNORMAL LOW (ref 1.5–2.5)

## 2017-12-18 MED ORDER — POTASSIUM CHLORIDE ER 10 MEQ PO TBCR: 10.0000 meq | EXTENDED_RELEASE_TABLET | Freq: Three times a day (TID) | ORAL | 0 refills | Status: DC

## 2017-12-18 MED ORDER — MAGNESIUM OXIDE -MG SUPPLEMENT 400 (240 MG) MG PO TABS: 1.0000 | ORAL_TABLET | Freq: Every day | ORAL | 0 refills | Status: DC

## 2017-12-19 ENCOUNTER — Encounter: Payer: Self-pay | Admitting: Internal Medicine

## 2017-12-23 ENCOUNTER — Encounter: Payer: Self-pay | Admitting: Internal Medicine

## 2017-12-23 NOTE — Progress Notes (Signed)
Outside notes received. Information abstracted. Notes sent to scan.  

## 2017-12-28 ENCOUNTER — Other Ambulatory Visit: Payer: Self-pay | Admitting: Internal Medicine

## 2017-12-28 DIAGNOSIS — M1711 Unilateral primary osteoarthritis, right knee: Secondary | ICD-10-CM

## 2017-12-28 DIAGNOSIS — M5416 Radiculopathy, lumbar region: Secondary | ICD-10-CM

## 2017-12-28 DIAGNOSIS — M47816 Spondylosis without myelopathy or radiculopathy, lumbar region: Secondary | ICD-10-CM

## 2017-12-28 MED ORDER — OXYCODONE-ACETAMINOPHEN 10-325 MG PO TABS: 1.0000 | ORAL_TABLET | Freq: Three times a day (TID) | ORAL | 0 refills | Status: DC | PRN

## 2018-01-19 ENCOUNTER — Encounter: Payer: Self-pay | Admitting: Internal Medicine

## 2018-01-19 ENCOUNTER — Ambulatory Visit (INDEPENDENT_AMBULATORY_CARE_PROVIDER_SITE_OTHER)
Admission: RE | Admit: 2018-01-19 | Discharge: 2018-01-19 | Disposition: A | Discharge status: Home / Self Care | Payer: Medicare HMO | Source: Ambulatory Visit | Attending: Internal Medicine | Admitting: Internal Medicine

## 2018-01-19 ENCOUNTER — Ambulatory Visit (INDEPENDENT_AMBULATORY_CARE_PROVIDER_SITE_OTHER): Payer: Medicare HMO | Admitting: Internal Medicine

## 2018-01-19 VITALS — BP 126/78 | HR 65 | Temp 97.8°F | Ht 62.0 in | Wt 206.0 lb

## 2018-01-19 DIAGNOSIS — M79602 Pain in left arm: Secondary | ICD-10-CM | POA: Insufficient documentation

## 2018-01-19 DIAGNOSIS — K5903 Drug induced constipation: Secondary | ICD-10-CM | POA: Diagnosis not present

## 2018-01-19 DIAGNOSIS — K5900 Constipation, unspecified: Secondary | ICD-10-CM | POA: Diagnosis not present

## 2018-01-19 DIAGNOSIS — Z79899 Other long term (current) drug therapy: Secondary | ICD-10-CM | POA: Diagnosis not present

## 2018-01-19 DIAGNOSIS — K5909 Other constipation: Secondary | ICD-10-CM

## 2018-01-19 MED ORDER — NALOXEGOL OXALATE 12.5 MG PO TABS: 12.5000 mg | ORAL_TABLET | Freq: Every day | ORAL | 0 refills | Status: DC

## 2018-01-19 MED ORDER — MAGNESIUM CITRATE PO SOLN: 1.0000 | Freq: Every day | ORAL | 3 refills | Status: DC | PRN

## 2018-01-19 NOTE — Progress Notes (Signed)
   Subjective:    Patient ID: [name], female    DOB: 12-13-1932, 84 y.o.   MRN: [id number]  HPI The patient is an 84 YO female coming in for constipation. Going on for about 1 week. Has tried some otc laxatives but she does not know names of those. No relief with that. She is starting to get some pain in her stomach for about 3 days. Does have nausea and dry heaving. Denies blood in stool. Denies change in medications. Last passed stool about 8 days ago and small pellets at this time and did not feel satisfied with void. She gets the urge to void now but cannot pass any gas or stool. She does take oxycodone regularly and recently refilled by PCP. She is some confused on how she takes that medication. Admits to taking only 2 pills per day but also admits to running out every month right now time (gets #90 per month which would indicate 3 per day). She does not take any medicine regularly to prevent constipation. Cannot recognize the names of otc laxative medications she may have taken.   She is on many sedating medications and has fallen at home several times in the last several months. She did fall about 2 days ago and did not seek care. Some pain in her left arm from hitting it with fall. Denies head injury or LOC. Stayed with her family recently to stay safe from falls. She did have recent increase in dose of trazodone last month with PCP and she denies this change being related.   Review of Systems  Constitutional: Positive for appetite change. Negative for activity change, chills, fatigue and unexpected weight change.  HENT: Negative.   Eyes: Negative.   Respiratory: Negative for cough, chest tightness and shortness of breath.   Cardiovascular: Negative for chest pain, palpitations and leg swelling.  Gastrointestinal: Positive for abdominal distention, abdominal pain, constipation, nausea and vomiting. Negative for anal bleeding, blood in stool and diarrhea.  Musculoskeletal: Positive for  gait problem and myalgias. Negative for arthralgias and back pain.  Skin: Negative.   Neurological: Positive for dizziness. Negative for tremors, syncope, weakness, light-headedness and headaches.  Psychiatric/Behavioral: Positive for decreased concentration.      Objective:   Physical Exam  Constitutional: She is oriented to person, place, and time. She appears well-developed and well-nourished.  HENT:  Head: Normocephalic and atraumatic.  Eyes: EOM are normal.  Neck: Normal range of motion.  Cardiovascular: Normal rate and regular rhythm.  Pulmonary/Chest: Effort normal and breath sounds normal. No respiratory distress. She has no wheezes. She has no rales.  Abdominal: Soft. She exhibits distension. She exhibits no mass. There is tenderness. There is no rebound and no guarding.  BS very sluggish 1 per 1 minute on exam, mild diffuse tenderness without rebound or guarding  Musculoskeletal: She exhibits no edema.  Neurological: She is alert and oriented to person, place, and time. Coordination abnormal.  Slow gait  Skin: Skin is warm and dry.  Psychiatric:  Confusion during the visit with how she is taking medications and what medications she is taking, discrepancy with percocet dosing.    Vitals:   01/19/18 1419  BP: 126/78  Pulse: 65  Temp: 97.8 F (36.6 C)  TempSrc: Oral  SpO2: 97%  Weight: 206 lb (93.4 kg)  Height: 5\' 2"  (1.575 m)      Assessment & Plan:

## 2018-01-19 NOTE — Patient Instructions (Addendum)
We are checking an x-ray today and will call you back later today.   We have sent in a medicine to take today called magnesium citrate which is a liquid. Drink all of this today, then repeat tomorrow and drink another bottle tomorrow if you are not going to the bathroom.   We have also sent in a new medicine to take every day to help avoid constipation. You should start taking this today as well and it is called movantik.  If you have worsening pain go to the hospital or if you are not able to keep down food or liquids due to vomiting or nausea.   Come back to see Dr. Ronnald Ramp to talk to him about the falls and the constipation in about 2 weeks or so.

## 2018-01-19 NOTE — Assessment & Plan Note (Signed)
She is not able to keep track of medications and is on 3+ sedating medications. She declines any change to this regimen today despite several falls at home recently. She is taking percocet 3 per day (although she admits to only 2 per day, but filling every month and denies having extra at home) and trazodone and lorazepam. Checked [place] narcotic database without inappropriate fills but the fill record does not match her description of medications so unclear if her memory changes may be a risk for accidental overdose. Recommended visit with pcp in the next 2 weeks to talk about medications and her recent falls.

## 2018-01-19 NOTE — Assessment & Plan Note (Signed)
S/P fall, she declines any change to her medications today. No indication for imaging today. Advised to continue her current percocet or tylenol for pain. Can use heating pad or ice for pain as well. Given the fall with unsteadiness have asked her to return with PCP for possible adjustment of regimen.

## 2018-01-19 NOTE — Assessment & Plan Note (Signed)
Concern for obstruction today with very limited BS on exam and tenderness with nausea and dry heaving. Checking X-ray abdomen to rule out obstruction. If obstruction will send to ER. If no obstruction will treat with magnesium citrate today and then again tomorrow if no movement. Additionally rx for movantik to prevent her opioid induced constipation from returning.

## 2018-01-28 ENCOUNTER — Other Ambulatory Visit: Payer: Self-pay | Admitting: Internal Medicine

## 2018-01-28 DIAGNOSIS — M47816 Spondylosis without myelopathy or radiculopathy, lumbar region: Secondary | ICD-10-CM

## 2018-01-28 DIAGNOSIS — M1711 Unilateral primary osteoarthritis, right knee: Secondary | ICD-10-CM

## 2018-01-28 DIAGNOSIS — M5416 Radiculopathy, lumbar region: Secondary | ICD-10-CM

## 2018-01-28 NOTE — Telephone Encounter (Signed)
Oxycodone - Acetaminophen 10- 325 mg  LOV: 01/19/18   PCP: Ronnald Ramp  Pharmacy: verified

## 2018-01-28 NOTE — Telephone Encounter (Signed)
Copied from [place]. Topic: Quick Communication - Rx Refill/Question >> Jan 28, 2018 10:50 AM Lolita Rieger, RMA wrote: Medication: oxycodone 10-325 mg   Has the patient contacted their pharmacy? yes   (Agent: If no, request that the patient contact the pharmacy for the refill.)   Preferred Pharmacy (with phone number or street name): [facility] [address] [place]   Agent: Please be advised that RX refills may take up to 3 business days. We ask that you follow-up with your pharmacy.

## 2018-01-28 NOTE — Telephone Encounter (Signed)
Check [place] registry last filled 01/01/2018.Marland KitchenJohny Chess

## 2018-01-29 MED ORDER — OXYCODONE-ACETAMINOPHEN 10-325 MG PO TABS: 1.0000 | ORAL_TABLET | Freq: Three times a day (TID) | ORAL | 0 refills | Status: DC | PRN

## 2018-01-29 NOTE — Telephone Encounter (Signed)
Notified pt rx has been sent to [facility]../lmb 

## 2018-01-31 ENCOUNTER — Other Ambulatory Visit: Payer: Self-pay | Admitting: Internal Medicine

## 2018-01-31 DIAGNOSIS — I10 Essential (primary) hypertension: Secondary | ICD-10-CM

## 2018-01-31 MED ORDER — CHLORTHALIDONE 25 MG PO TABS: 25.0000 mg | ORAL_TABLET | Freq: Every day | ORAL | 0 refills | Status: DC

## 2018-02-16 ENCOUNTER — Other Ambulatory Visit: Payer: Self-pay | Admitting: Internal Medicine

## 2018-02-16 DIAGNOSIS — K219 Gastro-esophageal reflux disease without esophagitis: Secondary | ICD-10-CM

## 2018-02-16 DIAGNOSIS — K222 Esophageal obstruction: Principal | ICD-10-CM

## 2018-02-16 MED ORDER — OMEPRAZOLE 40 MG PO CPDR: 40.0000 mg | DELAYED_RELEASE_CAPSULE | Freq: Every day | ORAL | 1 refills | Status: DC

## 2018-03-02 ENCOUNTER — Other Ambulatory Visit: Payer: Self-pay | Admitting: Internal Medicine

## 2018-03-02 DIAGNOSIS — M1711 Unilateral primary osteoarthritis, right knee: Secondary | ICD-10-CM

## 2018-03-02 DIAGNOSIS — M47816 Spondylosis without myelopathy or radiculopathy, lumbar region: Secondary | ICD-10-CM

## 2018-03-02 DIAGNOSIS — M5416 Radiculopathy, lumbar region: Secondary | ICD-10-CM

## 2018-03-02 MED ORDER — OXYCODONE-ACETAMINOPHEN 10-325 MG PO TABS: 1.0000 | ORAL_TABLET | Freq: Three times a day (TID) | ORAL | 0 refills | Status: DC | PRN

## 2018-03-02 NOTE — Telephone Encounter (Signed)
Copied from [place]. Topic: Quick Communication - Rx Refill/Question >> Mar 02, 2018 10:19 AM Yvette Rack wrote: Medication: oxyCODONE-acetaminophen (PERCOCET) 10-325 MG tablet   Pt is out of this medicine she states that the pahrmacy reached out to provoder last week   Has the patient contacted their pharmacy? Yes.   The pharmacy called her (Agent: If no, request that the patient contact the pharmacy for the refill.) Preferred Pharmacy (with phone number or street name): [facility] [phone] - [place], [address] [address] [place] [phone] ([phone] (Fax) Agent: Please be advised that RX refills may take up to 3 business days. We ask that you follow-up with your pharmacy.

## 2018-03-02 NOTE — Telephone Encounter (Signed)
Oxycodone/ Acetaminophen refill Last OV  01/19/18 PCP Dr. Ronnald Ramp [facility]  [facility] on [address].

## 2018-03-30 ENCOUNTER — Ambulatory Visit (INDEPENDENT_AMBULATORY_CARE_PROVIDER_SITE_OTHER): Payer: Medicare HMO | Admitting: Internal Medicine

## 2018-03-30 ENCOUNTER — Other Ambulatory Visit (INDEPENDENT_AMBULATORY_CARE_PROVIDER_SITE_OTHER): Payer: Medicare HMO

## 2018-03-30 ENCOUNTER — Encounter: Payer: Self-pay | Admitting: Internal Medicine

## 2018-03-30 VITALS — BP 120/70 | HR 72 | Temp 98.3°F | Ht 62.0 in | Wt 193.5 lb

## 2018-03-30 DIAGNOSIS — M1711 Unilateral primary osteoarthritis, right knee: Secondary | ICD-10-CM | POA: Diagnosis not present

## 2018-03-30 DIAGNOSIS — E063 Autoimmune thyroiditis: Secondary | ICD-10-CM | POA: Diagnosis not present

## 2018-03-30 DIAGNOSIS — M5416 Radiculopathy, lumbar region: Secondary | ICD-10-CM

## 2018-03-30 DIAGNOSIS — E559 Vitamin D deficiency, unspecified: Secondary | ICD-10-CM | POA: Diagnosis not present

## 2018-03-30 DIAGNOSIS — I10 Essential (primary) hypertension: Secondary | ICD-10-CM

## 2018-03-30 DIAGNOSIS — E278 Other specified disorders of adrenal gland: Secondary | ICD-10-CM

## 2018-03-30 DIAGNOSIS — M47816 Spondylosis without myelopathy or radiculopathy, lumbar region: Secondary | ICD-10-CM | POA: Diagnosis not present

## 2018-03-30 DIAGNOSIS — N3281 Overactive bladder: Secondary | ICD-10-CM

## 2018-03-30 DIAGNOSIS — E876 Hypokalemia: Secondary | ICD-10-CM | POA: Diagnosis not present

## 2018-03-30 LAB — CBC WITH DIFFERENTIAL/PLATELET
BASOS ABS: 0 10*3/uL (ref 0.0–0.1)
Basophils Relative: 0.5 % (ref 0.0–3.0)
EOS ABS: 0.2 10*3/uL (ref 0.0–0.7)
Eosinophils Relative: 2.8 % (ref 0.0–5.0)
HCT: 39.5 % (ref 36.0–46.0)
Hemoglobin: 13.2 g/dL (ref 12.0–15.0)
LYMPHS ABS: 2.2 10*3/uL (ref 0.7–4.0)
Lymphocytes Relative: 30.1 % (ref 12.0–46.0)
MCHC: 33.3 g/dL (ref 30.0–36.0)
MCV: 90 fl (ref 78.0–100.0)
Monocytes Absolute: 0.4 10*3/uL (ref 0.1–1.0)
Monocytes Relative: 5.6 % (ref 3.0–12.0)
NEUTROS ABS: 4.5 10*3/uL (ref 1.4–7.7)
Neutrophils Relative %: 61 % (ref 43.0–77.0)
PLATELETS: 162 10*3/uL (ref 150.0–400.0)
RBC: 4.39 Mil/uL (ref 3.87–5.11)
RDW: 13.7 % (ref 11.5–15.5)
WBC: 7.3 10*3/uL (ref 4.0–10.5)

## 2018-03-30 LAB — URINALYSIS, ROUTINE W REFLEX MICROSCOPIC
Bilirubin Urine: NEGATIVE
HGB URINE DIPSTICK: NEGATIVE
Ketones, ur: NEGATIVE
Nitrite: NEGATIVE
SPECIFIC GRAVITY, URINE: 1.02 (ref 1.000–1.030)
Total Protein, Urine: NEGATIVE
URINE GLUCOSE: NEGATIVE
UROBILINOGEN UA: 0.2 (ref 0.0–1.0)
pH: 5.5 (ref 5.0–8.0)

## 2018-03-30 LAB — COMPREHENSIVE METABOLIC PANEL
ALT: 7 U/L (ref 0–35)
AST: 12 U/L (ref 0–37)
Albumin: 3.9 g/dL (ref 3.5–5.2)
Alkaline Phosphatase: 43 U/L (ref 39–117)
BILIRUBIN TOTAL: 0.8 mg/dL (ref 0.2–1.2)
BUN: 13 mg/dL (ref 6–23)
CALCIUM: 9.6 mg/dL (ref 8.4–10.5)
CHLORIDE: 98 meq/L (ref 96–112)
CO2: 30 meq/L (ref 19–32)
CREATININE: 0.66 mg/dL (ref 0.40–1.20)
GFR: 109.96 mL/min (ref >60.00)
Glucose, Bld: 94 mg/dL (ref 70–99)
Potassium: 3.4 mEq/L — ABNORMAL LOW (ref 3.5–5.1)
SODIUM: 136 meq/L (ref 135–145)
Total Protein: 6.7 g/dL (ref 6.0–8.3)

## 2018-03-30 LAB — VITAMIN D 25 HYDROXY (VIT D DEFICIENCY, FRACTURES): VITD: 21.1 ng/mL — AB (ref 30.00–100.00)

## 2018-03-30 MED ORDER — FESOTERODINE FUMARATE ER 4 MG PO TB24: 4.0000 mg | ORAL_TABLET | Freq: Every day | ORAL | 1 refills | Status: DC

## 2018-03-30 NOTE — Patient Instructions (Signed)

## 2018-03-30 NOTE — Progress Notes (Signed)
Subjective:  Patient ID: [name], female    DOB: 05-16-1933  Age: 84 y.o. MRN: [id number]  CC: Hypertension; Osteoarthritis; and Back Pain   HPI [name] presents for f/up - She complains that her OAB medication that she is currently taking is not effective and is too expensive.  She complains that she still gets up 3-4 times a night to urinate.  She also has urinary frequency throughout the day.  She denies any recent episodes of dysuria, hematuria, urgency, or bladder pain.  She also complains of fatigue, weight loss, and mild nausea.  She denies abdominal pain, vomiting, fever, or chills.  She complains of chronic, disabling pain and wants a refill of Percocet.  Outpatient Medications Prior to Visit  Medication Sig Dispense Refill  . calcium carbonate (OS-CAL) 600 MG TABS tablet Take 600 mg by mouth 2 (two) times daily with a meal.    . carvedilol (COREG) 6.25 MG tablet take 1 tablet by mouth twice a day with meals 60 tablet 11  . chlorthalidone (HYGROTON) 25 MG tablet Take 1 tablet (25 mg total) by mouth daily. 90 tablet 0  . clonazePAM (KLONOPIN) 1 MG tablet Take 1 tablet (1 mg total) by mouth 2 (two) times daily as needed for anxiety. 60 tablet 5  . hydrALAZINE (APRESOLINE) 50 MG tablet Take 1 tablet (50 mg total) 3 (three) times daily by mouth. 270 tablet 1  . isosorbide mononitrate (ISMO,MONOKET) 20 MG tablet Take 1 tablet (20 mg total) by mouth 2 (two) times daily at 10 AM and 5 PM. 180 tablet 3  . omeprazole (PRILOSEC) 40 MG capsule Take 1 capsule (40 mg total) by mouth daily. 90 capsule 1  . potassium chloride (K-DUR) 10 MEQ tablet Take 1 tablet (10 mEq total) by mouth 3 (three) times daily. 270 tablet 0  . temazepam (RESTORIL) 15 MG capsule   0  . traZODone (DESYREL) 100 MG tablet Take 2 tablets (200 mg total) by mouth at bedtime. 180 tablet 1  . cholecalciferol (VITAMIN D) 1000 units tablet Take 2,000 Units by mouth daily.    Marland Kitchen MYRBETRIQ 25 MG TB24 tablet take 1 tablet  by mouth once daily 30 tablet 8  . oxyCODONE-acetaminophen (PERCOCET) 10-325 MG tablet Take 1 tablet by mouth every 8 (eight) hours as needed for pain. 90 tablet 0  . DULoxetine (CYMBALTA) 30 MG capsule 1  qam  For  1 week then 2  qam (Patient not taking: Reported on 10/30/2017) 60 capsule 5  . magnesium citrate SOLN Take 296 mLs (1 Bottle total) by mouth daily as needed for moderate constipation or severe constipation. 3 Bottle 3  . Magnesium Oxide 400 (240 Mg) MG TABS Take 1 tablet (400 mg total) by mouth daily. 90 tablet 0  . naloxegol oxalate (MOVANTIK) 12.5 MG TABS tablet Take 1 tablet (12.5 mg total) by mouth daily. 30 tablet 0   No facility-administered medications prior to visit.     ROS Review of Systems  Constitutional: Positive for fatigue and unexpected weight change. Negative for appetite change, chills, diaphoresis and fever.  HENT: Negative.   Eyes: Negative for visual disturbance.  Respiratory: Negative for cough, chest tightness, shortness of breath and wheezing.   Cardiovascular: Negative.  Negative for chest pain, palpitations and leg swelling.  Gastrointestinal: Positive for nausea. Negative for abdominal pain, constipation, diarrhea and vomiting.  Endocrine: Positive for polyuria. Negative for polydipsia and polyphagia.  Genitourinary: Positive for frequency. Negative for decreased urine volume, difficulty  urinating, dysuria, flank pain, hematuria and urgency.  Musculoskeletal: Positive for arthralgias and back pain. Negative for myalgias.  Skin: Negative.  Negative for color change and rash.  Neurological: Negative.  Negative for dizziness, weakness and light-headedness.  Hematological: Negative for adenopathy. Does not bruise/bleed easily.  Psychiatric/Behavioral: Negative.  Negative for behavioral problems, confusion, decreased concentration, dysphoric mood and suicidal ideas. The patient is not nervous/anxious and is not hyperactive.     Objective:  BP 120/70 (BP  Location: Left Arm, Patient Position: Sitting, Cuff Size: Large)   Pulse 72   Temp 98.3 F (36.8 C) (Oral)   Ht 5\' 2"  (1.575 m)   Wt 193 lb 8 oz (87.8 kg)   SpO2 94%   BMI 35.39 kg/m   BP Readings from Last 3 Encounters:  03/30/18 120/70  01/19/18 126/78  12/17/17 (!) 154/90    Wt Readings from Last 3 Encounters:  03/30/18 193 lb 8 oz (87.8 kg)  01/19/18 206 lb (93.4 kg)  12/17/17 206 lb (93.4 kg)    Physical Exam  Constitutional: She is oriented to person, place, and time. No distress.  HENT:  Mouth/Throat: Oropharynx is clear and moist. No oropharyngeal exudate.  Eyes: Conjunctivae are normal. No scleral icterus.  Neck: Normal range of motion. Neck supple. No JVD present.  Cardiovascular: Normal rate, regular rhythm and normal heart sounds. Exam reveals no gallop and no friction rub.  No murmur heard. Pulmonary/Chest: Effort normal and breath sounds normal. No stridor. No respiratory distress. She has no wheezes. She has no rales.  Abdominal: Soft. Bowel sounds are normal. She exhibits no distension and no mass. There is no tenderness.  Musculoskeletal: Normal range of motion. She exhibits no edema, tenderness or deformity.  Lymphadenopathy:    She has no cervical adenopathy.  Neurological: She is alert and oriented to person, place, and time.  Skin: Skin is warm and dry. She is not diaphoretic.  Psychiatric: She has a normal mood and affect. Her behavior is normal. Judgment and thought content normal.  Vitals reviewed.   Lab Results  Component Value Date   WBC 7.3 03/30/2018   HGB 13.2 03/30/2018   HCT 39.5 03/30/2018   PLT 162.0 03/30/2018   GLUCOSE 94 03/30/2018   CHOL 229 (H) 06/12/2017   TRIG 233.0 (H) 06/12/2017   HDL 63.30 06/12/2017   LDLDIRECT 121.0 06/12/2017   LDLCALC 106 (H) 05/16/2015   ALT 7 03/30/2018   AST 12 03/30/2018   NA 136 03/30/2018   K 3.4 (L) 03/30/2018   CL 98 03/30/2018   CREATININE 0.66 03/30/2018   BUN 13 03/30/2018   CO2 30  03/30/2018   TSH 2.87 03/30/2018   INR 1.01 03/10/2015   HGBA1C 5.3 10/27/2017    Dg Abd 2 Views  Result Date: 01/19/2018 CLINICAL DATA:  Nausea, constipation. EXAM: ABDOMEN - 2 VIEW COMPARISON:  CT 05/20/2014 FINDINGS: Prior cholecystectomy. Moderate stool burden throughout the colon. Nonobstructive bowel gas pattern. No free air organomegaly. IMPRESSION: Prior cholecystectomy. Moderate stool burden throughout the colon. No acute findings. Electronically Signed   By: Rolm Baptise M.D.   On: 01/19/2018 20:38    Assessment & Plan:   [name] was seen today for hypertension, osteoarthritis and back pain.  Diagnoses and all orders for this visit:  Essential hypertension- Her blood pressure is well controlled.  Electrolytes are stable, renal function is normal. -     CBC with Differential/Platelet; Future -     Thyroid Panel With TSH; Future  Adrenal  incidentaloma ([place])- There are no apparent complications related to this.  Hypokalemia- Her potassium level remains mildly low at 3.4.  I have asked to be more compliant with the potassium replacement therapy. -     Comprehensive metabolic panel; Future  OAB (overactive bladder)- I will check her urine for infection and will change from Myrbetriq to [place]. -     fesoterodine (TOVIAZ) 4 MG TB24 tablet; Take 1 tablet (4 mg total) by mouth daily. -     Urinalysis, Routine w reflex microscopic; Future -     CULTURE, URINE COMPREHENSIVE; Future  Hashimoto's thyroiditis-Her TFTs are normal.  I do not think this is causing any of her current symptoms. -     Thyroid Panel With TSH; Future  Vitamin D deficiency-her vitamin D level remains low.  Have asked her increase her vitamin D supplement. -     VITAMIN D 25 Hydroxy (Vit-D Deficiency, Fractures); Future -     cyanocobalamin 2000 MCG tablet; Take 2 tablets (4,000 mcg total) by mouth daily.  Lumbar radiculopathy -     oxyCODONE-acetaminophen (PERCOCET) 10-325 MG tablet; Take 1 tablet by mouth  every 8 (eight) hours as needed for pain.  Primary osteoarthritis of right knee -     oxyCODONE-acetaminophen (PERCOCET) 10-325 MG tablet; Take 1 tablet by mouth every 8 (eight) hours as needed for pain.  Lumbar spondylosis -     oxyCODONE-acetaminophen (PERCOCET) 10-325 MG tablet; Take 1 tablet by mouth every 8 (eight) hours as needed for pain.   I have discontinued [facility] H. Kowalewski's cholecalciferol, MYRBETRIQ, DULoxetine, Magnesium Oxide, magnesium citrate, and naloxegol oxalate. I am also having her start on fesoterodine and cyanocobalamin. Additionally, I am having her maintain her calcium carbonate, isosorbide mononitrate, carvedilol, hydrALAZINE, clonazePAM, temazepam, traZODone, potassium chloride, chlorthalidone, omeprazole, and oxyCODONE-acetaminophen.  Meds ordered this encounter  Medications  . fesoterodine (TOVIAZ) 4 MG TB24 tablet    Sig: Take 1 tablet (4 mg total) by mouth daily.    Dispense:  90 tablet    Refill:  1  . oxyCODONE-acetaminophen (PERCOCET) 10-325 MG tablet    Sig: Take 1 tablet by mouth every 8 (eight) hours as needed for pain.    Dispense:  90 tablet    Refill:  0  . cyanocobalamin 2000 MCG tablet    Sig: Take 2 tablets (4,000 mcg total) by mouth daily.    Dispense:  180 tablet    Refill:  1     Follow-up: Return in about 3 months (around 06/30/2018).  Scarlette Calico, MD

## 2018-03-31 ENCOUNTER — Encounter: Payer: Self-pay | Admitting: Internal Medicine

## 2018-03-31 LAB — THYROID PANEL WITH TSH
Free Thyroxine Index: 1.5 (ref 1.4–3.8)
T3 UPTAKE: 31 % (ref 22–35)
T4 TOTAL: 4.7 ug/dL — AB (ref 5.1–11.9)
TSH: 2.87 mIU/L (ref 0.40–4.50)

## 2018-03-31 LAB — CULTURE, URINE COMPREHENSIVE
MICRO NUMBER: 90549321
SPECIMEN QUALITY: ADEQUATE

## 2018-03-31 MED ORDER — CHOLECALCIFEROL 50 MCG (2000 UT) PO TABS: 2.0000 | ORAL_TABLET | Freq: Every day | ORAL | 1 refills | Status: AC

## 2018-03-31 MED ORDER — OXYCODONE-ACETAMINOPHEN 10-325 MG PO TABS: 1.0000 | ORAL_TABLET | Freq: Three times a day (TID) | ORAL | 0 refills | Status: DC | PRN

## 2018-03-31 MED ORDER — CYANOCOBALAMIN 2000 MCG PO TABS: 4000.0000 ug | ORAL_TABLET | Freq: Every day | ORAL | 1 refills | Status: DC

## 2018-04-24 ENCOUNTER — Other Ambulatory Visit: Payer: Self-pay | Admitting: Internal Medicine

## 2018-04-24 DIAGNOSIS — F411 Generalized anxiety disorder: Secondary | ICD-10-CM

## 2018-04-27 ENCOUNTER — Other Ambulatory Visit: Payer: Self-pay | Admitting: Internal Medicine

## 2018-04-27 DIAGNOSIS — M47816 Spondylosis without myelopathy or radiculopathy, lumbar region: Secondary | ICD-10-CM

## 2018-04-27 DIAGNOSIS — M5416 Radiculopathy, lumbar region: Secondary | ICD-10-CM

## 2018-04-27 DIAGNOSIS — M1711 Unilateral primary osteoarthritis, right knee: Secondary | ICD-10-CM

## 2018-04-27 NOTE — Telephone Encounter (Signed)
Copied from [place] [id number]. Topic: Quick Communication - Rx Refill/Question >> Apr 27, 2018  2:56 PM Percell Belt A wrote: Medication: oxyCODONE-acetaminophen (PERCOCET) 10-325 MG tablet [[id number]]   Has the patient contacted their pharmacy? no (Agent: If no, request that the patient contact the pharmacy for the refill.) (Agent: If yes, when and what did the pharmacy advise?)  Preferred Pharmacy (with phone number or street name): [facility] on [address]   Agent: Please be advised that RX refills may take up to 3 business days. We ask that you follow-up with your pharmacy.

## 2018-04-28 NOTE — Telephone Encounter (Signed)
Oxycodone refill Last Refill:03/31/18 #90 Last OV: 03/30/18 PCP: Dr. Ronnald Ramp Pharmacy: [facility] on [address]

## 2018-04-29 MED ORDER — OXYCODONE-ACETAMINOPHEN 10-325 MG PO TABS: 1.0000 | ORAL_TABLET | Freq: Three times a day (TID) | ORAL | 0 refills | Status: DC | PRN

## 2018-05-05 ENCOUNTER — Other Ambulatory Visit: Payer: Self-pay | Admitting: Internal Medicine

## 2018-05-15 ENCOUNTER — Other Ambulatory Visit: Payer: Self-pay | Admitting: Family

## 2018-05-15 ENCOUNTER — Ambulatory Visit (INDEPENDENT_AMBULATORY_CARE_PROVIDER_SITE_OTHER): Payer: Medicare HMO | Admitting: Family

## 2018-05-15 ENCOUNTER — Other Ambulatory Visit: Payer: Medicare HMO

## 2018-05-15 ENCOUNTER — Encounter: Payer: Self-pay | Admitting: Family

## 2018-05-15 VITALS — BP 122/68 | HR 66 | Temp 98.0°F | Ht 62.0 in | Wt 198.0 lb

## 2018-05-15 DIAGNOSIS — R300 Dysuria: Secondary | ICD-10-CM

## 2018-05-15 LAB — POC URINALSYSI DIPSTICK (AUTOMATED)
Bilirubin, UA: NEGATIVE
Glucose, UA: NEGATIVE
KETONES UA: NEGATIVE
Leukocytes, UA: NEGATIVE
Nitrite, UA: NEGATIVE
PROTEIN UA: NEGATIVE
RBC UA: NEGATIVE
SPEC GRAV UA: 1.01 (ref 1.010–1.025)
UROBILINOGEN UA: 0.2 U/dL
pH, UA: 7 (ref 5.0–8.0)

## 2018-05-15 MED ORDER — SULFAMETHOXAZOLE-TRIMETHOPRIM 800-160 MG PO TABS: 1.0000 | ORAL_TABLET | Freq: Two times a day (BID) | ORAL | 0 refills | Status: DC

## 2018-05-15 MED ORDER — ESTROGENS, CONJUGATED 0.625 MG/GM VA CREA: 1.0000 | TOPICAL_CREAM | Freq: Every day | VAGINAL | 1 refills | Status: DC

## 2018-05-15 NOTE — Progress Notes (Signed)
[name] is a 84 y.o. female with the following history as recorded in EpicCare:  Patient Active Problem List   Diagnosis Date Noted  . Polypharmacy 01/19/2018  . Hypomagnesemia 12/18/2017  . Major depressive disorder, recurrent episode with anxious distress ([place]) 02/04/2017  . Dementia arising in the senium and presenium 06/27/2016  . Obesity, Class II, BMI 35-39.9, with comorbidity 05/15/2016  . Insomnia w/ sleep apnea 10/16/2015  . IBS (irritable bowel syndrome) 02/27/2015  . Lumbar spondylosis 02/10/2015  . Lumbar radiculopathy 02/10/2015  . Osteoarthritis of right knee 02/10/2015  . Hashimoto's thyroiditis 05/05/2014  . Hyperglycemia 02/21/2014  . Adrenal incidentaloma ([place]) 11/04/2013  . Benign paroxysmal positional vertigo 07/15/2013  . Aortic stenosis, mild 07/14/2013  . Hypokalemia 04/16/2013  . OAB (overactive bladder) 02/24/2013  . GERD with stricture 12/24/2012  . Sleep-related hypoventilation 07/30/2012  . OSA (obstructive sleep apnea) 04/16/2012  . Osteopenia 04/16/2012  . Preventative health care 05/24/2011  . Vitamin D deficiency 12/04/2010  . Hyperlipidemia with target LDL less than 130 12/03/2010  . GAD (generalized anxiety disorder) 12/03/2010  . Essential hypertension 12/03/2010    Current Outpatient Medications  Medication Sig Dispense Refill  . calcium carbonate (OS-CAL) 600 MG TABS tablet Take 600 mg by mouth 2 (two) times daily with a meal.    . carvedilol (COREG) 6.25 MG tablet take 1 tablet by mouth twice a day with meals 60 tablet 11  . chlorthalidone (HYGROTON) 25 MG tablet Take 1 tablet (25 mg total) by mouth daily. 90 tablet 0  . Cholecalciferol 2000 units TABS Take 2 tablets (4,000 Units total) by mouth daily. 180 tablet 1  . clonazePAM (KLONOPIN) 1 MG tablet TAKE 1 TABLET BY MOUTH TWICE A DAY IF NEEDED FOR ANXIETY 60 tablet 3  . hydrALAZINE (APRESOLINE) 50 MG tablet Take 1 tablet (50 mg total) 3 (three) times daily by mouth. 270 tablet 1  .  isosorbide mononitrate (ISMO,MONOKET) 20 MG tablet Take 1 tablet (20 mg total) by mouth 2 (two) times daily at 10 AM and 5 PM. 180 tablet 3  . MYRBETRIQ 25 MG TB24 tablet TAKE 1 TABLET BY MOUTH ONCE DAILY 90 tablet 1  . omeprazole (PRILOSEC) 40 MG capsule Take 1 capsule (40 mg total) by mouth daily. 90 capsule 1  . oxyCODONE-acetaminophen (PERCOCET) 10-325 MG tablet Take 1 tablet by mouth every 8 (eight) hours as needed for pain. 90 tablet 0  . potassium chloride (K-DUR) 10 MEQ tablet Take 1 tablet (10 mEq total) by mouth 3 (three) times daily. 270 tablet 0  . traZODone (DESYREL) 100 MG tablet Take 2 tablets (200 mg total) by mouth at bedtime. 180 tablet 1  . conjugated estrogens (PREMARIN) vaginal cream Place 1 Applicatorful vaginally daily. 42.5 g 1  . fesoterodine (TOVIAZ) 4 MG TB24 tablet Take 1 tablet (4 mg total) by mouth daily. (Patient not taking: Reported on 05/15/2018) 90 tablet 1  . sulfamethoxazole-trimethoprim (BACTRIM DS,SEPTRA DS) 800-160 MG tablet Take 1 tablet by mouth 2 (two) times daily. 10 tablet 0  . temazepam (RESTORIL) 15 MG capsule   0   No current facility-administered medications for this visit.     Allergies: Amlodipine; Enalapril; Losartan potassium; Morphine and related; and Metformin  Past Medical History:  Diagnosis Date  . Abdominal pain   . Anemia    NOS  . Anxiety   . Back pain   . Bruises easily   . Chronic diastolic CHF (congestive heart failure) ([place])    a. 06/2013  EF 65-70%.  . Chronic headaches    HISTORY OF   . Depression   . Diabetes mellitus    type II  DIET CONTROLLED  . Diarrhea   . Hepatitis    AGE 30S  . Hyperlipidemia   . Hypertension   . Mild aortic stenosis    a. 06/2013 Echo: EF 65-70%, mod LVH with focal basal hypertrophy, very mild AS.  Marland Kitchen Osteoarthritis   . Oxygen desaturation during sleep    USES 2 LITERS BEDTIME   VIA CPAP 06/2012 [facility]   . Shortness of breath    WITH EXERTION USES 2 L O2 BEDTIME  . Sleep apnea     CPAP WITH O2 2 LITERS 2013 ([facility])  . Wears dentures   . Wears glasses     Past Surgical History:  Procedure Laterality Date  . ABDOMINAL HYSTERECTOMY    . APPENDECTOMY    . CHOLECYSTECTOMY    . JOINT REPLACEMENT     LEFT KNEE   . KNEE ARTHROPLASTY Right 03/22/2015   Procedure: COMPUTER ASSISTED TOTAL KNEE ARTHROPLASTY;  Surgeon: Marybelle Killings, MD;  Location: [place];  Service: Orthopedics;  Laterality: Right;  . KNEE ARTHROSCOPY     RIGHT  . LEFT AND RIGHT HEART CATHETERIZATION WITH CORONARY ANGIOGRAM N/A 10/28/2013   Procedure: LEFT AND RIGHT HEART CATHETERIZATION WITH CORONARY ANGIOGRAM;  Surgeon: Blane Ohara, MD;  Location: [facility] CATH LAB;  Service: Cardiovascular;  Laterality: N/A;  . LEFT HEART CATHETERIZATION WITH CORONARY ANGIOGRAM N/A 11/14/2014   Procedure: LEFT HEART CATHETERIZATION WITH CORONARY ANGIOGRAM;  Surgeon: Josue Hector, MD;  Location[facility] CATH LAB;  Service: Cardiovascular;  Laterality: N/A;  . REVISION TOTAL KNEE ARTHROPLASTY  2011  . ROTATOR CUFF REPAIR  2010  . SHOULDER ARTHROSCOPY  09/17/2012   Procedure: ARTHROSCOPY SHOULDER;  Surgeon: Sharmon Revere, MD;  Location: [place];  Service: Orthopedics;  Laterality: Left;  . TONSILLECTOMY    . TONSILLECTOMY      Family History  Problem Relation Age of Onset  . Cancer Father        prostate cancer  . Heart disease Sister   . Heart disease Brother   . Alcohol abuse Other   . Hypertension Other   . Kidney disease Other   . Mental illness Other   . Emphysema Brother     Social History   Tobacco Use  . Smoking status: Never Smoker  . Smokeless tobacco: Never Used  Substance Use Topics  . Alcohol use: No    Subjective:  Patient presents with concerns for urinary burning x 2 days; does not feel she has UTI; is requesting refill on Premarin cream- uses this with good success with similar episodes in the past; no fever, no blood in urine; prefers not to take antibiotic unless necessary.   Objective:  Vitals:    05/15/18 1131  BP: 122/68  Pulse: 66  Temp: 98 F (36.7 C)  TempSrc: Oral  SpO2: 95%  Weight: 198 lb (89.8 kg)  Height: 5\' 2"  (1.575 m)    General: Well developed, well nourished, in no acute distress  Skin : Warm and dry.  Head: Normocephalic and atraumatic  Lungs: Respirations unlabored; clear to auscultation bilaterally without wheeze, rales, rhonchi  CVS exam: normal rate and regular rhythm.  Neurologic: Alert and oriented; speech intact; face symmetrical; moves all extremities well; CNII-XII intact without focal deficit  Assessment:  1. Dysuria     Plan:  U/A in office  is normal; check urine culture; agree that lack of estrogen could be contributing factor; Refill given on Premarin cream; if no benefit with this cream in 1-2 days, will have her start oral antibiotic; follow-up to be determined.   No follow-ups on file.  Orders Placed This Encounter  Procedures  . POCT Urinalysis Dipstick (Automated)    Requested Prescriptions   Signed Prescriptions Disp Refills  . conjugated estrogens (PREMARIN) vaginal cream 42.5 g 1    Sig: Place 1 Applicatorful vaginally daily.  Marland Kitchen sulfamethoxazole-trimethoprim (BACTRIM DS,SEPTRA DS) 800-160 MG tablet 10 tablet 0    Sig: Take 1 tablet by mouth 2 (two) times daily.

## 2018-05-16 LAB — URINE CULTURE
MICRO NUMBER: 90745320
SPECIMEN QUALITY: ADEQUATE

## 2018-05-27 ENCOUNTER — Other Ambulatory Visit: Payer: Self-pay | Admitting: Internal Medicine

## 2018-05-27 DIAGNOSIS — I10 Essential (primary) hypertension: Secondary | ICD-10-CM

## 2018-05-27 DIAGNOSIS — F339 Major depressive disorder, recurrent, unspecified: Secondary | ICD-10-CM

## 2018-06-01 ENCOUNTER — Other Ambulatory Visit: Payer: Self-pay | Admitting: Internal Medicine

## 2018-06-01 DIAGNOSIS — M1711 Unilateral primary osteoarthritis, right knee: Secondary | ICD-10-CM

## 2018-06-01 DIAGNOSIS — M47816 Spondylosis without myelopathy or radiculopathy, lumbar region: Secondary | ICD-10-CM

## 2018-06-01 DIAGNOSIS — M5416 Radiculopathy, lumbar region: Secondary | ICD-10-CM

## 2018-06-01 MED ORDER — OXYCODONE-ACETAMINOPHEN 10-325 MG PO TABS: 1.0000 | ORAL_TABLET | Freq: Three times a day (TID) | ORAL | 0 refills | Status: DC | PRN

## 2018-06-01 NOTE — Telephone Encounter (Signed)
Refill of Percocet  LRF 04/29/18  #90  0 refills  LOV 05/15/18 L. [place]  [facility] on [address]

## 2018-06-01 NOTE — Telephone Encounter (Signed)
Copied from [place] [id number]. Topic: Quick Communication - Rx Refill/Question >> Jun 01, 2018  8:50 AM Synthia Innocent wrote: Medication: oxyCODONE-acetaminophen (PERCOCET) 10-325 MG tablet   Has the patient contacted their pharmacy? Yes.   (Agent: If no, request that the patient contact the pharmacy for the refill.) (Agent: If yes, when and what did the pharmacy advise?)  Preferred Pharmacy (with phone number or street name): [facility] on [address]  Agent: Please be advised that RX refills may take up to 3 business days. We ask that you follow-up with your pharmacy.

## 2018-06-08 ENCOUNTER — Ambulatory Visit: Payer: Self-pay | Admitting: *Deleted

## 2018-06-08 NOTE — Telephone Encounter (Signed)
[name] phoned. Her son was found dead on Mar 22, 2023 and she is having a very hard time just coping. She has her daughter who is her support person at this time. The son's body was just released to the funeral home this morning. She does not have any thoughts of harming herself. She is on Clonazepam 1 MG that she takes twice a day. She is asking for something in addition or maybe an increased dose to help her through this week.   Please advise.  [facility] on [address].   Her daughter is Caren Griffins.

## 2018-06-30 ENCOUNTER — Ambulatory Visit: Payer: Self-pay | Admitting: Internal Medicine

## 2018-07-02 ENCOUNTER — Ambulatory Visit (INDEPENDENT_AMBULATORY_CARE_PROVIDER_SITE_OTHER): Payer: Medicare HMO | Admitting: Internal Medicine

## 2018-07-02 ENCOUNTER — Other Ambulatory Visit (INDEPENDENT_AMBULATORY_CARE_PROVIDER_SITE_OTHER): Payer: Medicare HMO

## 2018-07-02 ENCOUNTER — Encounter: Payer: Self-pay | Admitting: Internal Medicine

## 2018-07-02 ENCOUNTER — Ambulatory Visit (INDEPENDENT_AMBULATORY_CARE_PROVIDER_SITE_OTHER)
Admission: RE | Admit: 2018-07-02 | Discharge: 2018-07-02 | Disposition: A | Discharge status: Home / Self Care | Payer: Medicare HMO | Source: Ambulatory Visit | Attending: Internal Medicine | Admitting: Internal Medicine

## 2018-07-02 VITALS — BP 150/90 | HR 63 | Temp 98.0°F | Ht 62.0 in | Wt 191.2 lb

## 2018-07-02 DIAGNOSIS — M1711 Unilateral primary osteoarthritis, right knee: Secondary | ICD-10-CM

## 2018-07-02 DIAGNOSIS — M47816 Spondylosis without myelopathy or radiculopathy, lumbar region: Secondary | ICD-10-CM

## 2018-07-02 DIAGNOSIS — E785 Hyperlipidemia, unspecified: Secondary | ICD-10-CM | POA: Diagnosis not present

## 2018-07-02 DIAGNOSIS — S3992XA Unspecified injury of lower back, initial encounter: Secondary | ICD-10-CM | POA: Diagnosis not present

## 2018-07-02 DIAGNOSIS — I10 Essential (primary) hypertension: Secondary | ICD-10-CM | POA: Diagnosis not present

## 2018-07-02 DIAGNOSIS — E063 Autoimmune thyroiditis: Secondary | ICD-10-CM

## 2018-07-02 DIAGNOSIS — M5416 Radiculopathy, lumbar region: Secondary | ICD-10-CM

## 2018-07-02 DIAGNOSIS — E876 Hypokalemia: Secondary | ICD-10-CM

## 2018-07-02 LAB — BASIC METABOLIC PANEL
BUN: 8 mg/dL (ref 6–23)
CHLORIDE: 100 meq/L (ref 96–112)
CO2: 33 mEq/L — ABNORMAL HIGH (ref 19–32)
CREATININE: 0.68 mg/dL (ref 0.40–1.20)
Calcium: 9.6 mg/dL (ref 8.4–10.5)
GFR: 106.17 mL/min (ref >60.00)
Glucose, Bld: 102 mg/dL — ABNORMAL HIGH (ref 70–99)
Potassium: 3.8 mEq/L (ref 3.5–5.1)
Sodium: 138 mEq/L (ref 135–145)

## 2018-07-02 LAB — TSH: TSH: 3.01 u[IU]/mL (ref 0.35–4.50)

## 2018-07-02 LAB — CBC WITH DIFFERENTIAL/PLATELET
BASOS PCT: 0.5 % (ref 0.0–3.0)
Basophils Absolute: 0 10*3/uL (ref 0.0–0.1)
EOS PCT: 1.7 % (ref 0.0–5.0)
Eosinophils Absolute: 0.2 10*3/uL (ref 0.0–0.7)
HEMATOCRIT: 42.8 % (ref 36.0–46.0)
HEMOGLOBIN: 14 g/dL (ref 12.0–15.0)
LYMPHS PCT: 19.1 % (ref 12.0–46.0)
Lymphs Abs: 2 10*3/uL (ref 0.7–4.0)
MCHC: 32.8 g/dL (ref 30.0–36.0)
MCV: 93.4 fl (ref 78.0–100.0)
Monocytes Absolute: 0.5 10*3/uL (ref 0.1–1.0)
Monocytes Relative: 4.6 % (ref 3.0–12.0)
Neutro Abs: 7.6 10*3/uL (ref 1.4–7.7)
Neutrophils Relative %: 74.1 % (ref 43.0–77.0)
Platelets: 188 10*3/uL (ref 150.0–400.0)
RBC: 4.59 Mil/uL (ref 3.87–5.11)
RDW: 14 % (ref 11.5–15.5)
WBC: 10.2 10*3/uL (ref 4.0–10.5)

## 2018-07-02 LAB — LIPID PANEL
Cholesterol: 226 mg/dL — ABNORMAL HIGH (ref 0–200)
HDL: 60.5 mg/dL (ref >39.00)
LDL Cholesterol: 134 mg/dL — ABNORMAL HIGH (ref 0–99)
NonHDL: 165.23
Total CHOL/HDL Ratio: 4
Triglycerides: 155 mg/dL — ABNORMAL HIGH (ref 0.0–149.0)
VLDL: 31 mg/dL (ref 0.0–40.0)

## 2018-07-02 MED ORDER — OXYCODONE-ACETAMINOPHEN 10-325 MG PO TABS
1.0000 | ORAL_TABLET | Freq: Three times a day (TID) | ORAL | 0 refills | Status: DC | PRN
Start: 2018-07-02 — End: 2018-07-29

## 2018-07-02 NOTE — Patient Instructions (Signed)

## 2018-07-02 NOTE — Progress Notes (Signed)
Subjective:  Patient ID: [name], female    DOB: [birth year]  Age: 84 y.o. MRN: [id number]  CC: Back Pain; Hypertension; and Hyperlipidemia   HPI [name] presents for f/up - she tells me she fell in her home 1 week ago and since then has had back pain that is a little worse than her usual.  The back pain occasionally radiates into her right thigh but not beyond that.  She denies numbness, weakness, or tingling in her lower extremities.  Outpatient Medications Prior to Visit  Medication Sig Dispense Refill  . calcium carbonate (OS-CAL) 600 MG TABS tablet Take 600 mg by mouth 2 (two) times daily with a meal.    . carvedilol (COREG) 6.25 MG tablet take 1 tablet by mouth twice a day with meals 60 tablet 11  . chlorthalidone (HYGROTON) 25 MG tablet TAKE 1 TABLET BY MOUTH ONCE DAILY 90 tablet 0  . Cholecalciferol 2000 units TABS Take 2 tablets (4,000 Units total) by mouth daily. 180 tablet 1  . clonazePAM (KLONOPIN) 1 MG tablet TAKE 1 TABLET BY MOUTH TWICE A DAY IF NEEDED FOR ANXIETY 60 tablet 3  . conjugated estrogens (PREMARIN) vaginal cream Place 1 Applicatorful vaginally daily. 42.5 g 1  . hydrALAZINE (APRESOLINE) 50 MG tablet Take 1 tablet (50 mg total) 3 (three) times daily by mouth. 270 tablet 1  . MYRBETRIQ 25 MG TB24 tablet TAKE 1 TABLET BY MOUTH ONCE DAILY 90 tablet 1  . omeprazole (PRILOSEC) 40 MG capsule Take 1 capsule (40 mg total) by mouth daily. 90 capsule 1  . potassium chloride (K-DUR) 10 MEQ tablet Take 1 tablet (10 mEq total) by mouth 3 (three) times daily. 270 tablet 0  . temazepam (RESTORIL) 15 MG capsule   0  . traZODone (DESYREL) 100 MG tablet TAKE 2 TABLETS BY MOUTH AT BEDTIME 180 tablet 0  . fesoterodine (TOVIAZ) 4 MG TB24 tablet Take 1 tablet (4 mg total) by mouth daily. 90 tablet 1  . isosorbide mononitrate (ISMO,MONOKET) 20 MG tablet Take 1 tablet (20 mg total) by mouth 2 (two) times daily at 10 AM and 5 PM. 180 tablet 3  . oxyCODONE-acetaminophen (PERCOCET)  10-325 MG tablet Take 1 tablet by mouth every 8 (eight) hours as needed for pain. 90 tablet 0  . sulfamethoxazole-trimethoprim (BACTRIM DS,SEPTRA DS) 800-160 MG tablet Take 1 tablet by mouth 2 (two) times daily. 10 tablet 0   No facility-administered medications prior to visit.     ROS Review of Systems  Constitutional: Negative.  Negative for appetite change, diaphoresis, fatigue and unexpected weight change.  HENT: Negative.   Eyes: Negative for visual disturbance.  Respiratory: Negative for cough, chest tightness, shortness of breath and wheezing.   Cardiovascular: Negative for chest pain, palpitations and leg swelling.  Gastrointestinal: Positive for constipation. Negative for abdominal pain, diarrhea, nausea and vomiting.  Endocrine: Negative.  Negative for cold intolerance and heat intolerance.  Genitourinary: Negative.  Negative for difficulty urinating.  Musculoskeletal: Positive for arthralgias and back pain. Negative for myalgias.  Skin: Negative.  Negative for color change, pallor and rash.  Neurological: Negative.  Negative for dizziness, weakness and light-headedness.  Hematological: Negative for adenopathy. Does not bruise/bleed easily.  Psychiatric/Behavioral: Negative.     Objective:  BP (!) 150/90 (BP Location: Left Arm, Patient Position: Sitting, Cuff Size: Large)   Pulse 63   Temp 98 F (36.7 C) (Oral)   Ht 5\' 2"  (1.575 m)   Wt 191 lb 4 oz (  86.8 kg)   SpO2 93%   BMI 34.98 kg/m   BP Readings from Last 3 Encounters:  07/02/18 (!) 150/90  05/15/18 122/68  03/30/18 120/70    Wt Readings from Last 3 Encounters:  07/02/18 191 lb 4 oz (86.8 kg)  05/15/18 198 lb (89.8 kg)  03/30/18 193 lb 8 oz (87.8 kg)    Physical Exam  Constitutional: She is oriented to person, place, and time. No distress.  HENT:  Mouth/Throat: Oropharynx is clear and moist. No oropharyngeal exudate.  Eyes: Conjunctivae are normal. No scleral icterus.  Neck: Normal range of motion.  Neck supple. No JVD present. No thyromegaly present.  Cardiovascular: Normal rate, regular rhythm and normal heart sounds. Exam reveals no gallop and no friction rub.  No murmur heard. Pulmonary/Chest: Effort normal and breath sounds normal. She has no wheezes. She has no rales.  Abdominal: Soft. Bowel sounds are normal. She exhibits no mass. There is no hepatosplenomegaly. There is no tenderness.  Musculoskeletal: Normal range of motion. She exhibits no edema, tenderness or deformity.       Lumbar back: Normal. She exhibits normal range of motion, no tenderness, no bony tenderness, no swelling, no deformity and no pain.  Lymphadenopathy:    She has no cervical adenopathy.  Neurological: She is alert and oriented to person, place, and time. She has normal strength. She displays no atrophy and no tremor. No cranial nerve deficit or sensory deficit. She exhibits normal muscle tone. She displays a negative Romberg sign. She displays no seizure activity. Coordination and gait normal.  Neg SLR in BLE  Skin: Skin is warm and dry. She is not diaphoretic. No pallor.  Vitals reviewed.   Lab Results  Component Value Date   WBC 10.2 07/02/2018   HGB 14.0 07/02/2018   HCT 42.8 07/02/2018   PLT 188.0 07/02/2018   GLUCOSE 102 (H) 07/02/2018   CHOL 226 (H) 07/02/2018   TRIG 155.0 (H) 07/02/2018   HDL 60.50 07/02/2018   LDLDIRECT 121.0 06/12/2017   LDLCALC 134 (H) 07/02/2018   ALT 7 03/30/2018   AST 12 03/30/2018   NA 138 07/02/2018   K 3.8 07/02/2018   CL 100 07/02/2018   CREATININE 0.68 07/02/2018   BUN 8 07/02/2018   CO2 33 (H) 07/02/2018   TSH 3.01 07/02/2018   INR 1.01 03/10/2015   HGBA1C 5.3 10/27/2017    Dg Abd 2 Views  Result Date: 01/19/2018 CLINICAL DATA:  Nausea, constipation. EXAM: ABDOMEN - 2 VIEW COMPARISON:  CT 05/20/2014 FINDINGS: Prior cholecystectomy. Moderate stool burden throughout the colon. Nonobstructive bowel gas pattern. No free air organomegaly. IMPRESSION: Prior  cholecystectomy. Moderate stool burden throughout the colon. No acute findings. Electronically Signed   By: Rolm Baptise M.D.   On: 01/19/2018 20:38    Assessment & Plan:   Teah was seen today for back pain, hypertension and hyperlipidemia.  Diagnoses and all orders for this visit:  Hypokalemia- Her potassium level is normal now. -     Basic metabolic panel; Future  Hypomagnesemia  Hyperlipidemia with target LDL less than 130- Statin therapy is not indicated. -     Lipid panel; Future -     TSH; Future  Essential hypertension- Her blood pressure is well controlled.  Electrolytes and renal function are normal. -     CBC with Differential/Platelet; Future -     Basic metabolic panel; Future  Lumbar radiculopathy- She has slightly worsening pain after a fall.  Her plain x-ray is positive  for DDD but there is no evidence of fracture.  She is neurologically intact.  Will continue Percocet as needed for pain. -     oxyCODONE-acetaminophen (PERCOCET) 10-325 MG tablet; Take 1 tablet by mouth every 8 (eight) hours as needed for pain. -     DG Lumbar Spine Complete; Future  Primary osteoarthritis of right knee -     oxyCODONE-acetaminophen (PERCOCET) 10-325 MG tablet; Take 1 tablet by mouth every 8 (eight) hours as needed for pain.  Lumbar spondylosis -     oxyCODONE-acetaminophen (PERCOCET) 10-325 MG tablet; Take 1 tablet by mouth every 8 (eight) hours as needed for pain.  Hashimoto's thyroiditis- She is clinically euthyroid.   I have discontinued Teya H. Giraud's isosorbide mononitrate, fesoterodine, and sulfamethoxazole-trimethoprim. I am also having her maintain her calcium carbonate, carvedilol, hydrALAZINE, temazepam, potassium chloride, omeprazole, Cholecalciferol, clonazePAM, MYRBETRIQ, conjugated estrogens, traZODone, chlorthalidone, and oxyCODONE-acetaminophen.  Meds ordered this encounter  Medications  . oxyCODONE-acetaminophen (PERCOCET) 10-325 MG tablet    Sig: Take 1 tablet  by mouth every 8 (eight) hours as needed for pain.    Dispense:  90 tablet    Refill:  0     Follow-up: Return if symptoms worsen or fail to improve.  Scarlette Calico, MD

## 2018-07-04 ENCOUNTER — Encounter: Payer: Self-pay | Admitting: Internal Medicine

## 2018-07-06 ENCOUNTER — Ambulatory Visit: Payer: Self-pay | Admitting: Internal Medicine

## 2018-07-06 ENCOUNTER — Other Ambulatory Visit: Payer: Self-pay | Admitting: Internal Medicine

## 2018-07-06 DIAGNOSIS — K219 Gastro-esophageal reflux disease without esophagitis: Secondary | ICD-10-CM

## 2018-07-06 DIAGNOSIS — K222 Esophageal obstruction: Principal | ICD-10-CM

## 2018-07-06 MED ORDER — PROMETHAZINE HCL 12.5 MG PO TABS: 12.5000 mg | ORAL_TABLET | Freq: Four times a day (QID) | ORAL | 3 refills | Status: DC | PRN

## 2018-07-06 NOTE — Telephone Encounter (Signed)
Called in c/o abd pain that has been going on for several days.   I saw Dr. Ronnald Ramp this past Thursday.   He did an x-ray of my back because I fell.    I've had 3-4 mushy stools since 3:00am this morning.    "I think I need a colonoscopy for this abd pain".    She thinks it's from the oxycodone.   She only wants to see Dr. Ronnald Ramp.   When I let her know she needed to go to the ED or urgent care if her symptoms become worse or she starts vomiting, seeing blood in her stool, gets a fever or pain becomes worse.    She stated,   "I know you have to tell me that but I'm not going to the emergency room".    "That doctor don't know me".    "I want to see Dr. Ronnald Ramp".   "He knows me".   She di agree to go if she saw blood in her stool.  I made her a same day appt with Dr. Ronnald Ramp for 07/07/18 at 10:30.   I spoke with the flow coordinator to make sure it was ok to keep this same day appt since I scheduled her a day ahead.   She got approval to keep this appt.   Reason for Disposition . Age > 60 years  Answer Assessment - Initial Assessment Questions 1. LOCATION: "Where does it hurt?"      It hurts at the bottom of my abdomin.  It hurts below my navel.    I've been to the bathroom 3-4 time sthis morning since 3:00am.   It's mushy.  Not firm or diarrhea.  I feel like I'm straining.    2. RADIATION: "Does the pain shoot anywhere else?" (e.g., chest, back)     No.  3. ONSET: "When did the pain begin?" (e.g., minutes, hours or days ago)      I saw Dr. Ronnald Ramp Thursday.    He had me take an x-ray because I fell.  It was of my back. 4. SUDDEN: "Gradual or sudden onset?"     The abd pain has gotten worse. 5. PATTERN "Does the pain come and go, or is it constant?"    - If constant: "Is it getting better, staying the same, or worsening?"      (Note: Constant means the pain never goes away completely; most serious pain is constant and it progresses)     - If intermittent: "How long does it last?" "Do you have pain now?"    (Note: Intermittent means the pain goes away completely between bouts)     The pain is there most of the day.  I take oxycodone.   I think the oxycodone could be causing this.     6. SEVERITY: "How bad is the pain?"  (e.g., Scale 1-10; mild, moderate, or severe)   - MILD (1-3): doesn't interfere with normal activities, abdomen soft and not tender to touch    - MODERATE (4-7): interferes with normal activities or awakens from sleep, tender to touch    - SEVERE (8-10): excruciating pain, doubled over, unable to do any normal activities      8 on pain scale 7. RECURRENT SYMPTOM: "Have you ever had this type of abdominal pain before?" If so, ask: "When was the last time?" and "What happened that time?"      No. 8. CAUSE: "What do you think is causing the abdominal pain?"  I think it's the oxycodone.   9. RELIEVING/AGGRAVATING FACTORS: "What makes it better or worse?" (e.g., movement, antacids, bowel movement)     I tried Pepto Bismal.    It did not really help. 10. OTHER SYMPTOMS: "Has there been any vomiting, diarrhea, constipation, or urine problems?"       Denies urinary symptoms. 11. PREGNANCY: "Is there any chance you are pregnant?" "When was your last menstrual period?"       Not asked due to age.  Protocols used: ABDOMINAL PAIN [facility]

## 2018-07-07 ENCOUNTER — Ambulatory Visit (INDEPENDENT_AMBULATORY_CARE_PROVIDER_SITE_OTHER)
Admission: RE | Admit: 2018-07-07 | Discharge: 2018-07-07 | Disposition: A | Discharge status: Home / Self Care | Payer: Medicare HMO | Source: Ambulatory Visit | Attending: Internal Medicine | Admitting: Internal Medicine

## 2018-07-07 ENCOUNTER — Encounter: Payer: Self-pay | Admitting: Internal Medicine

## 2018-07-07 ENCOUNTER — Ambulatory Visit (INDEPENDENT_AMBULATORY_CARE_PROVIDER_SITE_OTHER): Payer: Medicare HMO | Admitting: Internal Medicine

## 2018-07-07 VITALS — BP 152/78 | HR 70 | Temp 98.6°F | Resp 18 | Ht 62.0 in | Wt 188.8 lb

## 2018-07-07 DIAGNOSIS — R109 Unspecified abdominal pain: Secondary | ICD-10-CM | POA: Diagnosis not present

## 2018-07-07 DIAGNOSIS — R10825 Periumbilic rebound abdominal tenderness: Secondary | ICD-10-CM

## 2018-07-07 MED ORDER — IOPAMIDOL (ISOVUE-300) INJECTION 61%
100.0000 mL | Freq: Once | INTRAVENOUS | Status: AC | PRN
  Administered 2018-07-07: 100 mL via INTRAVENOUS

## 2018-07-07 NOTE — Progress Notes (Addendum)
Subjective:  Patient ID: [name], female    DOB: 1933/01/24  Age: 84 y.o. MRN: [id number]  CC: Abdominal Pain   HPI [name] presents for f/up - She complains of a 2-week history of worsening abdominal pain that is mostly localized to the periumbilical region.  She describes it as a gnawing and achy sensation with nausea but no vomiting.  She has also noticed some episodes of melena but has been taking Pepto-Bismol.  The Pepto-Bismol has not helped with her symptoms.  She also complains of loss of appetite and weight loss.  Denies diarrhea or constipation.  Outpatient Medications Prior to Visit  Medication Sig Dispense Refill  . calcium carbonate (OS-CAL) 600 MG TABS tablet Take 600 mg by mouth 2 (two) times daily with a meal.    . carvedilol (COREG) 6.25 MG tablet take 1 tablet by mouth twice a day with meals 60 tablet 11  . chlorthalidone (HYGROTON) 25 MG tablet TAKE 1 TABLET BY MOUTH ONCE DAILY 90 tablet 0  . Cholecalciferol 2000 units TABS Take 2 tablets (4,000 Units total) by mouth daily. 180 tablet 1  . clonazePAM (KLONOPIN) 1 MG tablet TAKE 1 TABLET BY MOUTH TWICE A DAY IF NEEDED FOR ANXIETY 60 tablet 3  . conjugated estrogens (PREMARIN) vaginal cream Place 1 Applicatorful vaginally daily. 42.5 g 1  . hydrALAZINE (APRESOLINE) 50 MG tablet Take 1 tablet (50 mg total) 3 (three) times daily by mouth. 270 tablet 1  . MYRBETRIQ 25 MG TB24 tablet TAKE 1 TABLET BY MOUTH ONCE DAILY 90 tablet 1  . omeprazole (PRILOSEC) 40 MG capsule Take 1 capsule (40 mg total) by mouth daily. 90 capsule 1  . oxyCODONE-acetaminophen (PERCOCET) 10-325 MG tablet Take 1 tablet by mouth every 8 (eight) hours as needed for pain. 90 tablet 0  . potassium chloride (K-DUR) 10 MEQ tablet Take 1 tablet (10 mEq total) by mouth 3 (three) times daily. 270 tablet 0  . promethazine (PHENERGAN) 12.5 MG tablet Take 1 tablet (12.5 mg total) by mouth every 6 (six) hours as needed for nausea or vomiting. 30 tablet 3  .  temazepam (RESTORIL) 15 MG capsule   0  . traZODone (DESYREL) 100 MG tablet TAKE 2 TABLETS BY MOUTH AT BEDTIME 180 tablet 0   No facility-administered medications prior to visit.     ROS Review of Systems  Constitutional: Positive for appetite change and unexpected weight change. Negative for activity change, diaphoresis, fatigue and fever.  HENT: Negative.  Negative for trouble swallowing.   Eyes: Negative for visual disturbance.  Respiratory: Negative for cough, chest tightness, shortness of breath and wheezing.   Cardiovascular: Negative for chest pain, palpitations and leg swelling.  Gastrointestinal: Positive for abdominal pain and nausea. Negative for abdominal distention, blood in stool, constipation, diarrhea, rectal pain and vomiting.  Endocrine: Negative.   Genitourinary: Negative.  Negative for decreased urine volume, difficulty urinating, dysuria, frequency, hematuria, urgency, vaginal bleeding, vaginal discharge and vaginal pain.  Musculoskeletal: Positive for arthralgias and back pain.  Neurological: Negative.  Negative for dizziness, weakness, light-headedness and headaches.  Hematological: Negative for adenopathy. Does not bruise/bleed easily.  Psychiatric/Behavioral: Negative.     Objective:  BP (!) 152/78 (BP Location: Left Arm, Patient Position: Sitting, Cuff Size: Large)   Pulse 70   Temp 98.6 F (37 C) (Oral)   Resp 18   Ht 5\' 2"  (1.575 m)   Wt 188 lb 12.8 oz (85.6 kg)   SpO2 95%   BMI  34.53 kg/m   BP Readings from Last 3 Encounters:  07/07/18 (!) 152/78  07/02/18 (!) 150/90  05/15/18 122/68    Wt Readings from Last 3 Encounters:  07/07/18 188 lb 12.8 oz (85.6 kg)  07/02/18 191 lb 4 oz (86.8 kg)  05/15/18 198 lb (89.8 kg)    Physical Exam  Constitutional: She is oriented to person, place, and time.  Non-toxic appearance. She does not appear ill.  HENT:  Mouth/Throat: No oropharyngeal exudate.  Eyes: No scleral icterus.  Neck: Normal range of  motion. Neck supple. No thyromegaly present.  Cardiovascular: Normal rate, regular rhythm and normal heart sounds.  No murmur heard. Pulmonary/Chest: Effort normal and breath sounds normal. No respiratory distress. She has no wheezes. She has no rhonchi. She has no rales.  Abdominal: Soft. Normal appearance and bowel sounds are normal. There is no hepatosplenomegaly. There is tenderness in the periumbilical area and left lower quadrant. There is no rigidity, no rebound, no guarding, no CVA tenderness, no tenderness at McBurney's point and negative Murphy's sign. No hernia. Hernia confirmed negative in the right inguinal area and confirmed negative in the left inguinal area.  Genitourinary: Rectum normal. Rectal exam shows no external hemorrhoid, no internal hemorrhoid, no fissure, no mass, no tenderness, anal tone normal and guaiac negative stool. Pelvic exam was performed with patient supine.  Musculoskeletal: Normal range of motion. She exhibits no edema, tenderness or deformity.  Lymphadenopathy: No inguinal adenopathy noted on the right or left side.  Neurological: She is alert and oriented to person, place, and time.  Skin: Skin is warm and dry. No pallor.  Vitals reviewed.   Lab Results  Component Value Date   WBC 10.2 07/02/2018   HGB 14.0 07/02/2018   HCT 42.8 07/02/2018   PLT 188.0 07/02/2018   GLUCOSE 102 (H) 07/02/2018   CHOL 226 (H) 07/02/2018   TRIG 155.0 (H) 07/02/2018   HDL 60.50 07/02/2018   LDLDIRECT 121.0 06/12/2017   LDLCALC 134 (H) 07/02/2018   ALT 7 03/30/2018   AST 12 03/30/2018   NA 138 07/02/2018   K 3.8 07/02/2018   CL 100 07/02/2018   CREATININE 0.68 07/02/2018   BUN 8 07/02/2018   CO2 33 (H) 07/02/2018   TSH 3.01 07/02/2018   INR 1.01 03/10/2015   HGBA1C 5.3 10/27/2017    Dg Lumbar Spine Complete  Result Date: 07/03/2018 CLINICAL DATA:  Fall several days ago EXAM: LUMBAR SPINE - COMPLETE 4+ VIEW COMPARISON:  08/20/2017 FINDINGS: Five lumbar type  vertebral bodies are well visualized. Vertebral body height is well maintained. Mild anterolisthesis of L4 on L5 and L5 on S1 is noted. Facet hypertrophic changes are noted. No compression deformity is seen. No other focal abnormality is noted. IMPRESSION: Degenerative change with stable mild anterolisthesis as described. No acute abnormality noted. Electronically Signed   By: Inez Catalina M.D.   On: 07/03/2018 09:16    Assessment & Plan:   [name] was seen today for abdominal pain.  Diagnoses and all orders for this visit:  Periumbilical abdominal tenderness with rebound tenderness- I have asked her to undergo a stat CT scan of the abdomen and pelvis with contrast to see if there is pancreatitis, a malignancy, an abscess, ischemic bowel, small bowel obstruction, or omental infarction. -     CT Abdomen Pelvis W Contrast; Future   I am having [name] maintain her calcium carbonate, carvedilol, hydrALAZINE, temazepam, potassium chloride, omeprazole, Cholecalciferol, clonazePAM, MYRBETRIQ, conjugated estrogens, traZODone, chlorthalidone, oxyCODONE-acetaminophen, and  promethazine.  No orders of the defined types were placed in this encounter.    Follow-up: No follow-ups on file.  Lisa Calico, MD

## 2018-07-07 NOTE — Patient Instructions (Signed)

## 2018-07-08 ENCOUNTER — Telehealth: Payer: Self-pay | Admitting: *Deleted

## 2018-07-08 ENCOUNTER — Encounter: Payer: Self-pay | Admitting: Internal Medicine

## 2018-07-08 ENCOUNTER — Telehealth: Payer: Self-pay | Admitting: Internal Medicine

## 2018-07-08 NOTE — Telephone Encounter (Signed)
Copied from [place] [id number]. Topic: Quick Communication - See Telephone Encounter >> Jul 08, 2018 11:51 AM [name] wrote: CRM for notification. See Telephone encounter for: 07/08/18.  Patient would like to know what he results are from her xray that she had done yesterday on [address]. She also needs to know does she need to come back in? Please advise.

## 2018-07-08 NOTE — Telephone Encounter (Signed)
Promethazine 12.5 mg PA initiated via CoverMyMeds.  Key: [id number] - PA Case ID: [id number] - Rx #: [id number]

## 2018-07-09 ENCOUNTER — Other Ambulatory Visit: Payer: Self-pay | Admitting: Internal Medicine

## 2018-07-09 DIAGNOSIS — K838 Other specified diseases of biliary tract: Secondary | ICD-10-CM

## 2018-07-09 DIAGNOSIS — K589 Irritable bowel syndrome without diarrhea: Secondary | ICD-10-CM

## 2018-07-09 MED ORDER — GABAPENTIN 100 MG PO CAPS: 100.0000 mg | ORAL_CAPSULE | Freq: Three times a day (TID) | ORAL | 5 refills | Status: DC

## 2018-07-09 NOTE — Telephone Encounter (Signed)
Pt contacted and informed rx for gabapentin was sent in, PA for phenergan was approved and that a referral for GI was sent.

## 2018-07-09 NOTE — Telephone Encounter (Signed)
Patient stated she is not that much better, and she would like a prescription for Gabapentin sent to her preferred pharmacy [facility] on [address] because it helps her hip pain. She also stated she is looking for something to take for her bowel movements.

## 2018-07-09 NOTE — Telephone Encounter (Signed)
Spoke to pt dtr Bonnita Levan) - informed that referral was sent in for GI due to dilation of common bile duct, informed that we communicate through mychart the results of test and PCP wanted to know if she was still in pain. Caren Griffins requested to know why Dr. Ronnald Ramp did not call pt back himself. I explained that was one of my functions as his Environmental consultant. Caren Griffins and spoke about pt wanting an rx for gabapentin. Caren Griffins stated that pt has had that before and it helped with the stomach pain. Caren Griffins stated that pt is still taking the oxycodone but had thought she stopped taking it. Caren Griffins is interested in pt dc'ing oxycodone. Caren Griffins requested the information for our site Freight forward[employer]. I gave her [facility] office line and title as requested.

## 2018-07-09 NOTE — Telephone Encounter (Signed)
LVM for [name] to call back as soon as possible.   RE: [name] dtr Bonnita Levan) wanted to know what the next steps are for her mom ([name]). [name] is still in pain, not able to pick up the phenergan, rq rx for gabapentin and wants the abdominal pain fixed and not covered up.

## 2018-07-09 NOTE — Telephone Encounter (Signed)
Pt is requesting rx for gabapentin. Please advise

## 2018-07-12 ENCOUNTER — Other Ambulatory Visit: Payer: Self-pay | Admitting: Internal Medicine

## 2018-07-12 DIAGNOSIS — I10 Essential (primary) hypertension: Secondary | ICD-10-CM

## 2018-07-29 ENCOUNTER — Other Ambulatory Visit: Payer: Self-pay | Admitting: Internal Medicine

## 2018-07-29 DIAGNOSIS — M1711 Unilateral primary osteoarthritis, right knee: Secondary | ICD-10-CM

## 2018-07-29 DIAGNOSIS — M47816 Spondylosis without myelopathy or radiculopathy, lumbar region: Secondary | ICD-10-CM

## 2018-07-29 DIAGNOSIS — M5416 Radiculopathy, lumbar region: Secondary | ICD-10-CM

## 2018-07-29 NOTE — Telephone Encounter (Signed)
Refill of percocet  LOV 07/07/18 Dr. Ronnald Ramp  LRF 07/02/18  #90  0 refills    [facility] [place], [place]            [phone] (Phone) [phone] (Fax)

## 2018-07-29 NOTE — Telephone Encounter (Signed)
Copied from [place] [id number]. Topic: Quick Communication - Rx Refill/Question >> Jul 29, 2018  9:54 AM Cecelia Byars, NT wrote: Medication: oxyCODONE-acetaminophen (PERCOCET) 10-325 MG tablet  Has the patient contacted their pharmacy? yes (Agent: If no, request that the patient contact the pharmacy for the refill (Agent: If yes, when and what did the pharmacy advise?  Preferred Pharmacy (with phone number or street name  [facility] [phone], [address] AT [address] [phone] (Phone) [phone] (Fax)    Agent: Please be advised that RX refills may take up to 3 business days. We ask that you follow-up with your pharmacy.

## 2018-07-29 NOTE — Telephone Encounter (Signed)
Check [place] registry last filled 07/02/2018.Marland KitchenJohny Chess

## 2018-07-31 MED ORDER — OXYCODONE-ACETAMINOPHEN 10-325 MG PO TABS: 1.0000 | ORAL_TABLET | Freq: Three times a day (TID) | ORAL | 0 refills | Status: DC | PRN

## 2018-08-07 ENCOUNTER — Other Ambulatory Visit: Payer: Self-pay | Admitting: Internal Medicine

## 2018-08-07 DIAGNOSIS — F339 Major depressive disorder, recurrent, unspecified: Secondary | ICD-10-CM

## 2018-08-13 DIAGNOSIS — R1084 Generalized abdominal pain: Secondary | ICD-10-CM | POA: Diagnosis not present

## 2018-08-13 DIAGNOSIS — R110 Nausea: Secondary | ICD-10-CM | POA: Diagnosis not present

## 2018-08-24 ENCOUNTER — Other Ambulatory Visit: Payer: Self-pay | Admitting: Internal Medicine

## 2018-08-24 DIAGNOSIS — F411 Generalized anxiety disorder: Secondary | ICD-10-CM

## 2018-08-25 ENCOUNTER — Inpatient Hospital Stay (HOSPITAL_COMMUNITY)
Admission: EM | Admit: 2018-08-25 | Discharge: 2018-08-28 | DRG: 071 | Disposition: A | Discharge status: Home Health | Payer: Medicare HMO | Attending: Internal Medicine | Admitting: Internal Medicine

## 2018-08-25 ENCOUNTER — Emergency Department (HOSPITAL_COMMUNITY): Payer: Medicare HMO

## 2018-08-25 ENCOUNTER — Encounter (HOSPITAL_COMMUNITY): Payer: Self-pay | Admitting: Emergency Medicine

## 2018-08-25 ENCOUNTER — Other Ambulatory Visit: Payer: Self-pay

## 2018-08-25 ENCOUNTER — Telehealth: Payer: Self-pay | Admitting: Internal Medicine

## 2018-08-25 DIAGNOSIS — I350 Nonrheumatic aortic (valve) stenosis: Secondary | ICD-10-CM | POA: Diagnosis present

## 2018-08-25 DIAGNOSIS — Z79899 Other long term (current) drug therapy: Secondary | ICD-10-CM

## 2018-08-25 DIAGNOSIS — N3000 Acute cystitis without hematuria: Secondary | ICD-10-CM | POA: Diagnosis not present

## 2018-08-25 DIAGNOSIS — G9341 Metabolic encephalopathy: Secondary | ICD-10-CM | POA: Diagnosis present

## 2018-08-25 DIAGNOSIS — E119 Type 2 diabetes mellitus without complications: Secondary | ICD-10-CM | POA: Diagnosis present

## 2018-08-25 DIAGNOSIS — G4733 Obstructive sleep apnea (adult) (pediatric): Secondary | ICD-10-CM | POA: Diagnosis present

## 2018-08-25 DIAGNOSIS — Z885 Allergy status to narcotic agent status: Secondary | ICD-10-CM

## 2018-08-25 DIAGNOSIS — F0390 Unspecified dementia without behavioral disturbance: Secondary | ICD-10-CM | POA: Diagnosis present

## 2018-08-25 DIAGNOSIS — F419 Anxiety disorder, unspecified: Secondary | ICD-10-CM | POA: Diagnosis present

## 2018-08-25 DIAGNOSIS — K5730 Diverticulosis of large intestine without perforation or abscess without bleeding: Secondary | ICD-10-CM | POA: Diagnosis not present

## 2018-08-25 DIAGNOSIS — IMO0002 Reserved for concepts with insufficient information to code with codable children: Secondary | ICD-10-CM | POA: Diagnosis present

## 2018-08-25 DIAGNOSIS — F13239 Sedative, hypnotic or anxiolytic dependence with withdrawal, unspecified: Secondary | ICD-10-CM | POA: Diagnosis present

## 2018-08-25 DIAGNOSIS — F339 Major depressive disorder, recurrent, unspecified: Secondary | ICD-10-CM

## 2018-08-25 DIAGNOSIS — I110 Hypertensive heart disease with heart failure: Secondary | ICD-10-CM | POA: Diagnosis present

## 2018-08-25 DIAGNOSIS — Z6830 Body mass index (BMI) 30.0-30.9, adult: Secondary | ICD-10-CM

## 2018-08-25 DIAGNOSIS — R29810 Facial weakness: Secondary | ICD-10-CM | POA: Diagnosis present

## 2018-08-25 DIAGNOSIS — I6523 Occlusion and stenosis of bilateral carotid arteries: Secondary | ICD-10-CM | POA: Diagnosis not present

## 2018-08-25 DIAGNOSIS — Z8619 Personal history of other infectious and parasitic diseases: Secondary | ICD-10-CM

## 2018-08-25 DIAGNOSIS — K219 Gastro-esophageal reflux disease without esophagitis: Secondary | ICD-10-CM | POA: Diagnosis present

## 2018-08-25 DIAGNOSIS — J9811 Atelectasis: Secondary | ICD-10-CM | POA: Diagnosis not present

## 2018-08-25 DIAGNOSIS — E876 Hypokalemia: Secondary | ICD-10-CM | POA: Diagnosis present

## 2018-08-25 DIAGNOSIS — R569 Unspecified convulsions: Secondary | ICD-10-CM | POA: Diagnosis present

## 2018-08-25 DIAGNOSIS — E063 Autoimmune thyroiditis: Secondary | ICD-10-CM | POA: Diagnosis present

## 2018-08-25 DIAGNOSIS — Z888 Allergy status to other drugs, medicaments and biological substances status: Secondary | ICD-10-CM

## 2018-08-25 DIAGNOSIS — R4701 Aphasia: Secondary | ICD-10-CM | POA: Diagnosis present

## 2018-08-25 DIAGNOSIS — F329 Major depressive disorder, single episode, unspecified: Secondary | ICD-10-CM | POA: Diagnosis present

## 2018-08-25 DIAGNOSIS — I5032 Chronic diastolic (congestive) heart failure: Secondary | ICD-10-CM | POA: Diagnosis present

## 2018-08-25 DIAGNOSIS — N390 Urinary tract infection, site not specified: Secondary | ICD-10-CM | POA: Diagnosis present

## 2018-08-25 DIAGNOSIS — G4736 Sleep related hypoventilation in conditions classified elsewhere: Secondary | ICD-10-CM | POA: Diagnosis present

## 2018-08-25 DIAGNOSIS — I10 Essential (primary) hypertension: Secondary | ICD-10-CM | POA: Diagnosis present

## 2018-08-25 DIAGNOSIS — R4182 Altered mental status, unspecified: Secondary | ICD-10-CM

## 2018-08-25 DIAGNOSIS — Z96651 Presence of right artificial knee joint: Secondary | ICD-10-CM | POA: Diagnosis present

## 2018-08-25 DIAGNOSIS — Z9989 Dependence on other enabling machines and devices: Secondary | ICD-10-CM

## 2018-08-25 DIAGNOSIS — F411 Generalized anxiety disorder: Secondary | ICD-10-CM

## 2018-08-25 DIAGNOSIS — H53461 Homonymous bilateral field defects, right side: Secondary | ICD-10-CM | POA: Diagnosis present

## 2018-08-25 DIAGNOSIS — E785 Hyperlipidemia, unspecified: Secondary | ICD-10-CM | POA: Diagnosis present

## 2018-08-25 DIAGNOSIS — D3500 Benign neoplasm of unspecified adrenal gland: Secondary | ICD-10-CM | POA: Diagnosis not present

## 2018-08-25 DIAGNOSIS — E669 Obesity, unspecified: Secondary | ICD-10-CM | POA: Diagnosis present

## 2018-08-25 LAB — I-STAT CHEM 8, ED
BUN: 10 mg/dL (ref 8–23)
CREATININE: 0.7 mg/dL (ref 0.44–1.00)
Calcium, Ion: 1.03 mmol/L — ABNORMAL LOW (ref 1.15–1.40)
Chloride: 105 mmol/L (ref 98–111)
Glucose, Bld: 163 mg/dL — ABNORMAL HIGH (ref 70–99)
HEMATOCRIT: 50 % — AB (ref 36.0–46.0)
HEMOGLOBIN: 17 g/dL — AB (ref 12.0–15.0)
Potassium: 3.1 mmol/L — ABNORMAL LOW (ref 3.5–5.1)
Sodium: 141 mmol/L (ref 135–145)
TCO2: 25 mmol/L (ref 22–32)

## 2018-08-25 LAB — RAPID URINE DRUG SCREEN, HOSP PERFORMED
Amphetamines: NOT DETECTED
BARBITURATES: NOT DETECTED
BENZODIAZEPINES: NOT DETECTED
COCAINE: NOT DETECTED
OPIATES: NOT DETECTED
TETRAHYDROCANNABINOL: NOT DETECTED

## 2018-08-25 LAB — URINALYSIS, COMPLETE (UACMP) WITH MICROSCOPIC
BACTERIA UA: NONE SEEN
Bilirubin Urine: NEGATIVE
GLUCOSE, UA: NEGATIVE mg/dL
Ketones, ur: 5 mg/dL — AB
Nitrite: NEGATIVE
Protein, ur: >=300 mg/dL — AB
Specific Gravity, Urine: 1.018 (ref 1.005–1.030)
pH: 6 (ref 5.0–8.0)

## 2018-08-25 LAB — PROTIME-INR
INR: 1.03
Prothrombin Time: 13.5 seconds (ref 11.4–15.2)

## 2018-08-25 LAB — DIFFERENTIAL
Abs Immature Granulocytes: 0.1 10*3/uL (ref 0.0–0.1)
BASOS PCT: 0 %
Basophils Absolute: 0.1 10*3/uL (ref 0.0–0.1)
Eosinophils Absolute: 0 10*3/uL (ref 0.0–0.7)
Eosinophils Relative: 0 %
Immature Granulocytes: 0 %
Lymphocytes Relative: 12 %
Lymphs Abs: 1.8 10*3/uL (ref 0.7–4.0)
MONOS PCT: 7 %
Monocytes Absolute: 1.1 10*3/uL — ABNORMAL HIGH (ref 0.1–1.0)
NEUTROS ABS: 12.1 10*3/uL — AB (ref 1.7–7.7)
NEUTROS PCT: 81 %

## 2018-08-25 LAB — I-STAT VENOUS BLOOD GAS, ED
ACID-BASE EXCESS: 4 mmol/L — AB (ref 0.0–2.0)
BICARBONATE: 27.6 mmol/L (ref 20.0–28.0)
O2 Saturation: 94 %
PO2 VEN: 65 mmHg — AB (ref 32.0–45.0)
TCO2: 29 mmol/L (ref 22–32)
pCO2, Ven: 35.8 mmHg — ABNORMAL LOW (ref 44.0–60.0)
pH, Ven: 7.495 — ABNORMAL HIGH (ref 7.250–7.430)

## 2018-08-25 LAB — COMPREHENSIVE METABOLIC PANEL
ALT: 16 U/L (ref 0–44)
AST: 34 U/L (ref 15–41)
Albumin: 4.5 g/dL (ref 3.5–5.0)
Alkaline Phosphatase: 49 U/L (ref 38–126)
Anion gap: 12 (ref 5–15)
BUN: 9 mg/dL (ref 8–23)
CHLORIDE: 102 mmol/L (ref 98–111)
CO2: 27 mmol/L (ref 22–32)
CREATININE: 0.88 mg/dL (ref 0.44–1.00)
Calcium: 10.4 mg/dL — ABNORMAL HIGH (ref 8.9–10.3)
GFR calc Af Amer: >60 mL/min (ref >60)
GFR calc non Af Amer: 59 mL/min — ABNORMAL LOW (ref >60)
Glucose, Bld: 133 mg/dL — ABNORMAL HIGH (ref 70–99)
Potassium: 3.4 mmol/L — ABNORMAL LOW (ref 3.5–5.1)
Sodium: 141 mmol/L (ref 135–145)
Total Bilirubin: 2.6 mg/dL — ABNORMAL HIGH (ref 0.3–1.2)
Total Protein: 7.3 g/dL (ref 6.5–8.1)

## 2018-08-25 LAB — TSH: TSH: 2.35 u[IU]/mL (ref 0.350–4.500)

## 2018-08-25 LAB — CBC
HCT: 48.8 % — ABNORMAL HIGH (ref 36.0–46.0)
HEMOGLOBIN: 15.8 g/dL — AB (ref 12.0–15.0)
MCH: 30.4 pg (ref 26.0–34.0)
MCHC: 32.4 g/dL (ref 30.0–36.0)
MCV: 93.8 fL (ref 78.0–100.0)
Platelets: 210 10*3/uL (ref 150–400)
RBC: 5.2 MIL/uL — AB (ref 3.87–5.11)
RDW: 12.7 % (ref 11.5–15.5)
WBC: 15.1 10*3/uL — ABNORMAL HIGH (ref 4.0–10.5)

## 2018-08-25 LAB — CBG MONITORING, ED: Glucose-Capillary: 127 mg/dL — ABNORMAL HIGH (ref 70–99)

## 2018-08-25 LAB — I-STAT TROPONIN, ED: Troponin i, poc: 0.03 ng/mL (ref 0.00–0.08)

## 2018-08-25 LAB — APTT: aPTT: 26 seconds (ref 24–36)

## 2018-08-25 LAB — LIPASE, BLOOD: Lipase: 27 U/L (ref 11–51)

## 2018-08-25 LAB — AMMONIA: Ammonia: 22 umol/L (ref 9–35)

## 2018-08-25 LAB — I-STAT CG4 LACTIC ACID, ED: Lactic Acid, Venous: 0.95 mmol/L (ref 0.5–1.9)

## 2018-08-25 MED ORDER — LEVETIRACETAM IN NACL 500 MG/100ML IV SOLN
500.0000 mg | Freq: Two times a day (BID) | INTRAVENOUS | Status: DC
  Administered 2018-08-26: 500 mg via INTRAVENOUS
  Filled 2018-08-25: qty 100

## 2018-08-25 MED ORDER — LORAZEPAM 2 MG/ML IJ SOLN
INTRAMUSCULAR | Status: AC
  Filled 2018-08-25: qty 1

## 2018-08-25 MED ORDER — SODIUM CHLORIDE 0.9 % IV SOLN
1.0000 g | INTRAVENOUS | Status: DC
  Administered 2018-08-26 – 2018-08-28 (×3): 1 g via INTRAVENOUS
  Filled 2018-08-25 (×4): qty 10

## 2018-08-25 MED ORDER — IOPAMIDOL (ISOVUE-370) INJECTION 76%
100.0000 mL | Freq: Once | INTRAVENOUS | Status: AC | PRN
  Administered 2018-08-25: 100 mL via INTRAVENOUS

## 2018-08-25 MED ORDER — ACETAMINOPHEN 650 MG RE SUPP
650.0000 mg | Freq: Four times a day (QID) | RECTAL | Status: DC | PRN
  Administered 2018-08-26: 650 mg via RECTAL
  Filled 2018-08-25 (×2): qty 1

## 2018-08-25 MED ORDER — ONDANSETRON HCL 4 MG/2ML IJ SOLN
4.0000 mg | Freq: Three times a day (TID) | INTRAMUSCULAR | Status: DC | PRN
  Administered 2018-08-26: 4 mg via INTRAVENOUS
  Filled 2018-08-25: qty 2

## 2018-08-25 MED ORDER — POTASSIUM CHLORIDE 10 MEQ/100ML IV SOLN
10.0000 meq | Freq: Once | INTRAVENOUS | Status: AC
  Administered 2018-08-26: 10 meq via INTRAVENOUS
  Filled 2018-08-25 (×2): qty 100

## 2018-08-25 MED ORDER — LEVETIRACETAM IN NACL 1000 MG/100ML IV SOLN
1000.0000 mg | Freq: Once | INTRAVENOUS | Status: AC
  Administered 2018-08-25: 1000 mg via INTRAVENOUS
  Filled 2018-08-25: qty 100

## 2018-08-25 MED ORDER — HYDRALAZINE HCL 20 MG/ML IJ SOLN
5.0000 mg | INTRAMUSCULAR | Status: DC | PRN
  Administered 2018-08-26: 5 mg via INTRAVENOUS
  Filled 2018-08-25 (×2): qty 1

## 2018-08-25 MED ORDER — LORAZEPAM 2 MG/ML IJ SOLN
0.2500 mg | Freq: Two times a day (BID) | INTRAMUSCULAR | Status: DC
  Administered 2018-08-25 – 2018-08-26 (×2): 0.25 mg via INTRAVENOUS
  Filled 2018-08-25 (×2): qty 1

## 2018-08-25 MED ORDER — POTASSIUM CHLORIDE 10 MEQ/100ML IV SOLN
10.0000 meq | INTRAVENOUS | Status: AC
  Administered 2018-08-25 – 2018-08-26 (×2): 10 meq via INTRAVENOUS
  Filled 2018-08-25: qty 100

## 2018-08-25 MED ORDER — SODIUM CHLORIDE 0.9 % IV SOLN
INTRAVENOUS | Status: DC
  Administered 2018-08-25 – 2018-08-26 (×2): via INTRAVENOUS

## 2018-08-25 MED ORDER — ENOXAPARIN SODIUM 40 MG/0.4ML ~~LOC~~ SOLN
40.0000 mg | SUBCUTANEOUS | Status: DC
  Administered 2018-08-26 – 2018-08-28 (×3): 40 mg via SUBCUTANEOUS
  Filled 2018-08-25 (×3): qty 0.4

## 2018-08-25 MED ORDER — STROKE: EARLY STAGES OF RECOVERY BOOK
Freq: Once | Status: AC
  Administered 2018-08-26
  Filled 2018-08-25: qty 1

## 2018-08-25 MED ORDER — PIPERACILLIN-TAZOBACTAM 3.375 G IVPB 30 MIN
3.3750 g | Freq: Once | INTRAVENOUS | Status: AC
  Administered 2018-08-25: 3.375 g via INTRAVENOUS
  Filled 2018-08-25: qty 50

## 2018-08-25 MED ORDER — LORAZEPAM 2 MG/ML IJ SOLN
1.0000 mg | INTRAMUSCULAR | Status: DC | PRN
  Administered 2018-08-26: 1 mg via INTRAVENOUS
  Filled 2018-08-25: qty 1

## 2018-08-25 MED ORDER — LORAZEPAM 2 MG/ML IJ SOLN
1.0000 mg | Freq: Once | INTRAMUSCULAR | Status: AC
  Administered 2018-08-25: 1 mg via INTRAVENOUS

## 2018-08-25 MED ORDER — FAMOTIDINE IN NACL 20-0.9 MG/50ML-% IV SOLN
20.0000 mg | Freq: Two times a day (BID) | INTRAVENOUS | Status: DC
  Administered 2018-08-25 – 2018-08-28 (×6): 20 mg via INTRAVENOUS
  Filled 2018-08-25 (×6): qty 50

## 2018-08-25 NOTE — ED Provider Notes (Signed)
I saw and evaluated the patient, reviewed the resident's note and I agree with the findings and plan. I was present and provided direct supervision for all procedures. Please see associated encounter note. Briefly, the patient is a 84 yo female that was here with altered mental status.  Initial concern for encephalopathy possibly due to occult infection.  However, I was notified that patient became acutely more confused.  She has a leftward gaze.  She has some occasional twitching, though she does also attempt to have purposeful movement.  Concern for possible partial status versus CVA.  She is outside the window of any kind of intervention but I discussed with Dr. Leonel Ramsay, who immediately evaluated the patient.  CT perfusion shows no large deficits.  Patient was given Ativan with improvement.  Will admit to hospitalist.   EKG Interpretation  Date/Time:  Tuesday August 25 2018 13:48:03 EDT Ventricular Rate:  109 PR Interval:  142 QRS Duration: 72 QT Interval:  338 QTC Calculation: 455 R Axis:   55 Text Interpretation:  Sinus tachycardia Left ventricular hypertrophy with repolarization abnormality Cannot rule out Septal infarct , age undetermined Abnormal ECG Since last EKG, rate is increased ST depressions diffusely in inferior and precordial leads, likely rate-related with LVH Confirmed by Duffy Bruce [phone]) on 08/25/2018 3:56:16 PM       CRITICAL CARE Performed by: Evonnie Pat  Total critical care time: 45 minutes  Critical care time was exclusive of separately billable procedures and treating other patients.  Critical care was necessary to treat or prevent imminent or life-threatening deterioration.  Critical care was time spent personally by me on the following activities: development of treatment plan with patient and/or surrogate as well as nursing, discussions with consultants, evaluation of patient's response to treatment, examination of patient, obtaining history from  patient or surrogate, ordering and performing treatments and interventions, ordering and review of laboratory studies, ordering and review of radiographic studies, pulse oximetry and re-evaluation of patient's condition.    Duffy Bruce, MD 08/25/18 2312

## 2018-08-25 NOTE — ED Notes (Signed)
To CT at this time.

## 2018-08-25 NOTE — ED Notes (Signed)
Ateempt to bathroom for murine no success only BM

## 2018-08-25 NOTE — ED Triage Notes (Addendum)
Onset 2 days ago stated by family members patient having altered mental status worsening overtime. Patient has clear speech however unable to answer questions. States "There is nothing wrong with me" Family stated patient was supposed to have a colonoscopy today.

## 2018-08-25 NOTE — Progress Notes (Signed)
Arrived from ED at 2309. Alert and oriented to person, place, and time. Denies any pain. Seizures pad in placed. Family at bedside. Call light within reach.

## 2018-08-25 NOTE — Consult Note (Signed)
Neurology Consultation Reason for Consult: Aphasia Referring Physician: Ellender Hose, C  CC: Aphasia  History is obtained from: Daughter  HPI: [name] is a 84 y.o. female with a history of diabetes, hypertension, hyperlipidemia who presented with confusion that is been ongoing for a couple of days.  Her daughter describes that she just seems confused, but was able to have a conversation, able to take care of her own activities of daily living, even this morning.  She has been undergoing a bowel prep for colonoscopy today and therefore has had significant diarrhea.  Initially, she was stating that there is nothing wrong with her.  Sometime between 530 and 6 PM, she developed sudden change in her status with aphasia and therefore I have been consulted.    LKW: two days ago tpa given?: no, out of window.  Modified Rankin scale: 0  ROS:  Unable to obtain due to altered mental status.   Past Medical History:  Diagnosis Date  . Abdominal pain   . Anemia    NOS  . Anxiety   . Back pain   . Bruises easily   . Chronic diastolic CHF (congestive heart failure) ([place])    a. 06/2013 EF 65-70%.  . Chronic headaches    HISTORY OF   . Depression   . Diabetes mellitus    type II  DIET CONTROLLED  . Diarrhea   . Hepatitis    AGE 30S  . Hyperlipidemia   . Hypertension   . Mild aortic stenosis    a. 06/2013 Echo: EF 65-70%, mod LVH with focal basal hypertrophy, very mild AS.  Marland Kitchen Osteoarthritis   . Oxygen desaturation during sleep    USES 2 LITERS BEDTIME   VIA CPAP 06/2012 [facility]   . Shortness of breath    WITH EXERTION USES 2 L O2 BEDTIME  . Sleep apnea    CPAP WITH O2 2 LITERS 2013 ([facility])  . Wears dentures   . Wears glasses      Family History  Problem Relation Age of Onset  . Cancer Father        prostate cancer  . Heart disease Sister   . Heart disease Brother   . Alcohol abuse Other   . Hypertension Other   . Kidney disease Other   . Mental illness Other   . Emphysema  Brother      Social History:  reports that she has never smoked. She has never used smokeless tobacco. She reports that she does not drink alcohol or use drugs.   Exam: Current vital signs: BP (!) 159/89 (BP Location: Left Arm)   Pulse 88   Temp 98 F (36.7 C)   Resp 15   Ht 5\' 8"  (1.727 m)   Wt 90.7 kg   SpO2 96%   BMI 30.41 kg/m  Vital signs in last 24 hours: Temp:  [98 F (36.7 C)] 98 F (36.7 C) (10/01 1709) Pulse Rate:  [88-104] 88 (10/01 1628) Resp:  [15-16] 15 (10/01 1628) BP: (159-182)/(89-116) 159/89 (10/01 1628) SpO2:  [96 %-98 %] 96 % (10/01 1628) Weight:  [90.7 kg] 90.7 kg (10/01 1346)   Physical Exam  Constitutional: Appears well-developed and well-nourished.  Psych: Affect appropriate to situation Eyes: No scleral injection HENT: No OP obstrucion Head: Normocephalic.  Cardiovascular: Normal rate and regular rhythm.  Respiratory: Effort normal, non-labored breathing GI: Soft.  No distension. There is no tenderness.  Skin: WDI  Neuro: Mental Status: Patient is awake, alert,  she is globally aphasic Cranial Nerves: II: Right hemianopia.  Pupils are equal, round, and reactive to light.   III,IV, VI: She has a left gaze preference, does not cross midline to the right V: Facial sensation is symmetric to temperature VII: Facial movement with mildly decreased nasolabial fold on the right VIII: hearing is intact to voice X: Uvula elevates symmetrically XI: Shoulder shrug is symmetric. XII: tongue is midline without atrophy or fasciculations.  Motor: She does not cooperate with formal testing, however she does give good effort bilaterally, without clear asymmetry. Sensory: She responds to noxious stimulation in all 4 extremities Cerebellar: She does not perform  I have reviewed labs in epic and the results pertinent to this consultation are: CMP-unremarkable  I have reviewed the images obtained:  CT head-unremarkable  Impression: 84 year old female  with 2 days of mild confusion who had an abrupt onset of left gaze preference, right hemianopia, right facial droop and global aphasia.  With the confusion for 2 days, I do not think she is an IV TPA candidate, but given the abrupt recent onset of LVO symptoms I do think that evaluation for possible intervention is appropriate and therefore she is being taken for acute CT perfusion.  Recommendations: 1) CT perfusion/Angio 2) further recs following CTA/P    Roland Rack, MD [facility] [phone]  If 7pm- 7am, please page neurology on call as listed in [place].

## 2018-08-25 NOTE — Telephone Encounter (Signed)
Copied from [place] [id number]. Topic: Inquiry >> Aug 25, 2018  8:27 AM Oliver Pila B wrote: Reason for CRM: pt's daughter called b/c she is concerned about the pt, but she is asking for help about a UTI; she is asking for a lab test for a urinalysis; Daughter states the pt is in a state of confusion and is concerned; Daughter is in [place] at the moment and in meetings if possible send a text message that will notify her to contact someone; contact to advise

## 2018-08-25 NOTE — H&P (Signed)
History and Physical    [name] QJJ:[id number] DOB: May 25, 1933 DOA: 08/25/2018  Referring MD/NP/PA:   PCP: [name], MD   Patient coming from:  The patient is coming from home.  At baseline, pt is independent for most of ADL.   Chief Complaint: AMS  HPI: [name] is a 84 y.o. female with medical history significant of hypertension, hyperlipidemia, diet-controlled diabetes, dCHF, GERD, anxiety, hepatitis, aortic stenosis, OSA on CPAP, obesity, dementia, remote seizure not on anti-seizure medications, who presents with altered mental status.  Per her daughter, patient has been confused in the past 2 days.  Normally patient is alert, oriented x3. She knows her own name, but not orientated to place and time now.  She moves all extremities normally.  No facial droop, vision change or hearing loss. She was noted to have mild body twitching activities. Late afternoon today, pt could not speak out. Per her daughter, patient does not seem to have chest pain, shortness of breath, nausea, vomiting or abdominal pain. She has been undergoing a bowel prep for colonoscopy today and therefore has had significant diarrhea. Due to altered mental status, colonoscopy procedure was canceled.  Not sure if patient has any symptoms of UTI.   ED Course: pt was found to have WBC 15.1, lactic acid is 0.95, negative troponin, negative UDS, ammonia level 22, urinalysis (trace amount of leukocyte, no bacteria, hazy appearance, WBC 21-50), potassium 3.4, creatinine normal, temperature normal, elevated blood pressure two 4/109, initially tachycardia, heart rate 80s now, oxygen saturation 94% on room air. CTA of head and neck and CTA of brain perfusion has not LVO or acute intracranial abnormalities.  CT head is negative for acute intracranial abnormalities.  CT of abdomen/pelvis is negative for acute intra-abdominal/pelvis issue.  Patient is placed on telemetry bed for observation.  Neurology, Dr. Leonel Ramsay was  consulted. Per nurse, patient did not pass swallowing screen testing in the ED.  Review of Systems: Could not be reviewed accurately due to altered mental status.  Allergy:  Allergies  Allergen Reactions  . Amlodipine Other (See Comments)    Reaction:  Dizziness   . Enalapril Cough  . Losartan Potassium Other (See Comments)    Reaction:  GI pain   . Morphine And Related Itching and Nausea And Vomiting  . Metformin Diarrhea    Past Medical History:  Diagnosis Date  . Abdominal pain   . Anemia    NOS  . Anxiety   . Back pain   . Bruises easily   . Chronic diastolic CHF (congestive heart failure) ([place])    a. 06/2013 EF 65-70%.  . Chronic headaches    HISTORY OF   . Depression   . Diabetes mellitus    type II  DIET CONTROLLED  . Diarrhea   . Hepatitis    AGE 30S  . Hyperlipidemia   . Hypertension   . Mild aortic stenosis    a. 06/2013 Echo: EF 65-70%, mod LVH with focal basal hypertrophy, very mild AS.  Marland Kitchen Osteoarthritis   . Oxygen desaturation during sleep    USES 2 LITERS BEDTIME   VIA CPAP 06/2012 [facility]   . Shortness of breath    WITH EXERTION USES 2 L O2 BEDTIME  . Sleep apnea    CPAP WITH O2 2 LITERS 2013 ([facility])  . Wears dentures   . Wears glasses     Past Surgical History:  Procedure Laterality Date  . ABDOMINAL HYSTERECTOMY    .  APPENDECTOMY    . CHOLECYSTECTOMY    . JOINT REPLACEMENT     LEFT KNEE   . KNEE ARTHROPLASTY Right 03/22/2015   Procedure: COMPUTER ASSISTED TOTAL KNEE ARTHROPLASTY;  Surgeon: Marybelle Killings, MD;  Location: [facility];  Service: Orthopedics;  Laterality: Right;  . KNEE ARTHROSCOPY     RIGHT  . LEFT AND RIGHT HEART CATHETERIZATION WITH CORONARY ANGIOGRAM N/A 10/28/2013   Procedure: LEFT AND RIGHT HEART CATHETERIZATION WITH CORONARY ANGIOGRAM;  Surgeon: Blane Ohara, MD;  Location: [facility] C[facility] LAB;  Service: Cardiovascular;  Laterality: N/A;  . LEFT HEART CATHETERIZATION WITH CORONARY ANGIOGRAM N/A 11/14/2014   Procedure: LEFT HEART  CATHETERIZATION WITH CORONARY ANGIOGRAM;  Surgeon: Josue Hector, MD;  Location: [facility] CATH LAB;  Service: Cardiovascular;  Laterality: N/A;  . REVISION TOTAL KNEE ARTHROPLASTY  2011  . ROTATOR CUFF REPAIR  2010  . SHOULDER ARTHROSCOPY  09/17/2012   Procedure: ARTHROSCOPY SHOULDER;  Surgeon: Sharmon Revere, MD;  Location: [facility];  Service: Orthopedics;  Laterality: Left;  . TONSILLECTOMY    . TONSILLECTOMY      Social History:  reports that she has never smoked. She has never used smokeless tobacco. She reports that she does not drink alcohol or use drugs.  Family History:  Family History  Problem Relation Age of Onset  . Cancer Father        prostate cancer  . Heart disease Sister   . Heart disease Brother   . Alcohol abuse Other   . Hypertension Other   . Kidney disease Other   . Mental illness Other   . Emphysema Brother      Prior to Admission medications   Medication Sig Start Date End Date Taking? Authorizing Provider  carvedilol (COREG) 6.25 MG tablet TAKE 1 TABLET BY MOUTH TWICE A DAY WITH MEALS 07/13/18  Yes [name], MD  chlorthalidone (HYGROTON) 25 MG tablet TAKE 1 TABLET BY MOUTH ONCE DAILY 05/29/18  Yes [name], MD  clonazePAM (KLONOPIN) 1 MG tablet TAKE 1 TABLET BY MOUTH TWICE A DAY IF NEEDED FOR ANXIETY 04/24/18  Yes [name], MD  gabapentin (NEURONTIN) 100 MG capsule Take 1 capsule (100 mg total) by mouth 3 (three) times daily. 07/09/18  Yes [name], MD  MYRBETRIQ 25 MG TB24 tablet TAKE 1 TABLET BY MOUTH ONCE DAILY 05/05/18  Yes [name], MD  omeprazole (PRILOSEC) 40 MG capsule Take 1 capsule (40 mg total) by mouth daily. 02/16/18  Yes [name], MD  oxyCODONE-acetaminophen (PERCOCET) 10-325 MG tablet Take 1 tablet by mouth every 8 (eight) hours as needed for pain. 07/31/18  Yes [name], MD  promethazine (PHENERGAN) 12.5 MG tablet Take 1 tablet (12.5 mg total) by mouth every 6 (six) hours as needed for nausea or vomiting.  07/06/18  Yes [name], MD  traZODone (DESYREL) 100 MG tablet TAKE 2 TABLETS BY MOUTH AT BEDTIME 08/08/18  Yes [name], MD  calcium carbonate (OS-CAL) 600 MG TABS tablet Take 600 mg by mouth 2 (two) times daily with a meal.    [provider]  Cholecalciferol 2000 units TABS Take 2 tablets (4,000 Units total) by mouth daily. 03/31/18   [name], MD  conjugated estrogens (PREMARIN) vaginal cream Place 1 Applicatorful vaginally daily. Patient not taking: Reported on 08/25/2018 05/15/18   Marrian Salvage, FNP  hydrALAZINE (APRESOLINE) 50 MG tablet Take 1 tablet (50 mg total) 3 (three) times daily  by mouth. Patient not taking: Reported on 08/25/2018 10/06/17   [name], MD  potassium chloride (K-DUR) 10 MEQ tablet Take 1 tablet (10 mEq total) by mouth 3 (three) times daily. Patient not taking: Reported on 08/25/2018 12/18/17   [name], MD    Physical Exam: Vitals:   08/25/18 2212 08/25/18 2215 08/25/18 2309 08/26/18 0109  BP: (!) 169/97 132/81 (!) 165/91 (!) 185/107  Pulse: 88 94 87   Resp: 16 19 19 20   Temp:   98.8 F (37.1 C) 99.7 F (37.6 C)  TempSrc:   Oral Oral  SpO2: 98% 97% 97% 97%  Weight:      Height:       General: Not in acute distress HEENT:       Eyes: PERRL, EOMI, no scleral icterus.       ENT: No discharge from the ears and nose, no pharynx injection, no tonsillar enlargement.        Neck: No JVD, no bruit, no mass felt. Heme: No neck lymph node enlargement. Cardiac: S1/S2, RRR, No murmurs, No gallops or rubs. Respiratory: No rales, wheezing, rhonchi or rubs. GI: Soft, nondistended, nontender, No organomegaly, BS present. GU: No hematuria Ext: No pitting leg edema bilaterally. 2+DP/PT pulse bilaterally. Musculoskeletal: No joint deformities, No joint redness or warmth, no limitation of ROM in spin. Skin: No rashes.  Neuro: confused, knows her own name, but not orientated to place and time now. cranial nerves II-XII grossly  intact, moves all extremities normally.  Psych: Patient is not psychotic, no suicidal or hemocidal ideation.  Labs on Admission: I have personally reviewed following labs and imaging studies  CBC: Recent Labs  Lab 08/25/18 1453 08/25/18 1555  WBC  --  15.1*  NEUTROABS  --  12.1*  HGB 17.0* 15.8*  HCT 50.0* 48.8*  MCV  --  93.8  PLT  --  188   Basic Metabolic Panel: Recent Labs  Lab 08/25/18 1453 08/25/18 1555  NA 141 141  K 3.1* 3.4*  CL 105 102  CO2  --  27  GLUCOSE 163* 133*  BUN 10 9  CREATININE 0.70 0.88  CALCIUM  --  10.4*   GFR: Estimated Creatinine Clearance: 57 mL/min (by C-G formula based on SCr of 0.88 mg/dL). Liver Function Tests: Recent Labs  Lab 08/25/18 1555  AST 34  ALT 16  ALKPHOS 49  BILITOT 2.6*  PROT 7.3  ALBUMIN 4.5   Recent Labs  Lab 08/25/18 1803  LIPASE 27   Recent Labs  Lab 08/25/18 1555  AMMONIA 22   Coagulation Profile: Recent Labs  Lab 08/25/18 1555  INR 1.03   Cardiac Enzymes: No results for input(s): CKTOTAL, CKMB, CKMBINDEX, TROPONINI in the last 168 hours. BNP (last 3 results) No results for input(s): PROBNP in the last 8760 hours. HbA1C: No results for input(s): HGBA1C in the last 72 hours. CBG: Recent Labs  Lab 08/25/18 1745  GLUCAP 127*   Lipid Profile: Recent Labs    08/26/18 0116  CHOL 194  HDL 69  LDLCALC 106*  TRIG 95  CHOLHDL 2.8   Thyroid Function Tests: Recent Labs    08/25/18 1803  TSH 2.350   Anemia Panel: No results for input(s): VITAMINB12, FOLATE, FERRITIN, TIBC, IRON, RETICCTPCT in the last 72 hours. Urine analysis:    Component Value Date/Time   COLORURINE AMBER (A) 08/25/2018 1835   APPEARANCEUR HAZY (A) 08/25/2018 1835   LABSPEC 1.018 08/25/2018 1835   PHURINE 6.0 08/25/2018 1835  GLUCOSEU NEGATIVE 08/25/2018 [place] 03/30/2018 1358   HGBUR MODERATE (A) 08/25/2018 [place] 08/25/2018 1835   BILIRUBINUR neg 05/15/2018 1145    KETONESUR 5 (A) 08/25/2018 1835   PROTEINUR >=300 (A) 08/25/2018 1835   UROBILINOGEN 0.2 05/15/2018 1145   UROBILINOGEN 0.2 03/30/2018 1358   NITRITE NEGATIVE 08/25/2018 1835   LEUKOCYTESUR TRACE (A) 08/25/2018 1835   Sepsis Labs: @LABRCNTIP (procalcitonin:4,lacticidven:4) )No results found for this or any previous visit (from the past 240 hour(s)).   Radiological Exams on Admission: Ct Angio Head W Or Wo Contrast  Result Date: 08/25/2018 CLINICAL DATA:  Initial evaluation for acute altered mental status. EXAM: CT ANGIOGRAPHY HEAD AND NECK CT PERFUSION BRAIN TECHNIQUE: Multidetector CT imaging of the head and neck was performed using the standard protocol during bolus administration of intravenous contrast. Multiplanar CT image reconstructions and MIPs were obtained to evaluate the vascular anatomy. Carotid stenosis measurements (when applicable) are obtained utilizing NASCET criteria, using the distal internal carotid diameter as the denominator. Multiphase CT imaging of the brain was performed following IV bolus contrast injection. Subsequent parametric perfusion maps were calculated using RAPID software. CONTRAST:  111mL ISOVUE-370 IOPAMIDOL (ISOVUE-370) INJECTION 76% COMPARISON:  Prior CT from earlier the same day. FINDINGS: CT HEAD FINDINGS Brain: Atrophy with chronic microvascular ischemic disease again noted, stable. Small remote right thalamic and right cerebellar infarcts noted. No evidence for acute or interval intracranial hemorrhage. No acute large vessel territory infarct. No mass lesion, midline shift or mass effect. No hydrocephalus. No extra-axial fluid collection. Vascular: No hyperdense vessel. Scattered vascular calcifications noted within the carotid siphons. Skull: Scalp soft tissues and calvarium within normal limits. Sinuses/Orbits: Right maxillary sinus retention cyst. Acute on chronic right sphenoid sinusitis. Mastoid air cells and middle ear cavities are clear. Globes and  orbital soft tissues within normal limits. Other: None. CTA NECK FINDINGS Aortic arch: Visualized aortic arch of normal caliber with normal branch pattern. Incidental note made of a bovine arch with common origin of the left common and brachiocephalic arteries. Mild atherosclerotic change about the arch and origin of the great vessels without hemodynamically significant stenosis. Visualized subclavian arteries widely patent. Right carotid system: Right common carotid artery patent from its origin to the bifurcation without stenosis. A centric calcified plaque about the proximal right ICA with associated stenosis of up to 70% by NASCET criteria. Right ICA tortuous but otherwise patent to the skull base without stenosis, dissection, or occlusion Left carotid system: Left common carotid artery tortuous proximally but widely patent to the bifurcation. Scattered calcified plaque about the proximal left ICA with associated stenosis of up to 75-80% by NASCET criteria. Left ICA tortuous but otherwise patent to the skull base. Both internal carotid arteries medialized into the retropharyngeal space. Vertebral arteries: Both of the vertebral arteries arise from the subclavian arteries. Left vertebral artery dominant. Mild plaque at the origin of the vertebral arteries bilaterally without significant stenosis. Vertebral arteries widely patent within the neck without stenosis, dissection, or occlusion. Skeleton: No acute osseous abnormality. No discrete osseous lesions. Prominent osteoarthritic changes about the C1-2 articulation. Advanced right-sided facet arthrosis at C2-3. Other neck: Thyroid diffuse lead enlarged and heterogeneous in appearance, likely related to goiter. Soft tissues of the neck demonstrate no acute finding. Upper chest: Visualized upper chest and mediastinum within normal limits. Small focus of tree-in-bud nodular opacity at the posterior left upper lobe (series 11, image 159), consistent with mild small  airways disease. Review of the MIP images confirms  the above findings CTA HEAD FINDINGS Anterior circulation: Petrous segments widely patent bilaterally. Calcified plaque within the cavernous/supraclinoid ICAs with resultant mild to moderate multifocal narrowing, right slightly worse than left. ICA termini widely patent. A1 segments patent bilaterally. Normal anterior communicating artery. Anterior cerebral arteries patent to their distal aspects without stenosis. No M1 stenosis or occlusion. No proximal M2 occlusion. Distal MCA branches well perfused and symmetric. Posterior circulation: Focal non stenotic plaque noted within the dominant left V4 segment. Left vertebral artery otherwise widely patent to the vertebrobasilar junction. Right vertebral artery diminutive but widely patent as well. Posterior inferior cerebral arteries patent bilaterally. Basilar tortuous but patent to its distal aspect without high-grade stenosis. Superior cerebral arteries patent bilaterally. Right PCA supplied via the basilar. Fetal type origin of the left PCA. Single short-segment moderate left P2 stenosis noted. PCAs otherwise widely patent to their distal aspects. Venous sinuses: Not well assessed due to arterial timing of the contrast bolus, but grossly patent. Anatomic variants: Fetal type left PCA.  No intracranial aneurysm. Delayed phase: Not performed. Review of the MIP images confirms the above findings CT Brain Perfusion Findings: CBF (<30%) Volume: 60mL Perfusion (Tmax>6.0s) volume: 57mL Mismatch Volume: 26mL Infarction Location:Negative. IMPRESSION: 1. Negative CTA with no large vessel occlusion identified. 2. Negative CT perfusion with no evidence for core infarct or perfusion abnormality. 3. Atheromatous stenoses about the carotid bifurcations bilaterally, measuring up to 70% on the right and 75-80% on the left. 4. Additional mild scattered atherosclerotic change elsewhere within the major arterial vasculature of the head  and neck as above. No other high-grade or correctable stenosis. 5. Thyroid goiter. Electronically Signed   By: Jeannine Boga M.D.   On: 08/25/2018 20:03   Ct Head Wo Contrast  Result Date: 08/25/2018 CLINICAL DATA:  Altered mental status 2 days. EXAM: CT HEAD WITHOUT CONTRAST TECHNIQUE: Contiguous axial images were obtained from the base of the skull through the vertex without intravenous contrast. COMPARISON:  08/05/2017 and 10/26/2013 FINDINGS: Brain: Ventricles, cisterns and other CSF spaces are within normal. There is mild chronic ischemic microvascular disease. Tiny old right thalamic lacunar infarct. No mass, mass effect, shift of midline structures or acute hemorrhage. No evidence of acute infarction. Vascular: No hyperdense vessel or unexpected calcification. Skull: Well-defined sclerotic focus over the left frontal skull unchanged. Sinuses/Orbits: No acute finding. Other: None. IMPRESSION: No acute findings. Mild chronic ischemic microvascular disease. Small old thalamic lacunar infarct. Electronically Signed   By: Marin Olp M.D.   On: 08/25/2018 16:09   Ct Angio Neck W Or Wo Contrast  Result Date: 08/25/2018 CLINICAL DATA:  Initial evaluation for acute altered mental status. EXAM: CT ANGIOGRAPHY HEAD AND NECK CT PERFUSION BRAIN TECHNIQUE: Multidetector CT imaging of the head and neck was performed using the standard protocol during bolus administration of intravenous contrast. Multiplanar CT image reconstructions and MIPs were obtained to evaluate the vascular anatomy. Carotid stenosis measurements (when applicable) are obtained utilizing NASCET criteria, using the distal internal carotid diameter as the denominator. Multiphase CT imaging of the brain was performed following IV bolus contrast injection. Subsequent parametric perfusion maps were calculated using RAPID software. CONTRAST:  123mL ISOVUE-370 IOPAMIDOL (ISOVUE-370) INJECTION 76% COMPARISON:  Prior CT from earlier the same  day. FINDINGS: CT HEAD FINDINGS Brain: Atrophy with chronic microvascular ischemic disease again noted, stable. Small remote right thalamic and right cerebellar infarcts noted. No evidence for acute or interval intracranial hemorrhage. No acute large vessel territory infarct. No mass lesion, midline shift or mass effect.  No hydrocephalus. No extra-axial fluid collection. Vascular: No hyperdense vessel. Scattered vascular calcifications noted within the carotid siphons. Skull: Scalp soft tissues and calvarium within normal limits. Sinuses/Orbits: Right maxillary sinus retention cyst. Acute on chronic right sphenoid sinusitis. Mastoid air cells and middle ear cavities are clear. Globes and orbital soft tissues within normal limits. Other: None. CTA NECK FINDINGS Aortic arch: Visualized aortic arch of normal caliber with normal branch pattern. Incidental note made of a bovine arch with common origin of the left common and brachiocephalic arteries. Mild atherosclerotic change about the arch and origin of the great vessels without hemodynamically significant stenosis. Visualized subclavian arteries widely patent. Right carotid system: Right common carotid artery patent from its origin to the bifurcation without stenosis. A centric calcified plaque about the proximal right ICA with associated stenosis of up to 70% by NASCET criteria. Right ICA tortuous but otherwise patent to the skull base without stenosis, dissection, or occlusion Left carotid system: Left common carotid artery tortuous proximally but widely patent to the bifurcation. Scattered calcified plaque about the proximal left ICA with associated stenosis of up to 75-80% by NASCET criteria. Left ICA tortuous but otherwise patent to the skull base. Both internal carotid arteries medialized into the retropharyngeal space. Vertebral arteries: Both of the vertebral arteries arise from the subclavian arteries. Left vertebral artery dominant. Mild plaque at the origin  of the vertebral arteries bilaterally without significant stenosis. Vertebral arteries widely patent within the neck without stenosis, dissection, or occlusion. Skeleton: No acute osseous abnormality. No discrete osseous lesions. Prominent osteoarthritic changes about the C1-2 articulation. Advanced right-sided facet arthrosis at C2-3. Other neck: Thyroid diffuse lead enlarged and heterogeneous in appearance, likely related to goiter. Soft tissues of the neck demonstrate no acute finding. Upper chest: Visualized upper chest and mediastinum within normal limits. Small focus of tree-in-bud nodular opacity at the posterior left upper lobe (series 11, image 159), consistent with mild small airways disease. Review of the MIP images confirms the above findings CTA HEAD FINDINGS Anterior circulation: Petrous segments widely patent bilaterally. Calcified plaque within the cavernous/supraclinoid ICAs with resultant mild to moderate multifocal narrowing, right slightly worse than left. ICA termini widely patent. A1 segments patent bilaterally. Normal anterior communicating artery. Anterior cerebral arteries patent to their distal aspects without stenosis. No M1 stenosis or occlusion. No proximal M2 occlusion. Distal MCA branches well perfused and symmetric. Posterior circulation: Focal non stenotic plaque noted within the dominant left V4 segment. Left vertebral artery otherwise widely patent to the vertebrobasilar junction. Right vertebral artery diminutive but widely patent as well. Posterior inferior cerebral arteries patent bilaterally. Basilar tortuous but patent to its distal aspect without high-grade stenosis. Superior cerebral arteries patent bilaterally. Right PCA supplied via the basilar. Fetal type origin of the left PCA. Single short-segment moderate left P2 stenosis noted. PCAs otherwise widely patent to their distal aspects. Venous sinuses: Not well assessed due to arterial timing of the contrast bolus, but  grossly patent. Anatomic variants: Fetal type left PCA.  No intracranial aneurysm. Delayed phase: Not performed. Review of the MIP images confirms the above findings CT Brain Perfusion Findings: CBF (<30%) Volume: 48mL Perfusion (Tmax>6.0s) volume: 3mL Mismatch Volume: 11mL Infarction Location:Negative. IMPRESSION: 1. Negative CTA with no large vessel occlusion identified. 2. Negative CT perfusion with no evidence for core infarct or perfusion abnormality. 3. Atheromatous stenoses about the carotid bifurcations bilaterally, measuring up to 70% on the right and 75-80% on the left. 4. Additional mild scattered atherosclerotic change elsewhere within the major arterial  vasculature of the head and neck as above. No other high-grade or correctable stenosis. 5. Thyroid goiter. Electronically Signed   By: Jeannine Boga M.D.   On: 08/25/2018 20:03   Ct Abdomen Pelvis W Contrast  Result Date: 08/25/2018 CLINICAL DATA:  Acute abdominal pain EXAM: CT ABDOMEN AND PELVIS WITH CONTRAST TECHNIQUE: Multidetector CT imaging of the abdomen and pelvis was performed using the standard protocol following bolus administration of intravenous contrast. CONTRAST:  116mL ISOVUE-370 IOPAMIDOL (ISOVUE-370) INJECTION 76% COMPARISON:  CT 07/07/2018, 05/20/2014 FINDINGS: Lower chest: Lung bases demonstrate no acute consolidation or effusion. Heart size upper limits of normal Hepatobiliary: Status post cholecystectomy. No focal hepatic abnormality. Stable slight enlargement of the extrahepatic bile duct. Pancreas: Unremarkable. No pancreatic ductal dilatation or surrounding inflammatory changes. Spleen: Normal in size without focal abnormality. Adrenals/Urinary Tract: Adrenal glands are stable. 2.7 cm right adrenal mass. Left adrenal gland is normal. No hydronephrosis. Subcentimeter hypodense renal lesions too small to further characterize. Bladder normal Stomach/Bowel: Stomach is nonenlarged. No dilated small bowel. No colon wall  thickening. Sigmoid colon diverticula without acute inflammatory process. Vascular/Lymphatic: Nonaneurysmal aorta. Extensive aortic atherosclerosis. No significantly enlarged lymph nodes. Reproductive: Status post hysterectomy. No adnexal masses. Other: Negative for free air or free fluid. Fat within the bilateral inguinal regions. Musculoskeletal: Degenerative changes of the spine. No acute or suspicious abnormality. IMPRESSION: 1. No CT evidence for acute intra-abdominal or pelvic abnormality. 2. Status post cholecystectomy. Slight prominence of the extrahepatic bile duct but without interval increase in dilatation. No intra hepatic biliary dilatation is visualized. 3. Stable 2.7 cm right adrenal gland adenoma 4. Sigmoid colon diverticular disease without acute inflammatory process. Electronically Signed   By: Donavan Foil M.D.   On: 08/25/2018 19:18   Ct Cerebral Perfusion W Contrast  Result Date: 08/25/2018 CLINICAL DATA:  Initial evaluation for acute altered mental status. EXAM: CT ANGIOGRAPHY HEAD AND NECK CT PERFUSION BRAIN TECHNIQUE: Multidetector CT imaging of the head and neck was performed using the standard protocol during bolus administration of intravenous contrast. Multiplanar CT image reconstructions and MIPs were obtained to evaluate the vascular anatomy. Carotid stenosis measurements (when applicable) are obtained utilizing NASCET criteria, using the distal internal carotid diameter as the denominator. Multiphase CT imaging of the brain was performed following IV bolus contrast injection. Subsequent parametric perfusion maps were calculated using RAPID software. CONTRAST:  181mL ISOVUE-370 IOPAMIDOL (ISOVUE-370) INJECTION 76% COMPARISON:  Prior CT from earlier the same day. FINDINGS: CT HEAD FINDINGS Brain: Atrophy with chronic microvascular ischemic disease again noted, stable. Small remote right thalamic and right cerebellar infarcts noted. No evidence for acute or interval intracranial  hemorrhage. No acute large vessel territory infarct. No mass lesion, midline shift or mass effect. No hydrocephalus. No extra-axial fluid collection. Vascular: No hyperdense vessel. Scattered vascular calcifications noted within the carotid siphons. Skull: Scalp soft tissues and calvarium within normal limits. Sinuses/Orbits: Right maxillary sinus retention cyst. Acute on chronic right sphenoid sinusitis. Mastoid air cells and middle ear cavities are clear. Globes and orbital soft tissues within normal limits. Other: None. CTA NECK FINDINGS Aortic arch: Visualized aortic arch of normal caliber with normal branch pattern. Incidental note made of a bovine arch with common origin of the left common and brachiocephalic arteries. Mild atherosclerotic change about the arch and origin of the great vessels without hemodynamically significant stenosis. Visualized subclavian arteries widely patent. Right carotid system: Right common carotid artery patent from its origin to the bifurcation without stenosis. A centric calcified plaque about the proximal right  ICA with associated stenosis of up to 70% by NASCET criteria. Right ICA tortuous but otherwise patent to the skull base without stenosis, dissection, or occlusion Left carotid system: Left common carotid artery tortuous proximally but widely patent to the bifurcation. Scattered calcified plaque about the proximal left ICA with associated stenosis of up to 75-80% by NASCET criteria. Left ICA tortuous but otherwise patent to the skull base. Both internal carotid arteries medialized into the retropharyngeal space. Vertebral arteries: Both of the vertebral arteries arise from the subclavian arteries. Left vertebral artery dominant. Mild plaque at the origin of the vertebral arteries bilaterally without significant stenosis. Vertebral arteries widely patent within the neck without stenosis, dissection, or occlusion. Skeleton: No acute osseous abnormality. No discrete osseous  lesions. Prominent osteoarthritic changes about the C1-2 articulation. Advanced right-sided facet arthrosis at C2-3. Other neck: Thyroid diffuse lead enlarged and heterogeneous in appearance, likely related to goiter. Soft tissues of the neck demonstrate no acute finding. Upper chest: Visualized upper chest and mediastinum within normal limits. Small focus of tree-in-bud nodular opacity at the posterior left upper lobe (series 11, image 159), consistent with mild small airways disease. Review of the MIP images confirms the above findings CTA HEAD FINDINGS Anterior circulation: Petrous segments widely patent bilaterally. Calcified plaque within the cavernous/supraclinoid ICAs with resultant mild to moderate multifocal narrowing, right slightly worse than left. ICA termini widely patent. A1 segments patent bilaterally. Normal anterior communicating artery. Anterior cerebral arteries patent to their distal aspects without stenosis. No M1 stenosis or occlusion. No proximal M2 occlusion. Distal MCA branches well perfused and symmetric. Posterior circulation: Focal non stenotic plaque noted within the dominant left V4 segment. Left vertebral artery otherwise widely patent to the vertebrobasilar junction. Right vertebral artery diminutive but widely patent as well. Posterior inferior cerebral arteries patent bilaterally. Basilar tortuous but patent to its distal aspect without high-grade stenosis. Superior cerebral arteries patent bilaterally. Right PCA supplied via the basilar. Fetal type origin of the left PCA. Single short-segment moderate left P2 stenosis noted. PCAs otherwise widely patent to their distal aspects. Venous sinuses: Not well assessed due to arterial timing of the contrast bolus, but grossly patent. Anatomic variants: Fetal type left PCA.  No intracranial aneurysm. Delayed phase: Not performed. Review of the MIP images confirms the above findings CT Brain Perfusion Findings: CBF (<30%) Volume: 30mL  Perfusion (Tmax>6.0s) volume: 86mL Mismatch Volume: 47mL Infarction Location:Negative. IMPRESSION: 1. Negative CTA with no large vessel occlusion identified. 2. Negative CT perfusion with no evidence for core infarct or perfusion abnormality. 3. Atheromatous stenoses about the carotid bifurcations bilaterally, measuring up to 70% on the right and 75-80% on the left. 4. Additional mild scattered atherosclerotic change elsewhere within the major arterial vasculature of the head and neck as above. No other high-grade or correctable stenosis. 5. Thyroid goiter. Electronically Signed   By: Jeannine Boga M.D.   On: 08/25/2018 20:03   Dg Chest Port 1 View  Result Date: 08/25/2018 CLINICAL DATA:  History of CHF.  Altered mental status. EXAM: PORTABLE CHEST 1 VIEW COMPARISON:  11/02/2017.  03/10/2015.  CT 05/19/2014. FINDINGS: Stable prominence of the superior mediastinum consistent with substernal goiter as previously noted on CT of 05/19/2014. Heart size stable. Low lung volumes mild basilar atelectasis. No focal alveolar infiltrate. No pleural effusion or pneumothorax. IMPRESSION: 1.  Substernal goiter. 2. Low lung volumes with mild basilar atelectasis. Exam otherwise unremarkable. Electronically Signed   By: Marcello Moores  Register   On: 08/25/2018 15:04     EKG:  Independently reviewed.  Sinus rhythm, QTC 455, LAE, anteroseptal infarction pattern, mild ST depression in inferior leads and V3-V6   Assessment/Plan Principal Problem:   Acute metabolic encephalopathy Active Problems:   Essential hypertension   OSA (obstructive sleep apnea)   Sleep-related hypoventilation   Hypokalemia   Seizure (HCC)   UTI (urinary tract infection)   GERD (gastroesophageal reflux disease)   Chronic diastolic CHF (congestive heart failure) (HCC)   Hypercalcemia   Anxiety   Acute metabolic encephalopathy: Etiology is not clear.  CT head is negative for acute intracranial abnormalities.  CT A/P negative for LVO.   Neurology, Dr. Leonel Ramsay was consulted-->suspecting seizure.  Another differential diagnosis of stroke.  Ammonium level normal 22.  -Placed on telemetry bed for observation - Keppra 1 g was loaded, will continue 500 mg twice daily -Frequent neuro check -EEG -Seizure precaution -When necessary Ativan for seizure -Patient may need MRI of the brain to rule out stroke--> will defer to neuologist recommendation -hold oral meds until mental status improves  -F/U SLP  HTN:  -Hold Coreg, chlorthalidone, hydralazine due to altered mental status -IV hydralazine prn  OSA (obstructive sleep apnea) and Sleep-related hypoventilation: -hold CPAP until mental status improves  Hypokalemia: K=3.4  on admission. - Repleted  Possible UTI (urinary tract infection):  Urinalysis showed trace amount of leukocyte, no bacteria, hazy appearance, WBC 21-50. This not very typical for UTI, but given AMS, will start ABx. -Rocephin (patient received 1 dose of Zosyn in ED), -Follow-up urine culture and blood culture  GERD: -Pepcid IV  Chronic diastolic CHF (congestive heart failure) ([place]): 2D echo on 10/28/2013 showed EF of 60 to 65% with grade 1 diastolic dysfunction.  Patient does not have leg edema or JVD.  CHF is compensated. -Check BNP  Hypercalcemia: Mild, calcium 10.4, possibly due to dehydration. -IV fluid: 100 cc/h of normal saline bolus. -Hold calcium carbonate  Anxiety: -Hold Klonopin -Ativan 0.25 mg twice daily    DVT ppx: SQ Lovenox Code Status: Full code Family Communication: Yes, patient's daughter and granddaughter at bed side Disposition Plan:  Anticipate discharge back to previous home environment Consults called: Dr. Leonel Ramsay of neurology Admission status: Obs / tele    Date of Service 08/26/2018    Ivor Costa [facility] Pager [phone]  If 7PM-7AM, please contact night-coverage [email or web address] Password [email or web address] 08/26/2018, 3:00 AM

## 2018-08-25 NOTE — Progress Notes (Signed)
CT A/P is negative.  On further history, there was an event with some twitching at the onset of the event. This does raise the possible etiology of seizure.  She also may have a history of seizures.  Given this, she has been given a milligram of IV Ativan and going to be loaded with 1 g of IV Keppra.   Roland Rack, MD [facility] [phone]  If 7pm- 7am, please page neurology on call as listed in [place].

## 2018-08-25 NOTE — ED Notes (Signed)
Return from CT

## 2018-08-25 NOTE — ED Provider Notes (Signed)
[facility] EMERGENCY DEPARTMENT Provider Note   CSN: [id number] Arrival date & time: 08/25/18  1331     History   Chief Complaint Chief Complaint  Patient presents with  . Altered Mental Status    HPI [name] is a 84 y.o. female.  HPI  84 year old female PMH significant for GAD, Hashimoto's thyroiditis, IBS, MDD who presents with acute altered mental status.  Patient unable to contribute to history due to altered mental status.  History provided by his son-in-law.  Son-in-law states that 3 days ago patient was slightly "off" you are unable to explain how.  Yesterday patient was seen by son-in-law who states that she was "talking out of her head" stating that there was someone in her house made [place] kitchen yet the kitchen was clean and known and been to her house.  Son-in-law brought patient to a colonoscopy appointment today where patient was unable to sign her name.  Staff at the colonoscopy clinic sent patient to ED for further evaluation.  Patient is without complaint.  Past Medical History:  Diagnosis Date  . Abdominal pain   . Anemia    NOS  . Anxiety   . Back pain   . Bruises easily   . Chronic diastolic CHF (congestive heart failure) ([place])    a. 06/2013 EF 65-70%.  . Chronic headaches    HISTORY OF   . Depression   . Diabetes mellitus    type II  DIET CONTROLLED  . Diarrhea   . Hepatitis    AGE 30S  . Hyperlipidemia   . Hypertension   . Mild aortic stenosis    a. 06/2013 Echo: EF 65-70%, mod LVH with focal basal hypertrophy, very mild AS.  Marland Kitchen Osteoarthritis   . Oxygen desaturation during sleep    USES 2 LITERS BEDTIME   VIA CPAP 06/2012 [facility]   . Shortness of breath    WITH EXERTION USES 2 L O2 BEDTIME  . Sleep apnea    CPAP WITH O2 2 LITERS 2013 ([facility])  . Wears dentures   . Wears glasses     Patient Active Problem List   Diagnosis Date Noted  . Common bile duct dilatation 07/09/2018  . Hypomagnesemia 12/18/2017  .  Major depressive disorder, recurrent episode with anxious distress ([place]) 02/04/2017  . Dementia arising in the senium and presenium ([place]) 06/27/2016  . Obesity, Class II, BMI 35-39.9, with comorbidity 05/15/2016  . Insomnia w/ sleep apnea 10/16/2015  . IBS (irritable bowel syndrome) 02/27/2015  . Osteoarthritis of right knee 02/10/2015  . Hashimoto's thyroiditis 05/05/2014  . Adrenal incidentaloma ([place]) 11/04/2013  . Benign paroxysmal positional vertigo 07/15/2013  . Aortic stenosis, mild 07/14/2013  . Hypokalemia 04/16/2013  . OAB (overactive bladder) 02/24/2013  . GERD with stricture 12/24/2012  . Sleep-related hypoventilation 07/30/2012  . OSA (obstructive sleep apnea) 04/16/2012  . Osteopenia 04/16/2012  . Preventative health care 05/24/2011  . Vitamin D deficiency 12/04/2010  . Hyperlipidemia with target LDL less than 130 12/03/2010  . GAD (generalized anxiety disorder) 12/03/2010  . Essential hypertension 12/03/2010    Past Surgical History:  Procedure Laterality Date  . ABDOMINAL HYSTERECTOMY    . APPENDECTOMY    . CHOLECYSTECTOMY    . JOINT REPLACEMENT     LEFT KNEE   . KNEE ARTHROPLASTY Right 03/22/2015   Procedure: COMPUTER ASSISTED TOTAL KNEE ARTHROPLASTY;  Surgeon: Marybelle Killings, MD;  Location: [facility];  Service: Orthopedics;  Laterality: Right;  .  KNEE ARTHROSCOPY     RIGHT  . LEFT AND RIGHT HEART CATHETERIZATION WITH CORONARY ANGIOGRAM N/A 10/28/2013   Procedure: LEFT AND RIGHT HEART CATHETERIZATION WITH CORONARY ANGIOGRAM;  Surgeon: Blane Ohara, MD;  Location[facility] C[facility] LAB;  Service: Cardiovascular;  Laterality: N/A;  . LEFT HEART CATHETERIZATION WITH CORONARY ANGIOGRAM N/A 11/14/2014   Procedure: LEFT HEART CATHETERIZATION WITH CORONARY ANGIOGRAM;  Surgeon: Josue Hector, MD;  Location: [facility] CATH LAB;  Service: Cardiovascular;  Laterality: N/A;  . REVISION TOTAL KNEE ARTHROPLASTY  2011  . ROTATOR CUFF REPAIR  2010  . SHOULDER ARTHROSCOPY  09/17/2012   Procedure:  ARTHROSCOPY SHOULDER;  Surgeon: Sharmon Revere, MD;  Location: [facility];  Service: Orthopedics;  Laterality: Left;  . TONSILLECTOMY    . TONSILLECTOMY       OB History   None      Home Medications    Prior to Admission medications   Medication Sig Start Date End Date Taking? Authorizing Provider  calcium carbonate (OS-CAL) 600 MG TABS tablet Take 600 mg by mouth 2 (two) times daily with a meal.    [provider]  carvedilol (COREG) 6.25 MG tablet TAKE 1 TABLET BY MOUTH TWICE A DAY WITH MEALS 07/13/18   [name], MD  chlorthalidone (HYGROTON) 25 MG tablet TAKE 1 TABLET BY MOUTH ONCE DAILY 05/29/18   [name], MD  Cholecalciferol 2000 units TABS Take 2 tablets (4,000 Units total) by mouth daily. 03/31/18   [name], MD  clonazePAM (KLONOPIN) 1 MG tablet TAKE 1 TABLET BY MOUTH TWICE A DAY IF NEEDED FOR ANXIETY 04/24/18   [name], MD  conjugated estrogens (PREMARIN) vaginal cream Place 1 Applicatorful vaginally daily. 05/15/18   Marrian Salvage, FNP  gabapentin (NEURONTIN) 100 MG capsule Take 1 capsule (100 mg total) by mouth 3 (three) times daily. 07/09/18   [name], MD  hydrALAZINE (APRESOLINE) 50 MG tablet Take 1 tablet (50 mg total) 3 (three) times daily by mouth. 10/06/17   [name], MD  MYRBETRIQ 25 MG TB24 tablet TAKE 1 TABLET BY MOUTH ONCE DAILY 05/05/18   [name], MD  omeprazole (PRILOSEC) 40 MG capsule Take 1 capsule (40 mg total) by mouth daily. 02/16/18   [name], MD  oxyCODONE-acetaminophen (PERCOCET) 10-325 MG tablet Take 1 tablet by mouth every 8 (eight) hours as needed for pain. 07/31/18   [name], MD  potassium chloride (K-DUR) 10 MEQ tablet Take 1 tablet (10 mEq total) by mouth 3 (three) times daily. 12/18/17   [name], MD  promethazine (PHENERGAN) 12.5 MG tablet Take 1 tablet (12.5 mg total) by mouth every 6 (six) hours as needed for nausea or vomiting. 07/06/18   [name], MD  temazepam  (RESTORIL) 15 MG capsule  10/14/17   [provider]  traZODone (DESYREL) 100 MG tablet TAKE 2 TABLETS BY MOUTH AT BEDTIME 08/08/18   [name], MD    Family History Family History  Problem Relation Age of Onset  . Cancer Father        prostate cancer  . Heart disease Sister   . Heart disease Brother   . Alcohol abuse Other   . Hypertension Other   . Kidney disease Other   . Mental illness Other   . Emphysema Brother     Social History Social History   Tobacco Use  . Smoking status: Never Smoker  . Smokeless tobacco: Never  Used  Substance Use Topics  . Alcohol use: No  . Drug use: No     Allergies   Amlodipine; Enalapril; Losartan potassium; Morphine and related; and Metformin   Review of Systems Review of Systems  Unable to perform ROS: Mental status change     Physical Exam Updated Vital Signs BP (!) 159/89 (BP Location: Left Arm)   Pulse 88   Temp 98 F (36.7 C)   Resp 15   Ht 5\' 8"  (1.727 m)   Wt 90.7 kg   SpO2 96%   BMI 30.41 kg/m   Physical Exam  Constitutional: She appears well-developed and well-nourished. No distress.  HENT:  Head: Normocephalic and atraumatic.  Eyes: Conjunctivae are normal.  Neck: Neck supple.  Cardiovascular: Normal rate and regular rhythm.  No murmur heard. Pulmonary/Chest: Effort normal and breath sounds normal. No respiratory distress.  Abdominal: Soft. There is no tenderness.  Musculoskeletal: She exhibits no edema.  Neurological: She is alert. She is disoriented. GCS eye subscore is 4. GCS verbal subscore is 4. GCS motor subscore is 6.  Skin: Skin is warm and dry. Capillary refill takes less than 2 seconds. She is not diaphoretic.  Psychiatric: She has a normal mood and affect.  Nursing note and vitals reviewed.    ED Treatments / Results  Labs (all labs ordered are listed, but only abnormal results are displayed) Labs Reviewed  I-STAT CHEM 8, ED - Abnormal; Notable for the following components:       Result Value   Potassium 3.1 (*)    Glucose, Bld 163 (*)    Calcium, Ion 1.03 (*)    Hemoglobin 17.0 (*)    HCT 50.0 (*)    All other components within normal limits  URINE CULTURE  URINE CULTURE  PROTIME-INR  APTT  CBC  DIFFERENTIAL  COMPREHENSIVE METABOLIC PANEL  URINALYSIS, ROUTINE W REFLEX MICROSCOPIC  URINALYSIS, COMPLETE (UACMP) WITH MICROSCOPIC  AMMONIA  RAPID URINE DRUG SCREEN, HOSP PERFORMED  I-STAT TROPONIN, ED  CBG MONITORING, ED    EKG EKG Interpretation  Date/Time:  Tuesday August 25 2018 13:48:03 EDT Ventricular Rate:  109 PR Interval:  142 QRS Duration: 72 QT Interval:  338 QTC Calculation: 455 R Axis:   55 Text Interpretation:  Sinus tachycardia Left ventricular hypertrophy with repolarization abnormality Cannot rule out Septal infarct , age undetermined Abnormal ECG Since last EKG, rate is increased ST depressions diffusely in inferior and precordial leads, likely rate-related with LVH Confirmed by Duffy Bruce [phone]) on 08/25/2018 3:56:16 PM   Radiology Ct Head Wo Contrast  Result Date: 08/25/2018 CLINICAL DATA:  Altered mental status 2 days. EXAM: CT HEAD WITHOUT CONTRAST TECHNIQUE: Contiguous axial images were obtained from the base of the skull through the vertex without intravenous contrast. COMPARISON:  08/05/2017 and 10/26/2013 FINDINGS: Brain: Ventricles, cisterns and other CSF spaces are within normal. There is mild chronic ischemic microvascular disease. Tiny old right thalamic lacunar infarct. No mass, mass effect, shift of midline structures or acute hemorrhage. No evidence of acute infarction. Vascular: No hyperdense vessel or unexpected calcification. Skull: Well-defined sclerotic focus over the left frontal skull unchanged. Sinuses/Orbits: No acute finding. Other: None. IMPRESSION: No acute findings. Mild chronic ischemic microvascular disease. Small old thalamic lacunar infarct. Electronically Signed   By: Marin Olp M.D.   On:  08/25/2018 16:09   Dg Chest Port 1 View  Result Date: 08/25/2018 CLINICAL DATA:  History of CHF.  Altered mental status. EXAM: PORTABLE CHEST 1 VIEW COMPARISON:  11/02/2017.  03/10/2015.  CT 05/19/2014. FINDINGS: Stable prominence of the superior mediastinum consistent with substernal goiter as previously noted on CT of 05/19/2014. Heart size stable. Low lung volumes mild basilar atelectasis. No focal alveolar infiltrate. No pleural effusion or pneumothorax. IMPRESSION: 1.  Substernal goiter. 2. Low lung volumes with mild basilar atelectasis. Exam otherwise unremarkable. Electronically Signed   By: Marcello Moores  Register   On: 08/25/2018 15:04    Procedures Procedures (including critical care time)  Medications Ordered in ED Medications - No data to display   Initial Impression / Assessment and Plan / ED Course  I have reviewed the triage vital signs and the nursing notes.  Pertinent labs & imaging results that were available during my care of the patient were reviewed by me and considered in my medical decision making (see chart for details).     84 year old female PMH significant for GAD, Hashimoto's thyroiditis, IBS, MDD who presents with acute altered mental status.  History as above.  Broad differential for altered mental status and gaze.  Labs and imaging obtained and pending.  Patient CARE handed off to Dr. Benjamine Mola.  Please see her note for further evaluation and management and ultimate disposition of patient.  Patient condition by attending Dr. Vanita Panda, who agreed with plan.  Final Clinical Impressions(s) / ED Diagnoses   Final diagnoses:  None    ED Discharge Orders    None       Keenan Bachelor, MD 08/25/18 1636    Carmin Muskrat, MD 08/28/18 1751

## 2018-08-25 NOTE — ED Notes (Signed)
Dr. Leonel Ramsay at bedside to assess pt

## 2018-08-25 NOTE — ED Notes (Addendum)
Attempted to help pt out of bed to wheelchair to go to bathroom.  Pt appeared to be confused on how to stand.  Once in wheelchair pt would not respond verbally with a blank stare on her face.  Pt would not follow any commands.   Dr.; Myrene Buddy made aware and in room to assess pt.

## 2018-08-25 NOTE — Telephone Encounter (Signed)
We can not text the daughter.   But I called and LVM to inform her mother would need an appointment to come in and to please call back and set one up.   Patients provider is out of office until 10/7

## 2018-08-25 NOTE — ED Provider Notes (Signed)
Transfer of Care Note I assumed care of patient from Dr. Tobi Bastos at 1600.  Agree with history, physical exam and plan.  See their note for further details.    Briefly, the patient is a 84yo female with PMHx of GAD, MDD, Hashimoto's thyroiditis, and remote hx of seizure  who presents with confusion that has been worsening over the last 2-3 days.  Labs and imaging pending.  Concern for encephalopathy due to infectious process.  Current plan is as follows: labs and imaging pending.  Reassessment: I personally reassessed the patient.  I reviewed her labs and imaging.  Family and patient interviewed further.  She was undergoing a bowel prep for colonoscopy this morning and had significant diarrhea.  Patient able to converse, asking when she could leave. At 1730 patient with acute decompensation and change in mental status.  She was not following commands nor conversant.  Concern for stroke vs seizure given twitching therefore neurology consulted.    CT stroke (perfusion) study obtained and without large vessel occlusion, perfusion defect, or core infarct.  She was given Ativan and loaded with Keppra with improvement of mental status.  She is now conversant.  CT A/P obtained given elevated t bili of 2.6.  LFTs wnl.  No acute intra-abdominal or pelvic abnormality identified.  S/p cholecystectomy with stable extrahepatic bile duct dilation.  No diverticulitis.  Given zosyn initially as concern for infectious process as leukocytosis of 15 however none identified.  Remainder of labs unremarkable as negative UDS, lactic acid, UA, and ammonia.  Will admit to hospitalist for further management and work-up.  Family updated at bedisde.   HDS at transfer.  The care of this patient was supervised by Dr. Ellender Hose, who agreed with the plan and management of the patient.   Clinical Impression: 1. Altered mental status, unspecified altered mental status type     ED Dispo: Demetrio Lapping, MD 08/26/18  [id number]    Duffy Bruce, MD 08/26/18 1236

## 2018-08-26 ENCOUNTER — Other Ambulatory Visit: Payer: Self-pay

## 2018-08-26 ENCOUNTER — Observation Stay (HOSPITAL_COMMUNITY): Payer: Medicare HMO

## 2018-08-26 DIAGNOSIS — E876 Hypokalemia: Secondary | ICD-10-CM

## 2018-08-26 DIAGNOSIS — N390 Urinary tract infection, site not specified: Secondary | ICD-10-CM

## 2018-08-26 DIAGNOSIS — G9341 Metabolic encephalopathy: Secondary | ICD-10-CM | POA: Diagnosis not present

## 2018-08-26 DIAGNOSIS — R569 Unspecified convulsions: Secondary | ICD-10-CM | POA: Diagnosis not present

## 2018-08-26 DIAGNOSIS — F419 Anxiety disorder, unspecified: Secondary | ICD-10-CM | POA: Diagnosis present

## 2018-08-26 LAB — LIPID PANEL
CHOL/HDL RATIO: 2.8 ratio
CHOLESTEROL: 194 mg/dL (ref 0–200)
HDL: 69 mg/dL (ref >40)
LDL CALC: 106 mg/dL — AB (ref 0–99)
TRIGLYCERIDES: 95 mg/dL (ref <150)
VLDL: 19 mg/dL (ref 0–40)

## 2018-08-26 LAB — BRAIN NATRIURETIC PEPTIDE: B Natriuretic Peptide: 292.5 pg/mL — ABNORMAL HIGH (ref 0.0–100.0)

## 2018-08-26 LAB — GLUCOSE, CAPILLARY
GLUCOSE-CAPILLARY: 106 mg/dL — AB (ref 70–99)
Glucose-Capillary: 117 mg/dL — ABNORMAL HIGH (ref 70–99)
Glucose-Capillary: 87 mg/dL (ref 70–99)
Glucose-Capillary: 110 mg/dL — ABNORMAL HIGH (ref 70–99)

## 2018-08-26 MED ORDER — MIRABEGRON ER 25 MG PO TB24
25.0000 mg | ORAL_TABLET | Freq: Every day | ORAL | Status: DC
  Administered 2018-08-26 – 2018-08-28 (×3): 25 mg via ORAL
  Filled 2018-08-26 (×3): qty 1

## 2018-08-26 MED ORDER — CHLORTHALIDONE 25 MG PO TABS
25.0000 mg | ORAL_TABLET | Freq: Every day | ORAL | Status: DC
  Administered 2018-08-26 – 2018-08-28 (×3): 25 mg via ORAL
  Filled 2018-08-26 (×3): qty 1

## 2018-08-26 MED ORDER — ACETAMINOPHEN 325 MG PO TABS
650.0000 mg | ORAL_TABLET | Freq: Four times a day (QID) | ORAL | Status: DC | PRN
  Administered 2018-08-27 – 2018-08-28 (×5): 650 mg via ORAL
  Filled 2018-08-26 (×6): qty 2

## 2018-08-26 MED ORDER — GABAPENTIN 600 MG PO TABS
300.0000 mg | ORAL_TABLET | Freq: Three times a day (TID) | ORAL | Status: DC
  Administered 2018-08-26 – 2018-08-28 (×7): 300 mg via ORAL
  Filled 2018-08-26 (×7): qty 1

## 2018-08-26 MED ORDER — HYDRALAZINE HCL 20 MG/ML IJ SOLN
10.0000 mg | INTRAMUSCULAR | Status: DC | PRN
  Administered 2018-08-26: 10 mg via INTRAVENOUS
  Filled 2018-08-26: qty 1

## 2018-08-26 MED ORDER — POTASSIUM CHLORIDE CRYS ER 20 MEQ PO TBCR
40.0000 meq | EXTENDED_RELEASE_TABLET | Freq: Once | ORAL | Status: AC
  Administered 2018-08-26: 40 meq via ORAL
  Filled 2018-08-26: qty 2

## 2018-08-26 MED ORDER — CARVEDILOL 6.25 MG PO TABS
6.2500 mg | ORAL_TABLET | Freq: Two times a day (BID) | ORAL | Status: DC
  Administered 2018-08-26 – 2018-08-28 (×5): 6.25 mg via ORAL
  Filled 2018-08-26 (×5): qty 1

## 2018-08-26 NOTE — Progress Notes (Signed)
EEG Completed; Results Pending  

## 2018-08-26 NOTE — Telephone Encounter (Signed)
Can you advise in PCP absence? #[phone] last filled on 07/24/2018 per database  LOV with PCP was on 07/07/2018

## 2018-08-26 NOTE — Progress Notes (Addendum)
Subjective: Patient just received her EEG.  She is alert and oriented.  She follows all commands.  I talked to her granddaughter who states she still having episodes of confusion along with expressive aphasia.  She stated that she was seen hallucinations prior to coming in.  Granddaughter states she did take her colonoscopy prep on Saturday and also was told to stop taking all of her medications which included Klonopin, Ativan, and oxycodone.  She was to have her colonoscopy on Monday however due to the confusion she did not.  I talked to the granddaughter about if she restarted her medications and she is unclear.  Currently she is calling 1 of the family members to find out if she did restart them.  Patient did express that she does not want to be on a narcotic for her back pain.  Exam: Vitals:   08/26/18 0400 08/26/18 0800  BP: (!) 142/82 (!) 177/83  Pulse:  82  Resp:  19  Temp:  98.7 F (37.1 C)  SpO2:  98%    Physical Exam   HEENT-  Normocephalic, no lesions, without obvious abnormality.  Normal external eye and conjunctiva.   Extremities- Warm, dry and intact Musculoskeletal-no joint tenderness, deformity or swelling Skin-warm and dry, no hyperpigmentation, vitiligo, or suspicious lesions    Neuro:  Mental Status: Alert, oriented, thought content appropriate.  Speech fluent without evidence of aphasia.  Able to follow 3 step commands without difficulty. Cranial Nerves: II:  Visual fields grossly normal,  III,IV, VI: ptosis not present, extra-ocular motions intact bilaterally pupils equal, round, reactive to light and accommodation V,VII: smile symmetric, facial light touch sensation normal bilaterally VIII: hearing normal bilaterally IX,X: uvula rises midline XI: bilateral shoulder shrug XII: midline tongue extension Motor: Moving all extremities antigravity Sensory: Pinprick and light touch intact throughout, bilaterally Plantars: Right: downgoing   Left:  downgoing Cerebellar: normal finger-to-nose,     Medications:  Scheduled:  Current Facility-Administered Medications  Medication Dose Route Frequency Provider Last Rate Last Dose  . 0.9 %  sodium chloride infusion   Intravenous Continuous Ivor Costa, MD 100 mL/hr at 08/26/18 0950    . acetaminophen (TYLENOL) suppository 650 mg  650 mg Rectal Q6H PRN Ivor Costa, MD      . cefTRIAXone (ROCEPHIN) 1 g in sodium chloride 0.9 % 100 mL IVPB  1 g Intravenous Q24H Ivor Costa, MD 200 mL/hr at 08/26/18 0539 1 g at 08/26/18 0539  . enoxaparin (LOVENOX) injection 40 mg  40 mg Subcutaneous Q24H Ivor Costa, MD   40 mg at 08/26/18 0539  . famotidine (PEPCID) IVPB 20 mg premix  20 mg Intravenous Q12H Ivor Costa, MD 100 mL/hr at 08/26/18 1001 20 mg at 08/26/18 1001  . hydrALAZINE (APRESOLINE) injection 5 mg  5 mg Intravenous Q2H PRN Ivor Costa, MD   5 mg at 08/26/18 0345  . levETIRAcetam (KEPPRA) IVPB 500 mg/100 mL premix  500 mg Intravenous Q12H Ivor Costa, MD 400 mL/hr at 08/26/18 0531 500 mg at 08/26/18 0531  . LORazepam (ATIVAN) injection 0.25 mg  0.25 mg Intravenous BID Ivor Costa, MD   0.25 mg at 08/26/18 0951  . LORazepam (ATIVAN) injection 1 mg  1 mg Intravenous Q2H PRN Ivor Costa, MD      . ondansetron [facility]) injection 4 mg  4 mg Intravenous Q8H PRN Ivor Costa, MD        Pertinent Labs/Diagnostics: EEG pending     Etta Quill PA-C [facility] [phone]  I have seen  the patient and reviewed the above note.  She is greatly improved and much more interactive.  Assessment: 84 year old female with 2 days of mild confusion followed by an episode concerning for seizure with postictal aphasia and hemianopia.  Given a history of seizure as well as her age, I would favor antiepileptic therapy.  She was already taking gabapentin and therefore I would favor increasing this dose to a an antiepileptic dose as opposed to adding a new medication.  Recommendations: 1) increase gabapentin  to 300 3 times daily, if she becomes dizzy or sedation is an issue, could do 200 3 times daily for 1 week and then 300 3 times daily 2) MRI of the brain 3) neurology will follow  Roland Rack, MD [facility] [phone]  If 7pm- 7am, please page neurology on call as listed in [place].  08/26/2018, 11:14 AM

## 2018-08-26 NOTE — Telephone Encounter (Signed)
Done erx 

## 2018-08-26 NOTE — Progress Notes (Signed)
PROGRESS NOTE                                                                                                                                                                                                             Patient Demographics:    [name], is a 84 y.o. female, DOB - 10/22/1933, ZOX:[id number]  Admit date - 08/25/2018   Admitting Physician Ivor Costa, MD  Outpatient Primary MD for the patient is [name], MD  LOS - 0  Outpatient Specialists: None  Chief Complaint  Patient presents with  . Altered Mental Status       Brief Narrative 84 year old female with history of hypertension, diet-controlled diabetes mellitus, GERD, anxiety, aortic stenosis, OSA on CPAP, obesity, remote seizures not on medications presented from home with altered mental status.  As per her daughter she was found to be confused since 2 days prior to admission.  At baseline she is well oriented.  So I suspect she did not have any facial droop, visual symptoms, fall or weakness.  On the afternoon of admission she had mild body twitching and then could not speak.  She was undergoing a bowel prep for colonoscopy that was scheduled on the day of admission.  Patient reports that she is having off-and-on dysuria.  Patient was sent from the GI clinic to the ED.  In the ED she had normal vitals except for mild tachycardia, will receive 15 point 1K, ammonia of 22, UA with trace leukocytes and WBC, potassium of 3.4.  Neurology consulted and CT of the head and neck was done which was negative for large vessel occlusion.  She then had a CTA of brain perfusion which again was negative for large vessel occlusion or acute intracranial abnormalities.  CT of the abdomen and pelvis was done which was negative for acute findings. Given concern for seizures patient placed on observation and EEG ordered.     Subjective:   Patient reports feeling anxious.   Denies any weakness or numbness.   Assessment  & Plan :    Principal Problem:   Acute metabolic encephalopathy Seizure versus secondary to UTI.  CT head, angios head and neck and CT brain perfusion scan negative for acute abnormality or large vessel occlusion.  Neuro hospitalist consult appreciated.  Given suspicion for seizure order for EEG.  She  has been loaded with Keppra and started on empiric Keppra.  Continue neurochecks and seizure precaution. Continue PRN Ativan for seizures. Swallow evaluation pending.  Further recommendations per neurology. At baseline patient is well alert and oriented and per granddaughter she is not at her baseline yet and still has some episode of confusion.  Monitor for now.  Active Problems: UTI Has mild leukocytosis and WBC and urine patient does report some dysuria.  On empiric Rocephin.  Follow cultures.    Essential hypertension  Home meds held on admission.  Will resume as blood pressure elevated.    OSA (obstructive sleep apnea)   Sleep-related hypoventilation Continue nighttime CPAP.    Hypokalemia Replenished.  Chronic anxiety Klonopin held.  PRN low-dose Ativan.     Chronic diastolic CHF (congestive heart failure) (HCC) Euvolemic.  Continue home meds    Hypercalcemia   Anxiety       Code Status : Full code  Family Communication  : Granddaughter at bedside.  Disposition Plan  : Home possibly tomorrow once work-up complete and symptoms improved.  Obtain PT eval.  Per granddaughter patient lives with her daughter who works and does not have anyone at home during the day.  Barriers For Discharge : Active symptoms  Consults  : Neurology  Procedures  : CT head, CT angiogram head and neck, CT brain perfusion scan, EEG  DVT Prophylaxis  :  Lovenox -  Lab Results  Component Value Date   PLT 210 08/25/2018    Antibiotics  :    Anti-infectives (From admission, onward)   Start     Dose/Rate Route Frequency Ordered Stop    08/26/18 0600  cefTRIAXone (ROCEPHIN) 1 g in sodium chloride 0.9 % 100 mL IVPB     1 g 200 mL/hr over 30 Minutes Intravenous Every 24 hours 08/25/18 2211     08/25/18 1830  piperacillin-tazobactam (ZOSYN) IVPB 3.375 g     3.375 g 100 mL/hr over 30 Minutes Intravenous  Once 08/25/18 1815 08/25/18 1955        Objective:   Vitals:   08/26/18 0109 08/26/18 0300 08/26/18 0400 08/26/18 0800  BP: (!) 185/107 (!) 185/97 (!) 142/82 (!) 177/83  Pulse:    82  Resp: 20   19  Temp: 99.7 F (37.6 C) 99.4 F (37.4 C)  98.7 F (37.1 C)  TempSrc: Oral Oral  Oral  SpO2: 97% 96%  98%  Weight:      Height:        Wt Readings from Last 3 Encounters:  08/25/18 90.7 kg  07/07/18 85.6 kg  07/02/18 86.8 kg     Intake/Output Summary (Last 24 hours) at 08/26/2018 1129 Last data filed at 08/26/2018 1100 Gross per 24 hour  Intake 150 ml  Output 3 ml  Net 147 ml     Physical Exam  Gen: not in distress, fatigued HEENT: no pallor, moist mucosa, supple neck Chest: clear b/l, no added sounds CVS: Normal S1 and S2, 3/6 systolic murmur, no rubs or gallop GI: soft, NT, ND, BS+ Musculoskeletal: warm, no edema CNS: AAOX2, nonfocal    Data Review:    CBC Recent Labs  Lab 08/25/18 1453 08/25/18 1555  WBC  --  15.1*  HGB 17.0* 15.8*  HCT 50.0* 48.8*  PLT  --  210  MCV  --  93.8  MCH  --  30.4  MCHC  --  32.4  RDW  --  12.7  LYMPHSABS  --  1.8  MONOABS  --  1.1*  EOSABS  --  0.0  BASOSABS  --  0.1    Chemistries  Recent Labs  Lab 08/25/18 1453 08/25/18 1555  NA 141 141  K 3.1* 3.4*  CL 105 102  CO2  --  27  GLUCOSE 163* 133*  BUN 10 9  CREATININE 0.70 0.88  CALCIUM  --  10.4*  AST  --  34  ALT  --  16  ALKPHOS  --  49  BILITOT  --  2.6*   ------------------------------------------------------------------------------------------------------------------ Recent Labs    08/26/18 0116  CHOL 194  HDL 69  LDLCALC 106*  TRIG 95  CHOLHDL 2.8    Lab Results    Component Value Date   HGBA1C 5.3 10/27/2017   ------------------------------------------------------------------------------------------------------------------ Recent Labs    08/25/18 1803  TSH 2.350   ------------------------------------------------------------------------------------------------------------------ No results for input(s): VITAMINB12, FOLATE, FERRITIN, TIBC, IRON, RETICCTPCT in the last 72 hours.  Coagulation profile Recent Labs  Lab 08/25/18 1555  INR 1.03    No results for input(s): DDIMER in the last 72 hours.  Cardiac Enzymes No results for input(s): CKMB, TROPONINI, MYOGLOBIN in the last 168 hours.  Invalid input(s): CK ------------------------------------------------------------------------------------------------------------------    Component Value Date/Time   BNP 292.5 (H) 08/26/2018 0116    Inpatient Medications  Scheduled Meds: . enoxaparin (LOVENOX) injection  40 mg Subcutaneous Q24H  . LORazepam  0.25 mg Intravenous BID   Continuous Infusions: . sodium chloride 100 mL/hr at 08/26/18 0950  . cefTRIAXone (ROCEPHIN)  IV 1 g (08/26/18 0539)  . famotidine (PEPCID) IV 20 mg (08/26/18 1001)  . levETIRAcetam 500 mg (08/26/18 0531)   PRN Meds:.acetaminophen, hydrALAZINE, LORazepam, ondansetron (ZOFRAN) IV  Micro Results Recent Results (from the past 240 hour(s))  Culture, blood (Routine X 2) w Reflex to ID Panel     Status: None (Preliminary result)   Collection Time: 08/26/18  1:06 AM  Result Value Ref Range Status   Specimen Description BLOOD LEFT HAND  Final   Special Requests   Final    BOTTLES DRAWN AEROBIC ONLY Blood Culture adequate volume   Culture   Final    NO GROWTH < 12 HOURS Performed at [facility], [place] [address], [place], [place] [address]    Report Status PENDING  Incomplete  Culture, blood (Routine X 2) w Reflex to ID Panel     Status: None (Preliminary result)   Collection Time: 08/26/18  1:15 AM  Result  Value Ref Range Status   Specimen Description BLOOD RIGHT HAND  Final   Special Requests   Final    BOTTLES DRAWN AEROBIC ONLY Blood Culture adequate volume   Culture   Final    NO GROWTH < 12 HOURS Performed at [facility], [place] [address] [place], [place] [address]    Report Status PENDING  Incomplete    Radiology Reports Ct Angio Head W Or Wo Contrast  Result Date: 08/25/2018 CLINICAL DATA:  Initial evaluation for acute altered mental status. EXAM: CT ANGIOGRAPHY HEAD AND NECK CT PERFUSION BRAIN TECHNIQUE: Multidetector CT imaging of the head and neck was performed using the standard protocol during bolus administration of intravenous contrast. Multiplanar CT image reconstructions and MIPs were obtained to evaluate the vascular anatomy. Carotid stenosis measurements (when applicable) are obtained utilizing NASCET criteria, using the distal internal carotid diameter as the denominator. Multiphase CT imaging of the brain was performed following IV bolus contrast injection. Subsequent parametric perfusion maps were calculated using RAPID software. CONTRAST:  121mL ISOVUE-370 IOPAMIDOL (ISOVUE-370) INJECTION 76% COMPARISON:  Prior CT from earlier the same day. FINDINGS: CT HEAD FINDINGS Brain: Atrophy with chronic microvascular ischemic disease again noted, stable. Small remote right thalamic and right cerebellar infarcts noted. No evidence for acute or interval intracranial hemorrhage. No acute large vessel territory infarct. No mass lesion, midline shift or mass effect. No hydrocephalus. No extra-axial fluid collection. Vascular: No hyperdense vessel. Scattered vascular calcifications noted within the carotid siphons. Skull: Scalp soft tissues and calvarium within normal limits. Sinuses/Orbits: Right maxillary sinus retention cyst. Acute on chronic right sphenoid sinusitis. Mastoid air cells and middle ear cavities are clear. Globes and orbital soft tissues within normal limits. Other: None.  CTA NECK FINDINGS Aortic arch: Visualized aortic arch of normal caliber with normal branch pattern. Incidental note made of a bovine arch with common origin of the left common and brachiocephalic arteries. Mild atherosclerotic change about the arch and origin of the great vessels without hemodynamically significant stenosis. Visualized subclavian arteries widely patent. Right carotid system: Right common carotid artery patent from its origin to the bifurcation without stenosis. A centric calcified plaque about the proximal right ICA with associated stenosis of up to 70% by NASCET criteria. Right ICA tortuous but otherwise patent to the skull base without stenosis, dissection, or occlusion Left carotid system: Left common carotid artery tortuous proximally but widely patent to the bifurcation. Scattered calcified plaque about the proximal left ICA with associated stenosis of up to 75-80% by NASCET criteria. Left ICA tortuous but otherwise patent to the skull base. Both internal carotid arteries medialized into the retropharyngeal space. Vertebral arteries: Both of the vertebral arteries arise from the subclavian arteries. Left vertebral artery dominant. Mild plaque at the origin of the vertebral arteries bilaterally without significant stenosis. Vertebral arteries widely patent within the neck without stenosis, dissection, or occlusion. Skeleton: No acute osseous abnormality. No discrete osseous lesions. Prominent osteoarthritic changes about the C1-2 articulation. Advanced right-sided facet arthrosis at C2-3. Other neck: Thyroid diffuse lead enlarged and heterogeneous in appearance, likely related to goiter. Soft tissues of the neck demonstrate no acute finding. Upper chest: Visualized upper chest and mediastinum within normal limits. Small focus of tree-in-bud nodular opacity at the posterior left upper lobe (series 11, image 159), consistent with mild small airways disease. Review of the MIP images confirms the  above findings CTA HEAD FINDINGS Anterior circulation: Petrous segments widely patent bilaterally. Calcified plaque within the cavernous/supraclinoid ICAs with resultant mild to moderate multifocal narrowing, right slightly worse than left. ICA termini widely patent. A1 segments patent bilaterally. Normal anterior communicating artery. Anterior cerebral arteries patent to their distal aspects without stenosis. No M1 stenosis or occlusion. No proximal M2 occlusion. Distal MCA branches well perfused and symmetric. Posterior circulation: Focal non stenotic plaque noted within the dominant left V4 segment. Left vertebral artery otherwise widely patent to the vertebrobasilar junction. Right vertebral artery diminutive but widely patent as well. Posterior inferior cerebral arteries patent bilaterally. Basilar tortuous but patent to its distal aspect without high-grade stenosis. Superior cerebral arteries patent bilaterally. Right PCA supplied via the basilar. Fetal type origin of the left PCA. Single short-segment moderate left P2 stenosis noted. PCAs otherwise widely patent to their distal aspects. Venous sinuses: Not well assessed due to arterial timing of the contrast bolus, but grossly patent. Anatomic variants: Fetal type left PCA.  No intracranial aneurysm. Delayed phase: Not performed. Review of the MIP images confirms the above findings CT Brain Perfusion Findings: CBF (<30%) Volume: 73mL Perfusion (Tmax>6.0s) volume:  53mL Mismatch Volume: 25mL Infarction Location:Negative. IMPRESSION: 1. Negative CTA with no large vessel occlusion identified. 2. Negative CT perfusion with no evidence for core infarct or perfusion abnormality. 3. Atheromatous stenoses about the carotid bifurcations bilaterally, measuring up to 70% on the right and 75-80% on the left. 4. Additional mild scattered atherosclerotic change elsewhere within the major arterial vasculature of the head and neck as above. No other high-grade or correctable  stenosis. 5. Thyroid goiter. Electronically Signed   By: Jeannine Boga M.D.   On: 08/25/2018 20:03   Ct Head Wo Contrast  Result Date: 08/25/2018 CLINICAL DATA:  Altered mental status 2 days. EXAM: CT HEAD WITHOUT CONTRAST TECHNIQUE: Contiguous axial images were obtained from the base of the skull through the vertex without intravenous contrast. COMPARISON:  08/05/2017 and 10/26/2013 FINDINGS: Brain: Ventricles, cisterns and other CSF spaces are within normal. There is mild chronic ischemic microvascular disease. Tiny old right thalamic lacunar infarct. No mass, mass effect, shift of midline structures or acute hemorrhage. No evidence of acute infarction. Vascular: No hyperdense vessel or unexpected calcification. Skull: Well-defined sclerotic focus over the left frontal skull unchanged. Sinuses/Orbits: No acute finding. Other: None. IMPRESSION: No acute findings. Mild chronic ischemic microvascular disease. Small old thalamic lacunar infarct. Electronically Signed   By: Marin Olp M.D.   On: 08/25/2018 16:09   Ct Angio Neck W Or Wo Contrast  Result Date: 08/25/2018 CLINICAL DATA:  Initial evaluation for acute altered mental status. EXAM: CT ANGIOGRAPHY HEAD AND NECK CT PERFUSION BRAIN TECHNIQUE: Multidetector CT imaging of the head and neck was performed using the standard protocol during bolus administration of intravenous contrast. Multiplanar CT image reconstructions and MIPs were obtained to evaluate the vascular anatomy. Carotid stenosis measurements (when applicable) are obtained utilizing NASCET criteria, using the distal internal carotid diameter as the denominator. Multiphase CT imaging of the brain was performed following IV bolus contrast injection. Subsequent parametric perfusion maps were calculated using RAPID software. CONTRAST:  157mL ISOVUE-370 IOPAMIDOL (ISOVUE-370) INJECTION 76% COMPARISON:  Prior CT from earlier the same day. FINDINGS: CT HEAD FINDINGS Brain: Atrophy with  chronic microvascular ischemic disease again noted, stable. Small remote right thalamic and right cerebellar infarcts noted. No evidence for acute or interval intracranial hemorrhage. No acute large vessel territory infarct. No mass lesion, midline shift or mass effect. No hydrocephalus. No extra-axial fluid collection. Vascular: No hyperdense vessel. Scattered vascular calcifications noted within the carotid siphons. Skull: Scalp soft tissues and calvarium within normal limits. Sinuses/Orbits: Right maxillary sinus retention cyst. Acute on chronic right sphenoid sinusitis. Mastoid air cells and middle ear cavities are clear. Globes and orbital soft tissues within normal limits. Other: None. CTA NECK FINDINGS Aortic arch: Visualized aortic arch of normal caliber with normal branch pattern. Incidental note made of a bovine arch with common origin of the left common and brachiocephalic arteries. Mild atherosclerotic change about the arch and origin of the great vessels without hemodynamically significant stenosis. Visualized subclavian arteries widely patent. Right carotid system: Right common carotid artery patent from its origin to the bifurcation without stenosis. A centric calcified plaque about the proximal right ICA with associated stenosis of up to 70% by NASCET criteria. Right ICA tortuous but otherwise patent to the skull base without stenosis, dissection, or occlusion Left carotid system: Left common carotid artery tortuous proximally but widely patent to the bifurcation. Scattered calcified plaque about the proximal left ICA with associated stenosis of up to 75-80% by NASCET criteria. Left ICA tortuous but otherwise patent to  the skull base. Both internal carotid arteries medialized into the retropharyngeal space. Vertebral arteries: Both of the vertebral arteries arise from the subclavian arteries. Left vertebral artery dominant. Mild plaque at the origin of the vertebral arteries bilaterally without  significant stenosis. Vertebral arteries widely patent within the neck without stenosis, dissection, or occlusion. Skeleton: No acute osseous abnormality. No discrete osseous lesions. Prominent osteoarthritic changes about the C1-2 articulation. Advanced right-sided facet arthrosis at C2-3. Other neck: Thyroid diffuse lead enlarged and heterogeneous in appearance, likely related to goiter. Soft tissues of the neck demonstrate no acute finding. Upper chest: Visualized upper chest and mediastinum within normal limits. Small focus of tree-in-bud nodular opacity at the posterior left upper lobe (series 11, image 159), consistent with mild small airways disease. Review of the MIP images confirms the above findings CTA HEAD FINDINGS Anterior circulation: Petrous segments widely patent bilaterally. Calcified plaque within the cavernous/supraclinoid ICAs with resultant mild to moderate multifocal narrowing, right slightly worse than left. ICA termini widely patent. A1 segments patent bilaterally. Normal anterior communicating artery. Anterior cerebral arteries patent to their distal aspects without stenosis. No M1 stenosis or occlusion. No proximal M2 occlusion. Distal MCA branches well perfused and symmetric. Posterior circulation: Focal non stenotic plaque noted within the dominant left V4 segment. Left vertebral artery otherwise widely patent to the vertebrobasilar junction. Right vertebral artery diminutive but widely patent as well. Posterior inferior cerebral arteries patent bilaterally. Basilar tortuous but patent to its distal aspect without high-grade stenosis. Superior cerebral arteries patent bilaterally. Right PCA supplied via the basilar. Fetal type origin of the left PCA. Single short-segment moderate left P2 stenosis noted. PCAs otherwise widely patent to their distal aspects. Venous sinuses: Not well assessed due to arterial timing of the contrast bolus, but grossly patent. Anatomic variants: Fetal type left  PCA.  No intracranial aneurysm. Delayed phase: Not performed. Review of the MIP images confirms the above findings CT Brain Perfusion Findings: CBF (<30%) Volume: 25mL Perfusion (Tmax>6.0s) volume: 13mL Mismatch Volume: 21mL Infarction Location:Negative. IMPRESSION: 1. Negative CTA with no large vessel occlusion identified. 2. Negative CT perfusion with no evidence for core infarct or perfusion abnormality. 3. Atheromatous stenoses about the carotid bifurcations bilaterally, measuring up to 70% on the right and 75-80% on the left. 4. Additional mild scattered atherosclerotic change elsewhere within the major arterial vasculature of the head and neck as above. No other high-grade or correctable stenosis. 5. Thyroid goiter. Electronically Signed   By: Jeannine Boga M.D.   On: 08/25/2018 20:03   Ct Abdomen Pelvis W Contrast  Result Date: 08/25/2018 CLINICAL DATA:  Acute abdominal pain EXAM: CT ABDOMEN AND PELVIS WITH CONTRAST TECHNIQUE: Multidetector CT imaging of the abdomen and pelvis was performed using the standard protocol following bolus administration of intravenous contrast. CONTRAST:  1104mL ISOVUE-370 IOPAMIDOL (ISOVUE-370) INJECTION 76% COMPARISON:  CT 07/07/2018, 05/20/2014 FINDINGS: Lower chest: Lung bases demonstrate no acute consolidation or effusion. Heart size upper limits of normal Hepatobiliary: Status post cholecystectomy. No focal hepatic abnormality. Stable slight enlargement of the extrahepatic bile duct. Pancreas: Unremarkable. No pancreatic ductal dilatation or surrounding inflammatory changes. Spleen: Normal in size without focal abnormality. Adrenals/Urinary Tract: Adrenal glands are stable. 2.7 cm right adrenal mass. Left adrenal gland is normal. No hydronephrosis. Subcentimeter hypodense renal lesions too small to further characterize. Bladder normal Stomach/Bowel: Stomach is nonenlarged. No dilated small bowel. No colon wall thickening. Sigmoid colon diverticula without acute  inflammatory process. Vascular/Lymphatic: Nonaneurysmal aorta. Extensive aortic atherosclerosis. No significantly enlarged lymph nodes. Reproductive: Status  post hysterectomy. No adnexal masses. Other: Negative for free air or free fluid. Fat within the bilateral inguinal regions. Musculoskeletal: Degenerative changes of the spine. No acute or suspicious abnormality. IMPRESSION: 1. No CT evidence for acute intra-abdominal or pelvic abnormality. 2. Status post cholecystectomy. Slight prominence of the extrahepatic bile duct but without interval increase in dilatation. No intra hepatic biliary dilatation is visualized. 3. Stable 2.7 cm right adrenal gland adenoma 4. Sigmoid colon diverticular disease without acute inflammatory process. Electronically Signed   By: Donavan Foil M.D.   On: 08/25/2018 19:18   Ct Cerebral Perfusion W Contrast  Result Date: 08/25/2018 CLINICAL DATA:  Initial evaluation for acute altered mental status. EXAM: CT ANGIOGRAPHY HEAD AND NECK CT PERFUSION BRAIN TECHNIQUE: Multidetector CT imaging of the head and neck was performed using the standard protocol during bolus administration of intravenous contrast. Multiplanar CT image reconstructions and MIPs were obtained to evaluate the vascular anatomy. Carotid stenosis measurements (when applicable) are obtained utilizing NASCET criteria, using the distal internal carotid diameter as the denominator. Multiphase CT imaging of the brain was performed following IV bolus contrast injection. Subsequent parametric perfusion maps were calculated using RAPID software. CONTRAST:  172mL ISOVUE-370 IOPAMIDOL (ISOVUE-370) INJECTION 76% COMPARISON:  Prior CT from earlier the same day. FINDINGS: CT HEAD FINDINGS Brain: Atrophy with chronic microvascular ischemic disease again noted, stable. Small remote right thalamic and right cerebellar infarcts noted. No evidence for acute or interval intracranial hemorrhage. No acute large vessel territory infarct. No  mass lesion, midline shift or mass effect. No hydrocephalus. No extra-axial fluid collection. Vascular: No hyperdense vessel. Scattered vascular calcifications noted within the carotid siphons. Skull: Scalp soft tissues and calvarium within normal limits. Sinuses/Orbits: Right maxillary sinus retention cyst. Acute on chronic right sphenoid sinusitis. Mastoid air cells and middle ear cavities are clear. Globes and orbital soft tissues within normal limits. Other: None. CTA NECK FINDINGS Aortic arch: Visualized aortic arch of normal caliber with normal branch pattern. Incidental note made of a bovine arch with common origin of the left common and brachiocephalic arteries. Mild atherosclerotic change about the arch and origin of the great vessels without hemodynamically significant stenosis. Visualized subclavian arteries widely patent. Right carotid system: Right common carotid artery patent from its origin to the bifurcation without stenosis. A centric calcified plaque about the proximal right ICA with associated stenosis of up to 70% by NASCET criteria. Right ICA tortuous but otherwise patent to the skull base without stenosis, dissection, or occlusion Left carotid system: Left common carotid artery tortuous proximally but widely patent to the bifurcation. Scattered calcified plaque about the proximal left ICA with associated stenosis of up to 75-80% by NASCET criteria. Left ICA tortuous but otherwise patent to the skull base. Both internal carotid arteries medialized into the retropharyngeal space. Vertebral arteries: Both of the vertebral arteries arise from the subclavian arteries. Left vertebral artery dominant. Mild plaque at the origin of the vertebral arteries bilaterally without significant stenosis. Vertebral arteries widely patent within the neck without stenosis, dissection, or occlusion. Skeleton: No acute osseous abnormality. No discrete osseous lesions. Prominent osteoarthritic changes about the C1-2  articulation. Advanced right-sided facet arthrosis at C2-3. Other neck: Thyroid diffuse lead enlarged and heterogeneous in appearance, likely related to goiter. Soft tissues of the neck demonstrate no acute finding. Upper chest: Visualized upper chest and mediastinum within normal limits. Small focus of tree-in-bud nodular opacity at the posterior left upper lobe (series 11, image 159), consistent with mild small airways disease. Review of the MIP  images confirms the above findings CTA HEAD FINDINGS Anterior circulation: Petrous segments widely patent bilaterally. Calcified plaque within the cavernous/supraclinoid ICAs with resultant mild to moderate multifocal narrowing, right slightly worse than left. ICA termini widely patent. A1 segments patent bilaterally. Normal anterior communicating artery. Anterior cerebral arteries patent to their distal aspects without stenosis. No M1 stenosis or occlusion. No proximal M2 occlusion. Distal MCA branches well perfused and symmetric. Posterior circulation: Focal non stenotic plaque noted within the dominant left V4 segment. Left vertebral artery otherwise widely patent to the vertebrobasilar junction. Right vertebral artery diminutive but widely patent as well. Posterior inferior cerebral arteries patent bilaterally. Basilar tortuous but patent to its distal aspect without high-grade stenosis. Superior cerebral arteries patent bilaterally. Right PCA supplied via the basilar. Fetal type origin of the left PCA. Single short-segment moderate left P2 stenosis noted. PCAs otherwise widely patent to their distal aspects. Venous sinuses: Not well assessed due to arterial timing of the contrast bolus, but grossly patent. Anatomic variants: Fetal type left PCA.  No intracranial aneurysm. Delayed phase: Not performed. Review of the MIP images confirms the above findings CT Brain Perfusion Findings: CBF (<30%) Volume: 36mL Perfusion (Tmax>6.0s) volume: 58mL Mismatch Volume: 63mL  Infarction Location:Negative. IMPRESSION: 1. Negative CTA with no large vessel occlusion identified. 2. Negative CT perfusion with no evidence for core infarct or perfusion abnormality. 3. Atheromatous stenoses about the carotid bifurcations bilaterally, measuring up to 70% on the right and 75-80% on the left. 4. Additional mild scattered atherosclerotic change elsewhere within the major arterial vasculature of the head and neck as above. No other high-grade or correctable stenosis. 5. Thyroid goiter. Electronically Signed   By: Jeannine Boga M.D.   On: 08/25/2018 20:03   Dg Chest Port 1 View  Result Date: 08/25/2018 CLINICAL DATA:  History of CHF.  Altered mental status. EXAM: PORTABLE CHEST 1 VIEW COMPARISON:  11/02/2017.  03/10/2015.  CT 05/19/2014. FINDINGS: Stable prominence of the superior mediastinum consistent with substernal goiter as previously noted on CT of 05/19/2014. Heart size stable. Low lung volumes mild basilar atelectasis. No focal alveolar infiltrate. No pleural effusion or pneumothorax. IMPRESSION: 1.  Substernal goiter. 2. Low lung volumes with mild basilar atelectasis. Exam otherwise unremarkable. Electronically Signed   By: Marcello Moores  Register   On: 08/25/2018 15:04    Time Spent in minutes  25   Lief Palmatier M.D on 08/26/2018 at 11:29 AM  Between 7am to 7pm - Pager - [phone]  After 7pm go to [email or web address] - password [facility]  [facility] -  Office  [phone]

## 2018-08-26 NOTE — Procedures (Signed)
History: 84 year old female being evaluated for seizures  Sedation: None  Technique: This is a 21 channel routine scalp EEG performed at the bedside with bipolar and monopolar montages arranged in accordance to the international 10/20 system of electrode placement. One channel was dedicated to EKG recording.    Background: The background consists of intermixed alpha and beta activities. There is a well defined posterior dominant rhythm of 8-9 hz that attenuates with eye opening. Sleep is recorded with normal appearing structures.   Photic stimulation: Physiologic driving is not performed  EEG Abnormalities: None  Clinical Interpretation: This normal EEG is recorded in the waking and sleep state. There was no seizure or seizure predisposition recorded on this study. Please note that a normal EEG does not preclude the possibility of epilepsy.   Roland Rack, MD [facility] [phone]  If 7pm- 7am, please page neurology on call as listed in [place].

## 2018-08-27 DIAGNOSIS — F0390 Unspecified dementia without behavioral disturbance: Secondary | ICD-10-CM | POA: Diagnosis present

## 2018-08-27 DIAGNOSIS — R569 Unspecified convulsions: Secondary | ICD-10-CM | POA: Diagnosis present

## 2018-08-27 DIAGNOSIS — K219 Gastro-esophageal reflux disease without esophagitis: Secondary | ICD-10-CM | POA: Diagnosis present

## 2018-08-27 DIAGNOSIS — F419 Anxiety disorder, unspecified: Secondary | ICD-10-CM | POA: Diagnosis present

## 2018-08-27 DIAGNOSIS — R4182 Altered mental status, unspecified: Secondary | ICD-10-CM | POA: Diagnosis present

## 2018-08-27 DIAGNOSIS — Z6830 Body mass index (BMI) 30.0-30.9, adult: Secondary | ICD-10-CM | POA: Diagnosis not present

## 2018-08-27 DIAGNOSIS — R4701 Aphasia: Secondary | ICD-10-CM | POA: Diagnosis present

## 2018-08-27 DIAGNOSIS — E785 Hyperlipidemia, unspecified: Secondary | ICD-10-CM | POA: Diagnosis present

## 2018-08-27 DIAGNOSIS — E119 Type 2 diabetes mellitus without complications: Secondary | ICD-10-CM | POA: Diagnosis present

## 2018-08-27 DIAGNOSIS — I110 Hypertensive heart disease with heart failure: Secondary | ICD-10-CM | POA: Diagnosis present

## 2018-08-27 DIAGNOSIS — G4733 Obstructive sleep apnea (adult) (pediatric): Secondary | ICD-10-CM | POA: Diagnosis present

## 2018-08-27 DIAGNOSIS — G4736 Sleep related hypoventilation in conditions classified elsewhere: Secondary | ICD-10-CM | POA: Diagnosis present

## 2018-08-27 DIAGNOSIS — I350 Nonrheumatic aortic (valve) stenosis: Secondary | ICD-10-CM | POA: Diagnosis present

## 2018-08-27 DIAGNOSIS — Z96651 Presence of right artificial knee joint: Secondary | ICD-10-CM | POA: Diagnosis present

## 2018-08-27 DIAGNOSIS — I10 Essential (primary) hypertension: Secondary | ICD-10-CM | POA: Diagnosis not present

## 2018-08-27 DIAGNOSIS — E669 Obesity, unspecified: Secondary | ICD-10-CM | POA: Diagnosis present

## 2018-08-27 DIAGNOSIS — R29810 Facial weakness: Secondary | ICD-10-CM | POA: Diagnosis present

## 2018-08-27 DIAGNOSIS — G9341 Metabolic encephalopathy: Secondary | ICD-10-CM | POA: Diagnosis present

## 2018-08-27 DIAGNOSIS — F329 Major depressive disorder, single episode, unspecified: Secondary | ICD-10-CM | POA: Diagnosis present

## 2018-08-27 DIAGNOSIS — I5032 Chronic diastolic (congestive) heart failure: Secondary | ICD-10-CM | POA: Diagnosis present

## 2018-08-27 DIAGNOSIS — E876 Hypokalemia: Secondary | ICD-10-CM | POA: Diagnosis present

## 2018-08-27 DIAGNOSIS — E063 Autoimmune thyroiditis: Secondary | ICD-10-CM | POA: Diagnosis present

## 2018-08-27 DIAGNOSIS — H53461 Homonymous bilateral field defects, right side: Secondary | ICD-10-CM | POA: Diagnosis present

## 2018-08-27 DIAGNOSIS — F13239 Sedative, hypnotic or anxiolytic dependence with withdrawal, unspecified: Secondary | ICD-10-CM | POA: Diagnosis present

## 2018-08-27 DIAGNOSIS — N390 Urinary tract infection, site not specified: Secondary | ICD-10-CM | POA: Diagnosis present

## 2018-08-27 LAB — CBC
HCT: 39.9 % (ref 36.0–46.0)
Hemoglobin: 12.6 g/dL (ref 12.0–15.0)
MCH: 30.2 pg (ref 26.0–34.0)
MCHC: 31.6 g/dL (ref 30.0–36.0)
MCV: 95.7 fL (ref 78.0–100.0)
Platelets: 144 10*3/uL — ABNORMAL LOW (ref 150–400)
RBC: 4.17 MIL/uL (ref 3.87–5.11)
RDW: 12.7 % (ref 11.5–15.5)
WBC: 8.4 10*3/uL (ref 4.0–10.5)

## 2018-08-27 LAB — URINE CULTURE

## 2018-08-27 LAB — GLUCOSE, CAPILLARY: Glucose-Capillary: 122 mg/dL — ABNORMAL HIGH (ref 70–99)

## 2018-08-27 LAB — HEMOGLOBIN A1C
Hgb A1c MFr Bld: 5 % (ref 4.8–5.6)
Mean Plasma Glucose: 97 mg/dL

## 2018-08-27 MED ORDER — ACETAMINOPHEN 325 MG PO TABS: 650.0000 mg | ORAL_TABLET | Freq: Four times a day (QID) | ORAL | Status: DC | PRN

## 2018-08-27 MED ORDER — CLONAZEPAM 0.5 MG PO TABS
0.5000 mg | ORAL_TABLET | Freq: Two times a day (BID) | ORAL | Status: DC | PRN
  Administered 2018-08-27 – 2018-08-28 (×3): 0.5 mg via ORAL
  Filled 2018-08-27 (×3): qty 1

## 2018-08-27 NOTE — Progress Notes (Addendum)
Subjective: Patient expresses that she has high anxiety.  She feels as though she is not getting her medications on time.    Exam: Vitals:   08/27/18 0402 08/27/18 0835  BP: (!) 146/88 100/81  Pulse: 80 81  Resp: 19 (!) 22  Temp: 98.5 F (36.9 C) 97.9 F (36.6 C)  SpO2: 98% 95%    Physical Exam   HEENT-  Normocephalic, no lesions, without obvious abnormality.  Normal external eye and conjunctiva.   Extremities- Warm, dry and intact Musculoskeletal-no joint tenderness, deformity or swelling Skin-warm and dry, no hyperpigmentation, vitiligo, or suspicious lesions    Neuro:  Mental Status: Alert, oriented, thought content appropriate.  Speech fluent without evidence of aphasia.  Able to follow 3 step commands without difficulty. Cranial Nerves: II:  Visual fields grossly normal,  III,IV, VI: ptosis not present, extra-ocular motions intact bilaterally pupils equal, round, reactive to light and accommodation V,VII: smile symmetric, facial light touch sensation normal bilaterally VIII: hearing normal bilaterally XI: bilateral shoulder shrug XII: midline tongue extension Motor: She is moving bilateral upper extremities with good 4/5 strength, however when asked to lift her legs off the bed she only lifts them approximately 1 inch. Sensory: Pinprick and light touch intact throughout, bilaterally Deep Tendon Reflexes: 2+ and symmetric throughout Plantars: Right: downgoing   Left: downgoing Cerebellar: normal finger-to-nose,      Medications:  Prior to Admission:  Medications Prior to Admission  Medication Sig Dispense Refill Last Dose  . carvedilol (COREG) 6.25 MG tablet TAKE 1 TABLET BY MOUTH TWICE A DAY WITH MEALS 180 tablet 1 08/24/2018 at Unknown time  . chlorthalidone (HYGROTON) 25 MG tablet TAKE 1 TABLET BY MOUTH ONCE DAILY 90 tablet 0 08/24/2018 at Unknown time  . gabapentin (NEURONTIN) 100 MG capsule Take 1 capsule (100 mg total) by mouth 3 (three) times daily. 90  capsule 5 08/24/2018 at Unknown time  . MYRBETRIQ 25 MG TB24 tablet TAKE 1 TABLET BY MOUTH ONCE DAILY 90 tablet 1 08/24/2018 at Unknown time  . omeprazole (PRILOSEC) 40 MG capsule Take 1 capsule (40 mg total) by mouth daily. 90 capsule 1 08/24/2018 at Unknown time  . oxyCODONE-acetaminophen (PERCOCET) 10-325 MG tablet Take 1 tablet by mouth every 8 (eight) hours as needed for pain. 90 tablet 0 unk at prn  . promethazine (PHENERGAN) 12.5 MG tablet Take 1 tablet (12.5 mg total) by mouth every 6 (six) hours as needed for nausea or vomiting. 30 tablet 3 unk at prn  . traZODone (DESYREL) 100 MG tablet TAKE 2 TABLETS BY MOUTH AT BEDTIME 180 tablet 0 08/24/2018 at Unknown time  . calcium carbonate (OS-CAL) 600 MG TABS tablet Take 600 mg by mouth 2 (two) times daily with a meal.   unk  . Cholecalciferol 2000 units TABS Take 2 tablets (4,000 Units total) by mouth daily. 180 tablet 1 unk  . conjugated estrogens (PREMARIN) vaginal cream Place 1 Applicatorful vaginally daily. (Patient not taking: Reported on 08/25/2018) 42.5 g 1 Completed Course at Unknown time  . hydrALAZINE (APRESOLINE) 50 MG tablet Take 1 tablet (50 mg total) 3 (three) times daily by mouth. (Patient not taking: Reported on 08/25/2018) 270 tablet 1 Not Taking at Unknown time  . potassium chloride (K-DUR) 10 MEQ tablet Take 1 tablet (10 mEq total) by mouth 3 (three) times daily. (Patient not taking: Reported on 08/25/2018) 270 tablet 0 Not Taking at Unknown time   Scheduled: . carvedilol  6.25 mg Oral BID WC  . chlorthalidone  25 mg  Oral Daily  . enoxaparin (LOVENOX) injection  40 mg Subcutaneous Q24H  . gabapentin  300 mg Oral TID  . LORazepam  0.25 mg Intravenous BID  . mirabegron ER  25 mg Oral Daily   Continuous: . sodium chloride 100 mL/hr at 08/26/18 0950  . cefTRIAXone (ROCEPHIN)  IV 1 g (08/27/18 0629)  . famotidine (PEPCID) IV 20 mg (08/26/18 2323)    Pertinent Labs/Diagnostics: EEG: EEG Abnormalities: None  Clinical  Interpretation: This normal EEG is recorded in the waking and sleep state. There was no seizure or seizure predisposition recorded on this study. 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  White blood cell count 8.4 which has improved from 2 days ago which was 15.1 LDL 106 A1c 5.0 Urine culture negative   Etta Quill PA-C [facility] [phone]  I have seen the patient and reviewed the above note.  Assessment: 84 year old female with episode concerning for seizure and postictal aphasia and hemianopsia.  Given history of seizure at this time favored  antiepileptic therapy.  Patient has had no further seizures.  MRI revealed no acute intracranial abnormalities.  At this time having no significant side effects from gabapentin.  No other factors including UTI, changes in her medications could have contributed to the timing of the seizure, the focal nature makes me think that it is not solely due to withdrawal and therefore would favor continued AED therapy.   Recommendations: -Continue gabapentin 300 mg 3 times daily.  Again if she becomes dizzy or overly sedated could decrease it to 200 mg 3 times daily for a week and then increase to 300 mg 3 times daily. -Patient is unable to drive, operate heavy machinery, perform activities at heights or participate in water activities until release by outpatient physician. This was discussed with the patient who expressed understanding.   Roland Rack, MD [facility] [phone]  If 7pm- 7am, please page neurology on call as listed in [place].  08/27/2018, 9:02 AM

## 2018-08-27 NOTE — Progress Notes (Signed)
Nutrition Brief Note  Patient identified on the Malnutrition Screening Tool (MST) Report  Wt Readings from Last 15 Encounters:  08/25/18 90.7 kg  07/07/18 85.6 kg  07/02/18 86.8 kg  05/15/18 89.8 kg  03/30/18 87.8 kg  01/19/18 93.4 kg  12/17/17 93.4 kg  10/27/17 93.4 kg  08/05/17 90.7 kg  06/12/17 95 kg  05/19/17 92.2 kg  04/24/17 89.4 kg  02/10/17 88.1 kg  02/03/17 87.1 kg  02/03/17 87.4 kg   [name] is a 84 y.o. female with medical history significant of hypertension, hyperlipidemia, diet-controlled diabetes, dCHF, GERD, anxiety, hepatitis, aortic stenosis, OSA on CPAP, obesity, dementia, remote seizure not on anti-seizure medications, who presents with altered mental status.  Pt admitted with acute metabolic encephalopathy.   Spoke with pt at bedside, who was sitting in recliner chair at time of visit. She reports poor appetite PTA, however, did not elaborate on further details despite RD probing. She continually said "I have food at home that I'm gonna throw away". She reports that her swallow function has improved and ate most of her breakfast. Observed breakfast tray and pt consumed all except about 25% of biscuit.   Pt unsure if she has lost wt. Reviewed wt hx, which reveals wt stability over the past year.  Pt reports being hungry- RD assisted pt with lunch tray et up. RD did not observe any distress with pt consuming meal.   Nutrition-Focused physical exam completed. Findings are no fat depletion, mild muscle depletion, and no edema.   Body mass index is 30.41 kg/m. Patient meets criteria for obesity, class I based on current BMI.   Current diet order is regular, patient is consuming approximately 75% of meals at this time. Labs and medications reviewed.   No nutrition interventions warranted at this time. If nutrition issues arise, please consult RD.   Ndrew Creason A. Jimmye Norman, RD, LDN, CDE Pager: [phone] After hours Pager: [phone]

## 2018-08-27 NOTE — Progress Notes (Signed)
PROGRESS NOTE                                                                                                                                                                                                             Patient Demographics:    [name], is a 84 y.o. female, DOB - 1933-05-16, VPX:[id number]  Admit date - 08/25/2018   Admitting Physician Ivor Costa, MD  Outpatient Primary MD for the patient is [name], MD  LOS - 0  Outpatient Specialists: None  Chief Complaint  Patient presents with  . Altered Mental Status       Brief Narrative 84 year old female with history of hypertension, diet-controlled diabetes mellitus, GERD, anxiety, aortic stenosis, OSA on CPAP, obesity, remote seizures not on medications presented from home with altered mental status.  As per her daughter she was found to be confused since 2 days prior to admission.  At baseline she is well oriented.  So I suspect she did not have any facial droop, visual symptoms, fall or weakness.  On the afternoon of admission she had mild body twitching and then could not speak.  She was undergoing a bowel prep for colonoscopy that was scheduled on the day of admission.  Patient reports that she is having off-and-on dysuria.  Patient was sent from the GI clinic to the ED.  In the ED she had normal vitals except for mild tachycardia, will receive 15 point 1K, ammonia of 22, UA with trace leukocytes and WBC, potassium of 3.4.  Neurology consulted and CT of the head and neck was done which was negative for large vessel occlusion.  She then had a CTA of brain perfusion which again was negative for large vessel occlusion or acute intracranial abnormalities.  CT of the abdomen and pelvis was done which was negative for acute findings. Given concern for seizures patient placed on observation and EEG ordered.     Subjective:   Patient reports feeling anxious.   Denies any weakness or numbness.   Assessment  & Plan :    Principal Problem:    Acute metabolic encephalopathy -Based on history suspect complex partial seizures -Neurology following, EEG was negative, gabapentin dose increased -I suspect the etiology of her medications was likely benzodiazepine withdrawal. -Patient reports long history of taking Ativan and Klonopin although home medication list  only has Klonopin, this may have been temporarily since held for her colonoscopy which triggered the above event, will attempt to contact daughter again to clarify home medicines -CT Angio of her head and brain perfusion scan were unremarkable -MRI brain noted old infarcts only -Mentation improving, not back to baseline yet -Resume low-dose Klonopin twice daily -Physical therapy evaluation  Possible urinary tract infection -on empiric IV ceftriaxone day 2 -History of mild dysuria, follow-up urine cultures    Essential hypertension  -Fluctuation in systolic blood pressure from 90s to 150 -Resume home regimen once BP consistently elevated    OSA (obstructive sleep apnea)   Sleep-related hypoventilation Continue nighttime CPAP.    Hypokalemia Replenished.  Chronic anxiety -Resume low-dose scheduled Klonopin    Chronic diastolic CHF (congestive heart failure) (HCC) Euvolemic.  Continue home meds    Hypercalcemia   Anxiety  DT prophylaxis: Lovenox  Full code Family communication: No family at bedside, attempted to contact her daughter via telephone, unsuccessful Disposition: Pending stability following medication changes, PT eval pending as well   Consults  : Neurology  Procedures  : CT head, CT angiogram head and neck, CT brain perfusion scan, EEG  Lab Results  Component Value Date   PLT 144 (L) 08/27/2018    Antibiotics  :    Anti-infectives (From admission, onward)   Start     Dose/Rate Route Frequency Ordered Stop   08/26/18 0600  cefTRIAXone (ROCEPHIN) 1 g in  sodium chloride 0.9 % 100 mL IVPB     1 g 200 mL/hr over 30 Minutes Intravenous Every 24 hours 08/25/18 2211     08/25/18 1830  piperacillin-tazobactam (ZOSYN) IVPB 3.375 g     3.375 g 100 mL/hr over 30 Minutes Intravenous  Once 08/25/18 1815 08/25/18 1955        Objective:   Vitals:   08/26/18 1928 08/26/18 2300 08/27/18 0402 08/27/18 0835  BP: (!) 168/71 (!) 160/87 (!) 146/88 100/81  Pulse: 81 95 80 81  Resp: 20 20 19  (!) 22  Temp: 99.2 F (37.3 C) 98.9 F (37.2 C) 98.5 F (36.9 C) 97.9 F (36.6 C)  TempSrc: Oral Axillary Oral Oral  SpO2: 95% 96% 98% 95%  Weight:      Height:        Wt Readings from Last 3 Encounters:  08/25/18 90.7 kg  07/07/18 85.6 kg  07/02/18 86.8 kg     Intake/Output Summary (Last 24 hours) at 08/27/2018 1142 Last data filed at 08/27/2018 0841 Gross per 24 hour  Intake 660 ml  Output 101 ml  Net 559 ml     Physical Exam  Gen: Anxious female, laying in bed, alert awake oriented x3 HEENT: PERRLA, Neck supple, no JVD Lungs: Good air movement bilaterally, CTAB CVS: S1-S2/regular rate rhythm, systolic murmur noted  abd: soft, Non tender, non distended, BS present Extremities: No edema Skin: no new rashes Neuro: Moves all extremities, no localizing signs    Data Review:    CBC Recent Labs  Lab 08/25/18 1453 08/25/18 1555 08/27/18 0536  WBC  --  15.1* 8.4  HGB 17.0* 15.8* 12.6  HCT 50.0* 48.8* 39.9  PLT  --  210 144*  MCV  --  93.8 95.7  MCH  --  30.4 30.2  MCHC  --  32.4 31.6  RDW  --  12.7 12.7  LYMPHSABS  --  1.8  --   MONOABS  --  1.1*  --   EOSABS  --  0.0  --  BASOSABS  --  0.1  --     Chemistries  Recent Labs  Lab 08/25/18 1453 08/25/18 1555  NA 141 141  K 3.1* 3.4*  CL 105 102  CO2  --  27  GLUCOSE 163* 133*  BUN 10 9  CREATININE 0.70 0.88  CALCIUM  --  10.4*  AST  --  34  ALT  --  16  ALKPHOS  --  49  BILITOT  --  2.6*    ------------------------------------------------------------------------------------------------------------------ Recent Labs    08/26/18 0116  CHOL 194  HDL 69  LDLCALC 106*  TRIG 95  CHOLHDL 2.8    Lab Results  Component Value Date   HGBA1C 5.0 08/26/2018   ------------------------------------------------------------------------------------------------------------------ Recent Labs    08/25/18 1803  TSH 2.350   ------------------------------------------------------------------------------------------------------------------ No results for input(s): VITAMINB12, FOLATE, FERRITIN, TIBC, IRON, RETICCTPCT in the last 72 hours.  Coagulation profile Recent Labs  Lab 08/25/18 1555  INR 1.03    No results for input(s): DDIMER in the last 72 hours.  Cardiac Enzymes No results for input(s): CKMB, TROPONINI, MYOGLOBIN in the last 168 hours.  Invalid input(s): CK ------------------------------------------------------------------------------------------------------------------    Component Value Date/Time   BNP 292.5 (H) 08/26/2018 0116    Inpatient Medications  Scheduled Meds: . carvedilol  6.25 mg Oral BID WC  . chlorthalidone  25 mg Oral Daily  . enoxaparin (LOVENOX) injection  40 mg Subcutaneous Q24H  . gabapentin  300 mg Oral TID  . mirabegron ER  25 mg Oral Daily   Continuous Infusions: . sodium chloride 100 mL/hr at 08/26/18 0950  . cefTRIAXone (ROCEPHIN)  IV 1 g (08/27/18 0629)  . famotidine (PEPCID) IV 20 mg (08/27/18 0921)   PRN Meds:.acetaminophen, acetaminophen, clonazePAM, hydrALAZINE, ondansetron (ZOFRAN) IV  Micro Results Recent Results (from the past 240 hour(s))  Urine culture     Status: Abnormal   Collection Time: 08/25/18  6:38 PM  Result Value Ref Range Status   Specimen Description URINE, CATHETERIZED  Final   Special Requests NONE  Final   Culture (A)  Final    <10,000 COLONIES/mL INSIGNIFICANT GROWTH Performed at [facility], [address], [place], [place] [address]    Report Status 08/27/2018 FINAL  Final  Culture, blood (Routine X 2) w Reflex to ID Panel     Status: None (Preliminary result)   Collection Time: 08/26/18  1:06 AM  Result Value Ref Range Status   Specimen Description BLOOD LEFT HAND  Final   Special Requests   Final    BOTTLES DRAWN AEROBIC ONLY Blood Culture adequate volume   Culture   Final    NO GROWTH 1 DAY Performed at [facility], [place] [address], [place], [place] [address]    Report Status PENDING  Incomplete  Culture, blood (Routine X 2) w Reflex to ID Panel     Status: None (Preliminary result)   Collection Time: 08/26/18  1:15 AM  Result Value Ref Range Status   Specimen Description BLOOD RIGHT HAND  Final   Special Requests   Final    BOTTLES DRAWN AEROBIC ONLY Blood Culture adequate volume   Culture   Final    NO GROWTH 1 DAY Performed at [facility], [place] [address], [place], [place] [address]    Report Status PENDING  Incomplete    Radiology Reports Ct Angio Head W Or Wo Contrast  Result Date: 08/25/2018 CLINICAL DATA:  Initial evaluation for acute altered mental status.  EXAM: CT ANGIOGRAPHY HEAD AND NECK CT PERFUSION BRAIN TECHNIQUE: Multidetector CT imaging of the head and neck was performed using the standard protocol during bolus administration of intravenous contrast. Multiplanar CT image reconstructions and MIPs were obtained to evaluate the vascular anatomy. Carotid stenosis measurements (when applicable) are obtained utilizing NASCET criteria, using the distal internal carotid diameter as the denominator. Multiphase CT imaging of the brain was performed following IV bolus contrast injection. Subsequent parametric perfusion maps were calculated using RAPID software. CONTRAST:  177mL ISOVUE-370 IOPAMIDOL (ISOVUE-370) INJECTION 76% COMPARISON:  Prior CT from earlier the same day. FINDINGS: CT HEAD FINDINGS Brain: Atrophy with chronic microvascular ischemic  disease again noted, stable. Small remote right thalamic and right cerebellar infarcts noted. No evidence for acute or interval intracranial hemorrhage. No acute large vessel territory infarct. No mass lesion, midline shift or mass effect. No hydrocephalus. No extra-axial fluid collection. Vascular: No hyperdense vessel. Scattered vascular calcifications noted within the carotid siphons. Skull: Scalp soft tissues and calvarium within normal limits. Sinuses/Orbits: Right maxillary sinus retention cyst. Acute on chronic right sphenoid sinusitis. Mastoid air cells and middle ear cavities are clear. Globes and orbital soft tissues within normal limits. Other: None. CTA NECK FINDINGS Aortic arch: Visualized aortic arch of normal caliber with normal branch pattern. Incidental note made of a bovine arch with common origin of the left common and brachiocephalic arteries. Mild atherosclerotic change about the arch and origin of the great vessels without hemodynamically significant stenosis. Visualized subclavian arteries widely patent. Right carotid system: Right common carotid artery patent from its origin to the bifurcation without stenosis. A centric calcified plaque about the proximal right ICA with associated stenosis of up to 70% by NASCET criteria. Right ICA tortuous but otherwise patent to the skull base without stenosis, dissection, or occlusion Left carotid system: Left common carotid artery tortuous proximally but widely patent to the bifurcation. Scattered calcified plaque about the proximal left ICA with associated stenosis of up to 75-80% by NASCET criteria. Left ICA tortuous but otherwise patent to the skull base. Both internal carotid arteries medialized into the retropharyngeal space. Vertebral arteries: Both of the vertebral arteries arise from the subclavian arteries. Left vertebral artery dominant. Mild plaque at the origin of the vertebral arteries bilaterally without significant stenosis. Vertebral  arteries widely patent within the neck without stenosis, dissection, or occlusion. Skeleton: No acute osseous abnormality. No discrete osseous lesions. Prominent osteoarthritic changes about the C1-2 articulation. Advanced right-sided facet arthrosis at C2-3. Other neck: Thyroid diffuse lead enlarged and heterogeneous in appearance, likely related to goiter. Soft tissues of the neck demonstrate no acute finding. Upper chest: Visualized upper chest and mediastinum within normal limits. Small focus of tree-in-bud nodular opacity at the posterior left upper lobe (series 11, image 159), consistent with mild small airways disease. Review of the MIP images confirms the above findings CTA HEAD FINDINGS Anterior circulation: Petrous segments widely patent bilaterally. Calcified plaque within the cavernous/supraclinoid ICAs with resultant mild to moderate multifocal narrowing, right slightly worse than left. ICA termini widely patent. A1 segments patent bilaterally. Normal anterior communicating artery. Anterior cerebral arteries patent to their distal aspects without stenosis. No M1 stenosis or occlusion. No proximal M2 occlusion. Distal MCA branches well perfused and symmetric. Posterior circulation: Focal non stenotic plaque noted within the dominant left V4 segment. Left vertebral artery otherwise widely patent to the vertebrobasilar junction. Right vertebral artery diminutive but widely patent as well. Posterior inferior cerebral arteries patent bilaterally. Basilar tortuous but patent to its distal aspect  without high-grade stenosis. Superior cerebral arteries patent bilaterally. Right PCA supplied via the basilar. Fetal type origin of the left PCA. Single short-segment moderate left P2 stenosis noted. PCAs otherwise widely patent to their distal aspects. Venous sinuses: Not well assessed due to arterial timing of the contrast bolus, but grossly patent. Anatomic variants: Fetal type left PCA.  No intracranial aneurysm.  Delayed phase: Not performed. Review of the MIP images confirms the above findings CT Brain Perfusion Findings: CBF (<30%) Volume: 46mL Perfusion (Tmax>6.0s) volume: 77mL Mismatch Volume: 70mL Infarction Location:Negative. IMPRESSION: 1. Negative CTA with no large vessel occlusion identified. 2. Negative CT perfusion with no evidence for core infarct or perfusion abnormality. 3. Atheromatous stenoses about the carotid bifurcations bilaterally, measuring up to 70% on the right and 75-80% on the left. 4. Additional mild scattered atherosclerotic change elsewhere within the major arterial vasculature of the head and neck as above. No other high-grade or correctable stenosis. 5. Thyroid goiter. Electronically Signed   By: Jeannine Boga M.D.   On: 08/25/2018 20:03   Ct Head Wo Contrast  Result Date: 08/25/2018 CLINICAL DATA:  Altered mental status 2 days. EXAM: CT HEAD WITHOUT CONTRAST TECHNIQUE: Contiguous axial images were obtained from the base of the skull through the vertex without intravenous contrast. COMPARISON:  08/05/2017 and 10/26/2013 FINDINGS: Brain: Ventricles, cisterns and other CSF spaces are within normal. There is mild chronic ischemic microvascular disease. Tiny old right thalamic lacunar infarct. No mass, mass effect, shift of midline structures or acute hemorrhage. No evidence of acute infarction. Vascular: No hyperdense vessel or unexpected calcification. Skull: Well-defined sclerotic focus over the left frontal skull unchanged. Sinuses/Orbits: No acute finding. Other: None. IMPRESSION: No acute findings. Mild chronic ischemic microvascular disease. Small old thalamic lacunar infarct. Electronically Signed   By: Marin Olp M.D.   On: 08/25/2018 16:09   Ct Angio Neck W Or Wo Contrast  Result Date: 08/25/2018 CLINICAL DATA:  Initial evaluation for acute altered mental status. EXAM: CT ANGIOGRAPHY HEAD AND NECK CT PERFUSION BRAIN TECHNIQUE: Multidetector CT imaging of the head and neck  was performed using the standard protocol during bolus administration of intravenous contrast. Multiplanar CT image reconstructions and MIPs were obtained to evaluate the vascular anatomy. Carotid stenosis measurements (when applicable) are obtained utilizing NASCET criteria, using the distal internal carotid diameter as the denominator. Multiphase CT imaging of the brain was performed following IV bolus contrast injection. Subsequent parametric perfusion maps were calculated using RAPID software. CONTRAST:  1100mL ISOVUE-370 IOPAMIDOL (ISOVUE-370) INJECTION 76% COMPARISON:  Prior CT from earlier the same day. FINDINGS: CT HEAD FINDINGS Brain: Atrophy with chronic microvascular ischemic disease again noted, stable. Small remote right thalamic and right cerebellar infarcts noted. No evidence for acute or interval intracranial hemorrhage. No acute large vessel territory infarct. No mass lesion, midline shift or mass effect. No hydrocephalus. No extra-axial fluid collection. Vascular: No hyperdense vessel. Scattered vascular calcifications noted within the carotid siphons. Skull: Scalp soft tissues and calvarium within normal limits. Sinuses/Orbits: Right maxillary sinus retention cyst. Acute on chronic right sphenoid sinusitis. Mastoid air cells and middle ear cavities are clear. Globes and orbital soft tissues within normal limits. Other: None. CTA NECK FINDINGS Aortic arch: Visualized aortic arch of normal caliber with normal branch pattern. Incidental note made of a bovine arch with common origin of the left common and brachiocephalic arteries. Mild atherosclerotic change about the arch and origin of the great vessels without hemodynamically significant stenosis. Visualized subclavian arteries widely patent. Right carotid system: Right  common carotid artery patent from its origin to the bifurcation without stenosis. A centric calcified plaque about the proximal right ICA with associated stenosis of up to 70% by  NASCET criteria. Right ICA tortuous but otherwise patent to the skull base without stenosis, dissection, or occlusion Left carotid system: Left common carotid artery tortuous proximally but widely patent to the bifurcation. Scattered calcified plaque about the proximal left ICA with associated stenosis of up to 75-80% by NASCET criteria. Left ICA tortuous but otherwise patent to the skull base. Both internal carotid arteries medialized into the retropharyngeal space. Vertebral arteries: Both of the vertebral arteries arise from the subclavian arteries. Left vertebral artery dominant. Mild plaque at the origin of the vertebral arteries bilaterally without significant stenosis. Vertebral arteries widely patent within the neck without stenosis, dissection, or occlusion. Skeleton: No acute osseous abnormality. No discrete osseous lesions. Prominent osteoarthritic changes about the C1-2 articulation. Advanced right-sided facet arthrosis at C2-3. Other neck: Thyroid diffuse lead enlarged and heterogeneous in appearance, likely related to goiter. Soft tissues of the neck demonstrate no acute finding. Upper chest: Visualized upper chest and mediastinum within normal limits. Small focus of tree-in-bud nodular opacity at the posterior left upper lobe (series 11, image 159), consistent with mild small airways disease. Review of the MIP images confirms the above findings CTA HEAD FINDINGS Anterior circulation: Petrous segments widely patent bilaterally. Calcified plaque within the cavernous/supraclinoid ICAs with resultant mild to moderate multifocal narrowing, right slightly worse than left. ICA termini widely patent. A1 segments patent bilaterally. Normal anterior communicating artery. Anterior cerebral arteries patent to their distal aspects without stenosis. No M1 stenosis or occlusion. No proximal M2 occlusion. Distal MCA branches well perfused and symmetric. Posterior circulation: Focal non stenotic plaque noted within  the dominant left V4 segment. Left vertebral artery otherwise widely patent to the vertebrobasilar junction. Right vertebral artery diminutive but widely patent as well. Posterior inferior cerebral arteries patent bilaterally. Basilar tortuous but patent to its distal aspect without high-grade stenosis. Superior cerebral arteries patent bilaterally. Right PCA supplied via the basilar. Fetal type origin of the left PCA. Single short-segment moderate left P2 stenosis noted. PCAs otherwise widely patent to their distal aspects. Venous sinuses: Not well assessed due to arterial timing of the contrast bolus, but grossly patent. Anatomic variants: Fetal type left PCA.  No intracranial aneurysm. Delayed phase: Not performed. Review of the MIP images confirms the above findings CT Brain Perfusion Findings: CBF (<30%) Volume: 31mL Perfusion (Tmax>6.0s) volume: 22mL Mismatch Volume: 45mL Infarction Location:Negative. IMPRESSION: 1. Negative CTA with no large vessel occlusion identified. 2. Negative CT perfusion with no evidence for core infarct or perfusion abnormality. 3. Atheromatous stenoses about the carotid bifurcations bilaterally, measuring up to 70% on the right and 75-80% on the left. 4. Additional mild scattered atherosclerotic change elsewhere within the major arterial vasculature of the head and neck as above. No other high-grade or correctable stenosis. 5. Thyroid goiter. Electronically Signed   By: Jeannine Boga M.D.   On: 08/25/2018 20:03   Mr Brain Wo Contrast  Result Date: 08/26/2018 CLINICAL DATA:  Altered mental status. New onset seizures. History of hypertension, hyperlipidemia, diabetes. EXAM: MRI HEAD WITHOUT CONTRAST TECHNIQUE: Multiplanar, multiecho pulse sequences of the brain and surrounding structures were obtained without intravenous contrast. COMPARISON:  CT HEAD August 25, 2018 FINDINGS: Moderately motion degraded examination. INTRACRANIAL CONTENTS: No reduced diffusion to suggest acute  ischemia. No susceptibility artifact to suggest hemorrhage. The ventricles and sulci are normal for patient's age. Patchy supratentorial  white matter FLAIR T2 hyperintensities. Old small bilateral cerebellar infarcts. Old RIGHT thalamus lacunar infarct. No suspicious parenchymal signal, masses, mass effect. No abnormal extra-axial fluid collections. No extra-axial masses. Nondiagnostic thin slice coronal imaging of the hippocampi. VASCULAR: Normal major intracranial vascular flow voids present at skull base. SKULL AND UPPER CERVICAL SPINE: Mild empty sella. No suspicious calvarial bone marrow signal. Craniocervical junction maintained. SINUSES/ORBITS: The mastoid air-cells and included paranasal sinuses are well-aerated.The included ocular globes and orbital contents are non-suspicious. Status post bilateral ocular lens implants. OTHER: Patient is edentulous. IMPRESSION: 1. No acute intracranial process on this motion degraded examination. 2. Old small RIGHT thalamus and bilateral cerebellar infarcts. 3. Moderate chronic small vessel ischemic changes. Electronically Signed   By: Elon Alas M.D.   On: 08/26/2018 22:39   Ct Abdomen Pelvis W Contrast  Result Date: 08/25/2018 CLINICAL DATA:  Acute abdominal pain EXAM: CT ABDOMEN AND PELVIS WITH CONTRAST TECHNIQUE: Multidetector CT imaging of the abdomen and pelvis was performed using the standard protocol following bolus administration of intravenous contrast. CONTRAST:  159mL ISOVUE-370 IOPAMIDOL (ISOVUE-370) INJECTION 76% COMPARISON:  CT 07/07/2018, 05/20/2014 FINDINGS: Lower chest: Lung bases demonstrate no acute consolidation or effusion. Heart size upper limits of normal Hepatobiliary: Status post cholecystectomy. No focal hepatic abnormality. Stable slight enlargement of the extrahepatic bile duct. Pancreas: Unremarkable. No pancreatic ductal dilatation or surrounding inflammatory changes. Spleen: Normal in size without focal abnormality.  Adrenals/Urinary Tract: Adrenal glands are stable. 2.7 cm right adrenal mass. Left adrenal gland is normal. No hydronephrosis. Subcentimeter hypodense renal lesions too small to further characterize. Bladder normal Stomach/Bowel: Stomach is nonenlarged. No dilated small bowel. No colon wall thickening. Sigmoid colon diverticula without acute inflammatory process. Vascular/Lymphatic: Nonaneurysmal aorta. Extensive aortic atherosclerosis. No significantly enlarged lymph nodes. Reproductive: Status post hysterectomy. No adnexal masses. Other: Negative for free air or free fluid. Fat within the bilateral inguinal regions. Musculoskeletal: Degenerative changes of the spine. No acute or suspicious abnormality. IMPRESSION: 1. No CT evidence for acute intra-abdominal or pelvic abnormality. 2. Status post cholecystectomy. Slight prominence of the extrahepatic bile duct but without interval increase in dilatation. No intra hepatic biliary dilatation is visualized. 3. Stable 2.7 cm right adrenal gland adenoma 4. Sigmoid colon diverticular disease without acute inflammatory process. Electronically Signed   By: Donavan Foil M.D.   On: 08/25/2018 19:18   Ct Cerebral Perfusion W Contrast  Result Date: 08/25/2018 CLINICAL DATA:  Initial evaluation for acute altered mental status. EXAM: CT ANGIOGRAPHY HEAD AND NECK CT PERFUSION BRAIN TECHNIQUE: Multidetector CT imaging of the head and neck was performed using the standard protocol during bolus administration of intravenous contrast. Multiplanar CT image reconstructions and MIPs were obtained to evaluate the vascular anatomy. Carotid stenosis measurements (when applicable) are obtained utilizing NASCET criteria, using the distal internal carotid diameter as the denominator. Multiphase CT imaging of the brain was performed following IV bolus contrast injection. Subsequent parametric perfusion maps were calculated using RAPID software. CONTRAST:  19mL ISOVUE-370 IOPAMIDOL  (ISOVUE-370) INJECTION 76% COMPARISON:  Prior CT from earlier the same day. FINDINGS: CT HEAD FINDINGS Brain: Atrophy with chronic microvascular ischemic disease again noted, stable. Small remote right thalamic and right cerebellar infarcts noted. No evidence for acute or interval intracranial hemorrhage. No acute large vessel territory infarct. No mass lesion, midline shift or mass effect. No hydrocephalus. No extra-axial fluid collection. Vascular: No hyperdense vessel. Scattered vascular calcifications noted within the carotid siphons. Skull: Scalp soft tissues and calvarium within normal limits. Sinuses/Orbits: Right maxillary sinus  retention cyst. Acute on chronic right sphenoid sinusitis. Mastoid air cells and middle ear cavities are clear. Globes and orbital soft tissues within normal limits. Other: None. CTA NECK FINDINGS Aortic arch: Visualized aortic arch of normal caliber with normal branch pattern. Incidental note made of a bovine arch with common origin of the left common and brachiocephalic arteries. Mild atherosclerotic change about the arch and origin of the great vessels without hemodynamically significant stenosis. Visualized subclavian arteries widely patent. Right carotid system: Right common carotid artery patent from its origin to the bifurcation without stenosis. A centric calcified plaque about the proximal right ICA with associated stenosis of up to 70% by NASCET criteria. Right ICA tortuous but otherwise patent to the skull base without stenosis, dissection, or occlusion Left carotid system: Left common carotid artery tortuous proximally but widely patent to the bifurcation. Scattered calcified plaque about the proximal left ICA with associated stenosis of up to 75-80% by NASCET criteria. Left ICA tortuous but otherwise patent to the skull base. Both internal carotid arteries medialized into the retropharyngeal space. Vertebral arteries: Both of the vertebral arteries arise from the  subclavian arteries. Left vertebral artery dominant. Mild plaque at the origin of the vertebral arteries bilaterally without significant stenosis. Vertebral arteries widely patent within the neck without stenosis, dissection, or occlusion. Skeleton: No acute osseous abnormality. No discrete osseous lesions. Prominent osteoarthritic changes about the C1-2 articulation. Advanced right-sided facet arthrosis at C2-3. Other neck: Thyroid diffuse lead enlarged and heterogeneous in appearance, likely related to goiter. Soft tissues of the neck demonstrate no acute finding. Upper chest: Visualized upper chest and mediastinum within normal limits. Small focus of tree-in-bud nodular opacity at the posterior left upper lobe (series 11, image 159), consistent with mild small airways disease. Review of the MIP images confirms the above findings CTA HEAD FINDINGS Anterior circulation: Petrous segments widely patent bilaterally. Calcified plaque within the cavernous/supraclinoid ICAs with resultant mild to moderate multifocal narrowing, right slightly worse than left. ICA termini widely patent. A1 segments patent bilaterally. Normal anterior communicating artery. Anterior cerebral arteries patent to their distal aspects without stenosis. No M1 stenosis or occlusion. No proximal M2 occlusion. Distal MCA branches well perfused and symmetric. Posterior circulation: Focal non stenotic plaque noted within the dominant left V4 segment. Left vertebral artery otherwise widely patent to the vertebrobasilar junction. Right vertebral artery diminutive but widely patent as well. Posterior inferior cerebral arteries patent bilaterally. Basilar tortuous but patent to its distal aspect without high-grade stenosis. Superior cerebral arteries patent bilaterally. Right PCA supplied via the basilar. Fetal type origin of the left PCA. Single short-segment moderate left P2 stenosis noted. PCAs otherwise widely patent to their distal aspects. Venous  sinuses: Not well assessed due to arterial timing of the contrast bolus, but grossly patent. Anatomic variants: Fetal type left PCA.  No intracranial aneurysm. Delayed phase: Not performed. Review of the MIP images confirms the above findings CT Brain Perfusion Findings: CBF (<30%) Volume: 78mL Perfusion (Tmax>6.0s) volume: 27mL Mismatch Volume: 56mL Infarction Location:Negative. IMPRESSION: 1. Negative CTA with no large vessel occlusion identified. 2. Negative CT perfusion with no evidence for core infarct or perfusion abnormality. 3. Atheromatous stenoses about the carotid bifurcations bilaterally, measuring up to 70% on the right and 75-80% on the left. 4. Additional mild scattered atherosclerotic change elsewhere within the major arterial vasculature of the head and neck as above. No other high-grade or correctable stenosis. 5. Thyroid goiter. Electronically Signed   By: Jeannine Boga M.D.   On: 08/25/2018 20:03  Dg Chest Port 1 View  Result Date: 08/25/2018 CLINICAL DATA:  History of CHF.  Altered mental status. EXAM: PORTABLE CHEST 1 VIEW COMPARISON:  11/02/2017.  03/10/2015.  CT 05/19/2014. FINDINGS: Stable prominence of the superior mediastinum consistent with substernal goiter as previously noted on CT of 05/19/2014. Heart size stable. Low lung volumes mild basilar atelectasis. No focal alveolar infiltrate. No pleural effusion or pneumothorax. IMPRESSION: 1.  Substernal goiter. 2. Low lung volumes with mild basilar atelectasis. Exam otherwise unremarkable. Electronically Signed   By: Marcello Moores  Register   On: 08/25/2018 15:04    Time Spent in minutes: 107min  Domenic Polite M.D on 08/27/2018 at 11:42 AM  Between 7am to 7pm - Page via [email or web address]  After 7pm call floor coverage  [facility] -  Office  [phone]

## 2018-08-27 NOTE — Evaluation (Addendum)
Physical Therapy Evaluation Patient Details Name: [name] MRN: [id number] DOB: 1933/09/13 Today's Date: 08/27/2018   History of Present Illness  Patient is a 84 y/o female who presents with 2 days of mild confusion followed by an episode concerning for seizure with postictal aphasia and hemianopia. Admitted for possible seizures. Head CT/brain MRI-unremarkable. EEG-normal. PMH includes dementia, DM, HTN, Hepatitis, anxiety, depression, CHF.  Clinical Impression  Patient presents with impaired balance, decreased activity tolerance, impaired cognition and impaired safety awareness s/p above. Tolerated transfers, gait training and stair training with min A-Min guard assist for balance/safety. Pt with LOB during ambulation requiring therapist assist to prevent fall. Holding onto IV pole for support. Pt Mod I PTA using SPC and lives alone, drives. Would benefit from using RW. Plans to stay with daughter at d/c. Will follow acutely to maximize independence and mobility prior to return home.    Follow Up Recommendations Home health PT;Supervision for mobility/OOB    Equipment Recommendations  None recommended by PT    Recommendations for Other Services       Precautions / Restrictions Precautions Precautions: Fall Restrictions Weight Bearing Restrictions: No      Mobility  Bed Mobility               General bed mobility comments: up in chair upon PT arrival.   Transfers Overall transfer level: Needs assistance Equipment used: (IV pole) Transfers: Sit to/from Stand Sit to Stand: Min guard         General transfer comment: Min guard for safety. Stood from Youth worker.   Ambulation/Gait Ambulation/Gait assistance: Min assist Gait Distance (Feet): 100 Feet(x2 bouts) Assistive device: (Iv pole) Gait Pattern/deviations: Antalgic;Step-through pattern;Decreased stride length;Drifts right/left Gait velocity: decreased   General Gait Details: Antalgic like gait pattern with 1  seated rest break, needing to hold onto IV pole for support despite cues to let go; 1 LOB requiring assist to maintain upright,.  Stairs Stairs: Yes Stairs assistance: Min guard Stair Management: Alternating pattern;Step to pattern;One rail Right Number of Stairs: 3(+ 2 steps x2 bouts) General stair comments: Cues for safety.   Wheelchair Mobility    Modified Rankin (Stroke Patients Only)       Balance Overall balance assessment: History of Falls;Needs assistance Sitting-balance support: Feet supported;No upper extremity supported Sitting balance-Leahy Scale: Good     Standing balance support: During functional activity Standing balance-Leahy Scale: Fair Standing balance comment: Requires UE support for dynamic tasks.                             Pertinent Vitals/Pain Pain Assessment: Faces Faces Pain Scale: Hurts little more Pain Location: hip Pain Descriptors / Indicators: Aching;Discomfort Pain Intervention(s): Monitored during session;Patient requesting pain meds-RN notified    Home Living Family/patient expects to be discharged to:: Private residence Living Arrangements: Alone Available Help at Discharge: Family;Available PRN/intermittently Type of Home: House Home Access: Level entry     Home Layout: One level Home Equipment: Cane - single point;Walker - 2 wheels;Grab bars - tub/shower      Prior Function Level of Independence: Independent with assistive device(s)         Comments: Uses walking stick for mobility, does own ADLs. Drives. Reports she will be going to stay with her daughter at d/c.     Hand Dominance   Dominant Hand: Right    Extremity/Trunk Assessment   Upper Extremity Assessment Upper Extremity Assessment: Defer to OT evaluation  Lower Extremity Assessment Lower Extremity Assessment: Generalized weakness       Communication   Communication: HOH  Cognition Arousal/Alertness: Awake/alert Behavior During Therapy:  Anxious Overall Cognitive Status: Impaired/Different from baseline Area of Impairment: Safety/judgement;Following commands                       Following Commands: Follows multi-step commands inconsistently Safety/Judgement: Decreased awareness of safety;Decreased awareness of deficits     General Comments: Requires increased time and repetition to perform tasks. HOH. Poor safety awareness.      General Comments      Exercises     Assessment/Plan    PT Assessment Patient needs continued PT services  PT Problem List Decreased strength;Decreased mobility;Decreased balance;Decreased cognition;Decreased activity tolerance       PT Treatment Interventions Functional mobility training;Balance training;Patient/family education;Therapeutic activities;Gait training;Therapeutic exercise;Neuromuscular re-education;Cognitive remediation;DME instruction    PT Goals (Current goals can be found in the Care Plan section)  Acute Rehab PT Goals Patient Stated Goal: to go home PT Goal Formulation: With patient Time For Goal Achievement: 09/10/18 Potential to Achieve Goals: Good    Frequency Min 3X/week   Barriers to discharge        Co-evaluation               AM-PAC PT "6 Clicks" Daily Activity  Outcome Measure Difficulty turning over in bed (including adjusting bedclothes, sheets and blankets)?: None Difficulty moving from lying on back to sitting on the side of the bed? : None Difficulty sitting down on and standing up from a chair with arms (e.g., wheelchair, bedside commode, etc,.)?: None Help needed moving to and from a bed to chair (including a wheelchair)?: A Little Help needed walking in hospital room?: A Little Help needed climbing 3-5 steps with a railing? : A Little 6 Click Score: 21    End of Session Equipment Utilized During Treatment: Gait belt Activity Tolerance: Patient tolerated treatment well Patient left: in chair;with call bell/phone within  reach;with chair alarm set Nurse Communication: Mobility status PT Visit Diagnosis: Unsteadiness on feet (R26.81);Difficulty in walking, not elsewhere classified (R26.2)    Time: 1351-1411 PT Time Calculation (min) (ACUTE ONLY): 20 min   Charges:   PT Evaluation $PT Eval Moderate Complexity: 1 Mod          Wray Kearns, PT, DPT Acute Rehabilitation Services Pager [phone] Office [phone]      Marguarite Arbour A Sabra Heck 08/27/2018, 4:01 PM

## 2018-08-28 MED ORDER — ACETAMINOPHEN 325 MG PO TABS: 650.0000 mg | ORAL_TABLET | Freq: Four times a day (QID) | ORAL | Status: DC | PRN

## 2018-08-28 MED ORDER — CLONAZEPAM 1 MG PO TABS: 0.5000 mg | ORAL_TABLET | Freq: Two times a day (BID) | ORAL | Status: DC | PRN

## 2018-08-28 MED ORDER — FAMOTIDINE 20 MG PO TABS: 20.0000 mg | ORAL_TABLET | Freq: Two times a day (BID) | ORAL | Status: DC

## 2018-08-28 MED ORDER — TRAZODONE HCL 100 MG PO TABS: 100.0000 mg | ORAL_TABLET | Freq: Every day | ORAL | 0 refills | Status: DC

## 2018-08-28 MED ORDER — GABAPENTIN 600 MG PO TABS: 300.0000 mg | ORAL_TABLET | Freq: Three times a day (TID) | ORAL | 0 refills | Status: DC

## 2018-08-28 NOTE — Progress Notes (Signed)
Patient given discharge instruction and summary. No new questions or concerns. Able to teach back and verbalize understanding. IV and telemetry removed.

## 2018-08-28 NOTE — Care Management Note (Signed)
Case Management Note  Patient Details  Name: [name] MRN: [id number] Date of Birth: 1933/02/09  Subjective/Objective:        Pt admitted with acute encephalopathy. She is from home alone. Daughter is supportive and checks on her mother. Pt without DME. She denies issues with transportation and medications.            Action/Plan: Pt discharging home with [facility] services. CM provided choice and they selected [facility]. Tiffany with [facility] notified and accepted the referral.  Pt with orders for walker and 3 in1. James with [facility] DME notified and delivered the equipment to the room.  Pt asking for paper scrubs to go home in. Unit secretary and bedside RN updated. Daughter to provide transport home.  Expected Discharge Date:  08/28/18               Expected Discharge Plan:  [place]  In-House Referral:     Discharge planning Services  CM Consult  Post Acute Care Choice:  Durable Medical Equipment, Home Health Choice offered to:  Patient, Adult Children  DME Arranged:  3-N-1, Walker rolling DME Agency:  [place]:  PT, OT [place] Agency:  [facility] (now [facility])  Status of Service:  Completed, signed off  If discussed at [place] of Stay Meetings, dates discussed:    Additional Comments:  Pollie Friar, RN 08/28/2018, 3:06 PM

## 2018-08-28 NOTE — Progress Notes (Signed)
Physical Therapy Treatment Patient Details Name: [name] MRN: [id number] DOB: 1932/12/14 Today's Date: 08/28/2018    History of Present Illness Patient is a 84 y/o female who presents with 2 days of mild confusion followed by an episode concerning for seizure with postictal aphasia and hemianopia. Admitted for possible seizures. Head CT/brain MRI-unremarkable. EEG-normal. PMH includes dementia, DM, HTN, Hepatitis, anxiety, depression, CHF.    PT Comments    Patient progressing well towards PT goals. Balance much improved today with use of RW for support. Tolerated stair training with min guard assist for balance/safety. Encouraged to have daughter present for stairs initially to safely enter home. Pt seems to have been awareness of deficits today- "I cannot walk on my own." Plans to discharge home with support of daughter. Will need RW, BSC at home. Will follow acutely.    Follow Up Recommendations  Home health PT;Supervision for mobility/OOB     Equipment Recommendations  Rolling walker with 5" wheels;3in1 (PT);Other (comment)(tub bench)    Recommendations for Other Services       Precautions / Restrictions Precautions Precautions: Fall Restrictions Weight Bearing Restrictions: No    Mobility  Bed Mobility Overal bed mobility: Needs Assistance Bed Mobility: Supine to Sit     Supine to sit: Min assist     General bed mobility comments: up in chair upon PT arrival.  Transfers Overall transfer level: Needs assistance Equipment used: None;Rolling walker (2 wheeled) Transfers: Sit to/from Stand Sit to Stand: Supervision         General transfer comment: Supervision for safety. Stood from Youth worker.   Ambulation/Gait Ambulation/Gait assistance: Min guard;Supervision Gait Distance (Feet): 150 Feet Assistive device: Rolling walker (2 wheeled) Gait Pattern/deviations: Step-through pattern;Decreased stride length Gait velocity: decreased   General Gait Details:  Slow, antalgic like gait with use of RW for support. No LOB today.    Stairs Stairs: Yes Stairs assistance: Min guard Stair Management: One rail Right;Step to pattern Number of Stairs: 4 General stair comments: Cues for safety.    Wheelchair Mobility    Modified Rankin (Stroke Patients Only)       Balance Overall balance assessment: History of Falls;Needs assistance Sitting-balance support: Feet supported;No upper extremity supported Sitting balance-Leahy Scale: Good     Standing balance support: During functional activity Standing balance-Leahy Scale: Fair Standing balance comment: Requires UE support for dynamic tasks but able to stand statically wihtout UE support.                            Cognition Arousal/Alertness: Awake/alert Behavior During Therapy: Impulsive Overall Cognitive Status: Impaired/Different from baseline Area of Impairment: Safety/judgement;Following commands;Awareness;Problem solving                       Following Commands: Follows multi-step commands with increased time Safety/Judgement: Decreased awareness of safety;Decreased awareness of deficits Awareness: Emergent Problem Solving: Slow processing;Requires verbal cues General Comments: not at baseline per daughter. Able to remember that she did stairs with PT yesterday. More safety awareness present today- "I cannot walk on my own."      Exercises      General Comments General comments (skin integrity, edema, etc.): Daughter present today.      Pertinent Vitals/Pain Pain Assessment: No/denies pain Faces Pain Scale: No hurt    Home Living Family/patient expects to be discharged to:: Private residence Living Arrangements: Alone Available Help at Discharge: Family;Available 24 hours/day Type of Home: House  Home Access: Level entry   Home Layout: One level Home Equipment: Cane - single point Additional Comments: pt plans to dc to daughters home      Prior  Function Level of Independence: Independent with assistive device(s)      Comments: Patient independent with ADLs, IADLs (med mgmt and finances), mobility using cane, and driving    PT Goals (current goals can now be found in the care plan section) Acute Rehab PT Goals Patient Stated Goal: to go home Progress towards PT goals: Progressing toward goals    Frequency    Min 3X/week      PT Plan Current plan remains appropriate;Equipment recommendations need to be updated    Co-evaluation              AM-PAC PT "6 Clicks" Daily Activity  Outcome Measure  Difficulty turning over in bed (including adjusting bedclothes, sheets and blankets)?: None Difficulty moving from lying on back to sitting on the side of the bed? : None Difficulty sitting down on and standing up from a chair with arms (e.g., wheelchair, bedside commode, etc,.)?: None Help needed moving to and from a bed to chair (including a wheelchair)?: None Help needed walking in hospital room?: A Little Help needed climbing 3-5 steps with a railing? : A Little 6 Click Score: 22    End of Session Equipment Utilized During Treatment: Gait belt Activity Tolerance: Patient tolerated treatment well Patient left: in chair;with call bell/phone within reach;with chair alarm set Nurse Communication: Mobility status PT Visit Diagnosis: Unsteadiness on feet (R26.81);Difficulty in walking, not elsewhere classified (R26.2)     Time: 0923-3007 PT Time Calculation (min) (ACUTE ONLY): 19 min  Charges:  $Gait Training: 8-22 mins                     Wray Kearns, PT, DPT Acute Rehabilitation Services Pager [phone] Office [phone]       [place] 08/28/2018, 11:39 AM

## 2018-08-28 NOTE — Evaluation (Signed)
Occupational Therapy Evaluation Patient Details Name: [name] MRN: [id number] DOB: 01-20-1933 Today's Date: 08/28/2018    History of Present Illness Patient is a 84 y/o female who presents with 2 days of mild confusion followed by an episode concerning for seizure with postictal aphasia and hemianopia. Admitted for possible seizures. Head CT/brain MRI-unremarkable. EEG-normal. PMH includes dementia, DM, HTN, Hepatitis, anxiety, depression, CHF.   Clinical Impression   PTA patient independent with ADLs, IADLs (meds and finances), driving, and mobility using cane. She currently was admitted for above and limited by impaired cognition (memory, safety, problem solving), decreased activity tolerance, generalized weakness, and impaired balance.  She completes bed mobility with min assist, toilet transfers with min assist, toileting with min guard assist, grooming with min guard assist, UB ADL with setup assist and LB ADL with min assist. Pt plans to dc home to daughters home, where she will have 24/7 support. She will benefit from continued OT services while admitted and after dc at [facility] level in order to optimize independence with ADLs and IADLs. Will continue to follow.    Follow Up Recommendations  Home health OT;Supervision/Assistance - 24 hour    Equipment Recommendations  3 in 1 bedside commode;Tub/shower bench;Other (comment)(RW; daughter plans to self purchase tub bench )    Recommendations for Other Services       Precautions / Restrictions Precautions Precautions: Fall Restrictions Weight Bearing Restrictions: No      Mobility Bed Mobility Overal bed mobility: Needs Assistance Bed Mobility: Supine to Sit     Supine to sit: Min assist     General bed mobility comments: min assist for trunk support to EOB   Transfers Overall transfer level: Needs assistance Equipment used: Rolling walker (2 wheeled) Transfers: Sit to/from Stand Sit to Stand: Min assist;Min guard          General transfer comment: min guard from EOB and min assist from commode    Balance Overall balance assessment: History of Falls;Needs assistance Sitting-balance support: Feet supported;No upper extremity supported Sitting balance-Leahy Scale: Good     Standing balance support: No upper extremity supported;During functional activity Standing balance-Leahy Scale: Fair Standing balance comment: min guard for safety during ADL tasks at sink                            ADL either performed or assessed with clinical judgement   ADL Overall ADL's : Needs assistance/impaired     Grooming: Min guard;Standing   Upper Body Bathing: Set up;Sitting   Lower Body Bathing: Minimal assistance;Sit to/from stand   Upper Body Dressing : Set up;Sitting   Lower Body Dressing: Minimal assistance;Sit to/from stand   Toilet Transfer: Minimal assistance;Ambulation;Regular Toilet;Grab bars;RW Armed forces technical officer Details (indicate cue type and reason): min assist to ascend from commode  Toileting- Water quality scientist and Hygiene: Min guard;Sit to/from stand;Sitting/lateral lean       Functional mobility during ADLs: Min guard;Rolling walker;Cueing for safety General ADL Comments: Pt engaged in bed mobility, ADLs, and short distance mobility in room.      Vision Baseline Vision/History: Wears glasses Vision Assessment?: No apparent visual deficits Additional Comments: functionally no visual impairments noted     Perception     Praxis      Pertinent Vitals/Pain Pain Assessment: Faces Faces Pain Scale: No hurt     Hand Dominance Right   Extremity/Trunk Assessment Upper Extremity Assessment Upper Extremity Assessment: Generalized weakness   Lower Extremity Assessment Lower  Extremity Assessment: Defer to PT evaluation       Communication Communication Communication: HOH   Cognition Arousal/Alertness: Awake/alert Behavior During Therapy: WFL for tasks  assessed/performed Overall Cognitive Status: Impaired/Different from baseline Area of Impairment: Safety/judgement;Following commands;Awareness;Problem solving                       Following Commands: Follows multi-step commands with increased time Safety/Judgement: Decreased awareness of safety;Decreased awareness of deficits Awareness: Emergent Problem Solving: Slow processing;Requires verbal cues     General Comments  daughter present and supportive    Exercises     Shoulder Instructions      Home Living Family/patient expects to be discharged to:: Private residence Living Arrangements: Alone Available Help at Discharge: Family;Available 24 hours/day Type of Home: House Home Access: Level entry     Home Layout: One level     Bathroom Shower/Tub: Tub/shower unit;Walk-in shower(tub shower at daughters)   Biochemist, clinical: Standard     Home Equipment: Kasandra Knudsen - single point   Additional Comments: pt plans to dc to daughters home        Prior Functioning/Environment Level of Independence: Independent with assistive device(s)        Comments: Patient independent with ADLs, IADLs (med mgmt and finances), mobility using cane, and driving         OT Problem List: Decreased strength;Decreased activity tolerance;Impaired balance (sitting and/or standing);Decreased cognition;Decreased safety awareness;Decreased knowledge of use of DME or AE      OT Treatment/Interventions: Self-care/ADL training;Therapeutic exercise;DME and/or AE instruction;Therapeutic activities;Patient/family education;Balance training;Cognitive remediation/compensation    OT Goals(Current goals can be found in the care plan section) Acute Rehab OT Goals Patient Stated Goal: to go home OT Goal Formulation: With patient/family Time For Goal Achievement: 09/11/18 Potential to Achieve Goals: Good  OT Frequency: Min 2X/week   Barriers to D/C:            Co-evaluation               AM-PAC PT "6 Clicks" Daily Activity     Outcome Measure Help from another person eating meals?: None Help from another person taking care of personal grooming?: A Little Help from another person toileting, which includes using toliet, bedpan, or urinal?: A Little Help from another person bathing (including washing, rinsing, drying)?: A Little Help from another person to put on and taking off regular upper body clothing?: None Help from another person to put on and taking off regular lower body clothing?: A Little 6 Click Score: 20   End of Session Equipment Utilized During Treatment: Rolling walker Nurse Communication: Mobility status  Activity Tolerance: Patient tolerated treatment well Patient left: in chair;with chair alarm set;with call bell/phone within reach;with family/visitor present  OT Visit Diagnosis: Other abnormalities of gait and mobility (R26.89);History of falling (Z91.81);Muscle weakness (generalized) (M62.81);Other symptoms and signs involving cognitive function                Time: 1751-0258 OT Time Calculation (min): 30 min Charges:  OT General Charges $OT Visit: 1 Visit OT Evaluation $OT Eval Moderate Complexity: 1 Mod OT Treatments $Self Care/Home Management : 8-22 mins  Delight Stare, OT Acute Rehabilitation Services Pager [phone] Office [phone]   Delight Stare 08/28/2018, 9:41 AM

## 2018-08-31 LAB — CULTURE, BLOOD (ROUTINE X 2)
CULTURE: NO GROWTH
Culture: NO GROWTH
SPECIAL REQUESTS: ADEQUATE
Special Requests: ADEQUATE

## 2018-08-31 NOTE — Discharge Summary (Signed)
Physician Discharge Summary  FLARA STORTI FTD:[id number] DOB: 11-04-1933 DOA: 08/25/2018  PCP: [name], MD  Admit date: 08/25/2018 Discharge date: 08/28/2018  Time spent: 35 minutes  Recommendations for Outpatient Follow-up:  1. PCP Dr.Jones in 1 week 2. Outpatient NEurology in 1-10months 3. Home health PT   Discharge Diagnoses:  Principal Problem:   Acute metabolic encephalopathy   Suspected Partial seizures   Essential hypertension   OSA (obstructive sleep apnea)   Sleep-related hypoventilation   Hypokalemia   Seizure (HCC)   UTI (urinary tract infection)   GERD (gastroesophageal reflux disease)   Chronic diastolic CHF (congestive heart failure) (HCC)   Hypercalcemia   Anxiety   Discharge Condition: stable  Diet recommendation: heart healthy  Filed Weights   08/25/18 1346  Weight: 90.7 kg    History of present illness:  84 year old female with history of hypertension, diet-controlled diabetes mellitus, GERD, anxiety, aortic stenosis, OSA on CPAP, obesity, remote seizures not on medications presented from home with altered mental status.  As per her daughter she was found to be confused since 2 days prior to admission.  At baseline she is well oriented.  On the afternoon of admission she had mild body twitching and then could not speak  Hospital Course:   Acute metabolic encephalopathy -Based on history suspect complex partial seizures, was associated with post ictal aphasia and hemianopsia -Neuro consulted, MRI  was negative for intracranial abnormalities -Per neuro recommendations her Gabapentin dose was increased -she was stable after this and resuming her Klonopin at a lower dose -PT eval completed, HHPT recommended and set up at discharge, advised to avoid driving and [place] laws. -FU with PCP Dr.Jones and Neurology as outpatient  Bacteruria -urine cultures came back negative, empiric ABx were stopped    Essential hypertension  -resumed home  regimen    OSA (obstructive sleep apnea)   Sleep-related hypoventilation Continue nighttime CPAP.    Hypokalemia Replenished.  Chronic anxiety -Resumed low-dose scheduled Klonopin    Chronic diastolic CHF (congestive heart failure) (HCC) Euvolemic.  Continue home meds    Hypercalcemia   Anxiety  Procedures:  EEG  Consultations: Neurology Discharge Exam: Vitals:   08/28/18 0753 08/28/18 1128  BP: (!) 175/76 (!) 173/76  Pulse: (!) 57 64  Resp: 20 16  Temp: (!) 97.5 F (36.4 C) 98 F (36.7 C)  SpO2: 97% 94%    General: AAOx3 Cardiovascular: S1S2/RRR Respiratory: CTAB  Discharge Instructions   Discharge Instructions    Diet - low sodium heart healthy   Complete by:  As directed    Increase activity slowly   Complete by:  As directed      Allergies as of 08/28/2018      Reactions   Amlodipine Other (See Comments)   Reaction:  Dizziness    Enalapril Cough   Losartan Potassium Other (See Comments)   Reaction:  GI pain    Morphine And Related Itching, Nausea And Vomiting   Metformin Diarrhea      Medication List    STOP taking these medications   conjugated estrogens vaginal cream Commonly known as:  PREMARIN   gabapentin 100 MG capsule Commonly known as:  NEURONTIN Replaced by:  gabapentin 600 MG tablet   hydrALAZINE 50 MG tablet Commonly known as:  APRESOLINE   oxyCODONE-acetaminophen 10-325 MG tablet Commonly known as:  PERCOCET   potassium chloride 10 MEQ tablet Commonly known as:  K-DUR   promethazine 12.5 MG tablet Commonly known as:  PHENERGAN  TAKE these medications   acetaminophen 325 MG tablet Commonly known as:  TYLENOL Take 2 tablets (650 mg total) by mouth every 6 (six) hours as needed for moderate pain.   calcium carbonate 600 MG Tabs tablet Commonly known as:  OS-CAL Take 600 mg by mouth 2 (two) times daily with a meal.   carvedilol 6.25 MG tablet Commonly known as:  COREG TAKE 1 TABLET BY MOUTH TWICE A DAY  WITH MEALS   chlorthalidone 25 MG tablet Commonly known as:  HYGROTON TAKE 1 TABLET BY MOUTH ONCE DAILY   Cholecalciferol 2000 units Tabs Take 2 tablets (4,000 Units total) by mouth daily.   clonazePAM 1 MG tablet Commonly known as:  KLONOPIN Take 0.5 tablets (0.5 mg total) by mouth 2 (two) times daily as needed. for anxiety What changed:  See the new instructions.   gabapentin 600 MG tablet Commonly known as:  NEURONTIN Take 0.5 tablets (300 mg total) by mouth 3 (three) times daily. Replaces:  gabapentin 100 MG capsule   MYRBETRIQ 25 MG Tb24 tablet Generic drug:  mirabegron ER TAKE 1 TABLET BY MOUTH ONCE DAILY   omeprazole 40 MG capsule Commonly known as:  PRILOSEC Take 1 capsule (40 mg total) by mouth daily.   traZODone 100 MG tablet Commonly known as:  DESYREL Take 1 tablet (100 mg total) by mouth at bedtime. What changed:  how much to take      Allergies  Allergen Reactions  . Amlodipine Other (See Comments)    Reaction:  Dizziness   . Enalapril Cough  . Losartan Potassium Other (See Comments)    Reaction:  GI pain   . Morphine And Related Itching and Nausea And Vomiting  . Metformin Diarrhea   Follow-up Information    [name], MD. Schedule an appointment as soon as possible for a visit in 1 week(s).   Specialty:  Internal Medicine Contact information: [address] [address] [phone]        NEurology. Schedule an appointment as soon as possible for a visit in 1 month(s).            The results of significant diagnostics from this hospitalization (including imaging, microbiology, ancillary and laboratory) are listed below for reference.    Significant Diagnostic Studies: Ct Angio Head W Or Wo Contrast  Result Date: 08/25/2018 CLINICAL DATA:  Initial evaluation for acute altered mental status. EXAM: CT ANGIOGRAPHY HEAD AND NECK CT PERFUSION BRAIN TECHNIQUE: Multidetector CT imaging of the head and neck was  performed using the standard protocol during bolus administration of intravenous contrast. Multiplanar CT image reconstructions and MIPs were obtained to evaluate the vascular anatomy. Carotid stenosis measurements (when applicable) are obtained utilizing NASCET criteria, using the distal internal carotid diameter as the denominator. Multiphase CT imaging of the brain was performed following IV bolus contrast injection. Subsequent parametric perfusion maps were calculated using RAPID software. CONTRAST:  179mL ISOVUE-370 IOPAMIDOL (ISOVUE-370) INJECTION 76% COMPARISON:  Prior CT from earlier the same day. FINDINGS: CT HEAD FINDINGS Brain: Atrophy with chronic microvascular ischemic disease again noted, stable. Small remote right thalamic and right cerebellar infarcts noted. No evidence for acute or interval intracranial hemorrhage. No acute large vessel territory infarct. No mass lesion, midline shift or mass effect. No hydrocephalus. No extra-axial fluid collection. Vascular: No hyperdense vessel. Scattered vascular calcifications noted within the carotid siphons. Skull: Scalp soft tissues and calvarium within normal limits. Sinuses/Orbits: Right maxillary sinus retention cyst. Acute on chronic right  sphenoid sinusitis. Mastoid air cells and middle ear cavities are clear. Globes and orbital soft tissues within normal limits. Other: None. CTA NECK FINDINGS Aortic arch: Visualized aortic arch of normal caliber with normal branch pattern. Incidental note made of a bovine arch with common origin of the left common and brachiocephalic arteries. Mild atherosclerotic change about the arch and origin of the great vessels without hemodynamically significant stenosis. Visualized subclavian arteries widely patent. Right carotid system: Right common carotid artery patent from its origin to the bifurcation without stenosis. A centric calcified plaque about the proximal right ICA with associated stenosis of up to 70% by NASCET  criteria. Right ICA tortuous but otherwise patent to the skull base without stenosis, dissection, or occlusion Left carotid system: Left common carotid artery tortuous proximally but widely patent to the bifurcation. Scattered calcified plaque about the proximal left ICA with associated stenosis of up to 75-80% by NASCET criteria. Left ICA tortuous but otherwise patent to the skull base. Both internal carotid arteries medialized into the retropharyngeal space. Vertebral arteries: Both of the vertebral arteries arise from the subclavian arteries. Left vertebral artery dominant. Mild plaque at the origin of the vertebral arteries bilaterally without significant stenosis. Vertebral arteries widely patent within the neck without stenosis, dissection, or occlusion. Skeleton: No acute osseous abnormality. No discrete osseous lesions. Prominent osteoarthritic changes about the C1-2 articulation. Advanced right-sided facet arthrosis at C2-3. Other neck: Thyroid diffuse lead enlarged and heterogeneous in appearance, likely related to goiter. Soft tissues of the neck demonstrate no acute finding. Upper chest: Visualized upper chest and mediastinum within normal limits. Small focus of tree-in-bud nodular opacity at the posterior left upper lobe (series 11, image 159), consistent with mild small airways disease. Review of the MIP images confirms the above findings CTA HEAD FINDINGS Anterior circulation: Petrous segments widely patent bilaterally. Calcified plaque within the cavernous/supraclinoid ICAs with resultant mild to moderate multifocal narrowing, right slightly worse than left. ICA termini widely patent. A1 segments patent bilaterally. Normal anterior communicating artery. Anterior cerebral arteries patent to their distal aspects without stenosis. No M1 stenosis or occlusion. No proximal M2 occlusion. Distal MCA branches well perfused and symmetric. Posterior circulation: Focal non stenotic plaque noted within the  dominant left V4 segment. Left vertebral artery otherwise widely patent to the vertebrobasilar junction. Right vertebral artery diminutive but widely patent as well. Posterior inferior cerebral arteries patent bilaterally. Basilar tortuous but patent to its distal aspect without high-grade stenosis. Superior cerebral arteries patent bilaterally. Right PCA supplied via the basilar. Fetal type origin of the left PCA. Single short-segment moderate left P2 stenosis noted. PCAs otherwise widely patent to their distal aspects. Venous sinuses: Not well assessed due to arterial timing of the contrast bolus, but grossly patent. Anatomic variants: Fetal type left PCA.  No intracranial aneurysm. Delayed phase: Not performed. Review of the MIP images confirms the above findings CT Brain Perfusion Findings: CBF (<30%) Volume: 54mL Perfusion (Tmax>6.0s) volume: 42mL Mismatch Volume: 75mL Infarction Location:Negative. IMPRESSION: 1. Negative CTA with no large vessel occlusion identified. 2. Negative CT perfusion with no evidence for core infarct or perfusion abnormality. 3. Atheromatous stenoses about the carotid bifurcations bilaterally, measuring up to 70% on the right and 75-80% on the left. 4. Additional mild scattered atherosclerotic change elsewhere within the major arterial vasculature of the head and neck as above. No other high-grade or correctable stenosis. 5. Thyroid goiter. Electronically Signed   By: Jeannine Boga M.D.   On: 08/25/2018 20:03   Ct Head Wo Contrast  Result Date: 08/25/2018 CLINICAL DATA:  Altered mental status 2 days. EXAM: CT HEAD WITHOUT CONTRAST TECHNIQUE: Contiguous axial images were obtained from the base of the skull through the vertex without intravenous contrast. COMPARISON:  08/05/2017 and 10/26/2013 FINDINGS: Brain: Ventricles, cisterns and other CSF spaces are within normal. There is mild chronic ischemic microvascular disease. Tiny old right thalamic lacunar infarct. No mass, mass  effect, shift of midline structures or acute hemorrhage. No evidence of acute infarction. Vascular: No hyperdense vessel or unexpected calcification. Skull: Well-defined sclerotic focus over the left frontal skull unchanged. Sinuses/Orbits: No acute finding. Other: None. IMPRESSION: No acute findings. Mild chronic ischemic microvascular disease. Small old thalamic lacunar infarct. Electronically Signed   By: Marin Olp M.D.   On: 08/25/2018 16:09   Ct Angio Neck W Or Wo Contrast  Result Date: 08/25/2018 CLINICAL DATA:  Initial evaluation for acute altered mental status. EXAM: CT ANGIOGRAPHY HEAD AND NECK CT PERFUSION BRAIN TECHNIQUE: Multidetector CT imaging of the head and neck was performed using the standard protocol during bolus administration of intravenous contrast. Multiplanar CT image reconstructions and MIPs were obtained to evaluate the vascular anatomy. Carotid stenosis measurements (when applicable) are obtained utilizing NASCET criteria, using the distal internal carotid diameter as the denominator. Multiphase CT imaging of the brain was performed following IV bolus contrast injection. Subsequent parametric perfusion maps were calculated using RAPID software. CONTRAST:  127mL ISOVUE-370 IOPAMIDOL (ISOVUE-370) INJECTION 76% COMPARISON:  Prior CT from earlier the same day. FINDINGS: CT HEAD FINDINGS Brain: Atrophy with chronic microvascular ischemic disease again noted, stable. Small remote right thalamic and right cerebellar infarcts noted. No evidence for acute or interval intracranial hemorrhage. No acute large vessel territory infarct. No mass lesion, midline shift or mass effect. No hydrocephalus. No extra-axial fluid collection. Vascular: No hyperdense vessel. Scattered vascular calcifications noted within the carotid siphons. Skull: Scalp soft tissues and calvarium within normal limits. Sinuses/Orbits: Right maxillary sinus retention cyst. Acute on chronic right sphenoid sinusitis. Mastoid  air cells and middle ear cavities are clear. Globes and orbital soft tissues within normal limits. Other: None. CTA NECK FINDINGS Aortic arch: Visualized aortic arch of normal caliber with normal branch pattern. Incidental note made of a bovine arch with common origin of the left common and brachiocephalic arteries. Mild atherosclerotic change about the arch and origin of the great vessels without hemodynamically significant stenosis. Visualized subclavian arteries widely patent. Right carotid system: Right common carotid artery patent from its origin to the bifurcation without stenosis. A centric calcified plaque about the proximal right ICA with associated stenosis of up to 70% by NASCET criteria. Right ICA tortuous but otherwise patent to the skull base without stenosis, dissection, or occlusion Left carotid system: Left common carotid artery tortuous proximally but widely patent to the bifurcation. Scattered calcified plaque about the proximal left ICA with associated stenosis of up to 75-80% by NASCET criteria. Left ICA tortuous but otherwise patent to the skull base. Both internal carotid arteries medialized into the retropharyngeal space. Vertebral arteries: Both of the vertebral arteries arise from the subclavian arteries. Left vertebral artery dominant. Mild plaque at the origin of the vertebral arteries bilaterally without significant stenosis. Vertebral arteries widely patent within the neck without stenosis, dissection, or occlusion. Skeleton: No acute osseous abnormality. No discrete osseous lesions. Prominent osteoarthritic changes about the C1-2 articulation. Advanced right-sided facet arthrosis at C2-3. Other neck: Thyroid diffuse lead enlarged and heterogeneous in appearance, likely related to goiter. Soft tissues of the neck demonstrate no acute finding.  Upper chest: Visualized upper chest and mediastinum within normal limits. Small focus of tree-in-bud nodular opacity at the posterior left upper  lobe (series 11, image 159), consistent with mild small airways disease. Review of the MIP images confirms the above findings CTA HEAD FINDINGS Anterior circulation: Petrous segments widely patent bilaterally. Calcified plaque within the cavernous/supraclinoid ICAs with resultant mild to moderate multifocal narrowing, right slightly worse than left. ICA termini widely patent. A1 segments patent bilaterally. Normal anterior communicating artery. Anterior cerebral arteries patent to their distal aspects without stenosis. No M1 stenosis or occlusion. No proximal M2 occlusion. Distal MCA branches well perfused and symmetric. Posterior circulation: Focal non stenotic plaque noted within the dominant left V4 segment. Left vertebral artery otherwise widely patent to the vertebrobasilar junction. Right vertebral artery diminutive but widely patent as well. Posterior inferior cerebral arteries patent bilaterally. Basilar tortuous but patent to its distal aspect without high-grade stenosis. Superior cerebral arteries patent bilaterally. Right PCA supplied via the basilar. Fetal type origin of the left PCA. Single short-segment moderate left P2 stenosis noted. PCAs otherwise widely patent to their distal aspects. Venous sinuses: Not well assessed due to arterial timing of the contrast bolus, but grossly patent. Anatomic variants: Fetal type left PCA.  No intracranial aneurysm. Delayed phase: Not performed. Review of the MIP images confirms the above findings CT Brain Perfusion Findings: CBF (<30%) Volume: 69mL Perfusion (Tmax>6.0s) volume: 48mL Mismatch Volume: 64mL Infarction Location:Negative. IMPRESSION: 1. Negative CTA with no large vessel occlusion identified. 2. Negative CT perfusion with no evidence for core infarct or perfusion abnormality. 3. Atheromatous stenoses about the carotid bifurcations bilaterally, measuring up to 70% on the right and 75-80% on the left. 4. Additional mild scattered atherosclerotic change  elsewhere within the major arterial vasculature of the head and neck as above. No other high-grade or correctable stenosis. 5. Thyroid goiter. Electronically Signed   By: Jeannine Boga M.D.   On: 08/25/2018 20:03   Mr Brain Wo Contrast  Result Date: 08/26/2018 CLINICAL DATA:  Altered mental status. New onset seizures. History of hypertension, hyperlipidemia, diabetes. EXAM: MRI HEAD WITHOUT CONTRAST TECHNIQUE: Multiplanar, multiecho pulse sequences of the brain and surrounding structures were obtained without intravenous contrast. COMPARISON:  CT HEAD August 25, 2018 FINDINGS: Moderately motion degraded examination. INTRACRANIAL CONTENTS: No reduced diffusion to suggest acute ischemia. No susceptibility artifact to suggest hemorrhage. The ventricles and sulci are normal for patient's age. Patchy supratentorial white matter FLAIR T2 hyperintensities. Old small bilateral cerebellar infarcts. Old RIGHT thalamus lacunar infarct. No suspicious parenchymal signal, masses, mass effect. No abnormal extra-axial fluid collections. No extra-axial masses. Nondiagnostic thin slice coronal imaging of the hippocampi. VASCULAR: Normal major intracranial vascular flow voids present at skull base. SKULL AND UPPER CERVICAL SPINE: Mild empty sella. No suspicious calvarial bone marrow signal. Craniocervical junction maintained. SINUSES/ORBITS: The mastoid air-cells and included paranasal sinuses are well-aerated.The included ocular globes and orbital contents are non-suspicious. Status post bilateral ocular lens implants. OTHER: Patient is edentulous. IMPRESSION: 1. No acute intracranial process on this motion degraded examination. 2. Old small RIGHT thalamus and bilateral cerebellar infarcts. 3. Moderate chronic small vessel ischemic changes. Electronically Signed   By: Elon Alas M.D.   On: 08/26/2018 22:39   Ct Abdomen Pelvis W Contrast  Result Date: 08/25/2018 CLINICAL DATA:  Acute abdominal pain EXAM: CT  ABDOMEN AND PELVIS WITH CONTRAST TECHNIQUE: Multidetector CT imaging of the abdomen and pelvis was performed using the standard protocol following bolus administration of intravenous contrast. CONTRAST:  145mL ISOVUE-370  IOPAMIDOL (ISOVUE-370) INJECTION 76% COMPARISON:  CT 07/07/2018, 05/20/2014 FINDINGS: Lower chest: Lung bases demonstrate no acute consolidation or effusion. Heart size upper limits of normal Hepatobiliary: Status post cholecystectomy. No focal hepatic abnormality. Stable slight enlargement of the extrahepatic bile duct. Pancreas: Unremarkable. No pancreatic ductal dilatation or surrounding inflammatory changes. Spleen: Normal in size without focal abnormality. Adrenals/Urinary Tract: Adrenal glands are stable. 2.7 cm right adrenal mass. Left adrenal gland is normal. No hydronephrosis. Subcentimeter hypodense renal lesions too small to further characterize. Bladder normal Stomach/Bowel: Stomach is nonenlarged. No dilated small bowel. No colon wall thickening. Sigmoid colon diverticula without acute inflammatory process. Vascular/Lymphatic: Nonaneurysmal aorta. Extensive aortic atherosclerosis. No significantly enlarged lymph nodes. Reproductive: Status post hysterectomy. No adnexal masses. Other: Negative for free air or free fluid. Fat within the bilateral inguinal regions. Musculoskeletal: Degenerative changes of the spine. No acute or suspicious abnormality. IMPRESSION: 1. No CT evidence for acute intra-abdominal or pelvic abnormality. 2. Status post cholecystectomy. Slight prominence of the extrahepatic bile duct but without interval increase in dilatation. No intra hepatic biliary dilatation is visualized. 3. Stable 2.7 cm right adrenal gland adenoma 4. Sigmoid colon diverticular disease without acute inflammatory process. Electronically Signed   By: Donavan Foil M.D.   On: 08/25/2018 19:18   Ct Cerebral Perfusion W Contrast  Result Date: 08/25/2018 CLINICAL DATA:  Initial evaluation for  acute altered mental status. EXAM: CT ANGIOGRAPHY HEAD AND NECK CT PERFUSION BRAIN TECHNIQUE: Multidetector CT imaging of the head and neck was performed using the standard protocol during bolus administration of intravenous contrast. Multiplanar CT image reconstructions and MIPs were obtained to evaluate the vascular anatomy. Carotid stenosis measurements (when applicable) are obtained utilizing NASCET criteria, using the distal internal carotid diameter as the denominator. Multiphase CT imaging of the brain was performed following IV bolus contrast injection. Subsequent parametric perfusion maps were calculated using RAPID software. CONTRAST:  159mL ISOVUE-370 IOPAMIDOL (ISOVUE-370) INJECTION 76% COMPARISON:  Prior CT from earlier the same day. FINDINGS: CT HEAD FINDINGS Brain: Atrophy with chronic microvascular ischemic disease again noted, stable. Small remote right thalamic and right cerebellar infarcts noted. No evidence for acute or interval intracranial hemorrhage. No acute large vessel territory infarct. No mass lesion, midline shift or mass effect. No hydrocephalus. No extra-axial fluid collection. Vascular: No hyperdense vessel. Scattered vascular calcifications noted within the carotid siphons. Skull: Scalp soft tissues and calvarium within normal limits. Sinuses/Orbits: Right maxillary sinus retention cyst. Acute on chronic right sphenoid sinusitis. Mastoid air cells and middle ear cavities are clear. Globes and orbital soft tissues within normal limits. Other: None. CTA NECK FINDINGS Aortic arch: Visualized aortic arch of normal caliber with normal branch pattern. Incidental note made of a bovine arch with common origin of the left common and brachiocephalic arteries. Mild atherosclerotic change about the arch and origin of the great vessels without hemodynamically significant stenosis. Visualized subclavian arteries widely patent. Right carotid system: Right common carotid artery patent from its  origin to the bifurcation without stenosis. A centric calcified plaque about the proximal right ICA with associated stenosis of up to 70% by NASCET criteria. Right ICA tortuous but otherwise patent to the skull base without stenosis, dissection, or occlusion Left carotid system: Left common carotid artery tortuous proximally but widely patent to the bifurcation. Scattered calcified plaque about the proximal left ICA with associated stenosis of up to 75-80% by NASCET criteria. Left ICA tortuous but otherwise patent to the skull base. Both internal carotid arteries medialized into the retropharyngeal space. Vertebral arteries:  Both of the vertebral arteries arise from the subclavian arteries. Left vertebral artery dominant. Mild plaque at the origin of the vertebral arteries bilaterally without significant stenosis. Vertebral arteries widely patent within the neck without stenosis, dissection, or occlusion. Skeleton: No acute osseous abnormality. No discrete osseous lesions. Prominent osteoarthritic changes about the C1-2 articulation. Advanced right-sided facet arthrosis at C2-3. Other neck: Thyroid diffuse lead enlarged and heterogeneous in appearance, likely related to goiter. Soft tissues of the neck demonstrate no acute finding. Upper chest: Visualized upper chest and mediastinum within normal limits. Small focus of tree-in-bud nodular opacity at the posterior left upper lobe (series 11, image 159), consistent with mild small airways disease. Review of the MIP images confirms the above findings CTA HEAD FINDINGS Anterior circulation: Petrous segments widely patent bilaterally. Calcified plaque within the cavernous/supraclinoid ICAs with resultant mild to moderate multifocal narrowing, right slightly worse than left. ICA termini widely patent. A1 segments patent bilaterally. Normal anterior communicating artery. Anterior cerebral arteries patent to their distal aspects without stenosis. No M1 stenosis or  occlusion. No proximal M2 occlusion. Distal MCA branches well perfused and symmetric. Posterior circulation: Focal non stenotic plaque noted within the dominant left V4 segment. Left vertebral artery otherwise widely patent to the vertebrobasilar junction. Right vertebral artery diminutive but widely patent as well. Posterior inferior cerebral arteries patent bilaterally. Basilar tortuous but patent to its distal aspect without high-grade stenosis. Superior cerebral arteries patent bilaterally. Right PCA supplied via the basilar. Fetal type origin of the left PCA. Single short-segment moderate left P2 stenosis noted. PCAs otherwise widely patent to their distal aspects. Venous sinuses: Not well assessed due to arterial timing of the contrast bolus, but grossly patent. Anatomic variants: Fetal type left PCA.  No intracranial aneurysm. Delayed phase: Not performed. Review of the MIP images confirms the above findings CT Brain Perfusion Findings: CBF (<30%) Volume: 75mL Perfusion (Tmax>6.0s) volume: 104mL Mismatch Volume: 53mL Infarction Location:Negative. IMPRESSION: 1. Negative CTA with no large vessel occlusion identified. 2. Negative CT perfusion with no evidence for core infarct or perfusion abnormality. 3. Atheromatous stenoses about the carotid bifurcations bilaterally, measuring up to 70% on the right and 75-80% on the left. 4. Additional mild scattered atherosclerotic change elsewhere within the major arterial vasculature of the head and neck as above. No other high-grade or correctable stenosis. 5. Thyroid goiter. Electronically Signed   By: Jeannine Boga M.D.   On: 08/25/2018 20:03   Dg Chest Port 1 View  Result Date: 08/25/2018 CLINICAL DATA:  History of CHF.  Altered mental status. EXAM: PORTABLE CHEST 1 VIEW COMPARISON:  11/02/2017.  03/10/2015.  CT 05/19/2014. FINDINGS: Stable prominence of the superior mediastinum consistent with substernal goiter as previously noted on CT of 05/19/2014. Heart  size stable. Low lung volumes mild basilar atelectasis. No focal alveolar infiltrate. No pleural effusion or pneumothorax. IMPRESSION: 1.  Substernal goiter. 2. Low lung volumes with mild basilar atelectasis. Exam otherwise unremarkable. Electronically Signed   By: Marcello Moores  Register   On: 08/25/2018 15:04    Microbiology: Recent Results (from the past 240 hour(s))  Urine culture     Status: Abnormal   Collection Time: 08/25/18  6:38 PM  Result Value Ref Range Status   Specimen Description URINE, CATHETERIZED  Final   Special Requests NONE  Final   Culture (A)  Final    <10,000 COLONIES/mL INSIGNIFICANT GROWTH Performed at [facility], [address] [place], [place] [address]    Report Status 08/27/2018 FINAL  Final  Culture, blood (Routine X 2) w Reflex to ID Panel     Status: None   Collection Time: 08/26/18  1:06 AM  Result Value Ref Range Status   Specimen Description BLOOD LEFT HAND  Final   Special Requests   Final    BOTTLES DRAWN AEROBIC ONLY Blood Culture adequate volume   Culture   Final    NO GROWTH 5 DAYS Performed at [facility], [address] [place], [place] [address]    Report Status 08/31/2018 FINAL  Final  Culture, blood (Routine X 2) w Reflex to ID Panel     Status: None   Collection Time: 08/26/18  1:15 AM  Result Value Ref Range Status   Specimen Description BLOOD RIGHT HAND  Final   Special Requests   Final    BOTTLES DRAWN AEROBIC ONLY Blood Culture adequate volume   Culture   Final    NO GROWTH 5 DAYS Performed at [facility], [place] [address] [place], [place] [address]    Report Status 08/31/2018 FINAL  Final     Labs: Basic Metabolic Panel: Recent Labs  Lab 08/25/18 1453 08/25/18 1555  NA 141 141  K 3.1* 3.4*  CL 105 102  CO2  --  27  GLUCOSE 163* 133*  BUN 10 9  CREATININE 0.70 0.88  CALCIUM  --  10.4*   Liver Function Tests: Recent Labs  Lab 08/25/18 1555  AST 34  ALT 16  ALKPHOS 49  BILITOT 2.6*  PROT 7.3   ALBUMIN 4.5   Recent Labs  Lab 08/25/18 1803  LIPASE 27   Recent Labs  Lab 08/25/18 1555  AMMONIA 22   CBC: Recent Labs  Lab 08/25/18 1453 08/25/18 1555 08/27/18 0536  WBC  --  15.1* 8.4  NEUTROABS  --  12.1*  --   HGB 17.0* 15.8* 12.6  HCT 50.0* 48.8* 39.9  MCV  --  93.8 95.7  PLT  --  210 144*   Cardiac Enzymes: No results for input(s): CKTOTAL, CKMB, CKMBINDEX, TROPONINI in the last 168 hours. BNP: BNP (last 3 results) Recent Labs    08/26/18 0116  BNP 292.5*    ProBNP (last 3 results) No results for input(s): PROBNP in the last 8760 hours.  CBG: Recent Labs  Lab 08/26/18 0627 08/26/18 1146 08/26/18 1641 08/26/18 2343 08/27/18 1154  GLUCAP 117* 87 110* 106* 122*       Signed:  Domenic Polite MD.  [facility] 08/31/2018, 5:03 PM

## 2018-09-01 ENCOUNTER — Other Ambulatory Visit: Payer: Self-pay

## 2018-09-01 NOTE — Patient Outreach (Signed)
[name]) Care Management  09/01/2018  [name] 1933-04-28 [id number]  TELEPHONE SCREENING Referral date: 09/01/18 Referral source: Tier 4  Referral reason: High risk  Insurance: [employer] Attempt #1   Telephone call to patient regarding referral. Unable to reach patient. HIPAA compliant voice message left with call back phone number.   PLAN: RNCM will attempt 2nd telephone call to patient within 4 business days. RNCM will send outreach letter.   Quinn Plowman RN,BSN, [place] Telephonic  [phone]

## 2018-09-02 DIAGNOSIS — F3289 Other specified depressive episodes: Secondary | ICD-10-CM | POA: Diagnosis not present

## 2018-09-02 DIAGNOSIS — I350 Nonrheumatic aortic (valve) stenosis: Secondary | ICD-10-CM | POA: Diagnosis not present

## 2018-09-02 DIAGNOSIS — E119 Type 2 diabetes mellitus without complications: Secondary | ICD-10-CM | POA: Diagnosis not present

## 2018-09-02 DIAGNOSIS — G8929 Other chronic pain: Secondary | ICD-10-CM | POA: Diagnosis not present

## 2018-09-02 DIAGNOSIS — M549 Dorsalgia, unspecified: Secondary | ICD-10-CM | POA: Diagnosis not present

## 2018-09-02 DIAGNOSIS — F419 Anxiety disorder, unspecified: Secondary | ICD-10-CM | POA: Diagnosis not present

## 2018-09-02 DIAGNOSIS — I5032 Chronic diastolic (congestive) heart failure: Secondary | ICD-10-CM | POA: Diagnosis not present

## 2018-09-02 DIAGNOSIS — I110 Hypertensive heart disease with heart failure: Secondary | ICD-10-CM | POA: Diagnosis not present

## 2018-09-02 DIAGNOSIS — F0390 Unspecified dementia without behavioral disturbance: Secondary | ICD-10-CM | POA: Diagnosis not present

## 2018-09-07 ENCOUNTER — Other Ambulatory Visit: Payer: Self-pay

## 2018-09-07 NOTE — Patient Outreach (Signed)
[name] Care Management  09/07/2018  [name] September 22, 1933 [id number]  TELEPHONE SCREENING Referral date: 09/01/18 Referral reason:  [employer] High risk  Insurance: [employer]  Telephone call to patient regarding referral.  Explained reason for call.   Patient states she does not need [facility] services at this time. She reports she has home health.   PLAN; RNCM will close patient due to refusal of services.  RNCM will send patient [facility] care management brochure / magnet RNCM will send patients primary MD closure notification   Quinn Plowman RN,BSN,CCM [facility] Telephonic  [phone]

## 2018-09-08 ENCOUNTER — Telehealth: Payer: Self-pay | Admitting: Internal Medicine

## 2018-09-08 NOTE — Telephone Encounter (Signed)
Copied from [place] [phone]. Topic: Quick Communication - Home Health Verbal Orders >> Sep 08, 2018  8:28 AM Sheran Luz wrote: Caller/Agency: [place] Number: [phone] Requesting PT for gait training, strengthening and balance Frequency: 1xw for 1w, 2xw for 3w

## 2018-09-08 NOTE — Telephone Encounter (Signed)
Left detailed message for Gerald Stabs with VO okayed for pt as requested.

## 2018-09-09 ENCOUNTER — Telehealth: Payer: Self-pay | Admitting: Internal Medicine

## 2018-09-09 NOTE — Telephone Encounter (Signed)
Copied from [place] [phone]. Topic: Quick Communication - Home Health Verbal Orders >> Sep 09, 2018  3:10 PM Hewitt Shorts wrote: Caller/Agency: maggie/[facility]  Callback Number: [phone] Requesting OT/PT/Skilled Nursing/Social Work: skilled nursing for med education can have some one out on saturday Frequency: no until the assessment

## 2018-09-10 DIAGNOSIS — F419 Anxiety disorder, unspecified: Secondary | ICD-10-CM | POA: Diagnosis not present

## 2018-09-10 DIAGNOSIS — E119 Type 2 diabetes mellitus without complications: Secondary | ICD-10-CM | POA: Diagnosis not present

## 2018-09-10 DIAGNOSIS — G8929 Other chronic pain: Secondary | ICD-10-CM | POA: Diagnosis not present

## 2018-09-10 DIAGNOSIS — F3289 Other specified depressive episodes: Secondary | ICD-10-CM | POA: Diagnosis not present

## 2018-09-10 DIAGNOSIS — M549 Dorsalgia, unspecified: Secondary | ICD-10-CM | POA: Diagnosis not present

## 2018-09-10 DIAGNOSIS — I350 Nonrheumatic aortic (valve) stenosis: Secondary | ICD-10-CM | POA: Diagnosis not present

## 2018-09-10 DIAGNOSIS — F0390 Unspecified dementia without behavioral disturbance: Secondary | ICD-10-CM | POA: Diagnosis not present

## 2018-09-10 DIAGNOSIS — I5032 Chronic diastolic (congestive) heart failure: Secondary | ICD-10-CM | POA: Diagnosis not present

## 2018-09-10 DIAGNOSIS — I110 Hypertensive heart disease with heart failure: Secondary | ICD-10-CM | POA: Diagnosis not present

## 2018-09-10 NOTE — Telephone Encounter (Signed)
Contacted Maggie at [facility] and gave VO as requested.

## 2018-09-11 DIAGNOSIS — E669 Obesity, unspecified: Secondary | ICD-10-CM

## 2018-09-11 DIAGNOSIS — E119 Type 2 diabetes mellitus without complications: Secondary | ICD-10-CM

## 2018-09-11 DIAGNOSIS — F3289 Other specified depressive episodes: Secondary | ICD-10-CM

## 2018-09-11 DIAGNOSIS — M549 Dorsalgia, unspecified: Secondary | ICD-10-CM | POA: Diagnosis not present

## 2018-09-11 DIAGNOSIS — R69 Illness, unspecified: Secondary | ICD-10-CM

## 2018-09-11 DIAGNOSIS — I350 Nonrheumatic aortic (valve) stenosis: Secondary | ICD-10-CM

## 2018-09-11 DIAGNOSIS — I5032 Chronic diastolic (congestive) heart failure: Secondary | ICD-10-CM

## 2018-09-11 DIAGNOSIS — F0390 Unspecified dementia without behavioral disturbance: Secondary | ICD-10-CM | POA: Diagnosis not present

## 2018-09-11 DIAGNOSIS — I110 Hypertensive heart disease with heart failure: Secondary | ICD-10-CM | POA: Diagnosis not present

## 2018-09-11 DIAGNOSIS — F419 Anxiety disorder, unspecified: Secondary | ICD-10-CM

## 2018-09-11 DIAGNOSIS — Z9181 History of falling: Secondary | ICD-10-CM

## 2018-09-11 DIAGNOSIS — G8929 Other chronic pain: Secondary | ICD-10-CM | POA: Diagnosis not present

## 2018-09-11 DIAGNOSIS — E049 Nontoxic goiter, unspecified: Secondary | ICD-10-CM

## 2018-09-11 DIAGNOSIS — Z6836 Body mass index (BMI) 36.0-36.9, adult: Secondary | ICD-10-CM

## 2018-09-11 DIAGNOSIS — Z96653 Presence of artificial knee joint, bilateral: Secondary | ICD-10-CM

## 2018-09-15 ENCOUNTER — Other Ambulatory Visit (INDEPENDENT_AMBULATORY_CARE_PROVIDER_SITE_OTHER): Payer: Medicare HMO

## 2018-09-15 ENCOUNTER — Telehealth: Payer: Self-pay | Admitting: Internal Medicine

## 2018-09-15 ENCOUNTER — Encounter: Payer: Self-pay | Admitting: Internal Medicine

## 2018-09-15 ENCOUNTER — Encounter

## 2018-09-15 ENCOUNTER — Ambulatory Visit (INDEPENDENT_AMBULATORY_CARE_PROVIDER_SITE_OTHER): Payer: Medicare HMO | Admitting: Internal Medicine

## 2018-09-15 VITALS — BP 140/80 | HR 60 | Temp 97.9°F | Resp 16 | Ht 68.0 in | Wt 196.5 lb

## 2018-09-15 DIAGNOSIS — I10 Essential (primary) hypertension: Secondary | ICD-10-CM | POA: Diagnosis not present

## 2018-09-15 DIAGNOSIS — E876 Hypokalemia: Secondary | ICD-10-CM | POA: Diagnosis not present

## 2018-09-15 DIAGNOSIS — M150 Primary generalized (osteo)arthritis: Secondary | ICD-10-CM

## 2018-09-15 DIAGNOSIS — Z23 Encounter for immunization: Secondary | ICD-10-CM

## 2018-09-15 DIAGNOSIS — M159 Polyosteoarthritis, unspecified: Secondary | ICD-10-CM

## 2018-09-15 LAB — MAGNESIUM: Magnesium: 1.7 mg/dL (ref 1.5–2.5)

## 2018-09-15 LAB — BASIC METABOLIC PANEL
BUN: 13 mg/dL (ref 6–23)
CALCIUM: 9.7 mg/dL (ref 8.4–10.5)
CO2: 35 meq/L — AB (ref 19–32)
Chloride: 99 mEq/L (ref 96–112)
Creatinine, Ser: 0.6 mg/dL (ref 0.40–1.20)
GFR: 122.61 mL/min (ref >60.00)
Glucose, Bld: 92 mg/dL (ref 70–99)
Potassium: 4.2 mEq/L (ref 3.5–5.1)
SODIUM: 141 meq/L (ref 135–145)

## 2018-09-15 MED ORDER — MELOXICAM 7.5 MG PO TABS: 7.5000 mg | ORAL_TABLET | Freq: Every day | ORAL | 0 refills | Status: DC

## 2018-09-15 NOTE — Patient Instructions (Signed)

## 2018-09-15 NOTE — Telephone Encounter (Signed)
Copied from [place] [phone]. Topic: Quick Communication - See Telephone Encounter >> Sep 15, 2018  1:32 PM Vernona Rieger wrote: CRM for notification. See Telephone encounter for: 09/15/18.  [name], Freight forwarder at [facility] states that nursing went out to see her over the weekend and patient declined about 30 minutes into their visit. Patient has an appointment today and wants Dr Ronnald Ramp to discuss her with her wither or not she wants a nurse or not? Please advise. Call back @ [phone]. She said she wants stronger pain meds than tylenol but doesn't want narcotic. What can she take? She states her daughter manages her medications.

## 2018-09-15 NOTE — Progress Notes (Signed)
Subjective:  Patient ID: [name], female    DOB: 06-05-1933  Age: 84 y.o. MRN: [id number]  CC: Hypertension and Osteoarthritis   HPI [name] presents for f/up - She was recently seen in the ED for an episode of confusion.  Her work-up was relatively unremarkable.  She has stopped taking oxycodone and has decreased her dose of the benzodiazepine.  She is now feeling much better.  She continues to struggle with anxiety and panic.  He is with her nephew today and he thinks she is doing pretty well.  She continues to struggle with chronic pain in her back and large joints and wants to know what she can take since she is not taking oxycodone anymore.  Outpatient Medications Prior to Visit  Medication Sig Dispense Refill  . acetaminophen (TYLENOL) 325 MG tablet Take 2 tablets (650 mg total) by mouth every 6 (six) hours as needed for moderate pain.    . calcium carbonate (OS-CAL) 600 MG TABS tablet Take 600 mg by mouth 2 (two) times daily with a meal.    . carvedilol (COREG) 6.25 MG tablet TAKE 1 TABLET BY MOUTH TWICE A DAY WITH MEALS 180 tablet 1  . chlorthalidone (HYGROTON) 25 MG tablet TAKE 1 TABLET BY MOUTH ONCE DAILY 90 tablet 0  . Cholecalciferol 2000 units TABS Take 2 tablets (4,000 Units total) by mouth daily. 180 tablet 1  . clonazePAM (KLONOPIN) 1 MG tablet Take 0.5 tablets (0.5 mg total) by mouth 2 (two) times daily as needed. for anxiety    . gabapentin (NEURONTIN) 600 MG tablet Take 0.5 tablets (300 mg total) by mouth 3 (three) times daily. 90 tablet 0  . MYRBETRIQ 25 MG TB24 tablet TAKE 1 TABLET BY MOUTH ONCE DAILY 90 tablet 1  . omeprazole (PRILOSEC) 40 MG capsule Take 1 capsule (40 mg total) by mouth daily. 90 capsule 1  . traZODone (DESYREL) 100 MG tablet Take 1 tablet (100 mg total) by mouth at bedtime.  0   No facility-administered medications prior to visit.     ROS Review of Systems  Constitutional: Negative.  Negative for diaphoresis and fatigue.  HENT:  Negative.   Eyes: Negative for visual disturbance.  Respiratory: Negative for cough, chest tightness, shortness of breath and wheezing.   Cardiovascular: Negative for chest pain, palpitations and leg swelling.  Gastrointestinal: Negative for abdominal pain, constipation, diarrhea, nausea and vomiting.  Genitourinary: Negative.  Negative for difficulty urinating.  Musculoskeletal: Positive for arthralgias. Negative for back pain.  Skin: Negative.  Negative for color change.  Neurological: Negative.  Negative for dizziness, weakness and light-headedness.  Hematological: Negative for adenopathy. Does not bruise/bleed easily.  Psychiatric/Behavioral: Positive for decreased concentration, dysphoric mood and sleep disturbance. Negative for agitation, confusion and suicidal ideas. The patient is nervous/anxious.     Objective:  BP 140/80 (BP Location: Left Arm, Patient Position: Sitting, Cuff Size: Normal)   Pulse 60   Temp 97.9 F (36.6 C) (Oral)   Resp 16   Ht 5\' 8"  (1.727 m)   Wt 196 lb 8 oz (89.1 kg)   SpO2 96%   BMI 29.88 kg/m   BP Readings from Last 3 Encounters:  09/15/18 140/80  08/28/18 (!) 173/76  07/07/18 (!) 152/78    Wt Readings from Last 3 Encounters:  09/15/18 196 lb 8 oz (89.1 kg)  08/25/18 200 lb (90.7 kg)  07/07/18 188 lb 12.8 oz (85.6 kg)    Physical Exam  Constitutional: She is oriented to  person, place, and time. No distress.  HENT:  Mouth/Throat: Oropharynx is clear and moist. No oropharyngeal exudate.  Eyes: Conjunctivae are normal. No scleral icterus.  Neck: Normal range of motion. Neck supple. No JVD present. No thyromegaly present.  Cardiovascular: Normal rate, regular rhythm and normal heart sounds. Exam reveals no gallop.  No murmur heard. Pulmonary/Chest: Effort normal and breath sounds normal. No respiratory distress. She has no wheezes. She has no rales.  Abdominal: Soft. Bowel sounds are normal. She exhibits no mass. There is no  hepatosplenomegaly. There is no tenderness.  Musculoskeletal: Normal range of motion. She exhibits no edema or deformity.  Lymphadenopathy:    She has no cervical adenopathy.  Neurological: She is alert and oriented to person, place, and time.  Skin: Skin is warm and dry. She is not diaphoretic. No pallor.  Psychiatric: Judgment and thought content normal. Her mood appears anxious. Her speech is tangential. Her speech is not rapid and/or pressured. She is not agitated, not aggressive, not withdrawn and not combative. Thought content is not paranoid. Cognition and memory are normal. She exhibits a depressed mood. She expresses no homicidal and no suicidal ideation.  Vitals reviewed.   Lab Results  Component Value Date   WBC 8.4 08/27/2018   HGB 12.6 08/27/2018   HCT 39.9 08/27/2018   PLT 144 (L) 08/27/2018   GLUCOSE 92 09/15/2018   CHOL 194 08/26/2018   TRIG 95 08/26/2018   HDL 69 08/26/2018   LDLDIRECT 121.0 06/12/2017   LDLCALC 106 (H) 08/26/2018   ALT 16 08/25/2018   AST 34 08/25/2018   NA 141 09/15/2018   K 4.2 09/15/2018   CL 99 09/15/2018   CREATININE 0.60 09/15/2018   BUN 13 09/15/2018   CO2 35 (H) 09/15/2018   TSH 2.350 08/25/2018   INR 1.03 08/25/2018   HGBA1C 5.0 08/26/2018    Ct Angio Head W Or Wo Contrast  Result Date: 08/25/2018 CLINICAL DATA:  Initial evaluation for acute altered mental status. EXAM: CT ANGIOGRAPHY HEAD AND NECK CT PERFUSION BRAIN TECHNIQUE: Multidetector CT imaging of the head and neck was performed using the standard protocol during bolus administration of intravenous contrast. Multiplanar CT image reconstructions and MIPs were obtained to evaluate the vascular anatomy. Carotid stenosis measurements (when applicable) are obtained utilizing NASCET criteria, using the distal internal carotid diameter as the denominator. Multiphase CT imaging of the brain was performed following IV bolus contrast injection. Subsequent parametric perfusion maps were  calculated using RAPID software. CONTRAST:  163mL ISOVUE-370 IOPAMIDOL (ISOVUE-370) INJECTION 76% COMPARISON:  Prior CT from earlier the same day. FINDINGS: CT HEAD FINDINGS Brain: Atrophy with chronic microvascular ischemic disease again noted, stable. Small remote right thalamic and right cerebellar infarcts noted. No evidence for acute or interval intracranial hemorrhage. No acute large vessel territory infarct. No mass lesion, midline shift or mass effect. No hydrocephalus. No extra-axial fluid collection. Vascular: No hyperdense vessel. Scattered vascular calcifications noted within the carotid siphons. Skull: Scalp soft tissues and calvarium within normal limits. Sinuses/Orbits: Right maxillary sinus retention cyst. Acute on chronic right sphenoid sinusitis. Mastoid air cells and middle ear cavities are clear. Globes and orbital soft tissues within normal limits. Other: None. CTA NECK FINDINGS Aortic arch: Visualized aortic arch of normal caliber with normal branch pattern. Incidental note made of a bovine arch with common origin of the left common and brachiocephalic arteries. Mild atherosclerotic change about the arch and origin of the great vessels without hemodynamically significant stenosis. Visualized subclavian arteries widely patent.  Right carotid system: Right common carotid artery patent from its origin to the bifurcation without stenosis. A centric calcified plaque about the proximal right ICA with associated stenosis of up to 70% by NASCET criteria. Right ICA tortuous but otherwise patent to the skull base without stenosis, dissection, or occlusion Left carotid system: Left common carotid artery tortuous proximally but widely patent to the bifurcation. Scattered calcified plaque about the proximal left ICA with associated stenosis of up to 75-80% by NASCET criteria. Left ICA tortuous but otherwise patent to the skull base. Both internal carotid arteries medialized into the retropharyngeal space.  Vertebral arteries: Both of the vertebral arteries arise from the subclavian arteries. Left vertebral artery dominant. Mild plaque at the origin of the vertebral arteries bilaterally without significant stenosis. Vertebral arteries widely patent within the neck without stenosis, dissection, or occlusion. Skeleton: No acute osseous abnormality. No discrete osseous lesions. Prominent osteoarthritic changes about the C1-2 articulation. Advanced right-sided facet arthrosis at C2-3. Other neck: Thyroid diffuse lead enlarged and heterogeneous in appearance, likely related to goiter. Soft tissues of the neck demonstrate no acute finding. Upper chest: Visualized upper chest and mediastinum within normal limits. Small focus of tree-in-bud nodular opacity at the posterior left upper lobe (series 11, image 159), consistent with mild small airways disease. Review of the MIP images confirms the above findings CTA HEAD FINDINGS Anterior circulation: Petrous segments widely patent bilaterally. Calcified plaque within the cavernous/supraclinoid ICAs with resultant mild to moderate multifocal narrowing, right slightly worse than left. ICA termini widely patent. A1 segments patent bilaterally. Normal anterior communicating artery. Anterior cerebral arteries patent to their distal aspects without stenosis. No M1 stenosis or occlusion. No proximal M2 occlusion. Distal MCA branches well perfused and symmetric. Posterior circulation: Focal non stenotic plaque noted within the dominant left V4 segment. Left vertebral artery otherwise widely patent to the vertebrobasilar junction. Right vertebral artery diminutive but widely patent as well. Posterior inferior cerebral arteries patent bilaterally. Basilar tortuous but patent to its distal aspect without high-grade stenosis. Superior cerebral arteries patent bilaterally. Right PCA supplied via the basilar. Fetal type origin of the left PCA. Single short-segment moderate left P2 stenosis  noted. PCAs otherwise widely patent to their distal aspects. Venous sinuses: Not well assessed due to arterial timing of the contrast bolus, but grossly patent. Anatomic variants: Fetal type left PCA.  No intracranial aneurysm. Delayed phase: Not performed. Review of the MIP images confirms the above findings CT Brain Perfusion Findings: CBF (<30%) Volume: 25mL Perfusion (Tmax>6.0s) volume: 40mL Mismatch Volume: 57mL Infarction Location:Negative. IMPRESSION: 1. Negative CTA with no large vessel occlusion identified. 2. Negative CT perfusion with no evidence for core infarct or perfusion abnormality. 3. Atheromatous stenoses about the carotid bifurcations bilaterally, measuring up to 70% on the right and 75-80% on the left. 4. Additional mild scattered atherosclerotic change elsewhere within the major arterial vasculature of the head and neck as above. No other high-grade or correctable stenosis. 5. Thyroid goiter. Electronically Signed   By: Jeannine Boga M.D.   On: 08/25/2018 20:03   Ct Head Wo Contrast  Result Date: 08/25/2018 CLINICAL DATA:  Altered mental status 2 days. EXAM: CT HEAD WITHOUT CONTRAST TECHNIQUE: Contiguous axial images were obtained from the base of the skull through the vertex without intravenous contrast. COMPARISON:  08/05/2017 and 10/26/2013 FINDINGS: Brain: Ventricles, cisterns and other CSF spaces are within normal. There is mild chronic ischemic microvascular disease. Tiny old right thalamic lacunar infarct. No mass, mass effect, shift of midline structures or acute  hemorrhage. No evidence of acute infarction. Vascular: No hyperdense vessel or unexpected calcification. Skull: Well-defined sclerotic focus over the left frontal skull unchanged. Sinuses/Orbits: No acute finding. Other: None. IMPRESSION: No acute findings. Mild chronic ischemic microvascular disease. Small old thalamic lacunar infarct. Electronically Signed   By: Marin Olp M.D.   On: 08/25/2018 16:09   Ct Angio  Neck W Or Wo Contrast  Result Date: 08/25/2018 CLINICAL DATA:  Initial evaluation for acute altered mental status. EXAM: CT ANGIOGRAPHY HEAD AND NECK CT PERFUSION BRAIN TECHNIQUE: Multidetector CT imaging of the head and neck was performed using the standard protocol during bolus administration of intravenous contrast. Multiplanar CT image reconstructions and MIPs were obtained to evaluate the vascular anatomy. Carotid stenosis measurements (when applicable) are obtained utilizing NASCET criteria, using the distal internal carotid diameter as the denominator. Multiphase CT imaging of the brain was performed following IV bolus contrast injection. Subsequent parametric perfusion maps were calculated using RAPID software. CONTRAST:  172mL ISOVUE-370 IOPAMIDOL (ISOVUE-370) INJECTION 76% COMPARISON:  Prior CT from earlier the same day. FINDINGS: CT HEAD FINDINGS Brain: Atrophy with chronic microvascular ischemic disease again noted, stable. Small remote right thalamic and right cerebellar infarcts noted. No evidence for acute or interval intracranial hemorrhage. No acute large vessel territory infarct. No mass lesion, midline shift or mass effect. No hydrocephalus. No extra-axial fluid collection. Vascular: No hyperdense vessel. Scattered vascular calcifications noted within the carotid siphons. Skull: Scalp soft tissues and calvarium within normal limits. Sinuses/Orbits: Right maxillary sinus retention cyst. Acute on chronic right sphenoid sinusitis. Mastoid air cells and middle ear cavities are clear. Globes and orbital soft tissues within normal limits. Other: None. CTA NECK FINDINGS Aortic arch: Visualized aortic arch of normal caliber with normal branch pattern. Incidental note made of a bovine arch with common origin of the left common and brachiocephalic arteries. Mild atherosclerotic change about the arch and origin of the great vessels without hemodynamically significant stenosis. Visualized subclavian  arteries widely patent. Right carotid system: Right common carotid artery patent from its origin to the bifurcation without stenosis. A centric calcified plaque about the proximal right ICA with associated stenosis of up to 70% by NASCET criteria. Right ICA tortuous but otherwise patent to the skull base without stenosis, dissection, or occlusion Left carotid system: Left common carotid artery tortuous proximally but widely patent to the bifurcation. Scattered calcified plaque about the proximal left ICA with associated stenosis of up to 75-80% by NASCET criteria. Left ICA tortuous but otherwise patent to the skull base. Both internal carotid arteries medialized into the retropharyngeal space. Vertebral arteries: Both of the vertebral arteries arise from the subclavian arteries. Left vertebral artery dominant. Mild plaque at the origin of the vertebral arteries bilaterally without significant stenosis. Vertebral arteries widely patent within the neck without stenosis, dissection, or occlusion. Skeleton: No acute osseous abnormality. No discrete osseous lesions. Prominent osteoarthritic changes about the C1-2 articulation. Advanced right-sided facet arthrosis at C2-3. Other neck: Thyroid diffuse lead enlarged and heterogeneous in appearance, likely related to goiter. Soft tissues of the neck demonstrate no acute finding. Upper chest: Visualized upper chest and mediastinum within normal limits. Small focus of tree-in-bud nodular opacity at the posterior left upper lobe (series 11, image 159), consistent with mild small airways disease. Review of the MIP images confirms the above findings CTA HEAD FINDINGS Anterior circulation: Petrous segments widely patent bilaterally. Calcified plaque within the cavernous/supraclinoid ICAs with resultant mild to moderate multifocal narrowing, right slightly worse than left. ICA termini widely patent.  A1 segments patent bilaterally. Normal anterior communicating artery. Anterior  cerebral arteries patent to their distal aspects without stenosis. No M1 stenosis or occlusion. No proximal M2 occlusion. Distal MCA branches well perfused and symmetric. Posterior circulation: Focal non stenotic plaque noted within the dominant left V4 segment. Left vertebral artery otherwise widely patent to the vertebrobasilar junction. Right vertebral artery diminutive but widely patent as well. Posterior inferior cerebral arteries patent bilaterally. Basilar tortuous but patent to its distal aspect without high-grade stenosis. Superior cerebral arteries patent bilaterally. Right PCA supplied via the basilar. Fetal type origin of the left PCA. Single short-segment moderate left P2 stenosis noted. PCAs otherwise widely patent to their distal aspects. Venous sinuses: Not well assessed due to arterial timing of the contrast bolus, but grossly patent. Anatomic variants: Fetal type left PCA.  No intracranial aneurysm. Delayed phase: Not performed. Review of the MIP images confirms the above findings CT Brain Perfusion Findings: CBF (<30%) Volume: 76mL Perfusion (Tmax>6.0s) volume: 98mL Mismatch Volume: 14mL Infarction Location:Negative. IMPRESSION: 1. Negative CTA with no large vessel occlusion identified. 2. Negative CT perfusion with no evidence for core infarct or perfusion abnormality. 3. Atheromatous stenoses about the carotid bifurcations bilaterally, measuring up to 70% on the right and 75-80% on the left. 4. Additional mild scattered atherosclerotic change elsewhere within the major arterial vasculature of the head and neck as above. No other high-grade or correctable stenosis. 5. Thyroid goiter. Electronically Signed   By: Jeannine Boga M.D.   On: 08/25/2018 20:03   Mr Brain Wo Contrast  Result Date: 08/26/2018 CLINICAL DATA:  Altered mental status. New onset seizures. History of hypertension, hyperlipidemia, diabetes. EXAM: MRI HEAD WITHOUT CONTRAST TECHNIQUE: Multiplanar, multiecho pulse sequences  of the brain and surrounding structures were obtained without intravenous contrast. COMPARISON:  CT HEAD August 25, 2018 FINDINGS: Moderately motion degraded examination. INTRACRANIAL CONTENTS: No reduced diffusion to suggest acute ischemia. No susceptibility artifact to suggest hemorrhage. The ventricles and sulci are normal for patient's age. Patchy supratentorial white matter FLAIR T2 hyperintensities. Old small bilateral cerebellar infarcts. Old RIGHT thalamus lacunar infarct. No suspicious parenchymal signal, masses, mass effect. No abnormal extra-axial fluid collections. No extra-axial masses. Nondiagnostic thin slice coronal imaging of the hippocampi. VASCULAR: Normal major intracranial vascular flow voids present at skull base. SKULL AND UPPER CERVICAL SPINE: Mild empty sella. No suspicious calvarial bone marrow signal. Craniocervical junction maintained. SINUSES/ORBITS: The mastoid air-cells and included paranasal sinuses are well-aerated.The included ocular globes and orbital contents are non-suspicious. Status post bilateral ocular lens implants. OTHER: Patient is edentulous. IMPRESSION: 1. No acute intracranial process on this motion degraded examination. 2. Old small RIGHT thalamus and bilateral cerebellar infarcts. 3. Moderate chronic small vessel ischemic changes. Electronically Signed   By: Elon Alas M.D.   On: 08/26/2018 22:39   Ct Abdomen Pelvis W Contrast  Result Date: 08/25/2018 CLINICAL DATA:  Acute abdominal pain EXAM: CT ABDOMEN AND PELVIS WITH CONTRAST TECHNIQUE: Multidetector CT imaging of the abdomen and pelvis was performed using the standard protocol following bolus administration of intravenous contrast. CONTRAST:  130mL ISOVUE-370 IOPAMIDOL (ISOVUE-370) INJECTION 76% COMPARISON:  CT 07/07/2018, 05/20/2014 FINDINGS: Lower chest: Lung bases demonstrate no acute consolidation or effusion. Heart size upper limits of normal Hepatobiliary: Status post cholecystectomy. No focal  hepatic abnormality. Stable slight enlargement of the extrahepatic bile duct. Pancreas: Unremarkable. No pancreatic ductal dilatation or surrounding inflammatory changes. Spleen: Normal in size without focal abnormality. Adrenals/Urinary Tract: Adrenal glands are stable. 2.7 cm right adrenal mass. Left adrenal gland  is normal. No hydronephrosis. Subcentimeter hypodense renal lesions too small to further characterize. Bladder normal Stomach/Bowel: Stomach is nonenlarged. No dilated small bowel. No colon wall thickening. Sigmoid colon diverticula without acute inflammatory process. Vascular/Lymphatic: Nonaneurysmal aorta. Extensive aortic atherosclerosis. No significantly enlarged lymph nodes. Reproductive: Status post hysterectomy. No adnexal masses. Other: Negative for free air or free fluid. Fat within the bilateral inguinal regions. Musculoskeletal: Degenerative changes of the spine. No acute or suspicious abnormality. IMPRESSION: 1. No CT evidence for acute intra-abdominal or pelvic abnormality. 2. Status post cholecystectomy. Slight prominence of the extrahepatic bile duct but without interval increase in dilatation. No intra hepatic biliary dilatation is visualized. 3. Stable 2.7 cm right adrenal gland adenoma 4. Sigmoid colon diverticular disease without acute inflammatory process. Electronically Signed   By: Donavan Foil M.D.   On: 08/25/2018 19:18   Ct Cerebral Perfusion W Contrast  Result Date: 08/25/2018 CLINICAL DATA:  Initial evaluation for acute altered mental status. EXAM: CT ANGIOGRAPHY HEAD AND NECK CT PERFUSION BRAIN TECHNIQUE: Multidetector CT imaging of the head and neck was performed using the standard protocol during bolus administration of intravenous contrast. Multiplanar CT image reconstructions and MIPs were obtained to evaluate the vascular anatomy. Carotid stenosis measurements (when applicable) are obtained utilizing NASCET criteria, using the distal internal carotid diameter as the  denominator. Multiphase CT imaging of the brain was performed following IV bolus contrast injection. Subsequent parametric perfusion maps were calculated using RAPID software. CONTRAST:  178mL ISOVUE-370 IOPAMIDOL (ISOVUE-370) INJECTION 76% COMPARISON:  Prior CT from earlier the same day. FINDINGS: CT HEAD FINDINGS Brain: Atrophy with chronic microvascular ischemic disease again noted, stable. Small remote right thalamic and right cerebellar infarcts noted. No evidence for acute or interval intracranial hemorrhage. No acute large vessel territory infarct. No mass lesion, midline shift or mass effect. No hydrocephalus. No extra-axial fluid collection. Vascular: No hyperdense vessel. Scattered vascular calcifications noted within the carotid siphons. Skull: Scalp soft tissues and calvarium within normal limits. Sinuses/Orbits: Right maxillary sinus retention cyst. Acute on chronic right sphenoid sinusitis. Mastoid air cells and middle ear cavities are clear. Globes and orbital soft tissues within normal limits. Other: None. CTA NECK FINDINGS Aortic arch: Visualized aortic arch of normal caliber with normal branch pattern. Incidental note made of a bovine arch with common origin of the left common and brachiocephalic arteries. Mild atherosclerotic change about the arch and origin of the great vessels without hemodynamically significant stenosis. Visualized subclavian arteries widely patent. Right carotid system: Right common carotid artery patent from its origin to the bifurcation without stenosis. A centric calcified plaque about the proximal right ICA with associated stenosis of up to 70% by NASCET criteria. Right ICA tortuous but otherwise patent to the skull base without stenosis, dissection, or occlusion Left carotid system: Left common carotid artery tortuous proximally but widely patent to the bifurcation. Scattered calcified plaque about the proximal left ICA with associated stenosis of up to 75-80% by NASCET  criteria. Left ICA tortuous but otherwise patent to the skull base. Both internal carotid arteries medialized into the retropharyngeal space. Vertebral arteries: Both of the vertebral arteries arise from the subclavian arteries. Left vertebral artery dominant. Mild plaque at the origin of the vertebral arteries bilaterally without significant stenosis. Vertebral arteries widely patent within the neck without stenosis, dissection, or occlusion. Skeleton: No acute osseous abnormality. No discrete osseous lesions. Prominent osteoarthritic changes about the C1-2 articulation. Advanced right-sided facet arthrosis at C2-3. Other neck: Thyroid diffuse lead enlarged and heterogeneous in appearance, likely related  to goiter. Soft tissues of the neck demonstrate no acute finding. Upper chest: Visualized upper chest and mediastinum within normal limits. Small focus of tree-in-bud nodular opacity at the posterior left upper lobe (series 11, image 159), consistent with mild small airways disease. Review of the MIP images confirms the above findings CTA HEAD FINDINGS Anterior circulation: Petrous segments widely patent bilaterally. Calcified plaque within the cavernous/supraclinoid ICAs with resultant mild to moderate multifocal narrowing, right slightly worse than left. ICA termini widely patent. A1 segments patent bilaterally. Normal anterior communicating artery. Anterior cerebral arteries patent to their distal aspects without stenosis. No M1 stenosis or occlusion. No proximal M2 occlusion. Distal MCA branches well perfused and symmetric. Posterior circulation: Focal non stenotic plaque noted within the dominant left V4 segment. Left vertebral artery otherwise widely patent to the vertebrobasilar junction. Right vertebral artery diminutive but widely patent as well. Posterior inferior cerebral arteries patent bilaterally. Basilar tortuous but patent to its distal aspect without high-grade stenosis. Superior cerebral arteries  patent bilaterally. Right PCA supplied via the basilar. Fetal type origin of the left PCA. Single short-segment moderate left P2 stenosis noted. PCAs otherwise widely patent to their distal aspects. Venous sinuses: Not well assessed due to arterial timing of the contrast bolus, but grossly patent. Anatomic variants: Fetal type left PCA.  No intracranial aneurysm. Delayed phase: Not performed. Review of the MIP images confirms the above findings CT Brain Perfusion Findings: CBF (<30%) Volume: 4mL Perfusion (Tmax>6.0s) volume: 82mL Mismatch Volume: 76mL Infarction Location:Negative. IMPRESSION: 1. Negative CTA with no large vessel occlusion identified. 2. Negative CT perfusion with no evidence for core infarct or perfusion abnormality. 3. Atheromatous stenoses about the carotid bifurcations bilaterally, measuring up to 70% on the right and 75-80% on the left. 4. Additional mild scattered atherosclerotic change elsewhere within the major arterial vasculature of the head and neck as above. No other high-grade or correctable stenosis. 5. Thyroid goiter. Electronically Signed   By: Jeannine Boga M.D.   On: 08/25/2018 20:03   Dg Chest Port 1 View  Result Date: 08/25/2018 CLINICAL DATA:  History of CHF.  Altered mental status. EXAM: PORTABLE CHEST 1 VIEW COMPARISON:  11/02/2017.  03/10/2015.  CT 05/19/2014. FINDINGS: Stable prominence of the superior mediastinum consistent with substernal goiter as previously noted on CT of 05/19/2014. Heart size stable. Low lung volumes mild basilar atelectasis. No focal alveolar infiltrate. No pleural effusion or pneumothorax. IMPRESSION: 1.  Substernal goiter. 2. Low lung volumes with mild basilar atelectasis. Exam otherwise unremarkable. Electronically Signed   By: Marcello Moores  Register   On: 08/25/2018 15:04    Assessment & Plan:   Levetta was seen today for hypertension and osteoarthritis.  Diagnoses and all orders for this visit:  Essential hypertension- Her BP is well  controlled.  Electrolytes and renal function are normal. -     Basic metabolic panel; Future  Hypokalemia- Her potassium level is normal now.  Will continue the current potassium supplement. -     Basic metabolic panel; Future -     Magnesium; Future  Hypomagnesemia -     Basic metabolic panel; Future -     Magnesium; Future  Primary osteoarthritis involving multiple joints -     meloxicam (MOBIC) 7.5 MG tablet; Take 1 tablet (7.5 mg total) by mouth daily.  Need for influenza vaccination -     Flu vaccine HIGH DOSE PF (Fluzone High dose)  Need for pneumococcal vaccination -     Pneumococcal polysaccharide vaccine 23-valent greater than or equal  to 2yo subcutaneous/IM   I am having Alandria H. Imhof start on meloxicam. I am also having her maintain her calcium carbonate, omeprazole, Cholecalciferol, MYRBETRIQ, chlorthalidone, carvedilol, traZODone, clonazePAM, gabapentin, and acetaminophen.  Meds ordered this encounter  Medications  . meloxicam (MOBIC) 7.5 MG tablet    Sig: Take 1 tablet (7.5 mg total) by mouth daily.    Dispense:  90 tablet    Refill:  0     Follow-up: Return in about 6 months (around 03/17/2019).  Scarlette Calico, MD

## 2018-09-17 DIAGNOSIS — E119 Type 2 diabetes mellitus without complications: Secondary | ICD-10-CM | POA: Diagnosis not present

## 2018-09-17 DIAGNOSIS — I5032 Chronic diastolic (congestive) heart failure: Secondary | ICD-10-CM | POA: Diagnosis not present

## 2018-09-17 DIAGNOSIS — F3289 Other specified depressive episodes: Secondary | ICD-10-CM | POA: Diagnosis not present

## 2018-09-17 DIAGNOSIS — I110 Hypertensive heart disease with heart failure: Secondary | ICD-10-CM | POA: Diagnosis not present

## 2018-09-17 DIAGNOSIS — F0390 Unspecified dementia without behavioral disturbance: Secondary | ICD-10-CM | POA: Diagnosis not present

## 2018-09-17 DIAGNOSIS — M549 Dorsalgia, unspecified: Secondary | ICD-10-CM | POA: Diagnosis not present

## 2018-09-17 DIAGNOSIS — F419 Anxiety disorder, unspecified: Secondary | ICD-10-CM | POA: Diagnosis not present

## 2018-09-17 DIAGNOSIS — I350 Nonrheumatic aortic (valve) stenosis: Secondary | ICD-10-CM | POA: Diagnosis not present

## 2018-09-17 DIAGNOSIS — G8929 Other chronic pain: Secondary | ICD-10-CM | POA: Diagnosis not present

## 2018-09-18 DIAGNOSIS — G8929 Other chronic pain: Secondary | ICD-10-CM | POA: Diagnosis not present

## 2018-09-18 DIAGNOSIS — I350 Nonrheumatic aortic (valve) stenosis: Secondary | ICD-10-CM | POA: Diagnosis not present

## 2018-09-18 DIAGNOSIS — E119 Type 2 diabetes mellitus without complications: Secondary | ICD-10-CM | POA: Diagnosis not present

## 2018-09-18 DIAGNOSIS — F0390 Unspecified dementia without behavioral disturbance: Secondary | ICD-10-CM | POA: Diagnosis not present

## 2018-09-18 DIAGNOSIS — I110 Hypertensive heart disease with heart failure: Secondary | ICD-10-CM | POA: Diagnosis not present

## 2018-09-18 DIAGNOSIS — I5032 Chronic diastolic (congestive) heart failure: Secondary | ICD-10-CM | POA: Diagnosis not present

## 2018-09-18 DIAGNOSIS — M549 Dorsalgia, unspecified: Secondary | ICD-10-CM | POA: Diagnosis not present

## 2018-09-18 DIAGNOSIS — F419 Anxiety disorder, unspecified: Secondary | ICD-10-CM | POA: Diagnosis not present

## 2018-09-18 DIAGNOSIS — F3289 Other specified depressive episodes: Secondary | ICD-10-CM | POA: Diagnosis not present

## 2018-09-21 ENCOUNTER — Telehealth: Payer: Self-pay | Admitting: Internal Medicine

## 2018-09-21 DIAGNOSIS — M549 Dorsalgia, unspecified: Secondary | ICD-10-CM | POA: Diagnosis not present

## 2018-09-21 DIAGNOSIS — I5032 Chronic diastolic (congestive) heart failure: Secondary | ICD-10-CM | POA: Diagnosis not present

## 2018-09-21 DIAGNOSIS — F419 Anxiety disorder, unspecified: Secondary | ICD-10-CM | POA: Diagnosis not present

## 2018-09-21 DIAGNOSIS — F3289 Other specified depressive episodes: Secondary | ICD-10-CM | POA: Diagnosis not present

## 2018-09-21 DIAGNOSIS — I350 Nonrheumatic aortic (valve) stenosis: Secondary | ICD-10-CM | POA: Diagnosis not present

## 2018-09-21 DIAGNOSIS — E119 Type 2 diabetes mellitus without complications: Secondary | ICD-10-CM | POA: Diagnosis not present

## 2018-09-21 DIAGNOSIS — I110 Hypertensive heart disease with heart failure: Secondary | ICD-10-CM | POA: Diagnosis not present

## 2018-09-21 DIAGNOSIS — G8929 Other chronic pain: Secondary | ICD-10-CM | POA: Diagnosis not present

## 2018-09-21 DIAGNOSIS — F0390 Unspecified dementia without behavioral disturbance: Secondary | ICD-10-CM | POA: Diagnosis not present

## 2018-09-21 NOTE — Telephone Encounter (Signed)
Copied from [place] [phone]. Topic: Quick Communication - Home Health Verbal Orders >> Sep 21, 2018  3:09 PM Yvette Rack wrote: Caller/Agency: Joycelyn Schmid / Manager at [place] [facility] Number: [phone] Requesting OT/PT/Skilled Nursing/Social Work: skilled nursing evaluation Frequency: to be determined after evaluation

## 2018-09-22 NOTE — Telephone Encounter (Signed)
OK with me.

## 2018-09-22 NOTE — Telephone Encounter (Signed)
Notified [name] w/MD response.Marland KitchenJohny Chess

## 2018-09-24 DIAGNOSIS — M549 Dorsalgia, unspecified: Secondary | ICD-10-CM | POA: Diagnosis not present

## 2018-09-24 DIAGNOSIS — I350 Nonrheumatic aortic (valve) stenosis: Secondary | ICD-10-CM | POA: Diagnosis not present

## 2018-09-24 DIAGNOSIS — I110 Hypertensive heart disease with heart failure: Secondary | ICD-10-CM | POA: Diagnosis not present

## 2018-09-24 DIAGNOSIS — F3289 Other specified depressive episodes: Secondary | ICD-10-CM | POA: Diagnosis not present

## 2018-09-24 DIAGNOSIS — I5032 Chronic diastolic (congestive) heart failure: Secondary | ICD-10-CM | POA: Diagnosis not present

## 2018-09-24 DIAGNOSIS — F0390 Unspecified dementia without behavioral disturbance: Secondary | ICD-10-CM | POA: Diagnosis not present

## 2018-09-24 DIAGNOSIS — E119 Type 2 diabetes mellitus without complications: Secondary | ICD-10-CM | POA: Diagnosis not present

## 2018-09-24 DIAGNOSIS — F419 Anxiety disorder, unspecified: Secondary | ICD-10-CM | POA: Diagnosis not present

## 2018-09-24 DIAGNOSIS — G8929 Other chronic pain: Secondary | ICD-10-CM | POA: Diagnosis not present

## 2018-09-26 DIAGNOSIS — G8929 Other chronic pain: Secondary | ICD-10-CM | POA: Diagnosis not present

## 2018-09-26 DIAGNOSIS — I5032 Chronic diastolic (congestive) heart failure: Secondary | ICD-10-CM | POA: Diagnosis not present

## 2018-09-26 DIAGNOSIS — M549 Dorsalgia, unspecified: Secondary | ICD-10-CM | POA: Diagnosis not present

## 2018-09-26 DIAGNOSIS — I110 Hypertensive heart disease with heart failure: Secondary | ICD-10-CM | POA: Diagnosis not present

## 2018-09-26 DIAGNOSIS — F419 Anxiety disorder, unspecified: Secondary | ICD-10-CM | POA: Diagnosis not present

## 2018-09-26 DIAGNOSIS — F0390 Unspecified dementia without behavioral disturbance: Secondary | ICD-10-CM | POA: Diagnosis not present

## 2018-09-26 DIAGNOSIS — I350 Nonrheumatic aortic (valve) stenosis: Secondary | ICD-10-CM | POA: Diagnosis not present

## 2018-09-26 DIAGNOSIS — F3289 Other specified depressive episodes: Secondary | ICD-10-CM | POA: Diagnosis not present

## 2018-09-26 DIAGNOSIS — E119 Type 2 diabetes mellitus without complications: Secondary | ICD-10-CM | POA: Diagnosis not present

## 2018-09-28 ENCOUNTER — Telehealth: Payer: Self-pay | Admitting: Internal Medicine

## 2018-09-28 NOTE — Telephone Encounter (Signed)
Ok with me 

## 2018-09-28 NOTE — Telephone Encounter (Signed)
Called [name] no answer LMOM w/MD response.Marland KitchenJohny [name]

## 2018-09-28 NOTE — Telephone Encounter (Signed)
Copied from [place] [phone]. Topic: Quick Communication - See Telephone Encounter >> Sep 28, 2018  9:35 AM Antonieta Iba C wrote: CRM for notification. See Telephone encounter for: 09/28/18.  Olin Hauser- Pain Management  w/ [facility] is calling in to request vo.   Frequency: 1 week 1; 2 week 4 and 2 PRN visits   CB: [phone]- Okay to speak with [place]

## 2018-09-30 DIAGNOSIS — I110 Hypertensive heart disease with heart failure: Secondary | ICD-10-CM | POA: Diagnosis not present

## 2018-09-30 DIAGNOSIS — F3289 Other specified depressive episodes: Secondary | ICD-10-CM | POA: Diagnosis not present

## 2018-09-30 DIAGNOSIS — M549 Dorsalgia, unspecified: Secondary | ICD-10-CM | POA: Diagnosis not present

## 2018-09-30 DIAGNOSIS — F419 Anxiety disorder, unspecified: Secondary | ICD-10-CM | POA: Diagnosis not present

## 2018-09-30 DIAGNOSIS — I5032 Chronic diastolic (congestive) heart failure: Secondary | ICD-10-CM | POA: Diagnosis not present

## 2018-09-30 DIAGNOSIS — E119 Type 2 diabetes mellitus without complications: Secondary | ICD-10-CM | POA: Diagnosis not present

## 2018-09-30 DIAGNOSIS — I350 Nonrheumatic aortic (valve) stenosis: Secondary | ICD-10-CM | POA: Diagnosis not present

## 2018-09-30 DIAGNOSIS — G8929 Other chronic pain: Secondary | ICD-10-CM | POA: Diagnosis not present

## 2018-09-30 DIAGNOSIS — F0390 Unspecified dementia without behavioral disturbance: Secondary | ICD-10-CM | POA: Diagnosis not present

## 2018-10-02 DIAGNOSIS — F0390 Unspecified dementia without behavioral disturbance: Secondary | ICD-10-CM | POA: Diagnosis not present

## 2018-10-02 DIAGNOSIS — F419 Anxiety disorder, unspecified: Secondary | ICD-10-CM | POA: Diagnosis not present

## 2018-10-02 DIAGNOSIS — E119 Type 2 diabetes mellitus without complications: Secondary | ICD-10-CM | POA: Diagnosis not present

## 2018-10-02 DIAGNOSIS — I350 Nonrheumatic aortic (valve) stenosis: Secondary | ICD-10-CM | POA: Diagnosis not present

## 2018-10-02 DIAGNOSIS — F3289 Other specified depressive episodes: Secondary | ICD-10-CM | POA: Diagnosis not present

## 2018-10-02 DIAGNOSIS — G8929 Other chronic pain: Secondary | ICD-10-CM | POA: Diagnosis not present

## 2018-10-02 DIAGNOSIS — M549 Dorsalgia, unspecified: Secondary | ICD-10-CM | POA: Diagnosis not present

## 2018-10-02 DIAGNOSIS — I110 Hypertensive heart disease with heart failure: Secondary | ICD-10-CM | POA: Diagnosis not present

## 2018-10-02 DIAGNOSIS — I5032 Chronic diastolic (congestive) heart failure: Secondary | ICD-10-CM | POA: Diagnosis not present

## 2018-10-06 ENCOUNTER — Telehealth: Payer: Self-pay | Admitting: Internal Medicine

## 2018-10-06 DIAGNOSIS — I5032 Chronic diastolic (congestive) heart failure: Secondary | ICD-10-CM | POA: Diagnosis not present

## 2018-10-06 DIAGNOSIS — M549 Dorsalgia, unspecified: Secondary | ICD-10-CM | POA: Diagnosis not present

## 2018-10-06 DIAGNOSIS — I110 Hypertensive heart disease with heart failure: Secondary | ICD-10-CM | POA: Diagnosis not present

## 2018-10-06 DIAGNOSIS — F3289 Other specified depressive episodes: Secondary | ICD-10-CM | POA: Diagnosis not present

## 2018-10-06 DIAGNOSIS — F419 Anxiety disorder, unspecified: Secondary | ICD-10-CM | POA: Diagnosis not present

## 2018-10-06 DIAGNOSIS — G8929 Other chronic pain: Secondary | ICD-10-CM | POA: Diagnosis not present

## 2018-10-06 DIAGNOSIS — E119 Type 2 diabetes mellitus without complications: Secondary | ICD-10-CM | POA: Diagnosis not present

## 2018-10-06 DIAGNOSIS — F0390 Unspecified dementia without behavioral disturbance: Secondary | ICD-10-CM | POA: Diagnosis not present

## 2018-10-06 DIAGNOSIS — I350 Nonrheumatic aortic (valve) stenosis: Secondary | ICD-10-CM | POA: Diagnosis not present

## 2018-10-06 NOTE — Telephone Encounter (Signed)
Copied from [place] [id number]. Topic: Quick Communication - Home Health Verbal Orders >> Oct 06, 2018  4:46 PM Margot Ables wrote: Caller/Agency: Nicki Reaper RN with [facility] Number: [phone], secure VM if not available Requesting OT/PT/Skilled Nursing/Social Work: notification   Frequency: pt reported when taking neurotin left arm goes numb 30 min-1 hr each time she takes a dose and is taking 3/day.

## 2018-10-07 ENCOUNTER — Other Ambulatory Visit: Payer: Self-pay | Admitting: Internal Medicine

## 2018-10-07 DIAGNOSIS — I10 Essential (primary) hypertension: Secondary | ICD-10-CM

## 2018-10-07 NOTE — Telephone Encounter (Signed)
Called Scott RN to confirm message that was sent. He stated that pt is experiencing numbness in her left arm after taking gabapentin. Pt is taking the medication 3 times per day. Is there any change or advise to give to pt?

## 2018-10-08 DIAGNOSIS — I350 Nonrheumatic aortic (valve) stenosis: Secondary | ICD-10-CM | POA: Diagnosis not present

## 2018-10-08 DIAGNOSIS — F0390 Unspecified dementia without behavioral disturbance: Secondary | ICD-10-CM | POA: Diagnosis not present

## 2018-10-08 DIAGNOSIS — I110 Hypertensive heart disease with heart failure: Secondary | ICD-10-CM | POA: Diagnosis not present

## 2018-10-08 DIAGNOSIS — G8929 Other chronic pain: Secondary | ICD-10-CM | POA: Diagnosis not present

## 2018-10-08 DIAGNOSIS — F3289 Other specified depressive episodes: Secondary | ICD-10-CM | POA: Diagnosis not present

## 2018-10-08 DIAGNOSIS — F419 Anxiety disorder, unspecified: Secondary | ICD-10-CM | POA: Diagnosis not present

## 2018-10-08 DIAGNOSIS — E119 Type 2 diabetes mellitus without complications: Secondary | ICD-10-CM | POA: Diagnosis not present

## 2018-10-08 DIAGNOSIS — I5032 Chronic diastolic (congestive) heart failure: Secondary | ICD-10-CM | POA: Diagnosis not present

## 2018-10-08 DIAGNOSIS — M549 Dorsalgia, unspecified: Secondary | ICD-10-CM | POA: Diagnosis not present

## 2018-10-13 DIAGNOSIS — I110 Hypertensive heart disease with heart failure: Secondary | ICD-10-CM | POA: Diagnosis not present

## 2018-10-13 DIAGNOSIS — F419 Anxiety disorder, unspecified: Secondary | ICD-10-CM | POA: Diagnosis not present

## 2018-10-13 DIAGNOSIS — F0390 Unspecified dementia without behavioral disturbance: Secondary | ICD-10-CM | POA: Diagnosis not present

## 2018-10-13 DIAGNOSIS — F3289 Other specified depressive episodes: Secondary | ICD-10-CM | POA: Diagnosis not present

## 2018-10-13 DIAGNOSIS — I5032 Chronic diastolic (congestive) heart failure: Secondary | ICD-10-CM | POA: Diagnosis not present

## 2018-10-13 DIAGNOSIS — G8929 Other chronic pain: Secondary | ICD-10-CM | POA: Diagnosis not present

## 2018-10-13 DIAGNOSIS — E119 Type 2 diabetes mellitus without complications: Secondary | ICD-10-CM | POA: Diagnosis not present

## 2018-10-13 DIAGNOSIS — M549 Dorsalgia, unspecified: Secondary | ICD-10-CM | POA: Diagnosis not present

## 2018-10-13 DIAGNOSIS — I350 Nonrheumatic aortic (valve) stenosis: Secondary | ICD-10-CM | POA: Diagnosis not present

## 2018-10-15 DIAGNOSIS — I350 Nonrheumatic aortic (valve) stenosis: Secondary | ICD-10-CM | POA: Diagnosis not present

## 2018-10-15 DIAGNOSIS — E119 Type 2 diabetes mellitus without complications: Secondary | ICD-10-CM | POA: Diagnosis not present

## 2018-10-15 DIAGNOSIS — I5032 Chronic diastolic (congestive) heart failure: Secondary | ICD-10-CM | POA: Diagnosis not present

## 2018-10-15 DIAGNOSIS — F419 Anxiety disorder, unspecified: Secondary | ICD-10-CM | POA: Diagnosis not present

## 2018-10-15 DIAGNOSIS — M549 Dorsalgia, unspecified: Secondary | ICD-10-CM | POA: Diagnosis not present

## 2018-10-15 DIAGNOSIS — F0390 Unspecified dementia without behavioral disturbance: Secondary | ICD-10-CM | POA: Diagnosis not present

## 2018-10-15 DIAGNOSIS — G8929 Other chronic pain: Secondary | ICD-10-CM | POA: Diagnosis not present

## 2018-10-15 DIAGNOSIS — I110 Hypertensive heart disease with heart failure: Secondary | ICD-10-CM | POA: Diagnosis not present

## 2018-10-15 DIAGNOSIS — F3289 Other specified depressive episodes: Secondary | ICD-10-CM | POA: Diagnosis not present

## 2018-10-20 DIAGNOSIS — F0390 Unspecified dementia without behavioral disturbance: Secondary | ICD-10-CM | POA: Diagnosis not present

## 2018-10-20 DIAGNOSIS — F419 Anxiety disorder, unspecified: Secondary | ICD-10-CM | POA: Diagnosis not present

## 2018-10-20 DIAGNOSIS — I110 Hypertensive heart disease with heart failure: Secondary | ICD-10-CM | POA: Diagnosis not present

## 2018-10-20 DIAGNOSIS — E119 Type 2 diabetes mellitus without complications: Secondary | ICD-10-CM | POA: Diagnosis not present

## 2018-10-20 DIAGNOSIS — M549 Dorsalgia, unspecified: Secondary | ICD-10-CM | POA: Diagnosis not present

## 2018-10-20 DIAGNOSIS — I5032 Chronic diastolic (congestive) heart failure: Secondary | ICD-10-CM | POA: Diagnosis not present

## 2018-10-20 DIAGNOSIS — G8929 Other chronic pain: Secondary | ICD-10-CM | POA: Diagnosis not present

## 2018-10-20 DIAGNOSIS — F3289 Other specified depressive episodes: Secondary | ICD-10-CM | POA: Diagnosis not present

## 2018-10-20 DIAGNOSIS — I350 Nonrheumatic aortic (valve) stenosis: Secondary | ICD-10-CM | POA: Diagnosis not present

## 2018-10-24 DIAGNOSIS — I5032 Chronic diastolic (congestive) heart failure: Secondary | ICD-10-CM | POA: Diagnosis not present

## 2018-10-24 DIAGNOSIS — E119 Type 2 diabetes mellitus without complications: Secondary | ICD-10-CM | POA: Diagnosis not present

## 2018-10-24 DIAGNOSIS — F3289 Other specified depressive episodes: Secondary | ICD-10-CM | POA: Diagnosis not present

## 2018-10-24 DIAGNOSIS — M549 Dorsalgia, unspecified: Secondary | ICD-10-CM | POA: Diagnosis not present

## 2018-10-24 DIAGNOSIS — I110 Hypertensive heart disease with heart failure: Secondary | ICD-10-CM | POA: Diagnosis not present

## 2018-10-24 DIAGNOSIS — G8929 Other chronic pain: Secondary | ICD-10-CM | POA: Diagnosis not present

## 2018-10-24 DIAGNOSIS — F419 Anxiety disorder, unspecified: Secondary | ICD-10-CM | POA: Diagnosis not present

## 2018-10-24 DIAGNOSIS — I350 Nonrheumatic aortic (valve) stenosis: Secondary | ICD-10-CM | POA: Diagnosis not present

## 2018-10-24 DIAGNOSIS — F0390 Unspecified dementia without behavioral disturbance: Secondary | ICD-10-CM | POA: Diagnosis not present

## 2018-10-26 DIAGNOSIS — H903 Sensorineural hearing loss, bilateral: Secondary | ICD-10-CM | POA: Diagnosis not present

## 2018-10-27 ENCOUNTER — Other Ambulatory Visit: Payer: Self-pay | Admitting: Internal Medicine

## 2018-10-27 DIAGNOSIS — M47816 Spondylosis without myelopathy or radiculopathy, lumbar region: Secondary | ICD-10-CM

## 2018-10-27 MED ORDER — GABAPENTIN 600 MG PO TABS: 300.0000 mg | ORAL_TABLET | Freq: Three times a day (TID) | ORAL | 5 refills | Status: DC

## 2018-10-27 NOTE — Telephone Encounter (Signed)
Copied from [place] [phone]. Topic: Quick Communication - Rx Refill/Question >> Oct 27, 2018 12:44 PM Keene Breath wrote: Medication: gabapentin (NEURONTIN) 600 MG tablet  Patient called to request a refill for the above medication.  Patient states she is out of medication and needs it for her seizures  Preferred Pharmacy (with phone number or street name): [facility] [phone] - [place], [address] AT [address] [phone] (Phone) [phone] (Fax)

## 2018-10-27 NOTE — Telephone Encounter (Signed)
Patient called back to say that she only have enough medication for tonight. Asking for refill as soon as possible please.

## 2018-10-28 NOTE — Telephone Encounter (Signed)
MD approved and sent electronically to pof../lmb  

## 2018-11-09 ENCOUNTER — Other Ambulatory Visit: Payer: Self-pay | Admitting: Internal Medicine

## 2018-11-09 DIAGNOSIS — M159 Polyosteoarthritis, unspecified: Secondary | ICD-10-CM

## 2018-11-09 DIAGNOSIS — M150 Primary generalized (osteo)arthritis: Principal | ICD-10-CM

## 2018-12-11 ENCOUNTER — Other Ambulatory Visit: Payer: Self-pay | Admitting: Internal Medicine

## 2018-12-11 DIAGNOSIS — K219 Gastro-esophageal reflux disease without esophagitis: Secondary | ICD-10-CM

## 2018-12-11 DIAGNOSIS — K222 Esophageal obstruction: Principal | ICD-10-CM

## 2018-12-23 ENCOUNTER — Other Ambulatory Visit: Payer: Self-pay | Admitting: Internal Medicine

## 2018-12-23 DIAGNOSIS — F411 Generalized anxiety disorder: Secondary | ICD-10-CM

## 2018-12-27 ENCOUNTER — Other Ambulatory Visit: Payer: Self-pay | Admitting: Internal Medicine

## 2018-12-27 DIAGNOSIS — F339 Major depressive disorder, recurrent, unspecified: Secondary | ICD-10-CM

## 2019-01-01 ENCOUNTER — Ambulatory Visit: Payer: Self-pay

## 2019-01-01 NOTE — Telephone Encounter (Signed)
Returned call to patient who states that gabapentin causes her to feel loopy.  She also states that the Mobic made her feel breathless like having a heart attack and she has stopped taking it.  She says she feels dizzy lightheaded. She states that she needs to use her walker to keep from falling. She states the dizziness gets worse with position change. She states that she is not allowed to drive and that she has noticed she can not see. She says she takes the gabapentin for seizures.  Medication record indicates for lumbar spondylosis. NT spoke to office regarding multiple problems . Per Tanya Pt is to start decreasing her gabapentin to twice daily. If symptoms remain may go to once per day.  She may stop Mobic (pt reports that she has already stopped Mobic and has started the gradual decrease of Gabapentin. Appointment scheduled per patient time for Tuesday with her PCP. Care advice read to patient to include if she faints has seizure activity any worsening of her condition to call 911. Pt verbalized understanding of all instructions.  Reason for Disposition . Taking a medicine that could cause dizziness (e.g., blood pressure medications, diuretics)  Answer Assessment - Initial Assessment Questions 1. DESCRIPTION: "Describe your dizziness."     Sometimes feel faint light headed eyes affected 2. LIGHTHEADED: "Do you feel lightheaded?" (e.g., somewhat faint, woozy, weak upon standing)     Feel faint sometimes 3. VERTIGO: "Do you feel like either you or the room is spinning or tilting?" (i.e. vertigo)     Closing up feeling 4. SEVERITY: "How bad is it?"  "Do you feel like you are going to faint?" "Can you stand and walk?"   - MILD - walking normally   - MODERATE - interferes with normal activities (e.g., work, school)    - SEVERE - unable to stand, requires support to walk, feels like passing out now.      Uses walker to keep from falling 5. ONSET:  "When did the dizziness begin?"     December after  starting gabapentin, and Mobic 6. AGGRAVATING FACTORS: "Does anything make it worse?" (e.g., standing, change in head position)     Changing positions 7. HEART RATE: "Can you tell me your heart rate?" "How many beats in 15 seconds?"  (Note: not all patients can do this)      No 8. CAUSE: "What do you think is causing the dizziness?"     medications 9. RECURRENT SYMPTOM: "Have you had dizziness before?" If so, ask: "When was the last time?" "What happened that time?"    No 10. OTHER SYMPTOMS: "Do you have any other symptoms?" (e.g., fever, chest pain, vomiting, diarrhea, bleeding)      no 11. PREGNANCY: "Is there any chance you are pregnant?" "When was your last menstrual period?"       N/A  Protocols used: DIZZINESS [facility]

## 2019-01-05 ENCOUNTER — Encounter: Payer: Self-pay | Admitting: Internal Medicine

## 2019-01-05 ENCOUNTER — Ambulatory Visit (INDEPENDENT_AMBULATORY_CARE_PROVIDER_SITE_OTHER): Payer: Medicare HMO | Admitting: Internal Medicine

## 2019-01-05 VITALS — BP 144/80 | HR 60 | Temp 97.5°F | Ht 68.0 in | Wt 219.0 lb

## 2019-01-05 DIAGNOSIS — F411 Generalized anxiety disorder: Secondary | ICD-10-CM

## 2019-01-05 DIAGNOSIS — I5032 Chronic diastolic (congestive) heart failure: Secondary | ICD-10-CM | POA: Diagnosis not present

## 2019-01-05 DIAGNOSIS — R569 Unspecified convulsions: Secondary | ICD-10-CM

## 2019-01-05 DIAGNOSIS — I10 Essential (primary) hypertension: Secondary | ICD-10-CM

## 2019-01-05 MED ORDER — GABAPENTIN 100 MG PO CAPS: 100.0000 mg | ORAL_CAPSULE | Freq: Three times a day (TID) | ORAL | 3 refills | Status: DC

## 2019-01-05 NOTE — Progress Notes (Signed)
Subjective:  Patient ID: [name], female    DOB: 1933-06-27  Age: 85 y.o. MRN: [id number]  CC: Hypertension   HPI [name] presents for f/up - She complains that the current dose of gabapentin is causing side effects including visual hallucinations, dizziness, confusion, ataxia, and fatigue.  She has lowered the dose to 300 mg 3 times a day but if she tries to stop taking it she experiences symptoms of withdrawal so she wants to do a taper.  Outpatient Medications Prior to Visit  Medication Sig Dispense Refill  . acetaminophen (TYLENOL) 325 MG tablet Take 2 tablets (650 mg total) by mouth every 6 (six) hours as needed for moderate pain.    . calcium carbonate (OS-CAL) 600 MG TABS tablet Take 600 mg by mouth 2 (two) times daily with a meal.    . carvedilol (COREG) 6.25 MG tablet TAKE 1 TABLET BY MOUTH TWICE A DAY WITH MEALS 180 tablet 1  . chlorthalidone (HYGROTON) 25 MG tablet TAKE 1 TABLET BY MOUTH EVERY DAY 90 tablet 1  . Cholecalciferol 2000 units TABS Take 2 tablets (4,000 Units total) by mouth daily. 180 tablet 1  . clonazePAM (KLONOPIN) 1 MG tablet TAKE 1 TABLET BY MOUTH TWICE DAILY AS NEEDED FOR ANXIETY 60 tablet 3  . MYRBETRIQ 25 MG TB24 tablet TAKE 1 TABLET BY MOUTH EVERY DAY 90 tablet 1  . omeprazole (PRILOSEC) 40 MG capsule TAKE 1 CAPSULE BY MOUTH EVERY DAY 90 capsule 1  . traZODone (DESYREL) 100 MG tablet TAKE 2 TABLETS BY MOUTH AT BEDTIME 180 tablet 1  . gabapentin (NEURONTIN) 600 MG tablet Take 0.5 tablets (300 mg total) by mouth 3 (three) times daily. 90 tablet 5  . meloxicam (MOBIC) 7.5 MG tablet TAKE 1 TABLET(7.5 MG) BY MOUTH DAILY 90 tablet 0   No facility-administered medications prior to visit.     ROS Review of Systems  Constitutional: Positive for fatigue. Negative for appetite change, fever and unexpected weight change.  HENT: Negative.   Eyes: Negative for visual disturbance.  Respiratory: Negative for cough, chest tightness, shortness of breath and  wheezing.   Cardiovascular: Negative for chest pain, palpitations and leg swelling.  Gastrointestinal: Negative for abdominal pain, constipation, diarrhea and nausea.  Genitourinary: Negative.  Negative for difficulty urinating.  Musculoskeletal: Positive for gait problem. Negative for arthralgias, myalgias and neck pain.  Skin: Negative.  Negative for color change, pallor and rash.  Neurological: Positive for dizziness. Negative for weakness, light-headedness, numbness and headaches.  Psychiatric/Behavioral: Positive for confusion, decreased concentration, hallucinations and sleep disturbance. Negative for self-injury and suicidal ideas. The patient is nervous/anxious.     Objective:  BP (!) 144/80 (BP Location: Left Arm, Patient Position: Sitting, Cuff Size: Large)   Pulse 60   Temp (!) 97.5 F (36.4 C) (Oral)   Ht 5\' 8"  (1.727 m)   Wt 219 lb (99.3 kg)   SpO2 95%   BMI 33.30 kg/m   BP Readings from Last 3 Encounters:  01/05/19 (!) 144/80  09/15/18 140/80  08/28/18 (!) 173/76    Wt Readings from Last 3 Encounters:  01/05/19 219 lb (99.3 kg)  09/15/18 196 lb 8 oz (89.1 kg)  08/25/18 200 lb (90.7 kg)    Physical Exam Vitals signs reviewed.  Constitutional:      Appearance: She is obese. She is not ill-appearing or diaphoretic.  HENT:     Nose: Nose normal. No congestion or rhinorrhea.     Mouth/Throat:  Mouth: Mucous membranes are moist.     Pharynx: Oropharynx is clear. No oropharyngeal exudate or posterior oropharyngeal erythema.  Eyes:     General: No scleral icterus.    Conjunctiva/sclera: Conjunctivae normal.  Neck:     Musculoskeletal: Normal range of motion and neck supple. No neck rigidity or muscular tenderness.  Cardiovascular:     Rate and Rhythm: Normal rate and regular rhythm.     Heart sounds: Murmur present. Systolic murmur present with a grade of 1/6. No diastolic murmur.  Pulmonary:     Breath sounds: No stridor. No wheezing, rhonchi or rales.    Abdominal:     General: Bowel sounds are normal.     Palpations: There is no hepatomegaly or splenomegaly.     Tenderness: There is no abdominal tenderness.  Musculoskeletal: Normal range of motion.        General: No swelling.     Right lower leg: No edema.     Left lower leg: No edema.  Lymphadenopathy:     Cervical: No cervical adenopathy.  Skin:    General: Skin is dry.     Coloration: Skin is not pale.  Neurological:     General: No focal deficit present.     Mental Status: She is oriented to person, place, and time. Mental status is at baseline.     Cranial Nerves: Cranial nerves are intact.     Sensory: Sensation is intact.     Motor: Motor function is intact. No weakness, tremor, atrophy or abnormal muscle tone.     Coordination: Romberg sign positive. Coordination abnormal.     Lab Results  Component Value Date   WBC 8.4 08/27/2018   HGB 12.6 08/27/2018   HCT 39.9 08/27/2018   PLT 144 (L) 08/27/2018   GLUCOSE 92 09/15/2018   CHOL 194 08/26/2018   TRIG 95 08/26/2018   HDL 69 08/26/2018   LDLDIRECT 121.0 06/12/2017   LDLCALC 106 (H) 08/26/2018   ALT 16 08/25/2018   AST 34 08/25/2018   NA 141 09/15/2018   K 4.2 09/15/2018   CL 99 09/15/2018   CREATININE 0.60 09/15/2018   BUN 13 09/15/2018   CO2 35 (H) 09/15/2018   TSH 2.350 08/25/2018   INR 1.03 08/25/2018   HGBA1C 5.0 08/26/2018    Ct Angio Head W Or Wo Contrast  Result Date: 08/25/2018 CLINICAL DATA:  Initial evaluation for acute altered mental status. EXAM: CT ANGIOGRAPHY HEAD AND NECK CT PERFUSION BRAIN TECHNIQUE: Multidetector CT imaging of the head and neck was performed using the standard protocol during bolus administration of intravenous contrast. Multiplanar CT image reconstructions and MIPs were obtained to evaluate the vascular anatomy. Carotid stenosis measurements (when applicable) are obtained utilizing NASCET criteria, using the distal internal carotid diameter as the denominator. Multiphase  CT imaging of the brain was performed following IV bolus contrast injection. Subsequent parametric perfusion maps were calculated using RAPID software. CONTRAST:  193mL ISOVUE-370 IOPAMIDOL (ISOVUE-370) INJECTION 76% COMPARISON:  Prior CT from earlier the same day. FINDINGS: CT HEAD FINDINGS Brain: Atrophy with chronic microvascular ischemic disease again noted, stable. Small remote right thalamic and right cerebellar infarcts noted. No evidence for acute or interval intracranial hemorrhage. No acute large vessel territory infarct. No mass lesion, midline shift or mass effect. No hydrocephalus. No extra-axial fluid collection. Vascular: No hyperdense vessel. Scattered vascular calcifications noted within the carotid siphons. Skull: Scalp soft tissues and calvarium within normal limits. Sinuses/Orbits: Right maxillary sinus retention cyst. Acute on chronic  right sphenoid sinusitis. Mastoid air cells and middle ear cavities are clear. Globes and orbital soft tissues within normal limits. Other: None. CTA NECK FINDINGS Aortic arch: Visualized aortic arch of normal caliber with normal branch pattern. Incidental note made of a bovine arch with common origin of the left common and brachiocephalic arteries. Mild atherosclerotic change about the arch and origin of the great vessels without hemodynamically significant stenosis. Visualized subclavian arteries widely patent. Right carotid system: Right common carotid artery patent from its origin to the bifurcation without stenosis. A centric calcified plaque about the proximal right ICA with associated stenosis of up to 70% by NASCET criteria. Right ICA tortuous but otherwise patent to the skull base without stenosis, dissection, or occlusion Left carotid system: Left common carotid artery tortuous proximally but widely patent to the bifurcation. Scattered calcified plaque about the proximal left ICA with associated stenosis of up to 75-80% by NASCET criteria. Left ICA  tortuous but otherwise patent to the skull base. Both internal carotid arteries medialized into the retropharyngeal space. Vertebral arteries: Both of the vertebral arteries arise from the subclavian arteries. Left vertebral artery dominant. Mild plaque at the origin of the vertebral arteries bilaterally without significant stenosis. Vertebral arteries widely patent within the neck without stenosis, dissection, or occlusion. Skeleton: No acute osseous abnormality. No discrete osseous lesions. Prominent osteoarthritic changes about the C1-2 articulation. Advanced right-sided facet arthrosis at C2-3. Other neck: Thyroid diffuse lead enlarged and heterogeneous in appearance, likely related to goiter. Soft tissues of the neck demonstrate no acute finding. Upper chest: Visualized upper chest and mediastinum within normal limits. Small focus of tree-in-bud nodular opacity at the posterior left upper lobe (series 11, image 159), consistent with mild small airways disease. Review of the MIP images confirms the above findings CTA HEAD FINDINGS Anterior circulation: Petrous segments widely patent bilaterally. Calcified plaque within the cavernous/supraclinoid ICAs with resultant mild to moderate multifocal narrowing, right slightly worse than left. ICA termini widely patent. A1 segments patent bilaterally. Normal anterior communicating artery. Anterior cerebral arteries patent to their distal aspects without stenosis. No M1 stenosis or occlusion. No proximal M2 occlusion. Distal MCA branches well perfused and symmetric. Posterior circulation: Focal non stenotic plaque noted within the dominant left V4 segment. Left vertebral artery otherwise widely patent to the vertebrobasilar junction. Right vertebral artery diminutive but widely patent as well. Posterior inferior cerebral arteries patent bilaterally. Basilar tortuous but patent to its distal aspect without high-grade stenosis. Superior cerebral arteries patent bilaterally.  Right PCA supplied via the basilar. Fetal type origin of the left PCA. Single short-segment moderate left P2 stenosis noted. PCAs otherwise widely patent to their distal aspects. Venous sinuses: Not well assessed due to arterial timing of the contrast bolus, but grossly patent. Anatomic variants: Fetal type left PCA.  No intracranial aneurysm. Delayed phase: Not performed. Review of the MIP images confirms the above findings CT Brain Perfusion Findings: CBF (<30%) Volume: 3mL Perfusion (Tmax>6.0s) volume: 62mL Mismatch Volume: 14mL Infarction Location:Negative. IMPRESSION: 1. Negative CTA with no large vessel occlusion identified. 2. Negative CT perfusion with no evidence for core infarct or perfusion abnormality. 3. Atheromatous stenoses about the carotid bifurcations bilaterally, measuring up to 70% on the right and 75-80% on the left. 4. Additional mild scattered atherosclerotic change elsewhere within the major arterial vasculature of the head and neck as above. No other high-grade or correctable stenosis. 5. Thyroid goiter. Electronically Signed   By: Jeannine Boga M.D.   On: 08/25/2018 20:03   Ct Head Wo  Contrast  Result Date: 08/25/2018 CLINICAL DATA:  Altered mental status 2 days. EXAM: CT HEAD WITHOUT CONTRAST TECHNIQUE: Contiguous axial images were obtained from the base of the skull through the vertex without intravenous contrast. COMPARISON:  08/05/2017 and 10/26/2013 FINDINGS: Brain: Ventricles, cisterns and other CSF spaces are within normal. There is mild chronic ischemic microvascular disease. Tiny old right thalamic lacunar infarct. No mass, mass effect, shift of midline structures or acute hemorrhage. No evidence of acute infarction. Vascular: No hyperdense vessel or unexpected calcification. Skull: Well-defined sclerotic focus over the left frontal skull unchanged. Sinuses/Orbits: No acute finding. Other: None. IMPRESSION: No acute findings. Mild chronic ischemic microvascular disease.  Small old thalamic lacunar infarct. Electronically Signed   By: Marin Olp M.D.   On: 08/25/2018 16:09   Ct Angio Neck W Or Wo Contrast  Result Date: 08/25/2018 CLINICAL DATA:  Initial evaluation for acute altered mental status. EXAM: CT ANGIOGRAPHY HEAD AND NECK CT PERFUSION BRAIN TECHNIQUE: Multidetector CT imaging of the head and neck was performed using the standard protocol during bolus administration of intravenous contrast. Multiplanar CT image reconstructions and MIPs were obtained to evaluate the vascular anatomy. Carotid stenosis measurements (when applicable) are obtained utilizing NASCET criteria, using the distal internal carotid diameter as the denominator. Multiphase CT imaging of the brain was performed following IV bolus contrast injection. Subsequent parametric perfusion maps were calculated using RAPID software. CONTRAST:  115mL ISOVUE-370 IOPAMIDOL (ISOVUE-370) INJECTION 76% COMPARISON:  Prior CT from earlier the same day. FINDINGS: CT HEAD FINDINGS Brain: Atrophy with chronic microvascular ischemic disease again noted, stable. Small remote right thalamic and right cerebellar infarcts noted. No evidence for acute or interval intracranial hemorrhage. No acute large vessel territory infarct. No mass lesion, midline shift or mass effect. No hydrocephalus. No extra-axial fluid collection. Vascular: No hyperdense vessel. Scattered vascular calcifications noted within the carotid siphons. Skull: Scalp soft tissues and calvarium within normal limits. Sinuses/Orbits: Right maxillary sinus retention cyst. Acute on chronic right sphenoid sinusitis. Mastoid air cells and middle ear cavities are clear. Globes and orbital soft tissues within normal limits. Other: None. CTA NECK FINDINGS Aortic arch: Visualized aortic arch of normal caliber with normal branch pattern. Incidental note made of a bovine arch with common origin of the left common and brachiocephalic arteries. Mild atherosclerotic change  about the arch and origin of the great vessels without hemodynamically significant stenosis. Visualized subclavian arteries widely patent. Right carotid system: Right common carotid artery patent from its origin to the bifurcation without stenosis. A centric calcified plaque about the proximal right ICA with associated stenosis of up to 70% by NASCET criteria. Right ICA tortuous but otherwise patent to the skull base without stenosis, dissection, or occlusion Left carotid system: Left common carotid artery tortuous proximally but widely patent to the bifurcation. Scattered calcified plaque about the proximal left ICA with associated stenosis of up to 75-80% by NASCET criteria. Left ICA tortuous but otherwise patent to the skull base. Both internal carotid arteries medialized into the retropharyngeal space. Vertebral arteries: Both of the vertebral arteries arise from the subclavian arteries. Left vertebral artery dominant. Mild plaque at the origin of the vertebral arteries bilaterally without significant stenosis. Vertebral arteries widely patent within the neck without stenosis, dissection, or occlusion. Skeleton: No acute osseous abnormality. No discrete osseous lesions. Prominent osteoarthritic changes about the C1-2 articulation. Advanced right-sided facet arthrosis at C2-3. Other neck: Thyroid diffuse lead enlarged and heterogeneous in appearance, likely related to goiter. Soft tissues of the neck demonstrate no  acute finding. Upper chest: Visualized upper chest and mediastinum within normal limits. Small focus of tree-in-bud nodular opacity at the posterior left upper lobe (series 11, image 159), consistent with mild small airways disease. Review of the MIP images confirms the above findings CTA HEAD FINDINGS Anterior circulation: Petrous segments widely patent bilaterally. Calcified plaque within the cavernous/supraclinoid ICAs with resultant mild to moderate multifocal narrowing, right slightly worse than  left. ICA termini widely patent. A1 segments patent bilaterally. Normal anterior communicating artery. Anterior cerebral arteries patent to their distal aspects without stenosis. No M1 stenosis or occlusion. No proximal M2 occlusion. Distal MCA branches well perfused and symmetric. Posterior circulation: Focal non stenotic plaque noted within the dominant left V4 segment. Left vertebral artery otherwise widely patent to the vertebrobasilar junction. Right vertebral artery diminutive but widely patent as well. Posterior inferior cerebral arteries patent bilaterally. Basilar tortuous but patent to its distal aspect without high-grade stenosis. Superior cerebral arteries patent bilaterally. Right PCA supplied via the basilar. Fetal type origin of the left PCA. Single short-segment moderate left P2 stenosis noted. PCAs otherwise widely patent to their distal aspects. Venous sinuses: Not well assessed due to arterial timing of the contrast bolus, but grossly patent. Anatomic variants: Fetal type left PCA.  No intracranial aneurysm. Delayed phase: Not performed. Review of the MIP images confirms the above findings CT Brain Perfusion Findings: CBF (<30%) Volume: 65mL Perfusion (Tmax>6.0s) volume: 55mL Mismatch Volume: 7mL Infarction Location:Negative. IMPRESSION: 1. Negative CTA with no large vessel occlusion identified. 2. Negative CT perfusion with no evidence for core infarct or perfusion abnormality. 3. Atheromatous stenoses about the carotid bifurcations bilaterally, measuring up to 70% on the right and 75-80% on the left. 4. Additional mild scattered atherosclerotic change elsewhere within the major arterial vasculature of the head and neck as above. No other high-grade or correctable stenosis. 5. Thyroid goiter. Electronically Signed   By: Jeannine Boga M.D.   On: 08/25/2018 20:03   Mr Brain Wo Contrast  Result Date: 08/26/2018 CLINICAL DATA:  Altered mental status. New onset seizures. History of  hypertension, hyperlipidemia, diabetes. EXAM: MRI HEAD WITHOUT CONTRAST TECHNIQUE: Multiplanar, multiecho pulse sequences of the brain and surrounding structures were obtained without intravenous contrast. COMPARISON:  CT HEAD August 25, 2018 FINDINGS: Moderately motion degraded examination. INTRACRANIAL CONTENTS: No reduced diffusion to suggest acute ischemia. No susceptibility artifact to suggest hemorrhage. The ventricles and sulci are normal for patient's age. Patchy supratentorial white matter FLAIR T2 hyperintensities. Old small bilateral cerebellar infarcts. Old RIGHT thalamus lacunar infarct. No suspicious parenchymal signal, masses, mass effect. No abnormal extra-axial fluid collections. No extra-axial masses. Nondiagnostic thin slice coronal imaging of the hippocampi. VASCULAR: Normal major intracranial vascular flow voids present at skull base. SKULL AND UPPER CERVICAL SPINE: Mild empty sella. No suspicious calvarial bone marrow signal. Craniocervical junction maintained. SINUSES/ORBITS: The mastoid air-cells and included paranasal sinuses are well-aerated.The included ocular globes and orbital contents are non-suspicious. Status post bilateral ocular lens implants. OTHER: Patient is edentulous. IMPRESSION: 1. No acute intracranial process on this motion degraded examination. 2. Old small RIGHT thalamus and bilateral cerebellar infarcts. 3. Moderate chronic small vessel ischemic changes. Electronically Signed   By: Elon Alas M.D.   On: 08/26/2018 22:39   Ct Abdomen Pelvis W Contrast  Result Date: 08/25/2018 CLINICAL DATA:  Acute abdominal pain EXAM: CT ABDOMEN AND PELVIS WITH CONTRAST TECHNIQUE: Multidetector CT imaging of the abdomen and pelvis was performed using the standard protocol following bolus administration of intravenous contrast. CONTRAST:  180mL  ISOVUE-370 IOPAMIDOL (ISOVUE-370) INJECTION 76% COMPARISON:  CT 07/07/2018, 05/20/2014 FINDINGS: Lower chest: Lung bases demonstrate no  acute consolidation or effusion. Heart size upper limits of normal Hepatobiliary: Status post cholecystectomy. No focal hepatic abnormality. Stable slight enlargement of the extrahepatic bile duct. Pancreas: Unremarkable. No pancreatic ductal dilatation or surrounding inflammatory changes. Spleen: Normal in size without focal abnormality. Adrenals/Urinary Tract: Adrenal glands are stable. 2.7 cm right adrenal mass. Left adrenal gland is normal. No hydronephrosis. Subcentimeter hypodense renal lesions too small to further characterize. Bladder normal Stomach/Bowel: Stomach is nonenlarged. No dilated small bowel. No colon wall thickening. Sigmoid colon diverticula without acute inflammatory process. Vascular/Lymphatic: Nonaneurysmal aorta. Extensive aortic atherosclerosis. No significantly enlarged lymph nodes. Reproductive: Status post hysterectomy. No adnexal masses. Other: Negative for free air or free fluid. Fat within the bilateral inguinal regions. Musculoskeletal: Degenerative changes of the spine. No acute or suspicious abnormality. IMPRESSION: 1. No CT evidence for acute intra-abdominal or pelvic abnormality. 2. Status post cholecystectomy. Slight prominence of the extrahepatic bile duct but without interval increase in dilatation. No intra hepatic biliary dilatation is visualized. 3. Stable 2.7 cm right adrenal gland adenoma 4. Sigmoid colon diverticular disease without acute inflammatory process. Electronically Signed   By: Donavan Foil M.D.   On: 08/25/2018 19:18   Ct Cerebral Perfusion W Contrast  Result Date: 08/25/2018 CLINICAL DATA:  Initial evaluation for acute altered mental status. EXAM: CT ANGIOGRAPHY HEAD AND NECK CT PERFUSION BRAIN TECHNIQUE: Multidetector CT imaging of the head and neck was performed using the standard protocol during bolus administration of intravenous contrast. Multiplanar CT image reconstructions and MIPs were obtained to evaluate the vascular anatomy. Carotid stenosis  measurements (when applicable) are obtained utilizing NASCET criteria, using the distal internal carotid diameter as the denominator. Multiphase CT imaging of the brain was performed following IV bolus contrast injection. Subsequent parametric perfusion maps were calculated using RAPID software. CONTRAST:  119mL ISOVUE-370 IOPAMIDOL (ISOVUE-370) INJECTION 76% COMPARISON:  Prior CT from earlier the same day. FINDINGS: CT HEAD FINDINGS Brain: Atrophy with chronic microvascular ischemic disease again noted, stable. Small remote right thalamic and right cerebellar infarcts noted. No evidence for acute or interval intracranial hemorrhage. No acute large vessel territory infarct. No mass lesion, midline shift or mass effect. No hydrocephalus. No extra-axial fluid collection. Vascular: No hyperdense vessel. Scattered vascular calcifications noted within the carotid siphons. Skull: Scalp soft tissues and calvarium within normal limits. Sinuses/Orbits: Right maxillary sinus retention cyst. Acute on chronic right sphenoid sinusitis. Mastoid air cells and middle ear cavities are clear. Globes and orbital soft tissues within normal limits. Other: None. CTA NECK FINDINGS Aortic arch: Visualized aortic arch of normal caliber with normal branch pattern. Incidental note made of a bovine arch with common origin of the left common and brachiocephalic arteries. Mild atherosclerotic change about the arch and origin of the great vessels without hemodynamically significant stenosis. Visualized subclavian arteries widely patent. Right carotid system: Right common carotid artery patent from its origin to the bifurcation without stenosis. A centric calcified plaque about the proximal right ICA with associated stenosis of up to 70% by NASCET criteria. Right ICA tortuous but otherwise patent to the skull base without stenosis, dissection, or occlusion Left carotid system: Left common carotid artery tortuous proximally but widely patent to the  bifurcation. Scattered calcified plaque about the proximal left ICA with associated stenosis of up to 75-80% by NASCET criteria. Left ICA tortuous but otherwise patent to the skull base. Both internal carotid arteries medialized into the retropharyngeal space.  Vertebral arteries: Both of the vertebral arteries arise from the subclavian arteries. Left vertebral artery dominant. Mild plaque at the origin of the vertebral arteries bilaterally without significant stenosis. Vertebral arteries widely patent within the neck without stenosis, dissection, or occlusion. Skeleton: No acute osseous abnormality. No discrete osseous lesions. Prominent osteoarthritic changes about the C1-2 articulation. Advanced right-sided facet arthrosis at C2-3. Other neck: Thyroid diffuse lead enlarged and heterogeneous in appearance, likely related to goiter. Soft tissues of the neck demonstrate no acute finding. Upper chest: Visualized upper chest and mediastinum within normal limits. Small focus of tree-in-bud nodular opacity at the posterior left upper lobe (series 11, image 159), consistent with mild small airways disease. Review of the MIP images confirms the above findings CTA HEAD FINDINGS Anterior circulation: Petrous segments widely patent bilaterally. Calcified plaque within the cavernous/supraclinoid ICAs with resultant mild to moderate multifocal narrowing, right slightly worse than left. ICA termini widely patent. A1 segments patent bilaterally. Normal anterior communicating artery. Anterior cerebral arteries patent to their distal aspects without stenosis. No M1 stenosis or occlusion. No proximal M2 occlusion. Distal MCA branches well perfused and symmetric. Posterior circulation: Focal non stenotic plaque noted within the dominant left V4 segment. Left vertebral artery otherwise widely patent to the vertebrobasilar junction. Right vertebral artery diminutive but widely patent as well. Posterior inferior cerebral arteries patent  bilaterally. Basilar tortuous but patent to its distal aspect without high-grade stenosis. Superior cerebral arteries patent bilaterally. Right PCA supplied via the basilar. Fetal type origin of the left PCA. Single short-segment moderate left P2 stenosis noted. PCAs otherwise widely patent to their distal aspects. Venous sinuses: Not well assessed due to arterial timing of the contrast bolus, but grossly patent. Anatomic variants: Fetal type left PCA.  No intracranial aneurysm. Delayed phase: Not performed. Review of the MIP images confirms the above findings CT Brain Perfusion Findings: CBF (<30%) Volume: 90mL Perfusion (Tmax>6.0s) volume: 81mL Mismatch Volume: 77mL Infarction Location:Negative. IMPRESSION: 1. Negative CTA with no large vessel occlusion identified. 2. Negative CT perfusion with no evidence for core infarct or perfusion abnormality. 3. Atheromatous stenoses about the carotid bifurcations bilaterally, measuring up to 70% on the right and 75-80% on the left. 4. Additional mild scattered atherosclerotic change elsewhere within the major arterial vasculature of the head and neck as above. No other high-grade or correctable stenosis. 5. Thyroid goiter. Electronically Signed   By: Jeannine Boga M.D.   On: 08/25/2018 20:03   Dg Chest Port 1 View  Result Date: 08/25/2018 CLINICAL DATA:  History of CHF.  Altered mental status. EXAM: PORTABLE CHEST 1 VIEW COMPARISON:  11/02/2017.  03/10/2015.  CT 05/19/2014. FINDINGS: Stable prominence of the superior mediastinum consistent with substernal goiter as previously noted on CT of 05/19/2014. Heart size stable. Low lung volumes mild basilar atelectasis. No focal alveolar infiltrate. No pleural effusion or pneumothorax. IMPRESSION: 1.  Substernal goiter. 2. Low lung volumes with mild basilar atelectasis. Exam otherwise unremarkable. Electronically Signed   By: Marcello Moores  Register   On: 08/25/2018 15:04    Assessment & Plan:   [name] was seen today for  hypertension.  Diagnoses and all orders for this visit:  Seizure [facility])- Her neuro exam is unchanged compared to her baseline.  She will decrease her gabapentin dose to 100 mg 3 times a day. -     gabapentin (NEURONTIN) 100 MG capsule; Take 1 capsule (100 mg total) by mouth 3 (three) times daily.  GAD (generalized anxiety disorder) -     gabapentin (NEURONTIN) 100  MG capsule; Take 1 capsule (100 mg total) by mouth 3 (three) times daily.  Chronic diastolic CHF (congestive heart failure) ([place])- There is no evidence of fluid overload and her symptoms are well controlled.  Essential hypertension- Her blood pressure is well controlled.   I have discontinued [name]'s gabapentin and meloxicam. I am also having her start on gabapentin. Additionally, I am having her maintain her calcium carbonate, Cholecalciferol, carvedilol, acetaminophen, chlorthalidone, MYRBETRIQ, omeprazole, clonazePAM, and traZODone.  Meds ordered this encounter  Medications  . gabapentin (NEURONTIN) 100 MG capsule    Sig: Take 1 capsule (100 mg total) by mouth 3 (three) times daily.    Dispense:  90 capsule    Refill:  3     Follow-up: Return in about 4 months (around 05/06/2019).  Scarlette Calico, MD

## 2019-01-05 NOTE — Patient Instructions (Signed)

## 2019-01-29 ENCOUNTER — Emergency Department (HOSPITAL_COMMUNITY): Payer: Medicare HMO

## 2019-01-29 ENCOUNTER — Encounter (HOSPITAL_COMMUNITY): Payer: Self-pay | Admitting: Emergency Medicine

## 2019-01-29 ENCOUNTER — Ambulatory Visit (INDEPENDENT_AMBULATORY_CARE_PROVIDER_SITE_OTHER): Payer: Medicare HMO | Admitting: Family

## 2019-01-29 ENCOUNTER — Other Ambulatory Visit: Payer: Self-pay

## 2019-01-29 ENCOUNTER — Inpatient Hospital Stay (HOSPITAL_COMMUNITY)
Admission: EM | Admit: 2019-01-29 | Discharge: 2019-02-01 | DRG: 193 | Disposition: A | Discharge status: Home / Self Care | Payer: Medicare HMO | Source: Ambulatory Visit | Attending: Internal Medicine | Admitting: Internal Medicine

## 2019-01-29 ENCOUNTER — Encounter: Payer: Self-pay | Admitting: Family

## 2019-01-29 VITALS — BP 140/78 | HR 77 | Temp 98.3°F | Ht 68.0 in | Wt 224.0 lb

## 2019-01-29 DIAGNOSIS — J9601 Acute respiratory failure with hypoxia: Secondary | ICD-10-CM | POA: Diagnosis present

## 2019-01-29 DIAGNOSIS — J181 Lobar pneumonia, unspecified organism: Secondary | ICD-10-CM

## 2019-01-29 DIAGNOSIS — R0602 Shortness of breath: Secondary | ICD-10-CM | POA: Diagnosis not present

## 2019-01-29 DIAGNOSIS — Z6834 Body mass index (BMI) 34.0-34.9, adult: Secondary | ICD-10-CM | POA: Diagnosis not present

## 2019-01-29 DIAGNOSIS — J189 Pneumonia, unspecified organism: Principal | ICD-10-CM | POA: Diagnosis present

## 2019-01-29 DIAGNOSIS — F419 Anxiety disorder, unspecified: Secondary | ICD-10-CM | POA: Diagnosis present

## 2019-01-29 DIAGNOSIS — I5032 Chronic diastolic (congestive) heart failure: Secondary | ICD-10-CM | POA: Diagnosis not present

## 2019-01-29 DIAGNOSIS — E669 Obesity, unspecified: Secondary | ICD-10-CM | POA: Diagnosis present

## 2019-01-29 DIAGNOSIS — R0902 Hypoxemia: Secondary | ICD-10-CM

## 2019-01-29 DIAGNOSIS — Z96651 Presence of right artificial knee joint: Secondary | ICD-10-CM | POA: Diagnosis present

## 2019-01-29 DIAGNOSIS — I110 Hypertensive heart disease with heart failure: Secondary | ICD-10-CM | POA: Diagnosis present

## 2019-01-29 DIAGNOSIS — Z825 Family history of asthma and other chronic lower respiratory diseases: Secondary | ICD-10-CM

## 2019-01-29 DIAGNOSIS — K219 Gastro-esophageal reflux disease without esophagitis: Secondary | ICD-10-CM | POA: Diagnosis present

## 2019-01-29 DIAGNOSIS — Z8249 Family history of ischemic heart disease and other diseases of the circulatory system: Secondary | ICD-10-CM | POA: Diagnosis not present

## 2019-01-29 DIAGNOSIS — I10 Essential (primary) hypertension: Secondary | ICD-10-CM | POA: Diagnosis not present

## 2019-01-29 LAB — CBC WITH DIFFERENTIAL/PLATELET
Abs Immature Granulocytes: 0.05 10*3/uL (ref 0.00–0.07)
Basophils Absolute: 0 10*3/uL (ref 0.0–0.1)
Basophils Relative: 0 %
Eosinophils Absolute: 0.3 10*3/uL (ref 0.0–0.5)
Eosinophils Relative: 3 %
HCT: 41.7 % (ref 36.0–46.0)
Hemoglobin: 12.1 g/dL (ref 12.0–15.0)
Immature Granulocytes: 0 %
LYMPHS PCT: 22 %
Lymphs Abs: 2.6 10*3/uL (ref 0.7–4.0)
MCH: 29.3 pg (ref 26.0–34.0)
MCHC: 29 g/dL — AB (ref 30.0–36.0)
MCV: 101 fL — ABNORMAL HIGH (ref 80.0–100.0)
Monocytes Absolute: 0.7 10*3/uL (ref 0.1–1.0)
Monocytes Relative: 6 %
Neutro Abs: 7.8 10*3/uL — ABNORMAL HIGH (ref 1.7–7.7)
Neutrophils Relative %: 69 %
Platelets: 147 10*3/uL — ABNORMAL LOW (ref 150–400)
RBC: 4.13 MIL/uL (ref 3.87–5.11)
RDW: 12.7 % (ref 11.5–15.5)
WBC: 11.4 10*3/uL — ABNORMAL HIGH (ref 4.0–10.5)
nRBC: 0 % (ref 0.0–0.2)

## 2019-01-29 LAB — I-STAT TROPONIN, ED: Troponin i, poc: 0.02 ng/mL (ref 0.00–0.08)

## 2019-01-29 LAB — URINALYSIS, ROUTINE W REFLEX MICROSCOPIC
Bilirubin Urine: NEGATIVE
Glucose, UA: NEGATIVE mg/dL
Ketones, ur: NEGATIVE mg/dL
Nitrite: NEGATIVE
Specific Gravity, Urine: 1.025 (ref 1.005–1.030)
pH: 5.5 (ref 5.0–8.0)

## 2019-01-29 LAB — BASIC METABOLIC PANEL
Anion gap: 7 (ref 5–15)
BUN: 11 mg/dL (ref 8–23)
CO2: 36 mmol/L — ABNORMAL HIGH (ref 22–32)
Calcium: 8.7 mg/dL — ABNORMAL LOW (ref 8.9–10.3)
Chloride: 96 mmol/L — ABNORMAL LOW (ref 98–111)
Creatinine, Ser: 0.78 mg/dL (ref 0.44–1.00)
GFR calc Af Amer: >60 mL/min (ref >60)
GFR calc non Af Amer: >60 mL/min (ref >60)
Glucose, Bld: 98 mg/dL (ref 70–99)
Potassium: 4.8 mmol/L (ref 3.5–5.1)
Sodium: 139 mmol/L (ref 135–145)

## 2019-01-29 LAB — GROUP A STREP BY PCR: Group A Strep by PCR: NOT DETECTED

## 2019-01-29 LAB — URINALYSIS, MICROSCOPIC (REFLEX)

## 2019-01-29 LAB — BRAIN NATRIURETIC PEPTIDE: B Natriuretic Peptide: 144.2 pg/mL — ABNORMAL HIGH (ref 0.0–100.0)

## 2019-01-29 LAB — INFLUENZA PANEL BY PCR (TYPE A & B)
INFLBPCR: NEGATIVE
Influenza A By PCR: NEGATIVE

## 2019-01-29 MED ORDER — SODIUM CHLORIDE 0.9 % IV SOLN
500.0000 mg | INTRAVENOUS | Status: DC
  Administered 2019-01-30 – 2019-01-31 (×2): 500 mg via INTRAVENOUS
  Filled 2019-01-29 (×2): qty 500

## 2019-01-29 MED ORDER — IPRATROPIUM-ALBUTEROL 0.5-2.5 (3) MG/3ML IN SOLN
3.0000 mL | Freq: Three times a day (TID) | RESPIRATORY_TRACT | Status: DC
  Administered 2019-01-30 – 2019-02-01 (×7): 3 mL via RESPIRATORY_TRACT
  Filled 2019-01-29 (×7): qty 3

## 2019-01-29 MED ORDER — GABAPENTIN 100 MG PO CAPS
100.0000 mg | ORAL_CAPSULE | Freq: Three times a day (TID) | ORAL | Status: DC
  Administered 2019-01-29 – 2019-02-01 (×8): 100 mg via ORAL
  Filled 2019-01-29 (×8): qty 1

## 2019-01-29 MED ORDER — ACETAMINOPHEN 325 MG PO TABS
650.0000 mg | ORAL_TABLET | Freq: Four times a day (QID) | ORAL | Status: DC | PRN
  Administered 2019-01-30 – 2019-02-01 (×3): 650 mg via ORAL
  Filled 2019-01-29 (×3): qty 2

## 2019-01-29 MED ORDER — MIRABEGRON ER 25 MG PO TB24
25.0000 mg | ORAL_TABLET | Freq: Every day | ORAL | Status: DC
  Administered 2019-01-30 – 2019-02-01 (×3): 25 mg via ORAL
  Filled 2019-01-29 (×3): qty 1

## 2019-01-29 MED ORDER — TRAZODONE HCL 50 MG PO TABS
50.0000 mg | ORAL_TABLET | Freq: Every day | ORAL | Status: DC
  Administered 2019-01-29 – 2019-01-31 (×3): 50 mg via ORAL
  Filled 2019-01-29 (×3): qty 1

## 2019-01-29 MED ORDER — SODIUM CHLORIDE 0.9 % IV SOLN
1.0000 g | Freq: Once | INTRAVENOUS | Status: AC
  Administered 2019-01-29: 1 g via INTRAVENOUS
  Filled 2019-01-29: qty 10

## 2019-01-29 MED ORDER — CARVEDILOL 6.25 MG PO TABS
6.2500 mg | ORAL_TABLET | Freq: Two times a day (BID) | ORAL | Status: DC
  Administered 2019-01-29 – 2019-02-01 (×6): 6.25 mg via ORAL
  Filled 2019-01-29 (×7): qty 1

## 2019-01-29 MED ORDER — HYDRALAZINE HCL 20 MG/ML IJ SOLN: 10.0000 mg | Freq: Four times a day (QID) | INTRAMUSCULAR | Status: DC | PRN

## 2019-01-29 MED ORDER — TRAZODONE HCL 100 MG PO TABS: 200.0000 mg | ORAL_TABLET | Freq: Every day | ORAL | Status: DC

## 2019-01-29 MED ORDER — PREDNISONE 20 MG PO TABS
60.0000 mg | ORAL_TABLET | Freq: Once | ORAL | Status: AC
  Administered 2019-01-29: 60 mg via ORAL
  Filled 2019-01-29: qty 3

## 2019-01-29 MED ORDER — PANTOPRAZOLE SODIUM 40 MG PO TBEC
40.0000 mg | DELAYED_RELEASE_TABLET | Freq: Every day | ORAL | Status: DC
  Administered 2019-01-30 – 2019-02-01 (×3): 40 mg via ORAL
  Filled 2019-01-29 (×3): qty 1

## 2019-01-29 MED ORDER — IPRATROPIUM-ALBUTEROL 0.5-2.5 (3) MG/3ML IN SOLN
3.0000 mL | Freq: Four times a day (QID) | RESPIRATORY_TRACT | Status: DC
  Administered 2019-01-29: 3 mL via RESPIRATORY_TRACT
  Filled 2019-01-29: qty 3

## 2019-01-29 MED ORDER — CLONAZEPAM 0.5 MG PO TABS
0.2500 mg | ORAL_TABLET | Freq: Two times a day (BID) | ORAL | Status: DC | PRN
  Administered 2019-01-30: 0.25 mg via ORAL
  Filled 2019-01-29: qty 1

## 2019-01-29 MED ORDER — ONDANSETRON HCL 4 MG/2ML IJ SOLN
4.0000 mg | Freq: Four times a day (QID) | INTRAMUSCULAR | Status: DC | PRN
  Administered 2019-01-30 – 2019-01-31 (×2): 4 mg via INTRAVENOUS
  Filled 2019-01-29 (×2): qty 2

## 2019-01-29 MED ORDER — ORAL CARE MOUTH RINSE
15.0000 mL | Freq: Two times a day (BID) | OROMUCOSAL | Status: DC
  Administered 2019-01-29 – 2019-01-30 (×2): 15 mL via OROMUCOSAL

## 2019-01-29 MED ORDER — GUAIFENESIN ER 600 MG PO TB12
1200.0000 mg | ORAL_TABLET | Freq: Two times a day (BID) | ORAL | Status: DC
  Administered 2019-01-29 – 2019-02-01 (×6): 1200 mg via ORAL
  Filled 2019-01-29 (×6): qty 2

## 2019-01-29 MED ORDER — SODIUM CHLORIDE 0.9 % IV SOLN: INTRAVENOUS | Status: DC | PRN

## 2019-01-29 MED ORDER — SODIUM CHLORIDE 0.9 % IV SOLN
1.0000 g | INTRAVENOUS | Status: DC
  Administered 2019-01-30 – 2019-01-31 (×2): 1 g via INTRAVENOUS
  Filled 2019-01-29 (×2): qty 1

## 2019-01-29 MED ORDER — SODIUM CHLORIDE 0.9 % IV SOLN
500.0000 mg | Freq: Once | INTRAVENOUS | Status: AC
  Administered 2019-01-29: 500 mg via INTRAVENOUS
  Filled 2019-01-29: qty 500

## 2019-01-29 MED ORDER — SODIUM CHLORIDE 3 % IN NEBU
4.0000 mL | INHALATION_SOLUTION | Freq: Two times a day (BID) | RESPIRATORY_TRACT | Status: DC
  Administered 2019-01-30: 15 mL via RESPIRATORY_TRACT
  Administered 2019-01-30 – 2019-01-31 (×2): 4 mL via RESPIRATORY_TRACT
  Administered 2019-01-31: 15 mL via RESPIRATORY_TRACT
  Administered 2019-02-01: 4 mL via RESPIRATORY_TRACT
  Filled 2019-01-29 (×6): qty 4

## 2019-01-29 MED ORDER — ALBUTEROL SULFATE (2.5 MG/3ML) 0.083% IN NEBU
5.0000 mg | INHALATION_SOLUTION | Freq: Once | RESPIRATORY_TRACT | Status: AC
  Administered 2019-01-29: 5 mg via RESPIRATORY_TRACT
  Filled 2019-01-29: qty 6

## 2019-01-29 MED ORDER — FUROSEMIDE 10 MG/ML IJ SOLN
40.0000 mg | Freq: Every day | INTRAMUSCULAR | Status: DC
  Administered 2019-01-29: 40 mg via INTRAVENOUS
  Filled 2019-01-29: qty 4

## 2019-01-29 MED ORDER — ENOXAPARIN SODIUM 40 MG/0.4ML ~~LOC~~ SOLN
40.0000 mg | SUBCUTANEOUS | Status: DC
  Administered 2019-01-29 – 2019-01-31 (×3): 40 mg via SUBCUTANEOUS
  Filled 2019-01-29 (×3): qty 0.4

## 2019-01-29 NOTE — Progress Notes (Signed)
[name] is a 85 y.o. female with the following history as recorded in EpicCare:  Patient Active Problem List   Diagnosis Date Noted  . Anxiety 08/26/2018  . Seizure ([place]) 08/25/2018  . GERD (gastroesophageal reflux disease) 08/25/2018  . Chronic diastolic CHF (congestive heart failure) ([place]) 08/25/2018  . Hypercalcemia 08/25/2018  . Common bile duct dilatation 07/09/2018  . Hypomagnesemia 12/18/2017  . Major depressive disorder, recurrent episode with anxious distress ([place]) 02/04/2017  . Dementia arising in the senium and presenium ([place]) 06/27/2016  . Obesity, Class II, BMI 35-39.9, with comorbidity 05/15/2016  . Insomnia w/ sleep apnea 10/16/2015  . IBS (irritable bowel syndrome) 02/27/2015  . Hashimoto's thyroiditis 05/05/2014  . Adrenal incidentaloma ([place]) 11/04/2013  . Benign paroxysmal positional vertigo 07/15/2013  . Aortic stenosis, mild 07/14/2013  . OAB (overactive bladder) 02/24/2013  . GERD with stricture 12/24/2012  . Sleep-related hypoventilation 07/30/2012  . OSA (obstructive sleep apnea) 04/16/2012  . Osteopenia 04/16/2012  . Preventative health care 05/24/2011  . Vitamin D deficiency 12/04/2010  . Hyperlipidemia with target LDL less than 130 12/03/2010  . GAD (generalized anxiety disorder) 12/03/2010  . Essential hypertension 12/03/2010  . Osteoarthritis 12/03/2010    Current Outpatient Medications  Medication Sig Dispense Refill  . acetaminophen (TYLENOL) 325 MG tablet Take 2 tablets (650 mg total) by mouth every 6 (six) hours as needed for moderate pain.    . calcium carbonate (OS-CAL) 600 MG TABS tablet Take 600 mg by mouth 2 (two) times daily with a meal.    . carvedilol (COREG) 6.25 MG tablet TAKE 1 TABLET BY MOUTH TWICE A DAY WITH MEALS 180 tablet 1  . chlorthalidone (HYGROTON) 25 MG tablet TAKE 1 TABLET BY MOUTH EVERY DAY 90 tablet 1  . Cholecalciferol 2000 units TABS Take 2 tablets (4,000 Units total) by mouth daily. 180 tablet 1  . clonazePAM  (KLONOPIN) 1 MG tablet TAKE 1 TABLET BY MOUTH TWICE DAILY AS NEEDED FOR ANXIETY 60 tablet 3  . gabapentin (NEURONTIN) 100 MG capsule Take 1 capsule (100 mg total) by mouth 3 (three) times daily. 90 capsule 3  . MYRBETRIQ 25 MG TB24 tablet TAKE 1 TABLET BY MOUTH EVERY DAY 90 tablet 1  . omeprazole (PRILOSEC) 40 MG capsule TAKE 1 CAPSULE BY MOUTH EVERY DAY 90 capsule 1  . traZODone (DESYREL) 100 MG tablet TAKE 2 TABLETS BY MOUTH AT BEDTIME 180 tablet 1   No current facility-administered medications for this visit.     Allergies: Amlodipine; Enalapril; Losartan potassium; Morphine and related; and Metformin  Past Medical History:  Diagnosis Date  . Abdominal pain   . Anemia    NOS  . Anxiety   . Back pain   . Bruises easily   . Chronic diastolic CHF (congestive heart failure) ([place])    a. 06/2013 EF 65-70%.  . Chronic headaches    HISTORY OF   . Depression   . Diabetes mellitus    type II  DIET CONTROLLED  . Diarrhea   . Hepatitis    AGE 30S  . Hyperlipidemia   . Hypertension   . Mild aortic stenosis    a. 06/2013 Echo: EF 65-70%, mod LVH with focal basal hypertrophy, very mild AS.  Marland Kitchen Osteoarthritis   . Oxygen desaturation during sleep    USES 2 LITERS BEDTIME   VIA CPAP 06/2012 [facility]   . Shortness of breath    WITH EXERTION USES 2 L O2 BEDTIME  . Sleep apnea  CPAP WITH O2 2 LITERS 2013 ([facility])  . Wears dentures   . Wears glasses     Past Surgical History:  Procedure Laterality Date  . ABDOMINAL HYSTERECTOMY    . APPENDECTOMY    . CHOLECYSTECTOMY    . JOINT REPLACEMENT     LEFT KNEE   . KNEE ARTHROPLASTY Right 03/22/2015   Procedure: COMPUTER ASSISTED TOTAL KNEE ARTHROPLASTY;  Surgeon: Marybelle Killings, MD;  Location: [place];  Service: Orthopedics;  Laterality: Right;  . KNEE ARTHROSCOPY     RIGHT  . LEFT AND RIGHT HEART CATHETERIZATION WITH CORONARY ANGIOGRAM N/A 10/28/2013   Procedure: LEFT AND RIGHT HEART CATHETERIZATION WITH CORONARY ANGIOGRAM;  Surgeon: Blane Ohara, MD;  Location[facility] C[facility] LAB;  Service: Cardiovascular;  Laterality: N/A;  . LEFT HEART CATHETERIZATION WITH CORONARY ANGIOGRAM N/A 11/14/2014   Procedure: LEFT HEART CATHETERIZATION WITH CORONARY ANGIOGRAM;  Surgeon: Josue Hector, MD;  Location: [facility] CATH LAB;  Service: Cardiovascular;  Laterality: N/A;  . REVISION TOTAL KNEE ARTHROPLASTY  2011  . ROTATOR CUFF REPAIR  2010  . SHOULDER ARTHROSCOPY  09/17/2012   Procedure: ARTHROSCOPY SHOULDER;  Surgeon: Sharmon Revere, MD;  Location: [place];  Service: Orthopedics;  Laterality: Left;  . TONSILLECTOMY    . TONSILLECTOMY      Family History  Problem Relation Age of Onset  . Cancer Father        prostate cancer  . Heart disease Sister   . Heart disease Brother   . Alcohol abuse Other   . Hypertension Other   . Kidney disease Other   . Mental illness Other   . Emphysema Brother     Social History   Tobacco Use  . Smoking status: Never Smoker  . Smokeless tobacco: Never Used  Substance Use Topics  . Alcohol use: No    Subjective:  Patient is brought to the office with her daughter; 1-2 day history of weakness, congestion/ shortness of breath; does mention that her back has been hurting but denies any specific chest pain; does not wear oxygen but O2 level noted to be 82% at time of check-in; not sure if she has been running any type of fever.   Objective:  Vitals:   01/29/19 1351  BP: 140/78  Pulse: 77  Temp: 98.3 F (36.8 C)  TempSrc: Oral  SpO2: (!) 82%  Weight: 224 lb (101.6 kg)  Height: 5\' 8"  (1.727 m)    General: Well developed, well nourished, in no acute distress  Skin : Warm and dry.  Head: Normocephalic and atraumatic  Eyes: Sclera and conjunctiva clear; pupils round and reactive to light; extraocular movements intact  Ears: External normal; canals clear; tympanic membranes normal  Oropharynx: Pink, supple. No suspicious lesions  Neck: Supple without thyromegaly, adenopathy  Lungs: Respirations unlabored;  audible wheezing; decreased breath sounds CVS exam: normal rate and regular rhythm.  Neurologic: Alert and oriented; speech intact; face symmetrical; moves all extremities well; CNII-XII intact without focal deficit   Assessment:  1. Shortness of breath   2. Hypoxia     Plan:  During course of visit, patient was placed on oxygen; o2 sats during OV while on oxygen were up to 96%; discussed concern for presenting symptoms with patient and daughter, feel she needs to go to ER; patient and daughter defer going by ambulance; charge nurse at [employer];  No follow-ups on file.  No orders of the defined types were placed in this encounter.  Requested Prescriptions    No prescriptions requested or ordered in this encounter

## 2019-01-29 NOTE — ED Notes (Signed)
Patient ambulated to RR with assistance.

## 2019-01-29 NOTE — ED Provider Notes (Addendum)
[facility] DEPT Provider Note   CSN: [id number] Arrival date & time: 01/29/19  1422    History   Chief Complaint Chief Complaint  Patient presents with  . Shortness of Breath    HPI [name] is a 85 y.o. female.     85 yo F with a chief complaint of shortness of breath.  Patient has had chest pain shortness of breath going for the past month.  She thinks that her gabapentin is causing her chest pain or shortness of breath.  She is trying to stop taking it and has been cutting down on the dose.  Over the past week and a half she has had cough and congestion.  She went to see her family doctor today and was noted to have an oxygen saturation in the low 80s.  She was sent here for further evaluation.  Patient denies fevers or chills.  She has had some increased urination.  Denies dysuria or hesitancy.  Denies flank pain.  The history is provided by the patient.  Shortness of Breath  Severity:  Moderate Onset quality:  Gradual Duration:  2 days Timing:  Constant Progression:  Worsening Chronicity:  New Relieved by:  Nothing Worsened by:  Nothing Ineffective treatments:  None tried Associated symptoms: no chest pain, no fever, no headaches, no vomiting and no wheezing     Past Medical History:  Diagnosis Date  . Abdominal pain   . Anemia    NOS  . Anxiety   . Back pain   . Bruises easily   . Chronic diastolic CHF (congestive heart failure) ([place])    a. 06/2013 EF 65-70%.  . Chronic headaches    HISTORY OF   . Depression   . Diabetes mellitus    type II  DIET CONTROLLED  . Diarrhea   . Hepatitis    AGE 30S  . Hyperlipidemia   . Hypertension   . Mild aortic stenosis    a. 06/2013 Echo: EF 65-70%, mod LVH with focal basal hypertrophy, very mild AS.  Marland Kitchen Osteoarthritis   . Oxygen desaturation during sleep    USES 2 LITERS BEDTIME   VIA CPAP 06/2012 [facility]   . Shortness of breath    WITH EXERTION USES 2 L O2 BEDTIME  . Sleep  apnea    CPAP WITH O2 2 LITERS 2013 ([facility])  . Wears dentures   . Wears glasses     Patient Active Problem List   Diagnosis Date Noted  . CAP (community acquired pneumonia) 01/29/2019  . Anxiety 08/26/2018  . Seizure ([place]) 08/25/2018  . GERD (gastroesophageal reflux disease) 08/25/2018  . Chronic diastolic CHF (congestive heart failure) ([place]) 08/25/2018  . Hypercalcemia 08/25/2018  . Common bile duct dilatation 07/09/2018  . Hypomagnesemia 12/18/2017  . Major depressive disorder, recurrent episode with anxious distress ([place]) 02/04/2017  . Dementia arising in the senium and presenium ([place]) 06/27/2016  . Obesity, Class II, BMI 35-39.9, with comorbidity 05/15/2016  . Insomnia w/ sleep apnea 10/16/2015  . IBS (irritable bowel syndrome) 02/27/2015  . Hashimoto's thyroiditis 05/05/2014  . Adrenal incidentaloma ([place]) 11/04/2013  . Benign paroxysmal positional vertigo 07/15/2013  . Aortic stenosis, mild 07/14/2013  . OAB (overactive bladder) 02/24/2013  . GERD with stricture 12/24/2012  . Sleep-related hypoventilation 07/30/2012  . OSA (obstructive sleep apnea) 04/16/2012  . Osteopenia 04/16/2012  . Preventative health care 05/24/2011  . Vitamin D deficiency 12/04/2010  . Hyperlipidemia with target LDL less than 130  12/03/2010  . GAD (generalized anxiety disorder) 12/03/2010  . Essential hypertension 12/03/2010  . Osteoarthritis 12/03/2010    Past Surgical History:  Procedure Laterality Date  . ABDOMINAL HYSTERECTOMY    . APPENDECTOMY    . CHOLECYSTECTOMY    . JOINT REPLACEMENT     LEFT KNEE   . KNEE ARTHROPLASTY Right 03/22/2015   Procedure: COMPUTER ASSISTED TOTAL KNEE ARTHROPLASTY;  Surgeon: Marybelle Killings, MD;  Location: [facility];  Service: Orthopedics;  Laterality: Right;  . KNEE ARTHROSCOPY     RIGHT  . LEFT AND RIGHT HEART CATHETERIZATION WITH CORONARY ANGIOGRAM N/A 10/28/2013   Procedure: LEFT AND RIGHT HEART CATHETERIZATION WITH CORONARY ANGIOGRAM;  Surgeon: Blane Ohara,  MD;  Location: [facility] CATH LAB;  Service: Cardiovascular;  Laterality: N/A;  . LEFT HEART CATHETERIZATION WITH CORONARY ANGIOGRAM N/A 11/14/2014   Procedure: LEFT HEART CATHETERIZATION WITH CORONARY ANGIOGRAM;  Surgeon: Josue Hector, MD;  Location: [facility] CATH LAB;  Service: Cardiovascular;  Laterality: N/A;  . REVISION TOTAL KNEE ARTHROPLASTY  2011  . ROTATOR CUFF REPAIR  2010  . SHOULDER ARTHROSCOPY  09/17/2012   Procedure: ARTHROSCOPY SHOULDER;  Surgeon: Sharmon Revere, MD;  Location: [facility];  Service: Orthopedics;  Laterality: Left;  . TONSILLECTOMY    . TONSILLECTOMY       OB History   No obstetric history on file.      Home Medications    Prior to Admission medications   Medication Sig Start Date End Date Taking? Authorizing Provider  acetaminophen (TYLENOL) 325 MG tablet Take 2 tablets (650 mg total) by mouth every 6 (six) hours as needed for moderate pain. 08/28/18  Yes Domenic Polite, MD  calcium carbonate (OS-CAL) 600 MG TABS tablet Take 600 mg by mouth 2 (two) times daily with a meal.   Yes [provider]  carvedilol (COREG) 6.25 MG tablet TAKE 1 TABLET BY MOUTH TWICE A DAY WITH MEALS Patient taking differently: Take 6.25 mg by mouth 2 (two) times daily with a meal.  07/13/18  Yes [name], MD  chlorthalidone (HYGROTON) 25 MG tablet TAKE 1 TABLET BY MOUTH EVERY DAY Patient taking differently: Take 25 mg by mouth daily.  10/07/18  Yes [name], MD  Cholecalciferol 2000 units TABS Take 2 tablets (4,000 Units total) by mouth daily. 03/31/18  Yes [name], MD  clonazePAM (KLONOPIN) 1 MG tablet TAKE 1 TABLET BY MOUTH TWICE DAILY AS NEEDED FOR ANXIETY Patient taking differently: Take 1 mg by mouth 2 (two) times daily as needed for anxiety.  12/23/18  Yes [name], MD  gabapentin (NEURONTIN) 100 MG capsule Take 1 capsule (100 mg total) by mouth 3 (three) times daily. 01/05/19  Yes [name], MD  MYRBETRIQ 25 MG TB24 tablet TAKE 1 TABLET BY MOUTH  EVERY DAY Patient taking differently: Take 25 mg by mouth daily.  11/09/18  Yes [name], MD  omeprazole (PRILOSEC) 40 MG capsule TAKE 1 CAPSULE BY MOUTH EVERY DAY Patient taking differently: Take 40 mg by mouth daily.  12/12/18  Yes [name], MD  Phenylephrine-Pheniramine-DM [facility] COLD & COUGH PO) Take 1 tablet by mouth every 6 (six) hours as needed (cold and cough).   Yes [provider]  Throat Lozenges (COUGH DROPS MT) Use as directed 1 lozenge in the mouth or throat 2 (two) times daily as needed (cough).   Yes [provider]  traZODone (DESYREL) 100 MG tablet TAKE 2 TABLETS BY MOUTH AT  BEDTIME Patient taking differently: Take 200 mg by mouth at bedtime.  12/27/18  Yes [name], MD    Family History Family History  Problem Relation Age of Onset  . Cancer Father        prostate cancer  . Heart disease Sister   . Heart disease Brother   . Alcohol abuse Other   . Hypertension Other   . Kidney disease Other   . Mental illness Other   . Emphysema Brother     Social History Social History   Tobacco Use  . Smoking status: Never Smoker  . Smokeless tobacco: Never Used  Substance Use Topics  . Alcohol use: No  . Drug use: No     Allergies   Amlodipine; Enalapril; Losartan potassium; Morphine and related; and Metformin   Review of Systems Review of Systems  Constitutional: Negative for chills and fever.  HENT: Positive for congestion. Negative for rhinorrhea.   Eyes: Negative for redness and visual disturbance.  Respiratory: Positive for choking and shortness of breath. Negative for wheezing.   Cardiovascular: Negative for chest pain and palpitations.  Gastrointestinal: Negative for nausea and vomiting.  Genitourinary: Negative for dysuria and urgency.  Musculoskeletal: Negative for arthralgias and myalgias.  Skin: Negative for pallor and wound.  Neurological: Negative for dizziness and headaches.     Physical Exam Updated  Vital Signs BP (!) 176/93 (BP Location: Right Arm)   Pulse 71   Temp 98.1 F (36.7 C) (Oral)   Resp (!) 22   Ht 5\' 6"  (1.676 m)   Wt 100.9 kg   SpO2 94%   BMI 35.90 kg/m   Physical Exam Vitals signs and nursing note reviewed.  Constitutional:      General: She is not in acute distress.    Appearance: She is well-developed. She is obese. She is not diaphoretic.  HENT:     Head: Normocephalic and atraumatic.  Eyes:     Pupils: Pupils are equal, round, and reactive to light.  Neck:     Musculoskeletal: Normal range of motion and neck supple.  Cardiovascular:     Rate and Rhythm: Normal rate and regular rhythm.     Heart sounds: No murmur. No friction rub. No gallop.   Pulmonary:     Effort: Pulmonary effort is normal.     Breath sounds: No wheezing or rales.  Abdominal:     General: There is no distension.     Palpations: Abdomen is soft.     Tenderness: There is no abdominal tenderness.  Musculoskeletal:        General: No tenderness.  Skin:    General: Skin is warm and dry.  Neurological:     Mental Status: She is alert and oriented to person, place, and time.  Psychiatric:        Behavior: Behavior normal.      ED Treatments / Results  Labs (all labs ordered are listed, but only abnormal results are displayed) Labs Reviewed  CBC WITH DIFFERENTIAL/PLATELET - Abnormal; Notable for the following components:      Result Value   WBC 11.4 (*)    MCV 101.0 (*)    MCHC 29.0 (*)    Platelets 147 (*)    Neutro Abs 7.8 (*)    All other components within normal limits  BRAIN NATRIURETIC PEPTIDE - Abnormal; Notable for the following components:   B Natriuretic Peptide 144.2 (*)    All other components within normal limits  URINALYSIS, ROUTINE  W REFLEX MICROSCOPIC - Abnormal; Notable for the following components:   APPearance CLOUDY (*)    Hgb urine dipstick MODERATE (*)    Protein, ur TRACE (*)    Leukocytes,Ua MODERATE (*)    All other components within normal  limits  BASIC METABOLIC PANEL - Abnormal; Notable for the following components:   Chloride 96 (*)    CO2 36 (*)    Calcium 8.7 (*)    All other components within normal limits  URINALYSIS, MICROSCOPIC (REFLEX) - Abnormal; Notable for the following components:   Bacteria, UA FEW (*)    All other components within normal limits  GROUP A STREP BY PCR  CULTURE, BLOOD (ROUTINE X 2)  CULTURE, BLOOD (ROUTINE X 2)  RESPIRATORY PANEL BY PCR  EXPECTORATED SPUTUM ASSESSMENT W REFEX TO RESP CULTURE  INFLUENZA PANEL BY PCR (TYPE A & B)  PROCALCITONIN  CBC WITH DIFFERENTIAL/PLATELET  LACTIC ACID, PLASMA  BASIC METABOLIC PANEL  PHOSPHORUS  MAGNESIUM  I-STAT TROPONIN, ED    EKG EKG Interpretation  Date/Time:  Friday January 29 2019 14:31:57 EST Ventricular Rate:  74 PR Interval:    QRS Duration: 89 QT Interval:  358 QTC Calculation: 398 R Axis:   62 Text Interpretation:  Sinus rhythm Anteroseptal infarct, old Borderline repolarization abnormality Baseline wander in lead(s) II III aVL aVF TECHNICALLY DIFFICULT No significant change since last tracing Confirmed by Deno Etienne [phone]) on 01/29/2019 3:03:50 PM   Radiology Dg Chest 2 View  Result Date: 01/29/2019 CLINICAL DATA:  Shortness of breath and hypoxia EXAM: CHEST - 2 VIEW COMPARISON:  08/25/2018 FINDINGS: Mild cardiomegaly with central pulmonary vascular congestion. No overt edema. No pleural effusion or pneumothorax. Mildly increased opacity at the right costophrenic angle. IMPRESSION: 1. Increased opacity at the right costophrenic angle may indicate developing consolidation or atelectasis. 2. Mild cardiomegaly with central pulmonary vascular congestion. Electronically Signed   By: Ulyses Jarred M.D.   On: 01/29/2019 16:12    Procedures Procedures (including critical care time)  Medications Ordered in ED Medications  azithromycin (ZITHROMAX) 500 mg in sodium chloride 0.9 % 250 mL IVPB (has no administration in time range)  cefTRIAXone  (ROCEPHIN) 1 g in sodium chloride 0.9 % 100 mL IVPB (has no administration in time range)  sodium chloride HYPERTONIC 3 % nebulizer solution 4 mL (4 mLs Nebulization Not Given 01/29/19 2037)  guaiFENesin (MUCINEX) 12 hr tablet 1,200 mg (1,200 mg Oral Given 01/29/19 2106)  enoxaparin (LOVENOX) injection 40 mg (40 mg Subcutaneous Given 01/29/19 2114)  furosemide (LASIX) injection 40 mg (40 mg Intravenous Given 01/29/19 2112)  carvedilol (COREG) tablet 6.25 mg (6.25 mg Oral Given 01/29/19 2109)  clonazePAM (KLONOPIN) tablet 0.25 mg (has no administration in time range)  gabapentin (NEURONTIN) capsule 100 mg (100 mg Oral Given 01/29/19 2107)  mirabegron ER (MYRBETRIQ) tablet 25 mg (has no administration in time range)  pantoprazole (PROTONIX) EC tablet 40 mg (has no administration in time range)  traZODone (DESYREL) tablet 50 mg (50 mg Oral Given 01/29/19 2107)  acetaminophen (TYLENOL) tablet 650 mg (has no administration in time range)  ondansetron (ZOFRAN) injection 4 mg (has no administration in time range)  hydrALAZINE (APRESOLINE) injection 10 mg (has no administration in time range)  MEDLINE mouth rinse (15 mLs Mouth Rinse Given 01/29/19 2115)  0.9 %  sodium chloride infusion (has no administration in time range)  ipratropium-albuterol (DUONEB) 0.5-2.5 (3) MG/3ML nebulizer solution 3 mL (has no administration in time range)  albuterol (PROVENTIL) (2.5 MG/3ML) 0.083%  nebulizer solution 5 mg (5 mg Nebulization Given 01/29/19 1501)  predniSONE (DELTASONE) tablet 60 mg (60 mg Oral Given 01/29/19 1614)  cefTRIAXone (ROCEPHIN) 1 g in sodium chloride 0.9 % 100 mL IVPB (0 g Intravenous Stopped 01/29/19 1730)  azithromycin (ZITHROMAX) 500 mg in sodium chloride 0.9 % 250 mL IVPB (0 mg Intravenous Stopped 01/29/19 1840)     Initial Impression / Assessment and Plan / ED Course  I have reviewed the triage vital signs and the nursing notes.  Pertinent labs & imaging results that were available during my care of the patient  were reviewed by me and considered in my medical decision making (see chart for details).        85 yo F with a chief complaint of cough congestion and shortness of breath.  Patient is hypoxic here in the ED down to 85% on room air.  Improved significantly with nasal cannula.  Does not appear dyspneic at all on my exam, clear lung sounds.  She did receive breathing treatment prior to my evaluation.  She thinks that that has helped her significantly.  We will give a dose of steriods.  Obtain cxr.   Chest x-ray with worsening consolidation in the right lower lobe.  Will start on community-acquired pneumonia antibiotics.  As she is requiring oxygen will discuss with the hospitalist.  CRITICAL CARE Performed by: Cecilio Asper   Total critical care time: 35 minutes  Critical care time was exclusive of separately billable procedures and treating other patients.  Critical care was necessary to treat or prevent imminent or life-threatening deterioration.  Critical care was time spent personally by me on the following activities: development of treatment plan with patient and/or surrogate as well as nursing, discussions with consultants, evaluation of patient's response to treatment, examination of patient, obtaining history from patient or surrogate, ordering and performing treatments and interventions, ordering and review of laboratory studies, ordering and review of radiographic studies, pulse oximetry and re-evaluation of patient's condition.   The patients results and plan were reviewed and discussed.   Any x-rays performed were independently reviewed by myself.   Differential diagnosis were considered with the presenting HPI.  Medications  azithromycin (ZITHROMAX) 500 mg in sodium chloride 0.9 % 250 mL IVPB (has no administration in time range)  cefTRIAXone (ROCEPHIN) 1 g in sodium chloride 0.9 % 100 mL IVPB (has no administration in time range)  sodium chloride HYPERTONIC 3 %  nebulizer solution 4 mL (4 mLs Nebulization Not Given 01/29/19 2037)  guaiFENesin (MUCINEX) 12 hr tablet 1,200 mg (1,200 mg Oral Given 01/29/19 2106)  enoxaparin (LOVENOX) injection 40 mg (40 mg Subcutaneous Given 01/29/19 2114)  furosemide (LASIX) injection 40 mg (40 mg Intravenous Given 01/29/19 2112)  carvedilol (COREG) tablet 6.25 mg (6.25 mg Oral Given 01/29/19 2109)  clonazePAM (KLONOPIN) tablet 0.25 mg (has no administration in time range)  gabapentin (NEURONTIN) capsule 100 mg (100 mg Oral Given 01/29/19 2107)  mirabegron ER (MYRBETRIQ) tablet 25 mg (has no administration in time range)  pantoprazole (PROTONIX) EC tablet 40 mg (has no administration in time range)  traZODone (DESYREL) tablet 50 mg (50 mg Oral Given 01/29/19 2107)  acetaminophen (TYLENOL) tablet 650 mg (has no administration in time range)  ondansetron (ZOFRAN) injection 4 mg (has no administration in time range)  hydrALAZINE (APRESOLINE) injection 10 mg (has no administration in time range)  MEDLINE mouth rinse (15 mLs Mouth Rinse Given 01/29/19 2115)  0.9 %  sodium chloride infusion (has no administration in  time range)  ipratropium-albuterol (DUONEB) 0.5-2.5 (3) MG/3ML nebulizer solution 3 mL (has no administration in time range)  albuterol (PROVENTIL) (2.5 MG/3ML) 0.083% nebulizer solution 5 mg (5 mg Nebulization Given 01/29/19 1501)  predniSONE (DELTASONE) tablet 60 mg (60 mg Oral Given 01/29/19 1614)  cefTRIAXone (ROCEPHIN) 1 g in sodium chloride 0.9 % 100 mL IVPB (0 g Intravenous Stopped 01/29/19 1730)  azithromycin (ZITHROMAX) 500 mg in sodium chloride 0.9 % 250 mL IVPB (0 mg Intravenous Stopped 01/29/19 1840)    Vitals:   01/29/19 2007 01/29/19 2008 01/29/19 2010 01/29/19 2037  BP:   (!) 176/93   Pulse:   71   Resp:   (!) 22   Temp:   98.1 F (36.7 C)   TempSrc:   Oral   SpO2:   94% 94%  Weight: 100.9 kg     Height:  5\' 6"  (1.676 m)      Final diagnoses:  Community acquired pneumonia of right lower lobe of lung ([place])     Admission/ observation were discussed with the admitting physician, patient and/or family and they are comfortable with the plan.    Final Clinical Impressions(s) / ED Diagnoses   Final diagnoses:  Community acquired pneumonia of right lower lobe of lung [facility])    ED Discharge Orders    None       Deno Etienne, DO 01/29/19 2217    Deno Etienne, DO 02/09/19 1535

## 2019-01-29 NOTE — H&P (Signed)
History and Physical  [name] ZOX:[id number] DOB: Jan 12, 1933 DOA: 01/29/2019  Referring physician: Dr Tyrone Nine PCP: [name], MD  Outpatient Specialists: None Patient coming from: Home  Chief Complaint: Cough and shortness of breath  HPI: [name] is a 85 y.o. female with medical history significant for hypertension, chronic anxiety, GERD, obesity who presented to [facility] ED referred by her primary care provider due to hypoxia.  Patient went to see her primary care provider today due to cough and shortness of breath of one-week duration.  Denies any sick contacts, tobacco use, or recent lengthy trips.  Associated with intermittent nausea and vomiting once 2 weeks prior.  Also associated with sudden onset sore throat.  In her PCPs office patient was found to be hypoxic 82% on room air.  Not on oxygen supplementation at baseline.  Due to concern for pneumonia PCP advised to go to the ED for further evaluation.  Patient states she has never been diagnosed with pneumonia in the past.  Denies any chest pain.  TRH asked to admit.  ED Course: Upon presentation to the ED, vitals signs remarkable for oxygen saturation 82% on room air.  Lab studies remarkable for mildly elevated WBC 11.4 and elevated neutrophil count.  Chest x-ray independently reviewed revealed bilateral lower lobe infiltrates versus atelectasis.  Patient was placed on oxygen supplementation and started on empiric IV antibiotics for community-acquired pneumonia in the ED.  Review of Systems: Review of systems as noted in the HPI. All other systems reviewed and are negative.   Past Medical History:  Diagnosis Date  . Abdominal pain   . Anemia    NOS  . Anxiety   . Back pain   . Bruises easily   . Chronic diastolic CHF (congestive heart failure) ([place])    a. 06/2013 EF 65-70%.  . Chronic headaches    HISTORY OF   . Depression   . Diabetes mellitus    type II  DIET CONTROLLED  . Diarrhea   . Hepatitis    AGE 30S  .  Hyperlipidemia   . Hypertension   . Mild aortic stenosis    a. 06/2013 Echo: EF 65-70%, mod LVH with focal basal hypertrophy, very mild AS.  Marland Kitchen Osteoarthritis   . Oxygen desaturation during sleep    USES 2 LITERS BEDTIME   [facility] CPAP 06/2012 [facility]   . Shortness of breath    WITH EXERTION USES 2 L O2 BEDTIME  . Sleep apnea    CPAP WITH O2 2 LITERS 2013 ([facility])  . Wears dentures   . Wears glasses    Past Surgical History:  Procedure Laterality Date  . ABDOMINAL HYSTERECTOMY    . APPENDECTOMY    . CHOLECYSTECTOMY    . JOINT REPLACEMENT     LEFT KNEE   . KNEE ARTHROPLASTY Right 03/22/2015   Procedure: COMPUTER ASSISTED TOTAL KNEE ARTHROPLASTY;  Surgeon: Marybelle Killings, MD;  Location: [place];  Service: Orthopedics;  Laterality: Right;  . KNEE ARTHROSCOPY     RIGHT  . LEFT AND RIGHT HEART CATHETERIZATION WITH CORONARY ANGIOGRAM N/A 10/28/2013   Procedure: LEFT AND RIGHT HEART CATHETERIZATION WITH CORONARY ANGIOGRAM;  Surgeon: Blane Ohara, MD;  Location: [facility] CATH LAB;  Service: Cardiovascular;  Laterality: N/A;  . LEFT HEART CATHETERIZATION WITH CORONARY ANGIOGRAM N/A 11/14/2014   Procedure: LEFT HEART CATHETERIZATION WITH CORONARY ANGIOGRAM;  Surgeon: Josue Hector, MD;  Location: [facility] CATH LAB;  Service: Cardiovascular;  Laterality: N/A;  .  REVISION TOTAL KNEE ARTHROPLASTY  2011  . ROTATOR CUFF REPAIR  2010  . SHOULDER ARTHROSCOPY  09/17/2012   Procedure: ARTHROSCOPY SHOULDER;  Surgeon: Sharmon Revere, MD;  Location: [place];  Service: Orthopedics;  Laterality: Left;  . TONSILLECTOMY    . TONSILLECTOMY      Social History:  reports that she has never smoked. She has never used smokeless tobacco. She reports that she does not drink alcohol or use drugs.   Allergies  Allergen Reactions  . Amlodipine Other (See Comments)    Reaction:  Dizziness   . Enalapril Cough  . Losartan Potassium Other (See Comments)    Reaction:  GI pain   . Morphine And Related Itching and Nausea And  Vomiting  . Metformin Diarrhea    Family History  Problem Relation Age of Onset  . Cancer Father        prostate cancer  . Heart disease Sister   . Heart disease Brother   . Alcohol abuse Other   . Hypertension Other   . Kidney disease Other   . Mental illness Other   . Emphysema Brother       Prior to Admission medications   Medication Sig Start Date End Date Taking? Authorizing Provider  acetaminophen (TYLENOL) 325 MG tablet Take 2 tablets (650 mg total) by mouth every 6 (six) hours as needed for moderate pain. 08/28/18  Yes Domenic Polite, MD  calcium carbonate (OS-CAL) 600 MG TABS tablet Take 600 mg by mouth 2 (two) times daily with a meal.   Yes [provider]  carvedilol (COREG) 6.25 MG tablet TAKE 1 TABLET BY MOUTH TWICE A DAY WITH MEALS Patient taking differently: Take 6.25 mg by mouth 2 (two) times daily with a meal.  07/13/18  Yes [name], MD  chlorthalidone (HYGROTON) 25 MG tablet TAKE 1 TABLET BY MOUTH EVERY DAY Patient taking differently: Take 25 mg by mouth daily.  10/07/18  Yes [name], MD  Cholecalciferol 2000 units TABS Take 2 tablets (4,000 Units total) by mouth daily. 03/31/18  Yes [name], MD  clonazePAM (KLONOPIN) 1 MG tablet TAKE 1 TABLET BY MOUTH TWICE DAILY AS NEEDED FOR ANXIETY Patient taking differently: Take 1 mg by mouth 2 (two) times daily as needed for anxiety.  12/23/18  Yes [name], MD  gabapentin (NEURONTIN) 100 MG capsule Take 1 capsule (100 mg total) by mouth 3 (three) times daily. 01/05/19  Yes [name], MD  MYRBETRIQ 25 MG TB24 tablet TAKE 1 TABLET BY MOUTH EVERY DAY Patient taking differently: Take 25 mg by mouth daily.  11/09/18  Yes [name], MD  omeprazole (PRILOSEC) 40 MG capsule TAKE 1 CAPSULE BY MOUTH EVERY DAY Patient taking differently: Take 40 mg by mouth daily.  12/12/18  Yes [name], MD  Phenylephrine-Pheniramine-DM [facility] COLD & COUGH PO) Take 1 tablet by mouth every 6  (six) hours as needed (cold and cough).   Yes [provider]  Throat Lozenges (COUGH DROPS MT) Use as directed 1 lozenge in the mouth or throat 2 (two) times daily as needed (cough).   Yes [provider]  traZODone (DESYREL) 100 MG tablet TAKE 2 TABLETS BY MOUTH AT BEDTIME Patient taking differently: Take 200 mg by mouth at bedtime.  12/27/18  Yes [name], MD    Physical Exam: BP (!) 167/82   Pulse 77   Temp 98.1 F (36.7 C) (Oral)   Resp 16  SpO2 98%   . General: 85 y.o. year-old female well developed well nourished in no acute distress.  Alert and oriented x3. . Cardiovascular: Regular rate and rhythm with no rubs or gallops.  No thyromegaly or JVD noted.  No lower extremity edema. 2/4 pulses in all 4 extremities. Marland Kitchen Respiratory: Mild rales at bases with no wheezes.  Poor inspiratory effort. . Abdomen: Soft nontender nondistended with normal bowel sounds x4 quadrants. . Muskuloskeletal: No cyanosis, clubbing or edema noted bilaterally . Neuro: CN II-XII intact, strength, sensation, reflexes . Skin: No ulcerative lesions noted or rashes . Psychiatry: Judgement and insight appear normal. Mood is appropriate for condition and setting          Labs on Admission:  Basic Metabolic Panel: Recent Labs  Lab 01/29/19 1629  NA 139  K 4.8  CL 96*  CO2 36*  GLUCOSE 98  BUN 11  CREATININE 0.78  CALCIUM 8.7*   Liver Function Tests: No results for input(s): AST, ALT, ALKPHOS, BILITOT, PROT, ALBUMIN in the last 168 hours. No results for input(s): LIPASE, AMYLASE in the last 168 hours. No results for input(s): AMMONIA in the last 168 hours. CBC: Recent Labs  Lab 01/29/19 1528  WBC 11.4*  NEUTROABS 7.8*  HGB 12.1  HCT 41.7  MCV 101.0*  PLT 147*   Cardiac Enzymes: No results for input(s): CKTOTAL, CKMB, CKMBINDEX, TROPONINI in the last 168 hours.  BNP (last 3 results) Recent Labs    08/26/18 0116 01/29/19 1528  BNP 292.5* 144.2*    ProBNP (last  3 results) No results for input(s): PROBNP in the last 8760 hours.  CBG: No results for input(s): GLUCAP in the last 168 hours.  Radiological Exams on Admission: Dg Chest 2 View  Result Date: 01/29/2019 CLINICAL DATA:  Shortness of breath and hypoxia EXAM: CHEST - 2 VIEW COMPARISON:  08/25/2018 FINDINGS: Mild cardiomegaly with central pulmonary vascular congestion. No overt edema. No pleural effusion or pneumothorax. Mildly increased opacity at the right costophrenic angle. IMPRESSION: 1. Increased opacity at the right costophrenic angle may indicate developing consolidation or atelectasis. 2. Mild cardiomegaly with central pulmonary vascular congestion. Electronically Signed   By: Ulyses Jarred M.D.   On: 01/29/2019 16:12    EKG: I independently viewed the EKG done and my findings are as followed: Sinus rhythm with rate of 74 with no specific ST-T changes.  Assessment/Plan Present on Admission: . CAP (community acquired pneumonia)  Active Problems:   CAP (community acquired pneumonia)   Community-acquired pneumonia, poa Patient presented with symptoms of nonproductive cough, dyspnea, hypoxia with O2 saturation 82% on room air and a chest x-ray indicative of bibasilar pneumonia Continue IV azithromycin and Rocephin Obtain sputum culture Has reported intermittent nausea with vomiting once 2 weeks prior to presentation Obtain influenza a and B PCR Obtain respiratory viral panel Blood cultures x2 have been drawn in the ED and in process Has reported an onset sore throat Obtain group a strep PCR Start duo nebs every 6 hours and every 2 hours as needed Start flutter valve Start pulmonary toilet with Mucinex 1200 mg twice daily and hypersaline nebs twice daily Maintain O2 saturation greater than 92%  Acute hypoxic respiratory failure secondary to community-acquired pneumonia Not on O2 supplementation at baseline Never smoker and no exposure to secondhand smoking Presented with O2  saturation 82% on room air Management as stated above Home O2 evaluation prior to discharge  Chronic diastolic CHF Last 2D echo done on 10/28/2013 revealed LVEF 60 to  65% with grade 1 diastolic dysfunction We will obtain BNP Although appears euvolemic on exam Strict I's and O's and daily weight Resume Coreg, chlorthalidone  Hypertension Blood pressure is normotensive Continue Coreg, chlorthalidone Continue to monitor vital signs  Generalized weakness/physical debility PT to assess Fall precautions  Obesity BMI 34 Recommend weight loss outpatient when hemodynamically stable with regular physical activity and healthy dieting Follow-up with your PCP    DVT prophylaxis: Subcu Lovenox daily  Code Status: Full code as confirmed by patient and daughter in the room  Family Communication: Daughter in the room.  All questions answered to her satisfaction.  Disposition Plan: Admit as observation status to MedSurg unit  Consults called: None  Admission status: Observation status    Kayleen Memos MD [facility] Pager [phone]  If 7PM-7AM, please contact night-coverage [email or web address] Password [facility]  01/29/2019, 6:11 PM

## 2019-01-29 NOTE — ED Notes (Signed)
After breathing treatment was administered, patient was kept off O2 via [place], 30 seconds after her oxygen levels dropped into the high 80s and 2 L via [place] was replaced.

## 2019-01-29 NOTE — ED Notes (Signed)
Patient arrived back from X-ray.

## 2019-01-29 NOTE — ED Notes (Signed)
One set of blood cultures obtained, did not want to delay abx trt.

## 2019-01-29 NOTE — ED Notes (Signed)
Report given to Emily, RN.

## 2019-01-29 NOTE — ED Notes (Signed)
Coming from PCP-states patient weak-states O2 sats low-placed on O2 in office-family refused EMS transport

## 2019-01-29 NOTE — ED Triage Notes (Signed)
Pt states she has had URI symptoms x 1 week approximately and went to primary care with [place] which revealed that her O2 was 82%. 85% here. Denies chronic lung/breathing issues. Alert and oriented. Denies Chest pain.

## 2019-01-29 NOTE — ED Notes (Signed)
ED TO INPATIENT HANDOFF REPORT   Name/Age/Gender [name] 85 y.o. female Room/Bed: WA04/WA04  Code Status   Code Status: Prior  Home/SNF/Other Home Patient oriented to: self, place, time and situation Is this baseline? Yes   Triage Complete: Triage complete  Chief Complaint hypoxic  Triage Note Pt states she has had URI symptoms x 1 week approximately and went to primary care with [place] which revealed that her O2 was 82%. 85% here. Denies chronic lung/breathing issues. Alert and oriented. Denies Chest pain.    Allergies Allergies  Allergen Reactions  . Amlodipine Other (See Comments)    Reaction:  Dizziness   . Enalapril Cough  . Losartan Potassium Other (See Comments)    Reaction:  GI pain   . Morphine And Related Itching and Nausea And Vomiting  . Metformin Diarrhea    Level of Care/Admitting Diagnosis ED Disposition    ED Disposition Condition [place] [facility] Area: [facility] [[id number]]  Level of Care: Med-Surg [16]  Diagnosis: CAP (community acquired pneumonia) [175102]  Admitting Physician: Kayleen Memos [[id number]]  Attending Physician: Kayleen Memos [[id number]]  PT Class (Do Not Modify): Observation [104]  PT Acc Code (Do Not Modify): Observation [10022]       B Medical/Surgery History Past Medical History:  Diagnosis Date  . Abdominal pain   . Anemia    NOS  . Anxiety   . Back pain   . Bruises easily   . Chronic diastolic CHF (congestive heart failure) ([place])    a. 06/2013 EF 65-70%.  . Chronic headaches    HISTORY OF   . Depression   . Diabetes mellitus    type II  DIET CONTROLLED  . Diarrhea   . Hepatitis    AGE 30S  . Hyperlipidemia   . Hypertension   . Mild aortic stenosis    a. 06/2013 Echo: EF 65-70%, mod LVH with focal basal hypertrophy, very mild AS.  Marland Kitchen Osteoarthritis   . Oxygen desaturation during sleep    USES 2 LITERS BEDTIME   [facility]AP [facility]   . Shortness of breath    WITH  EXERTION USES 2 L O2 BEDTIME  . Sleep apnea    CPAP WITH O2 2 LITERS 2013 ([facility])  . Wears dentures   . Wears glasses    Past Surgical History:  Procedure Laterality Date  . ABDOMINAL HYSTERECTOMY    . APPENDECTOMY    . CHOLECYSTECTOMY    . JOINT REPLACEMENT     LEFT KNEE   . KNEE ARTHROPLASTY Right 03/22/2015   Procedure: COMPUTER ASSISTED TOTAL KNEE ARTHROPLASTY;  Surgeon: Marybelle Killings, MD;  Location: [place];  Service: Orthopedics;  Laterality: Right;  . KNEE ARTHROSCOPY     RIGHT  . LEFT AND RIGHT HEART CATHETERIZATION WITH CORONARY ANGIOGRAM N/A 10/28/2013   Procedure: LEFT AND RIGHT HEART CATHETERIZATION WITH CORONARY ANGIOGRAM;  Surgeon: Blane Ohara, MD;  Location: [facility] CATH LAB;  Service: Cardiovascular;  Laterality: N/A;  . LEFT HEART CATHETERIZATION WITH CORONARY ANGIOGRAM N/A 11/14/2014   Procedure: LEFT HEART CATHETERIZATION WITH CORONARY ANGIOGRAM;  Surgeon: Josue Hector, MD;  [facility] C[facility];  Service: Cardiovascular;  Laterality: N/A;  . REVISION TOTAL KNEE ARTHROPLASTY  2011  . ROTATOR CUFF REPAIR  2010  . SHOULDER ARTHROSCOPY  09/17/2012   Procedure: ARTHROSCOPY SHOULDER;  Surgeon: Sharmon Revere, MD;  Location: [place];  Service: Orthopedics;  Laterality: Left;  .  TONSILLECTOMY    . TONSILLECTOMY       A IV Location/Drains/Wounds Patient Lines/Drains/Airways Status   Active Line/Drains/Airways    Name:   Placement date:   Placement time:   Site:   Days:   Peripheral IV 01/29/19 Left Antecubital   01/29/19    1647    Antecubital   less than 1   External Urinary Catheter   08/25/18    2014    -   157          Intake/Output Last 24 hours  Intake/Output Summary (Last 24 hours) at 01/29/2019 1804 Last data filed at 01/29/2019 1730 Gross per 24 hour  Intake 100.03 ml  Output -  Net 100.03 ml    Labs/Imaging Results for orders placed or performed during the hospital encounter of 01/29/19 (from the past 48 hour(s))  CBC with Differential     Status:  Abnormal   Collection Time: 01/29/19  3:28 PM  Result Value Ref Range   WBC 11.4 (H) 4.0 - 10.5 K/uL   RBC 4.13 3.87 - 5.11 MIL/uL   Hemoglobin 12.1 12.0 - 15.0 g/dL   HCT 41.7 36.0 - 46.0 %   MCV 101.0 (H) 80.0 - 100.0 fL   MCH 29.3 26.0 - 34.0 pg   MCHC 29.0 (L) 30.0 - 36.0 g/dL   RDW 12.7 11.5 - 15.5 %   Platelets 147 (L) 150 - 400 K/uL   nRBC 0.0 0.0 - 0.2 %   Neutrophils Relative % 69 %   Neutro Abs 7.8 (H) 1.7 - 7.7 K/uL   Lymphocytes Relative 22 %   Lymphs Abs 2.6 0.7 - 4.0 K/uL   Monocytes Relative 6 %   Monocytes Absolute 0.7 0.1 - 1.0 K/uL   Eosinophils Relative 3 %   Eosinophils Absolute 0.3 0.0 - 0.5 K/uL   Basophils Relative 0 %   Basophils Absolute 0.0 0.0 - 0.1 K/uL   Immature Granulocytes 0 %   Abs Immature Granulocytes 0.05 0.00 - 0.07 K/uL    Comment: Performed at [facility], [name] [address] [place], [place] [address]  Brain natriuretic peptide     Status: Abnormal   Collection Time: 01/29/19  3:28 PM  Result Value Ref Range   B Natriuretic Peptide 144.2 (H) 0.0 - 100.0 pg/mL    Comment: Performed at [facility], [place] [address], [place], [place] [address]  I-stat troponin, ED     Status: None   Collection Time: 01/29/19  3:33 PM  Result Value Ref Range   Troponin i, poc 0.02 0.00 - 0.08 ng/mL   Comment 3            Comment: Due to the release kinetics of cTnI, a negative result within the first hours of the onset of symptoms does not rule out myocardial infarction with certainty. If myocardial infarction is still suspected, repeat the test at appropriate intervals.   Basic metabolic panel     Status: Abnormal   Collection Time: 01/29/19  4:29 PM  Result Value Ref Range   Sodium 139 135 - 145 mmol/L   Potassium 4.8 3.5 - 5.1 mmol/L   Chloride 96 (L) 98 - 111 mmol/L   CO2 36 (H) 22 - 32 mmol/L   Glucose, Bld 98 70 - 99 mg/dL   BUN 11 8 - 23 mg/dL   Creatinine, Ser 0.78 0.44 - 1.00 mg/dL   Calcium 8.7 (L) 8.9  - 10.3 mg/dL   GFR  calc non Af Amer >60 >60 mL/min   GFR calc Af Amer >60 >60 mL/min   Anion gap 7 5 - 15    Comment: Performed at [facility], [place] [address], [place], [place] [address]  Urinalysis, Routine w reflex microscopic     Status: Abnormal   Collection Time: 01/29/19  4:33 PM  Result Value Ref Range   Color, Urine YELLOW YELLOW   APPearance CLOUDY (A) CLEAR   Specific Gravity, Urine 1.025 1.005 - 1.030   pH 5.5 5.0 - 8.0   Glucose, UA NEGATIVE NEGATIVE mg/dL   Hgb urine dipstick MODERATE (A) NEGATIVE   Bilirubin Urine NEGATIVE NEGATIVE   Ketones, ur NEGATIVE NEGATIVE mg/dL   Protein, ur TRACE (A) NEGATIVE mg/dL   Nitrite NEGATIVE NEGATIVE   Leukocytes,Ua MODERATE (A) NEGATIVE    Comment: Performed at [facility], [place] [address] [place], [place] [address]  Urinalysis, Microscopic (reflex)     Status: Abnormal   Collection Time: 01/29/19  4:33 PM  Result Value Ref Range   RBC / HPF 11-20 0 - 5 RBC/hpf   WBC, UA 21-50 0 - 5 WBC/hpf   Bacteria, UA FEW (A) NONE SEEN   Squamous Epithelial / LPF 6-10 0 - 5    Comment: Performed at [facility], [place] [address] [place], [place] [address]   Dg Chest 2 View  Result Date: 01/29/2019 CLINICAL DATA:  Shortness of breath and hypoxia EXAM: CHEST - 2 VIEW COMPARISON:  08/25/2018 FINDINGS: Mild cardiomegaly with central pulmonary vascular congestion. No overt edema. No pleural effusion or pneumothorax. Mildly increased opacity at the right costophrenic angle. IMPRESSION: 1. Increased opacity at the right costophrenic angle may indicate developing consolidation or atelectasis. 2. Mild cardiomegaly with central pulmonary vascular congestion. Electronically Signed   By: Ulyses Jarred M.D.   On: 01/29/2019 16:12    Pending Labs Unresulted Labs (From admission, onward)    Start     Ordered   01/29/19 1640  Blood culture (routine x 2)  BLOOD CULTURE X 2,   STAT     01/29/19 1639           Vitals/Pain Today's Vitals   01/29/19 1431 01/29/19 1532 01/29/19 1636 01/29/19 1738  BP: (!) 155/85 (!) 152/67 (!) 166/96 (!) 149/71  Pulse: 72 74 76 73  Resp: 15 20 15 16   Temp: 98.1 F (36.7 C)     TempSrc: Oral     SpO2: (!) 86% 94% 97% 98%  PainSc:        Isolation Precautions No active isolations  Medications Medications  azithromycin (ZITHROMAX) 500 mg in sodium chloride 0.9 % 250 mL IVPB (500 mg Intravenous New Bag/Given 01/29/19 1737)  albuterol (PROVENTIL) (2.5 MG/3ML) 0.083% nebulizer solution 5 mg (5 mg Nebulization Given 01/29/19 1501)  predniSONE (DELTASONE) tablet 60 mg (60 mg Oral Given 01/29/19 1614)  cefTRIAXone (ROCEPHIN) 1 g in sodium chloride 0.9 % 100 mL IVPB (0 g Intravenous Stopped 01/29/19 1730)    Mobility walks with person assist Low fall risk   Focused Assessments   R Recommendations: See Admitting Provider Note  Report given to:

## 2019-01-30 DIAGNOSIS — Z96651 Presence of right artificial knee joint: Secondary | ICD-10-CM | POA: Diagnosis present

## 2019-01-30 DIAGNOSIS — J189 Pneumonia, unspecified organism: Secondary | ICD-10-CM | POA: Diagnosis present

## 2019-01-30 DIAGNOSIS — J181 Lobar pneumonia, unspecified organism: Secondary | ICD-10-CM | POA: Diagnosis not present

## 2019-01-30 DIAGNOSIS — I110 Hypertensive heart disease with heart failure: Secondary | ICD-10-CM | POA: Diagnosis present

## 2019-01-30 DIAGNOSIS — Z8249 Family history of ischemic heart disease and other diseases of the circulatory system: Secondary | ICD-10-CM | POA: Diagnosis not present

## 2019-01-30 DIAGNOSIS — I5032 Chronic diastolic (congestive) heart failure: Secondary | ICD-10-CM | POA: Diagnosis present

## 2019-01-30 DIAGNOSIS — E669 Obesity, unspecified: Secondary | ICD-10-CM | POA: Diagnosis present

## 2019-01-30 DIAGNOSIS — K219 Gastro-esophageal reflux disease without esophagitis: Secondary | ICD-10-CM | POA: Diagnosis present

## 2019-01-30 DIAGNOSIS — J9601 Acute respiratory failure with hypoxia: Secondary | ICD-10-CM | POA: Diagnosis present

## 2019-01-30 DIAGNOSIS — Z825 Family history of asthma and other chronic lower respiratory diseases: Secondary | ICD-10-CM | POA: Diagnosis not present

## 2019-01-30 DIAGNOSIS — F419 Anxiety disorder, unspecified: Secondary | ICD-10-CM | POA: Diagnosis present

## 2019-01-30 DIAGNOSIS — Z6834 Body mass index (BMI) 34.0-34.9, adult: Secondary | ICD-10-CM | POA: Diagnosis not present

## 2019-01-30 LAB — RESPIRATORY PANEL BY PCR
Adenovirus: NOT DETECTED
Bordetella pertussis: NOT DETECTED
CORONAVIRUS 229E-RVPPCR: NOT DETECTED
Chlamydophila pneumoniae: NOT DETECTED
Coronavirus HKU1: NOT DETECTED
Coronavirus NL63: NOT DETECTED
Coronavirus OC43: NOT DETECTED
Influenza A: NOT DETECTED
Influenza B: NOT DETECTED
Metapneumovirus: NOT DETECTED
Mycoplasma pneumoniae: NOT DETECTED
Parainfluenza Virus 1: NOT DETECTED
Parainfluenza Virus 2: NOT DETECTED
Parainfluenza Virus 3: NOT DETECTED
Parainfluenza Virus 4: NOT DETECTED
RESPIRATORY SYNCYTIAL VIRUS-RVPPCR: NOT DETECTED
Rhinovirus / Enterovirus: NOT DETECTED

## 2019-01-30 LAB — CBC WITH DIFFERENTIAL/PLATELET
Abs Immature Granulocytes: 0.06 10*3/uL (ref 0.00–0.07)
Basophils Absolute: 0 10*3/uL (ref 0.0–0.1)
Basophils Relative: 0 %
Eosinophils Absolute: 0 10*3/uL (ref 0.0–0.5)
Eosinophils Relative: 0 %
HCT: 41.7 % (ref 36.0–46.0)
Hemoglobin: 12.2 g/dL (ref 12.0–15.0)
Immature Granulocytes: 1 %
LYMPHS PCT: 13 %
Lymphs Abs: 1.4 10*3/uL (ref 0.7–4.0)
MCH: 28.9 pg (ref 26.0–34.0)
MCHC: 29.3 g/dL — ABNORMAL LOW (ref 30.0–36.0)
MCV: 98.8 fL (ref 80.0–100.0)
Monocytes Absolute: 0.4 10*3/uL (ref 0.1–1.0)
Monocytes Relative: 4 %
Neutro Abs: 9.2 10*3/uL — ABNORMAL HIGH (ref 1.7–7.7)
Neutrophils Relative %: 82 %
Platelets: 141 10*3/uL — ABNORMAL LOW (ref 150–400)
RBC: 4.22 MIL/uL (ref 3.87–5.11)
RDW: 12.7 % (ref 11.5–15.5)
WBC: 11.1 10*3/uL — AB (ref 4.0–10.5)
nRBC: 0 % (ref 0.0–0.2)

## 2019-01-30 LAB — PHOSPHORUS: Phosphorus: 3.1 mg/dL (ref 2.5–4.6)

## 2019-01-30 LAB — BASIC METABOLIC PANEL
Anion gap: 8 (ref 5–15)
BUN: 12 mg/dL (ref 8–23)
CO2: 37 mmol/L — ABNORMAL HIGH (ref 22–32)
Calcium: 9 mg/dL (ref 8.9–10.3)
Chloride: 95 mmol/L — ABNORMAL LOW (ref 98–111)
Creatinine, Ser: 0.64 mg/dL (ref 0.44–1.00)
GFR calc Af Amer: >60 mL/min (ref >60)
GFR calc non Af Amer: >60 mL/min (ref >60)
Glucose, Bld: 108 mg/dL — ABNORMAL HIGH (ref 70–99)
Potassium: 3.9 mmol/L (ref 3.5–5.1)
Sodium: 140 mmol/L (ref 135–145)

## 2019-01-30 LAB — MAGNESIUM: Magnesium: 1.6 mg/dL — ABNORMAL LOW (ref 1.7–2.4)

## 2019-01-30 LAB — PROCALCITONIN: Procalcitonin: <0.1 ng/mL

## 2019-01-30 LAB — LACTIC ACID, PLASMA: LACTIC ACID, VENOUS: 1.3 mmol/L (ref 0.5–1.9)

## 2019-01-30 MED ORDER — POLYETHYLENE GLYCOL 3350 17 G PO PACK
17.0000 g | PACK | Freq: Every day | ORAL | Status: DC
  Administered 2019-01-30 – 2019-02-01 (×3): 17 g via ORAL
  Filled 2019-01-30 (×3): qty 1

## 2019-01-30 MED ORDER — BENZONATATE 100 MG PO CAPS
200.0000 mg | ORAL_CAPSULE | Freq: Three times a day (TID) | ORAL | Status: DC | PRN
  Administered 2019-01-30 – 2019-02-01 (×4): 200 mg via ORAL
  Filled 2019-01-30 (×5): qty 2

## 2019-01-30 MED ORDER — CHLORTHALIDONE 25 MG PO TABS
25.0000 mg | ORAL_TABLET | Freq: Every day | ORAL | Status: DC
  Administered 2019-01-30 – 2019-02-01 (×3): 25 mg via ORAL
  Filled 2019-01-30 (×3): qty 1

## 2019-01-30 MED ORDER — FUROSEMIDE 10 MG/ML IJ SOLN
40.0000 mg | Freq: Once | INTRAMUSCULAR | Status: AC
  Administered 2019-01-30: 40 mg via INTRAVENOUS
  Filled 2019-01-30: qty 4

## 2019-01-30 MED ORDER — MAGNESIUM SULFATE 4 GM/100ML IV SOLN
4.0000 g | Freq: Once | INTRAVENOUS | Status: AC
  Administered 2019-01-30: 4 g via INTRAVENOUS
  Filled 2019-01-30: qty 100

## 2019-01-30 MED ORDER — POLYVINYL ALCOHOL 1.4 % OP SOLN
1.0000 [drp] | OPHTHALMIC | Status: DC | PRN
  Administered 2019-01-30 – 2019-02-01 (×3): 1 [drp] via OPHTHALMIC
  Filled 2019-01-30: qty 15

## 2019-01-30 NOTE — Progress Notes (Signed)
PROGRESS NOTE  [name] PPI:[id number] DOB: 15-Jul-1933 DOA: 01/29/2019 PCP: [name], MD  HPI/Recap of past 24 hours: [name] is a 85 y.o. female with medical history significant for hypertension, chronic anxiety, GERD, obesity who presented to [facility] ED referred by her primary care provider due to hypoxia.  Patient went to see her primary care provider today due to cough and shortness of breath of one-week duration.  Denies any sick contacts, tobacco use, or recent lengthy trips.  Associated with intermittent nausea and vomiting once 2 weeks prior.  Also associated with sudden onset sore throat.  In her PCPs office patient was found to be hypoxic 82% on room air.  Not on oxygen supplementation at baseline.  Due to concern for pneumonia PCP advised to go to the ED for further evaluation.  Patient states she has never been diagnosed with pneumonia in the past.  Denies any chest pain.  TRH asked to admit.  Upon presentation to the ED, vitals signs remarkable for oxygen saturation 82% on room air.  Lab studies remarkable for mildly elevated WBC 11.4 and elevated neutrophil count.  Chest x-ray independently reviewed revealed bilateral lower lobe infiltrates versus atelectasis.  Patient was placed on oxygen supplementation and started on empiric IV antibiotics for community-acquired pneumonia in the ED.  01/30/19: Seen and examined at her bedside.  Reports generalized weakness.  Denies dysuria or abdominal pain.  Breathing is mildly improved at rest.  Will ambulate today and reassess.  We will also obtain home O2 evaluation.   Assessment/Plan: Active Problems:   CAP (community acquired pneumonia)  Community-acquired pneumonia, poa Patient presented with symptoms of nonproductive cough, dyspnea, hypoxia with O2 saturation 82% on room air and a chest x-ray indicative of bibasilar pneumonia Continue IV azithromycin and Rocephin day #1 Obtain sputum culture Has reported intermittent nausea  with vomiting once 2 weeks prior to presentation Influenza A&B PCR negative Respiratory viral panel negative Negative group A strep Blood cultures x2 in process Continue nebs, flutter valve, pulmonary toilet Maintain O2 saturation greater than 92% Obtain home O2 evaluation  Acute hypoxic respiratory failure secondary to community-acquired pneumonia Not on O2 supplementation at baseline Never smoker and no exposure to secondhand smoking Presented with O2 saturation 82% on room air Management as stated above Home O2 evaluation prior to discharge  Generalized weakness/physical debility PT to assess Obtain orthostatic vital signs Obtain TSH level Fall precautions Out of bed to chair as tolerated  Accelerated hypertension Currently on home dose Coreg, chlorthalidone  Given 1 dose of IV Lasix 40 mg once Allergic to amlodipine Consider p.o. hydralazine 10 mg 3 times daily if blood pressure remains elevated  Chronic diastolic CHF Last 2D echo done on 10/28/2013 revealed LVEF 60 to 65% with grade 1 diastolic dysfunction BNP 063 Euvolemic on exam Strict I's and O's and daily weight Continue Coreg, chlorthalidone  Obesity BMI 34 Recommend weight loss outpatient when hemodynamically stable with regular physical activity and healthy dieting Follow-up with your PCP  Risks: High risk for decompensation due to persistent dyspnea with minimal movement, generalized weakness, multiple comorbidities and advanced age.  Patient will require at least 2 midnights for further evaluation and treatment of present condition.    DVT prophylaxis: Subcu Lovenox daily  Code Status: Full code as confirmed by patient and daughter in the room  Family Communication: Daughter in the room.  All questions answered to her satisfaction.  Disposition Plan: Admit as observation status to MedSurg unit  Consults  called: None  Admission status: Observation status    Objective: Vitals:    01/29/19 2008 01/29/19 2010 01/29/19 2037 01/30/19 0532  BP:  (!) 176/93  (!) 165/85  Pulse:  71  65  Resp:  (!) 22  17  Temp:  98.1 F (36.7 C)  97.9 F (36.6 C)  TempSrc:  Oral  Oral  SpO2:  94% 94% 95%  Weight:    100.3 kg  Height: 5\' 6"  (1.676 m)       Intake/Output Summary (Last 24 hours) at 01/30/2019 0755 Last data filed at 01/30/2019 0400 Gross per 24 hour  Intake 350.44 ml  Output 400 ml  Net -49.56 ml   Filed Weights   01/29/19 2007 01/30/19 0532  Weight: 100.9 kg 100.3 kg    Exam:  . General: 85 y.o. year-old female well developed well nourished in no acute distress.  Somnolent but easily arousable to voices. . Cardiovascular: Regular rate and rhythm with no rubs or gallops.  No thyromegaly or JVD noted.   Marland Kitchen Respiratory: Mild rales at bases with no wheezes.  Poor inspiratory effort. . Abdomen: Soft nontender nondistended with normal bowel sounds x4 quadrants. . Musculoskeletal: Trace lower extremity edema. 2/4 pulses in all 4 extremities. Marland Kitchen Psychiatry: Mood is appropriate for condition and setting   Data Reviewed: CBC: Recent Labs  Lab 01/29/19 1528 01/30/19 0527  WBC 11.4* 11.1*  NEUTROABS 7.8* 9.2*  HGB 12.1 12.2  HCT 41.7 41.7  MCV 101.0* 98.8  PLT 147* 518*   Basic Metabolic Panel: Recent Labs  Lab 01/29/19 1629 01/30/19 0527  NA 139 140  K 4.8 3.9  CL 96* 95*  CO2 36* 37*  GLUCOSE 98 108*  BUN 11 12  CREATININE 0.78 0.64  CALCIUM 8.7* 9.0  MG  --  1.6*  PHOS  --  3.1   GFR: Estimated Creatinine Clearance: 63.7 mL/min (by C-G formula based on SCr of 0.64 mg/dL). Liver Function Tests: No results for input(s): AST, ALT, ALKPHOS, BILITOT, PROT, ALBUMIN in the last 168 hours. No results for input(s): LIPASE, AMYLASE in the last 168 hours. No results for input(s): AMMONIA in the last 168 hours. Coagulation Profile: No results for input(s): INR, PROTIME in the last 168 hours. Cardiac Enzymes: No results for input(s): CKTOTAL, CKMB,  CKMBINDEX, TROPONINI in the last 168 hours. BNP (last 3 results) No results for input(s): PROBNP in the last 8760 hours. HbA1C: No results for input(s): HGBA1C in the last 72 hours. CBG: No results for input(s): GLUCAP in the last 168 hours. Lipid Profile: No results for input(s): CHOL, HDL, LDLCALC, TRIG, CHOLHDL, LDLDIRECT in the last 72 hours. Thyroid Function Tests: No results for input(s): TSH, T4TOTAL, FREET4, T3FREE, THYROIDAB in the last 72 hours. Anemia Panel: No results for input(s): VITAMINB12, FOLATE, FERRITIN, TIBC, IRON, RETICCTPCT in the last 72 hours. Urine analysis:    Component Value Date/Time   COLORURINE YELLOW 01/29/2019 1633   APPEARANCEUR CLOUDY (A) 01/29/2019 1633   LABSPEC 1.025 01/29/2019 1633   PHURINE 5.5 01/29/2019 1633   GLUCOSEU NEGATIVE 01/29/2019 1633   GLUCOSEU NEGATIVE 03/30/2018 1358   HGBUR MODERATE (A) 01/29/2019 1633   BILIRUBINUR NEGATIVE 01/29/2019 1633   BILIRUBINUR neg 05/15/2018 1145   KETONESUR NEGATIVE 01/29/2019 1633   PROTEINUR TRACE (A) 01/29/2019 1633   UROBILINOGEN 0.2 05/15/2018 1145   UROBILINOGEN 0.2 03/30/2018 1358   NITRITE NEGATIVE 01/29/2019 1633   LEUKOCYTESUR MODERATE (A) 01/29/2019 1633   Sepsis Labs: @LABRCNTIP (procalcitonin:4,lacticidven:4)  ) Recent Results (  from the past 240 hour(s))  Blood culture (routine x 2)     Status: None (Preliminary result)   Collection Time: 01/29/19  4:40 PM  Result Value Ref Range Status   Specimen Description   Final    BLOOD LEFT ANTECUBITAL Performed at [facility] [address], [place], [place] [address]    Special Requests   Final    Blood Culture adequate volume BOTTLES DRAWN AEROBIC AND ANAEROBIC Performed at [facility], [place] [address], [place], [place] [address]    Culture PENDING  Incomplete   Report Status PENDING  Incomplete  Respiratory Panel by PCR     Status: None   Collection Time: 01/29/19  6:41 PM  Result Value Ref Range  Status   Adenovirus NOT DETECTED NOT DETECTED Final   Coronavirus 229E NOT DETECTED NOT DETECTED Final    Comment: (NOTE) The Coronavirus on the Respiratory Panel, DOES NOT test for the novel  Coronavirus (2019 nCoV)    Coronavirus HKU1 NOT DETECTED NOT DETECTED Final   Coronavirus NL63 NOT DETECTED NOT DETECTED Final   Coronavirus OC43 NOT DETECTED NOT DETECTED Final   Metapneumovirus NOT DETECTED NOT DETECTED Final   Rhinovirus / Enterovirus NOT DETECTED NOT DETECTED Final   Influenza A NOT DETECTED NOT DETECTED Final   Influenza B NOT DETECTED NOT DETECTED Final   Parainfluenza Virus 1 NOT DETECTED NOT DETECTED Final   Parainfluenza Virus 2 NOT DETECTED NOT DETECTED Final   Parainfluenza Virus 3 NOT DETECTED NOT DETECTED Final   Parainfluenza Virus 4 NOT DETECTED NOT DETECTED Final   Respiratory Syncytial Virus NOT DETECTED NOT DETECTED Final   Bordetella pertussis NOT DETECTED NOT DETECTED Final   Chlamydophila pneumoniae NOT DETECTED NOT DETECTED Final   Mycoplasma pneumoniae NOT DETECTED NOT DETECTED Final    Comment: Performed at [facility], [address] [place], [place] [address]  Group A Strep by PCR     Status: None   Collection Time: 01/29/19  6:41 PM  Result Value Ref Range Status   Group A Strep by PCR NOT DETECTED NOT DETECTED Final    Comment: Performed at [facility], [place] [address], [place], [place] [address]      Studies: Dg Chest 2 View  Result Date: 01/29/2019 CLINICAL DATA:  Shortness of breath and hypoxia EXAM: CHEST - 2 VIEW COMPARISON:  08/25/2018 FINDINGS: Mild cardiomegaly with central pulmonary vascular congestion. No overt edema. No pleural effusion or pneumothorax. Mildly increased opacity at the right costophrenic angle. IMPRESSION: 1. Increased opacity at the right costophrenic angle may indicate developing consolidation or atelectasis. 2. Mild cardiomegaly with central pulmonary vascular congestion. Electronically Signed    By: Ulyses Jarred M.D.   On: 01/29/2019 16:12    Scheduled Meds: . carvedilol  6.25 mg Oral BID WC  . enoxaparin (LOVENOX) injection  40 mg Subcutaneous Q24H  . furosemide  40 mg Intravenous Daily  . gabapentin  100 mg Oral TID  . guaiFENesin  1,200 mg Oral BID  . ipratropium-albuterol  3 mL Nebulization TID  . mouth rinse  15 mL Mouth Rinse BID  . mirabegron ER  25 mg Oral Daily  . pantoprazole  40 mg Oral Daily  . polyethylene glycol  17 g Oral Daily  . sodium chloride HYPERTONIC  4 mL Nebulization BID  . traZODone  50 mg Oral QHS    Continuous Infusions: . sodium chloride    . azithromycin (ZITHROMAX) 500 MG  IVPB (Vial-Mate [employer])    . cefTRIAXone (ROCEPHIN)  IV    . magnesium sulfate 1 - 4 g bolus IVPB       LOS: 0 days     Kayleen Memos, MD [facility] Pager [phone]  If 7PM-7AM, please contact night-coverage [email or web address] Password [facility] 01/30/2019, 7:55 AM

## 2019-01-30 NOTE — Evaluation (Signed)
Clinical/Bedside Swallow Evaluation Patient Details  Name: [name] MRN: [id number] Date of Birth: May 16, 1933  Today's Date: 01/30/2019 Time: SLP Start Time (ACUTE ONLY): 1451 SLP Stop Time (ACUTE ONLY): 1502 SLP Time Calculation (min) (ACUTE ONLY): 11 min  Past Medical History:  Past Medical History:  Diagnosis Date  . Abdominal pain   . Anemia    NOS  . Anxiety   . Back pain   . Bruises easily   . Chronic diastolic CHF (congestive heart failure) ([place])    a. 06/2013 EF 65-70%.  . Chronic headaches    HISTORY OF   . Depression   . Diabetes mellitus    type II  DIET CONTROLLED  . Diarrhea   . Hepatitis    AGE 30S  . Hyperlipidemia   . Hypertension   . Mild aortic stenosis    a. 06/2013 Echo: EF 65-70%, mod LVH with focal basal hypertrophy, very mild AS.  Marland Kitchen Osteoarthritis   . Oxygen desaturation during sleep    USES 2 LITERS BEDTIME   VIA CPAP 06/2012 [facility]   . Shortness of breath    WITH EXERTION USES 2 L O2 BEDTIME  . Sleep apnea    CPAP WITH O2 2 LITERS 2013 ([facility])  . Wears dentures   . Wears glasses    Past Surgical History:  Past Surgical History:  Procedure Laterality Date  . ABDOMINAL HYSTERECTOMY    . APPENDECTOMY    . CHOLECYSTECTOMY    . JOINT REPLACEMENT     LEFT KNEE   . KNEE ARTHROPLASTY Right 03/22/2015   Procedure: COMPUTER ASSISTED TOTAL KNEE ARTHROPLASTY;  Surgeon: Marybelle Killings, MD;  Location: [facility];  Service: Orthopedics;  Laterality: Right;  . KNEE ARTHROSCOPY     RIGHT  . LEFT AND RIGHT HEART CATHETERIZATION WITH CORONARY ANGIOGRAM N/A 10/28/2013   Procedure: LEFT AND RIGHT HEART CATHETERIZATION WITH CORONARY ANGIOGRAM;  Surgeon: Blane Ohara, MD;  Location: [facility] CATH LAB;  Service: Cardiovascular;  Laterality: N/A;  . LEFT HEART CATHETERIZATION WITH CORONARY ANGIOGRAM N/A 11/14/2014   Procedure: LEFT HEART CATHETERIZATION WITH CORONARY ANGIOGRAM;  Surgeon: Josue Hector, MD;  Location: [facility] CATH LAB;  Service: Cardiovascular;   Laterality: N/A;  . REVISION TOTAL KNEE ARTHROPLASTY  2011  . ROTATOR CUFF REPAIR  2010  . SHOULDER ARTHROSCOPY  09/17/2012   Procedure: ARTHROSCOPY SHOULDER;  Surgeon: Sharmon Revere, MD;  Location: [facility];  Service: Orthopedics;  Laterality: Left;  . TONSILLECTOMY    . TONSILLECTOMY     HPI:  [name] is a 85 y.o. female with medical history significant for hypertension, chronic anxiety, GERD, DM, shortness of breath, obesity and old right CVA seen on MRI 2019 who presented due to hypoxia. Chest x-ray independently reviewed revealed bilateral lower lobe infiltrates versus atelectasis.   Assessment / Plan / Recommendation Clinical Impression  Suspected esophageal component from observation and pt report. Delayed cough x 1 and effortful swallow, hesitation with pt reporting "that is what happens, feels like it wants to come back up sometimes." She appears to be conscientious when eating and takes small bites. Pt reports coughing with liquids "once in awhile" that has not worsened following stroke in 2019; nor has pharyngeal globus sensation. Educated pt on reflux strategies (remain upright after meals, limit peppermint candy, drink liquids during meals). Post pradial aspiration possible however this is her episode having pneumonia- likely has not been chronic if occuring. If she continues to have admissions for  pneumonia, MBS could be performed. Recommend continue regular, thin and GER strategies. No further ST needed.   SLP Visit Diagnosis: Dysphagia, unspecified (R13.10)    Aspiration Risk  Mild aspiration risk    Diet Recommendation Regular;Thin liquid   Liquid Administration via: Cup;Straw Medication Administration: Whole meds with liquid Supervision: Patient able to self feed Compensations: Slow rate;Small sips/bites(liquids during meals) Postural Changes: Seated upright at 90 degrees;Remain upright for at least 30 minutes after po intake    Other  Recommendations Oral Care  Recommendations: Oral care BID   Follow up Recommendations None      Frequency and Duration            Prognosis        Swallow Study   General HPI: [name] is a 85 y.o. female with medical history significant for hypertension, chronic anxiety, GERD, DM, shortness of breath, obesity and old right CVA seen on MRI 2019 who presented due to hypoxia. Chest x-ray independently reviewed revealed bilateral lower lobe infiltrates versus atelectasis. Type of Study: Bedside Swallow Evaluation Previous Swallow Assessment: (none) Diet Prior to this Study: Regular;Thin liquids Temperature Spikes Noted: No Respiratory Status: Nasal cannula History of Recent Intubation: No Behavior/Cognition: Alert;Cooperative;Pleasant mood;Other (Comment)(HOH) Oral Cavity Assessment: Within Functional Limits Oral Care Completed by SLP: Yes Oral Cavity - Dentition: Dentures, top;Dentures, bottom Vision: Functional for self-feeding Self-Feeding Abilities: Able to feed self Patient Positioning: Upright in bed Baseline Vocal Quality: Normal Volitional Cough: Strong Volitional Swallow: Able to elicit    Oral/Motor/Sensory Function Overall Oral Motor/Sensory Function: Within functional limits   Ice Chips Ice chips: Not tested   Thin Liquid Thin Liquid: Impaired Presentation: Cup;Straw Pharyngeal  Phase Impairments: Cough - Delayed    Nectar Thick Nectar Thick Liquid: Not tested   Honey Thick Honey Thick Liquid: Not tested   Puree Puree: Not tested   Solid     Solid: Within functional limits      Houston Siren 01/30/2019,3:21 PM  Orbie Pyo Colvin Caroli.Ed Risk analyst [phone] Office [phone]

## 2019-01-30 NOTE — Evaluation (Signed)
Physical Therapy 1x  Evaluation Patient Details Name: [name] MRN: [id number] DOB: [birth year] Today's Date: 01/30/2019   History of Present Illness  pt admit with SOB directly from her primary MD due to hypoxia at their office of 83% on RA. Now admit with pneumonia.   Clinical Impression  Pt with pneumonia and does not normally have O2 at home. Currently when I entered on NC2L at 99-100%, I removed it while she was eating and during our session. While sitting , it remained at 93-95 %RA, then with ambulation it did drop to 88% but recovered with pursed lip breathing exercises to 92% on RA after about 1 minute. Placed back on her [place] 2L however told nurse to continue to wean and retest.  Pt's ambulation with RW seemed almost at baseline per pt, a "little" weak" however this was first time up in a while. Pt will have dtr's support when she return home with dtr. No further Acute PT needs at this time. Nursing can continue to assess ambulation O2 sats.   SATURATION QUALIFICATIONS: (This note is used to comply with regulatory documentation for home oxygen)  Patient Saturations on Room Air at Rest = 95%  Patient Saturations on Room Air while Ambulating = 88%  Patient Saturations on 2 Liters of oxygen while Ambulating = 93%      Follow Up Recommendations No PT follow up    Equipment Recommendations  None recommended by PT    Recommendations for Other Services       Precautions / Restrictions Precautions Precautions: None Restrictions Weight Bearing Restrictions: No      Mobility  Bed Mobility Overal bed mobility: (not tested pt was up in recliner )                Transfers Overall transfer level: Needs assistance Equipment used: Rolling walker (2 wheeled) Transfers: Sit to/from Stand Sit to Stand: Min guard            Ambulation/Gait Ambulation/Gait assistance: Supervision Gait Distance (Feet): 40 Feet Assistive device: Rolling walker (2 wheeled) Gait  Pattern/deviations: Step-through pattern     General Gait Details: pt stated I feel pretty good , just a little weak because I haven't moved very much last few days  Stairs            Wheelchair Mobility    Modified Rankin (Stroke Patients Only)       Balance Overall balance assessment: Needs assistance Sitting-balance support: Bilateral upper extremity supported Sitting balance-Leahy Scale: Normal     Standing balance support: Bilateral upper extremity supported;During functional activity Standing balance-Leahy Scale: Fair                               Pertinent Vitals/Pain Pain Assessment: No/denies pain    Home Living Family/patient expects to be discharged to:: Private residence(may DC to her dtr house ) Living Arrangements: Other relatives Available Help at Discharge: Family;Available 24 hours/day Type of Home: House Home Access: Level entry(or may have 2 steps with a rail and she does not have difficulty with this per pt)       Home Equipment: Cane - single point;Walker - 4 wheels Additional Comments: pt plans to dc to daughters home      Prior Function Level of Independence: Independent with assistive device(s)         Comments: Patient independent with ADLs, IADLs (med mgmt and finances), mobility using cane, and  driving      Hand Dominance   Dominant Hand: Right    Extremity/Trunk Assessment        Lower Extremity Assessment Lower Extremity Assessment: Overall WFL for tasks assessed       Communication   Communication: HOH  Cognition Arousal/Alertness: Awake/alert Behavior During Therapy: WFL for tasks assessed/performed Overall Cognitive Status: Within Functional Limits for tasks assessed                                        General Comments      Exercises     Assessment/Plan    PT Assessment Patent does not need any further PT services  PT Problem List         PT Treatment Interventions       PT Goals (Current goals can be found in the Care Plan section)  Acute Rehab PT Goals PT Goal Formulation: All assessment and education complete, DC therapy    Frequency     Barriers to discharge        Co-evaluation               AM-PAC PT "6 Clicks" Mobility  Outcome Measure Help needed turning from your back to your side while in a flat bed without using bedrails?: None Help needed moving from lying on your back to sitting on the side of a flat bed without using bedrails?: None Help needed moving to and from a bed to a chair (including a wheelchair)?: A Little Help needed standing up from a chair using your arms (e.g., wheelchair or bedside chair)?: A Little Help needed to walk in hospital room?: A Little Help needed climbing 3-5 steps with a railing? : A Little 6 Click Score: 20    End of Session   Activity Tolerance: Patient tolerated treatment well Patient left: in chair;with call bell/phone within reach Nurse Communication: Mobility status PT Visit Diagnosis: Muscle weakness (generalized) (M62.81)    Time: 7741-4239 PT Time Calculation (min) (ACUTE ONLY): 42 min   Charges:   PT Evaluation $PT Eval Low Complexity: 1 Low PT Treatments $Gait Training: 8-22 mins $Therapeutic Activity: 8-22 mins       Clide Dales, PT Acute Rehabilitation Services Pager: [phone] Office: [phone] 01/30/2019   Clide Dales 01/30/2019, 6:12 PM

## 2019-01-31 MED ORDER — FUROSEMIDE 10 MG/ML IJ SOLN
40.0000 mg | Freq: Once | INTRAMUSCULAR | Status: AC
  Administered 2019-01-31: 40 mg via INTRAVENOUS
  Filled 2019-01-31: qty 4

## 2019-01-31 MED ORDER — DEXTROMETHORPHAN POLISTIREX ER 30 MG/5ML PO SUER
30.0000 mg | Freq: Every evening | ORAL | Status: DC | PRN
  Administered 2019-01-31: 30 mg via ORAL
  Filled 2019-01-31 (×2): qty 5

## 2019-01-31 MED ORDER — GUAIFENESIN ER 600 MG PO TB12: 600.0000 mg | ORAL_TABLET | Freq: Two times a day (BID) | ORAL | Status: DC | PRN

## 2019-01-31 MED ORDER — METHYLPREDNISOLONE SODIUM SUCC 125 MG IJ SOLR
60.0000 mg | Freq: Once | INTRAMUSCULAR | Status: AC
  Administered 2019-01-31: 60 mg via INTRAVENOUS
  Filled 2019-01-31: qty 2

## 2019-01-31 MED ORDER — CLONAZEPAM 1 MG PO TABS
1.0000 mg | ORAL_TABLET | Freq: Two times a day (BID) | ORAL | Status: DC | PRN
  Administered 2019-01-31 – 2019-02-01 (×2): 1 mg via ORAL
  Filled 2019-01-31 (×2): qty 1

## 2019-01-31 NOTE — Progress Notes (Signed)
PROGRESS NOTE  [name] EHU:[id number] DOB: [birth year] DOA: 01/29/2019 PCP: [name], MD  HPI/Recap of past 24 hours: [name] is a 85 y.o. female with medical history significant for hypertension, chronic anxiety, GERD, obesity who presented to [facility] ED referred by her primary care provider due to hypoxia.  Patient went to see her primary care provider today due to cough and shortness of breath of one-week duration.  Denies any sick contacts, tobacco use, or recent lengthy trips.  Associated with intermittent nausea and vomiting once 2 weeks prior.  Also associated with sudden onset sore throat.  In her PCPs office patient was found to be hypoxic 82% on room air.  Not on oxygen supplementation at baseline.  Due to concern for pneumonia PCP advised to go to the ED for further evaluation.  Patient states she has never been diagnosed with pneumonia in the past.  Denies any chest pain.  TRH asked to admit.  Upon presentation to the ED, vitals signs remarkable for oxygen saturation 82% on room air.  Lab studies remarkable for mildly elevated WBC 11.4 and elevated neutrophil count.  Chest x-ray independently reviewed revealed bilateral lower lobe infiltrates versus atelectasis.  Patient was placed on oxygen supplementation and started on empiric IV antibiotics for community-acquired pneumonia in the ED.  01/30/19: Generalized weakness.  Denies dysuria or abdominal pain.  Breathing is mildly improved at rest.  Will ambulate today and reassess.  We will also obtain home O2 evaluation.  01/31/19: Patient seen and examined at her bedside.  No acute events overnight.  Still hypoxic per last evaluation done by physical therapy on 01/30/2019; oxygen saturation 88% on room air while ambulating.  Given 1 dose of Lasix 40 mg IV once, 60 mg Solu-Medrol IV once.  Continue early mobility.    Repeat home O2 evaluation tomorrow 02/01/19 for DC planning.   Assessment/Plan: Active Problems:   CAP (community  acquired pneumonia)  Community-acquired pneumonia, poa Patient presented with symptoms of nonproductive cough, dyspnea, hypoxia with O2 saturation 82% on room air at rest and a chest x-ray indicative of bibasilar pneumonia Continue IV azithromycin and Rocephin day #2 Blood cultures x2 no growth x2 days Influenza A&B PCR negative Respiratory viral panel negative Negative group A strep Continue nebs, flutter valve, pulmonary toilet Maintain O2 saturation greater than 92%  Acute hypoxic respiratory failure secondary to community-acquired pneumonia Not on O2 supplementation at baseline Home O2 evaluation done on 01/30/2019 revealed O2 saturation 88% on room air with ambulation Never smoker and no exposure to secondhand smoking Presented with O2 saturation 82% on room air at rest Management as stated above Repeat home O2 evaluation tomorrow 02/01/19 for DC planning Maintain O2 saturation greater than 92% Mobilize as tolerated  Generalized weakness/physical debility PT assessed and recommended no further follow-up Normal TSH Fall precautions Early mobility  Accelerated hypertension Currently on home dose Coreg, chlorthalidone  Given 1 dose of IV Lasix 40 mg once Repeat dose of IV Lasix 40 mg once on 01/31/2019 Allergic to amlodipine P.o. hydralazine 10 mg 3 times daily if blood pressure remains elevated post Lasix Continue to monitor vital signs  Chronic diastolic CHF Last 2D echo done on 10/28/2013 revealed LVEF 60 to 65% with grade 1 diastolic dysfunction BNP 785 Euvolemic on exam Strict I's and O's and daily weight Continue Coreg, chlorthalidone  Obesity BMI 34 Recommend weight loss outpatient when hemodynamically stable with regular physical activity and healthy dieting Follow-up with your PCP  Chronic anxiety  On Klonopin at home Currently on Klonopin 0.25 mg twice daily as needed Increased dose to home dose 1 mg twice daily as needed at patient's request Closely monitor  on higher dose    DVT prophylaxis: Subcu Lovenox daily  Code Status: Full code as confirmed by patient and daughter in the room  Family Communication:  None at bedside  Disposition Plan:  Discharge to home tomorrow 02/01/2019  Consults called: None     Objective: Vitals:   01/30/19 2044 01/30/19 2049 01/31/19 0508 01/31/19 0817  BP:  139/63 (!) 151/66   Pulse:  66 63   Resp:   18   Temp:   98.5 F (36.9 C)   TempSrc:   Oral   SpO2: 95%  97% 94%  Weight:   100.8 kg   Height:        Intake/Output Summary (Last 24 hours) at 01/31/2019 1010 Last data filed at 01/30/2019 1900 Gross per 24 hour  Intake 1030 ml  Output -  Net 1030 ml   Filed Weights   01/29/19 2007 01/30/19 0532 01/31/19 0508  Weight: 100.9 kg 100.3 kg 100.8 kg    Exam:  . General: 85 y.o. year-old female well-developed well-nourished no acute distress.  Alert and oriented x3. . Cardiovascular: Regular rate and rhythm with no rubs or gallops.  No JVD or thyromegaly. Marland Kitchen Respiratory: Clear to auscultation with no wheezes or rales.  Poor inspiratory effort. . Abdomen: Soft nontender nondistended with normal bowel sounds x4 quadrants. . Musculoskeletal: Trace lower extremity edema. 2/4 pulses in all 4 extremities. Marland Kitchen Psychiatry: Mood is appropriate for condition and setting   Data Reviewed: CBC: Recent Labs  Lab 01/29/19 1528 01/30/19 0527  WBC 11.4* 11.1*  NEUTROABS 7.8* 9.2*  HGB 12.1 12.2  HCT 41.7 41.7  MCV 101.0* 98.8  PLT 147* 497*   Basic Metabolic Panel: Recent Labs  Lab 01/29/19 1629 01/30/19 0527  NA 139 140  K 4.8 3.9  CL 96* 95*  CO2 36* 37*  GLUCOSE 98 108*  BUN 11 12  CREATININE 0.78 0.64  CALCIUM 8.7* 9.0  MG  --  1.6*  PHOS  --  3.1   GFR: Estimated Creatinine Clearance: 63.8 mL/min (by C-G formula based on SCr of 0.64 mg/dL). Liver Function Tests: No results for input(s): AST, ALT, ALKPHOS, BILITOT, PROT, ALBUMIN in the last 168 hours. No results for input(s):  LIPASE, AMYLASE in the last 168 hours. No results for input(s): AMMONIA in the last 168 hours. Coagulation Profile: No results for input(s): INR, PROTIME in the last 168 hours. Cardiac Enzymes: No results for input(s): CKTOTAL, CKMB, CKMBINDEX, TROPONINI in the last 168 hours. BNP (last 3 results) No results for input(s): PROBNP in the last 8760 hours. HbA1C: No results for input(s): HGBA1C in the last 72 hours. CBG: No results for input(s): GLUCAP in the last 168 hours. Lipid Profile: No results for input(s): CHOL, HDL, LDLCALC, TRIG, CHOLHDL, LDLDIRECT in the last 72 hours. Thyroid Function Tests: No results for input(s): TSH, T4TOTAL, FREET4, T3FREE, THYROIDAB in the last 72 hours. Anemia Panel: No results for input(s): VITAMINB12, FOLATE, FERRITIN, TIBC, IRON, RETICCTPCT in the last 72 hours. Urine analysis:    Component Value Date/Time   COLORURINE YELLOW 01/29/2019 1633   APPEARANCEUR CLOUDY (A) 01/29/2019 1633   LABSPEC 1.025 01/29/2019 1633   PHURINE 5.5 01/29/2019 1633   GLUCOSEU NEGATIVE 01/29/2019 1633   GLUCOSEU NEGATIVE 03/30/2018 1358   HGBUR MODERATE (A) 01/29/2019 1633   BILIRUBINUR  NEGATIVE 01/29/2019 1633   BILIRUBINUR neg 05/15/2018 1145   [place] 01/29/2019 1633   PROTEINUR TRACE (A) 01/29/2019 1633   UROBILINOGEN 0.2 05/15/2018 1145   UROBILINOGEN 0.2 03/30/2018 1358   NITRITE NEGATIVE 01/29/2019 1633   LEUKOCYTESUR MODERATE (A) 01/29/2019 1633   Sepsis Labs: @LABRCNTIP (procalcitonin:4,lacticidven:4)  ) Recent Results (from the past 240 hour(s))  Blood culture (routine x 2)     Status: None (Preliminary result)   Collection Time: 01/29/19  4:40 PM  Result Value Ref Range Status   Specimen Description   Final    BLOOD LEFT ANTECUBITAL Performed at [facility], [place] [address], [place], [place] [address]    Special Requests   Final    Blood Culture adequate volume BOTTLES DRAWN AEROBIC AND ANAEROBIC   Culture   Final      NO GROWTH 2 DAYS Performed at [facility], [place] [address] [place], [place] [address]    Report Status PENDING  Incomplete  Respiratory Panel by PCR     Status: None   Collection Time: 01/29/19  6:41 PM  Result Value Ref Range Status   Adenovirus NOT DETECTED NOT DETECTED Final   Coronavirus 229E NOT DETECTED NOT DETECTED Final    Comment: (NOTE) The Coronavirus on the Respiratory Panel, DOES NOT test for the novel  Coronavirus (2019 nCoV)    Coronavirus HKU1 NOT DETECTED NOT DETECTED Final   Coronavirus NL63 NOT DETECTED NOT DETECTED Final   Coronavirus OC43 NOT DETECTED NOT DETECTED Final   Metapneumovirus NOT DETECTED NOT DETECTED Final   Rhinovirus / Enterovirus NOT DETECTED NOT DETECTED Final   Influenza A NOT DETECTED NOT DETECTED Final   Influenza B NOT DETECTED NOT DETECTED Final   Parainfluenza Virus 1 NOT DETECTED NOT DETECTED Final   Parainfluenza Virus 2 NOT DETECTED NOT DETECTED Final   Parainfluenza Virus 3 NOT DETECTED NOT DETECTED Final   Parainfluenza Virus 4 NOT DETECTED NOT DETECTED Final   Respiratory Syncytial Virus NOT DETECTED NOT DETECTED Final   Bordetella pertussis NOT DETECTED NOT DETECTED Final   Chlamydophila pneumoniae NOT DETECTED NOT DETECTED Final   Mycoplasma pneumoniae NOT DETECTED NOT DETECTED Final    Comment: Performed at [facility], [address], [place], [place] [address]  Group A Strep by PCR     Status: None   Collection Time: 01/29/19  6:41 PM  Result Value Ref Range Status   Group A Strep by PCR NOT DETECTED NOT DETECTED Final    Comment: Performed at [facility], [place] [address] [place], [place] [address]  Blood culture (routine x 2)     Status: None (Preliminary result)   Collection Time: 01/29/19  8:16 PM  Result Value Ref Range Status   Specimen Description   Final    BLOOD RIGHT ANTECUBITAL Performed at [place] [address], [place], [place] [address]     Special Requests   Final    BOTTLES DRAWN AEROBIC ONLY Blood Culture adequate volume Performed at [place] [address], [place], [place] [address]    Culture   Final    NO GROWTH 2 DAYS Performed at [place] [address], [place], [place] [address]    Report Status PENDING  Incomplete      Studies: No results found.  Scheduled Meds: . carvedilol  6.25 mg Oral BID WC  . chlorthalidone  25 mg Oral Daily  . enoxaparin (  LOVENOX) injection  40 mg Subcutaneous Q24H  . gabapentin  100 mg Oral TID  . guaiFENesin  1,200 mg Oral BID  . ipratropium-albuterol  3 mL Nebulization TID  . mouth rinse  15 mL Mouth Rinse BID  . mirabegron ER  25 mg Oral Daily  . pantoprazole  40 mg Oral Daily  . polyethylene glycol  17 g Oral Daily  . sodium chloride HYPERTONIC  4 mL Nebulization BID  . traZODone  50 mg Oral QHS    Continuous Infusions: . sodium chloride    . azithromycin (ZITHROMAX) 500 MG IVPB (Vial-Mate [employer]) Stopped (01/30/19 1834)  . cefTRIAXone (ROCEPHIN)  IV Stopped (01/30/19 1722)     LOS: 1 day     Kayleen Memos, MD [facility] Pager [phone]  If 7PM-7AM, please contact night-coverage [email or web address] Password [email or web address] 01/31/2019, 10:10 AM

## 2019-02-01 LAB — CBC WITH DIFFERENTIAL/PLATELET
Abs Immature Granulocytes: 0.07 10*3/uL (ref 0.00–0.07)
Basophils Absolute: 0 10*3/uL (ref 0.0–0.1)
Basophils Relative: 0 %
EOS ABS: 0.1 10*3/uL (ref 0.0–0.5)
EOS PCT: 0 %
HCT: 42.3 % (ref 36.0–46.0)
Hemoglobin: 12.6 g/dL (ref 12.0–15.0)
Immature Granulocytes: 1 %
Lymphocytes Relative: 22 %
Lymphs Abs: 2.7 10*3/uL (ref 0.7–4.0)
MCH: 29.1 pg (ref 26.0–34.0)
MCHC: 29.8 g/dL — ABNORMAL LOW (ref 30.0–36.0)
MCV: 97.7 fL (ref 80.0–100.0)
Monocytes Absolute: 0.8 10*3/uL (ref 0.1–1.0)
Monocytes Relative: 7 %
Neutro Abs: 8.6 10*3/uL — ABNORMAL HIGH (ref 1.7–7.7)
Neutrophils Relative %: 70 %
Platelets: 157 10*3/uL (ref 150–400)
RBC: 4.33 MIL/uL (ref 3.87–5.11)
RDW: 12.8 % (ref 11.5–15.5)
WBC: 12.3 10*3/uL — ABNORMAL HIGH (ref 4.0–10.5)
nRBC: 0 % (ref 0.0–0.2)

## 2019-02-01 LAB — BASIC METABOLIC PANEL
Anion gap: 8 (ref 5–15)
BUN: 24 mg/dL — AB (ref 8–23)
CO2: 35 mmol/L — ABNORMAL HIGH (ref 22–32)
CREATININE: 0.72 mg/dL (ref 0.44–1.00)
Calcium: 9.5 mg/dL (ref 8.9–10.3)
Chloride: 96 mmol/L — ABNORMAL LOW (ref 98–111)
GFR calc Af Amer: >60 mL/min (ref >60)
GFR calc non Af Amer: >60 mL/min (ref >60)
Glucose, Bld: 106 mg/dL — ABNORMAL HIGH (ref 70–99)
Potassium: 3.5 mmol/L (ref 3.5–5.1)
Sodium: 139 mmol/L (ref 135–145)

## 2019-02-01 LAB — MAGNESIUM: Magnesium: 1.8 mg/dL (ref 1.7–2.4)

## 2019-02-01 MED ORDER — CEFDINIR 300 MG PO CAPS
300.0000 mg | ORAL_CAPSULE | Freq: Two times a day (BID) | ORAL | Status: DC
  Administered 2019-02-01: 300 mg via ORAL
  Filled 2019-02-01: qty 1

## 2019-02-01 MED ORDER — IPRATROPIUM-ALBUTEROL 0.5-2.5 (3) MG/3ML IN SOLN: 3.0000 mL | Freq: Two times a day (BID) | RESPIRATORY_TRACT | Status: DC

## 2019-02-01 MED ORDER — BENZONATATE 200 MG PO CAPS: 200.0000 mg | ORAL_CAPSULE | Freq: Two times a day (BID) | ORAL | 0 refills | Status: DC | PRN

## 2019-02-01 MED ORDER — TRAZODONE HCL 50 MG PO TABS: 50.0000 mg | ORAL_TABLET | Freq: Every day | ORAL | 0 refills | Status: DC

## 2019-02-01 MED ORDER — DEXTROMETHORPHAN POLISTIREX ER 30 MG/5ML PO SUER: 30.0000 mg | Freq: Every evening | ORAL | 0 refills | Status: DC | PRN

## 2019-02-01 MED ORDER — ALBUTEROL SULFATE (2.5 MG/3ML) 0.083% IN NEBU: 2.5000 mg | INHALATION_SOLUTION | Freq: Two times a day (BID) | RESPIRATORY_TRACT | 0 refills | Status: DC | PRN

## 2019-02-01 MED ORDER — DEXTROMETHORPHAN POLISTIREX ER 30 MG/5ML PO SUER
30.0000 mg | Freq: Every evening | ORAL | Status: DC | PRN
  Filled 2019-02-01: qty 5

## 2019-02-01 MED ORDER — CEFDINIR 300 MG PO CAPS: 300.0000 mg | ORAL_CAPSULE | Freq: Two times a day (BID) | ORAL | 0 refills | Status: AC

## 2019-02-01 NOTE — Progress Notes (Signed)
SATURATION QUALIFICATIONS: (This note is used to comply with regulatory documentation for home oxygen)  Patient Saturations on Room Air at Rest = 97%  Patient Saturations on Room Air while Ambulating = 89%   

## 2019-02-01 NOTE — Consult Note (Signed)
   [facility] CM Inpatient Consult   02/01/2019  SHELSEY RIETH 1933/07/08 [id number]   Patient screened for potential [facility] Care Management services due to unplanned readmission risk score of 17%, medium, and 2 hospitalizations within past 6 months.   Went to bedside to speak with Ms. Bahner regarding [place]. Patient states that she received an introduction to the program after last hospital admission and feels that she does not need the program at this time. States that she has the [facility] refrigerator magnet at home.  Explained that she may call at a later time if she changes her mind. Ms. shanette tamargo understanding.  Patient declines [facility] CM services at this time.  Netta Cedars, MSN, [facility] Liaison Nurse Mobile Phone [phone]  Toll free office [phone]

## 2019-02-01 NOTE — Discharge Instructions (Signed)
Community-Acquired Pneumonia, Adult  Pneumonia is an infection of the lungs. It causes swelling in the airways of the lungs. Mucus and fluid may also build up inside the airways.  One type of pneumonia can happen while a person is in a hospital. A different type can happen when a person is not in a hospital (community-acquired pneumonia).   What are the causes?    This condition is caused by germs (viruses, bacteria, or fungi). Some types of germs can be passed from one person to another. This can happen when you breathe in droplets from the cough or sneeze of an infected person.  What increases the risk?  You are more likely to develop this condition if you:   Have a long-term (chronic) disease, such as:  ? Chronic obstructive pulmonary disease (COPD).  ? Asthma.  ? Cystic fibrosis.  ? Congestive heart failure.  ? Diabetes.  ? Kidney disease.   Have HIV.   Have sickle cell disease.   Have had your spleen removed.   Do not take good care of your teeth and mouth (poor dental hygiene).   Have a medical condition that increases the risk of breathing in droplets from your own mouth and nose.   Have a weakened body defense system (immune system).   Are a smoker.   Travel to areas where the germs that cause this illness are common.   Are around certain animals or the places they live.  What are the signs or symptoms?   A dry cough.   A wet (productive) cough.   Fever.   Sweating.   Chest pain. This often happens when breathing deeply or coughing.   Fast breathing or trouble breathing.   Shortness of breath.   Shaking chills.   Feeling tired (fatigue).   Muscle aches.  How is this treated?  Treatment for this condition depends on many things. Most adults can be treated at home. In some cases, treatment must happen in a hospital. Treatment may include:   Medicines given by mouth or through an IV tube.   Being given extra oxygen.   Respiratory therapy.  In rare cases, treatment for very bad pneumonia  may include:   Using a machine to help you breathe.   Having a procedure to remove fluid from around your lungs.  Follow these instructions at home:  Medicines   Take over-the-counter and prescription medicines only as told by your doctor.  ? Only take cough medicine if you are losing sleep.   If you were prescribed an antibiotic medicine, take it as told by your doctor. Do not stop taking the antibiotic even if you start to feel better.  General instructions     Sleep with your head and neck raised (elevated). You can do this by sleeping in a recliner or by putting a few pillows under your head.   Rest as needed. Get at least 8 hours of sleep each night.   Drink enough water to keep your pee (urine) pale yellow.   Eat a healthy diet that includes plenty of vegetables, fruits, whole grains, low-fat dairy products, and lean protein.   Do not use any products that contain nicotine or tobacco. These include cigarettes, e-cigarettes, and chewing tobacco. If you need help quitting, ask your doctor.   Keep all follow-up visits as told by your doctor. This is important.  How is this prevented?  A shot (vaccine) can help prevent pneumonia. Shots are often suggested for:   People   older than 83 years of age.   People older than 83 years of age who:  ? Are having cancer treatment.  ? Have long-term (chronic) lung disease.  ? Have problems with their body's defense system.  You may also prevent pneumonia if you take these actions:   Get the flu (influenza) shot every year.   Go to the dentist as often as told.   Wash your hands often. If you cannot use soap and water, use hand sanitizer.  Contact a doctor if:   You have a fever.   You lose sleep because your cough medicine does not help.  Get help right away if:   You are short of breath and it gets worse.   You have more chest pain.   Your sickness gets worse. This is very serious if:  ? You are an older adult.  ? Your body's defense system is weak.   You  cough up blood.  Summary   Pneumonia is an infection of the lungs.   Most adults can be treated at home. Some will need treatment in a hospital.   Drink enough water to keep your pee pale yellow.   Get at least 8 hours of sleep each night.  This information is not intended to replace advice given to you by your health care provider. Make sure you discuss any questions you have with your health care provider.  Document Released: 04/29/2008 Document Revised: 07/09/2018 Document Reviewed: 07/09/2018  [employer]vier Interactive Patient Education  2019 [employer]

## 2019-02-01 NOTE — Discharge Summary (Addendum)
Discharge Summary  [name] JAS:[id number] DOB: Nov 01, 1933  PCP: Lisa Lima, MD  Admit date: 01/29/2019 Discharge date: 02/01/2019  Time spent: 35 minutes  Recommendations for Outpatient Follow-up:  1. Follow-up with your primary care provider 2. Follow-up with your cardiologist.  If you no longer have one, have referral from your primary care provider. 3. Take your medications as prescribed  Discharge Diagnoses:  Active Hospital Problems   Diagnosis Date Noted  . CAP (community acquired pneumonia) 01/29/2019    Resolved Hospital Problems  No resolved problems to display.    Discharge Condition: Stable  Diet recommendation: Resume previous diet  Vitals:   02/01/19 0525 02/01/19 0741  BP: (!) 137/53   Pulse: 66   Resp: 15   Temp: 98.3 F (36.8 C)   SpO2: 96% (!) 76%    History of present illness:  Lisa H Brooksis a 85 y.o.femalewith medical history significant forhypertension, chronic anxiety, GERD, obesity who presented to [facility] ED referred by her primary care provider due to new hypoxia. Patient went to see her primary care provider on day of presentation due to persistent cough and shortness of breath of one-week duration. Denies any sick contacts,tobacco use, orrecent lengthy trips. Associated with intermittent nausea and vomiting once 2 weeks prior. Also associated with sudden onset sore throat. In her PCPs office patient was found to be hypoxic 82% on room air. Not on oxygen supplementation at baseline. Due to concern for pneumonia PCP advised to go to the ED for further evaluation. Denies any chest pain. TRH asked to admit.  Upon presentation to the ED, vitals signs remarkable for oxygen saturation 82% on room air. Lab studies remarkable for mildly elevated WBC 11.4 and elevated neutrophil count. Chest x-ray independently reviewed revealed bilateral lower lobe infiltrates versus atelectasis. Patient was placed on oxygen supplementation and started  on empiric IV antibiotics for community-acquired pneumonia in the ED.  Hospital course complicated by persistent hypoxia but improving.  02/01/19: Patient seen and examined at her bedside.  No acute events overnight.  States she slept well last night and feeling better.  Denies dyspnea at rest.  Denies any chest pain.  Cough has improved with Tessalon Perles.  Home O2 evaluation revealed O2 saturation 89% on room air while ambulating, improved from prior.  On the day of discharge, the patient was hemodynamically stable.  She will need to follow-up with her primary care provider posthospitalization.  She will also need to follow-up with her cardiologist and if she does not have 1 to have a cardiologist referred by her primary care provider due to her history of chronic diastolic CHF.  No sign of acute diastolic CHF exacerbation during this admission.  BNP 144 lower than prior 292 on 08/26/2018, euvolemic, flat neck veins, no peripheral edema.   Hospital Course:  Active Problems:   CAP (community acquired pneumonia)  Improving community-acquired pneumonia, poa Patient presented with symptoms of nonproductive cough, dyspnea, hypoxia with O2 saturation 82% on room air at rest and a chest x-ray indicative of bibasilar pneumonia Completed 3 days of IV azithromycin and IV Rocephin Start oral cefdinir 300 mg twice daily x5 days Blood cultures x2 no growth to date Influenza A&B PCR negative Respiratory viral panel negative Negative group A strep Continue nebs twice daily as needed for shortness of breath and wheezing Continue Tessalon Perles 200 mg twice daily as needed for cough  Acute hypoxic respiratory failure secondary to community-acquired pneumonia Not on O2 supplementation at baseline Home O2 evaluation  done on 02/01/2019 revealed O2 saturation 89% on room air with ambulation Never smoker and no exposure to secondhand smoking Presented with O2 saturation 82% on room air at rest O2  saturation on room air at rest 97% on 02/01/2019  Generalized weakness/physical debility PT assessed and recommended no further follow-up Normal TSH Fall precautions  Resolved accelerated hypertension Continue home dose Coreg, chlorthalidone   Chronic diastolic CHF Last 2D echo done on 10/28/2013 revealed LVEF 60 to 65% with grade 1 diastolic dysfunction BNP 818 Euvolemic on exam Continue Coreg Follow-up with your cardiologist or have 1 referred to you by your primary care provider  Obesity BMI 34 Recommend weight loss outpatient when hemodynamically stable with regular physical activity and healthy dieting Follow-up with your PCP  Chronic anxiety Continue home dose 1 mg twice daily as needed  Follow-up with your PCP     Code Status:Full code     Discharge Exam: BP (!) 137/53 (BP Location: Right Arm)   Pulse 66   Temp 98.3 F (36.8 C) (Oral)   Resp 15   Ht 5\' 6"  (1.676 m)   Wt 97.9 kg   SpO2 (!) 76%   BMI 34.84 kg/m  . General: 85 y.o. year-old female well developed well nourished in no acute distress.  Alert and oriented x3. . Cardiovascular: Regular rate and rhythm with no rubs or gallops.  No thyromegaly or JVD noted.   Marland Kitchen Respiratory: Clear to auscultation with no wheezes or rales. Good inspiratory effort. . Abdomen: Soft nontender nondistended with normal bowel sounds x4 quadrants. . Musculoskeletal: No lower extremity edema. 2/4 pulses in all 4 extremities. Marland Kitchen Psychiatry: Mood is appropriate for condition and setting  Discharge Instructions You were cared for by a hospitalist during your hospital stay. If you have any questions about your discharge medications or the care you received while you were in the hospital after you are discharged, you can call the unit and asked to speak with the hospitalist on call if the hospitalist that took care of you is not available. Once you are discharged, your primary care physician will handle any further medical  issues. Please note that NO REFILLS for any discharge medications will be authorized once you are discharged, as it is imperative that you return to your primary care physician (or establish a relationship with a primary care physician if you do not have one) for your aftercare needs so that they can reassess your need for medications and monitor your lab values.   Allergies as of 02/01/2019      Reactions   Amlodipine Other (See Comments)   Reaction:  Dizziness    Enalapril Cough   Losartan Potassium Other (See Comments)   Reaction:  GI pain    Morphine And Related Itching, Nausea And Vomiting   Metformin Diarrhea      Medication List    STOP taking these medications   THERAFLU COLD & COUGH PO     TAKE these medications   acetaminophen 325 MG tablet Commonly known as:  TYLENOL Take 2 tablets (650 mg total) by mouth every 6 (six) hours as needed for moderate pain.   albuterol (2.5 MG/3ML) 0.083% nebulizer solution Commonly known as:  PROVENTIL Take 3 mLs (2.5 mg total) by nebulization 2 (two) times daily as needed for wheezing or shortness of breath.   benzonatate 200 MG capsule Commonly known as:  TESSALON Take 1 capsule (200 mg total) by mouth 2 (two) times daily as needed for cough.  calcium carbonate 600 MG Tabs tablet Commonly known as:  OS-CAL Take 600 mg by mouth 2 (two) times daily with a meal.   carvedilol 6.25 MG tablet Commonly known as:  COREG TAKE 1 TABLET BY MOUTH TWICE A DAY WITH MEALS   cefdinir 300 MG capsule Commonly known as:  OMNICEF Take 1 capsule (300 mg total) by mouth every 12 (twelve) hours for 5 days.   chlorthalidone 25 MG tablet Commonly known as:  HYGROTON TAKE 1 TABLET BY MOUTH EVERY DAY   Cholecalciferol 50 MCG (2000 UT) Tabs Take 2 tablets (4,000 Units total) by mouth daily.   clonazePAM 1 MG tablet Commonly known as:  KLONOPIN TAKE 1 TABLET BY MOUTH TWICE DAILY AS NEEDED FOR ANXIETY What changed:    reasons to take  this  additional instructions   COUGH DROPS MT Use as directed 1 lozenge in the mouth or throat 2 (two) times daily as needed (cough).   dextromethorphan 30 MG/5ML liquid Commonly known as:  DELSYM Take 5 mLs (30 mg total) by mouth at bedtime as needed for cough (if benzonatate not effective).   gabapentin 100 MG capsule Commonly known as:  NEURONTIN Take 1 capsule (100 mg total) by mouth 3 (three) times daily.   Myrbetriq 25 MG Tb24 tablet Generic drug:  mirabegron ER TAKE 1 TABLET BY MOUTH EVERY DAY What changed:  how much to take   omeprazole 40 MG capsule Commonly known as:  PRILOSEC TAKE 1 CAPSULE BY MOUTH EVERY DAY What changed:  how much to take   traZODone 50 MG tablet Commonly known as:  DESYREL Take 1 tablet (50 mg total) by mouth at bedtime. What changed:    medication strength  how much to take            Durable Medical Equipment  (From admission, onward)         Start     Ordered   02/01/19 0935  For home use only DME Nebulizer machine  Once    Question:  Patient needs a nebulizer to treat with the following condition  Answer:  CAP (community acquired pneumonia)   02/01/19 0935         Allergies  Allergen Reactions  . Amlodipine Other (See Comments)    Reaction:  Dizziness   . Enalapril Cough  . Losartan Potassium Other (See Comments)    Reaction:  GI pain   . Morphine And Related Itching and Nausea And Vomiting  . Metformin Diarrhea   Follow-up Information    Lisa Lima, MD. Call in 1 day(s).   Specialty:  Internal Medicine Why:  Please call for a post hospital follow-up appointment. Contact information: [address] [address] [phone]            The results of significant diagnostics from this hospitalization (including imaging, microbiology, ancillary and laboratory) are listed below for reference.    Significant Diagnostic Studies: Dg Chest 2 View  Result Date: 01/29/2019 CLINICAL  DATA:  Shortness of breath and hypoxia EXAM: CHEST - 2 VIEW COMPARISON:  08/25/2018 FINDINGS: Mild cardiomegaly with central pulmonary vascular congestion. No overt edema. No pleural effusion or pneumothorax. Mildly increased opacity at the right costophrenic angle. IMPRESSION: 1. Increased opacity at the right costophrenic angle may indicate developing consolidation or atelectasis. 2. Mild cardiomegaly with central pulmonary vascular congestion. Electronically Signed   By: Ulyses Jarred M.D.   On: 01/29/2019 16:12    Microbiology: Recent Results (from  the past 240 hour(s))  Blood culture (routine x 2)     Status: None (Preliminary result)   Collection Time: 01/29/19  4:40 PM  Result Value Ref Range Status   Specimen Description   Final    BLOOD LEFT ANTECUBITAL Performed at [facility] [address], [place], [place] [address]    Special Requests   Final    Blood Culture adequate volume BOTTLES DRAWN AEROBIC AND ANAEROBIC   Culture   Final    NO GROWTH 3 DAYS Performed at [facility], [place] [address], [place], [place] [address]    Report Status PENDING  Incomplete  Respiratory Panel by PCR     Status: None   Collection Time: 01/29/19  6:41 PM  Result Value Ref Range Status   Adenovirus NOT DETECTED NOT DETECTED Final   Coronavirus 229E NOT DETECTED NOT DETECTED Final    Comment: (NOTE) The Coronavirus on the Respiratory Panel, DOES NOT test for the novel  Coronavirus (2019 nCoV)    Coronavirus HKU1 NOT DETECTED NOT DETECTED Final   Coronavirus NL63 NOT DETECTED NOT DETECTED Final   Coronavirus OC43 NOT DETECTED NOT DETECTED Final   Metapneumovirus NOT DETECTED NOT DETECTED Final   Rhinovirus / Enterovirus NOT DETECTED NOT DETECTED Final   Influenza A NOT DETECTED NOT DETECTED Final   Influenza B NOT DETECTED NOT DETECTED Final   Parainfluenza Virus 1 NOT DETECTED NOT DETECTED Final   Parainfluenza Virus 2 NOT DETECTED NOT DETECTED Final    Parainfluenza Virus 3 NOT DETECTED NOT DETECTED Final   Parainfluenza Virus 4 NOT DETECTED NOT DETECTED Final   Respiratory Syncytial Virus NOT DETECTED NOT DETECTED Final   Bordetella pertussis NOT DETECTED NOT DETECTED Final   Chlamydophila pneumoniae NOT DETECTED NOT DETECTED Final   Mycoplasma pneumoniae NOT DETECTED NOT DETECTED Final    Comment: Performed at [facility], [address] [place], [place] [address]  Group A Strep by PCR     Status: None   Collection Time: 01/29/19  6:41 PM  Result Value Ref Range Status   Group A Strep by PCR NOT DETECTED NOT DETECTED Final    Comment: Performed at [facility], [place] [address], [place], [place] [address]  Blood culture (routine x 2)     Status: None (Preliminary result)   Collection Time: 01/29/19  8:16 PM  Result Value Ref Range Status   Specimen Description   Final    BLOOD RIGHT ANTECUBITAL Performed at [facility] [address] [place], [place] [address]    Special Requests   Final    BOTTLES DRAWN AEROBIC ONLY Blood Culture adequate volume Performed at [facility] [address] [place], [place] [address]    Culture   Final    NO GROWTH 3 DAYS Performed at [facility], [place] [address], [place],  [address]    Report Status PENDING  Incomplete     Labs: Basic Metabolic Panel: Recent Labs  Lab 01/29/19 1629 01/30/19 0527 02/01/19 0540  NA 139 140 139  K 4.8 3.9 3.5  CL 96* 95* 96*  CO2 36* 37* 35*  GLUCOSE 98 108* 106*  BUN 11 12 24*  CREATININE 0.78 0.64 0.72  CALCIUM 8.7* 9.0 9.5  MG  --  1.6* 1.8  PHOS  --  3.1  --    Liver Function Tests: No results for input(s): AST, ALT, ALKPHOS, BILITOT, PROT, ALBUMIN in  the last 168 hours. No results for input(s): LIPASE, AMYLASE in the last 168 hours. No results for input(s): AMMONIA in the last 168 hours. CBC: Recent Labs  Lab 01/29/19 1528 01/30/19 0527 02/01/19 0540  WBC  11.4* 11.1* 12.3*  NEUTROABS 7.8* 9.2* 8.6*  HGB 12.1 12.2 12.6  HCT 41.7 41.7 42.3  MCV 101.0* 98.8 97.7  PLT 147* 141* 157   Cardiac Enzymes: No results for input(s): CKTOTAL, CKMB, CKMBINDEX, TROPONINI in the last 168 hours. BNP: BNP (last 3 results) Recent Labs    08/26/18 0116 01/29/19 1528  BNP 292.5* 144.2*    ProBNP (last 3 results) No results for input(s): PROBNP in the last 8760 hours.  CBG: No results for input(s): GLUCAP in the last 168 hours.     Signed:  Kayleen Memos, MD [facility] 02/01/2019, 12:12 PM

## 2019-02-01 NOTE — Care Management Note (Signed)
Case Management Note  Patient Details  Name: [name] MRN: [id number] Date of Birth: Nov 25, 1933     Expected Discharge Date:  02/01/19               Expected Discharge Plan:     In-House Referral:     Discharge planning Services     Post Acute Care Choice:    Choice offered to:     DME Arranged:   Nebulizer machine DME Agency:   [facility]  HH Arranged:    [place] Agency:     Status of Service:     If discussed at [place] of Stay Meetings, dates discussed:    Additional Comments:  Lynnell Catalan, RN 02/01/2019, 11:29 AM

## 2019-02-01 NOTE — Progress Notes (Signed)
Patient given discharge instructions, and verbalized an understanding of all discharge instructions.  Patient agrees with discharge plan, and is being discharged in stable medical condition.  Patient given transportation via wheelchair. 

## 2019-02-02 ENCOUNTER — Telehealth: Payer: Self-pay | Admitting: *Deleted

## 2019-02-02 ENCOUNTER — Other Ambulatory Visit: Payer: Self-pay | Admitting: Internal Medicine

## 2019-02-02 DIAGNOSIS — J189 Pneumonia, unspecified organism: Secondary | ICD-10-CM | POA: Diagnosis not present

## 2019-02-02 DIAGNOSIS — I10 Essential (primary) hypertension: Secondary | ICD-10-CM

## 2019-02-02 NOTE — Telephone Encounter (Signed)
Pt was on TCM report tried calling pt to make hosp follow-up there was no answer LMOM RTC. (CRM created)...[name]

## 2019-02-03 ENCOUNTER — Other Ambulatory Visit: Payer: Self-pay | Admitting: Internal Medicine

## 2019-02-03 LAB — CULTURE, BLOOD (ROUTINE X 2)
CULTURE: NO GROWTH
Culture: NO GROWTH
SPECIAL REQUESTS: ADEQUATE
Special Requests: ADEQUATE

## 2019-02-03 MED ORDER — OSELTAMIVIR PHOSPHATE 75 MG PO CAPS: 75.0000 mg | ORAL_CAPSULE | Freq: Every day | ORAL | 0 refills | Status: DC

## 2019-02-03 NOTE — Telephone Encounter (Signed)
Called pt back concerning msg on yesterday. Pt states she called back already and made appt for next Tues (3/17). Verified appt in chart, also inform pt had some aditoonal questions concerning discharge. Completed YCM call below../lm,b  Transition Care Management Follow-up Telephone Call   Date discharged? 02/01/19   How have you been since you were released from the hospital? Pt states she is feeling alright   Do you understand why you were in the hospital? YES   Do you understand the discharge instructions? YES   Where were you discharged to? Home   Items Reviewed:  Medications reviewed: YES, pt states all her medications are still the same  Allergies reviewed: YES  Dietary changes reviewed: YES, heart healthy  Referrals reviewed: YES, pt states she will need a referral to see cardiologist. Inform pt at her visit MD can send referral at that time   Functional Questionnaire:   Activities of Daily Living (ADLs):   She states she are independent in the following: ambulation, bathing and hygiene, feeding, continence, grooming, toileting and dressing States she doesn't require assistance    Any transportation issues/concerns?: NO   Any patient concerns? NO   Confirmed importance and date/time of follow-up visits scheduled YES, appt 02/09/19  Provider Appointment booked with Dr. Ronnald Ramp  Confirmed with patient if condition begins to worsen call PCP or go to the ER.  Patient was given the office number and encouraged to call back with question or concerns.  : YES

## 2019-02-09 ENCOUNTER — Other Ambulatory Visit: Payer: Self-pay

## 2019-02-09 ENCOUNTER — Ambulatory Visit (INDEPENDENT_AMBULATORY_CARE_PROVIDER_SITE_OTHER)
Admission: RE | Admit: 2019-02-09 | Discharge: 2019-02-09 | Disposition: A | Discharge status: Home / Self Care | Payer: Medicare HMO | Source: Ambulatory Visit | Attending: Internal Medicine | Admitting: Internal Medicine

## 2019-02-09 ENCOUNTER — Ambulatory Visit (INDEPENDENT_AMBULATORY_CARE_PROVIDER_SITE_OTHER): Payer: Medicare HMO | Admitting: Internal Medicine

## 2019-02-09 ENCOUNTER — Other Ambulatory Visit: Payer: Self-pay | Admitting: *Deleted

## 2019-02-09 ENCOUNTER — Encounter: Payer: Self-pay | Admitting: Internal Medicine

## 2019-02-09 VITALS — BP 154/80 | HR 68 | Temp 97.8°F | Ht 66.0 in | Wt 221.0 lb

## 2019-02-09 DIAGNOSIS — I10 Essential (primary) hypertension: Secondary | ICD-10-CM | POA: Diagnosis not present

## 2019-02-09 DIAGNOSIS — J181 Lobar pneumonia, unspecified organism: Secondary | ICD-10-CM

## 2019-02-09 DIAGNOSIS — J189 Pneumonia, unspecified organism: Secondary | ICD-10-CM

## 2019-02-09 DIAGNOSIS — R05 Cough: Secondary | ICD-10-CM | POA: Diagnosis not present

## 2019-02-09 DIAGNOSIS — F411 Generalized anxiety disorder: Secondary | ICD-10-CM

## 2019-02-09 MED ORDER — TRAZODONE HCL 100 MG PO TABS: 100.0000 mg | ORAL_TABLET | Freq: Every day | ORAL | 1 refills | Status: DC

## 2019-02-09 NOTE — Patient Instructions (Signed)
Community-Acquired Pneumonia, Adult  Pneumonia is an infection of the lungs. It causes swelling in the airways of the lungs. Mucus and fluid may also build up inside the airways.  One type of pneumonia can happen while a person is in a hospital. A different type can happen when a person is not in a hospital (community-acquired pneumonia).   What are the causes?    This condition is caused by germs (viruses, bacteria, or fungi). Some types of germs can be passed from one person to another. This can happen when you breathe in droplets from the cough or sneeze of an infected person.  What increases the risk?  You are more likely to develop this condition if you:   Have a long-term (chronic) disease, such as:  ? Chronic obstructive pulmonary disease (COPD).  ? Asthma.  ? Cystic fibrosis.  ? Congestive heart failure.  ? Diabetes.  ? Kidney disease.   Have HIV.   Have sickle cell disease.   Have had your spleen removed.   Do not take good care of your teeth and mouth (poor dental hygiene).   Have a medical condition that increases the risk of breathing in droplets from your own mouth and nose.   Have a weakened body defense system (immune system).   Are a smoker.   Travel to areas where the germs that cause this illness are common.   Are around certain animals or the places they live.  What are the signs or symptoms?   A dry cough.   A wet (productive) cough.   Fever.   Sweating.   Chest pain. This often happens when breathing deeply or coughing.   Fast breathing or trouble breathing.   Shortness of breath.   Shaking chills.   Feeling tired (fatigue).   Muscle aches.  How is this treated?  Treatment for this condition depends on many things. Most adults can be treated at home. In some cases, treatment must happen in a hospital. Treatment may include:   Medicines given by mouth or through an IV tube.   Being given extra oxygen.   Respiratory therapy.  In rare cases, treatment for very bad pneumonia  may include:   Using a machine to help you breathe.   Having a procedure to remove fluid from around your lungs.  Follow these instructions at home:  Medicines   Take over-the-counter and prescription medicines only as told by your doctor.  ? Only take cough medicine if you are losing sleep.   If you were prescribed an antibiotic medicine, take it as told by your doctor. Do not stop taking the antibiotic even if you start to feel better.  General instructions     Sleep with your head and neck raised (elevated). You can do this by sleeping in a recliner or by putting a few pillows under your head.   Rest as needed. Get at least 8 hours of sleep each night.   Drink enough water to keep your pee (urine) pale yellow.   Eat a healthy diet that includes plenty of vegetables, fruits, whole grains, low-fat dairy products, and lean protein.   Do not use any products that contain nicotine or tobacco. These include cigarettes, e-cigarettes, and chewing tobacco. If you need help quitting, ask your doctor.   Keep all follow-up visits as told by your doctor. This is important.  How is this prevented?  A shot (vaccine) can help prevent pneumonia. Shots are often suggested for:   People   older than 83 years of age.   People older than 83 years of age who:  ? Are having cancer treatment.  ? Have long-term (chronic) lung disease.  ? Have problems with their body's defense system.  You may also prevent pneumonia if you take these actions:   Get the flu (influenza) shot every year.   Go to the dentist as often as told.   Wash your hands often. If you cannot use soap and water, use hand sanitizer.  Contact a doctor if:   You have a fever.   You lose sleep because your cough medicine does not help.  Get help right away if:   You are short of breath and it gets worse.   You have more chest pain.   Your sickness gets worse. This is very serious if:  ? You are an older adult.  ? Your body's defense system is weak.   You  cough up blood.  Summary   Pneumonia is an infection of the lungs.   Most adults can be treated at home. Some will need treatment in a hospital.   Drink enough water to keep your pee pale yellow.   Get at least 8 hours of sleep each night.  This information is not intended to replace advice given to you by your health care provider. Make sure you discuss any questions you have with your health care provider.  Document Released: 04/29/2008 Document Revised: 07/09/2018 Document Reviewed: 07/09/2018  [employer]vier Interactive Patient Education  2019 [employer]

## 2019-02-09 NOTE — Progress Notes (Signed)
Subjective:  Patient ID: [name], female    DOB: [birth year]  Age: 85 y.o. MRN: [id number]  CC: Cough   HPI Luvia CENIYAH THORP presents for a hosp f/up - She was recently admitted for the treatment of right lower lobe pneumonia.  She tells me that she is getting better.  She has a cough that is productive of white phlegm but the cough is improving.  She has rare episodes of shortness of breath and she denies fever, chills, night sweats, chest pain, or hemoptysis.  She complains of chronic, diffuse weakness.  Admit date: 01/29/2019 Discharge date: 02/01/2019  Time spent: 35 minutes  Recommendations for Outpatient Follow-up:  1. Follow-up with your primary care provider 2. Follow-up with your cardiologist.  If you no longer have one, have referral from your primary care provider. 3. Take your medications as prescribed  Discharge Diagnoses:      Active Hospital Problems   Diagnosis Date Noted  . CAP (community acquired pneumonia) 01/29/2019       Outpatient Medications Prior to Visit  Medication Sig Dispense Refill  . acetaminophen (TYLENOL) 325 MG tablet Take 2 tablets (650 mg total) by mouth every 6 (six) hours as needed for moderate pain.    Marland Kitchen albuterol (PROVENTIL) (2.5 MG/3ML) 0.083% nebulizer solution Take 3 mLs (2.5 mg total) by nebulization 2 (two) times daily as needed for wheezing or shortness of breath. 75 mL 0  . benzonatate (TESSALON) 200 MG capsule Take 1 capsule (200 mg total) by mouth 2 (two) times daily as needed for cough. 20 capsule 0  . calcium carbonate (OS-CAL) 600 MG TABS tablet Take 600 mg by mouth 2 (two) times daily with a meal.    . carvedilol (COREG) 6.25 MG tablet Take 1 tablet (6.25 mg total) by mouth 2 (two) times daily with a meal. 180 tablet 1  . chlorthalidone (HYGROTON) 25 MG tablet TAKE 1 TABLET BY MOUTH EVERY DAY (Patient taking differently: Take 25 mg by mouth daily. ) 90 tablet 1  . Cholecalciferol 2000 units TABS Take 2 tablets (4,000 Units  total) by mouth daily. 180 tablet 1  . clonazePAM (KLONOPIN) 1 MG tablet TAKE 1 TABLET BY MOUTH TWICE DAILY AS NEEDED FOR ANXIETY (Patient taking differently: Take 1 mg by mouth 2 (two) times daily as needed for anxiety. ) 60 tablet 3  . dextromethorphan (DELSYM) 30 MG/5ML liquid Take 5 mLs (30 mg total) by mouth at bedtime as needed for cough (if benzonatate not effective). 89 mL 0  . gabapentin (NEURONTIN) 100 MG capsule Take 1 capsule (100 mg total) by mouth 3 (three) times daily. 90 capsule 3  . MYRBETRIQ 25 MG TB24 tablet TAKE 1 TABLET BY MOUTH EVERY DAY (Patient taking differently: Take 25 mg by mouth daily. ) 90 tablet 1  . omeprazole (PRILOSEC) 40 MG capsule TAKE 1 CAPSULE BY MOUTH EVERY DAY (Patient taking differently: Take 40 mg by mouth daily. ) 90 capsule 1  . Throat Lozenges (COUGH DROPS MT) Use as directed 1 lozenge in the mouth or throat 2 (two) times daily as needed (cough).    . traZODone (DESYREL) 50 MG tablet Take 1 tablet (50 mg total) by mouth at bedtime. 30 tablet 0  . oseltamivir (TAMIFLU) 75 MG capsule Take 1 capsule (75 mg total) by mouth daily. 10 capsule 0   No facility-administered medications prior to visit.     ROS Review of Systems  Constitutional: Negative for chills, diaphoresis, fatigue and fever.  HENT:  Negative.   Respiratory: Positive for cough and shortness of breath. Negative for chest tightness and wheezing.   Cardiovascular: Negative for palpitations and leg swelling.  Gastrointestinal: Negative for abdominal pain, diarrhea, nausea and vomiting.  Endocrine: Negative.   Genitourinary: Negative.  Negative for difficulty urinating.  Musculoskeletal: Negative.  Negative for arthralgias and myalgias.  Skin: Negative.  Negative for color change and pallor.  Neurological: Positive for weakness. Negative for dizziness.  Hematological: Negative for adenopathy. Does not bruise/bleed easily.  Psychiatric/Behavioral: Positive for dysphoric mood and sleep  disturbance. Negative for suicidal ideas. The patient is nervous/anxious.     Objective:  BP (!) 154/80 (BP Location: Left Arm, Patient Position: Sitting, Cuff Size: Normal)   Pulse 68   Temp 97.8 F (36.6 C) (Oral)   Ht 5\' 6"  (1.676 m)   Wt 221 lb (100.2 kg)   SpO2 92%   BMI 35.67 kg/m   BP Readings from Last 3 Encounters:  02/09/19 (!) 154/80  02/01/19 (!) 137/53  01/29/19 140/78    Wt Readings from Last 3 Encounters:  02/09/19 221 lb (100.2 kg)  02/01/19 215 lb 13.3 oz (97.9 kg)  01/29/19 224 lb (101.6 kg)    Physical Exam Vitals signs reviewed.  Constitutional:      General: She is not in acute distress.    Appearance: She is obese. She is not ill-appearing, toxic-appearing or diaphoretic.  HENT:     Nose: Nose normal.     Mouth/Throat:     Mouth: Mucous membranes are moist.     Pharynx: No oropharyngeal exudate or posterior oropharyngeal erythema.  Eyes:     General: No scleral icterus.    Conjunctiva/sclera: Conjunctivae normal.  Neck:     Musculoskeletal: Normal range of motion and neck supple. No muscular tenderness.  Cardiovascular:     Rate and Rhythm: Normal rate and regular rhythm.     Heart sounds: No murmur.  Pulmonary:     Effort: Pulmonary effort is normal. No tachypnea.     Breath sounds: No stridor. No decreased breath sounds, wheezing, rhonchi or rales.  Abdominal:     General: Bowel sounds are normal. There is no distension.     Palpations: There is no hepatomegaly, splenomegaly or mass.     Tenderness: There is no guarding.  Musculoskeletal: Normal range of motion.        General: No swelling.     Right lower leg: No edema.     Left lower leg: No edema.  Lymphadenopathy:     Cervical: No cervical adenopathy.  Skin:    General: Skin is warm and dry.  Neurological:     General: No focal deficit present.     Mental Status: She is oriented to person, place, and time. Mental status is at baseline.     Lab Results  Component Value Date    WBC 12.3 (H) 02/01/2019   HGB 12.6 02/01/2019   HCT 42.3 02/01/2019   PLT 157 02/01/2019   GLUCOSE 106 (H) 02/01/2019   CHOL 194 08/26/2018   TRIG 95 08/26/2018   HDL 69 08/26/2018   LDLDIRECT 121.0 06/12/2017   LDLCALC 106 (H) 08/26/2018   ALT 16 08/25/2018   AST 34 08/25/2018   NA 139 02/01/2019   K 3.5 02/01/2019   CL 96 (L) 02/01/2019   CREATININE 0.72 02/01/2019   BUN 24 (H) 02/01/2019   CO2 35 (H) 02/01/2019   TSH 2.350 08/25/2018   INR 1.03 08/25/2018   HGBA1C  5.0 08/26/2018    Dg Chest 2 View  Result Date: 01/29/2019 CLINICAL DATA:  Shortness of breath and hypoxia EXAM: CHEST - 2 VIEW COMPARISON:  08/25/2018 FINDINGS: Mild cardiomegaly with central pulmonary vascular congestion. No overt edema. No pleural effusion or pneumothorax. Mildly increased opacity at the right costophrenic angle. IMPRESSION: 1. Increased opacity at the right costophrenic angle may indicate developing consolidation or atelectasis. 2. Mild cardiomegaly with central pulmonary vascular congestion. Electronically Signed   By: Ulyses Jarred M.D.   On: 01/29/2019 16:12   Dg Chest 2 View  Result Date: 02/09/2019 CLINICAL DATA:  Cough and weakness EXAM: CHEST - 2 VIEW COMPARISON:  01/29/2019 FINDINGS: Cardiac shadow remains mildly enlarged. Aortic calcifications are again seen. No significant vascular congestion is noted. Previously seen right basilar changes have improved in the interval. No sizable effusion is noted. No bony abnormality is seen. IMPRESSION: Resolution of previously seen right basilar infiltrate. Electronically Signed   By: Inez Catalina M.D.   On: 02/09/2019 19:39     Assessment & Plan:   Keerthana was seen today for cough.  Diagnoses and all orders for this visit:  Pneumonia of right lower lobe due to infectious organism [facility])- Based on her symptoms, exam, and CXR this has resolved. -     DG Chest 2 View; Future -     CBC with Differential/Platelet; Future  Essential hypertension- Her  blood pressure is well controlled.  I will monitor her electrolytes and renal function. -     CBC with Differential/Platelet; Future -     Basic metabolic panel; Future  Hypercalcemia- Her recent calcium levels have been normal. -     Basic metabolic panel; Future  GAD (generalized anxiety disorder) -     traZODone (DESYREL) 100 MG tablet; Take 1 tablet (100 mg total) by mouth at bedtime.   I have discontinued Adella H. Martelle's traZODone and oseltamivir. I am also having her start on traZODone. Additionally, I am having her maintain her calcium carbonate, Cholecalciferol, acetaminophen, chlorthalidone, Myrbetriq, omeprazole, clonazePAM, gabapentin, Throat Lozenges (COUGH DROPS MT), benzonatate, albuterol, dextromethorphan, and carvedilol.  Meds ordered this encounter  Medications  . traZODone (DESYREL) 100 MG tablet    Sig: Take 1 tablet (100 mg total) by mouth at bedtime.    Dispense:  90 tablet    Refill:  1     Follow-up: Return if symptoms worsen or fail to improve.  Scarlette Calico, MD

## 2019-02-09 NOTE — Patient Outreach (Signed)
[facility]) Care Management  02/09/2019  MAEBEL MARASCO 1933/02/12 [id number]   Subjective: Telephone call to patient's home number, spoke with patient, and HIPAA verified.  Discussed [place] Medicare EMMI General Discharge follow up, patient voiced understanding, and is in agreement to follow up.  Patient states remember received EMMI automated calls, does not have any wound healing issues, and has no questions for this RNCM at this time.   States she is feeling very weak, not up to talking much, and getting ready to go to her hospital follow up appointment with Dr. Ronnald Ramp.   Patient planning to discuss her weakness with Dr. Ronnald Ramp during follow up appointment.  Patient states she does not have any education material, EMMI follow up, care coordination, care management, disease monitoring, transportation, community resource, or pharmacy needs at this time.  States she is very appreciative of the follow up and is in agreement to receive [place] Management information.      Objective: Per KPN (Knowledge Performance Now, point of care tool) and chart review, patient hospitalized 01/29/2019 -02/01/2019 for CAP* (community acquired pneumonia).  Patient also has a history of hypertension and chronic diastolic congestive heart failure.     Assessment: Received [facility] EMMI General Discharge Red Alert follow up referral on 02/08/2019.   Red Alert flag triggers, Day #4, patient answered no to the following question: Wounds healing well?  Patient answered yes to the following question: Other questions/problems?    EMMI follow up completed and no further care management needs.      Plan: RNCM will send patient successful outreach letter, [facility] pamphlet, and magnet. RNCM will complete case closure due to follow up completed / no care management needs.       Abie Killian H. Annia Friendly, BSN, [place] [facility] Telephonic CM Phone: [phone] Fax: [phone]

## 2019-03-05 DIAGNOSIS — J189 Pneumonia, unspecified organism: Secondary | ICD-10-CM | POA: Diagnosis not present

## 2019-03-18 ENCOUNTER — Encounter: Payer: Self-pay | Admitting: Internal Medicine

## 2019-03-18 ENCOUNTER — Ambulatory Visit (INDEPENDENT_AMBULATORY_CARE_PROVIDER_SITE_OTHER): Payer: Medicare HMO | Admitting: Internal Medicine

## 2019-03-18 DIAGNOSIS — I5032 Chronic diastolic (congestive) heart failure: Secondary | ICD-10-CM | POA: Diagnosis not present

## 2019-03-18 DIAGNOSIS — N3281 Overactive bladder: Secondary | ICD-10-CM

## 2019-03-18 DIAGNOSIS — J4520 Mild intermittent asthma, uncomplicated: Secondary | ICD-10-CM

## 2019-03-18 DIAGNOSIS — I10 Essential (primary) hypertension: Secondary | ICD-10-CM

## 2019-03-18 MED ORDER — TORSEMIDE 10 MG PO TABS: 10.0000 mg | ORAL_TABLET | Freq: Every day | ORAL | 0 refills | Status: DC

## 2019-03-18 MED ORDER — MIRABEGRON ER 50 MG PO TB24: 50.0000 mg | ORAL_TABLET | Freq: Every day | ORAL | 1 refills | Status: DC

## 2019-03-18 MED ORDER — ALBUTEROL SULFATE (2.5 MG/3ML) 0.083% IN NEBU: 2.5000 mg | INHALATION_SOLUTION | Freq: Two times a day (BID) | RESPIRATORY_TRACT | 2 refills | Status: DC | PRN

## 2019-03-18 NOTE — Progress Notes (Signed)
Virtual Visit via Video Note  I connected with [name] on 03/18/19 at  1:00 PM EDT by a video enabled telemedicine application and verified that I am speaking with the correct person using two identifiers.   I discussed the limitations of evaluation and management by telemedicine and the availability of in person appointments. The patient expressed understanding and agreed to proceed.  History of Present Illness: She checked in for virtual visit.  She was not willing to come in because the COVID-19 pandemic.  She complains that her blood pressure is not well controlled.  She recently checked it and it was 165/79.  It sounds like she is taking carvedilol but is no longer taking chlorthalidone. She also complains of lower extremity edema, dizziness, and fatigue.  She denies chest pain, shortness of breath, or palpitations.  She complains of intermittent wheezing and wants a refill of albuterol.  She continues to struggle with frequent urination and urinary incontinence and wants to know if she can increase the dose of Myrbetriq.    Observations/Objective: Neurologically she was at her baseline - mildly confused and tangential.  There were no signs of distress.  Her skin color looks normal.  She was alert, appropriate, and calm.  Lab Results  Component Value Date   WBC 12.3 (H) 02/01/2019   HGB 12.6 02/01/2019   HCT 42.3 02/01/2019   PLT 157 02/01/2019   GLUCOSE 106 (H) 02/01/2019   CHOL 194 08/26/2018   TRIG 95 08/26/2018   HDL 69 08/26/2018   LDLDIRECT 121.0 06/12/2017   LDLCALC 106 (H) 08/26/2018   ALT 16 08/25/2018   AST 34 08/25/2018   NA 139 02/01/2019   K 3.5 02/01/2019   CL 96 (L) 02/01/2019   CREATININE 0.72 02/01/2019   BUN 24 (H) 02/01/2019   CO2 35 (H) 02/01/2019   TSH 2.350 08/25/2018   INR 1.03 08/25/2018   HGBA1C 5.0 08/26/2018     Assessment and Plan: Her blood pressure is not adequately well controlled and she has a history of hypercalcemia.  I have asked to be  certain that she is no longer taking chlorthalidone.  Will try to control her blood pressure and edema with a loop diuretic.  Will continue the current dose of carvedilol.  I have increased the Myrbetriq dose from 25 a day to 50 mg a day.  I sent in a refill for the nebulized albuterol.   Follow Up Instructions: She agrees to comply with the above recommendations.  She will continue to monitor her blood pressure and other symptoms.  She will let me know if she develops any new or worsening symptoms.  She agrees to come in for an in person visit in 2 to 3 months for me to recheck her blood pressure.    I discussed the assessment and treatment plan with the patient. The patient was provided an opportunity to ask questions and all were answered. The patient agreed with the plan and demonstrated an understanding of the instructions.   The patient was advised to call back or seek an in-person evaluation if the symptoms worsen or if the condition fails to improve as anticipated.  I provided 25 minutes of non-face-to-face time during this encounter.   Scarlette Calico, MD

## 2019-04-04 DIAGNOSIS — J189 Pneumonia, unspecified organism: Secondary | ICD-10-CM | POA: Diagnosis not present

## 2019-04-15 ENCOUNTER — Ambulatory Visit: Payer: Medicare HMO | Admitting: Internal Medicine

## 2019-04-15 ENCOUNTER — Encounter: Payer: Self-pay | Admitting: Internal Medicine

## 2019-04-15 ENCOUNTER — Telehealth: Payer: Self-pay

## 2019-04-15 ENCOUNTER — Ambulatory Visit (INDEPENDENT_AMBULATORY_CARE_PROVIDER_SITE_OTHER): Payer: Medicare HMO | Admitting: Internal Medicine

## 2019-04-15 ENCOUNTER — Telehealth: Payer: Self-pay | Admitting: Internal Medicine

## 2019-04-15 ENCOUNTER — Other Ambulatory Visit: Payer: Self-pay

## 2019-04-15 VITALS — BP 164/80 | HR 74 | Temp 98.2°F | Resp 16 | Ht 66.0 in | Wt 232.0 lb

## 2019-04-15 DIAGNOSIS — I10 Essential (primary) hypertension: Secondary | ICD-10-CM | POA: Diagnosis not present

## 2019-04-15 DIAGNOSIS — R300 Dysuria: Secondary | ICD-10-CM

## 2019-04-15 DIAGNOSIS — N3001 Acute cystitis with hematuria: Secondary | ICD-10-CM | POA: Diagnosis not present

## 2019-04-15 LAB — POC URINALSYSI DIPSTICK (AUTOMATED)
Bilirubin, UA: NEGATIVE
Blood, UA: POSITIVE
Glucose, UA: NEGATIVE
Ketones, UA: NEGATIVE
Nitrite, UA: NEGATIVE
Protein, UA: NEGATIVE
Spec Grav, UA: 1.015 (ref 1.010–1.025)
Urobilinogen, UA: 0.2 E.U./dL
pH, UA: 7.5 (ref 5.0–8.0)

## 2019-04-15 MED ORDER — NITROFURANTOIN MONOHYD MACRO 100 MG PO CAPS: 100.0000 mg | ORAL_CAPSULE | Freq: Two times a day (BID) | ORAL | 0 refills | Status: AC

## 2019-04-15 MED ORDER — HYDRALAZINE HCL 50 MG PO TABS: 50.0000 mg | ORAL_TABLET | Freq: Four times a day (QID) | ORAL | 5 refills | Status: DC

## 2019-04-15 MED ORDER — PROMETHAZINE HCL 12.5 MG PO TABS: 12.5000 mg | ORAL_TABLET | Freq: Four times a day (QID) | ORAL | 0 refills | Status: DC | PRN

## 2019-04-15 NOTE — Progress Notes (Signed)
Subjective:  Patient ID: [name], female    DOB: 06-25-1933  Age: 85 y.o. MRN: [id number]  CC: Hypertension and Urinary Tract Infection   HPI NIKAYA NASBY presents for for concerns about a 3-day history of dysuria.  Outpatient Medications Prior to Visit  Medication Sig Dispense Refill  . acetaminophen (TYLENOL) 325 MG tablet Take 2 tablets (650 mg total) by mouth every 6 (six) hours as needed for moderate pain.    Marland Kitchen albuterol (PROVENTIL) (2.5 MG/3ML) 0.083% nebulizer solution Take 3 mLs (2.5 mg total) by nebulization 2 (two) times daily as needed for wheezing or shortness of breath. 75 mL 2  . carvedilol (COREG) 6.25 MG tablet Take 1 tablet (6.25 mg total) by mouth 2 (two) times daily with a meal. 180 tablet 1  . Cholecalciferol 2000 units TABS Take 2 tablets (4,000 Units total) by mouth daily. 180 tablet 1  . clonazePAM (KLONOPIN) 1 MG tablet TAKE 1 TABLET BY MOUTH TWICE DAILY AS NEEDED FOR ANXIETY (Patient taking differently: Take 1 mg by mouth 2 (two) times daily as needed for anxiety. ) 60 tablet 3  . gabapentin (NEURONTIN) 100 MG capsule Take 1 capsule (100 mg total) by mouth 3 (three) times daily. 90 capsule 3  . mirabegron ER (MYRBETRIQ) 50 MG TB24 tablet Take 1 tablet (50 mg total) by mouth daily. 90 tablet 1  . omeprazole (PRILOSEC) 40 MG capsule TAKE 1 CAPSULE BY MOUTH EVERY DAY (Patient taking differently: Take 40 mg by mouth daily. ) 90 capsule 1  . torsemide (DEMADEX) 10 MG tablet Take 1 tablet (10 mg total) by mouth daily. 90 tablet 0  . traZODone (DESYREL) 100 MG tablet Take 1 tablet (100 mg total) by mouth at bedtime. 90 tablet 1   No facility-administered medications prior to visit.     ROS Review of Systems  Constitutional: Negative for chills, fatigue and fever.  HENT: Negative.   Eyes: Negative for photophobia and visual disturbance.  Respiratory: Negative for cough, chest tightness, shortness of breath and wheezing.   Cardiovascular: Negative for chest pain,  palpitations and leg swelling.  Gastrointestinal: Positive for nausea. Negative for abdominal pain, constipation, diarrhea and vomiting.  Endocrine: Negative.   Genitourinary: Positive for dysuria. Negative for difficulty urinating, flank pain, hematuria and urgency.  Musculoskeletal: Negative.  Negative for arthralgias and myalgias.  Skin: Negative.  Negative for color change.  Neurological: Positive for headaches. Negative for dizziness, weakness and light-headedness.  Hematological: Negative for adenopathy. Does not bruise/bleed easily.  Psychiatric/Behavioral: Negative.     Objective:  BP (!) 164/80 (BP Location: Left Arm, Patient Position: Sitting, Cuff Size: Large)   Pulse 74   Temp 98.2 F (36.8 C) (Oral)   Resp 16   Ht 5\' 6"  (1.676 m)   Wt 232 lb (105.2 kg)   SpO2 96%   BMI 37.45 kg/m   BP Readings from Last 3 Encounters:  04/15/19 (!) 164/80  02/09/19 (!) 154/80  02/01/19 (!) 137/53    Wt Readings from Last 3 Encounters:  04/15/19 232 lb (105.2 kg)  02/09/19 221 lb (100.2 kg)  02/01/19 215 lb 13.3 oz (97.9 kg)    Physical Exam Vitals signs reviewed.  Constitutional:      General: She is not in acute distress.    Appearance: She is obese. She is not ill-appearing, toxic-appearing or diaphoretic.  HENT:     Nose: Nose normal. No congestion.     Mouth/Throat:     Mouth: Mucous membranes are  moist.     Pharynx: No oropharyngeal exudate.  Eyes:     General: No scleral icterus.    Conjunctiva/sclera: Conjunctivae normal.  Neck:     Musculoskeletal: Normal range of motion and neck supple. No neck rigidity.  Cardiovascular:     Rate and Rhythm: Normal rate and regular rhythm.     Heart sounds: Murmur present. Systolic murmur present with a grade of 1/6. No diastolic murmur. No gallop.   Pulmonary:     Effort: Pulmonary effort is normal. No respiratory distress.     Breath sounds: No stridor. No wheezing, rhonchi or rales.  Abdominal:     General: Abdomen is  protuberant.     Palpations: Abdomen is soft. There is no hepatomegaly or splenomegaly.     Tenderness: There is no abdominal tenderness. There is no right CVA tenderness or left CVA tenderness.  Musculoskeletal: Normal range of motion.     Right lower leg: No edema.     Left lower leg: No edema.  Lymphadenopathy:     Cervical: No cervical adenopathy.  Skin:    General: Skin is warm and dry.  Neurological:     General: No focal deficit present.     Mental Status: She is alert.  Psychiatric:        Mood and Affect: Mood normal.        Behavior: Behavior normal.     Lab Results  Component Value Date   WBC 12.3 (H) 02/01/2019   HGB 12.6 02/01/2019   HCT 42.3 02/01/2019   PLT 157 02/01/2019   GLUCOSE 106 (H) 02/01/2019   CHOL 194 08/26/2018   TRIG 95 08/26/2018   HDL 69 08/26/2018   LDLDIRECT 121.0 06/12/2017   LDLCALC 106 (H) 08/26/2018   ALT 16 08/25/2018   AST 34 08/25/2018   NA 139 02/01/2019   K 3.5 02/01/2019   CL 96 (L) 02/01/2019   CREATININE 0.72 02/01/2019   BUN 24 (H) 02/01/2019   CO2 35 (H) 02/01/2019   TSH 2.350 08/25/2018   INR 1.03 08/25/2018   HGBA1C 5.0 08/26/2018    Dg Chest 2 View  Result Date: 02/09/2019 CLINICAL DATA:  Cough and weakness EXAM: CHEST - 2 VIEW COMPARISON:  01/29/2019 FINDINGS: Cardiac shadow remains mildly enlarged. Aortic calcifications are again seen. No significant vascular congestion is noted. Previously seen right basilar changes have improved in the interval. No sizable effusion is noted. No bony abnormality is seen. IMPRESSION: Resolution of previously seen right basilar infiltrate. Electronically Signed   By: Inez Catalina M.D.   On: 02/09/2019 19:39    Assessment & Plan:   Nailah was seen today for hypertension and urinary tract infection.  Diagnoses and all orders for this visit:  Dysuria- Her dipstick UA is positive for red blood cells and white cells.  Her urine culture is pending. -     POCT Urinalysis Dipstick  (Automated) -     CULTURE, URINE COMPREHENSIVE; Future  Acute cystitis with hematuria- I will empirically treat the UTI with nitrofurantoin.  I recommended that she take promethazine as needed in the event that the antibiotic causes nausea or vomiting. -     nitrofurantoin, macrocrystal-monohydrate, (MACROBID) 100 MG capsule; Take 1 capsule (100 mg total) by mouth 2 (two) times daily for 7 days. -     promethazine (PHENERGAN) 12.5 MG tablet; Take 1 tablet (12.5 mg total) by mouth every 6 (six) hours as needed for nausea or vomiting.  Essential hypertension- Her blood  pressure is not adequately well controlled on the current regimen and she is symptomatic with a headache.  I have asked her to add hydralazine to her current regimen. -     hydrALAZINE (APRESOLINE) 50 MG tablet; Take 1 tablet (50 mg total) by mouth 4 (four) times daily.   I am having Matteson H. Belkin start on hydrALAZINE, nitrofurantoin (macrocrystal-monohydrate), and promethazine. I am also having her maintain her Cholecalciferol, acetaminophen, omeprazole, clonazePAM, gabapentin, carvedilol, traZODone, albuterol, torsemide, and mirabegron ER.  Meds ordered this encounter  Medications  . hydrALAZINE (APRESOLINE) 50 MG tablet    Sig: Take 1 tablet (50 mg total) by mouth 4 (four) times daily.    Dispense:  120 tablet    Refill:  5  . nitrofurantoin, macrocrystal-monohydrate, (MACROBID) 100 MG capsule    Sig: Take 1 capsule (100 mg total) by mouth 2 (two) times daily for 7 days.    Dispense:  14 capsule    Refill:  0  . promethazine (PHENERGAN) 12.5 MG tablet    Sig: Take 1 tablet (12.5 mg total) by mouth every 6 (six) hours as needed for nausea or vomiting.    Dispense:  30 tablet    Refill:  0     Follow-up: Return in about 4 weeks (around 05/13/2019).  Scarlette Calico, MD

## 2019-04-15 NOTE — Telephone Encounter (Signed)
Pt is coming into the office today to be seen.

## 2019-04-15 NOTE — Patient Instructions (Signed)

## 2019-04-15 NOTE — Telephone Encounter (Signed)
Yes. I will see her.  TJ 

## 2019-04-15 NOTE — Telephone Encounter (Signed)
Patient not sure who is bringing her, but should be grand daughter, patient is calling grand daughter right now to find out----grand daughter needs to be COVID screened along with the patient---she will remain with patient during the visit today, ok for patient or grand daughter to be transferred to [place] office and talk with tamara,RN

## 2019-04-15 NOTE — Telephone Encounter (Signed)
Patient needs to be seen for pain with urination and a frequency in urination.  Patient states she does have shortness of breath, nausea, headaches off and on, no energy and diarrhea last week.   Please follow up in regard with scheduling patient.

## 2019-04-15 NOTE — Telephone Encounter (Signed)
Please review pt symptoms below. Are you comfortable seeing her in office?

## 2019-04-16 ENCOUNTER — Other Ambulatory Visit: Payer: Medicare HMO

## 2019-04-16 DIAGNOSIS — R300 Dysuria: Secondary | ICD-10-CM

## 2019-04-18 LAB — CULTURE, URINE COMPREHENSIVE
MICRO NUMBER:: 500188
SPECIMEN QUALITY:: ADEQUATE

## 2019-04-22 DIAGNOSIS — H04123 Dry eye syndrome of bilateral lacrimal glands: Secondary | ICD-10-CM | POA: Diagnosis not present

## 2019-04-22 DIAGNOSIS — R7309 Other abnormal glucose: Secondary | ICD-10-CM | POA: Diagnosis not present

## 2019-04-22 DIAGNOSIS — H40013 Open angle with borderline findings, low risk, bilateral: Secondary | ICD-10-CM | POA: Diagnosis not present

## 2019-04-22 DIAGNOSIS — Z961 Presence of intraocular lens: Secondary | ICD-10-CM | POA: Diagnosis not present

## 2019-04-22 LAB — HM DIABETES EYE EXAM

## 2019-04-30 ENCOUNTER — Other Ambulatory Visit: Payer: Self-pay | Admitting: Internal Medicine

## 2019-04-30 DIAGNOSIS — F411 Generalized anxiety disorder: Secondary | ICD-10-CM

## 2019-04-30 MED ORDER — CLONAZEPAM 1 MG PO TABS: 1.0000 mg | ORAL_TABLET | Freq: Two times a day (BID) | ORAL | 3 refills | Status: DC | PRN

## 2019-05-05 DIAGNOSIS — J189 Pneumonia, unspecified organism: Secondary | ICD-10-CM | POA: Diagnosis not present

## 2019-06-04 DIAGNOSIS — J189 Pneumonia, unspecified organism: Secondary | ICD-10-CM | POA: Diagnosis not present

## 2019-06-17 DIAGNOSIS — H04123 Dry eye syndrome of bilateral lacrimal glands: Secondary | ICD-10-CM | POA: Diagnosis not present

## 2019-06-17 DIAGNOSIS — H524 Presbyopia: Secondary | ICD-10-CM | POA: Diagnosis not present

## 2019-06-28 ENCOUNTER — Other Ambulatory Visit: Payer: Self-pay | Admitting: Internal Medicine

## 2019-06-28 DIAGNOSIS — K219 Gastro-esophageal reflux disease without esophagitis: Secondary | ICD-10-CM

## 2019-06-28 MED ORDER — OMEPRAZOLE 40 MG PO CPDR: 40.0000 mg | DELAYED_RELEASE_CAPSULE | Freq: Every day | ORAL | 1 refills | Status: DC

## 2019-07-05 ENCOUNTER — Other Ambulatory Visit: Payer: Self-pay | Admitting: Internal Medicine

## 2019-07-05 DIAGNOSIS — J4520 Mild intermittent asthma, uncomplicated: Secondary | ICD-10-CM

## 2019-07-05 DIAGNOSIS — J189 Pneumonia, unspecified organism: Secondary | ICD-10-CM | POA: Diagnosis not present

## 2019-07-05 MED ORDER — ALBUTEROL SULFATE (2.5 MG/3ML) 0.083% IN NEBU: 2.5000 mg | INHALATION_SOLUTION | Freq: Two times a day (BID) | RESPIRATORY_TRACT | 2 refills | Status: DC | PRN

## 2019-07-13 ENCOUNTER — Encounter: Payer: Self-pay | Admitting: Internal Medicine

## 2019-07-13 ENCOUNTER — Ambulatory Visit (INDEPENDENT_AMBULATORY_CARE_PROVIDER_SITE_OTHER): Payer: Medicare HMO | Admitting: Internal Medicine

## 2019-07-13 ENCOUNTER — Ambulatory Visit: Payer: Self-pay

## 2019-07-13 ENCOUNTER — Other Ambulatory Visit: Payer: Medicare HMO

## 2019-07-13 ENCOUNTER — Ambulatory Visit (INDEPENDENT_AMBULATORY_CARE_PROVIDER_SITE_OTHER)
Admission: RE | Admit: 2019-07-13 | Discharge: 2019-07-13 | Disposition: A | Discharge status: Home / Self Care | Payer: Medicare HMO | Source: Ambulatory Visit | Attending: Internal Medicine | Admitting: Internal Medicine

## 2019-07-13 ENCOUNTER — Other Ambulatory Visit: Payer: Self-pay

## 2019-07-13 VITALS — BP 150/60 | HR 52 | Temp 97.5°F | Resp 20 | Ht 66.0 in | Wt 232.0 lb

## 2019-07-13 DIAGNOSIS — R569 Unspecified convulsions: Secondary | ICD-10-CM | POA: Diagnosis not present

## 2019-07-13 DIAGNOSIS — N3281 Overactive bladder: Secondary | ICD-10-CM

## 2019-07-13 DIAGNOSIS — R0602 Shortness of breath: Secondary | ICD-10-CM

## 2019-07-13 DIAGNOSIS — R079 Chest pain, unspecified: Secondary | ICD-10-CM | POA: Diagnosis not present

## 2019-07-13 DIAGNOSIS — I350 Nonrheumatic aortic (valve) stenosis: Secondary | ICD-10-CM | POA: Diagnosis not present

## 2019-07-13 DIAGNOSIS — E063 Autoimmune thyroiditis: Secondary | ICD-10-CM

## 2019-07-13 DIAGNOSIS — R05 Cough: Secondary | ICD-10-CM

## 2019-07-13 DIAGNOSIS — I5032 Chronic diastolic (congestive) heart failure: Secondary | ICD-10-CM | POA: Diagnosis not present

## 2019-07-13 DIAGNOSIS — R059 Cough, unspecified: Secondary | ICD-10-CM

## 2019-07-13 DIAGNOSIS — I10 Essential (primary) hypertension: Secondary | ICD-10-CM

## 2019-07-13 DIAGNOSIS — F411 Generalized anxiety disorder: Secondary | ICD-10-CM

## 2019-07-13 LAB — CBC WITH DIFFERENTIAL/PLATELET
Basophils Absolute: 0.1 10*3/uL (ref 0.0–0.1)
Basophils Relative: 0.5 % (ref 0.0–3.0)
Eosinophils Absolute: 0.3 10*3/uL (ref 0.0–0.7)
Eosinophils Relative: 2.9 % (ref 0.0–5.0)
HCT: 41.2 % (ref 36.0–46.0)
Hemoglobin: 13 g/dL (ref 12.0–15.0)
Lymphocytes Relative: 20.5 % (ref 12.0–46.0)
Lymphs Abs: 1.9 10*3/uL (ref 0.7–4.0)
MCHC: 31.5 g/dL (ref 30.0–36.0)
MCV: 90.5 fl (ref 78.0–100.0)
Monocytes Absolute: 0.6 10*3/uL (ref 0.1–1.0)
Monocytes Relative: 6.3 % (ref 3.0–12.0)
Neutro Abs: 6.6 10*3/uL (ref 1.4–7.7)
Neutrophils Relative %: 69.8 % (ref 43.0–77.0)
Platelets: 146 10*3/uL — ABNORMAL LOW (ref 150.0–400.0)
RBC: 4.55 Mil/uL (ref 3.87–5.11)
RDW: 14.7 % (ref 11.5–15.5)
WBC: 9.5 10*3/uL (ref 4.0–10.5)

## 2019-07-13 LAB — BASIC METABOLIC PANEL
BUN: 11 mg/dL (ref 6–23)
CO2: 36 mEq/L — ABNORMAL HIGH (ref 19–32)
Calcium: 9.6 mg/dL (ref 8.4–10.5)
Chloride: 100 mEq/L (ref 96–112)
Creatinine, Ser: 0.65 mg/dL (ref 0.40–1.20)
GFR: 104.97 mL/min (ref >60.00)
Glucose, Bld: 95 mg/dL (ref 70–99)
Potassium: 4.6 mEq/L (ref 3.5–5.1)
Sodium: 142 mEq/L (ref 135–145)

## 2019-07-13 LAB — TROPONIN I (HIGH SENSITIVITY): High Sens Troponin I: 8 ng/L (ref 2–17)

## 2019-07-13 LAB — TSH: TSH: 5.85 u[IU]/mL — ABNORMAL HIGH (ref 0.35–4.50)

## 2019-07-13 LAB — BRAIN NATRIURETIC PEPTIDE: Pro B Natriuretic peptide (BNP): 205 pg/mL — ABNORMAL HIGH (ref 0.0–100.0)

## 2019-07-13 MED ORDER — GABAPENTIN 100 MG PO CAPS: 100.0000 mg | ORAL_CAPSULE | Freq: Two times a day (BID) | ORAL | 0 refills | Status: DC

## 2019-07-13 MED ORDER — TORSEMIDE 20 MG PO TABS: 20.0000 mg | ORAL_TABLET | Freq: Every day | ORAL | 0 refills | Status: DC

## 2019-07-13 MED ORDER — SOLIFENACIN SUCCINATE 5 MG PO TABS: 5.0000 mg | ORAL_TABLET | Freq: Every day | ORAL | 0 refills | Status: DC

## 2019-07-13 NOTE — Progress Notes (Signed)
Subjective:  Patient ID: [name], female    DOB: [birth year]  Age: 85 y.o. MRN: [id number]  CC: Cough and Hypertension   HPI PHYLISHA DIX presents for f/up - She complains of a several week history of nonproductive cough, shortness of breath, wheezing, and weight gain.  Based on prescription refills it looks like she is no longer taking the loop diuretic.  Her daughter is with her today and does not add any additional information about symptoms or compliance.  She continues to complain of urinary frequency and nocturia.  She wants to know if something can be added to Myrbetriq.  She also tells me that she wants to start lowering the dose of gabapentin.  Outpatient Medications Prior to Visit  Medication Sig Dispense Refill  . acetaminophen (TYLENOL) 325 MG tablet Take 2 tablets (650 mg total) by mouth every 6 (six) hours as needed for moderate pain.    Marland Kitchen albuterol (PROVENTIL) (2.5 MG/3ML) 0.083% nebulizer solution Take 3 mLs (2.5 mg total) by nebulization 2 (two) times daily as needed for wheezing or shortness of breath. 75 mL 2  . carvedilol (COREG) 6.25 MG tablet Take 1 tablet (6.25 mg total) by mouth 2 (two) times daily with a meal. 180 tablet 1  . Cholecalciferol 2000 units TABS Take 2 tablets (4,000 Units total) by mouth daily. 180 tablet 1  . clonazePAM (KLONOPIN) 1 MG tablet Take 1 tablet (1 mg total) by mouth 2 (two) times daily as needed. for anxiety 60 tablet 3  . hydrALAZINE (APRESOLINE) 50 MG tablet Take 1 tablet (50 mg total) by mouth 4 (four) times daily. 120 tablet 5  . mirabegron ER (MYRBETRIQ) 50 MG TB24 tablet Take 1 tablet (50 mg total) by mouth daily. 90 tablet 1  . omeprazole (PRILOSEC) 40 MG capsule Take 1 capsule (40 mg total) by mouth daily. 90 capsule 1  . promethazine (PHENERGAN) 12.5 MG tablet Take 1 tablet (12.5 mg total) by mouth every 6 (six) hours as needed for nausea or vomiting. 30 tablet 0  . traZODone (DESYREL) 100 MG tablet Take 1 tablet (100 mg total) by  mouth at bedtime. 90 tablet 1  . gabapentin (NEURONTIN) 100 MG capsule Take 1 capsule (100 mg total) by mouth 3 (three) times daily. 90 capsule 3  . torsemide (DEMADEX) 10 MG tablet Take 1 tablet (10 mg total) by mouth daily. 90 tablet 0   No facility-administered medications prior to visit.     ROS Review of Systems  Constitutional: Positive for unexpected weight change (wt gain). Negative for diaphoresis and fatigue.  HENT: Negative.   Eyes: Negative for visual disturbance.  Respiratory: Positive for cough, shortness of breath and wheezing. Negative for stridor.   Cardiovascular: Negative for chest pain, palpitations and leg swelling.  Gastrointestinal: Negative for abdominal pain, diarrhea, nausea and vomiting.  Endocrine: Positive for polyuria. Negative for polydipsia and polyphagia.  Genitourinary: Positive for frequency and urgency. Negative for difficulty urinating, dysuria and hematuria.  Musculoskeletal: Negative.   Skin: Negative.   Neurological: Negative.  Negative for dizziness, weakness and light-headedness.  Hematological: Negative for adenopathy. Does not bruise/bleed easily.  Psychiatric/Behavioral: Negative.     Objective:  BP (!) 150/60 (BP Location: Left Arm, Patient Position: Sitting, Cuff Size: Normal)   Pulse (!) 52   Temp (!) 97.5 F (36.4 C) (Oral)   Resp 20   Ht 5\' 6"  (1.676 m)   Wt 232 lb (105.2 kg)   SpO2 97%   BMI 37.45  kg/m   BP Readings from Last 3 Encounters:  07/13/19 (!) 150/60  04/15/19 (!) 164/80  02/09/19 (!) 154/80    Wt Readings from Last 3 Encounters:  07/13/19 232 lb (105.2 kg)  04/15/19 232 lb (105.2 kg)  02/09/19 221 lb (100.2 kg)    Physical Exam Vitals signs reviewed.  Constitutional:      General: She is not in acute distress.    Appearance: She is obese. She is not ill-appearing, toxic-appearing or diaphoretic.  HENT:     Nose: Nose normal.     Mouth/Throat:     Mouth: Mucous membranes are moist.  Eyes:      General: No scleral icterus.    Conjunctiva/sclera: Conjunctivae normal.  Neck:     Musculoskeletal: Normal range of motion and neck supple. No neck rigidity or muscular tenderness.  Cardiovascular:     Rate and Rhythm: Normal rate and regular rhythm.     Heart sounds: Murmur present. Systolic murmur present with a grade of 2/6. No diastolic murmur. No gallop.      Comments: EKG ---  Sinus  Rhythm  -Old anteroseptal infarct.   -  Nonspecific T-abnormality.   ABNORMAL - no change from the prior EKG  Pulmonary:     Effort: Pulmonary effort is normal. No tachypnea, accessory muscle usage or respiratory distress.     Breath sounds: Examination of the right-lower field reveals rales. Examination of the left-lower field reveals rales. Rales present. No decreased breath sounds, wheezing or rhonchi.  Abdominal:     General: Abdomen is protuberant. Bowel sounds are normal. There is no distension.     Palpations: There is no hepatomegaly or splenomegaly.     Tenderness: There is no abdominal tenderness.  Musculoskeletal:     Right lower leg: No edema.     Left lower leg: No edema.  Lymphadenopathy:     Cervical: No cervical adenopathy.  Neurological:     General: No focal deficit present.     Mental Status: She is alert.  Psychiatric:        Mood and Affect: Mood normal.        Behavior: Behavior normal.     Lab Results  Component Value Date   WBC 9.5 07/13/2019   HGB 13.0 07/13/2019   HCT 41.2 07/13/2019   PLT 146.0 (L) 07/13/2019   GLUCOSE 95 07/13/2019   CHOL 194 08/26/2018   TRIG 95 08/26/2018   HDL 69 08/26/2018   LDLDIRECT 121.0 06/12/2017   LDLCALC 106 (H) 08/26/2018   ALT 16 08/25/2018   AST 34 08/25/2018   NA 142 07/13/2019   K 4.6 07/13/2019   CL 100 07/13/2019   CREATININE 0.65 07/13/2019   BUN 11 07/13/2019   CO2 36 (H) 07/13/2019   TSH 5.85 (H) 07/13/2019   INR 1.03 08/25/2018   HGBA1C 5.0 08/26/2018    Dg Chest 2 View  Result Date: 02/09/2019  CLINICAL DATA:  Cough and weakness EXAM: CHEST - 2 VIEW COMPARISON:  01/29/2019 FINDINGS: Cardiac shadow remains mildly enlarged. Aortic calcifications are again seen. No significant vascular congestion is noted. Previously seen right basilar changes have improved in the interval. No sizable effusion is noted. No bony abnormality is seen. IMPRESSION: Resolution of previously seen right basilar infiltrate. Electronically Signed   By: Inez Catalina M.D.   On: 02/09/2019 19:39    No results found.   Assessment & Plan:   Zennie was seen today for cough and hypertension.  Diagnoses and  all orders for this visit:  Cough- Her chest x-ray is consistent with pulmonary edema. -     Cancel: DG Chest 2 View; Future -     DG Chest 2 View; Future  Aortic stenosis, mild -     Ambulatory referral to Cardiology  Shortness of breath -     EKG 12-Lead -     CBC with Differential/Platelet; Future -     Brain natriuretic peptide; Future -     D-dimer, quantitative (not at [name]); Future -     Troponin I (High Sensitivity); Future -     DG Chest 2 View; Future  Essential hypertension- Her blood pressure is not adequately well controlled.  I have asked her to restart the loop diuretic. -     CBC with Differential/Platelet; Future -     Basic metabolic panel; Future -     torsemide (DEMADEX) 20 MG tablet; Take 1 tablet (20 mg total) by mouth daily.  Chronic diastolic CHF (congestive heart failure) ([facility])- Based on her symptoms, exam, chest x-ray, and labs it looks like she is experiencing mild fluid overload.  I have asked her to restart the loop diuretic. -     Brain natriuretic peptide; Future -     Troponin I (High Sensitivity); Future -     torsemide (DEMADEX) 20 MG tablet; Take 1 tablet (20 mg total) by mouth daily. -     Ambulatory referral to Cardiology  Hashimoto's thyroiditis- Her TSH is at 5.85.  Considering her age and comorbid illnesses this is an acceptable level.  She does not need to be  treated for thyroid disease. -     TSH; Future  Seizure (HCC) -     gabapentin (NEURONTIN) 100 MG capsule; Take 1 capsule (100 mg total) by mouth 2 (two) times daily.  GAD (generalized anxiety disorder) -     gabapentin (NEURONTIN) 100 MG capsule; Take 1 capsule (100 mg total) by mouth 2 (two) times daily.  OAB (overactive bladder) -     solifenacin (VESICARE) 5 MG tablet; Take 1 tablet (5 mg total) by mouth daily.   I have discontinued Tasfia H. Duncanson's torsemide. I have also changed her gabapentin. Additionally, I am having her start on torsemide and solifenacin. Lastly, I am having her maintain her Cholecalciferol, acetaminophen, carvedilol, traZODone, mirabegron ER, hydrALAZINE, promethazine, clonazePAM, omeprazole, and albuterol.  Meds ordered this encounter  Medications  . torsemide (DEMADEX) 20 MG tablet    Sig: Take 1 tablet (20 mg total) by mouth daily.    Dispense:  90 tablet    Refill:  0  . gabapentin (NEURONTIN) 100 MG capsule    Sig: Take 1 capsule (100 mg total) by mouth 2 (two) times daily.    Dispense:  180 capsule    Refill:  0  . solifenacin (VESICARE) 5 MG tablet    Sig: Take 1 tablet (5 mg total) by mouth daily.    Dispense:  90 tablet    Refill:  0     Follow-up: Return in 6 weeks (on 08/24/2019), or if symptoms worsen or fail to improve.  Scarlette Calico, MD

## 2019-07-13 NOTE — Patient Instructions (Addendum)
Heart Failure, Diagnosis  Heart failure is a condition in which the heart has trouble pumping blood because it has become weak or stiff. This means that the heart does not pump blood well enough for the body to stay healthy. For some people with heart failure, fluid may back up into the lungs. There may also be swelling (edema) in the lower legs. Heart failure is usually a long-term (chronic) condition. It is important for you to take good care of yourself and follow the treatment plan from your health care provider. What are the causes? This condition may be caused by:  High blood pressure (hypertension). Hypertension causes the heart muscle to work harder than normal. This makes the heart stiff or weak.  Coronary artery disease, or CAD. CAD is the buildup of cholesterol and fat (plaque) in the arteries of the heart.  Heart attack, also called myocardial infarction. This injures the heart muscle, making it hard for the heart to pump blood.  Abnormal heart valves. The valves do not open and close properly, forcing the heart to pump harder to keep the blood flowing.  Heart muscle disease (cardiomyopathy or myocarditis). This is damage to the heart muscle. It can increase the risk of heart failure.  Lung disease. The heart works harder when the lungs are not healthy.  Abnormal heart rhythms. These can lead to heart failure. What increases the risk? The risk of heart failure increases as a person ages. This condition is also more likely to develop in people who:  Are overweight.  Are female.  Smoke or chew tobacco.  Abuse alcohol or illegal drugs.  Have taken medicines that can damage the heart, such as chemotherapy drugs.  Have diabetes.  Have abnormal heart rhythms.  Have thyroid problems.  Have low blood counts (anemia). What are the signs or symptoms? Symptoms of this condition include:  Shortness of breath with activity, such as when climbing stairs.  A cough that does not  go away.  Swelling of the feet, ankles, legs, or abdomen.  Losing weight for no reason.  Trouble breathing when lying flat (orthopnea).  Waking from sleep because of the need to sit up and get more air.  Rapid heartbeat.  Tiredness (fatigue) and loss of energy.  Feeling light-headed, dizzy, or close to fainting.  Loss of appetite.  Nausea.  Waking up more often during the night to urinate (nocturia).  Confusion. How is this diagnosed? This condition is diagnosed based on:  Your medical history, symptoms, and a physical exam.  Diagnostic tests, which may include: ? Echocardiogram. ? Electrocardiogram (ECG). ? Chest X-ray. ? Blood tests. ? Exercise stress test. ? Radionuclide scans. ? Cardiac catheterization and angiogram. How is this treated? Treatment for this condition is aimed at managing the symptoms of heart failure. Medicines Treatment may include medicines that:  Help lower blood pressure by relaxing (dilating) the blood vessels. These medicines are called ACE inhibitors (angiotensin-converting enzyme) and ARBs (angiotensin receptor blockers).  Cause the kidneys to remove salt and water from the blood through urination (diuretics).  Improve heart muscle strength and prevent the heart from beating too fast (beta blockers).  Increase the force of the heartbeat (digoxin). Healthy behavior changes     Treatment may also include making healthy lifestyle changes, such as:  Reaching and staying at a healthy weight.  Quitting smoking or chewing tobacco.  Eating heart-healthy foods.  Limiting or avoiding alcohol.  Stopping the use of illegal drugs.  Being physically active.  Other treatments   Other treatments may include:  Procedures to open blocked arteries or repair damaged valves.  Placing a pacemaker to improve heart function (cardiac resynchronization therapy).  Placing a device to treat serious abnormal heart rhythms (implantable cardioverter  defibrillator, or ICD).  Placing a device to improve the pumping ability of the heart (left ventricular assist device, or LVAD).  Receiving a healthy heart from a donor (heart transplant). This is done when other treatments have not helped. Follow these instructions at home:  Manage other health conditions as told by your health care provider. These may include hypertension, diabetes, thyroid disease, or abnormal heart rhythms.  Get ongoing education and support as needed. Learn as much as you can about heart failure.  Keep all follow-up visits as told by your health care provider. This is important. Summary  Heart failure is a condition in which the heart has trouble pumping blood because it has become weak or stiff.  This condition is caused by high blood pressure and other diseases of the heart and lungs.  Symptoms of this condition include shortness of breath, tiredness (fatigue), nausea, and swelling of the feet, ankles, legs, or abdomen.  Treatments for this condition may include medicines, lifestyle changes, and surgery.  Manage other health conditions as told by your health care provider. This information is not intended to replace advice given to you by your health care provider. Make sure you discuss any questions you have with your health care provider. Document Released: 11/11/2005 Document Revised: 01/29/2019 Document Reviewed: 01/29/2019 Elsevier Patient Education  2020 [employer]  

## 2019-07-13 NOTE — Telephone Encounter (Signed)
Incoming call  from Patient with complain Off and all headaches.  Patient also state that her nebulizer dosent seemto be work [employer] libr[employer]. Would like a  New Rx. For a nebulizer.  States appetite has decrease. Transferred to office to make appointment.   Reason for Disposition . [1] MILD difficulty breathing (e.g., minimal/no SOB at rest, SOB with walking, pulse <100) AND [2] NEW-onset or WORSE than normal  Answer Assessment - Initial Assessment Questions 1. RESPIRATORY STATUS: "Describe your breathing?" (e.g., wheezing, shortness of breath, unable to speak, severe coughing)    Wheezing 2. ONSET: "When did this breathing problem begin?"      3days ago 3. PATTERN "Does the difficult breathing come and go, or has it been constant since it started?"    constant 4. SEVERITY: "How bad is your breathing?" (e.g., mild, moderate, severe)    - MILD: No SOB at rest, mild SOB with walking, speaks normally in sentences, can lay down, no retractions, pulse < 100.    - MODERATE: SOB at rest, SOB with minimal exertion and prefers to sit, cannot lie down flat, speaks in phrases, mild retractions, audible wheezing, pulse 100-120.    - SEVERE: Very SOB at rest, speaks in single words, struggling to breathe, sitting hunched forward, retractions, pulse > 120      severe 5. RECURRENT SYMPTOM: "Have you had difficulty breathing before?" If so, ask: "When was the last time?" and "What happened that time?"      *No Answer* 6. CARDIAC HISTORY: "Do you have any history of heart disease?" (e.g., heart attack, angina, bypass surgery, angioplasty)      double pneumonia 7. LUNG HISTORY: "Do you have any history of lung disease?"  (e.g., pulmonary embolus, asthma, emphysema)     denies 8. CAUSE: "What do you think is causing the breathing problem?"      *No Answer* 9. OTHER SYMPTOMS: "Do you have any other symptoms? (e.g., dizziness, runny nose, cough, chest pain, fever)    Denies, coughs every now and then  10. PREGNANCY: "Is  there any chance you are pregnant?" "When was your last menstrual period?"      na 11. TRAVEL: "Have you traveled out of the country in the last month?" (e.g., travel history, exposures)       denies  Protocols used: BREATHING DIFFICULTY-A-AH

## 2019-07-14 LAB — D-DIMER, QUANTITATIVE: D-Dimer, Quant: 0.56 mcg/mL FEU — ABNORMAL HIGH (ref <0.50)

## 2019-07-15 ENCOUNTER — Telehealth: Payer: Self-pay | Admitting: Cardiology

## 2019-07-15 NOTE — Telephone Encounter (Signed)
Left message for patient of dr christopher's response.

## 2019-07-15 NOTE — Telephone Encounter (Signed)
Ok by me. Family support will need to wear mask and pass screening protocol.

## 2019-07-15 NOTE — Telephone Encounter (Signed)
New Message:      Daughter called. Pt will be a new pt for Dr Harrell Gave tomorrow(07-16-19), Pt will neeed a family member to come in with her for her appt. Pt has will need assistant getting around in the office and she have problem remembering.

## 2019-07-16 ENCOUNTER — Other Ambulatory Visit: Payer: Self-pay

## 2019-07-16 ENCOUNTER — Encounter: Payer: Self-pay | Admitting: Cardiology

## 2019-07-16 ENCOUNTER — Ambulatory Visit: Payer: Medicare HMO | Admitting: Cardiology

## 2019-07-16 VITALS — BP 156/81 | HR 78 | Ht 65.0 in | Wt 231.2 lb

## 2019-07-16 DIAGNOSIS — R0602 Shortness of breath: Secondary | ICD-10-CM

## 2019-07-16 DIAGNOSIS — R011 Cardiac murmur, unspecified: Secondary | ICD-10-CM

## 2019-07-16 DIAGNOSIS — I5032 Chronic diastolic (congestive) heart failure: Secondary | ICD-10-CM

## 2019-07-16 DIAGNOSIS — I10 Essential (primary) hypertension: Secondary | ICD-10-CM | POA: Diagnosis not present

## 2019-07-16 DIAGNOSIS — Z79899 Other long term (current) drug therapy: Secondary | ICD-10-CM | POA: Diagnosis not present

## 2019-07-16 DIAGNOSIS — Z7189 Other specified counseling: Secondary | ICD-10-CM | POA: Diagnosis not present

## 2019-07-16 MED ORDER — SPIRONOLACTONE 25 MG PO TABS: 25.0000 mg | ORAL_TABLET | Freq: Every day | ORAL | 3 refills | Status: DC

## 2019-07-16 MED ORDER — CARVEDILOL 12.5 MG PO TABS: 12.5000 mg | ORAL_TABLET | Freq: Two times a day (BID) | ORAL | 3 refills | Status: DC

## 2019-07-16 NOTE — Progress Notes (Signed)
Cardiology Office Note:    Date:  07/16/2019   ID:  [name], DOB 05-20-1933, MRN [id number]  PCP:  [name], MD  Cardiologist:  [name], MD PhD  Referring MD: [name], MD   CC: new consult  History of Present Illness:    [name] is a 85 y.o. female with a hx of hypertension who is seen as a new consult at the request of [name], MD for the evaluation and management of aortic stenosis and concern for diastolic heart failure.  Admitted 01/2019 with hypoxia attributed to pneumonia. No echo in our system since 2014.  Patient concerns today: Here with family, brings a list of questions and concerns.  -was started on torsemide, wonders about the interaction between diuretic and myrbetric -concerns: SOB with short distances, climbing stairs. Can't lie flat, uses 3 pillows to sleep (not chronic). No energy. Lightheaded intermittently. Had LE edema but improved on torsemide. Has fast heartbeat with activity. -what causes fluid on the heart? Many questions related to this, discussed at length -can she exercise, walk etc?  Reports several weeks of shortness of breath, weight gain, dry cough. Endorses chest heaviness at night, better with breathing tx. Her main concern is how frequently she has to wake at night to urinate--is on myrbetriq, worried about taking the diuretic with this.  BP at home 156/81 last night, has been like that on average the last few days. Last week was even higher, [id number] systolic. Not weighing herself routinely, but has gained 30 lbs since coronavirus. Working on weaning gabapentin, as she feels like this may be part of her leg swelling.  ROS positive for shortness of breath with minimal exertion, 3 pillow orthopnea, LE edema, weight gain. Chest heaviness but not exertional, improved with breathing treatment. No PND. No syncope or palpitations.   Past Medical History:  Diagnosis Date  . Abdominal pain   . Anemia    NOS  .  Anxiety   . Back pain   . Bruises easily   . Chronic diastolic CHF (congestive heart failure) ([place])    a. 06/2013 EF 65-70%.  . Chronic headaches    HISTORY OF   . Depression   . Diabetes mellitus    type II  DIET CONTROLLED  . Diarrhea   . Hepatitis    AGE 30S  . Hyperlipidemia   . Hypertension   . Mild aortic stenosis    a. 06/2013 Echo: EF 65-70%, mod LVH with focal basal hypertrophy, very mild AS.  Marland Kitchen Osteoarthritis   . Oxygen desaturation during sleep    USES 2 LITERS BEDTIME   VIA CPAP 06/2012 [facility]   . Shortness of breath    WITH EXERTION USES 2 L O2 BEDTIME  . Sleep apnea    CPAP WITH O2 2 LITERS 2013 ([facility])  . Wears dentures   . Wears glasses     Past Surgical History:  Procedure Laterality Date  . ABDOMINAL HYSTERECTOMY    . APPENDECTOMY    . CHOLECYSTECTOMY    . JOINT REPLACEMENT     LEFT KNEE   . KNEE ARTHROPLASTY Right 03/22/2015   Procedure: COMPUTER ASSISTED TOTAL KNEE ARTHROPLASTY;  Surgeon: Marybelle Killings, MD;  Location: [place];  Service: Orthopedics;  Laterality: Right;  . KNEE ARTHROSCOPY     RIGHT  . LEFT AND RIGHT HEART CATHETERIZATION WITH CORONARY ANGIOGRAM N/A 10/28/2013   Procedure: LEFT AND RIGHT HEART CATHETERIZATION WITH CORONARY ANGIOGRAM;  Surgeon: Blane Ohara, MD;  Location: [facility] CATH LAB;  Service: Cardiovascular;  Laterality: N/A;  . LEFT HEART CATHETERIZATION WITH CORONARY ANGIOGRAM N/A 11/14/2014   Procedure: LEFT HEART CATHETERIZATION WITH CORONARY ANGIOGRAM;  Surgeon: Josue Hector, MD;  Location: [facility] CATH LAB;  Service: Cardiovascular;  Laterality: N/A;  . REVISION TOTAL KNEE ARTHROPLASTY  2011  . ROTATOR CUFF REPAIR  2010  . SHOULDER ARTHROSCOPY  09/17/2012   Procedure: ARTHROSCOPY SHOULDER;  Surgeon: Sharmon Revere, MD;  Location: [place];  Service: Orthopedics;  Laterality: Left;  . TONSILLECTOMY    . TONSILLECTOMY      Current Medications: Current Outpatient Medications on File Prior to Visit  Medication Sig  .  acetaminophen (TYLENOL) 325 MG tablet Take 2 tablets (650 mg total) by mouth every 6 (six) hours as needed for moderate pain.  Marland Kitchen albuterol (PROVENTIL) (2.5 MG/3ML) 0.083% nebulizer solution Take 3 mLs (2.5 mg total) by nebulization 2 (two) times daily as needed for wheezing or shortness of breath.  . carvedilol (COREG) 6.25 MG tablet Take 1 tablet (6.25 mg total) by mouth 2 (two) times daily with a meal.  . Cholecalciferol 2000 units TABS Take 2 tablets (4,000 Units total) by mouth daily.  . clonazePAM (KLONOPIN) 1 MG tablet Take 1 tablet (1 mg total) by mouth 2 (two) times daily as needed. for anxiety  . gabapentin (NEURONTIN) 100 MG capsule Take 1 capsule (100 mg total) by mouth 2 (two) times daily.  . hydrALAZINE (APRESOLINE) 50 MG tablet Take 1 tablet (50 mg total) by mouth 4 (four) times daily.  . mirabegron ER (MYRBETRIQ) 50 MG TB24 tablet Take 1 tablet (50 mg total) by mouth daily.  Marland Kitchen omeprazole (PRILOSEC) 40 MG capsule Take 1 capsule (40 mg total) by mouth daily.  . promethazine (PHENERGAN) 12.5 MG tablet Take 1 tablet (12.5 mg total) by mouth every 6 (six) hours as needed for nausea or vomiting.  . solifenacin (VESICARE) 5 MG tablet Take 1 tablet (5 mg total) by mouth daily.  Marland Kitchen torsemide (DEMADEX) 20 MG tablet Take 1 tablet (20 mg total) by mouth daily.  . traZODone (DESYREL) 100 MG tablet Take 1 tablet (100 mg total) by mouth at bedtime.   No current facility-administered medications on file prior to visit.      Allergies:   Amlodipine, Enalapril, Losartan potassium, Morphine and related, and Metformin   Social History   Socioeconomic History  . Marital status: Widowed    Spouse name: Not on file  . Number of children: 5  . Years of education: Not on file  . Highest education level: Not on file  Occupational History  . Occupation: retired     Fish farm manager: RETIRED    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[employer]  . Financial resource strain: Not on file  . Food insecurity     Worry: Not on file    Inability: Not on file  . Transportation needs    Medical: Not on file    Non-medical: Not on file  Tobacco Use  . Smoking status: Never Smoker  . Smokeless tobacco: Never Used  Substance and Sexual Activity  . Alcohol use: No  . Drug use: No  . Sexual activity: Never  Lifestyle  . Physical activity    Days per week: Not on file    Minutes per session: Not on file  . Stress: Not on file  Relationships  . Social connections    Talks on phone: Not on file  Gets together: Not on file    Attends religious service: Not on file    Active member of club or organization: Not on file    Attends meetings of clubs or organizations: Not on file    Relationship status: Not on file  Other Topics Concern  . Not on file  Social History Narrative  . Not on file     Family History: The patient's family history includes Alcohol abuse in an other family member; Cancer in her father; Emphysema in her brother; Heart disease in her brother and sister; Hypertension in an other family member; Kidney disease in an other family member; Mental illness in an other family member.  ROS:   Please see the history of present illness.  Additional pertinent ROS: Constitutional: Negative for chills, fever, night sweats, unintentional weight loss  HENT: Negative for ear pain and hearing loss.   Eyes: Negative for loss of vision and eye pain.  Respiratory: Positive for nonproductive cough, shortness of breath Cardiovascular: See HPI. Gastrointestinal: Negative for abdominal pain, melena, and hematochezia.  Genitourinary: Negative for dysuria and hematuria.  Musculoskeletal: Negative for falls and myalgias.  Skin: Negative for itching and rash.  Neurological: Negative for focal weakness, focal sensory changes and loss of consciousness.  Endo/Heme/Allergies: Does not bruise/bleed easily.     EKGs/Labs/Other Studies Reviewed:    The following studies were reviewed today: Prior PCP  notes and labs  EKG:  EKG is personally reviewed.  The ekg ordered 07/13/19 demonstrates NSR, nonspecific ST changes  Recent Labs: 08/25/2018: ALT 16 01/29/2019: B Natriuretic Peptide 144.2 02/01/2019: Magnesium 1.8 07/13/2019: BUN 11; Creatinine, Ser 0.65; Hemoglobin 13.0; Platelets 146.0; Potassium 4.6; Pro B Natriuretic peptide (BNP) 205.0; Sodium 142; TSH 5.85  Recent Lipid Panel    Component Value Date/Time   CHOL 194 08/26/2018 0116   TRIG 95 08/26/2018 0116   HDL 69 08/26/2018 0116   CHOLHDL 2.8 08/26/2018 0116   VLDL 19 08/26/2018 0116   LDLCALC 106 (H) 08/26/2018 0116   LDLDIRECT 121.0 06/12/2017 1349    Physical Exam:    VS:  BP (!) 156/81   Pulse 78   Ht 5\' 5"  (1.651 m)   Wt 231 lb 3.2 oz (104.9 kg)   SpO2 93%   BMI 38.47 kg/m     Wt Readings from Last 3 Encounters:  07/16/19 231 lb 3.2 oz (104.9 kg)  07/13/19 232 lb (105.2 kg)  04/15/19 232 lb (105.2 kg)    GEN: Well nourished, well developed in no acute distress HEENT: Normal, moist mucous membranes NECK: No JVD CARDIAC: regular rhythm, normal S1 and S2, no rubs, gallops. 2/6 SEM VASCULAR: Radial and DP pulses 2+ bilaterally. No carotid bruits RESPIRATORY:  Clear to auscultation without rales, wheezing or rhonchi  ABDOMEN: Soft, non-tender, non-distended MUSCULOSKELETAL:  Ambulates independently SKIN: Warm and dry, trivial BL LE edema NEUROLOGIC:  Alert and oriented x 3. No focal neuro deficits noted. PSYCHIATRIC:  Normal affect    ASSESSMENT:    1. SOB (shortness of breath)   2. Essential hypertension   3. Medication management   4. Encounter for education about heart failure   5. Chronic diastolic CHF (congestive heart failure) ([place])   6. Murmur, cardiac    PLAN:    Shortness of breath, primarily dyspnea on exertion: concerning for heart failure, unclear if systolic or diastolic but has had chronic diastolic heart failure in the past. Now appears euvolemic with start of torsemide. -discussed urinary  frequency. Ok to use  antispasm agent with diuretic. Counseled her to take torsemide in the morning to minimize nocturia -get echo -suspect this may be acute diastolic HF given her history of chronic diastolic HF. Will start spironolactone to assist with volume status and potassium -given heart failure education, attached to AVS  Murmur: in 2014 had aortic sclerosis without stenosis. With DOE, will recheck echo to make sure this hasn't become stenosis  Hypertension: -increase carvedilol to 12.5 mg BID -start spirinolactone 25 mg daily -recheck BMET 2 weeks -follow up pharmD clinic  Plan for follow up: 3 mos  Medication Adjustments/Labs and Tests Ordered: Current medicines are reviewed at length with the patient today.  Concerns regarding medicines are outlined above.  Orders Placed This Encounter  Procedures  . Basic metabolic panel  . ECHOCARDIOGRAM COMPLETE   Meds ordered this encounter  Medications  . carvedilol (COREG) 12.5 MG tablet    Sig: Take 1 tablet (12.5 mg total) by mouth 2 (two) times daily with a meal.    Dispense:  180 tablet    Refill:  3  . spironolactone (ALDACTONE) 25 MG tablet    Sig: Take 1 tablet (25 mg total) by mouth daily.    Dispense:  90 tablet    Refill:  3    Patient Instructions  Medication Instructions:  Start: Spironolactone 25 mg daily Increase Carvedilol 12.5 mg two times a day   If you need a refill on your cardiac medications before your next appointment, please call your pharmacy.   Lab work: Your physician recommends that you return for lab work in 2 weeks (BMP) If you have labs (blood work) drawn today and your tests are completely normal, you will receive your results only by: Marland Kitchen MyChart Message (if you have MyChart) OR . A paper copy in the mail If you have any lab test that is abnormal or we need to change your treatment, we will call you to review the results.  Testing/Procedures: Your physician has requested that you have an  echocardiogram. Echocardiography is a painless test that uses sound waves to create images of your heart. It provides your doctor with information about the size and shape of your heart and how well your heart's chambers and valves are working. This procedure takes approximately one hour. There are no restrictions for this procedure. [place] [address]   Follow-Up: At [facility], you and your health needs are our priority.  As part of our continuing mission to provide you with exceptional heart care, we have created designated Provider Care Teams.  These Care Teams include your primary Cardiologist (physician) and Advanced Practice Providers (APPs -  Physician Assistants and Nurse Practitioners) who all work together to provide you with the care you need, when you need it. You will need a follow up appointment in 3 months.  Please call our office 2 months in advance to schedule this appointment.  You may see Dr. Harrell Gave or one of the following Advanced Practice Providers on your designated Care Team:   Rosaria Ferries, PA-C . Jory Sims, DNP, ANP  Your physician recommends that you schedule a follow-up appointment in 4-6 weeks with Pharm D.   Do the following things EVERY DAY:  1) Weigh yourself EVERY morning after you go to the bathroom but before you eat or drink anything. Write this number down in a weight log/diary. If you gain 3 pounds overnight or 5 pounds in a week, call the office.  2) Take your medicines as  prescribed. If you have concerns about your medications, please call us before you stop taking them.   3) Eat low salt foods-Limit salt (sodium) to 2000 mg per day. This will help prevent your body from holding onto fluid. Read food labels as many processed foods have a lot of sodium, especially canned goods and prepackaged meats. If you would like some assistance choosing low sodium foods, we would be happy to set you up with a nutritionist.  4) Stay as  active as you can everyday. Staying active will give you more energy and make your muscles stronger. Start with 5 minutes at a time and work your way up to 30 minutes a day. Break up your activities--do some in the morning and some in the afternoon. Start with 3 days per week and work your way up to 5 days as you can.  If you have chest pain, feel short of breath, dizzy, or lightheaded, STOP. If you don't feel better after a short rest, call 911. If you do feel better, call the office to let us know you have symptoms with exercise.  5) Limit all fluids for the day to less than 2 liters. Fluid includes all drinks, coffee, juice, ice chips, soup, jello, and all other liquids.     Signed, [name], MD PhD 07/16/2019 11:24 AM    [place]

## 2019-07-16 NOTE — Patient Instructions (Addendum)
Medication Instructions:  Start: Spironolactone 25 mg daily Increase Carvedilol 12.5 mg two times a day   If you need a refill on your cardiac medications before your next appointment, please call your pharmacy.   Lab work: Your physician recommends that you return for lab work in 2 weeks (BMP) If you have labs (blood work) drawn today and your tests are completely normal, you will receive your results only by: Marland Kitchen MyChart Message (if you have MyChart) OR . A paper copy in the mail If you have any lab test that is abnormal or we need to change your treatment, we will call you to review the results.  Testing/Procedures: Your physician has requested that you have an echocardiogram. Echocardiography is a painless test that uses sound waves to create images of your heart. It provides your doctor with information about the size and shape of your heart and how well your heart's chambers and valves are working. This procedure takes approximately one hour. There are no restrictions for this procedure. [place] [address]   Follow-Up: At [facility], you and your health needs are our priority.  As part of our continuing mission to provide you with exceptional heart care, we have created designated Provider Care Teams.  These Care Teams include your primary Cardiologist (physician) and Advanced Practice Providers (APPs -  Physician Assistants and Nurse Practitioners) who all work together to provide you with the care you need, when you need it. You will need a follow up appointment in 3 months.  Please call our office 2 months in advance to schedule this appointment.  You may see Dr. Harrell Gave or one of the following Advanced Practice Providers on your designated Care Team:   Rosaria Ferries, PA-C . Jory Sims, DNP, ANP  Your physician recommends that you schedule a follow-up appointment in 4-6 weeks with Pharm D.   Do the following things EVERY DAY:  1) Weigh yourself EVERY  morning after you go to the bathroom but before you eat or drink anything. Write this number down in a weight log/diary. If you gain 3 pounds overnight or 5 pounds in a week, call the office.  2) Take your medicines as prescribed. If you have concerns about your medications, please call us before you stop taking them.   3) Eat low salt foods-Limit salt (sodium) to 2000 mg per day. This will help prevent your body from holding onto fluid. Read food labels as many processed foods have a lot of sodium, especially canned goods and prepackaged meats. If you would like some assistance choosing low sodium foods, we would be happy to set you up with a nutritionist.  4) Stay as active as you can everyday. Staying active will give you more energy and make your muscles stronger. Start with 5 minutes at a time and work your way up to 30 minutes a day. Break up your activities--do some in the morning and some in the afternoon. Start with 3 days per week and work your way up to 5 days as you can.  If you have chest pain, feel short of breath, dizzy, or lightheaded, STOP. If you don't feel better after a short rest, call 911. If you do feel better, call the office to let us know you have symptoms with exercise.  5) Limit all fluids for the day to less than 2 liters. Fluid includes all drinks, coffee, juice, ice chips, soup, jello, and all other liquids.

## 2019-07-19 ENCOUNTER — Encounter: Payer: Self-pay | Admitting: Cardiology

## 2019-07-20 ENCOUNTER — Telehealth: Payer: Self-pay | Admitting: Cardiology

## 2019-07-20 NOTE — Telephone Encounter (Signed)
The patient called in stating that she has been dizzy today. Blood pressure this morning was 119/50 with a heart rate of 67 (this was an hour after her medications). She stated that her shortness of breath has gotten better but now she feels dizzy.  She feels like she may be getting dehydrated. She stated that she has only been drinking sips of water with her medications. She has been advised that she has a 2 liter limit so she needs to drink more than her sips. She is unable to weigh herself at home.   She currently takes spironolactone 25 mg once daily Torsemide 20 mg once daily Hydralazine 50 mg four times daily Carvedilol 12.5 mg bid  Per DOD, the patient should try drinking more tonight because she may be getting hydrated. She has been advised that if she feels worse tonight then she can call the on call provider or seek emergent help if needed. Message will be routed to the provider for further recommendation.

## 2019-07-20 NOTE — Telephone Encounter (Signed)
New Message   STAT if patient feels like he/she is going to faint   1) Are you dizzy now? Yes  2) Do you feel faint or have you passed out? Yes  3) Do you have any other symptoms? Disoriented (around 4 pm), feeling dizziness, and excessive urination  4) Have you checked your HR and BP (record if available)? 119/50 hr 67 (this morning) and  192/98 hr 68 (now)    New Medication:  carvedilol (COREG) 12.5 MG tablet spironolactone (ALDACTONE) 25 MG tablet torsemide (DEMADEX) 20 MG tablet gabapentin (NEURONTIN) 100 MG capsule

## 2019-07-21 NOTE — Telephone Encounter (Signed)
Agree with increasing her fluid intake. Her vitals are good. I agree with recommendations that if it persists, she should call us back and we can move up her appointment. Thanks

## 2019-07-22 ENCOUNTER — Ambulatory Visit (HOSPITAL_COMMUNITY): Payer: Medicare HMO | Attending: Cardiology

## 2019-07-22 ENCOUNTER — Other Ambulatory Visit: Payer: Self-pay

## 2019-07-22 DIAGNOSIS — R0602 Shortness of breath: Secondary | ICD-10-CM | POA: Insufficient documentation

## 2019-07-22 NOTE — Telephone Encounter (Signed)
Left message to call back  

## 2019-07-23 ENCOUNTER — Telehealth: Payer: Self-pay | Admitting: Cardiology

## 2019-07-23 NOTE — Telephone Encounter (Signed)
Spoke to patient echo results given. 

## 2019-07-23 NOTE — Telephone Encounter (Signed)
New Message ° °Patient is calling in reference to echocardiogram results.  °

## 2019-07-26 NOTE — Telephone Encounter (Signed)
Pt updated with ECHO results and MD's recommendation. Pt report she has not been taking Spironolactone since this weekend as she feels it's making her sick. BP listed below.  8/29- 146/63 8/30- 152/78, 157/69, 149/76 8/31- 121/60  Will route to MD to make aware.

## 2019-07-27 NOTE — Telephone Encounter (Signed)
It is ok to pause on the spironolactone for now. It is the best medication long term for thick/stiff heart muscle. If we need to I'd prefer to keep the spironolactone and cut back on the hydralazine. If she doesn't feel better I would bring her in for another visit. Thanks.

## 2019-07-27 NOTE — Telephone Encounter (Signed)
Pt updated with Dr. Judeth Cornfield recommendations. Pt voiced understanding and state she will try to continue with spironolactone and will call office back if it make her feel bad.

## 2019-08-05 DIAGNOSIS — J189 Pneumonia, unspecified organism: Secondary | ICD-10-CM | POA: Diagnosis not present

## 2019-08-10 ENCOUNTER — Ambulatory Visit (INDEPENDENT_AMBULATORY_CARE_PROVIDER_SITE_OTHER): Payer: Medicare HMO | Admitting: Pharmacist Clinician (PhC)/ Clinical Pharmacy Specialist

## 2019-08-10 ENCOUNTER — Other Ambulatory Visit: Payer: Self-pay

## 2019-08-10 DIAGNOSIS — I10 Essential (primary) hypertension: Secondary | ICD-10-CM

## 2019-08-10 DIAGNOSIS — Z79899 Other long term (current) drug therapy: Secondary | ICD-10-CM | POA: Diagnosis not present

## 2019-08-10 NOTE — Progress Notes (Signed)
08/11/2019 ESRAA BRAUTIGAM 05/23/1933 [id number]   HPI:  [name] is a 85 y.o. female patient of Dr Harrell Gave, with a [place] below who presents today for hypertension clinic evaluation.  She is here today with her son-in-law.  See relevant past medical history below.    Today she tells me that she doesn't have a problem with high blood pressure, that she has to take these medications to help with her "heart problems".  She also starts the visit by holding out each of her medications and noting why they aren't working correctly or causing her side effects.  She is taking her hydralazine 50 mg tid instead of the prescribed qid and recently stopped the spironolactone because it made her dizzy.  She states that the medication caused her BP to go "too low", recorded at 119/50.     "doesn't have problem with BP", doing it because have heart problems  Albuterol treatments working about an hour at [employer] twice daily Gabapentin - wants to d/c Hydralazine - actuallytaking tid  118/58   Past Medical History: Diastolic heart failure 3 pillow orthopnea  Aortic stenosis   Hyperlipidemia TC 194, TG 95, HLD 69, LDL 106  Obesity 38.4  OSA         Blood Pressure Goal:  130/80  Current Medications: carvedilol 6.25 mg bid, hydralazine 50 mg qid, spironolactone 25 mg, torsemide 20 mg  Family Hx: mother had CHF, died at 65; father died at 37 no heart issues  No known issues in siblings  Son with MI at 86(died)  Daughter with 3 stents  Social Hx:  No tobacco; no alcohol; occasional coffee or soda  Diet:  No added salt with cooking, occasionally at table; most veggies are fresh  Exercise: some walking (with walker) - family trying to get patient out (using cane when out)  Home BP readings:  124/60 this morning at home - home cuff had "a pretty good while"  Intolerances: amlodipine = dizziness, losartan = GI pain, ACEI = cough  Labs:  06/2019:  Na 142, K 4.6, Glu 95, BUN  11 SCr 0.65  Wt Readings from Last 3 Encounters:  07/16/19 231 lb 3.2 oz (104.9 kg)  07/13/19 232 lb (105.2 kg)  04/15/19 232 lb (105.2 kg)   BP Readings from Last 3 Encounters:  08/10/19 122/62  07/16/19 (!) 156/81  07/13/19 (!) 150/60   Pulse Readings from Last 3 Encounters:  08/10/19 72  07/16/19 78  07/13/19 (!) 52    Current Outpatient Medications  Medication Sig Dispense Refill  . albuterol (PROVENTIL) (2.5 MG/3ML) 0.083% nebulizer solution Take 3 mLs (2.5 mg total) by nebulization 2 (two) times daily as needed for wheezing or shortness of breath. 75 mL 2  . Aspirin-Caffeine (BACK & BODY EXTRA STRENGTH PO) Take by mouth.    . carvedilol (COREG) 12.5 MG tablet Take 1 tablet (12.5 mg total) by mouth 2 (two) times daily with a meal. 180 tablet 3  . Cholecalciferol 2000 units TABS Take 2 tablets (4,000 Units total) by mouth daily. 180 tablet 1  . clonazePAM (KLONOPIN) 1 MG tablet Take 1 tablet (1 mg total) by mouth 2 (two) times daily as needed. for anxiety 60 tablet 3  . gabapentin (NEURONTIN) 100 MG capsule Take 1 capsule (100 mg total) by mouth 2 (two) times daily. 180 capsule 0  . hydrALAZINE (APRESOLINE) 50 MG tablet Take 1 tablet (50 mg total) by mouth 4 (four) times  daily. 120 tablet 5  . mirabegron ER (MYRBETRIQ) 50 MG TB24 tablet Take 1 tablet (50 mg total) by mouth daily. 90 tablet 1  . omeprazole (PRILOSEC) 40 MG capsule Take 1 capsule (40 mg total) by mouth daily. 90 capsule 1  . promethazine (PHENERGAN) 12.5 MG tablet Take 1 tablet (12.5 mg total) by mouth every 6 (six) hours as needed for nausea or vomiting. 30 tablet 0  . torsemide (DEMADEX) 20 MG tablet Take 1 tablet (20 mg total) by mouth daily. 90 tablet 0  . traZODone (DESYREL) 100 MG tablet Take 1 tablet (100 mg total) by mouth at bedtime. 90 tablet 1  . spironolactone (ALDACTONE) 25 MG tablet Take 1 tablet (25 mg total) by mouth daily. (Patient not taking: Reported on 08/10/2019) 90 tablet 3   No current  facility-administered medications for this visit.     Allergies  Allergen Reactions  . Amlodipine Other (See Comments)    Reaction:  Dizziness   . Enalapril Cough  . Losartan Potassium Other (See Comments)    Reaction:  GI pain   . Morphine And Related Itching and Nausea And Vomiting  . Metformin Diarrhea    Past Medical History:  Diagnosis Date  . Abdominal pain   . Anemia    NOS  . Anxiety   . Back pain   . Bruises easily   . Chronic diastolic CHF (congestive heart failure) ([place])    a. 06/2013 EF 65-70%.  . Chronic headaches    HISTORY OF   . Depression   . Diabetes mellitus    type II  DIET CONTROLLED  . Diarrhea   . Hepatitis    AGE 30S  . Hyperlipidemia   . Hypertension   . Mild aortic stenosis    a. 06/2013 Echo: EF 65-70%, mod LVH with focal basal hypertrophy, very mild AS.  Marland Kitchen Osteoarthritis   . Oxygen desaturation during sleep    USES 2 LITERS BEDTIME   VIA CPAP 06/2012 [facility]   . Shortness of breath    WITH EXERTION USES 2 L O2 BEDTIME  . Sleep apnea    CPAP WITH O2 2 LITERS 2013 ([facility])  . Wears dentures   . Wears glasses     Blood pressure 122/62, pulse 72.  Essential hypertension Patient with essential hypertension.  We had a long discussion on what exactly hypertension is and why she needs medication to treat it.  She was basing her information on what a physician told her several years ago, I assume when the guidelines suggested that < 150/90 for those > 60 was acceptable.  Explained the changes and why it is important to keep her pressure controlled.  Also explained that she shouldn't be worried about pressures in the A999333 range systolic.  These should not be causing her to be symptomatic.    She has not been compliant with hydralazine, taking tid instead of qid.  I will have her cut that back to twice daily (morning and night) and have her add back in 12.5 mg of spironolactone.  Explained the dual benefits of this medication.  She is to check  home BP twice daily for the next month and return with her home cuff and list of home readings.     Tommy Medal PharmD CPP [facility] [address] [place], [place] [address] [phone]

## 2019-08-10 NOTE — Patient Instructions (Signed)
Return for a a follow up appointment in 1 month  Check your blood pressure at home daily (if able) and keep record of the readings.  Take your BP meds as follows:  AM:  Hydralazine 50 mg, carvedilol 6.25 mg, torsemide 20 mg,   NOON: spironolactone 12.5 mg (1/2 tablet)  PM:  Hydralazine 50 mg, carvedilol 6.25 mg  Bring all of your meds, your BP cuff and your record of home blood pressures to your next appointment.  Exercise as you're able, try to walk approximately 30 minutes per day.  Keep salt intake to a minimum, especially watch canned and prepared boxed foods.  Eat more fresh fruits and vegetables and fewer canned items.  Avoid eating in fast food restaurants.    HOW TO TAKE YOUR BLOOD PRESSURE: . Rest 5 minutes before taking your blood pressure. .  Don't smoke or drink caffeinated beverages for at least 30 minutes before. . Take your blood pressure before (not after) you eat. . Sit comfortably with your back supported and both feet on the floor (don't cross your legs). . Elevate your arm to heart level on a table or a desk. . Use the proper sized cuff. It should fit smoothly and snugly around your bare upper arm. There should be enough room to slip a fingertip under the cuff. The bottom edge of the cuff should be 1 inch above the crease of the elbow. . Ideally, take 3 measurements at one sitting and record the average.

## 2019-08-11 ENCOUNTER — Encounter: Payer: Self-pay | Admitting: Pharmacist Clinician (PhC)/ Clinical Pharmacy Specialist

## 2019-08-11 LAB — BASIC METABOLIC PANEL
BUN/Creatinine Ratio: 30 — ABNORMAL HIGH (ref 12–28)
BUN: 21 mg/dL (ref 8–27)
CO2: 28 mmol/L (ref 20–29)
Calcium: 9.6 mg/dL (ref 8.7–10.3)
Chloride: 97 mmol/L (ref 96–106)
Creatinine, Ser: 0.71 mg/dL (ref 0.57–1.00)
GFR calc Af Amer: 90 mL/min/{1.73_m2} (ref >59)
GFR calc non Af Amer: 78 mL/min/{1.73_m2} (ref >59)
Glucose: 99 mg/dL (ref 65–99)
Potassium: 4.2 mmol/L (ref 3.5–5.2)
Sodium: 141 mmol/L (ref 134–144)

## 2019-08-11 NOTE — Assessment & Plan Note (Addendum)
Patient with essential hypertension.  We had a long discussion on what exactly hypertension is and why she needs medication to treat it.  She was basing her information on what a physician told her several years ago, I assume when the guidelines suggested that < 150/90 for those > 60 was acceptable.  Explained the changes and why it is important to keep her pressure controlled.  Also explained that she shouldn't be worried about pressures in the A999333 range systolic.  These should not be causing her to be symptomatic.    She has not been compliant with hydralazine, taking tid instead of qid.  I will have her cut that back to twice daily (morning and night) and have her add back in 12.5 mg of spironolactone.  Explained the dual benefits of this medication.  She is to check home BP twice daily for the next month and return with her home cuff and list of home readings.

## 2019-08-18 NOTE — Progress Notes (Addendum)
Subjective:   [name] is a 85 y.o. female who presents for an Initial Medicare Annual Wellness Visit.  Review of Systems     Cardiac Risk Factors include: advanced age (>78men, >24 women);hypertension;obesity (BMI >30kg/m2);sedentary lifestyle Sleep patterns: has interrupted sleep, gets up 3 times nightly to void and sleeps hours varies nightly.  Patient reports insomnia issues, discussed recommended sleep tips.    Home Safety/Smoke Alarms: Feels safe in home. Smoke alarms in place.  Living environment; residence and Firearm Safety: 1-story house/ trailer, equipment: Walkers, Type: Conservation officer, nature. Lives alone, needs tub bench DME, good support system Seat Belt Safety/Bike Helmet: Wears seat belt.     Objective:    Today's Vitals   08/19/19 1412 08/19/19 1535  BP: 133/72   Pulse: 72   Resp: 19   SpO2: 96%   Weight: 234 lb (106.1 kg)   Height: 5\' 5"  (1.651 m)   PainSc:  2    Body mass index is 38.94 kg/m.  Advanced Directives 08/19/2019 01/29/2019 08/26/2018 08/25/2018 11/02/2017 10/30/2017 08/05/2017  Does Patient Have a Medical Advance Directive? Yes Yes Yes No No No No  Type of Paramedic of [place];Living will [place];Living will [place]  Does patient want to make changes to medical advance directive? - No - Patient declined No - Patient declined - - - -  Copy of [place] in Chart? No - copy requested No - copy requested No - copy requested - - - -  Would patient like information on creating a medical advance directive? - - - - - No - Patient declined No - Patient declined  Pre-existing out of facility DNR order (yellow form or pink MOST form) - - - - - - -    Current Medications (verified) Outpatient Encounter Medications as of 08/19/2019  Medication Sig  . albuterol (PROVENTIL) (2.5 MG/3ML) 0.083% nebulizer solution Take 3 mLs (2.5 mg total) by nebulization 2 (two) times daily as  needed for wheezing or shortness of breath.  . Aspirin-Caffeine (BACK & BODY EXTRA STRENGTH PO) Take by mouth.  . carvedilol (COREG) 12.5 MG tablet Take 1 tablet (12.5 mg total) by mouth 2 (two) times daily with a meal.  . Cholecalciferol 2000 units TABS Take 2 tablets (4,000 Units total) by mouth daily.  . clonazePAM (KLONOPIN) 1 MG tablet Take 1 tablet (1 mg total) by mouth 2 (two) times daily as needed. for anxiety  . gabapentin (NEURONTIN) 100 MG capsule Take 1 capsule (100 mg total) by mouth 2 (two) times daily.  . hydrALAZINE (APRESOLINE) 50 MG tablet Take 1 tablet (50 mg total) by mouth 4 (four) times daily.  . mirabegron ER (MYRBETRIQ) 50 MG TB24 tablet Take 1 tablet (50 mg total) by mouth daily.  Marland Kitchen omeprazole (PRILOSEC) 40 MG capsule Take 1 capsule (40 mg total) by mouth daily.  . promethazine (PHENERGAN) 12.5 MG tablet Take 1 tablet (12.5 mg total) by mouth every 6 (six) hours as needed for nausea or vomiting.  Marland Kitchen spironolactone (ALDACTONE) 25 MG tablet Take 1 tablet (25 mg total) by mouth daily.  Marland Kitchen torsemide (DEMADEX) 20 MG tablet Take 1 tablet (20 mg total) by mouth daily.  . traZODone (DESYREL) 100 MG tablet Take 1 tablet (100 mg total) by mouth at bedtime.   No facility-administered encounter medications on file as of 08/19/2019.     Allergies (verified) Amlodipine, Enalapril, Losartan potassium, Morphine and related, and  Metformin   History: Past Medical History:  Diagnosis Date  . Abdominal pain   . Anemia    NOS  . Anxiety   . Back pain   . Bruises easily   . Chronic diastolic CHF (congestive heart failure) ([place])    a. 06/2013 EF 65-70%.  . Chronic headaches    HISTORY OF   . Depression   . Diabetes mellitus    type II  DIET CONTROLLED  . Diarrhea   . Hepatitis    AGE 30S  . Hyperlipidemia   . Hypertension   . Mild aortic stenosis    a. 06/2013 Echo: EF 65-70%, mod LVH with focal basal hypertrophy, very mild AS.  Marland Kitchen Osteoarthritis   . Oxygen desaturation during  sleep    USES 2 LITERS BEDTIME   [facility]IA CPAP [facility]/2012 [facility]   . Shortness of breath    WITH EXERTION USES 2 L O2 BEDTIME  . Sleep apnea    CPAP WITH O2 2 LITERS 2013 ([facility])  . Wears dentures   . Wears glasses    Past Surgical History:  Procedure Laterality Date  . ABDOMINAL HYSTERECTOMY    . APPENDECTOMY    . CHOLECYSTECTOMY    . JOINT REPLACEMENT     LEFT KNEE   . KNEE ARTHROPLASTY Right 03/22/2015   Procedure: COMPUTER ASSISTED TOTAL KNEE ARTHROPLASTY;  Surgeon: Marybelle Killings, MD;  Location: [facility];  Service: Orthopedics;  Laterality: Right;  . KNEE ARTHROSCOPY     RIGHT  . LEFT AND RIGHT HEART CATHETERIZATION WITH CORONARY ANGIOGRAM N/A 10/28/2013   Procedure: LEFT AND RIGHT HEART CATHETERIZATION WITH CORONARY ANGIOGRAM;  Surgeon: Blane Ohara, MD;  Location: [facility] CATH LAB;  Service: Cardiovascular;  Laterality: N/A;  . LEFT HEART CATHETERIZATION WITH CORONARY ANGIOGRAM N/A 11/14/2014   Procedure: LEFT HEART CATHETERIZATION WITH CORONARY ANGIOGRAM;  Surgeon: Josue Hector, MD;  Location: [facility] CATH LAB;  Service: Cardiovascular;  Laterality: N/A;  . REVISION TOTAL KNEE ARTHROPLASTY  2011  . ROTATOR CUFF REPAIR  2010  . SHOULDER ARTHROSCOPY  09/17/2012   Procedure: ARTHROSCOPY SHOULDER;  Surgeon: Sharmon Revere, MD;  Location: [facility];  Service: Orthopedics;  Laterality: Left;  . TONSILLECTOMY    . TONSILLECTOMY     Family History  Problem Relation Age of Onset  . Cancer Father        prostate cancer  . Heart disease Sister   . Heart disease Brother   . Alcohol abuse Other   . Hypertension Other   . Kidney disease Other   . Mental illness Other   . Emphysema Brother    Social History   Socioeconomic History  . Marital status: Widowed    Spouse name: Not on file  . Number of children: 5  . Years of education: Not on file  . Highest education level: Not on file  Occupational History  . Occupation: retired     Fish farm manager: RETIRED    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[employer]  . Financial resource strain: Not hard at all  . Food insecurity    Worry: Never true    Inability: Never true  . Transportation needs    Medical: No    Non-medical: No  Tobacco Use  . Smoking status: Never Smoker  . Smokeless tobacco: Never Used  Substance and Sexual Activity  . Alcohol use: No  . Drug use: No  . Sexual activity: Never  Lifestyle  . Physical activity  Days per week: 0 days    Minutes per session: 0 min  . Stress: Only a little  Relationships  . Social connections    Talks on phone: More than three times a week    Gets together: More than three times a week    Attends religious service: 1 to 4 times per year    Active member of club or organization: Yes    Attends meetings of clubs or organizations: 1 to 4 times per year    Relationship status: Widowed  Other Topics Concern  . Not on file  Social History Narrative  . Not on file    Tobacco Counseling Counseling given: Not Answered  Activities of Daily Living In your present state of health, do you have any difficulty performing the following activities: 08/19/2019 01/29/2019  Hearing? N -  [place]? N -  Comment - -  Difficulty concentrating or making decisions? Y -  Comment - -  Walking or climbing stairs? N -  Comment - -  Dressing or bathing? N -  Comment - -  Doing errands, shopping? N N  Preparing Food and eating ? N -  Using the Toilet? N -  In the past six months, have you accidently leaked urine? Y -  Do you have problems with loss of bowel control? N -  Managing your Medications? N -  Managing your Finances? Y -  Housekeeping or managing your Housekeeping? Y -  Some recent data might be hidden     Immunizations and Health Maintenance Immunization History  Administered Date(s) Administered  . Fluad Quad(high Dose 65+) 08/19/2019  . Influenza Split 08/20/2012  . Influenza, High Dose Seasonal PF 08/02/2015, 09/10/2016, 09/17/2017, 09/15/2018  .  Influenza,inj,Quad PF,6+ Mos 10/28/2013, 11/28/2014  . Pneumococcal Conjugate-13 05/25/2014  . Pneumococcal Polysaccharide-23 04/16/2012, 09/15/2018  . Td 04/25/2010  . Zoster 11/28/2014   There are no preventive care reminders to display for this patient.  Patient Care Team: [name], MD as PCP - General [name], MD as Consulting Physician (Cardiology) Hortencia Pilar, MD as Consulting Physician (Surgery)  Indicate any recent Medical Services you may have received from other than [facility] providers in the past year (date may be approximate).     Assessment:   This is a routine wellness examination for [name]. Physical assessment deferred to PCP.  Hearing/Vision screen  Hearing Screening   125Hz  250Hz  500Hz  1000Hz  2000Hz  3000Hz  4000Hz  6000Hz  8000Hz   Right ear:           Left ear:           Comments: HOH, has hearing aids  Vision Screening Comments: appointment yearly   Dietary issues and exercise activities discussed: Current Exercise Habits: The patient does not participate in regular exercise at present(chair exercise printouts provided), Exercise limited by: orthopedic condition(s) Diet (meal preparation, eat out, water intake, caffeinated beverages, dairy products, fruits and vegetables): in general, a "healthy" diet   Reports poor appetite.   Reviewed heart healthy diet. Encouraged patient to increase daily water and healthy fluid intake.  Discussed supplementing with Ensure, samples and coupons provided.  Goals    . Patient Stated     Continue to take care of my plants. Increase my physical activity. Enjoy life and family.      Depression Screen PHQ 2/9 Scores 08/19/2019 07/13/2019 01/05/2019 03/30/2018 12/17/2017 04/24/2017 07/21/2015  PHQ - 2 Score 3 4 3 3 2  0 0  PHQ- 9 Score 9  9 10 10 7  - -  Exception Documentation - - - - - - -  Not completed - - - - - - -    Fall Risk Fall Risk  08/19/2019 07/13/2019 01/05/2019 05/15/2018 04/24/2017  Falls in the  past year? 1 0 0 Yes No  Number falls in past yr: 0 0 0 2 or more -  Injury with Fall? 0 0 0 Yes -  Risk Factor Category  - - - High Fall Risk -  Risk for fall due to : History of fall(s);Impaired balance/gait;Impaired mobility Impaired balance/gait;Medication side effect Impaired balance/gait;Medication side effect;Mental status change History of fall(s) -  Follow up - Falls evaluation completed;Education provided Falls evaluation completed;Education provided Falls evaluation completed -    Is the patient's home free of loose throw rugs in walkways, pet beds, electrical cords, etc?   yes      Grab bars in the bathroom? yes      Handrails on the stairs?   yes      Adequate lighting?   yes   Cognitive Function:     6CIT Screen 08/19/2019  What Year? 0 points  What month? 0 points  What time? 0 points  Count back from 20 2 points  Months in reverse 4 points  Repeat phrase 2 points  Total Score 8    Screening Tests Health Maintenance  Topic Date Due  . INFLUENZA VACCINE  02/24/2020 (Originally 06/26/2019)  . TETANUS/TDAP  04/25/2020  . DEXA SCAN  Completed  . PNA vac Low Risk Adult  Completed     Plan:    Reviewed health maintenance screenings with patient today and relevant education, vaccines, and/or referrals were provided.    I have personally reviewed and noted the following in the patient's chart:   . Medical and social history . Use of alcohol, tobacco or illicit drugs  . Current medications and supplements . Functional ability and status . Nutritional status . Physical activity . Advanced directives . List of other physicians . Vitals . Screenings to include cognitive, depression, and falls . Referrals and appointments  In addition, I have reviewed and discussed with patient certain preventive protocols, quality metrics, and best practice recommendations. A written personalized care plan for preventive services as well as general preventive health  recommendations were provided to patient.     Michiel Cowboy, RN   08/19/2019     Medical screening examination/treatment/procedure(s) were performed by non-physician practitioner and as supervising physician I was immediately available for consultation/collaboration. I agree with above. Scarlette Calico, MD

## 2019-08-19 ENCOUNTER — Other Ambulatory Visit: Payer: Self-pay | Admitting: Internal Medicine

## 2019-08-19 ENCOUNTER — Telehealth: Payer: Self-pay | Admitting: *Deleted

## 2019-08-19 ENCOUNTER — Ambulatory Visit (INDEPENDENT_AMBULATORY_CARE_PROVIDER_SITE_OTHER): Payer: Medicare HMO | Admitting: *Deleted

## 2019-08-19 ENCOUNTER — Other Ambulatory Visit: Payer: Self-pay

## 2019-08-19 VITALS — BP 133/72 | HR 72 | Resp 19 | Ht 65.0 in | Wt 234.0 lb

## 2019-08-19 DIAGNOSIS — R059 Cough, unspecified: Secondary | ICD-10-CM

## 2019-08-19 DIAGNOSIS — Z0000 Encounter for general adult medical examination without abnormal findings: Secondary | ICD-10-CM | POA: Diagnosis not present

## 2019-08-19 DIAGNOSIS — Z9181 History of falling: Secondary | ICD-10-CM | POA: Diagnosis not present

## 2019-08-19 DIAGNOSIS — I5032 Chronic diastolic (congestive) heart failure: Secondary | ICD-10-CM | POA: Diagnosis not present

## 2019-08-19 DIAGNOSIS — R05 Cough: Secondary | ICD-10-CM

## 2019-08-19 DIAGNOSIS — Z23 Encounter for immunization: Secondary | ICD-10-CM

## 2019-08-19 MED ORDER — BENZONATATE 200 MG PO CAPS: 200.0000 mg | ORAL_CAPSULE | Freq: Two times a day (BID) | ORAL | 0 refills | Status: DC | PRN

## 2019-08-19 NOTE — Telephone Encounter (Signed)
During AWV, Patient states that she coughs a lot at night. She had a prescription for Benzonatate 200 mg which helps her. She would like a refill for this medication if possible it helps her sleep.

## 2019-08-19 NOTE — Patient Instructions (Addendum)
If you cannot attend class in person, you can still exercise at home. Video taped versions of AHOY classes are shown on [employer] (GTN) at 8 am and 1 pm Mondays through Fridays. You can also purchase a copy of the AHOY DVD by calling [place] (GTN) [employer]. GTN is available on [employer] channel 13 with a digital cable box and on [employer] channel 31. GTN is also available on [employer] U[employer], channel 99. To view GTN, go to channel 99, press OK, select [place], then select GTN to start the channel.  Continue to eat heart healthy diet (full of fruits, vegetables, whole grains, lean protein, water--limit salt, fat, and sugar intake) and increase physical activity as tolerated.   Continue doing brain stimulating activities (puzzles, reading, adult coloring books, staying active) to keep memory sharp.    [name] , Thank you for taking time to come for your Medicare Wellness Visit. I appreciate your ongoing commitment to your health goals. Please review the following plan we discussed and let me know if I can assist you in the future.   These are the goals we discussed: Goals    . Patient Stated     Continue to take care of my plants. Increase my physical activity. Enjoy life and family.       This is a list of the screening recommended for you and due dates:  Health Maintenance  Topic Date Due  . Flu Shot  02/24/2020*  . Tetanus Vaccine  04/25/2020  . DEXA scan (bone density measurement)  Completed  . Pneumonia vaccines  Completed  *Topic was postponed. The date shown is not the original due date.    Preventive Care 37 Years and Older, Female Preventive care refers to lifestyle choices and visits with your health care provider that can promote health and wellness. This includes:  A yearly physical exam. This is also called an annual well check.  Regular dental and eye exams.  Immunizations.  Screening for certain  conditions.  Healthy lifestyle choices, such as diet and exercise. What can I expect for my preventive care visit? Physical exam Your health care provider will check:  Height and weight. These may be used to calculate body mass index (BMI), which is a measurement that tells if you are at a healthy weight.  Heart rate and blood pressure.  Your skin for abnormal spots. Counseling Your health care provider may ask you questions about:  Alcohol, tobacco, and drug use.  Emotional well-being.  Home and relationship well-being.  Sexual activity.  Eating habits.  History of falls.  Memory and ability to understand (cognition).  Work and work Statistician.  Pregnancy and menstrual history. What immunizations do I need?  Influenza (flu) vaccine  This is recommended every year. Tetanus, diphtheria, and pertussis (Tdap) vaccine  You may need a Td booster every 10 years. Varicella (chickenpox) vaccine  You may need this vaccine if you have not already been vaccinated. Zoster (shingles) vaccine  You may need this after age 41. Pneumococcal conjugate (PCV13) vaccine  One dose is recommended after age 23. Pneumococcal polysaccharide (PPSV23) vaccine  One dose is recommended after age 11. Measles, mumps, and rubella (MMR) vaccine  You may need at least one dose of MMR if you were born in 1957 or later. You may also need a second dose. Meningococcal conjugate (MenACWY) vaccine  You may need this if you have certain conditions. Hepatitis A vaccine  You may need this if you  have certain conditions or if you travel or work in places where you may be exposed to hepatitis A. Hepatitis B vaccine  You may need this if you have certain conditions or if you travel or work in places where you may be exposed to hepatitis B. Haemophilus influenzae type b (Hib) vaccine  You may need this if you have certain conditions. You may receive vaccines as individual doses or as more than  one vaccine together in one shot (combination vaccines). Talk with your health care provider about the risks and benefits of combination vaccines. What tests do I need? Blood tests  Lipid and cholesterol levels. These may be checked every 5 years, or more frequently depending on your overall health.  Hepatitis C test.  Hepatitis B test. Screening  Lung cancer screening. You may have this screening every year starting at age 60 if you have a 30-pack-year history of smoking and currently smoke or have quit within the past 15 years.  Colorectal cancer screening. All adults should have this screening starting at age 67 and continuing until age 39. Your health care provider may recommend screening at age 49 if you are at increased risk. You will have tests every 1-10 years, depending on your results and the type of screening test.  Diabetes screening. This is done by checking your blood sugar (glucose) after you have not eaten for a while (fasting). You may have this done every 1-3 years.  Mammogram. This may be done every 1-2 years. Talk with your health care provider about how often you should have regular mammograms.  BRCA-related cancer screening. This may be done if you have a family history of breast, ovarian, tubal, or peritoneal cancers. Other tests  Sexually transmitted disease (STD) testing.  Bone density scan. This is done to screen for osteoporosis. You may have this done starting at age 36. Follow these instructions at home: Eating and drinking  Eat a diet that includes fresh fruits and vegetables, whole grains, lean protein, and low-fat dairy products. Limit your intake of foods with high amounts of sugar, saturated fats, and salt.  Take vitamin and mineral supplements as recommended by your health care provider.  Do not drink alcohol if your health care provider tells you not to drink.  If you drink alcohol: ? Limit how much you have to 0-1 drink a day. ? Be aware of how  much alcohol is in your drink. In the [place] one drink equals one 12 oz bottle of beer (355 mL), one 5 oz glass of wine (148 mL), or one 1 oz glass of hard liquor (44 mL). Lifestyle  Take daily care of your teeth and gums.  Stay active. Exercise for at least 30 minutes on 5 or more days each week.  Do not use any products that contain nicotine or tobacco, such as cigarettes, e-cigarettes, and chewing tobacco. If you need help quitting, ask your health care provider.  If you are sexually active, practice safe sex. Use a condom or other form of protection in order to prevent STIs (sexually transmitted infections).  Talk with your health care provider about taking a low-dose aspirin or statin. What's next?  Go to your health care provider once a year for a well check visit.  Ask your health care provider how often you should have your eyes and teeth checked.  Stay up to date on all vaccines. This information is not intended to replace advice given to you by your health care provider. Make  sure you discuss any questions you have with your health care provider. Document Released: 12/08/2015 Document Revised: 11/05/2018 Document Reviewed: 11/05/2018 Elsevier Patient Education  2020 [employer].

## 2019-08-23 ENCOUNTER — Ambulatory Visit: Payer: Medicare HMO | Admitting: *Deleted

## 2019-08-24 ENCOUNTER — Other Ambulatory Visit: Payer: Self-pay | Admitting: *Deleted

## 2019-08-24 DIAGNOSIS — I5032 Chronic diastolic (congestive) heart failure: Secondary | ICD-10-CM | POA: Diagnosis not present

## 2019-08-24 DIAGNOSIS — M1990 Unspecified osteoarthritis, unspecified site: Secondary | ICD-10-CM | POA: Diagnosis not present

## 2019-08-24 DIAGNOSIS — R569 Unspecified convulsions: Secondary | ICD-10-CM | POA: Diagnosis not present

## 2019-08-24 NOTE — Patient Outreach (Signed)
[facility]) Care Management  08/24/2019  [name] 03/08/1933 [id number]   Referral Date: 08/19/2019 Referral Source: MD office Referral Reason: CHF education (diet consist of a lot of pre-made foods high in sodium), support and scale. Patient needs assistance to be able to afford Myrbetric medication Insurance: [facility] attempt # 1, successful.  Social: Member lives alone but report she has several family members that live close to her.  She uses a walker for stability but is able to perform ADL's independently.    Conditions: Per chart, history of aortic stenosis, CHF, OSA, GERD, and HLD.    Medications: Report able to take independently but trouble affording a couple of them.   She is also interested in receiving medications via mail order.  Appointments: Medicare well visit complete on 9/24, next one scheduled a year from now.  She is aware of when to contact office for earlier appointment.  Follow up with cardiology on 11/24.  Denies the need for transportation, state daughter, son in law, or grandchildren will take her.  Screening complete, denies any complex needs but does agree she need more education on heart failure management.  She does have a scale and monitor her blood pressure and weight daily.     Plan: RN CM will place referral to care [name] and pharmacist.  Lisa David, RN, MSN [name] [phone]

## 2019-08-25 ENCOUNTER — Other Ambulatory Visit: Payer: Self-pay | Admitting: Pharmacist

## 2019-08-25 NOTE — Patient Outreach (Signed)
[name]) Care Management  [place]   08/25/2019  [name] Apr 11, 1933 [id number]  Reason for referral: Medication Assistance with Myrbetriq  Referral source: Dr. Ronnald Ramp Current insurance: [facility]  PMHx includes but not limited to:  Diastolic CHF, aortic stenosis, OSA, GERD, HTN, HLD, T2DM (diet controlled)  Outreach:  Successful telephone call with patient.  HIPAA identifiers verified.   Subjective:  Patient reports she is now in the coverage gap and co-pay for medication increased from $45 to > $100.  She confirms that she recently had o/v with PharmD from cardiology office and has made recommended medication changes including resuming 1/2 tablet of spironolactone (12.39m) at 12:00PM and taking hydralazine BID.  She reports she has been recording blood pressures and has log ready to turn in at next office visit.  Reports most recent BP is 112/60-70.  Agreeable to review medications.    Objective: The ASCVD Risk score (Lisa BussingDC Jr., et al., 2013) failed to calculate for the following reasons:   The 2013 ASCVD risk score is only valid for ages 432to 790 Lab Results  Component Value Date   CREATININE 0.71 08/10/2019   CREATININE 0.65 07/13/2019   CREATININE 0.72 02/01/2019    Lab Results  Component Value Date   HGBA1C 5.0 08/26/2018    Lipid Panel     Component Value Date/Time   CHOL 194 08/26/2018 0116   TRIG 95 08/26/2018 0116   HDL 69 08/26/2018 0116   CHOLHDL 2.8 08/26/2018 0116   VLDL 19 08/26/2018 0116   LDLCALC 106 (H) 08/26/2018 0116   LDLDIRECT 121.0 06/12/2017 1349    BP Readings from Last 3 Encounters:  08/19/19 133/72  08/10/19 122/62  07/16/19 (!) 156/81    Allergies  Allergen Reactions  . Amlodipine Other (See Comments)    Reaction:  Dizziness   . Enalapril Cough  . Losartan Potassium Other (See Comments)    Reaction:  GI pain   . Morphine And Related Itching and Nausea And Vomiting  . Metformin Diarrhea     Medications Reviewed Today    Reviewed by WMichiel Cowboy RN (Registered Nurse) on 08/19/19 at 1357  Med List Status: <None>  Medication Order Taking? Sig Documenting Provider Last Dose Status Informant  albuterol (PROVENTIL) (2.5 MG/3ML) 0.083% nebulizer solution [id number] Take 3 mLs (2.5 mg total) by nebulization 2 (two) times daily as needed for wheezing or shortness of breath. JJanith Lima MD Taking Active   Aspirin-Caffeine (BACK & BODY EXTRA STRENGTH PO) [id number] Take by mouth. [provider] Taking Active   carvedilol (COREG) 12.5 MG tablet [id number] Take 1 tablet (12.5 mg total) by mouth 2 (two) times daily with a meal. CBuford Dresser MD Taking Active   Cholecalciferol 2000 units TABS [id number] Take 2 tablets (4,000 Units total) by mouth daily. JJanith Lima MD Taking Active Self  clonazePAM ([name] 1 MG tablet [id number] Take 1 tablet (1 mg total) by mouth 2 (two) times daily as needed. for anxiety JJanith Lima MD Taking Active   gabapentin (NEURONTIN) 100 MG capsule [id number] Take 1 capsule (100 mg total) by mouth 2 (two) times daily. JJanith Lima MD Taking Active   hydrALAZINE (APRESOLINE) 50 MG tablet [id number] Take 1 tablet (50 mg total) by mouth 4 (four) times daily. JJanith Lima MD Taking Active   mirabegron ER ([facility] 50 MG TB24 tablet [id number] Take 1 tablet (50 mg total) by  mouth daily. Lisa Lima, MD Taking Active   omeprazole (PRILOSEC) 40 MG capsule [id number] Yes Take 1 capsule (40 mg total) by mouth daily. Lisa Lima, MD Taking Active   promethazine (PHENERGAN) 12.5 MG tablet [id number] Yes Take 1 tablet (12.5 mg total) by mouth every 6 (six) hours as needed for nausea or vomiting. Lisa Lima, MD Taking Active   spironolactone (ALDACTONE) 25 MG tablet [id number] Yes Take 1 tablet (25 mg total) by mouth daily. [name], MD Taking Active   torsemide [facility]) 20 MG tablet  [id number] Yes Take 1 tablet (20 mg total) by mouth daily. Lisa Lima, MD Taking Active   traZODone (DESYREL) 100 MG tablet [id number] Yes Take 1 tablet (100 mg total) by mouth at bedtime. Lisa Lima, MD Taking Active           Assessment: Drugs sorted by system:  Neurologic/Psychologic: clonazepam, gabapentin, trazodone  Cardiovascular: carvedilol, hydralazine, spironolactone, torsemide  Pulmonary/Allergy: albuterol nebulizer, benzonatate  Gastrointestinal: omeprazole, promethazine   Pain: aspirin-caffeine  Genitourinary: mirabegron ER  Vitamins/Minerals/Supplements: cholecalciferol  Medication Review Findings:  . Taking spironolactone and hydralazine as directed from PharmD on 9/15 o/v and recording BP values.  Encouraged patient to continue keeping track of BP and to contact office if any concerns arise.  Patient reports she has f/u visit in 2 weeks and will bring in log.   . Vitamin D: taking high dose of 10,000 units / day.  Recommended that patient have Vitamin D level checked and to consider reducing to 1000 units BID.  Last Vit D level WNL in 2013.  Marland Kitchen Promethazine: This medication is on the Beers list and should be avoided in patients 35 and older due to its potent anticholinergic properties and reduced clearance.Taking PRN currently.  Monitor use closely  . Clonazepam: This drug is identified in the Beers Criteria as a potentially inappropriate medication to be avoided in patients 65 years and older (independent of diagnosis or condition) due to increased risk of impaired cognition, delirium, falls, fractures, and motor vehicle accidents with benzodiazepine use.  Monitor use closely and adjust as clinically warranted.    Medication Assistance Findings:  Medication assistance needs identified: Myrbetriq  Extra Help:  Not eligible for Extra Help Low Income Subsidy based on reported income and assets  Patient Assistance Programs: Myrbetriq made by  [place] requirement met: Yes o Out-of-pocket prescription expenditure met:   Not Applicable - Patient has not met application requirements to apply for this program at this time.  as patient as coverage for medication through insurance.  Patient assistance program requires no medication coverage for eligibility.    Health Coach referral also placed for CHF education.   Plan: . Will close [facility] pharmacy case as no further medication needs identified at this time.  Am happy to assist in the future as needed.     Ralene Bathe, PharmD, [place] [phone]

## 2019-08-27 ENCOUNTER — Other Ambulatory Visit: Payer: Self-pay

## 2019-08-27 NOTE — Patient Outreach (Addendum)
[facility]) Care Management  08/27/2019  [name] 11/01/1933 [id number]     Referral Date: 08/25/2019 Referral Source: [facility]  Referral Reason: "ongoing disease mgmt for HF mgmt"  Insurance: [employer]   Outreach attempt #1 to patient. Spoke briefly with patient as she reported she had company in the home and could only talk for a few minutes. RN CM discussed referral with patient. She is in agreement and requested RN CM call back at another time to complete initial assessment. She states she has an appt on 08/30/2019 but otherwise is free after that.     Plan: RN CM will make outreach call to patient within two weeks.  RN CM will send welcome letter to patient.    [name], RN,BSN,CCM [place] [facility] Telephonic Care Management Coordinator Direct Phone: [phone] Toll Free: [phone] Fax: [phone]

## 2019-08-29 ENCOUNTER — Other Ambulatory Visit: Payer: Self-pay | Admitting: Internal Medicine

## 2019-08-29 DIAGNOSIS — F411 Generalized anxiety disorder: Secondary | ICD-10-CM

## 2019-08-29 DIAGNOSIS — N3281 Overactive bladder: Secondary | ICD-10-CM

## 2019-08-29 MED ORDER — MIRABEGRON ER 50 MG PO TB24: 50.0000 mg | ORAL_TABLET | Freq: Every day | ORAL | 1 refills | Status: DC

## 2019-08-29 MED ORDER — CLONAZEPAM 1 MG PO TABS: 1.0000 mg | ORAL_TABLET | Freq: Two times a day (BID) | ORAL | 3 refills | Status: DC | PRN

## 2019-08-31 ENCOUNTER — Other Ambulatory Visit: Payer: Self-pay

## 2019-08-31 NOTE — Patient Outreach (Signed)
[facility]) Care Management  08/31/2019  KAMILLIA SELF [birth year] [id number]   Initial Assessment    Outreach attempt to patient. Partial assessment completed as patient voiced she was not feeling well. She states that she drank an Ensure but shortly afterwards she started felling "sick on stomach." She has taken some OTC med to help with issue. She reports she also has Phenergan in the home but doesn't like to take it as it is "strong" and makes her "sleep all day." RN CM also offered some non-pharmacological measures to assist with issue. Patient voices that she went to ear MD on yesterday and had her hearing aides adjusted. She states she has had these hearing aides for about 3 months or so and is doing well with them. She also voices that her shower chair came and has been helpful for her to use to stay safe. Patient states she lives alone but has very supportive family nearby that check on her as needed. She is able to drive herself to most appts or otherwise her family takes her. Patient then began to report that when she talks for a long time she gets a little SOB. Patient requested that RN CM call her back another day to complete assessment so that she could go take breathing tx.   Plan: RN CM will make outreach attempt to patient within two weeks.   [name], RN,BSN,CCM [place] [facility] Telephonic Care Management Coordinator Direct Phone: [phone] Toll Free: [phone] Fax: [phone]

## 2019-09-04 DIAGNOSIS — J189 Pneumonia, unspecified organism: Secondary | ICD-10-CM | POA: Diagnosis not present

## 2019-09-06 ENCOUNTER — Other Ambulatory Visit: Payer: Self-pay

## 2019-09-06 NOTE — Patient Outreach (Signed)
[facility]) Care Management  09/06/2019   [name] 23-Jan-1933 [id number]  Initial Assessment   Outreach attempt #1 to patient. Spoke with patient who reports she is feeling better and her symptoms from last week have resolved. She states that she has been having some intermittent arthritis pain related to rainy weather. She denies any pain at present.  Conditions: Patient has PMH that includes but not limited to aortic stenosis. CHF, OSA, GERD and HLD. She reports that she is checking BP in the home 2x/day. BP ranging in the low 100s to 120s. Patient has scale in the home. She voices that she weighs daily but does not weigh at consistent time or use consistent technique. RN CM reviewed with pt. importance of daily weighing and proper time and technique for weighing. Encouraged pt. to begin keeping weight log for MD to review at future appts. She voices that she is adhering to diuretic regimen as well as low salt diet.   Appointments: Patient saw PCP last on 08/19/2019. She goes to see heart specialist on 10/19/2019. She denies any issues with transportation.  Medications: Med review competed with patient. She denies any issues managing and/or affording her meds at this time.  Current Medications:  Current Outpatient Medications  Medication Sig Dispense Refill  . albuterol (PROVENTIL) (2.5 MG/3ML) 0.083% nebulizer solution Take 3 mLs (2.5 mg total) by nebulization 2 (two) times daily as needed for wheezing or shortness of breath. 75 mL 2  . Aspirin-Caffeine (BACK & BODY EXTRA STRENGTH PO) Take by mouth.    . benzonatate (TESSALON) 200 MG capsule Take 1 capsule (200 mg total) by mouth 2 (two) times daily as needed for cough. 180 capsule 0  . carvedilol (COREG) 12.5 MG tablet Take 1 tablet (12.5 mg total) by mouth 2 (two) times daily with a meal. 180 tablet 3  . Cholecalciferol 2000 units TABS Take 2 tablets (4,000 Units total) by mouth daily. 180 tablet 1  . clonazePAM  (KLONOPIN) 1 MG tablet Take 1 tablet (1 mg total) by mouth 2 (two) times daily as needed. for anxiety 60 tablet 3  . gabapentin (NEURONTIN) 100 MG capsule Take 1 capsule (100 mg total) by mouth 2 (two) times daily. 180 capsule 0  . hydrALAZINE (APRESOLINE) 50 MG tablet Take 1 tablet (50 mg total) by mouth 4 (four) times daily. 120 tablet 5  . mirabegron ER (MYRBETRIQ) 50 MG TB24 tablet Take 1 tablet (50 mg total) by mouth daily. 90 tablet 1  . omeprazole (PRILOSEC) 40 MG capsule Take 1 capsule (40 mg total) by mouth daily. 90 capsule 1  . promethazine (PHENERGAN) 12.5 MG tablet Take 1 tablet (12.5 mg total) by mouth every 6 (six) hours as needed for nausea or vomiting. 30 tablet 0  . spironolactone (ALDACTONE) 25 MG tablet Take 1 tablet (25 mg total) by mouth daily. (Patient taking differently: Take 12.5 mg by mouth daily. ) 90 tablet 3  . torsemide (DEMADEX) 20 MG tablet Take 1 tablet (20 mg total) by mouth daily. 90 tablet 0  . traZODone (DESYREL) 100 MG tablet Take 1 tablet (100 mg total) by mouth at bedtime. 90 tablet 1   No current facility-administered medications for this visit.     Functional Status:  In your present state of health, do you have any difficulty performing the following activities: 08/31/2019 08/19/2019  Hearing? Y N  Vision? N N  Difficulty concentrating or making decisions? N Y  Walking or climbing stairs? Aggie Moats  Comment uses cane -  Dressing or bathing? N N  Doing errands, shopping? N N  Preparing Food and eating ? N N  Using the Toilet? N N  In the past six months, have you accidently leaked urine? - Y  Do you have problems with loss of bowel control? N N  Managing your Medications? N N  Managing your Finances? N Y  Housekeeping or managing your Housekeeping? N Y  Some recent data might be hidden    Fall/Depression Screening: Fall Risk  08/31/2019 08/19/2019 07/13/2019  Falls in the past year? 0 1 0  Number falls in past yr: 0 0 0  Injury with Fall? 0 0 0   Risk Factor Category  - - -  Risk for fall due to : Impaired balance/gait;Impaired mobility;Medication side effect History of fall(s);Impaired balance/gait;Impaired mobility Impaired balance/gait;Medication side effect  Follow up - - Falls evaluation completed;Education provided   [facility] 2/9 Scores 08/24/2019 08/19/2019 07/13/2019 01/05/2019 03/30/2018 12/17/2017 04/24/2017  PHQ - 2 Score 0 '3 4 3 3 2 '$ 0  PHQ- 9 Score - '9 9 10 10 7 '$ -  Exception Documentation - - - - - - -  Not completed - - - - - - -    Assessment:  [facility] CM Care Plan Problem One     Most Recent Value  Care Plan Problem One  -- [Patient at risk for falls as evidenced by impaired mobility]  Role Documenting the Problem One  Care Management Telephonic [place] for Problem One  Active  [facility] Long Term Goal   Patient will report no falls over the next 60 days.   [facility] Long Term Goal Start Date  08/31/19  Interventions for Problem One Long Term Goal  RN CM assessed for falls/injuries. RNCM confirmed that pt. has received new DME in the home to aide in safety.  [facility] CM Short Term Goal #1   Patient will report adherence to using DME in the home 100% of the time over the next 30 days.  [facility] CM Short Term Goal #1 Start Date  08/31/19  Interventions for Short Term Goal #1  RN CM confirmed that pt. is using DME. RN CM reinforced fall/safety measures with pt.    [facility]N CM Care Plan Problem Two     Most Recent Value  Care Plan Problem Two  Knowledge deficit related to mgmt of HF.  Role Documenting the Problem Two  Care Management Telephonic Coordinator  Care Plan for Problem Two  Active  [facility] Long Term Goal  patient will have no exacerbations and hospital admissions related to HF over the next 60 days.  [facility] CM Short Term Goal #1   Patient will report monitoring and recording BP and weight daily in the home over the next 30 days.  [facility] CM Short Term Goal #1 Start Date  09/06/19  [facility] CM Short Term Goal #1 Met Date   09/06/19  Interventions  for Short Term Goal #2   RN CM confirmed that pt. has both scale and BP in the home. RN CM reviewed with pt. proper weighing technique. RN CM educated pt. on importance of recording BP/weight values and sharing info with MD.  [facility] CM Short Term Goal #2   Patient will report adherence to med regimen including diuretics over the next 30 days.  [facility]N CM Short Term Goal #2 Start Date  09/06/19  Interventions for Short Term Goal #2  RN CM completed med review with pt. RN  CM confirmed that pt. understands her meds and knows how to properly take them.      Plan:  RN CM will make outreach attempt to patient within a month. RN CM will send barriers letter and route encounter to PCP.  [name], RN,BSN,CCM [place] [facility] Telephonic Care Management Coordinator Direct Phone: [phone] Toll Free: [phone] Fax: [phone]

## 2019-09-07 ENCOUNTER — Ambulatory Visit: Payer: Medicare HMO

## 2019-09-08 ENCOUNTER — Other Ambulatory Visit: Payer: Self-pay | Admitting: Internal Medicine

## 2019-09-08 DIAGNOSIS — R569 Unspecified convulsions: Secondary | ICD-10-CM

## 2019-09-08 DIAGNOSIS — F411 Generalized anxiety disorder: Secondary | ICD-10-CM

## 2019-09-08 MED ORDER — GABAPENTIN 100 MG PO CAPS: 100.0000 mg | ORAL_CAPSULE | Freq: Two times a day (BID) | ORAL | 1 refills | Status: DC

## 2019-09-09 ENCOUNTER — Other Ambulatory Visit: Payer: Self-pay

## 2019-09-09 ENCOUNTER — Ambulatory Visit: Payer: Medicare HMO

## 2019-09-09 ENCOUNTER — Ambulatory Visit (INDEPENDENT_AMBULATORY_CARE_PROVIDER_SITE_OTHER): Payer: Medicare HMO | Admitting: Pharmacist Clinician (PhC)/ Clinical Pharmacy Specialist

## 2019-09-09 VITALS — BP 122/68 | HR 73 | Resp 16 | Ht 62.0 in

## 2019-09-09 DIAGNOSIS — I10 Essential (primary) hypertension: Secondary | ICD-10-CM | POA: Diagnosis not present

## 2019-09-09 NOTE — Progress Notes (Signed)
     09/09/2019 [name] [birth year] [id number]   Patient is a 37 female who present with HTN is here today for a blood pressure follow up check. She has a PMH of CHF, HLD, OSA, aortic stenosis, T2DM and GERD.  Last visit on 08/19/19 patient BP recorded at 133/72. Reports from [facility] 08/31/19 says the patient is having a little SOB when talking for a long time. On 09/06/19 [facility] care reports patient BP is in the low 100s to 120s and checks home 2x/day.  Patient present today reporting that here low BP symptoms and SOB has improved. But is still having trouble talking for a long period of time on occasion. Patient is still sleeping with 3 pillows at night. Office visit BP toady was 122/68, in comparison the patient home BP monitor today read almost 20 mmhg higher (systolic) at [id number]. The patient home BP avg the last 2 weeks were 141/71 in the morning and 125/65 at night. Calculated ClCr 99 ml/min with most recent labs  PHM Obesity BMI > 30 kg/m2  CHF Torsemide 20 mg, 3 pillows  HLD LDL 106, TC 194, TG 95 on 08/26/2018  GERD Omeprazole 40 mg     Medication: torsemide 20 mg QD, Spironolactone 12.5 mg QD at noon, Hydralazine 50 mg BID, Carvedilol 12.5 mg BID,   Allergies: NKDA  Blood pressure log: Avg BP Mornings Systolic/Diastolic: [id number] (0000000 - [id number] (52-89) Avg Evenings Systolic/Diastolic: 0000000 (99991111 - 0000000 31 (45 - 76)   FH: Mom: HTN; Brother 1: HTN, all brother (4) died from lung cancer; Sister: CKD, died from Heart attack; Daughter: 3 stents, HTN; Son: HF, HTN    SH: Does not smoke or drink alcohol    Exercise:  Walks around the house and work on her garden at least 1 time a week for 30 min  Diet: Moring consist of fruits and cereal (Rice Krispies), Lunch and consist of either a bologna sandwich with cheese, fried chicken, fried fish, cheese burger, salmon cakes. Dinner consist of the same meals for lunch   Intolerance - Amlodipine, losartan, Enalapril, metformin   A/P Patient BP is  controlled, but patient is still having some shortness of breath. Patient will take Torsemide 20 mg daily and take 1 extra dose if 3 lbs is gain in 1 day or 5 lbs in 1 week. If a second dose is needed the patient with take the dose and call the office. Patient will increase level of activity working around the house from "1 day for 30 mins" to "2 days at total of 45 mins". Patient will try to replace one fried food from her diet with a healthier baked option.   Marlowe Aschoff 4th year PharmD Student Jerald Kief  I was present with student and patient for this visit and agree with above plan.  Tommy Medal PharmD CPP [facility] at [employer]

## 2019-09-09 NOTE — Patient Instructions (Addendum)
Return for a a follow up appointment with Dr. Harrell Gave on Nov 24  Your blood pressure today is 122/68  Check your blood pressure at home 3-4 times per week and keep record of the readings.  Weigh yourself every day at the same time.  If you gain 3 pounds in 1 day or 5 pounds in 1 week, take an extra dose of torsemide.  If you need more than 2 extra doses in a week, please call the office.     Continue with all your other medications   Bring all of your meds, your BP cuff and your record of home blood pressures to your next appointment.  Exercise as you're able, try to walk approximately 30 minutes per day.  Keep salt intake to a minimum, especially watch canned and prepared boxed foods.  Eat more fresh fruits and vegetables and fewer canned items.  Avoid eating in fast food restaurants.    HOW TO TAKE YOUR BLOOD PRESSURE: . Rest 5 minutes before taking your blood pressure. .  Don't smoke or drink caffeinated beverages for at least 30 minutes before. . Take your blood pressure before (not after) you eat. . Sit comfortably with your back supported and both feet on the floor (don't cross your legs). . Elevate your arm to heart level on a table or a desk. . Use the proper sized cuff. It should fit smoothly and snugly around your bare upper arm. There should be enough room to slip a fingertip under the cuff. The bottom edge of the cuff should be 1 inch above the crease of the elbow. . Ideally, take 3 measurements at one sitting and record the average.

## 2019-09-14 ENCOUNTER — Other Ambulatory Visit: Payer: Self-pay | Admitting: Emergency Medicine

## 2019-09-14 DIAGNOSIS — J4520 Mild intermittent asthma, uncomplicated: Secondary | ICD-10-CM

## 2019-09-14 MED ORDER — ALBUTEROL SULFATE (2.5 MG/3ML) 0.083% IN NEBU: 2.5000 mg | INHALATION_SOLUTION | Freq: Two times a day (BID) | RESPIRATORY_TRACT | 0 refills | Status: DC | PRN

## 2019-09-20 ENCOUNTER — Other Ambulatory Visit: Payer: Self-pay | Admitting: Internal Medicine

## 2019-09-20 DIAGNOSIS — F411 Generalized anxiety disorder: Secondary | ICD-10-CM

## 2019-09-20 MED ORDER — TRAZODONE HCL 100 MG PO TABS: 100.0000 mg | ORAL_TABLET | Freq: Every day | ORAL | 1 refills | Status: DC

## 2019-09-21 ENCOUNTER — Encounter (INDEPENDENT_AMBULATORY_CARE_PROVIDER_SITE_OTHER): Payer: Self-pay

## 2019-10-04 ENCOUNTER — Ambulatory Visit: Payer: Medicare HMO

## 2019-10-04 ENCOUNTER — Other Ambulatory Visit: Payer: Self-pay | Admitting: Internal Medicine

## 2019-10-04 ENCOUNTER — Other Ambulatory Visit: Payer: Self-pay

## 2019-10-04 DIAGNOSIS — I10 Essential (primary) hypertension: Secondary | ICD-10-CM

## 2019-10-04 DIAGNOSIS — I5032 Chronic diastolic (congestive) heart failure: Secondary | ICD-10-CM

## 2019-10-04 MED ORDER — TORSEMIDE 20 MG PO TABS: 20.0000 mg | ORAL_TABLET | Freq: Every day | ORAL | 0 refills | Status: DC

## 2019-10-04 NOTE — Patient Outreach (Signed)
[facility]) Care Management  10/04/2019  TIERA HOWMAN 08-Mar-1933 [id number]   Telephone Assessment    Outreach attempt #1 to patient. Spoke with patient who shares that she has ben feeling down and a little stressed. She voiced that her daughter in law was only 67yrs old and passed away a few days ago. Sh is sad because she knows this is hard on her son and she does not like to see him suffer. Condolences and emotional support given. She shares that this time is particularly more difficult given the COVID-19 restrictions and trying to plan a service. Patient inquiring about compression stockings and how to obtain them/ RN CM discussed with patient using [employer] OTC catalog benefit. Patient aware but also requested info on local medical supply stores for obtaining one. RN Cm provided patient with contact info for local supply stores. She denies any further RN CM needs or concerns at this time.     Plan: RN CM discussed with patient next outreach within a month. Patient gave verbal consent and in agreement with RN CM follow up timeframe. Patient aware that they may contact RN CM sooner for any issues or concerns.  [name], RN,BSN,CCM [place] Management Telephonic Care Management Coordinator Direct Phone: [phone] Toll Free: [phone] Fax: [phone]

## 2019-10-05 DIAGNOSIS — J189 Pneumonia, unspecified organism: Secondary | ICD-10-CM | POA: Diagnosis not present

## 2019-10-14 DIAGNOSIS — Z03818 Encounter for observation for suspected exposure to other biological agents ruled out: Secondary | ICD-10-CM | POA: Diagnosis not present

## 2019-10-18 ENCOUNTER — Other Ambulatory Visit: Payer: Self-pay

## 2019-10-18 ENCOUNTER — Ambulatory Visit (INDEPENDENT_AMBULATORY_CARE_PROVIDER_SITE_OTHER): Payer: Medicare HMO | Admitting: Family

## 2019-10-18 ENCOUNTER — Ambulatory Visit (INDEPENDENT_AMBULATORY_CARE_PROVIDER_SITE_OTHER): Payer: Medicare HMO

## 2019-10-18 ENCOUNTER — Ambulatory Visit (INDEPENDENT_AMBULATORY_CARE_PROVIDER_SITE_OTHER): Payer: Medicare HMO | Admitting: Family Medicine

## 2019-10-18 ENCOUNTER — Encounter: Payer: Self-pay | Admitting: Family

## 2019-10-18 VITALS — BP 126/80 | HR 68 | Wt 234.0 lb

## 2019-10-18 VITALS — BP 136/68 | HR 78 | Temp 98.1°F | Ht 62.0 in

## 2019-10-18 DIAGNOSIS — M25551 Pain in right hip: Secondary | ICD-10-CM

## 2019-10-18 DIAGNOSIS — M1611 Unilateral primary osteoarthritis, right hip: Secondary | ICD-10-CM | POA: Diagnosis not present

## 2019-10-18 MED ORDER — HYDROCODONE-ACETAMINOPHEN 5-325 MG PO TABS: 1.0000 | ORAL_TABLET | Freq: Four times a day (QID) | ORAL | 0 refills | Status: DC | PRN

## 2019-10-18 NOTE — Patient Instructions (Addendum)
Thank you for coming in today. You should hear from Home health physical therapy soon about PT.  Use a walker to get around.  Use medicine for pain as needed.     Hip Bursitis  Hip bursitis is swelling of a fluid-filled sac (bursa) in your hip joint. This swelling (inflammation) can be painful. This condition may come and go over time. What are the causes?  Injury to the hip.  Overuse of the muscles that surround the hip joint.  An earlier injury or surgery of the hip.  Arthritis or gout.  Diabetes.  Thyroid disease.  Infection.  In some cases, the cause may not be known. What are the signs or symptoms?  Mild or moderate pain in the hip area. Pain may get worse with movement.  Tenderness and swelling of the hip, especially on the outer side of the hip.  In rare cases, the bursa may become infected. This may cause: ? A fever. ? Warmth and redness in the area. Symptoms may come and go. How is this treated? This condition is treated by resting, icing, applying pressure (compression), and raising (elevating) the injured area. You may hear this called the RICE treatment. Treatment may also include:  Using crutches.  Draining fluid out of the bursa to help relieve swelling.  Giving a shot of (injecting) medicine that helps to reduce swelling (cortisone).  Other medicines if the bursa is infected. Follow these instructions at home: Managing pain, stiffness, and swelling   If told, put ice on the painful area. ? Put ice in a plastic bag. ? Place a towel between your skin and the bag. ? Leave the ice on for 20 minutes, 2-3 times a day. ? Raise (elevate) your hip above the level of your heart as much as you can without pain. To do this, try putting a pillow under your hips while you lie down. Stop if this causes pain. Activity  Return to your normal activities as told by your doctor. Ask your doctor what activities are safe for you.  Rest and protect your hip as  much as you can until you feel better. General instructions  Take over-the-counter and prescription medicines only as told by your doctor.  Wear wraps that put pressure on your hip (compression wraps) only as told by your doctor.  Do not use your hip to support your body weight until your doctor says that you can.  Use crutches as told by your doctor.  Gently rub and stretch your injured area as often as is comfortable.  Keep all follow-up visits as told by your doctor. This is important. How is this prevented?  Exercise regularly, as told by your doctor.  Warm up and stretch before being active.  Cool down and stretch after being active.  Avoid activities that bother your hip or cause pain.  Avoid sitting down for long periods at a time. Contact a doctor if:  You have a fever.  You get new symptoms.  You have trouble walking.  You have trouble doing everyday activities.  You have pain that gets worse.  You have pain that does not get better with medicine.  You get red skin on your hip area.  You get a feeling of warmth in your hip area. Get help right away if:  You cannot move your hip.  You have very bad pain. Summary  Hip bursitis is swelling of a fluid-filled sac (bursa) in your hip.  Hip bursitis can be painful.  Symptoms often come and go over time.  This condition is treated with rest, ice, compression, elevation, and medicines. This information is not intended to replace advice given to you by your health care provider. Make sure you discuss any questions you have with your health care provider. Document Released: 12/14/2010 Document Revised: 07/20/2018 Document Reviewed: 07/20/2018 Elsevier Patient Education  2020 [employer].

## 2019-10-18 NOTE — Progress Notes (Signed)
[name] is a 85 y.o. female with the following history as recorded in EpicCare:  Patient Active Problem List   Diagnosis Date Noted  . Anxiety 08/26/2018  . Seizure ([place]) 08/25/2018  . GERD (gastroesophageal reflux disease) 08/25/2018  . Chronic diastolic CHF (congestive heart failure) ([place]) 08/25/2018  . Cough 04/24/2017  . Major depressive disorder, recurrent episode with anxious distress ([place]) 02/04/2017  . Dementia arising in the senium and presenium ([place]) 06/27/2016  . Obesity, Class II, BMI 35-39.9, with comorbidity 05/15/2016  . Insomnia w/ sleep apnea 10/16/2015  . IBS (irritable bowel syndrome) 02/27/2015  . Hashimoto's thyroiditis 05/05/2014  . Adrenal incidentaloma ([place]) 11/04/2013  . Benign paroxysmal positional vertigo 07/15/2013  . Aortic stenosis, mild 07/14/2013  . OAB (overactive bladder) 02/24/2013  . GERD with stricture 12/24/2012  . Sleep-related hypoventilation 07/30/2012  . OSA (obstructive sleep apnea) 04/16/2012  . Osteopenia 04/16/2012  . Preventative health care 05/24/2011  . Vitamin D deficiency 12/04/2010  . Hyperlipidemia with target LDL less than 130 12/03/2010  . GAD (generalized anxiety disorder) 12/03/2010  . Essential hypertension 12/03/2010  . Osteoarthritis 12/03/2010    Current Outpatient Medications  Medication Sig Dispense Refill  . albuterol (PROVENTIL) (2.5 MG/3ML) 0.083% nebulizer solution Take 3 mLs (2.5 mg total) by nebulization 2 (two) times daily as needed for wheezing or shortness of breath. 75 mL 0  . Aspirin-Caffeine (BACK & BODY EXTRA STRENGTH PO) Take by mouth.    . benzonatate (TESSALON) 200 MG capsule Take 1 capsule (200 mg total) by mouth 2 (two) times daily as needed for cough. 180 capsule 0  . carvedilol (COREG) 12.5 MG tablet Take 1 tablet (12.5 mg total) by mouth 2 (two) times daily with a meal. 180 tablet 3  . chlorthalidone (HYGROTON) 25 MG tablet Take 25 mg by mouth daily.    . Cholecalciferol (VITAMIN D3) 25 MCG  (1000 UT) CAPS Take 1 capsule by mouth daily.    . Cholecalciferol 2000 units TABS Take 2 tablets (4,000 Units total) by mouth daily. 180 tablet 1  . clonazePAM (KLONOPIN) 1 MG tablet Take 1 tablet (1 mg total) by mouth 2 (two) times daily as needed. for anxiety 60 tablet 3  . fluorometholone (FML) 0.1 % ophthalmic suspension Instill 1 drop Both Eyes 4 times a day  Taper as directed    . gabapentin (NEURONTIN) 100 MG capsule Take 1 capsule (100 mg total) by mouth 2 (two) times daily. 180 capsule 1  . gabapentin (NEURONTIN) 600 MG tablet     . hydrALAZINE (APRESOLINE) 50 MG tablet Take 1 tablet (50 mg total) by mouth 4 (four) times daily. (Patient taking differently: Take 50 mg by mouth 2 (two) times daily. ) 120 tablet 5  . ipratropium-albuterol (DUONEB) 0.5-2.5 (3) MG/3ML SOLN SMARTSIG:1 Unit(s) By Mouth Twice Daily    . isosorbide mononitrate (ISMO) 20 MG tablet Take 20 mg by mouth daily.    . mirabegron ER (MYRBETRIQ) 50 MG TB24 tablet Take 1 tablet (50 mg total) by mouth daily. 90 tablet 1  . omeprazole (PRILOSEC) 40 MG capsule Take 1 capsule (40 mg total) by mouth daily. 90 capsule 1  . promethazine (PHENERGAN) 12.5 MG tablet Take 1 tablet (12.5 mg total) by mouth every 6 (six) hours as needed for nausea or vomiting. 30 tablet 0  . torsemide (DEMADEX) 20 MG tablet Take 1 tablet (20 mg total) by mouth daily. 90 tablet 0  . traZODone (DESYREL) 100 MG tablet Take 1 tablet (100 mg  total) by mouth at bedtime. 90 tablet 1  . HYDROcodone-acetaminophen (NORCO/VICODIN) 5-325 MG tablet Take 1 tablet by mouth every 6 (six) hours as needed. 7 tablet 0  . spironolactone (ALDACTONE) 25 MG tablet Take 1 tablet (25 mg total) by mouth daily. (Patient taking differently: Take 12.5 mg by mouth daily. ) 90 tablet 3   No current facility-administered medications for this visit.     Allergies: Amlodipine, Enalapril, Losartan potassium, Morphine and related, and Metformin  Past Medical History:  Diagnosis Date   . Abdominal pain   . Anemia    NOS  . Anxiety   . Back pain   . Bruises easily   . Chronic diastolic CHF (congestive heart failure) ([place])    a. 06/2013 EF 65-70%.  . Chronic headaches    HISTORY OF   . Depression   . Diabetes mellitus    type II  DIET CONTROLLED  . Diarrhea   . Hepatitis    AGE 30S  . Hyperlipidemia   . Hypertension   . Mild aortic stenosis    a. 06/2013 Echo: EF 65-70%, mod LVH with focal basal hypertrophy, very mild AS.  Marland Kitchen Osteoarthritis   . Oxygen desaturation during sleep    USES 2 LITERS BEDTIME   VIA CPAP 06/2012 [facility]   . Shortness of breath    WITH EXERTION USES 2 L O2 BEDTIME  . Sleep apnea    CPAP WITH O2 2 LITERS 2013 ([facility])  . Wears dentures   . Wears glasses     Past Surgical History:  Procedure Laterality Date  . ABDOMINAL HYSTERECTOMY    . APPENDECTOMY    . CHOLECYSTECTOMY    . JOINT REPLACEMENT     LEFT KNEE   . KNEE ARTHROPLASTY Right 03/22/2015   Procedure: COMPUTER ASSISTED TOTAL KNEE ARTHROPLASTY;  Surgeon: Marybelle Killings, MD;  Location: [place];  Service: Orthopedics;  Laterality: Right;  . KNEE ARTHROSCOPY     RIGHT  . LEFT AND RIGHT HEART CATHETERIZATION WITH CORONARY ANGIOGRAM N/A 10/28/2013   Procedure: LEFT AND RIGHT HEART CATHETERIZATION WITH CORONARY ANGIOGRAM;  Surgeon: Blane Ohara, MD;  Location: [facility] CATH LAB;  Service: Cardiovascular;  Laterality: N/A;  . LEFT HEART CATHETERIZATION WITH CORONARY ANGIOGRAM N/A 11/14/2014   Procedure: LEFT HEART CATHETERIZATION WITH CORONARY ANGIOGRAM;  Surgeon: Josue Hector, MD;  Location: [facility] C[facility];  Service: Cardiovascular;  Laterality: N/A;  . REVISION TOTAL KNEE ARTHROPLASTY  2011  . ROTATOR CUFF REPAIR  2010  . SHOULDER ARTHROSCOPY  09/17/2012   Procedure: ARTHROSCOPY SHOULDER;  Surgeon: Sharmon Revere, MD;  Location: [place];  Service: Orthopedics;  Laterality: Left;  . TONSILLECTOMY    . TONSILLECTOMY      Family History  Problem Relation Age of Onset  . Cancer Father         prostate cancer  . Heart disease Sister   . Heart disease Brother   . Alcohol abuse Other   . Hypertension Other   . Kidney disease Other   . Mental illness Other   . Emphysema Brother     Social History   Tobacco Use  . Smoking status: Never Smoker  . Smokeless tobacco: Never Used  Substance Use Topics  . Alcohol use: No    Subjective:  Patient is accompanied by her daughter today; complaining of pain in right hip- feels like she just can't bear weight on the hip due to the severity of the pain; no numbness  or tingling; when walking, "it feels like it could give out." No recent fall; was very active yesterday- able to drive herself to the grocery store and got home last night with no difficulty; requesting x-rays today;      Objective:  Vitals:   10/18/19 1347  BP: 136/68  Pulse: 78  Temp: 98.1 F (36.7 C)  TempSrc: Oral  SpO2: 96%  Height: 5\' 2"  (1.575 m)    General: Well developed, well nourished, in no acute distress  Skin : Warm and dry.  Head: Normocephalic and atraumatic  Lungs: Respirations unlabored;  Musculoskeletal: No deformities; no active joint inflammation; limited exam as patient is seen and opts to remain in wheelchair;  Neurologic: Alert and oriented; speech intact; face symmetrical;  Assessment:  1. Right hip pain     Plan:  Suspect patient needs injection; she is able to be seen by one of our sports medicine providers today- will let him determine necessary imaging and treatment; follow-up with her PCP as needed otherwise.   No follow-ups on file.  Orders Placed This Encounter  Procedures  . DG Hip Unilat W OR W/O Pelvis 1V Right    Standing Status:   Future    Standing Expiration Date:   12/17/2020    Order Specific Question:   Reason for Exam (SYMPTOM  OR DIAGNOSIS REQUIRED)    Answer:   right hip pain    Order Specific Question:   Preferred imaging location?    Answer:   [facility]    Order Specific Question:   Radiology  Contrast Protocol - do NOT remove file path    Answer:   [email or web address]    Requested Prescriptions    No prescriptions requested or ordered in this encounter

## 2019-10-18 NOTE — Progress Notes (Signed)
Subjective:    I'm seeing this patient as a consultation for:  Jodi Mourning FNP  CC: Buttocks pain.  HPI:  Patient is here for right hip pain. Patient states that last night she woke up at 2am feeling as if she could not walk to the restroom. Pain in right glute. Has a hard time walking due to pain.  She cannot recall any specific injury.  She did a little more walking than usual the day before she started hurting.  She notes that in the grocery store yesterday she was having a little bit of pain in her right buttocks but not much at all.  She notes pain is quite severe especially with weightbearing located predominantly into her right lateral hip and buttocks.  She denies any back pain or pain radiating below the level of the knee.  No fevers chills nausea vomiting or diarrhea.  Past medical history, Surgical history, Family history not pertinant except as noted below, Social history, Allergies, and medications have been entered into the medical record, reviewed, and no changes needed.   Review of Systems: No headache, visual changes, nausea, vomiting, diarrhea, constipation, dizziness, abdominal pain, skin rash, fevers, chills, night sweats, weight loss, swollen lymph nodes, body aches, joint swelling, muscle aches, chest pain, shortness of breath, mood changes, visual or auditory hallucinations.   Objective:    Vitals:   10/18/19 1442  BP: 126/80  Pulse: 68  SpO2: 92%   General: Well Developed, well nourished, and in no acute distress.  Neuro/Psych: Alert and oriented x3, extra-ocular muscles intact, able to move all 4 extremities, sensation grossly intact. Skin: Warm and dry, no rashes noted.  Respiratory: Not using accessory muscles, speaking in full sentences, trachea midline.  Cardiovascular: Pulses palpable, no extremity edema. Abdomen: Does not appear distended. MSK:  L-spine: Nontender to spinal midline.  Nontender paraspinal musculature. Right hip: Normal-appearing tender  palpation buttocks and greater trochanter.  Hip range of motion normal rotation some guarding with abduction.  Exam limited by guarding. Strength: Significant limitation in hip abduction strength.  Patient in quite a bit of pain unable to complete adequate strength testing. Significant pain with weightbearing however able to transfer from wheelchair to exam table and back. Cap refill sensation intact distally.   Lab and Radiology Results No results found for this or any previous visit (from the past 72 hour(s)). Dg Hip Unilat W Or W/o Pelvis 2-3 Views Right  Result Date: 10/18/2019 CLINICAL DATA:  Right hip pain. EXAM: DG HIP (WITH OR WITHOUT PELVIS) 2-3V RIGHT COMPARISON:  August 25, 2018. FINDINGS: There is no definite evidence of hip fracture or dislocation. Mild joint space narrowing is noted with a large amount osteophyte formation suggesting degenerative joint disease. IMPRESSION: Mild degenerative joint disease is noted in the right hip. No fracture or dislocation is noted. Electronically Signed   By: Marijo Conception M.D.   On: 10/18/2019 15:20  I, Lynne Leader, personally (independently) visualized and performed the interpretation of the images attached in this note.   Hip greater trochanteric injection: right Consent obtained and timeout performed. Area of maximum tenderness palpated and identified. Skin cleaned with alcohol, cold spray applied. A spinal needle was used to access the greater trochanteric bursa. 40 mg of Kenalog and 3 mL of Marcaine were used to inject the trochanteric bursa. Patient tolerated the procedure well.     Impression and Recommendations:    Assessment and Plan: 85 y.o. female with right lateral and posterior hip pain.  Very likely strain and spasm of gluteus medius tendon.  X-ray does not reveal fracture.  Discussed treatment options.  Plan for steroid injection as above as well as management with limited hydrocodone.  Additional referral home with  physical therapy and recheck in about 2 weeks.  Precautions reviewed.Marland Kitchen  PDMP reviewed during this encounter. Orders Placed This Encounter  Procedures  . DG Hip Unilat W OR W/O Pelvis 2-3 Views Right    Standing Status:   Future    Number of Occurrences:   1    Standing Expiration Date:   12/17/2020    Order Specific Question:   Reason for Exam (SYMPTOM  OR DIAGNOSIS REQUIRED)    Answer:   right hip and buttock pain    Order Specific Question:   Preferred imaging location?    Answer:   [facility]    Order Specific Question:   Radiology Contrast Protocol - do NOT remove file path    Answer:   [email or web address]  . Ambulatory referral to [facility] PT    Referral Priority:   Routine    Referral Type:   [place]    Referral Reason:   Specialty Services Required    Requested Specialty:   [place]    Number of Visits Requested:   1   Meds ordered this encounter  Medications  . HYDROcodone-acetaminophen (NORCO/VICODIN) 5-325 MG tablet    Sig: Take 1 tablet by mouth every 6 (six) hours as needed.    Dispense:  7 tablet    Refill:  0    Discussed warning signs or symptoms. Please see discharge instructions. Patient expresses understanding.   The above documentation has been reviewed and is accurate and complete Lynne Leader

## 2019-10-19 ENCOUNTER — Ambulatory Visit: Payer: Medicare HMO | Admitting: Cardiology

## 2019-10-19 ENCOUNTER — Other Ambulatory Visit: Payer: Self-pay | Admitting: Internal Medicine

## 2019-10-19 DIAGNOSIS — J4520 Mild intermittent asthma, uncomplicated: Secondary | ICD-10-CM

## 2019-10-19 MED ORDER — ALBUTEROL SULFATE (2.5 MG/3ML) 0.083% IN NEBU: 2.5000 mg | INHALATION_SOLUTION | Freq: Two times a day (BID) | RESPIRATORY_TRACT | 0 refills | Status: DC | PRN

## 2019-10-19 MED ORDER — ALBUTEROL SULFATE (2.5 MG/3ML) 0.083% IN NEBU: 2.5000 mg | INHALATION_SOLUTION | Freq: Two times a day (BID) | RESPIRATORY_TRACT | 5 refills | Status: DC | PRN

## 2019-10-19 NOTE — Progress Notes (Signed)
X-ray hip shows arthritis like we discussed but no fractures visible.

## 2019-10-20 ENCOUNTER — Telehealth: Payer: Self-pay | Admitting: Internal Medicine

## 2019-10-20 DIAGNOSIS — M25551 Pain in right hip: Secondary | ICD-10-CM | POA: Diagnosis not present

## 2019-10-20 DIAGNOSIS — I10 Essential (primary) hypertension: Secondary | ICD-10-CM | POA: Diagnosis not present

## 2019-10-20 DIAGNOSIS — R262 Difficulty in walking, not elsewhere classified: Secondary | ICD-10-CM | POA: Diagnosis not present

## 2019-10-20 DIAGNOSIS — M1611 Unilateral primary osteoarthritis, right hip: Secondary | ICD-10-CM | POA: Diagnosis not present

## 2019-10-20 NOTE — Telephone Encounter (Signed)
See note

## 2019-10-20 NOTE — Telephone Encounter (Signed)
[place] [facility]   Caller/Agency Clydene Laming Number: [phone] Requesting OT/PT/Skilled Nursing/Social Work/Speech Therapy: Physical Therapy  Frequency: 2x3

## 2019-10-25 DIAGNOSIS — R262 Difficulty in walking, not elsewhere classified: Secondary | ICD-10-CM | POA: Diagnosis not present

## 2019-10-25 DIAGNOSIS — M1611 Unilateral primary osteoarthritis, right hip: Secondary | ICD-10-CM | POA: Diagnosis not present

## 2019-10-25 DIAGNOSIS — M25551 Pain in right hip: Secondary | ICD-10-CM | POA: Diagnosis not present

## 2019-10-25 DIAGNOSIS — I10 Essential (primary) hypertension: Secondary | ICD-10-CM | POA: Diagnosis not present

## 2019-10-25 NOTE — Telephone Encounter (Signed)
Ok to proceed with verbal order

## 2019-10-25 NOTE — Telephone Encounter (Signed)
Called and authorized verbal order.

## 2019-10-27 IMAGING — DX DG ABDOMEN 2V
2 series · 2 of 2 positions shown · non-contrast
Comparison: CT 05/20/2014

CLINICAL DATA: Nausea, constipation.

EXAM:
ABDOMEN - 2 VIEW

[abdomen erect]
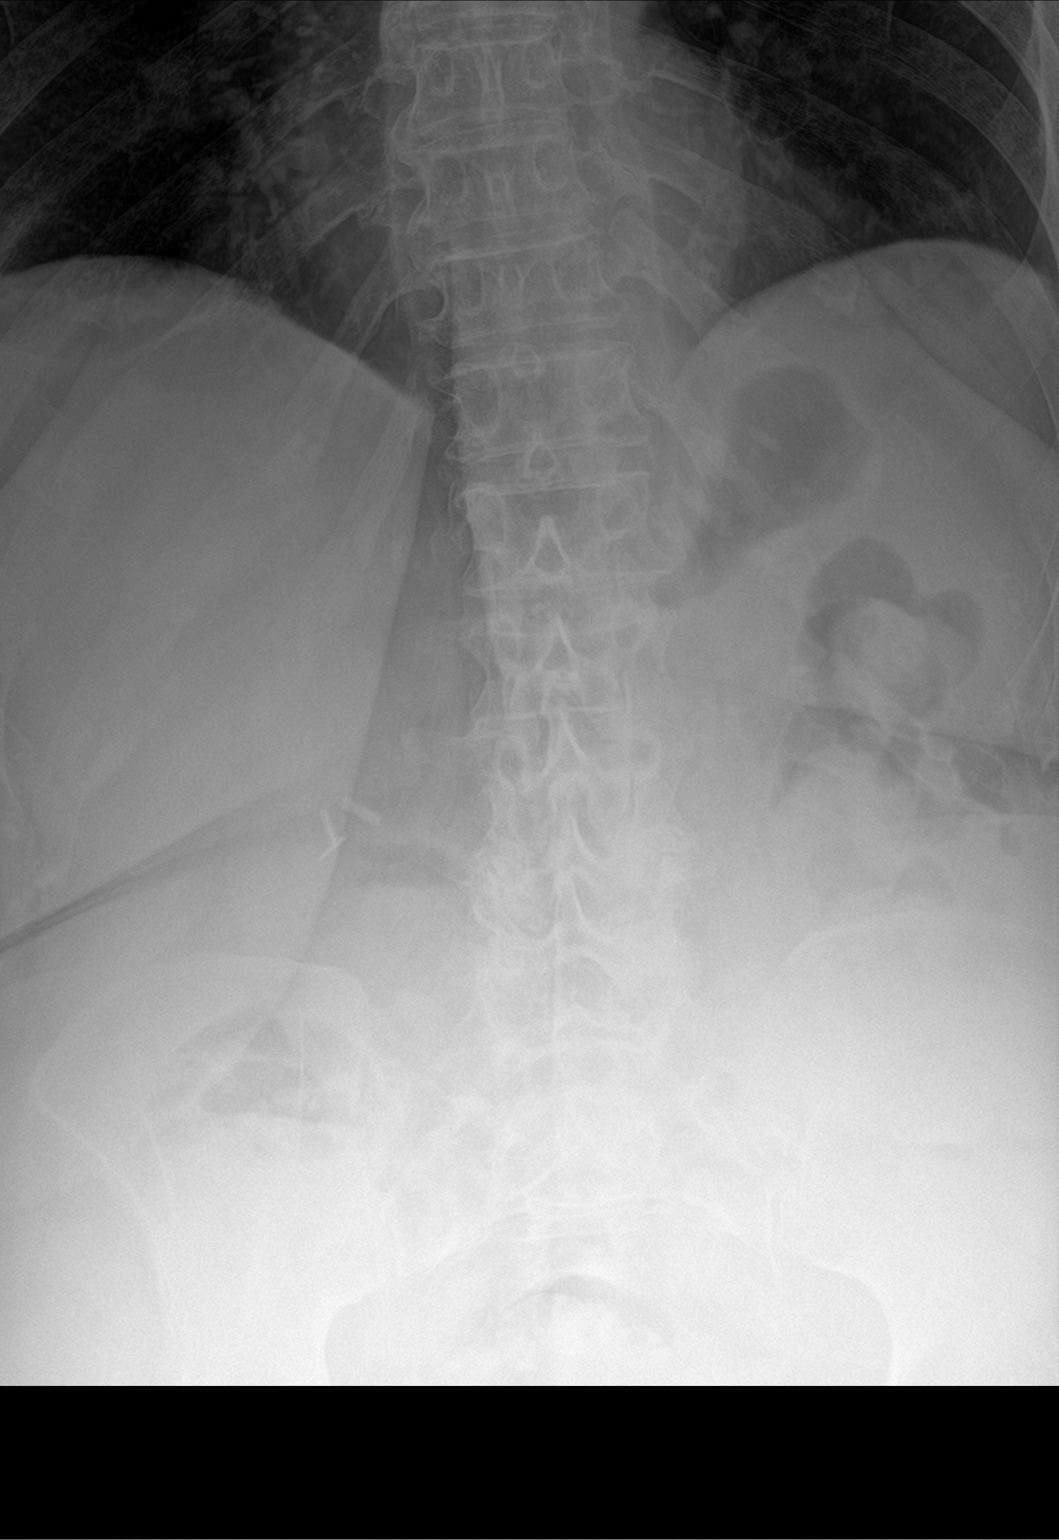

[abdomen supine]
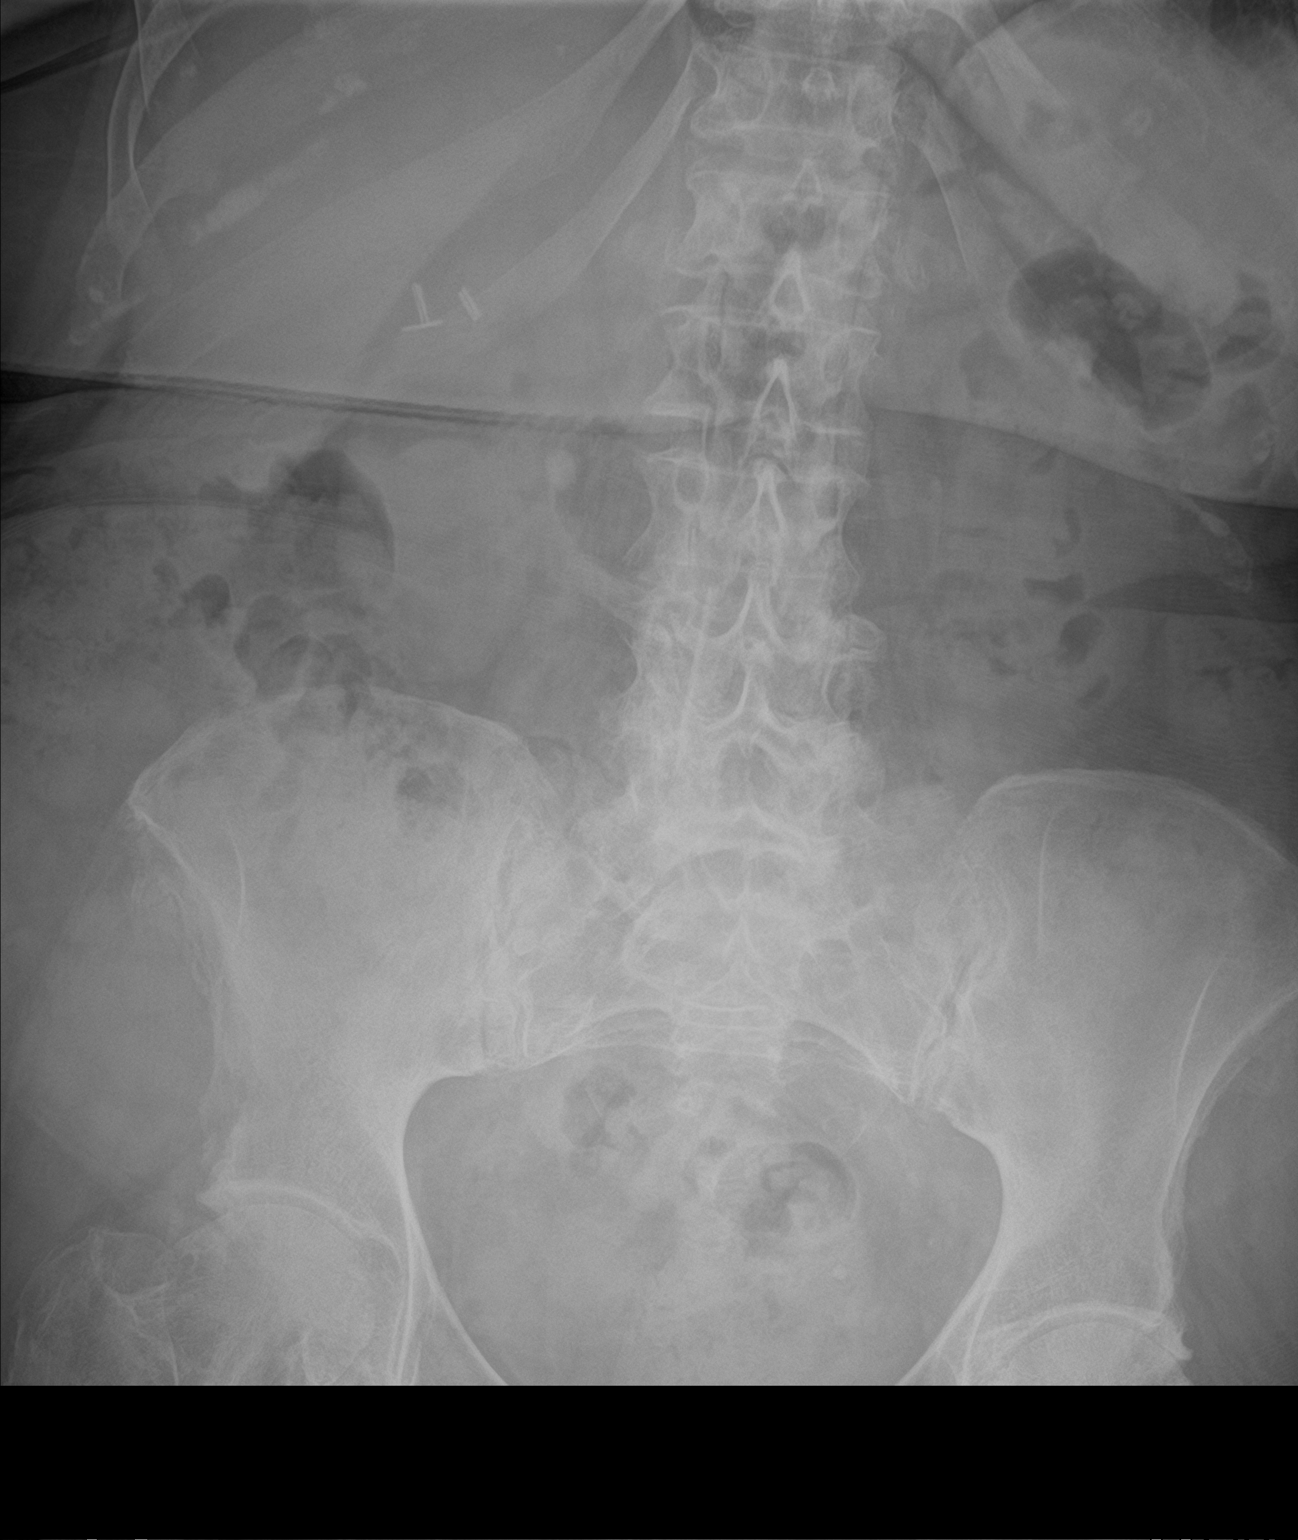

[2 of 2 positions shown; findings below may reference images not displayed]

FINDINGS: Prior cholecystectomy. Moderate stool burden throughout the colon.
Nonobstructive bowel gas pattern. No free air organomegaly.
IMPRESSION: Prior cholecystectomy.

Moderate stool burden throughout the colon.

No acute findings.

## 2019-10-28 DIAGNOSIS — R262 Difficulty in walking, not elsewhere classified: Secondary | ICD-10-CM | POA: Diagnosis not present

## 2019-10-28 DIAGNOSIS — M25551 Pain in right hip: Secondary | ICD-10-CM | POA: Diagnosis not present

## 2019-10-28 DIAGNOSIS — I10 Essential (primary) hypertension: Secondary | ICD-10-CM | POA: Diagnosis not present

## 2019-10-28 DIAGNOSIS — M1611 Unilateral primary osteoarthritis, right hip: Secondary | ICD-10-CM | POA: Diagnosis not present

## 2019-10-29 ENCOUNTER — Other Ambulatory Visit: Payer: Self-pay

## 2019-10-29 NOTE — Patient Outreach (Signed)
[facility]) Care Management  10/29/2019  Lisa MOREE [birth year] [id number]   Telephone Assessment   Message received from front office staff that patient called and requested RN CM contact her. Outreach attempt to patient. Spoke with patient.She voices she was looking at her printout of appts and realized RNCM was to call her on same day and around time as cardiology appt. She requested that call be completed today. He denies any acute issues or concerns at present. She states she has been doing and feeling pretty well. She had recent PCP appt. Patient reports she discussed arthritis and hip pain with MD. Mariana Kaufman has been ordered HHPT and they are coming 2x week to work with her. Patient reports she is actively participating with them and feels like sessions are helping her. She denies any RN CM needs or concerns at this time.   THN CM Care Plan Problem One     Most Recent Value  Care Plan Problem One  -- [Patient at risk for falls as evidenced by impaired mobility]  Role Documenting the Problem One  Care Management Telephonic [place] for Problem One  Active  [facility] Long Term Goal   Patient will report no falls over the next 60 days.   THN Long Term Goal Start Date  08/31/19  Interventions for Problem One Long Term Goal  RN CM assessed for falls/injuries. RN CM reinfored fall/safety measures with pt. RN CM discussed HHPT benefits.  THN CM Short Term Goal #1   Patient will report adherence to using DME in the home 100% of the time over the next 30 days.  THN CM Short Term Goal #1 Start Date  08/31/19    [facility] CM Care Plan Problem Two     Most Recent Value  Care Plan Problem Two  Knowledge deficit related to mgmt of HF.  Role Documenting the Problem Two  Care Management Telephonic [place] for Problem Two  Active  Interventions for Problem Two Long Term Goal   RN CM assessed for acute sxs/issues. RN CM reviewed s/s of worsening condition and when to  seek medical attention. RN CM confirmed pt. knows how,when and why to contact MDs  [place] Term Goal  patient will have no exacerbations and hospital admissions related to HF over the next 60 days.  THN CM Short Term Goal #1   Patient will report monitoring and recording BP and weight daily in the home over the next 30 days.  THN CM Short Term Goal #1 Start Date  09/06/19  [facility] CM Short Term Goal #1 Met Date   09/06/19  Interventions for Short Term Goal #2   RN CM reinforced importance of home monitoring and alerting MD of abnormal results.  THN CM Short Term Goal #2   Patient will report adherence to med regimen including diuretics over the next 30 days.  THN CM Short Term Goal #2 Start Date  09/06/19  [facility] CM Short Term Goal #2 Met Date  10/29/19    [facility] CM Care Plan Problem Three     Most Recent Value  Care Plan Problem Three  Patient with deconditioning-getting HHPT.  Role Documenting the Problem Three  Care Management Telephonic Coordinator  Care Plan for Problem Three  Active  THN Long Term Goal   Patient will complete HHPT therapy over the next 90 days.  THN Long Term Goal Start Date  10/29/19  Interventions for Problem Three Long Term Goal  RN CM confirmed services are in place and discussed pt's progress.  THN CM Short Term Goal #1   Patient will report/verbalize increase strength/mobility in legs over the next 30 days.   THN CM Short Term Goal #1 Start Date  10/29/19  Interventions for Short Term Goal #1  RN CM assessed for pain and other barriers to participating with HHPT. RN CM encouraged use of DME as needed.        Plan: RN CM discussed with patient next outreach within the month of January. Patient gave verbal consent and in agreement with RN CM follow up and  timeframe. Patient aware that they may contact RN CM sooner for any issues or concerns. RN CM will send quarterly update to PCP.   [name], RN,BSN,CCM [place] Management Telephonic Care Management  Coordinator Direct Phone: [phone] Toll Free: [phone] Fax: [phone]

## 2019-11-01 ENCOUNTER — Other Ambulatory Visit: Payer: Self-pay

## 2019-11-02 ENCOUNTER — Ambulatory Visit: Payer: Medicare HMO

## 2019-11-02 ENCOUNTER — Encounter: Payer: Self-pay | Admitting: Family Medicine

## 2019-11-02 ENCOUNTER — Ambulatory Visit (INDEPENDENT_AMBULATORY_CARE_PROVIDER_SITE_OTHER): Payer: Medicare HMO | Admitting: Family Medicine

## 2019-11-02 VITALS — BP 102/62 | HR 69 | Ht 62.0 in | Wt 222.6 lb

## 2019-11-02 DIAGNOSIS — M7061 Trochanteric bursitis, right hip: Secondary | ICD-10-CM | POA: Diagnosis not present

## 2019-11-02 NOTE — Patient Instructions (Signed)
Thank you for coming in today. Recheck with me as needed.  Continue the home exercises and home PT.  Recheck with me as needed.  I will be in the [address] [facility] building.

## 2019-11-02 NOTE — Progress Notes (Signed)
I, Lisa [name], LAT, ATC, am serving as scribe for Lisa [name].  Lisa [name] is a 85 y.o. [name] who presents to [place] today for f/u of R hip/glute pain.  She was last seen by Lisa [name] on 10/18/19 and had a R hip XR and a 3/1 Kenalog 40 greater trochanter injection.  She was also referred to home health PT and was prescribed Norco 5/325 mg.  Since her last visit, pt notes that she is feeling "much better."  She has been seeing home health PT.  She notes almost 90% improvement but also states that she is still having "moderate" pain.  She is walking w/ a RW.     ROS:  As above  Exam:  BP 102/Lisa (BP Location: Left Arm, Patient Position: Sitting, Cuff Size: Large)   Pulse 69   Ht 5\' 2"  (1.575 m)   Wt 222 lb 9.6 oz (101 kg)   SpO2 95%   BMI 40.71 kg/m  Wt Readings from Last 5 Encounters:  11/02/19 222 lb 9.6 oz (101 kg)  10/18/19 234 lb (106.1 kg)  08/19/19 234 lb (106.1 kg)  07/16/19 231 lb 3.2 oz (104.9 kg)  07/13/19 232 lb (105.2 kg)   General: Well Developed, well nourished, and in no acute distress.  Neuro/Psych: Alert and oriented x3, extra-ocular muscles intact, able to move all 4 extremities, sensation grossly intact. Skin: Warm and dry, no rashes noted.  Respiratory: Not using accessory muscles, speaking in full sentences, trachea midline.  Cardiovascular: Pulses palpable, no extremity edema. Abdomen: Does not appear distended. MSK:  Hips nontender bilaterally.  Normal motion.  Able to stand using walker and ambulate smoothly without pain.    Lab and Radiology Results Dg Hip Unilat W Or W/o Pelvis 2-3 Views Right  Result Date: 10/18/2019 CLINICAL DATA:  Right hip pain. EXAM: DG HIP (WITH OR WITHOUT PELVIS) 2-3V RIGHT COMPARISON:  August 25, 2018. FINDINGS: There is no definite evidence of hip fracture or dislocation. Mild joint space narrowing is noted with a large amount osteophyte formation suggesting degenerative joint disease. IMPRESSION:  Mild degenerative joint disease is noted in the right hip. No fracture or dislocation is noted. Electronically Signed   By: Lisa [name] M.D.   On: 10/18/2019 15:20   I, Lisa [name], personally (independently) visualized and performed the interpretation of the images attached in this note.     Assessment and Plan: 85 y.o. [name] with hip pain follow-up.  Pain due to hip abductor tendinopathy/trochanteric bursitis.  Significant reduction in pain at lateral hip following steroid injection and home health physical therapy.  Plan to continue home with physical therapy and home exercises.  Recheck back with me as needed for this issue or other issues in the future.  Precautions reviewed.  I spent 15 minutes with this patient, greater than 50% was face-to-face time counseling regarding treatment plan, next steps, and backup plan.Marland Kitchen     Historical information moved to improve visibility of documentation.  Past Medical History:  Diagnosis Date  . Abdominal pain   . Anemia    NOS  . Anxiety   . Back pain   . Bruises easily   . Chronic diastolic CHF (congestive heart failure) ([place])    a. 06/2013 EF 65-70%.  . Chronic headaches    HISTORY OF   . Depression   . Diabetes mellitus    type II  DIET CONTROLLED  . Diarrhea   . Hepatitis    AGE  32S  . Hyperlipidemia   . Hypertension   . Mild aortic stenosis    a. 06/2013 Echo: EF 65-70%, mod LVH with focal basal hypertrophy, very mild AS.  Marland Kitchen Osteoarthritis   . Oxygen desaturation during sleep    USES 2 LITERS BEDTIME   VIA CPAP 06/2012 [facility]   . Shortness of breath    WITH EXERTION USES 2 L O2 BEDTIME  . Sleep apnea    CPAP WITH O2 2 LITERS 2013 ([facility])  . Wears dentures   . Wears glasses    Past Surgical History:  Procedure Laterality Date  . ABDOMINAL HYSTERECTOMY    . APPENDECTOMY    . CHOLECYSTECTOMY    . JOINT REPLACEMENT     LEFT KNEE   . KNEE ARTHROPLASTY Right 03/22/2015   Procedure: COMPUTER ASSISTED TOTAL KNEE  ARTHROPLASTY;  Surgeon: Lisa Killings, MD;  Location: [facility];  Service: Orthopedics;  Laterality: Right;  . KNEE ARTHROSCOPY     RIGHT  . LEFT AND RIGHT HEART CATHETERIZATION WITH CORONARY ANGIOGRAM N/A 10/28/2013   Procedure: LEFT AND RIGHT HEART CATHETERIZATION WITH CORONARY ANGIOGRAM;  Surgeon: Blane Ohara, MD;  Location: [facility] CATH LAB;  Service: Cardiovascular;  Laterality: N/A;  . LEFT HEART CATHETERIZATION WITH CORONARY ANGIOGRAM N/A 11/14/2014   Procedure: LEFT HEART CATHETERIZATION WITH CORONARY ANGIOGRAM;  Surgeon: Josue Hector, MD;  Location: [facility] CATH LAB;  Service: Cardiovascular;  Laterality: N/A;  . REVISION TOTAL KNEE ARTHROPLASTY  2011  . ROTATOR CUFF REPAIR  2010  . SHOULDER ARTHROSCOPY  09/17/2012   Procedure: ARTHROSCOPY SHOULDER;  Surgeon: Sharmon Revere, MD;  Location: [place];  Service: Orthopedics;  Laterality: Left;  . TONSILLECTOMY    . TONSILLECTOMY     Social History   Tobacco Use  . Smoking status: Never Smoker  . Smokeless tobacco: Never Used  Substance Use Topics  . Alcohol use: No   family history includes Alcohol abuse in an other family member; Cancer in her father; Emphysema in her brother; Heart disease in her brother and sister; Hypertension in an other family member; Kidney disease in an other family member; Mental illness in an other family member.  Medications: Current Outpatient Medications  Medication Sig Dispense Refill  . albuterol (PROVENTIL) (2.5 MG/3ML) 0.083% nebulizer solution Take 3 mLs (2.5 mg total) by nebulization 2 (two) times daily as needed for wheezing or shortness of breath. 75 mL 5  . benzonatate (TESSALON) 200 MG capsule Take 1 capsule (200 mg total) by mouth 2 (two) times daily as needed for cough. 180 capsule 0  . carvedilol (COREG) 12.5 MG tablet Take 1 tablet (12.5 mg total) by mouth 2 (two) times daily with a meal. 180 tablet 3  . chlorthalidone (HYGROTON) 25 MG tablet Take 25 mg by mouth daily.    . Cholecalciferol  (VITAMIN D3) 25 MCG (1000 UT) CAPS Take 1 capsule by mouth daily.    . Cholecalciferol 2000 units TABS Take 2 tablets (4,000 Units total) by mouth daily. 180 tablet 1  . clonazePAM (KLONOPIN) 1 MG tablet Take 1 tablet (1 mg total) by mouth 2 (two) times daily as needed. for anxiety 60 tablet 3  . fluorometholone (FML) 0.1 % ophthalmic suspension Instill 1 drop Both Eyes 4 times a day  Taper as directed    . gabapentin (NEURONTIN) 100 MG capsule Take 1 capsule (100 mg total) by mouth 2 (two) times daily. 180 capsule 1  . gabapentin (NEURONTIN) 600 MG tablet     .  hydrALAZINE (APRESOLINE) 50 MG tablet Take 1 tablet (50 mg total) by mouth 4 (four) times daily. (Patient taking differently: Take 50 mg by mouth 2 (two) times daily. ) 120 tablet 5  . HYDROcodone-acetaminophen (NORCO/VICODIN) 5-325 MG tablet Take 1 tablet by mouth every 6 (six) hours as needed. 7 tablet 0  . ipratropium-albuterol (DUONEB) 0.5-2.5 (3) MG/3ML SOLN SMARTSIG:1 Unit(s) By Mouth Twice Daily    . isosorbide mononitrate (ISMO) 20 MG tablet Take 20 mg by mouth daily.    . mirabegron ER (MYRBETRIQ) 50 MG TB24 tablet Take 1 tablet (50 mg total) by mouth daily. 90 tablet 1  . omeprazole (PRILOSEC) 40 MG capsule Take 1 capsule (40 mg total) by mouth daily. 90 capsule 1  . promethazine (PHENERGAN) 12.5 MG tablet Take 1 tablet (12.5 mg total) by mouth every 6 (six) hours as needed for nausea or vomiting. 30 tablet 0  . torsemide (DEMADEX) 20 MG tablet Take 1 tablet (20 mg total) by mouth daily. 90 tablet 0  . traZODone (DESYREL) 100 MG tablet Take 1 tablet (100 mg total) by mouth at bedtime. 90 tablet 1  . spironolactone (ALDACTONE) 25 MG tablet Take 1 tablet (25 mg total) by mouth daily. (Patient taking differently: Take 12.5 mg by mouth daily. ) 90 tablet 3   No current facility-administered medications for this visit.    Allergies  Allergen Reactions  . Amlodipine Other (See Comments)    Reaction:  Dizziness   . Enalapril Cough   . Losartan Potassium Other (See Comments)    Reaction:  GI pain   . Morphine And Related Itching and Nausea And Vomiting  . Metformin Diarrhea      Discussed warning signs or symptoms. Please see discharge instructions. Patient expresses understanding.  The above documentation has been reviewed and is accurate and complete Lisa [name]

## 2019-11-03 ENCOUNTER — Ambulatory Visit (INDEPENDENT_AMBULATORY_CARE_PROVIDER_SITE_OTHER): Payer: Medicare HMO | Admitting: Cardiology

## 2019-11-03 ENCOUNTER — Other Ambulatory Visit: Payer: Self-pay

## 2019-11-03 ENCOUNTER — Encounter: Payer: Self-pay | Admitting: Cardiology

## 2019-11-03 VITALS — BP 154/68 | HR 63 | Ht 62.0 in | Wt 222.1 lb

## 2019-11-03 DIAGNOSIS — I110 Hypertensive heart disease with heart failure: Secondary | ICD-10-CM

## 2019-11-03 DIAGNOSIS — I5032 Chronic diastolic (congestive) heart failure: Secondary | ICD-10-CM | POA: Diagnosis not present

## 2019-11-03 DIAGNOSIS — R011 Cardiac murmur, unspecified: Secondary | ICD-10-CM

## 2019-11-03 DIAGNOSIS — Z7189 Other specified counseling: Secondary | ICD-10-CM

## 2019-11-03 DIAGNOSIS — I700 Atherosclerosis of aorta: Secondary | ICD-10-CM

## 2019-11-03 DIAGNOSIS — I10 Essential (primary) hypertension: Secondary | ICD-10-CM

## 2019-11-03 NOTE — Patient Instructions (Addendum)
Medication Instructions:  Torsemide is main fluid pill. Spironolactone and chlorthalidone are helpers but not the main fluid pills.  *If you need a refill on your cardiac medications before your next appointment, please call your pharmacy*  Lab Work: None  Testing/Procedures: None  Follow-Up: At [facility], you and your health needs are our priority.  As part of our continuing mission to provide you with exceptional heart care, we have created designated Provider Care Teams.  These Care Teams include your primary Cardiologist (physician) and Advanced Practice Providers (APPs -  Physician Assistants and Nurse Practitioners) who all work together to provide you with the care you need, when you need it.  Your next appointment:   6 month(s)  The format for your next appointment:   In Person  Provider:   [name], MD

## 2019-11-03 NOTE — Progress Notes (Signed)
Cardiology Office Note:    Date:  11/03/2019   ID:  [name], DOB 1933-07-10, MRN [id number]  PCP:  Lisa Lima, MD  Cardiologist:  Lisa Dresser, MD PhD  Referring MD: Lisa Lima, MD   CC: follow up  History of Present Illness:    [name] is a 85 y.o. female with a hx of hypertension who is seen for follow up today. I initially met her 07/16/19 as a new consult at the request of Lisa Lima, MD for the evaluation and management of aortic stenosis and concern for diastolic heart failure.  Today: Here with daughter today. Breathing much improved on current regimen. Weight has been slowly going down, average around 220 lbs, up a bit with Thanksgiving. BP elevated today, which is not normal for her. BP yesterday was 102/62 in doctors office. BP at home is in the 707E systolic.  We spent extensive time today reviewing diastolic heart failure (vs. Systolic), full cardiac medication reconciliation including what medications do, and heart failure education.  Denies chest pain, shortness of breath at rest or with normal exertion. No PND, orthopnea, LE edema or unexpected weight gain. No syncope or palpitations.  Past Medical History:  Diagnosis Date  . Abdominal pain   . Anemia    NOS  . Anxiety   . Back pain   . Bruises easily   . Chronic diastolic CHF (congestive heart failure) ([place])    a. 06/2013 EF 65-70%.  . Chronic headaches    HISTORY OF   . Depression   . Diabetes mellitus    type II  DIET CONTROLLED  . Diarrhea   . Hepatitis    AGE 30S  . Hyperlipidemia   . Hypertension   . Mild aortic stenosis    a. 06/2013 Echo: EF 65-70%, mod LVH with focal basal hypertrophy, very mild AS.  [name] Osteoarthritis   . Oxygen desaturation during sleep    USES 2 LITERS BEDTIME   VIA CPAP 06/2012 [facility]   . Shortness of breath    WITH EXERTION USES 2 L O2 BEDTIME  . Sleep apnea    CPAP WITH O2 2 LITERS 2013 ([facility])  . Wears dentures   . Wears glasses      Past Surgical History:  Procedure Laterality Date  . ABDOMINAL HYSTERECTOMY    . APPENDECTOMY    . CHOLECYSTECTOMY    . JOINT REPLACEMENT     LEFT KNEE   . KNEE ARTHROPLASTY Right 03/22/2015   Procedure: COMPUTER ASSISTED TOTAL KNEE ARTHROPLASTY;  Surgeon: Lisa Killings, MD;  Location: [facility];  Service: Orthopedics;  Laterality: Right;  . KNEE ARTHROSCOPY     RIGHT  . LEFT AND RIGHT HEART CATHETERIZATION WITH CORONARY ANGIOGRAM N/A 10/28/2013   Procedure: LEFT AND RIGHT HEART CATHETERIZATION WITH CORONARY ANGIOGRAM;  Surgeon: Lisa Ohara, MD;  [facility];  Service: Cardiovascular;  Laterality: N/A;  . LEFT HEART CATHETERIZATION WITH CORONARY ANGIOGRAM N/A 11/14/2014   Procedure: LEFT HEART CATHETERIZATION WITH CORONARY ANGIOGRAM;  Surgeon: Lisa Hector, MD;  Location: [facility] CATH LAB;  Service: Cardiovascular;  Laterality: N/A;  . REVISION TOTAL KNEE ARTHROPLASTY  2011  . ROTATOR CUFF REPAIR  2010  . SHOULDER ARTHROSCOPY  09/17/2012   Procedure: ARTHROSCOPY SHOULDER;  Surgeon: Lisa Revere, MD;  Location: [place];  Service: Orthopedics;  Laterality: Left;  . TONSILLECTOMY    . TONSILLECTOMY      Current Medications: Current Outpatient  Medications on File Prior to Visit  Medication Sig  . albuterol (PROVENTIL) (2.5 MG/3ML) 0.083% nebulizer solution Take 3 mLs (2.5 mg total) by nebulization 2 (two) times daily as needed for wheezing or shortness of breath.  . benzonatate (TESSALON) 200 MG capsule Take 1 capsule (200 mg total) by mouth 2 (two) times daily as needed for cough.  . carvedilol (COREG) 12.5 MG tablet Take 1 tablet (12.5 mg total) by mouth 2 (two) times daily with a meal.  . chlorthalidone (HYGROTON) 25 MG tablet Take 25 mg by mouth daily.  . Cholecalciferol (VITAMIN D3) 25 MCG (1000 UT) CAPS Take 1 capsule by mouth daily.  . Cholecalciferol 2000 units TABS Take 2 tablets (4,000 Units total) by mouth daily.  . clonazePAM (KLONOPIN) 1 MG tablet Take 1 tablet (1 mg  total) by mouth 2 (two) times daily as needed. for anxiety  . fluorometholone (FML) 0.1 % ophthalmic suspension Instill 1 drop Both Eyes 4 times a day  Taper as directed  . gabapentin (NEURONTIN) 100 MG capsule Take 1 capsule (100 mg total) by mouth 2 (two) times daily.  . hydrALAZINE (APRESOLINE) 50 MG tablet Take 1 tablet (50 mg total) by mouth 4 (four) times daily. (Patient taking differently: Take 50 mg by mouth 2 (two) times daily. Take one tablet twice a day.)  . HYDROcodone-acetaminophen (NORCO/VICODIN) 5-325 MG tablet Take 1 tablet by mouth every 6 (six) hours as needed.  [name] ipratropium-albuterol (DUONEB) 0.5-2.5 (3) MG/3ML SOLN SMARTSIG:1 Unit(s) By Mouth Twice Daily  . isosorbide mononitrate (ISMO) 20 MG tablet Take 20 mg by mouth daily.  . mirabegron ER (MYRBETRIQ) 50 MG TB24 tablet Take 1 tablet (50 mg total) by mouth daily.  [name] omeprazole (PRILOSEC) 40 MG capsule Take 1 capsule (40 mg total) by mouth daily.  . promethazine (PHENERGAN) 12.5 MG tablet Take 1 tablet (12.5 mg total) by mouth every 6 (six) hours as needed for nausea or vomiting.  . torsemide (DEMADEX) 20 MG tablet Take 1 tablet (20 mg total) by mouth daily.  . traZODone (DESYREL) 100 MG tablet Take 1 tablet (100 mg total) by mouth at bedtime.  [name] spironolactone (ALDACTONE) 25 MG tablet Take 1 tablet (25 mg total) by mouth daily. (Patient taking differently: Take 12.5 mg by mouth daily. )   No current facility-administered medications on file prior to visit.      Allergies:   Amlodipine, Enalapril, Losartan potassium, Morphine and related, and Metformin   Social History   Tobacco Use  . Smoking status: Never Smoker  . Smokeless tobacco: Never Used  Substance Use Topics  . Alcohol use: No  . Drug use: No    Family History: The patient's family history includes Alcohol abuse in an other family member; Cancer in her father; Emphysema in her brother; Heart disease in her brother and sister; Hypertension in an other  family member; Kidney disease in an other family member; Mental illness in an other family member.  ROS:   Please see the history of present illness.  Additional pertinent ROS: Constitutional: Negative for chills, fever, night sweats, unintentional weight loss  HENT: Negative for ear pain and hearing loss.   Eyes: Negative for loss of vision and eye pain.  Respiratory: Negative for cough, sputum, wheezing.   Cardiovascular: See HPI. Gastrointestinal: Negative for abdominal pain, melena, and hematochezia.  Genitourinary: Negative for dysuria and hematuria.  Musculoskeletal: Negative for falls and myalgias.  Skin: Negative for itching and rash.  Neurological: Negative for focal weakness, focal  sensory changes and loss of consciousness.  Endo/Heme/Allergies: Does not bruise/bleed easily.   EKGs/Labs/Other Studies Reviewed:    The following studies were reviewed today: Echo 07/22/19  1. The left ventricle has normal systolic function with an ejection fraction of 60-65%. The cavity size was normal. Severe basal septal hypertrophy. Left ventricular diastolic Doppler parameters are consistent with pseudonormalization.  2. The right ventricle has normal systolic function. The cavity was normal. There is no increase in right ventricular wall thickness. Right ventricular systolic pressure could not be assessed.  3. There is mild mitral annular calcification present.  4. The aortic valve is tricuspid. Moderate calcification of the aortic valve. Aortic valve regurgitation is trivial by color flow Doppler.  5. The aorta is normal unless otherwise noted.  6. The interatrial septum appears to be lipomatous.  EKG:  EKG is personally reviewed.  The ekg ordered 07/13/19 demonstrates NSR, nonspecific ST changes  Recent Labs: 01/29/2019: B Natriuretic Peptide 144.2 02/01/2019: Magnesium 1.8 07/13/2019: Hemoglobin 13.0; Platelets 146.0; Pro B Natriuretic peptide (BNP) 205.0; TSH 5.85 08/10/2019: BUN 21;  Creatinine, Ser 0.71; Potassium 4.2; Sodium 141  Recent Lipid Panel    Component Value Date/Time   CHOL 194 08/26/2018 0116   TRIG 95 08/26/2018 0116   HDL 69 08/26/2018 0116   CHOLHDL 2.8 08/26/2018 0116   VLDL 19 08/26/2018 0116   LDLCALC 106 (H) 08/26/2018 0116   LDLDIRECT 121.0 06/12/2017 1349    Physical Exam:    VS:  BP (!) 154/68   Pulse 63   Ht _0  (1.575 m)   Wt 222 lb 1.6 oz (100.7 kg)   SpO2 93%   BMI 40.62 kg/m     Wt Readings from Last 3 Encounters:  11/03/19 222 lb 1.6 oz (100.7 kg)  11/02/19 222 lb 9.6 oz (101 kg)  10/18/19 234 lb (106.1 kg)    GEN: Well nourished, well developed in no acute distress HEENT: Normal, moist mucous membranes NECK: No JVD CARDIAC: regular rhythm, normal S1 and S2, no rubs or gallops. 2/6 systolic murmur. VASCULAR: Radial and DP pulses 2+ bilaterally. No carotid bruits RESPIRATORY:  Clear to auscultation without rales, wheezing or rhonchi  ABDOMEN: Soft, non-tender, non-distended MUSCULOSKELETAL:  Ambulates independently SKIN: Warm and dry, trace BL LE edema NEUROLOGIC:  Alert and oriented x 3. No focal neuro deficits noted. PSYCHIATRIC:  Normal affect    ASSESSMENT:    1. Chronic diastolic heart failure ([place])   2. Essential hypertension   3. Encounter for education about heart failure   4. Cardiac risk counseling   5. Counseling on health promotion and disease prevention    PLAN:    Chronic diastolic heart failure: symptoms much improved since last visit. DOE, edema, PND/orthopnea much nearly completely resolved. Weights stable -discussed what diastolic heart failure is at length, what to watch for, how we treat -reviewed medications, what they do at length -taking daily weights, encouraged to continue -watching sodium -reiterated heart failure education -discussed the importance of good BP control  Hypertension: elevated today, but bordered on hypotension yesterday. Reports good control at home. -continue  carvedilol 12.5 mg BID. Avoid bradycardia due to diastolic dysfunction -continue spirinolactone 25 mg daily -continue chlorthalidone 25 mg daily -continue hydralazine 50 mg. Ordered 4 times daily, averages 2 times daily -continue isosorbide 20 mg daily -continue torsemide 20 mg daily (for fluid, as above)  Murmur: aortic sclerosis without stenosis.  Plan for follow up: 6 mos or sooner PRN  TIME SPENT WITH PATIENT: 55  minutes of direct patient care. More than 50% of that time was spent on coordination of care and counseling regarding education on diastolic heart failure, treatment, symptoms to watch for, medication explanations.  Lisa Dresser, MD, PhD [place]  [facility]   Medication Adjustments/Labs and Tests Ordered: Current medicines are reviewed at length with the patient today.  Concerns regarding medicines are outlined above.  No orders of the defined types were placed in this encounter.  No orders of the defined types were placed in this encounter.   Patient Instructions  Medication Instructions:  Torsemide is main fluid pill. Spironolactone and chlorthalidone are helpers but not the main fluid pills.  *If you need a refill on your cardiac medications before your next appointment, please call your pharmacy*  Lab Work: None  Testing/Procedures: None  Follow-Up: At [facility], you and your health needs are our priority.  As part of our continuing mission to provide you with exceptional heart care, we have created designated Provider Care Teams.  These Care Teams include your primary Cardiologist (physician) and Advanced Practice Providers (APPs -  Physician Assistants and Nurse Practitioners) who all work together to provide you with the care you need, when you need it.  Your next appointment:   6 month(s)  The format for your next appointment:   In Person  Provider:   [name], MD      Signed, Lisa Dresser, MD PhD  11/03/2019 6:31 PM    [place]

## 2019-11-04 DIAGNOSIS — J189 Pneumonia, unspecified organism: Secondary | ICD-10-CM | POA: Diagnosis not present

## 2019-11-04 DIAGNOSIS — I10 Essential (primary) hypertension: Secondary | ICD-10-CM | POA: Diagnosis not present

## 2019-11-04 DIAGNOSIS — M25551 Pain in right hip: Secondary | ICD-10-CM | POA: Diagnosis not present

## 2019-11-04 DIAGNOSIS — M1611 Unilateral primary osteoarthritis, right hip: Secondary | ICD-10-CM | POA: Diagnosis not present

## 2019-11-04 DIAGNOSIS — R262 Difficulty in walking, not elsewhere classified: Secondary | ICD-10-CM | POA: Diagnosis not present

## 2019-11-09 DIAGNOSIS — I10 Essential (primary) hypertension: Secondary | ICD-10-CM | POA: Diagnosis not present

## 2019-11-09 DIAGNOSIS — M1611 Unilateral primary osteoarthritis, right hip: Secondary | ICD-10-CM | POA: Diagnosis not present

## 2019-11-09 DIAGNOSIS — R262 Difficulty in walking, not elsewhere classified: Secondary | ICD-10-CM | POA: Diagnosis not present

## 2019-11-09 DIAGNOSIS — M25551 Pain in right hip: Secondary | ICD-10-CM | POA: Diagnosis not present

## 2019-11-10 ENCOUNTER — Other Ambulatory Visit: Payer: Self-pay

## 2019-11-10 ENCOUNTER — Telehealth: Payer: Self-pay

## 2019-11-10 ENCOUNTER — Ambulatory Visit: Payer: Medicare HMO | Admitting: Internal Medicine

## 2019-11-10 ENCOUNTER — Encounter: Payer: Self-pay | Admitting: Pulmonary Disease

## 2019-11-10 ENCOUNTER — Ambulatory Visit: Payer: Medicare HMO | Admitting: Pulmonary Disease

## 2019-11-10 VITALS — BP 130/62 | HR 62 | Ht 62.0 in | Wt 222.6 lb

## 2019-11-10 DIAGNOSIS — G4733 Obstructive sleep apnea (adult) (pediatric): Secondary | ICD-10-CM | POA: Diagnosis not present

## 2019-11-10 NOTE — Patient Instructions (Signed)
History of obstructive sleep apnea Nonrestorative sleep Daytime tiredness fatigue  We will get a home sleep study -Treatment options will include either in lab titration study or auto titrating CPAP  You may need oxygen supplementation at night  I will see you in 6 weeks

## 2019-11-10 NOTE — Telephone Encounter (Signed)
Referral has been entered for sleep study. [facility] Lavella Lemons) stated that they could not see the referral. I have reentered the referral for the sleep study.   Call to [place] - spoke to  [place].    Cecille Rubin, can you please help me!

## 2019-11-10 NOTE — Progress Notes (Signed)
Subjective:    Patient ID: [name], female    DOB: 11/26/1932, 85 y.o.   MRN: [id number]  Patient being seen for history of obstructive sleep apnea  Nonrestorative sleep Daytime fatigue  Diagnosed with obstructive sleep apnea in 2013, was treated with CPAP CPAP setting of 11 She was also on oxygen supplementation at the time Symptoms did improve, she was taken off oxygen supplementation and at the same time start using CPAP  Has had increasing shortness of breath Nonrestorative sleep Vivid dreams Symptoms worsening recently  Follows up with cardiology  Usually wakes up about 2-3 times during the night to go use the bathroom Generally frail, shortness of breath with mild to moderate exertion  Goes to bed about 10-11 PM Takes about 35 minutes to 40 minutes to fall asleep  Wakes up in the morning about 5 AM, takes her few hours to finally get out of bed sometimes up until 10 AM  She is always tired Sleep quality is described as poor  Past Medical History:  Diagnosis Date  . Abdominal pain   . Anemia    NOS  . Anxiety   . Back pain   . Bruises easily   . Chronic diastolic CHF (congestive heart failure) ([employer])    a. 06/2013 EF 65-70%.  . Chronic headaches    HISTORY OF   . Depression   . Diabetes mellitus    type II  DIET CONTROLLED  . Diarrhea   . Hepatitis    AGE 30S  . Hyperlipidemia   . Hypertension   . Mild aortic stenosis    a. 06/2013 Echo: EF 65-70%, mod LVH with focal basal hypertrophy, very mild AS.  Marland Kitchen Osteoarthritis   . Oxygen desaturation during sleep    USES 2 LITERS BEDTIME   VIA CPAP [facility]/2012 [facility]   . Shortness of breath    WITH EXERTION USES 2 L O2 BEDTIME  . Sleep apnea    CPAP WITH O2 2 LITERS 2013 ([facility])  . Wears dentures   . Wears glasses    Family History  Problem Relation Age of Onset  . Cancer Father        prostate cancer  . Heart disease Sister   . Heart disease Brother   . Alcohol abuse Other   . Hypertension Other    . Kidney disease Other   . Mental illness Other   . Emphysema Brother    Social History   Socioeconomic History  . Marital status: Widowed    Spouse name: Not on file  . Number of children: 5  . Years of education: Not on file  . Highest education level: Not on file  Occupational History  . Occupation: retired     Fish farm manager: RETIRED    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[employer]  . Smoking status: Never Smoker  . Smokeless tobacco: Never Used  Substance and Sexual Activity  . Alcohol use: No  . Drug use: No  . Sexual activity: Never  Other Topics Concern  . Not on file  Social History Narrative  . Not on file   Social Determinants of Health   Financial Resource Strain: Low Risk   . Difficulty of Paying Living Expenses: Not hard at all  Food Insecurity: No Food Insecurity  . Worried About Charity fundraiser in the Last Year: Never true  . Ran Out of Food in the Last Year: Never true  Transportation Needs: No Transportation  Needs  . Lack of Transportation (Medical): No  . Lack of Transportation (Non-Medical): No  Physical Activity: Inactive  . Days of Exercise per Week: 0 days  . Minutes of Exercise per Session: 0 min  Stress: No Stress Concern Present  . Feeling of Stress : Only a little  Social Connections: Slightly Isolated  . Frequency of Communication with Friends and Family: More than three times a week  . Frequency of Social Gatherings with Friends and Family: More than three times a week  . Attends Religious Services: 1 to 4 times per year  . Active Member of Clubs or Organizations: Yes  . Attends Archivist Meetings: 1 to 4 times per year  . Marital Status: Widowed  Intimate Partner Violence:   . Fear of Current or Ex-Partner: Not on file  . Emotionally Abused: Not on file  . Physically Abused: Not on file  . Sexually Abused: Not on file   Review of Systems  Constitutional: Negative for fever and unexpected weight change.  HENT: Positive for  congestion and trouble swallowing. Negative for dental problem, ear pain, nosebleeds, postnasal drip, rhinorrhea, sinus pressure, sneezing and sore throat.   Eyes: Negative for redness and itching.  Respiratory: Positive for shortness of breath. Negative for cough, chest tightness and wheezing.   Cardiovascular: Positive for leg swelling. Negative for palpitations.  Gastrointestinal: Negative for nausea and vomiting.  Genitourinary: Negative for dysuria.  Musculoskeletal: Negative for joint swelling.  Skin: Negative for rash.  Allergic/Immunologic: Negative.  Negative for environmental allergies, food allergies and immunocompromised state.  Neurological: Negative for headaches.  Hematological: Bruises/bleeds easily.  Psychiatric/Behavioral: Negative for dysphoric mood. The patient is nervous/anxious.        Objective:   Physical Exam Constitutional:      Appearance: She is obese.  HENT:     Head: Normocephalic and atraumatic.     Nose: No congestion.     Mouth/Throat:     Mouth: Mucous membranes are moist.     Comments: Monitor glucose, crowded oropharynx, Mallampati 4 Eyes:     Pupils: Pupils are equal, round, and reactive to light.  Cardiovascular:     Rate and Rhythm: Normal rate and regular rhythm.  Pulmonary:     Effort: Pulmonary effort is normal. No respiratory distress.     Breath sounds: Normal breath sounds. No stridor. No wheezing or rhonchi.  Musculoskeletal:        General: Normal range of motion.     Cervical back: Normal range of motion and neck supple.  Skin:    General: Skin is warm.     Coloration: Skin is not jaundiced or pale.  Neurological:     General: No focal deficit present.     Mental Status: She is alert.     Cranial Nerves: No cranial nerve deficit.  Psychiatric:        Mood and Affect: Mood normal.        Behavior: Behavior normal.    Vitals:   11/10/19 1122  BP: 130/62  Pulse: 62  SpO2: 92%   Results of the Epworth flowsheet  11/10/2019  Sitting and reading 1  Watching TV 2  Sitting, inactive in a public place (e.g. a theatre or a meeting) 0  As a passenger in a car for an hour without a break 1  Lying down to rest in the afternoon when circumstances permit 2  Sitting and talking to someone 0  Sitting quietly after a lunch without alcohol  1  In a car, while stopped for a few minutes in traffic 0  Total score 7      Assessment & Plan:  .  Nonrestorative sleep  .  Daytime sleepiness  .  Multiple awakenings  .  Vivid dream  .  Diagnosed with obstructive sleep apnea in the past  .  Hypoxemic respiratory failure with oxygen supplementation piped into CPAP in the past  .  Deconditioning  .  Patient is using a nebulizer at home to which she describes improvement in symptoms with nebulizer use-does not have an underlying diagnosis of obstructive lung disease or asthma  Plan: Obtain a home sleep study  May require oxygen supplementation at night  If found to have significant sleep disordered breathing, a decision needs to be made regarding titration study in the hospital versus an auto titrating CPAP  She does have multiple awakenings which may be because she is on a diuretic  Vivid dreams likely related to medications example Coreg, she may also be having a REM pressure from multiple awakenings  She describes improvement in symptoms when she was using CPAP  We will follow-up in about 6 weeks  Encouraged to stay active

## 2019-11-10 NOTE — Telephone Encounter (Signed)
Referral entered and pt is scheduled

## 2019-11-11 DIAGNOSIS — R262 Difficulty in walking, not elsewhere classified: Secondary | ICD-10-CM | POA: Diagnosis not present

## 2019-11-11 DIAGNOSIS — I10 Essential (primary) hypertension: Secondary | ICD-10-CM | POA: Diagnosis not present

## 2019-11-11 DIAGNOSIS — M1611 Unilateral primary osteoarthritis, right hip: Secondary | ICD-10-CM | POA: Diagnosis not present

## 2019-11-11 DIAGNOSIS — M25551 Pain in right hip: Secondary | ICD-10-CM | POA: Diagnosis not present

## 2019-12-02 ENCOUNTER — Other Ambulatory Visit: Payer: Self-pay

## 2019-12-02 NOTE — Patient Outreach (Addendum)
[facility]) Care Management  12/02/2019  [name] 04/04/1933 [id number]   Telephone Assessment   Outreach attempt #1 to patient. Spoke with patient who denies any acute issues or concerns at present. She shares that for the past several months she has been having trouble sleeping at night. She reports that she is "dreaming all night long" and not getting good enough sleep. She went to see pulmonologist last month for this issue. She was told that she needed a home sleep study to further eval this and that she may possibly need oxygen at bedtime. Patient voices that she I still waiting for test to be scheduled. Her daughter has called to follow up on scheduling and they are awaiting to hear back. Patient shares that sh normally gets up about 2-3/night to urinate. This is her norm. She voices that she taking her e last does of diuretic at noon with lunch and not any later in the day. patient denies napping during the day. She reports that she wakes up feeling tired and fatigued often. She voices that this has been her main issue lately and reports that all her other health problems have been fine. She denies any RN CM needs or concerns at this time.      THN CM Care Plan Problem One     Most Recent Value  Care Plan Problem One  Patient with impaired sleeping. [Patient at risk for falls as evidenced by impaired mobility]  Role Documenting the Problem One  Care Management [place] for Problem One  Active  [facility] Long Term Goal   Patient will report completing sleep study within the next 31 days.  [facility]N Long Term Goal Start Date  12/02/19  [facility]N Long Term Goal Met Date  12/02/19  Interventions for Problem One Long Term Goal  RNCM assessed for appt and any barriers to having testing done. RN CM reviewed with pt what to expect with testing.  THN CM Short Term Goal #1   Patient will be able to verbalize at least 2-3 sleep aide measures to help with sleeping well  at night over the next 30  days.  THN CM Short Term Goal #1 Start Date  12/02/19  [facility] CM Short Term Goal #1 Met Date  12/02/19  Interventions for Short Term Goal #1  RN CM discussed pharmocological and nonpharmocological measures with pt    [facility] CM Care Plan Problem Two     Most Recent Value  Care Plan Problem Two  Knowledge deficit related to mgmt of HF.  Role Documenting the Problem Two  Care Management Telephonic [place] for Problem Two  Active  Interventions for Problem Two Long Term Goal   RN CM reviewed action plan with pt. RN CM confirmed pt not currently exhibiting any sxs.  [facility]N Long Term Goal  patient will have no exacerbations and hospital admissions related to HF over the next 60 days.  THN Long Term Goal Start Date  10/29/19  [facility] CM Short Term Goal #1   Patient will report monitoring and recording BP and weight daily in the home over the next 30 days.  THN CM Short Term Goal #1 Start Date  09/06/19  [facility]N CM Short Term Goal #1 Met Date   09/06/19  THN CM Short Term Goal #2   Patient will report adherence to med regimen including diuretics over the next 30 days.  THN CM Short Term Goal #2 Start Date  09/06/19  THN CM Short Term Goal #2 Met Date  10/29/19    [facility] CM Care Plan Problem Three     Most Recent Value  Care Plan Problem Three  Patient with deconditioning-getting HHPT.  Role Documenting the Problem Three  Care Management Telephonic Coordinator  Care Plan for Problem Three  Active  THN Long Term Goal   Patient will complete HHPT therapy over the next 90 days.  THN Long Term Goal Start Date  10/29/19  [facility] CM Short Term Goal #1   Patient will report/verbalize increase strength/mobility in legs over the next 30 days.   THN CM Short Term Goal #1 Start Date  10/29/19  [facility] CM Short Term Goal #1 Met Date  12/02/19     Plan: RN CM discussed with patient next outreach within the month of February. Patient gave verbal consent and in agreement with RN CM follow up  and timeframe. Patient aware that they may contact RN CM sooner for any issues or concerns.   [name], RN,BSN,CCM [place] Management Telephonic Care Management Coordinator Direct Phone: [phone] Toll Free: [phone] Fax: [phone]

## 2019-12-03 ENCOUNTER — Other Ambulatory Visit: Payer: Self-pay | Admitting: Internal Medicine

## 2019-12-03 ENCOUNTER — Ambulatory Visit: Payer: Self-pay

## 2019-12-03 DIAGNOSIS — J4520 Mild intermittent asthma, uncomplicated: Secondary | ICD-10-CM

## 2019-12-05 DIAGNOSIS — J189 Pneumonia, unspecified organism: Secondary | ICD-10-CM | POA: Diagnosis not present

## 2019-12-08 ENCOUNTER — Telehealth: Payer: Self-pay | Admitting: Pulmonary Disease

## 2019-12-08 NOTE — Telephone Encounter (Signed)
Pt/daughter to call me back directly to schedule.

## 2019-12-08 NOTE — Telephone Encounter (Signed)
Pt sched 1/15 @ 3.  Nothing further needed at this time.

## 2019-12-10 ENCOUNTER — Other Ambulatory Visit: Payer: Self-pay

## 2019-12-10 ENCOUNTER — Ambulatory Visit: Payer: Medicare HMO

## 2019-12-10 DIAGNOSIS — G4733 Obstructive sleep apnea (adult) (pediatric): Secondary | ICD-10-CM

## 2019-12-11 DIAGNOSIS — G4733 Obstructive sleep apnea (adult) (pediatric): Secondary | ICD-10-CM | POA: Diagnosis not present

## 2019-12-16 DIAGNOSIS — G4733 Obstructive sleep apnea (adult) (pediatric): Secondary | ICD-10-CM | POA: Diagnosis not present

## 2019-12-20 ENCOUNTER — Telehealth: Payer: Self-pay | Admitting: Pulmonary Disease

## 2019-12-20 DIAGNOSIS — G4733 Obstructive sleep apnea (adult) (pediatric): Secondary | ICD-10-CM

## 2019-12-20 NOTE — Telephone Encounter (Signed)
Called and spoke with Patient.  Dr.Olalere's results and recommendations given.  DME order placed for cpap.  Patient aware of 3 month follow up. Recall placed for 3 month follow up.  Nothing further at this time.  Dr. Ander Slade has reviewed the home sleep test this showed moderate obstructive sleep apnea.   Recommendations   Treatment options are CPAP with the settings auto 5 to 15.    Weight loss measures .   Advise against driving while sleepy & against medication with sedative side effects.    Make appointment for 3 months for compliance with download with Dr. Ander Slade.

## 2019-12-23 ENCOUNTER — Other Ambulatory Visit: Payer: Self-pay

## 2019-12-23 ENCOUNTER — Encounter: Payer: Medicare HMO | Admitting: Adult Health

## 2019-12-23 ENCOUNTER — Ambulatory Visit: Payer: Medicare HMO | Admitting: Pulmonary Disease

## 2019-12-26 ENCOUNTER — Other Ambulatory Visit: Payer: Self-pay | Admitting: Internal Medicine

## 2019-12-26 DIAGNOSIS — I5032 Chronic diastolic (congestive) heart failure: Secondary | ICD-10-CM

## 2019-12-26 DIAGNOSIS — I10 Essential (primary) hypertension: Secondary | ICD-10-CM

## 2019-12-26 DIAGNOSIS — F411 Generalized anxiety disorder: Secondary | ICD-10-CM

## 2019-12-29 ENCOUNTER — Other Ambulatory Visit: Payer: Self-pay | Admitting: Internal Medicine

## 2019-12-29 DIAGNOSIS — K219 Gastro-esophageal reflux disease without esophagitis: Secondary | ICD-10-CM

## 2019-12-29 DIAGNOSIS — K222 Esophageal obstruction: Secondary | ICD-10-CM

## 2019-12-31 ENCOUNTER — Other Ambulatory Visit: Payer: Self-pay

## 2019-12-31 NOTE — Patient Outreach (Signed)
[name]) Care Management  12/31/2019  TEKESHIA [name] 10/04/1933 [id number]   Telephone Assessment    Outreach attempt #1 to patient. [name] denies any acute issues or concerns at present. [name] shares that [name] went yesterday to get COVID vaccine at [facility]. 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 [name] reports that [name] feels fine. [name] states it felt like getting a flu vaccine. [name] goes back on 01/20/2020 for second vaccine. [name] has had no reaction to vaccine. Patient continues to adhere to safety guidelines. [name] reports that [name] had sleepy study at home. [name] has been fitted for CPAP and is supposed to get it delivered next week. Patient states [name] had device several years ago and is familiar with its usage. [name] denies any RN CM needs or concerns at this time.    [facility]N CM Care Plan Problem One     Most Recent Value  Care Plan Problem One  Patient with impaired sleeping. [Patient at risk for falls as evidenced by impaired mobility]  Role Documenting the Problem One  Care Management [place] for Problem One  Active  [facility] Long Term Goal   Patient will report completing sleep study within the next 31 days.  [facility] Long Term Goal Start Date  12/02/19  [facility]N Long Term Goal Met Date  12/31/19  [facility] CM Short Term Goal #1   Patient will be able to verbalize at least 2-3 sleep aide measures to help with sleeping well at night over the next 30  days.  [facility]N CM Short Term Goal #1 Start Date  12/02/19  [facility]N CM Short Term Goal #1 Met Date  12/31/19  [facility]N CM Short Term Goal #2   Patient will report delivery of CPAP device to home and will begin using it over the next 30 days.  [facility]N CM Short Term Goal #2 Start Date  12/31/19  Interventions for Short Term Goal #2  RN CM dsicussed benefits of DME. RN CM discussed importance of using device as ordered and alertign MD if ineffective.     [facility]N CM Care Plan Problem Two     Most Recent Value  Care Plan Problem Two  Knowledge deficit  related to mgmt of HF.  Role Documenting the Problem Two  Care Management Telephonic Coordinator  Care Plan for Problem Two  Active  [facility]N Long Term Goal  patient will have no exacerbations and [name] admissions related to HF over the next 60 days.  THN Long Term Goal Start Date  10/29/19  [facility] CM Short Term Goal #1   Patient will obtain second dose of COVID vaccine within the next 30 days  THN CM Short Term Goal #1 Start Date  12/31/19  Interventions for Short Term Goal #2   RN CM discussed risks and ebefits. RN CM assessed for SEs from first dose. RN CM confirmed second appt scheduled.    THN CM Care Plan Problem Three     Most Recent Value  Care Plan Problem Three  Patient with deconditioning-getting HHPT.  Role Documenting the Problem Three  Care Management Telephonic Coordinator  Care Plan for Problem Three  Active  THN Long Term Goal   Patient will complete HHPT therapy over the next 90 days.  THN Long Term Goal Start Date  10/29/19  [facility] Long Term Goal Met Date  12/31/19      Plan: RN CM discussed with patient next outreach within the month of April. Patient gave verbal consent and  in agreement with RN CM follow up and timeframe. Patient aware that they may contact RN CM sooner for any issues or concerns.  Lisa Montgomery, RN,BSN,CCM [place] Management Telephonic Care Management Coordinator Direct Phone: [phone] Toll Free: [phone] Fax: [phone]

## 2020-01-03 ENCOUNTER — Ambulatory Visit: Payer: Self-pay

## 2020-01-05 DIAGNOSIS — J189 Pneumonia, unspecified organism: Secondary | ICD-10-CM | POA: Diagnosis not present

## 2020-01-06 DIAGNOSIS — G4733 Obstructive sleep apnea (adult) (pediatric): Secondary | ICD-10-CM | POA: Diagnosis not present

## 2020-01-10 ENCOUNTER — Other Ambulatory Visit: Payer: Self-pay | Admitting: Internal Medicine

## 2020-01-10 DIAGNOSIS — R059 Cough, unspecified: Secondary | ICD-10-CM

## 2020-01-10 DIAGNOSIS — R05 Cough: Secondary | ICD-10-CM

## 2020-01-18 ENCOUNTER — Telehealth: Payer: Self-pay | Admitting: Internal Medicine

## 2020-01-18 NOTE — Chronic Care Management (AMB) (Signed)
Chronic Care Management   Note  01/18/2020 Name: [name] MRN: [id number] DOB: Jan 26, 1933  [name] is a 86 y.o. year old female who is a primary care patient of [name], MD. I reached out to [name] by phone today in response to a referral sent by [name]'s PCP, [name], MD.   [name] was given information about Chronic Care Management services today including:  1. CCM service includes personalized support from designated clinical staff supervised by her physician, including individualized plan of care and coordination with other care providers 2. 24/7 contact phone numbers for assistance for urgent and routine care needs. 3. Service will only be billed when office clinical staff spend 20 minutes or more in a month to coordinate care. 4. Only one practitioner may furnish and bill the service in a calendar month. 5. The patient may stop CCM services at any time (effective at the end of the month) by phone call to the office staff. 6. The patient will be responsible for cost sharing (co-pay) of up to 20% of the service fee (after annual deductible is met).  Patient agreed to services and verbal consent obtained.   Follow up plan:   Raynicia Dukes [employer] Scheduler

## 2020-01-27 ENCOUNTER — Ambulatory Visit: Payer: Medicare HMO | Admitting: Pharmacist

## 2020-01-27 ENCOUNTER — Other Ambulatory Visit: Payer: Self-pay

## 2020-01-27 DIAGNOSIS — I5032 Chronic diastolic (congestive) heart failure: Secondary | ICD-10-CM

## 2020-01-27 DIAGNOSIS — E785 Hyperlipidemia, unspecified: Secondary | ICD-10-CM

## 2020-01-27 DIAGNOSIS — N3281 Overactive bladder: Secondary | ICD-10-CM

## 2020-01-27 DIAGNOSIS — G4733 Obstructive sleep apnea (adult) (pediatric): Secondary | ICD-10-CM

## 2020-01-27 DIAGNOSIS — I10 Essential (primary) hypertension: Secondary | ICD-10-CM

## 2020-01-27 NOTE — Chronic Care Management (AMB) (Signed)
Chronic Care Management Pharmacy  Name: BRAILYN HOWSE  MRN: [id number] DOB: 06-Jan-1933  Chief Complaint/ HPI  [name],  86 y.o. , female presents for their Initial CCM visit with the clinical pharmacist via telephone due to COVID-19 Pandemic.  PCP : [name], MD  Their chronic conditions include: HTN, HLD, diastolic HF, OSA, GERD, IBS, dementia, osteoarthritis, OAB, depression/anxiety  Pt doesn't think she needs all the meds she's on. On CPAP now, still lacking energy, but decreased anxiety Trazodone helps with sleep, she thinks it is sapping her energy in the AM though  Office Visits: 07/13/19 Dr Ronnald Ramp OV: SOB, nonproductive cough c/w pulmonary edema. Restarted torsemide. Reduced gabapentin to 100 mg BID.   Consult Visit: 12/20/19 Dr Ander Slade (pulmonary): moderate OSA, recommended CPAP.  11/10/19 Dr Ander Slade (pulmonary): poor sleep, using a nebulizer but does not have dx of COPD or asthma. Rec'd home sleep study.  11/03/19 Dr Harrell Gave (cardiology): continue daily weights, continue same meds. BP elevated in office but controlled at home.  11/02/19 Dr Georgina Snell (sports medicine): hip bursitis, pain improved by steroid injection. Doing Home PT.   09/09/19 [place] [facility]): torsemide 20 mg daily, take 1 extra dose if 3lb wt gain in 1 day or 5lbs in 1 wk. Increase exercise.   08/10/19 [facility] [place] ([facility]): Reduce hydralazine to BID and add back spironolactone 12.5 mg at noon. Check BP BID x 1 month.  07/15/20 Dr Harrell Gave (cardiology): acute exacerbation, increased carvedilol to 12.5 mg BID, start spironolactone 25 mg, PharmD clinic  Medications: Outpatient Encounter Medications as of 01/27/2020  Medication Sig Note  . albuterol (PROVENTIL) (2.5 MG/3ML) 0.083% nebulizer solution INHALE 3 MLS(2.5 MG TOTAL) BY NEBULIZATION TWICE DAILY AS NEEDED FOR WHEEZING OR SHORTNESS OF BREATH   . benzonatate (TESSALON) 200 MG capsule TAKE 1 CAPSULE(200 MG) BY MOUTH TWICE  DAILY AS NEEDED FOR COUGH   . carvedilol (COREG) 12.5 MG tablet Take 1 tablet (12.5 mg total) by mouth 2 (two) times daily with a meal.   . chlorthalidone (HYGROTON) 25 MG tablet Take 25 mg by mouth daily.   . Cholecalciferol 2000 units TABS Take 2 tablets (4,000 Units total) by mouth daily.   . clonazePAM (KLONOPIN) 1 MG tablet TAKE 1 TABLET(1 MG) BY MOUTH TWICE DAILY AS NEEDED FOR ANXIETY   . hydrALAZINE (APRESOLINE) 50 MG tablet Take 1 tablet (50 mg total) by mouth 4 (four) times daily. (Patient taking differently: Take 50 mg by mouth 2 (two) times daily. Take one tablet twice a day.) 08/25/2019: Taking BID   . ipratropium-albuterol (DUONEB) 0.5-2.5 (3) MG/3ML SOLN SMARTSIG:1 Unit(s) By Mouth Twice Daily   . isosorbide mononitrate (ISMO) 20 MG tablet Take 20 mg by mouth daily.   . mirabegron ER (MYRBETRIQ) 50 MG TB24 tablet Take 1 tablet (50 mg total) by mouth daily.   Marland Kitchen omeprazole (PRILOSEC) 40 MG capsule TAKE 1 CAPSULE(40 MG) BY MOUTH DAILY   . promethazine (PHENERGAN) 12.5 MG tablet Take 1 tablet (12.5 mg total) by mouth every 6 (six) hours as needed for nausea or vomiting.   . torsemide (DEMADEX) 20 MG tablet TAKE 1 TABLET(20 MG) BY MOUTH DAILY   . traZODone (DESYREL) 100 MG tablet Take 1 tablet (100 mg total) by mouth at bedtime.   . Cholecalciferol (VITAMIN D3) 25 MCG (1000 UT) CAPS Take 1 capsule by mouth daily.   . fluorometholone (FML) 0.1 % ophthalmic suspension As needed   . gabapentin (NEURONTIN) 100 MG capsule Take 1  capsule (100 mg total) by mouth 2 (two) times daily.   Marland Kitchen spironolactone (ALDACTONE) 25 MG tablet Take 1 tablet (25 mg total) by mouth daily. (Patient taking differently: Take 12.5 mg by mouth daily. )   . [DISCONTINUED] omeprazole (PRILOSEC) 40 MG capsule Take 1 capsule (40 mg total) by mouth daily.    No facility-administered encounter medications on file as of 01/27/2020.     Current Diagnosis/Assessment:  Goals Addressed            This Visit's Progress   .  Pharmacy Care Plan       Current Barriers:  . Chronic Disease Management support, education, and care coordination needs related to CHF, HTN, HLD, and asthma, GERD , OAB  Pharmacist Clinical Goal(s):  Marland Kitchen Maintain BP between 100/60 and 140/90 . Ensure safety, efficacy, and affordability of medications . Prevent exacerbations of heart failure  Interventions: . Comprehensive medication review performed. . Maintain heart medicines as prescribed to prevent fluid buildup and maintain BP . Trial off of trazodone to see if it is contributing to morning fatigue . If sleep worsens off of trazodone, take 1/2 tablet at bedtime instead of whole tablet . Benzonotate will be cheapest with a GoodRx coupon at [employer] or [employer] (~$11), or it will be ~$21 at [employer].  . Consider switching to [facility] mail order pharmacy for lower copays  Patient Self Care Activities:  . Self administers medications as prescribed, Calls pharmacy for medication refills, and Calls provider office for new concerns or questions  Initial goal documentation       Heart Failure/Hypertension   Type: Diastolic  BP today is: [id number]  Office blood pressures are  BP Readings from Last 3 Encounters:  11/10/19 130/62  11/03/19 (!) 154/68  11/02/19 102/62    Lab Results  Component Value Date   CREATININE 0.71 08/10/2019   CREATININE 0.65 07/13/2019   CREATININE 0.72 02/01/2019    Patient checks BP at home daily  Patient home BP readings are ranging: 138/80  Patient has failed these meds in past: amlodipine, enalapril/HCTZ, furosemide, losartan  Patient is currently controlled on the following medications: carvedilol 12.5 mg BID, chlorthalidone 25 mg daily, hydralazine 50 mg BID, isosorbide MN 20 mg daily, spironolactone 12.5 mg (1/2 of 25 mg) daily, torsemide 20 mg daily  We discussed: Pt does not think she needs all the BP meds she is on. She has issues with frequent urination after taking  diuretics in the morning. Discussed why this is beneficial from fluid overload standpoint. Discussed BP goals and what may happen if 1 or more meds are discontinued. Pt agreed to keep taking current regimen until she sees her cardiologist in 2 months.   Plan  Continue current medications and control with diet and exercise   Hyperlipidemia   Lipid Panel     Component Value Date/Time   CHOL 194 08/26/2018 0116   TRIG 95 08/26/2018 0116   HDL 69 08/26/2018 0116   CHOLHDL 2.8 08/26/2018 0116   VLDL 19 08/26/2018 0116   LDLCALC 106 (H) 08/26/2018 0116    ASCVD 10-year risk: n/a due to age  Patient has failed these meds in past: pravastatin Patient is currently controlled on the following medications: no meds  We discussed:  Diet and exercise  Plan  Continue control with diet and exercise   OSA    Tobacco Status:  Social History   Tobacco Use  Smoking Status Never Smoker  Smokeless Tobacco Never Used  Patient has failed these meds in past: n/a Patient is currently controlled on the following medications: Duoneb BID, albuterol neb BID prn,   Using maintenance inhaler regularly? no Frequency of rescue inhaler use:  daily  We discussed:  Pt has not been diagnosed with asthma or COPD, but has issues with SOB due to OSA, heart failure. She does endorse benefit from using inhalers. She uses albuterol nebs daily in the morning.  Plan  Continue current medications  GERD   Patient has failed these meds in past: n/a Patient is currently controlled on the following medications: omeprazole 40 mg daily  We discussed:  Denies issues with PPI.  Plan  Continue current medications   Overactive bladder   Patient has failed these meds in past: Vesicare Patient is currently controlled on the following medications: Myrbetriq 50 mg daily  We discussed:  Myrbetriq does improve urinary frequency, but she still has issues due to multiple diuretics with HF. Discussed balance  between diuretic use and overactive bladder.  Plan  Continue current medications   Anxiety/depression   Patient has failed these meds in past: gabapentin Patient is currently controlled on the following medications: clonazepam 1 mg BID prn, trazodone 100 mg HS prn  We discussed:  Pt is groggy and fatigued in the morning, although she feels she is sleeping better since starting CPAP. She thinks trazodone may be causing this "hangover", discussed holding trazodone for a few nights to assess. Pt also reports she stopped taking gabapentin a month ago, denies any issues since stopping.  Plan  Continue clonazepam Hold trazodone for a few nights to see if AM grogginess improves If difficulty sleeping off trazodone, restart with 1/2 dose  Health Maintenance   Patient is currently controlled on the following medications: vitamin D, calcium carbonate, promethazine 12.5 mg PRN, benzonatate 200 mg PRN, [employer] (aspirin 500 mg)  We discussed:  She takes benzonatate nightly for nonproductive cough. It is not covered by her insurance and she pays $29 at [employer]. Provided patient with GoodRx coupon.  Plan   Continue current medications   Medication Management   Pt uses [place] for all medications Uses 7-day pill box - fills every Saturday Pt endorses 100% compliance  We discussed:  Pt would like to switch to mail order pharmacy for cheaper copays, however she wants to wait until her medications are more stable. She will call me when she wants to make the switch.  Plan  Continue current med management strategy    Follow up: 3 month phone visit  Charlene Brooke, PharmD Clinical Pharmacist [facility] at [facility] [phone]

## 2020-01-27 NOTE — Progress Notes (Signed)
I have reviewed and agree.

## 2020-01-27 NOTE — Patient Instructions (Addendum)
Visit Information  Thank you for meeting with me to discuss your medications! I look forward to working with you to achieve your health care goals. Below is a summary of what we talked about during the visit:  Goals Addressed            This Visit's Progress   . Pharmacy Care Plan       Current Barriers:  . Chronic Disease Management support, education, and care coordination needs related to CHF, HTN, HLD, and asthma, GERD , OAB  Pharmacist Clinical Goal(s):  Marland Kitchen Maintain BP between 100/60 and 140/90 . Ensure safety, efficacy, and affordability of medications . Prevent exacerbations of heart failure  Interventions: . Comprehensive medication review performed. . Maintain heart medicines as prescribed to prevent fluid buildup and maintain BP . Trial off of trazodone to see if it is contributing to morning fatigue . If sleep worsens off of trazodone, take 1/2 tablet at bedtime instead of whole tablet . Benzonotate will be cheapest with a GoodRx coupon at [employer] or [employer] (~$11), or it will be ~$21 at [employer].  . Consider switching to [facility] mail order pharmacy for lower copays  Patient Self Care Activities:  . Self administers medications as prescribed, Calls pharmacy for medication refills, and Calls provider office for new concerns or questions  Initial goal documentation       Ms. Birchenough was given information about Chronic Care Management services today including:  1. CCM service includes personalized support from designated clinical staff supervised by her physician, including individualized plan of care and coordination with other care providers 2. 24/7 contact phone numbers for assistance for urgent and routine care needs. 3. Service will only be billed when office clinical staff spend 20 minutes or more in a month to coordinate care. 4. Only one practitioner may furnish and bill the service in a calendar month. 5. The patient may stop CCM services  at any time (effective at the end of the month) by phone call to the office staff.  Patient agreed to services and verbal consent obtained.   The patient verbalized understanding of instructions provided today and agreed to receive a mailed copy of patient instruction and/or educational materials. Telephone follow up appointment with pharmacy team member scheduled for: 05/02/20 @ 11:30am  Charlene Brooke, PharmD Clinical Pharmacist [facility] at [facility] [phone]    Heart Failure Medicines  Heart failure is a condition in which the heart cannot pump enough blood through the body. This can cause symptoms such as shortness of breath, fatigue, and confusion. There are two types of heart failure:  Heart failure with reduced ejection fraction. In this type, the heart muscle is weak.  Heart failure with preserved ejection fraction. In this type, the heart muscle does not fill with blood properly and may be stiff. There is no cure for heart failure. However, being treated with medicines and following your health care provider's instructions about a healthy lifestyle can help you stay active, avoid problems, and live longer. Talk to your health care provider about all medicines that you are taking, how often you should take them, and what possible problems (side effects) they may cause. Talk with your health care provider if you have difficulty affording your medicines. What are some common medicines for heart failure? The medicines that are prescribed for you will depend on your symptoms, the type of heart failure you have, and the cause of your heart failure. In some cases, you  may need to take more than one medicine. You will be prescribed the following medicines according to your type of heart failure: Heart failure with preserved ejection fraction  Medicines to control blood pressure, including: ? Beta-blockers. ? Angiotensin-converting enzyme (ACE)  inhibitors. ? Angiotensin II receptor blockers (ARBs).  Diuretics.  Aldosterone antagonists. What should I know about beta-blockers (carvedilol)?  These medicines lower your blood pressure and slow your heart rate. This helps to lessen your heart's workload.  They can help to relieve chest pain (angina).  They can help to improve your heart's ability to pump.  They may cause asthma attacks and shortness of breath.  Because these medicines slow your heart rate, it is important not to overwork yourself while exercising. Talk to your health care provider about what your target heart rate should be while you exercise.  These medicines can hide the symptoms of low blood sugar (glucose), which is also called hypoglycemia. If you have diabetes, make sure to check your blood glucose carefully. If you have hypoglycemia, talk to your health care provider about adjusting your medicines.  Beta-blockers may make you feel dizzy or light-headed at first. Do not drive or use heavy machinery when you first start these medicines. Ask your health care provider when it is safe for you to drive. What should I know about aldosterone antagonists (spironolactone)?  They help the body to remove excess sodium through urination. This helps to lessen the amount of blood that the heart needs to pump.  They can also help to lower blood pressure and improve the heart's ability to pump blood.  They may cause dizziness, diarrhea, coughing, or flu-like symptoms.  They should not be used if you have type 2 diabetes.  They can raise the amount of potassium in the blood. Your potassium levels will be monitored regularly by your health care provider.  These medicines can make men's breasts large and tender. What should I know about diuretics (torsemide, chlorthalidone)?  Diuretics are medicines that help the body get rid of excess fluid through urination. They can also help lessen your heart's workload.  They help  to lessen fluid buildup in the lungs, ankles, and feet.  They help to lower your blood pressure.  They can worsen problems with controlling urination (urinary incontinence).  They may cause dizziness, headaches, muscle cramps, and an upset stomach.  They can cause weak muscles, dry mouth, or confusion. It is important to drink plenty of fluids while taking these medicines, especially while exercising or on hot days. What should I know about nitrates (isosorbide mononitrate)?  Nitrates relax the blood vessels and increase oxygen and blood supply to the heart. They also lower the blood pressure.  They are usually taken to lessen chest pain.  They may cause headaches, flushing, or irregular heartbeat. Summary  A healthy lifestyle and treatment with medicine will relieve symptoms of heart failure.  In some cases, you may need to take more than one medicine.  It is important to talk to your health care provider about how often you should take your medicines. Do not skip a dose or change your dosage.  Talk to your health care provider about possible side effects of these medicines. This information is not intended to replace advice given to you by your health care provider. Make sure you discuss any questions you have with your health care provider. Document Revised: 11/26/2017 Document Reviewed: 03/28/2017 Elsevier Patient Education  [place].

## 2020-01-29 NOTE — Addendum Note (Signed)
Addended by: Aviva Signs M on: 01/29/2020 10:22 AM   Modules accepted: Orders

## 2020-02-02 DIAGNOSIS — J189 Pneumonia, unspecified organism: Secondary | ICD-10-CM | POA: Diagnosis not present

## 2020-02-03 DIAGNOSIS — G4733 Obstructive sleep apnea (adult) (pediatric): Secondary | ICD-10-CM | POA: Diagnosis not present

## 2020-02-07 DIAGNOSIS — H04123 Dry eye syndrome of bilateral lacrimal glands: Secondary | ICD-10-CM | POA: Diagnosis not present

## 2020-02-14 DIAGNOSIS — H04123 Dry eye syndrome of bilateral lacrimal glands: Secondary | ICD-10-CM | POA: Diagnosis not present

## 2020-02-23 ENCOUNTER — Other Ambulatory Visit: Payer: Self-pay | Admitting: Internal Medicine

## 2020-02-23 DIAGNOSIS — F411 Generalized anxiety disorder: Secondary | ICD-10-CM

## 2020-02-23 DIAGNOSIS — G4733 Obstructive sleep apnea (adult) (pediatric): Secondary | ICD-10-CM | POA: Diagnosis not present

## 2020-02-24 ENCOUNTER — Ambulatory Visit: Payer: Self-pay

## 2020-02-28 ENCOUNTER — Other Ambulatory Visit: Payer: Self-pay | Admitting: Internal Medicine

## 2020-02-28 ENCOUNTER — Other Ambulatory Visit: Payer: Self-pay

## 2020-02-28 DIAGNOSIS — N3281 Overactive bladder: Secondary | ICD-10-CM

## 2020-02-28 NOTE — Patient Outreach (Signed)
[facility]) Care Management  02/28/2020  [name] [birth year] [id number]   Telephone Assessment   Outreach attempt to patient. Spoke with patient. She denies any acute issues or concerns at present. She reports that things have been going fairly well for her. She voices that her hip has been bothering her a little bit. She has appt tomorrow to see MD regarding this. She is hopeful that they will give her another injection like previously which helped the issues. She denies any recent falls. Appetite remains WNL for patient. She reports she goes to see heart MD next month. Patient states she feels like she is taking too many meds and wants to discuss stopping some of them with MD. She is also considering switching to mail order pharmacy. Patient reports she has received both of her COVID-9 vaccines. However,she does not have card available and unable to recall exact dates. She voices that she is sleeping better now that she has CPAP machine. She denies any SOB.  She denies any RN CM needs or concerns at this time.   Medications Reviewed Today    Reviewed by Hayden Pedro, RN (Registered Nurse) on 02/28/20 at [phone]  Med List Status: <None>  Medication Order Taking? Sig Documenting Provider Last Dose Status Informant  albuterol (PROVENTIL) (2.5 MG/3ML) 0.083% nebulizer solution [id number] No INHALE 3 MLS(2.5 MG TOTAL) BY NEBULIZATION TWICE DAILY AS NEEDED FOR WHEEZING OR SHORTNESS OF Cherly Beach, MD Taking Active   benzonatate (TESSALON) 200 MG capsule [id number] No TAKE 1 CAPSULE(200 MG) BY MOUTH TWICE DAILY AS NEEDED FOR COUGH [name], MD Taking Active   carvedilol (COREG) 12.5 MG tablet [id number] No Take 1 tablet (12.5 mg total) by mouth 2 (two) times daily with a meal. [name], MD Taking Active   chlorthalidone (HYGROTON) 25 MG tablet [id number] No Take 25 mg by mouth daily. [provider] Taking Active   Cholecalciferol  (VITAMIN D3) 25 MCG (1000 UT) CAPS [id number] No Take 1 capsule by mouth daily. [provider] Taking Active   Cholecalciferol 2000 units TABS [id number] No Take 2 tablets (4,000 Units total) by mouth daily. [name], MD Taking Active Self  clonazePAM (KLONOPIN) 1 MG tablet [id number] No TAKE 1 TABLET(1 MG) BY MOUTH TWICE DAILY AS NEEDED FOR ANXIETY [name], MD Taking Active   fluorometholone (FML) 0.1 % ophthalmic suspension [id number] No As needed [provider] Taking Active   gabapentin (NEURONTIN) 100 MG capsule [id number] No Take 1 capsule (100 mg total) by mouth 2 (two) times daily. [name], MD Taking Active   hydrALAZINE (APRESOLINE) 50 MG tablet [id number] No Take 1 tablet (50 mg total) by mouth 4 (four) times daily.  Patient taking differently: Take 50 mg by mouth 2 (two) times daily. Take one tablet twice a day.   [name], MD Taking Active            Med Note [facility], [facility] E   Wed Aug 25, 2019 11:28 AM) Taking BID   ipratropium-albuterol (DUONEB) 0.5-2.5 (3) MG/3ML SOLN [id number] No SMARTSIG:1 Unit(s) By Mouth Twice Daily [provider] Taking Active   isosorbide mononitrate (ISMO) 20 MG tablet [id number] No Take 20 mg by mouth daily. [provider] Taking Active   mirabegron ER (MYRBETRIQ) 50 MG TB24 tablet [id number] No Take 1 tablet (50 mg total) by mouth daily. [name], MD Taking Active         Discontinued 12/29/19  1809 (Reorder)   omeprazole (PRILOSEC) 40 MG capsule [id number] No TAKE 1 CAPSULE(40 MG) BY MOUTH DAILY [name], MD Taking Active   promethazine (PHENERGAN) 12.5 MG tablet [id number] No Take 1 tablet (12.5 mg total) by mouth every 6 (six) hours as needed for nausea or vomiting. [name], MD Taking Active   spironolactone (ALDACTONE) 25 MG tablet [id number] No Take 1 tablet (25 mg total) by mouth daily.  Patient taking differently: Take 12.5 mg by mouth daily.    [name], MD  Taking Expired 10/14/19 2359   torsemide (DEMADEX) 20 MG tablet [id number] No TAKE 1 TABLET(20 MG) BY MOUTH DAILY [name], MD Taking Active   traZODone (DESYREL) 100 MG tablet [id number]  TAKE 1 TABLET(100 MG) BY MOUTH AT BEDTIME [name], MD  Active          [facility] CM Care Plan Problem One     Most Recent Value  Care Plan Problem One  Patient with impaired sleeping. [Patient at risk for falls as evidenced by impaired mobility]  Role Documenting the Problem One  Care Management [place] for Problem One  Active  [facility] Long Term Goal   Patient will report tolerating CPAP and voice no issues with SOB or difficulty sleeping over the next 60 days.  [facility] Long Term Goal Start Date  02/28/20  [facility] for Problem One Long Term Goal  RN CM assessed and discussed CPPA usage with pt. Reviewed s/s of worsening condition and when to seek medical attention.   [facility] CM Short Term Goal #2   Patient will report delivery of CPAP device to home and will begin using it over the next 30 days.  [facility] CM Short Term Goal #2 Start Date  12/31/19  [facility] CM Short Term Goal #2 Met Date  02/28/20    [facility] CM Care Plan Problem Two     Most Recent Value  Care Plan Problem Two  Knowledge deficit related to mgmt of HF.  Role Documenting the Problem Two  Care Management Telephonic Coordinator  Care Plan for Problem Two  Active  [facility] Long Term Goal  patient will have no exacerbations and hospital admissions related to HF over the next 60 days.  [facility] Long Term Goal Start Date  10/29/19  [facility] Long Term Goal Met Date  02/28/20  [facility] CM Short Term Goal #1   Patient will obtain second dose of COVID vaccine within the next 30 days  [facility] CM Short Term Goal #1 Start Date  12/31/19  [facility] CM Short Term Goal #1 Met Date   02/28/20    [facility] CM Care Plan Problem Three     Most Recent Value  Care Plan Problem Three  Patient with deconditioning-getting HHPT.  Role Documenting the Problem Three  Care Management  Telephonic Coordinator  Care Plan for Problem Three  Not Active       Plan: RN CM discussed with patient next outreach within the month of June. Patient gave verbal consent and in agreement with RN CM follow up and timeframe. Patient aware that they may contact RN CM sooner for any issues or concerns.  RN CM will send quarterly update to PCP. [name], RN,BSN,CCM [place] Management Telephonic Care Management Coordinator Direct Phone: [phone] Toll Free: [phone] Fax: [phone]

## 2020-02-29 ENCOUNTER — Encounter: Payer: Self-pay | Admitting: Family Medicine

## 2020-02-29 ENCOUNTER — Ambulatory Visit: Payer: Medicare HMO | Admitting: Family Medicine

## 2020-02-29 ENCOUNTER — Other Ambulatory Visit: Payer: Self-pay

## 2020-02-29 VITALS — BP 130/80 | HR 71 | Ht 62.0 in | Wt 222.0 lb

## 2020-02-29 DIAGNOSIS — M7062 Trochanteric bursitis, left hip: Secondary | ICD-10-CM

## 2020-02-29 MED ORDER — HYDROCODONE-ACETAMINOPHEN 5-325 MG PO TABS: 1.0000 | ORAL_TABLET | Freq: Four times a day (QID) | ORAL | 0 refills | Status: DC | PRN

## 2020-02-29 NOTE — Progress Notes (Signed)
   Rito Ehrlich, am serving as a Education administrator for Dr. Lynne Leader.  [name] is a 86 y.o. female who presents to [place] at [facility] today for f/u of R hip pain.  She was last seen by Dr. Georgina Snell on 11/02/19 for f/u and was approximately 90% improved after having a R GT injection on 10/18/19.  She had home health PT and was provided w/ a HEP.  Since her last visit, pt reports she is in a lot of pain, cannot lay on the right side and hurts worse at night. Patient stated the shot she got the last time helped a lot and thinks it would be helpful this time.    Pertinent review of systems: No fevers or chills  Relevant historical information: Diastolic heart failure   Exam:  BP 130/80 (BP Location: Left Arm, Patient Position: Sitting, Cuff Size: Large)   Pulse 71   Ht 5\' 2"  (1.575 m)   Wt 222 lb (100.7 kg)   SpO2 94%   BMI 40.60 kg/m  General: Well Developed, well nourished, and in no acute distress.   MSK: Left hip: Normal-appearing normal motion.  Tender to palpation lateral hip greater trochanter. Hip abduction strength diminished.   Hip greater trochanteric injection: Left Consent obtained and timeout performed. Area of maximum tenderness palpated and identified. Skin cleaned with alcohol, cold spray applied. A 21 needle was used to access the greater trochanteric bursa. 40 mg of Kenalog and 2 mL of Marcaine were used to inject the trochanteric bursa. Patient tolerated the procedure well.     Assessment and Plan: 86 y.o. female with left lateral hip pain.  Trochanteric bursitis again.  Patient had about 3 to 4 months of relief from last injection.  Plan to repeat injection and continue home exercise program.  Recheck back as needed.  Very limited hydrocodone for pain control until the cortisone component can start working.   PDMP reviewed during this encounter. No orders of the defined types were placed in this encounter.  Meds ordered this encounter   Medications  . HYDROcodone-acetaminophen (NORCO/VICODIN) 5-325 MG tablet    Sig: Take 1 tablet by mouth every 6 (six) hours as needed.    Dispense:  5 tablet    Refill:  0     Discussed warning signs or symptoms. Please see discharge instructions. Patient expresses understanding.   The above documentation has been reviewed and is accurate and complete Lynne Leader

## 2020-02-29 NOTE — Patient Instructions (Signed)
Thank you for coming in today. Call or go to the ER if you develop a large red swollen joint with extreme pain or oozing puss.  Work on those home exercises.  Use hydrocodone very limited.  Recheck with me as needed.

## 2020-03-05 DIAGNOSIS — G4733 Obstructive sleep apnea (adult) (pediatric): Secondary | ICD-10-CM | POA: Diagnosis not present

## 2020-03-09 ENCOUNTER — Ambulatory Visit: Payer: Medicare HMO | Admitting: Pulmonary Disease

## 2020-03-09 ENCOUNTER — Other Ambulatory Visit: Payer: Self-pay

## 2020-03-09 ENCOUNTER — Encounter: Payer: Self-pay | Admitting: Pulmonary Disease

## 2020-03-09 VITALS — BP 132/62 | HR 60 | Temp 98.7°F | Ht 62.0 in | Wt 218.4 lb

## 2020-03-09 DIAGNOSIS — Z9989 Dependence on other enabling machines and devices: Secondary | ICD-10-CM | POA: Diagnosis not present

## 2020-03-09 DIAGNOSIS — G4733 Obstructive sleep apnea (adult) (pediatric): Secondary | ICD-10-CM | POA: Diagnosis not present

## 2020-03-09 NOTE — Progress Notes (Signed)
Subjective:    Patient ID: [name], female    DOB: December 24, 1932, 86 y.o.   MRN: [id number]  Patient being seen for history of obstructive sleep apnea  She remains compliant with CPAP use on a regular basis No significant issues with her CPAP  She wakes up at least 2-3 times at night to use the bathroom She will usually turn off her CPAP, take the mask off  I did encourage her to leave the machine on, disconnect hose from headgear and use the bathroom that I  Reviewed dreams of improved Daytime sleepiness improve Sleep is more restorative   Diagnosed with obstructive sleep apnea in 2013  Shortness of breath with moderate exertion  Goes to bed about 10-11 PM Takes about 35 minutes to 40 minutes to fall asleep  Wakes up in the morning about 5 AM, takes her few hours to finally get out of bed sometimes up until 10 AM  She is always tired Sleep quality is described as poor  Past Medical History:  Diagnosis Date  . Abdominal pain   . Anemia    NOS  . Anxiety   . Back pain   . Bruises easily   . Chronic diastolic CHF (congestive heart failure) ([place])    a. 06/2013 EF 65-70%.  . Chronic headaches    HISTORY OF   . Depression   . Diabetes mellitus    type II  DIET CONTROLLED  . Diarrhea   . Hepatitis    AGE 30S  . Hyperlipidemia   . Hypertension   . Mild aortic stenosis    a. 06/2013 Echo: EF 65-70%, mod LVH with focal basal hypertrophy, very mild AS.  Marland Kitchen Osteoarthritis   . Oxygen desaturation during sleep    USES 2 LITERS BEDTIME   VIA CPAP 06/2012 [facility]   . Shortness of breath    WITH EXERTION USES 2 L O2 BEDTIME  . Sleep apnea    CPAP WITH O2 2 LITERS 2013 ([facility])  . Wears dentures   . Wears glasses    Family History  Problem Relation Age of Onset  . Cancer Father        prostate cancer  . Heart disease Sister   . Heart disease Brother   . Alcohol abuse Other   . Hypertension Other   . Kidney disease Other   . Mental illness Other   .  Emphysema Brother    Social History   Socioeconomic History  . Marital status: Widowed    Spouse name: Not on file  . Number of children: 5  . Years of education: Not on file  . Highest education level: Not on file  Occupational History  . Occupation: retired     Fish farm manager: RETIRED    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[employer]  . Smoking status: Never Smoker  . Smokeless tobacco: Never Used  Substance and Sexual Activity  . Alcohol use: No  . Drug use: No  . Sexual activity: Never  Other Topics Concern  . Not on file  Social History Narrative  . Not on file   Social Determinants of Health   Financial Resource Strain: Low Risk   . Difficulty of Paying Living Expenses: Not hard at all  Food Insecurity: No Food Insecurity  . Worried About Charity fundraiser in the Last Year: Never true  . Ran Out of Food in the Last Year: Never true  Transportation Needs: No Transportation Needs  .  Lack of Transportation (Medical): No  . Lack of Transportation (Non-Medical): No  Physical Activity: Inactive  . Days of Exercise per Week: 0 days  . Minutes of Exercise per Session: 0 min  Stress: No Stress Concern Present  . Feeling of Stress : Only a little  Social Connections: Slightly Isolated  . Frequency of Communication with Friends and Family: More than three times a week  . Frequency of Social Gatherings with Friends and Family: More than three times a week  . Attends Religious Services: 1 to 4 times per year  . Active Member of Clubs or Organizations: Yes  . Attends Archivist Meetings: 1 to 4 times per year  . Marital Status: Widowed  Intimate Partner Violence:   . Fear of Current or Ex-Partner:   . Emotionally Abused:   Marland Kitchen Physically Abused:   . Sexually Abused:    Review of Systems  Constitutional: Negative for fever and unexpected weight change.  HENT: Positive for congestion and trouble swallowing. Negative for dental problem, ear pain, nosebleeds, postnasal  drip, rhinorrhea, sinus pressure, sneezing and sore throat.   Eyes: Negative for redness and itching.  Respiratory: Positive for shortness of breath. Negative for cough, chest tightness and wheezing.   Cardiovascular: Positive for leg swelling. Negative for palpitations.  Gastrointestinal: Negative for nausea and vomiting.  Genitourinary: Negative for dysuria.  Musculoskeletal: Negative for joint swelling.  Skin: Negative for rash.  Allergic/Immunologic: Negative.  Negative for environmental allergies, food allergies and immunocompromised state.  Neurological: Negative for headaches.  Hematological: Bruises/bleeds easily.  Psychiatric/Behavioral: Negative for dysphoric mood. The patient is nervous/anxious.        Objective:   Physical Exam Constitutional:      Appearance: She is obese.  HENT:     Head: Normocephalic and atraumatic.     Nose: No congestion.     Mouth/Throat:     Mouth: Mucous membranes are moist.     Comments: Monitor glucose, crowded oropharynx, Mallampati 4 Eyes:     Pupils: Pupils are equal, round, and reactive to light.  Cardiovascular:     Rate and Rhythm: Normal rate and regular rhythm.  Pulmonary:     Effort: Pulmonary effort is normal. No respiratory distress.     Breath sounds: Normal breath sounds. No stridor. No wheezing or rhonchi.  Musculoskeletal:     Cervical back: Normal range of motion and neck supple.  Neurological:     Mental Status: She is alert.    Vitals:   03/09/20 1123  BP: 132/62  Pulse: 60  Temp: 98.7 F (37.1 C)  SpO2: 92%   Results of the Epworth flowsheet 11/10/2019  Sitting and reading 1  Watching TV 2  Sitting, inactive in a public place (e.g. a theatre or a meeting) 0  As a passenger in a car for an hour without a break 1  Lying down to rest in the afternoon when circumstances permit 2  Sitting and talking to someone 0  Sitting quietly after a lunch without alcohol 1  In a car, while stopped for a few minutes in  traffic 0  Total score 7     Compliance data reveals 100% compliance with CPAP Average use of 6 hours 38 minutes Machine set between 5 and 15 95 percentile pressure of 11.3 AHI of 0.8  Assessment & Plan:  .  Nonrestorative sleep  .  Daytime sleepiness-this is a little bit better  .  Multiple awakenings -secondary to having to use the  bathroom  .  Vivid dream-improved  .  Diagnosed with obstructive sleep apnea in the past -Tolerating CPAP well  .  Hypoxemic respiratory failure with oxygen supplementation piped into CPAP in the past  .  Deconditioning  .  Patient is using a nebulizer at home to which she describes improvement in symptoms with nebulizer use-does not have an underlying diagnosis of obstructive lung disease or asthma  Plan: Continue CPAP use  Encouraged to stay active  She does have multiple awakenings which may be because she is on a diuretic  Describes improvement in symptoms overall I will follow-up with her in about 3 months

## 2020-03-09 NOTE — Patient Instructions (Signed)
Obstructive sleep apnea -Adequately treated -Continue using CPAP regularly -You may disconnect your CPAP leaving the machine running when you go to the bathroom  Myrbetriq -Try and take it at different times to figure out the most optimal time that helps you the most, try and take it about the same time every day  Continue taking benzonatate -If you have one of the side effects that we talked about then you may discontinue, otherwise, may not affect you the same way  I will see you in 3 months  Call with significant concerns

## 2020-03-14 ENCOUNTER — Encounter: Payer: Self-pay | Admitting: Internal Medicine

## 2020-03-14 DIAGNOSIS — H40013 Open angle with borderline findings, low risk, bilateral: Secondary | ICD-10-CM | POA: Diagnosis not present

## 2020-03-14 DIAGNOSIS — H04123 Dry eye syndrome of bilateral lacrimal glands: Secondary | ICD-10-CM | POA: Diagnosis not present

## 2020-03-14 DIAGNOSIS — H16223 Keratoconjunctivitis sicca, not specified as Sjogren's, bilateral: Secondary | ICD-10-CM | POA: Diagnosis not present

## 2020-03-30 ENCOUNTER — Other Ambulatory Visit: Payer: Self-pay

## 2020-03-30 ENCOUNTER — Ambulatory Visit: Payer: Medicare HMO | Admitting: Cardiology

## 2020-03-30 ENCOUNTER — Encounter: Payer: Self-pay | Admitting: Cardiology

## 2020-03-30 VITALS — BP 148/76 | HR 67 | Ht 62.0 in | Wt 221.8 lb

## 2020-03-30 DIAGNOSIS — Z7189 Other specified counseling: Secondary | ICD-10-CM

## 2020-03-30 DIAGNOSIS — R5382 Chronic fatigue, unspecified: Secondary | ICD-10-CM | POA: Diagnosis not present

## 2020-03-30 DIAGNOSIS — I5032 Chronic diastolic (congestive) heart failure: Secondary | ICD-10-CM | POA: Diagnosis not present

## 2020-03-30 DIAGNOSIS — I350 Nonrheumatic aortic (valve) stenosis: Secondary | ICD-10-CM

## 2020-03-30 DIAGNOSIS — I10 Essential (primary) hypertension: Secondary | ICD-10-CM | POA: Diagnosis not present

## 2020-03-30 NOTE — Patient Instructions (Signed)

## 2020-03-30 NOTE — Progress Notes (Signed)
Cardiology Office Note:    Date:  03/30/2020   ID:  ARNECIA [name], DOB 03-27-1933, MRN [id number]  PCP:  [name], MD  Cardiologist:  [name], MD PhD  Referring MD: [name], MD   CC: follow up  History of Present Illness:    [name] is a 86 y.o. female with a hx of hypertension who is seen for follow up today. I initially met her 07/16/19 as a new consult at the request of [name], MD for the evaluation and management of aortic stenosis and concern for diastolic heart failure.  Today: Breathing is stable. Weight has also been stable. No LE edema recently. Wanted to review the echo again, which we did.  Worried that her blood pressure is too low. She associates this with having no energy to do hobbies and housework. Wonders if she needs to be on a stimulant. Recommended she discuss with Dr. Ronnald Ramp. Typically these have a risk of increasing heart rate and blood pressure, would need to monitor closely.  Uses CPAP at night. Stopped using benzonatate as she felt she had side effects, and it wasn't covered by insurance.   We reviewed her medications today. Tolerating well.   Her blood pressure is slightly elevated in the office, but she reports is was 259D systolic yesterday.  Past Medical History:  Diagnosis Date  . Abdominal pain   . Anemia    NOS  . Anxiety   . Back pain   . Bruises easily   . Chronic diastolic CHF (congestive heart failure) ([name])    a. 06/2013 EF 65-70%.  . Chronic headaches    HISTORY OF   . Depression   . Diabetes mellitus    type II  DIET CONTROLLED  . Diarrhea   . Hepatitis    AGE 30S  . Hyperlipidemia   . Hypertension   . Mild aortic stenosis    a. 06/2013 Echo: EF 65-70%, mod LVH with focal basal hypertrophy, very mild AS.  Marland Kitchen Osteoarthritis   . Oxygen desaturation during sleep    USES 2 LITERS BEDTIME   VIA CPAP 06/2012 [facility]   . Shortness of breath    WITH EXERTION USES 2 L O2 BEDTIME  . Sleep  apnea    CPAP WITH O2 2 LITERS 2013 ([facility])  . Wears dentures   . Wears glasses     Past Surgical History:  Procedure Laterality Date  . ABDOMINAL HYSTERECTOMY    . APPENDECTOMY    . CHOLECYSTECTOMY    . JOINT REPLACEMENT     LEFT KNEE   . KNEE ARTHROPLASTY Right 03/22/2015   Procedure: COMPUTER ASSISTED TOTAL KNEE ARTHROPLASTY;  Surgeon: Marybelle Killings, MD;  Location: [place];  Service: Orthopedics;  Laterality: Right;  . KNEE ARTHROSCOPY     RIGHT  . LEFT AND RIGHT HEART CATHETERIZATION WITH CORONARY ANGIOGRAM N/A 10/28/2013   Procedure: LEFT AND RIGHT HEART CATHETERIZATION WITH CORONARY ANGIOGRAM;  Surgeon: Blane Ohara, MD;  Location: [facility] CATH LAB;  Service: Cardiovascular;  Laterality: N/A;  . LEFT HEART CATHETERIZATION WITH CORONARY ANGIOGRAM N/A 11/14/2014   Procedure: LEFT HEART CATHETERIZATION WITH CORONARY ANGIOGRAM;  Surgeon: Josue Hector, MD;  Location: [facility] CATH LAB;  Service: Cardiovascular;  Laterality: N/A;  . REVISION TOTAL KNEE ARTHROPLASTY  2011  . ROTATOR CUFF REPAIR  2010  . SHOULDER ARTHROSCOPY  09/17/2012   Procedure: ARTHROSCOPY SHOULDER;  Surgeon: Sharmon Revere, MD;  Location: [facility]  OR;  Service: Orthopedics;  Laterality: Left;  . TONSILLECTOMY    . TONSILLECTOMY      Current Medications: Current Outpatient Medications on File Prior to Visit  Medication Sig  . albuterol (PROVENTIL) (2.5 MG/3ML) 0.083% nebulizer solution INHALE 3 MLS(2.5 MG TOTAL) BY NEBULIZATION TWICE DAILY AS NEEDED FOR WHEEZING OR SHORTNESS OF BREATH  . carvedilol (COREG) 12.5 MG tablet Take 1 tablet (12.5 mg total) by mouth 2 (two) times daily with a meal.  . chlorthalidone (HYGROTON) 25 MG tablet Take 25 mg by mouth daily.  . Cholecalciferol (VITAMIN D3) 25 MCG (1000 UT) CAPS Take 1 capsule by mouth daily.  . Cholecalciferol 2000 units TABS Take 2 tablets (4,000 Units total) by mouth daily.  . clonazePAM (KLONOPIN) 1 MG tablet TAKE 1 TABLET(1 MG) BY MOUTH TWICE DAILY AS NEEDED FOR ANXIETY   . fluorometholone (FML) 0.1 % ophthalmic suspension As needed  . hydrALAZINE (APRESOLINE) 50 MG tablet Take 1 tablet (50 mg total) by mouth 4 (four) times daily. (Patient taking differently: Take 50 mg by mouth 2 (two) times daily. Take one tablet twice a day.)  . ipratropium-albuterol (DUONEB) 0.5-2.5 (3) MG/3ML SOLN SMARTSIG:1 Unit(s) By Mouth Twice Daily  . isosorbide mononitrate (ISMO) 20 MG tablet Take 20 mg by mouth daily.  Marland Kitchen MYRBETRIQ 50 MG TB24 tablet TAKE 1 TABLET(50 MG) BY MOUTH DAILY  . omeprazole (PRILOSEC) 40 MG capsule TAKE 1 CAPSULE(40 MG) BY MOUTH DAILY  . promethazine (PHENERGAN) 12.5 MG tablet Take 1 tablet (12.5 mg total) by mouth every 6 (six) hours as needed for nausea or vomiting.  Marland Kitchen spironolactone (ALDACTONE) 25 MG tablet Take 1 tablet (25 mg total) by mouth daily. (Patient taking differently: Take 12.5 mg by mouth daily. )  . torsemide (DEMADEX) 20 MG tablet TAKE 1 TABLET(20 MG) BY MOUTH DAILY  . traZODone (DESYREL) 100 MG tablet TAKE 1 TABLET(100 MG) BY MOUTH AT BEDTIME  . [DISCONTINUED] omeprazole (PRILOSEC) 40 MG capsule Take 1 capsule (40 mg total) by mouth daily.   No current facility-administered medications on file prior to visit.     Allergies:   Amlodipine, Enalapril, Losartan potassium, Morphine and related, and Metformin   Social History   Tobacco Use  . Smoking status: Never Smoker  . Smokeless tobacco: Never Used  Substance Use Topics  . Alcohol use: No  . Drug use: No    Family History: The patient's family history includes Alcohol abuse in an other family member; Cancer in her father; Emphysema in her brother; Heart disease in her brother and sister; Hypertension in an other family member; Kidney disease in an other family member; Mental illness in an other family member.  ROS:   Please see the history of present illness.  Additional pertinent ROS otherwise unremarkable  EKGs/Labs/Other Studies Reviewed:    The following studies were  reviewed today: Echo 07/22/19  1. The left ventricle has normal systolic function with an ejection fraction of 60-65%. The cavity size was normal. Severe basal septal hypertrophy. Left ventricular diastolic Doppler parameters are consistent with pseudonormalization.  2. The right ventricle has normal systolic function. The cavity was normal. There is no increase in right ventricular wall thickness. Right ventricular systolic pressure could not be assessed.  3. There is mild mitral annular calcification present.  4. The aortic valve is tricuspid. Moderate calcification of the aortic valve. Aortic valve regurgitation is trivial by color flow Doppler.  5. The aorta is normal unless otherwise noted.  6. The interatrial septum appears  to be lipomatous.  EKG:  EKG is personally reviewed.  The ekg ordered today demonstrates sinus rhythm, sinus arrhythmia, occasional PVC, nonspecific ST changes  Recent Labs: 07/13/2019: Hemoglobin 13.0; Platelets 146.0; Pro B Natriuretic peptide (BNP) 205.0; TSH 5.85 08/10/2019: BUN 21; Creatinine, Ser 0.71; Potassium 4.2; Sodium 141  Recent Lipid Panel    Component Value Date/Time   CHOL 194 08/26/2018 0116   TRIG 95 08/26/2018 0116   HDL 69 08/26/2018 0116   CHOLHDL 2.8 08/26/2018 0116   VLDL 19 08/26/2018 0116   LDLCALC 106 (H) 08/26/2018 0116   LDLDIRECT 121.0 06/12/2017 1349    Physical Exam:    VS:  BP (!) 148/76   Pulse 67   Ht '5\' 2"'$  (1.575 m)   Wt 221 lb 12.8 oz (100.6 kg)   BMI 40.57 kg/m     Wt Readings from Last 3 Encounters:  03/30/20 221 lb 12.8 oz (100.6 kg)  03/09/20 218 lb 6.4 oz (99.1 kg)  02/29/20 222 lb (100.7 kg)    GEN: Well nourished, well developed in no acute distress HEENT: Normal, moist mucous membranes NECK: No JVD CARDIAC: regular rhythm, normal S1 and S2, no rubs or gallops. 2/6 SE murmur. VASCULAR: Radial and DP pulses 2+ bilaterally. No carotid bruits RESPIRATORY:  Clear to auscultation without rales, wheezing or  rhonchi  ABDOMEN: Soft, non-tender, non-distended MUSCULOSKELETAL:  Ambulates independently SKIN: Warm and dry, no edema NEUROLOGIC:  Alert and oriented x 3. No focal neuro deficits noted. PSYCHIATRIC:  Normal affect   ASSESSMENT:    1. Essential hypertension   2. Chronic diastolic heart failure ([place])   3. Aortic stenosis, mild   4. Encounter for education about heart failure   5. Chronic fatigue    PLAN:    Chronic diastolic heart failure:  -appears euvolemic on exam -refreshed heart failure education -discussed what diastolic heart failure is, reviewed echo again -taking daily weights, encouraged to continue -watching sodium -discussed the importance of good BP control  Hypertension: elevated today, but reports good control at home. -continue carvedilol 12.5 mg BID. Avoid bradycardia due to diastolic dysfunction -continue spirinolactone 25 mg daily -continue chlorthalidone 25 mg daily -continue hydralazine 50 mg. Ordered 4 times daily, averages 2 times daily -continue isosorbide 20 mg daily -continue torsemide 20 mg daily (for fluid, as above)  Murmur: aortic sclerosis with mild stenosis. Unchanged on exam today  Fatigue, chronic: unclear etiology. She asked if I can write for a stimulant such as nuvigil. I do not write for these medications, and we discussed that often stimulants have certain requirements they need to meet before they can be prescribed. I recommended she discuss with Dr. Ronnald Ramp. If started, her blood pressure and heart rate will need to be followed.  Plan for follow up: 6 mos or sooner PRN  [name], MD, PhD [place]  [facility]   Medication Adjustments/Labs and Tests Ordered: Current medicines are reviewed at length with the patient today.  Concerns regarding medicines are outlined above.  Orders Placed This Encounter  Procedures  . EKG 12-Lead   No orders of the defined types were placed in this encounter.   Patient  Instructions  Medication Instructions:  Your Physician recommend you continue on your current medication as directed.    *If you need a refill on your cardiac medications before your next appointment, please call your pharmacy*   Lab Work: None  Testing/Procedures: None   Follow-Up: At [facility], you and your health needs are our  priority.  As part of our continuing mission to provide you with exceptional heart care, we have created designated Provider Care Teams.  These Care Teams include your primary Cardiologist (physician) and Advanced Practice Providers (APPs -  Physician Assistants and Nurse Practitioners) who all work together to provide you with the care you need, when you need it.  We recommend signing up for the patient portal called "MyChart".  Sign up information is provided on this After Visit Summary.  MyChart is used to connect with patients for Virtual Visits (Telemedicine).  Patients are able to view lab/test results, encounter notes, upcoming appointments, etc.  Non-urgent messages can be sent to your provider as well.   To learn more about what you can do with MyChart, go to [email or web address].    Your next appointment:   6 month(s)  The format for your next appointment:   In Person  Provider:   [name], MD       Signed, [name], MD PhD 03/30/2020 5:55 PM    [place]

## 2020-04-02 ENCOUNTER — Other Ambulatory Visit: Payer: Self-pay | Admitting: Internal Medicine

## 2020-04-02 DIAGNOSIS — I10 Essential (primary) hypertension: Secondary | ICD-10-CM

## 2020-04-02 DIAGNOSIS — I5032 Chronic diastolic (congestive) heart failure: Secondary | ICD-10-CM

## 2020-04-03 NOTE — Telephone Encounter (Signed)
LOV with PCP was 06/2019.   Can you call pt some time this week to schedule an appointment with Dr. Ronnald Ramp. I can send in refills once an appointment is made.

## 2020-04-03 NOTE — Telephone Encounter (Signed)
Patient scheduled for 04/10/2020 at 1:20pm.

## 2020-04-04 DIAGNOSIS — G4733 Obstructive sleep apnea (adult) (pediatric): Secondary | ICD-10-CM | POA: Diagnosis not present

## 2020-04-04 NOTE — Telephone Encounter (Signed)
30 day erx sent to pof.

## 2020-04-10 ENCOUNTER — Encounter: Payer: Self-pay | Admitting: Internal Medicine

## 2020-04-10 ENCOUNTER — Ambulatory Visit (INDEPENDENT_AMBULATORY_CARE_PROVIDER_SITE_OTHER): Payer: Medicare HMO | Admitting: Internal Medicine

## 2020-04-10 ENCOUNTER — Other Ambulatory Visit: Payer: Self-pay

## 2020-04-10 VITALS — BP 134/68 | HR 64 | Temp 98.2°F | Resp 16 | Ht 62.0 in | Wt 221.0 lb

## 2020-04-10 DIAGNOSIS — E785 Hyperlipidemia, unspecified: Secondary | ICD-10-CM

## 2020-04-10 DIAGNOSIS — I10 Essential (primary) hypertension: Secondary | ICD-10-CM | POA: Diagnosis not present

## 2020-04-10 DIAGNOSIS — Z6841 Body Mass Index (BMI) 40.0 and over, adult: Secondary | ICD-10-CM | POA: Diagnosis not present

## 2020-04-10 DIAGNOSIS — Z0000 Encounter for general adult medical examination without abnormal findings: Secondary | ICD-10-CM | POA: Diagnosis not present

## 2020-04-10 DIAGNOSIS — G4710 Hypersomnia, unspecified: Secondary | ICD-10-CM

## 2020-04-10 DIAGNOSIS — Z23 Encounter for immunization: Secondary | ICD-10-CM

## 2020-04-10 DIAGNOSIS — D696 Thrombocytopenia, unspecified: Secondary | ICD-10-CM

## 2020-04-10 LAB — BASIC METABOLIC PANEL
BUN: 16 mg/dL (ref 6–23)
CO2: 36 mEq/L — ABNORMAL HIGH (ref 19–32)
Calcium: 9.5 mg/dL (ref 8.4–10.5)
Chloride: 100 mEq/L (ref 96–112)
Creatinine, Ser: 0.78 mg/dL (ref 0.40–1.20)
GFR: 84.9 mL/min (ref >60.00)
Glucose, Bld: 104 mg/dL — ABNORMAL HIGH (ref 70–99)
Potassium: 4.3 mEq/L (ref 3.5–5.1)
Sodium: 142 mEq/L (ref 135–145)

## 2020-04-10 LAB — CBC WITH DIFFERENTIAL/PLATELET
Basophils Absolute: 0 10*3/uL (ref 0.0–0.1)
Basophils Relative: 0.4 % (ref 0.0–3.0)
Eosinophils Absolute: 0.2 10*3/uL (ref 0.0–0.7)
Eosinophils Relative: 1.9 % (ref 0.0–5.0)
HCT: 39.9 % (ref 36.0–46.0)
Hemoglobin: 12.8 g/dL (ref 12.0–15.0)
Lymphocytes Relative: 19.1 % (ref 12.0–46.0)
Lymphs Abs: 2.1 10*3/uL (ref 0.7–4.0)
MCHC: 32.2 g/dL (ref 30.0–36.0)
MCV: 92.8 fl (ref 78.0–100.0)
Monocytes Absolute: 0.6 10*3/uL (ref 0.1–1.0)
Monocytes Relative: 5.5 % (ref 3.0–12.0)
Neutro Abs: 7.9 10*3/uL — ABNORMAL HIGH (ref 1.4–7.7)
Neutrophils Relative %: 73.1 % (ref 43.0–77.0)
Platelets: 153 10*3/uL (ref 150.0–400.0)
RBC: 4.3 Mil/uL (ref 3.87–5.11)
RDW: 14.2 % (ref 11.5–15.5)
WBC: 10.8 10*3/uL — ABNORMAL HIGH (ref 4.0–10.5)

## 2020-04-10 LAB — LIPID PANEL
Cholesterol: 186 mg/dL (ref 0–200)
HDL: 57.9 mg/dL (ref >39.00)
LDL Cholesterol: 106 mg/dL — ABNORMAL HIGH (ref 0–99)
NonHDL: 128.48
Total CHOL/HDL Ratio: 3
Triglycerides: 110 mg/dL (ref 0.0–149.0)
VLDL: 22 mg/dL (ref 0.0–40.0)

## 2020-04-10 LAB — HEPATIC FUNCTION PANEL
ALT: 8 U/L (ref 0–35)
AST: 14 U/L (ref 0–37)
Albumin: 3.9 g/dL (ref 3.5–5.2)
Alkaline Phosphatase: 66 U/L (ref 39–117)
Bilirubin, Direct: 0.1 mg/dL (ref 0.0–0.3)
Total Bilirubin: 0.5 mg/dL (ref 0.2–1.2)
Total Protein: 6.7 g/dL (ref 6.0–8.3)

## 2020-04-10 NOTE — Progress Notes (Signed)
Subjective:  Patient ID: [name], female    DOB: 08/21/1933  Age: 86 y.o. MRN: [id number]  CC: Annual Exam, Hypertension, and Hyperlipidemia  This visit occurred during the SARS-CoV-2 public health emergency.  Safety protocols were in place, including screening questions prior to the visit, additional usage of staff PPE, and extensive cleaning of exam room while observing appropriate contact time as indicated for disinfecting solutions.    HPI [name] presents for a CPX.  She complains of excessive daytime fatigue and somnolence and wants to try Nuvigil.  She denies any recent episodes of headache, blurred vision, chest pain, shortness of breath, or edema.  Outpatient Medications Prior to Visit  Medication Sig Dispense Refill  . albuterol (PROVENTIL) (2.5 MG/3ML) 0.083% nebulizer solution INHALE 3 MLS(2.5 MG TOTAL) BY NEBULIZATION TWICE DAILY AS NEEDED FOR WHEEZING OR SHORTNESS OF BREATH 75 mL 5  . carvedilol (COREG) 12.5 MG tablet Take 1 tablet (12.5 mg total) by mouth 2 (two) times daily with a meal. 180 tablet 3  . Cholecalciferol 2000 units TABS Take 2 tablets (4,000 Units total) by mouth daily. 180 tablet 1  . clonazePAM (KLONOPIN) 1 MG tablet TAKE 1 TABLET(1 MG) BY MOUTH TWICE DAILY AS NEEDED FOR ANXIETY 60 tablet 3  . hydrALAZINE (APRESOLINE) 50 MG tablet Take 1 tablet (50 mg total) by mouth 4 (four) times daily. (Patient taking differently: Take 50 mg by mouth 2 (two) times daily. Take one tablet twice a day.) 120 tablet 5  . ipratropium-albuterol (DUONEB) 0.5-2.5 (3) MG/3ML SOLN SMARTSIG:1 Unit(s) By Mouth Twice Daily    . isosorbide mononitrate (ISMO) 20 MG tablet Take 20 mg by mouth daily.    Marland Kitchen MYRBETRIQ 50 MG TB24 tablet TAKE 1 TABLET(50 MG) BY MOUTH DAILY 90 tablet 1  . omeprazole (PRILOSEC) 40 MG capsule TAKE 1 CAPSULE(40 MG) BY MOUTH DAILY 90 capsule 1  . torsemide (DEMADEX) 20 MG tablet Take 1 tablet (20 mg total) by mouth daily. 30 tablet 0  . traZODone (DESYREL)  100 MG tablet TAKE 1 TABLET(100 MG) BY MOUTH AT BEDTIME 90 tablet 1  . spironolactone (ALDACTONE) 25 MG tablet Take 1 tablet (25 mg total) by mouth daily. (Patient taking differently: Take 12.5 mg by mouth daily. ) 90 tablet 3  . chlorthalidone (HYGROTON) 25 MG tablet Take 25 mg by mouth daily.    . Cholecalciferol (VITAMIN D3) 25 MCG (1000 UT) CAPS Take 1 capsule by mouth daily.    . fluorometholone (FML) 0.1 % ophthalmic suspension As needed    . promethazine (PHENERGAN) 12.5 MG tablet Take 1 tablet (12.5 mg total) by mouth every 6 (six) hours as needed for nausea or vomiting. 30 tablet 0   No facility-administered medications prior to visit.    ROS Review of Systems  Constitutional: Positive for fatigue. Negative for appetite change, diaphoresis and unexpected weight change.  HENT: Negative.   Eyes: Negative.   Respiratory: Negative for cough, chest tightness, shortness of breath and wheezing.   Cardiovascular: Negative for chest pain, palpitations and leg swelling.  Gastrointestinal: Negative for abdominal pain, constipation, diarrhea, nausea and vomiting.  Endocrine: Negative.   Genitourinary: Negative.  Negative for difficulty urinating and dysuria.  Musculoskeletal: Positive for arthralgias. Negative for myalgias.  Skin: Negative.  Negative for color change.  Neurological: Negative for dizziness, weakness, light-headedness and numbness.  Hematological: Negative for adenopathy. Does not bruise/bleed easily.  Psychiatric/Behavioral: Positive for decreased concentration. Negative for behavioral problems, confusion, sleep disturbance and suicidal ideas. The  patient is nervous/anxious.     Objective:  BP 134/68 (BP Location: Left Arm, Patient Position: Sitting, Cuff Size: Large)   Pulse 64   Temp 98.2 F (36.8 C) (Oral)   Resp 16   Ht 5\' 2"  (1.575 m)   Wt 221 lb (100.2 kg)   SpO2 96%   BMI 40.42 kg/m   BP Readings from Last 3 Encounters:  04/10/20 134/68  03/30/20 (!)  148/76  03/09/20 132/62    Wt Readings from Last 3 Encounters:  04/10/20 221 lb (100.2 kg)  03/30/20 221 lb 12.8 oz (100.6 kg)  03/09/20 218 lb 6.4 oz (99.1 kg)    Physical Exam Vitals reviewed.  Constitutional:      Appearance: She is obese.  HENT:     Nose: Nose normal.     Mouth/Throat:     Mouth: Mucous membranes are moist.  Eyes:     General: No scleral icterus.    Conjunctiva/sclera: Conjunctivae normal.  Cardiovascular:     Rate and Rhythm: Normal rate and regular rhythm.     Heart sounds: No murmur.  Pulmonary:     Effort: Pulmonary effort is normal.     Breath sounds: No stridor. No wheezing, rhonchi or rales.  Abdominal:     General: Abdomen is protuberant. Bowel sounds are normal.     Palpations: Abdomen is soft. There is no hepatomegaly, splenomegaly or mass.     Tenderness: There is no abdominal tenderness. There is no guarding.  Musculoskeletal:        General: Normal range of motion.     Cervical back: Neck supple.     Right lower leg: No edema.     Left lower leg: No edema.  Lymphadenopathy:     Cervical: No cervical adenopathy.  Skin:    General: Skin is warm and dry.  Neurological:     General: No focal deficit present.     Mental Status: She is alert and oriented to person, place, and time. Mental status is at baseline.  Psychiatric:        Mood and Affect: Mood normal.        Behavior: Behavior normal.     Lab Results  Component Value Date   WBC 10.8 (H) 04/10/2020   HGB 12.8 04/10/2020   HCT 39.9 04/10/2020   PLT 153.0 04/10/2020   GLUCOSE 104 (H) 04/10/2020   CHOL 186 04/10/2020   TRIG 110.0 04/10/2020   HDL 57.90 04/10/2020   LDLDIRECT 121.0 06/12/2017   LDLCALC 106 (H) 04/10/2020   ALT 8 04/10/2020   AST 14 04/10/2020   NA 142 04/10/2020   K 4.3 04/10/2020   CL 100 04/10/2020   CREATININE 0.78 04/10/2020   BUN 16 04/10/2020   CO2 36 (H) 04/10/2020   TSH 3.85 04/10/2020   INR 1.03 08/25/2018   HGBA1C 5.0 08/26/2018    DG  Chest 2 View  Result Date: 07/13/2019 CLINICAL DATA:  Chronic chest pain. EXAM: CHEST - 2 VIEW COMPARISON:  Chest radiograph 02/09/2019 FINDINGS: Stable cardiomegaly. Tortuosity of the thoracic aorta. Pulmonary vascular redistribution. Mild interstitial pulmonary opacities. No pleural effusion or pneumothorax. Thoracic spine degenerative changes. IMPRESSION: Cardiomegaly and mild interstitial edema. Electronically Signed   By: Lovey Newcomer M.D.   On: 07/13/2019 16:33    Assessment & Plan:   Tyreka was seen today for annual exam, hypertension and hyperlipidemia.  Diagnoses and all orders for this visit:  Essential hypertension- Her blood pressure is adequately well controlled.  Electrolytes and renal function are normal. -     Basic metabolic panel; Future -     Basic metabolic panel  Hyperlipidemia with target LDL less than 130- Treatment with a statin is not indicated. -     Lipid panel; Future -     TSH; Future -     Hepatic function panel; Future -     Hepatic function panel -     TSH -     Lipid panel  Routine general medical examination at a health care facility- Exam completed, labs reviewed, vaccines reviewed and updated, no cancer screenings are indicated, patient education was given.  Thrombocytopenia ([place])- Her platelet count is normal now.  B12 and folate are normal. -     CBC with Differential/Platelet; Future -     Vitamin B12; Future -     Folate; Future -     Folate -     Vitamin B12 -     CBC with Differential/Platelet  Need for Tdap vaccination -     Tdap vaccine greater than or equal to 7yo IM  Excessive somnolence disorder -     Armodafinil (NUVIGIL) 50 MG tablet; Take 2 tablets (100 mg total) by mouth daily.   I have discontinued Ezra H. Roberti's promethazine, fluorometholone, Vitamin D3, and chlorthalidone. I am also having her start on Armodafinil. Additionally, I am having her maintain her Cholecalciferol, hydrALAZINE, carvedilol, spironolactone,  ipratropium-albuterol, isosorbide mononitrate, albuterol, clonazePAM, omeprazole, traZODone, Myrbetriq, and torsemide.  Meds ordered this encounter  Medications  . Armodafinil (NUVIGIL) 50 MG tablet    Sig: Take 2 tablets (100 mg total) by mouth daily.    Dispense:  30 tablet    Refill:  0     Follow-up: Return in about 6 months (around 10/11/2020).  Scarlette Calico, MD

## 2020-04-10 NOTE — Patient Instructions (Signed)
Health Maintenance, Female Adopting a healthy lifestyle and getting preventive care are important in promoting health and wellness. Ask your health care provider about:  The right schedule for you to have regular tests and exams.  Things you can do on your own to prevent diseases and keep yourself healthy. What should I know about diet, weight, and exercise? Eat a healthy diet   Eat a diet that includes plenty of vegetables, fruits, low-fat dairy products, and lean protein.  Do not eat a lot of foods that are high in solid fats, added sugars, or sodium. Maintain a healthy weight Body mass index (BMI) is used to identify weight problems. It estimates body fat based on height and weight. Your health care provider can help determine your BMI and help you achieve or maintain a healthy weight. Get regular exercise Get regular exercise. This is one of the most important things you can do for your health. Most adults should:  Exercise for at least 150 minutes each week. The exercise should increase your heart rate and make you sweat (moderate-intensity exercise).  Do strengthening exercises at least twice a week. This is in addition to the moderate-intensity exercise.  Spend less time sitting. Even light physical activity can be beneficial. Watch cholesterol and blood lipids Have your blood tested for lipids and cholesterol at 84 years of age, then have this test every 5 years. Have your cholesterol levels checked more often if:  Your lipid or cholesterol levels are high.  You are older than 84 years of age.  You are at high risk for heart disease. What should I know about cancer screening? Depending on your health history and family history, you may need to have cancer screening at various ages. This may include screening for:  Breast cancer.  Cervical cancer.  Colorectal cancer.  Skin cancer.  Lung cancer. What should I know about heart disease, diabetes, and high blood  pressure? Blood pressure and heart disease  High blood pressure causes heart disease and increases the risk of stroke. This is more likely to develop in people who have high blood pressure readings, are of African descent, or are overweight.  Have your blood pressure checked: ? Every 3-5 years if you are 18-39 years of age. ? Every year if you are 40 years old or older. Diabetes Have regular diabetes screenings. This checks your fasting blood sugar level. Have the screening done:  Once every three years after age 40 if you are at a normal weight and have a low risk for diabetes.  More often and at a younger age if you are overweight or have a high risk for diabetes. What should I know about preventing infection? Hepatitis B If you have a higher risk for hepatitis B, you should be screened for this virus. Talk with your health care provider to find out if you are at risk for hepatitis B infection. Hepatitis C Testing is recommended for:  Everyone born from 1945 through 1965.  Anyone with known risk factors for hepatitis C. Sexually transmitted infections (STIs)  Get screened for STIs, including gonorrhea and chlamydia, if: ? You are sexually active and are younger than 84 years of age. ? You are older than 84 years of age and your health care provider tells you that you are at risk for this type of infection. ? Your sexual activity has changed since you were last screened, and you are at increased risk for chlamydia or gonorrhea. Ask your health care provider if   you are at risk.  Ask your health care provider about whether you are at high risk for HIV. Your health care provider may recommend a prescription medicine to help prevent HIV infection. If you choose to take medicine to prevent HIV, you should first get tested for HIV. You should then be tested every 3 months for as long as you are taking the medicine. Pregnancy  If you are about to stop having your period (premenopausal) and  you may become pregnant, seek counseling before you get pregnant.  Take 400 to 800 micrograms (mcg) of folic acid every day if you become pregnant.  Ask for birth control (contraception) if you want to prevent pregnancy. Osteoporosis and menopause Osteoporosis is a disease in which the bones lose minerals and strength with aging. This can result in bone fractures. If you are 65 years old or older, or if you are at risk for osteoporosis and fractures, ask your health care provider if you should:  Be screened for bone loss.  Take a calcium or vitamin D supplement to lower your risk of fractures.  Be given hormone replacement therapy (HRT) to treat symptoms of menopause. Follow these instructions at home: Lifestyle  Do not use any products that contain nicotine or tobacco, such as cigarettes, e-cigarettes, and chewing tobacco. If you need help quitting, ask your health care provider.  Do not use street drugs.  Do not share needles.  Ask your health care provider for help if you need support or information about quitting drugs. Alcohol use  Do not drink alcohol if: ? Your health care provider tells you not to drink. ? You are pregnant, may be pregnant, or are planning to become pregnant.  If you drink alcohol: ? Limit how much you use to 0-1 drink a day. ? Limit intake if you are breastfeeding.  Be aware of how much alcohol is in your drink. In the [place] one drink equals one 12 oz bottle of beer (355 mL), one 5 oz glass of wine (148 mL), or one 1 oz glass of hard liquor (44 mL). General instructions  Schedule regular health, dental, and eye exams.  Stay current with your vaccines.  Tell your health care provider if: ? You often feel depressed. ? You have ever been abused or do not feel safe at home. Summary  Adopting a healthy lifestyle and getting preventive care are important in promoting health and wellness.  Follow your health care provider's instructions about healthy  diet, exercising, and getting tested or screened for diseases.  Follow your health care provider's instructions on monitoring your cholesterol and blood pressure. This information is not intended to replace advice given to you by your health care provider. Make sure you discuss any questions you have with your health care provider. Document Revised: 11/04/2018 Document Reviewed: 11/04/2018 Elsevier Patient Education  2020 [employer]  

## 2020-04-11 ENCOUNTER — Encounter: Payer: Self-pay | Admitting: Internal Medicine

## 2020-04-11 LAB — VITAMIN B12: Vitamin B-12: 241 pg/mL (ref 211–911)

## 2020-04-11 LAB — TSH: TSH: 3.85 u[IU]/mL (ref 0.35–4.50)

## 2020-04-11 LAB — FOLATE: Folate: >23.7 ng/mL (ref >5.9)

## 2020-04-12 ENCOUNTER — Telehealth: Payer: Self-pay

## 2020-04-12 DIAGNOSIS — G4710 Hypersomnia, unspecified: Secondary | ICD-10-CM | POA: Insufficient documentation

## 2020-04-12 MED ORDER — ARMODAFINIL 50 MG PO TABS: 100.0000 mg | ORAL_TABLET | Freq: Every day | ORAL | 0 refills | Status: DC

## 2020-04-12 NOTE — Telephone Encounter (Deleted)
Error

## 2020-04-12 NOTE — Telephone Encounter (Signed)
New message    The patient calling back to speak with the CMA medication name Neigil   Can leave a message home machine.

## 2020-04-12 NOTE — Telephone Encounter (Signed)
Called pt and informed that PCP did rx the Nuvigil.  Informed pt that a prior Josem Kaufmann is probably needed for it.

## 2020-04-13 ENCOUNTER — Other Ambulatory Visit: Payer: Self-pay | Admitting: Internal Medicine

## 2020-04-13 DIAGNOSIS — G4710 Hypersomnia, unspecified: Secondary | ICD-10-CM

## 2020-04-13 DIAGNOSIS — G4733 Obstructive sleep apnea (adult) (pediatric): Secondary | ICD-10-CM

## 2020-04-13 MED ORDER — MODAFINIL 100 MG PO TABS: 100.0000 mg | ORAL_TABLET | Freq: Every day | ORAL | 1 refills | Status: DC

## 2020-04-13 NOTE — Telephone Encounter (Signed)
Key: [id number]  Started PA for the armodafinil. The formulary alternative is modafinil.   Is there any reason that she can take the modafinil?

## 2020-04-14 NOTE — Telephone Encounter (Signed)
PCP changed rx for armodafinil to modafinil.   PA for modafinil started - Key: [id number]

## 2020-04-20 ENCOUNTER — Other Ambulatory Visit: Payer: Self-pay | Admitting: Internal Medicine

## 2020-04-20 DIAGNOSIS — F411 Generalized anxiety disorder: Secondary | ICD-10-CM

## 2020-04-25 ENCOUNTER — Other Ambulatory Visit: Payer: Self-pay

## 2020-04-25 NOTE — Patient Outreach (Addendum)
[facility]) Care Management  04/25/2020  BRYANDA MIKEL [birth year] [id number]   Telephone Assessment Annual Assessment   Outreach attempt #1 to patient. Spoke with patient. She reports that she is doing fairly well. Patient shares that she feels like she is resting a little better but still not normal. Her sleep gets interrupted when she had to get up and go tot he bathrooms during the middle of the night. She is wearing her CPAP as ordered. Patient reports MD started her on new med to help her be 'more alert" in the mornings and daytime. She does not like the med. She feels like it has been interfering with her other meds and plans to talk with DM regarding it. Patient denies any recent falls. She continues to use walker or cane to aide in ambulating. She remains independent with ADLs/IADLs. Appetite is WNL for patient. She reports wgt stable.  Medications Reviewed Today    Reviewed by [name], MD (Physician) on 04/10/20 at 1352  Med List Status: <None>  Medication Order Taking? Sig Documenting Provider Last Dose Status Informant  albuterol (PROVENTIL) (2.5 MG/3ML) 0.083% nebulizer solution [id number] Yes INHALE 3 MLS(2.5 MG TOTAL) BY NEBULIZATION TWICE DAILY AS NEEDED FOR WHEEZING OR SHORTNESS OF Cherly [place], MD Taking Active   carvedilol (COREG) 12.5 MG tablet [id number] Yes Take 1 tablet (12.5 mg total) by mouth 2 (two) times daily with a meal. [name], MD Taking Active   Cholecalciferol 2000 units TABS [id number] Yes Take 2 tablets (4,000 Units total) by mouth daily. [name], MD Taking Active Self  clonazePAM (KLONOPIN) 1 MG tablet [id number] Yes TAKE 1 TABLET(1 MG) BY MOUTH TWICE DAILY AS NEEDED FOR ANXIETY [name], MD Taking Active   hydrALAZINE (APRESOLINE) 50 MG tablet [id number] Yes Take 1 tablet (50 mg total) by mouth 4 (four) times daily.  Patient taking differently: Take 50 mg by mouth 2 (two) times daily. Take one tablet  twice a day.   [name], MD Taking Active            Med Note [facility], COLLEEN E   Wed Aug 25, 2019 11:28 AM) Taking BID   ipratropium-albuterol (DUONEB) 0.5-2.5 (3) MG/3ML SOLN [id number] Yes SMARTSIG:1 Unit(s) By Mouth Twice Daily [provider] Taking Active   isosorbide mononitrate (ISMO) 20 MG tablet [id number] Yes Take 20 mg by mouth daily. [provider] Taking Active   MYRBETRIQ 50 MG TB24 tablet [id number] Yes TAKE 1 TABLET(50 MG) BY MOUTH DAILY [name], MD Taking Active         Discontinued 12/29/19 1809 (Reorder)   omeprazole (PRILOSEC) 40 MG capsule [id number] Yes TAKE 1 CAPSULE(40 MG) BY MOUTH DAILY [name], MD Taking Active   spironolactone (ALDACTONE) 25 MG tablet [id number]  Take 1 tablet (25 mg total) by mouth daily.  Patient taking differently: Take 12.5 mg by mouth daily.    [name], MD  Expired 03/30/20 2359   torsemide (DEMADEX) 20 MG tablet [id number] Yes Take 1 tablet (20 mg total) by mouth daily. [name], MD Taking Active   traZODone (DESYREL) 100 MG tablet [id number] Yes TAKE 1 TABLET(100 MG) BY MOUTH AT BEDTIME [name], MD Taking Active           Fall Risk  04/25/2020 02/28/2020 12/02/2019 08/31/2019 08/19/2019  Falls in the past year? 0 0 0 0 1  Number falls in past yr: 0 1 0 0 0  Injury with Fall? 0 0 0 0 0  Risk Factor Category  - - - - -  Risk for fall due to : Impaired vision;Medication side effect - Impaired balance/gait;Impaired mobility;Medication side effect Impaired balance/gait;Impaired mobility;Medication side effect History of fall(s);Impaired balance/gait;Impaired mobility  Follow up Falls evaluation completed;Education provided Falls evaluation completed Education provided;Falls evaluation completed;Falls prevention discussed - -   Depression screen [facility] 2/9 04/25/2020 09/06/2019 08/24/2019 08/19/2019 07/13/2019  Decreased Interest 0 0 0 1 2  Down, Depressed, Hopeless 0 0 0 2 2  PHQ - 2 Score 0  0 0 3 4  Altered sleeping - - - 3 1  Tired, decreased energy - - - 1 1  Change in appetite - - - 1 2  Feeling bad or failure about yourself  - - - 1 1  Trouble concentrating - - - 0 0  Moving slowly or fidgety/restless - - - 0 0  Suicidal thoughts - - - 0 0  PHQ-9 Score - - - 9 9  Difficult doing work/chores - - - - Somewhat difficult  Some recent data might be hidden    SDOH Screenings   Alcohol Screen:   . Last Alcohol Screening Score (AUDIT):   Depression (PHQ2-9): Low Risk   . PHQ-2 Score: 0  Financial Resource Strain: Low Risk   . Difficulty of Paying Living Expenses: Not hard at all  Food Insecurity: No Food Insecurity  . Worried About Charity fundraiser in the Last Year: Never true  . Ran Out of Food in the Last Year: Never true  Housing:   . Last Housing Risk Score:   Physical Activity: Inactive  . Days of Exercise per Week: 0 days  . Minutes of Exercise per Session: 0 min  Social Connections: Slightly Isolated  . Frequency of Communication with Friends and Family: More than three times a week  . Frequency of Social Gatherings with Friends and Family: More than three times a week  . Attends Religious Services: 1 to 4 times per year  . Active Member of Clubs or Organizations: Yes  . Attends Archivist Meetings: 1 to 4 times per year  . Marital Status: Widowed  Stress: No Stress Concern Present  . Feeling of Stress : Only a little  Tobacco Use: Low Risk   . Smoking Tobacco Use: Never Smoker  . Smokeless Tobacco Use: Never Used  Transportation Needs: No Transportation Needs  . Lack of Transportation (Medical): No  . Lack of Transportation (Non-Medical): No     [facility] CM Care Plan Problem One     Most Recent Value  Care Plan Problem One  Patient with impaired sleeping. [Patient at risk for falls as evidenced by impaired mobility]  Role Documenting the Problem One  Care Management [place] for Problem One  Active  [facility] Long Term  Goal   Patient will report tolerating CPAP and voice no issues with SOB or difficulty sleeping over the next 60 days.  [facility] Long Term Goal Start Date  02/28/20  [facility] Long Term Goal Met Date  04/25/20    [facility] CM Care Plan Problem Two     Most Recent Value  Care Plan Problem Two  Knowledge deficit related to mgmt of HF.  Role Documenting the Problem Two  Care Management Telephonic [place] for Problem Two  Active  Interventions for Problem Two Long Term Goal   RNCM assessed for any acute issues or concerns. RNCM  assessed for med adherence. RN CM reinforced diet education to pt. RNCM discussed s/s of worsening condition and when to seek medical attention.   [facility] Long Term Goal  patient will have no exacerbations and hospital admissions related to HF over the next 60 days.  [facility] Long Term Goal Start Date  04/25/20     Plan: RN CM discussed with patient next outreach within the month of  August. Patient gave verbal consent and in agreement with RN CM follow up and timeframe. Patient aware that they may contact RN CM sooner for any issues or concerns.   [name], RN,BSN,CCM [place] Management Telephonic Care Management Coordinator Direct Phone: [phone] Toll Free: [phone] Fax: [phone]

## 2020-04-27 ENCOUNTER — Ambulatory Visit: Payer: Self-pay

## 2020-05-02 ENCOUNTER — Ambulatory Visit: Payer: Medicare HMO | Admitting: Pharmacist

## 2020-05-02 ENCOUNTER — Other Ambulatory Visit: Payer: Self-pay

## 2020-05-02 ENCOUNTER — Other Ambulatory Visit: Payer: Self-pay | Admitting: Internal Medicine

## 2020-05-02 DIAGNOSIS — I5032 Chronic diastolic (congestive) heart failure: Secondary | ICD-10-CM

## 2020-05-02 DIAGNOSIS — F411 Generalized anxiety disorder: Secondary | ICD-10-CM

## 2020-05-02 DIAGNOSIS — I10 Essential (primary) hypertension: Secondary | ICD-10-CM

## 2020-05-02 DIAGNOSIS — G4710 Hypersomnia, unspecified: Secondary | ICD-10-CM

## 2020-05-02 DIAGNOSIS — E785 Hyperlipidemia, unspecified: Secondary | ICD-10-CM

## 2020-05-02 MED ORDER — TRAZODONE HCL 150 MG PO TABS: 150.0000 mg | ORAL_TABLET | Freq: Every day | ORAL | 1 refills | Status: DC

## 2020-05-02 NOTE — Chronic Care Management (AMB) (Signed)
Chronic Care Management Pharmacy  Name: [name]  MRN: [id number] DOB: Nov 07, 1933  Chief Complaint/ HPI  [name],  86 y.o. , female presents for their Initial CCM visit with the clinical pharmacist via telephone due to COVID-19 Pandemic.  PCP : [name], MD  Their chronic conditions include: HTN, HLD, diastolic HF, OSA, GERD, IBS, dementia, osteoarthritis, OAB, depression/anxiety  Pt doesn't think she needs all the meds she's on. On CPAP now, still lacking energy, but decreased anxiety Trazodone helps with sleep, she thinks it is sapping her energy in the AM though  Office Visits: 04/10/20 Dr Ronnald Ramp OV: started Nuvigil 50 mg - 2 tabs daily for fatigue. Needed PA, switched to modafinil 100 mg.  07/13/19 Dr Ronnald Ramp OV: SOB, nonproductive cough c/w pulmonary edema. Restarted torsemide. Reduced gabapentin to 100 mg BID.   Consult Visit: 03/30/20 Dr Harrell Gave (cardiology): daily weights, continue same meds. Pt asking about stimulant like Nuvigil, told to discuss with PCP.  03/09/20 Dr Gala Murdoch (pulmonary): 100% compliance with CPAP. Multiple awakenings d/t diurietic  02/29/20 Dr Georgina Snell (sports med): hip pain, injections and hydrocodone short course (#5).  12/20/19 Dr Ander Slade (pulmonary): moderate OSA, recommended CPAP.    11/10/19 Dr Ander Slade (pulmonary): poor sleep, using a nebulizer but does not have dx of COPD or asthma. Rec'd home sleep study.  11/03/19 Dr Harrell Gave (cardiology): continue daily weights, continue same meds. BP elevated in office but controlled at home.  11/02/19 Dr Georgina Snell (sports medicine): hip bursitis, pain improved by steroid injection. Doing Home PT.   09/09/19 [place] [facility]): torsemide 20 mg daily, take 1 extra dose if 3lb wt gain in 1 day or 5lbs in 1 wk. Increase exercise.   08/10/19 [facility] [place] ([facility]): Reduce hydralazine to BID and add back spironolactone 12.5 mg at noon. Check BP BID x 1 month.  07/15/20 Dr Harrell Gave  (cardiology): acute exacerbation, increased carvedilol to 12.5 mg BID, start spironolactone 25 mg, PharmD clinic  Medications: Outpatient Encounter Medications as of 05/02/2020  Medication Sig Note  . albuterol (PROVENTIL) (2.5 MG/3ML) 0.083% nebulizer solution INHALE 3 MLS(2.5 MG TOTAL) BY NEBULIZATION TWICE DAILY AS NEEDED FOR WHEEZING OR SHORTNESS OF BREATH   . carvedilol (COREG) 12.5 MG tablet Take 1 tablet (12.5 mg total) by mouth 2 (two) times daily with a meal.   . Cholecalciferol 2000 units TABS Take 2 tablets (4,000 Units total) by mouth daily.   . clonazePAM (KLONOPIN) 1 MG tablet TAKE 1 TABLET(1 MG) BY MOUTH TWICE DAILY AS NEEDED FOR ANXIETY   . hydrALAZINE (APRESOLINE) 50 MG tablet Take 1 tablet (50 mg total) by mouth 4 (four) times daily. (Patient taking differently: Take 50 mg by mouth 2 (two) times daily. Take one tablet twice a day.) 08/25/2019: Taking BID   . ipratropium-albuterol (DUONEB) 0.5-2.5 (3) MG/3ML SOLN SMARTSIG:1 Unit(s) By Mouth Twice Daily   . isosorbide mononitrate (ISMO) 20 MG tablet Take 20 mg by mouth daily.   Marland Kitchen MYRBETRIQ 50 MG TB24 tablet TAKE 1 TABLET(50 MG) BY MOUTH DAILY   . omeprazole (PRILOSEC) 40 MG capsule TAKE 1 CAPSULE(40 MG) BY MOUTH DAILY   . torsemide (DEMADEX) 20 MG tablet Take 1 tablet (20 mg total) by mouth daily.   . traZODone (DESYREL) 100 MG tablet TAKE 1 TABLET(100 MG) BY MOUTH AT BEDTIME   . modafinil (PROVIGIL) 100 MG tablet Take 1 tablet (100 mg total) by mouth daily. (Patient not taking: Reported on 05/02/2020)   . spironolactone (ALDACTONE) 25 MG  tablet Take 1 tablet (25 mg total) by mouth daily. (Patient taking differently: Take 12.5 mg by mouth daily. )   . [DISCONTINUED] omeprazole (PRILOSEC) 40 MG capsule Take 1 capsule (40 mg total) by mouth daily.    No facility-administered encounter medications on file as of 05/02/2020.     Current Diagnosis/Assessment:  SDOH Interventions     Most Recent Value  SDOH Interventions    Transportation Interventions  Intervention Not Indicated     Goals Addressed            This Visit's Progress   . Pharmacy Care Plan       CARE PLAN ENTRY  Current Barriers:  . Chronic Disease Management support, education, and care coordination needs related to Hypertension, Hyperlipidemia, Heart Failure, and Insomnia   Hypertension/Heart Failure BP Readings from Last 3 Encounters:  04/10/20 134/68  03/30/20 (!) 148/76  03/09/20 132/62 .  Pharmacist Clinical Goal(s): o Over the next 60 days, patient will work with PharmD and providers to maintain BP goal <140/90 . Current regimen:  o carvedilol 12.5 mg twice a day  o chlorthalidone 25 mg daily,  o hydralazine 50 mg twice a day  o isosorbide MN 20 mg daily,  o spironolactone 12.5 mg (1/2 of 25 mg) daily,  o torsemide 20 mg daily . Interventions: o Discussed BP goal and benefits of medications for maintaining BP in target range o Discussed low blood pressure (less than 110/60) can cause dizziness o Counseled to stand slowly and ensure steadiness before moving to prevent falls . Patient self care activities - Over the next 60 days, patient will: o Check BP daily, document, and provide at future appointments o Ensure daily salt intake < 2300 mg/day o Stand slowly to prevent falls  Hyperlipidemia Lab Results  Component Value Date   LDLCALC 106 (H) 04/10/2020   LDLDIRECT 121.0 06/12/2017 .  Pharmacist Clinical Goal(s): o Over the next 60 days, patient will work with PharmD and providers to achieve LDL goal < 100 . Current regimen:  o No medications . Interventions: o Discussed cholesterol goals and consequences of high cholesterol . Patient self care activities - Over the next 60 days, patient will: o Reduce cholesterol in diet  Insomnia . Pharmacist Clinical Goal(s) o Over the next 60 days, patient will work with PharmD and providers to optimize therapy . Current regimen:  o Trazodone 100 mg at  bedtime . Interventions: o Discussed risks and benefits of higher doses of trazodone, particularly continued grogginess into the morning o Recommend increasing trazodone to 150 mg (1 and 1/2 tablets) at bedtime o Recommended to discuss CPAP adjustments with pulmonologist . Patient self care activities - Over the next 60 days, patient will: o Take medications as prescribed o Follow up with pulmonologist regarding CPAP  Medication management . Pharmacist Clinical Goal(s): o Over the next 60 days, patient will work with PharmD and providers to achieve optimal medication adherence . Current pharmacy: [facility] . Interventions o Comprehensive medication review performed. o Switch to [facility] order pharmacy for lower copays . Patient self care activities - Over the next 60 days, patient will: o Focus on medication adherence by fill date o Take medications as prescribed o Report any questions or concerns to PharmD and/or provider(s)  Please see past updates related to this goal by clicking on the "Past Updates" button in the selected goal        Heart Failure/Hypertension   Type: Diastolic  Last ejection fraction: 60-65% (07/22/2019)  BP goal: < 140/90  Office blood pressures are  BP Readings from Last 3 Encounters:  04/10/20 134/68  03/30/20 (!) 148/76  03/09/20 132/62   Pulse Readings from Last 3 Encounters:  04/10/20 64  03/30/20 67  03/09/20 60   Kidney Function Lab Results  Component Value Date/Time   CREATININE 0.78 04/10/2020 02:03 PM   CREATININE 0.71 08/10/2019 04:27 PM   GFR 84.90 04/10/2020 02:03 PM   GFRNONAA 78 08/10/2019 04:27 PM   GFRAA 90 08/10/2019 04:27 PM   Patient checks BP at home daily  Patient home BP readings are ranging: SBP 134  Patient has failed these meds in past: amlodipine, enalapril/HCTZ, furosemide, losartan  Patient is currently controlled on the following medications:   carvedilol 12.5 mg BID,   chlorthalidone 25 mg daily,    hydralazine 50 mg BID,   isosorbide MN 20 mg daily,   spironolactone 12.5 mg (1/2 of 25 mg) daily,   torsemide 20 mg daily  We discussed: Pt reports she takes diuretics in AM so she is near her bathroom. She also c/o dizziness upon standing; counseled to stand slowly and wait until steady to walk; pt denies low BP < 120.   Plan  Continue current medications and control with diet and exercise    Hyperlipidemia   LDL goal < 130  Lipid Panel     Component Value Date/Time   CHOL 194 08/26/2018 0116   TRIG 95 08/26/2018 0116   HDL 69 08/26/2018 0116   CHOLHDL 2.8 08/26/2018 0116   VLDL 19 08/26/2018 0116   LDLCALC 106 (H) 08/26/2018 0116    ASCVD 10-year risk: n/a due to age   Patient has failed these meds in past: pravastatin Patient is currently controlled on the following medications: no meds  We discussed:  Diet and exercise; no statin indicated due to age.  Plan  Continue control with diet and exercise   OSA   Patient has failed these meds in past: n/a Patient is currently controlled on the following medications:   Duoneb BID  albuterol neb BID prn,   Using maintenance inhaler regularly? no Frequency of rescue inhaler use:  daily  We discussed:  Pt has not been diagnosed with asthma or COPD, but has issues with SOB due to OSA, heart failure. She does endorse benefit from using nebulizer.  Plan  Continue current medications  GERD   Patient has failed these meds in past: n/a Patient is currently controlled on the following medications:   omeprazole 40 mg daily  We discussed:  Denies issues with PPI.  Plan  Continue current medications   Overactive bladder   Patient has failed these meds in past: Vesicare Patient is currently controlled on the following medications:   Myrbetriq 50 mg daily  We discussed:  Myrbetriq does improve urinary frequency, but she still has issues due to multiple diuretics with HF. Discussed balance between  diuretic use and overactive bladder.  Plan  Continue current medications   Anxiety/depression   Patient has failed these meds in past: gabapentin Patient is currently controlled on the following medications:   clonazepam 1 mg BID prn,   trazodone 100 mg HS prn  We discussed:  Last visit, pt was experiencing grogginess in the AM, she thought due to trazodone, and was told to hold trazodone to see if AM grogginess improved. Today, pt reports grogginess actually worsened off of trazodone so she resumed full dose. She also requests higher dose - she previously took 200  mg of trazodone and did will; recommended 150 mg as compromise, pt agreed.  Plan  Continue clonazepam Recommend increasing trazodone to 150 mg HS  Chronic cough   Patient has failed these meds in past: benzonatate Patient is currently uncontrolled on the following medications:  . No medications  We discussed:  Pt reports benzonatate was recently stopped due to concern for side effects. She still has a dry nonproductive cough and asked about Robitussin; discussed issues with cough suppressants for BP control. Discussed possible benefits of OTC antihistamine.  Plan  Recommend OTC 2nd gen antihistamine (Claritin, Zyrtec, Allegra)  Health Maintenance   Patient is currently controlled on the following medications:   vitamin D 2000 IU  calcium carbonate   [employer] (aspirin 500 mg)  We discussed: Patient is satisfied with current OTC regimen and denies issues  Plan   Continue current medications   Medication Management   Pt uses [facility] for all medications Uses 7-day pill box - fills every Saturday Pt endorses 100% compliance  We discussed:  Pt would like to switch to mail order pharmacy for cheaper [place] preferred pharmacy to [facility] mail order    Follow up: 2 month phone visit  Charlene Brooke, PharmD Clinical Pharmacist [facility] at [facility] [phone]

## 2020-05-02 NOTE — Patient Instructions (Addendum)
Visit Information  Phone number for Pharmacist: [phone]  Try Claritin over-the-counter for cough (or Allegra or Zyrtec- all are antihistamines)   Goals Addressed            This Visit's Progress   . Pharmacy Care Plan       CARE PLAN ENTRY  Current Barriers:  . Chronic Disease Management support, education, and care coordination needs related to Hypertension, Hyperlipidemia, Heart Failure, and Insomnia   Hypertension/Heart Failure BP Readings from Last 3 Encounters:  04/10/20 134/68  03/30/20 (!) 148/76  03/09/20 132/62 .  Pharmacist Clinical Goal(s): o Over the next 60 days, patient will work with PharmD and providers to maintain BP goal <140/90 . Current regimen:  o carvedilol 12.5 mg twice a day  o chlorthalidone 25 mg daily,  o hydralazine 50 mg twice a day  o isosorbide MN 20 mg daily,  o spironolactone 12.5 mg (1/2 of 25 mg) daily,  o torsemide 20 mg daily . Interventions: o Discussed BP goal and benefits of medications for maintaining BP in target range o Discussed low blood pressure (less than 110/60) can cause dizziness o Counseled to stand slowly and ensure steadiness before moving to prevent falls . Patient self care activities - Over the next 60 days, patient will: o Check BP daily, document, and provide at future appointments o Ensure daily salt intake < 2300 mg/day o Stand slowly to prevent falls  Hyperlipidemia Lab Results  Component Value Date   LDLCALC 106 (H) 04/10/2020   LDLDIRECT 121.0 06/12/2017 .  Pharmacist Clinical Goal(s): o Over the next 60 days, patient will work with PharmD and providers to maintain LDL goal < 130 . Current regimen:  o No medications . Interventions: o Discussed cholesterol goals and consequences of high cholesterol . Patient self care activities - Over the next 60 days, patient will: o Reduce cholesterol in diet  Insomnia . Pharmacist Clinical Goal(s) o Over the next 60 days, patient will work with PharmD and  providers to optimize therapy . Current regimen:  o Trazodone 100 mg at bedtime . Interventions: o Discussed risks and benefits of higher doses of trazodone, particularly continued grogginess into the morning o Recommend increasing trazodone to 150 mg (1 and 1/2 tablets) at bedtime o Recommended to discuss CPAP adjustments with pulmonologist . Patient self care activities - Over the next 60 days, patient will: o Take medications as prescribed o Follow up with pulmonologist regarding CPAP  Medication management . Pharmacist Clinical Goal(s): o Over the next 60 days, patient will work with PharmD and providers to achieve optimal medication adherence . Current pharmacy: [facility] . Interventions o Comprehensive medication review performed. o Switch to [facility] order pharmacy for lower copays . Patient self care activities - Over the next 60 days, patient will: o Focus on medication adherence by fill date o Take medications as prescribed o Report any questions or concerns to PharmD and/or provider(s)  Please see past updates related to this goal by clicking on the "Past Updates" button in the selected goal       The patient verbalized understanding of instructions provided today and agreed to receive a mailed copy of patient instruction and/or educational materials.  Telephone follow up appointment with pharmacy team member scheduled for: 2 months  Charlene Brooke, PharmD Clinical Pharmacist [facility] at [facility] [phone]  Heart Failure Eating Plan Heart failure, also called congestive heart failure, occurs when your heart does not pump blood well enough to meet your  body's needs for oxygen-rich blood. Heart failure is a long-term (chronic) condition. Living with heart failure can be challenging. However, following your health care provider's instructions about a healthy lifestyle and working with a diet and nutrition specialist (dietitian) to choose the right  foods may help to improve your symptoms. What are tips for following this plan? Reading food labels  Check food labels for the amount of sodium per serving. Choose foods that have less than 140 mg (milligrams) of sodium in each serving.  Check food labels for the number of calories per serving. This is important if you need to limit your daily calorie intake to lose weight.  Check food labels for the serving size. If you eat more than one serving, you will be eating more sodium and calories than what is listed on the label.  Look for foods that are labeled as "sodium-free," "very low sodium," or "low sodium." ? Foods labeled as "reduced sodium" or "lightly salted" may still have more sodium than what is recommended for you. Cooking  Avoid adding salt when cooking. Ask your health care provider or dietitian before using salt substitutes.  Season food with salt-free seasonings, spices, or herbs. Check the label of seasoning mixes to make sure they do not contain salt.  Cook with heart-healthy oils, such as olive, canola, soybean, or sunflower oil.  Do not fry foods. Cook foods using low-fat methods, such as baking, boiling, grilling, and broiling.  Limit unhealthy fats when cooking by: ? Removing the skin from poultry, such as chicken. ? Removing all visible fats from meats. ? Skimming the fat off from stews, soups, and gravies before serving them. Meal planning   Limit your intake of: ? Processed, canned, or pre-packaged foods. ? Foods that are high in trans fat, such as fried foods. ? Sweets, desserts, sugary drinks, and other foods with added sugar. ? Full-fat dairy products, such as whole milk.  Eat a balanced diet that includes: ? 4-5 servings of fruit each day and 4-5 servings of vegetables each day. At each meal, try to fill half of your plate with fruits and vegetables. ? Up to 6-8 servings of whole grains each day. ? Up to 2 servings of lean meat, poultry, or fish each  day. One serving of meat is equal to 3 oz. This is about the same size as a deck of cards. ? 2 servings of low-fat dairy each day. ? Heart-healthy fats. Healthy fats called omega-3 fatty acids are found in foods such as flaxseed and cold-water fish like sardines, salmon, and mackerel.  Aim to eat 25-35 g (grams) of fiber a day. Foods that are high in fiber include apples, broccoli, carrots, beans, peas, and whole grains.  Do not add salt or condiments that contain salt (such as soy sauce) to foods before eating.  When eating at a restaurant, ask that your food be prepared with less salt or no salt, if possible.  Try to eat 2 or more vegetarian meals each week.  Eat more home-cooked food and eat less restaurant, buffet, and fast food. General information  Do not eat more than 2,300 mg of salt (sodium) a day. The amount of sodium that is recommended for you may be lower, depending on your condition.  Maintain a healthy body weight as directed. Ask your health care provider what a healthy weight is for you. ? Check your weight every day. ? Work with your health care provider and dietitian to make a plan that is  right for you to lose weight or maintain your current weight.  Limit how much fluid you drink. Ask your health care provider or dietitian how much fluid you can have each day.  Limit or avoid alcohol as told by your health care provider or dietitian. Recommended foods The items listed may not be a complete list. Talk with your dietitian about what dietary choices are best for you. Fruits All fresh, frozen, and canned fruits. Dried fruits, such as raisins, prunes, and cranberries. Vegetables All fresh vegetables. Vegetables that are frozen without sauce or added salt. Low-sodium or sodium-free canned vegetables. Grains Bread with less than 80 mg of sodium per slice. Whole-wheat pasta, quinoa, and brown rice. Oats and oatmeal. Barley. [place]. Grits and cream of wheat. Whole-grain and  whole-wheat cold cereal. Meats and other protein foods Lean cuts of meat. Skinless chicken and [place]. Fish with high omega-3 fatty acids, such as salmon, sardines, and other cold-water fishes. Eggs. Dried beans, peas, and edamame. Unsalted nuts and nut butters. Dairy Low-fat or nonfat (skim) milk and dried milk. Rice milk, soy milk, and almond milk. Low-fat or nonfat yogurt. Small amounts of reduced-sodium block cheese. Low-sodium cottage cheese. Fats and oils Olive, canola, soybean, flaxseed, or sunflower oil. Avocado. Sweets and desserts Apple sauce. Granola bars. Sugar-free pudding and gelatin. Frozen fruit bars. Seasoning and other foods Fresh and dried herbs. Lemon or lime juice. Vinegar. Low-sodium ketchup. Salt-free marinades, salad dressings, sauces, and seasonings. The items listed above may not be a complete list of foods and beverages you can eat. Contact a dietitian for more information. Foods to avoid The items listed may not be a complete list. Talk with your dietitian about what dietary choices are best for you. Fruits Fruits that are dried with sodium-containing preservatives. Vegetables Canned vegetables. Frozen vegetables with sauce or seasonings. Creamed vegetables. [place] fries. Onion rings. Pickled vegetables and sauerkraut. Grains Bread with more than 80 mg of sodium per slice. Hot or cold cereal with more than 140 mg sodium per serving. Salted pretzels and crackers. Pre-packaged breadcrumbs. Bagels, croissants, and biscuits. Meats and other protein foods Ribs and chicken wings. Bacon, ham, pepperoni, bologna, salami, and packaged luncheon meats. Hot dogs, bratwurst, and sausage. Canned meat. Smoked meat and fish. Salted nuts and seeds. Dairy Whole milk, half-and-half, and cream. Buttermilk. Processed cheese, cheese spreads, and cheese curds. Regular cottage cheese. Feta cheese. Shredded cheese. String cheese. Fats and oils Butter, lard, shortening, ghee, and bacon  fat. Canned and packaged gravies. Seasoning and other foods Onion salt, garlic salt, table salt, and sea salt. Marinades. Regular salad dressings. Relishes, pickles, and olives. Meat flavorings and tenderizers, and bouillon cubes. Horseradish, ketchup, and mustard. Worcestershire sauce. Teriyaki sauce, soy sauce (including reduced sodium). Hot sauce and Tabasco sauce. Steak sauce, fish sauce, oyster sauce, and cocktail sauce. Taco seasonings. Barbecue sauce. Tartar sauce. The items listed above may not be a complete list of foods and beverages you should avoid. Contact a dietitian for more information. Summary  A heart failure eating plan includes changes that limit your intake of sodium and unhealthy fat, and it may help you lose weight or maintain a healthy weight. Your health care provider may also recommend limiting how much fluid you drink.  Most people with heart failure should eat no more than 2,300 mg of salt (sodium) a day. The amount of sodium that is recommended for you may be lower, depending on your condition.  Contact your health care provider or dietitian before making any  major changes to your diet. This information is not intended to replace advice given to you by your health care provider. Make sure you discuss any questions you have with your health care provider. Document Revised: 01/07/2019 Document Reviewed: 03/28/2017 Elsevier Patient Education  [place].

## 2020-05-05 DIAGNOSIS — G4733 Obstructive sleep apnea (adult) (pediatric): Secondary | ICD-10-CM | POA: Diagnosis not present

## 2020-05-11 DIAGNOSIS — G4733 Obstructive sleep apnea (adult) (pediatric): Secondary | ICD-10-CM | POA: Diagnosis not present

## 2020-05-24 ENCOUNTER — Ambulatory Visit (INDEPENDENT_AMBULATORY_CARE_PROVIDER_SITE_OTHER): Payer: Medicare HMO | Admitting: Internal Medicine

## 2020-05-24 ENCOUNTER — Ambulatory Visit (INDEPENDENT_AMBULATORY_CARE_PROVIDER_SITE_OTHER): Payer: Medicare HMO

## 2020-05-24 ENCOUNTER — Other Ambulatory Visit: Payer: Self-pay

## 2020-05-24 ENCOUNTER — Encounter: Payer: Self-pay | Admitting: Internal Medicine

## 2020-05-24 VITALS — BP 140/70 | HR 75 | Temp 98.4°F | Resp 16 | Ht 62.0 in | Wt 222.2 lb

## 2020-05-24 DIAGNOSIS — W19XXXA Unspecified fall, initial encounter: Secondary | ICD-10-CM | POA: Insufficient documentation

## 2020-05-24 DIAGNOSIS — R0781 Pleurodynia: Secondary | ICD-10-CM | POA: Diagnosis not present

## 2020-05-24 DIAGNOSIS — M79602 Pain in left arm: Secondary | ICD-10-CM | POA: Diagnosis not present

## 2020-05-24 DIAGNOSIS — M79622 Pain in left upper arm: Secondary | ICD-10-CM | POA: Insufficient documentation

## 2020-05-24 DIAGNOSIS — S299XXA Unspecified injury of thorax, initial encounter: Secondary | ICD-10-CM | POA: Diagnosis not present

## 2020-05-24 DIAGNOSIS — R0789 Other chest pain: Secondary | ICD-10-CM | POA: Insufficient documentation

## 2020-05-24 DIAGNOSIS — S4992XA Unspecified injury of left shoulder and upper arm, initial encounter: Secondary | ICD-10-CM | POA: Diagnosis not present

## 2020-05-24 MED ORDER — HYDROCODONE-ACETAMINOPHEN 5-325 MG PO TABS: 1.0000 | ORAL_TABLET | Freq: Four times a day (QID) | ORAL | 0 refills | Status: DC | PRN

## 2020-05-24 NOTE — Progress Notes (Signed)
Subjective:  Patient ID: [name], female    DOB: 1933/11/07  Age: 86 y.o. MRN: [id number]  CC: Fall  This visit occurred during the SARS-CoV-2 public health emergency.  Safety protocols were in place, including screening questions prior to the visit, additional usage of staff PPE, and extensive cleaning of exam room while observing appropriate contact time as indicated for disinfecting solutions.    HPI Tanja MARCELYN RUPPE presents for f/up - She sustained a ground-level fall 1 day prior to this visit.  She was in her home, lost her balance, and landed against a chair.  She complains of pain in her left upper arm and left rib cage pain.  She denies chest pain, shortness of breath, or hemoptysis.  She has not gotten much symptom relief with BC powder and wants something for pain.  Outpatient Medications Prior to Visit  Medication Sig Dispense Refill  . albuterol (PROVENTIL) (2.5 MG/3ML) 0.083% nebulizer solution INHALE 3 MLS(2.5 MG TOTAL) BY NEBULIZATION TWICE DAILY AS NEEDED FOR WHEEZING OR SHORTNESS OF BREATH 75 mL 5  . carvedilol (COREG) 12.5 MG tablet Take 1 tablet (12.5 mg total) by mouth 2 (two) times daily with a meal. 180 tablet 3  . Cholecalciferol 2000 units TABS Take 2 tablets (4,000 Units total) by mouth daily. 180 tablet 1  . clonazePAM (KLONOPIN) 1 MG tablet TAKE 1 TABLET(1 MG) BY MOUTH TWICE DAILY AS NEEDED FOR ANXIETY 60 tablet 5  . hydrALAZINE (APRESOLINE) 50 MG tablet Take 1 tablet (50 mg total) by mouth 4 (four) times daily. (Patient taking differently: Take 50 mg by mouth 2 (two) times daily. Take one tablet twice a day.) 120 tablet 5  . ipratropium-albuterol (DUONEB) 0.5-2.5 (3) MG/3ML SOLN SMARTSIG:1 Unit(s) By Mouth Twice Daily    . isosorbide mononitrate (ISMO) 20 MG tablet Take 20 mg by mouth daily.    . modafinil (PROVIGIL) 100 MG tablet Take 1 tablet (100 mg total) by mouth daily. 90 tablet 1  . MYRBETRIQ 50 MG TB24 tablet TAKE 1 TABLET(50 MG) BY MOUTH DAILY 90 tablet 1   . omeprazole (PRILOSEC) 40 MG capsule TAKE 1 CAPSULE(40 MG) BY MOUTH DAILY 90 capsule 1  . torsemide (DEMADEX) 20 MG tablet Take 1 tablet (20 mg total) by mouth daily. 30 tablet 0  . traZODone (DESYREL) 150 MG tablet Take 1 tablet (150 mg total) by mouth at bedtime. 90 tablet 1  . spironolactone (ALDACTONE) 25 MG tablet Take 1 tablet (25 mg total) by mouth daily. (Patient taking differently: Take 12.5 mg by mouth daily. ) 90 tablet 3   No facility-administered medications prior to visit.    ROS Review of Systems  Constitutional: Negative.  Negative for diaphoresis.  HENT: Negative.   Eyes: Negative.   Respiratory: Negative for cough, chest tightness, shortness of breath and wheezing.   Cardiovascular: Positive for chest pain. Negative for palpitations and leg swelling.  Gastrointestinal: Negative for abdominal pain, diarrhea and nausea.  Endocrine: Negative.   Genitourinary: Negative.  Negative for hematuria.  Musculoskeletal: Positive for arthralgias. Negative for back pain, myalgias and neck pain.  Skin: Negative.   Neurological: Negative.  Negative for dizziness, weakness and headaches.  Hematological: Negative for adenopathy. Does not bruise/bleed easily.  Psychiatric/Behavioral: Negative.     Objective:  BP 140/70 (BP Location: Left Arm, Patient Position: Sitting, Cuff Size: Large)   Pulse 75   Temp 98.4 F (36.9 C) (Oral)   Resp 16   Ht 5\' 2"  (1.575 m)  Wt 222 lb 4 oz (100.8 kg)   SpO2 93%   BMI 40.65 kg/m   BP Readings from Last 3 Encounters:  05/24/20 140/70  04/10/20 134/68  03/30/20 (!) 148/76    Wt Readings from Last 3 Encounters:  05/24/20 222 lb 4 oz (100.8 kg)  04/10/20 221 lb (100.2 kg)  03/30/20 221 lb 12.8 oz (100.6 kg)    Physical Exam Vitals reviewed.  Constitutional:      Appearance: She is obese. She is not ill-appearing or diaphoretic.  HENT:     Nose: Nose normal.     Mouth/Throat:     Mouth: Mucous membranes are moist.  Eyes:      General: No scleral icterus.    Conjunctiva/sclera: Conjunctivae normal.  Cardiovascular:     Rate and Rhythm: Normal rate and regular rhythm.     Heart sounds: No murmur heard.   Pulmonary:     Effort: Pulmonary effort is normal.     Breath sounds: No stridor. No wheezing, rhonchi or rales.  Chest:     Chest wall: Tenderness present. No mass, deformity, swelling or crepitus.    Abdominal:     General: Abdomen is protuberant. Bowel sounds are normal. There is no distension.     Palpations: Abdomen is soft.     Tenderness: There is no abdominal tenderness.  Musculoskeletal:     Right shoulder: Normal.     Left shoulder: Normal. No swelling, deformity, tenderness or bony tenderness. Normal range of motion.     Right upper arm: Normal. No swelling.     Left upper arm: Tenderness present. No swelling, edema, deformity or bony tenderness.       Arms:     Cervical back: Neck supple.  Lymphadenopathy:     Cervical: No cervical adenopathy.  Skin:    General: Skin is warm and dry.     Findings: No rash.  Neurological:     General: No focal deficit present.     Mental Status: She is alert.     Lab Results  Component Value Date   WBC 10.8 (H) 04/10/2020   HGB 12.8 04/10/2020   HCT 39.9 04/10/2020   PLT 153.0 04/10/2020   GLUCOSE 104 (H) 04/10/2020   CHOL 186 04/10/2020   TRIG 110.0 04/10/2020   HDL 57.90 04/10/2020   LDLDIRECT 121.0 06/12/2017   LDLCALC 106 (H) 04/10/2020   ALT 8 04/10/2020   AST 14 04/10/2020   NA 142 04/10/2020   K 4.3 04/10/2020   CL 100 04/10/2020   CREATININE 0.78 04/10/2020   BUN 16 04/10/2020   CO2 36 (H) 04/10/2020   TSH 3.85 04/10/2020   INR 1.03 08/25/2018   HGBA1C 5.0 08/26/2018    DG Chest 2 View  Result Date: 07/13/2019 CLINICAL DATA:  Chronic chest pain. EXAM: CHEST - 2 VIEW COMPARISON:  Chest radiograph 02/09/2019 FINDINGS: Stable cardiomegaly. Tortuosity of the thoracic aorta. Pulmonary vascular redistribution. Mild interstitial  pulmonary opacities. No pleural effusion or pneumothorax. Thoracic spine degenerative changes. IMPRESSION: Cardiomegaly and mild interstitial edema. Electronically Signed   By: Lovey Newcomer M.D.   On: 07/13/2019 16:33    DG Ribs Unilateral Left  Result Date: 05/24/2020 CLINICAL DATA:  Left rib pain after fall today. EXAM: LEFT RIBS - 2 VIEW COMPARISON:  July 13, 2019. FINDINGS: No fracture or other bone lesions are seen involving the ribs. IMPRESSION: Negative. Electronically Signed   By: Marijo Conception M.D.   On: 05/24/2020 13:50  DG Humerus Left  Result Date: 05/24/2020 CLINICAL DATA:  Left arm pain after fall today. EXAM: LEFT HUMERUS - 2+ VIEW COMPARISON:  August 05, 2017. FINDINGS: There is no evidence of fracture or other focal bone lesions. Soft tissues are unremarkable. IMPRESSION: Negative. Electronically Signed   By: Marijo Conception M.D.   On: 05/24/2020 13:51    Assessment & Plan:   Jakyiah was seen today for fall.  Diagnoses and all orders for this visit:  Fall, initial encounter- Based on her symptoms, exam, and x-ray she has several contusions.  She is not getting much symptom relief with an NSAID so I recommended a brief course of hydrocodone and acetaminophen for the pain. -     DG Ribs Unilateral Left; Future -     DG Humerus Left; Future  Left-sided chest wall pain -     DG Ribs Unilateral Left; Future -     HYDROcodone-acetaminophen (NORCO/VICODIN) 5-325 MG tablet; Take 1 tablet by mouth every 6 (six) hours as needed for moderate pain.  Left upper arm pain -     DG Humerus Left; Future -     HYDROcodone-acetaminophen (NORCO/VICODIN) 5-325 MG tablet; Take 1 tablet by mouth every 6 (six) hours as needed for moderate pain.   I am having Dorlisa H. Hanratty start on HYDROcodone-acetaminophen. I am also having her maintain her Cholecalciferol, hydrALAZINE, carvedilol, spironolactone, ipratropium-albuterol, isosorbide mononitrate, albuterol, omeprazole, Myrbetriq, torsemide,  modafinil, clonazePAM, and traZODone.  Meds ordered this encounter  Medications  . HYDROcodone-acetaminophen (NORCO/VICODIN) 5-325 MG tablet    Sig: Take 1 tablet by mouth every 6 (six) hours as needed for moderate pain.    Dispense:  20 tablet    Refill:  0     Follow-up: No follow-ups on file.  Scarlette Calico, MD

## 2020-05-25 ENCOUNTER — Encounter: Payer: Self-pay | Admitting: Internal Medicine

## 2020-05-25 NOTE — Patient Instructions (Signed)

## 2020-06-04 DIAGNOSIS — G4733 Obstructive sleep apnea (adult) (pediatric): Secondary | ICD-10-CM | POA: Diagnosis not present

## 2020-06-08 ENCOUNTER — Telehealth: Payer: Self-pay | Admitting: Pharmacist

## 2020-06-08 ENCOUNTER — Other Ambulatory Visit: Payer: Self-pay | Admitting: Internal Medicine

## 2020-06-08 DIAGNOSIS — N3281 Overactive bladder: Secondary | ICD-10-CM

## 2020-06-08 MED ORDER — SOLIFENACIN SUCCINATE 10 MG PO TABS: 10.0000 mg | ORAL_TABLET | Freq: Every day | ORAL | 1 refills | Status: DC

## 2020-06-08 NOTE — Telephone Encounter (Signed)
Received call from patient regarding Myrbetriq cost - it has gone up to $109 per month in the donut hole. Pt reports it is not helping very much anyway, she gets up 2-3 times per night to urinate. She is interested in switching to a cheaper option. Of note patient would not qualify for Myrbetriq patient assistance.  Insurance formulary options: Solifenacin (Vesicare)  - $8/month, $0/mail order Oxybutynin IR - $8/month, $0/mail order Oxybutynin ER - $45/month (<$10 in donut hole)  Tolterodine (Detrol) - nonpreferred, $95/month Darifenacin - nonpreferred, $95/month Gemtesa - nonpreferred, $95/month Trospium - not covered   Plan: Recommend switching Myrbetriq to Solifenacin 5 mg daily for cost savings

## 2020-06-08 NOTE — Telephone Encounter (Signed)
Informed patient of new medication Vesicare 10 mg. Pt may take Vesicare with Myrbetriq until her supply runs out. Pt voiced understanding.

## 2020-06-15 ENCOUNTER — Telehealth: Payer: Self-pay | Admitting: Internal Medicine

## 2020-06-15 NOTE — Telephone Encounter (Signed)
   1.Medication Requested:HYDROcodone-acetaminophen (NORCO/VICODIN) 5-325 MG tablet  2. Pharmacy (Name, Street, [place]):[facility] [phone] - [place], [place] - [address] AT [address]  3. On Med List: yes  4. Last Visit with PCP: 05/24/20  5. Next visit date with PCP: 10/11/20   Agent: Please be advised that RX refills may take up to 3 business days. We ask that you follow-up with your pharmacy.

## 2020-06-17 ENCOUNTER — Other Ambulatory Visit: Payer: Self-pay | Admitting: Internal Medicine

## 2020-06-17 DIAGNOSIS — M150 Primary generalized (osteo)arthritis: Secondary | ICD-10-CM

## 2020-06-17 DIAGNOSIS — M79622 Pain in left upper arm: Secondary | ICD-10-CM

## 2020-06-17 DIAGNOSIS — R0789 Other chest pain: Secondary | ICD-10-CM

## 2020-06-17 DIAGNOSIS — M159 Polyosteoarthritis, unspecified: Secondary | ICD-10-CM

## 2020-06-17 MED ORDER — HYDROCODONE-ACETAMINOPHEN 5-325 MG PO TABS: 1.0000 | ORAL_TABLET | Freq: Four times a day (QID) | ORAL | 0 refills | Status: DC | PRN

## 2020-06-30 ENCOUNTER — Other Ambulatory Visit: Payer: Self-pay

## 2020-06-30 ENCOUNTER — Ambulatory Visit: Payer: Medicare HMO

## 2020-06-30 DIAGNOSIS — E785 Hyperlipidemia, unspecified: Secondary | ICD-10-CM

## 2020-06-30 DIAGNOSIS — F411 Generalized anxiety disorder: Secondary | ICD-10-CM

## 2020-06-30 DIAGNOSIS — I5032 Chronic diastolic (congestive) heart failure: Secondary | ICD-10-CM

## 2020-06-30 DIAGNOSIS — I10 Essential (primary) hypertension: Secondary | ICD-10-CM

## 2020-06-30 DIAGNOSIS — N3281 Overactive bladder: Secondary | ICD-10-CM

## 2020-06-30 NOTE — Patient Instructions (Addendum)
Visit Information  Phone number for Pharmacist: [phone]  Goals Addressed            This Visit's Progress   . Pharmacy Care Plan       CARE PLAN ENTRY  Current Barriers:  . Chronic Disease Management support, education, and care coordination needs related to Hypertension, Hyperlipidemia, Heart Failure, and Insomnia , Overactive bladder   Hypertension/Heart Failure BP Readings from Last 3 Encounters:  04/10/20 134/68  03/30/20 (!) 148/76  03/09/20 132/62 .  Pharmacist Clinical Goal(s): o Over the next 90 days, patient will work with PharmD and providers to maintain BP goal <140/90 . Current regimen:  o carvedilol 12.5 mg twice a day  o hydralazine 50 mg twice a day  o isosorbide [place] 20 mg daily,  o spironolactone 12.5 mg (1/2 of 25 mg) daily,  o torsemide 20 mg daily . Interventions: o Discussed BP goal and benefits of medications for maintaining BP in target range o Discussed low blood pressure (less than 110/60) can cause dizziness o Counseled to stand slowly and ensure steadiness before moving to prevent falls . Patient self care activities - Over the next 90 days, patient will: o Check BP daily, document, and provide at future appointments o Ensure daily salt intake < 2300 mg/day o Stand slowly to prevent falls  Hyperlipidemia Lab Results  Component Value Date   LDLCALC 106 (H) 04/10/2020   LDLDIRECT 121.0 06/12/2017 .  Pharmacist Clinical Goal(s): o Over the next 90 days, patient will work with PharmD and providers to maintain LDL goal < 130 . Current regimen:  o No medications . Interventions: o Discussed cholesterol goals and consequences of high cholesterol . Patient self care activities - Over the next 90 days, patient will: o Reduce cholesterol in diet  Insomnia / Anxiety . Pharmacist Clinical Goal(s) o Over the next 90 days, patient will work with PharmD and providers to optimize therapy . Current regimen:  o Trazodone 150 mg at  bedtime o Clonazepam 1 mg twice daily . Interventions: o Discussed alternative medications for anxiety that may also help with fatigue; ultimately decided to keep same medications and follow up with PCP . Patient self care activities - Over the next 90 days, patient will: o Take medications as prescribed o Follow up with PCP as scheduled  Overactive bladder . Pharmacist Clinical Goal(s) o Over the next 90 days, patient will work with PharmD and providers to optimize therapy . Current regimen:  o Myrbetriq 50 mg daily o Vesicare 10 mg daily . Interventions: o Discussed using medications together may improve symptoms more than either alone . Patient self care activities - Over the next 90 days, patient will: o Continue medications as prescribed  Medication management . Pharmacist Clinical Goal(s): o Over the next 90 days, patient will work with PharmD and providers to achieve optimal medication adherence . Current pharmacy: [facility] . Interventions o Comprehensive medication review performed. o Switch to [facility] order pharmacy for lower copays . Patient self care activities - Over the next 90 days, patient will: o Focus on medication adherence by fill date o Take medications as prescribed o Report any questions or concerns to PharmD and/or provider(s)  Please see past updates related to this goal by clicking on the "Past Updates" button in the selected goal       Patient verbalizes understanding of instructions provided today.   Telephone follow up appointment with pharmacy team member scheduled for: 3 months  Charlene Brooke, PharmD Clinical  Pharmacist [place] [facility] [phone]  Quality Sleep Information, Adult Quality sleep is important for your mental and physical health. It also improves your quality of life. Quality sleep means you:  Are asleep for most of the time you are in bed.  Fall asleep within 30 minutes.  Wake up no more  than once a night.  Are awake for no longer than 20 minutes if you do wake up during the night. Most adults need 7-8 hours of quality sleep each night. How can poor sleep affect me? If you do not get enough quality sleep, you may have:  Mood swings.  Daytime sleepiness.  Confusion.  Decreased reaction time.  Sleep disorders, such as insomnia and sleep apnea.  Difficulty with: ? Solving problems. ? Coping with stress. ? Paying attention. These issues may affect your performance and productivity at work, school, and at home. Lack of sleep may also put you at higher risk for accidents, suicide, and risky behaviors. If you do not get quality sleep you may also be at higher risk for several health problems, including:  Infections.  Type 2 diabetes.  Heart disease.  High blood pressure.  Obesity.  Worsening of long-term conditions, like arthritis, kidney disease, depression, Parkinson's disease, and epilepsy. What actions can I take to get more quality sleep?      Stick to a sleep schedule. Go to sleep and wake up at about the same time each day. Do not try to sleep less on weekdays and make up for lost sleep on weekends. This does not work.  Try to get about 30 minutes of exercise on most days. Do not exercise 2-3 hours before going to bed.  Limit naps during the day to 30 minutes or less.  Do not use any products that contain nicotine or tobacco, such as cigarettes or e-cigarettes. If you need help quitting, ask your health care provider.  Do not drink caffeinated beverages for at least 8 hours before going to bed. Coffee, tea, and some sodas contain caffeine.  Do not drink alcohol close to bedtime.  Do not eat large meals close to bedtime.  Do not take naps in the late afternoon.  Try to get at least 30 minutes of sunlight every day. Morning sunlight is best.  Make time to relax before bed. Reading, listening to music, or taking a hot bath promotes quality  sleep.  Make your bedroom a place that promotes quality sleep. Keep your bedroom dark, quiet, and at a comfortable room temperature. Make sure your bed is comfortable. Take out sleep distractions like TV, a computer, smartphone, and bright lights.  If you are lying awake in bed for longer than 20 minutes, get up and do a relaxing activity until you feel sleepy.  Work with your health care provider to treat medical conditions that may affect sleeping, such as: ? Nasal obstruction. ? Snoring. ? Sleep apnea and other sleep disorders.  Talk to your health care provider if you think any of your prescription medicines may cause you to have difficulty falling or staying asleep.  If you have sleep problems, talk with a sleep consultant. If you think you have a sleep disorder, talk with your health care provider about getting evaluated by a specialist. Where to find more information  [place] website: https://sleepfoundation.org  National Heart, Lung, and [place] ([place]): http://www.saunders.info/.pdf  Centers for Disease Control and Prevention (CDC): LearningDermatology.pl Contact a health care provider if you:  Have trouble  getting to sleep or staying asleep.  Often wake up very early in the morning and cannot get back to sleep.  Have daytime sleepiness.  Have daytime sleep attacks of suddenly falling asleep and sudden muscle weakness (narcolepsy).  Have a tingling sensation in your legs with a strong urge to move your legs (restless legs syndrome).  Stop breathing briefly during sleep (sleep apnea).  Think you have a sleep disorder or are taking a medicine that is affecting your quality of sleep. Summary  Most adults need 7-8 hours of quality sleep each night.  Getting enough quality sleep is an important part of health and well-being.  Make your bedroom a place that promotes quality sleep and avoid things that may cause  you to have poor sleep, such as alcohol, caffeine, smoking, and large meals.  Talk to your health care provider if you have trouble falling asleep or staying asleep. This information is not intended to replace advice given to you by your health care provider. Make sure you discuss any questions you have with your health care provider. Document Revised: 02/18/2018 Document Reviewed: 02/18/2018 Elsevier Patient Education  [place].

## 2020-06-30 NOTE — Chronic Care Management (AMB) (Signed)
Chronic Care Management Pharmacy  Name: [name]  MRN: [id number] DOB: 1932/12/01  Chief Complaint/ HPI  [name],  86 y.o. , female presents for their Initial CCM visit with the clinical pharmacist via telephone due to COVID-19 Pandemic.  PCP : [name], MD  Their chronic conditions include: HTN, HLD, diastolic HF, OSA, GERD, IBS, dementia, osteoarthritis, OAB, depression/anxiety  Pt doesn't think she needs all the meds she's on. On CPAP now, still lacking energy, but decreased anxiety Trazodone helps with sleep, she thinks it is sapping her energy in the AM though  Office Visits: 05/24/20 Dr Ronnald Ramp OV: ground level fall, rx'd short course of Norco. Xrays negative for fracture  04/10/20 Dr Ronnald Ramp OV: started Nuvigil 50 mg - 2 tabs daily for fatigue. Needed PA, switched to modafinil 100 mg.  07/13/19 Dr Ronnald Ramp OV: SOB, nonproductive cough c/w pulmonary edema. Restarted torsemide. Reduced gabapentin to 100 mg BID.   Consult Visit: 03/30/20 Dr Harrell Gave (cardiology): daily weights, continue same meds. Pt asking about stimulant like Nuvigil, told to discuss with PCP.  03/09/20 Dr Gala Murdoch (pulmonary): 100% compliance with CPAP. Multiple awakenings d/t diurietic  02/29/20 Dr Georgina Snell (sports med): hip pain, injections and hydrocodone short course (#5).  12/20/19 Dr Ander Slade (pulmonary): moderate OSA, recommended CPAP.    11/10/19 Dr Ander Slade (pulmonary): poor sleep, using a nebulizer but does not have dx of COPD or asthma. Rec'd home sleep study.  11/03/19 Dr Harrell Gave (cardiology): continue daily weights, continue same meds. BP elevated in office but controlled at home.  11/02/19 Dr Georgina Snell (sports medicine): hip bursitis, pain improved by steroid injection. Doing Home PT.   09/09/19 [place] [facility]): torsemide 20 mg daily, take 1 extra dose if 3lb wt gain in 1 day or 5lbs in 1 wk. Increase exercise.   08/10/19 [facility] ([facility]): Reduce hydralazine to BID  and add back spironolactone 12.5 mg at noon. Check BP BID x 1 month.  07/15/20 Dr Harrell Gave (cardiology): acute exacerbation, increased carvedilol to 12.5 mg BID, start spironolactone 25 mg, PharmD clinic   Allergies  Allergen Reactions  . Amlodipine Other (See Comments)    Reaction:  Dizziness   . Enalapril Cough  . Losartan Potassium Other (See Comments)    Reaction:  GI pain   . Morphine And Related Itching and Nausea And Vomiting  . Metformin Diarrhea    Medications: Outpatient Encounter Medications as of 06/30/2020  Medication Sig Note  . albuterol (PROVENTIL) (2.5 MG/3ML) 0.083% nebulizer solution INHALE 3 MLS(2.5 MG TOTAL) BY NEBULIZATION TWICE DAILY AS NEEDED FOR WHEEZING OR SHORTNESS OF BREATH   . carvedilol (COREG) 12.5 MG tablet Take 1 tablet (12.5 mg total) by mouth 2 (two) times daily with a meal.   . Cholecalciferol 2000 units TABS Take 2 tablets (4,000 Units total) by mouth daily.   . clonazePAM (KLONOPIN) 1 MG tablet TAKE 1 TABLET(1 MG) BY MOUTH TWICE DAILY AS NEEDED FOR ANXIETY   . hydrALAZINE (APRESOLINE) 50 MG tablet Take 1 tablet (50 mg total) by mouth 4 (four) times daily. (Patient taking differently: Take 50 mg by mouth 2 (two) times daily. Take one tablet twice a day.) 08/25/2019: Taking BID   . HYDROcodone-acetaminophen (NORCO/VICODIN) 5-325 MG tablet Take 1 tablet by mouth every 6 (six) hours as needed for moderate pain.   Marland Kitchen ipratropium-albuterol (DUONEB) 0.5-2.5 (3) MG/3ML SOLN SMARTSIG:1 Unit(s) By Mouth Twice Daily   . isosorbide mononitrate (ISMO) 20 MG tablet Take 20 mg by mouth daily.   Marland Kitchen  omeprazole (PRILOSEC) 40 MG capsule TAKE 1 CAPSULE(40 MG) BY MOUTH DAILY   . solifenacin (VESICARE) 10 MG tablet Take 1 tablet (10 mg total) by mouth daily.   Marland Kitchen spironolactone (ALDACTONE) 25 MG tablet Take 1 tablet (25 mg total) by mouth daily. (Patient taking differently: Take 12.5 mg by mouth daily. )   . torsemide (DEMADEX) 20 MG tablet Take 1 tablet (20 mg total) by  mouth daily.   . traZODone (DESYREL) 150 MG tablet Take 1 tablet (150 mg total) by mouth at bedtime.   . [DISCONTINUED] omeprazole (PRILOSEC) 40 MG capsule Take 1 capsule (40 mg total) by mouth daily.    No facility-administered encounter medications on file as of 06/30/2020.     Current Diagnosis/Assessment:   Goals Addressed   None     Heart Failure/Hypertension   Type: Diastolic  Last ejection fraction: 60-65% (07/22/2019)  BP goal: < 140/90  Office blood pressures are  BP Readings from Last 3 Encounters:  05/24/20 140/70  04/10/20 134/68  03/30/20 (!) 148/76   Pulse Readings from Last 3 Encounters:  05/24/20 75  04/10/20 64  03/30/20 67   Kidney Function Lab Results  Component Value Date/Time   CREATININE 0.78 04/10/2020 02:03 PM   CREATININE 0.71 08/10/2019 04:27 PM   GFR 84.90 04/10/2020 02:03 PM   GFRNONAA 78 08/10/2019 04:27 PM   GFRAA 90 08/10/2019 04:27 PM   Patient checks BP at home daily  Patient home BP readings are ranging: 130s-150/60s-70s  Patient has failed these meds in past: amlodipine, enalapril/HCTZ, furosemide, losartan, chlorthalidone  Patient is currently controlled on the following medications:   carvedilol 12.5 mg BID,   hydralazine 50 mg BID,   isosorbide MN 20 mg daily,   spironolactone 12.5 mg (1/2 of 25 mg) daily,   torsemide 20 mg daily  We discussed: Pt thinks she is taking too many medications; discussed BP goal, and if medication is removed her BP would likely rise well above goal. Pt does report dizziness upon standing sometimes; discussed standing slowly and using support (ie, cane) to move around to prevent falls. Also stressed adequate hydration.   Plan  Continue current medications and control with diet and exercise   Hyperlipidemia   LDL goal < 130  Lipid Panel     Component Value Date/Time   CHOL 186 04/10/2020 1403   TRIG 110.0 04/10/2020 1403   HDL 57.90 04/10/2020 1403   CHOLHDL 3 04/10/2020 1403    VLDL 22.0 04/10/2020 1403   LDLCALC 106 (H) 04/10/2020 1403   LDLDIRECT 121.0 06/12/2017 1349   ASCVD 10-year risk: n/a due to age   Patient has failed these meds in past: pravastatin Patient is currently controlled on the following medications: no meds  We discussed:  Diet and exercise; no statin indicated due to age.  Plan  Continue control with diet and exercise   Overactive bladder   Patient has failed these meds in past: Vesicare Patient is currently controlled on the following medications:   Vesicare 10 mg daily  Myrbetriq 50 mg daily  We discussed:  Pt started Vesicare last month - she reports it was not working alone, but does works better combined with Myrbetriq. Pt is willing to "pay the difference" to keep using Myrbetriq since she is getting up to urinate <2 times per night now.  Plan  Continue current medications   Anxiety/depression   Patient has failed these meds in past: gabapentin, duloxetine, hydroxyzine, alprazolam Patient is currently controlled on the  following medications:   clonazepam 1 mg BID prn,   trazodone 100 mg HS prn  We discussed:  Pt reports grogginess in AM; she sleeps well through the night with trazodone, and reports she has "experimented" with trazodone dose so she does not think trazodone is contributing to AM grogginess.  Discussed potential alternatives for anxiety that may also help with fatigue - bupropion may worsen anxiety, and venlafaxine may worsen blood pressure; an SSRI may improve sleep/wake cycle but could potentially worsen somnolence as well depending on how pt reacts. Ultimately decided to continue same medications and f/u with PCP if symptoms persist or worsen.  Plan  Continue clonazepam Recommend increasing trazodone to 150 mg HS  Chronic cough   Patient has failed these meds in past: benzonatate Patient is currently uncontrolled on the following medications:  . Cetirizine 10 mg - PRN  We discussed:  Pt tried  cetirizine for dry unproductive cough at night; she reports it did not help much. Pt may try other OTC antihistamines for marginal benefit.  Plan  Recommend OTC 2nd gen antihistamine (Claritin, Allegra)   Medication Management   Pt uses [place] for all medications Uses 7-day pill box - fills every Saturday Pt endorses 100% compliance  We discussed:  Pt would like to switch to mail order pharmacy for cheaper [place] preferred pharmacy to [facility] mail order    Follow up: 3 month phone visit  Charlene Brooke, PharmD Clinical Pharmacist [facility] [phone]

## 2020-07-04 ENCOUNTER — Other Ambulatory Visit: Payer: Self-pay

## 2020-07-04 NOTE — Patient Outreach (Signed)
[facility]) Care Management  07/04/2020  [name] [birth year] [id number]   Telephone Assessment    Outreach attempt #1 to patient. Spoke with patient who reports she is doing well. She is pleased to share that she rested well last night. She reports that she has started taking her meds at different times and believes this is helping her to rest better at night and not feel so "groggy" during the day. She had med review and pharmacy consultation to discuss her meds last week. Patient states at this time she is going to continue current regimen until she sess PCP in Oct. She voices that she is also switching to mail order pharmacy to help with cost of meds. She reports that appetite remains WNL for her. She eats a small/light breakfast and two other meals throughout the day.She reports that she is going to the bathroom often due to diuretic and denies any edema/swelling. She reports no RN CM needs or concerns at this time.   Medications Reviewed Today    Reviewed by Hayden Pedro, RN (Registered Nurse) on 07/04/20 at [place] List Status: <None>  Medication Order Taking? Sig Documenting Provider Last Dose Status Informant  albuterol (PROVENTIL) (2.5 MG/3ML) 0.083% nebulizer solution [id number] No INHALE 3 MLS(2.5 MG TOTAL) BY NEBULIZATION TWICE DAILY AS NEEDED FOR WHEEZING OR SHORTNESS OF Cherly Beach, MD Taking Active   carvedilol (COREG) 12.5 MG tablet [id number] No Take 1 tablet (12.5 mg total) by mouth 2 (two) times daily with a meal. [name], MD Taking Active   Cholecalciferol 2000 units TABS [id number] No Take 2 tablets (4,000 Units total) by mouth daily. [name], MD Taking Active Self  clonazePAM (KLONOPIN) 1 MG tablet [id number] No TAKE 1 TABLET(1 MG) BY MOUTH TWICE DAILY AS NEEDED FOR ANXIETY [name], MD Taking Active   hydrALAZINE (APRESOLINE) 50 MG tablet [id number] No Take 1 tablet (50 mg total) by mouth 4 (four)  times daily.  Patient taking differently: Take 50 mg by mouth 2 (two) times daily. Take one tablet twice a day.   [name], MD Taking Active            Med Note Iva Lento, [place] Aug 25, 2019 11:28 AM) Taking BID   HYDROcodone-acetaminophen (NORCO/VICODIN) 5-325 MG tablet [id number] No Take 1 tablet by mouth every 6 (six) hours as needed for moderate pain. [name], MD Taking Active   ipratropium-albuterol (DUONEB) 0.5-2.5 (3) MG/3ML Bailey Mech [id number] No SMARTSIG:1 Unit(s) By Mouth Twice Daily [provider] Taking Active   isosorbide mononitrate (ISMO) 20 MG tablet [id number] No Take 20 mg by mouth daily. [provider] Taking Active         Discontinued 12/29/19 1809 (Reorder)   omeprazole (PRILOSEC) 40 MG capsule [id number] No TAKE 1 CAPSULE(40 MG) BY MOUTH DAILY [name], MD Taking Active   solifenacin (VESICARE) 10 MG tablet [id number] No Take 1 tablet (10 mg total) by mouth daily. [name], MD Taking Active   spironolactone (ALDACTONE) 25 MG tablet [id number] No Take 1 tablet (25 mg total) by mouth daily.  Patient taking differently: Take 12.5 mg by mouth daily.    [name], MD Taking Expired 03/30/20 2359   torsemide (DEMADEX) 20 MG tablet [id number] No Take 1 tablet (20 mg total) by mouth daily. [name], MD Taking Active   traZODone (DESYREL) 150 MG tablet [id number] No Take 1 tablet (150 mg  total) by mouth at bedtime. [name], MD Taking Active          [facility] CM Care Plan Problem One     Most Recent Value  Care Plan Problem One Knowledge deficit related to mgmt of chronic conditions  [Patient at risk for falls as evidenced by impaired mobility]  Role Documenting the Problem One Care Management [place] for Problem One Active  [facility] Long Term Goal  Patient will report no hospitlaizations over the next 90 days.  [facility] Long Term Goal Start Date 07/04/20  [facility] Long Term Goal Met Date 07/04/20    Inter[facility] for Problem One Long Term Goal RNCM assessed for any acute issues/sxs. RNCM confirmed routine MD appts in place and no barriers. RNCM reviewed action plan with patient and when to seek medical attention.  [facility] CM Short Term Goal #2  Patient will report delivery of CPAP device to home and will begin using it over the next 30 days.  [facility] CM Short Term Goal #2 Start Date 12/31/19  [facility] CM Short Term Goal #2 Met Date 02/28/20    [facility] CM Care Plan Problem Two     Most Recent Value  Care Plan Problem Two Knowledge deficit related to mgmt of HF.  Role Documenting the Problem Two Care Management Telephonic Coordinator  Care Plan for Problem Two Active  [facility] Long Term Goal patient will have no exacerbations and hospital admissions related to HF over the next 60 days.  [facility] Long Term Goal Start Date 10/29/19  [facility] Long Term Goal Met Date 02/28/20  [facility] CM Short Term Goal #1  Patient will obtain second dose of COVID vaccine within the next 30 days  [facility] CM Short Term Goal #1 Start Date 12/31/19  [facility] CM Short Term Goal #1 Met Date  02/28/20    [facility] CM Care Plan Problem Three     Most Recent Value  Care Plan Problem Three Patient with deconditioning-getting HHPT.  Role Documenting the Problem Three Care Management Telephonic Coordinator  Care Plan for Problem Three Not Active      Plan: RN CM discussed with patient next outreach within the month of October. Patient gave verbal consent and in agreement with RN CM follow up and timeframe. Patient aware that they may contact RN CM sooner for any issues or concerns. RN CM will send quarterly update to PCP.   [name], RN,BSN,CCM [place] Management Telephonic Care Management Coordinator Direct Phone: [phone] Toll Free: [phone] Fax: [phone]

## 2020-07-05 DIAGNOSIS — G4733 Obstructive sleep apnea (adult) (pediatric): Secondary | ICD-10-CM | POA: Diagnosis not present

## 2020-07-06 ENCOUNTER — Ambulatory Visit: Payer: Self-pay

## 2020-07-12 ENCOUNTER — Other Ambulatory Visit: Payer: Self-pay | Admitting: Internal Medicine

## 2020-07-12 DIAGNOSIS — H16223 Keratoconjunctivitis sicca, not specified as Sjogren's, bilateral: Secondary | ICD-10-CM | POA: Diagnosis not present

## 2020-07-12 DIAGNOSIS — H04123 Dry eye syndrome of bilateral lacrimal glands: Secondary | ICD-10-CM | POA: Diagnosis not present

## 2020-07-12 DIAGNOSIS — K219 Gastro-esophageal reflux disease without esophagitis: Secondary | ICD-10-CM

## 2020-07-12 DIAGNOSIS — H524 Presbyopia: Secondary | ICD-10-CM | POA: Diagnosis not present

## 2020-07-18 DIAGNOSIS — G4733 Obstructive sleep apnea (adult) (pediatric): Secondary | ICD-10-CM | POA: Diagnosis not present

## 2020-07-19 ENCOUNTER — Other Ambulatory Visit: Payer: Self-pay | Admitting: Cardiology

## 2020-07-19 DIAGNOSIS — I10 Essential (primary) hypertension: Secondary | ICD-10-CM

## 2020-08-05 DIAGNOSIS — G4733 Obstructive sleep apnea (adult) (pediatric): Secondary | ICD-10-CM | POA: Diagnosis not present

## 2020-08-11 ENCOUNTER — Observation Stay (HOSPITAL_COMMUNITY)
Admission: EM | Admit: 2020-08-11 | Discharge: 2020-08-13 | Disposition: A | Discharge status: Home / Self Care | Payer: Medicare HMO | Attending: Family Medicine | Admitting: Family Medicine

## 2020-08-11 ENCOUNTER — Emergency Department (HOSPITAL_COMMUNITY): Payer: Medicare HMO

## 2020-08-11 ENCOUNTER — Observation Stay (HOSPITAL_COMMUNITY): Payer: Medicare HMO

## 2020-08-11 ENCOUNTER — Encounter (HOSPITAL_COMMUNITY): Payer: Self-pay

## 2020-08-11 ENCOUNTER — Other Ambulatory Visit: Payer: Self-pay

## 2020-08-11 ENCOUNTER — Ambulatory Visit (HOSPITAL_COMMUNITY)
Admission: EM | Admit: 2020-08-11 | Discharge: 2020-08-11 | Disposition: A | Discharge status: Nursing Home (Includes ICF) | Payer: Medicare HMO | Attending: Emergency Medicine | Admitting: Emergency Medicine

## 2020-08-11 DIAGNOSIS — F0390 Unspecified dementia without behavioral disturbance: Secondary | ICD-10-CM | POA: Diagnosis not present

## 2020-08-11 DIAGNOSIS — R0902 Hypoxemia: Secondary | ICD-10-CM | POA: Diagnosis not present

## 2020-08-11 DIAGNOSIS — Z20822 Contact with and (suspected) exposure to covid-19: Secondary | ICD-10-CM | POA: Diagnosis not present

## 2020-08-11 DIAGNOSIS — R410 Disorientation, unspecified: Secondary | ICD-10-CM | POA: Diagnosis not present

## 2020-08-11 DIAGNOSIS — I5032 Chronic diastolic (congestive) heart failure: Secondary | ICD-10-CM | POA: Insufficient documentation

## 2020-08-11 DIAGNOSIS — I160 Hypertensive urgency: Principal | ICD-10-CM | POA: Insufficient documentation

## 2020-08-11 DIAGNOSIS — R29810 Facial weakness: Secondary | ICD-10-CM | POA: Diagnosis not present

## 2020-08-11 DIAGNOSIS — R2990 Unspecified symptoms and signs involving the nervous system: Secondary | ICD-10-CM | POA: Diagnosis present

## 2020-08-11 DIAGNOSIS — I499 Cardiac arrhythmia, unspecified: Secondary | ICD-10-CM | POA: Diagnosis not present

## 2020-08-11 DIAGNOSIS — I110 Hypertensive heart disease with heart failure: Secondary | ICD-10-CM | POA: Insufficient documentation

## 2020-08-11 DIAGNOSIS — R079 Chest pain, unspecified: Secondary | ICD-10-CM | POA: Diagnosis not present

## 2020-08-11 DIAGNOSIS — Z79899 Other long term (current) drug therapy: Secondary | ICD-10-CM | POA: Diagnosis not present

## 2020-08-11 DIAGNOSIS — R0602 Shortness of breath: Secondary | ICD-10-CM | POA: Diagnosis not present

## 2020-08-11 DIAGNOSIS — J189 Pneumonia, unspecified organism: Secondary | ICD-10-CM

## 2020-08-11 DIAGNOSIS — I10 Essential (primary) hypertension: Secondary | ICD-10-CM

## 2020-08-11 DIAGNOSIS — R200 Anesthesia of skin: Secondary | ICD-10-CM | POA: Diagnosis not present

## 2020-08-11 DIAGNOSIS — Z96652 Presence of left artificial knee joint: Secondary | ICD-10-CM | POA: Insufficient documentation

## 2020-08-11 DIAGNOSIS — R42 Dizziness and giddiness: Secondary | ICD-10-CM | POA: Diagnosis not present

## 2020-08-11 DIAGNOSIS — G4733 Obstructive sleep apnea (adult) (pediatric): Secondary | ICD-10-CM

## 2020-08-11 DIAGNOSIS — I739 Peripheral vascular disease, unspecified: Secondary | ICD-10-CM | POA: Diagnosis not present

## 2020-08-11 DIAGNOSIS — E119 Type 2 diabetes mellitus without complications: Secondary | ICD-10-CM | POA: Diagnosis not present

## 2020-08-11 DIAGNOSIS — I6389 Other cerebral infarction: Secondary | ICD-10-CM | POA: Diagnosis not present

## 2020-08-11 LAB — DIFFERENTIAL
Abs Immature Granulocytes: 0.03 10*3/uL (ref 0.00–0.07)
Basophils Absolute: 0 10*3/uL (ref 0.0–0.1)
Basophils Relative: 0 %
Eosinophils Absolute: 0.3 10*3/uL (ref 0.0–0.5)
Eosinophils Relative: 3 %
Immature Granulocytes: 0 %
Lymphocytes Relative: 23 %
Lymphs Abs: 2.5 10*3/uL (ref 0.7–4.0)
Monocytes Absolute: 0.7 10*3/uL (ref 0.1–1.0)
Monocytes Relative: 6 %
Neutro Abs: 7.1 10*3/uL (ref 1.7–7.7)
Neutrophils Relative %: 68 %

## 2020-08-11 LAB — I-STAT CHEM 8, ED
BUN: 12 mg/dL (ref 8–23)
Calcium, Ion: 1.15 mmol/L (ref 1.15–1.40)
Chloride: 100 mmol/L (ref 98–111)
Creatinine, Ser: 0.8 mg/dL (ref 0.44–1.00)
Glucose, Bld: 118 mg/dL — ABNORMAL HIGH (ref 70–99)
HCT: 43 % (ref 36.0–46.0)
Hemoglobin: 14.6 g/dL (ref 12.0–15.0)
Potassium: 3.9 mmol/L (ref 3.5–5.1)
Sodium: 138 mmol/L (ref 135–145)
TCO2: 26 mmol/L (ref 22–32)

## 2020-08-11 LAB — CBC
HCT: 42.3 % (ref 36.0–46.0)
Hemoglobin: 12.9 g/dL (ref 12.0–15.0)
MCH: 28.4 pg (ref 26.0–34.0)
MCHC: 30.5 g/dL (ref 30.0–36.0)
MCV: 93.2 fL (ref 80.0–100.0)
Platelets: 159 10*3/uL (ref 150–400)
RBC: 4.54 MIL/uL (ref 3.87–5.11)
RDW: 13.9 % (ref 11.5–15.5)
WBC: 10.6 10*3/uL — ABNORMAL HIGH (ref 4.0–10.5)
nRBC: 0 % (ref 0.0–0.2)

## 2020-08-11 LAB — COMPREHENSIVE METABOLIC PANEL
ALT: 14 U/L (ref 0–44)
AST: 21 U/L (ref 15–41)
Albumin: 3.7 g/dL (ref 3.5–5.0)
Alkaline Phosphatase: 69 U/L (ref 38–126)
Anion gap: 10 (ref 5–15)
BUN: 11 mg/dL (ref 8–23)
CO2: 28 mmol/L (ref 22–32)
Calcium: 9.6 mg/dL (ref 8.9–10.3)
Chloride: 99 mmol/L (ref 98–111)
Creatinine, Ser: 0.76 mg/dL (ref 0.44–1.00)
GFR calc Af Amer: >60 mL/min (ref >60)
GFR calc non Af Amer: >60 mL/min (ref >60)
Glucose, Bld: 120 mg/dL — ABNORMAL HIGH (ref 70–99)
Potassium: 4.2 mmol/L (ref 3.5–5.1)
Sodium: 137 mmol/L (ref 135–145)
Total Bilirubin: 1 mg/dL (ref 0.3–1.2)
Total Protein: 7 g/dL (ref 6.5–8.1)

## 2020-08-11 LAB — APTT: aPTT: 29 seconds (ref 24–36)

## 2020-08-11 LAB — PROTIME-INR
INR: 1 (ref 0.8–1.2)
Prothrombin Time: 12.9 seconds (ref 11.4–15.2)

## 2020-08-11 LAB — SARS CORONAVIRUS 2 BY RT PCR (HOSPITAL ORDER, PERFORMED IN ~~LOC~~ HOSPITAL LAB): SARS Coronavirus 2: NEGATIVE

## 2020-08-11 LAB — TROPONIN I (HIGH SENSITIVITY): Troponin I (High Sensitivity): 7 ng/L (ref <18)

## 2020-08-11 MED ORDER — ACETAMINOPHEN 325 MG PO TABS
650.0000 mg | ORAL_TABLET | Freq: Four times a day (QID) | ORAL | Status: DC | PRN
  Administered 2020-08-12 – 2020-08-13 (×4): 650 mg via ORAL
  Filled 2020-08-11 (×3): qty 2

## 2020-08-11 MED ORDER — ACETAMINOPHEN 650 MG RE SUPP: 650.0000 mg | Freq: Four times a day (QID) | RECTAL | Status: DC | PRN

## 2020-08-11 MED ORDER — HYDRALAZINE HCL 50 MG PO TABS
50.0000 mg | ORAL_TABLET | Freq: Two times a day (BID) | ORAL | Status: DC
  Administered 2020-08-12 – 2020-08-13 (×4): 50 mg via ORAL
  Filled 2020-08-11 (×2): qty 1
  Filled 2020-08-11 (×2): qty 2

## 2020-08-11 MED ORDER — DARIFENACIN HYDROBROMIDE ER 15 MG PO TB24
15.0000 mg | ORAL_TABLET | Freq: Every day | ORAL | Status: DC
  Filled 2020-08-11: qty 1

## 2020-08-11 MED ORDER — HYDRALAZINE HCL 20 MG/ML IJ SOLN: 10.0000 mg | INTRAMUSCULAR | Status: DC | PRN

## 2020-08-11 MED ORDER — TORSEMIDE 20 MG PO TABS
20.0000 mg | ORAL_TABLET | Freq: Every day | ORAL | Status: DC
  Administered 2020-08-12 – 2020-08-13 (×2): 20 mg via ORAL
  Filled 2020-08-11 (×2): qty 1

## 2020-08-11 MED ORDER — ONDANSETRON HCL 4 MG/2ML IJ SOLN: 4.0000 mg | Freq: Four times a day (QID) | INTRAMUSCULAR | Status: DC | PRN

## 2020-08-11 MED ORDER — ONDANSETRON HCL 4 MG PO TABS: 4.0000 mg | ORAL_TABLET | Freq: Four times a day (QID) | ORAL | Status: DC | PRN

## 2020-08-11 MED ORDER — CLONAZEPAM 0.5 MG PO TABS
1.0000 mg | ORAL_TABLET | Freq: Two times a day (BID) | ORAL | Status: DC | PRN
  Administered 2020-08-12 (×2): 1 mg via ORAL
  Filled 2020-08-11 (×2): qty 2

## 2020-08-11 MED ORDER — SODIUM CHLORIDE 0.9% FLUSH
3.0000 mL | Freq: Once | INTRAVENOUS | Status: AC
  Administered 2020-08-11: 3 mL via INTRAVENOUS

## 2020-08-11 MED ORDER — CARVEDILOL 12.5 MG PO TABS
12.5000 mg | ORAL_TABLET | Freq: Two times a day (BID) | ORAL | Status: DC
  Administered 2020-08-12 – 2020-08-13 (×3): 12.5 mg via ORAL
  Filled 2020-08-11 (×3): qty 1

## 2020-08-11 MED ORDER — ENOXAPARIN SODIUM 40 MG/0.4ML ~~LOC~~ SOLN
40.0000 mg | SUBCUTANEOUS | Status: DC
  Administered 2020-08-12 (×2): 40 mg via SUBCUTANEOUS
  Filled 2020-08-11 (×2): qty 0.4

## 2020-08-11 MED ORDER — ISOSORBIDE MONONITRATE 20 MG PO TABS
20.0000 mg | ORAL_TABLET | Freq: Every day | ORAL | Status: DC
  Administered 2020-08-12 – 2020-08-13 (×2): 20 mg via ORAL
  Filled 2020-08-11 (×2): qty 1

## 2020-08-11 NOTE — ED Notes (Signed)
EMS called

## 2020-08-11 NOTE — ED Notes (Signed)
This nurse was notified of pt c/o SOB, changes to EKG and need to go to ER via EMS. Upon eval, pt visibly SOB, difficulty speaking short sentences; placed on 3L O2, explained to pt and daughter need for pt to go to ER via EMS. Pt demanded to go to bathroom to urinate. Escorted to bathroom. EMS arrived immediately after pt finished urinating.  Patient is being discharged from the Urgent Care and sent to the Emergency Department via EMS. Per Margarette Canada, NP, patient is in need of higher level of care due to SOB, EKG changes, extremity weakness.  Patient is aware and verbalizes understanding of plan of care.  Vitals:   08/11/20 1854  BP: (!) 171/77  Pulse: 73  Resp: (!) 22  Temp: 99.1 F (37.3 C)  SpO2: 94%

## 2020-08-11 NOTE — ED Triage Notes (Signed)
Pt reports her blood pressure at home was high and she checked at the fire department and was 220/166 30 min ago. Pt states she is having shortness of breath for years, today is being worse. Denies chest pain, fever, cough.

## 2020-08-11 NOTE — Consult Note (Signed)
Requesting Physician: Dr. Francia Greaves    Chief Complaint: Shortness of breath dizziness  History obtained from: Patient and Chart   HPI:                                                                                                                                       [name] is a 86 y.o. female with past medical history of type 2 diabetes mellitus, hyperlipidemia, hypertension, anxiety, back pain presents to the emergency department after being sent from urgent care as a code stroke for left-sided numbness.  Has been complaining of increased shortness of breath, dizziness since early this morning.  Around 4 PM patient went with her daughter to operate but was not feeling well and was taken to the fire department where blood pressure was greater than 643 systolic.  Patient requests to go to urgent care and upon evaluation was noted to have left-sided numbness and therefore sent via EMS to [facility], ER for urgent evaluation for stroke. On arrival to [facility], patient NIHSS was 1.  On examination it was unclear as to whether she was normal on the left side or the right side.  Stat CT head was obtained which showed no acute findings.  Blood pressure was 329 systolic.   Date last known well: 9.17.21 Time last known well: Unclear tPA Given: no, unclear LKN and mild symptoms, also less likely stroke NIHSS: 1 Baseline MRS 0    Past Medical History:  Diagnosis Date  . Abdominal pain   . Anemia    NOS  . Anxiety   . Back pain   . Bruises easily   . Chronic diastolic CHF (congestive heart failure) ([place])    a. 06/2013 EF 65-70%.  . Chronic headaches    HISTORY OF   . Depression   . Diabetes mellitus    type II  DIET CONTROLLED  . Diarrhea   . Hepatitis    AGE 30S  . Hyperlipidemia   . Hypertension   . Mild aortic stenosis    a. 06/2013 Echo: EF 65-70%, mod LVH with focal basal hypertrophy, very mild AS.  Marland Kitchen Osteoarthritis   . Oxygen desaturation during sleep    USES 2 LITERS  BEDTIME   VIA CPAP 06/2012 [facility]   . Shortness of breath    WITH EXERTION USES 2 L O2 BEDTIME  . Sleep apnea    CPAP WITH O2 2 LITERS 2013 ([facility])  . Wears dentures   . Wears glasses     Past Surgical History:  Procedure Laterality Date  . ABDOMINAL HYSTERECTOMY    . APPENDECTOMY    . CHOLECYSTECTOMY    . JOINT REPLACEMENT     LEFT KNEE   . KNEE ARTHROPLASTY Right 03/22/2015   Procedure: COMPUTER ASSISTED TOTAL KNEE ARTHROPLASTY;  Surgeon: Marybelle Killings, MD;  Location: [place];  Service: Orthopedics;  Laterality: Right;  .  KNEE ARTHROSCOPY     RIGHT  . LEFT AND RIGHT HEART CATHETERIZATION WITH CORONARY ANGIOGRAM N/A 10/28/2013   Procedure: LEFT AND RIGHT HEART CATHETERIZATION WITH CORONARY ANGIOGRAM;  Surgeon: Blane Ohara, MD;  Location: [facility] CATH LAB;  Service: Cardiovascular;  Laterality: N/A;  . LEFT HEART CATHETERIZATION WITH CORONARY ANGIOGRAM N/A 11/14/2014   Procedure: LEFT HEART CATHETERIZATION WITH CORONARY ANGIOGRAM;  Surgeon: Josue Hector, MD;  Location: [facility] CATH LAB;  Service: Cardiovascular;  Laterality: N/A;  . REVISION TOTAL KNEE ARTHROPLASTY  2011  . ROTATOR CUFF REPAIR  2010  . SHOULDER ARTHROSCOPY  09/17/2012   Procedure: ARTHROSCOPY SHOULDER;  Surgeon: Sharmon Revere, MD;  Location: [place];  Service: Orthopedics;  Laterality: Left;  . TONSILLECTOMY    . TONSILLECTOMY      Family History  Problem Relation Age of Onset  . Cancer Father        prostate cancer  . Heart disease Sister   . Heart disease Brother   . Alcohol abuse Other   . Hypertension Other   . Kidney disease Other   . Mental illness Other   . Emphysema Brother    Social History:  reports that she has never smoked. She has never used smokeless tobacco. She reports that she does not drink alcohol and does not use drugs.  Allergies:  Allergies  Allergen Reactions  . Amlodipine Other (See Comments)    Reaction:  Dizziness   . Enalapril Cough  . Losartan Potassium Other (See Comments)     Reaction:  GI pain   . Morphine And Related Itching and Nausea And Vomiting  . Metformin Diarrhea    Medications:                                                                                                                        I reviewed home medications   ROS:                                                                                                                                     14 systems reviewed and negative except above    Examination:  General: Appears well-developed Psych: Affect appropriate to situation Eyes: No scleral injection HENT: No OP obstrucion Head: Normocephalic.  Cardiovascular: Normal rate and regular rhythm.  Respiratory: Effort normal and breath sounds normal to anterior ascultation GI: Soft.  No distension. There is no tenderness.  Skin: WDI    Neurological Examination Mental Status: Alert, oriented, thought content appropriate.  Speech fluent without evidence of aphasia. Able to follow 3 step commands without difficulty. Cranial Nerves: II: Visual fields grossly normal,  III,IV, VI: ptosis not present, extra-ocular motions intact bilaterally, pupils equal, round, reactive to light and accommodation V,VII: smile symmetric, facial light touch sensation normal bilaterally VIII: hearing normal bilaterally IX,X: uvula rises symmetrically XI: bilateral shoulder shrug XII: midline tongue extension Motor: Right : Upper extremity   5/5    Left:     Upper extremity   5/5  Lower extremity   5/5     Lower extremity   5/5 Tone and bulk:normal tone throughout; no atrophy noted Sensory: Subjectively reduced sensation on the left arm and right leg Deep Tendon Reflexes: 2+ and symmetric throughout Plantars: Right: downgoing   Left: downgoing Cerebellar: normal finger-to-nose, normal rapid alternating movements and normal heel-to-shin test Gait:  normal gait and station     Lab Results: Basic Metabolic Panel: Recent Labs  Lab 08/11/20 1957 08/11/20 2002  NA 137 138  K 4.2 3.9  CL 99 100  CO2 28  --   GLUCOSE 120* 118*  BUN 11 12  CREATININE 0.76 0.80  CALCIUM 9.6  --     CBC: Recent Labs  Lab 08/11/20 1957 08/11/20 2002  WBC 10.6*  --   NEUTROABS 7.1  --   HGB 12.9 14.6  HCT 42.3 43.0  MCV 93.2  --   PLT 159  --     Coagulation Studies: Recent Labs    08/11/20 1957  LABPROT 12.9  INR 1.0    Imaging: DG Chest Port 1 View  Result Date: 08/11/2020 CLINICAL DATA:  Shortness of breath. EXAM: PORTABLE CHEST 1 VIEW COMPARISON:  May 24, 2020 FINDINGS: Subtle opacity in the right base. The heart, hila, mediastinum, pleura, and lungs are otherwise unremarkable. IMPRESSION: Subtle infiltrate in the right base worrisome for pneumonia. Recommend short-term follow-up imaging to ensure resolution. Electronically Signed   By: Dorise Bullion III M.D   On: 08/11/2020 20:14   CT HEAD CODE STROKE WO CONTRAST  Result Date: 08/11/2020 CLINICAL DATA:  Code stroke. 86 year old female hypertensive, dizziness, left arm numbness. EXAM: CT HEAD WITHOUT CONTRAST TECHNIQUE: Contiguous axial images were obtained from the base of the skull through the vertex without intravenous contrast. COMPARISON:  Head CT 08/25/2018, brain MRI 08/26/2018. FINDINGS: Brain: Stable cerebral volume. No midline shift, mass effect, or evidence of intracranial mass lesion. No acute intracranial hemorrhage identified. No ventriculomegaly. Patchy bilateral white matter hypodensity has not significantly changed. Chronic lacunar infarct of the right thalamus is stable. Small chronic right cerebellar infarct is stable. No cortically based acute infarct identified. Vascular: Calcified atherosclerosis at the skull base. No suspicious intracranial vascular hyperdensity. Skull: No acute osseous abnormality identified. Sinuses/Orbits: Chronic right sphenoid sinusitis is  stable. Tympanic cavities and mastoids remain clear. Other: No acute orbit or scalp soft tissue findings. ASPECTS [facility] Stroke Program Early CT Score) Total score (0-10 with 10 being normal): 10 IMPRESSION: 1. No acute cortically based infarct or acute intracranial hemorrhage identified. ASPECTS 10. 2. Stable non contrast CT appearance of chronic small vessel disease. 3. These results were communicated to  Dr. Lorraine Lax at 8:06 pm on 08/11/2020 by text page via the [facility] messaging system. Electronically Signed   By: Genevie Ann M.D.   On: 08/11/2020 20:06     ASSESSMENT AND PLAN  86 year old female with uncontrolled hypertension presenting to urgent care with shortness of breath and dizziness in the setting of elevated blood pressure greater than 973 systolic.  Noted to have left-sided numbness and brought to ED. CT head negative for findings.  MRI brain was performed which showed no acute stroke.  Suspect her symptoms are likely from hypertensive emergency.  Hypertensive emergency -BP goal:  gradually return to normotension -No need for further stroke work-up  Please call neurology if any further questions.     Saw [place] [facility] Pager Number [phone]

## 2020-08-11 NOTE — ED Triage Notes (Signed)
Pt comes via [facility] from [facility], pt started to c/o of dizziness this morning at 10 am that then resolved and and then returned this afternoon around 5pm with L arm numbness, no drift or weakness, LVO -.

## 2020-08-11 NOTE — Code Documentation (Signed)
Responded to Code Stroke called at [facility] for confusion and decreased sensation in L arm, [id number]. Pt arrived at [place], NIH-1 for sensory deficit on L side, CT head negative for acute changes. Plan for MRI.

## 2020-08-11 NOTE — ED Notes (Signed)
Call report given to nurse first [place].

## 2020-08-11 NOTE — ED Notes (Signed)
CBG 116  

## 2020-08-11 NOTE — H&P (Signed)
History and Physical    [name] GHW:[id number] DOB: July 07, 1933 DOA: 08/11/2020  PCP: [name], MD  Patient coming from: Home  I have personally briefly reviewed patient's old medical records in [place]  Chief Complaint: HTN, dizziness, SOB  HPI: SMT. LODER is a 86 y.o. female with medical history significant of HTN, dCHF, mild AS, OSA on CPAP.  Pt had dizziness earlier today associated with mild SOB.  Significantly worse around 5pm.  Took BP at home and it was in the 200s.  Went to local FD for BP check to make sure it was accurate and SBP there was at least 220.  Also having L arm numbness and tingling.  Went to UC who then sent her in to ED for evaluation.   ED Course: Tm 99.1, BP continues to run in the 789F-810F systolic in the ED.  WBC 10.6k.  No subjective fever, cough, chills.  Satting 93% on RA currently though does have increased WOB.  Code stroke called but Dr. Lorraine Lax thinks HTN urgency more likely: says get MRI and if neg then treat BP.  CXR shows RLL infiltrate suspicious for PNA.  Covid neg   Review of Systems: As per HPI, otherwise all review of systems negative.  Past Medical History:  Diagnosis Date  . Abdominal pain   . Anemia    NOS  . Anxiety   . Back pain   . Bruises easily   . Chronic diastolic CHF (congestive heart failure) ([place])    a. 06/2013 EF 65-70%.  . Chronic headaches    HISTORY OF   . Depression   . Diabetes mellitus    type II  DIET CONTROLLED  . Diarrhea   . Hepatitis    AGE 30S  . Hyperlipidemia   . Hypertension   . Mild aortic stenosis    a. 06/2013 Echo: EF 65-70%, mod LVH with focal basal hypertrophy, very mild AS.  Marland Kitchen Osteoarthritis   . Oxygen desaturation during sleep    USES 2 LITERS BEDTIME   [facility]AP [facility]   . Shortness of breath    WITH EXERTION USES 2 L O2 BEDTIME  . Sleep apnea    CPAP WITH O2 2 LITERS 2013 ([facility])  . Wears dentures   . Wears glasses     Past Surgical  History:  Procedure Laterality Date  . ABDOMINAL HYSTERECTOMY    . APPENDECTOMY    . CHOLECYSTECTOMY    . JOINT REPLACEMENT     LEFT KNEE   . KNEE ARTHROPLASTY Right 03/22/2015   Procedure: COMPUTER ASSISTED TOTAL KNEE ARTHROPLASTY;  Surgeon: Marybelle Killings, MD;  Location: [place];  Service: Orthopedics;  Laterality: Right;  . KNEE ARTHROSCOPY     RIGHT  . LEFT AND RIGHT HEART CATHETERIZATION WITH CORONARY ANGIOGRAM N/A 10/28/2013   Procedure: LEFT AND RIGHT HEART CATHETERIZATION WITH CORONARY ANGIOGRAM;  Surgeon: Blane Ohara, MD;  Location: [facility] CATH LAB;  Service: Cardiovascular;  Laterality: N/A;  . LEFT HEART CATHETERIZATION WITH CORONARY ANGIOGRAM N/A 11/14/2014   Procedure: LEFT HEART CATHETERIZATION WITH CORONARY ANGIOGRAM;  Surgeon: Josue Hector, MD;  Location: [facility] CATH LAB;  Service: Cardiovascular;  Laterality: N/A;  . REVISION TOTAL KNEE ARTHROPLASTY  2011  . ROTATOR CUFF REPAIR  2010  . SHOULDER ARTHROSCOPY  09/17/2012   Procedure: ARTHROSCOPY SHOULDER;  Surgeon: Sharmon Revere, MD;  Location: [place];  Service: Orthopedics;  Laterality: Left;  . TONSILLECTOMY    .  TONSILLECTOMY       reports that she has never smoked. She has never used smokeless tobacco. She reports that she does not drink alcohol and does not use drugs.  Allergies  Allergen Reactions  . Amlodipine Other (See Comments)    Reaction:  Dizziness   . Enalapril Cough  . Losartan Potassium Other (See Comments)    Reaction:  GI pain   . Morphine And Related Itching and Nausea And Vomiting  . Metformin Diarrhea    Family History  Problem Relation Age of Onset  . Cancer Father        prostate cancer  . Heart disease Sister   . Heart disease Brother   . Alcohol abuse Other   . Hypertension Other   . Kidney disease Other   . Mental illness Other   . Emphysema Brother      Prior to Admission medications   Medication Sig Start Date End Date Taking? Authorizing Provider  albuterol (PROVENTIL) (2.5  MG/3ML) 0.083% nebulizer solution INHALE 3 MLS(2.5 MG TOTAL) BY NEBULIZATION TWICE DAILY AS NEEDED FOR WHEEZING OR SHORTNESS OF BREATH 12/04/19   [name], MD  carvedilol (COREG) 12.5 MG tablet TAKE 1 TABLET(12.5 MG) BY MOUTH TWICE DAILY WITH A MEAL 07/19/20   [name], MD  Cholecalciferol 2000 units TABS Take 2 tablets (4,000 Units total) by mouth daily. 03/31/18   [name], MD  clonazePAM (KLONOPIN) 1 MG tablet TAKE 1 TABLET(1 MG) BY MOUTH TWICE DAILY AS NEEDED FOR ANXIETY 04/21/20   [name], MD  hydrALAZINE (APRESOLINE) 50 MG tablet Take 1 tablet (50 mg total) by mouth 4 (four) times daily. Patient taking differently: Take 50 mg by mouth 2 (two) times daily. Take one tablet twice a day. 04/15/19   [name], MD  HYDROcodone-acetaminophen (NORCO/VICODIN) 5-325 MG tablet Take 1 tablet by mouth every 6 (six) hours as needed for moderate pain. 06/17/20   [name], MD  ipratropium-albuterol (DUONEB) 0.5-2.5 (3) MG/3ML SOLN SMARTSIG:1 Unit(s) By Mouth Twice Daily 04/22/19   [provider]  isosorbide mononitrate (ISMO) 20 MG tablet Take 20 mg by mouth daily. 04/22/19   [provider]  omeprazole (PRILOSEC) 40 MG capsule TAKE 1 CAPSULE(40 MG) BY MOUTH DAILY 07/12/20   [name], MD  solifenacin (VESICARE) 10 MG tablet Take 1 tablet (10 mg total) by mouth daily. 06/08/20   [name], MD  spironolactone (ALDACTONE) 25 MG tablet Take 1 tablet (25 mg total) by mouth daily. Patient taking differently: Take 12.5 mg by mouth daily.  07/16/19 03/30/20  [name], MD  torsemide (DEMADEX) 20 MG tablet Take 1 tablet (20 mg total) by mouth daily. 04/04/20   [name], MD  traZODone (DESYREL) 150 MG tablet Take 1 tablet (150 mg total) by mouth at bedtime. 05/02/20   [name], MD  omeprazole (PRILOSEC) 40 MG capsule Take 1 capsule (40 mg total) by mouth daily. 06/28/19   [name], MD    Physical Exam: Vitals:   08/11/20  1956  BP: (!) 160/107  Pulse: 80  Resp: 19  SpO2: 93%    Constitutional: NAD, calm, comfortable Eyes: PERRL, lids and conjunctivae normal ENMT: Mucous membranes are moist. Posterior pharynx clear of any exudate or lesions.Normal dentition.  Neck: normal, supple, no masses, no thyromegaly Respiratory: Mildly increased WOB Cardiovascular: Regular rate and rhythm, no murmurs / rubs / gallops. No extremity edema. 2+ pedal pulses. No carotid bruits.  Abdomen: no tenderness, no masses palpated. No hepatosplenomegaly. Bowel sounds positive.  Musculoskeletal: no clubbing / cyanosis. No joint deformity upper and lower extremities. Good ROM, no contractures. Normal muscle tone.  Skin: no rashes, lesions, ulcers. No induration Neurologic: CN 2-12 grossly intact. Sensation intact, DTR normal. Strength 5/5 in all 4.  Psychiatric: Normal judgment and insight. Alert and oriented x 3. Normal mood.    Labs on Admission: I have personally reviewed following labs and imaging studies  CBC: Recent Labs  Lab 08/11/20 1957 08/11/20 2002  WBC 10.6*  --   NEUTROABS 7.1  --   HGB 12.9 14.6  HCT 42.3 43.0  MCV 93.2  --   PLT 159  --    Basic Metabolic Panel: Recent Labs  Lab 08/11/20 1957 08/11/20 2002  NA 137 138  K 4.2 3.9  CL 99 100  CO2 28  --   GLUCOSE 120* 118*  BUN 11 12  CREATININE 0.76 0.80  CALCIUM 9.6  --    GFR: CrCl cannot be calculated (Unknown ideal weight.). Liver Function Tests: Recent Labs  Lab 08/11/20 1957  AST 21  ALT 14  ALKPHOS 69  BILITOT 1.0  PROT 7.0  ALBUMIN 3.7   No results for input(s): LIPASE, AMYLASE in the last 168 hours. No results for input(s): AMMONIA in the last 168 hours. Coagulation Profile: Recent Labs  Lab 08/11/20 1957  INR 1.0   Cardiac Enzymes: No results for input(s): CKTOTAL, CKMB, CKMBINDEX, TROPONINI in the last 168 hours. BNP (last 3 results) No results for input(s): PROBNP in the last 8760 hours. HbA1C: No results for  input(s): HGBA1C in the last 72 hours. CBG: No results for input(s): GLUCAP in the last 168 hours. Lipid Profile: No results for input(s): CHOL, HDL, LDLCALC, TRIG, CHOLHDL, LDLDIRECT in the last 72 hours. Thyroid Function Tests: No results for input(s): TSH, T4TOTAL, FREET4, T3FREE, THYROIDAB in the last 72 hours. Anemia Panel: No results for input(s): VITAMINB12, FOLATE, FERRITIN, TIBC, IRON, RETICCTPCT in the last 72 hours. Urine analysis:    Component Value Date/Time   COLORURINE YELLOW 01/29/2019 1633   APPEARANCEUR CLOUDY (A) 01/29/2019 1633   LABSPEC 1.025 01/29/2019 1633   PHURINE 5.5 01/29/2019 1633   GLUCOSEU NEGATIVE 01/29/2019 1633   GLUCOSEU NEGATIVE 03/30/2018 1358   HGBUR MODERATE (A) 01/29/2019 1633   BILIRUBINUR negative 04/15/2019 [place] 01/29/2019 1633   PROTEINUR Negative 04/15/2019 1634   PROTEINUR TRACE (A) 01/29/2019 1633   UROBILINOGEN 0.2 04/15/2019 1634   UROBILINOGEN 0.2 03/30/2018 1358   NITRITE negative 04/15/2019 1634   NITRITE NEGATIVE 01/29/2019 1633   LEUKOCYTESUR Small (1+) (A) 04/15/2019 1634   LEUKOCYTESUR MODERATE (A) 01/29/2019 1633    Radiological Exams on Admission: DG Chest Port 1 View  Result Date: 08/11/2020 CLINICAL DATA:  Shortness of breath. EXAM: PORTABLE CHEST 1 VIEW COMPARISON:  May 24, 2020 FINDINGS: Subtle opacity in the right base. The heart, hila, mediastinum, pleura, and lungs are otherwise unremarkable. IMPRESSION: Subtle infiltrate in the right base worrisome for pneumonia. Recommend short-term follow-up imaging to ensure resolution. Electronically Signed   By: Dorise Bullion III M.D   On: 08/11/2020 20:14   CT HEAD CODE STROKE WO CONTRAST  Result Date: 08/11/2020 CLINICAL DATA:  Code stroke. 86 year old female hypertensive, dizziness, left arm numbness. EXAM: CT HEAD WITHOUT CONTRAST TECHNIQUE: Contiguous axial images were obtained from the base of the skull through the vertex without intravenous  contrast. COMPARISON:  Head CT 08/25/2018, brain MRI  08/26/2018. FINDINGS: Brain: Stable cerebral volume. No midline shift, mass effect, or evidence of intracranial mass lesion. No acute intracranial hemorrhage identified. No ventriculomegaly. Patchy bilateral white matter hypodensity has not significantly changed. Chronic lacunar infarct of the right thalamus is stable. Small chronic right cerebellar infarct is stable. No cortically based acute infarct identified. Vascular: Calcified atherosclerosis at the skull base. No suspicious intracranial vascular hyperdensity. Skull: No acute osseous abnormality identified. Sinuses/Orbits: Chronic right sphenoid sinusitis is stable. Tympanic cavities and mastoids remain clear. Other: No acute orbit or scalp soft tissue findings. ASPECTS [facility] Stroke Program Early CT Score) Total score (0-10 with 10 being normal): 10 IMPRESSION: 1. No acute cortically based infarct or acute intracranial hemorrhage identified. ASPECTS 10. 2. Stable non contrast CT appearance of chronic small vessel disease. 3. These results were communicated to Dr. Lorraine Lax at 8:06 pm on 08/11/2020 by text page via the [facility] messaging system. Electronically Signed   By: Genevie Ann M.D.   On: 08/11/2020 20:06    EKG: Independently reviewed.  Assessment/Plan Principal Problem:   Hypertensive urgency Active Problems:   Essential hypertension   OSA (obstructive sleep apnea)   Chronic diastolic CHF (congestive heart failure) (HCC)   Stroke-like symptoms    1. Pt with SOB, dizziness, RLL infiltrate, L arm numbness, severe HTN - 1. HTN urgency is most likely diagnosis at this point, RLL CAP still needs to be ruled out. 2. MRI brain is neg for stroke 3. PRN hydralazine ordered 4. Cont home BP meds 5. Trying to determine if there is component of RLL PNA vs just asymmetric edema from CHF due to the HTN urgency: 1. Stat Procalcitonin ordered 2. Stat BNP ordered 3. Repeat CBC in AM 4. Monitor for  fever 5. Will try and treat BP first and see how she responds, unless one of the other findings above come back suggestive of infectious PNA. 6. COVID negative 2. HTN - 1. Home med rec pending 2. MRI brain pending to differentiate stroke vs HTN urgency at this time 3. OSA - 1. Cont home CPAP QHS 4. dCHF - 1. BNP pending 5. Abnormal bone marrow signal of C3-4 on MRI - 1. Follow up C-spine MRI / work up outpt  DVT prophylaxis: Lovenox Code Status: Full Family Communication: Daughter at bedside Disposition Plan: Home after diagnosis obtained, pt treated, and symptoms improved Consults called: Neurology saw as a code stroke Admission status: Place in 94   Gurkaran Rahm, [place] [facility]  How to contact the [facility] Attending or Consulting provider [place] or covering provider during after hours [place], for this patient?  1. Check the care team in [facility] and look for a) attending/consulting [facility] provider listed and b) the [facility] team listed 2. Log into [email or web address]  Amion Physician Scheduling and messaging for groups and whole hospitals  On call and physician scheduling software for group practices, residents, hospitalists and other medical providers for call, clinic, rotation and shift schedules. On[employer] is a hospital-wide system for scheduling doctors and paging doctors on call. EasyPlot is for scientific plotting and data analysis.  [email or web address]  and use [place]'s universal password to access. If you do not have the password, please contact the hospital operator.  3. Locate the [facility] provider you are looking for under [facility] and page to a number that you can be directly reached. 4. If you still have difficulty reaching the provider, please page the [facility] (Director on Call) for the Hospitalists listed on amion  for assistance.  08/11/2020, 9:53 PM

## 2020-08-11 NOTE — ED Provider Notes (Signed)
[facility] EMERGENCY DEPARTMENT Provider Note   CSN: [id number] Arrival date & time: 08/11/20  1947     History Chief Complaint  Patient presents with  . Code Stroke    [name] is a 86 y.o. female.  86 year old female with prior medical history as detailed below presents for evaluation.  Patient was sent from urgent care for evaluation this evening.  Patient reports that earlier today she had dizziness associated with mild shortness of breath.  Symptoms became significantly worse around 5 PM.  She went to a local fire department for a blood pressure check.  She reports that her blood pressure there was at least 220.  At that time she was feeling dizzy.  She is also experiencing shortness of breath with vague chest pressure.  She also was feeling left arm weakness and tingling.  She presented to urgent care who then sent her to the ED for evaluation.  Upon my evaluation she reports feeling improved.  Blood pressure at this time is in the 665L systolic.  Patient reports that her normal BP is in the 935 systolic range.  She denies current chest pain or pressure.  She denies current extremity weakness or tingling.  Code stroke was initiated upon arrival of the patient to the ED.  Patient is not a candidate for TPA.  The history is provided by the patient, medical records and the EMS personnel.  Illness Location:  Dizziness, chest pressure, dyspnea, LUE tingling/weakness  Severity:  Moderate Onset quality:  Sudden Duration:  4 hours Timing:  Rare Progression:  Resolved Chronicity:  New Associated symptoms: chest pain and shortness of breath        Past Medical History:  Diagnosis Date  . Abdominal pain   . Anemia    NOS  . Anxiety   . Back pain   . Bruises easily   . Chronic diastolic CHF (congestive heart failure) ([place])    a. 06/2013 EF 65-70%.  . Chronic headaches    HISTORY OF   . Depression   . Diabetes mellitus    type II  DIET CONTROLLED  .  Diarrhea   . Hepatitis    AGE 30S  . Hyperlipidemia   . Hypertension   . Mild aortic stenosis    a. 06/2013 Echo: EF 65-70%, mod LVH with focal basal hypertrophy, very mild AS.  Marland Kitchen Osteoarthritis   . Oxygen desaturation during sleep    USES 2 LITERS BEDTIME   VIA CPAP 06/2012 [facility]   . Shortness of breath    WITH EXERTION USES 2 L O2 BEDTIME  . Sleep apnea    CPAP WITH O2 2 LITERS 2013 ([facility])  . Wears dentures   . Wears glasses     Patient Active Problem List   Diagnosis Date Noted  . Fall 05/24/2020  . Left-sided chest wall pain 05/24/2020  . Left upper arm pain 05/24/2020  . Excessive somnolence disorder 04/12/2020  . Routine general medical examination at a health care facility 04/10/2020  . Thrombocytopenia ([place]) 04/10/2020  . Anxiety 08/26/2018  . Seizure ([place]) 08/25/2018  . GERD (gastroesophageal reflux disease) 08/25/2018  . Chronic diastolic CHF (congestive heart failure) ([place]) 08/25/2018  . Major depressive disorder, recurrent episode with anxious distress ([place]) 02/04/2017  . Dementia arising in the senium and presenium ([place]) 06/27/2016  . Obesity, Class II, BMI 35-39.9, with comorbidity 05/15/2016  . Insomnia w/ sleep apnea 10/16/2015  . IBS (irritable bowel syndrome) 02/27/2015  .  Hashimoto's thyroiditis 05/05/2014  . Adrenal incidentaloma ([place]) 11/04/2013  . Benign paroxysmal positional vertigo 07/15/2013  . Aortic stenosis, mild 07/14/2013  . OAB (overactive bladder) 02/24/2013  . GERD with stricture 12/24/2012  . Sleep-related hypoventilation 07/30/2012  . OSA (obstructive sleep apnea) 04/16/2012  . Osteopenia 04/16/2012  . Preventative health care 05/24/2011  . Vitamin D deficiency 12/04/2010  . Hyperlipidemia with target LDL less than 130 12/03/2010  . GAD (generalized anxiety disorder) 12/03/2010  . Essential hypertension 12/03/2010  . Osteoarthritis 12/03/2010    Past Surgical History:  Procedure Laterality Date  . ABDOMINAL HYSTERECTOMY     . APPENDECTOMY    . CHOLECYSTECTOMY    . JOINT REPLACEMENT     LEFT KNEE   . KNEE ARTHROPLASTY Right 03/22/2015   Procedure: COMPUTER ASSISTED TOTAL KNEE ARTHROPLASTY;  Surgeon: Marybelle Killings, MD;  Location: [facility];  Service: Orthopedics;  Laterality: Right;  . KNEE ARTHROSCOPY     RIGHT  . LEFT AND RIGHT HEART CATHETERIZATION WITH CORONARY ANGIOGRAM N/A 10/28/2013   Procedure: LEFT AND RIGHT HEART CATHETERIZATION WITH CORONARY ANGIOGRAM;  Surgeon: Blane Ohara, MD;  Location[facility] C[facility] LAB;  Service: Cardiovascular;  Laterality: N/A;  . LEFT HEART CATHETERIZATION WITH CORONARY ANGIOGRAM N/A 11/14/2014   Procedure: LEFT HEART CATHETERIZATION WITH CORONARY ANGIOGRAM;  Surgeon: Josue Hector, MD;  Location[facility] C[facility] LAB;  Service: Cardiovascular;  Laterality: N/A;  . REVISION TOTAL KNEE ARTHROPLASTY  2011  . ROTATOR CUFF REPAIR  2010  . SHOULDER ARTHROSCOPY  09/17/2012   Procedure: ARTHROSCOPY SHOULDER;  Surgeon: Sharmon Revere, MD;  Location: [facility];  Service: Orthopedics;  Laterality: Left;  . TONSILLECTOMY    . TONSILLECTOMY       OB History   No obstetric history on file.     Family History  Problem Relation Age of Onset  . Cancer Father        prostate cancer  . Heart disease Sister   . Heart disease Brother   . Alcohol abuse Other   . Hypertension Other   . Kidney disease Other   . Mental illness Other   . Emphysema Brother     Social History   Tobacco Use  . Smoking status: Never Smoker  . Smokeless tobacco: Never Used  Vaping Use  . Vaping Use: Never used  Substance Use Topics  . Alcohol use: No  . Drug use: No    Home Medications Prior to Admission medications   Medication Sig Start Date End Date Taking? Authorizing Provider  albuterol (PROVENTIL) (2.5 MG/3ML) 0.083% nebulizer solution INHALE 3 MLS(2.5 MG TOTAL) BY NEBULIZATION TWICE DAILY AS NEEDED FOR WHEEZING OR SHORTNESS OF BREATH 12/04/19   [name], MD  carvedilol (COREG) 12.5 MG tablet TAKE 1  TABLET(12.5 MG) BY MOUTH TWICE DAILY WITH A MEAL 07/19/20   [name], MD  Cholecalciferol 2000 units TABS Take 2 tablets (4,000 Units total) by mouth daily. 03/31/18   [name], MD  clonazePAM (KLONOPIN) 1 MG tablet TAKE 1 TABLET(1 MG) BY MOUTH TWICE DAILY AS NEEDED FOR ANXIETY 04/21/20   [name], MD  hydrALAZINE (APRESOLINE) 50 MG tablet Take 1 tablet (50 mg total) by mouth 4 (four) times daily. Patient taking differently: Take 50 mg by mouth 2 (two) times daily. Take one tablet twice a day. 04/15/19   [name], MD  HYDROcodone-acetaminophen (NORCO/VICODIN) 5-325 MG tablet Take 1 tablet by mouth every 6 (six) hours as needed for moderate pain.  06/17/20   [name], MD  ipratropium-albuterol (DUONEB) 0.5-2.5 (3) MG/3ML SOLN SMARTSIG:1 Unit(s) By Mouth Twice Daily 04/22/19   [provider]  isosorbide mononitrate (ISMO) 20 MG tablet Take 20 mg by mouth daily. 04/22/19   [provider]  omeprazole (PRILOSEC) 40 MG capsule TAKE 1 CAPSULE(40 MG) BY MOUTH DAILY 07/12/20   [name], MD  solifenacin (VESICARE) 10 MG tablet Take 1 tablet (10 mg total) by mouth daily. 06/08/20   [name], MD  spironolactone (ALDACTONE) 25 MG tablet Take 1 tablet (25 mg total) by mouth daily. Patient taking differently: Take 12.5 mg by mouth daily.  07/16/19 03/30/20  [name], MD  torsemide (DEMADEX) 20 MG tablet Take 1 tablet (20 mg total) by mouth daily. 04/04/20   [name], MD  traZODone (DESYREL) 150 MG tablet Take 1 tablet (150 mg total) by mouth at bedtime. 05/02/20   [name], MD  omeprazole (PRILOSEC) 40 MG capsule Take 1 capsule (40 mg total) by mouth daily. 06/28/19   [name], MD    Allergies    Amlodipine, Enalapril, Losartan potassium, Morphine and related, and Metformin  Review of Systems   Review of Systems  Respiratory: Positive for shortness of breath.   Cardiovascular: Positive for chest pain.  Neurological:  Positive for weakness.  All other systems reviewed and are negative.   Physical Exam Updated Vital Signs BP (!) 160/107 (BP Location: Right Arm)   Pulse 80   Resp 19   SpO2 93%   Physical Exam Vitals and nursing note reviewed.  Constitutional:      General: She is not in acute distress.    Appearance: Normal appearance. She is well-developed.  HENT:     Head: Normocephalic and atraumatic.  Eyes:     Conjunctiva/sclera: Conjunctivae normal.     Pupils: Pupils are equal, round, and reactive to light.  Cardiovascular:     Rate and Rhythm: Normal rate and regular rhythm.     Heart sounds: Normal heart sounds.  Pulmonary:     Effort: Pulmonary effort is normal. No respiratory distress.     Breath sounds: Normal breath sounds.  Abdominal:     General: There is no distension.     Palpations: Abdomen is soft.     Tenderness: There is no abdominal tenderness.  Musculoskeletal:        General: No deformity. Normal range of motion.     Cervical back: Normal range of motion and neck supple.  Skin:    General: Skin is warm and dry.  Neurological:     General: No focal deficit present.     Mental Status: She is alert and oriented to person, place, and time. Mental status is at baseline.     Cranial Nerves: No cranial nerve deficit.     Sensory: No sensory deficit.     Motor: No weakness.     Coordination: Coordination normal.     ED Results / Procedures / Treatments   Labs (all labs ordered are listed, but only abnormal results are displayed) Labs Reviewed  CBC - Abnormal; Notable for the following components:      Result Value   WBC 10.6 (*)    All other components within normal limits  COMPREHENSIVE METABOLIC PANEL - Abnormal; Notable for the following components:   Glucose, Bld 120 (*)    All other components within normal limits  I-STAT CHEM 8, ED - Abnormal; Notable for the following  components:   Glucose, Bld 118 (*)    All other components within normal limits   SARS CORONAVIRUS 2 BY RT PCR (HOSPITAL ORDER, [facility])  PROTIME-INR  APTT  DIFFERENTIAL  CBG MONITORING, ED  TROPONIN I (HIGH SENSITIVITY)    EKG None  Radiology DG Chest Port 1 View  Result Date: 08/11/2020 CLINICAL DATA:  Shortness of breath. EXAM: PORTABLE CHEST 1 VIEW COMPARISON:  May 24, 2020 FINDINGS: Subtle opacity in the right base. The heart, hila, mediastinum, pleura, and lungs are otherwise unremarkable. IMPRESSION: Subtle infiltrate in the right base worrisome for pneumonia. Recommend short-term follow-up imaging to ensure resolution. Electronically Signed   By: Dorise Bullion III M.D   On: 08/11/2020 20:14   CT HEAD CODE STROKE WO CONTRAST  Result Date: 08/11/2020 CLINICAL DATA:  Code stroke. 86 year old female hypertensive, dizziness, left arm numbness. EXAM: CT HEAD WITHOUT CONTRAST TECHNIQUE: Contiguous axial images were obtained from the base of the skull through the vertex without intravenous contrast. COMPARISON:  Head CT 08/25/2018, brain MRI 08/26/2018. FINDINGS: Brain: Stable cerebral volume. No midline shift, mass effect, or evidence of intracranial mass lesion. No acute intracranial hemorrhage identified. No ventriculomegaly. Patchy bilateral white matter hypodensity has not significantly changed. Chronic lacunar infarct of the right thalamus is stable. Small chronic right cerebellar infarct is stable. No cortically based acute infarct identified. Vascular: Calcified atherosclerosis at the skull base. No suspicious intracranial vascular hyperdensity. Skull: No acute osseous abnormality identified. Sinuses/Orbits: Chronic right sphenoid sinusitis is stable. Tympanic cavities and mastoids remain clear. Other: No acute orbit or scalp soft tissue findings. ASPECTS [facility] Stroke Program Early CT Score) Total score (0-10 with 10 being normal): 10 IMPRESSION: 1. No acute cortically based infarct or acute intracranial hemorrhage identified.  ASPECTS 10. 2. Stable non contrast CT appearance of chronic small vessel disease. 3. These results were communicated to Dr. Lorraine Lax at 8:06 pm on 08/11/2020 by text page via the [facility] messaging system. Electronically Signed   By: Genevie Ann M.D.   On: 08/11/2020 20:06    Procedures Procedures (including critical care time)  Medications Ordered in ED Medications  sodium chloride flush (NS) 0.9 % injection 3 mL (has no administration in time range)    ED Course  I have reviewed the triage vital signs and the nursing notes.  Pertinent labs & imaging results that were available during my care of the patient were reviewed by me and considered in my medical decision making (see chart for details).    MDM Rules/Calculators/A&P                          MDM  Screen complete  [name] was evaluated in Emergency Department on 08/11/2020 for the symptoms described in the history of present illness. She was evaluated in the context of the global COVID-19 pandemic, which necessitated consideration that the patient might be at risk for infection with the SARS-CoV-2 virus that causes COVID-19. Institutional protocols and algorithms that pertain to the evaluation of patients at risk for COVID-19 are in a state of rapid change based on information released by regulatory bodies including the CDC and federal and state organizations. These policies and algorithms were followed during the patient's care in the ED.  She is presenting for evaluation of multiple complaints including dyspnea, chest pressure, left arm weakness and tingling.  Patient seen as a code stroke on arrival to the ED.  Patient did not  meet criteria for TPA.  CT Head without acute findings. Dr. Lorraine Lax requests MR Brain to rule out acute CVA.  Patient would likely benefit from admission for further work-up of suspected hypertensive urgency.  Hospitalist service is aware of case and will evaluate for admission. They are also aware of pending  MR Brain.    Final Clinical Impression(s) / ED Diagnoses Final diagnoses:  Hypertensive urgency    Rx / DC Orders ED Discharge Orders    None       Valarie Merino, MD 08/11/20 2129

## 2020-08-12 ENCOUNTER — Observation Stay (HOSPITAL_COMMUNITY): Payer: Medicare HMO

## 2020-08-12 DIAGNOSIS — I10 Essential (primary) hypertension: Secondary | ICD-10-CM | POA: Diagnosis not present

## 2020-08-12 DIAGNOSIS — J189 Pneumonia, unspecified organism: Secondary | ICD-10-CM | POA: Diagnosis not present

## 2020-08-12 LAB — BASIC METABOLIC PANEL
Anion gap: 10 (ref 5–15)
BUN: 10 mg/dL (ref 8–23)
CO2: 26 mmol/L (ref 22–32)
Calcium: 9.6 mg/dL (ref 8.9–10.3)
Chloride: 105 mmol/L (ref 98–111)
Creatinine, Ser: 0.87 mg/dL (ref 0.44–1.00)
GFR calc Af Amer: >60 mL/min (ref >60)
GFR calc non Af Amer: >60 mL/min (ref >60)
Glucose, Bld: 111 mg/dL — ABNORMAL HIGH (ref 70–99)
Potassium: 4.4 mmol/L (ref 3.5–5.1)
Sodium: 141 mmol/L (ref 135–145)

## 2020-08-12 LAB — CBC
HCT: 41.6 % (ref 36.0–46.0)
Hemoglobin: 12.5 g/dL (ref 12.0–15.0)
MCH: 28.2 pg (ref 26.0–34.0)
MCHC: 30 g/dL (ref 30.0–36.0)
MCV: 93.9 fL (ref 80.0–100.0)
Platelets: 159 10*3/uL (ref 150–400)
RBC: 4.43 MIL/uL (ref 3.87–5.11)
RDW: 14 % (ref 11.5–15.5)
WBC: 9.7 10*3/uL (ref 4.0–10.5)
nRBC: 0 % (ref 0.0–0.2)

## 2020-08-12 LAB — GLUCOSE, CAPILLARY
Glucose-Capillary: 159 mg/dL — ABNORMAL HIGH (ref 70–99)
Glucose-Capillary: 154 mg/dL — ABNORMAL HIGH (ref 70–99)

## 2020-08-12 LAB — BRAIN NATRIURETIC PEPTIDE: B Natriuretic Peptide: 109.4 pg/mL — ABNORMAL HIGH (ref 0.0–100.0)

## 2020-08-12 LAB — TROPONIN I (HIGH SENSITIVITY): Troponin I (High Sensitivity): 6 ng/L (ref <18)

## 2020-08-12 LAB — PROCALCITONIN: Procalcitonin: <0.1 ng/mL

## 2020-08-12 MED ORDER — CHOLECALCIFEROL 50 MCG (2000 UT) PO TABS: 2.0000 | ORAL_TABLET | Freq: Every day | ORAL | Status: DC

## 2020-08-12 MED ORDER — TRAZODONE HCL 50 MG PO TABS
150.0000 mg | ORAL_TABLET | Freq: Every day | ORAL | Status: DC
  Administered 2020-08-12: 150 mg via ORAL
  Filled 2020-08-12: qty 1

## 2020-08-12 MED ORDER — PANTOPRAZOLE SODIUM 40 MG PO TBEC
40.0000 mg | DELAYED_RELEASE_TABLET | Freq: Every day | ORAL | Status: DC
  Administered 2020-08-12 – 2020-08-13 (×2): 40 mg via ORAL
  Filled 2020-08-12 (×2): qty 1

## 2020-08-12 MED ORDER — SPIRONOLACTONE 12.5 MG HALF TABLET
12.5000 mg | ORAL_TABLET | Freq: Every day | ORAL | Status: DC
  Administered 2020-08-13: 12.5 mg via ORAL
  Filled 2020-08-12 (×2): qty 1

## 2020-08-12 MED ORDER — ALBUTEROL SULFATE (2.5 MG/3ML) 0.083% IN NEBU: 2.5000 mg | INHALATION_SOLUTION | Freq: Four times a day (QID) | RESPIRATORY_TRACT | Status: DC | PRN

## 2020-08-12 MED ORDER — MIRABEGRON ER 25 MG PO TB24
50.0000 mg | ORAL_TABLET | Freq: Every day | ORAL | Status: DC
  Administered 2020-08-13: 50 mg via ORAL
  Filled 2020-08-12: qty 2
  Filled 2020-08-12: qty 1

## 2020-08-12 MED ORDER — VITAMIN D 25 MCG (1000 UNIT) PO TABS
4000.0000 [IU] | ORAL_TABLET | Freq: Two times a day (BID) | ORAL | Status: DC
  Administered 2020-08-12 – 2020-08-13 (×3): 4000 [IU] via ORAL
  Filled 2020-08-12 (×3): qty 4

## 2020-08-12 MED ORDER — IPRATROPIUM-ALBUTEROL 0.5-2.5 (3) MG/3ML IN SOLN
3.0000 mL | Freq: Four times a day (QID) | RESPIRATORY_TRACT | Status: DC | PRN
  Administered 2020-08-13: 3 mL via RESPIRATORY_TRACT
  Filled 2020-08-12: qty 3

## 2020-08-12 NOTE — ED Notes (Signed)
Tele  Breakfast Ordered 

## 2020-08-12 NOTE — Progress Notes (Addendum)
PROGRESS NOTE    [name]  ZCH:[id number] DOB: [birth year] DOA: 08/11/2020 PCP: [name], MD    Brief Narrative: This 86 y.o. female with medical history significant of HTN, dCHF, mild AS, OSA on CPAP sent from local urgent care for evaluation of  stroke.  Patient reports having dizziness, associated with left arm numbness found to have BP of 220/160 in the UC.  She was seen by neurology stroke ruled out,  MRI was negative for acute abnormality.  Dizziness and left arm numbness resolved after blood pressure improves.  Chest x-ray shows right lower lobe infiltrate suspicious for pneumonia but no other findings consistent with pneumonia.  Assessment & Plan:   Principal Problem:   Hypertensive urgency Active Problems:   Essential hypertension   OSA (obstructive sleep apnea)   Chronic diastolic CHF (congestive heart failure) (HCC)   Stroke-like symptoms   1. Hypertensive urgency: Pt presented with SOB, dizziness, L arm numbness, severe HTN 220/160.  MRI brain is neg for stroke. CT head negative Continue PRN hydralazine ordered. Resume home BP meds. Neurology consulted, stroke ruled out.  Dizziness and left arm numbness has resolved after blood pressure improves.  2. Right LL infiltrate:    Procalcitonin 0.10,  BNP 109.4 , remains afebrile, no leucocytosis, Monitor for fever,  COVID negative.  will hold off antibiotics for now.   2. HTN - 1. Resumed home meds.  3. OSA - 1. Cont home CPAP QHS.  4. dCHF - Not in exacerbation. 1. BNP 109.4   Echo 8/20 : LVEF 65- 70%  5. Abnormal bone marrow signal of C3-4 on MRI - 1. Follow up C-spine MRI / work up outpt   DVT prophylaxis: Lovenox  code Status: Full code Family Communication: No one at bedside. Disposition Plan: Status is: Observation  The patient remains OBS appropriate and will d/c before 2 midnights.  Dispo: The patient is from: Home              Anticipated d/c is to: Home              Anticipated d/c date  is: 2 days              Patient currently is not medically stable to d/c.    Consultants:   Neurology  Procedures: MRI, echocardiogram Antimicrobials: Anti-infectives (From admission, onward)   None      Subjective: Patient was seen and examined at bedside.  Overnight events noted.  She reports numbness has resolved.  She is on 2 L supplemental oxygen saturating 94%.  Stroke was ruled out.  Objective: Vitals:   08/12/20 1145 08/12/20 1200 08/12/20 1215 08/12/20 1256  BP: 136/66 132/80 120/70 111/70  Pulse: 72 74 69 68  Resp: 19 18 20 18   Temp: 97.6 F (36.4 C)   99.6 F (37.6 C)  TempSrc:    Oral  SpO2: 98% 94% 91% 100%   No intake or output data in the 24 hours ending 08/12/20 1304 There were no vitals filed for this visit.  Examination:  General exam: Appears calm and comfortable.  Alert and oriented x2. Respiratory system: Clear to auscultation. Respiratory effort normal. Cardiovascular system: S1 & S2 heard, RRR. No JVD, murmurs, rubs, gallops or clicks. No pedal edema. Gastrointestinal system: Abdomen is nondistended, soft and nontender. No organomegaly or masses felt. Normal bowel sounds heard. Central nervous system: Alert and oriented. No focal neurological deficits. Extremities:  No edema, no cyanosis, No clubbing. Skin: No rashes, lesions  or ulcers Psychiatry: Judgement and insight appear normal. Mood & affect appropriate.     Data Reviewed: I have personally reviewed following labs and imaging studies  CBC: Recent Labs  Lab 08/11/20 1957 08/11/20 2002  WBC 10.6*  --   NEUTROABS 7.1  --   HGB 12.9 14.6  HCT 42.3 43.0  MCV 93.2  --   PLT 159  --    Basic Metabolic Panel: Recent Labs  Lab 08/11/20 1957 08/11/20 2002  NA 137 138  K 4.2 3.9  CL 99 100  CO2 28  --   GLUCOSE 120* 118*  BUN 11 12  CREATININE 0.76 0.80  CALCIUM 9.6  --    GFR: CrCl cannot be calculated (Unknown ideal weight.). Liver Function Tests: Recent Labs  Lab  08/11/20 1957  AST 21  ALT 14  ALKPHOS 69  BILITOT 1.0  PROT 7.0  ALBUMIN 3.7   No results for input(s): LIPASE, AMYLASE in the last 168 hours. No results for input(s): AMMONIA in the last 168 hours. Coagulation Profile: Recent Labs  Lab 08/11/20 1957  INR 1.0   Cardiac Enzymes: No results for input(s): CKTOTAL, CKMB, CKMBINDEX, TROPONINI in the last 168 hours. BNP (last 3 results) No results for input(s): PROBNP in the last 8760 hours. HbA1C: No results for input(s): HGBA1C in the last 72 hours. CBG: No results for input(s): GLUCAP in the last 168 hours. Lipid Profile: No results for input(s): CHOL, HDL, LDLCALC, TRIG, CHOLHDL, LDLDIRECT in the last 72 hours. Thyroid Function Tests: No results for input(s): TSH, T4TOTAL, FREET4, T3FREE, THYROIDAB in the last 72 hours. Anemia Panel: No results for input(s): VITAMINB12, FOLATE, FERRITIN, TIBC, IRON, RETICCTPCT in the last 72 hours. Sepsis Labs: Recent Labs  Lab 08/12/20 0111  PROCALCITON <0.10    Recent Results (from the past 240 hour(s))  SARS Coronavirus 2 by RT PCR (hospital order, performed in [facility] hospital lab) Nasopharyngeal Nasopharyngeal Swab     Status: None   Collection Time: 08/11/20  8:57 PM   Specimen: Nasopharyngeal Swab  Result Value Ref Range Status   SARS Coronavirus 2 NEGATIVE NEGATIVE Final    Comment: (NOTE) SARS-CoV-2 target nucleic acids are NOT DETECTED.  The SARS-CoV-2 RNA is generally detectable in upper and lower respiratory specimens during the acute phase of infection. The lowest concentration of SARS-CoV-2 viral copies this assay can detect is 250 copies / mL. A negative result does not preclude SARS-CoV-2 infection and should not be used as the sole basis for treatment or other patient management decisions.  A negative result may occur with improper specimen collection / handling, submission of specimen other than nasopharyngeal swab, presence of viral mutation(s) within  the areas targeted by this assay, and inadequate number of viral copies (<250 copies / mL). A negative result must be combined with clinical observations, patient history, and epidemiological information.  Fact Sheet for Patients:   StrictlyIdeas.no  Fact Sheet for Healthcare Providers: BankingDealers.co.za  This test is not yet approved or  cleared by the [place] FDA and has been authorized for detection and/or diagnosis of SARS-CoV-2 by FDA under an Emergency Use Authorization (EUA).  This EUA will remain in effect (meaning this test can be used) for the duration of the COVID-19 declaration under Section 564(b)(1) of the Act, 21 U.S.C. section 360bbb-3(b)(1), unless the authorization is terminated or revoked sooner.  Performed at [facility], [place] [address], [place], [place] [address]  Radiology Studies: MR BRAIN WO CONTRAST  Result Date: 08/11/2020 CLINICAL DATA:  86 year old female code stroke presentation. Hypertensive, dizziness, left arm numbness. EXAM: MRI HEAD WITHOUT CONTRAST TECHNIQUE: Multiplanar, multiecho pulse sequences of the brain and surrounding structures were obtained without intravenous contrast. COMPARISON:  Head CT earlier tonight.  Brain MRI 08/26/2018. FINDINGS: Brain: No restricted diffusion to suggest acute infarction. No midline shift, mass effect, evidence of mass lesion, ventriculomegaly, extra-axial collection or acute intracranial hemorrhage. Cervicomedullary junction and pituitary are within normal limits. Stable small chronic infarcts in the bilateral cerebellum, right thalamus. Patchy bilateral cerebral white matter T2 and FLAIR hyperintensity has not significantly changed since 2019. No cortical encephalomalacia or chronic cerebral blood products identified. Vascular: Major intracranial vascular flow voids are stable since 2019. Skull and upper cervical spine: Chronic cervical spine  degeneration and evidence of C3-C4 spinal stenosis on series 4, image 12. Conspicuous new decreased T1 marrow signal at the anterior vertebral bodies there (series 4, image 12) since 2019. Otherwise stable visible cervical levels, with normal prevertebral and paraspinal soft tissues. Visible bone marrow signal elsewhere appears stable and within normal limits. Sinuses/Orbits: Stable. Chronic right sphenoid sinusitis. Chronic right maxillary retention cysts. Other: Mastoids remain clear. Visible internal auditory structures appear normal. Scalp and face appear negative. IMPRESSION: 1. No acute intracranial abnormality. Stable chronic small vessel disease since 2019. 2. Unusual appearance of altered bone marrow signal limited to the ventral C3 and C4 vertebral bodies, new since 2019. This is of unclear etiology and significance. Would recommend ensuring the patient is up-to-date on appropriate cancer screening. And otherwise consider follow-up Cervical Spine CT or MRI to attempt further imaging characterization. Electronically Signed   By: Genevie Ann M.D.   On: 08/11/2020 23:26   DG CHEST PORT 1 VIEW  Result Date: 08/12/2020 CLINICAL DATA:  Pneumonia EXAM: PORTABLE CHEST 1 VIEW COMPARISON:  August 11, 2020 FINDINGS: Evaluation is limited secondary to patient rotation the cardiomediastinal silhouette is unchanged in contour. No pleural effusion. No pneumothorax. Scattered bibasilar heterogeneous opacities. Previously described RIGHT lower lung heterogeneous opacity is obscured secondary to patient rotation. Visualized abdomen is unremarkable. Multilevel degenerative changes of the thoracic spine. IMPRESSION: Scattered bibasilar heterogeneous opacities, with differential considerations including atelectasis versus infection. Previously described RIGHT lower lung heterogeneous opacity is obscured secondary to patient rotation. Electronically Signed   By: Valentino Saxon MD   On: 08/12/2020 09:08   DG Chest Port  1 View  Result Date: 08/11/2020 CLINICAL DATA:  Shortness of breath. EXAM: PORTABLE CHEST 1 VIEW COMPARISON:  May 24, 2020 FINDINGS: Subtle opacity in the right base. The heart, hila, mediastinum, pleura, and lungs are otherwise unremarkable. IMPRESSION: Subtle infiltrate in the right base worrisome for pneumonia. Recommend short-term follow-up imaging to ensure resolution. Electronically Signed   By: Dorise Bullion III M.D   On: 08/11/2020 20:14   CT HEAD CODE STROKE WO CONTRAST  Result Date: 08/11/2020 CLINICAL DATA:  Code stroke. 86 year old female hypertensive, dizziness, left arm numbness. EXAM: CT HEAD WITHOUT CONTRAST TECHNIQUE: Contiguous axial images were obtained from the base of the skull through the vertex without intravenous contrast. COMPARISON:  Head CT 08/25/2018, brain MRI 08/26/2018. FINDINGS: Brain: Stable cerebral volume. No midline shift, mass effect, or evidence of intracranial mass lesion. No acute intracranial hemorrhage identified. No ventriculomegaly. Patchy bilateral white matter hypodensity has not significantly changed. Chronic lacunar infarct of the right thalamus is stable. Small chronic right cerebellar infarct is stable. No cortically based acute infarct identified. Vascular: Calcified atherosclerosis at  the skull base. No suspicious intracranial vascular hyperdensity. Skull: No acute osseous abnormality identified. Sinuses/Orbits: Chronic right sphenoid sinusitis is stable. Tympanic cavities and mastoids remain clear. Other: No acute orbit or scalp soft tissue findings. ASPECTS [facility] Stroke Program Early CT Score) Total score (0-10 with 10 being normal): 10 IMPRESSION: 1. No acute cortically based infarct or acute intracranial hemorrhage identified. ASPECTS 10. 2. Stable non contrast CT appearance of chronic small vessel disease. 3. These results were communicated to Dr. Lorraine Lax at 8:06 pm on 08/11/2020 by text page via the [facility] messaging system. Electronically Signed   By:  Genevie Ann M.D.   On: 08/11/2020 20:06    Scheduled Meds: . carvedilol  12.5 mg Oral BID WC  . cholecalciferol  4,000 Units Oral BID  . enoxaparin (LOVENOX) injection  40 mg Subcutaneous Q24H  . hydrALAZINE  50 mg Oral BID  . isosorbide mononitrate  20 mg Oral Daily  . mirabegron ER  50 mg Oral Daily  . pantoprazole  40 mg Oral Daily  . spironolactone  12.5 mg Oral Daily  . torsemide  20 mg Oral Daily  . traZODone  150 mg Oral QHS   Continuous Infusions:   LOS: 0 days    Time spent: 25 mins.    Shawna Clamp, MD [facility]   If 7PM-7AM, please contact night-coverage

## 2020-08-12 NOTE — Progress Notes (Signed)
Rt note. Pt asleep, CPAP placed in room. Told RN to let me know if pt. Wakes up and would like to wear CPAP. Pt. Resting comfortable VS stable.

## 2020-08-12 NOTE — ED Notes (Signed)
Attempted to call report x 1  

## 2020-08-13 DIAGNOSIS — I10 Essential (primary) hypertension: Secondary | ICD-10-CM | POA: Diagnosis not present

## 2020-08-13 LAB — GLUCOSE, CAPILLARY
Glucose-Capillary: 116 mg/dL — ABNORMAL HIGH (ref 70–99)
Glucose-Capillary: 113 mg/dL — ABNORMAL HIGH (ref 70–99)

## 2020-08-13 LAB — MAGNESIUM: Magnesium: 1.6 mg/dL — ABNORMAL LOW (ref 1.7–2.4)

## 2020-08-13 LAB — BASIC METABOLIC PANEL
Anion gap: 8 (ref 5–15)
BUN: 15 mg/dL (ref 8–23)
CO2: 28 mmol/L (ref 22–32)
Calcium: 9.6 mg/dL (ref 8.9–10.3)
Chloride: 102 mmol/L (ref 98–111)
Creatinine, Ser: 0.92 mg/dL (ref 0.44–1.00)
GFR calc Af Amer: >60 mL/min (ref >60)
GFR calc non Af Amer: 57 mL/min — ABNORMAL LOW (ref >60)
Glucose, Bld: 109 mg/dL — ABNORMAL HIGH (ref 70–99)
Potassium: 3.9 mmol/L (ref 3.5–5.1)
Sodium: 138 mmol/L (ref 135–145)

## 2020-08-13 LAB — CBC
HCT: 39.2 % (ref 36.0–46.0)
Hemoglobin: 11.8 g/dL — ABNORMAL LOW (ref 12.0–15.0)
MCH: 28.2 pg (ref 26.0–34.0)
MCHC: 30.1 g/dL (ref 30.0–36.0)
MCV: 93.6 fL (ref 80.0–100.0)
Platelets: 137 10*3/uL — ABNORMAL LOW (ref 150–400)
RBC: 4.19 MIL/uL (ref 3.87–5.11)
RDW: 14.2 % (ref 11.5–15.5)
WBC: 8.7 10*3/uL (ref 4.0–10.5)
nRBC: 0 % (ref 0.0–0.2)

## 2020-08-13 LAB — PHOSPHORUS: Phosphorus: 4 mg/dL (ref 2.5–4.6)

## 2020-08-13 MED ORDER — MAGNESIUM SULFATE 2 GM/50ML IV SOLN
2.0000 g | Freq: Once | INTRAVENOUS | Status: AC
  Administered 2020-08-13: 2 g via INTRAVENOUS
  Filled 2020-08-13: qty 50

## 2020-08-13 MED ORDER — SPIRONOLACTONE 25 MG PO TABS
12.5000 mg | ORAL_TABLET | Freq: Every day | ORAL | 1 refills | Status: DC
Start: 2020-08-14 — End: 2021-08-06

## 2020-08-13 MED ORDER — HYDRALAZINE HCL 50 MG PO TABS: 50.0000 mg | ORAL_TABLET | Freq: Two times a day (BID) | ORAL | 1 refills | Status: DC

## 2020-08-13 NOTE — Progress Notes (Signed)
Reviewed discharge instructions and confirmed understanding through teachback. Patient discharged with daughter to home.

## 2020-08-13 NOTE — Evaluation (Signed)
Physical Therapy Evaluation Patient Details Name: [name] MRN: [id number] DOB: February 21, 1933 Today's Date: 08/13/2020   History of Present Illness  Patient is a 86 y/o female who presents with LUE numbness, dizziness and HTN, sent from urgent care. NIH:1. CXR- RLL infiltrates concern for PNA. MRI-unremarkable. Admitted for likely HTN urgency. PMH includes-HTN, HLD, DM,depression, Hepatitis, CHF.  Clinical Impression  Patient presents with generalized weakness and impaired mobility s/p above. Pt lives alone and reports being Mod I for ADLs PTA. Family assists with IADLs. Today, pt tolerated bed mobility, transfers and gait training with supervision-Min guard assist and use of RW for support. Reports some weakness in BLEs with LLE>RLE due to chronic back pain. Encouraged increasing activity while in the hospital. Will follow acutely to maximize independence and mobility prior to return home.    Follow Up Recommendations Home health PT;Supervision - Intermittent    Equipment Recommendations  None recommended by PT    Recommendations for Other Services       Precautions / Restrictions Precautions Precautions: Fall Restrictions Weight Bearing Restrictions: No      Mobility  Bed Mobility Overal bed mobility: Needs Assistance Bed Mobility: Rolling;Sidelying to Sit Rolling: Supervision Sidelying to sit: Supervision;HOB elevated       General bed mobility comments: Use of rail, no assist needed.  Transfers Overall transfer level: Needs assistance Equipment used: Rolling walker (2 wheeled) Transfers: Sit to/from Stand Sit to Stand: Min guard         General transfer comment: Min guard for safety. Stood from [employer], transferred to chair post ambulation.  Ambulation/Gait Ambulation/Gait assistance: Min guard Gait Distance (Feet): 40 Feet Assistive device: Rolling walker (2 wheeled) Gait Pattern/deviations: Step-through pattern;Decreased stride length Gait velocity:  decreased Gait velocity interpretation: <1.8 ft/sec, indicate of risk for recurrent falls General Gait Details: Slow, mildly unsteady gait with RW for support; initially slower gait but speed increased appropriately with distance.  Stairs            Wheelchair Mobility    Modified Rankin (Stroke Patients Only) Modified Rankin (Stroke Patients Only) Pre-Morbid Rankin Score: Moderate disability Modified Rankin: Moderately severe disability     Balance Overall balance assessment: Needs assistance Sitting-balance support: Feet supported;No upper extremity supported Sitting balance-Leahy Scale: Good     Standing balance support: During functional activity Standing balance-Leahy Scale: Poor Standing balance comment: Requires UE support in standing.                             Pertinent Vitals/Pain Pain Assessment: No/denies pain    Home Living Family/patient expects to be discharged to:: Private residence Living Arrangements: Alone Available Help at Discharge: Family;Available PRN/intermittently Type of Home: House Home Access: Stairs to enter Entrance Stairs-Rails: Psychiatric nurse of Steps: 2 Home Layout: One level Home Equipment: Cane - single point;Shower seat;Walker - 2 wheels;Walker - 4 wheels      Prior Function Level of Independence: Independent with assistive device(s)         Comments: Does own ADLs, IADLs, uses RW for ambulation. Drives short distances.     Hand Dominance   Dominant Hand: Right    Extremity/Trunk Assessment   Upper Extremity Assessment Upper Extremity Assessment: Defer to OT evaluation    Lower Extremity Assessment Lower Extremity Assessment: Generalized weakness (Reports LLE weakness at baseline due to chronic back pain)       Communication   Communication: HOH  Cognition Arousal/Alertness: Awake/alert Behavior  During Therapy: WFL for tasks assessed/performed Overall Cognitive Status: Within  Functional Limits for tasks assessed                                        General Comments      Exercises     Assessment/Plan    PT Assessment Patient needs continued PT services  PT Problem List Decreased strength;Decreased mobility;Decreased activity tolerance       PT Treatment Interventions Therapeutic activities;Gait training;Therapeutic exercise;Patient/family education;Balance training;Stair training;Functional mobility training    PT Goals (Current goals can be found in the Care Plan section)  Acute Rehab PT Goals Patient Stated Goal: to return home PT Goal Formulation: With patient Time For Goal Achievement: 08/27/20 Potential to Achieve Goals: Good    Frequency Min 3X/week   Barriers to discharge Decreased caregiver support lives alone    Co-evaluation               AM-PAC PT "6 Clicks" Mobility  Outcome Measure Help needed turning from your back to your side while in a flat bed without using bedrails?: None Help needed moving from lying on your back to sitting on the side of a flat bed without using bedrails?: A Little Help needed moving to and from a bed to a chair (including a wheelchair)?: A Little Help needed standing up from a chair using your arms (e.g., wheelchair or bedside chair)?: A Little Help needed to walk in hospital room?: A Little Help needed climbing 3-5 steps with a railing? : A Little 6 Click Score: 19    End of Session Equipment Utilized During Treatment: Gait belt Activity Tolerance: Patient tolerated treatment well Patient left: in chair;with call bell/phone within reach;with chair alarm set;Other (comment) (CM in room) Nurse Communication: Mobility status PT Visit Diagnosis: Muscle weakness (generalized) (M62.81);Difficulty in walking, not elsewhere classified (R26.2)    Time: 8469-6295 PT Time Calculation (min) (ACUTE ONLY): 15 min   Charges:   PT Evaluation $PT Eval Moderate Complexity: 1 Mod           Marisa Severin, PT, DPT Acute Rehabilitation Services Pager [phone] Office [phone]      Marguarite Arbour A Sabra Heck 08/13/2020, 10:35 AM

## 2020-08-13 NOTE — Evaluation (Signed)
Occupational Therapy Evaluation Patient Details Name: [name] MRN: [id number] DOB: 1933/08/26 Today's Date: 08/13/2020    History of Present Illness Patient is a 86 y/o female who presents with LUE numbness, dizziness and HTN, sent from urgent care. NIH:1. CXR- RLL infiltrates concern for PNA. MRI-unremarkable. Admitted for likely HTN urgency. PMH includes-HTN, HLD, DM,depression, Hepatitis, CHF.   Clinical Impression   PTA, pt was living alone and was independent with ADL and IADLs; family checking in regularly. Pt currently performing ADLs at Supervision level; assistance for LB dressing as pt typically uses AE at home. Pt performing toileting, hand hygiene, and mobility in hallway with Supervision and RW. Pt reporting that numbness at LUE has resolves. Feel pt is close to baseline function. Recommend dc to home once medically stable per physician. All acute OT needs met and will sign off.     Follow Up Recommendations  No OT follow up    Equipment Recommendations  None recommended by OT    Recommendations for Other Services PT consult     Precautions / Restrictions Precautions Precautions: Fall Restrictions Weight Bearing Restrictions: No      Mobility Bed Mobility Overal bed mobility: Needs Assistance Bed Mobility: Rolling;Sidelying to Sit Rolling: Supervision Sidelying to sit: Min assist;HOB elevated       General bed mobility comments: Min A to pull trunk into upright posture  Transfers Overall transfer level: Needs assistance Equipment used: Rolling walker (2 wheeled) Transfers: Sit to/from Stand Sit to Stand: Supervision         General transfer comment: Supervision for safety    Balance Overall balance assessment: Needs assistance Sitting-balance support: Feet supported;No upper extremity supported Sitting balance-Leahy Scale: Good     Standing balance support: During functional activity Standing balance-Leahy Scale: Fair Standing balance  comment: Supervision for safety                           ADL either performed or assessed with clinical judgement   ADL Overall ADL's : Needs assistance/impaired Eating/Feeding: Set up;Sitting   Grooming: Set up;Supervision/safety;Wash/dry hands;Standing   Upper Body Bathing: Supervision/ safety;Set up;Sitting   Lower Body Bathing: Supervison/ safety;Set up;Sit to/from stand   Upper Body Dressing : Supervision/safety;Set up;Sitting   Lower Body Dressing: Moderate assistance;Sit to/from stand Lower Body Dressing Details (indicate cue type and reason): Increased assistance for donning socks. Normally uses AE Toilet Transfer: Supervision/safety;Ambulation;RW           Functional mobility during ADLs: Supervision/safety;Rolling walker General ADL Comments: Pt performing ADLs at supervision level. Requiring assistance for LB ADLs as pt typically uses AE at home.      Vision         Perception     Praxis      Pertinent Vitals/Pain Pain Assessment: No/denies pain     Hand Dominance Right   Extremity/Trunk Assessment Upper Extremity Assessment Upper Extremity Assessment: Overall WFL for tasks assessed (Reports that LUE numbness has resolved)   Lower Extremity Assessment Lower Extremity Assessment: Defer to PT evaluation   Cervical / Trunk Assessment Cervical / Trunk Assessment: Other exceptions Cervical / Trunk Exceptions: Increased body habitus   Communication Communication Communication: HOH   Cognition Arousal/Alertness: Awake/alert Behavior During Therapy: WFL for tasks assessed/performed Overall Cognitive Status: Within Functional Limits for tasks assessed  General Comments  VSS    Exercises     Shoulder Instructions      Home Living Family/patient expects to be discharged to:: Private residence Living Arrangements: Alone Available Help at Discharge: Family;Available  PRN/intermittently Type of Home: House Home Access: Stairs to enter [employer] of Steps: 2 Entrance Stairs-Rails: Right;Left Home Layout: One level     Bathroom Shower/Tub: Teacher, early years/pre: Handicapped height     Home Equipment: Cane - single point;Shower seat;Walker - 2 wheels;Walker - 4 wheels;Adaptive equipment Adaptive Equipment: Reacher;Sock aid;Long-handled shoe horn        Prior Functioning/Environment Level of Independence: Independent with assistive device(s)        Comments: Does own ADLs, IADLs, uses RW for ambulation. Drives short distances.        OT Problem List: Decreased range of motion;Impaired balance (sitting and/or standing);Decreased knowledge of precautions      OT Treatment/Interventions:      OT Goals(Current goals can be found in the care plan section) Acute Rehab OT Goals Patient Stated Goal: to return home OT Goal Formulation: All assessment and education complete, DC therapy  OT Frequency:     Barriers to D/C:            Co-evaluation              AM-PAC OT "6 Clicks" Daily Activity     Outcome Measure Help from another person eating meals?: None Help from another person taking care of personal grooming?: A Little Help from another person toileting, which includes using toliet, bedpan, or urinal?: A Little Help from another person bathing (including washing, rinsing, drying)?: A Little Help from another person to put on and taking off regular upper body clothing?: A Little Help from another person to put on and taking off regular lower body clothing?: A Lot 6 Click Score: 18   End of Session Equipment Utilized During Treatment: Rolling walker Nurse Communication: Mobility status  Activity Tolerance: Patient tolerated treatment well Patient left: in bed;with call bell/phone within reach;with bed alarm set  OT Visit Diagnosis: Unsteadiness on feet (R26.81);Other abnormalities of gait and mobility  (R26.89);Muscle weakness (generalized) (M62.81)                Time: 1345-1411 OT Time Calculation (min): 26 min Charges:  OT General Charges $OT Visit: 1 Visit OT Evaluation $OT Eval Moderate Complexity: 1 Mod OT Treatments $Self Care/Home Management : 8-22 mins  Jhane Lorio MSOT, OTR/L Acute Rehab Pager: [phone] Office: [place] 08/13/2020, 2:22 PM

## 2020-08-13 NOTE — TOC Transition Note (Addendum)
Transition of Care [facility]) - CM/SW Discharge Note   Patient Details  Name: [name] MRN: [id number] Date of Birth: 07-28-1933  Transition of Care [facility]) CM/SW Contact:  Carles Collet, RN Phone Number: 08/13/2020, 8:24 AM   Clinical Narrative:    Damaris Schooner w patient at bedside. She states she has all needed DME at home including RW, CPAP, nebulizer. She is agreeable to [facility] services PT OT. Discussed providers and ratings, referral made to [facility], accepted. Requested [place] orders from attending.    Final next level of care: [place] Barriers to Discharge: No Barriers Identified   Patient Goals and CMS Choice Patient states their goals for this hospitalization and ongoing recovery are:: to go home CMS Medicare.gov Compare Post Acute Care list provided to:: Patient Choice offered to / list presented to : Patient  Discharge Placement                       Discharge Plan and Services                          HH Arranged: PT, OT [facility] Agency: [place] Date [place]: 08/13/20 Time [place]: [phone] Representative spoke with at [place]: [place] ([place]) Interventions     Readmission Risk Interventions No flowsheet data found.

## 2020-08-13 NOTE — Discharge Summary (Signed)
Physician Discharge Summary  [name] ZTI:[id number] DOB: 1933-01-31 DOA: 08/11/2020  PCP: [name], MD  Admit date: 08/11/2020   Discharge date: 08/13/2020  Admitted From:  Home.  Disposition:  Home with home services(PT /OT)  Recommendations for Outpatient Follow-up:  1. Follow up with PCP in 1-2 weeks. 2. Please obtain BMP/CBC in one week. 3. Patient was admitted for hypertensive urgency,  stroke has been ruled out. 4. Patient was found to have infiltrate on chest x-ray but there was no symptoms. 5. Advised PCP to repeat chest x-ray in 1 week.  Home Health:Yes Equipment/Devices: Home PT  Discharge Condition: Good CODE STATUS:Full code Diet recommendation: Heart Healthy   Brief Summary / Hospital course: This 86 y.o.femalewith medical history significant ofHTN, diastolic CHF, mild AS, OSA on CPAP sent from local urgent care for evaluation of  stroke.  Patient reports having dizziness, associated with left arm numbness and found to have BP of 220/160 in the UC.  Patient was admitted for observation to rule out a stroke. She was seen by neurology,  stroke was ruled out,  MRI was negative for acute abnormality.  Dizziness and left arm numbness has resolved after blood pressure improved..  Neurology signed off.  Most likely patient has hypertensive urgency causing the symptoms. Chest x-ray shows right lower lobe infiltrate suspicious for pneumonia but no other findings consistent with pneumonia.  She remained afebrile without leukocytosis, procalcitonin level normal.  Patient was not started on antibiotics, will request primary care physician to check chest x-ray within 1 week.  Physical therapy recommended home PT with supervision.  Patient feels better and wants to be discharged home.  Home health PT and OT has been arranged patient is being discharged home.  She was managed for below problems.  Discharge Diagnoses:  Principal Problem:   Hypertensive urgency Active  Problems:   Essential hypertension   OSA (obstructive sleep apnea)   Chronic diastolic CHF (congestive heart failure) (HCC)   Stroke-like symptoms  1. Hypertensive urgency: Pt presented with SOB, dizziness, L arm numbness, severe HTN 220/160.  MRI:  brain is neg for stroke. CT head :  negative Continue PRN hydralazine ordered. Resumed home BP meds. BP improved  Neurology consulted, stroke ruled out.  Dizziness and left arm numbness has resolved after blood pressure improves. Neurology signed off.  2. Right LL infiltrate:   Procalcitonin 0.10,  BNP 109.4 , remains afebrile, no leucocytosis, Monitor for fever,  COVIDnegative.  will hold off antibiotics for now.   Will request PCP to repeat CXR in one week.  2. HTN - Improved. 1. Resumed home meds.  3. OSA - 1. Cont home CPAP QHS.  4. dCHF - Not in exacerbation. 1. BNP 109.4   Echo 8/20 : LVEF 65- 70%  5. Abnormal bone marrow signal of C3-4 on MRI - 1. Follow up C-spine MRI / work up outpt   Discharge Instructions  Discharge Instructions    Call MD for:  difficulty breathing, headache or visual disturbances   Complete by: As directed    Call MD for:  persistant dizziness or light-headedness   Complete by: As directed    Call MD for:  persistant nausea and vomiting   Complete by: As directed    Diet - low sodium heart healthy   Complete by: As directed    Diet Carb Modified   Complete by: As directed    Discharge instructions   Complete by: As directed    Advised to follow-up  with primary care physician in 1 week. Patient was admitted for hypertensive urgency stroke has been ruled out. Patient was found to have infiltrate on chest x-ray but there was no symptoms. Advised PCP to repeat chest x-ray in 1 week.   Increase activity slowly   Complete by: As directed      Allergies as of 08/13/2020      Reactions   Amlodipine Other (See Comments)   Reaction:  Dizziness    Enalapril Cough   Losartan Potassium  Other (See Comments)   Reaction:  GI pain    Morphine And Related Itching, Nausea And Vomiting   Metformin Diarrhea      Medication List    STOP taking these medications   HYDROcodone-acetaminophen 5-325 MG tablet Commonly known as: NORCO/VICODIN   solifenacin 10 MG tablet Commonly known as: VESICARE     TAKE these medications   albuterol (2.5 MG/3ML) 0.083% nebulizer solution Commonly known as: PROVENTIL INHALE 3 MLS(2.5 MG TOTAL) BY NEBULIZATION TWICE DAILY AS NEEDED FOR WHEEZING OR SHORTNESS OF BREATH What changed: See the new instructions.   BAYER BACK & BODY PAIN EX ST PO Take 1-2 tablets by mouth every 12 (twelve) hours as needed (back pain).   carvedilol 12.5 MG tablet Commonly known as: COREG TAKE 1 TABLET(12.5 MG) BY MOUTH TWICE DAILY WITH A MEAL What changed: See the new instructions.   Cholecalciferol 50 MCG (2000 UT) Tabs Take 2 tablets (4,000 Units total) by mouth daily.   clonazePAM 1 MG tablet Commonly known as: KLONOPIN TAKE 1 TABLET(1 MG) BY MOUTH TWICE DAILY AS NEEDED FOR ANXIETY What changed: See the new instructions.   hydrALAZINE 50 MG tablet Commonly known as: APRESOLINE Take 1 tablet (50 mg total) by mouth 2 (two) times daily. What changed: when to take this   ipratropium-albuterol 0.5-2.5 (3) MG/3ML Soln Commonly known as: DUONEB Inhale 3 mLs into the lungs every 6 (six) hours as needed (wheezing, Shortness of breath).   isosorbide mononitrate 20 MG tablet Commonly known as: ISMO Take 20 mg by mouth daily.   Myrbetriq 50 MG Tb24 tablet Generic drug: mirabegron ER Take 50 mg by mouth daily.   omeprazole 40 MG capsule Commonly known as: PRILOSEC TAKE 1 CAPSULE(40 MG) BY MOUTH DAILY What changed: See the new instructions.   spironolactone 25 MG tablet Commonly known as: ALDACTONE Take 0.5 tablets (12.5 mg total) by mouth daily. Start taking on: August 14, 2020   torsemide 20 MG tablet Commonly known as: DEMADEX Take 1 tablet  (20 mg total) by mouth daily.   traZODone 150 MG tablet Commonly known as: DESYREL Take 150 mg by mouth at bedtime.       Follow-up Information    [facility] Follow up.   Specialty: Home Health Services Why: For home health services. They will call you in 1-2 days to make first home visit.  Contact information: [address] [place]  [address] [phone]        [name], MD Follow up in 1 week(s).   Specialty: Internal Medicine Contact information: [place] [address] [phone]        [name], MD .   Specialty: Cardiology Contact information: [address] [place] [address] [phone]              Allergies  Allergen Reactions  . Amlodipine Other (See Comments)    Reaction:  Dizziness   . Enalapril Cough  .  Losartan Potassium Other (See Comments)    Reaction:  GI pain   . Morphine And Related Itching and Nausea And Vomiting  . Metformin Diarrhea    Consultations:  Neurology   Procedures/Studies: MR BRAIN WO CONTRAST  Result Date: 08/11/2020 CLINICAL DATA:  86 year old female code stroke presentation. Hypertensive, dizziness, left arm numbness. EXAM: MRI HEAD WITHOUT CONTRAST TECHNIQUE: Multiplanar, multiecho pulse sequences of the brain and surrounding structures were obtained without intravenous contrast. COMPARISON:  Head CT earlier tonight.  Brain MRI 08/26/2018. FINDINGS: Brain: No restricted diffusion to suggest acute infarction. No midline shift, mass effect, evidence of mass lesion, ventriculomegaly, extra-axial collection or acute intracranial hemorrhage. Cervicomedullary junction and pituitary are within normal limits. Stable small chronic infarcts in the bilateral cerebellum, right thalamus. Patchy bilateral cerebral white matter T2 and FLAIR hyperintensity has not significantly changed since 2019. No cortical encephalomalacia or chronic cerebral blood  products identified. Vascular: Major intracranial vascular flow voids are stable since 2019. Skull and upper cervical spine: Chronic cervical spine degeneration and evidence of C3-C4 spinal stenosis on series 4, image 12. Conspicuous new decreased T1 marrow signal at the anterior vertebral bodies there (series 4, image 12) since 2019. Otherwise stable visible cervical levels, with normal prevertebral and paraspinal soft tissues. Visible bone marrow signal elsewhere appears stable and within normal limits. Sinuses/Orbits: Stable. Chronic right sphenoid sinusitis. Chronic right maxillary retention cysts. Other: Mastoids remain clear. Visible internal auditory structures appear normal. Scalp and face appear negative. IMPRESSION: 1. No acute intracranial abnormality. Stable chronic small vessel disease since 2019. 2. Unusual appearance of altered bone marrow signal limited to the ventral C3 and C4 vertebral bodies, new since 2019. This is of unclear etiology and significance. Would recommend ensuring the patient is up-to-date on appropriate cancer screening. And otherwise consider follow-up Cervical Spine CT or MRI to attempt further imaging characterization. Electronically Signed   By: Genevie Ann M.D.   On: 08/11/2020 23:26   DG CHEST PORT 1 VIEW  Result Date: 08/12/2020 CLINICAL DATA:  Pneumonia EXAM: PORTABLE CHEST 1 VIEW COMPARISON:  August 11, 2020 FINDINGS: Evaluation is limited secondary to patient rotation the cardiomediastinal silhouette is unchanged in contour. No pleural effusion. No pneumothorax. Scattered bibasilar heterogeneous opacities. Previously described RIGHT lower lung heterogeneous opacity is obscured secondary to patient rotation. Visualized abdomen is unremarkable. Multilevel degenerative changes of the thoracic spine. IMPRESSION: Scattered bibasilar heterogeneous opacities, with differential considerations including atelectasis versus infection. Previously described RIGHT lower lung  heterogeneous opacity is obscured secondary to patient rotation. Electronically Signed   By: Valentino Saxon MD   On: 08/12/2020 09:08   DG Chest Port 1 View  Result Date: 08/11/2020 CLINICAL DATA:  Shortness of breath. EXAM: PORTABLE CHEST 1 VIEW COMPARISON:  May 24, 2020 FINDINGS: Subtle opacity in the right base. The heart, hila, mediastinum, pleura, and lungs are otherwise unremarkable. IMPRESSION: Subtle infiltrate in the right base worrisome for pneumonia. Recommend short-term follow-up imaging to ensure resolution. Electronically Signed   By: Dorise Bullion III M.D   On: 08/11/2020 20:14   CT HEAD CODE STROKE WO CONTRAST  Result Date: 08/11/2020 CLINICAL DATA:  Code stroke. 86 year old female hypertensive, dizziness, left arm numbness. EXAM: CT HEAD WITHOUT CONTRAST TECHNIQUE: Contiguous axial images were obtained from the base of the skull through the vertex without intravenous contrast. COMPARISON:  Head CT 08/25/2018, brain MRI 08/26/2018. FINDINGS: Brain: Stable cerebral volume. No midline shift, mass effect, or evidence of intracranial mass lesion. No acute intracranial hemorrhage identified. No ventriculomegaly. Patchy  bilateral white matter hypodensity has not significantly changed. Chronic lacunar infarct of the right thalamus is stable. Small chronic right cerebellar infarct is stable. No cortically based acute infarct identified. Vascular: Calcified atherosclerosis at the skull base. No suspicious intracranial vascular hyperdensity. Skull: No acute osseous abnormality identified. Sinuses/Orbits: Chronic right sphenoid sinusitis is stable. Tympanic cavities and mastoids remain clear. Other: No acute orbit or scalp soft tissue findings. ASPECTS [facility] Stroke Program Early CT Score) Total score (0-10 with 10 being normal): 10 IMPRESSION: 1. No acute cortically based infarct or acute intracranial hemorrhage identified. ASPECTS 10. 2. Stable non contrast CT appearance of chronic small  vessel disease. 3. These results were communicated to Dr. Lorraine Lax at 8:06 pm on 08/11/2020 by text page via the [facility] messaging system. Electronically Signed   By: Genevie Ann M.D.   On: 08/11/2020 20:06    (Echo, CT Head, MRI Brain.   Subjective: Patient was seen and examined at bedside.  Overnight events noted.  Patient denies any chest pain, shortness of breath.  Patient has ambulated well with a walker in the hallway.  Denies any dizziness,  palpitations.  Patient reports numbness and dizziness is resolved.  She wants to be discharged home.  Discharge Exam: Vitals:   08/13/20 0403 08/13/20 0735  BP: (!) 150/63 (!) 141/50  Pulse: 77 70  Resp: 19 16  Temp: 98.9 F (37.2 C) 98.2 F (36.8 C)  SpO2: 92% 94%   Vitals:   08/12/20 2106 08/12/20 2331 08/13/20 0403 08/13/20 0735  BP: (!) 139/52 (!) 153/65 (!) 150/63 (!) 141/50  Pulse: 72 73 77 70  Resp: 18 18 19 16   Temp: 98.5 F (36.9 C) 98.1 F (36.7 C) 98.9 F (37.2 C) 98.2 F (36.8 C)  TempSrc: Oral Oral Oral Oral  SpO2: 97% 92% 92% 94%    General: Pt is alert, awake, not in acute distress Cardiovascular: RRR, S1/S2 +, no rubs, no gallops Respiratory: CTA bilaterally, no wheezing, no rhonchi Abdominal: Soft, NT, ND, bowel sounds + Extremities: no edema, no cyanosis    The results of significant diagnostics from this hospitalization (including imaging, microbiology, ancillary and laboratory) are listed below for reference.     Microbiology: Recent Results (from the past 240 hour(s))  SARS Coronavirus 2 by RT PCR (hospital order, performed in [facility] hospital lab) Nasopharyngeal Nasopharyngeal Swab     Status: None   Collection Time: 08/11/20  8:57 PM   Specimen: Nasopharyngeal Swab  Result Value Ref Range Status   SARS Coronavirus 2 NEGATIVE NEGATIVE Final    Comment: (NOTE) SARS-CoV-2 target nucleic acids are NOT DETECTED.  The SARS-CoV-2 RNA is generally detectable in upper and lower respiratory specimens during  the acute phase of infection. The lowest concentration of SARS-CoV-2 viral copies this assay can detect is 250 copies / mL. A negative result does not preclude SARS-CoV-2 infection and should not be used as the sole basis for treatment or other patient management decisions.  A negative result may occur with improper specimen collection / handling, submission of specimen other than nasopharyngeal swab, presence of viral mutation(s) within the areas targeted by this assay, and inadequate number of viral copies (<250 copies / mL). A negative result must be combined with clinical observations, patient history, and epidemiological information.  Fact Sheet for Patients:   StrictlyIdeas.no  Fact Sheet for Healthcare Providers: BankingDealers.co.za  This test is not yet approved or  cleared by the [place] FDA and has been authorized for detection and/or diagnosis  of SARS-CoV-2 by FDA under an Emergency Use Authorization (EUA).  This EUA will remain in effect (meaning this test can be used) for the duration of the COVID-19 declaration under Section 564(b)(1) of the Act, 21 U.S.C. section 360bbb-3(b)(1), unless the authorization is terminated or revoked sooner.  Performed at [facility], [place] [address], [place], [place] [address]      Labs: BNP (last 3 results) Recent Labs    08/12/20 0111  BNP 416.6*   Basic Metabolic Panel: Recent Labs  Lab 08/11/20 1957 08/11/20 2002 08/12/20 1417 08/13/20 0547  NA 137 138 141 138  K 4.2 3.9 4.4 3.9  CL 99 100 105 102  CO2 28  --  26 28  GLUCOSE 120* 118* 111* 109*  BUN 11 12 10 15   CREATININE 0.76 0.80 0.87 0.92  CALCIUM 9.6  --  9.6 9.6  MG  --   --   --  1.6*  PHOS  --   --   --  4.0   Liver Function Tests: Recent Labs  Lab 08/11/20 1957  AST 21  ALT 14  ALKPHOS 69  BILITOT 1.0  PROT 7.0  ALBUMIN 3.7   No results for input(s): LIPASE, AMYLASE in the last 168  hours. No results for input(s): AMMONIA in the last 168 hours. CBC: Recent Labs  Lab 08/11/20 1957 08/11/20 2002 08/12/20 1417 08/13/20 0547  WBC 10.6*  --  9.7 8.7  NEUTROABS 7.1  --   --   --   HGB 12.9 14.6 12.5 11.8*  HCT 42.3 43.0 41.6 39.2  MCV 93.2  --  93.9 93.6  PLT 159  --  159 137*   Cardiac Enzymes: No results for input(s): CKTOTAL, CKMB, CKMBINDEX, TROPONINI in the last 168 hours. BNP: Invalid input(s): POCBNP CBG: Recent Labs  Lab 08/12/20 1648 08/12/20 2129 08/13/20 0606  GLUCAP 159* 154* 116*   D-Dimer No results for input(s): DDIMER in the last 72 hours. Hgb A1c No results for input(s): HGBA1C in the last 72 hours. Lipid Profile No results for input(s): CHOL, HDL, LDLCALC, TRIG, CHOLHDL, LDLDIRECT in the last 72 hours. Thyroid function studies No results for input(s): TSH, T4TOTAL, T3FREE, THYROIDAB in the last 72 hours.  Invalid input(s): FREET3 Anemia work up No results for input(s): VITAMINB12, FOLATE, FERRITIN, TIBC, IRON, RETICCTPCT in the last 72 hours. Urinalysis    Component Value Date/Time   COLORURINE YELLOW 01/29/2019 1633   APPEARANCEUR CLOUDY (A) 01/29/2019 1633   LABSPEC 1.025 01/29/2019 1633   PHURINE 5.5 01/29/2019 1633   GLUCOSEU NEGATIVE 01/29/2019 1633   GLUCOSEU NEGATIVE 03/30/2018 1358   HGBUR MODERATE (A) 01/29/2019 1633   BILIRUBINUR negative 04/[address] 01/29/2019 1633   PROTEINUR Negative 04/15/2019 1634   PROTEINUR TRACE (A) 01/29/2019 1633   UROBILINOGEN 0.2 04/15/2019 1634   UROBILINOGEN 0.2 03/30/2018 1358   NITRITE negative 04/15/2019 1634   NITRITE NEGATIVE 01/29/2019 1633   LEUKOCYTESUR Small (1+) (A) 04/15/2019 1634   LEUKOCYTESUR MODERATE (A) 01/29/2019 1633   Sepsis Labs Invalid input(s): PROCALCITONIN,  WBC,  LACTICIDVEN Microbiology Recent Results (from the past 240 hour(s))  SARS Coronavirus 2 by RT PCR (hospital order, performed in [place] hospital lab) Nasopharyngeal  Nasopharyngeal Swab     Status: None   Collection Time: 08/11/20  8:57 PM   Specimen: Nasopharyngeal Swab  Result Value Ref Range Status   SARS Coronavirus 2 NEGATIVE NEGATIVE Final    Comment: (NOTE) SARS-CoV-2 target nucleic  acids are NOT DETECTED.  The SARS-CoV-2 RNA is generally detectable in upper and lower respiratory specimens during the acute phase of infection. The lowest concentration of SARS-CoV-2 viral copies this assay can detect is 250 copies / mL. A negative result does not preclude SARS-CoV-2 infection and should not be used as the sole basis for treatment or other patient management decisions.  A negative result may occur with improper specimen collection / handling, submission of specimen other than nasopharyngeal swab, presence of viral mutation(s) within the areas targeted by this assay, and inadequate number of viral copies (<250 copies / mL). A negative result must be combined with clinical observations, patient history, and epidemiological information.  Fact Sheet for Patients:   StrictlyIdeas.no  Fact Sheet for Healthcare Providers: BankingDealers.co.za  This test is not yet approved or  cleared by the [place] FDA and has been authorized for detection and/or diagnosis of SARS-CoV-2 by FDA under an Emergency Use Authorization (EUA).  This EUA will remain in effect (meaning this test can be used) for the duration of the COVID-19 declaration under Section 564(b)(1) of the Act, 21 U.S.C. section 360bbb-3(b)(1), unless the authorization is terminated or revoked sooner.  Performed at [facility], [place] [address], [place], [place] [address]      Time coordinating discharge: Over 30 minutes  SIGNED:   Shawna Clamp, MD  [facility] 08/13/2020, 11:14 AM Pager   If 7PM-7AM, please contact night-coverage [email or web address]

## 2020-08-13 NOTE — Discharge Instructions (Signed)
Hypertension, Adult High blood pressure (hypertension) is when the force of blood pumping through the arteries is too strong. The arteries are the blood vessels that carry blood from the heart throughout the body. Hypertension forces the heart to work harder to pump blood and may cause arteries to become narrow or stiff. Untreated or uncontrolled hypertension can cause a heart attack, heart failure, a stroke, kidney disease, and other problems. A blood pressure reading consists of a higher number over a lower number. Ideally, your blood pressure should be below 120/80. The first ("top") number is called the systolic pressure. It is a measure of the pressure in your arteries as your heart beats. The second ("bottom") number is called the diastolic pressure. It is a measure of the pressure in your arteries as the heart relaxes. What are the causes? The exact cause of this condition is not known. There are some conditions that result in or are related to high blood pressure. What increases the risk? Some risk factors for high blood pressure are under your control. The following factors may make you more likely to develop this condition:  Smoking.  Having type 2 diabetes mellitus, high cholesterol, or both.  Not getting enough exercise or physical activity.  Being overweight.  Having too much fat, sugar, calories, or salt (sodium) in your diet.  Drinking too much alcohol. Some risk factors for high blood pressure may be difficult or impossible to change. Some of these factors include:  Having chronic kidney disease.  Having a family history of high blood pressure.  Age. Risk increases with age.  Race. You may be at higher risk if you are African American.  Gender. Men are at higher risk than women before age 42. After age 7, women are at higher risk than men.  Having obstructive sleep apnea.  Stress. What are the signs or symptoms? High blood pressure may not cause symptoms. Very  high blood pressure (hypertensive crisis) may cause:  Headache.  Anxiety.  Shortness of breath.  Nosebleed.  Nausea and vomiting.  Vision changes.  Severe chest pain.  Seizures. How is this diagnosed? This condition is diagnosed by measuring your blood pressure while you are seated, with your arm resting on a flat surface, your legs uncrossed, and your feet flat on the floor. The cuff of the blood pressure monitor will be placed directly against the skin of your upper arm at the level of your heart. It should be measured at least twice using the same arm. Certain conditions can cause a difference in blood pressure between your right and left arms. Certain factors can cause blood pressure readings to be lower or higher than normal for a short period of time:  When your blood pressure is higher when you are in a health care provider's office than when you are at home, this is called white coat hypertension. Most people with this condition do not need medicines.  When your blood pressure is higher at home than when you are in a health care provider's office, this is called masked hypertension. Most people with this condition may need medicines to control blood pressure. If you have a high blood pressure reading during one visit or you have normal blood pressure with other risk factors, you may be asked to:  Return on a different day to have your blood pressure checked again.  Monitor your blood pressure at home for 1 week or longer. If you are diagnosed with hypertension, you may have other blood  or imaging tests to help your health care provider understand your overall risk for other conditions. How is this treated? This condition is treated by making healthy lifestyle changes, such as eating healthy foods, exercising more, and reducing your alcohol intake. Your health care provider may prescribe medicine if lifestyle changes are not enough to get your blood pressure under control, and  if:  Your systolic blood pressure is above 130.  Your diastolic blood pressure is above 80. Your personal target blood pressure may vary depending on your medical conditions, your age, and other factors. Follow these instructions at home: Eating and drinking   Eat a diet that is high in fiber and potassium, and low in sodium, added sugar, and fat. An example eating plan is called the DASH (Dietary Approaches to Stop Hypertension) diet. To eat this way: ? Eat plenty of fresh fruits and vegetables. Try to fill one half of your plate at each meal with fruits and vegetables. ? Eat whole grains, such as whole-wheat pasta, brown rice, or whole-grain bread. Fill about one fourth of your plate with whole grains. ? Eat or drink low-fat dairy products, such as skim milk or low-fat yogurt. ? Avoid fatty cuts of meat, processed or cured meats, and poultry with skin. Fill about one fourth of your plate with lean proteins, such as fish, chicken without skin, beans, eggs, or tofu. ? Avoid pre-made and processed foods. These tend to be higher in sodium, added sugar, and fat.  Reduce your daily sodium intake. Most people with hypertension should eat less than 1,500 mg of sodium a day.  Do not drink alcohol if: ? Your health care provider tells you not to drink. ? You are pregnant, may be pregnant, or are planning to become pregnant.  If you drink alcohol: ? Limit how much you use to:  0-1 drink a day for women.  0-2 drinks a day for men. ? Be aware of how much alcohol is in your drink. In the [place] one drink equals one 12 oz bottle of beer (355 mL), one 5 oz glass of wine (148 mL), or one 1 oz glass of hard liquor (44 mL). Lifestyle   Work with your health care provider to maintain a healthy body weight or to lose weight. Ask what an ideal weight is for you.  Get at least 30 minutes of exercise most days of the week. Activities may include walking, swimming, or biking.  Include exercise to  strengthen your muscles (resistance exercise), such as Pilates or lifting weights, as part of your weekly exercise routine. Try to do these types of exercises for 30 minutes at least 3 days a week.  Do not use any products that contain nicotine or tobacco, such as cigarettes, e-cigarettes, and chewing tobacco. If you need help quitting, ask your health care provider.  Monitor your blood pressure at home as told by your health care provider.  Keep all follow-up visits as told by your health care provider. This is important. Medicines  Take over-the-counter and prescription medicines only as told by your health care provider. Follow directions carefully. Blood pressure medicines must be taken as prescribed.  Do not skip doses of blood pressure medicine. Doing this puts you at risk for problems and can make the medicine less effective.  Ask your health care provider about side effects or reactions to medicines that you should watch for. Contact a health care provider if you:  Think you are having a reaction to a medicine  you are taking.  Have headaches that keep coming back (recurring).  Feel dizzy.  Have swelling in your ankles.  Have trouble with your vision. Get help right away if you:  Develop a severe headache or confusion.  Have unusual weakness or numbness.  Feel faint.  Have severe pain in your chest or abdomen.  Vomit repeatedly.  Have trouble breathing. Summary  Hypertension is when the force of blood pumping through your arteries is too strong. If this condition is not controlled, it may put you at risk for serious complications.  Your personal target blood pressure may vary depending on your medical conditions, your age, and other factors. For most people, a normal blood pressure is less than 120/80.  Hypertension is treated with lifestyle changes, medicines, or a combination of both. Lifestyle changes include losing weight, eating a healthy, low-sodium diet,  exercising more, and limiting alcohol. This information is not intended to replace advice given to you by your health care provider. Make sure you discuss any questions you have with your health care provider. Document Revised: 07/22/2018 Document Reviewed: 07/22/2018 Elsevier Patient Education  2020 [employer] Your Hypertension Hypertension is commonly called high blood pressure. This is when the force of your blood pressing against the walls of your arteries is too strong. Arteries are blood vessels that carry blood from your heart throughout your body. Hypertension forces the heart to work harder to pump blood, and may cause the arteries to become narrow or stiff. Having untreated or uncontrolled hypertension can cause heart attack, stroke, kidney disease, and other problems. What are blood pressure readings? A blood pressure reading consists of a higher number over a lower number. Ideally, your blood pressure should be below 120/80. The first ("top") number is called the systolic pressure. It is a measure of the pressure in your arteries as your heart beats. The second ("bottom") number is called the diastolic pressure. It is a measure of the pressure in your arteries as the heart relaxes. What does my blood pressure reading mean? Blood pressure is classified into four stages. Based on your blood pressure reading, your health care provider may use the following stages to determine what type of treatment you need, if any. Systolic pressure and diastolic pressure are measured in a unit called mm Hg. Normal  Systolic pressure: below 244.  Diastolic pressure: below 80. Elevated  Systolic pressure: 010-272.  Diastolic pressure: below 80. Hypertension stage 1  Systolic pressure: 536-644.  Diastolic pressure: 03-47. Hypertension stage 2  Systolic pressure: 425 or above.  Diastolic pressure: 90 or above. What health risks are associated with hypertension? Managing your  hypertension is an important responsibility. Uncontrolled hypertension can lead to:  A heart attack.  A stroke.  A weakened blood vessel (aneurysm).  Heart failure.  Kidney damage.  Eye damage.  Metabolic syndrome.  Memory and concentration problems. What changes can I make to manage my hypertension? Hypertension can be managed by making lifestyle changes and possibly by taking medicines. Your health care provider will help you make a plan to bring your blood pressure within a normal range. Eating and drinking   Eat a diet that is high in fiber and potassium, and low in salt (sodium), added sugar, and fat. An example eating plan is called the DASH (Dietary Approaches to Stop Hypertension) diet. To eat this way: ? Eat plenty of fresh fruits and vegetables. Try to fill half of your plate at each meal with fruits and vegetables. ?  Eat whole grains, such as whole wheat pasta, brown rice, or whole grain bread. Fill about one quarter of your plate with whole grains. ? Eat low-fat diary products. ? Avoid fatty cuts of meat, processed or cured meats, and poultry with skin. Fill about one quarter of your plate with lean proteins such as fish, chicken without skin, beans, eggs, and tofu. ? Avoid premade and processed foods. These tend to be higher in sodium, added sugar, and fat.  Reduce your daily sodium intake. Most people with hypertension should eat less than 1,500 mg of sodium a day.  Limit alcohol intake to no more than 1 drink a day for nonpregnant women and 2 drinks a day for men. One drink equals 12 oz of beer, 5 oz of wine, or 1 oz of hard liquor. Lifestyle  Work with your health care provider to maintain a healthy body weight, or to lose weight. Ask what an ideal weight is for you.  Get at least 30 minutes of exercise that causes your heart to beat faster (aerobic exercise) most days of the week. Activities may include walking, swimming, or biking.  Include exercise to  strengthen your muscles (resistance exercise), such as weight lifting, as part of your weekly exercise routine. Try to do these types of exercises for 30 minutes at least 3 days a week.  Do not use any products that contain nicotine or tobacco, such as cigarettes and e-cigarettes. If you need help quitting, ask your health care provider.  Control any long-term (chronic) conditions you have, such as high cholesterol or diabetes. Monitoring  Monitor your blood pressure at home as told by your health care provider. Your personal target blood pressure may vary depending on your medical conditions, your age, and other factors.  Have your blood pressure checked regularly, as often as told by your health care provider. Working with your health care provider  Review all the medicines you take with your health care provider because there may be side effects or interactions.  Talk with your health care provider about your diet, exercise habits, and other lifestyle factors that may be contributing to hypertension.  Visit your health care provider regularly. Your health care provider can help you create and adjust your plan for managing hypertension. Will I need medicine to control my blood pressure? Your health care provider may prescribe medicine if lifestyle changes are not enough to get your blood pressure under control, and if:  Your systolic blood pressure is 130 or higher.  Your diastolic blood pressure is 80 or higher. Take medicines only as told by your health care provider. Follow the directions carefully. Blood pressure medicines must be taken as prescribed. The medicine does not work as well when you skip doses. Skipping doses also puts you at risk for problems. Contact a health care provider if:  You think you are having a reaction to medicines you have taken.  You have repeated (recurrent) headaches.  You feel dizzy.  You have swelling in your ankles.  You have trouble with your  vision. Get help right away if:  You develop a severe headache or confusion.  You have unusual weakness or numbness, or you feel faint.  You have severe pain in your chest or abdomen.  You vomit repeatedly.  You have trouble breathing. Summary  Hypertension is when the force of blood pumping through your arteries is too strong. If this condition is not controlled, it may put you at risk for serious complications.  Your  personal target blood pressure may vary depending on your medical conditions, your age, and other factors. For most people, a normal blood pressure is less than 120/80.  Hypertension is managed by lifestyle changes, medicines, or both. Lifestyle changes include weight loss, eating a healthy, low-sodium diet, exercising more, and limiting alcohol. This information is not intended to replace advice given to you by your health care provider. Make sure you discuss any questions you have with your health care provider. Document Revised: 03/05/2019 Document Reviewed: 10/09/2016 Elsevier Patient Education  2020 [place] to follow-up with primary care physician in 1 week. Patient was admitted for hypertensive urgency stroke has been ruled out. Patient was found to have infiltrate on chest x-ray but there was no symptoms. Advised PCP to repeat chest x-ray in 1 week.

## 2020-08-13 NOTE — Care Management Obs Status (Signed)
[facility] NOTIFICATION   Patient Details  Name: [name] MRN: [id number] Date of Birth: 12/05/1932   Medicare Observation Status Notification Given:  Yes    Carles Collet, RN 08/13/2020, 8:13 AM

## 2020-08-15 LAB — GLUCOSE, CAPILLARY: Glucose-Capillary: 116 mg/dL — ABNORMAL HIGH (ref 70–99)

## 2020-08-21 ENCOUNTER — Other Ambulatory Visit: Payer: Self-pay | Admitting: Internal Medicine

## 2020-08-21 DIAGNOSIS — I5032 Chronic diastolic (congestive) heart failure: Secondary | ICD-10-CM

## 2020-08-21 DIAGNOSIS — I10 Essential (primary) hypertension: Secondary | ICD-10-CM

## 2020-08-24 ENCOUNTER — Encounter: Payer: Self-pay | Admitting: Family Medicine

## 2020-08-24 ENCOUNTER — Ambulatory Visit (INDEPENDENT_AMBULATORY_CARE_PROVIDER_SITE_OTHER): Payer: Medicare HMO | Admitting: Family Medicine

## 2020-08-24 ENCOUNTER — Ambulatory Visit: Payer: Self-pay

## 2020-08-24 ENCOUNTER — Ambulatory Visit: Payer: Medicare HMO | Admitting: Family Medicine

## 2020-08-24 ENCOUNTER — Other Ambulatory Visit: Payer: Self-pay

## 2020-08-24 ENCOUNTER — Ambulatory Visit (INDEPENDENT_AMBULATORY_CARE_PROVIDER_SITE_OTHER): Payer: Medicare HMO

## 2020-08-24 VITALS — BP 122/70 | Ht 62.0 in | Wt 222.0 lb

## 2020-08-24 DIAGNOSIS — M25551 Pain in right hip: Secondary | ICD-10-CM

## 2020-08-24 DIAGNOSIS — M1611 Unilateral primary osteoarthritis, right hip: Secondary | ICD-10-CM | POA: Diagnosis not present

## 2020-08-24 MED ORDER — HYDROCODONE-ACETAMINOPHEN 5-325 MG PO TABS: 1.0000 | ORAL_TABLET | Freq: Four times a day (QID) | ORAL | 0 refills | Status: DC | PRN

## 2020-08-24 NOTE — Patient Instructions (Signed)
Thank you for coming in today.  Please get an Xray today before you leave  Call or go to the ER if you develop a large red swollen joint with extreme pain or oozing puss.   Recheck next week.   This shot may take a day or two to really work.

## 2020-08-24 NOTE — Progress Notes (Signed)
I, Wendy Poet, LAT, ATC, am serving as scribe for Dr. Lynne Leader.  [name] is a 86 y.o. female who presents to [place] at [facility] today for f/u of R hip pain.  She was last seen by Dr. Georgina Snell on 02/29/20 w/ recurrence of R lateral hip pain / GT bursitis and had a L GT injection.  She was also prescribed hydrocodone-acetaminophen to use sparingly for pain.  She previously had home health PT for this issue and was provided w/ a HEP.  Since her last visit, pt reports worsening R hip pain since Monday.  She denies any new injury or any falls.  Diagnostic imaging: R hip XR- 10/18/19   Pertinent review of systems: No fevers or chills  Relevant historical information: Heart failure hypertension urgency and dementia   Exam:  BP 122/70 (BP Location: Left Arm, Patient Position: Sitting, Cuff Size: Large)   Ht 5\' 2"  (1.575 m)   Wt 222 lb (100.7 kg)   BMI 40.60 kg/m  General: Well Developed, well nourished, in pain appearing.   MSK: Right hip normal-appearing Tender palpation greater trochanter. Able to move hip reasonably well with mild pain. Unable to do strength testing due to pain.  Significant pain with standing.    Lab and Radiology Results  X-ray images right hip obtained today personally and independently interpreted.  X-rays obtained at the end of the visit. Significant femoral acetabular DJD.  Enthesopathic changes at greater trochanter right similar to left.  Abnormality at inferior portion of femoral head and neck junction.  Not definitive fracture per my interpretation however radiology review is still pending.  Similar appearance of femoral head and neck from previous x-ray dated November 2020 Await formal radiology review  Hip greater trochanteric injection: right Consent obtained and timeout performed. Area of maximum tenderness palpated and identified. Skin cleaned with alcohol, cold spray applied. A 22g spinal needle was used to access the  greater trochanteric bursa. 40 mg of kenalog and 2 mL of Marcaine were used to inject the trochanteric bursa. Patient tolerated the procedure well.     Assessment and Plan: 86 y.o. female with right lateral hip pain ongoing for a few days now without injury or incident.  Concerning for recurrent trochanteric bursitis.  Patient is having quite a bit of pain today and is difficult to perform a good quality physical exam.  She does not have significant pain with logroll and experiences majority of her pain with standing which is typical for bursitis or tendinitis.  Plan for injection today limited hydrocodone prescription and check back early next week.  Likely will proceed again with home health physical therapy. Patient rates pain as severe.  Chronic problem with acute exacerbation.  Orders Placed This Encounter  Procedures  . DG HIP UNILAT WITH PELVIS 2-3 VIEWS RIGHT    Standing Status:   Future    Standing Expiration Date:   08/24/2021    Order Specific Question:   Reason for Exam (SYMPTOM  OR DIAGNOSIS REQUIRED)    Answer:   eval ri hip pain    Order Specific Question:   Preferred imaging location?    Answer:   [facility]   Meds ordered this encounter  Medications  . HYDROcodone-acetaminophen (NORCO/VICODIN) 5-325 MG tablet    Sig: Take 1 tablet by mouth every 6 (six) hours as needed.    Dispense:  15 tablet    Refill:  0     Discussed warning signs or  symptoms. Please see discharge instructions. Patient expresses understanding.   The above documentation has been reviewed and is accurate and complete Lynne Leader, M.D.

## 2020-08-28 NOTE — Progress Notes (Signed)
X-ray right hip shows medium arthritis of both hip joints.  No acute fracture.

## 2020-08-29 ENCOUNTER — Ambulatory Visit: Payer: Medicare HMO | Admitting: Internal Medicine

## 2020-08-30 ENCOUNTER — Other Ambulatory Visit: Payer: Self-pay

## 2020-08-30 ENCOUNTER — Encounter: Payer: Self-pay | Admitting: Family Medicine

## 2020-08-30 ENCOUNTER — Ambulatory Visit (INDEPENDENT_AMBULATORY_CARE_PROVIDER_SITE_OTHER): Payer: Medicare HMO | Admitting: Family Medicine

## 2020-08-30 VITALS — BP 130/70 | HR 60 | Ht 62.0 in | Wt 218.0 lb

## 2020-08-30 DIAGNOSIS — M7061 Trochanteric bursitis, right hip: Secondary | ICD-10-CM

## 2020-08-30 DIAGNOSIS — M7062 Trochanteric bursitis, left hip: Secondary | ICD-10-CM

## 2020-08-30 DIAGNOSIS — M25551 Pain in right hip: Secondary | ICD-10-CM | POA: Diagnosis not present

## 2020-08-30 NOTE — Progress Notes (Signed)
   [name], am serving as a Education administrator for [name].  [name] is a 86 y.o. female who presents to [place] at [facility] today for f/u of R hip pain.  She was last seen by [name] on 08/24/20 w/ dramatically worsened hip pain and had a R GT injection.  She was also prescribed hydrocodone-acetaminophen.  Since her last visit, pt reports that the injection took 3 days to kick in, but the injection is still helping with the pain relief and is about 50% better. Patient still has some twinges when she is walking, but the pain does go away. States she is out of her hydrocodone.    Diagnostic testing: R hip XR- 08/24/20  Pertinent review of systems: No fevers or chills  Relevant historical information: Hypertension CHF dementia   Exam:  BP 130/70 (BP Location: Left Arm, Patient Position: Sitting, Cuff Size: Normal)   Pulse 60   Ht 5\' 2"  (1.575 m)   Wt 218 lb (98.9 kg)   SpO2 99%   BMI 39.87 kg/m  General: Well Developed, well nourished, and in no acute distress.   MSK: Right hip normal-appearing mildly tender palpation greater trochanter gait with walker improved.    Lab and Radiology Results EXAM: DG HIP (WITH OR WITHOUT PELVIS) 2-3V RIGHT  COMPARISON:  October 18, 2019.  FINDINGS: Frontal pelvis as well as frontal and lateral right hip images were obtained. There is no fracture or dislocation. There is moderate narrowing of each hip joint. No erosive change. Sacroiliac joints appear normal bilaterally.  IMPRESSION: Moderate narrowing of each hip joint, stable. No appreciable fracture or dislocation.   Electronically Signed   By: [name] M.D.   On: 08/25/2020 14:45  I, Lynne [name], personally (independently) visualized and performed the interpretation of the images attached in this note.      Assessment and Plan: 86 y.o. female with right hip pain due to trochanteric bursitis.  Improved since last week  following injection.  No longer taking hydrocodone.  Plan for physical therapy and advancing activity as tolerated.  Patient did have DJD on x-ray but does not have significant groin pain do not think her current pain is due to DJD.  Recheck as needed. Patient thinks that she should be able to do outpatient PT.  If having trouble getting to appointments we will switch to home health PT.  PDMP not reviewed this encounter. Orders Placed This Encounter  Procedures  . Ambulatory referral to Physical Therapy    Referral Priority:   Routine    Referral Type:   Physical Medicine    Referral Reason:   Specialty Services Required    Requested Specialty:   Physical Therapy   No orders of the defined types were placed in this encounter.    Discussed warning signs or symptoms. Please see discharge instructions. Patient expresses understanding.   The above documentation has been reviewed and is accurate and complete Lynne [name], M.D.  Total encounter time 20 minutes including face-to-face time with the patient and, reviewing past medical record, and charting on the date of service.   Discussed treatment plan and option.

## 2020-08-30 NOTE — Patient Instructions (Signed)
Thank you for coming in today.  I've referred you to Physical Therapy.  Let us know if you don't hear from them in one week.  If you have a hard time getting to PT I can organize home health physical therapy.   Please let me know if you have difficulty scheduling PT.   Otherwise recheck as needed.

## 2020-09-01 ENCOUNTER — Other Ambulatory Visit: Payer: Self-pay | Admitting: Internal Medicine

## 2020-09-04 ENCOUNTER — Other Ambulatory Visit: Payer: Self-pay

## 2020-09-04 DIAGNOSIS — G4733 Obstructive sleep apnea (adult) (pediatric): Secondary | ICD-10-CM | POA: Diagnosis not present

## 2020-09-04 NOTE — Patient Outreach (Signed)
[facility]) Care Management  09/04/2020  [name] 11-Nov-1933 [id number]   Telephone Assessment   Outreach attempt # 1 to patient. Spoke with patient who reports she is doing fairly well. She is still resting after just waking up. She shares with RN CM that she has been having some hip problems. She saw MD recently and MD wants patient to start going to outpatient therapy. She voices that she will begin in a few weeks. She also goes for PCP follow up in a few weeks as well. Patient states she is getting along and able to manage to continue to care for herself in the home ad family assists as needed. She denies any recent falls. She reports no pain at present. Appetite remains WNL for patient. She does not like to eat a big breakfast. Wgt stable. She is monitoring wgt in the home. She denies any edema/swelling. Patient states that she is sleeping/resting somewhat better which is helping her feel better overall. She states that she got tetanus an flu vaccine recently at MD office. She is eligible for COVID-19 third dose but states she is hesitant and not sure about getting it yet. She will think about it. RN CM reviewed and discussed benefits of vaccine. She denies any RN CM needs or concerns at this time.     Plan:  RN CM discussed with patient next outreach within the month of Dec . Patient gave verbal consent and in agreement with RN CM follow up and timeframe. Patient aware that they may contact RN CM sooner for any issues or concerns.  [name], RN,BSN,CCM [facility] Telephonic Care Management Coordinator Direct Phone: [phone] Toll Free: [phone] Fax: [phone]

## 2020-09-05 ENCOUNTER — Other Ambulatory Visit: Payer: Self-pay | Admitting: Internal Medicine

## 2020-09-05 DIAGNOSIS — J4520 Mild intermittent asthma, uncomplicated: Secondary | ICD-10-CM

## 2020-09-07 ENCOUNTER — Ambulatory Visit: Payer: Self-pay

## 2020-09-19 ENCOUNTER — Ambulatory Visit (INDEPENDENT_AMBULATORY_CARE_PROVIDER_SITE_OTHER): Payer: Medicare HMO | Admitting: Internal Medicine

## 2020-09-19 ENCOUNTER — Other Ambulatory Visit: Payer: Self-pay

## 2020-09-19 ENCOUNTER — Encounter: Payer: Self-pay | Admitting: Internal Medicine

## 2020-09-19 VITALS — BP 142/88 | HR 66 | Temp 98.4°F | Ht 62.0 in | Wt 218.0 lb

## 2020-09-19 DIAGNOSIS — Z515 Encounter for palliative care: Secondary | ICD-10-CM | POA: Diagnosis not present

## 2020-09-19 DIAGNOSIS — J189 Pneumonia, unspecified organism: Secondary | ICD-10-CM | POA: Diagnosis not present

## 2020-09-19 DIAGNOSIS — M159 Polyosteoarthritis, unspecified: Secondary | ICD-10-CM

## 2020-09-19 DIAGNOSIS — M8949 Other hypertrophic osteoarthropathy, multiple sites: Secondary | ICD-10-CM | POA: Diagnosis not present

## 2020-09-19 DIAGNOSIS — D696 Thrombocytopenia, unspecified: Secondary | ICD-10-CM | POA: Diagnosis not present

## 2020-09-19 MED ORDER — HYDROCODONE-ACETAMINOPHEN 5-325 MG PO TABS: 1.0000 | ORAL_TABLET | Freq: Four times a day (QID) | ORAL | 0 refills | Status: DC | PRN

## 2020-09-19 NOTE — Progress Notes (Signed)
Subjective:  Patient ID: [name], female    DOB: Jan 30, 1933  Age: 86 y.o. MRN: [id number]  CC: Anemia  This visit occurred during the SARS-CoV-2 public health emergency.  Safety protocols were in place, including screening questions prior to the visit, additional usage of staff PPE, and extensive cleaning of exam room while observing appropriate contact time as indicated for disinfecting solutions.    HPI Ellayna STACEYANN KNOUFF presents for f/up - She was recently admitted for hypertensive crisis and possible right lower lobe pneumonia.  She tells me she has felt well since discharge and her blood pressure has been well controlled.  She denies cough, fever, chills, shortness of breath, or night sweats.  She continues to complain of chronic musculoskeletal pain and requests a refill on hydrocodone and acetaminophen.  Outpatient Medications Prior to Visit  Medication Sig Dispense Refill  . albuterol (PROVENTIL) (2.5 MG/3ML) 0.083% nebulizer solution INHALE 3 MLS(2.5 MG TOTAL) BY NEBULIZATION TWICE DAILY AS NEEDED FOR WHEEZING OR SHORTNESS OF BREATH 75 mL 5  . carvedilol (COREG) 12.5 MG tablet TAKE 1 TABLET(12.5 MG) BY MOUTH TWICE DAILY WITH A MEAL (Patient taking differently: Take 12.5 mg by mouth 2 (two) times daily with a meal. ) 180 tablet 2  . Cholecalciferol 2000 units TABS Take 2 tablets (4,000 Units total) by mouth daily. 180 tablet 1  . clonazePAM (KLONOPIN) 1 MG tablet TAKE 1 TABLET(1 MG) BY MOUTH TWICE DAILY AS NEEDED FOR ANXIETY (Patient taking differently: Take 1 mg by mouth 2 (two) times daily as needed for anxiety. ) 60 tablet 5  . hydrALAZINE (APRESOLINE) 50 MG tablet Take 1 tablet (50 mg total) by mouth 2 (two) times daily. 60 tablet 1  . ipratropium-albuterol (DUONEB) 0.5-2.5 (3) MG/3ML SOLN Inhale 3 mLs into the lungs every 6 (six) hours as needed (wheezing, Shortness of breath).     . isosorbide mononitrate (ISMO) 20 MG tablet Take 20 mg by mouth daily.     Marland Kitchen MYRBETRIQ 50 MG TB24  tablet TAKE 1 TABLET(50 MG) BY MOUTH DAILY 90 tablet 1  . omeprazole (PRILOSEC) 40 MG capsule TAKE 1 CAPSULE(40 MG) BY MOUTH DAILY (Patient taking differently: Take 40 mg by mouth daily. ) 90 capsule 1  . spironolactone (ALDACTONE) 25 MG tablet Take 0.5 tablets (12.5 mg total) by mouth daily. 30 tablet 1  . torsemide (DEMADEX) 20 MG tablet TAKE 1 TABLET(20 MG) BY MOUTH DAILY 90 tablet 0  . traZODone (DESYREL) 150 MG tablet Take 150 mg by mouth at bedtime.     . Aspirin-Caffeine (BAYER BACK & BODY PAIN EX ST PO) Take 1-2 tablets by mouth every 12 (twelve) hours as needed (back pain).    Marland Kitchen HYDROcodone-acetaminophen (NORCO/VICODIN) 5-325 MG tablet Take 1 tablet by mouth every 6 (six) hours as needed. 15 tablet 0   No facility-administered medications prior to visit.    ROS Review of Systems  Constitutional: Negative.  Negative for appetite change, chills, diaphoresis, fatigue and fever.  HENT: Negative.   Eyes: Negative for visual disturbance.  Respiratory: Negative for cough, chest tightness, shortness of breath and wheezing.   Cardiovascular: Negative for chest pain, palpitations and leg swelling.  Gastrointestinal: Negative for abdominal pain, constipation, diarrhea, nausea and vomiting.  Endocrine: Negative.   Genitourinary: Negative.  Negative for difficulty urinating and dysuria.  Musculoskeletal: Positive for arthralgias.  Skin: Negative.  Negative for color change, pallor and rash.  Neurological: Negative.  Negative for dizziness, weakness and light-headedness.  Hematological: Negative for adenopathy.  Does not bruise/bleed easily.  Psychiatric/Behavioral: Negative for decreased concentration, dysphoric mood and sleep disturbance. The patient is nervous/anxious.     Objective:  BP (!) 142/88   Pulse 66   Temp 98.4 F (36.9 C) (Oral)   Ht 5\' 2"  (1.575 m)   Wt 218 lb (98.9 kg)   SpO2 94%   BMI 39.87 kg/m   BP Readings from Last 3 Encounters:  09/19/20 (!) 142/88  08/30/20  130/70  08/24/20 122/70    Wt Readings from Last 3 Encounters:  09/19/20 218 lb (98.9 kg)  08/30/20 218 lb (98.9 kg)  08/24/20 222 lb (100.7 kg)    Physical Exam Vitals reviewed.  HENT:     Nose: Nose normal.     Mouth/Throat:     Mouth: Mucous membranes are moist.  Eyes:     General: No scleral icterus.    Conjunctiva/sclera: Conjunctivae normal.  Cardiovascular:     Rate and Rhythm: Normal rate and regular rhythm.     Heart sounds: No murmur heard.   Pulmonary:     Effort: Pulmonary effort is normal.     Breath sounds: No stridor. No wheezing, rhonchi or rales.  Abdominal:     General: Abdomen is protuberant. Bowel sounds are normal. There is no distension.     Palpations: Abdomen is soft. There is no hepatomegaly, splenomegaly or mass.     Tenderness: There is no abdominal tenderness.  Musculoskeletal:        General: Normal range of motion.     Cervical back: Neck supple.     Right lower leg: No edema.     Left lower leg: No edema.  Lymphadenopathy:     Cervical: No cervical adenopathy.  Skin:    General: Skin is warm and dry.     Coloration: Skin is not pale.  Neurological:     General: No focal deficit present.     Mental Status: She is alert.  Psychiatric:        Mood and Affect: Mood normal.        Behavior: Behavior normal.     Lab Results  Component Value Date   WBC 8.7 08/13/2020   HGB 11.8 (L) 08/13/2020   HCT 39.2 08/13/2020   PLT 137 (L) 08/13/2020   GLUCOSE 109 (H) 08/13/2020   CHOL 186 04/10/2020   TRIG 110.0 04/10/2020   HDL 57.90 04/10/2020   LDLDIRECT 121.0 06/12/2017   LDLCALC 106 (H) 04/10/2020   ALT 14 08/11/2020   AST 21 08/11/2020   NA 138 08/13/2020   K 3.9 08/13/2020   CL 102 08/13/2020   CREATININE 0.92 08/13/2020   BUN 15 08/13/2020   CO2 28 08/13/2020   TSH 3.85 04/10/2020   INR 1.0 08/11/2020   HGBA1C 5.0 08/26/2018    MR BRAIN WO CONTRAST  Result Date: 08/11/2020 CLINICAL DATA:  86 year old female code stroke  presentation. Hypertensive, dizziness, left arm numbness. EXAM: MRI HEAD WITHOUT CONTRAST TECHNIQUE: Multiplanar, multiecho pulse sequences of the brain and surrounding structures were obtained without intravenous contrast. COMPARISON:  Head CT earlier tonight.  Brain MRI 08/26/2018. FINDINGS: Brain: No restricted diffusion to suggest acute infarction. No midline shift, mass effect, evidence of mass lesion, ventriculomegaly, extra-axial collection or acute intracranial hemorrhage. Cervicomedullary junction and pituitary are within normal limits. Stable small chronic infarcts in the bilateral cerebellum, right thalamus. Patchy bilateral cerebral white matter T2 and FLAIR hyperintensity has not significantly changed since 2019. No cortical encephalomalacia or chronic cerebral blood  products identified. Vascular: Major intracranial vascular flow voids are stable since 2019. Skull and upper cervical spine: Chronic cervical spine degeneration and evidence of C3-C4 spinal stenosis on series 4, image 12. Conspicuous new decreased T1 marrow signal at the anterior vertebral bodies there (series 4, image 12) since 2019. Otherwise stable visible cervical levels, with normal prevertebral and paraspinal soft tissues. Visible bone marrow signal elsewhere appears stable and within normal limits. Sinuses/Orbits: Stable. Chronic right sphenoid sinusitis. Chronic right maxillary retention cysts. Other: Mastoids remain clear. Visible internal auditory structures appear normal. Scalp and face appear negative. IMPRESSION: 1. No acute intracranial abnormality. Stable chronic small vessel disease since 2019. 2. Unusual appearance of altered bone marrow signal limited to the ventral C3 and C4 vertebral bodies, new since 2019. This is of unclear etiology and significance. Would recommend ensuring the patient is up-to-date on appropriate cancer screening. And otherwise consider follow-up Cervical Spine CT or MRI to attempt further imaging  characterization. Electronically Signed   By: Genevie Ann M.D.   On: 08/11/2020 23:26   DG CHEST PORT 1 VIEW  Result Date: 08/12/2020 CLINICAL DATA:  Pneumonia EXAM: PORTABLE CHEST 1 VIEW COMPARISON:  August 11, 2020 FINDINGS: Evaluation is limited secondary to patient rotation the cardiomediastinal silhouette is unchanged in contour. No pleural effusion. No pneumothorax. Scattered bibasilar heterogeneous opacities. Previously described RIGHT lower lung heterogeneous opacity is obscured secondary to patient rotation. Visualized abdomen is unremarkable. Multilevel degenerative changes of the thoracic spine. IMPRESSION: Scattered bibasilar heterogeneous opacities, with differential considerations including atelectasis versus infection. Previously described RIGHT lower lung heterogeneous opacity is obscured secondary to patient rotation. Electronically Signed   By: Valentino Saxon MD   On: 08/12/2020 09:08   DG Chest Port 1 View  Result Date: 08/11/2020 CLINICAL DATA:  Shortness of breath. EXAM: PORTABLE CHEST 1 VIEW COMPARISON:  May 24, 2020 FINDINGS: Subtle opacity in the right base. The heart, hila, mediastinum, pleura, and lungs are otherwise unremarkable. IMPRESSION: Subtle infiltrate in the right base worrisome for pneumonia. Recommend short-term follow-up imaging to ensure resolution. Electronically Signed   By: Dorise Bullion III M.D   On: 08/11/2020 20:14   CT HEAD CODE STROKE WO CONTRAST  Result Date: 08/11/2020 CLINICAL DATA:  Code stroke. 86 year old female hypertensive, dizziness, left arm numbness. EXAM: CT HEAD WITHOUT CONTRAST TECHNIQUE: Contiguous axial images were obtained from the base of the skull through the vertex without intravenous contrast. COMPARISON:  Head CT 08/25/2018, brain MRI 08/26/2018. FINDINGS: Brain: Stable cerebral volume. No midline shift, mass effect, or evidence of intracranial mass lesion. No acute intracranial hemorrhage identified. No ventriculomegaly. Patchy  bilateral white matter hypodensity has not significantly changed. Chronic lacunar infarct of the right thalamus is stable. Small chronic right cerebellar infarct is stable. No cortically based acute infarct identified. Vascular: Calcified atherosclerosis at the skull base. No suspicious intracranial vascular hyperdensity. Skull: No acute osseous abnormality identified. Sinuses/Orbits: Chronic right sphenoid sinusitis is stable. Tympanic cavities and mastoids remain clear. Other: No acute orbit or scalp soft tissue findings. ASPECTS [facility] Stroke Program Early CT Score) Total score (0-10 with 10 being normal): 10 IMPRESSION: 1. No acute cortically based infarct or acute intracranial hemorrhage identified. ASPECTS 10. 2. Stable non contrast CT appearance of chronic small vessel disease. 3. These results were communicated to Dr. Lorraine Lax at 8:06 pm on 08/11/2020 by text page via the [facility] messaging system. Electronically Signed   By: Genevie Ann M.D.   On: 08/11/2020 20:06    Assessment & Plan:  Kanitra was seen today for anemia.  Diagnoses and all orders for this visit:  Thrombocytopenia ([place])- I will monitor her platelet count, H&H, and white cell count. -     CBC with Differential/Platelet; Future  Primary osteoarthritis involving multiple joints -     HYDROcodone-acetaminophen (NORCO/VICODIN) 5-325 MG tablet; Take 1 tablet by mouth every 6 (six) hours as needed.  Encounter for palliative care involving management of pain -     HYDROcodone-acetaminophen (NORCO/VICODIN) 5-325 MG tablet; Take 1 tablet by mouth every 6 (six) hours as needed.  Pneumonia of right lower lobe due to infectious organism- Based on her symptoms and exam this has resolved.   I have discontinued Aune H. Scronce's Aspirin-Caffeine (BAYER BACK & BODY PAIN EX ST PO). I am also having her maintain her Cholecalciferol, ipratropium-albuterol, isosorbide mononitrate, clonazePAM, omeprazole, carvedilol, traZODone, hydrALAZINE, spironolactone,  torsemide, Myrbetriq, albuterol, and HYDROcodone-acetaminophen.  Meds ordered this encounter  Medications  . HYDROcodone-acetaminophen (NORCO/VICODIN) 5-325 MG tablet    Sig: Take 1 tablet by mouth every 6 (six) hours as needed.    Dispense:  90 tablet    Refill:  0     Follow-up: Return in about 6 months (around 03/20/2021).  Scarlette Calico, MD

## 2020-09-19 NOTE — Patient Instructions (Signed)

## 2020-09-20 DIAGNOSIS — J189 Pneumonia, unspecified organism: Secondary | ICD-10-CM | POA: Insufficient documentation

## 2020-09-20 DIAGNOSIS — Z515 Encounter for palliative care: Secondary | ICD-10-CM | POA: Insufficient documentation

## 2020-09-21 ENCOUNTER — Other Ambulatory Visit: Payer: Self-pay

## 2020-09-21 ENCOUNTER — Ambulatory Visit: Payer: Medicare HMO | Admitting: Physical Therapy

## 2020-09-21 ENCOUNTER — Encounter: Payer: Self-pay | Admitting: Physical Therapy

## 2020-09-21 DIAGNOSIS — M25552 Pain in left hip: Secondary | ICD-10-CM

## 2020-09-21 DIAGNOSIS — G4733 Obstructive sleep apnea (adult) (pediatric): Secondary | ICD-10-CM | POA: Diagnosis not present

## 2020-09-21 DIAGNOSIS — M25551 Pain in right hip: Secondary | ICD-10-CM | POA: Diagnosis not present

## 2020-09-21 DIAGNOSIS — M6281 Muscle weakness (generalized): Secondary | ICD-10-CM | POA: Diagnosis not present

## 2020-09-21 NOTE — Therapy (Addendum)
[facility] Physical Therapy [address] [place], [place], [address] Phone: [phone]   Fax:  [phone]  Physical Therapy Evaluation  Patient Details  Name: [name] MRN: [id number] Date of Birth: 1933/09/17 Referring Provider (PT): Gregor Hams, MD   Encounter Date: 09/21/2020   PT End of Session - 09/21/20 1507    Visit Number 1    Number of Visits 12    Date for PT Re-Evaluation 11/03/20    PT Start Time 1430    PT Stop Time 1505    PT Time Calculation (min) 35 min    Activity Tolerance Patient tolerated treatment well    Behavior During Therapy [facility] for tasks assessed/performed          Referring diagnosis? M70.61, m70.62 Treatment diagnosis? (if different than referring diagnosis) m25.552, M01,027, m62/81 What was this (referring dx) caused by? []  Surgery []  Fall []  Ongoing issue [x]  Arthritis []  Other: ____________  Laterality: []  Rt []  Lt [x]  Both  Check all possible CPT codes:      []  97110 (Therapeutic Exercise)  []  92507 (SLP Treatment)  []  97112 (Neuro Re-ed)   []  92526 (Swallowing Treatment)   []  97116 (Gait Training)   []  [id number] (Cognitive Training, 1st 15 minutes) []  97140 (Manual Therapy)   []  97130 (Cognitive Training, each add'l 15 minutes)  []  97530 (Therapeutic Activities)  []  Other, List CPT Code ____________    []  25366 (Self Care)       [x]  All codes above (97110 - 97535)  []  97012 (Mechanical Traction)  [x]  97014 (E-stim Unattended)  []  97032 (E-stim manual)  []  97033 (Ionto)  []  97035 (Ultrasound)  []  97760 (Orthotic Fit) []  97750 (Physical Performance Training) []  [id number] (Aquatic Therapy) []  97034 (Contrast Bath) []  [id number] (Paraffin) []  97597 (Wound Care 1st 20 sq cm) []  97598 (Wound Care each add'l 20 sq cm)   Past Medical History:  Diagnosis Date  . Abdominal pain   . Anemia    NOS  . Anxiety   . Back pain   . Bruises easily   . Chronic diastolic CHF (congestive heart failure) ([place])    a.  06/2013 EF 65-70%.  . Chronic headaches    HISTORY OF   . Depression   . Diabetes mellitus    type II  DIET CONTROLLED  . Diarrhea   . Hepatitis    AGE 30S  . Hyperlipidemia   . Hypertension   . Mild aortic stenosis    a. 06/2013 Echo: EF 65-70%, mod LVH with focal basal hypertrophy, very mild AS.  Marland Kitchen Osteoarthritis   . Oxygen desaturation during sleep    USES 2 LITERS BEDTIME   VIA CPAP 06/2012 [facility]   . Shortness of breath    WITH EXERTION USES 2 L O2 BEDTIME  . Sleep apnea    CPAP WITH O2 2 LITERS 2013 ([facility])  . Wears dentures   . Wears glasses     Past Surgical History:  Procedure Laterality Date  . ABDOMINAL HYSTERECTOMY    . APPENDECTOMY    . CHOLECYSTECTOMY    . JOINT REPLACEMENT     LEFT KNEE   . KNEE ARTHROPLASTY Right 03/22/2015   Procedure: COMPUTER ASSISTED TOTAL KNEE ARTHROPLASTY;  Surgeon: Marybelle Killings, MD;  Location: [place];  Service: Orthopedics;  Laterality: Right;  . KNEE ARTHROSCOPY     RIGHT  . LEFT AND RIGHT HEART CATHETERIZATION WITH CORONARY ANGIOGRAM N/A 10/28/2013   Procedure: LEFT AND  RIGHT HEART CATHETERIZATION WITH CORONARY ANGIOGRAM;  Surgeon: Blane Ohara, MD;  Location: [facility] CATH LAB;  Service: Cardiovascular;  Laterality: N/A;  . LEFT HEART CATHETERIZATION WITH CORONARY ANGIOGRAM N/A 11/14/2014   Procedure: LEFT HEART CATHETERIZATION WITH CORONARY ANGIOGRAM;  Surgeon: Josue Hector, MD;  Location: [facility] CATH LAB;  Service: Cardiovascular;  Laterality: N/A;  . REVISION TOTAL KNEE ARTHROPLASTY  2011  . ROTATOR CUFF REPAIR  2010  . SHOULDER ARTHROSCOPY  09/17/2012   Procedure: ARTHROSCOPY SHOULDER;  Surgeon: Sharmon Revere, MD;  Location: [place];  Service: Orthopedics;  Laterality: Left;  . TONSILLECTOMY    . TONSILLECTOMY      There were no vitals filed for this visit.    Subjective Assessment - 09/21/20 1436    Subjective Pt reports chronic hip/thigh pain bil which has worsened since she has been unable to go to the gym due to [place].   She had injection in bil hips with has been somewhat beneficial.    Limitations Standing;Walking    Patient Stated Goals be able to learn how to use equipment safely to go to the gym    Currently in Pain? No/denies    Pain Score 0-No pain   no pain for about a week   Pain Onset More than a month ago    Pain Frequency Intermittent    Aggravating Factors  worse in AM    Pain Relieving Factors walking              [facility] PT Assessment - 09/21/20 1439      Assessment   Medical Diagnosis M70.61,M70.62 (ICD-10-CM) - Trochanteric bursitis of both hips    Referring Provider (PT) Gregor Hams, MD    Onset Date/Surgical Date --   since gyms closed due to [place]   Next MD Visit PRN    Prior Therapy n/a      Precautions   Precautions None      Restrictions   Weight Bearing Restrictions No      Balance Screen   Has the patient fallen in the past 6 months No    Has the patient had a decrease in activity level because of a fear of falling?  No    Is the patient reluctant to leave their home because of a fear of falling?  No      Home Environment   Living Environment Private residence    Living Arrangements Alone    Available Help at Discharge Family;Available PRN/intermittently    Type of Home House    Home Access Stairs to enter    Entrance Stairs-Number of Steps 2    Entrance Stairs-Rails Right;Left;Can reach both    Home Layout One level      Prior Function   Level of Independence Independent    Vocation Retired    [employer] retired from [employer] tend to house plants, prior to [employer]: Y 3x/wk: seated stepper, water 1x/wk, weight machines      Cognition   Overall Cognitive Status Within Functional Limits for tasks assessed      Observation/Other Assessments   Focus on Therapeutic Outcomes (FOTO)  36 (predicted 51)      ROM / Strength   AROM / PROM / Strength Strength      Strength   Overall Strength Comments bil LEs tested in sitting; grossly 4/5  on Rt; 3+/5 on Lt      Ambulation/Gait   Gait Comments amb with  RW with decreased gait velocity, no significant abnormalities noted                      Objective measurements completed on examination: See above findings.       [place] Adult PT Treatment/Exercise - 09/21/20 1439      Self-Care   Self-Care Other Self-Care Comments    Other Self-Care Comments  educated on recommendation for Healthy Weight and Wellness; exercises/water aerobics at [facility] and safe machines to perform at the gym.  Pt performed 10 reps of leg press 75#, and verbalized understanding of all other exercises                  PT Education - 09/21/20 1507    Education Details HEP - gym program    Person(s) Educated Patient    Methods Explanation;Demonstration;Handout    Comprehension Verbalized understanding               PT Long Term Goals - 09/21/20 1510      PT LONG TERM GOAL #1   Title to be determined if pt returns      PT LONG TERM GOAL #2   Title n/a                  Plan - 09/21/20 1507    Clinical Impression Statement Pt is an 86 y/o female who presents to OPPT for bil hip pain, now resolved and with bil LE weakness.  Pt requesting single visit to review gym exercises so performed today with good understanding.  Will leave chart open x 30 days, and pt to call if she needs to return.    Personal Factors and Comorbidities Comorbidity 3+    Comorbidities HTN, OSA, CHF, anxiety, OA, osteopenia, aortic stenosis, depression, seizure    Examination-Participation Restrictions Community Activity    Stability/Clinical Decision Making Evolving/Moderate complexity    Clinical Decision Making Moderate    Rehab Potential Good    PT Frequency --   up to 1x/wk   PT Duration 6 weeks    PT Treatment/Interventions ADLs/Self Care Home Management;Cryotherapy;Moist Heat;Gait training;Stair training;Functional mobility training;Therapeutic activities;Therapeutic exercise;Neuromuscular  re-education;Patient/family education;Manual techniques;Taping    PT Next Visit Plan reassess and write goals PRN    PT Home Exercise Plan Access Code: [email or web address]    Consulted and Agree with Plan of Care Patient           Patient will benefit from skilled therapeutic intervention in order to improve the following deficits and impairments:  Decreased strength, Difficulty walking, Decreased endurance  Visit Diagnosis: Pain in left hip - Plan: PT plan of care cert/re-cert  Pain in right hip - Plan: PT plan of care cert/re-cert  Muscle weakness (generalized) - Plan: PT plan of care cert/re-cert     Problem List Patient Active Problem List   Diagnosis Date Noted  . Encounter for palliative care involving management of pain 09/20/2020  . Pneumonia of right lower lobe due to infectious organism 09/20/2020  . Hypertensive urgency 08/11/2020  . Excessive somnolence disorder 04/12/2020  . Routine general medical examination at a health care facility 04/10/2020  . Thrombocytopenia ([place]) 04/10/2020  . Anxiety 08/26/2018  . Seizure ([place]) 08/25/2018  . GERD (gastroesophageal reflux disease) 08/25/2018  . Chronic diastolic CHF (congestive heart failure) ([place]) 08/25/2018  . Major depressive disorder, recurrent episode with anxious distress ([place]) 02/04/2017  . Dementia arising in the senium and presenium ([place]) 06/27/2016  . Obesity, Class II, BMI  35-39.9, with comorbidity 05/15/2016  . Insomnia w/ sleep apnea 10/16/2015  . IBS (irritable bowel syndrome) 02/27/2015  . Hashimoto's thyroiditis 05/05/2014  . Adrenal incidentaloma ([place]) 11/04/2013  . Benign paroxysmal positional vertigo 07/15/2013  . Aortic stenosis, mild 07/14/2013  . OAB (overactive bladder) 02/24/2013  . GERD with stricture 12/24/2012  . Sleep-related hypoventilation 07/30/2012  . OSA (obstructive sleep apnea) 04/16/2012  . Osteopenia 04/16/2012  . Preventative health care 05/24/2011  . Vitamin D deficiency 12/04/2010  .  Hyperlipidemia with target LDL less than 130 12/03/2010  . GAD (generalized anxiety disorder) 12/03/2010  . Essential hypertension 12/03/2010  . Osteoarthritis 12/03/2010      Laureen Abrahams, PT, DPT 09/22/20 11:03 AM     [facility] Physical Therapy [address] [place], [place], [address] Phone: [phone]   Fax:  [phone]  Name: [name] MRN: [id number] Date of Birth: Jan 25, 1933

## 2020-09-21 NOTE — Patient Instructions (Signed)
Access Code: [email or web address] URL: [email or web address] Date: 09/21/2020 Prepared by: Faustino Congress  Exercises Hamstring Curl with Weight Machine - 1 x daily - 3-4 x weekly - 3 sets - 10 reps Knee Extension with Weight Machine - 1 x daily - 3-4 x weekly - 3 sets - 10 reps Seated Row Cable Machine - 1 x daily - 3-4 x weekly - 3 sets - 10 reps Hip Abduction Machine - 1 x daily - 3-4 x weekly - 3 sets - 10 reps Hip Adduction Machine - 1 x daily - 3-4 x weekly - 3 sets - 10 reps Seated Leg Press - 1 x daily - 3-4 x weekly - 3 sets - 10 reps Recumbent Bike - 1 x daily - 3-4 x weekly - 1 sets

## 2020-09-26 ENCOUNTER — Encounter: Payer: Self-pay | Admitting: Cardiology

## 2020-09-26 ENCOUNTER — Ambulatory Visit: Payer: Medicare HMO | Admitting: Cardiology

## 2020-09-26 ENCOUNTER — Other Ambulatory Visit: Payer: Self-pay

## 2020-09-26 VITALS — BP 140/70 | HR 61 | Ht 62.0 in | Wt 218.4 lb

## 2020-09-26 DIAGNOSIS — Z7189 Other specified counseling: Secondary | ICD-10-CM

## 2020-09-26 DIAGNOSIS — I5032 Chronic diastolic (congestive) heart failure: Secondary | ICD-10-CM | POA: Diagnosis not present

## 2020-09-26 DIAGNOSIS — I350 Nonrheumatic aortic (valve) stenosis: Secondary | ICD-10-CM | POA: Diagnosis not present

## 2020-09-26 DIAGNOSIS — I10 Essential (primary) hypertension: Secondary | ICD-10-CM

## 2020-09-26 NOTE — Patient Instructions (Signed)
Medication Instructions:  Your Physician recommend you continue on your current medication as directed.    *If you need a refill on your cardiac medications before your next appointment, please call your pharmacy*   Lab Work: None ordered  Testing/Procedures: None ordered    Follow-Up: At [facility], you and your health needs are our priority.  As part of our continuing mission to provide you with exceptional heart care, we have created designated Provider Care Teams.  These Care Teams include your primary Cardiologist (physician) and Advanced Practice Providers (APPs -  Physician Assistants and Nurse Practitioners) who all work together to provide you with the care you need, when you need it.  We recommend signing up for the patient portal called "MyChart".  Sign up information is provided on this After Visit Summary.  MyChart is used to connect with patients for Virtual Visits (Telemedicine).  Patients are able to view lab/test results, encounter notes, upcoming appointments, etc.  Non-urgent messages can be sent to your provider as well.   To learn more about what you can do with MyChart, go to [email or web address].    Your next appointment:   6 month(s)  The format for your next appointment:   In Person  Provider:   [name], MD   Other [facility] [address], [place], [place] [address]  [facility] [address] [place], [place], [place] [address]

## 2020-09-26 NOTE — Progress Notes (Signed)
Cardiology Office Note:    Date:  09/26/2020   ID:  [name], DOB January 02, 1933, MRN [id number]  PCP:  [name], MD  Cardiologist:  [name], MD PhD  Referring MD: [name], MD   CC: follow up  History of Present Illness:    [name] is a 86 y.o. female with a hx of hypertension who is seen for follow up today. I initially met her 07/16/19 as a new consult at the request of [name], MD for the evaluation and management of aortic stenosis and concern for diastolic heart failure.  Today: Reviewed hospitalization for hypertensive urgency 07/2020. Checking blood pressure daily at home. Doesn't have log today. This AM, was 130/60 at home. Has had intermittent lows, last about a week ago, when systolic was 355D. Felt lightheaded. No syncope. Hasn't seen any recent high numbers at home since her hospitalization. Uses wrist cuff as she cannot get arm cuff over her elbow.  She is feeling only "so-so" today. Feels like she is on too much medication. Reports that blood pressure has been better controlled at home. She "hates" carvedilol but is willing to take it, doesn't like that she has to take with food. Likes torsemide daily, feels that does well for her. Is taking hydralazine 50 mg twice daily, does not like how it makes her feel. Does not recall ever taking isosorbide, called her pharmacy and they report never having filled it.   Interested in healthy weight and wellness, wants to lose weight in a healthy way. Using her CPAP at home. Doing physical therapy now to help with joint pain.  Does not feel like she is holding on to fluid. Watches her fluid intake. No significant edema. Breathing is stable.   Past Medical History:  Diagnosis Date  . Abdominal pain   . Anemia    NOS  . Anxiety   . Back pain   . Bruises easily   . Chronic diastolic CHF (congestive heart failure) ([place])    a. 06/2013 EF 65-70%.  . Chronic headaches    HISTORY OF   . Depression    . Diabetes mellitus    type II  DIET CONTROLLED  . Diarrhea   . Hepatitis    AGE 30S  . Hyperlipidemia   . Hypertension   . Mild aortic stenosis    a. 06/2013 Echo: EF 65-70%, mod LVH with focal basal hypertrophy, very mild AS.  Marland Kitchen Osteoarthritis   . Oxygen desaturation during sleep    USES 2 LITERS BEDTIME   VIA CPAP 06/2012 [facility]   . Shortness of breath    WITH EXERTION USES 2 L O2 BEDTIME  . Sleep apnea    CPAP WITH O2 2 LITERS 2013 ([facility])  . Wears dentures   . Wears glasses     Past Surgical History:  Procedure Laterality Date  . ABDOMINAL HYSTERECTOMY    . APPENDECTOMY    . CHOLECYSTECTOMY    . JOINT REPLACEMENT     LEFT KNEE   . KNEE ARTHROPLASTY Right 03/22/2015   Procedure: COMPUTER ASSISTED TOTAL KNEE ARTHROPLASTY;  Surgeon: Marybelle Killings, MD;  Location: [facility];  Service: Orthopedics;  Laterality: Right;  . KNEE ARTHROSCOPY     RIGHT  . LEFT AND RIGHT HEART CATHETERIZATION WITH CORONARY ANGIOGRAM N/A 10/28/2013   Procedure: LEFT AND RIGHT HEART CATHETERIZATION WITH CORONARY ANGIOGRAM;  Surgeon: Blane Ohara, MD;  Location: [facility] CATH LAB;  Service: Cardiovascular;  Laterality: N/A;  . LEFT HEART CATHETERIZATION WITH CORONARY ANGIOGRAM N/A 11/14/2014   Procedure: LEFT HEART CATHETERIZATION WITH CORONARY ANGIOGRAM;  Surgeon: Josue Hector, MD;  Location: [facility] CATH LAB;  Service: Cardiovascular;  Laterality: N/A;  . REVISION TOTAL KNEE ARTHROPLASTY  2011  . ROTATOR CUFF REPAIR  2010  . SHOULDER ARTHROSCOPY  09/17/2012   Procedure: ARTHROSCOPY SHOULDER;  Surgeon: Sharmon Revere, MD;  Location: [facility];  Service: Orthopedics;  Laterality: Left;  . TONSILLECTOMY    . TONSILLECTOMY      Current Medications: Current Outpatient Medications on File Prior to Visit  Medication Sig  . carvedilol (COREG) 12.5 MG tablet TAKE 1 TABLET(12.5 MG) BY MOUTH TWICE DAILY WITH A MEAL (Patient taking differently: Take 12.5 mg by mouth 2 (two) times daily with a meal. )  .  Cholecalciferol 2000 units TABS Take 2 tablets (4,000 Units total) by mouth daily.  . clonazePAM (KLONOPIN) 1 MG tablet TAKE 1 TABLET(1 MG) BY MOUTH TWICE DAILY AS NEEDED FOR ANXIETY (Patient taking differently: Take 1 mg by mouth 2 (two) times daily as needed for anxiety. )  . HYDROcodone-acetaminophen (NORCO/VICODIN) 5-325 MG tablet Take 1 tablet by mouth every 6 (six) hours as needed.  Marland Kitchen MYRBETRIQ 50 MG TB24 tablet TAKE 1 TABLET(50 MG) BY MOUTH DAILY  . omeprazole (PRILOSEC) 40 MG capsule TAKE 1 CAPSULE(40 MG) BY MOUTH DAILY (Patient taking differently: Take 40 mg by mouth daily. )  . spironolactone (ALDACTONE) 25 MG tablet Take 0.5 tablets (12.5 mg total) by mouth daily.  Marland Kitchen torsemide (DEMADEX) 20 MG tablet TAKE 1 TABLET(20 MG) BY MOUTH DAILY  . traZODone (DESYREL) 150 MG tablet Take 150 mg by mouth at bedtime.   Marland Kitchen albuterol (PROVENTIL) (2.5 MG/3ML) 0.083% nebulizer solution INHALE 3 MLS(2.5 MG TOTAL) BY NEBULIZATION TWICE DAILY AS NEEDED FOR WHEEZING OR SHORTNESS OF BREATH  . hydrALAZINE (APRESOLINE) 50 MG tablet Take 1 tablet (50 mg total) by mouth 2 (two) times daily.  . [DISCONTINUED] albuterol (PROVENTIL) (2.5 MG/3ML) 0.083% nebulizer solution INHALE 3 MLS(2.5 MG TOTAL) BY NEBULIZATION TWICE DAILY AS NEEDED FOR WHEEZING OR SHORTNESS OF BREATH (Patient taking differently: Take 2.5 mg by nebulization every 6 (six) hours as needed for wheezing or shortness of breath. )  . [DISCONTINUED] MYRBETRIQ 50 MG TB24 tablet Take 50 mg by mouth daily.  . [DISCONTINUED] omeprazole (PRILOSEC) 40 MG capsule Take 1 capsule (40 mg total) by mouth daily.   No current facility-administered medications on file prior to visit.     Allergies:   Amlodipine, Enalapril, Losartan potassium, Morphine and related, and Metformin   Social History   Tobacco Use  . Smoking status: Never Smoker  . Smokeless tobacco: Never Used  Vaping Use  . Vaping Use: Never used  Substance Use Topics  . Alcohol use: No  . Drug  use: No    Family History: The patient's family history includes Alcohol abuse in an other family member; Cancer in her father; Emphysema in her brother; Heart disease in her brother and sister; Hypertension in an other family member; Kidney disease in an other family member; Mental illness in an other family member.  ROS:   Please see the history of present illness.  Additional pertinent ROS otherwise unremarkable  EKGs/Labs/Other Studies Reviewed:    The following studies were reviewed today: Echo 07/22/19  1. The left ventricle has normal systolic function with an ejection fraction of 60-65%. The cavity size was normal. Severe basal septal hypertrophy. Left ventricular diastolic Doppler  parameters are consistent with pseudonormalization.  2. The right ventricle has normal systolic function. The cavity was normal. There is no increase in right ventricular wall thickness. Right ventricular systolic pressure could not be assessed.  3. There is mild mitral annular calcification present.  4. The aortic valve is tricuspid. Moderate calcification of the aortic valve. Aortic valve regurgitation is trivial by color flow Doppler.  5. The aorta is normal unless otherwise noted.  6. The interatrial septum appears to be lipomatous.  EKG:  EKG is personally reviewed.  The ekg ordered 08/11/20 demonstrates sinus rhythm at 67 bpm  Recent Labs: 04/10/2020: TSH 3.85 08/11/2020: ALT 14 08/12/2020: B Natriuretic Peptide 109.4 08/13/2020: BUN 15; Creatinine, Ser 0.92; Hemoglobin 11.8; Magnesium 1.6; Platelets 137; Potassium 3.9; Sodium 138  Recent Lipid Panel    Component Value Date/Time   CHOL 186 04/10/2020 1403   TRIG 110.0 04/10/2020 1403   HDL 57.90 04/10/2020 1403   CHOLHDL 3 04/10/2020 1403   VLDL 22.0 04/10/2020 1403   LDLCALC 106 (H) 04/10/2020 1403   LDLDIRECT 121.0 06/12/2017 1349    Physical Exam:    VS:  BP 140/70 (BP Location: Left Arm, Patient Position: Sitting)   Pulse 61   Ht 5'  2" (1.575 m)   Wt 218 lb 6.4 oz (99.1 kg)   SpO2 94%   BMI 39.95 kg/m     Wt Readings from Last 3 Encounters:  09/26/20 218 lb 6.4 oz (99.1 kg)  09/19/20 218 lb (98.9 kg)  08/30/20 218 lb (98.9 kg)    GEN: Well nourished, well developed in no acute distress HEENT: Normal, moist mucous membranes NECK: No JVD CARDIAC: regular rhythm, normal S1 and S2, no rubs or gallops. 2/6 systolic ejection murmur. VASCULAR: Radial and DP pulses 2+ bilaterally. No carotid bruits RESPIRATORY:  Clear to auscultation without rales, wheezing or rhonchi  ABDOMEN: Soft, non-tender, non-distended MUSCULOSKELETAL:  Ambulates independently SKIN: Warm and dry, no edema NEUROLOGIC:  Alert and oriented x 3. No focal neuro deficits noted. PSYCHIATRIC:  Normal affect   ASSESSMENT:    1. Chronic diastolic heart failure ([place])   2. Essential hypertension   3. Aortic stenosis, mild   4. Encounter for education about heart failure    PLAN:    Chronic diastolic heart failure:  -appears euvolemic on exam -reviewed heart failure education, including daily weights, salt avoidance -discussed the importance of good BP control  Hypertension: prior hospitalization for hypertensive urgency 07/2020 -continue carvedilol 12.5 mg BID. Avoid bradycardia due to diastolic dysfunction -continue spirinolactone 25 mg daily -continue torsemide 20 mg daily -continue hydralazine 50 mg BID -never had isosorbide filled  Murmur: aortic sclerosis with mild stenosis. Unchanged on exam today  Plan for follow up: 6 mos or sooner PRN  [name], MD, PhD [place]  [facility]   Medication Adjustments/Labs and Tests Ordered: Current medicines are reviewed at length with the patient today.  Concerns regarding medicines are outlined above.  No orders of the defined types were placed in this encounter.  No orders of the defined types were placed in this encounter.   Patient Instructions  Medication  Instructions:  Your Physician recommend you continue on your current medication as directed.    *If you need a refill on your cardiac medications before your next appointment, please call your pharmacy*   Lab Work: None ordered  Testing/Procedures: None ordered    Follow-Up: At [facility], you and your health needs are our priority.  As part of  our continuing mission to provide you with exceptional heart care, we have created designated Provider Care Teams.  These Care Teams include your primary Cardiologist (physician) and Advanced Practice Providers (APPs -  Physician Assistants and Nurse Practitioners) who all work together to provide you with the care you need, when you need it.  We recommend signing up for the patient portal called "MyChart".  Sign up information is provided on this After Visit Summary.  MyChart is used to connect with patients for Virtual Visits (Telemedicine).  Patients are able to view lab/test results, encounter notes, upcoming appointments, etc.  Non-urgent messages can be sent to your provider as well.   To learn more about what you can do with MyChart, go to [email or web address].    Your next appointment:   6 month(s)  The format for your next appointment:   In Person  Provider:   [name], MD   Other Instructions [facility] [address] [place], [place] [address]  [facility] [address] [place], [place], [place] [address]       Signed, [name], MD PhD 09/26/2020     [place]

## 2020-09-27 ENCOUNTER — Telehealth: Payer: Medicare HMO

## 2020-09-27 NOTE — Chronic Care Management (AMB) (Deleted)
Chronic Care Management Pharmacy  Name: [name]  MRN: [id number] DOB: 1933-01-28  Chief Complaint/ HPI  [name],  86 y.o. , female presents for their Follow-Up CCM visit with the clinical pharmacist via telephone due to COVID-19 Pandemic.  PCP : [name], MD  Their chronic conditions include: HTN, HLD, diastolic HF, OSA, GERD, IBS, dementia, osteoarthritis, OAB, depression/anxiety  Pt doesn't think she needs all the meds she's on. On CPAP now, still lacking energy, but decreased anxiety Trazodone helps with sleep, she thinks it is sapping her energy in the AM though  Office Visits: 09/19/20 Dr Ronnald Ramp OV: dc'd aspirin-caffeine (Excedrin).  05/24/20 Dr Ronnald Ramp OV: ground level fall, rx'd short course of Norco. Xrays negative for fracture  04/10/20 Dr Ronnald Ramp OV: started Nuvigil 50 mg - 2 tabs daily for fatigue. Needed PA, switched to modafinil 100 mg.  07/13/19 Dr Ronnald Ramp OV: SOB, nonproductive cough c/w pulmonary edema. Restarted torsemide. Reduced gabapentin to 100 mg BID.   Consult Visit: 09/26/20 Dr Harrell Gave (cardiology): continue same meds. Pt asking about stimulant (Nuvigil), pt told to d/w PCP.  08/24/20 Dr Georgina Snell (sports med): hip injection and hydrocodone rx. Referred to PT.  08/11/20-08/13/20 HOSPITAL ADMISSION for HTN urgency. Ruled out stroke. Controlled BP and discharged home.  03/30/20 Dr Harrell Gave (cardiology): daily weights, continue same meds. Pt asking about stimulant like Nuvigil, told to discuss with PCP.  03/09/20 Dr Gala Murdoch (pulmonary): 100% compliance with CPAP. Multiple awakenings d/t diurietic  02/29/20 Dr Georgina Snell (sports med): hip pain, injections and hydrocodone short course (#5).  12/20/19 Dr Ander Slade (pulmonary): moderate OSA, recommended CPAP.   Allergies  Allergen Reactions  . Amlodipine Other (See Comments)    Reaction:  Dizziness   . Enalapril Cough  . Losartan Potassium Other (See Comments)    Reaction:  GI pain   . Morphine And Related  Itching and Nausea And Vomiting  . Metformin Diarrhea    Medications: Outpatient Encounter Medications as of 09/27/2020  Medication Sig  . albuterol (PROVENTIL) (2.5 MG/3ML) 0.083% nebulizer solution INHALE 3 MLS(2.5 MG TOTAL) BY NEBULIZATION TWICE DAILY AS NEEDED FOR WHEEZING OR SHORTNESS OF BREATH  . carvedilol (COREG) 12.5 MG tablet TAKE 1 TABLET(12.5 MG) BY MOUTH TWICE DAILY WITH A MEAL (Patient taking differently: Take 12.5 mg by mouth 2 (two) times daily with a meal. )  . Cholecalciferol 2000 units TABS Take 2 tablets (4,000 Units total) by mouth daily.  . clonazePAM (KLONOPIN) 1 MG tablet TAKE 1 TABLET(1 MG) BY MOUTH TWICE DAILY AS NEEDED FOR ANXIETY (Patient taking differently: Take 1 mg by mouth 2 (two) times daily as needed for anxiety. )  . hydrALAZINE (APRESOLINE) 50 MG tablet Take 1 tablet (50 mg total) by mouth 2 (two) times daily.  Marland Kitchen HYDROcodone-acetaminophen (NORCO/VICODIN) 5-325 MG tablet Take 1 tablet by mouth every 6 (six) hours as needed.  Marland Kitchen MYRBETRIQ 50 MG TB24 tablet TAKE 1 TABLET(50 MG) BY MOUTH DAILY  . omeprazole (PRILOSEC) 40 MG capsule TAKE 1 CAPSULE(40 MG) BY MOUTH DAILY (Patient taking differently: Take 40 mg by mouth daily. )  . spironolactone (ALDACTONE) 25 MG tablet Take 0.5 tablets (12.5 mg total) by mouth daily.  Marland Kitchen torsemide (DEMADEX) 20 MG tablet TAKE 1 TABLET(20 MG) BY MOUTH DAILY  . traZODone (DESYREL) 150 MG tablet Take 150 mg by mouth at bedtime.   . [DISCONTINUED] albuterol (PROVENTIL) (2.5 MG/3ML) 0.083% nebulizer solution INHALE 3 MLS(2.5 MG TOTAL) BY NEBULIZATION TWICE DAILY AS NEEDED FOR WHEEZING OR SHORTNESS OF  BREATH (Patient taking differently: Take 2.5 mg by nebulization every 6 (six) hours as needed for wheezing or shortness of breath. )  . [DISCONTINUED] MYRBETRIQ 50 MG TB24 tablet Take 50 mg by mouth daily.  . [DISCONTINUED] omeprazole (PRILOSEC) 40 MG capsule Take 1 capsule (40 mg total) by mouth daily.   No facility-administered encounter  medications on file as of 09/27/2020.   Wt Readings from Last 3 Encounters:  09/26/20 218 lb 6.4 oz (99.1 kg)  09/19/20 218 lb (98.9 kg)  08/30/20 218 lb (98.9 kg)   Current Diagnosis/Assessment:   Goals Addressed   None     Heart Failure/Hypertension   Type: Diastolic  Last ejection fraction: 60-65% (07/22/2019)  BP goal: < 140/90  Office blood pressures are  BP Readings from Last 3 Encounters:  09/26/20 140/70  09/19/20 (!) 142/88  08/30/20 130/70   Pulse Readings from Last 3 Encounters:  09/26/20 61  09/19/20 66  08/30/20 60   Kidney Function Lab Results  Component Value Date/Time   CREATININE 0.92 08/13/2020 05:47 AM   CREATININE 0.87 08/12/2020 02:17 PM   GFR 84.90 04/10/2020 02:03 PM   GFRNONAA 57 (L) 08/13/2020 05:47 AM   GFRAA >60 08/13/2020 05:47 AM   Patient checks BP at home daily  Patient home BP readings are ranging: 130s-150/60s-70s  Patient has failed these meds in past: amlodipine, enalapril/HCTZ, furosemide, losartan, chlorthalidone  Patient is currently controlled on the following medications:   carvedilol 12.5 mg BID,   hydralazine 50 mg BID,   isosorbide MN 20 mg daily,   spironolactone 12.5 mg (1/2 of 25 mg) daily,   torsemide 20 mg daily  We discussed: Pt thinks she is taking too many medications; discussed BP goal, and if medication is removed her BP would likely rise well above goal. Pt does report dizziness upon standing sometimes; discussed standing slowly and using support (ie, cane) to move around to prevent falls. Also stressed adequate hydration.   Plan  Continue current medications and control with diet and exercise   Hyperlipidemia   LDL goal < 130  Lipid Panel     Component Value Date/Time   CHOL 186 04/10/2020 1403   TRIG 110.0 04/10/2020 1403   HDL 57.90 04/10/2020 1403   CHOLHDL 3 04/10/2020 1403   VLDL 22.0 04/10/2020 1403   LDLCALC 106 (H) 04/10/2020 1403   LDLDIRECT 121.0 06/12/2017 1349   ASCVD  10-year risk: n/a due to age   Patient has failed these meds in past: pravastatin Patient is currently controlled on the following medications: no meds  We discussed:  Diet and exercise; no statin indicated due to age.  Plan  Continue control with diet and exercise   Overactive bladder   Patient has failed these meds in past: Vesicare Patient is currently controlled on the following medications:   Vesicare 10 mg daily  Myrbetriq 50 mg daily  We discussed:  Pt started Vesicare last month - she reports it was not working alone, but does works better combined with Myrbetriq. Pt is willing to "pay the difference" to keep using Myrbetriq since she is getting up to urinate <2 times per night now.  Plan  Continue current medications   Anxiety/depression   Patient has failed these meds in past: gabapentin, duloxetine, hydroxyzine, alprazolam Patient is currently controlled on the following medications:   clonazepam 1 mg BID prn,   trazodone 100 mg HS prn  We discussed:  Pt reports grogginess in AM; she sleeps well through the  night with trazodone, and reports she has "experimented" with trazodone dose so she does not think trazodone is contributing to AM grogginess.  Discussed potential alternatives for anxiety that may also help with fatigue - bupropion may worsen anxiety, and venlafaxine may worsen blood pressure; an SSRI may improve sleep/wake cycle but could potentially worsen somnolence as well depending on how pt reacts. Ultimately decided to continue same medications and f/u with PCP if symptoms persist or worsen.  Plan  Continue clonazepam Recommend increasing trazodone to 150 mg HS  Chronic cough   Patient has failed these meds in past: benzonatate Patient is currently uncontrolled on the following medications:  . Cetirizine 10 mg - PRN  We discussed:  Pt tried cetirizine for dry unproductive cough at night; she reports it did not help much. Pt may try other OTC  antihistamines for marginal benefit.  Plan  Recommend OTC 2nd gen antihistamine (Claritin, Allegra)   Medication Management   Pt uses [place] for all medications Uses 7-day pill box - fills every Saturday Pt endorses 100% compliance  We discussed:  Pt would like to switch to mail order pharmacy for cheaper [place] preferred pharmacy to [facility] mail order    Follow up: *** month phone visit  Charlene Brooke, PharmD, BCACP Clinical Pharmacist [facility] [phone]

## 2020-10-02 ENCOUNTER — Encounter: Payer: Self-pay | Admitting: Physical Therapy

## 2020-10-02 ENCOUNTER — Ambulatory Visit: Payer: Medicare HMO | Admitting: Physical Therapy

## 2020-10-02 ENCOUNTER — Other Ambulatory Visit: Payer: Self-pay

## 2020-10-02 DIAGNOSIS — M25552 Pain in left hip: Secondary | ICD-10-CM | POA: Diagnosis not present

## 2020-10-02 DIAGNOSIS — M6281 Muscle weakness (generalized): Secondary | ICD-10-CM | POA: Diagnosis not present

## 2020-10-02 DIAGNOSIS — M25551 Pain in right hip: Secondary | ICD-10-CM

## 2020-10-02 NOTE — Patient Instructions (Signed)
Access Code: [email or web address] URL: [email or web address] Date: 10/02/2020 Prepared by: Kearney Hard  Exercises Seated Leg Extension with Resistance - AFO - 2 x daily - 7 x weekly - 2 sets - 10 reps Seated Hip Abduction - 2 x daily - 7 x weekly - 2 sets - 10 reps Sit to stand using UE support x 10.

## 2020-10-02 NOTE — Therapy (Signed)
[facility] [address] [place], [place], [address] Phone: [phone]   Fax:  [phone]  Physical Therapy Treatment Re-assessment   Patient Details  Name: [name] MRN: [id number] Date of Birth: [birth year] Referring Provider (PT): Gregor Hams, MD   Encounter Date: 10/02/2020   PT End of Session - 10/02/20 1156    Visit Number 2    Number of Visits 12    Date for PT Re-Evaluation 11/03/20    PT Start Time 1146    PT Stop Time 1226    PT Time Calculation (min) 40 min    Activity Tolerance Patient tolerated treatment well    Behavior During Therapy [facility] for tasks assessed/performed           Past Medical History:  Diagnosis Date   Abdominal pain    Anemia    NOS   Anxiety    Back pain    Bruises easily    Chronic diastolic CHF (congestive heart failure) ([place])    a. 06/2013 EF 65-70%.   Chronic headaches    HISTORY OF    Depression    Diabetes mellitus    type II  DIET CONTROLLED   Diarrhea    Hepatitis    AGE 30S   Hyperlipidemia    Hypertension    Mild aortic stenosis    a. 06/2013 Echo: EF 65-70%, mod LVH with focal basal hypertrophy, very mild AS.   Osteoarthritis    Oxygen desaturation during sleep    USES 2 LITERS BEDTIME   VIA CPAP 06/2012 [facility]    Shortness of breath    WITH EXERTION USES 2 L O2 BEDTIME   Sleep apnea    CPAP WITH O2 2 LITERS 2013 ([facility])   Wears dentures    Wears glasses     Past Surgical History:  Procedure Laterality Date   ABDOMINAL HYSTERECTOMY     APPENDECTOMY     CHOLECYSTECTOMY     JOINT REPLACEMENT     LEFT KNEE    KNEE ARTHROPLASTY Right 03/22/2015   Procedure: COMPUTER ASSISTED TOTAL KNEE ARTHROPLASTY;  Surgeon: Marybelle Killings, MD;  Location: [facility];  Service: Orthopedics;  Laterality: Right;   KNEE ARTHROSCOPY     RIGHT   LEFT AND RIGHT HEART CATHETERIZATION WITH CORONARY ANGIOGRAM N/A 10/28/2013   Procedure: LEFT AND RIGHT HEART  CATHETERIZATION WITH CORONARY ANGIOGRAM;  Surgeon: Blane Ohara, MD;  Location[facility];  Service: Cardiovascular;  Laterality: N/A;   LEFT HEART CATHETERIZATION WITH CORONARY ANGIOGRAM N/A 11/14/2014   Procedure: LEFT HEART CATHETERIZATION WITH CORONARY ANGIOGRAM;  Surgeon: Josue Hector, MD;  Location: [facility] CATH LAB;  Service: Cardiovascular;  Laterality: N/A;   REVISION TOTAL KNEE ARTHROPLASTY  2011   ROTATOR CUFF REPAIR  2010   SHOULDER ARTHROSCOPY  09/17/2012   Procedure: ARTHROSCOPY SHOULDER;  Surgeon: Sharmon Revere, MD;  Location: [place];  Service: Orthopedics;  Laterality: Left;   TONSILLECTOMY     TONSILLECTOMY      There were no vitals filed for this visit.   Subjective Assessment - 10/02/20 1151    Subjective Pt arriving to therapy reporting SOB when walking and pain in bilateral hips and weakness noted  bilateral thighs. Pt reporting difficulty with standing while cooking and with ADL's. Pt reporting receiving injections in bilateral hips.    Limitations Standing;Walking    Patient Stated Goals be able to learn how to use equipment safely to go to the  gym    Currently in Pain? Yes    Pain Location Hip    Pain Orientation Right;Left    Pain Descriptors / Indicators Discomfort    Pain Type Acute pain    Pain Onset More than a month ago    Pain Frequency Intermittent    Effect of Pain on Daily Activities difficulty with prolonged standing, difficulty with ALD's,              [facility] PT Assessment - 10/02/20 0001      Assessment   Medical Diagnosis M70.61,M70.62 (ICD-10-CM) - Trochanteric bursitis of both hips    Referring Provider (PT) Gregor Hams, MD      [place] residence    Living Arrangements Alone    Available Help at Discharge Family;Available PRN/intermittently    Type of Home House    Home Access Stairs to enter    Entrance Stairs-Number of Steps 2    Entrance Stairs-Rails Right;Left;Can reach both     Home Layout One level      Prior Function   Level of Independence Independent    Vocation Retired    [employer] retired from [employer] tend to house plants, prior to [employer]: Y 3x/wk: seated stepper, water 1x/wk, weight machines      Observation/Other Assessments   Focus on Therapeutic Outcomes (FOTO)  36 (predicted 51)      ROM / Strength   AROM / PROM / Strength Strength      Strength   Overall Strength Comments R hip grossly 4/5, L hip  grossly 3+/5      Palpation   Palpation comment TTP: posterior lateral GH joint on L side      Special Tests    Special Tests Hip Special Tests    Hip Special Tests  Saralyn Pilar (FABER) Test;Thomas Test;Ober's Test;Hip Scouring      Saralyn Pilar (FABER) Test   Findings Negative    Comments bilateral sides      Thomas Test    Findings Positive    Side Right;Left      Ober's Test   Findings Positive    Side Right;Left      Hip Scouring   Findings Negative    Side Right;Left      Transfers   Five time sit to stand comments  25 seconds with UE support      Ambulation/Gait   Gait Comments amb with RW with decreased gait velocity, no significant abnormalities noted                         [facility] Adult PT Treatment/Exercise - 10/02/20 0001      Exercises   Exercises Knee/Hip      Knee/Hip Exercises: Stretches   Hip Flexor Stretch Both;2 reps;20 seconds    ITB Stretch Both;2 reps;20 seconds    Piriformis Stretch Both;2 reps;20 seconds      Knee/Hip Exercises: Seated   Ball Squeeze x 15 holding 5 seconds    Clamshell with TheraBand Green    Sit to [employer] 15 reps;with UE support      Knee/Hip Exercises: Supine   Bridges AROM;Strengthening;5 reps;Limitations    Bridges Limitations hamstring crampting noted, decreased lift off      Knee/Hip Exercises: Sidelying   Clams 15 reps bilateral LE's                  PT Education -  10/02/20 1155    Education Details HEP    Person(s) Educated  Patient    Methods Explanation;Demonstration;Handout    Comprehension Verbalized understanding;Returned demonstration               PT Long Term Goals - 10/02/20 1220      PT LONG TERM GOAL #1   Title Pt will be independent in her HEP and progression for bilateral LE's.    Status New    Target Date 11/17/20      PT LONG TERM GOAL #2   Title Pt will be able to perform 5 time sit to stand in less than 15 seconds with UE support as needed.    Baseline 25 seconds with UE support    Time 6    Period Weeks    Status New      PT LONG TERM GOAL #3   Title Pt will be able to stand 20 minutes with pain </= 2/ 10 in order to prepare meals.    Time 6    Period Weeks    Status New    Target Date 11/17/20      PT LONG TERM GOAL #4   Title Pt will improve her bilateral LE's to grossly 4+/5 in order to improve functional mobility and gait.    Time 6    Period Weeks    Status New    Target Date 11/17/20                 Plan - 10/02/20 1239    Clinical Impression Statement Pt is an 51 year hold female who presents today reporting bilateral hip pain and LE weakness. I feel pt could benefit from skilled therapy to address difficulty with ALD's and amb progressing toward new goals set at this session. I am requesting 2x/ week for 6 weeks.    Personal Factors and Comorbidities Comorbidity 3+    Comorbidities HTN, OSA, CHF, anxiety, OA, osteopenia, aortic stenosis, depression, seizure    Examination-Participation Restrictions Community Activity    Stability/Clinical Decision Making Evolving/Moderate complexity    Rehab Potential Good    PT Frequency 2x / week    PT Duration 6 weeks    PT Treatment/Interventions ADLs/Self Care Home Management;Cryotherapy;Moist Heat;Gait training;Stair training;Functional mobility training;Therapeutic activities;Therapeutic exercise;Neuromuscular re-education;Patient/family education;Manual techniques;Taping;Balance training;Passive range of motion     PT Next Visit Plan continue to add to pt's HEP for LE strengthening    PT Home Exercise Plan Access Code: [email or web address]    Consulted and Agree with Plan of Care Patient           Patient will benefit from skilled therapeutic intervention in order to improve the following deficits and impairments:  Decreased strength, Difficulty walking, Decreased endurance, Pain, Decreased balance, Decreased mobility  Visit Diagnosis: Pain in left hip  Pain in right hip  Muscle weakness (generalized)     Problem List Patient Active Problem List   Diagnosis Date Noted   Encounter for palliative care involving management of pain 09/20/2020   Pneumonia of right lower lobe due to infectious organism 09/20/2020   Hypertensive urgency 08/11/2020   Excessive somnolence disorder 04/12/2020   Routine general medical examination at a health care facility 04/10/2020   Thrombocytopenia ([place]) 04/10/2020   Anxiety 08/26/2018   Seizure ([place]) 08/25/2018   GERD (gastroesophageal reflux disease) 08/25/2018   Chronic diastolic CHF (congestive heart failure) ([place]) 08/25/2018   Major depressive disorder, recurrent episode with anxious distress ([place]) 02/04/2017   Dementia  arising in the senium and presenium ([place]) 06/27/2016   Obesity, Class II, BMI 35-39.9, with comorbidity 05/15/2016   Insomnia w/ sleep apnea 10/16/2015   IBS (irritable bowel syndrome) 02/27/2015   Hashimoto's thyroiditis 05/05/2014   Adrenal incidentaloma ([place]) 11/04/2013   Benign paroxysmal positional vertigo 07/15/2013   Aortic stenosis, mild 07/14/2013   OAB (overactive bladder) 02/24/2013   GERD with stricture 12/24/2012   Sleep-related hypoventilation 07/30/2012   OSA (obstructive sleep apnea) 04/16/2012   Osteopenia 04/16/2012   Preventative health care 05/24/2011   Vitamin D deficiency 12/04/2010   Hyperlipidemia with target LDL less than 130 12/03/2010   GAD (generalized anxiety disorder) 12/03/2010    Essential hypertension 12/03/2010   Osteoarthritis 12/03/2010    Oretha Caprice, PT, MPT 10/02/2020, 12:42 PM  [facility] [address] [place], [place], [address] Phone: [phone]   Fax:  [phone]  Name: [name] MRN: [id number] Date of Birth: [birth year]

## 2020-10-05 DIAGNOSIS — G4733 Obstructive sleep apnea (adult) (pediatric): Secondary | ICD-10-CM | POA: Diagnosis not present

## 2020-10-06 ENCOUNTER — Ambulatory Visit: Payer: Medicare HMO | Admitting: Physical Therapy

## 2020-10-06 ENCOUNTER — Encounter: Payer: Self-pay | Admitting: Physical Therapy

## 2020-10-06 ENCOUNTER — Other Ambulatory Visit: Payer: Self-pay

## 2020-10-06 DIAGNOSIS — M25552 Pain in left hip: Secondary | ICD-10-CM | POA: Diagnosis not present

## 2020-10-06 DIAGNOSIS — M6281 Muscle weakness (generalized): Secondary | ICD-10-CM | POA: Diagnosis not present

## 2020-10-06 DIAGNOSIS — M25551 Pain in right hip: Secondary | ICD-10-CM | POA: Diagnosis not present

## 2020-10-06 NOTE — Therapy (Signed)
[facility] [address] [place], [place], [address] Phone: [phone]   Fax:  [phone]  Physical Therapy Treatment  Patient Details  Name: [name] MRN: [id number] Date of Birth: Sep 08, 1933 Referring Provider (PT): Gregor Hams, MD   Encounter Date: 10/06/2020   PT End of Session - 10/06/20 1055    Visit Number 3    Number of Visits 12    Date for PT Re-Evaluation 11/03/20    PT Start Time 5009    PT Stop Time 1055    PT Time Calculation (min) 40 min    Activity Tolerance Patient tolerated treatment well    Behavior During Therapy [facility] for tasks assessed/performed           Past Medical History:  Diagnosis Date  . Abdominal pain   . Anemia    NOS  . Anxiety   . Back pain   . Bruises easily   . Chronic diastolic CHF (congestive heart failure) ([place])    a. 06/2013 EF 65-70%.  . Chronic headaches    HISTORY OF   . Depression   . Diabetes mellitus    type II  DIET CONTROLLED  . Diarrhea   . Hepatitis    AGE 30S  . Hyperlipidemia   . Hypertension   . Mild aortic stenosis    a. 06/2013 Echo: EF 65-70%, mod LVH with focal basal hypertrophy, very mild AS.  Marland Kitchen Osteoarthritis   . Oxygen desaturation during sleep    USES 2 LITERS BEDTIME   VIA CPAP 06/2012 [facility]   . Shortness of breath    WITH EXERTION USES 2 L O2 BEDTIME  . Sleep apnea    CPAP WITH O2 2 LITERS 2013 ([facility])  . Wears dentures   . Wears glasses     Past Surgical History:  Procedure Laterality Date  . ABDOMINAL HYSTERECTOMY    . APPENDECTOMY    . CHOLECYSTECTOMY    . JOINT REPLACEMENT     LEFT KNEE   . KNEE ARTHROPLASTY Right 03/22/2015   Procedure: COMPUTER ASSISTED TOTAL KNEE ARTHROPLASTY;  Surgeon: Marybelle Killings, MD;  Location: [place];  Service: Orthopedics;  Laterality: Right;  . KNEE ARTHROSCOPY     RIGHT  . LEFT AND RIGHT HEART CATHETERIZATION WITH CORONARY ANGIOGRAM N/A 10/28/2013   Procedure: LEFT AND RIGHT HEART CATHETERIZATION WITH  CORONARY ANGIOGRAM;  Surgeon: Blane Ohara, MD;  Location: [facility] C[facility] LAB;  Service: Cardiovascular;  Laterality: N/A;  . LEFT HEART CATHETERIZATION WITH CORONARY ANGIOGRAM N/A 11/14/2014   Procedure: LEFT HEART CATHETERIZATION WITH CORONARY ANGIOGRAM;  Surgeon: Josue Hector, MD;  Location: [facility] LAB;  Service: Cardiovascular;  Laterality: N/A;  . REVISION TOTAL KNEE ARTHROPLASTY  2011  . ROTATOR CUFF REPAIR  2010  . SHOULDER ARTHROSCOPY  09/17/2012   Procedure: ARTHROSCOPY SHOULDER;  Surgeon: Sharmon Revere, MD;  Location: [place];  Service: Orthopedics;  Laterality: Left;  . TONSILLECTOMY    . TONSILLECTOMY      There were no vitals filed for this visit.   Subjective Assessment - 10/06/20 1019    Subjective doing well, legs are sore from exercise    Limitations Standing;Walking    Patient Stated Goals be able to learn how to use equipment safely to go to the gym    Currently in Pain? No/denies  [place] Adult PT Treatment/Exercise - 10/06/20 1020      Knee/Hip Exercises: Stretches   Other Knee/Hip Stretches SKTC 3x30 sec bil      Knee/Hip Exercises: Aerobic   Nustep L5 x 8 min      Knee/Hip Exercises: Seated   Other Seated Knee/Hip Exercises seated hip/knee extension x20 reps bil; L3 band    Sit to Sand 2 sets;10 reps;with UE support      Knee/Hip Exercises: Supine   Bridges 2 sets;10 reps    Other Supine Knee/Hip Exercises single limb clamshell L3 x 20 reps bil                       PT Long Term Goals - 10/02/20 1220      PT LONG TERM GOAL #1   Title Pt will be independent in her HEP and progression for bilateral LE's.    Status New    Target Date 11/17/20      PT LONG TERM GOAL #2   Title Pt will be able to perform 5 time sit to stand in less than 15 seconds with UE support as needed.    Baseline 25 seconds with UE support    Time 6    Period Weeks    Status New      PT LONG TERM GOAL #3   Title Pt  will be able to stand 20 minutes with pain </= 2/ 10 in order to prepare meals.    Time 6    Period Weeks    Status New    Target Date 11/17/20      PT LONG TERM GOAL #4   Title Pt will improve her bilateral LE's to grossly 4+/5 in order to improve functional mobility and gait.    Time 6    Period Weeks    Status New    Target Date 11/17/20                 Plan - 10/06/20 1055    Clinical Impression Statement Pt tolerated session well today without increase in pain today.  Overall will continue to benefit from PT to maximize function.    Personal Factors and Comorbidities Comorbidity 3+    Comorbidities HTN, OSA, CHF, anxiety, OA, osteopenia, aortic stenosis, depression, seizure    Examination-Participation Restrictions Community Activity    Stability/Clinical Decision Making Evolving/Moderate complexity    Rehab Potential Good    PT Frequency 2x / week    PT Duration 6 weeks    PT Treatment/Interventions ADLs/Self Care Home Management;Cryotherapy;Moist Heat;Gait training;Stair training;Functional mobility training;Therapeutic activities;Therapeutic exercise;Neuromuscular re-education;Patient/family education;Manual techniques;Taping;Balance training;Passive range of motion    PT Next Visit Plan continue to add to pt's HEP for LE strengthening, try leg press    PT Home Exercise Plan Access Code: [email or web address]    Consulted and Agree with Plan of Care Patient           Patient will benefit from skilled therapeutic intervention in order to improve the following deficits and impairments:  Decreased strength, Difficulty walking, Decreased endurance, Pain, Decreased balance, Decreased mobility  Visit Diagnosis: Pain in left hip  Pain in right hip  Muscle weakness (generalized)     Problem List Patient Active Problem List   Diagnosis Date Noted  . Encounter for palliative care involving management of pain 09/20/2020  . Pneumonia of right lower lobe due to infectious  organism 09/20/2020  . Hypertensive urgency 08/11/2020  . Excessive somnolence disorder  04/12/2020  . Routine general medical examination at a health care facility 04/10/2020  . Thrombocytopenia ([place]) 04/10/2020  . Anxiety 08/26/2018  . Seizure ([place]) 08/25/2018  . GERD (gastroesophageal reflux disease) 08/25/2018  . Chronic diastolic CHF (congestive heart failure) ([place]) 08/25/2018  . Major depressive disorder, recurrent episode with anxious distress ([place]) 02/04/2017  . Dementia arising in the senium and presenium ([place]) 06/27/2016  . Obesity, Class II, BMI 35-39.9, with comorbidity 05/15/2016  . Insomnia w/ sleep apnea 10/16/2015  . IBS (irritable bowel syndrome) 02/27/2015  . Hashimoto's thyroiditis 05/05/2014  . Adrenal incidentaloma ([place]) 11/04/2013  . Benign paroxysmal positional vertigo 07/15/2013  . Aortic stenosis, mild 07/14/2013  . OAB (overactive bladder) 02/24/2013  . GERD with stricture 12/24/2012  . Sleep-related hypoventilation 07/30/2012  . OSA (obstructive sleep apnea) 04/16/2012  . Osteopenia 04/16/2012  . Preventative health care 05/24/2011  . Vitamin D deficiency 12/04/2010  . Hyperlipidemia with target LDL less than 130 12/03/2010  . GAD (generalized anxiety disorder) 12/03/2010  . Essential hypertension 12/03/2010  . Osteoarthritis 12/03/2010     Laureen Abrahams, PT, DPT 10/06/20 10:56 AM     [facility] [address] [place], [place], [address] Phone: [phone]   Fax:  [phone]  Name: [name] MRN: [id number] Date of Birth: 07/25/1933

## 2020-10-10 ENCOUNTER — Other Ambulatory Visit: Payer: Self-pay

## 2020-10-10 ENCOUNTER — Ambulatory Visit: Payer: Medicare HMO | Admitting: Physical Therapy

## 2020-10-10 ENCOUNTER — Encounter: Payer: Self-pay | Admitting: Physical Therapy

## 2020-10-10 VITALS — BP 186/100

## 2020-10-10 DIAGNOSIS — M25551 Pain in right hip: Secondary | ICD-10-CM

## 2020-10-10 DIAGNOSIS — M6281 Muscle weakness (generalized): Secondary | ICD-10-CM

## 2020-10-10 DIAGNOSIS — M25552 Pain in left hip: Secondary | ICD-10-CM

## 2020-10-10 NOTE — Therapy (Signed)
[facility] Therapy [address] [place], [place], [address] Phone: [phone]   Fax:  [phone]  Physical Therapy Treatment  Patient Details  Name: [name] MRN: [id number] Date of Birth: October 21, 1933 Referring Provider (PT): Gregor Hams, MD   Encounter Date: 10/10/2020   PT End of Session - 10/10/20 1330    Visit Number 4    Number of Visits 12    Date for PT Re-Evaluation 11/03/20    PT Start Time 1300   session ended early due to medical condition   PT Stop Time 1320    PT Time Calculation (min) 20 min    Activity Tolerance Treatment limited secondary to medical complications (Comment)    Behavior During Therapy [facility] for tasks assessed/performed           Past Medical History:  Diagnosis Date  . Abdominal pain   . Anemia    NOS  . Anxiety   . Back pain   . Bruises easily   . Chronic diastolic CHF (congestive heart failure) ([place])    a. 06/2013 EF 65-70%.  . Chronic headaches    HISTORY OF   . Depression   . Diabetes mellitus    type II  DIET CONTROLLED  . Diarrhea   . Hepatitis    AGE 30S  . Hyperlipidemia   . Hypertension   . Mild aortic stenosis    a. 06/2013 Echo: EF 65-70%, mod LVH with focal basal hypertrophy, very mild AS.  Marland Kitchen Osteoarthritis   . Oxygen desaturation during sleep    USES 2 LITERS BEDTIME   VIA CPAP 06/2012 [facility]   . Shortness of breath    WITH EXERTION USES 2 L O2 BEDTIME  . Sleep apnea    CPAP WITH O2 2 LITERS 2013 ([facility])  . Wears dentures   . Wears glasses     Past Surgical History:  Procedure Laterality Date  . ABDOMINAL HYSTERECTOMY    . APPENDECTOMY    . CHOLECYSTECTOMY    . JOINT REPLACEMENT     LEFT KNEE   . KNEE ARTHROPLASTY Right 03/22/2015   Procedure: COMPUTER ASSISTED TOTAL KNEE ARTHROPLASTY;  Surgeon: Marybelle Killings, MD;  Location: [place];  Service: Orthopedics;  Laterality: Right;  . KNEE ARTHROSCOPY     RIGHT  . LEFT AND RIGHT HEART CATHETERIZATION WITH CORONARY ANGIOGRAM  N/A 10/28/2013   Procedure: LEFT AND RIGHT HEART CATHETERIZATION WITH CORONARY ANGIOGRAM;  Surgeon: Blane Ohara, MD;  Location: [facility] CATH LAB;  Service: Cardiovascular;  Laterality: N/A;  . LEFT HEART CATHETERIZATION WITH CORONARY ANGIOGRAM N/A 11/14/2014   Procedure: LEFT HEART CATHETERIZATION WITH CORONARY ANGIOGRAM;  Surgeon: Josue Hector, MD;  Location: [facility] CATH LAB;  Service: Cardiovascular;  Laterality: N/A;  . REVISION TOTAL KNEE ARTHROPLASTY  2011  . ROTATOR CUFF REPAIR  2010  . SHOULDER ARTHROSCOPY  09/17/2012   Procedure: ARTHROSCOPY SHOULDER;  Surgeon: Sharmon Revere, MD;  Location: [place];  Service: Orthopedics;  Laterality: Left;  . TONSILLECTOMY    . TONSILLECTOMY      Vitals:   10/10/20 1304 10/10/20 1329  BP: (!) 192/109 (!) 186/100     Subjective Assessment - 10/10/20 1304    Subjective she was nauseous this morning, and when that happens she gets SOB    Limitations Standing;Walking    Patient Stated Goals be able to learn how to use equipment safely to go to the gym    Currently in Pain? No/denies  PT Education - 10/10/20 1329    Education Details warning signs and indications to seek emergent medical care    Person(s) Educated Patient    Methods Explanation    Comprehension Verbalized understanding               PT Long Term Goals - 10/02/20 1220      PT LONG TERM GOAL #1   Title Pt will be independent in her HEP and progression for bilateral LE's.    Status New    Target Date 11/17/20      PT LONG TERM GOAL #2   Title Pt will be able to perform 5 time sit to stand in less than 15 seconds with UE support as needed.    Baseline 25 seconds with UE support    Time 6    Period Weeks    Status New      PT LONG TERM GOAL #3   Title Pt will be able to stand 20 minutes with pain </= 2/ 10 in order to prepare meals.    Time 6    Period Weeks    Status New    Target Date 11/17/20       PT LONG TERM GOAL #4   Title Pt will improve her bilateral LE's to grossly 4+/5 in order to improve functional mobility and gait.    Time 6    Period Weeks    Status New    Target Date 11/17/20                 Plan - 10/10/20 1330    Clinical Impression Statement Pt reported increased SOB and some nausea earlier this morning, and unable to take BP meds.  Vitals showed elevated BP - pt had medications with her so she took them before leaving.  After rest BP still elevated, and offered to call PCP but pt declined.  Reviewed indications to see emergent medical care, and to monitor BP and call MD if it is still elevated after taking medications.  She reported feeling better when leaving, but mild SOB still noted.    Personal Factors and Comorbidities Comorbidity 3+    Comorbidities HTN, OSA, CHF, anxiety, OA, osteopenia, aortic stenosis, depression, seizure    Examination-Participation Restrictions Community Activity    Stability/Clinical Decision Making Evolving/Moderate complexity    Rehab Potential Good    PT Frequency 2x / week    PT Duration 6 weeks    PT Treatment/Interventions ADLs/Self Care Home Management;Cryotherapy;Moist Heat;Gait training;Stair training;Functional mobility training;Therapeutic activities;Therapeutic exercise;Neuromuscular re-education;Patient/family education;Manual techniques;Taping;Balance training;Passive range of motion    PT Next Visit Plan continue to add to pt's HEP for LE strengthening, try leg press; see how BP is and how she's feeling    PT Home Exercise Plan Access Code: [email or web address]    Consulted and Agree with Plan of Care Patient           Patient will benefit from skilled therapeutic intervention in order to improve the following deficits and impairments:  Decreased strength, Difficulty walking, Decreased endurance, Pain, Decreased balance, Decreased mobility  Visit Diagnosis: Pain in left hip  Pain in right hip  Muscle weakness  (generalized)     Problem List Patient Active Problem List   Diagnosis Date Noted  . Encounter for palliative care involving management of pain 09/20/2020  . Pneumonia of right lower lobe due to infectious organism 09/20/2020  . Hypertensive urgency 08/11/2020  . Excessive somnolence disorder 04/12/2020  . Routine  general medical examination at a health care facility 04/10/2020  . Thrombocytopenia ([place]) 04/10/2020  . Anxiety 08/26/2018  . Seizure ([place]) 08/25/2018  . GERD (gastroesophageal reflux disease) 08/25/2018  . Chronic diastolic CHF (congestive heart failure) ([place]) 08/25/2018  . Major depressive disorder, recurrent episode with anxious distress ([place]) 02/04/2017  . Dementia arising in the senium and presenium ([place]) 06/27/2016  . Obesity, Class II, BMI 35-39.9, with comorbidity 05/15/2016  . Insomnia w/ sleep apnea 10/16/2015  . IBS (irritable bowel syndrome) 02/27/2015  . Hashimoto's thyroiditis 05/05/2014  . Adrenal incidentaloma ([place]) 11/04/2013  . Benign paroxysmal positional vertigo 07/15/2013  . Aortic stenosis, mild 07/14/2013  . OAB (overactive bladder) 02/24/2013  . GERD with stricture 12/24/2012  . Sleep-related hypoventilation 07/30/2012  . OSA (obstructive sleep apnea) 04/16/2012  . Osteopenia 04/16/2012  . Preventative health care 05/24/2011  . Vitamin D deficiency 12/04/2010  . Hyperlipidemia with target LDL less than 130 12/03/2010  . GAD (generalized anxiety disorder) 12/03/2010  . Essential hypertension 12/03/2010  . Osteoarthritis 12/03/2010      Laureen Abrahams, PT, DPT 10/10/20 1:33 PM    [facility] [address] [place], [place], [address] Phone: [phone]   Fax:  [phone]  Name: JATZIRI GOFFREDO MRN: [id number] Date of Birth: [birth year]

## 2020-10-11 ENCOUNTER — Ambulatory Visit: Payer: Medicare HMO | Admitting: Internal Medicine

## 2020-10-13 ENCOUNTER — Ambulatory Visit: Payer: Medicare HMO | Admitting: Physical Therapy

## 2020-10-13 ENCOUNTER — Other Ambulatory Visit: Payer: Self-pay

## 2020-10-13 ENCOUNTER — Encounter: Payer: Self-pay | Admitting: Physical Therapy

## 2020-10-13 DIAGNOSIS — M6281 Muscle weakness (generalized): Secondary | ICD-10-CM

## 2020-10-13 DIAGNOSIS — M25551 Pain in right hip: Secondary | ICD-10-CM | POA: Diagnosis not present

## 2020-10-13 DIAGNOSIS — M25552 Pain in left hip: Secondary | ICD-10-CM

## 2020-10-13 NOTE — Therapy (Signed)
[facility] Physical Therapy [address] [place], [place], [address] Phone: [phone]   Fax:  [phone]  Physical Therapy Treatment  Patient Details  Name: [name] MRN: [id number] Date of Birth: Jan 29, 1933 Referring Provider (PT): Gregor Hams, MD   Encounter Date: 10/13/2020   PT End of Session - 10/13/20 1132    Visit Number 5    Number of Visits 12    Date for PT Re-Evaluation 11/03/20    PT Start Time 1055    PT Stop Time 1127   pt requested to end early   PT Time Calculation (min) 32 min    Activity Tolerance Treatment limited secondary to medical complications (Comment)    Behavior During Therapy [facility] for tasks assessed/performed           Past Medical History:  Diagnosis Date  . Abdominal pain   . Anemia    NOS  . Anxiety   . Back pain   . Bruises easily   . Chronic diastolic CHF (congestive heart failure) ([place])    a. 06/2013 EF 65-70%.  . Chronic headaches    HISTORY OF   . Depression   . Diabetes mellitus    type II  DIET CONTROLLED  . Diarrhea   . Hepatitis    AGE 32S  . Hyperlipidemia   . Hypertension   . Mild aortic stenosis    a. 06/2013 Echo: EF 65-70%, mod LVH with focal basal hypertrophy, very mild AS.  Marland Kitchen Osteoarthritis   . Oxygen desaturation during sleep    USES 2 LITERS BEDTIME   [facility]AP [facility]   . Shortness of breath    WITH EXERTION USES 2 L O2 BEDTIME  . Sleep apnea    CPAP WITH O2 2 LITERS 2013 ([facility])  . Wears dentures   . Wears glasses     Past Surgical History:  Procedure Laterality Date  . ABDOMINAL HYSTERECTOMY    . APPENDECTOMY    . CHOLECYSTECTOMY    . JOINT REPLACEMENT     LEFT KNEE   . KNEE ARTHROPLASTY Right 03/22/2015   Procedure: COMPUTER ASSISTED TOTAL KNEE ARTHROPLASTY;  Surgeon: Marybelle Killings, MD;  Location: [place];  Service: Orthopedics;  Laterality: Right;  . KNEE ARTHROSCOPY     RIGHT  . LEFT AND RIGHT HEART CATHETERIZATION WITH CORONARY ANGIOGRAM N/A 10/28/2013    Procedure: LEFT AND RIGHT HEART CATHETERIZATION WITH CORONARY ANGIOGRAM;  Surgeon: Blane Ohara, MD;  Location: [facility] CATH LAB;  Service: Cardiovascular;  Laterality: N/A;  . LEFT HEART CATHETERIZATION WITH CORONARY ANGIOGRAM N/A 11/14/2014   Procedure: LEFT HEART CATHETERIZATION WITH CORONARY ANGIOGRAM;  Surgeon: Josue Hector, MD;  Location: [facility] CATH LAB;  Service: Cardiovascular;  Laterality: N/A;  . REVISION TOTAL KNEE ARTHROPLASTY  2011  . ROTATOR CUFF REPAIR  2010  . SHOULDER ARTHROSCOPY  09/17/2012   Procedure: ARTHROSCOPY SHOULDER;  Surgeon: Sharmon Revere, MD;  Location: [place];  Service: Orthopedics;  Laterality: Left;  . TONSILLECTOMY    . TONSILLECTOMY      There were no vitals filed for this visit.   Subjective Assessment - 10/13/20 1054    Subjective had an episode over weekend where she thought her Lt knee was going to buckle, symptoms now resolved.  no pain in hips today    Limitations Standing;Walking    Patient Stated Goals be able to learn how to use equipment safely to go to the gym    Currently  in Pain? No/denies                             [facility] Adult PT Treatment/Exercise - 10/13/20 1103      Knee/Hip Exercises: Aerobic   Nustep L5 x 8 min      Knee/Hip Exercises: Seated   Long Arc Quad Both;20 reps;Weights    Long Arc Quad Weight 3 lbs.    Marching Both;20 reps;Weights    Marching Weights 3 lbs.    Sit to Sand 5 reps;without UE support                       PT Long Term Goals - 10/13/20 1133      PT LONG TERM GOAL #1   Title Pt will be independent in her HEP and progression for bilateral LE's.    Status On-going    Target Date 11/17/20      PT LONG TERM GOAL #2   Title Pt will be able to perform 5 time sit to stand in less than 15 seconds with UE support as needed.    Baseline 25 seconds with UE support    Time 6    Period Weeks    Status On-going      PT LONG TERM GOAL #3   Title Pt will be able to stand 20  minutes with pain </= 2/ 10 in order to prepare meals.    Time 6    Period Weeks    Status On-going      PT LONG TERM GOAL #4   Title Pt will improve her bilateral LE's to grossly 4+/5 in order to improve functional mobility and gait.    Time 6    Period Weeks    Status On-going                 Plan - 10/13/20 1133    Clinical Impression Statement Pt tolerated session well today with improved BP noted today.  She requested to end early due to fatigue from having company yesterday.  Will continue to beneift from PT to maximize function.    Personal Factors and Comorbidities Comorbidity 3+    Comorbidities HTN, OSA, CHF, anxiety, OA, osteopenia, aortic stenosis, depression, seizure    Examination-Participation Restrictions Community Activity    Stability/Clinical Decision Making Evolving/Moderate complexity    Rehab Potential Good    PT Frequency 2x / week    PT Duration 6 weeks    PT Treatment/Interventions ADLs/Self Care Home Management;Cryotherapy;Moist Heat;Gait training;Stair training;Functional mobility training;Therapeutic activities;Therapeutic exercise;Neuromuscular re-education;Patient/family education;Manual techniques;Taping;Balance training;Passive range of motion    PT Next Visit Plan continue to add to pt's HEP for LE strengthening, try leg press    PT Home Exercise Plan Access Code: [email or web address]    Consulted and Agree with Plan of Care Patient           Patient will benefit from skilled therapeutic intervention in order to improve the following deficits and impairments:  Decreased strength, Difficulty walking, Decreased endurance, Pain, Decreased balance, Decreased mobility  Visit Diagnosis: Pain in left hip  Pain in right hip  Muscle weakness (generalized)     Problem List Patient Active Problem List   Diagnosis Date Noted  . Encounter for palliative care involving management of pain 09/20/2020  . Pneumonia of right lower lobe due to infectious  organism 09/20/2020  . Hypertensive urgency 08/11/2020  . Excessive somnolence disorder 04/12/2020  .  Routine general medical examination at a health care facility 04/10/2020  . Thrombocytopenia ([place]) 04/10/2020  . Anxiety 08/26/2018  . Seizure ([place]) 08/25/2018  . GERD (gastroesophageal reflux disease) 08/25/2018  . Chronic diastolic CHF (congestive heart failure) ([place]) 08/25/2018  . Major depressive disorder, recurrent episode with anxious distress ([place]) 02/04/2017  . Dementia arising in the senium and presenium ([place]) 06/27/2016  . Obesity, Class II, BMI 35-39.9, with comorbidity 05/15/2016  . Insomnia w/ sleep apnea 10/16/2015  . IBS (irritable bowel syndrome) 02/27/2015  . Hashimoto's thyroiditis 05/05/2014  . Adrenal incidentaloma ([place]) 11/04/2013  . Benign paroxysmal positional vertigo 07/15/2013  . Aortic stenosis, mild 07/14/2013  . OAB (overactive bladder) 02/24/2013  . GERD with stricture 12/24/2012  . Sleep-related hypoventilation 07/30/2012  . OSA (obstructive sleep apnea) 04/16/2012  . Osteopenia 04/16/2012  . Preventative health care 05/24/2011  . Vitamin D deficiency 12/04/2010  . Hyperlipidemia with target LDL less than 130 12/03/2010  . GAD (generalized anxiety disorder) 12/03/2010  . Essential hypertension 12/03/2010  . Osteoarthritis 12/03/2010      Laureen Abrahams, PT, DPT 10/13/20 11:35 AM    [facility] Therapy [address] [place], [place], [address] Phone: [phone]   Fax:  [phone]  Name: [name] MRN: [id number] Date of Birth: January 21, 1933

## 2020-10-16 ENCOUNTER — Telehealth: Payer: Self-pay | Admitting: Internal Medicine

## 2020-10-16 NOTE — Telephone Encounter (Signed)
Patient is requesting a med refill for HYDROcodone-acetaminophen (NORCO/VICODIN) 5-325 MG tablet  She was also wondering if she could get the bigger box of albuterol (PROVENTIL) (2.5 MG/3ML) 0.083% nebulizer solution  They can be sent to [facility] [place] [facility] [place]

## 2020-10-17 ENCOUNTER — Other Ambulatory Visit: Payer: Self-pay | Admitting: Internal Medicine

## 2020-10-17 DIAGNOSIS — M8949 Other hypertrophic osteoarthropathy, multiple sites: Secondary | ICD-10-CM

## 2020-10-17 DIAGNOSIS — Z515 Encounter for palliative care: Secondary | ICD-10-CM

## 2020-10-17 DIAGNOSIS — M159 Polyosteoarthritis, unspecified: Secondary | ICD-10-CM

## 2020-10-17 DIAGNOSIS — J4520 Mild intermittent asthma, uncomplicated: Secondary | ICD-10-CM

## 2020-10-17 MED ORDER — HYDROCODONE-ACETAMINOPHEN 5-325 MG PO TABS
1.0000 | ORAL_TABLET | Freq: Four times a day (QID) | ORAL | 0 refills | Status: DC | PRN
Start: 2020-10-17 — End: 2020-11-21

## 2020-10-17 MED ORDER — ALBUTEROL SULFATE (2.5 MG/3ML) 0.083% IN NEBU: INHALATION_SOLUTION | RESPIRATORY_TRACT | 5 refills | Status: DC

## 2020-10-25 ENCOUNTER — Other Ambulatory Visit: Payer: Self-pay | Admitting: Internal Medicine

## 2020-10-25 DIAGNOSIS — F411 Generalized anxiety disorder: Secondary | ICD-10-CM

## 2020-10-27 DIAGNOSIS — G4733 Obstructive sleep apnea (adult) (pediatric): Secondary | ICD-10-CM | POA: Diagnosis not present

## 2020-10-30 ENCOUNTER — Ambulatory Visit: Payer: Medicare HMO | Admitting: Rehabilitative and Restorative Service Providers"

## 2020-10-30 ENCOUNTER — Other Ambulatory Visit: Payer: Self-pay

## 2020-10-30 ENCOUNTER — Encounter: Payer: Self-pay | Admitting: Rehabilitative and Restorative Service Providers"

## 2020-10-30 DIAGNOSIS — M6281 Muscle weakness (generalized): Secondary | ICD-10-CM

## 2020-10-30 DIAGNOSIS — M25551 Pain in right hip: Secondary | ICD-10-CM | POA: Diagnosis not present

## 2020-10-30 DIAGNOSIS — M25552 Pain in left hip: Secondary | ICD-10-CM

## 2020-10-30 NOTE — Therapy (Addendum)
[facility] [place], [place], [address] Phone: [phone]   Fax:  [phone]  Physical Therapy Treatment/Discharge  Patient Details  Name: [name] MRN: [id number] Date of Birth: 1933-10-18 Referring Provider (PT): Gregor Hams, MD   Encounter Date: 10/30/2020   PT End of Session - 10/30/20 1309    Visit Number 6    Number of Visits 12    Date for PT Re-Evaluation 11/03/20    PT Start Time 1300    PT Stop Time 1339    PT Time Calculation (min) 39 min    Activity Tolerance Patient limited by fatigue    Behavior During Therapy [facility] for tasks assessed/performed           Past Medical History:  Diagnosis Date  . Abdominal pain   . Anemia    NOS  . Anxiety   . Back pain   . Bruises easily   . Chronic diastolic CHF (congestive heart failure) ([place])    a. 06/2013 EF 65-70%.  . Chronic headaches    HISTORY OF   . Depression   . Diabetes mellitus    type II  DIET CONTROLLED  . Diarrhea   . Hepatitis    AGE 32S  . Hyperlipidemia   . Hypertension   . Mild aortic stenosis    a. 06/2013 Echo: EF 65-70%, mod LVH with focal basal hypertrophy, very mild AS.  Marland Kitchen Osteoarthritis   . Oxygen desaturation during sleep    USES 2 LITERS BEDTIME   [facility]AP [facility]   . Shortness of breath    WITH EXERTION USES 2 L O2 BEDTIME  . Sleep apnea    CPAP WITH O2 2 LITERS 2013 ([facility])  . Wears dentures   . Wears glasses     Past Surgical History:  Procedure Laterality Date  . ABDOMINAL HYSTERECTOMY    . APPENDECTOMY    . CHOLECYSTECTOMY    . JOINT REPLACEMENT     LEFT KNEE   . KNEE ARTHROPLASTY Right 03/22/2015   Procedure: COMPUTER ASSISTED TOTAL KNEE ARTHROPLASTY;  Surgeon: Marybelle Killings, MD;  Location: [place];  Service: Orthopedics;  Laterality: Right;  . KNEE ARTHROSCOPY     RIGHT  . LEFT AND RIGHT HEART CATHETERIZATION WITH CORONARY ANGIOGRAM N/A 10/28/2013   Procedure: LEFT AND RIGHT HEART CATHETERIZATION WITH  CORONARY ANGIOGRAM;  Surgeon: Blane Ohara, MD;  Location: [facility] CATH LAB;  Service: Cardiovascular;  Laterality: N/A;  . LEFT HEART CATHETERIZATION WITH CORONARY ANGIOGRAM N/A 11/14/2014   Procedure: LEFT HEART CATHETERIZATION WITH CORONARY ANGIOGRAM;  Surgeon: Josue Hector, MD;  Location: [facility] CATH LAB;  Service: Cardiovascular;  Laterality: N/A;  . REVISION TOTAL KNEE ARTHROPLASTY  2011  . ROTATOR CUFF REPAIR  2010  . SHOULDER ARTHROSCOPY  09/17/2012   Procedure: ARTHROSCOPY SHOULDER;  Surgeon: Sharmon Revere, MD;  Location: [place];  Service: Orthopedics;  Laterality: Left;  . TONSILLECTOMY    . TONSILLECTOMY      There were no vitals filed for this visit.   Subjective Assessment - 10/30/20 1308    Subjective Pt. indicated no pain today upon arrival.  Pt. stated having a few days of having back pain but it came insidious and went away insidiously.  Reported her son is in hospital and she may want to leave early to go see him    Limitations Standing;Walking    Patient Stated Goals be able to learn how to use  equipment safely to go to the gym    Currently in Pain? No/denies                             [facility] Adult PT Treatment/Exercise - 10/30/20 0001      Knee/Hip Exercises: Aerobic   Nustep Lvl 5 10 mins      Knee/Hip Exercises: Seated   Long Arc Quad Both;3 sets;10 reps    Long Arc Quad Weight 3 lbs.    Marching 3 sets;10 reps;Both    Marching Weights 3 lbs.    Sit to [employer] 10 reps                       PT Long Term Goals - 10/13/20 1133      PT LONG TERM GOAL #1   Title Pt will be independent in her HEP and progression for bilateral LE's.    Status On-going    Target Date 11/17/20      PT LONG TERM GOAL #2   Title Pt will be able to perform 5 time sit to stand in less than 15 seconds with UE support as needed.    Baseline 25 seconds with UE support    Time 6    Period Weeks    Status On-going      PT LONG TERM GOAL #3   Title Pt  will be able to stand 20 minutes with pain </= 2/ 10 in order to prepare meals.    Time 6    Period Weeks    Status On-going      PT LONG TERM GOAL #4   Title Pt will improve her bilateral LE's to grossly 4+/5 in order to improve functional mobility and gait.    Time 6    Period Weeks    Status On-going                 Plan - 10/30/20 1309    Clinical Impression Statement Education given during treatment about importance of progressive resistance/aerobic intervention for improved function and activity tolerance (Pt. in agreement).  Additional time required during performance of intervention due to slower rep speed and periodic breaks.    Personal Factors and Comorbidities Comorbidity 3+    Comorbidities HTN, OSA, CHF, anxiety, OA, osteopenia, aortic stenosis, depression, seizure    Examination-Participation Restrictions Community Activity    Stability/Clinical Decision Making Evolving/Moderate complexity    Rehab Potential Good    PT Frequency 2x / week    PT Duration 6 weeks    PT Treatment/Interventions ADLs/Self Care Home Management;Cryotherapy;Moist Heat;Gait training;Stair training;Functional mobility training;Therapeutic activities;Therapeutic exercise;Neuromuscular re-education;Patient/family education;Manual techniques;Taping;Balance training;Passive range of motion    PT Next Visit Plan Recertication next visit    PT Home Exercise Plan Access Code: [email or web address]    Consulted and Agree with Plan of Care Patient           Patient will benefit from skilled therapeutic intervention in order to improve the following deficits and impairments:  Decreased strength, Difficulty walking, Decreased endurance, Pain, Decreased balance, Decreased mobility  Visit Diagnosis: Pain in left hip  Pain in right hip  Muscle weakness (generalized)     Problem List Patient Active Problem List   Diagnosis Date Noted  . Encounter for palliative care involving management of pain  09/20/2020  . Pneumonia of right lower lobe due to infectious organism 09/20/2020  . Hypertensive urgency 08/11/2020  .  Excessive somnolence disorder 04/12/2020  . Routine general medical examination at a health care facility 04/10/2020  . Thrombocytopenia ([place]) 04/10/2020  . Anxiety 08/26/2018  . Seizure ([place]) 08/25/2018  . GERD (gastroesophageal reflux disease) 08/25/2018  . Chronic diastolic CHF (congestive heart failure) ([place]) 08/25/2018  . Major depressive disorder, recurrent episode with anxious distress ([place]) 02/04/2017  . Dementia arising in the senium and presenium ([place]) 06/27/2016  . Obesity, Class II, BMI 35-39.9, with comorbidity 05/15/2016  . Insomnia w/ sleep apnea 10/16/2015  . IBS (irritable bowel syndrome) 02/27/2015  . Hashimoto's thyroiditis 05/05/2014  . Adrenal incidentaloma ([place]) 11/04/2013  . Benign paroxysmal positional vertigo 07/15/2013  . Aortic stenosis, mild 07/14/2013  . OAB (overactive bladder) 02/24/2013  . GERD with stricture 12/24/2012  . Sleep-related hypoventilation 07/30/2012  . OSA (obstructive sleep apnea) 04/16/2012  . Osteopenia 04/16/2012  . Preventative health care 05/24/2011  . Vitamin D deficiency 12/04/2010  . Hyperlipidemia with target LDL less than 130 12/03/2010  . GAD (generalized anxiety disorder) 12/03/2010  . Essential hypertension 12/03/2010  . Osteoarthritis 12/03/2010    Scot Jun, PT, DPT, OCS, ATC 10/30/20  1:32 PM  PHYSICAL THERAPY DISCHARGE SUMMARY  Visits from Start of Care: 6  Current functional level related to goals / functional outcomes: See note   Remaining deficits: See note   Education / Equipment: HEP Plan: Patient agrees to discharge.  Patient goals were partially met. Patient is being discharged due to not returning since the last visit.  ?????     Scot Jun, PT, DPT, OCS, ATC 01/09/21  9:05 AM     [facility] [address] [place], [place],  [address] Phone: [phone]   Fax:  [phone]  Name: [name] MRN: [id number] Date of Birth: Apr 08, 1933

## 2020-11-01 ENCOUNTER — Other Ambulatory Visit: Payer: Self-pay

## 2020-11-01 ENCOUNTER — Encounter: Payer: Medicare HMO | Admitting: Physical Therapy

## 2020-11-01 ENCOUNTER — Telehealth: Payer: Self-pay | Admitting: Physical Therapy

## 2020-11-01 NOTE — Telephone Encounter (Signed)
I called to follow up why pt missed her 11:00 appointment today. Pt stated her stomach was upset and she couldn't make it. Pt was instructed she needed to call when she is feeling better to schedule further physical therapy appointments.   Kearney Hard, PT, MPT 11/01/20 11:39 AM

## 2020-11-01 NOTE — Patient Outreach (Signed)
[facility]) Care Management  11/01/2020  [name] 1933-01-08 [id number]   Telephone Assessment    Unsuccessful outreach attempt to patient and unable to leave message.    Plan: RN CM will make outreach attempt to patient within the month of Feb if no return call from patient.   [name], RN,BSN,CCM [facility] Telephonic Care Management Coordinator Direct Phone: [phone] Toll Free: [phone] Fax: [phone]

## 2020-11-04 DIAGNOSIS — G4733 Obstructive sleep apnea (adult) (pediatric): Secondary | ICD-10-CM | POA: Diagnosis not present

## 2020-11-06 ENCOUNTER — Other Ambulatory Visit: Payer: Self-pay | Admitting: Internal Medicine

## 2020-11-07 ENCOUNTER — Ambulatory Visit: Payer: Self-pay

## 2020-11-21 ENCOUNTER — Other Ambulatory Visit: Payer: Self-pay | Admitting: Internal Medicine

## 2020-11-21 ENCOUNTER — Telehealth: Payer: Self-pay | Admitting: Internal Medicine

## 2020-11-21 DIAGNOSIS — M159 Polyosteoarthritis, unspecified: Secondary | ICD-10-CM

## 2020-11-21 DIAGNOSIS — I5032 Chronic diastolic (congestive) heart failure: Secondary | ICD-10-CM

## 2020-11-21 DIAGNOSIS — Z515 Encounter for palliative care: Secondary | ICD-10-CM

## 2020-11-21 DIAGNOSIS — I10 Essential (primary) hypertension: Secondary | ICD-10-CM

## 2020-11-21 MED ORDER — HYDROCODONE-ACETAMINOPHEN 5-325 MG PO TABS
1.0000 | ORAL_TABLET | Freq: Four times a day (QID) | ORAL | 0 refills | Status: DC | PRN
Start: 2020-11-21 — End: 2020-12-21

## 2020-11-21 NOTE — Telephone Encounter (Signed)
1.Medication Requested: HYDROcodone-acetaminophen (NORCO/VICODIN) 5-325 MG tablet   2. Pharmacy (Name, Street, [place]): [facility] [phone] - [place], [place] - [address]  3. On Med List: no   4. Last Visit with PCP: 10.26.2021  5. Next visit date with PCP: 4.26.22   Agent: Please be advised that RX refills may take up to 3 business days. We ask that you follow-up with your pharmacy.

## 2020-11-26 ENCOUNTER — Encounter: Payer: Self-pay | Admitting: Cardiology

## 2020-11-27 ENCOUNTER — Telehealth: Payer: Self-pay | Admitting: Pharmacist

## 2020-11-27 NOTE — Progress Notes (Addendum)
Chronic Care Management Pharmacy Assistant   Name: CORONDA ERCOLANI  MRN: [id number] DOB: Nov 02, 1933  Reason for Encounter: General Adherence Call   PCP : Etta Grandchild, MD  Allergies:   Allergies  Allergen Reactions   Amlodipine Other (See Comments)    Reaction:  Dizziness    Enalapril Cough   Losartan Potassium Other (See Comments)    Reaction:  GI pain    Morphine And Related Itching and Nausea And Vomiting   Metformin Diarrhea    Medications: Outpatient Encounter Medications as of 11/27/2020  Medication Sig   albuterol (PROVENTIL) (2.5 MG/3ML) 0.083% nebulizer solution INHALE 3 MLS(2.5 MG TOTAL) BY NEBULIZATION TWICE DAILY AS NEEDED FOR WHEEZING OR SHORTNESS OF BREATH   carvedilol (COREG) 12.5 MG tablet TAKE 1 TABLET(12.5 MG) BY MOUTH TWICE DAILY WITH A MEAL (Patient taking differently: Take 12.5 mg by mouth 2 (two) times daily with a meal. )   Cholecalciferol 2000 units TABS Take 2 tablets (4,000 Units total) by mouth daily.   clonazePAM (KLONOPIN) 1 MG tablet Take 1 tablet (1 mg total) by mouth 2 (two) times daily as needed for anxiety.   hydrALAZINE (APRESOLINE) 50 MG tablet Take 1 tablet (50 mg total) by mouth 2 (two) times daily.   HYDROcodone-acetaminophen (NORCO/VICODIN) 5-325 MG tablet Take 1 tablet by mouth every 6 (six) hours as needed.   MYRBETRIQ 50 MG TB24 tablet TAKE 1 TABLET(50 MG) BY MOUTH DAILY   omeprazole (PRILOSEC) 40 MG capsule TAKE 1 CAPSULE(40 MG) BY MOUTH DAILY (Patient taking differently: Take 40 mg by mouth daily. )   spironolactone (ALDACTONE) 25 MG tablet Take 0.5 tablets (12.5 mg total) by mouth daily.   torsemide (DEMADEX) 20 MG tablet TAKE 1 TABLET(20 MG) BY MOUTH DAILY   traZODone (DESYREL) 150 MG tablet TAKE 1 TABLET(150 MG) BY MOUTH AT BEDTIME   No facility-administered encounter medications on file as of 11/27/2020.    Current Diagnosis: Patient Active Problem List   Diagnosis Date Noted   Encounter for palliative care involving  management of pain 09/20/2020   Pneumonia of right lower lobe due to infectious organism 09/20/2020   Hypertensive urgency 08/11/2020   Excessive somnolence disorder 04/12/2020   Routine general medical examination at a health care facility 04/10/2020   Thrombocytopenia (HCC) 04/10/2020   Anxiety 08/26/2018   Seizure (HCC) 08/25/2018   GERD (gastroesophageal reflux disease) 08/25/2018   Chronic diastolic CHF (congestive heart failure) (HCC) 08/25/2018   Major depressive disorder, recurrent episode with anxious distress (HCC) 02/04/2017   Dementia arising in the senium and presenium (HCC) 06/27/2016   Obesity, Class II, BMI 35-39.9, with comorbidity 05/15/2016   Insomnia w/ sleep apnea 10/16/2015   IBS (irritable bowel syndrome) 02/27/2015   Hashimoto's thyroiditis 05/05/2014   Adrenal incidentaloma (HCC) 11/04/2013   Benign paroxysmal positional vertigo 07/15/2013   Aortic stenosis, mild 07/14/2013   OAB (overactive bladder) 02/24/2013   GERD with stricture 12/24/2012   Sleep-related hypoventilation 07/30/2012   OSA (obstructive sleep apnea) 04/16/2012   Osteopenia 04/16/2012   Preventative health care 05/24/2011   Vitamin D deficiency 12/04/2010   Hyperlipidemia with target LDL less than 130 12/03/2010   GAD (generalized anxiety disorder) 12/03/2010   Essential hypertension 12/03/2010   Osteoarthritis 12/03/2010    Goals Addressed   None     Follow-Up:  Pharmacist Review   A general Adherence wellness call was made to Ms. Knight to see how she has been doing since her last visit with the clinical  pharmacist Mardella Layman. The patient stated that she has been doing well except that she is still having a lot of pain in her legs and back. She did stated that Dr. Yetta Barre gave her hydrocodone to help with the pain. She said that in order for her to get around she has to walk with a walker.   The patient also stated that she checks her blood pressure at least twice a day and lately it has  been elevated. The patient states that she went to the hospital back in September because her blood pressure was so elevated. She did say that one time when she had taken her blood pressure it was 200/199 so she called a friend and that her friend stayed with her until it came down to 169/69. She said at the time she was very dizzy. The patient stated that today when she had taken her blood pressure it was 145/70. She states that the medication she is taking for her blood pressure makes her feel "some kind of way" and makes it hard for her to function. She believes that she is taking to much medication, she has a hard time trying to concentrate and focus.   Another issue she complained about was her Albuterol. She states that she gets a box of a 12 day supply but would like to know if they could order her the bigger box instead. The patient states that she uses it twice a day, once in the ap and once in the pm. I told the patient that I would pass along the information to [place].   Horald Pollen, Clinical Pharmacist Assistant [facility]

## 2020-11-30 ENCOUNTER — Telehealth (INDEPENDENT_AMBULATORY_CARE_PROVIDER_SITE_OTHER): Payer: Medicare HMO | Admitting: Internal Medicine

## 2020-11-30 ENCOUNTER — Other Ambulatory Visit: Payer: Self-pay

## 2020-11-30 ENCOUNTER — Encounter: Payer: Self-pay | Admitting: Internal Medicine

## 2020-11-30 DIAGNOSIS — R1033 Periumbilical pain: Secondary | ICD-10-CM

## 2020-11-30 MED ORDER — AMOXICILLIN-POT CLAVULANATE 875-125 MG PO TABS: 1.0000 | ORAL_TABLET | Freq: Two times a day (BID) | ORAL | 0 refills | Status: DC

## 2020-11-30 MED ORDER — ONDANSETRON HCL 4 MG PO TABS: 4.0000 mg | ORAL_TABLET | Freq: Three times a day (TID) | ORAL | 0 refills | Status: DC | PRN

## 2020-11-30 NOTE — Progress Notes (Signed)
Virtual Visit via Video Note  I connected with [name] on 11/30/20 at 10:20 AM EST by a video enabled telemedicine application and verified that I am speaking with the correct person using two identifiers.  The patient and the provider were at separate locations throughout the entire encounter. Patient location: home, Provider location: work   I discussed the limitations of evaluation and management by telemedicine and the availability of in person appointments. The patient expressed understanding and agreed to proceed. The patient and the provider were the only parties present for the visit unless noted in HPI below.  History of Present Illness: The patient is a 87 y.o. female with visit for diarrhea and nausea. Started Monday with pain in the stomach. She had taken a new otc supplement and thought that may have caused it. She stopped taking her hydrocodone as she thought that was the problem also. Pain is around the belly button. Since that time she is having diarrhea with BM most of the time when she goes to the bathroom. Has been throwing up some. Has been able to keep some liquids down. Not eating much lately. Some soup yesterday. Denies fevers or chills. She has stopped taking vicodin since Monday as she thought it could be the cause of the stomach pain. Overall it is not improving. No known sick contacts. Does not leave home recently. Denies blood in diarrhea.  Observations/Objective: Appearance: normal, breathing appears normal, casual grooming, abdomen does not appear distended pain to self palpation around the midline, throat not well visualized,  Assessment and Plan: See problem oriented charting  Follow Up Instructions: rx augmentin to cover for diverticulitis, suspect opioid withdrawal and advised to resume her chronic hydrocodone TID prn  I discussed the assessment and treatment plan with the patient. The patient was provided an opportunity to ask questions and all were answered.  The patient agreed with the plan and demonstrated an understanding of the instructions.   The patient was advised to call back or seek an in-person evaluation if the symptoms worsen or if the condition fails to improve as anticipated.  Myrlene Broker, MD

## 2020-11-30 NOTE — Assessment & Plan Note (Signed)
Could be related to opioid withdrawal and she is advised to resume her hydrocodone. Rx zofran for nausea. Okay to use imodium for diarrhea. Rx augmentin to cover for diverticulitis. Call and update office on status in a few days. Sooner to ER for uncontrolled pain or unable to keep down liquids.

## 2020-12-04 ENCOUNTER — Other Ambulatory Visit: Payer: Self-pay | Admitting: Cardiology

## 2020-12-04 ENCOUNTER — Telehealth: Payer: Self-pay | Admitting: Cardiology

## 2020-12-04 MED ORDER — HYDRALAZINE HCL 50 MG PO TABS
50.0000 mg | ORAL_TABLET | Freq: Two times a day (BID) | ORAL | 1 refills | Status: DC
Start: 2020-12-04 — End: 2021-02-19

## 2020-12-04 NOTE — Telephone Encounter (Signed)
Pt c/o medication issue:  1. Name of Medication: hydrALAZINE (APRESOLINE) 50 MG tablet    2. How are you currently taking this medication (dosage and times per day)? As prescribed.  3. Are you having a reaction (difficulty breathing--STAT)? No.  4. What is your medication issue? Patient states that this medication was prescribed to her when she was admitted to the hospital 08/11/2020 and now she is currently out of this medication and would like to know if she should get it refilled. Please advise.

## 2020-12-04 NOTE — Telephone Encounter (Signed)
Called patient and spoke to her and daughter- she was just needing refill of medication sent to pharmacy.  Sent RX, as previous note from November 2021 states to continue meds.

## 2020-12-05 DIAGNOSIS — G4733 Obstructive sleep apnea (adult) (pediatric): Secondary | ICD-10-CM | POA: Diagnosis not present

## 2020-12-06 NOTE — Telephone Encounter (Addendum)
Contacted patient to discuss various medication issues noted by pharmacist assistant, below.  Patient has seen Dr Sharlet Salina for stomach upset and was prescribed Augmentin and Zofran. Pt reports she is feeling better.  Pt is worried hydrocodone is not safe for her to take. Discussed benefits/risks of hydrocodone at length. Advised her to use lowest amount possible to control pain.  Pt was able to get hydralazine refilled by calling her cardiologist. BP has improved since she restarted the medication.  Patient is appreciative of advice, denies further questions.  20 minutes spent in discussion with patient, review, coordination, and documentation.  Charlton Haws, [facility] 12/06/20 4:10 PM

## 2020-12-08 ENCOUNTER — Telehealth: Payer: Self-pay | Admitting: Internal Medicine

## 2020-12-08 NOTE — Telephone Encounter (Signed)
Patient daughter Caren Griffins called back to clarify information given to patient Call transferred to [facility]

## 2020-12-08 NOTE — Telephone Encounter (Signed)
[facility] FYI  Caller states she spoke to PCP 11/30/2020. Pt has been having diarrhea, difficult eating due to the diarrhea and stomach upset. Pt states she was given some medication that helped with s/s. Pt is now starting to get the pain back around her naval area,feeling to have a bm but only passes gas. Last regular bm was 2-3 days. She feels if she was to have a bm she would feel better.  [facility] advised: Go to ED now  Patient understood but wanted to speak with PCP instead.

## 2020-12-08 NOTE — Telephone Encounter (Signed)
Patient called and said that amoxicillin-clavulanate (AUGMENTIN) 875-125 MG tablet has stopped the diarrhea that she was having and she is now not have a BM at all. She said she does have some gas and feels like she needs to go. Her last BM was 12/01/20. Transferred to [facility].

## 2020-12-12 DIAGNOSIS — Z1152 Encounter for screening for COVID-19: Secondary | ICD-10-CM | POA: Diagnosis not present

## 2020-12-15 ENCOUNTER — Telehealth: Payer: Self-pay | Admitting: Cardiology

## 2020-12-15 NOTE — Telephone Encounter (Signed)
Returned the call to the patient and daughter. The patient stated that she was calling to make an appointment with Dr. Harrell Gave. The patient stated that her shortness of breath was not getting any better but has not gotten worse. She was not audibly short of breath on the phone.   Her current weight is 209.7 pounds. At her last visit it was 218.  She is currently taking torsemide 20 mg once daily and spironolactone 12.5 mg once daily. She has an appointment on 2/25 with Dr. Harrell Gave. She was offered an earlier appointment and refused. She has been advised to call back if the shortness of breath gets worse.

## 2020-12-15 NOTE — Telephone Encounter (Signed)
    Pt c/o Shortness Of Breath: STAT if SOB developed within the last 24 hours or pt is noticeably SOB on the phone  1. Are you currently SOB (can you hear that pt is SOB on the phone)? no  2. How long have you been experiencing SOB? Sometimes in 2 weeks   3. Are you SOB when sitting or when up moving around? Both   4. Are you currently experiencing any other symptoms? No other symptoms  Pt and her daughter called, pt sob is getting worst and would like for Dr. Harrell Gave to be aware

## 2020-12-19 ENCOUNTER — Telehealth: Payer: Self-pay | Admitting: Internal Medicine

## 2020-12-19 NOTE — Telephone Encounter (Signed)
1.Medication Requested: HYDROcodone-acetaminophen (NORCO/VICODIN) 5-325 MG tablet    2. Pharmacy (Name, Street, City): [id number] - [place], [place] - [address]  3. On Med List: yes   4. Last Visit with PCP: 09-19-20  5. Next visit date with PCP: 03-20-21   Agent: Please be advised that RX refills may take up to 3 business days. We ask that you follow-up with your pharmacy.  

## 2020-12-21 ENCOUNTER — Other Ambulatory Visit: Payer: Self-pay | Admitting: Internal Medicine

## 2020-12-21 DIAGNOSIS — M8949 Other hypertrophic osteoarthropathy, multiple sites: Secondary | ICD-10-CM

## 2020-12-21 DIAGNOSIS — M159 Polyosteoarthritis, unspecified: Secondary | ICD-10-CM

## 2020-12-21 DIAGNOSIS — Z515 Encounter for palliative care: Secondary | ICD-10-CM

## 2020-12-21 MED ORDER — HYDROCODONE-ACETAMINOPHEN 5-325 MG PO TABS: 1.0000 | ORAL_TABLET | Freq: Four times a day (QID) | ORAL | 0 refills | Status: DC | PRN

## 2020-12-26 ENCOUNTER — Other Ambulatory Visit: Payer: Self-pay

## 2020-12-26 NOTE — Patient Outreach (Signed)
[facility]) Care Management  12/26/2020  SERINITY WARE [birth year] [id number]   Telephone Assessment Quarterly Call  Successful outreach to patient. Spoke with patient who denies any acute issues at present. She voices that she has been doing fairly well. She continues to reside in her home alone. She has supportive daughter and other family that checks on her daily. No recent falls. She remains independent with ADLs/IADLs. Appetite remains fair. Patient reports she has lost a few pounds-wgt down to 209 lbs from 218. Se admits that she has not been weighing daily and encouraged to do so. No edema noted/reported. She states she had been having some abdominal issues but it had not bothered her lately. LBM was normal and on yesterday per patient report. She reports no SOB at present.    Goals Addressed              This Visit's Progress   .  (THN)Make and Keep All Appointments (pt-stated)        Timeframe:  Long-Range Goal Priority:  High Start Date:  12/26/2020                           Expected End Date:   May 2022                    Follow Up Date May 2022 -Make an appt to get COVID-19 booster shot   Why is this important?    Part of staying healthy is seeing the doctor for follow-up care.   If you forget your appointments, there are some things you can do to stay on track.    Notes:  01/15/21-Patient past due for booster shot. RN CM educated pt on importance of getting vaccinated and provided info on how to obtain one. Patient states she only wanted to go to a [facility] vaccine clinic and not use local pharmacies.      .  (THN)Track and Manage Fluids and Swelling        Timeframe:  Long-Range Goal Priority:  High Start Date: 09/04/20                       Expected End Date:  May 2022  Follow Up Date May 2022   - call office if I gain more than 2 pounds in one day or 5 pounds in one week - keep legs up while sitting - use salt in moderation - watch for swelling  in feet, ankles and legs every day - weigh myself daily    Why is this important?   It is important to check your weight daily and watch how much salt and liquids you have.  It will help you to manage your heart failure.    Notes:  12/27/20-Wgt stable. Patient has lost about 9 lbs. She reports adhering to diuretic regimen. She denies an edema or SOB during this call.    .  (THN)Track and Manage My Blood Pressure        Timeframe:  Long-Range Goal Priority:  High Start Date:  09/04/20                           Expected End Date:  May 2022                    Follow Up Date   May 2022 - check blood  pressure daily - write blood pressure results in a log or diary    Why is this important?   You won't feel high blood pressure, but it can still hurt your blood vessels.  High blood pressure can cause heart or kidney problems. It can also cause a stroke.  Making lifestyle changes like losing a little weight or eating less salt will help.  Checking your blood pressure at home and at different times of the day can help to control blood pressure.  If the doctor prescribes medicine remember to take it the way the doctor ordered.  Call the office if you cannot afford the medicine or if there are questions about it.     Notes: 12/26/20-RN CM did not get to address this call-patient had to end call early    .  (THN)Track and Manage Symptoms        Timeframe:  Long-Range Goal Priority:  High Start Date:   09/04/20                          Expected End Date:   04/23/2021                   Follow Up Date May 2022   - develop a rescue plan - eat more whole grains, fruits and vegetables, lean meats and healthy fats - follow rescue plan if symptoms flare-up - know when to call the doctor - track symptoms and what helps feel better or worse    Why is this important?   You will be able to handle your symptoms better if you keep track of them.  Making some simple changes to your lifestyle will help.   Eating healthy is one thing you can do to take good care of yourself.    Notes:  12/26/20-Patient reports that he forgets or does not allows feel like monitoring wgt. Reinforced to patient importance of wgt monitoring and proper technique as well as when to seek medical attention. Wgt stable at 209 lbs.        Plan: RN CM discussed with patient next outreach within the month of May. Patient gave verbal consent and in agreement with RN CM follow up and timeframe. Patient aware that they may contact RN CM sooner for any issues or concerns. RN CM reviewed goals and plan of care with patient. Patient in agreement.  RN CM will send quarterly update to PCP.   [name], RN,BSN,CCM [facility] Telephonic Care Management Coordinator Direct Phone: [phone] Toll Free: [phone] Fax: [phone]

## 2021-01-02 ENCOUNTER — Other Ambulatory Visit: Payer: Self-pay

## 2021-01-02 ENCOUNTER — Ambulatory Visit: Payer: Medicare HMO | Attending: Internal Medicine

## 2021-01-02 DIAGNOSIS — Z23 Encounter for immunization: Secondary | ICD-10-CM

## 2021-01-02 NOTE — Progress Notes (Signed)
   Covid-19 Vaccination Clinic  Name:  [name]    MRN: [id number] DOB: Aug 20, 1933  01/02/2021  [name] was observed post Covid-19 immunization for 15 minutes without incident. She was provided with Vaccine Information Sheet and instruction to access the V-Safe system.   [name] was instructed to call 911 with any severe reactions post vaccine: Marland Kitchen Difficulty breathing  . Swelling of face and throat  . A fast heartbeat  . A bad rash all over body  . Dizziness and weakness   Immunizations Administered    Name Date Dose VIS Date Route   PFIZER Comrnaty(Gray TOP) Covid-19 Vaccine 01/02/2021  1:25 PM 0.3 mL 11/02/2020 Intramuscular   Manufacturer: [place]   Lot: [id number]   NDC: [phone]

## 2021-01-03 ENCOUNTER — Other Ambulatory Visit: Payer: Self-pay | Admitting: Internal Medicine

## 2021-01-03 ENCOUNTER — Ambulatory Visit: Payer: Self-pay

## 2021-01-03 DIAGNOSIS — K219 Gastro-esophageal reflux disease without esophagitis: Secondary | ICD-10-CM

## 2021-01-05 DIAGNOSIS — G4733 Obstructive sleep apnea (adult) (pediatric): Secondary | ICD-10-CM | POA: Diagnosis not present

## 2021-01-09 ENCOUNTER — Other Ambulatory Visit: Payer: Self-pay | Admitting: Cardiology

## 2021-01-17 ENCOUNTER — Other Ambulatory Visit: Payer: Self-pay | Admitting: Internal Medicine

## 2021-01-17 ENCOUNTER — Telehealth: Payer: Self-pay | Admitting: Internal Medicine

## 2021-01-17 DIAGNOSIS — M8949 Other hypertrophic osteoarthropathy, multiple sites: Secondary | ICD-10-CM

## 2021-01-17 DIAGNOSIS — M159 Polyosteoarthritis, unspecified: Secondary | ICD-10-CM

## 2021-01-17 DIAGNOSIS — Z515 Encounter for palliative care: Secondary | ICD-10-CM

## 2021-01-17 MED ORDER — HYDROCODONE-ACETAMINOPHEN 5-325 MG PO TABS: 1.0000 | ORAL_TABLET | Freq: Four times a day (QID) | ORAL | 0 refills | Status: DC | PRN

## 2021-01-17 NOTE — Telephone Encounter (Signed)
1.Medication Requested: HYDROcodone-acetaminophen (NORCO/VICODIN) 5-325 MG tablet    2. Pharmacy (Name, Street, City): [id number] - [place],  - [address]  3. On Med List: yes   4. Last Visit with PCP: 09-19-20  5. Next visit date with PCP: 03-20-21   Agent: Please be advised that RX refills may take up to 3 business days. We ask that you follow-up with your pharmacy.  

## 2021-01-19 ENCOUNTER — Ambulatory Visit: Payer: Medicare HMO | Admitting: Cardiology

## 2021-01-19 NOTE — Progress Notes (Deleted)
Cardiology Office Note:    Date:  01/19/2021   ID:  [name], DOB 1933/09/15, MRN [id number]  PCP:  [name], MD  Cardiologist:  [name], MD PhD  Referring MD: [name], MD   CC: follow up  History of Present Illness:    [name] is a 87 y.o. female with a hx of hypertension who is seen for follow up today. I initially met her 07/16/19 as a new consult at the request of [name], MD for the evaluation and management of aortic stenosis and concern for diastolic heart failure.  Today: Reviewed hospitalization for hypertensive urgency 07/2020. Checking blood pressure daily at home. Doesn't have log today. This AM, was 130/60 at home. Has had intermittent lows, last about a week ago, when systolic was 440H. Felt lightheaded. No syncope. Hasn't seen any recent high numbers at home since her hospitalization. Uses wrist cuff as she cannot get arm cuff over her elbow.  She is feeling only "so-so" today. Feels like she is on too much medication. Reports that blood pressure has been better controlled at home. She "hates" carvedilol but is willing to take it, doesn't like that she has to take with food. Likes torsemide daily, feels that does well for her. Is taking hydralazine 50 mg twice daily, does not like how it makes her feel. Does not recall ever taking isosorbide, called her pharmacy and they report never having filled it.   Interested in healthy weight and wellness, wants to lose weight in a healthy way. Using her CPAP at home. Doing physical therapy now to help with joint pain.  Does not feel like she is holding on to fluid. Watches her fluid intake. No significant edema. Breathing is stable.   Past Medical History:  Diagnosis Date  . Abdominal pain   . Anemia    NOS  . Anxiety   . Back pain   . Bruises easily   . Chronic diastolic CHF (congestive heart failure) ([place])    a. 06/2013 EF 65-70%.  . Chronic headaches    HISTORY OF   . Depression    . Diabetes mellitus    type II  DIET CONTROLLED  . Diarrhea   . Hepatitis    AGE 30S  . Hyperlipidemia   . Hypertension   . Mild aortic stenosis    a. 06/2013 Echo: EF 65-70%, mod LVH with focal basal hypertrophy, very mild AS.  Marland Kitchen Osteoarthritis   . Oxygen desaturation during sleep    USES 2 LITERS BEDTIME   VIA CPAP 06/2012 [facility]   . Shortness of breath    WITH EXERTION USES 2 L O2 BEDTIME  . Sleep apnea    CPAP WITH O2 2 LITERS 2013 ([facility])  . Wears dentures   . Wears glasses     Past Surgical History:  Procedure Laterality Date  . ABDOMINAL HYSTERECTOMY    . APPENDECTOMY    . CHOLECYSTECTOMY    . JOINT REPLACEMENT     LEFT KNEE   . KNEE ARTHROPLASTY Right 03/22/2015   Procedure: COMPUTER ASSISTED TOTAL KNEE ARTHROPLASTY;  Surgeon: Marybelle Killings, MD;  Location: [place];  Service: Orthopedics;  Laterality: Right;  . KNEE ARTHROSCOPY     RIGHT  . LEFT AND RIGHT HEART CATHETERIZATION WITH CORONARY ANGIOGRAM N/A 10/28/2013   Procedure: LEFT AND RIGHT HEART CATHETERIZATION WITH CORONARY ANGIOGRAM;  Surgeon: Blane Ohara, MD;  Location[facility] C[facility] LAB;  Service: Cardiovascular;  Laterality: N/A;  . LEFT HEART CATHETERIZATION WITH CORONARY ANGIOGRAM N/A 11/14/2014   Procedure: LEFT HEART CATHETERIZATION WITH CORONARY ANGIOGRAM;  Surgeon: Josue Hector, MD;  Location: [facility] CATH LAB;  Service: Cardiovascular;  Laterality: N/A;  . REVISION TOTAL KNEE ARTHROPLASTY  2011  . ROTATOR CUFF REPAIR  2010  . SHOULDER ARTHROSCOPY  09/17/2012   Procedure: ARTHROSCOPY SHOULDER;  Surgeon: Sharmon Revere, MD;  Location: [place];  Service: Orthopedics;  Laterality: Left;  . TONSILLECTOMY    . TONSILLECTOMY      Current Medications: Current Outpatient Medications on File Prior to Visit  Medication Sig  . albuterol (PROVENTIL) (2.5 MG/3ML) 0.083% nebulizer solution INHALE 3 MLS(2.5 MG TOTAL) BY NEBULIZATION TWICE DAILY AS NEEDED FOR WHEEZING OR SHORTNESS OF BREATH  . carvedilol (COREG) 12.5  MG tablet TAKE 1 TABLET(12.5 MG) BY MOUTH TWICE DAILY WITH A MEAL (Patient taking differently: Take 12.5 mg by mouth 2 (two) times daily with a meal. )  . Cholecalciferol 2000 units TABS Take 2 tablets (4,000 Units total) by mouth daily.  . clonazePAM (KLONOPIN) 1 MG tablet Take 1 tablet (1 mg total) by mouth 2 (two) times daily as needed for anxiety.  . hydrALAZINE (APRESOLINE) 50 MG tablet Take 1 tablet (50 mg total) by mouth 2 (two) times daily.  Marland Kitchen HYDROcodone-acetaminophen (NORCO/VICODIN) 5-325 MG tablet Take 1 tablet by mouth every 6 (six) hours as needed.  Marland Kitchen MYRBETRIQ 50 MG TB24 tablet TAKE 1 TABLET(50 MG) BY MOUTH DAILY  . omeprazole (PRILOSEC) 40 MG capsule TAKE 1 CAPSULE(40 MG) BY MOUTH DAILY  . ondansetron (ZOFRAN) 4 MG tablet Take 1 tablet (4 mg total) by mouth every 8 (eight) hours as needed for nausea or vomiting.  Marland Kitchen spironolactone (ALDACTONE) 25 MG tablet Take 0.5 tablets (12.5 mg total) by mouth daily.  Marland Kitchen torsemide (DEMADEX) 20 MG tablet TAKE 1 TABLET(20 MG) BY MOUTH DAILY  . traZODone (DESYREL) 150 MG tablet TAKE 1 TABLET(150 MG) BY MOUTH AT BEDTIME   No current facility-administered medications on file prior to visit.     Allergies:   Amlodipine, Enalapril, Losartan potassium, Morphine and related, and Metformin   Social History   Tobacco Use  . Smoking status: Never Smoker  . Smokeless tobacco: Never Used  Vaping Use  . Vaping Use: Never used  Substance Use Topics  . Alcohol use: No  . Drug use: No    Family History: The patient's family history includes Alcohol abuse in an other family member; Cancer in her father; Emphysema in her brother; Heart disease in her brother and sister; Hypertension in an other family member; Kidney disease in an other family member; Mental illness in an other family member.  ROS:   Please see the history of present illness.  Additional pertinent ROS otherwise unremarkable  EKGs/Labs/Other Studies Reviewed:    The following studies  were reviewed today: Echo 07/22/19  1. The left ventricle has normal systolic function with an ejection fraction of 60-65%. The cavity size was normal. Severe basal septal hypertrophy. Left ventricular diastolic Doppler parameters are consistent with pseudonormalization.  2. The right ventricle has normal systolic function. The cavity was normal. There is no increase in right ventricular wall thickness. Right ventricular systolic pressure could not be assessed.  3. There is mild mitral annular calcification present.  4. The aortic valve is tricuspid. Moderate calcification of the aortic valve. Aortic valve regurgitation is trivial by color flow Doppler.  5. The aorta is normal unless otherwise noted.  6.  The interatrial septum appears to be lipomatous.  EKG:  EKG is personally reviewed.  The ekg ordered 08/11/20 demonstrates sinus rhythm at 67 bpm  Recent Labs: 04/10/2020: TSH 3.85 08/11/2020: ALT 14 08/12/2020: B Natriuretic Peptide 109.4 08/13/2020: BUN 15; Creatinine, Ser 0.92; Hemoglobin 11.8; Magnesium 1.6; Platelets 137; Potassium 3.9; Sodium 138  Recent Lipid Panel    Component Value Date/Time   CHOL 186 04/10/2020 1403   TRIG 110.0 04/10/2020 1403   HDL 57.90 04/10/2020 1403   CHOLHDL 3 04/10/2020 1403   VLDL 22.0 04/10/2020 1403   LDLCALC 106 (H) 04/10/2020 1403   LDLDIRECT 121.0 06/12/2017 1349    Physical Exam:    VS:  There were no vitals taken for this visit.    Wt Readings from Last 3 Encounters:  09/26/20 218 lb 6.4 oz (99.1 kg)  09/19/20 218 lb (98.9 kg)  08/30/20 218 lb (98.9 kg)    GEN: Well nourished, well developed in no acute distress HEENT: Normal, moist mucous membranes NECK: No JVD CARDIAC: regular rhythm, normal S1 and S2, no rubs or gallops. 2/6 systolic ejection murmur. VASCULAR: Radial and DP pulses 2+ bilaterally. No carotid bruits RESPIRATORY:  Clear to auscultation without rales, wheezing or rhonchi  ABDOMEN: Soft, non-tender,  non-distended MUSCULOSKELETAL:  Ambulates independently SKIN: Warm and dry, no edema NEUROLOGIC:  Alert and oriented x 3. No focal neuro deficits noted. PSYCHIATRIC:  Normal affect   ASSESSMENT:    No diagnosis found. PLAN:    Chronic diastolic heart failure:  -appears euvolemic on exam -reviewed heart failure education, including daily weights, salt avoidance -discussed the importance of good BP control  Hypertension: prior hospitalization for hypertensive urgency 07/2020 -continue carvedilol 12.5 mg BID. Avoid bradycardia due to diastolic dysfunction -continue spirinolactone 25 mg daily -continue torsemide 20 mg daily -continue hydralazine 50 mg BID -never had isosorbide filled  Murmur: aortic sclerosis with mild stenosis. Unchanged on exam today  Plan for follow up: 6 mos or sooner PRN  [name], MD, PhD [place]  [facility]   Medication Adjustments/Labs and Tests Ordered: Current medicines are reviewed at length with the patient today.  Concerns regarding medicines are outlined above.  No orders of the defined types were placed in this encounter.  No orders of the defined types were placed in this encounter.   There are no Patient Instructions on file for this visit.   Signed, [name], MD PhD 01/19/2021     [place]

## 2021-01-23 ENCOUNTER — Other Ambulatory Visit: Payer: Self-pay

## 2021-01-23 ENCOUNTER — Ambulatory Visit (INDEPENDENT_AMBULATORY_CARE_PROVIDER_SITE_OTHER): Payer: Medicare HMO

## 2021-01-23 VITALS — BP 120/70 | HR 68 | Temp 98.2°F | Ht 62.0 in | Wt 214.0 lb

## 2021-01-23 DIAGNOSIS — Z0000 Encounter for general adult medical examination without abnormal findings: Secondary | ICD-10-CM | POA: Diagnosis not present

## 2021-01-23 NOTE — Progress Notes (Signed)
Subjective:   [name] is a 87 y.o. female who presents for Medicare Annual (Subsequent) preventive examination.  Review of Systems    No ROS. Medicare Wellness Visit. Additional risk factors are reflected in social history. Cardiac Risk Factors include: advanced age (>58men, >77 women);hypertension;obesity (BMI >30kg/m2);family history of premature cardiovascular disease     Objective:    Today's Vitals   01/23/21 1335  BP: 120/70  Pulse: 68  Temp: 98.2 F (36.8 C)  SpO2: 92%  Weight: 214 lb (97.1 kg)  Height: 5\' 2"  (1.575 m)  PainSc: 0-No pain   Body mass index is 39.14 kg/m.  Advanced Directives 01/23/2021 09/21/2020 04/25/2020 09/06/2019 08/19/2019 01/29/2019 08/26/2018  Does Patient Have a Medical Advance Directive? Yes Yes No Yes Yes Yes Yes  Type of Paramedic of [place];Living will [place];Living will - [place];Living will [place];Living will [place];Living will [place]  Does patient want to make changes to medical advance directive? No - Patient declined - - No - Patient declined - No - Patient declined No - Patient declined  Copy of [place] in Chart? No - copy requested - - No - copy requested No - copy requested No - copy requested No - copy requested  Would patient like information on creating a medical advance directive? - - No - Patient declined - - - -  Pre-existing out of facility DNR order (yellow form or pink MOST form) - - - - - - -    Current Medications (verified) Outpatient Encounter Medications as of 01/23/2021  Medication Sig  . albuterol (PROVENTIL) (2.5 MG/3ML) 0.083% nebulizer solution INHALE 3 MLS(2.5 MG TOTAL) BY NEBULIZATION TWICE DAILY AS NEEDED FOR WHEEZING OR SHORTNESS OF BREATH  . carvedilol (COREG) 12.5 MG tablet TAKE 1 TABLET(12.5 MG) BY MOUTH TWICE DAILY WITH A MEAL (Patient taking differently:  Take 12.5 mg by mouth 2 (two) times daily with a meal.)  . Cholecalciferol 2000 units TABS Take 2 tablets (4,000 Units total) by mouth daily.  . clonazePAM (KLONOPIN) 1 MG tablet Take 1 tablet (1 mg total) by mouth 2 (two) times daily as needed for anxiety.  . hydrALAZINE (APRESOLINE) 50 MG tablet Take 1 tablet (50 mg total) by mouth 2 (two) times daily.  Marland Kitchen HYDROcodone-acetaminophen (NORCO/VICODIN) 5-325 MG tablet Take 1 tablet by mouth every 6 (six) hours as needed.  Marland Kitchen MYRBETRIQ 50 MG TB24 tablet TAKE 1 TABLET(50 MG) BY MOUTH DAILY  . omeprazole (PRILOSEC) 40 MG capsule TAKE 1 CAPSULE(40 MG) BY MOUTH DAILY  . spironolactone (ALDACTONE) 25 MG tablet Take 0.5 tablets (12.5 mg total) by mouth daily.  Marland Kitchen torsemide (DEMADEX) 20 MG tablet TAKE 1 TABLET(20 MG) BY MOUTH DAILY  . traZODone (DESYREL) 150 MG tablet TAKE 1 TABLET(150 MG) BY MOUTH AT BEDTIME  . ondansetron (ZOFRAN) 4 MG tablet Take 1 tablet (4 mg total) by mouth every 8 (eight) hours as needed for nausea or vomiting. (Patient not taking: Reported on 01/23/2021)   No facility-administered encounter medications on file as of 01/23/2021.    Allergies (verified) Amlodipine, Enalapril, Losartan potassium, Morphine and related, and Metformin   History: Past Medical History:  Diagnosis Date  . Abdominal pain   . Anemia    NOS  . Anxiety   . Back pain   . Bruises easily   . Chronic diastolic CHF (congestive heart failure) ([place])    a. 06/2013  EF 65-70%.  . Chronic headaches    HISTORY OF   . Depression   . Diabetes mellitus    type II  DIET CONTROLLED  . Diarrhea   . Hepatitis    AGE 30S  . Hyperlipidemia   . Hypertension   . Mild aortic stenosis    a. 06/2013 Echo: EF 65-70%, mod LVH with focal basal hypertrophy, very mild AS.  Marland Kitchen Osteoarthritis   . Oxygen desaturation during sleep    USES 2 LITERS BEDTIME   VIA CPAP 06/2012 [facility]   . Shortness of breath    WITH EXERTION USES 2 L O2 BEDTIME  . Sleep apnea    CPAP WITH O2  2 LITERS 2013 ([facility])  . Wears dentures   . Wears glasses    Past Surgical History:  Procedure Laterality Date  . ABDOMINAL HYSTERECTOMY    . APPENDECTOMY    . CHOLECYSTECTOMY    . JOINT REPLACEMENT     LEFT KNEE   . KNEE ARTHROPLASTY Right 03/22/2015   Procedure: COMPUTER ASSISTED TOTAL KNEE ARTHROPLASTY;  Surgeon: Marybelle Killings, MD;  Location: [facility];  Service: Orthopedics;  Laterality: Right;  . KNEE ARTHROSCOPY     RIGHT  . LEFT AND RIGHT HEART CATHETERIZATION WITH CORONARY ANGIOGRAM N/A 10/28/2013   Procedure: LEFT AND RIGHT HEART CATHETERIZATION WITH CORONARY ANGIOGRAM;  Surgeon: Blane Ohara, MD;  Location[facility] C[facility] LAB;  Service: Cardiovascular;  Laterality: N/A;  . LEFT HEART CATHETERIZATION WITH CORONARY ANGIOGRAM N/A 11/14/2014   Procedure: LEFT HEART CATHETERIZATION WITH CORONARY ANGIOGRAM;  Surgeon: Josue Hector, MD;  [facility];  Service: Cardiovascular;  Laterality: N/A;  . REVISION TOTAL KNEE ARTHROPLASTY  2011  . ROTATOR CUFF REPAIR  2010  . SHOULDER ARTHROSCOPY  09/17/2012   Procedure: ARTHROSCOPY SHOULDER;  Surgeon: Sharmon Revere, MD;  Location: [facility];  Service: Orthopedics;  Laterality: Left;  . TONSILLECTOMY    . TONSILLECTOMY     Family History  Problem Relation Age of Onset  . Cancer Father        prostate cancer  . Heart disease Sister   . Heart disease Brother   . Alcohol abuse Other   . Hypertension Other   . Kidney disease Other   . Mental illness Other   . Emphysema Brother    Social History   Socioeconomic History  . Marital status: Widowed    Spouse name: Not on file  . Number of children: 5  . Years of education: Not on file  . Highest education level: Not on file  Occupational History  . Occupation: retired     Fish farm manager: RETIRED    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[employer]  . Smoking status: Never Smoker  . Smokeless tobacco: Never Used  Vaping Use  . Vaping Use: Never used  Substance and Sexual Activity  . Alcohol  use: No  . Drug use: No  . Sexual activity: Never  Other Topics Concern  . Not on file  Social History Narrative  . Not on file   Social Determinants of Health   Financial Resource Strain: Low Risk   . Difficulty of Paying Living Expenses: Not hard at all  Food Insecurity: No Food Insecurity  . Worried About Charity fundraiser in the Last Year: Never true  . Ran Out of Food in the Last Year: Never true  Transportation Needs: No Transportation Needs  . Lack of Transportation (Medical): No  .  Lack of Transportation (Non-Medical): No  Physical Activity: Sufficiently Active  . Days of Exercise per Week: 5 days  . Minutes of Exercise per Session: 30 min  Stress: No Stress Concern Present  . Feeling of Stress : Not at all  Social Connections: Moderately Integrated  . Frequency of Communication with Friends and Family: More than three times a week  . Frequency of Social Gatherings with Friends and Family: More than three times a week  . Attends Religious Services: More than 4 times per year  . Active Member of Clubs or Organizations: No  . Attends Archivist Meetings: More than 4 times per year  . Marital Status: Widowed    Tobacco Counseling Counseling given: Not Answered   Clinical Intake:  Pre-visit preparation completed: Yes  Pain : No/denies pain Pain Score: 0-No pain     BMI - recorded: 39.14 Nutritional Status: BMI > 30  Obese Nutritional Risks: None Diabetes: No  How often do you need to have someone help you when you read instructions, pamphlets, or other written materials from your doctor or pharmacy?: 1 - Never What is the last grade level you completed in school?: [place]  Diabetic? no  Interpreter Needed?: No  Information entered by :: Lisette Abu, LPN.   Activities of Daily Living In your present state of health, do you have any difficulty performing the following activities: 01/23/2021 08/13/2020  Hearing? N -  Comment wears  hearing aids -  Vision? N -  Difficulty concentrating or making decisions? N -  Walking or climbing stairs? N -  Comment - -  Dressing or bathing? N -  Doing errands, shopping? N N  Preparing Food and eating ? N -  Using the Toilet? N -  In the past six months, have you accidently leaked urine? Y -  Comment wears pull-ups daily -  Do you have problems with loss of bowel control? Y -  Managing your Medications? N -  Managing your Finances? N -  Housekeeping or managing your Housekeeping? - -  Some recent data might be hidden    Patient Care Team: [name], MD as PCP - General [name], MD as PCP - Cardiology (Cardiology) [name], MD as Consulting Physician (Cardiology) Florance, Tomasa Blase, RN as [place] Management Folt[place]ki, Cleaster Cor[place], [facility] as Pharmacist (Pharmacist) Hortencia Pilar, MD as Consulting Physician (Ophthalmology)  Indicate any recent Medical Services you may have received from other than [facility] providers in the past year (date may be approximate).     Assessment:   This is a routine wellness examination for [place].  Hearing/Vision screen No exam data present  Dietary issues and exercise activities discussed: Current Exercise Habits: Home exercise routine, Type of exercise: walking;Other - see comments (yard work, house work, TV exercises, chair exercises), Time (Minutes): 30, Frequency (Times/Week): 5, Weekly Exercise (Minutes/Week): 150, Intensity: Mild, Exercise limited by: orthopedic condition(s);psychological condition(s);cardiac condition(s)  Goals    .  (THN)Make and Keep All Appointments (pt-stated)      Timeframe:  Long-Range Goal Priority:  High Start Date:  12/26/2020                           Expected End Date:   May 2022                    Follow Up Date May 2022 -Make an appt to get COVID-19 booster shot  Why is this important?    Part of staying healthy is seeing the doctor  for follow-up care.   If you forget your appointments, there are some things you can do to stay on track.    Notes:  01/15/21-Patient past due for booster shot. RN CM educated pt on importance of getting vaccinated and provided info on how to obtain one. Patient states she only wanted to go to a [facility] vaccine clinic and not use local pharmacies.      .  (THN)Track and Manage Fluids and Swelling      Timeframe:  Long-Range Goal Priority:  High Start Date: 09/04/20                       Expected End Date:  May 2022  Follow Up Date May 2022   - call office if I gain more than 2 pounds in one day or 5 pounds in one week - keep legs up while sitting - use salt in moderation - watch for swelling in feet, ankles and legs every day - weigh myself daily    Why is this important?   It is important to check your weight daily and watch how much salt and liquids you have.  It will help you to manage your heart failure.    Notes:  12/27/20-Wgt stable. Patient has lost about 9 lbs. She reports adhering to diuretic regimen. She denies an edema or SOB during this call.    .  (THN)Track and Manage My Blood Pressure      Timeframe:  Long-Range Goal Priority:  High Start Date:  09/04/20                           Expected End Date:  May 2022                    Follow Up Date   May 2022 - check blood pressure daily - write blood pressure results in a log or diary    Why is this important?   You won't feel high blood pressure, but it can still hurt your blood vessels.  High blood pressure can cause heart or kidney problems. It can also cause a stroke.  Making lifestyle changes like losing a little weight or eating less salt will help.  Checking your blood pressure at home and at different times of the day can help to control blood pressure.  If the doctor prescribes medicine remember to take it the way the doctor ordered.  Call the office if you cannot afford the medicine or if there are questions  about it.     Notes: 12/26/20-RN CM did not get to address this call-patient had to end call early    .  (THN)Track and Manage Symptoms      Timeframe:  Long-Range Goal Priority:  High Start Date:   09/04/20                          Expected End Date:   04/23/2021                   Follow Up Date May 2022   - develop a rescue plan - eat more whole grains, fruits and vegetables, lean meats and healthy fats - follow rescue plan if symptoms flare-up - know when to call the doctor - track symptoms and what  helps feel better or worse    Why is this important?   You will be able to handle your symptoms better if you keep track of them.  Making some simple changes to your lifestyle will help.  Eating healthy is one thing you can do to take good care of yourself.    Notes:  12/26/20-Patient reports that he forgets or does not allows feel like monitoring wgt. Reinforced to patient importance of wgt monitoring and proper technique as well as when to seek medical attention. Wgt stable at 209 lbs.     .  Patient Stated      Continue to take care of my plants. Increase my physical activity. Enjoy life and family.    Marland Kitchen  Pharmacy Care Plan      CARE PLAN ENTRY  Current Barriers:  . Chronic Disease Management support, education, and care coordination needs related to Hypertension, Hyperlipidemia, Heart Failure, and Insomnia , Overactive bladder   Hypertension/Heart Failure BP Readings from Last 3 Encounters:  04/10/20 134/68  03/30/20 (!) 148/76  03/09/20 132/62 .  Pharmacist Clinical Goal(s): o Over the next 90 days, patient will work with PharmD and providers to maintain BP goal <140/90 . Current regimen:  o carvedilol 12.5 mg twice a day  o hydralazine 50 mg twice a day  o isosorbide MN 20 mg daily,  o spironolactone 12.5 mg (1/2 of 25 mg) daily,  o torsemide 20 mg daily . Interventions: o Discussed BP goal and benefits of medications for maintaining BP in target range o Discussed  low blood pressure (less than 110/60) can cause dizziness o Counseled to stand slowly and ensure steadiness before moving to prevent falls . Patient self care activities - Over the next 90 days, patient will: o Check BP daily, document, and provide at future appointments o Ensure daily salt intake < 2300 mg/day o Stand slowly to prevent falls  Hyperlipidemia Lab Results  Component Value Date   LDLCALC 106 (H) 04/10/2020   LDLDIRECT 121.0 06/12/2017 .  Pharmacist Clinical Goal(s): o Over the next 90 days, patient will work with PharmD and providers to maintain LDL goal < 130 . Current regimen:  o No medications . Interventions: o Discussed cholesterol goals and consequences of high cholesterol . Patient self care activities - Over the next 90 days, patient will: o Reduce cholesterol in diet  Insomnia / Anxiety . Pharmacist Clinical Goal(s) o Over the next 90 days, patient will work with PharmD and providers to optimize therapy . Current regimen:  o Trazodone 150 mg at bedtime o Clonazepam 1 mg twice daily . Interventions: o Discussed alternative medications for anxiety that may also help with fatigue; ultimately decided to keep same medications and follow up with PCP . Patient self care activities - Over the next 90 days, patient will: o Take medications as prescribed o Follow up with PCP as scheduled  Overactive bladder . Pharmacist Clinical Goal(s) o Over the next 90 days, patient will work with PharmD and providers to optimize therapy . Current regimen:  o Myrbetriq 50 mg daily o Vesicare 10 mg daily . Interventions: o Discussed using medications together may improve symptoms more than either alone . Patient self care activities - Over the next 90 days, patient will: o Continue medications as prescribed  Medication management . Pharmacist Clinical Goal(s): o Over the next 90 days, patient will work with PharmD and providers to achieve optimal medication  adherence . Current pharmacy: [facility] . Interventions o Comprehensive medication  review performed. o Switch to [facility] order pharmacy for lower copays . Patient self care activities - Over the next 90 days, patient will: o Focus on medication adherence by fill date o Take medications as prescribed o Report any questions or concerns to PharmD and/or provider(s)  Please see past updates related to this goal by clicking on the "Past Updates" button in the selected goal       Depression Screen PHQ 2/9 Scores 01/23/2021 04/25/2020 09/06/2019 08/24/2019 08/19/2019 07/13/2019 01/05/2019  PHQ - 2 Score 0 0 0 0 3 4 3   PHQ- 9 Score - - - - 9 9 10   Exception Documentation - - - - - - -  Not completed - - - - - - -    Fall Risk Fall Risk  01/23/2021 12/26/2020 09/04/2020 05/24/2020 04/25/2020  Falls in the past year? 1 0 0 1 0  Number falls in past yr: 0 0 0 0 0  Injury with Fall? 1 0 0 1 0  Risk Factor Category  - - - - -  Risk for fall due to : Impaired balance/gait;Orthopedic patient Impaired balance/gait;Impaired mobility;Medication side effect Impaired balance/gait;Medication side effect;Impaired mobility;Impaired vision Impaired mobility;Impaired vision;Impaired balance/gait Impaired vision;Medication side effect  Follow up - Falls evaluation completed;Education provided Falls evaluation completed;Education provided Falls evaluation completed Falls evaluation completed;Education provided    FALL RISK PREVENTION PERTAINING TO THE HOME:  Any stairs in or around the home? No  If so, are there any without handrails? No  Home free of loose throw rugs in walkways, pet beds, electrical cords, etc? Yes  Adequate lighting in your home to reduce risk of falls? Yes   ASSISTIVE DEVICES UTILIZED TO PREVENT FALLS:  Life alert? No  Use of a cane, walker or w/c? Yes  Grab bars in the bathroom? Yes  Shower chair or bench in shower? Yes  Elevated toilet seat or a handicapped toilet? Yes   TIMED UP AND  GO:  Was the test performed? No .  Length of time to ambulate 10 feet: 0 sec.   Gait slow and steady with assistive device  Cognitive Function:     6CIT Screen 08/19/2019  What Year? 0 points  What month? 0 points  What time? 0 points  Count back from 20 2 points  Months in reverse 4 points  Repeat phrase 2 points  Total Score 8    Immunizations Immunization History  Administered Date(s) Administered  . Fluad Quad(high Dose 65+) 08/19/2019  . Influenza Split 08/20/2012  . Influenza, High Dose Seasonal PF 08/02/2015, 09/10/2016, 09/17/2017, 09/15/2018  . Influenza,inj,Quad PF,6+ Mos 10/28/2013, 11/28/2014  . Influenza-Unspecified 08/16/2020  . PFIZER Comirnaty(Gray Top)Covid-19 Tri-Sucrose Vaccine 01/02/2021  . PFIZER(Purple Top)SARS-COV-2 Vaccination 12/30/2019, 01/20/2020  . Pneumococcal Conjugate-13 05/25/2014  . Pneumococcal Polysaccharide-23 04/16/2012, 09/15/2018  . Td 04/25/2010  . Tdap 04/10/2020  . Zoster 11/28/2014    TDAP status: Up to date  Flu Vaccine status: Up to date  Pneumococcal vaccine status: Up to date  Covid-19 vaccine status: Completed vaccines  Qualifies for Shingles Vaccine? Yes   Zostavax completed Yes   Shingrix Completed?: No.    Education has been provided regarding the importance of this vaccine. Patient has been advised to call insurance company to determine out of pocket expense if they have not yet received this vaccine. Advised may also receive vaccine at local pharmacy or Health Dept. Verbalized acceptance and understanding.  Screening Tests Health Maintenance  Topic Date Due  . TETANUS/TDAP  04/10/2030  . INFLUENZA VACCINE  Completed  . DEXA SCAN  Completed  . COVID-19 Vaccine  Completed  . PNA vac Low Risk Adult  Completed  . HPV VACCINES  Aged Out    Health Maintenance  There are no preventive care reminders to display for this patient.  Colorectal cancer screening: No longer required.   Mammogram status: Ordered  01/23/2021. Pt provided with contact info and advised to call to schedule appt.   Bone Density status: Completed 05/11/2013. Results reflect: Bone density results: NORMAL. Repeat every 0 years.  Lung Cancer Screening: (Low Dose CT Chest recommended if Age 15-80 years, 30 pack-year currently smoking OR have quit w/in 15years.) does not qualify.   Lung Cancer Screening Referral: no  Additional Screening:  Hepatitis C Screening: does not qualify; Completed no  Vision Screening: Recommended annual ophthalmology exams for early detection of glaucoma and other disorders of the eye. Is the patient up to date with their annual eye exam?  Yes  Who is the provider or what is the name of the office in which the patient attends annual eye exams? Marshall Cork, MD If pt is not established with a provider, would they like to be referred to a provider to establish care? No .   Dental Screening: Recommended annual dental exams for proper oral hygiene  Community Resource Referral / Chronic Care Management: CRR required this visit?  No   CCM required this visit?  No      Plan:     I have personally reviewed and noted the following in the patient's chart:   . Medical and social history . Use of alcohol, tobacco or illicit drugs  . Current medications and supplements . Functional ability and status . Nutritional status . Physical activity . Advanced directives . List of other physicians . Hospitalizations, surgeries, and ER visits in previous 12 months . Vitals . Screenings to include cognitive, depression, and falls . Referrals and appointments  In addition, I have reviewed and discussed with patient certain preventive protocols, quality metrics, and best practice recommendations. A written personalized care plan for preventive services as well as general preventive health recommendations were provided to patient.     Sheral Flow, LPN   11/28/7090   Nurse Notes:  Medications  reviewed with patient; opioid use noted.

## 2021-01-23 NOTE — Patient Instructions (Signed)
Ms. Quesada , Thank you for taking time to come for your Medicare Wellness Visit. I appreciate your ongoing commitment to your health goals. Please review the following plan we discussed and let me know if I can assist you in the future.   Screening recommendations/referrals: Colonoscopy: no repeat due to age Mammogram: 05/08/2016; order due to left breast pain for 2-3 months Bone Density: 05/11/2013 Recommended yearly ophthalmology/optometry visit for glaucoma screening and checkup Recommended yearly dental visit for hygiene and checkup  Vaccinations: Influenza vaccine: 08/16/2020 Pneumococcal vaccine: 05/25/2014, 09/15/2018 Tdap vaccine: 04/10/2020 Shingles vaccine: never done   Covid-19: 12/30/2019, 01/20/2020, 01/02/2021  Advanced directives: Please bring a copy of your health care power of attorney and living will to the office at your convenience.  Conditions/risks identified: Yes; Reviewed health maintenance screenings with patient today and relevant education, vaccines, and/or referrals were provided. Please continue to do your personal lifestyle choices by: daily care of teeth and gums, regular physical activity (goal should be 5 days a week for 30 minutes), eat a healthy diet, avoid tobacco and drug use, limiting any alcohol intake, taking a low-dose aspirin (if not allergic or have been advised by your provider otherwise) and taking vitamins and minerals as recommended by your provider. Continue doing brain stimulating activities (puzzles, reading, adult coloring books, staying active) to keep memory sharp. Continue to eat heart healthy diet (full of fruits, vegetables, whole grains, lean protein, water--limit salt, fat, and sugar intake) and increase physical activity as tolerated.  Next appointment: Please schedule your next Medicare Wellness Visit with your Nurse Health Advisor in 1 year by calling [phone].  Preventive Care 11 Years and Older, Female Preventive care refers to lifestyle  choices and visits with your health care provider that can promote health and wellness. What does preventive care include?  A yearly physical exam. This is also called an annual well check.  Dental exams once or twice a year.  Routine eye exams. Ask your health care provider how often you should have your eyes checked.  Personal lifestyle choices, including:  Daily care of your teeth and gums.  Regular physical activity.  Eating a healthy diet.  Avoiding tobacco and drug use.  Limiting alcohol use.  Practicing safe sex.  Taking low-dose aspirin every day.  Taking vitamin and mineral supplements as recommended by your health care provider. What happens during an annual well check? The services and screenings done by your health care provider during your annual well check will depend on your age, overall health, lifestyle risk factors, and family history of disease. Counseling  Your health care provider may ask you questions about your:  Alcohol use.  Tobacco use.  Drug use.  Emotional well-being.  Home and relationship well-being.  Sexual activity.  Eating habits.  History of falls.  Memory and ability to understand (cognition).  Work and work Statistician.  Reproductive health. Screening  You may have the following tests or measurements:  Height, weight, and BMI.  Blood pressure.  Lipid and cholesterol levels. These may be checked every 5 years, or more frequently if you are over 6 years old.  Skin check.  Lung cancer screening. You may have this screening every year starting at age 72 if you have a 30-pack-year history of smoking and currently smoke or have quit within the past 15 years.  Fecal occult blood test (FOBT) of the stool. You may have this test every year starting at age 52.  Flexible sigmoidoscopy or colonoscopy. You may have a  sigmoidoscopy every 5 years or a colonoscopy every 10 years starting at age 48.  Hepatitis C blood  test.  Hepatitis B blood test.  Sexually transmitted disease (STD) testing.  Diabetes screening. This is done by checking your blood sugar (glucose) after you have not eaten for a while (fasting). You may have this done every 1-3 years.  Bone density scan. This is done to screen for osteoporosis. You may have this done starting at age 9.  Mammogram. This may be done every 1-2 years. Talk to your health care provider about how often you should have regular mammograms. Talk with your health care provider about your test results, treatment options, and if necessary, the need for more tests. Vaccines  Your health care provider may recommend certain vaccines, such as:  Influenza vaccine. This is recommended every year.  Tetanus, diphtheria, and acellular pertussis (Tdap, Td) vaccine. You may need a Td booster every 10 years.  Zoster vaccine. You may need this after age 39.  Pneumococcal 13-valent conjugate (PCV13) vaccine. One dose is recommended after age 16.  Pneumococcal polysaccharide (PPSV23) vaccine. One dose is recommended after age 62. Talk to your health care provider about which screenings and vaccines you need and how often you need them. This information is not intended to replace advice given to you by your health care provider. Make sure you discuss any questions you have with your health care provider. Document Released: 12/08/2015 Document Revised: 07/31/2016 Document Reviewed: 09/12/2015 Elsevier Interactive Patient Education  2017 [place] Prevention in the Home Falls can cause injuries. They can happen to people of all ages. There are many things you can do to make your home safe and to help prevent falls. What can I do on the outside of my home?  Regularly fix the edges of walkways and driveways and fix any cracks.  Remove anything that might make you trip as you walk through a door, such as a raised step or threshold.  Trim any bushes or trees on the  path to your home.  Use bright outdoor lighting.  Clear any walking paths of anything that might make someone trip, such as rocks or tools.  Regularly check to see if handrails are loose or broken. Make sure that both sides of any steps have handrails.  Any raised decks and porches should have guardrails on the edges.  Have any leaves, snow, or ice cleared regularly.  Use sand or salt on walking paths during winter.  Clean up any spills in your garage right away. This includes oil or grease spills. What can I do in the bathroom?  Use night lights.  Install grab bars by the toilet and in the tub and shower. Do not use towel bars as grab bars.  Use non-skid mats or decals in the tub or shower.  If you need to sit down in the shower, use a plastic, non-slip stool.  Keep the floor dry. Clean up any water that spills on the floor as soon as it happens.  Remove soap buildup in the tub or shower regularly.  Attach bath mats securely with double-sided non-slip rug tape.  Do not have throw rugs and other things on the floor that can make you trip. What can I do in the bedroom?  Use night lights.  Make sure that you have a light by your bed that is easy to reach.  Do not use any sheets or blankets that are too big for your bed. They  should not hang down onto the floor.  Have a firm chair that has side arms. You can use this for support while you get dressed.  Do not have throw rugs and other things on the floor that can make you trip. What can I do in the kitchen?  Clean up any spills right away.  Avoid walking on wet floors.  Keep items that you use a lot in easy-to-reach places.  If you need to reach something above you, use a strong step stool that has a grab bar.  Keep electrical cords out of the way.  Do not use floor polish or wax that makes floors slippery. If you must use wax, use non-skid floor wax.  Do not have throw rugs and other things on the floor that can  make you trip. What can I do with my stairs?  Do not leave any items on the stairs.  Make sure that there are handrails on both sides of the stairs and use them. Fix handrails that are broken or loose. Make sure that handrails are as long as the stairways.  Check any carpeting to make sure that it is firmly attached to the stairs. Fix any carpet that is loose or worn.  Avoid having throw rugs at the top or bottom of the stairs. If you do have throw rugs, attach them to the floor with carpet tape.  Make sure that you have a light switch at the top of the stairs and the bottom of the stairs. If you do not have them, ask someone to add them for you. What else can I do to help prevent falls?  Wear shoes that:  Do not have high heels.  Have rubber bottoms.  Are comfortable and fit you well.  Are closed at the toe. Do not wear sandals.  If you use a stepladder:  Make sure that it is fully opened. Do not climb a closed stepladder.  Make sure that both sides of the stepladder are locked into place.  Ask someone to hold it for you, if possible.  Clearly mark and make sure that you can see:  Any grab bars or handrails.  First and last steps.  Where the edge of each step is.  Use tools that help you move around (mobility aids) if they are needed. These include:  Canes.  Walkers.  Scooters.  Crutches.  Turn on the lights when you go into a dark area. Replace any light bulbs as soon as they burn out.  Set up your furniture so you have a clear path. Avoid moving your furniture around.  If any of your floors are uneven, fix them.  If there are any pets around you, be aware of where they are.  Review your medicines with your doctor. Some medicines can make you feel dizzy. This can increase your chance of falling. Ask your doctor what other things that you can do to help prevent falls. This information is not intended to replace advice given to you by your health care  provider. Make sure you discuss any questions you have with your health care provider. Document Released: 09/07/2009 Document Revised: 04/18/2016 Document Reviewed: 12/16/2014 Elsevier Interactive Patient Education  2017 [employer].

## 2021-01-28 ENCOUNTER — Other Ambulatory Visit: Payer: Self-pay | Admitting: Internal Medicine

## 2021-01-28 DIAGNOSIS — I5032 Chronic diastolic (congestive) heart failure: Secondary | ICD-10-CM

## 2021-01-28 DIAGNOSIS — I10 Essential (primary) hypertension: Secondary | ICD-10-CM

## 2021-02-06 ENCOUNTER — Encounter: Payer: Self-pay | Admitting: Cardiology

## 2021-02-06 ENCOUNTER — Other Ambulatory Visit: Payer: Self-pay

## 2021-02-06 ENCOUNTER — Ambulatory Visit: Payer: Medicare HMO | Admitting: Cardiology

## 2021-02-06 VITALS — BP 124/76 | HR 90 | Ht 62.0 in | Wt 207.0 lb

## 2021-02-06 DIAGNOSIS — I5032 Chronic diastolic (congestive) heart failure: Secondary | ICD-10-CM

## 2021-02-06 DIAGNOSIS — Z7189 Other specified counseling: Secondary | ICD-10-CM

## 2021-02-06 DIAGNOSIS — I350 Nonrheumatic aortic (valve) stenosis: Secondary | ICD-10-CM

## 2021-02-06 DIAGNOSIS — I10 Essential (primary) hypertension: Secondary | ICD-10-CM | POA: Diagnosis not present

## 2021-02-06 NOTE — Patient Instructions (Addendum)
Medication Instructions:  Take torsemide 20 mg every other day for the next few weeks. We will check in and see how this is going. If you have more shortness of breath or swelling please call us.  *If you need a refill on your cardiac medications before your next appointment, please call your pharmacy*   Lab Work: None   Testing/Procedures: None   Follow-Up: At [facility], you and your health needs are our priority.  As part of our continuing mission to provide you with exceptional heart care, we have created designated Provider Care Teams.  These Care Teams include your primary Cardiologist (physician) and Advanced Practice Providers (APPs -  Physician Assistants and Nurse Practitioners) who all work together to provide you with the care you need, when you need it.  We recommend signing up for the patient portal called "MyChart".  Sign up information is provided on this After Visit Summary.  MyChart is used to connect with patients for Virtual Visits (Telemedicine).  Patients are able to view lab/test results, encounter notes, upcoming appointments, etc.  Non-urgent messages can be sent to your provider as well.   To learn more about what you can do with MyChart, go to [email or web address].    Your next appointment:   3-4 week(s)  The format for your next appointment:   In Person  Provider:   [name], MD

## 2021-02-06 NOTE — Progress Notes (Signed)
Cardiology Office Note:    Date:  02/06/2021   ID:  [name], DOB [birth year], MRN [id number]  PCP:  [name], MD  Cardiologist:  [name], MD PhD  Referring MD: [name], MD   CC: follow up  History of Present Illness:    [name] is a 87 y.o. female with a hx of hypertension who is seen for follow up today. I initially met her 07/16/19 as a new consult at the request of [name], MD for the evaluation and management of aortic stenosis and concern for diastolic heart failure.  Today: Feels that breathing is fine. Feels like when she takes all of her medications she doesn't feel well. She would like to cut back, but we reviewed her prior shortness of breath and hypertensive urgency. She has declined higher doses of carvedilol in the past. She urinates a lot, we discussed spironolactone and torsemide today.  Stopped using CPAP at home, feels better off of it.   Feels best when her BP is 160-109 systolic. Had one reading of 101/80s, felt poorly. Was sick, not eating. Highest reported is 150/70s. Uses wrist cuff.   Weighs herself daily.   ROS positive for indigestion/chest bubble, resolved. Feels like she doesn't have much appetite, feels like a lot of food doesn't sound good to her. Occasional nausea/vomiting with certain foods. No melena/hematochezia. Weight is down about 10 lbs, unintentional.  Past Medical History:  Diagnosis Date  . Abdominal pain   . Anemia    NOS  . Anxiety   . Back pain   . Bruises easily   . Chronic diastolic CHF (congestive heart failure) ([place])    a. 06/2013 EF 65-70%.  . Chronic headaches    HISTORY OF   . Depression   . Diabetes mellitus    type II  DIET CONTROLLED  . Diarrhea   . Hepatitis    AGE 30S  . Hyperlipidemia   . Hypertension   . Mild aortic stenosis    a. 06/2013 Echo: EF 65-70%, mod LVH with focal basal hypertrophy, very mild AS.  Marland Kitchen Osteoarthritis   . Oxygen desaturation during sleep    USES  2 LITERS BEDTIME   VIA CPAP 06/2012 [facility]   . Shortness of breath    WITH EXERTION USES 2 L O2 BEDTIME  . Sleep apnea    CPAP WITH O2 2 LITERS 2013 ([facility])  . Wears dentures   . Wears glasses     Past Surgical History:  Procedure Laterality Date  . ABDOMINAL HYSTERECTOMY    . APPENDECTOMY    . CHOLECYSTECTOMY    . JOINT REPLACEMENT     LEFT KNEE   . KNEE ARTHROPLASTY Right 03/22/2015   Procedure: COMPUTER ASSISTED TOTAL KNEE ARTHROPLASTY;  Surgeon: Marybelle Killings, MD;  Location: [place];  Service: Orthopedics;  Laterality: Right;  . KNEE ARTHROSCOPY     RIGHT  . LEFT AND RIGHT HEART CATHETERIZATION WITH CORONARY ANGIOGRAM N/A 10/28/2013   Procedure: LEFT AND RIGHT HEART CATHETERIZATION WITH CORONARY ANGIOGRAM;  Surgeon: Blane Ohara, MD;  Location[facility];  Service: Cardiovascular;  Laterality: N/A;  . LEFT HEART CATHETERIZATION WITH CORONARY ANGIOGRAM N/A 11/14/2014   Procedure: LEFT HEART CATHETERIZATION WITH CORONARY ANGIOGRAM;  Surgeon: Josue Hector, MD;  Location: [facility] CATH LAB;  Service: Cardiovascular;  Laterality: N/A;  . REVISION TOTAL KNEE ARTHROPLASTY  2011  . ROTATOR CUFF REPAIR  2010  . SHOULDER ARTHROSCOPY  09/17/2012   Procedure: ARTHROSCOPY SHOULDER;  Surgeon: Sharmon Revere, MD;  Location: [place];  Service: Orthopedics;  Laterality: Left;  . TONSILLECTOMY    . TONSILLECTOMY      Current Medications: Current Outpatient Medications on File Prior to Visit  Medication Sig  . albuterol (PROVENTIL) (2.5 MG/3ML) 0.083% nebulizer solution INHALE 3 MLS(2.5 MG TOTAL) BY NEBULIZATION TWICE DAILY AS NEEDED FOR WHEEZING OR SHORTNESS OF BREATH  . carvedilol (COREG) 12.5 MG tablet TAKE 1 TABLET(12.5 MG) BY MOUTH TWICE DAILY WITH A MEAL (Patient taking differently: Take 12.5 mg by mouth 2 (two) times daily with a meal.)  . Cholecalciferol 2000 units TABS Take 2 tablets (4,000 Units total) by mouth daily.  . clonazePAM (KLONOPIN) 1 MG tablet Take 1 tablet (1 mg total)  by mouth 2 (two) times daily as needed for anxiety.  . hydrALAZINE (APRESOLINE) 50 MG tablet Take 1 tablet (50 mg total) by mouth 2 (two) times daily.  Marland Kitchen HYDROcodone-acetaminophen (NORCO/VICODIN) 5-325 MG tablet Take 1 tablet by mouth every 6 (six) hours as needed.  Marland Kitchen MYRBETRIQ 50 MG TB24 tablet TAKE 1 TABLET(50 MG) BY MOUTH DAILY  . omeprazole (PRILOSEC) 40 MG capsule TAKE 1 CAPSULE(40 MG) BY MOUTH DAILY  . ondansetron (ZOFRAN) 4 MG tablet Take 1 tablet (4 mg total) by mouth every 8 (eight) hours as needed for nausea or vomiting.  Marland Kitchen spironolactone (ALDACTONE) 25 MG tablet Take 0.5 tablets (12.5 mg total) by mouth daily.  Marland Kitchen torsemide (DEMADEX) 20 MG tablet TAKE 1 TABLET(20 MG) BY MOUTH DAILY  . traZODone (DESYREL) 150 MG tablet TAKE 1 TABLET(150 MG) BY MOUTH AT BEDTIME   No current facility-administered medications on file prior to visit.     Allergies:   Amlodipine, Enalapril, Losartan potassium, Morphine and related, and Metformin   Social History   Tobacco Use  . Smoking status: Never Smoker  . Smokeless tobacco: Never Used  Vaping Use  . Vaping Use: Never used  Substance Use Topics  . Alcohol use: No  . Drug use: No    Family History: The patient's family history includes Alcohol abuse in an other family member; Cancer in her father; Emphysema in her brother; Heart disease in her brother and sister; Hypertension in an other family member; Kidney disease in an other family member; Mental illness in an other family member.  ROS:   Please see the history of present illness.  Additional pertinent ROS otherwise unremarkable  EKGs/Labs/Other Studies Reviewed:    The following studies were reviewed today: Echo 07/22/19  1. The left ventricle has normal systolic function with an ejection fraction of 60-65%. The cavity size was normal. Severe basal septal hypertrophy. Left ventricular diastolic Doppler parameters are consistent with pseudonormalization.  2. The right ventricle has  normal systolic function. The cavity was normal. There is no increase in right ventricular wall thickness. Right ventricular systolic pressure could not be assessed.  3. There is mild mitral annular calcification present.  4. The aortic valve is tricuspid. Moderate calcification of the aortic valve. Aortic valve regurgitation is trivial by color flow Doppler.  5. The aorta is normal unless otherwise noted.  6. The interatrial septum appears to be lipomatous.  EKG:  EKG is personally reviewed.  The ekg ordered 08/11/20 demonstrates sinus rhythm at 67 bpm  Recent Labs: 04/10/2020: TSH 3.85 08/11/2020: ALT 14 08/12/2020: B Natriuretic Peptide 109.4 08/13/2020: BUN 15; Creatinine, Ser 0.92; Hemoglobin 11.8; Magnesium 1.6; Platelets 137; Potassium 3.9; Sodium 138  Recent Lipid Panel  Component Value Date/Time   CHOL 186 04/10/2020 1403   TRIG 110.0 04/10/2020 1403   HDL 57.90 04/10/2020 1403   CHOLHDL 3 04/10/2020 1403   VLDL 22.0 04/10/2020 1403   LDLCALC 106 (H) 04/10/2020 1403   LDLDIRECT 121.0 06/12/2017 1349    Physical Exam:    VS:  BP 124/76   Pulse 90   Ht _0  (1.575 m)   Wt 207 lb (93.9 kg)   SpO2 94%   BMI 37.86 kg/m     Wt Readings from Last 3 Encounters:  02/06/21 207 lb (93.9 kg)  01/23/21 214 lb (97.1 kg)  09/26/20 218 lb 6.4 oz (99.1 kg)    GEN: Well nourished, well developed in no acute distress HEENT: Normal, moist mucous membranes NECK: No JVD CARDIAC: regular rhythm, normal S1 and S2, no rubs or gallops. 2/6 systolic murmur. VASCULAR: Radial and DP pulses 2+ bilaterally. No carotid bruits RESPIRATORY:  Clear to auscultation without rales, wheezing or rhonchi  ABDOMEN: Soft, non-tender, non-distended MUSCULOSKELETAL:  Ambulates independently SKIN: Warm and dry, no edema NEUROLOGIC:  Alert and oriented x 3. No focal neuro deficits noted. PSYCHIATRIC:  Normal affect   ASSESSMENT:    1. Chronic diastolic heart failure ([place])   2. Essential hypertension    3. Aortic stenosis, mild   4. Encounter for education about heart failure   5. Cardiac risk counseling   6. Counseling on health promotion and disease prevention    PLAN:    Chronic diastolic heart failure:  -appears euvolemic on exam -reviewed heart failure education, including daily weights, salt avoidance -discussed the importance of good BP control -she wants to cut back on some of her medications. We reviewed her prior issues with both edema and hypertensive urgency. She wants to stop torsemide completely, but after shared decision making we will try a short trial of every other day torsemide. Discussed that if she has shortness of breath, weight gain, or worsening LE edema, she will need to go back to daily dosing.  Hypertension: prior hospitalization for hypertensive urgency 07/2020 -continue carvedilol 12.5 mg BID. Avoid bradycardia due to diastolic dysfunction -continue spirinolactone 12.5 mg daily -continue torsemide 20 mg. She wants to try taking every other day. We will try this for 3-4 weeks, as above -continue hydralazine 50 mg BID -cannot tolerate amlodipine, ACEi/ARB, see allergies -has appt with Dr. Ronnald Ramp 03/20/21, reports she always gets labs done at that visit.  Murmur: aortic sclerosis with mild stenosis. Unchanged on exam today  Plan for follow up: 3-4 weeks to monitor response to every other day torsemide  [name], MD, PhD, [facility]   Medication Adjustments/Labs and Tests Ordered: Current medicines are reviewed at length with the patient today.  Concerns regarding medicines are outlined above.  No orders of the defined types were placed in this encounter.  No orders of the defined types were placed in this encounter.   Patient Instructions  Medication Instructions:  Take torsemide 20 mg every other day for the next few weeks. We will check in and see how this is going. If you have more shortness of breath or swelling  please call us.  *If you need a refill on your cardiac medications before your next appointment, please call your pharmacy*   Lab Work: None   Testing/Procedures: None   Follow-Up: At [facility], you and your health needs are our priority.  As part of our continuing mission to provide you with exceptional heart  care, we have created designated Provider Care Teams.  These Care Teams include your primary Cardiologist (physician) and Advanced Practice Providers (APPs -  Physician Assistants and Nurse Practitioners) who all work together to provide you with the care you need, when you need it.  We recommend signing up for the patient portal called "MyChart".  Sign up information is provided on this After Visit Summary.  MyChart is used to connect with patients for Virtual Visits (Telemedicine).  Patients are able to view lab/test results, encounter notes, upcoming appointments, etc.  Non-urgent messages can be sent to your provider as well.   To learn more about what you can do with MyChart, go to [email or web address].    Your next appointment:   3-4 week(s)  The format for your next appointment:   In Person  Provider:   [name], MD      Signed, [name], MD PhD 02/06/2021     [place]

## 2021-02-07 ENCOUNTER — Encounter: Payer: Self-pay | Admitting: Cardiology

## 2021-02-08 ENCOUNTER — Other Ambulatory Visit: Payer: Self-pay | Admitting: Internal Medicine

## 2021-02-08 ENCOUNTER — Telehealth: Payer: Self-pay | Admitting: Internal Medicine

## 2021-02-08 DIAGNOSIS — Z515 Encounter for palliative care: Secondary | ICD-10-CM

## 2021-02-08 DIAGNOSIS — M8949 Other hypertrophic osteoarthropathy, multiple sites: Secondary | ICD-10-CM

## 2021-02-08 DIAGNOSIS — M159 Polyosteoarthritis, unspecified: Secondary | ICD-10-CM

## 2021-02-08 MED ORDER — HYDROCODONE-ACETAMINOPHEN 5-325 MG PO TABS
1.0000 | ORAL_TABLET | Freq: Four times a day (QID) | ORAL | 0 refills | Status: DC | PRN
Start: 2021-02-08 — End: 2021-03-12

## 2021-02-08 NOTE — Telephone Encounter (Signed)
1.Medication Requested: HYDROcodone-acetaminophen (NORCO/VICODIN) 5-325 MG tablet    2. Pharmacy (Name, Street, City): [id number] - [place], [place] - [address]  3. On Med List: yes   4. Last Visit with PCP: 09-19-20  5. Next visit date with PCP: 03-20-21   Agent: Please be advised that RX refills may take up to 3 business days. We ask that you follow-up with your pharmacy.  

## 2021-02-09 ENCOUNTER — Other Ambulatory Visit: Payer: Self-pay | Admitting: Internal Medicine

## 2021-02-09 DIAGNOSIS — F411 Generalized anxiety disorder: Secondary | ICD-10-CM

## 2021-02-09 DIAGNOSIS — J4520 Mild intermittent asthma, uncomplicated: Secondary | ICD-10-CM

## 2021-02-11 ENCOUNTER — Other Ambulatory Visit: Payer: Self-pay | Admitting: Internal Medicine

## 2021-02-11 DIAGNOSIS — F411 Generalized anxiety disorder: Secondary | ICD-10-CM

## 2021-02-11 DIAGNOSIS — J4520 Mild intermittent asthma, uncomplicated: Secondary | ICD-10-CM

## 2021-02-11 MED ORDER — ALBUTEROL SULFATE (2.5 MG/3ML) 0.083% IN NEBU: INHALATION_SOLUTION | RESPIRATORY_TRACT | 5 refills | Status: DC

## 2021-02-11 MED ORDER — TRAZODONE HCL 150 MG PO TABS: 150.0000 mg | ORAL_TABLET | Freq: Every day | ORAL | 1 refills | Status: DC

## 2021-02-19 ENCOUNTER — Telehealth: Payer: Self-pay | Admitting: Cardiology

## 2021-02-19 MED ORDER — HYDRALAZINE HCL 50 MG PO TABS
50.0000 mg | ORAL_TABLET | Freq: Two times a day (BID) | ORAL | 1 refills | Status: DC
Start: 2021-02-19 — End: 2021-09-03

## 2021-02-19 NOTE — Telephone Encounter (Signed)
Patient states she is completely out of medication.

## 2021-02-19 NOTE — Telephone Encounter (Signed)
*  STAT* If patient is at the pharmacy, call can be transferred to refill team.   1. Which medications need to be refilled? (please list name of each medication and dose if known) hydrALAZINE (APRESOLINE) 50 MG tablet  2. Which pharmacy/location (including street and city if local pharmacy) is medication to be sent to? [facility] [place], [place] - [address] AT [place]  3. Do they need a 30 day or 90 day supply? 90 day supply

## 2021-02-25 ENCOUNTER — Other Ambulatory Visit: Payer: Self-pay | Admitting: Internal Medicine

## 2021-02-28 ENCOUNTER — Ambulatory Visit: Payer: Medicare HMO | Admitting: Cardiology

## 2021-02-28 ENCOUNTER — Encounter: Payer: Self-pay | Admitting: Cardiology

## 2021-02-28 ENCOUNTER — Other Ambulatory Visit: Payer: Self-pay

## 2021-02-28 VITALS — BP 140/72 | HR 64 | Ht 62.0 in | Wt 209.4 lb

## 2021-02-28 DIAGNOSIS — I10 Essential (primary) hypertension: Secondary | ICD-10-CM | POA: Diagnosis not present

## 2021-02-28 DIAGNOSIS — I350 Nonrheumatic aortic (valve) stenosis: Secondary | ICD-10-CM | POA: Diagnosis not present

## 2021-02-28 DIAGNOSIS — R0602 Shortness of breath: Secondary | ICD-10-CM | POA: Diagnosis not present

## 2021-02-28 DIAGNOSIS — I5032 Chronic diastolic (congestive) heart failure: Secondary | ICD-10-CM

## 2021-02-28 NOTE — Patient Instructions (Signed)
Medication Instructions:  RETURN TO 20mg  TORSEMIDE ONCE DAILY  *If you need a refill on your cardiac medications before your next appointment, please call your pharmacy*  Follow-Up: At [facility], you and your health needs are our priority.  As part of our continuing mission to provide you with exceptional heart care, we have created designated Provider Care Teams.  These Care Teams include your primary Cardiologist (physician) and Advanced Practice Providers (APPs -  Physician Assistants and Nurse Practitioners) who all work together to provide you with the care you need, when you need it.  Your next appointment:   6 month(s)  The format for your next appointment:   In Person  Provider:   Cherlynn Kaiser, MD

## 2021-02-28 NOTE — Progress Notes (Signed)
Cardiology Office Note:    Date:  02/28/2021   ID:  [name], DOB 1932-11-28, MRN [id number]  PCP:  [name], MD  Cardiologist:  [name], MD PhD  Referring MD: [name], MD   CC: follow up  History of Present Illness:    [name] is a 87 y.o. female with a hx of hypertension who is seen for follow up today. I initially met her 07/16/19 as a new consult at the request of [name], MD for the evaluation and management of aortic stenosis and concern for diastolic heart failure.  Today: Trialed every other day torsemide. Didn't make as much urine as every other day torsemide. Feels her breathing is the same, though she is somewhat conversationally dyspneic on exam. Did notice more shortness of breath when she first wakes up in the morning. Hasn't used CPAP in some time.   No longer having indigestion. Changed the foods she eats, and this has helped. Weight has been downtrending, reports stable weights at home. She is up 2 lbs based on our scale since last visit. Drinks 16 oz bottles of water, drinks about 4 per day. Discussed fluid restriction guidelines again today.  Blood pressures generally stable, rare systolic in the 621H. She cannot feel these lows but in general likes it to be 086-578 systolic as this is when she feels her best.  Denies chest pain. No PND, change in orthopnea, LE edema or unexpected weight gain. No syncope or palpitations.  Past Medical History:  Diagnosis Date  . Abdominal pain   . Anemia    NOS  . Anxiety   . Back pain   . Bruises easily   . Chronic diastolic CHF (congestive heart failure) ([place])    a. 06/2013 EF 65-70%.  . Chronic headaches    HISTORY OF   . Depression   . Diabetes mellitus    type II  DIET CONTROLLED  . Diarrhea   . Hepatitis    AGE 30S  . Hyperlipidemia   . Hypertension   . Mild aortic stenosis    a. 06/2013 Echo: EF 65-70%, mod LVH with focal basal hypertrophy, very mild AS.  Marland Kitchen  Osteoarthritis   . Oxygen desaturation during sleep    USES 2 LITERS BEDTIME   VIA CPAP 06/2012 [facility]   . Shortness of breath    WITH EXERTION USES 2 L O2 BEDTIME  . Sleep apnea    CPAP WITH O2 2 LITERS 2013 ([facility])  . Wears dentures   . Wears glasses     Past Surgical History:  Procedure Laterality Date  . ABDOMINAL HYSTERECTOMY    . APPENDECTOMY    . CHOLECYSTECTOMY    . JOINT REPLACEMENT     LEFT KNEE   . KNEE ARTHROPLASTY Right 03/22/2015   Procedure: COMPUTER ASSISTED TOTAL KNEE ARTHROPLASTY;  Surgeon: Marybelle Killings, MD;  Location: [place];  Service: Orthopedics;  Laterality: Right;  . KNEE ARTHROSCOPY     RIGHT  . LEFT AND RIGHT HEART CATHETERIZATION WITH CORONARY ANGIOGRAM N/A 10/28/2013   Procedure: LEFT AND RIGHT HEART CATHETERIZATION WITH CORONARY ANGIOGRAM;  Surgeon: Blane Ohara, MD;  Location: [facility] C[facility] LAB;  Service: Cardiovascular;  Laterality: N/A;  . LEFT HEART CATHETERIZATION WITH CORONARY ANGIOGRAM N/A 11/14/2014   Procedure: LEFT HEART CATHETERIZATION WITH CORONARY ANGIOGRAM;  Surgeon: Josue Hector, MD;  Location: [facility] CATH LAB;  Service: Cardiovascular;  Laterality: N/A;  . REVISION TOTAL KNEE  ARTHROPLASTY  2011  . ROTATOR CUFF REPAIR  2010  . SHOULDER ARTHROSCOPY  09/17/2012   Procedure: ARTHROSCOPY SHOULDER;  Surgeon: Sharmon Revere, MD;  Location: [place];  Service: Orthopedics;  Laterality: Left;  . TONSILLECTOMY    . TONSILLECTOMY      Current Medications: Current Outpatient Medications on File Prior to Visit  Medication Sig  . albuterol (PROVENTIL) (2.5 MG/3ML) 0.083% nebulizer solution INHALE 3 MLS(2.5 MG TOTAL) BY NEBULIZATION TWICE DAILY AS NEEDED FOR WHEEZING OR SHORTNESS OF BREATH  . carvedilol (COREG) 12.5 MG tablet Take 12.5 mg by mouth 2 (two) times daily with a meal.  . Cholecalciferol 2000 units TABS Take 2 tablets (4,000 Units total) by mouth daily.  . clonazePAM (KLONOPIN) 1 MG tablet Take 1 tablet (1 mg total) by mouth 2 (two) times  daily as needed for anxiety.  . hydrALAZINE (APRESOLINE) 50 MG tablet Take 1 tablet (50 mg total) by mouth 2 (two) times daily.  Marland Kitchen HYDROcodone-acetaminophen (NORCO/VICODIN) 5-325 MG tablet Take 1 tablet by mouth every 6 (six) hours as needed.  Marland Kitchen MYRBETRIQ 50 MG TB24 tablet TAKE 1 TABLET(50 MG) BY MOUTH DAILY  . omeprazole (PRILOSEC) 40 MG capsule TAKE 1 CAPSULE(40 MG) BY MOUTH DAILY  . spironolactone (ALDACTONE) 25 MG tablet Take 0.5 tablets (12.5 mg total) by mouth daily.  Marland Kitchen torsemide (DEMADEX) 20 MG tablet TAKE 1 TABLET(20 MG) BY MOUTH DAILY  . traZODone (DESYREL) 150 MG tablet Take 1 tablet (150 mg total) by mouth at bedtime.   No current facility-administered medications on file prior to visit.     Allergies:   Amlodipine, Enalapril, Losartan potassium, Morphine and related, and Metformin   Social History   Tobacco Use  . Smoking status: Never Smoker  . Smokeless tobacco: Never Used  Vaping Use  . Vaping Use: Never used  Substance Use Topics  . Alcohol use: No  . Drug use: No    Family History: The patient's family history includes Alcohol abuse in an other family member; Cancer in her father; Emphysema in her brother; Heart disease in her brother and sister; Hypertension in an other family member; Kidney disease in an other family member; Mental illness in an other family member.  ROS:   Please see the history of present illness.  Additional pertinent ROS otherwise unremarkable  EKGs/Labs/Other Studies Reviewed:    The following studies were reviewed today: Echo 07/22/19  1. The left ventricle has normal systolic function with an ejection fraction of 60-65%. The cavity size was normal. Severe basal septal hypertrophy. Left ventricular diastolic Doppler parameters are consistent with pseudonormalization.  2. The right ventricle has normal systolic function. The cavity was normal. There is no increase in right ventricular wall thickness. Right ventricular systolic pressure  could not be assessed.  3. There is mild mitral annular calcification present.  4. The aortic valve is tricuspid. Moderate calcification of the aortic valve. Aortic valve regurgitation is trivial by color flow Doppler.  5. The aorta is normal unless otherwise noted.  6. The interatrial septum appears to be lipomatous.  EKG:  EKG is personally reviewed.  The ekg ordered 08/11/20 demonstrates sinus rhythm at 67 bpm  Recent Labs: 04/10/2020: TSH 3.85 08/11/2020: ALT 14 08/12/2020: B Natriuretic Peptide 109.4 08/13/2020: BUN 15; Creatinine, Ser 0.92; Hemoglobin 11.8; Magnesium 1.6; Platelets 137; Potassium 3.9; Sodium 138  Recent Lipid Panel    Component Value Date/Time   CHOL 186 04/10/2020 1403   TRIG 110.0 04/10/2020 1403   HDL 57.90  04/10/2020 1403   CHOLHDL 3 04/10/2020 1403   VLDL 22.0 04/10/2020 1403   LDLCALC 106 (H) 04/10/2020 1403   LDLDIRECT 121.0 06/12/2017 1349    Physical Exam:    VS:  BP 140/72   Pulse 64   Ht 5' 2" (1.575 m)   Wt 209 lb 6.4 oz (95 kg)   BMI 38.30 kg/m    Wt Readings from Last 3 Encounters:  02/28/21 209 lb 6.4 oz (95 kg)  02/06/21 207 lb (93.9 kg)  01/23/21 214 lb (97.1 kg)    GEN: Well nourished, well developed in no acute distress. Is mildly conversationally dyspneic with extended talking HEENT: Normal, moist mucous membranes NECK: No JVD CARDIAC: regular rhythm, normal S1 and S2, no rubs or gallops. 2/6 systolic murmur. VASCULAR: Radial and DP pulses 2+ bilaterally. No carotid bruits RESPIRATORY:  Clear to auscultation without rales, wheezing or rhonchi  ABDOMEN: Soft, non-tender, non-distended MUSCULOSKELETAL:  Ambulates independently SKIN: Warm and dry, no edema NEUROLOGIC:  Alert and oriented x 3. No focal neuro deficits noted. PSYCHIATRIC:  Normal affect   ASSESSMENT:    1. Chronic diastolic heart failure ([place])   2. Essential hypertension   3. Aortic stenosis, mild   4. SOB (shortness of breath)    PLAN:    Chronic diastolic  heart failure:  -appears euvolemic on exam, though slightly conversationally dyspneic today -reviewed heart failure education, including daily weights, salt avoidance, fluid restrictions -discussed the importance of good BP control -returning to daily dosing of torsemide today as she appears more dyspneic today, though exam unremarkable.  Hypertension: prior hospitalization for hypertensive urgency 07/2020 -continue carvedilol 12.5 mg BID. Avoid bradycardia due to diastolic dysfunction -continue spirinolactone 12.5 mg daily -continue torsemide 20 mg, return to daily dosing -continue hydralazine 50 mg BID -cannot tolerate amlodipine, ACEi/ARB, see allergies -has appt with Dr. Ronnald Ramp 03/20/21, reports she always gets labs done at that visit.  Murmur: aortic sclerosis with mild stenosis. Unchanged on exam today  Plan for follow up: 6 mos or sooner as neded  [name], MD, PhD, [facility]   Medication Adjustments/Labs and Tests Ordered: Current medicines are reviewed at length with the patient today.  Concerns regarding medicines are outlined above.  No orders of the defined types were placed in this encounter.  No orders of the defined types were placed in this encounter.   Patient Instructions  Medication Instructions:  RETURN TO 25m TORSEMIDE ONCE DAILY  *If you need a refill on your cardiac medications before your next appointment, please call your pharmacy*  Follow-Up: [facility] and your [facility] needs are our [facility].  As part of our continuing mission to provide you with exceptional heart care, we have created designated Provider Care Teams.  These Care Teams include your primary Cardiologist (physician) and Advanced Practice Providers (APPs -  Physician Assistants and Nurse Practitioners) who all work together to provide you with the care you need, when you need it.  Your next appointment:   6 month(s)  The format for your next  appointment:   In Person  Provider:   GCherlynn Kaiser MD    Signed, BBuford Dresser MD PhD 02/28/2021     [place]

## 2021-03-02 ENCOUNTER — Telehealth: Payer: Self-pay | Admitting: Internal Medicine

## 2021-03-02 NOTE — Telephone Encounter (Signed)
PMP done; prescription written 02/08/21 and filled 02/11/21. Please advise

## 2021-03-02 NOTE — Telephone Encounter (Signed)
1.Medication Requested: HYDROcodone-acetaminophen (NORCO/VICODIN) 5-325 MG tablet    2. Pharmacy (Name, Street, [place]): [facility] [phone] - [place], [place] - [address] AT [address]  3. On Med List: yes   4. Last Visit with PCP: 09-19-20  5. Next visit date with PCP: 03-20-21   Agent: Please be advised that RX refills may take up to 3 business days. We ask that you follow-up with your pharmacy.

## 2021-03-05 ENCOUNTER — Other Ambulatory Visit: Payer: Self-pay | Admitting: Internal Medicine

## 2021-03-12 ENCOUNTER — Other Ambulatory Visit: Payer: Self-pay | Admitting: Internal Medicine

## 2021-03-12 DIAGNOSIS — M8949 Other hypertrophic osteoarthropathy, multiple sites: Secondary | ICD-10-CM

## 2021-03-12 DIAGNOSIS — M159 Polyosteoarthritis, unspecified: Secondary | ICD-10-CM

## 2021-03-12 DIAGNOSIS — Z515 Encounter for palliative care: Secondary | ICD-10-CM

## 2021-03-12 MED ORDER — HYDROCODONE-ACETAMINOPHEN 5-325 MG PO TABS: 1.0000 | ORAL_TABLET | Freq: Four times a day (QID) | ORAL | 0 refills | Status: DC | PRN

## 2021-03-12 NOTE — Telephone Encounter (Signed)
Patient is requesting a refill for HYDROcodone-acetaminophen (NORCO/VICODIN) 5-325 MG tablet. It can be sent to [place], [place] AT [place]

## 2021-03-12 NOTE — Telephone Encounter (Signed)
Does pt need to wait for refill at upcoming OV?

## 2021-03-20 ENCOUNTER — Ambulatory Visit (INDEPENDENT_AMBULATORY_CARE_PROVIDER_SITE_OTHER): Payer: Medicare HMO | Admitting: Internal Medicine

## 2021-03-20 ENCOUNTER — Encounter: Payer: Self-pay | Admitting: Internal Medicine

## 2021-03-20 ENCOUNTER — Other Ambulatory Visit: Payer: Self-pay

## 2021-03-20 VITALS — BP 134/68 | HR 68 | Temp 98.2°F | Resp 16 | Ht 62.0 in | Wt 204.0 lb

## 2021-03-20 DIAGNOSIS — D696 Thrombocytopenia, unspecified: Secondary | ICD-10-CM | POA: Diagnosis not present

## 2021-03-20 DIAGNOSIS — K219 Gastro-esophageal reflux disease without esophagitis: Secondary | ICD-10-CM

## 2021-03-20 DIAGNOSIS — N3281 Overactive bladder: Secondary | ICD-10-CM | POA: Diagnosis not present

## 2021-03-20 DIAGNOSIS — D539 Nutritional anemia, unspecified: Secondary | ICD-10-CM

## 2021-03-20 DIAGNOSIS — I10 Essential (primary) hypertension: Secondary | ICD-10-CM

## 2021-03-20 LAB — CBC WITH DIFFERENTIAL/PLATELET
Basophils Absolute: 0 10*3/uL (ref 0.0–0.1)
Basophils Relative: 0.5 % (ref 0.0–3.0)
Eosinophils Absolute: 0.3 10*3/uL (ref 0.0–0.7)
Eosinophils Relative: 2.7 % (ref 0.0–5.0)
HCT: 39.8 % (ref 36.0–46.0)
Hemoglobin: 12.9 g/dL (ref 12.0–15.0)
Lymphocytes Relative: 24.2 % (ref 12.0–46.0)
Lymphs Abs: 2.6 10*3/uL (ref 0.7–4.0)
MCHC: 32.4 g/dL (ref 30.0–36.0)
MCV: 90.5 fl (ref 78.0–100.0)
Monocytes Absolute: 0.6 10*3/uL (ref 0.1–1.0)
Monocytes Relative: 5.9 % (ref 3.0–12.0)
Neutro Abs: 7.1 10*3/uL (ref 1.4–7.7)
Neutrophils Relative %: 66.7 % (ref 43.0–77.0)
Platelets: 154 10*3/uL (ref 150.0–400.0)
RBC: 4.39 Mil/uL (ref 3.87–5.11)
RDW: 14.6 % (ref 11.5–15.5)
WBC: 10.7 10*3/uL — ABNORMAL HIGH (ref 4.0–10.5)

## 2021-03-20 LAB — BASIC METABOLIC PANEL
BUN: 19 mg/dL (ref 6–23)
CO2: 33 mEq/L — ABNORMAL HIGH (ref 19–32)
Calcium: 9.7 mg/dL (ref 8.4–10.5)
Chloride: 101 mEq/L (ref 96–112)
Creatinine, Ser: 0.81 mg/dL (ref 0.40–1.20)
GFR: 65.88 mL/min (ref >60.00)
Glucose, Bld: 84 mg/dL (ref 70–99)
Potassium: 4.2 mEq/L (ref 3.5–5.1)
Sodium: 139 mEq/L (ref 135–145)

## 2021-03-20 LAB — FOLATE: Folate: 19.5 ng/mL (ref >5.9)

## 2021-03-20 LAB — VITAMIN B12: Vitamin B-12: 258 pg/mL (ref 211–911)

## 2021-03-20 MED ORDER — ONDANSETRON HCL 4 MG PO TABS: 4.0000 mg | ORAL_TABLET | Freq: Three times a day (TID) | ORAL | 3 refills | Status: DC | PRN

## 2021-03-20 NOTE — Patient Instructions (Signed)
[place] medicine (25th ed., pp. [phone]). [place], [place]: Elsevier.">  Anemia  Anemia is a condition in which there is not enough red blood cells or hemoglobin in the blood. Hemoglobin is a substance in red blood cells that carries oxygen. When you do not have enough red blood cells or hemoglobin (are anemic), your body cannot get enough oxygen and your organs may not work properly. As a result, you may feel very tired or have other problems. What are the causes? Common causes of anemia include:  Excessive bleeding. Anemia can be caused by excessive bleeding inside or outside the body, including bleeding from the intestines or from heavy menstrual periods in females.  Poor nutrition.  Long-lasting (chronic) kidney, thyroid, and liver disease.  Bone marrow disorders, spleen problems, and blood disorders.  Cancer and treatments for cancer.  HIV (human immunodeficiency virus) and AIDS (acquired immunodeficiency syndrome).  Infections, medicines, and autoimmune disorders that destroy red blood cells. What are the signs or symptoms? Symptoms of this condition include:  Minor weakness.  Dizziness.  Headache, or difficulties concentrating and sleeping.  Heartbeats that feel irregular or faster than normal (palpitations).  Shortness of breath, especially with exercise.  Pale skin, lips, and nails, or cold hands and feet.  Indigestion and nausea. Symptoms may occur suddenly or develop slowly. If your anemia is mild, you may not have symptoms. How is this diagnosed? This condition is diagnosed based on blood tests, your medical history, and a physical exam. In some cases, a test may be needed in which cells are removed from the soft tissue inside of a bone and looked at under a microscope (bone marrow biopsy). Your health care provider may also check your stool (feces) for blood and may do additional testing to look for the cause of your bleeding. Other tests may  include:  Imaging tests, such as a CT scan or MRI.  A procedure to see inside your esophagus and stomach (endoscopy).  A procedure to see inside your colon and rectum (colonoscopy). How is this treated? Treatment for this condition depends on the cause. If you continue to lose a lot of blood, you may need to be treated at a hospital. Treatment may include:  Taking supplements of iron, vitamin Q68, or folic acid.  Taking a hormone medicine (erythropoietin) that can help to stimulate red blood cell growth.  Having a blood transfusion. This may be needed if you lose a lot of blood.  Making changes to your diet.  Having surgery to remove your spleen. Follow these instructions at home:  Take over-the-counter and prescription medicines only as told by your health care provider.  Take supplements only as told by your health care provider.  Follow any diet instructions that you were given by your health care provider.  Keep all follow-up visits as told by your health care provider. This is important. Contact a health care provider if:  You develop new bleeding anywhere in the body. Get help right away if:  You are very weak.  You are short of breath.  You have pain in your abdomen or chest.  You are dizzy or feel faint.  You have trouble concentrating.  You have bloody stools, black stools, or tarry stools.  You vomit repeatedly or you vomit up blood. These symptoms may represent a serious problem that is an emergency. Do not wait to see if the symptoms will go away. Get medical help right away. Call your local emergency services (911 in the [place] Do not  drive yourself to the hospital. Summary  Anemia is a condition in which you do not have enough red blood cells or enough of a substance in your red blood cells that carries oxygen (hemoglobin).  Symptoms may occur suddenly or develop slowly.  If your anemia is mild, you may not have symptoms.  This condition is  diagnosed with blood tests, a medical history, and a physical exam. Other tests may be needed.  Treatment for this condition depends on the cause of the anemia. This information is not intended to replace advice given to you by your health care provider. Make sure you discuss any questions you have with your health care provider. Document Revised: 10/19/2019 Document Reviewed: 10/19/2019 Elsevier Patient Education  2021 [employer].  

## 2021-03-20 NOTE — Progress Notes (Signed)
Subjective:  Patient ID: [name], female    DOB: 1933/09/09  Age: 87 y.o. MRN: [id number]  CC: Anemia and Hypertension  This visit occurred during the SARS-CoV-2 public health emergency.  Safety protocols were in place, including screening questions prior to the visit, additional usage of staff PPE, and extensive cleaning of exam room while observing appropriate contact time as indicated for disinfecting solutions.    HPI Diem MIYU FENDERSON presents for f/up - She complains of intermittent heartburn and nausea, she wants to take a medication for nausea.  Outpatient Medications Prior to Visit  Medication Sig Dispense Refill  . albuterol (PROVENTIL) (2.5 MG/3ML) 0.083% nebulizer solution INHALE 3 MLS(2.5 MG TOTAL) BY NEBULIZATION TWICE DAILY AS NEEDED FOR WHEEZING OR SHORTNESS OF BREATH 75 mL 5  . carvedilol (COREG) 12.5 MG tablet Take 12.5 mg by mouth 2 (two) times daily with a meal.    . Cholecalciferol 2000 units TABS Take 2 tablets (4,000 Units total) by mouth daily. 180 tablet 1  . clonazePAM (KLONOPIN) 1 MG tablet Take 1 tablet (1 mg total) by mouth 2 (two) times daily as needed for anxiety. 60 tablet 5  . hydrALAZINE (APRESOLINE) 50 MG tablet Take 1 tablet (50 mg total) by mouth 2 (two) times daily. 180 tablet 1  . HYDROcodone-acetaminophen (NORCO/VICODIN) 5-325 MG tablet Take 1 tablet by mouth every 6 (six) hours as needed. 90 tablet 0  . MYRBETRIQ 50 MG TB24 tablet TAKE 1 TABLET(50 MG) BY MOUTH DAILY 90 tablet 1  . omeprazole (PRILOSEC) 40 MG capsule TAKE 1 CAPSULE(40 MG) BY MOUTH DAILY 90 capsule 1  . spironolactone (ALDACTONE) 25 MG tablet Take 0.5 tablets (12.5 mg total) by mouth daily. 30 tablet 1  . torsemide (DEMADEX) 20 MG tablet TAKE 1 TABLET(20 MG) BY MOUTH DAILY 90 tablet 0  . traZODone (DESYREL) 150 MG tablet Take 1 tablet (150 mg total) by mouth at bedtime. 90 tablet 1   No facility-administered medications prior to visit.    ROS Review of Systems  Constitutional:  Negative for diaphoresis and fatigue.  HENT: Negative.  Negative for trouble swallowing and voice change.   Eyes: Negative.   Respiratory: Negative for cough, chest tightness, shortness of breath and wheezing.   Cardiovascular: Negative for chest pain, palpitations and leg swelling.  Gastrointestinal: Positive for nausea. Negative for abdominal pain, constipation, diarrhea and vomiting.  Endocrine: Positive for polyuria.  Genitourinary: Positive for frequency. Negative for difficulty urinating, dysuria, hematuria and urgency.  Musculoskeletal: Positive for arthralgias. Negative for back pain and myalgias.  Skin: Negative for color change.  Neurological: Negative.  Negative for dizziness, weakness, light-headedness and headaches.  Hematological: Negative for adenopathy. Does not bruise/bleed easily.  Psychiatric/Behavioral: Negative.     Objective:  BP 134/68 (BP Location: Left Arm, Patient Position: Sitting, Cuff Size: Large)   Pulse 68   Temp 98.2 F (36.8 C) (Oral)   Resp 16   Ht 5\' 2"  (1.575 m)   Wt 204 lb (92.5 kg)   SpO2 98%   BMI 37.31 kg/m   BP Readings from Last 3 Encounters:  03/20/21 134/68  02/28/21 140/72  02/06/21 124/76    Wt Readings from Last 3 Encounters:  03/20/21 204 lb (92.5 kg)  02/28/21 209 lb 6.4 oz (95 kg)  02/06/21 207 lb (93.9 kg)    Physical Exam Vitals reviewed.  Constitutional:      Appearance: She is not ill-appearing.  HENT:     Nose: Nose normal.  Mouth/Throat:     Mouth: Mucous membranes are moist.  Eyes:     General: No scleral icterus.    Conjunctiva/sclera: Conjunctivae normal.  Cardiovascular:     Rate and Rhythm: Normal rate and regular rhythm.     Heart sounds: Murmur heard.    Pulmonary:     Effort: Pulmonary effort is normal.     Breath sounds: No stridor. No wheezing, rhonchi or rales.  Abdominal:     General: Abdomen is protuberant. Bowel sounds are normal. There is no distension.     Palpations: Abdomen is  soft. There is no hepatomegaly, splenomegaly or mass.     Tenderness: There is no abdominal tenderness.  Musculoskeletal:        General: Normal range of motion.     Cervical back: Neck supple.     Right lower leg: No edema.     Left lower leg: No edema.  Lymphadenopathy:     Cervical: No cervical adenopathy.  Skin:    General: Skin is warm and dry.     Coloration: Skin is not pale.  Neurological:     General: No focal deficit present.     Mental Status: She is alert.  Psychiatric:        Mood and Affect: Mood normal.        Behavior: Behavior normal.     Lab Results  Component Value Date   WBC 10.7 (H) 03/20/2021   HGB 12.9 03/20/2021   HCT 39.8 03/20/2021   PLT 154.0 03/20/2021   GLUCOSE 84 03/20/2021   CHOL 186 04/10/2020   TRIG 110.0 04/10/2020   HDL 57.90 04/10/2020   LDLDIRECT 121.0 06/12/2017   LDLCALC 106 (H) 04/10/2020   ALT 14 08/11/2020   AST 21 08/11/2020   NA 139 03/20/2021   K 4.2 03/20/2021   CL 101 03/20/2021   CREATININE 0.81 03/20/2021   BUN 19 03/20/2021   CO2 33 (H) 03/20/2021   TSH 3.85 04/10/2020   INR 1.0 08/11/2020   HGBA1C 5.0 08/26/2018    MR BRAIN WO CONTRAST  Result Date: 08/11/2020 CLINICAL DATA:  87 year old female code stroke presentation. Hypertensive, dizziness, left arm numbness. EXAM: MRI HEAD WITHOUT CONTRAST TECHNIQUE: Multiplanar, multiecho pulse sequences of the brain and surrounding structures were obtained without intravenous contrast. COMPARISON:  Head CT earlier tonight.  Brain MRI 08/26/2018. FINDINGS: Brain: No restricted diffusion to suggest acute infarction. No midline shift, mass effect, evidence of mass lesion, ventriculomegaly, extra-axial collection or acute intracranial hemorrhage. Cervicomedullary junction and pituitary are within normal limits. Stable small chronic infarcts in the bilateral cerebellum, right thalamus. Patchy bilateral cerebral white matter T2 and FLAIR hyperintensity has not significantly changed  since 2019. No cortical encephalomalacia or chronic cerebral blood products identified. Vascular: Major intracranial vascular flow voids are stable since 2019. Skull and upper cervical spine: Chronic cervical spine degeneration and evidence of C3-C4 spinal stenosis on series 4, image 12. Conspicuous new decreased T1 marrow signal at the anterior vertebral bodies there (series 4, image 12) since 2019. Otherwise stable visible cervical levels, with normal prevertebral and paraspinal soft tissues. Visible bone marrow signal elsewhere appears stable and within normal limits. Sinuses/Orbits: Stable. Chronic right sphenoid sinusitis. Chronic right maxillary retention cysts. Other: Mastoids remain clear. Visible internal auditory structures appear normal. Scalp and face appear negative. IMPRESSION: 1. No acute intracranial abnormality. Stable chronic small vessel disease since 2019. 2. Unusual appearance of altered bone marrow signal limited to the ventral C3 and C4 vertebral bodies,  new since 2019. This is of unclear etiology and significance. Would recommend ensuring the patient is up-to-date on appropriate cancer screening. And otherwise consider follow-up Cervical Spine CT or MRI to attempt further imaging characterization. Electronically Signed   By: Genevie Ann M.D.   On: 08/11/2020 23:26   DG CHEST PORT 1 VIEW  Result Date: 08/12/2020 CLINICAL DATA:  Pneumonia EXAM: PORTABLE CHEST 1 VIEW COMPARISON:  August 11, 2020 FINDINGS: Evaluation is limited secondary to patient rotation the cardiomediastinal silhouette is unchanged in contour. No pleural effusion. No pneumothorax. Scattered bibasilar heterogeneous opacities. Previously described RIGHT lower lung heterogeneous opacity is obscured secondary to patient rotation. Visualized abdomen is unremarkable. Multilevel degenerative changes of the thoracic spine. IMPRESSION: Scattered bibasilar heterogeneous opacities, with differential considerations including  atelectasis versus infection. Previously described RIGHT lower lung heterogeneous opacity is obscured secondary to patient rotation. Electronically Signed   By: Valentino Saxon MD   On: 08/12/2020 09:08   DG Chest Port 1 View  Result Date: 08/11/2020 CLINICAL DATA:  Shortness of breath. EXAM: PORTABLE CHEST 1 VIEW COMPARISON:  May 24, 2020 FINDINGS: Subtle opacity in the right base. The heart, hila, mediastinum, pleura, and lungs are otherwise unremarkable. IMPRESSION: Subtle infiltrate in the right base worrisome for pneumonia. Recommend short-term follow-up imaging to ensure resolution. Electronically Signed   By: Dorise Bullion III M.D   On: 08/11/2020 20:14   CT HEAD CODE STROKE WO CONTRAST  Result Date: 08/11/2020 CLINICAL DATA:  Code stroke. 87 year old female hypertensive, dizziness, left arm numbness. EXAM: CT HEAD WITHOUT CONTRAST TECHNIQUE: Contiguous axial images were obtained from the base of the skull through the vertex without intravenous contrast. COMPARISON:  Head CT 08/25/2018, brain MRI 08/26/2018. FINDINGS: Brain: Stable cerebral volume. No midline shift, mass effect, or evidence of intracranial mass lesion. No acute intracranial hemorrhage identified. No ventriculomegaly. Patchy bilateral white matter hypodensity has not significantly changed. Chronic lacunar infarct of the right thalamus is stable. Small chronic right cerebellar infarct is stable. No cortically based acute infarct identified. Vascular: Calcified atherosclerosis at the skull base. No suspicious intracranial vascular hyperdensity. Skull: No acute osseous abnormality identified. Sinuses/Orbits: Chronic right sphenoid sinusitis is stable. Tympanic cavities and mastoids remain clear. Other: No acute orbit or scalp soft tissue findings. ASPECTS [facility] Stroke Program Early CT Score) Total score (0-10 with 10 being normal): 10 IMPRESSION: 1. No acute cortically based infarct or acute intracranial hemorrhage identified.  ASPECTS 10. 2. Stable non contrast CT appearance of chronic small vessel disease. 3. These results were communicated to Dr. Lorraine Lax at 8:06 pm on 08/11/2020 by text page via the [facility] messaging system. Electronically Signed   By: Genevie Ann M.D.   On: 08/11/2020 20:06    Assessment & Plan:   Zamara was seen today for anemia and hypertension.  Diagnoses and all orders for this visit:  Thrombocytopenia ([place])- Her PLT count is normal now. -     CBC with Differential/Platelet; Future -     Folate; Future -     Vitamin B12; Future -     Vitamin B12 -     Folate -     CBC with Differential/Platelet  Deficiency anemia- Her H/H are normal now. -     CBC with Differential/Platelet; Future -     Folate; Future -     Vitamin B12; Future -     Vitamin B12 -     Folate -     CBC with Differential/Platelet  OAB (overactive bladder) -  Ambulatory referral to Urology  Essential hypertension- Her BP is well controlled. -     Basic metabolic panel; Future -     Basic metabolic panel  Gastroesophageal reflux disease without esophagitis -     ondansetron (ZOFRAN) 4 MG tablet; Take 1 tablet (4 mg total) by mouth every 8 (eight) hours as needed for nausea or vomiting.   I am having Jennika H. Ryle start on ondansetron. I am also having her maintain her Cholecalciferol, spironolactone, clonazePAM, omeprazole, torsemide, albuterol, traZODone, hydrALAZINE, Myrbetriq, carvedilol, and HYDROcodone-acetaminophen.  Meds ordered this encounter  Medications  . ondansetron (ZOFRAN) 4 MG tablet    Sig: Take 1 tablet (4 mg total) by mouth every 8 (eight) hours as needed for nausea or vomiting.    Dispense:  40 tablet    Refill:  3     Follow-up: Return in about 6 months (around 09/19/2021).  Scarlette Calico, MD

## 2021-03-26 ENCOUNTER — Telehealth: Payer: Self-pay | Admitting: Pharmacist

## 2021-03-26 NOTE — Progress Notes (Signed)
Chronic Care Management Pharmacy Assistant   Name: [name]  MRN: [id number] DOB: 1933-08-20    Reason for Encounter: Hypertension Disease State Call   Conditions to be addressed/monitored: HTN   Recent office visits:  11/30/20 Dr. Sharlet Salina amoxicillin [id number] mg, ondansetron HCI 4 mg 03/20/21 Dr. Scarlette Calico  Recent consult visits:  02/06/21, 02/28/21 Dr. [name] Cardiology  [facility] visits:  None in previous 6 months  Medications: Outpatient Encounter Medications as of 03/26/2021  Medication Sig  . albuterol (PROVENTIL) (2.5 MG/3ML) 0.083% nebulizer solution INHALE 3 MLS(2.5 MG TOTAL) BY NEBULIZATION TWICE DAILY AS NEEDED FOR WHEEZING OR SHORTNESS OF BREATH  . carvedilol (COREG) 12.5 MG tablet Take 12.5 mg by mouth 2 (two) times daily with a meal.  . Cholecalciferol 2000 units TABS Take 2 tablets (4,000 Units total) by mouth daily.  . clonazePAM (KLONOPIN) 1 MG tablet Take 1 tablet (1 mg total) by mouth 2 (two) times daily as needed for anxiety.  . hydrALAZINE (APRESOLINE) 50 MG tablet Take 1 tablet (50 mg total) by mouth 2 (two) times daily.  Marland Kitchen HYDROcodone-acetaminophen (NORCO/VICODIN) 5-325 MG tablet Take 1 tablet by mouth every 6 (six) hours as needed.  Marland Kitchen MYRBETRIQ 50 MG TB24 tablet TAKE 1 TABLET(50 MG) BY MOUTH DAILY  . omeprazole (PRILOSEC) 40 MG capsule TAKE 1 CAPSULE(40 MG) BY MOUTH DAILY  . ondansetron (ZOFRAN) 4 MG tablet Take 1 tablet (4 mg total) by mouth every 8 (eight) hours as needed for nausea or vomiting.  Marland Kitchen spironolactone (ALDACTONE) 25 MG tablet Take 0.5 tablets (12.5 mg total) by mouth daily.  Marland Kitchen torsemide (DEMADEX) 20 MG tablet TAKE 1 TABLET(20 MG) BY MOUTH DAILY  . traZODone (DESYREL) 150 MG tablet Take 1 tablet (150 mg total) by mouth at bedtime.   No facility-administered encounter medications on file as of 03/26/2021.    Reviewed chart prior to disease state call. Spoke with patient regarding BP  Recent Office Vitals: BP Readings from  Last 3 Encounters:  03/20/21 134/68  02/28/21 140/72  02/06/21 124/76   Pulse Readings from Last 3 Encounters:  03/20/21 68  02/28/21 64  02/06/21 90    Wt Readings from Last 3 Encounters:  03/20/21 204 lb (92.5 kg)  02/28/21 209 lb 6.4 oz (95 kg)  02/06/21 207 lb (93.9 kg)     Kidney Function Lab Results  Component Value Date/Time   CREATININE 0.81 03/20/2021 01:44 PM   CREATININE 0.92 08/13/2020 05:47 AM   GFR 65.88 03/20/2021 01:44 PM   GFRNONAA 57 (L) 08/13/2020 05:47 AM   GFRAA >60 08/13/2020 05:47 AM    BMP Latest Ref Rng & Units 03/20/2021 08/13/2020 08/12/2020  Glucose 70 - 99 mg/dL 84 109(H) 111(H)  BUN 6 - 23 mg/dL 19 15 10   Creatinine 0.40 - 1.20 mg/dL 0.81 0.92 0.87  BUN/Creat Ratio 12 - 28 - - -  Sodium 135 - 145 mEq/L 139 138 141  Potassium 3.5 - 5.1 mEq/L 4.2 3.9 4.4  Chloride 96 - 112 mEq/L 101 102 105  CO2 19 - 32 mEq/L 33(H) 28 26  Calcium 8.4 - 10.5 mg/dL 9.7 9.6 9.6    . Current antihypertensive regimen:  Hydralazine 50 mg 1 tab two times daily, spironolactone 25 mg 0.5 mg daily, and torsemide 20 mg 1 tab daily  . How often are you checking your Blood Pressure? Patient states that she checks blood pressure daily with wrist cuff  . Current home BP readings: Patient blood pressure today was  132/73 after breakfast  . What recent interventions/DTPs have been made by any provider to improve Blood Pressure control since last CPP Visit: None noted  . Any recent hospitalizations or ED visits since last visit with CPP? None ID  . What diet changes have been made to improve Blood Pressure Control?  Patient states that she has made some changes to diet like eating more vegetables and fruit, watching her salt intake  . What exercise is being done to improve your Blood Pressure Control?  Patient states that she has not made any changes to exercise, she has to walk with a  Walker to help her get around because she is afraid that she may fall  Adherence  Review: Is the patient currently on ACE/ARB medication? None ID Does the patient have >5 day gap between last estimated fill dates? No   Star Rating Drugs: None ID  Ethelene Hal Clinical Pharmacist Assistant [phone]  Time spent:41

## 2021-03-28 ENCOUNTER — Other Ambulatory Visit: Payer: Self-pay

## 2021-03-28 NOTE — Patient Outreach (Signed)
[facility]) Care Management  03/28/2021  [name] 02/02/1933 [id number]   Telephone Assessment Annual Assessment     Successful outreach to patient. She denies any acute issues or concerns  at present. Patient reports she has bene doing fairly well. She has seen both PCP and cardiologist recently. Patient shares that she has decided to  get mammogram due to her age and does not want to have anything done if she was to have breast CA. She reports she has discussed this with her MD's as well. She continues to reside in her home alone. She has supportive family that assist her as needed. Son was in the home during this call. No recent falls-patient using walker. She continues to be fairly independent with ADLs and IADLs. She denies any RN CM needs or concerns at this time.    Medications Reviewed Today    Reviewed by Hayden Pedro, RN (Registered Nurse) on 03/28/21 at [place] List Status: <None>  Medication Order Taking? Sig Documenting Provider Last Dose Status Informant  albuterol (PROVENTIL) (2.5 MG/3ML) 0.083% nebulizer solution [id number] No INHALE 3 MLS(2.5 MG TOTAL) BY NEBULIZATION TWICE DAILY AS NEEDED FOR WHEEZING OR SHORTNESS OF Cherly Beach, MD Taking Active   carvedilol (COREG) 12.5 MG tablet [id number] No Take 12.5 mg by mouth 2 (two) times daily with a meal. [provider] Taking Active   Cholecalciferol 2000 units TABS [id number] No Take 2 tablets (4,000 Units total) by mouth daily. [name], MD Taking Active Multiple Informants  clonazePAM (KLONOPIN) 1 MG tablet [id number] No Take 1 tablet (1 mg total) by mouth 2 (two) times daily as needed for anxiety. [name], MD Taking Active   hydrALAZINE (APRESOLINE) 50 MG tablet [id number] No Take 1 tablet (50 mg total) by mouth 2 (two) times daily. [name], MD Taking Active   HYDROcodone-acetaminophen (NORCO/VICODIN) 5-325 MG tablet [id number] No Take 1 tablet  by mouth every 6 (six) hours as needed. [name], MD Taking Active   MYRBETRIQ 50 MG TB24 tablet [id number] No TAKE 1 TABLET(50 MG) BY MOUTH DAILY [name], MD Taking Active   omeprazole (PRILOSEC) 40 MG capsule [id number] No TAKE 1 CAPSULE(40 MG) BY MOUTH DAILY [name], MD Taking Active   ondansetron (ZOFRAN) 4 MG tablet [id number]  Take 1 tablet (4 mg total) by mouth every 8 (eight) hours as needed for nausea or vomiting. [name], MD  Active   spironolactone (ALDACTONE) 25 MG tablet [id number] No Take 0.5 tablets (12.5 mg total) by mouth daily. Shawna Clamp, MD Taking Active   torsemide (DEMADEX) 20 MG tablet [id number] No TAKE 1 TABLET(20 MG) BY MOUTH DAILY [name], MD Taking Active   traZODone (DESYREL) 150 MG tablet [id number] No Take 1 tablet (150 mg total) by mouth at bedtime. [name], MD Taking Active           Fall Risk  03/28/2021 03/20/2021 01/23/2021 12/26/2020 09/04/2020  Falls in the past year? 1 1 1  0 0  Number falls in past yr: 0 0 0 0 0  Injury with Fall? 1 1 1  0 0  Comment - bruises - - -  Risk Factor Category  - - - - -  Risk for fall due to : Impaired balance/gait;Medication side effect - Impaired balance/gait;Orthopedic patient Impaired balance/gait;Impaired mobility;Medication side effect Impaired balance/gait;Medication side effect;Impaired mobility;Impaired vision  Follow up Falls evaluation completed;Education provided - - Falls evaluation completed;Education  provided Falls evaluation completed;Education provided   Depression screen [facility] 2/9 03/28/2021 03/20/2021 01/23/2021 04/25/2020 09/06/2019  Decreased Interest 0 0 0 0 0  Down, Depressed, Hopeless 0 0 0 0 0  PHQ - 2 Score 0 0 0 0 0  Altered sleeping - - - - -  Tired, decreased energy - - - - -  Change in appetite - - - - -  Feeling bad or failure about yourself  - - - - -  Trouble concentrating - - - - -  Moving slowly or fidgety/restless - - - - -  Suicidal thoughts - - - - -   PHQ-9 Score - - - - -  Difficult doing work/chores - - - - -  Some recent data might be hidden   SDOH Screenings   Alcohol Screen: Low Risk   . Last Alcohol Screening Score (AUDIT): 0  Depression (PHQ2-9): Low Risk   . PHQ-2 Score: 0  Financial Resource Strain: Low Risk   . Difficulty of Paying Living Expenses: Not hard at all  Food Insecurity: No Food Insecurity  . Worried About Charity fundraiser in the Last Year: Never true  . Ran Out of Food in the Last Year: Never true  Housing: Low Risk   . Last Housing Risk Score: 0  Physical Activity: Sufficiently Active  . Days of Exercise per Week: 5 days  . Minutes of Exercise per Session: 30 min  Social Connections: Moderately Integrated  . Frequency of Communication with Friends and Family: More than three times a week  . Frequency of Social Gatherings with Friends and Family: More than three times a week  . Attends Religious Services: More than 4 times per year  . Active Member of Clubs or Organizations: No  . Attends Archivist Meetings: More than 4 times per year  . Marital Status: Widowed  Stress: No Stress Concern Present  . Feeling of Stress : Not at all  Tobacco Use: Low Risk   . Smoking Tobacco Use: Never Smoker  . Smokeless Tobacco Use: Never Used  Transportation Needs: No Transportation Needs  . Lack of Transportation (Medical): No  . Lack of Transportation (Non-Medical): No   Goals Addressed              This Visit's Progress   .  COMPLETED: (THN)Make and Keep All Appointments (pt-stated)        Timeframe:  Long-Range Goal Priority:  High Start Date:  12/26/2020                           Expected End Date:   May 2022                    Follow Up Date May 2022 -Make an appt to get COVID-19 booster shot   Why is this important?    Part of staying healthy is seeing the doctor for follow-up care.   If you forget your appointments, there are some things you can do to stay on track.    Notes:   01/15/21-Patient past due for booster shot. RN CM educated pt on importance of getting vaccinated and provided info on how to obtain one. Patient states she only wanted to go to a [facility] vaccine clinic and not use local pharmacies.    03/28/21-Patient completed booster vaccine.    .  (THN)Track and Manage Fluids and Swelling (pt-stated)  Timeframe:  Long-Range Goal Priority:  High Start Date: 09/04/20                       Expected End Date:  July 2022  Follow Up Date July 2022   - call office if I gain more than 2 pounds in one day or 5 pounds in one week - keep legs up while sitting - use salt in moderation - watch for swelling in feet, ankles and legs every day - weigh myself daily    Why is this important?   It is important to check your weight daily and watch how much salt and liquids you have.  It will help you to manage your heart failure.    Notes:  12/27/20-Wgt stable. Patient has lost about 9 lbs. She reports adhering to diuretic regimen. She denies an edema or SOB during this call.  03/28/21-Patient reviewed wgt log with RN CM. She denies any SOB. She has followed up with cardiologist recently.     .  (THN)Track and Manage My Blood Pressure (pt-stated)        Timeframe:  Long-Range Goal Priority:  High Start Date:  09/04/20                           Expected End Date:  July 2022                    Follow Up Date   July 2022 - check blood pressure daily - write blood pressure results in a log or diary  -take BP log to all MD appts -call MD for any abnornal BP readings.   Why is this important?   You won't feel high blood pressure, but it can still hurt your blood vessels.  High blood pressure can cause heart or kidney problems. It can also cause a stroke.  Making lifestyle changes like losing a little weight or eating less salt will help.  Checking your blood pressure at home and at different times of the day can help to control blood pressure.  If the doctor  prescribes medicine remember to take it the way the doctor ordered.  Call the office if you cannot afford the medicine or if there are questions about it.     Notes: 12/26/20-RN CM did not get to address this call-patient had to end call early  03/28/21-Patient checking BP daily and reviewed Bp log with RN CM during call. BP remains fairly stable. She is adhering to med regimen. She is not at goal BP per MD recommendations.     .  COMPLETED: (THN)Track and Manage Symptoms (pt-stated)        Timeframe:  Long-Range Goal Priority:  High Start Date:   09/04/20                          Expected End Date:   04/23/2021                   Follow Up Date May 2022   - develop a rescue plan - eat more whole grains, fruits and vegetables, lean meats and healthy fats - follow rescue plan if symptoms flare-up - know when to call the doctor - track symptoms and what helps feel better or worse    Why is this important?   You will be able to handle your symptoms better if you  keep track of them.  Making some simple changes to your lifestyle will help.  Eating healthy is one thing you can do to take good care of yourself.    Notes:  12/26/20-Patient reports that he forgets or does not allows feel like monitoring wgt. Reinforced to patient importance of wgt monitoring and proper technique as well as when to seek medical attention. Wgt stable at 209 lbs.   03/28/21-Wgt remains table at 207lbs. She denies any edema at present-states she had some mild edema to right leg the other day that was relieved with compression sock and elevation. Patient adhering to low salt diet and med regimen.          Plan: RN CM discussed with patient next outreach within the month of July. Patient gave verbal consent and in agreement with RN CM follow up and timeframe. Patient aware that they may contact RN CM sooner for any issues or concerns. RN CM reviewed goals and plan of care with patient. Patient agrees to care plan and  follow up.  RN CM will send quarterly update to PCP.  [name], RN,BSN,[facility] Telephonic Care Management Coordinator Direct Phone: [phone] Toll Free: [phone] Fax: [phone]

## 2021-04-09 ENCOUNTER — Telehealth: Payer: Self-pay | Admitting: Internal Medicine

## 2021-04-09 DIAGNOSIS — Z515 Encounter for palliative care: Secondary | ICD-10-CM

## 2021-04-09 DIAGNOSIS — M8949 Other hypertrophic osteoarthropathy, multiple sites: Secondary | ICD-10-CM

## 2021-04-09 DIAGNOSIS — M159 Polyosteoarthritis, unspecified: Secondary | ICD-10-CM

## 2021-04-09 MED ORDER — HYDROCODONE-ACETAMINOPHEN 5-325 MG PO TABS: 1.0000 | ORAL_TABLET | Freq: Four times a day (QID) | ORAL | 0 refills | Status: DC | PRN

## 2021-04-09 NOTE — Telephone Encounter (Signed)
1.Medication Requested: HYDROcodone-acetaminophen (NORCO/VICODIN) 5-325 MG tablet    2. Pharmacy (Name, Street, [place]): [facility] [phone] - [place], [place] - [address] AT [place]  3. On Med List: yes   4. Last Visit with PCP: 03-20-21  5. Next visit date with PCP: 09-19-21   Agent: Please be advised that RX refills may take up to 3 business days. We ask that you follow-up with your pharmacy.

## 2021-04-09 NOTE — Addendum Note (Signed)
Addended by: Binnie Rail on: 04/09/2021 10:17 AM   Modules accepted: Orders

## 2021-04-21 ENCOUNTER — Other Ambulatory Visit: Payer: Self-pay | Admitting: Internal Medicine

## 2021-04-21 DIAGNOSIS — F411 Generalized anxiety disorder: Secondary | ICD-10-CM

## 2021-04-28 ENCOUNTER — Emergency Department (HOSPITAL_COMMUNITY): Payer: Medicare HMO

## 2021-04-28 ENCOUNTER — Inpatient Hospital Stay (HOSPITAL_COMMUNITY)
Admission: EM | Admit: 2021-04-28 | Discharge: 2021-04-30 | DRG: 392 | Disposition: A | Discharge status: Home / Self Care | Payer: Medicare HMO | Attending: Internal Medicine | Admitting: Internal Medicine

## 2021-04-28 ENCOUNTER — Other Ambulatory Visit: Payer: Self-pay

## 2021-04-28 ENCOUNTER — Encounter (HOSPITAL_COMMUNITY): Payer: Self-pay

## 2021-04-28 DIAGNOSIS — R109 Unspecified abdominal pain: Secondary | ICD-10-CM | POA: Diagnosis present

## 2021-04-28 DIAGNOSIS — Z8249 Family history of ischemic heart disease and other diseases of the circulatory system: Secondary | ICD-10-CM | POA: Diagnosis not present

## 2021-04-28 DIAGNOSIS — Z79899 Other long term (current) drug therapy: Secondary | ICD-10-CM | POA: Diagnosis not present

## 2021-04-28 DIAGNOSIS — K5792 Diverticulitis of intestine, part unspecified, without perforation or abscess without bleeding: Secondary | ICD-10-CM | POA: Diagnosis not present

## 2021-04-28 DIAGNOSIS — K5730 Diverticulosis of large intestine without perforation or abscess without bleeding: Secondary | ICD-10-CM | POA: Diagnosis not present

## 2021-04-28 DIAGNOSIS — E119 Type 2 diabetes mellitus without complications: Secondary | ICD-10-CM | POA: Diagnosis present

## 2021-04-28 DIAGNOSIS — Z90710 Acquired absence of both cervix and uterus: Secondary | ICD-10-CM

## 2021-04-28 DIAGNOSIS — G8929 Other chronic pain: Secondary | ICD-10-CM | POA: Diagnosis present

## 2021-04-28 DIAGNOSIS — G4733 Obstructive sleep apnea (adult) (pediatric): Secondary | ICD-10-CM | POA: Diagnosis present

## 2021-04-28 DIAGNOSIS — I959 Hypotension, unspecified: Secondary | ICD-10-CM | POA: Diagnosis not present

## 2021-04-28 DIAGNOSIS — D509 Iron deficiency anemia, unspecified: Secondary | ICD-10-CM | POA: Diagnosis not present

## 2021-04-28 DIAGNOSIS — D696 Thrombocytopenia, unspecified: Secondary | ICD-10-CM | POA: Diagnosis not present

## 2021-04-28 DIAGNOSIS — R0902 Hypoxemia: Secondary | ICD-10-CM | POA: Diagnosis not present

## 2021-04-28 DIAGNOSIS — Z885 Allergy status to narcotic agent status: Secondary | ICD-10-CM | POA: Diagnosis not present

## 2021-04-28 DIAGNOSIS — Z96651 Presence of right artificial knee joint: Secondary | ICD-10-CM | POA: Diagnosis present

## 2021-04-28 DIAGNOSIS — Z888 Allergy status to other drugs, medicaments and biological substances status: Secondary | ICD-10-CM | POA: Diagnosis not present

## 2021-04-28 DIAGNOSIS — F411 Generalized anxiety disorder: Secondary | ICD-10-CM | POA: Diagnosis not present

## 2021-04-28 DIAGNOSIS — M1990 Unspecified osteoarthritis, unspecified site: Secondary | ICD-10-CM | POA: Diagnosis present

## 2021-04-28 DIAGNOSIS — E785 Hyperlipidemia, unspecified: Secondary | ICD-10-CM | POA: Diagnosis not present

## 2021-04-28 DIAGNOSIS — R110 Nausea: Secondary | ICD-10-CM | POA: Diagnosis not present

## 2021-04-28 DIAGNOSIS — K5732 Diverticulitis of large intestine without perforation or abscess without bleeding: Secondary | ICD-10-CM | POA: Diagnosis not present

## 2021-04-28 DIAGNOSIS — I110 Hypertensive heart disease with heart failure: Secondary | ICD-10-CM | POA: Diagnosis not present

## 2021-04-28 DIAGNOSIS — R531 Weakness: Secondary | ICD-10-CM | POA: Diagnosis not present

## 2021-04-28 DIAGNOSIS — I160 Hypertensive urgency: Secondary | ICD-10-CM | POA: Diagnosis not present

## 2021-04-28 DIAGNOSIS — R1084 Generalized abdominal pain: Secondary | ICD-10-CM | POA: Diagnosis not present

## 2021-04-28 DIAGNOSIS — Z20822 Contact with and (suspected) exposure to covid-19: Secondary | ICD-10-CM | POA: Diagnosis not present

## 2021-04-28 DIAGNOSIS — I5032 Chronic diastolic (congestive) heart failure: Secondary | ICD-10-CM | POA: Diagnosis present

## 2021-04-28 DIAGNOSIS — I10 Essential (primary) hypertension: Secondary | ICD-10-CM | POA: Diagnosis not present

## 2021-04-28 DIAGNOSIS — R42 Dizziness and giddiness: Secondary | ICD-10-CM | POA: Diagnosis not present

## 2021-04-28 DIAGNOSIS — K4020 Bilateral inguinal hernia, without obstruction or gangrene, not specified as recurrent: Secondary | ICD-10-CM | POA: Diagnosis not present

## 2021-04-28 LAB — URINALYSIS, ROUTINE W REFLEX MICROSCOPIC
Bilirubin Urine: NEGATIVE
Glucose, UA: NEGATIVE mg/dL
Hgb urine dipstick: NEGATIVE
Ketones, ur: NEGATIVE mg/dL
Nitrite: NEGATIVE
Protein, ur: NEGATIVE mg/dL
Specific Gravity, Urine: 1.008 (ref 1.005–1.030)
pH: 7 (ref 5.0–8.0)

## 2021-04-28 LAB — CBC WITH DIFFERENTIAL/PLATELET
Abs Immature Granulocytes: 0.02 10*3/uL (ref 0.00–0.07)
Basophils Absolute: 0 10*3/uL (ref 0.0–0.1)
Basophils Relative: 0 %
Eosinophils Absolute: 0.3 10*3/uL (ref 0.0–0.5)
Eosinophils Relative: 4 %
HCT: 34.6 % — ABNORMAL LOW (ref 36.0–46.0)
Hemoglobin: 10.5 g/dL — ABNORMAL LOW (ref 12.0–15.0)
Immature Granulocytes: 0 %
Lymphocytes Relative: 19 %
Lymphs Abs: 1.4 10*3/uL (ref 0.7–4.0)
MCH: 29.3 pg (ref 26.0–34.0)
MCHC: 30.3 g/dL (ref 30.0–36.0)
MCV: 96.6 fL (ref 80.0–100.0)
Monocytes Absolute: 0.4 10*3/uL (ref 0.1–1.0)
Monocytes Relative: 6 %
Neutro Abs: 5.1 10*3/uL (ref 1.7–7.7)
Neutrophils Relative %: 71 %
Platelets: 128 10*3/uL — ABNORMAL LOW (ref 150–400)
RBC: 3.58 MIL/uL — ABNORMAL LOW (ref 3.87–5.11)
RDW: 13.5 % (ref 11.5–15.5)
WBC: 7.2 10*3/uL (ref 4.0–10.5)
nRBC: 0 % (ref 0.0–0.2)

## 2021-04-28 LAB — COMPREHENSIVE METABOLIC PANEL
ALT: 9 U/L (ref 0–44)
AST: 30 U/L (ref 15–41)
Albumin: 3.6 g/dL (ref 3.5–5.0)
Alkaline Phosphatase: 52 U/L (ref 38–126)
Anion gap: 8 (ref 5–15)
BUN: 10 mg/dL (ref 8–23)
CO2: 27 mmol/L (ref 22–32)
Calcium: 8.9 mg/dL (ref 8.9–10.3)
Chloride: 105 mmol/L (ref 98–111)
Creatinine, Ser: 0.74 mg/dL (ref 0.44–1.00)
GFR, Estimated: >60 mL/min (ref >60)
Glucose, Bld: 87 mg/dL (ref 70–99)
Potassium: 4.7 mmol/L (ref 3.5–5.1)
Sodium: 140 mmol/L (ref 135–145)
Total Bilirubin: 1.3 mg/dL — ABNORMAL HIGH (ref 0.3–1.2)
Total Protein: 6.5 g/dL (ref 6.5–8.1)

## 2021-04-28 LAB — LIPASE, BLOOD: Lipase: 23 U/L (ref 11–51)

## 2021-04-28 MED ORDER — METRONIDAZOLE 500 MG/100ML IV SOLN
500.0000 mg | Freq: Once | INTRAVENOUS | Status: AC
  Administered 2021-04-28: 500 mg via INTRAVENOUS
  Filled 2021-04-28: qty 100

## 2021-04-28 MED ORDER — CIPROFLOXACIN HCL 500 MG PO TABS: 500.0000 mg | ORAL_TABLET | Freq: Two times a day (BID) | ORAL | 0 refills | Status: DC

## 2021-04-28 MED ORDER — ONDANSETRON HCL 4 MG/2ML IJ SOLN
4.0000 mg | Freq: Once | INTRAMUSCULAR | Status: AC
  Administered 2021-04-28: 4 mg via INTRAVENOUS
  Filled 2021-04-28: qty 2

## 2021-04-28 MED ORDER — SODIUM CHLORIDE 0.9 % IV SOLN: INTRAVENOUS | Status: DC

## 2021-04-28 MED ORDER — PANTOPRAZOLE SODIUM 40 MG PO TBEC
40.0000 mg | DELAYED_RELEASE_TABLET | Freq: Every day | ORAL | Status: DC
  Administered 2021-04-29 – 2021-04-30 (×2): 40 mg via ORAL
  Filled 2021-04-28 (×2): qty 1

## 2021-04-28 MED ORDER — SODIUM CHLORIDE 0.9 % IV SOLN
2.0000 g | INTRAVENOUS | Status: DC
  Administered 2021-04-28 – 2021-04-29 (×2): 2 g via INTRAVENOUS
  Filled 2021-04-28: qty 2
  Filled 2021-04-28 (×2): qty 20

## 2021-04-28 MED ORDER — CARVEDILOL 12.5 MG PO TABS
12.5000 mg | ORAL_TABLET | Freq: Two times a day (BID) | ORAL | Status: DC
  Administered 2021-04-29 – 2021-04-30 (×3): 12.5 mg via ORAL
  Filled 2021-04-28 (×3): qty 1

## 2021-04-28 MED ORDER — CARVEDILOL 12.5 MG PO TABS: 12.5000 mg | ORAL_TABLET | Freq: Two times a day (BID) | ORAL | Status: DC

## 2021-04-28 MED ORDER — ONDANSETRON HCL 4 MG PO TABS: 4.0000 mg | ORAL_TABLET | Freq: Four times a day (QID) | ORAL | Status: DC | PRN

## 2021-04-28 MED ORDER — HYDRALAZINE HCL 50 MG PO TABS
50.0000 mg | ORAL_TABLET | Freq: Two times a day (BID) | ORAL | Status: DC
  Administered 2021-04-29 – 2021-04-30 (×3): 50 mg via ORAL
  Filled 2021-04-28 (×3): qty 1

## 2021-04-28 MED ORDER — HYDROCODONE-ACETAMINOPHEN 5-325 MG PO TABS
1.0000 | ORAL_TABLET | Freq: Four times a day (QID) | ORAL | Status: DC | PRN
  Administered 2021-04-28: 1 via ORAL
  Administered 2021-04-29 – 2021-04-30 (×5): 2 via ORAL
  Filled 2021-04-28 (×5): qty 2
  Filled 2021-04-28: qty 1

## 2021-04-28 MED ORDER — TRAZODONE HCL 50 MG PO TABS
75.0000 mg | ORAL_TABLET | Freq: Every day | ORAL | Status: DC
  Administered 2021-04-28 – 2021-04-29 (×2): 75 mg via ORAL
  Filled 2021-04-28 (×2): qty 2

## 2021-04-28 MED ORDER — ACETAMINOPHEN 325 MG PO TABS: 650.0000 mg | ORAL_TABLET | Freq: Four times a day (QID) | ORAL | Status: DC | PRN

## 2021-04-28 MED ORDER — ALBUTEROL SULFATE (2.5 MG/3ML) 0.083% IN NEBU
2.5000 mg | INHALATION_SOLUTION | Freq: Four times a day (QID) | RESPIRATORY_TRACT | Status: DC | PRN
  Administered 2021-04-28 – 2021-04-30 (×4): 2.5 mg via RESPIRATORY_TRACT
  Filled 2021-04-28 (×4): qty 3

## 2021-04-28 MED ORDER — SPIRONOLACTONE 25 MG PO TABS
25.0000 mg | ORAL_TABLET | Freq: Once | ORAL | Status: DC
  Filled 2021-04-28: qty 1

## 2021-04-28 MED ORDER — ONDANSETRON HCL 4 MG/2ML IJ SOLN
4.0000 mg | Freq: Four times a day (QID) | INTRAMUSCULAR | Status: DC | PRN
  Administered 2021-04-29 – 2021-04-30 (×3): 4 mg via INTRAVENOUS
  Filled 2021-04-28 (×3): qty 2

## 2021-04-28 MED ORDER — ONDANSETRON 4 MG PO TBDP
4.0000 mg | ORAL_TABLET | Freq: Once | ORAL | Status: AC
  Administered 2021-04-28: 4 mg via ORAL
  Filled 2021-04-28: qty 1

## 2021-04-28 MED ORDER — METRONIDAZOLE 500 MG PO TABS: 500.0000 mg | ORAL_TABLET | Freq: Two times a day (BID) | ORAL | 0 refills | Status: DC

## 2021-04-28 MED ORDER — HYDRALAZINE HCL 50 MG PO TABS: 50.0000 mg | ORAL_TABLET | Freq: Three times a day (TID) | ORAL | Status: DC | PRN

## 2021-04-28 MED ORDER — MIRABEGRON ER 25 MG PO TB24
50.0000 mg | ORAL_TABLET | Freq: Every day | ORAL | Status: DC
  Administered 2021-04-29 – 2021-04-30 (×2): 50 mg via ORAL
  Filled 2021-04-28 (×2): qty 2

## 2021-04-28 MED ORDER — ENOXAPARIN SODIUM 40 MG/0.4ML IJ SOSY
40.0000 mg | PREFILLED_SYRINGE | INTRAMUSCULAR | Status: DC
  Administered 2021-04-28 – 2021-04-29 (×2): 40 mg via SUBCUTANEOUS
  Filled 2021-04-28 (×2): qty 0.4

## 2021-04-28 MED ORDER — ACETAMINOPHEN 650 MG RE SUPP: 650.0000 mg | Freq: Four times a day (QID) | RECTAL | Status: DC | PRN

## 2021-04-28 MED ORDER — METRONIDAZOLE 500 MG/100ML IV SOLN
500.0000 mg | Freq: Three times a day (TID) | INTRAVENOUS | Status: DC
  Administered 2021-04-29 – 2021-04-30 (×5): 500 mg via INTRAVENOUS
  Filled 2021-04-28 (×5): qty 100

## 2021-04-28 MED ORDER — HYDRALAZINE HCL 25 MG PO TABS
50.0000 mg | ORAL_TABLET | Freq: Once | ORAL | Status: AC
  Administered 2021-04-28: 50 mg via ORAL
  Filled 2021-04-28: qty 2

## 2021-04-28 MED ORDER — CIPROFLOXACIN IN D5W 400 MG/200ML IV SOLN
400.0000 mg | Freq: Once | INTRAVENOUS | Status: AC
  Administered 2021-04-28: 400 mg via INTRAVENOUS
  Filled 2021-04-28: qty 200

## 2021-04-28 MED ORDER — CLONAZEPAM 1 MG PO TABS
1.0000 mg | ORAL_TABLET | Freq: Every day | ORAL | Status: DC | PRN
  Administered 2021-04-29 – 2021-04-30 (×3): 1 mg via ORAL
  Filled 2021-04-28 (×3): qty 1

## 2021-04-28 MED ORDER — ACETAMINOPHEN 325 MG PO TABS
650.0000 mg | ORAL_TABLET | Freq: Once | ORAL | Status: AC
  Administered 2021-04-28: 650 mg via ORAL
  Filled 2021-04-28: qty 2

## 2021-04-28 NOTE — ED Provider Notes (Signed)
  Physical Exam  BP (!) 191/175   Pulse 74   Temp 98 F (36.7 C) (Oral)   Resp 16   SpO2 91%   Physical Exam  ED Course/Procedures     Procedures  MDM  [name] presented with abdominal pain.  She was found to have diverticulitis.  Overall, she was well-appearing, but when I spoke to her about discharge home, she was uncomfortable due to the amount of pain she was experiencing.  She will be admitted for further management.       Arnaldo Natal, MD 04/28/21 [id number]

## 2021-04-28 NOTE — ED Triage Notes (Addendum)
EMS reports from home c/o generalized weakness, dizziness and left side abdominal pain with nausea x 2 days.  BP 142/76 HR 90 RR 16 Sp02 93 RA CBG 110  22 L hand

## 2021-04-28 NOTE — ED Notes (Signed)
MD at bedside. 

## 2021-04-28 NOTE — H&P (Signed)
History and Physical    SIRI BUEGE CHE:[id number] DOB: Nov 10, 1933 DOA: 04/28/2021  PCP: [name], MD   Patient coming from: Home   Chief Complaint: Abdominal pain, nausea, fatigue   HPI: LORISA SCHEID is a 87 y.o. female with medical history significant for hypertension, chronic diastolic CHF, anxiety, chronic pain, anemia, thrombocytopenia, and OSA, now presenting to the emergency department for evaluation of abdominal pain, nausea, and fatigue.  Patient reports the symptoms developed and have been persistent.  She has severe nausea but no vomiting she denies diarrhea, melena, or hematochezia.  He had some chills at one point has not taken her temperature.  Pain has been fairly diffuse, constant, and with no alleviating or exacerbating factors identified.   ED Course: Upon arrival to the ED, patient is found to be afebrile, saturating well on room air, and with elevated blood pressure.  Chemistry panel is notable for bilirubin of 1.3.  CBC features a normocytic anemia with hemoglobin 10.5 and a thrombocytopenia with platelets 128,000.  CT of the abdomen and pelvis demonstrates sigmoid diverticulitis without extraluminal gas or abscess.  Patient was treated with ciprofloxacin and Flagyl in the emergency department, and oral antihypertensives were ordered but not yet administered.  Review of Systems:  All other systems reviewed and apart from HPI, are negative.  Past Medical History:  Diagnosis Date  . Abdominal pain   . Anemia    NOS  . Anxiety   . Back pain   . Bruises easily   . Chronic diastolic CHF (congestive heart failure) ([place])    a. 06/2013 EF 65-70%.  . Chronic headaches    HISTORY OF   . Depression   . Diabetes mellitus    type II  DIET CONTROLLED  . Diarrhea   . Hepatitis    AGE 30S  . Hyperlipidemia   . Hypertension   . Mild aortic stenosis    a. 06/2013 Echo: EF 65-70%, mod LVH with focal basal hypertrophy, very mild AS.  Marland Kitchen Osteoarthritis   . Oxygen  desaturation during sleep    USES 2 LITERS BEDTIME   VIA CPAP 06/2012 [facility]   . Shortness of breath    WITH EXERTION USES 2 L O2 BEDTIME  . Sleep apnea    CPAP WITH O2 2 LITERS 2013 ([facility])  . Wears dentures   . Wears glasses     Past Surgical History:  Procedure Laterality Date  . ABDOMINAL HYSTERECTOMY    . APPENDECTOMY    . CHOLECYSTECTOMY    . JOINT REPLACEMENT     LEFT KNEE   . KNEE ARTHROPLASTY Right 03/22/2015   Procedure: COMPUTER ASSISTED TOTAL KNEE ARTHROPLASTY;  Surgeon: Marybelle Killings, MD;  Location: [place];  Service: Orthopedics;  Laterality: Right;  . KNEE ARTHROSCOPY     RIGHT  . LEFT AND RIGHT HEART CATHETERIZATION WITH CORONARY ANGIOGRAM N/A 10/28/2013   Procedure: LEFT AND RIGHT HEART CATHETERIZATION WITH CORONARY ANGIOGRAM;  Surgeon: Blane Ohara, MD;  Location: [facility] CATH LAB;  Service: Cardiovascular;  Laterality: N/A;  . LEFT HEART CATHETERIZATION WITH CORONARY ANGIOGRAM N/A 11/14/2014   Procedure: LEFT HEART CATHETERIZATION WITH CORONARY ANGIOGRAM;  Surgeon: Josue Hector, MD;  Location: [facility] CATH LAB;  Service: Cardiovascular;  Laterality: N/A;  . REVISION TOTAL KNEE ARTHROPLASTY  2011  . ROTATOR CUFF REPAIR  2010  . SHOULDER ARTHROSCOPY  09/17/2012   Procedure: ARTHROSCOPY SHOULDER;  Surgeon: Sharmon Revere, MD;  Location: [place];  Service:  Orthopedics;  Laterality: Left;  . TONSILLECTOMY    . TONSILLECTOMY      Social History:   reports that she has never smoked. She has never used smokeless tobacco. She reports that she does not drink alcohol and does not use drugs.  Allergies  Allergen Reactions  . Amlodipine Other (See Comments)    Reaction:  Dizziness   . Enalapril Cough  . Losartan Potassium Other (See Comments)    Reaction:  GI pain   . Morphine And Related Itching and Nausea And Vomiting  . Metformin Diarrhea    Family History  Problem Relation Age of Onset  . Cancer Father        prostate cancer  . Heart disease Sister   . Heart  disease Brother   . Alcohol abuse Other   . Hypertension Other   . Kidney disease Other   . Mental illness Other   . Emphysema Brother      Prior to Admission medications   Medication Sig Start Date End Date Taking? Authorizing Provider  ciprofloxacin (CIPRO) 500 MG tablet Take 1 tablet (500 mg total) by mouth every 12 (twelve) hours for 7 days. 04/28/21 05/05/21 Yes Carmin Muskrat, MD  metroNIDAZOLE (FLAGYL) 500 MG tablet Take 1 tablet (500 mg total) by mouth 2 (two) times daily. 04/28/21  Yes Carmin Muskrat, MD  albuterol (PROVENTIL) (2.5 MG/3ML) 0.083% nebulizer solution INHALE 3 MLS(2.5 MG TOTAL) BY NEBULIZATION TWICE DAILY AS NEEDED FOR WHEEZING OR SHORTNESS OF BREATH 02/11/21   [name], MD  carvedilol (COREG) 12.5 MG tablet Take 12.5 mg by mouth 2 (two) times daily with a meal.    [provider]  Cholecalciferol 2000 units TABS Take 2 tablets (4,000 Units total) by mouth daily. 03/31/18   [name], MD  clonazePAM (KLONOPIN) 1 MG tablet TAKE 1 TABLET(1 MG) BY MOUTH TWICE DAILY AS NEEDED FOR ANXIETY 04/22/21   [name], MD  hydrALAZINE (APRESOLINE) 50 MG tablet Take 1 tablet (50 mg total) by mouth 2 (two) times daily. 02/19/21   [name], MD  HYDROcodone-acetaminophen (NORCO/VICODIN) 5-325 MG tablet Take 1 tablet by mouth every 6 (six) hours as needed. 04/09/21 05/10/21  Binnie Rail, MD  MYRBETRIQ 50 MG TB24 tablet TAKE 1 TABLET(50 MG) BY MOUTH DAILY 02/25/21   [name], MD  omeprazole (PRILOSEC) 40 MG capsule TAKE 1 CAPSULE(40 MG) BY MOUTH DAILY 01/03/21   [name], MD  ondansetron (ZOFRAN) 4 MG tablet Take 1 tablet (4 mg total) by mouth every 8 (eight) hours as needed for nausea or vomiting. 03/20/21   [name], MD  spironolactone (ALDACTONE) 25 MG tablet Take 0.5 tablets (12.5 mg total) by mouth daily. 08/14/20   Shawna Clamp, MD  torsemide (DEMADEX) 20 MG tablet TAKE 1 TABLET(20 MG) BY MOUTH DAILY 01/28/21   [name], MD   traZODone (DESYREL) 150 MG tablet Take 1 tablet (150 mg total) by mouth at bedtime. 02/11/21   [name], MD    Physical Exam: Vitals:   04/28/21 1845 04/28/21 1900 04/28/21 1915 04/28/21 1930  BP: (!) 218/113 (!) 196/90 (!) 197/173 (!) 191/175  Pulse: 64 64 65 74  Resp:    16  Temp:      TempSrc:      SpO2: 94% 95% 92% 91%    Constitutional: NAD, calm  Eyes: PERTLA, lids and conjunctivae normal ENMT: Mucous membranes are moist. Posterior pharynx clear of any exudate or lesions.  Neck: supple, no masses  Respiratory: no wheezing, no crackles. No accessory muscle use.  Cardiovascular: S1 & S2 heard, regular rate and rhythm. No extremity edema.   Abdomen: Soft, tender in LLQ without rebound pain or guarding. Bowel sounds active.  Musculoskeletal: no clubbing / cyanosis. No joint deformity upper and lower extremities.   Skin: no significant rashes, lesions, ulcers. Warm, dry, well-perfused. Neurologic: Gross hearing deficit, no facial asymmetry. Sensation intact. Moving all extremities.   Psychiatric: Alert and oriented to person, place, and situation. Pleasant and cooperative.    Labs and Imaging on Admission: I have personally reviewed following labs and imaging studies  CBC: Recent Labs  Lab 04/28/21 1354  WBC 7.2  NEUTROABS 5.1  HGB 10.5*  HCT 34.6*  MCV 96.6  PLT 170*   Basic Metabolic Panel: Recent Labs  Lab 04/28/21 1804  NA 140  K 4.7  CL 105  CO2 27  GLUCOSE 87  BUN 10  CREATININE 0.74  CALCIUM 8.9   GFR: CrCl cannot be calculated (Unknown ideal weight.). Liver Function Tests: Recent Labs  Lab 04/28/21 1804  AST 30  ALT 9  ALKPHOS 52  BILITOT 1.3*  PROT 6.5  ALBUMIN 3.6   Recent Labs  Lab 04/28/21 1804  LIPASE 23   No results for input(s): AMMONIA in the last 168 hours. Coagulation Profile: No results for input(s): INR, PROTIME in the last 168 hours. Cardiac Enzymes: No results for input(s): CKTOTAL, CKMB, CKMBINDEX, TROPONINI  in the last 168 hours. BNP (last 3 results) No results for input(s): PROBNP in the last 8760 hours. HbA1C: No results for input(s): HGBA1C in the last 72 hours. CBG: No results for input(s): GLUCAP in the last 168 hours. Lipid Profile: No results for input(s): CHOL, HDL, LDLCALC, TRIG, CHOLHDL, LDLDIRECT in the last 72 hours. Thyroid Function Tests: No results for input(s): TSH, T4TOTAL, FREET4, T3FREE, THYROIDAB in the last 72 hours. Anemia Panel: No results for input(s): VITAMINB12, FOLATE, FERRITIN, TIBC, IRON, RETICCTPCT in the last 72 hours. Urine analysis:    Component Value Date/Time   COLORURINE STRAW (A) 04/28/2021 1354   APPEARANCEUR CLEAR 04/28/2021 1354   LABSPEC 1.008 04/28/2021 1354   PHURINE 7.0 04/28/2021 1354   GLUCOSEU NEGATIVE 04/28/2021 1354   GLUCOSEU NEGATIVE 03/30/2018 1358   HGBUR NEGATIVE 04/28/2021 1354   BILIRUBINUR NEGATIVE 04/28/2021 1354   BILIRUBINUR negative 04/15/2019 [place] 04/28/2021 1354   PROTEINUR NEGATIVE 04/28/2021 1354   UROBILINOGEN 0.2 04/15/2019 1634   UROBILINOGEN 0.2 03/30/2018 1358   NITRITE NEGATIVE 04/28/2021 1354   LEUKOCYTESUR MODERATE (A) 04/28/2021 1354   Sepsis Labs: @LABRCNTIP (procalcitonin:4,lacticidven:4) )No results found for this or any previous visit (from the past 240 hour(s)).   Radiological Exams on Admission: CT ABDOMEN PELVIS WO CONTRAST  Result Date: 04/28/2021 CLINICAL DATA:  Weakness with nausea and left-sided abdominal pain for 2 days. EXAM: CT ABDOMEN AND PELVIS WITHOUT CONTRAST TECHNIQUE: Multidetector CT imaging of the abdomen and pelvis was performed following the standard protocol without IV contrast. COMPARISON:  08/25/2018 FINDINGS: Lower chest: Bandlike density in the right lower lobe increased from 08/25/2018, but probably from atelectasis or scarring given the configuration. Mild cardiomegaly. Descending thoracic aortic atherosclerotic calcification. Hepatobiliary: Cholecystectomy.   Otherwise unremarkable. Pancreas: Unremarkable Spleen: Unremarkable Adrenals/Urinary Tract: 3.0 by 1.8 cm right adrenal mass, internal density 39 Hounsfield units. This lesion is unchanged from 08/25/2018. Exophytic 8 mm left kidney upper pole hypodense lesion on image 17 series 2 likely a cyst and not significantly  changed from prior. Stomach/Bowel: There is some scattered diverticula of the distal ileum. Descending and sigmoid colon diverticulosis noted with some faint stranding along the sigmoid colon suggesting mild acute diverticulitis. No extraluminal gas or abscess. Vascular/Lymphatic: Aortoiliac atherosclerotic vascular disease. Reproductive: Uterus absent.  Adnexa unremarkable. Other: No supplemental non-categorized findings. Musculoskeletal: Mild grade 1 degenerative anterolisthesis at L4-5 and L5-S1. Fused facet joints at L5-S1 bilaterally. Chondrocalcinosis with degenerative hip arthropathy bilaterally, left greater than right. Small bilateral groin hernias containing adipose tissue. IMPRESSION: 1. Descending and sigmoid colon diverticulosis with mild sigmoid colon diverticulitis. 2. Scattered diverticula of the distal ileum, without inflammatory findings along the distal ileum. 3. Other imaging findings of potential clinical significance: Aortic Atherosclerosis (ICD10-I70.0). Mild cardiomegaly. Chronically stable nonspecific right adrenal mass. Bilateral chronic hip arthropathy with chondrocalcinosis. Small bilateral groin hernias contain adipose tissue. Electronically Signed   By: Van Clines M.D.   On: 04/28/2021 14:57    Assessment/Plan   1. Acute sigmoid diverticulitis  - Presents with 2 days of abdominal pain, nausea, and fatigue and has CT findings suggestive of sigmoid diverticulitis without extraluminal gas or abscess  - She was started on antibiotics in ED  - Plan for Rocephin and Flagyl, analgesics and antiemetics as needed, clear liquids for now and advance as she improves    2. Severe asymptomatic hypertension  - Continue hydralazine and Coreg with her PM doses now, use additional hydralazine as needed    3. Chronic diastolic CHF  - Appears compensated  - She has not been eating or drinking much due to the current illness, will hold diuretics for now, continue Coreg    4. Anxiety  - She is calm on admission  - Continue as-needed Klonopin    5. Anemia; thrombocytopenia  - Hgb is 10.5, platelets 128,000 on admission  - She denies melena or hematochezia, PCP was planning for anemia panel per recent clinic notes   6. OSA  - Does not tolerate CPAP    DVT prophylaxis: Lovenox  Code Status: Full  Level of Care: Level of care: Med-Surg Family Communication: Daughter updated at bedside  Disposition Plan:  Patient is from: Home  Anticipated d/c is to: Home  Anticipated d/c date is: 6/5 or 04/30/21 Patient currently: Pending pain-control, tolerance of adequate oral intake  Consults called: none  Admission status: Observation     Vianne Bulls, MD [facility]  04/28/2021, 8:19 PM

## 2021-04-28 NOTE — ED Provider Notes (Signed)
[facility] DEPT Provider Note   CSN: [id number] Arrival date & time: 04/28/21  1327     History Chief Complaint  Patient presents with  . Abdominal Pain  . Nausea  . Dizziness    [name] is a 87 y.o. female.  HPI Patient presents with abdominal pain, nausea, change from solid stool, no fever, though she does have chills.  There are some polyuria, but no dysuria.  Patient has no history of diverticulitis as far she is aware. No clear precipitant, and since onset symptoms been persistent.  Abdominal pain is diffuse, sore, moderate, 4/10.       Past Medical History:  Diagnosis Date  . Abdominal pain   . Anemia    NOS  . Anxiety   . Back pain   . Bruises easily   . Chronic diastolic CHF (congestive heart failure) ([place])    a. 06/2013 EF 65-70%.  . Chronic headaches    HISTORY OF   . Depression   . Diabetes mellitus    type II  DIET CONTROLLED  . Diarrhea   . Hepatitis    AGE 30S  . Hyperlipidemia   . Hypertension   . Mild aortic stenosis    a. 06/2013 Echo: EF 65-70%, mod LVH with focal basal hypertrophy, very mild AS.  Marland Kitchen Osteoarthritis   . Oxygen desaturation during sleep    USES 2 LITERS BEDTIME   VIA CPAP 06/2012 [facility]   . Shortness of breath    WITH EXERTION USES 2 L O2 BEDTIME  . Sleep apnea    CPAP WITH O2 2 LITERS 2013 ([facility])  . Wears dentures   . Wears glasses     Patient Active Problem List   Diagnosis Date Noted  . Deficiency anemia 03/20/2021  . Encounter for palliative care involving management of pain 09/20/2020  . Hypertensive urgency 08/11/2020  . Excessive somnolence disorder 04/12/2020  . Routine general medical examination at a health care facility 04/10/2020  . Thrombocytopenia ([place]) 04/10/2020  . Anxiety 08/26/2018  . Seizure ([place]) 08/25/2018  . GERD (gastroesophageal reflux disease) 08/25/2018  . Chronic diastolic CHF (congestive heart failure) ([place]) 08/25/2018  . Major depressive disorder,  recurrent episode with anxious distress ([place]) 02/04/2017  . Dementia arising in the senium and presenium ([place]) 06/27/2016  . Obesity, Class II, BMI 35-39.9, with comorbidity 05/15/2016  . Insomnia w/ sleep apnea 10/16/2015  . IBS (irritable bowel syndrome) 02/27/2015  . Hashimoto's thyroiditis 05/05/2014  . Adrenal incidentaloma ([place]) 11/04/2013  . Benign paroxysmal positional vertigo 07/15/2013  . Aortic stenosis, mild 07/14/2013  . OAB (overactive bladder) 02/24/2013  . GERD with stricture 12/24/2012  . Sleep-related hypoventilation 07/30/2012  . OSA (obstructive sleep apnea) 04/16/2012  . Osteopenia 04/16/2012  . Preventative health care 05/24/2011  . Vitamin D deficiency 12/04/2010  . Hyperlipidemia with target LDL less than 130 12/03/2010  . GAD (generalized anxiety disorder) 12/03/2010  . Essential hypertension 12/03/2010  . Osteoarthritis 12/03/2010    Past Surgical History:  Procedure Laterality Date  . ABDOMINAL HYSTERECTOMY    . APPENDECTOMY    . CHOLECYSTECTOMY    . JOINT REPLACEMENT     LEFT KNEE   . KNEE ARTHROPLASTY Right 03/22/2015   Procedure: COMPUTER ASSISTED TOTAL KNEE ARTHROPLASTY;  Surgeon: Marybelle Killings, MD;  Location: [facility];  Service: Orthopedics;  Laterality: Right;  . KNEE ARTHROSCOPY     RIGHT  . LEFT AND RIGHT HEART CATHETERIZATION WITH CORONARY ANGIOGRAM N/A  10/28/2013   Procedure: LEFT AND RIGHT HEART CATHETERIZATION WITH CORONARY ANGIOGRAM;  Surgeon: Blane Ohara, MD;  Location: [facility] CATH LAB;  Service: Cardiovascular;  Laterality: N/A;  . LEFT HEART CATHETERIZATION WITH CORONARY ANGIOGRAM N/A 11/14/2014   Procedure: LEFT HEART CATHETERIZATION WITH CORONARY ANGIOGRAM;  Surgeon: Josue Hector, MD;  Location[facility] LAB;  Service: Cardiovascular;  Laterality: N/A;  . REVISION TOTAL KNEE ARTHROPLASTY  2011  . ROTATOR CUFF REPAIR  2010  . SHOULDER ARTHROSCOPY  09/17/2012   Procedure: ARTHROSCOPY SHOULDER;  Surgeon: Sharmon Revere, MD;  Location: [facility];  Service: Orthopedics;  Laterality: Left;  . TONSILLECTOMY    . TONSILLECTOMY       OB History   No obstetric history on file.     Family History  Problem Relation Age of Onset  . Cancer Father        prostate cancer  . Heart disease Sister   . Heart disease Brother   . Alcohol abuse Other   . Hypertension Other   . Kidney disease Other   . Mental illness Other   . Emphysema Brother     Social History   Tobacco Use  . Smoking status: Never Smoker  . Smokeless tobacco: Never Used  Vaping Use  . Vaping Use: Never used  Substance Use Topics  . Alcohol use: No  . Drug use: No    Home Medications Prior to Admission medications   Medication Sig Start Date End Date Taking? Authorizing Provider  albuterol (PROVENTIL) (2.5 MG/3ML) 0.083% nebulizer solution INHALE 3 MLS(2.5 MG TOTAL) BY NEBULIZATION TWICE DAILY AS NEEDED FOR WHEEZING OR SHORTNESS OF BREATH 02/11/21   [name], MD  carvedilol (COREG) 12.5 MG tablet Take 12.5 mg by mouth 2 (two) times daily with a meal.    [provider]  Cholecalciferol 2000 units TABS Take 2 tablets (4,000 Units total) by mouth daily. 03/31/18   [name], MD  clonazePAM (KLONOPIN) 1 MG tablet TAKE 1 TABLET(1 MG) BY MOUTH TWICE DAILY AS NEEDED FOR ANXIETY 04/22/21   [name], MD  hydrALAZINE (APRESOLINE) 50 MG tablet Take 1 tablet (50 mg total) by mouth 2 (two) times daily. 02/19/21   [name], MD  HYDROcodone-acetaminophen (NORCO/VICODIN) 5-325 MG tablet Take 1 tablet by mouth every 6 (six) hours as needed. 04/09/21 05/10/21  Binnie Rail, MD  MYRBETRIQ 50 MG TB24 tablet TAKE 1 TABLET(50 MG) BY MOUTH DAILY 02/25/21   [name], MD  omeprazole (PRILOSEC) 40 MG capsule TAKE 1 CAPSULE(40 MG) BY MOUTH DAILY 01/03/21   [name], MD  ondansetron (ZOFRAN) 4 MG tablet Take 1 tablet (4 mg total) by mouth every 8 (eight) hours as needed for nausea or vomiting. 03/20/21   [name], MD   spironolactone (ALDACTONE) 25 MG tablet Take 0.5 tablets (12.5 mg total) by mouth daily. 08/14/20   Shawna Clamp, MD  torsemide (DEMADEX) 20 MG tablet TAKE 1 TABLET(20 MG) BY MOUTH DAILY 01/28/21   [name], MD  traZODone (DESYREL) 150 MG tablet Take 1 tablet (150 mg total) by mouth at bedtime. 02/11/21   [name], MD    Allergies    Amlodipine, Enalapril, Losartan potassium, Morphine and related, and Metformin  Review of Systems   Review of Systems  Constitutional:       Per HPI, otherwise negative  HENT:       Per HPI, otherwise negative  Respiratory:  Per HPI, otherwise negative  Cardiovascular:       Per HPI, otherwise negative  Gastrointestinal: Positive for abdominal pain and nausea. Negative for vomiting.  Endocrine:       Negative aside from HPI  Genitourinary:       Neg aside from HPI   Musculoskeletal:       Per HPI, otherwise negative  Skin: Negative.   Neurological: Negative for syncope.    Physical Exam Updated Vital Signs BP 140/66   Pulse 61   Temp 98 F (36.7 C) (Oral)   Resp 18   SpO2 93%   Physical Exam Vitals and nursing note reviewed.  Constitutional:      Appearance: She is well-developed. She is obese.  HENT:     Head: Normocephalic and atraumatic.  Eyes:     Conjunctiva/sclera: Conjunctivae normal.  Cardiovascular:     Rate and Rhythm: Normal rate and regular rhythm.  Pulmonary:     Effort: Pulmonary effort is normal. No respiratory distress.     Breath sounds: Normal breath sounds. No stridor.  Abdominal:     General: There is no distension.     Tenderness: There is generalized abdominal tenderness.  Skin:    General: Skin is warm and dry.  Neurological:     Mental Status: She is alert and oriented to person, place, and time.     Cranial Nerves: No cranial nerve deficit.     ED Results / Procedures / Treatments   Labs (all labs ordered are listed, but only abnormal results are displayed) Labs Reviewed  CBC  WITH DIFFERENTIAL/PLATELET - Abnormal; Notable for the following components:      Result Value   RBC 3.58 (*)    Hemoglobin 10.5 (*)    HCT 34.6 (*)    Platelets 128 (*)    All other components within normal limits  URINALYSIS, ROUTINE W REFLEX MICROSCOPIC - Abnormal; Notable for the following components:   Color, Urine STRAW (*)    Leukocytes,Ua MODERATE (*)    Bacteria, UA RARE (*)    All other components within normal limits  COMPREHENSIVE METABOLIC PANEL  LIPASE, BLOOD    EKG None  Radiology CT ABDOMEN PELVIS WO CONTRAST  Result Date: 04/28/2021 CLINICAL DATA:  Weakness with nausea and left-sided abdominal pain for 2 days. EXAM: CT ABDOMEN AND PELVIS WITHOUT CONTRAST TECHNIQUE: Multidetector CT imaging of the abdomen and pelvis was performed following the standard protocol without IV contrast. COMPARISON:  08/25/2018 FINDINGS: Lower chest: Bandlike density in the right lower lobe increased from 08/25/2018, but probably from atelectasis or scarring given the configuration. Mild cardiomegaly. Descending thoracic aortic atherosclerotic calcification. Hepatobiliary: Cholecystectomy.  Otherwise unremarkable. Pancreas: Unremarkable Spleen: Unremarkable Adrenals/Urinary Tract: 3.0 by 1.8 cm right adrenal mass, internal density 39 Hounsfield units. This lesion is unchanged from 08/25/2018. Exophytic 8 mm left kidney upper pole hypodense lesion on image 17 series 2 likely a cyst and not significantly changed from prior. Stomach/Bowel: There is some scattered diverticula of the distal ileum. Descending and sigmoid colon diverticulosis noted with some faint stranding along the sigmoid colon suggesting mild acute diverticulitis. No extraluminal gas or abscess. Vascular/Lymphatic: Aortoiliac atherosclerotic vascular disease. Reproductive: Uterus absent.  Adnexa unremarkable. Other: No supplemental non-categorized findings. Musculoskeletal: Mild grade 1 degenerative anterolisthesis at L4-5 and L5-S1.  Fused facet joints at L5-S1 bilaterally. Chondrocalcinosis with degenerative hip arthropathy bilaterally, left greater than right. Small bilateral groin hernias containing adipose tissue. IMPRESSION: 1. Descending and sigmoid colon diverticulosis with mild sigmoid  colon diverticulitis. 2. Scattered diverticula of the distal ileum, without inflammatory findings along the distal ileum. 3. Other imaging findings of potential clinical significance: Aortic Atherosclerosis (ICD10-I70.0). Mild cardiomegaly. Chronically stable nonspecific right adrenal mass. Bilateral chronic hip arthropathy with chondrocalcinosis. Small bilateral groin hernias contain adipose tissue. Electronically Signed   By: Van Clines M.D.   On: 04/28/2021 14:57    Procedures Procedures   Medications Ordered in ED Medications  0.9 %  sodium chloride infusion ( Intravenous New Bag/Given 04/28/21 1438)  ciprofloxacin (CIPRO) IVPB 400 mg (has no administration in time range)  metroNIDAZOLE (FLAGYL) IVPB 500 mg (has no administration in time range)  ondansetron (ZOFRAN) injection 4 mg (4 mg Intravenous Given 04/28/21 1440)    ED Course  I have reviewed the triage vital signs and the nursing notes.  Pertinent labs & imaging results that were available during my care of the patient were reviewed by me and considered in my medical decision making (see chart for details).     3:37 PM   Patient in no distress, hemodynamically unremarkable.  Aware of findings thus far and I reviewed her CT, labs, urinalysis.  Concern for acute diverticulitis, but no abscess, no perforation. Patient will start IV antibiotics, continue fluid resuscitation, require repeat evaluation, but may be appropriate for discharge pending further results.  Dr. Joya Gaskins is aware.  MDM Rules/Calculators/A&P MDM Number of Diagnoses or Management Options Acute diverticulitis: new, needed workup   Amount and/or Complexity of Data Reviewed Clinical lab tests:  ordered and reviewed Tests in the radiology section of CPT: ordered and reviewed Tests in the medicine section of CPT: reviewed and ordered Decide to obtain previous medical records or to obtain history from someone other than the patient: yes Review and summarize past medical records: yes Discuss the patient with other providers: yes Independent visualization of images, tracings, or specimens: yes  Risk of Complications, Morbidity, and/or Mortality Presenting problems: high Diagnostic procedures: high Management options: high  Critical Care Total time providing critical care: < 30 minutes  Patient Progress Patient progress: stable   Final Clinical Impression(s) / ED Diagnoses Final diagnoses:  Acute diverticulitis    Rx / DC Orders ED Discharge Orders         Ordered    ciprofloxacin (CIPRO) 500 MG tablet  Every 12 hours        04/28/21 1538    metroNIDAZOLE (FLAGYL) 500 MG tablet  2 times daily        04/28/21 1538           Carmin Muskrat, MD 04/28/21 1540

## 2021-04-28 NOTE — Discharge Instructions (Addendum)
Please be sure to follow-up with your physician.  Return here for concerning changes in your condition. 

## 2021-04-29 ENCOUNTER — Encounter (HOSPITAL_COMMUNITY): Payer: Self-pay | Admitting: Family Medicine

## 2021-04-29 DIAGNOSIS — Z8249 Family history of ischemic heart disease and other diseases of the circulatory system: Secondary | ICD-10-CM | POA: Diagnosis not present

## 2021-04-29 DIAGNOSIS — M1990 Unspecified osteoarthritis, unspecified site: Secondary | ICD-10-CM | POA: Diagnosis present

## 2021-04-29 DIAGNOSIS — I110 Hypertensive heart disease with heart failure: Secondary | ICD-10-CM | POA: Diagnosis present

## 2021-04-29 DIAGNOSIS — I5032 Chronic diastolic (congestive) heart failure: Secondary | ICD-10-CM | POA: Diagnosis present

## 2021-04-29 DIAGNOSIS — Z96651 Presence of right artificial knee joint: Secondary | ICD-10-CM | POA: Diagnosis present

## 2021-04-29 DIAGNOSIS — F411 Generalized anxiety disorder: Secondary | ICD-10-CM | POA: Diagnosis present

## 2021-04-29 DIAGNOSIS — K5792 Diverticulitis of intestine, part unspecified, without perforation or abscess without bleeding: Secondary | ICD-10-CM | POA: Diagnosis not present

## 2021-04-29 DIAGNOSIS — R109 Unspecified abdominal pain: Secondary | ICD-10-CM | POA: Diagnosis present

## 2021-04-29 DIAGNOSIS — K5732 Diverticulitis of large intestine without perforation or abscess without bleeding: Secondary | ICD-10-CM | POA: Diagnosis present

## 2021-04-29 DIAGNOSIS — Z90710 Acquired absence of both cervix and uterus: Secondary | ICD-10-CM | POA: Diagnosis not present

## 2021-04-29 DIAGNOSIS — I160 Hypertensive urgency: Secondary | ICD-10-CM

## 2021-04-29 DIAGNOSIS — E785 Hyperlipidemia, unspecified: Secondary | ICD-10-CM | POA: Diagnosis present

## 2021-04-29 DIAGNOSIS — D696 Thrombocytopenia, unspecified: Secondary | ICD-10-CM | POA: Diagnosis present

## 2021-04-29 DIAGNOSIS — Z888 Allergy status to other drugs, medicaments and biological substances status: Secondary | ICD-10-CM | POA: Diagnosis not present

## 2021-04-29 DIAGNOSIS — Z79899 Other long term (current) drug therapy: Secondary | ICD-10-CM | POA: Diagnosis not present

## 2021-04-29 DIAGNOSIS — Z20822 Contact with and (suspected) exposure to covid-19: Secondary | ICD-10-CM | POA: Diagnosis present

## 2021-04-29 DIAGNOSIS — E119 Type 2 diabetes mellitus without complications: Secondary | ICD-10-CM | POA: Diagnosis present

## 2021-04-29 DIAGNOSIS — G8929 Other chronic pain: Secondary | ICD-10-CM | POA: Diagnosis present

## 2021-04-29 DIAGNOSIS — Z885 Allergy status to narcotic agent status: Secondary | ICD-10-CM | POA: Diagnosis not present

## 2021-04-29 DIAGNOSIS — D509 Iron deficiency anemia, unspecified: Secondary | ICD-10-CM | POA: Diagnosis present

## 2021-04-29 DIAGNOSIS — G4733 Obstructive sleep apnea (adult) (pediatric): Secondary | ICD-10-CM | POA: Diagnosis present

## 2021-04-29 LAB — COMPREHENSIVE METABOLIC PANEL
ALT: 10 U/L (ref 0–44)
AST: 13 U/L — ABNORMAL LOW (ref 15–41)
Albumin: 3.4 g/dL — ABNORMAL LOW (ref 3.5–5.0)
Alkaline Phosphatase: 51 U/L (ref 38–126)
Anion gap: 7 (ref 5–15)
BUN: 11 mg/dL (ref 8–23)
CO2: 28 mmol/L (ref 22–32)
Calcium: 9 mg/dL (ref 8.9–10.3)
Chloride: 105 mmol/L (ref 98–111)
Creatinine, Ser: 0.69 mg/dL (ref 0.44–1.00)
GFR, Estimated: >60 mL/min (ref >60)
Glucose, Bld: 113 mg/dL — ABNORMAL HIGH (ref 70–99)
Potassium: 3.7 mmol/L (ref 3.5–5.1)
Sodium: 140 mmol/L (ref 135–145)
Total Bilirubin: 0.9 mg/dL (ref 0.3–1.2)
Total Protein: 6.2 g/dL — ABNORMAL LOW (ref 6.5–8.1)

## 2021-04-29 LAB — RETICULOCYTES
Immature Retic Fract: 12.7 % (ref 2.3–15.9)
RBC.: 4.22 MIL/uL (ref 3.87–5.11)
Retic Count, Absolute: 84 10*3/uL (ref 19.0–186.0)
Retic Ct Pct: 2 % (ref 0.4–3.1)

## 2021-04-29 LAB — CBC
HCT: 42 % (ref 36.0–46.0)
Hemoglobin: 12.5 g/dL (ref 12.0–15.0)
MCH: 29.1 pg (ref 26.0–34.0)
MCHC: 29.8 g/dL — ABNORMAL LOW (ref 30.0–36.0)
MCV: 97.9 fL (ref 80.0–100.0)
Platelets: 153 10*3/uL (ref 150–400)
RBC: 4.29 MIL/uL (ref 3.87–5.11)
RDW: 13.7 % (ref 11.5–15.5)
WBC: 8.2 10*3/uL (ref 4.0–10.5)
nRBC: 0 % (ref 0.0–0.2)

## 2021-04-29 LAB — FOLATE: Folate: 20.2 ng/mL (ref >5.9)

## 2021-04-29 LAB — IRON AND TIBC
Iron: 76 ug/dL (ref 28–170)
Saturation Ratios: 23 % (ref 10.4–31.8)
TIBC: 326 ug/dL (ref 250–450)
UIBC: 250 ug/dL

## 2021-04-29 LAB — VITAMIN B12: Vitamin B-12: 218 pg/mL (ref 180–914)

## 2021-04-29 LAB — FERRITIN: Ferritin: 8 ng/mL — ABNORMAL LOW (ref 11–307)

## 2021-04-29 LAB — SARS CORONAVIRUS 2 (TAT 6-24 HRS): SARS Coronavirus 2: NEGATIVE

## 2021-04-29 MED ORDER — SIMETHICONE 80 MG PO CHEW
80.0000 mg | CHEWABLE_TABLET | Freq: Four times a day (QID) | ORAL | Status: DC | PRN
  Administered 2021-04-29: 80 mg via ORAL
  Filled 2021-04-29: qty 1

## 2021-04-29 NOTE — Progress Notes (Signed)
Progress Note    [name]  MGN:[id number] DOB: 1933-03-16  DOA: 04/28/2021 PCP: [name], MD      Brief Narrative:    Medical records reviewed and are as summarized below:  [name] is a 87 y.o. female (hypertension, chronic diastolic CHF, anxiety, chronic pain, anemia, thrombocytopenia, OSA, presented to the hospital because of nausea, fatigue and abdominal pain.  She was diagnosed with acute sigmoid diverticulitis.  She was treated with analgesics, IV fluids and empiric IV antibiotics.      Assessment/Plan:   Principal Problem:   Acute diverticulitis Active Problems:   GAD (generalized anxiety disorder)   OSA (obstructive sleep apnea)   Chronic diastolic CHF (congestive heart failure) (H[facility])   Thrombocytopenia ([facility])   Hypertensive urgency   Chronic pain   Acute sigmoid diverticulitis: Continue clear liquid diet.  Analgesics as needed for pain.  Continue IV antibiotics.  Hypertensive urgency: BP has improved.  Continue antihypertensives  Chronic diastolic CHF: Compensated.  Torsemide and Aldactone on hold.  Iron deficiency anemia: Ferritin 8, saturation ratio was 23.  Start ferrous sulfate when GI symptoms improved.  Borderline low vitamin B12 level (218): Start vitamin B12 supplement when GI symptoms improved.  Thrombocytopenia: Improved  Other comorbidities include chronic pain, anxiety, OSA, morbid obesity  Diet Order            Diet clear liquid Room service appropriate? Yes; Fluid consistency: Thin  Diet effective now                    Consultants:  None  Procedures:  None    Medications:   . carvedilol  12.5 mg Oral BID WC  . enoxaparin (LOVENOX) injection  40 mg Subcutaneous Q24H  . hydrALAZINE  50 mg Oral BID  . mirabegron ER  50 mg Oral Daily  . pantoprazole  40 mg Oral Daily  . traZODone  75 mg Oral QHS   Continuous Infusions: . cefTRIAXone (ROCEPHIN)  IV 2 g (04/28/21 2156)  . metronidazole 500 mg (04/29/21  0834)     Anti-infectives (From admission, onward)   Start     Dose/Rate Route Frequency Ordered Stop   04/29/21 0000  metroNIDAZOLE (FLAGYL) IVPB 500 mg        500 mg 100 mL/hr over 60 Minutes Intravenous Every 8 hours 04/28/21 2019     04/28/21 2200  cefTRIAXone (ROCEPHIN) 2 g in sodium chloride 0.9 % 100 mL IVPB        2 g 200 mL/hr over 30 Minutes Intravenous Every 24 hours 04/28/21 2019     04/28/21 1515  ciprofloxacin (CIPRO) IVPB 400 mg        400 mg 200 mL/hr over 60 Minutes Intravenous  Once 04/28/21 1508 04/28/21 1703   04/28/21 1515  metroNIDAZOLE (FLAGYL) IVPB 500 mg        500 mg 100 mL/hr over 60 Minutes Intravenous  Once 04/28/21 1508 04/28/21 1704   04/28/21 0000  ciprofloxacin (CIPRO) 500 MG tablet        500 mg Oral Every 12 hours 04/28/21 1538 05/05/21 2359   04/28/21 0000  metroNIDAZOLE (FLAGYL) 500 MG tablet        500 mg Oral 2 times daily 04/28/21 1538               Family Communication/Anticipated D/C date and plan/Code Status   DVT prophylaxis: enoxaparin (LOVENOX) injection 40 mg Start: 04/28/21 2200     Code  Status: Full Code  Family Communication: None Disposition Plan:    Status is: Observation  The patient will require care spanning > 2 midnights and should be moved to inpatient because: IV treatments appropriate due to intensity of illness or inability to take PO and Inpatient level of care appropriate due to severity of illness  Dispo: The patient is from: Home              Anticipated d/c is to: Home              Patient currently is not medically stable to d/c.   Difficult to place patient No           Subjective:   C/o pain in the abdomen  Objective:    Vitals:   04/28/21 2330 04/29/21 0043 04/29/21 0512 04/29/21 0944  BP: (!) 159/76 (!) 150/79 (!) 143/64 109/62  Pulse: 60 64 71 63  Resp: 19 18 18 17   Temp:  97.8 F (36.6 C) 98.8 F (37.1 C) 97.9 F (36.6 C)  TempSrc:  Oral Oral Oral  SpO2: 94% 96% 93%  93%   No data found.   Intake/Output Summary (Last 24 hours) at 04/29/2021 1200 Last data filed at 04/29/2021 0949 Gross per 24 hour  Intake 880 ml  Output 200 ml  Net 680 ml   There were no vitals filed for this visit.  Exam:  GEN: NAD SKIN: Warm and dry EYES: No pallor or icterus ENT: MMM CV: RRR PULM: CTA B ABD: soft, obese, lower abdominal tenderness especially in the left lower quadrant, no rebound tenderness or guarding,, +BS CNS: AAO x 3, non focal EXT: No edema or tenderness        Data Reviewed:   I have personally reviewed following labs and imaging studies:  Labs: Labs show the following:   Basic Metabolic Panel: Recent Labs  Lab 04/28/21 1804 04/29/21 0347  NA 140 140  K 4.7 3.7  CL 105 105  CO2 27 28  GLUCOSE 87 113*  BUN 10 11  CREATININE 0.74 0.69  CALCIUM 8.9 9.0   GFR CrCl cannot be calculated (Unknown ideal weight.). Liver Function Tests: Recent Labs  Lab 04/28/21 1804 04/29/21 0347  AST 30 13*  ALT 9 10  ALKPHOS 52 51  BILITOT 1.3* 0.9  PROT 6.5 6.2*  ALBUMIN 3.6 3.4*   Recent Labs  Lab 04/28/21 1804  LIPASE 23   No results for input(s): AMMONIA in the last 168 hours. Coagulation profile No results for input(s): INR, PROTIME in the last 168 hours.  CBC: Recent Labs  Lab 04/28/21 1354 04/29/21 0347  WBC 7.2 8.2  NEUTROABS 5.1  --   HGB 10.5* 12.5  HCT 34.6* 42.0  MCV 96.6 97.9  PLT 128* 153   Cardiac Enzymes: No results for input(s): CKTOTAL, CKMB, CKMBINDEX, TROPONINI in the last 168 hours. BNP (last 3 results) No results for input(s): PROBNP in the last 8760 hours. CBG: No results for input(s): GLUCAP in the last 168 hours. D-Dimer: No results for input(s): DDIMER in the last 72 hours. Hgb A1c: No results for input(s): HGBA1C in the last 72 hours. Lipid Profile: No results for input(s): CHOL, HDL, LDLCALC, TRIG, CHOLHDL, LDLDIRECT in the last 72 hours. Thyroid function studies: No results for input(s):  TSH, T4TOTAL, T3FREE, THYROIDAB in the last 72 hours.  Invalid input(s): FREET3 Anemia work up: Recent Labs    04/29/21 0347  VITAMINB12 218  FOLATE 20.2  FERRITIN 8*  TIBC 326  IRON 76  RETICCTPCT 2.0   Sepsis Labs: Recent Labs  Lab 04/28/21 1354 04/29/21 0347  WBC 7.2 8.2    Microbiology Recent Results (from the past 240 hour(s))  SARS CORONAVIRUS 2 (TAT 6-24 HRS) Nasopharyngeal Nasopharyngeal Swab     Status: None   Collection Time: 04/28/21  8:36 PM   Specimen: Nasopharyngeal Swab  Result Value Ref Range Status   SARS Coronavirus 2 NEGATIVE NEGATIVE Final    Comment: (NOTE) SARS-CoV-2 target nucleic acids are NOT DETECTED.  The SARS-CoV-2 RNA is generally detectable in upper and lower respiratory specimens during the acute phase of infection. Negative results do not preclude SARS-CoV-2 infection, do not rule out co-infections with other pathogens, and should not be used as the sole basis for treatment or other patient management decisions. Negative results must be combined with clinical observations, patient history, and epidemiological information. The expected result is Negative.  Fact Sheet for Patients: SugarRoll.be  Fact Sheet for Healthcare Providers: https://www.woods-mathews.com/  This test is not yet approved or cleared by the [place] FDA and  has been authorized for detection and/or diagnosis of SARS-CoV-2 by FDA under an Emergency Use Authorization (EUA). This EUA will remain  in effect (meaning this test can be used) for the duration of the COVID-19 declaration under Se ction 564(b)(1) of the Act, 21 U.S.C. section 360bbb-3(b)(1), unless the authorization is terminated or revoked sooner.  Performed at [facility], [place] [address], [place], [place] [address]     Procedures and diagnostic studies:  CT ABDOMEN PELVIS WO CONTRAST  Result Date: 04/28/2021 CLINICAL DATA:  Weakness with nausea  and left-sided abdominal pain for 2 days. EXAM: CT ABDOMEN AND PELVIS WITHOUT CONTRAST TECHNIQUE: Multidetector CT imaging of the abdomen and pelvis was performed following the standard protocol without IV contrast. COMPARISON:  08/25/2018 FINDINGS: Lower chest: Bandlike density in the right lower lobe increased from 08/25/2018, but probably from atelectasis or scarring given the configuration. Mild cardiomegaly. Descending thoracic aortic atherosclerotic calcification. Hepatobiliary: Cholecystectomy.  Otherwise unremarkable. Pancreas: Unremarkable Spleen: Unremarkable Adrenals/Urinary Tract: 3.0 by 1.8 cm right adrenal mass, internal density 39 Hounsfield units. This lesion is unchanged from 08/25/2018. Exophytic 8 mm left kidney upper pole hypodense lesion on image 17 series 2 likely a cyst and not significantly changed from prior. Stomach/Bowel: There is some scattered diverticula of the distal ileum. Descending and sigmoid colon diverticulosis noted with some faint stranding along the sigmoid colon suggesting mild acute diverticulitis. No extraluminal gas or abscess. Vascular/Lymphatic: Aortoiliac atherosclerotic vascular disease. Reproductive: Uterus absent.  Adnexa unremarkable. Other: No supplemental non-categorized findings. Musculoskeletal: Mild grade 1 degenerative anterolisthesis at L4-5 and L5-S1. Fused facet joints at L5-S1 bilaterally. Chondrocalcinosis with degenerative hip arthropathy bilaterally, left greater than right. Small bilateral groin hernias containing adipose tissue. IMPRESSION: 1. Descending and sigmoid colon diverticulosis with mild sigmoid colon diverticulitis. 2. Scattered diverticula of the distal ileum, without inflammatory findings along the distal ileum. 3. Other imaging findings of potential clinical significance: Aortic Atherosclerosis (ICD10-I70.0). Mild cardiomegaly. Chronically stable nonspecific right adrenal mass. Bilateral chronic hip arthropathy with chondrocalcinosis.  Small bilateral groin hernias contain adipose tissue. Electronically Signed   By: Van Clines M.D.   On: 04/28/2021 14:57               LOS: 0 days   Shaeleigh Graw  [facility]   Pager on [email or web address]. If 7PM-7AM, please contact night-coverage at [email or web address]     04/29/2021, 12:00 PM

## 2021-04-30 MED ORDER — METRONIDAZOLE 500 MG PO TABS: 500.0000 mg | ORAL_TABLET | Freq: Three times a day (TID) | ORAL | 0 refills | Status: AC

## 2021-04-30 MED ORDER — BISACODYL 10 MG RE SUPP
10.0000 mg | Freq: Once | RECTAL | Status: AC
  Administered 2021-04-30: 10 mg via RECTAL
  Filled 2021-04-30: qty 1

## 2021-04-30 MED ORDER — BISACODYL 10 MG RE SUPP: 20.0000 mg | Freq: Once | RECTAL | Status: DC

## 2021-04-30 MED ORDER — TRAZODONE HCL 150 MG PO TABS: 75.0000 mg | ORAL_TABLET | Freq: Every day | ORAL | Status: DC

## 2021-04-30 MED ORDER — CIPROFLOXACIN HCL 500 MG PO TABS: 500.0000 mg | ORAL_TABLET | Freq: Two times a day (BID) | ORAL | 0 refills | Status: AC

## 2021-04-30 MED ORDER — BISACODYL 10 MG RE SUPP
10.0000 mg | Freq: Once | RECTAL | Status: AC
  Filled 2021-04-30: qty 1

## 2021-04-30 MED ORDER — TORSEMIDE 20 MG PO TABS
20.0000 mg | ORAL_TABLET | Freq: Every day | ORAL | Status: DC
  Administered 2021-04-30: 20 mg via ORAL
  Filled 2021-04-30: qty 1

## 2021-04-30 MED ORDER — CLONAZEPAM 1 MG PO TABS: 1.0000 mg | ORAL_TABLET | Freq: Every day | ORAL | Status: DC | PRN

## 2021-04-30 MED ORDER — SPIRONOLACTONE 12.5 MG HALF TABLET
12.5000 mg | ORAL_TABLET | Freq: Every day | ORAL | Status: DC
  Administered 2021-04-30: 12.5 mg via ORAL
  Filled 2021-04-30: qty 1

## 2021-04-30 NOTE — Progress Notes (Signed)
Discharge instructions given to patient and all questions were answered.  

## 2021-04-30 NOTE — Discharge Summary (Signed)
Physician Discharge Summary  [name] HQI:[id number] DOB: 1933/11/11 DOA: 04/28/2021  PCP: [name], MD  Admit date: 04/28/2021 Discharge date: 04/30/2021  Discharge disposition: Home   Recommendations for Outpatient Follow-Up:   Follow-up with PCP in 1 week   Discharge Diagnosis:   Principal Problem:   Acute diverticulitis Active Problems:   GAD (generalized anxiety disorder)   OSA (obstructive sleep apnea)   Chronic diastolic CHF (congestive heart failure) (HCC)   Thrombocytopenia (HCC)   Hypertensive urgency   Chronic pain    Discharge Condition: Stable.  Diet recommendation:  Diet Order            Diet Heart Room service appropriate? Yes; Fluid consistency: Thin  Diet effective now           Diet - low sodium heart healthy                   Code Status: Full Code     Hospital Course:    [name] is a 87 y.o. female (hypertension, chronic diastolic CHF, anxiety, chronic pain, anemia, thrombocytopenia, OSA, presented to the hospital because of nausea, fatigue and abdominal pain.  She was diagnosed with acute sigmoid diverticulitis.  She was treated with analgesics, IV fluids and empiric IV antibiotics.  Her condition improved and she was able to tolerate solid food without any recurrence of symptoms.  She is deemed stable for discharge to home today.      Discharge Exam:    Vitals:   04/29/21 1411 04/29/21 2058 04/30/21 0501 04/30/21 1338  BP:  (!) 145/72 (!) 159/81   Pulse: 64 64 73 72  Resp: 18 16 17    Temp: 98 F (36.7 C) 98.9 F (37.2 C) 98.1 F (36.7 C) 98.6 F (37 C)  TempSrc: Oral Oral Oral   SpO2: 94% (!) 89% 99% 90%     GEN: NAD SKIN: Warm and dry EYES: No pallor or icterus ENT: MMM CV: RRR PULM: CTA B ABD: soft, obese, NT, +BS CNS: AAO x 3, non focal EXT: No edema or tenderness   The results of significant diagnostics from this hospitalization (including imaging, microbiology, ancillary and laboratory)  are listed below for reference.     Procedures and Diagnostic Studies:   CT ABDOMEN PELVIS WO CONTRAST  Result Date: 04/28/2021 CLINICAL DATA:  Weakness with nausea and left-sided abdominal pain for 2 days. EXAM: CT ABDOMEN AND PELVIS WITHOUT CONTRAST TECHNIQUE: Multidetector CT imaging of the abdomen and pelvis was performed following the standard protocol without IV contrast. COMPARISON:  08/25/2018 FINDINGS: Lower chest: Bandlike density in the right lower lobe increased from 08/25/2018, but probably from atelectasis or scarring given the configuration. Mild cardiomegaly. Descending thoracic aortic atherosclerotic calcification. Hepatobiliary: Cholecystectomy.  Otherwise unremarkable. Pancreas: Unremarkable Spleen: Unremarkable Adrenals/Urinary Tract: 3.0 by 1.8 cm right adrenal mass, internal density 39 Hounsfield units. This lesion is unchanged from 08/25/2018. Exophytic 8 mm left kidney upper pole hypodense lesion on image 17 series 2 likely a cyst and not significantly changed from prior. Stomach/Bowel: There is some scattered diverticula of the distal ileum. Descending and sigmoid colon diverticulosis noted with some faint stranding along the sigmoid colon suggesting mild acute diverticulitis. No extraluminal gas or abscess. Vascular/Lymphatic: Aortoiliac atherosclerotic vascular disease. Reproductive: Uterus absent.  Adnexa unremarkable. Other: No supplemental non-categorized findings. Musculoskeletal: Mild grade 1 degenerative anterolisthesis at L4-5 and L5-S1. Fused facet joints at L5-S1 bilaterally. Chondrocalcinosis with degenerative hip arthropathy bilaterally, left greater than right. Small  bilateral groin hernias containing adipose tissue. IMPRESSION: 1. Descending and sigmoid colon diverticulosis with mild sigmoid colon diverticulitis. 2. Scattered diverticula of the distal ileum, without inflammatory findings along the distal ileum. 3. Other imaging findings of potential clinical  significance: Aortic Atherosclerosis (ICD10-I70.0). Mild cardiomegaly. Chronically stable nonspecific right adrenal mass. Bilateral chronic hip arthropathy with chondrocalcinosis. Small bilateral groin hernias contain adipose tissue. Electronically Signed   By: Van Clines M.D.   On: 04/28/2021 14:57     Labs:   Basic Metabolic Panel: Recent Labs  Lab 04/28/21 1804 04/29/21 0347  NA 140 140  K 4.7 3.7  CL 105 105  CO2 27 28  GLUCOSE 87 113*  BUN 10 11  CREATININE 0.74 0.69  CALCIUM 8.9 9.0   GFR CrCl cannot be calculated (Unknown ideal weight.). Liver Function Tests: Recent Labs  Lab 04/28/21 1804 04/29/21 0347  AST 30 13*  ALT 9 10  ALKPHOS 52 51  BILITOT 1.3* 0.9  PROT 6.5 6.2*  ALBUMIN 3.6 3.4*   Recent Labs  Lab 04/28/21 1804  LIPASE 23   No results for input(s): AMMONIA in the last 168 hours. Coagulation profile No results for input(s): INR, PROTIME in the last 168 hours.  CBC: Recent Labs  Lab 04/28/21 1354 04/29/21 0347  WBC 7.2 8.2  NEUTROABS 5.1  --   HGB 10.5* 12.5  HCT 34.6* 42.0  MCV 96.6 97.9  PLT 128* 153   Cardiac Enzymes: No results for input(s): CKTOTAL, CKMB, CKMBINDEX, TROPONINI in the last 168 hours. BNP: Invalid input(s): POCBNP CBG: No results for input(s): GLUCAP in the last 168 hours. D-Dimer No results for input(s): DDIMER in the last 72 hours. Hgb A1c No results for input(s): HGBA1C in the last 72 hours. Lipid Profile No results for input(s): CHOL, HDL, LDLCALC, TRIG, CHOLHDL, LDLDIRECT in the last 72 hours. Thyroid function studies No results for input(s): TSH, T4TOTAL, T3FREE, THYROIDAB in the last 72 hours.  Invalid input(s): FREET3 Anemia work up Recent Labs    04/29/21 0347  VITAMINB12 218  FOLATE 20.2  FERRITIN 8*  TIBC 326  IRON 76  RETICCTPCT 2.0   Microbiology Recent Results (from the past 240 hour(s))  SARS CORONAVIRUS 2 (TAT 6-24 HRS) Nasopharyngeal Nasopharyngeal Swab     Status: None    Collection Time: 04/28/21  8:36 PM   Specimen: Nasopharyngeal Swab  Result Value Ref Range Status   SARS Coronavirus 2 NEGATIVE NEGATIVE Final    Comment: (NOTE) SARS-CoV-2 target nucleic acids are NOT DETECTED.  The SARS-CoV-2 RNA is generally detectable in upper and lower respiratory specimens during the acute phase of infection. Negative results do not preclude SARS-CoV-2 infection, do not rule out co-infections with other pathogens, and should not be used as the sole basis for treatment or other patient management decisions. Negative results must be combined with clinical observations, patient history, and epidemiological information. The expected result is Negative.  Fact Sheet for Patients: SugarRoll.be  Fact Sheet for Healthcare Providers: https://www.woods-mathews.com/  This test is not yet approved or cleared by the [place] FDA and  has been authorized for detection and/or diagnosis of SARS-CoV-2 by FDA under an Emergency Use Authorization (EUA). This EUA will remain  in effect (meaning this test can be used) for the duration of the COVID-19 declaration under Se ction 564(b)(1) of the Act, 21 U.S.C. section 360bbb-3(b)(1), unless the authorization is terminated or revoked sooner.  Performed at [facility], [place] [address], [place], [place] [address]  Discharge Instructions:   Discharge Instructions    Diet - low sodium heart healthy   Complete by: As directed    Increase activity slowly   Complete by: As directed      Allergies as of 04/30/2021      Reactions   Amlodipine Other (See Comments)   Reaction:  Dizziness    Enalapril Cough   Losartan Potassium Other (See Comments)   Reaction:  GI pain    Morphine And Related Itching, Nausea And Vomiting   Metformin Diarrhea      Medication List    STOP taking these medications   HYDROcodone-acetaminophen 5-325 MG tablet Commonly known as:  NORCO/VICODIN     TAKE these medications   albuterol (2.5 MG/3ML) 0.083% nebulizer solution Commonly known as: PROVENTIL INHALE 3 MLS(2.5 MG TOTAL) BY NEBULIZATION TWICE DAILY AS NEEDED FOR WHEEZING OR SHORTNESS OF BREATH   carvedilol 12.5 MG tablet Commonly known as: COREG Take 12.5 mg by mouth 2 (two) times daily with a meal.   Cholecalciferol 50 MCG (2000 UT) Tabs Take 2 tablets (4,000 Units total) by mouth daily.   ciprofloxacin 500 MG tablet Commonly known as: CIPRO Take 1 tablet (500 mg total) by mouth every 12 (twelve) hours for 4 days.   clonazePAM 1 MG tablet Commonly known as: KLONOPIN Take 1 tablet (1 mg total) by mouth daily as needed for anxiety. What changed: See the new instructions.   hydrALAZINE 50 MG tablet Commonly known as: APRESOLINE Take 1 tablet (50 mg total) by mouth 2 (two) times daily.   metroNIDAZOLE 500 MG tablet Commonly known as: FLAGYL Take 1 tablet (500 mg total) by mouth 3 (three) times daily for 4 days.   Myrbetriq 50 MG Tb24 tablet Generic drug: mirabegron ER TAKE 1 TABLET(50 MG) BY MOUTH DAILY What changed: See the new instructions.   omeprazole 40 MG capsule Commonly known as: PRILOSEC TAKE 1 CAPSULE(40 MG) BY MOUTH DAILY What changed: See the new instructions.   spironolactone 25 MG tablet Commonly known as: ALDACTONE Take 0.5 tablets (12.5 mg total) by mouth daily.   torsemide 20 MG tablet Commonly known as: DEMADEX TAKE 1 TABLET(20 MG) BY MOUTH DAILY What changed: See the new instructions.   traZODone 150 MG tablet Commonly known as: DESYREL Take 0.5 tablets (75 mg total) by mouth at bedtime.       Follow-up Information    [name], MD. Schedule an appointment as soon as possible for a visit in 1 week.   Specialty: Internal Medicine Contact information: [place] [address] [phone]                Time coordinating discharge: 28 minutes  Signed:  Jennye Boroughs  [facility] 04/30/2021, 2:10 PM   Pager on [email or web address]. If 7PM-7AM, please contact night-coverage at [email or web address]

## 2021-05-02 ENCOUNTER — Other Ambulatory Visit: Payer: Self-pay

## 2021-05-02 NOTE — Patient Outreach (Signed)
[facility]) Care Management  05/02/2021  [name] [birth year] [id number]   Telephone Assessment Transition of Care Hospital Admission: 6/4-04/30/21 Dx: Acute Diverticulitis PCP Office Does TOC   Outreach attempt # 1 to patient. Spoke with patient who reported she was not feeling well at present and requested a call another day.    Plan: RN CM will make outreach attempt to patient within 3-4 business days.  [name], RN,BSN,CCM [place] Management Telephonic Care Management Coordinator Direct Phone: [phone] Toll Free: [phone] Fax: [phone]

## 2021-05-03 ENCOUNTER — Telehealth: Payer: Self-pay

## 2021-05-03 NOTE — Telephone Encounter (Signed)
Transition Care Management Unsuccessful Follow-up Telephone Call  Date of discharge and from where:  04/30/2021 from [facility]  Attempts:  1st Attempt  Reason for unsuccessful TCM follow-up call:  was able to speak with patient.  Dr. Ronnald Ramp does not have any openings within the next 2 weeks.  Patient declined to see another physician for her hospital follow-up.

## 2021-05-04 ENCOUNTER — Other Ambulatory Visit: Payer: Self-pay

## 2021-05-04 NOTE — Patient Outreach (Signed)
[facility]) Care Management  05/04/2021  AUDREANA HANCOX Oct 19, 1933 [id number]   Telephone Assessment Transition of Care    Outreach attempt # 2 to patient. No answer at present.     Plan: RN CM will make outreach attempt to patient within 3-4 business days.  [name], RN,BSN,CCM [place] Management Telephonic Care Management Coordinator Direct Phone: [phone] Toll Free: [phone] Fax: [phone]

## 2021-05-07 ENCOUNTER — Other Ambulatory Visit: Payer: Self-pay | Admitting: Internal Medicine

## 2021-05-07 DIAGNOSIS — J4520 Mild intermittent asthma, uncomplicated: Secondary | ICD-10-CM

## 2021-05-08 ENCOUNTER — Ambulatory Visit (INDEPENDENT_AMBULATORY_CARE_PROVIDER_SITE_OTHER): Payer: Medicare HMO | Admitting: Internal Medicine

## 2021-05-08 ENCOUNTER — Other Ambulatory Visit: Payer: Self-pay

## 2021-05-08 ENCOUNTER — Encounter: Payer: Self-pay | Admitting: Internal Medicine

## 2021-05-08 DIAGNOSIS — K5792 Diverticulitis of intestine, part unspecified, without perforation or abscess without bleeding: Secondary | ICD-10-CM | POA: Diagnosis not present

## 2021-05-08 DIAGNOSIS — I10 Essential (primary) hypertension: Secondary | ICD-10-CM | POA: Diagnosis not present

## 2021-05-08 MED ORDER — PROMETHAZINE HCL 12.5 MG PO TABS: 12.5000 mg | ORAL_TABLET | Freq: Three times a day (TID) | ORAL | 0 refills | Status: DC | PRN

## 2021-05-08 MED ORDER — AMOXICILLIN-POT CLAVULANATE 875-125 MG PO TABS: 1.0000 | ORAL_TABLET | Freq: Two times a day (BID) | ORAL | 0 refills | Status: AC

## 2021-05-08 NOTE — Patient Outreach (Signed)
[facility]) Care Management  05/08/2021  FEY COGHILL [birth year] [id number]   Telephone Assessment   Unsuccessful outreach attempt to patient.     Plan: RN CM will make outreach attempt to patient within the month of July.  [name], RN,BSN,CCM [place] Management Telephonic Care Management Coordinator Direct Phone: [phone] Toll Free: [phone] Fax: [phone]

## 2021-05-08 NOTE — Patient Instructions (Addendum)
We have sent in promethazine to use for nausea up to 3 times a day.   We have sent in augmentin which is an antibiotic to take 1 pill twice a day for 5 days if you are feeling like you are not getting better or if you are getting worse.   Try pepcid over the counter.

## 2021-05-08 NOTE — Progress Notes (Signed)
   Subjective:   Patient ID: [name], female    DOB: 03-25-1933, 87 y.o.   MRN: [id number]  HPI The patient is an 87 YO female coming in for hospital follow up (admitted for diverticulitis and given 2 days antibiotics and diet advanced). She was prescribed and could not tolerate ciprofloxacin and flagyl. This made her pain worse and nausea. She did stop taking those. She is not feeling well since. She is feeling slightly better today. Denies fevers or chills. Some lower abdomen pain. Some nausea but no vomiting. Eating but not as much.   PMH, [facility], social history reviewed and updated  Review of Systems  Constitutional:  Positive for activity change, appetite change and fatigue.  HENT: Negative.    Eyes: Negative.   Respiratory:  Negative for cough, chest tightness and shortness of breath.   Cardiovascular:  Negative for chest pain, palpitations and leg swelling.  Gastrointestinal:  Positive for abdominal distention, abdominal pain and nausea. Negative for anal bleeding, blood in stool, constipation, diarrhea, rectal pain and vomiting.  Musculoskeletal: Negative.   Skin: Negative.   Neurological: Negative.   Psychiatric/Behavioral: Negative.     Objective:  Physical Exam Constitutional:      Appearance: She is well-developed.  HENT:     Head: Normocephalic and atraumatic.  Cardiovascular:     Rate and Rhythm: Normal rate and regular rhythm.  Pulmonary:     Effort: Pulmonary effort is normal. No respiratory distress.     Breath sounds: Normal breath sounds. No wheezing or rales.  Abdominal:     General: Bowel sounds are normal. There is no distension.     Palpations: Abdomen is soft.     Tenderness: There is abdominal tenderness. There is no rebound.     Comments: Diffuse tenderness, no guarding or rebound  Musculoskeletal:     Cervical back: Normal range of motion.  Skin:    General: Skin is warm and dry.  Neurological:     Mental Status: She is alert and oriented to  person, place, and time.     Coordination: Coordination normal.    Vitals:   05/08/21 1502 05/08/21 1540  BP: (!) 170/100 (!) 170/98  Pulse: 66   Temp: 98 F (36.7 C)   TempSrc: Oral   SpO2: 98%   Weight: 199 lb 12.8 oz (90.6 kg)   Height: 5\' 6"  (1.676 m)     This visit occurred during the SARS-CoV-2 public health emergency.  Safety protocols were in place, including screening questions prior to the visit, additional usage of staff PPE, and extensive cleaning of exam room while observing appropriate contact time as indicated for disinfecting solutions.   Assessment & Plan:

## 2021-05-09 ENCOUNTER — Other Ambulatory Visit: Payer: Self-pay | Admitting: Internal Medicine

## 2021-05-09 ENCOUNTER — Telehealth: Payer: Self-pay | Admitting: Internal Medicine

## 2021-05-09 NOTE — Telephone Encounter (Signed)
   1.Medication Requested:HYDROcodone-acetaminophen (NORCO/VICODIN)   2. Pharmacy (Name, Street, [place]): [facility] [phone] - [place], [place] - [address] AT [place]  3. On Med List: no  4. Last Visit with PCP: 03/20/21  5. Next visit date with PCP: 09/19/21   Agent: Please be advised that RX refills may take up to 3 business days. We ask that you follow-up with your pharmacy.

## 2021-05-10 ENCOUNTER — Other Ambulatory Visit: Payer: Self-pay | Admitting: Internal Medicine

## 2021-05-10 DIAGNOSIS — G894 Chronic pain syndrome: Secondary | ICD-10-CM

## 2021-05-10 DIAGNOSIS — M159 Polyosteoarthritis, unspecified: Secondary | ICD-10-CM

## 2021-05-10 DIAGNOSIS — Z515 Encounter for palliative care: Secondary | ICD-10-CM

## 2021-05-10 MED ORDER — HYDROCODONE-ACETAMINOPHEN 10-325 MG PO TABS: 1.0000 | ORAL_TABLET | Freq: Four times a day (QID) | ORAL | 0 refills | Status: DC | PRN

## 2021-05-10 NOTE — Assessment & Plan Note (Signed)
Not resolved as she was unable to tolerate the antibiotics. Rx augmentin 5 day course. Advised to move diet to soft foods and advance as tolerated. Rx promethazine for nausea.

## 2021-05-10 NOTE — Assessment & Plan Note (Signed)
Moderate exacerbation likely due to pain and illness. She is taking coreg 12.5 mg BID and hydralazine 50 mg BID and spironolactone 12.5 mg daily and torsemide 20 mg daily. Follow up with PCP 2-3 weeks to ensure back to normal.

## 2021-05-11 ENCOUNTER — Other Ambulatory Visit: Payer: Self-pay | Admitting: Internal Medicine

## 2021-05-11 DIAGNOSIS — I10 Essential (primary) hypertension: Secondary | ICD-10-CM

## 2021-05-11 DIAGNOSIS — I5032 Chronic diastolic (congestive) heart failure: Secondary | ICD-10-CM

## 2021-05-14 ENCOUNTER — Ambulatory Visit: Payer: Medicare HMO | Admitting: Pulmonary Disease

## 2021-05-14 ENCOUNTER — Other Ambulatory Visit: Payer: Self-pay

## 2021-05-14 ENCOUNTER — Encounter: Payer: Self-pay | Admitting: Pulmonary Disease

## 2021-05-14 VITALS — BP 126/84 | HR 76 | Temp 98.2°F | Ht 66.0 in | Wt 200.2 lb

## 2021-05-14 DIAGNOSIS — G4734 Idiopathic sleep related nonobstructive alveolar hypoventilation: Secondary | ICD-10-CM

## 2021-05-14 NOTE — Progress Notes (Signed)
Subjective:    Patient ID: [name], female    DOB: 1933-09-27, 87 y.o.   MRN: [id number]  Patient being seen for history of obstructive sleep apnea  She was diagnosed with obstructive sleep apnea and started on CPAP therapy Was not able to get used to CPAP  She was recently hospitalized and did use oxygen while she was in the hospital, use oxygen via nebulization as well  Has sleep study had shown obstructive sleep apnea with significant desaturations, since she is not able to tolerate CPAP, she is wondering whether she may qualify for oxygen supplementation   She does not want to continue trying CPAP as she has not been able to tolerate it at al  Diagnosed with obstructive sleep apnea in 2013  Shortness of breath with moderate exertion  Goes to bed about 10-11 PM Takes about 35 minutes to 40 minutes to fall asleep  Wakes up in the morning about 5 AM, takes her few hours to finally get out of bed sometimes up until 10 AM  She is always tired Sleep quality is described as poor  Past Medical History:  Diagnosis Date   Abdominal pain    Anemia    NOS   Anxiety    Back pain    Bruises easily    Chronic diastolic CHF (congestive heart failure) ([place])    a. 06/2013 EF 65-70%.   Chronic headaches    HISTORY OF    Depression    Diabetes mellitus    type II  DIET CONTROLLED   Diarrhea    Hepatitis    AGE 30S   Hyperlipidemia    Hypertension    Mild aortic stenosis    a. 06/2013 Echo: EF 65-70%, mod LVH with focal basal hypertrophy, very mild AS.   Osteoarthritis    Oxygen desaturation during sleep    USES 2 LITERS BEDTIME   VIA CPAP 06/2012 [facility]    Shortness of breath    WITH EXERTION USES 2 L O2 BEDTIME   Sleep apnea    CPAP WITH O2 2 LITERS 2013 ([facility])   Wears dentures    Wears glasses    Family History  Problem Relation Age of Onset   Cancer Father        prostate cancer   Heart disease Sister    Heart disease Brother    Alcohol abuse Other     Hypertension Other    Kidney disease Other    Mental illness Other    Emphysema Brother    Social History   Socioeconomic History   Marital status: Widowed    Spouse name: Not on file   Number of children: 5   Years of education: Not on file   Highest education level: Not on file  Occupational History   Occupation: retired     Fish farm manager: RETIRED    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[employer] Use   Smoking status: Never   Smokeless tobacco: Never  Vaping Use   Vaping Use: Never used  Substance and Sexual Activity   Alcohol use: No   Drug use: No   Sexual activity: Never  Other Topics Concern   Not on file  Social History Narrative   Not on file   Social Determinants of Health   Financial Resource Strain: Low Risk    Difficulty of Paying Living Expenses: Not hard at all  Food Insecurity: No Food Insecurity   Worried About Running Out of  Food in the Last Year: Never true   [place] in the Last Year: Never true  Transportation Needs: No Transportation Needs   Lack of Transportation (Medical): No   Lack of Transportation (Non-Medical): No  Physical Activity: Sufficiently Active   Days of Exercise per Week: 5 days   Minutes of Exercise per Session: 30 min  Stress: No Stress Concern Present   Feeling of Stress : Not at all  Social Connections: Moderately Integrated   Frequency of Communication with Friends and Family: More than three times a week   Frequency of Social Gatherings with Friends and Family: More than three times a week   Attends Religious Services: More than 4 times per year   Active Member of Clubs or Organizations: No   Attends Music therapist: More than 4 times per year   Marital Status: Widowed  Intimate Partner Violence: Not on file   Review of Systems  Constitutional:  Negative for fever and unexpected weight change.  HENT:  Positive for congestion and trouble swallowing. Negative for dental problem, ear pain, nosebleeds, postnasal  drip, rhinorrhea, sinus pressure, sneezing and sore throat.   Eyes:  Negative for redness and itching.  Respiratory:  Positive for shortness of breath. Negative for cough, chest tightness and wheezing.   Cardiovascular:  Positive for leg swelling. Negative for palpitations.  Gastrointestinal:  Negative for nausea and vomiting.  Genitourinary:  Negative for dysuria.  Musculoskeletal:  Negative for joint swelling.  Skin:  Negative for rash.  Allergic/Immunologic: Negative.  Negative for environmental allergies, food allergies and immunocompromised state.  Neurological:  Negative for headaches.  Hematological:  Bruises/bleeds easily.  Psychiatric/Behavioral:  Negative for dysphoric mood. The patient is nervous/anxious.       Objective:   Physical Exam Constitutional:      Appearance: She is obese.  HENT:     Head: Normocephalic and atraumatic.     Nose: No congestion.     Mouth/Throat:     Mouth: Mucous membranes are moist.     Comments: crowded oropharynx, Mallampati 4 Eyes:     Pupils: Pupils are equal, round, and reactive to light.  Cardiovascular:     Rate and Rhythm: Normal rate and regular rhythm.  Pulmonary:     Effort: Pulmonary effort is normal. No respiratory distress.     Breath sounds: Normal breath sounds. No stridor. No wheezing or rhonchi.  Musculoskeletal:     Cervical back: Normal range of motion and neck supple.  Neurological:     Mental Status: She is alert.  Psychiatric:        Mood and Affect: Mood normal.   Vitals:   05/14/21 1418  BP: 126/84  Pulse: 76  Temp: 98.2 F (36.8 C)  SpO2: 96%   Results of the Epworth flowsheet 11/10/2019  Sitting and reading 1  Watching TV 2  Sitting, inactive in a public place (e.g. a theatre or a meeting) 0  As a passenger in a car for an hour without a break 1  Lying down to rest in the afternoon when circumstances permit 2  Sitting and talking to someone 0  Sitting quietly after a lunch without alcohol 1  In a  car, while stopped for a few minutes in traffic 0  Total score 7      Has not been using CPAP  Assessment & Plan:  .  Nonrestorative sleep  .  Still with some daytime sleepiness  .  Multiple awakenings -secondary  to having to use the bathroom  .  Nocturnal desaturations .  Intolerance to CPAP  .  Hypoxemic respiratory failure with oxygen supplementation piped into CPAP in the past  .  Deconditioning  .  Improvement in symptoms with nebulizer use  Plan: Obtain overnight oximetry to qualify for oxygen supplementation  Encouraged to stay active  Multiple awakenings related to using diuretics  Continue bronchodilators  I will see him back in 3 months

## 2021-05-14 NOTE — Patient Instructions (Signed)
We will order an overnight oximetry -We need to document that the oxygen is going low at night to qualify you for oxygen supplementation  Continue other medications  Nebulization treatment as needed  I will see you in 3 months

## 2021-05-23 DIAGNOSIS — R0683 Snoring: Secondary | ICD-10-CM | POA: Diagnosis not present

## 2021-05-23 DIAGNOSIS — G4730 Sleep apnea, unspecified: Secondary | ICD-10-CM | POA: Diagnosis not present

## 2021-05-29 ENCOUNTER — Other Ambulatory Visit: Payer: Self-pay

## 2021-05-29 NOTE — Patient Outreach (Signed)
[facility]) Care Management  05/29/2021  [name] Jun 06, 1933 [id number]   Telephone Assessment   Successful outreach call placed to patient. Spoke briefly with patient as she reported she was just getting up and trying to get herself together. She denies any acute issues or concerns at this time. She shares that she went to lung MD appt recently and awaiting action plan and next steps form office. Appetite remains WNL for patient. Wgt stable. No falls reported. She denies any RN CM needs or concerns at this time    Medications Reviewed Today     Reviewed by Hayden Pedro, RN (Registered Nurse) on 05/29/21 at [facility] Status: <None>   Medication Order Taking? Sig Documenting Provider Last Dose Status Informant  albuterol (PROVENTIL) (2.5 MG/3ML) 0.083% nebulizer solution [id number] No INHALE CONTENT OF 1 VIAL BY NEBULIZATION TWICE A DAY AS NEEDED FOR WHEEZING OR SHORTNESS OF Cherly Beach, MD Taking Active   carvedilol (COREG) 12.5 MG tablet [id number] No Take 12.5 mg by mouth 2 (two) times daily with a meal. [provider] Taking Active Self  Cholecalciferol 2000 units TABS [id number] No Take 2 tablets (4,000 Units total) by mouth daily. [name], MD Taking Active Self  clonazePAM [facility]) 1 MG tablet [id number] No Take 1 tablet (1 mg total) by mouth daily as needed for anxiety. Jennye Boroughs, MD Taking Active   hydrALAZINE (APRESOLINE) 50 MG tablet [id number] No Take 1 tablet (50 mg total) by mouth 2 (two) times daily. [name], MD Taking Active Self  HYDROcodone-acetaminophen [facility]) 10-325 MG tablet [id number] No Take 1 tablet by mouth every 6 (six) hours as needed. [name], MD Taking Active   MYRBETRIQ 50 MG TB24 tablet [id number] No TAKE 1 TABLET(50 MG) BY MOUTH DAILY  Patient taking differently: Take 50 mg by mouth daily.   [name], MD Taking Active   omeprazole (PRILOSEC) 40 MG capsule  [id number] No TAKE 1 CAPSULE(40 MG) BY MOUTH DAILY  Patient taking differently: Take 40 mg by mouth daily.   [name], MD Taking Active   promethazine (PHENERGAN) 12.5 MG tablet [id number] No Take 1 tablet (12.5 mg total) by mouth every 8 (eight) hours as needed for nausea or vomiting. Hoyt Koch, MD Taking Active   spironolactone (ALDACTONE) 25 MG tablet [id number] No Take 0.5 tablets (12.5 mg total) by mouth daily. Shawna Clamp, MD Taking Active Self  torsemide (DEMADEX) 20 MG tablet [id number] No TAKE 1 TABLET(20 MG) BY MOUTH DAILY [name], MD Taking Active   traZODone (DESYREL) 150 MG tablet [id number] No Take 0.5 tablets (75 mg total) by mouth at bedtime. Jennye Boroughs, MD Taking Active                Goals Addressed               This Visit's Progress     (THN)Track and Manage Fluids and Swelling (pt-stated)        Timeframe:  Long-Range Goal Priority:  High Start Date: 09/04/20                       Expected End Date:  Sept 2022  Follow Up Date Sept 2022  Barriers: Health Behaviors Knowledge    - call office if I gain more than 2 pounds in one day or 5 pounds in one week - keep legs up while sitting - use salt in moderation -  watch for swelling in feet, ankles and legs every day - weigh myself daily    Why is this important?   It is important to check your weight daily and watch how much salt and liquids you have.  It will help you to manage your heart failure.    Notes:  12/27/20-Wgt stable. Patient has lost about 9 lbs. She reports adhering to diuretic regimen. She denies an edema or SOB during this call.  03/28/21-Patient reviewed wgt log with RN CM. She denies any SOB. She has followed up with cardiologist recently.   05/29/21-Patient weighing in the home but not consistently every day. Denies any edema at present. Occasional SOB reported-saw lung MD recently and had testing done.        (THN)Track and Manage My Blood Pressure  (pt-stated)        Timeframe:  Long-Range Goal Priority:  High Start Date:  09/04/20                           Expected End Date:  Sept 2022                    Follow Up Date   Sept2022  Barriers: Health Behaviors Knowledge   - check blood pressure daily - write blood pressure results in a log or diary  -take BP log to all MD appts -call MD for any abnornal BP readings.   Why is this important?   You won't feel high blood pressure, but it can still hurt your blood vessels.  High blood pressure can cause heart or kidney problems. It can also cause a stroke.  Making lifestyle changes like losing a little weight or eating less salt will help.  Checking your blood pressure at home and at different times of the day can help to control blood pressure.  If the doctor prescribes medicine remember to take it the way the doctor ordered.  Call the office if you cannot afford the medicine or if there are questions about it.     Notes: 12/26/20-RN CM did not get to address this call-patient had to end call early  03/28/21-Patient checking BP daily and reviewed Bp log with RN CM during call. BP remains fairly stable. She is adhering to med regimen. She is not at goal BP per MD recommendations.   05/29/21-Did not get to address with pt-pt requested brief call.          Plan: RN CM discussed with patient next outreach within the month of Sept. Patient gave verbal consent and in agreement with RN CM follow up and timeframe. Patient aware that they may contact RN CM sooner for any issues or concerns. RN CM reviewed goals and plan of care with patient. Patient agrees to care plan and follow up.  [name], RN,BSN,CCM [facility] Telephonic Care Management Coordinator Direct Phone: [phone] Toll Free: [phone] Fax: [phone]

## 2021-06-01 ENCOUNTER — Telehealth: Payer: Self-pay | Admitting: Pulmonary Disease

## 2021-06-01 NOTE — Telephone Encounter (Signed)
Noted, thanks!

## 2021-06-01 NOTE — Telephone Encounter (Signed)
Call made to adapt, spoke with [facility]. They are going to fax the results.   Call made to patient, made aware we have called to get the results faxed over and we will get back with her once the provider has reviewed it. Voiced understanding.   Will route message to [facility] nurse to be on look out for ONO results.

## 2021-06-05 NOTE — Telephone Encounter (Signed)
I called and spoke with patient regarding ONO results. I informed patient that we do have the results in Dr. Ander Slade box but he is on vacation this week and will be in the hospital next week but I will send him this message for FYI. Patient verbalized understanding.  Dr. Ander Slade, just FYI that ONO results in your folder. Thanks!

## 2021-06-06 ENCOUNTER — Telehealth: Payer: Self-pay | Admitting: Internal Medicine

## 2021-06-06 NOTE — Telephone Encounter (Signed)
  Patient is requesting refill for  HYDROcodone-acetaminophen (NORCO) 10-325 MG tablet  [facility] [address] [place]

## 2021-06-07 ENCOUNTER — Other Ambulatory Visit: Payer: Self-pay | Admitting: Internal Medicine

## 2021-06-07 DIAGNOSIS — G894 Chronic pain syndrome: Secondary | ICD-10-CM

## 2021-06-07 DIAGNOSIS — Z515 Encounter for palliative care: Secondary | ICD-10-CM

## 2021-06-07 DIAGNOSIS — M159 Polyosteoarthritis, unspecified: Secondary | ICD-10-CM

## 2021-06-07 DIAGNOSIS — M8949 Other hypertrophic osteoarthropathy, multiple sites: Secondary | ICD-10-CM

## 2021-06-07 MED ORDER — HYDROCODONE-ACETAMINOPHEN 10-325 MG PO TABS: 1.0000 | ORAL_TABLET | Freq: Four times a day (QID) | ORAL | 0 refills | Status: DC | PRN

## 2021-06-11 ENCOUNTER — Telehealth: Payer: Self-pay | Admitting: Internal Medicine

## 2021-06-11 NOTE — Telephone Encounter (Signed)
Pt returning a phone call about ONO results. Pt can be reached at [phone]

## 2021-06-11 NOTE — Telephone Encounter (Signed)
Patient states on Saturday she noticed "knots" on skin. Neck area, stomach, legs. Some itching, no redness Patient declined appointment at other locations.   Please advise

## 2021-06-11 NOTE — Telephone Encounter (Signed)
I have spoke to pt for additional information. She stated that she has not tried anything new. No new foods, lotions, sprays/perfume, laundry detergents, etc. Please advise if you would like to be scheduled in a buffer?

## 2021-06-12 NOTE — Telephone Encounter (Signed)
Called and spoke with patient, let patient know that Dr. Ander Slade is working in the hospital this week and once he has a chance to review the ONO we will call her and give her the results and recommendations.  She verbalized understanding.

## 2021-06-12 NOTE — Telephone Encounter (Signed)
Will forward to triage to locate these results and see if DOD or APP can result the ONO as this is time sensitive if pt needs O2.

## 2021-06-13 NOTE — Telephone Encounter (Signed)
Can have anyone in the office review the ONO please

## 2021-06-13 NOTE — Telephone Encounter (Signed)
Pt called in still awaiting results from test . Please advise [phone]

## 2021-06-13 NOTE — Telephone Encounter (Signed)
Spoke with pt and notified her ONO results have not been read yet. Pt stated undrstanding.

## 2021-06-13 NOTE — Telephone Encounter (Signed)
Dr. Annamaria Boots could you please read ONO for Dr. Ander Slade?

## 2021-06-13 NOTE — Telephone Encounter (Signed)
Result of ONOX is not charted for me to acces- may be in a mail pile for Dr Ander Slade. If you can find it for me, I will be happy to report result tomorrow

## 2021-06-15 ENCOUNTER — Telehealth: Payer: Self-pay | Admitting: Pharmacist

## 2021-06-15 NOTE — Chronic Care Management (AMB) (Signed)
Chronic Care Management Pharmacy Assistant   Name: [name]  MRN: [id number] DOB: [birth year]   Reason for Encounter: Disease State - Hypertension    Recent office visits:  05/08/21 Pricilla Holm, MD (PCP) - Acute diverticulitis - Amoxicillin- Pot Clavulanate 975-125 mg prescribed, Promethazine 12.5 mg prescribed, Soft foods and counseling on diet,  Try Pepcid OTC, Follow up as needed.   Recent consult visits: Overnight Oximetry  ordered, no medication changes,  05/14/21 Weston Brass MD, Pulmonology - Follow up 3 months.   Hospital visits:  Medication Reconciliation was completed by comparing discharge summary, patient's EMR and Pharmacy list, and upon discussion with patient.  Admitted to the hospital on 04/29/21 due to Acute diverticulitis. Discharge date was 04/30/21. Discharged from [facility]?Medications Started at [facility] Discharge:?? No new medications noted.   Medication Changes at Hospital Discharge: No medication changes noted.   Medications Discontinued at Hospital Discharge: No medications discontinued noted.   Medications that remain the same after Hospital Discharge:??  -All other medications will remain the same.    Medications: Outpatient Encounter Medications as of 06/15/2021  Medication Sig   albuterol (PROVENTIL) (2.5 MG/3ML) 0.083% nebulizer solution INHALE CONTENT OF 1 VIAL BY NEBULIZATION TWICE A DAY AS NEEDED FOR WHEEZING OR SHORTNESS OF BREATH   carvedilol (COREG) 12.5 MG tablet Take 12.5 mg by mouth 2 (two) times daily with a meal.   Cholecalciferol 2000 units TABS Take 2 tablets (4,000 Units total) by mouth daily.   clonazePAM (KLONOPIN) 1 MG tablet Take 1 tablet (1 mg total) by mouth daily as needed for anxiety.   hydrALAZINE (APRESOLINE) 50 MG tablet Take 1 tablet (50 mg total) by mouth 2 (two) times daily.   HYDROcodone-acetaminophen (NORCO) 10-325 MG tablet Take 1 tablet by mouth every 6 (six) hours as needed.    MYRBETRIQ 50 MG TB24 tablet TAKE 1 TABLET(50 MG) BY MOUTH DAILY (Patient taking differently: Take 50 mg by mouth daily.)   omeprazole (PRILOSEC) 40 MG capsule TAKE 1 CAPSULE(40 MG) BY MOUTH DAILY (Patient taking differently: Take 40 mg by mouth daily.)   promethazine (PHENERGAN) 12.5 MG tablet Take 1 tablet (12.5 mg total) by mouth every 8 (eight) hours as needed for nausea or vomiting.   spironolactone (ALDACTONE) 25 MG tablet Take 0.5 tablets (12.5 mg total) by mouth daily.   torsemide (DEMADEX) 20 MG tablet TAKE 1 TABLET(20 MG) BY MOUTH DAILY   traZODone (DESYREL) 150 MG tablet Take 0.5 tablets (75 mg total) by mouth at bedtime.   No facility-administered encounter medications on file as of 06/15/2021.    Reviewed chart prior to disease state call. Spoke with patient regarding BP  Recent Office Vitals: BP Readings from Last 3 Encounters:  05/14/21 126/84  05/08/21 (!) 170/98  04/30/21 (!) 158/78   Pulse Readings from Last 3 Encounters:  05/14/21 76  05/08/21 66  04/30/21 70    Wt Readings from Last 3 Encounters:  05/14/21 200 lb 3.2 oz (90.8 kg)  05/08/21 199 lb 12.8 oz (90.6 kg)  03/20/21 204 lb (92.5 kg)     Kidney Function Lab Results  Component Value Date/Time   CREATININE 0.69 04/29/2021 03:47 AM   CREATININE 0.74 04/28/2021 06:04 PM   GFR 65.88 03/20/2021 01:44 PM   GFRNONAA >60 04/29/2021 03:47 AM   GFRAA >60 08/13/2020 05:47 AM    BMP Latest Ref Rng & Units 04/29/2021 04/28/2021 03/20/2021  Glucose 70 - 99 mg/dL 113(H) 87 84  BUN  8 - 23 mg/dL '11 10 19  '$ Creatinine 0.44 - 1.00 mg/dL 0.69 0.74 0.81  BUN/Creat Ratio 12 - 28 - - -  Sodium 135 - 145 mmol/L 140 140 139  Potassium 3.5 - 5.1 mmol/L 3.7 4.7 4.2  Chloride 98 - 111 mmol/L 105 105 101  CO2 22 - 32 mmol/L 28 27 33(H)  Calcium 8.9 - 10.3 mg/dL 9.0 8.9 9.7    Current antihypertensive regimen:  Hydralazine 50 mg 1 tab two times daily Spironolactone 25 mg 0.5 mg daily Torsemide 20 mg 1 tab daily  How often  are you checking your Blood Pressure? daily. Patient states she sometimes checks her blood pressure twice daily.   Current home BP readings: 142/70 on 06/19/21  What recent interventions/DTPs have been made by any provider to improve Blood Pressure control since last CPP Visit: Patient denies any recent medication or therapy changes.   Any recent hospitalizations or ED visits since last visit with CPP? Yes   What diet changes have been made to improve Blood Pressure Control?  Patient reports she has been working on her salt intake and has been working on weight loss recently.   What exercise is being done to improve your Blood Pressure Control?  Patient reports she is still pretty active for her age. She is able to do most of her own activities of daily living. She does her own laundry and does drive.   Adherence Review: ACE/ARB medication? No Does the patient have >5 day gap between last estimated fill dates? Yes (see note below)  Medication Adherence Review:  Hydralazine 50 mg 1 tab two times daily - last filled 05/27/21 90 days  (patient reported dated read off her bottle at home) Spironolactone 25 mg 0.5 mg daily - last filled 03/11/21 30 days  (Patient states she has been taking some she had on hand at home when she wasn't taking the medicine at first but is taking consistently now) Torsemide 20 mg 1 tab daily - last filled 05/11/21 90 days   Star Rating Drugs: None noted.    Jobe Gibbon, [place] Pharmacist Assistant  [phone]  Time Spent: 40 minutes

## 2021-06-18 ENCOUNTER — Telehealth: Payer: Self-pay | Admitting: Pulmonary Disease

## 2021-06-18 DIAGNOSIS — G4734 Idiopathic sleep related nonobstructive alveolar hypoventilation: Secondary | ICD-10-CM

## 2021-06-18 NOTE — Telephone Encounter (Signed)
ONO located in Dr. Judson Roch box and given to Dr. Annamaria Boots for review.

## 2021-06-18 NOTE — Telephone Encounter (Signed)
Note Overnight oximetry report from 05/23/21 on room air shows sustained low oxygen saturation 88% or less for 5 hours and 29 minutes, confirming significant nocturnal hypoxemia.   Order- DME home O2 2L for sleep     Dx Nocturnal Hypoxemia   Patient will continue to be followed by Dr Ander Slade.     Spoke with the pt and notified of results/recs  She verbalized undertanding  Order for o2 placed

## 2021-06-18 NOTE — Telephone Encounter (Signed)
Pt is calling to obtain her oximetry results stated nobody ever got back to her. Looks like Dr. Annamaria Boots did interpret results on 06/01/2021 phone note encounter.   Stated;  "Overnight oximetry report from 05/23/21 on room air shows sustained low oxygen saturation 88% or less for 5 hours and 29 minutes, confirming significant nocturnal hypoxemia.   Order- DME home O2 2L for sleep     Dx Nocturnal Hypoxemia   Patient will continue to be followed by Dr Ander Slade."  Pt just needs someone to go over with her. Thanks!  Pls regard; [phone]

## 2021-06-18 NOTE — Telephone Encounter (Signed)
Overnight oximetry report from 05/23/21 on room air shows sustained low oxygen saturation 88% or less for 5 hours and 29 minutes, confirming significant nocturnal hypoxemia.  Order- DME home O2 2L for sleep     Dx Nocturnal Hypoxemia  Patient will continue to be followed by Dr Ander Slade.

## 2021-06-20 DIAGNOSIS — G4733 Obstructive sleep apnea (adult) (pediatric): Secondary | ICD-10-CM | POA: Diagnosis not present

## 2021-06-20 NOTE — Telephone Encounter (Signed)
See encounter from 06/18/21. Will close this note.

## 2021-07-05 ENCOUNTER — Telehealth: Payer: Self-pay | Admitting: Internal Medicine

## 2021-07-05 NOTE — Telephone Encounter (Signed)
   Patient calling to request refill for HYDROcodone-acetaminophen [facility]) 10-325 MG tablet   Pharmacy [facility] [place], [address]

## 2021-07-07 ENCOUNTER — Other Ambulatory Visit: Payer: Self-pay | Admitting: Internal Medicine

## 2021-07-07 DIAGNOSIS — M8949 Other hypertrophic osteoarthropathy, multiple sites: Secondary | ICD-10-CM

## 2021-07-07 DIAGNOSIS — G894 Chronic pain syndrome: Secondary | ICD-10-CM

## 2021-07-07 DIAGNOSIS — Z515 Encounter for palliative care: Secondary | ICD-10-CM

## 2021-07-07 DIAGNOSIS — M159 Polyosteoarthritis, unspecified: Secondary | ICD-10-CM

## 2021-07-07 MED ORDER — HYDROCODONE-ACETAMINOPHEN 10-325 MG PO TABS: 1.0000 | ORAL_TABLET | Freq: Four times a day (QID) | ORAL | 0 refills | Status: DC | PRN

## 2021-07-11 DIAGNOSIS — G4733 Obstructive sleep apnea (adult) (pediatric): Secondary | ICD-10-CM | POA: Diagnosis not present

## 2021-07-16 ENCOUNTER — Other Ambulatory Visit: Payer: Self-pay | Admitting: Internal Medicine

## 2021-07-21 ENCOUNTER — Other Ambulatory Visit: Payer: Self-pay | Admitting: Internal Medicine

## 2021-07-21 DIAGNOSIS — J4520 Mild intermittent asthma, uncomplicated: Secondary | ICD-10-CM

## 2021-07-21 DIAGNOSIS — G4733 Obstructive sleep apnea (adult) (pediatric): Secondary | ICD-10-CM | POA: Diagnosis not present

## 2021-08-02 ENCOUNTER — Encounter (HOSPITAL_BASED_OUTPATIENT_CLINIC_OR_DEPARTMENT_OTHER): Payer: Self-pay | Admitting: Cardiology

## 2021-08-02 ENCOUNTER — Other Ambulatory Visit: Payer: Self-pay

## 2021-08-02 ENCOUNTER — Ambulatory Visit (HOSPITAL_BASED_OUTPATIENT_CLINIC_OR_DEPARTMENT_OTHER): Payer: Medicare HMO | Admitting: Cardiology

## 2021-08-02 VITALS — BP 130/70 | HR 57 | Ht 66.0 in | Wt 198.0 lb

## 2021-08-02 DIAGNOSIS — I350 Nonrheumatic aortic (valve) stenosis: Secondary | ICD-10-CM | POA: Diagnosis not present

## 2021-08-02 DIAGNOSIS — I5032 Chronic diastolic (congestive) heart failure: Secondary | ICD-10-CM

## 2021-08-02 DIAGNOSIS — I10 Essential (primary) hypertension: Secondary | ICD-10-CM

## 2021-08-02 DIAGNOSIS — Z7189 Other specified counseling: Secondary | ICD-10-CM

## 2021-08-02 NOTE — Progress Notes (Signed)
Cardiology Office Note:    Date:  08/02/2021   ID:  [name], DOB 22-Oct-1933, MRN [id number]  PCP:  [name], MD  Cardiologist:  [name], MD   Referring MD: [name], MD   CC: follow up  History of Present Illness:    [name] is a 87 y.o. female with a hx of hypertension who is seen for follow up today. I initially met her 07/16/19 as a new consult at the request of [name], MD for the evaluation and management of aortic stenosis and concern for diastolic heart failure.  Today: Doing well overall. Tolerating all medications. Breathing is improved. Uses O2 at night, takes a breathing treatment right before she goes to bed and when she wakes up in the morning. With this regimen she has no issues.   No issues with swelling. Blood pressure well controlled today, reports no recent elevated readings except for one time when she was off of her medication regimen due to traveling. Reviewed most recent blood work.   Denies chest pain, shortness of breath at rest. No PND, orthopnea, LE edema or unexpected weight gain. No syncope or palpitations. Occasional dizziness. No falls. Walks with a cane, takes her time changing position.  Past Medical History:  Diagnosis Date   Abdominal pain    Anemia    NOS   Anxiety    Back pain    Bruises easily    Chronic diastolic CHF (congestive heart failure) ([place])    a. 06/2013 EF 65-70%.   Chronic headaches    HISTORY OF    Depression    Diabetes mellitus    type II  DIET CONTROLLED   Diarrhea    Hepatitis    AGE 30S   Hyperlipidemia    Hypertension    Mild aortic stenosis    a. 06/2013 Echo: EF 65-70%, mod LVH with focal basal hypertrophy, very mild AS.   Osteoarthritis    Oxygen desaturation during sleep    USES 2 LITERS BEDTIME   VIA CPAP 06/2012 [facility]    Shortness of breath    WITH EXERTION USES 2 L O2 BEDTIME   Sleep apnea    CPAP WITH O2 2 LITERS 2013 ([facility])   Wears dentures    Wears  glasses     Past Surgical History:  Procedure Laterality Date   ABDOMINAL HYSTERECTOMY     APPENDECTOMY     CHOLECYSTECTOMY     JOINT REPLACEMENT     LEFT KNEE    KNEE ARTHROPLASTY Right 03/22/2015   Procedure: COMPUTER ASSISTED TOTAL KNEE ARTHROPLASTY;  Surgeon: Marybelle Killings, MD;  Location: [place];  Service: Orthopedics;  Laterality: Right;   KNEE ARTHROSCOPY     RIGHT   LEFT AND RIGHT HEART CATHETERIZATION WITH CORONARY ANGIOGRAM N/A 10/28/2013   Procedure: LEFT AND RIGHT HEART CATHETERIZATION WITH CORONARY ANGIOGRAM;  Surgeon: Blane Ohara, MD;  Location: [facility] CATH LAB;  Service: Cardiovascular;  Laterality: N/A;   LEFT HEART CATHETERIZATION WITH CORONARY ANGIOGRAM N/A 11/14/2014   Procedure: LEFT HEART CATHETERIZATION WITH CORONARY ANGIOGRAM;  Surgeon: Josue Hector, MD;  Location: [facility] CATH LAB;  Service: Cardiovascular;  Laterality: N/A;   REVISION TOTAL KNEE ARTHROPLASTY  2011   ROTATOR CUFF REPAIR  2010   SHOULDER ARTHROSCOPY  09/17/2012   Procedure: ARTHROSCOPY SHOULDER;  Surgeon: Sharmon Revere, MD;  Location: [place];  Service: Orthopedics;  Laterality: Left;   TONSILLECTOMY  TONSILLECTOMY      Current Medications: Current Outpatient Medications on File Prior to Visit  Medication Sig   albuterol (PROVENTIL) (2.5 MG/3ML) 0.083% nebulizer solution INHALE THE CONTENTS OF 1 VIAL VIA NEBULIZER TWICE DAILY AS NEEDED FOR WHEEZING OR SHORTNESS OF BREATH   carvedilol (COREG) 12.5 MG tablet Take 12.5 mg by mouth 2 (two) times daily with a meal.   Cholecalciferol 2000 units TABS Take 2 tablets (4,000 Units total) by mouth daily.   clonazePAM (KLONOPIN) 1 MG tablet Take 1 tablet (1 mg total) by mouth daily as needed for anxiety.   hydrALAZINE (APRESOLINE) 50 MG tablet Take 1 tablet (50 mg total) by mouth 2 (two) times daily.   HYDROcodone-acetaminophen (NORCO) 10-325 MG tablet Take 1 tablet by mouth every 6 (six) hours as needed.   MYRBETRIQ 50 MG TB24 tablet TAKE 1 TABLET(50 MG) BY  MOUTH DAILY   omeprazole (PRILOSEC) 40 MG capsule TAKE 1 CAPSULE(40 MG) BY MOUTH DAILY   promethazine (PHENERGAN) 12.5 MG tablet TAKE 1 TABLET(12.5 MG) BY MOUTH EVERY 8 HOURS AS NEEDED FOR NAUSEA OR VOMITING   spironolactone (ALDACTONE) 25 MG tablet Take 0.5 tablets (12.5 mg total) by mouth daily.   torsemide (DEMADEX) 20 MG tablet TAKE 1 TABLET(20 MG) BY MOUTH DAILY   traZODone (DESYREL) 150 MG tablet Take 0.5 tablets (75 mg total) by mouth at bedtime.   No current facility-administered medications on file prior to visit.     Allergies:   Amlodipine, Enalapril, Losartan potassium, Morphine and related, and Metformin   Social History   Tobacco Use   Smoking status: Never   Smokeless tobacco: Never  Vaping Use   Vaping Use: Never used  Substance Use Topics   Alcohol use: No   Drug use: No    Family History: The patient's family history includes Alcohol abuse in an other family member; Cancer in her father; Emphysema in her brother; Heart disease in her brother and sister; Hypertension in an other family member; Kidney disease in an other family member; Mental illness in an other family member.  ROS:   Please see the history of present illness.  Additional pertinent ROS otherwise unremarkable  EKGs/Labs/Other Studies Reviewed:    The following studies were reviewed today: Echo 07/22/19  1. The left ventricle has normal systolic function with an ejection fraction of 60-65%. The cavity size was normal. Severe basal septal hypertrophy. Left ventricular diastolic Doppler parameters are consistent with pseudonormalization.  2. The right ventricle has normal systolic function. The cavity was normal. There is no increase in right ventricular wall thickness. Right ventricular systolic pressure could not be assessed.  3. There is mild mitral annular calcification present.  4. The aortic valve is tricuspid. Moderate calcification of the aortic valve. Aortic valve regurgitation is trivial by  color flow Doppler.  5. The aorta is normal unless otherwise noted.  6. The interatrial septum appears to be lipomatous.  EKG:  EKG is personally reviewed.   08/02/21 sinus bradycardia at 57 bpm, unchanged ST/T pattern 08/11/20 sinus rhythm at 67 bpm, nonspecific ST/T pattern anterolateral leads  Recent Labs: 08/12/2020: B Natriuretic Peptide 109.4 08/13/2020: Magnesium 1.6 04/29/2021: ALT 10; BUN 11; Creatinine, Ser 0.69; Hemoglobin 12.5; Platelets 153; Potassium 3.7; Sodium 140  Recent Lipid Panel    Component Value Date/Time   CHOL 186 04/10/2020 1403   TRIG 110.0 04/10/2020 1403   HDL 57.90 04/10/2020 1403   CHOLHDL 3 04/10/2020 1403   VLDL 22.0 04/10/2020 1403   LDLCALC 106 (  H) 04/10/2020 1403   LDLDIRECT 121.0 06/12/2017 1349    Physical Exam:    VS:  BP 130/70   Pulse (!) 57   Ht _0  (1.676 m)   Wt 198 lb (89.8 kg)   SpO2 95%   BMI 31.96 kg/m    Wt Readings from Last 3 Encounters:  08/02/21 198 lb (89.8 kg)  05/14/21 200 lb 3.2 oz (90.8 kg)  05/08/21 199 lb 12.8 oz (90.6 kg)   GEN: Well nourished, well developed in no acute distress HEENT: Normal, moist mucous membranes NECK: No JVD CARDIAC: regular rhythm, normal S1 and S2, no rubs or gallops. 2/6 systolic murmur. VASCULAR: Radial and DP pulses 2+ bilaterally. No carotid bruits RESPIRATORY:  Clear to auscultation without rales, wheezing or rhonchi  ABDOMEN: Soft, non-tender, non-distended MUSCULOSKELETAL:  Ambulates independently SKIN: Warm and dry, no edema NEUROLOGIC:  Alert and oriented x 3. No focal neuro deficits noted. PSYCHIATRIC:  Normal affect    ASSESSMENT:    1. Chronic diastolic CHF (congestive heart failure) ([place])   2. Essential hypertension   3. Aortic stenosis, mild   4. Encounter for education about heart failure   5. Cardiac risk counseling     PLAN:    Chronic diastolic heart failure:  -appears euvolemic on exam -reviewed heart failure education, including daily weights, salt  avoidance, fluid restrictions -doing much better on daily torsemide vs PRN, continue daily  Hypertension: prior hospitalization for hypertensive urgency 07/2020 -continue carvedilol 12.5 mg BID. Avoid bradycardia due to diastolic dysfunction -continue spirinolactone 12.5 mg daily -continue torsemide 20 mg daily -continue hydralazine 50 mg BID -cannot tolerate amlodipine, ACEi/ARB, see allergies  Murmur: aortic sclerosis with mild stenosis. Unchanged on exam today  Plan for follow up: 6 mos or sooner as neded  [name], MD, PhD, [facility]   Medication Adjustments/Labs and Tests Ordered: Current medicines are reviewed at length with the patient today.  Concerns regarding medicines are outlined above.  Orders Placed This Encounter  Procedures   EKG 12-Lead    No orders of the defined types were placed in this encounter.   Patient Instructions  Medication Instructions:  Your Physician recommend you continue on your current medication as directed.    *If you need a refill on your cardiac medications before your next appointment, please call your pharmacy*   Lab Work: None ordered today   Testing/Procedures: None ordered today   Follow-Up: At [facility], you and your health needs are our priority.  As part of our continuing mission to provide you with exceptional heart care, we have created designated Provider Care Teams.  These Care Teams include your primary Cardiologist (physician) and Advanced Practice Providers (APPs -  Physician Assistants and Nurse Practitioners) who all work together to provide you with the care you need, when you need it.  We recommend signing up for the patient portal called "MyChart".  Sign up information is provided on this After Visit Summary.  MyChart is used to connect with patients for Virtual Visits (Telemedicine).  Patients are able to view lab/test results, encounter notes, upcoming appointments, etc.   Non-urgent messages can be sent to your provider as well.   To learn more about what you can do with MyChart, go to [email or web address].    Your next appointment:   6 month(s)  The format for your next appointment:   In Person  Provider:   [name], MD      Signed, [name],  MD PhD 08/02/2021     [place]

## 2021-08-02 NOTE — Patient Instructions (Signed)

## 2021-08-03 ENCOUNTER — Telehealth: Payer: Self-pay

## 2021-08-03 DIAGNOSIS — Z515 Encounter for palliative care: Secondary | ICD-10-CM

## 2021-08-03 DIAGNOSIS — M159 Polyosteoarthritis, unspecified: Secondary | ICD-10-CM

## 2021-08-03 DIAGNOSIS — G894 Chronic pain syndrome: Secondary | ICD-10-CM

## 2021-08-03 DIAGNOSIS — M8949 Other hypertrophic osteoarthropathy, multiple sites: Secondary | ICD-10-CM

## 2021-08-03 MED ORDER — HYDROCODONE-ACETAMINOPHEN 10-325 MG PO TABS: 1.0000 | ORAL_TABLET | Freq: Four times a day (QID) | ORAL | 0 refills | Status: DC | PRN

## 2021-08-03 NOTE — Telephone Encounter (Signed)
Please advise as the pt has stated she needs a rx refill for her HYDROcodone-acetaminophen (NORCO) 10-325 MG tablet

## 2021-08-06 ENCOUNTER — Encounter: Payer: Self-pay | Admitting: Pulmonary Disease

## 2021-08-06 ENCOUNTER — Ambulatory Visit: Payer: Medicare HMO | Admitting: Pulmonary Disease

## 2021-08-06 ENCOUNTER — Other Ambulatory Visit: Payer: Self-pay

## 2021-08-06 ENCOUNTER — Ambulatory Visit (INDEPENDENT_AMBULATORY_CARE_PROVIDER_SITE_OTHER): Payer: Medicare HMO | Admitting: Pharmacist

## 2021-08-06 VITALS — BP 118/70 | HR 64 | Temp 97.6°F | Ht 62.0 in | Wt 195.8 lb

## 2021-08-06 DIAGNOSIS — F411 Generalized anxiety disorder: Secondary | ICD-10-CM

## 2021-08-06 DIAGNOSIS — G4700 Insomnia, unspecified: Secondary | ICD-10-CM

## 2021-08-06 DIAGNOSIS — I10 Essential (primary) hypertension: Secondary | ICD-10-CM

## 2021-08-06 DIAGNOSIS — J4520 Mild intermittent asthma, uncomplicated: Secondary | ICD-10-CM

## 2021-08-06 DIAGNOSIS — M8949 Other hypertrophic osteoarthropathy, multiple sites: Secondary | ICD-10-CM

## 2021-08-06 DIAGNOSIS — I5032 Chronic diastolic (congestive) heart failure: Secondary | ICD-10-CM

## 2021-08-06 DIAGNOSIS — G4734 Idiopathic sleep related nonobstructive alveolar hypoventilation: Secondary | ICD-10-CM

## 2021-08-06 DIAGNOSIS — R0602 Shortness of breath: Secondary | ICD-10-CM

## 2021-08-06 DIAGNOSIS — N3281 Overactive bladder: Secondary | ICD-10-CM

## 2021-08-06 DIAGNOSIS — G4730 Sleep apnea, unspecified: Secondary | ICD-10-CM

## 2021-08-06 DIAGNOSIS — M159 Polyosteoarthritis, unspecified: Secondary | ICD-10-CM

## 2021-08-06 MED ORDER — ALBUTEROL SULFATE (2.5 MG/3ML) 0.083% IN NEBU: INHALATION_SOLUTION | RESPIRATORY_TRACT | 5 refills | Status: DC

## 2021-08-06 MED ORDER — TRAZODONE HCL 100 MG PO TABS: 100.0000 mg | ORAL_TABLET | Freq: Every day | ORAL | 0 refills | Status: DC

## 2021-08-06 MED ORDER — SPIRONOLACTONE 25 MG PO TABS: 12.5000 mg | ORAL_TABLET | Freq: Every day | ORAL | 0 refills | Status: DC

## 2021-08-06 NOTE — Progress Notes (Signed)
Subjective:    Patient ID: [name], female    DOB: 05-Feb-1933, 87 y.o.   MRN: [id number]  Patient being seen for history of obstructive sleep apnea  She was diagnosed with obstructive sleep apnea and started on CPAP therapy Was not able to get used to CPAP  Had an overnight oximetry performed, 2 L of oxygen recommended, has not been using it  Has sleep study had shown obstructive sleep apnea with significant desaturations, since she is not able to tolerate CPAP, she is wondering whether she may qualify for oxygen supplementation  Discontinued CPAP use because she could not get used to it  Diagnosed with obstructive sleep apnea in 2013  Shortness of breath with moderate exertion  Goes to bed about 10-11 PM Takes about 35 minutes to 40 minutes to fall asleep  Wakes up in the morning about 5 AM, takes her few hours to finally get out of bed sometimes up until 10 AM  She is always tired Sleep quality is described as poor  Past Medical History:  Diagnosis Date   Abdominal pain    Anemia    NOS   Anxiety    Back pain    Bruises easily    Chronic diastolic CHF (congestive heart failure) ([place])    a. 06/2013 EF 65-70%.   Chronic headaches    HISTORY OF    Depression    Diabetes mellitus    type II  DIET CONTROLLED   Diarrhea    Hepatitis    AGE 30S   Hyperlipidemia    Hypertension    Mild aortic stenosis    a. 06/2013 Echo: EF 65-70%, mod LVH with focal basal hypertrophy, very mild AS.   Osteoarthritis    Oxygen desaturation during sleep    USES 2 LITERS BEDTIME   VIA CPAP [facility]/2012 [facility]    Shortness of breath    WITH EXERTION USES 2 L O2 BEDTIME   Sleep apnea    CPAP WITH O2 2 LITERS 2013 ([facility])   Wears dentures    Wears glasses    Family History  Problem Relation Age of Onset   Cancer Father        prostate cancer   Heart disease Sister    Heart disease Brother    Alcohol abuse Other    Hypertension Other    Kidney disease Other    Mental  illness Other    Emphysema Brother    Social History   Socioeconomic History   Marital status: Widowed    Spouse name: Not on file   Number of children: 5   Years of education: Not on file   Highest education level: Not on file  Occupational History   Occupation: retired     Fish farm manager: RETIRED    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[place] Use   Smoking status: Never   Smokeless tobacco: Never  Vaping Use   Vaping Use: Never used  Substance and Sexual Activity   Alcohol use: No   Drug use: No   Sexual activity: Never  Other Topics Concern   Not on file  Social History Narrative   Not on file   Social Determinants of Health   Financial Resource Strain: Low Risk    Difficulty of Paying Living Expenses: Not hard at all  Food Insecurity: No Food Insecurity   Worried About Charity fundraiser in the Last Year: Never true   Arboriculturist in  the Last Year: Never true  Transportation Needs: No Transportation Needs   Lack of Transportation (Medical): No   Lack of Transportation (Non-Medical): No  Physical Activity: Sufficiently Active   Days of Exercise per Week: 5 days   Minutes of Exercise per Session: 30 min  Stress: No Stress Concern Present   Feeling of Stress : Not at all  Social Connections: Moderately Integrated   Frequency of Communication with Friends and Family: More than three times a week   Frequency of Social Gatherings with Friends and Family: More than three times a week   Attends Religious Services: More than 4 times per year   Active Member of Clubs or Organizations: No   Attends Music therapist: More than 4 times per year   Marital Status: Widowed  Intimate Partner Violence: Not on file   Review of Systems  Constitutional:  Negative for fever and unexpected weight change.  HENT:  Positive for congestion and trouble swallowing. Negative for dental problem, ear pain, nosebleeds, postnasal drip, rhinorrhea, sinus pressure, sneezing and sore  throat.   Eyes:  Negative for redness and itching.  Respiratory:  Positive for shortness of breath. Negative for cough, chest tightness and wheezing.   Cardiovascular:  Positive for leg swelling. Negative for palpitations.  Gastrointestinal:  Negative for nausea and vomiting.  Genitourinary:  Negative for dysuria.  Musculoskeletal:  Negative for joint swelling.  Skin:  Negative for rash.  Allergic/Immunologic: Negative.  Negative for environmental allergies, food allergies and immunocompromised state.  Neurological:  Negative for headaches.  Hematological:  Bruises/bleeds easily.  Psychiatric/Behavioral:  Negative for dysphoric mood. The patient is nervous/anxious.       Objective:   Physical Exam Constitutional:      Appearance: She is obese.  HENT:     Head: Normocephalic and atraumatic.     Nose: No congestion.  Eyes:     Pupils: Pupils are equal, round, and reactive to light.  Cardiovascular:     Rate and Rhythm: Normal rate and regular rhythm.  Pulmonary:     Effort: Pulmonary effort is normal. No respiratory distress.     Breath sounds: Normal breath sounds. No stridor. No wheezing or rhonchi.  Musculoskeletal:     Cervical back: No rigidity or tenderness.  Neurological:     Mental Status: She is alert.  Psychiatric:        Mood and Affect: Mood normal.   Vitals:   08/06/21 1456  BP: 118/70  Pulse: 64  Temp: 97.6 F (36.4 C)  SpO2: 96%       Has not been using CPAP  Overnight oximetry did reveal significant desaturations on 2 L of oxygen was recommended  Assessment & Plan:  .  Nonrestorative sleep  .  Still with some daytime sleepiness  .  Multiple awakenings-has to use the bathroom  .  Nocturnal desaturations  .  Intolerance to CPAP  .  Hypoxemic respiratory failure with oxygen supplementation piped into CPAP in the past  .  Deconditioning  .  Continues to benefit from nebulization  Plan:  Encouraged to use oxygen supplementation at  night  Multiple awakenings related to using diuretics  Continue bronchodilators  I will see her back in 4 months

## 2021-08-06 NOTE — Patient Instructions (Signed)
Visit Information  Phone number for Pharmacist: [phone]   Goals Addressed             This Visit's Progress    Manage My Medicine       Timeframe:  Long-Range Goal Priority:  High Start Date:    08/06/21                         Expected End Date:     02/03/22                  Follow Up Date Dec 2023   - call for medicine refill 2 or 3 days before it runs out - call if I am sick and can't take my medicine - keep a list of all the medicines I take; vitamins and herbals too - use a pillbox to sort medicine  - organize all medicines into AM and PM time slots only - Move Myrbetriq to PM   Why is this important?   These steps will help you keep on track with your medicines.   Notes:         Care Plan : [place]  Updates made by Charlton Haws, RPH since 08/06/2021 12:00 AM     Problem: Hypertension, Heart Failure, Anxiety/Insomnia, Osteoarthritis, and Overactive Bladder   Priority: High     Long-Range Goal: disease management   Start Date: 08/06/2021  Expected End Date: 08/06/2022  This Visit's Progress: On track  Priority: High  Note:   Current Barriers:  Unable to independently monitor therapeutic efficacy Does not adhere to prescribed medication regimen  Pharmacist Clinical Goal(s):  Patient will achieve adherence to monitoring guidelines and medication adherence to achieve therapeutic efficacy adhere to prescribed medication regimen as evidenced by pill box through collaboration with PharmD and provider.   Interventions: 1:1 collaboration with [name], MD regarding development and update of comprehensive plan of care as evidenced by provider attestation and co-signature Inter-disciplinary care team collaboration (see longitudinal plan of care) Comprehensive medication review performed; medication list updated in electronic medical record  Heart Failure/Hypertension    Type: Diastolic  Last ejection fraction: 60-65%  (07/22/2019) BP goal: < 140/90  Patient checks BP at home daily Patient home BP readings are ranging: 105/81-169/72   Patient has failed these meds in past: amlodipine, enalapril/HCTZ, furosemide, losartan, chlorthalidone Patient is currently controlled on the following medications:  carvedilol 12.5 mg BID - LF 06/13/21 x 90 ds ([place]) hydralazine 50 mg BID spironolactone 12.5 mg (1/2 of 25 mg) daily - LF 02/05/21 x 30 ds torsemide 20 mg daily   We discussed: Pt reports she often takes carvedilol only once a day; she reports she does not take AM dose with other 8am meds but at 11am and PM dose sometime in the afternoon; discussed ways to simplify medication regimen so she can take meds just twice a day instead of various meds spread throughout midday   Plan: Advised to organize all medications in AM and PM pill box Refilled spironolactone  Overactive bladder    Patient has failed these meds in past: Vesicare Patient is currently controlled on the following medications:  Myrbetriq 50 mg daily   We discussed:  Pt reports compliance with Myrbetriq, she takes it at about 3pm every day; she reports urinary symptoms are most bothersome at night; she was also referred to urology earlier this year and they have called her to make an appt  Plan: Move Myrbetriq to bedtime   Anxiety/depression    Patient has failed these meds in past: gabapentin, duloxetine, hydroxyzine, alprazolam Patient is currently controlled on the following medications:  clonazepam 1 mg BID prn,  trazodone 150 mg - 1/2 tab HS   We discussed:  pt reports she could not tolerate 150 mg daily due to difficulty waking up in the morning she started cutting in half; she would prefer to go back to 100 mg dose  Plan: Change trazodone back to 100 mg daily   Chronic cough    Patient has failed these meds in past: benzonatate Patient is currently uncontrolled on the following medications:  Cetirizine 10 mg -  PRN Albuterol 0.083% nebulizer - BID   We discussed:  Pt reports 1 box of albuterol lasts her 12 days, she requests an rx for 2-3 boxes at a time;   Plan: Order albuterol #225 mL at a time (3 boxes)   Osteoarthritis  Patient is currently controlled on the following medications:  Hydrocodone-APAP 10-325 MG #90 Aspercreme spray  We discussed:  Pt reports OTC asperecreme is helpful; she uses cane to get around due to arthritis pain  Plan: Continue current medications   Patient Goals/Self-Care Activities Patient will:  - take medications as prescribed -focus on medication adherence by pill box - organize all medicines into AM and PM time slots only -check blood pressure daily, document, and provide at future appointments -Move Myrbetriq to PM      Patient verbalizes understanding of instructions provided today and agrees to view in [place].  Telephone follow up appointment with pharmacy team member scheduled for: 3 months  Charlene Brooke, PharmD, [place], CPP Clinical Pharmacist [facility] [phone]

## 2021-08-06 NOTE — Progress Notes (Signed)
Chronic Care Management Pharmacy Note  08/06/2021 Name:  [name] MRN:  [id number] DOB:  05-07-1933  Summary: -Home BP range 105/71-169/86 per patient; SBP 130s-140s most often -Pt does not always take 2nd dose of carvedilol, hydralazine due to awkward timing of meds in middle of day -Trazodone 150 mg was too high and pt wants to return to 100 mg dose -Pt requests refill for albuterol solution #3 boxes at a time -Pt requests refill for spironolactone  Recommendations/Changes made from today's visit: -Advised to organize all meds into AM and PM pill box so she only takes meds twice a day -Refilled trazodone 100 mg, spironolactone 25 mg, and albuterol solution #3 boxes   Subjective: [name] is an 87 y.o. year old female who is a primary patient of [name], MD.  The CCM team was consulted for assistance with disease management and care coordination needs.    Engaged with patient by telephone for follow up visit in response to provider referral for pharmacy case management and/or care coordination services.   Consent to Services:  The patient was given information about Chronic Care Management services, agreed to services, and gave verbal consent prior to initiation of services.  Please see initial visit note for detailed documentation.   Patient Care Team: [name], MD as PCP - General [name], MD as PCP - Cardiology (Cardiology) [name], MD as Consulting Physician (Cardiology) Florance, Tomasa Blase, RN as [place], [place], [facility] as Pharmacist (Pharmacist) Hortencia Pilar, MD as Consulting Physician (Ophthalmology)  Recent office visits: 05/08/21 Pricilla Holm, MD (PCP) - Acute diverticulitis - Amoxicillin- Pot Clavulanate 975-125 mg prescribed, Promethazine 12.5 mg prescribed, Soft foods and counseling on diet,  Try Pepcid OTC, Follow up as needed.  03/20/21 Dr Ronnald Ramp  OV: c/o heartburn/nausea. Rx'd ondansetron  Recent consult visits: 08/02/21 Dr Harrell Gave (cardiology): f/u CHF. No med changes  05/23/21 overnight oximetry showed low O2 sat 88%, ordered O2 2L for sleep.  05/14/21 Weston Brass MD, Pulmonology - Ordered overnight oximetry. Follow up 3 months.  Hospital visits: Medication Reconciliation was completed by comparing discharge summary, patient's EMR and Pharmacy list, and upon discussion with patient.   Admitted to the hospital on 04/29/21 due to Acute diverticulitis. Discharge date was 04/30/21. Discharged from [facility].   -treated with analgesics, IV fluids and IV abx.  Medications that remain the same after Hospital Discharge:??  -All other medications will remain the same.     Objective:  Lab Results  Component Value Date   CREATININE 0.69 04/29/2021   BUN 11 04/29/2021   GFR 65.88 03/20/2021   GFRNONAA >60 04/29/2021   GFRAA >60 08/13/2020   NA 140 04/29/2021   K 3.7 04/29/2021   CALCIUM 9.0 04/29/2021   CO2 28 04/29/2021   GLUCOSE 113 (H) 04/29/2021    Lab Results  Component Value Date/Time   HGBA1C 5.0 08/26/2018 01:16 AM   HGBA1C 5.3 10/27/2017 02:01 PM   GFR 65.88 03/20/2021 01:44 PM   GFR 84.90 04/10/2020 02:03 PM    Last diabetic Eye exam:  Lab Results  Component Value Date/Time   HMDIABEYEEXA No Retinopathy 12/11/2017 12:00 AM    Last diabetic Foot exam:  Lab Results  Component Value Date/Time   HMDIABFOOTEX normal 12/24/2012 12:00 AM     Lab Results  Component Value Date   CHOL 186 04/10/2020   HDL 57.90 04/10/2020   LDLCALC 106 (H) 04/10/2020  LDLDIRECT 121.0 06/12/2017   TRIG 110.0 04/10/2020   CHOLHDL 3 04/10/2020    Hepatic Function Latest Ref Rng & Units 04/29/2021 04/28/2021 08/11/2020  Total Protein 6.5 - 8.1 g/dL 6.2(L) 6.5 7.0  Albumin 3.5 - 5.0 g/dL 3.4(L) 3.6 3.7  AST 15 - 41 U/L 13(L) 30 21  ALT 0 - 44 U/L _0 Alk Phosphatase 38 - 126 U/L 51 52 69  Total Bilirubin 0.3 -  1.2 mg/dL 0.9 1.3(H) 1.0  Bilirubin, Direct 0.0 - 0.3 mg/dL - - -    Lab Results  Component Value Date/Time   TSH 3.85 04/10/2020 02:03 PM   TSH 5.85 (H) 07/13/2019 04:03 PM   FREET4 0.46 (L) 04/24/2016 08:14 AM   FREET4 0.48 (L) 04/15/2016 04:14 PM    CBC Latest Ref Rng & Units 04/29/2021 04/28/2021 03/20/2021  WBC 4.0 - 10.5 K/uL 8.2 7.2 10.7(H)  Hemoglobin 12.0 - 15.0 g/dL 12.5 10.5(L) 12.9  Hematocrit 36.0 - 46.0 % 42.0 34.6(L) 39.8  Platelets 150 - 400 K/uL 153 128(L) 154.0    Lab Results  Component Value Date/Time   VD25OH 21.10 (L) 03/30/2018 01:44 PM   VD25OH 28.56 05/25/2014 02:13 PM    Clinical ASCVD: No  The ASCVD Risk score (Arnett DK, et al., 2019) failed to calculate for the following reasons:   The 2019 ASCVD risk score is only valid for ages 64 to 23    Depression screen PHQ 2/9 05/09/2021 03/28/2021 03/20/2021  Decreased Interest 2 0 0  Down, Depressed, Hopeless 1 0 0  PHQ - 2 Score 3 0 0  Altered sleeping 0 - -  Tired, decreased energy 3 - -  Change in appetite 1 - -  Feeling bad or failure about yourself  1 - -  Trouble concentrating 3 - -  Moving slowly or fidgety/restless 0 - -  Suicidal thoughts 0 - -  PHQ-9 Score 11 - -  Difficult doing work/chores - - -  Some recent data might be hidden     Social History   Tobacco Use  Smoking Status Never  Smokeless Tobacco Never   BP Readings from Last 3 Encounters:  08/02/21 130/70  05/14/21 126/84  05/08/21 (!) 170/98   Pulse Readings from Last 3 Encounters:  08/02/21 (!) 57  05/14/21 76  05/08/21 66   Wt Readings from Last 3 Encounters:  08/02/21 198 lb (89.8 kg)  05/14/21 200 lb 3.2 oz (90.8 kg)  05/08/21 199 lb 12.8 oz (90.6 kg)   BMI Readings from Last 3 Encounters:  08/02/21 31.96 kg/m  05/14/21 32.31 kg/m  05/08/21 32.25 kg/m    Assessment/Interventions: Review of patient past medical history, allergies, medications, health status, including review of consultants reports, laboratory  and other test data, was performed as part of comprehensive evaluation and provision of chronic care management services.   SDOH:  (Social Determinants of Health) assessments and interventions performed: Yes  SDOH Screenings   Alcohol Screen: Low Risk    Last Alcohol Screening Score (AUDIT): 0  Depression (PHQ2-9): Medium Risk   PHQ-2 Score: 11  Financial Resource Strain: Low Risk    Difficulty of Paying Living Expenses: Not hard at all  Food Insecurity: No Food Insecurity   Worried About Charity fundraiser in the Last Year: Never true   Ran Out of Food in the Last Year: Never true  Housing: Low Risk    Last Housing Risk Score: 0  Physical Activity: Sufficiently Active   Days  of Exercise per Week: 5 days   Minutes of Exercise per Session: 30 min  Social Connections: Moderately Integrated   Frequency of Communication with Friends and Family: More than three times a week   Frequency of Social Gatherings with Friends and Family: More than three times a week   Attends Religious Services: More than 4 times per year   Active Member of Clubs or Organizations: No   Attends Music therapist: More than 4 times per year   Marital Status: Widowed  Stress: No Stress Concern Present   Feeling of Stress : Not at all  Tobacco Use: Low Risk    Smoking Tobacco Use: Never   Smokeless Tobacco Use: Never  Transportation Needs: No Transportation Needs   Lack of Transportation (Medical): No   Lack of Transportation (Non-Medical): No    CCM Care Plan  Allergies  Allergen Reactions   Amlodipine Other (See Comments)    Reaction:  Dizziness    Enalapril Cough   Losartan Potassium Other (See Comments)    Reaction:  GI pain    Morphine And Related Itching and Nausea And Vomiting   Metformin Diarrhea    Medications Reviewed Today     Reviewed by Charlton Haws, [facility] (Pharmacist) on 08/06/21 at 1141  Med List Status: <None>   Medication Order Taking? Sig Documenting Provider  Last Dose Status Informant  albuterol (PROVENTIL) (2.5 MG/3ML) 0.083% nebulizer solution [id number] Yes INHALE THE CONTENTS OF 1 VIAL VIA NEBULIZER TWICE DAILY AS NEEDED FOR WHEEZING OR SHORTNESS OF Cherly Beach, MD Taking Active   carvedilol (COREG) 12.5 MG tablet [id number] Yes Take 12.5 mg by mouth 2 (two) times daily with a meal. [name], MD Taking Active Self  Cholecalciferol 2000 units TABS [id number] Yes Take 2 tablets (4,000 Units total) by mouth daily. [name], MD Taking Active Self  clonazePAM Bobbye [place]) 1 MG tablet [id number] Yes Take 1 tablet (1 mg total) by mouth daily as needed for anxiety. Jennye Boroughs, MD Taking Active   hydrALAZINE (APRESOLINE) 50 MG tablet [id number] Yes Take 1 tablet (50 mg total) by mouth 2 (two) times daily. [name], MD Taking Active Self  HYDROcodone-acetaminophen [facility]) 10-325 MG tablet [id number] Yes Take 1 tablet by mouth every 6 (six) hours as needed. Binnie Rail, MD Taking Active   MYRBETRIQ 50 MG TB24 tablet [id number] Yes TAKE 1 TABLET(50 MG) BY MOUTH DAILY [name], MD Taking Active   omeprazole (PRILOSEC) 40 MG capsule [id number] Yes TAKE 1 CAPSULE(40 MG) BY MOUTH DAILY [name], MD Taking Active   promethazine (PHENERGAN) 12.5 MG tablet [id number] Yes TAKE 1 TABLET(12.5 MG) BY MOUTH EVERY 8 HOURS AS NEEDED FOR NAUSEA OR VOMITING [name], MD Taking Active   spironolactone (ALDACTONE) 25 MG tablet [id number] Yes Take 0.5 tablets (12.5 mg total) by mouth daily. Shawna Clamp, MD Taking Active Self  torsemide (DEMADEX) 20 MG tablet [id number] Yes TAKE 1 TABLET(20 MG) BY MOUTH DAILY [name], MD Taking Active   traZODone (DESYREL) 150 MG tablet [id number] Yes Take 0.5 tablets (75 mg total) by mouth at bedtime. Jennye Boroughs, MD Taking Active             Patient Active Problem List   Diagnosis Date Noted   Chronic pain 04/28/2021   Deficiency anemia 03/20/2021   Encounter for  palliative care involving management of pain 09/20/2020   Hypertensive urgency 08/11/2020   Excessive somnolence disorder 04/12/2020   Routine  general medical examination at a health care facility 04/10/2020   Thrombocytopenia ([place]) 04/10/2020   Anxiety 08/26/2018   Seizure ([place]) 08/25/2018   GERD (gastroesophageal reflux disease) 08/25/2018   Chronic diastolic CHF (congestive heart failure) ([place]) 08/25/2018   Major depressive disorder, recurrent episode with anxious distress ([place]) 02/04/2017   Dementia arising in the senium and presenium ([place]) 06/27/2016   Obesity, Class II, BMI 35-39.9, with comorbidity 05/15/2016   Insomnia w/ sleep apnea 10/16/2015   IBS (irritable bowel syndrome) 02/27/2015   Hashimoto's thyroiditis 05/05/2014   Adrenal incidentaloma ([place]) 11/04/2013   Benign paroxysmal positional vertigo 07/15/2013   Aortic stenosis, mild 07/14/2013   OAB (overactive bladder) 02/24/2013   GERD with stricture 12/24/2012   Sleep-related hypoventilation 07/30/2012   OSA (obstructive sleep apnea) 04/16/2012   Osteopenia 04/16/2012   Preventative health care 05/24/2011   Vitamin D deficiency 12/04/2010   Hyperlipidemia with target LDL less than 130 12/03/2010   GAD (generalized anxiety disorder) 12/03/2010   Essential hypertension 12/03/2010   Osteoarthritis 12/03/2010    Immunization History  Administered Date(s) Administered   Fluad Quad(high Dose 65+) 08/19/2019   Influenza Split 08/20/2012   Influenza, High Dose Seasonal PF 08/02/2015, 09/10/2016, 09/17/2017, 09/15/2018   Influenza,inj,Quad PF,6+ Mos 10/28/2013, 11/28/2014   Influenza-Unspecified 08/16/2020   PFIZER Comirnaty(Gray Top)Covid-19 Tri-Sucrose Vaccine 01/02/2021   PFIZER(Purple Top)SARS-COV-2 Vaccination 12/30/2019, 01/20/2020   Pneumococcal Conjugate-13 05/25/2014   Pneumococcal Polysaccharide-23 04/16/2012, 09/15/2018   Td 04/25/2010   Tdap 04/10/2020   Zoster, Live 11/28/2014    Conditions to be  addressed/monitored:  Hypertension, Heart Failure, Anxiety, Osteoarthritis, and Overactive Bladder  Care Plan : [place]  Updates made by Charlton Haws, [place] since 08/06/2021 12:00 AM     Problem: Hypertension, Heart Failure, Anxiety/Insomnia, Osteoarthritis, and Overactive Bladder   Priority: High     Long-Range Goal: disease management   Start Date: 08/06/2021  Expected End Date: 08/06/2022  This Visit's Progress: On track  Priority: High  Note:   Current Barriers:  Unable to independently monitor therapeutic efficacy Does not adhere to prescribed medication regimen  Pharmacist Clinical Goal(s):  Patient will achieve adherence to monitoring guidelines and medication adherence to achieve therapeutic efficacy adhere to prescribed medication regimen as evidenced by pill box through collaboration with PharmD and provider.   Interventions: 1:1 collaboration with [name], MD regarding development and update of comprehensive plan of care as evidenced by provider attestation and co-signature Inter-disciplinary care team collaboration (see longitudinal plan of care) Comprehensive medication review performed; medication list updated in electronic medical record  Heart Failure/Hypertension    Type: Diastolic  Last ejection fraction: 60-65% (07/22/2019) BP goal: < 140/90  Patient checks BP at home daily Patient home BP readings are ranging: 105/81-169/72   Patient has failed these meds in past: amlodipine, enalapril/HCTZ, furosemide, losartan, chlorthalidone Patient is currently controlled on the following medications:  carvedilol 12.5 mg BID - LF 06/13/21 x 90 ds ([place]) hydralazine 50 mg BID spironolactone 12.5 mg (1/2 of 25 mg) daily - LF 02/05/21 x 30 ds torsemide 20 mg daily   We discussed: Pt reports she often takes carvedilol only once a day; she reports she does not take AM dose with other 8am meds but at 11am and PM dose sometime in the  afternoon; discussed ways to simplify medication regimen so she can take meds just twice a day instead of various meds spread throughout midday   Plan: Advised to organize all medications in AM and PM  pill box Refilled spironolactone  Overactive bladder    Patient has failed these meds in past: Vesicare Patient is currently controlled on the following medications:  Myrbetriq 50 mg daily   We discussed:  Pt reports compliance with Myrbetriq, she takes it at about 3pm every day; she reports urinary symptoms are most bothersome at night; she was also referred to urology earlier this year and they have called her to make an appt   Plan: Move Myrbetriq to bedtime   Anxiety/depression    Patient has failed these meds in past: gabapentin, duloxetine, hydroxyzine, alprazolam Patient is currently controlled on the following medications:  clonazepam 1 mg BID prn,  trazodone 150 mg - 1/2 tab HS   We discussed:  pt reports she could not tolerate 150 mg daily due to difficulty waking up in the morning she started cutting in half; she would prefer to go back to 100 mg dose  Plan: Change trazodone back to 100 mg daily   Chronic cough    Patient has failed these meds in past: benzonatate Patient is currently uncontrolled on the following medications:  Cetirizine 10 mg - PRN Albuterol 0.083% nebulizer - BID   We discussed:  Pt reports 1 box of albuterol lasts her 12 days, she requests an rx for 2-3 boxes at a time;   Plan: Order albuterol #225 mL at a time (3 boxes)   Osteoarthritis  Patient is currently controlled on the following medications:  Hydrocodone-APAP 10-325 MG #90 Aspercreme spray  We discussed:  Pt reports OTC asperecreme is helpful; she uses cane to get around due to arthritis pain  Plan: Continue current medications   Patient Goals/Self-Care Activities Patient will:  - take medications as prescribed -focus on medication adherence by pill box - organize all medicines  into AM and PM time slots only -check blood pressure daily, document, and provide at future appointments -Move Myrbetriq to PM      Medication Assistance: None required.  Patient affirms current coverage meets needs.  Compliance/Adherence/Medication fill history: Care Gaps: Vaccines - Shingrix, covid booster, influenza  Star-Rating Drugs: none  Patient's preferred pharmacy is:  [facility] [place], [address] AT [address] [place] [address] Phone: [phone] Fax: [phone]  [facility] - [place], [address] [place] [address] Phone: [phone] Fax: [phone]  Uses pill box? Yes Pt endorses 90% compliance  We discussed: Current pharmacy is preferred with insurance plan and patient is satisfied with pharmacy services Patient decided to: Continue current medication management strategy  Care Plan and Follow Up Patient Decision:  Patient agrees to Care Plan and Follow-up.  Plan: Telephone follow up appointment with care management team member scheduled for:  3 months  Charlene Brooke, PharmD, [place], CPP Clinical Pharmacist [facility] [phone]

## 2021-08-06 NOTE — Patient Instructions (Signed)
Continue nebulization treatments  Try and make sure you keep your oxygen on at night  Call with significant concerns  I will see you back in about 4 months

## 2021-08-07 ENCOUNTER — Other Ambulatory Visit: Payer: Self-pay | Admitting: Internal Medicine

## 2021-08-07 DIAGNOSIS — I10 Essential (primary) hypertension: Secondary | ICD-10-CM

## 2021-08-07 DIAGNOSIS — I5032 Chronic diastolic (congestive) heart failure: Secondary | ICD-10-CM

## 2021-08-08 ENCOUNTER — Other Ambulatory Visit: Payer: Self-pay

## 2021-08-08 NOTE — Patient Outreach (Signed)
[facility]) Care Management  08/08/2021  [name] 02-28-1933 [id number]   Telephone Assessment   Successful outreach call placed to patient. She denies any acute issues or concerns at present. She reports she has been doing fairly well. She is currently in the kitchen baking cake for her neighbor. She remains fairly independent with ADLs/IADLs. She lives alone but has family nearby to assist as needed. No recent falls. Patient voices she continues to use walker to help her get around. She denies any RN CM needs or concerns at this time.   Medications Reviewed Today     Reviewed by Hayden Pedro, RN (Registered Nurse) on 08/08/21 at [place] List Status: <None>   Medication Order Taking? Sig Documenting Provider Last Dose Status Informant  albuterol (PROVENTIL) (2.5 MG/3ML) 0.083% nebulizer solution [id number] No INHALE THE CONTENTS OF 1 VIAL VIA NEBULIZER TWICE DAILY AS NEEDED FOR WHEEZING OR SHORTNESS OF Cherly Beach, MD Taking Active   carvedilol (COREG) 12.5 MG tablet [id number] No Take 12.5 mg by mouth 2 (two) times daily with a meal. [name], MD Taking Active Self  Cholecalciferol 2000 units TABS [id number] No Take 2 tablets (4,000 Units total) by mouth daily. [name], MD Taking Active Self  clonazePAM Bobbye [place]) 1 MG tablet [id number] No Take 1 tablet (1 mg total) by mouth daily as needed for anxiety. Jennye Boroughs, MD Taking Active   hydrALAZINE (APRESOLINE) 50 MG tablet [id number] No Take 1 tablet (50 mg total) by mouth 2 (two) times daily. [name], MD Taking Active Self  HYDROcodone-acetaminophen [facility]) 10-325 MG tablet [id number] No Take 1 tablet by mouth every 6 (six) hours as needed. Binnie Rail, MD Taking Active   MYRBETRIQ 50 MG TB24 tablet [id number] No TAKE 1 TABLET(50 MG) BY MOUTH DAILY [name], MD Taking Active   omeprazole (PRILOSEC) 40 MG capsule [id number] No TAKE 1 CAPSULE(40 MG) BY  MOUTH DAILY [name], MD Taking Active   promethazine (PHENERGAN) 12.5 MG tablet [id number] No TAKE 1 TABLET(12.5 MG) BY MOUTH EVERY 8 HOURS AS NEEDED FOR NAUSEA OR VOMITING [name], MD Taking Active   spironolactone (ALDACTONE) 25 MG tablet [id number] No Take 0.5 tablets (12.5 mg total) by mouth daily. [name], MD Taking Active   torsemide (DEMADEX) 20 MG tablet [id number]  TAKE 1 TABLET BY MOUTH EVERY DAY [name], MD  Active   traZODone (DESYREL) 100 MG tablet [id number] No Take 1 tablet (100 mg total) by mouth at bedtime. [name], MD Taking Active   trolamine salicylate (ASPERCREME) 10 % cream [id number] No Apply 1 application topically as needed for muscle pain. [provider] Taking Active                Goals Addressed               This Visit's Progress     [facility] )Effective Breathing Pattern During Sleep (pt-stated)        Timeframe:  Long-Range Goal Priority:  High Start Date:  08/08/21                           Expected End Date: Jan 2023   Barriers: Health Behaviors Knowledge                 -pt will use oxygen QHS to adie in sleep -pt will adhere to MD follow up appt  scheduled -pt will alert MD of changes in condition -pt will adhere to med regimen           Evidence-based guidance:  Use a validated screening tool to identify risk for obstructive sleep apnea (OSA).  Encourage a nonsupine sleep position, such as side-lying, head of bed elevated, multiple pillows, to prevent airway collapse.  Encourage the use of sleep hygiene techniques.  Anticipate a referral for sleep study (polysomnography).  If OSA is identified, prepare patient for treatment options, such as nighttime oxygen therapy and CPAP (continuous positive airway pressure) or BiPAP (bilevel positive airway pressure).   Notes:   08/08/21-Patient reports she was unable tot tolerate CPAP that was ordered for her. MD changed her to just 2L oxygen at night and  she has been doing that. She feels like she is resting better. She still has some nocturia related to meds and CCM pharmacist made recommendations to pt on when to take med and how to manage sxs.       ([facility])Track and Manage Fluids and Swelling (pt-stated)        Timeframe:  Long-Range Goal Priority:  High Start Date: 09/04/20                       Expected End Date:  Nov 2022  Follow Up Date Nov 2022  Barriers: Health Behaviors Knowledge    - call office if I gain more than 2 pounds in one day or 5 pounds in one week - keep legs up while sitting - use salt in moderation - watch for swelling in feet, ankles and legs every day - weigh myself daily    Why is this important?   It is important to check your weight daily and watch how much salt and liquids you have.  It will help you to manage your heart failure.    Notes:  12/27/20-Wgt stable. Patient has lost about 9 lbs. She reports adhering to diuretic regimen. She denies an edema or SOB during this call.  03/28/21-Patient reviewed wgt log with RN CM. She denies any SOB. She has followed up with cardiologist recently.   05/29/21-Patient weighing in the home but not consistently every day. Denies any edema at present. Occasional SOB reported-saw lung MD recently and had testing done.   08/08/21-Patient reported wgt at MD office this week was 198 lbs. Denies any edema. She continues to be adherent to diuretic regimen and fluid restrictions.       (THN)Track and Manage My Blood Pressure (pt-stated)        Timeframe:  Long-Range Goal Priority:  High Start Date:  09/04/20                           Expected End Date:  Nov 2022                    Follow Up Date Nov2022  Barriers: Health Behaviors Knowledge   - check blood pressure daily - write blood pressure results in a log or diary  -take BP log to all MD appts -call MD for any abnornal BP readings.   Why is this important?   You won't feel high blood pressure, but it can still  hurt your blood vessels.  High blood pressure can cause heart or kidney problems. It can also cause a stroke.  Making lifestyle changes like losing a little weight or eating less salt will help.  Checking your blood pressure at home and at different times of the day can help to control blood pressure.  If the doctor prescribes medicine remember to take it the way the doctor ordered.  Call the office if you cannot afford the medicine or if there are questions about it.     Notes: 12/26/20-RN CM did not get to address this call-patient had to end call early  03/28/21-Patient checking BP daily and reviewed Bp log with RN CM during call. BP remains fairly stable. She is adhering to med regimen. She is not at goal BP per MD recommendations.   05/29/21-Did not get to address with pt-pt requested brief call.   08/08/21-Patient had to end call early-did not assess.          Plan: RN CM discussed with patient next outreach within the month of Nov. Patient agrees to care plan and follow up. Patient gave verbal consent and in agreement with RN CM follow up and timeframe. Patient aware that they may contact RN CM sooner for any issues or concerns. RN CM reviewed goals and plan of care with patient. RN CM will send quarterly update to PCP.  [name], RN,BSN,CCM [place] Management Telephonic Care Management Coordinator Direct Phone: [phone] Toll Free: [phone] Fax: [phone]

## 2021-08-19 ENCOUNTER — Other Ambulatory Visit: Payer: Self-pay | Admitting: Internal Medicine

## 2021-08-21 DIAGNOSIS — G4733 Obstructive sleep apnea (adult) (pediatric): Secondary | ICD-10-CM | POA: Diagnosis not present

## 2021-08-24 DIAGNOSIS — J4520 Mild intermittent asthma, uncomplicated: Secondary | ICD-10-CM

## 2021-08-24 DIAGNOSIS — I10 Essential (primary) hypertension: Secondary | ICD-10-CM | POA: Diagnosis not present

## 2021-08-24 DIAGNOSIS — I5032 Chronic diastolic (congestive) heart failure: Secondary | ICD-10-CM | POA: Diagnosis not present

## 2021-08-24 DIAGNOSIS — M8949 Other hypertrophic osteoarthropathy, multiple sites: Secondary | ICD-10-CM

## 2021-08-30 ENCOUNTER — Telehealth: Payer: Self-pay | Admitting: Internal Medicine

## 2021-08-30 DIAGNOSIS — R351 Nocturia: Secondary | ICD-10-CM | POA: Diagnosis not present

## 2021-08-30 DIAGNOSIS — N3946 Mixed incontinence: Secondary | ICD-10-CM | POA: Diagnosis not present

## 2021-08-30 DIAGNOSIS — R350 Frequency of micturition: Secondary | ICD-10-CM | POA: Diagnosis not present

## 2021-08-30 NOTE — Telephone Encounter (Signed)
   Patient requesting refill for HYDROcodone-acetaminophen [facility]) 10-325 MG tablet  Pharmacy  [facility], [address] AT [address]

## 2021-08-31 ENCOUNTER — Other Ambulatory Visit: Payer: Self-pay | Admitting: Internal Medicine

## 2021-08-31 ENCOUNTER — Other Ambulatory Visit: Payer: Self-pay | Admitting: Cardiology

## 2021-08-31 DIAGNOSIS — M159 Polyosteoarthritis, unspecified: Secondary | ICD-10-CM

## 2021-08-31 DIAGNOSIS — Z515 Encounter for palliative care: Secondary | ICD-10-CM

## 2021-08-31 DIAGNOSIS — G894 Chronic pain syndrome: Secondary | ICD-10-CM

## 2021-08-31 MED ORDER — HYDROCODONE-ACETAMINOPHEN 10-325 MG PO TABS: 1.0000 | ORAL_TABLET | Freq: Four times a day (QID) | ORAL | 0 refills | Status: DC | PRN

## 2021-08-31 NOTE — Telephone Encounter (Signed)
Patient calling to check status of refill request.

## 2021-09-19 ENCOUNTER — Other Ambulatory Visit: Payer: Self-pay

## 2021-09-19 ENCOUNTER — Ambulatory Visit (INDEPENDENT_AMBULATORY_CARE_PROVIDER_SITE_OTHER): Payer: Medicare HMO | Admitting: Internal Medicine

## 2021-09-19 ENCOUNTER — Encounter: Payer: Self-pay | Admitting: Internal Medicine

## 2021-09-19 VITALS — BP 128/82 | HR 74 | Temp 98.6°F | Ht 62.0 in | Wt 198.0 lb

## 2021-09-19 DIAGNOSIS — Z0000 Encounter for general adult medical examination without abnormal findings: Secondary | ICD-10-CM | POA: Diagnosis not present

## 2021-09-19 DIAGNOSIS — I10 Essential (primary) hypertension: Secondary | ICD-10-CM

## 2021-09-19 DIAGNOSIS — E785 Hyperlipidemia, unspecified: Secondary | ICD-10-CM | POA: Diagnosis not present

## 2021-09-19 DIAGNOSIS — R739 Hyperglycemia, unspecified: Secondary | ICD-10-CM | POA: Insufficient documentation

## 2021-09-19 LAB — BASIC METABOLIC PANEL
BUN: 14 mg/dL (ref 6–23)
CO2: 31 mEq/L (ref 19–32)
Calcium: 9.6 mg/dL (ref 8.4–10.5)
Chloride: 101 mEq/L (ref 96–112)
Creatinine, Ser: 0.7 mg/dL (ref 0.40–1.20)
GFR: 78.22 mL/min (ref >60.00)
Glucose, Bld: 90 mg/dL (ref 70–99)
Potassium: 4 mEq/L (ref 3.5–5.1)
Sodium: 139 mEq/L (ref 135–145)

## 2021-09-19 LAB — LIPID PANEL
Cholesterol: 183 mg/dL (ref 0–200)
HDL: 60.2 mg/dL (ref >39.00)
LDL Cholesterol: 98 mg/dL (ref 0–99)
NonHDL: 123.03
Total CHOL/HDL Ratio: 3
Triglycerides: 123 mg/dL (ref 0.0–149.0)
VLDL: 24.6 mg/dL (ref 0.0–40.0)

## 2021-09-19 LAB — HEMOGLOBIN A1C: Hgb A1c MFr Bld: 4.8 % (ref 4.6–6.5)

## 2021-09-19 LAB — TSH: TSH: 4.39 u[IU]/mL (ref 0.35–5.50)

## 2021-09-19 NOTE — Patient Instructions (Signed)
Health Maintenance, Female Adopting a healthy lifestyle and getting preventive care are important in promoting health and wellness. Ask your health care provider about: The right schedule for you to have regular tests and exams. Things you can do on your own to prevent diseases and keep yourself healthy. What should I know about diet, weight, and exercise? Eat a healthy diet  Eat a diet that includes plenty of vegetables, fruits, low-fat dairy products, and lean protein. Do not eat a lot of foods that are high in solid fats, added sugars, or sodium. Maintain a healthy weight Body mass index (BMI) is used to identify weight problems. It estimates body fat based on height and weight. Your health care provider can help determine your BMI and help you achieve or maintain a healthy weight. Get regular exercise Get regular exercise. This is one of the most important things you can do for your health. Most adults should: Exercise for at least 150 minutes each week. The exercise should increase your heart rate and make you sweat (moderate-intensity exercise). Do strengthening exercises at least twice a week. This is in addition to the moderate-intensity exercise. Spend less time sitting. Even light physical activity can be beneficial. Watch cholesterol and blood lipids Have your blood tested for lipids and cholesterol at 85 years of age, then have this test every 5 years. Have your cholesterol levels checked more often if: Your lipid or cholesterol levels are high. You are older than 85 years of age. You are at high risk for heart disease. What should I know about cancer screening? Depending on your health history and family history, you may need to have cancer screening at various ages. This may include screening for: Breast cancer. Cervical cancer. Colorectal cancer. Skin cancer. Lung cancer. What should I know about heart disease, diabetes, and high blood pressure? Blood pressure and heart  disease High blood pressure causes heart disease and increases the risk of stroke. This is more likely to develop in people who have high blood pressure readings, are of African descent, or are overweight. Have your blood pressure checked: Every 3-5 years if you are 18-39 years of age. Every year if you are 40 years old or older. Diabetes Have regular diabetes screenings. This checks your fasting blood sugar level. Have the screening done: Once every three years after age 40 if you are at a normal weight and have a low risk for diabetes. More often and at a younger age if you are overweight or have a high risk for diabetes. What should I know about preventing infection? Hepatitis B If you have a higher risk for hepatitis B, you should be screened for this virus. Talk with your health care provider to find out if you are at risk for hepatitis B infection. Hepatitis C Testing is recommended for: Everyone born from 1945 through 1965. Anyone with known risk factors for hepatitis C. Sexually transmitted infections (STIs) Get screened for STIs, including gonorrhea and chlamydia, if: You are sexually active and are younger than 85 years of age. You are older than 85 years of age and your health care provider tells you that you are at risk for this type of infection. Your sexual activity has changed since you were last screened, and you are at increased risk for chlamydia or gonorrhea. Ask your health care provider if you are at risk. Ask your health care provider about whether you are at high risk for HIV. Your health care provider may recommend a prescription medicine   to help prevent HIV infection. If you choose to take medicine to prevent HIV, you should first get tested for HIV. You should then be tested every 3 months for as long as you are taking the medicine. Pregnancy If you are about to stop having your period (premenopausal) and you may become pregnant, seek counseling before you get  pregnant. Take 400 to 800 micrograms (mcg) of folic acid every day if you become pregnant. Ask for birth control (contraception) if you want to prevent pregnancy. Osteoporosis and menopause Osteoporosis is a disease in which the bones lose minerals and strength with aging. This can result in bone fractures. If you are 65 years old or older, or if you are at risk for osteoporosis and fractures, ask your health care provider if you should: Be screened for bone loss. Take a calcium or vitamin D supplement to lower your risk of fractures. Be given hormone replacement therapy (HRT) to treat symptoms of menopause. Follow these instructions at home: Lifestyle Do not use any products that contain nicotine or tobacco, such as cigarettes, e-cigarettes, and chewing tobacco. If you need help quitting, ask your health care provider. Do not use street drugs. Do not share needles. Ask your health care provider for help if you need support or information about quitting drugs. Alcohol use Do not drink alcohol if: Your health care provider tells you not to drink. You are pregnant, may be pregnant, or are planning to become pregnant. If you drink alcohol: Limit how much you use to 0-1 drink a day. Limit intake if you are breastfeeding. Be aware of how much alcohol is in your drink. In the [place] one drink equals one 12 oz bottle of beer (355 mL), one 5 oz glass of wine (148 mL), or one 1 oz glass of hard liquor (44 mL). General instructions Schedule regular health, dental, and eye exams. Stay current with your vaccines. Tell your health care provider if: You often feel depressed. You have ever been abused or do not feel safe at home. Summary Adopting a healthy lifestyle and getting preventive care are important in promoting health and wellness. Follow your health care provider's instructions about healthy diet, exercising, and getting tested or screened for diseases. Follow your health care provider's  instructions on monitoring your cholesterol and blood pressure. This information is not intended to replace advice given to you by your health care provider. Make sure you discuss any questions you have with your health care provider. Document Revised: 01/19/2021 Document Reviewed: 11/04/2018 Elsevier Patient Education  2022 Elsevier Inc.  

## 2021-09-19 NOTE — Progress Notes (Signed)
Subjective:  Patient ID: [name], female    DOB: 01-21-1933  Age: 87 y.o. MRN: [id number]  CC: Annual Exam, Hypertension, and Gastroesophageal Reflux  This visit occurred during the SARS-CoV-2 public health emergency.  Safety protocols were in place, including screening questions prior to the visit, additional usage of staff PPE, and extensive cleaning of exam room while observing appropriate contact time as indicated for disinfecting solutions.    HPI [name] presents for a CPX and f/up -   She has her baseline level of shortness of breath and wears oxygen at night.  She continues to complain of arthralgias in her large joints.  She denies chest pain, edema, palpitations, or dyspnea on exertion.  Outpatient Medications Prior to Visit  Medication Sig Dispense Refill   albuterol (PROVENTIL) (2.5 MG/3ML) 0.083% nebulizer solution INHALE THE CONTENTS OF 1 VIAL VIA NEBULIZER TWICE DAILY AS NEEDED FOR WHEEZING OR SHORTNESS OF BREATH 225 mL 5   carvedilol (COREG) 12.5 MG tablet Take 12.5 mg by mouth 2 (two) times daily with a meal.     Cholecalciferol 2000 units TABS Take 2 tablets (4,000 Units total) by mouth daily. 180 tablet 1   clonazePAM (KLONOPIN) 1 MG tablet Take 1 tablet (1 mg total) by mouth daily as needed for anxiety.     hydrALAZINE (APRESOLINE) 50 MG tablet TAKE 1 TABLET(50 MG) BY MOUTH TWICE DAILY 180 tablet 1   HYDROcodone-acetaminophen (NORCO) 10-325 MG tablet Take 1 tablet by mouth every 6 (six) hours as needed. 90 tablet 0   MYRBETRIQ 50 MG TB24 tablet TAKE 1 TABLET(50 MG) BY MOUTH DAILY 90 tablet 1   omeprazole (PRILOSEC) 40 MG capsule TAKE 1 CAPSULE(40 MG) BY MOUTH DAILY 90 capsule 1   promethazine (PHENERGAN) 12.5 MG tablet TAKE 1 TABLET(12.5 MG) BY MOUTH EVERY 8 HOURS AS NEEDED FOR NAUSEA OR VOMITING 20 tablet 2   spironolactone (ALDACTONE) 25 MG tablet Take 0.5 tablets (12.5 mg total) by mouth daily. 45 tablet 0   torsemide (DEMADEX) 20 MG tablet TAKE 1 TABLET BY  MOUTH EVERY DAY 90 tablet 0   traZODone (DESYREL) 100 MG tablet Take 1 tablet (100 mg total) by mouth at bedtime. 90 tablet 0   trolamine salicylate (ASPERCREME) 10 % cream Apply 1 application topically as needed for muscle pain.     No facility-administered medications prior to visit.    ROS Review of Systems  Constitutional:  Negative for chills, diaphoresis, fatigue and fever.  HENT: Negative.    Respiratory:  Positive for shortness of breath. Negative for cough, chest tightness and wheezing.   Cardiovascular:  Negative for chest pain, palpitations and leg swelling.  Gastrointestinal:  Negative for abdominal pain, diarrhea, nausea and vomiting.  Genitourinary: Negative.  Negative for difficulty urinating.  Musculoskeletal:  Positive for arthralgias. Negative for myalgias.  Skin: Negative.  Negative for color change.  Neurological: Negative.  Negative for dizziness, weakness and light-headedness.  Hematological:  Negative for adenopathy. Does not bruise/bleed easily.  Psychiatric/Behavioral:  Positive for confusion and decreased concentration. Negative for dysphoric mood, sleep disturbance and suicidal ideas. The patient is nervous/anxious.    Objective:  BP 128/82 (BP Location: Left Arm, Patient Position: Sitting, Cuff Size: Large)   Pulse 74   Temp 98.6 F (37 C) (Oral)   Ht 5\' 2"  (1.575 m)   Wt 198 lb (89.8 kg)   SpO2 95%   BMI 36.21 kg/m   BP Readings from Last 3 Encounters:  09/19/21 128/82  08/06/21  118/70  08/02/21 130/70    Wt Readings from Last 3 Encounters:  09/19/21 198 lb (89.8 kg)  08/06/21 195 lb 12.8 oz (88.8 kg)  08/02/21 198 lb (89.8 kg)    Physical Exam Vitals reviewed.  HENT:     Nose: Nose normal.     Mouth/Throat:     Mouth: Mucous membranes are moist.  Eyes:     Conjunctiva/sclera: Conjunctivae normal.  Cardiovascular:     Rate and Rhythm: Normal rate and regular rhythm.     Heart sounds: No murmur heard. Pulmonary:     Effort: Pulmonary  effort is normal.     Breath sounds: No stridor. No wheezing, rhonchi or rales.  Abdominal:     General: Abdomen is protuberant. Bowel sounds are normal. There is no distension.     Palpations: Abdomen is soft. There is no hepatomegaly, splenomegaly or mass.     Tenderness: There is no abdominal tenderness. There is no guarding.  Musculoskeletal:        General: Normal range of motion.     Cervical back: Neck supple.     Right lower leg: No edema.     Left lower leg: No edema.  Lymphadenopathy:     Cervical: No cervical adenopathy.  Skin:    General: Skin is warm and dry.  Neurological:     General: No focal deficit present.     Mental Status: She is alert.  Psychiatric:        Mood and Affect: Mood normal.        Behavior: Behavior normal.    Lab Results  Component Value Date   WBC 8.2 04/29/2021   HGB 12.5 04/29/2021   HCT 42.0 04/29/2021   PLT 153 04/29/2021   GLUCOSE 90 09/19/2021   CHOL 183 09/19/2021   TRIG 123.0 09/19/2021   HDL 60.20 09/19/2021   LDLDIRECT 121.0 06/12/2017   LDLCALC 98 09/19/2021   ALT 10 04/29/2021   AST 13 (L) 04/29/2021   NA 139 09/19/2021   K 4.0 09/19/2021   CL 101 09/19/2021   CREATININE 0.70 09/19/2021   BUN 14 09/19/2021   CO2 31 09/19/2021   TSH 4.39 09/19/2021   INR 1.0 08/11/2020   HGBA1C 4.8 09/19/2021    CT ABDOMEN PELVIS WO CONTRAST  Result Date: 04/28/2021 CLINICAL DATA:  Weakness with nausea and left-sided abdominal pain for 2 days. EXAM: CT ABDOMEN AND PELVIS WITHOUT CONTRAST TECHNIQUE: Multidetector CT imaging of the abdomen and pelvis was performed following the standard protocol without IV contrast. COMPARISON:  08/25/2018 FINDINGS: Lower chest: Bandlike density in the right lower lobe increased from 08/25/2018, but probably from atelectasis or scarring given the configuration. Mild cardiomegaly. Descending thoracic aortic atherosclerotic calcification. Hepatobiliary: Cholecystectomy.  Otherwise unremarkable. Pancreas:  Unremarkable Spleen: Unremarkable Adrenals/Urinary Tract: 3.0 by 1.8 cm right adrenal mass, internal density 39 Hounsfield units. This lesion is unchanged from 08/25/2018. Exophytic 8 mm left kidney upper pole hypodense lesion on image 17 series 2 likely a cyst and not significantly changed from prior. Stomach/Bowel: There is some scattered diverticula of the distal ileum. Descending and sigmoid colon diverticulosis noted with some faint stranding along the sigmoid colon suggesting mild acute diverticulitis. No extraluminal gas or abscess. Vascular/Lymphatic: Aortoiliac atherosclerotic vascular disease. Reproductive: Uterus absent.  Adnexa unremarkable. Other: No supplemental non-categorized findings. Musculoskeletal: Mild grade 1 degenerative anterolisthesis at L4-5 and L5-S1. Fused facet joints at L5-S1 bilaterally. Chondrocalcinosis with degenerative hip arthropathy bilaterally, left greater than right. Small bilateral groin hernias  containing adipose tissue. IMPRESSION: 1. Descending and sigmoid colon diverticulosis with mild sigmoid colon diverticulitis. 2. Scattered diverticula of the distal ileum, without inflammatory findings along the distal ileum. 3. Other imaging findings of potential clinical significance: Aortic Atherosclerosis (ICD10-I70.0). Mild cardiomegaly. Chronically stable nonspecific right adrenal mass. Bilateral chronic hip arthropathy with chondrocalcinosis. Small bilateral groin hernias contain adipose tissue. Electronically Signed   By: Van Clines M.D.   On: 04/28/2021 14:57    Assessment & Plan:   Ashlee was seen today for annual exam, hypertension and gastroesophageal reflux.  Diagnoses and all orders for this visit:  Essential hypertension- Her blood pressure is adequately well controlled. -     TSH; Future -     Basic metabolic panel; Future -     Basic metabolic panel -     TSH  Hyperlipidemia with target LDL less than 130- LDL goal achieved. Doing well on the statin   -     Lipid panel; Future -     TSH; Future -     TSH -     Lipid panel  Routine general medical examination at a health care facility- Exam completed, labs reviewed, vaccines reviewed - She does not want a shingles vaccine, no cancer screenings indicated, patient education was given.  Chronic hyperglycemia- Her A1C is normal. -     Basic metabolic panel; Future -     Hemoglobin A1c; Future -     Hemoglobin A1c -     Basic metabolic panel  I am having Jannatul H. Mcglinchey maintain her Cholecalciferol, omeprazole, carvedilol, clonazePAM, promethazine, trolamine salicylate, traZODone, albuterol, spironolactone, torsemide, Myrbetriq, HYDROcodone-acetaminophen, and hydrALAZINE.  No orders of the defined types were placed in this encounter.    Follow-up: Return in about 6 months (around 03/20/2022).  Scarlette Calico, MD

## 2021-09-20 DIAGNOSIS — G4733 Obstructive sleep apnea (adult) (pediatric): Secondary | ICD-10-CM | POA: Diagnosis not present

## 2021-09-24 DIAGNOSIS — G4733 Obstructive sleep apnea (adult) (pediatric): Secondary | ICD-10-CM | POA: Diagnosis not present

## 2021-09-26 ENCOUNTER — Telehealth: Payer: Self-pay | Admitting: Internal Medicine

## 2021-09-26 ENCOUNTER — Other Ambulatory Visit: Payer: Self-pay | Admitting: Internal Medicine

## 2021-09-26 NOTE — Telephone Encounter (Signed)
1.Medication Requested: HYDROcodone-acetaminophen (NORCO) 10-325 MG tablet  2. Pharmacy (Name, Street, [place]): [facility] [phone] - [place], [place] - [address] AT [address]  3. On Med List: yes   4. Last Visit with PCP: 09-19-2021  5. Next visit date with PCP: 03-20-2021   Agent: Please be advised that RX refills may take up to 3 business days. We ask that you follow-up with your pharmacy.

## 2021-09-28 ENCOUNTER — Other Ambulatory Visit: Payer: Self-pay | Admitting: Internal Medicine

## 2021-09-28 DIAGNOSIS — G894 Chronic pain syndrome: Secondary | ICD-10-CM

## 2021-09-28 DIAGNOSIS — Z515 Encounter for palliative care: Secondary | ICD-10-CM

## 2021-09-28 DIAGNOSIS — M159 Polyosteoarthritis, unspecified: Secondary | ICD-10-CM

## 2021-09-28 DIAGNOSIS — M150 Primary generalized (osteo)arthritis: Secondary | ICD-10-CM

## 2021-09-28 MED ORDER — HYDROCODONE-ACETAMINOPHEN 10-325 MG PO TABS: 1.0000 | ORAL_TABLET | Freq: Four times a day (QID) | ORAL | 0 refills | Status: DC | PRN

## 2021-09-28 NOTE — Telephone Encounter (Signed)
Patient calling to check status of refill.  °

## 2021-09-28 NOTE — Telephone Encounter (Signed)
Patient calling in to check status of refill request

## 2021-09-30 ENCOUNTER — Other Ambulatory Visit: Payer: Self-pay | Admitting: Internal Medicine

## 2021-09-30 DIAGNOSIS — J4520 Mild intermittent asthma, uncomplicated: Secondary | ICD-10-CM

## 2021-10-12 DIAGNOSIS — N3946 Mixed incontinence: Secondary | ICD-10-CM | POA: Diagnosis not present

## 2021-10-15 ENCOUNTER — Other Ambulatory Visit: Payer: Self-pay

## 2021-10-15 NOTE — Patient Outreach (Signed)
[facility]) Care Management  10/15/2021  [name] 18-Aug-1933 [id number]   Telephone Assessment  Successful outreach call placed to patient. She is pleased to report that she is resting and feeling better overall since MD changed her meds. She denies any falls. She is using DME. Appetite remains WNL. She denies any RN CM needs or concerns at this time.   Medications Reviewed Today     Reviewed by Hayden Pedro, RN (Registered Nurse) on 10/15/21 at 53  Med List Status: <None>   Medication Order Taking? Sig Documenting Provider Last Dose Status Informant  albuterol (PROVENTIL) (2.5 MG/3ML) 0.083% nebulizer solution [id number]  INHALE THE CONTENTS OF 1 VIAL VIA NEBULIZER TWICE DAILY AS NEEDED FOR WHEEZING OR SHORTNESS OF [place]ly [place], MD  Active   carvedilol (COREG) 12.5 MG tablet [id number]  Take 12.5 mg by mouth 2 (two) times daily with a meal. [name], MD  Active Self  Cholecalciferol 2000 units TABS [id number]  Take 2 tablets (4,000 Units total) by mouth daily. [name], MD  Active Self  clonazePAM Bobbye [place]) 1 MG tablet [id number]  Take 1 tablet (1 mg total) by mouth daily as needed for anxiety. Jennye Boroughs, MD  Active   hydrALAZINE (APRESOLINE) 50 MG tablet [id number]  TAKE 1 TABLET(50 MG) BY MOUTH TWICE DAILY [name], MD  Active   HYDROcodone-acetaminophen [facility]) 10-325 MG tablet [id number]  Take 1 tablet by mouth every 6 (six) hours as needed. [name], MD  Active   MYRBETRIQ 50 MG TB24 tablet [id number] No TAKE 1 TABLET(50 MG) BY MOUTH DAILY  Patient not taking: Reported on 10/15/2021   [name], MD Not Taking Active   omeprazole (PRILOSEC) 40 MG capsule [id number]  TAKE 1 CAPSULE(40 MG) BY MOUTH DAILY [name], MD  Active   promethazine (PHENERGAN) 12.5 MG tablet [id number]  TAKE 1 TABLET(12.5 MG) BY MOUTH EVERY 8 HOURS AS NEEDED FOR NAUSEA OR VOMITING [name], MD  Active    spironolactone (ALDACTONE) 25 MG tablet [id number]  Take 0.5 tablets (12.5 mg total) by mouth daily. [name], MD  Active   torsemide (DEMADEX) 20 MG tablet [id number]  TAKE 1 TABLET BY MOUTH EVERY DAY [name], MD  Active   traZODone (DESYREL) 100 MG tablet [id number]  Take 1 tablet (100 mg total) by mouth at bedtime. [name], MD  Active   trolamine salicylate (ASPERCREME) 10 % cream [id number]  Apply 1 application topically as needed for muscle pain. [provider]  Active   Vibegron (GEMTESA) 75 MG TABS [id number] Yes Take 75 mg by mouth daily. [provider]  Active Self             Care Plan : Heart Failure (Adult)  Updates made by Hayden Pedro, RN since 10/15/2021 12:00 AM     Problem: Symptom Exacerbation (Heart Failure)      Long-Range Goal: Symptom Exacerbation Prevented or Minimized as evidence by no hospitalizations Completed 10/15/2021  Start Date: 09/04/2020  Expected End Date: 11/23/2021  Recent Progress: On track  Priority: High  Note:   Evidence-based guidance:  Perform or review cognitive and/or health literacy screening.  Assess understanding of adherence and barriers to treatment plan, as well as lifestyle changes; develop strategies to address barriers.  Establish a mutually-agreed-upon early intervention process to communicate with primary care provider when signs/symptoms worsen.  Facilitate timely posthospital discharge or emergency department treatment that includes intensive  follow-up via telephone calls, home visit, telehealth monitoring and care at multidisciplinary heart failure clinic.  Adjust frequency and intensity of follow-up based on presentation, number of emergency department visits, hospital admissions and frequency and severity of symptom exacerbation.  Facilitate timely visit, usually within 1 week, with primary care provider following hospital discharge.  Collaborate with clinical pharmacist  to address adverse drug reactions, drug interactions, subtherapeutic dosage, patient and family education.  Regularly screen for presence of depressive symptoms using a validated tool; consider pharmacologic therapy and/or referral for cognitive behavioral therapy when present.  Refer to community-based services, such as a heart failure support group, community Economist or peer support program.  Review immunization status; arrange receipt of needed vaccinations.  Prepare patient for home oxygen use based on signs/symptoms.   Notes:  12/26/20-RN CM reinforced to patient importance daily wgt monitoring, s/s of abnormal wgt gain and when to seek medical attention.   RN CM educated patient on the importance of adhering to med regimen   Barriers: Knowledge  03/28/21 RN CM discussed importance of adhering to treatment plan. RN CM reinforced to patient importance of daily wgt monitoring. RN CM reviewed wgt monitoring log.  05/29/2021 RN CM assessed for any acute issues/sxs at present. RN CM assessed for MD f/u appt and ensured pt know when and how to contact MD.    08/08/21 RN CM assessed for any acute issues/sxs at present. RN CM assessed for med adherence and compliance.  40/97/35-[id number] due to duplicate care plan and goals    Task: Identify and Minimize Risk of Heart Failure Exacerbation Completed 10/15/2021  Note:   Care Management Activities:    - barriers to lifestyle changes reviewed and addressed - healthy lifestyle promoted - medication-adherence assessment completed - self-awareness of signs/symptoms of worsening disease encouraged    Notes:    Care Plan : RN Care Manager POC  Updates made by Hayden Pedro, RN since 10/15/2021 12:00 AM     Problem: Chronic Disease Mgmt of Chronic Conditions(HF, OSA)   Priority: High     Long-Range Goal: Development of POC for Mgmt of Chronic Conditions(CHF, OSA)   Start Date: 10/15/2021  Expected End Date:  10/15/2022  Priority: High  Note:    Current Barriers:  Chronic Disease Management support and education needs related to CHF and OSA  RNCM Clinical Goal(s):  Patient will take all medications exactly as prescribed and will call provider for medication related questions attend all scheduled medical appointments: including PCP and specialists continue to work with RN Care Manager to address care management and care coordination needs related to  CHF and OSA  through collaboration with RN Care manager, provider, and care team.   Interventions: POC sent to PCP upon initial assessment, quarterly and with any changes in patient's conditions Inter-disciplinary care team collaboration (see longitudinal plan of care) Evaluation of current treatment plan related to  self management and patient's adherence to plan as established by provider  [facility] goal. Evaluation of current treatment plan related to  OSA ,  self-management and patient's adherence to plan as established by provider. Discussed plans with patient for ongoing care management follow up and provided patient with direct contact information for care management team Evaluation of current treatment plan related to OSA and patient's adherence to plan as established by provider; Reviewed medications with patient and discussed new current therapy med and results; patient reports she is resting better at night since starting new med(Gemtesa), pt using oxygen QHS as well  Heart Failure Interventions: Provided education on low sodium diet; Reviewed wgt monitoring log Reviewed low sodium diet and adherence measures Assessed for any swelling/edema or other sxs  Patient Goals/Self-Care Activities: Patient will self administer medications as prescribed Patient will attend all scheduled provider appointments Patient will continue to perform ADL's independently Patient will continue to monitor for HF sxs and wgt in the home  Follow Up Plan:   Telephone follow up appointment with care management team member scheduled for:  quarterly-within the month of Feb. The patient has been provided with contact information for the care management team and has been advised to call with any health related questions or concerns.        Plan: RN CM discussed with patient next outreach within the month of Feb Patient agrees to care plan and follow up.   [name], RN,BSN,CCM [place] [facility] Telephonic Care Management Coordinator Direct Phone: [phone] Toll Free: [phone] Fax: [phone]

## 2021-10-22 ENCOUNTER — Encounter: Payer: Self-pay | Admitting: Family Medicine

## 2021-10-22 ENCOUNTER — Ambulatory Visit (INDEPENDENT_AMBULATORY_CARE_PROVIDER_SITE_OTHER): Payer: Medicare HMO | Admitting: Family Medicine

## 2021-10-22 ENCOUNTER — Other Ambulatory Visit: Payer: Self-pay

## 2021-10-22 VITALS — BP 168/98 | HR 65 | Ht 62.0 in | Wt 200.2 lb

## 2021-10-22 DIAGNOSIS — M545 Low back pain, unspecified: Secondary | ICD-10-CM | POA: Diagnosis not present

## 2021-10-22 MED ORDER — TIZANIDINE HCL 4 MG PO TABS: 4.0000 mg | ORAL_TABLET | Freq: Three times a day (TID) | ORAL | 1 refills | Status: DC | PRN

## 2021-10-22 MED ORDER — PREDNISONE 50 MG PO TABS: 50.0000 mg | ORAL_TABLET | Freq: Every day | ORAL | 0 refills | Status: AC

## 2021-10-22 NOTE — Progress Notes (Signed)
   I, Wendy Poet, LAT, ATC, am serving as scribe for Dr. Lynne Leader.   [name] is a 87 y.o. female who presents to [place] at [facility] today for continued R hip pain. Pt was last seen by Dr. Georgina Snell on 08/30/20 and was referred to PT of which she completed 6 visits for B hip pain. Today, pt reports R-sided lower back pain x few weeks w/ no known MOI.  She states that she was trying to roll over in bed and has been hurting since trying to get out of bed after that.  She denies any radiating pain or paresthesias into her legs.  She has been using some topical pain relievers and has tried some prescription pain relievers previously prescribed by Dr. Ronnald Ramp.  Dx imaging: 08/24/20 R hip XR  Pertinent review of systems: No fevers or chills  Relevant historical information: Heart disease.   Exam:  BP (!) 168/98 (BP Location: Right Arm, Patient Position: Sitting, Cuff Size: Normal)   Pulse 65   Ht 5\' 2"  (1.575 m)   Wt 200 lb 3.2 oz (90.8 kg)   SpO2 94%   BMI 36.62 kg/m  General: Well Developed, well nourished, and in no acute distress.   MSK: L-spine.  Nontender midline.  Tender to palpation right lumbar paraspinal musculature.  Decreased lumbar motion.  Lower extremity strength is intact. No CV angle tenderness to percussion Abdomen is soft and nontender.   Lab and Radiology Results  X-ray images L-spine pelvis and hip visible on CT scan abdomen and pelvis June 2022 personally independently reviewed today. Mild L-spine DJD.  No severe hip DJD.     Assessment and Plan: 87 y.o. female with acute right low back pain occurring for the last 2 weeks.  Pain thought to be due to lumbosacral spasm and dysfunction.  Plan to treat with physical therapy referral tizanidine and short course of prednisone.  Also recommend using over-the-counter medication.  Recheck back in 6 weeks or sooner if needed.   PDMP not reviewed this encounter. Orders Placed This Encounter   Procedures   Ambulatory referral to Physical Therapy    Referral Priority:   Routine    Referral Type:   Physical Medicine    Referral Reason:   Specialty Services Required    Requested Specialty:   Physical Therapy    Number of Visits Requested:   1   Meds ordered this encounter  Medications   tiZANidine (ZANAFLEX) 4 MG tablet    Sig: Take 1 tablet (4 mg total) by mouth every 8 (eight) hours as needed for muscle spasms.    Dispense:  30 tablet    Refill:  1   predniSONE (DELTASONE) 50 MG tablet    Sig: Take 1 tablet (50 mg total) by mouth daily with breakfast for 5 days.    Dispense:  5 tablet    Refill:  0     Discussed warning signs or symptoms. Please see discharge instructions. Patient expresses understanding.   The above documentation has been reviewed and is accurate and complete Lynne Leader, M.D.

## 2021-10-22 NOTE — Patient Instructions (Addendum)
Good to see you today.  I've referred you to Physical Therapy at [facility].  Please let us know if you don't hear from them in one week regarding scheduling.  I've prescribed you Tizanidine and Prednisone.  Follow-up: 6 weeks

## 2021-10-23 ENCOUNTER — Other Ambulatory Visit: Payer: Self-pay | Admitting: Internal Medicine

## 2021-10-23 DIAGNOSIS — F411 Generalized anxiety disorder: Secondary | ICD-10-CM

## 2021-10-25 ENCOUNTER — Telehealth: Payer: Self-pay | Admitting: Internal Medicine

## 2021-10-25 NOTE — Telephone Encounter (Signed)
1.Medication Requested: HYDROcodone-acetaminophen (NORCO) 10-325 MG tablet  2. Pharmacy (Name, Street, [place]): [facility] [phone] - [place], [place] - [address] AT [place]  3. On Med List: yes  4. Last Visit with PCP: 09-19-2021  5. Next visit date with PCP: 03-20-2022   Agent: Please be advised that RX refills may take up to 3 business days. We ask that you follow-up with your pharmacy.

## 2021-10-27 ENCOUNTER — Other Ambulatory Visit: Payer: Self-pay | Admitting: Internal Medicine

## 2021-10-27 DIAGNOSIS — M150 Primary generalized (osteo)arthritis: Secondary | ICD-10-CM

## 2021-10-27 DIAGNOSIS — G894 Chronic pain syndrome: Secondary | ICD-10-CM

## 2021-10-27 DIAGNOSIS — M159 Polyosteoarthritis, unspecified: Secondary | ICD-10-CM

## 2021-10-27 DIAGNOSIS — Z515 Encounter for palliative care: Secondary | ICD-10-CM

## 2021-10-27 MED ORDER — HYDROCODONE-ACETAMINOPHEN 10-325 MG PO TABS: 1.0000 | ORAL_TABLET | Freq: Four times a day (QID) | ORAL | 0 refills | Status: DC | PRN

## 2021-11-01 ENCOUNTER — Other Ambulatory Visit: Payer: Self-pay | Admitting: Internal Medicine

## 2021-11-01 DIAGNOSIS — G4700 Insomnia, unspecified: Secondary | ICD-10-CM

## 2021-11-01 DIAGNOSIS — I5032 Chronic diastolic (congestive) heart failure: Secondary | ICD-10-CM

## 2021-11-01 DIAGNOSIS — I10 Essential (primary) hypertension: Secondary | ICD-10-CM

## 2021-11-27 ENCOUNTER — Telehealth: Payer: Self-pay

## 2021-11-27 ENCOUNTER — Emergency Department (HOSPITAL_COMMUNITY)
Admission: EM | Admit: 2021-11-27 | Discharge: 2021-11-27 | Disposition: A | Discharge status: Home / Self Care | Payer: Medicare HMO | Attending: Emergency Medicine | Admitting: Emergency Medicine

## 2021-11-27 ENCOUNTER — Emergency Department (HOSPITAL_COMMUNITY): Payer: Medicare HMO

## 2021-11-27 ENCOUNTER — Encounter (HOSPITAL_COMMUNITY): Payer: Self-pay | Admitting: *Deleted

## 2021-11-27 ENCOUNTER — Other Ambulatory Visit: Payer: Self-pay

## 2021-11-27 ENCOUNTER — Other Ambulatory Visit: Payer: Self-pay | Admitting: Internal Medicine

## 2021-11-27 DIAGNOSIS — R0902 Hypoxemia: Secondary | ICD-10-CM | POA: Diagnosis not present

## 2021-11-27 DIAGNOSIS — R0600 Dyspnea, unspecified: Secondary | ICD-10-CM | POA: Diagnosis not present

## 2021-11-27 DIAGNOSIS — R0689 Other abnormalities of breathing: Secondary | ICD-10-CM | POA: Diagnosis not present

## 2021-11-27 DIAGNOSIS — I509 Heart failure, unspecified: Secondary | ICD-10-CM | POA: Insufficient documentation

## 2021-11-27 DIAGNOSIS — R064 Hyperventilation: Secondary | ICD-10-CM | POA: Diagnosis not present

## 2021-11-27 DIAGNOSIS — F419 Anxiety disorder, unspecified: Secondary | ICD-10-CM | POA: Diagnosis not present

## 2021-11-27 DIAGNOSIS — G894 Chronic pain syndrome: Secondary | ICD-10-CM

## 2021-11-27 DIAGNOSIS — R350 Frequency of micturition: Secondary | ICD-10-CM | POA: Diagnosis not present

## 2021-11-27 DIAGNOSIS — R0602 Shortness of breath: Secondary | ICD-10-CM | POA: Diagnosis not present

## 2021-11-27 DIAGNOSIS — R457 State of emotional shock and stress, unspecified: Secondary | ICD-10-CM | POA: Diagnosis not present

## 2021-11-27 DIAGNOSIS — I10 Essential (primary) hypertension: Secondary | ICD-10-CM | POA: Diagnosis not present

## 2021-11-27 DIAGNOSIS — Z515 Encounter for palliative care: Secondary | ICD-10-CM

## 2021-11-27 DIAGNOSIS — Z79899 Other long term (current) drug therapy: Secondary | ICD-10-CM | POA: Insufficient documentation

## 2021-11-27 DIAGNOSIS — N3946 Mixed incontinence: Secondary | ICD-10-CM | POA: Diagnosis not present

## 2021-11-27 DIAGNOSIS — I517 Cardiomegaly: Secondary | ICD-10-CM | POA: Diagnosis not present

## 2021-11-27 DIAGNOSIS — M159 Polyosteoarthritis, unspecified: Secondary | ICD-10-CM

## 2021-11-27 LAB — CBC WITH DIFFERENTIAL/PLATELET
Abs Immature Granulocytes: 0.02 10*3/uL (ref 0.00–0.07)
Basophils Absolute: 0 10*3/uL (ref 0.0–0.1)
Basophils Relative: 0 %
Eosinophils Absolute: 0.2 10*3/uL (ref 0.0–0.5)
Eosinophils Relative: 2 %
HCT: 42 % (ref 36.0–46.0)
Hemoglobin: 13.1 g/dL (ref 12.0–15.0)
Immature Granulocytes: 0 %
Lymphocytes Relative: 23 %
Lymphs Abs: 1.9 10*3/uL (ref 0.7–4.0)
MCH: 30.6 pg (ref 26.0–34.0)
MCHC: 31.2 g/dL (ref 30.0–36.0)
MCV: 98.1 fL (ref 80.0–100.0)
Monocytes Absolute: 0.5 10*3/uL (ref 0.1–1.0)
Monocytes Relative: 6 %
Neutro Abs: 5.8 10*3/uL (ref 1.7–7.7)
Neutrophils Relative %: 69 %
Platelets: 164 10*3/uL (ref 150–400)
RBC: 4.28 MIL/uL (ref 3.87–5.11)
RDW: 13.2 % (ref 11.5–15.5)
WBC: 8.5 10*3/uL (ref 4.0–10.5)
nRBC: 0 % (ref 0.0–0.2)

## 2021-11-27 LAB — BASIC METABOLIC PANEL
Anion gap: 9 (ref 5–15)
BUN: 16 mg/dL (ref 8–23)
CO2: 27 mmol/L (ref 22–32)
Calcium: 9.8 mg/dL (ref 8.9–10.3)
Chloride: 103 mmol/L (ref 98–111)
Creatinine, Ser: 0.85 mg/dL (ref 0.44–1.00)
GFR, Estimated: >60 mL/min (ref >60)
Glucose, Bld: 112 mg/dL — ABNORMAL HIGH (ref 70–99)
Potassium: 3.5 mmol/L (ref 3.5–5.1)
Sodium: 139 mmol/L (ref 135–145)

## 2021-11-27 MED ORDER — HYDROCODONE-ACETAMINOPHEN 10-325 MG PO TABS: 1.0000 | ORAL_TABLET | Freq: Four times a day (QID) | ORAL | 0 refills | Status: DC | PRN

## 2021-11-27 NOTE — ED Triage Notes (Addendum)
Pt was at [facility], hyperventilating they states her sats were 88% EMS arrived and Sats 97%, Pt was out of her anxiety meds recently, started back taking them yesterday. Pt arrived with resolved anxiety

## 2021-11-27 NOTE — Telephone Encounter (Signed)
Pt call in requesting a refill on HYDROcodone-acetaminophen (NORCO) 10-325 MG tablet.  LOV 09/19/21 Pharmacy: [facility] [address]

## 2021-11-27 NOTE — ED Provider Notes (Signed)
[name] EMERGENCY DEPARTMENT Provider Note    CSN: [id number] Arrival date & time: 11/27/21 1339  History Chief Complaint  Patient presents with   Anxiety    [name]  is a 88 y.o. female with history of CHF and OSA wears oxygen at night. During the night she was woken up with SOB and a feeling of impending doom, she adjusted her nasal canula and turned her oxygen up and had near immediate relief. This morning she drove herself to a urology appointment (she doesn't wear oxygen during the day) and began feeling SOB after getting to their office. EMS was called and she was reportedly hypoxic to 88% which improved to 97% without intervention. She reports she has been feeling well since arriving her nearly 6 hours prior to my evaluation.    Home Medications Prior to Admission medications   Medication Sig Start Date End Date Taking? Authorizing Provider  albuterol (PROVENTIL) (2.5 MG/3ML) 0.083% nebulizer solution INHALE THE CONTENTS OF 1 VIAL VIA NEBULIZER TWICE DAILY AS NEEDED FOR WHEEZING OR SHORTNESS OF BREATH 09/30/21   [name], MD  carvedilol (COREG) 12.5 MG tablet Take 12.5 mg by mouth 2 (two) times daily with a meal.    [name], MD  Cholecalciferol 2000 units TABS Take 2 tablets (4,000 Units total) by mouth daily. 03/31/18   [name], MD  clonazePAM (KLONOPIN) 1 MG tablet TAKE 1 TABLET(1 MG) BY MOUTH TWICE DAILY AS NEEDED FOR ANXIETY 10/24/21   [name], MD  hydrALAZINE (APRESOLINE) 50 MG tablet TAKE 1 TABLET(50 MG) BY MOUTH TWICE DAILY 09/03/21   [name], MD  HYDROcodone-acetaminophen [facility]) 10-325 MG tablet Take 1 tablet by mouth every 6 (six) hours as needed. 11/27/21   [name], MD  MYRBETRIQ 50 MG TB24 tablet TAKE 1 TABLET(50 MG) BY MOUTH DAILY Patient not taking: Reported on 10/15/2021 08/19/21   [name], MD  omeprazole (PRILOSEC) 40 MG capsule TAKE 1 CAPSULE(40 MG) BY MOUTH DAILY 01/03/21   [name], MD   promethazine (PHENERGAN) 12.5 MG tablet TAKE 1 TABLET(12.5 MG) BY MOUTH EVERY 8 HOURS AS NEEDED FOR NAUSEA OR VOMITING 07/19/21   [name], MD  spironolactone (ALDACTONE) 25 MG tablet Take 0.5 tablets (12.5 mg total) by mouth daily. 08/06/21   [name], MD  tiZANidine (ZANAFLEX) 4 MG tablet Take 1 tablet (4 mg total) by mouth every 8 (eight) hours as needed for muscle spasms. 10/22/21   Gregor Hams, MD  torsemide (DEMADEX) 20 MG tablet TAKE 1 TABLET BY MOUTH EVERY DAY 11/01/21   [name], MD  traZODone (DESYREL) 100 MG tablet TAKE 1 TABLET(100 MG) BY MOUTH AT BEDTIME 11/01/21   [name], MD  trolamine salicylate (ASPERCREME) 10 % cream Apply 1 application topically as needed for muscle pain.    [provider]  Vibegron (GEMTESA) 75 MG TABS Take 75 mg by mouth daily.    [provider]     Allergies    Amlodipine, Enalapril, Losartan potassium, Morphine and related, and Metformin   Review of Systems   Review of Systems Please see HPI for pertinent positives and negatives  Physical Exam BP 120/64 (BP Location: Left Arm)    Pulse 82    Temp 98.3 F (36.8 C) (Oral)    Resp 20    SpO2 97%   Physical Exam Vitals and nursing note reviewed.  Constitutional:      Appearance: Normal appearance.  HENT:     Head: Normocephalic and atraumatic.     Nose: Nose normal.     Mouth/Throat:     Mouth: Mucous membranes are moist.  Eyes:     Extraocular Movements: Extraocular movements intact.     Conjunctiva/sclera: Conjunctivae normal.  Cardiovascular:     Rate and Rhythm: Normal rate.  Pulmonary:     Effort: Pulmonary effort is normal.     Breath sounds: Normal breath sounds.  Abdominal:     General: Abdomen is flat.     Palpations: Abdomen is soft.     Tenderness: There is no abdominal tenderness.  Musculoskeletal:        General: No swelling. Normal range of motion.     Cervical back: Neck supple.  Skin:    General: Skin is warm and dry.   Neurological:     General: No focal deficit present.     Mental Status: She is alert.  Psychiatric:        Mood and Affect: Mood normal.    ED Results / Procedures / Treatments   EKG EKG Interpretation  Date/Time:  Tuesday November 27 2021 18:45:08 EST Ventricular Rate:  73 PR Interval:  157 QRS Duration: 80 QT Interval:  370 QTC Calculation: 408 R Axis:   63 Text Interpretation: Sinus rhythm Anteroseptal infarct, old Repol abnrm suggests ischemia, lateral leads No significant change since last tracing Confirmed by Calvert Cantor [phone]) on 11/27/2021 6:56:03 PM  Procedures Procedures  Medications Ordered in the ED Medications - No data to display  Initial Impression and Plan  Patient with history of anxiety as well as chronic respiratory failure here with some combination of both. She is resting comfortably, no distress on room air. Will check ambulatory pulse ox, CBC, BMP, CXR and EKG done in triage are all at baseline. No acute process identified.   ED Course   Clinical Course as of 11/27/21 1928  Tue Nov 27, 2021  1927 Patient maintained SpO2 94% with ambulation. She is otherwise well appearing and wants to go home. Recommend close PCP follow up. RTED for any other concerns.  [CS]    Clinical Course User Index [CS] Truddie Hidden, MD     MDM Rules/Calculators/A&P Medical Decision Making Problems Addressed: Dyspnea, unspecified type: chronic illness or injury with exacerbation, progression, or side effects of treatment  Amount and/or Complexity of Data Reviewed Labs:  Decision-making details documented in ED Course. Radiology:  Decision-making details documented in ED Course. ECG/medicine tests: independent interpretation performed.    Final Clinical Impression(s) / ED Diagnoses Final diagnoses:  Dyspnea, unspecified type    Rx / DC Orders ED Discharge Orders     None        Truddie Hidden, MD 11/27/21 Kathyrn Drown

## 2021-11-27 NOTE — ED Provider Notes (Signed)
Emergency Medicine Provider Triage Evaluation Note  [name] , a 88 y.o. female  was evaluated in triage.  Pt complains of hyperventilation and difficulty breathing last night and into this morning.  Patient normally sleeps on oxygen at night because she is been told that she occasionally stops breathing.  She woke up at one-point and said that she "knew that she was going to die", and she was unable to breathe, take deep breaths.  She managed to reach over and increase her to max flow symptoms resolved over about 3 minutes.  She denies chest pain.  She is normally on Klonopin for anxiety, but was unable to fill it at 11/23/2021 because of the closed pharmacies for the holidays.  She has never had a panic attack before so is unsure if that is what she had.  She had a urology appointment today for management of her ongoing UTI, and per EMS, her O2 was at 88%.  Review of Systems  Positive: Anxiety, shortness of breath Negative: Abdominal pain, N/V/D  Physical Exam  BP 120/64 (BP Location: Left Arm)    Pulse 82    Temp 98.3 F (36.8 C) (Oral)    Resp 20    SpO2 97%  Gen:   Awake, no distress  Resp:  Normal effort, she is able to speak in complete sentences, however takes very deep breaths to catch her breath. MSK:   Moves extremities without difficulty  Other:    Medical Decision Making  Medically screening exam initiated at 2:10 PM.  Appropriate orders placed.  TEJASVI BRISSETT was informed that the remainder of the evaluation will be completed by another provider, this initial triage assessment does not replace that evaluation, and the importance of remaining in the ED until their evaluation is complete.     Tonye Pearson, [place] 11/27/21 1413    Regan Lemming, MD 11/27/21 1450

## 2021-11-27 NOTE — ED Notes (Signed)
Ambulating pulse ox 94% on room air. Mildly dypsneic while ambulating.

## 2021-11-29 ENCOUNTER — Telehealth: Payer: Self-pay

## 2021-11-29 NOTE — Progress Notes (Signed)
Chronic Care Management Pharmacy Assistant   Name: [name]  MRN: [id number] DOB: 04-20-1933  Reason for Encounter: Disease State   Conditions to be addressed/monitored: HTN   Recent office visits:  09/19/21 [name], MD-PCP (Annual Exam) Labs ordered, no med changes  Recent consult visits:  10/22/21 Gregor Hams, MD-Sports Medicine (Acute right-sided low back pain without sciatica) Referral to Physical Therapy, Med changes: prednisone 50 mg and tizanidine 4 mg  08/06/21 Sherrilyn Rist A, MD-Pulmonary Disease (SOB) No orders or med changes  Hospital visits:  Medication Reconciliation was completed by comparing discharge summary, patients EMR and Pharmacy list, and upon discussion with patient.  Admitted to the hospital on 11/27/21 due to SOB. Discharge date was 11/27/21. Discharged from [facility] Emergency Dept.   Medications that remain the same after Hospital Discharge:??  -All other medications will remain the same.    Medications: Outpatient Encounter Medications as of 11/29/2021  Medication Sig   albuterol (PROVENTIL) (2.5 MG/3ML) 0.083% nebulizer solution INHALE THE CONTENTS OF 1 VIAL VIA NEBULIZER TWICE DAILY AS NEEDED FOR WHEEZING OR SHORTNESS OF BREATH   carvedilol (COREG) 12.5 MG tablet Take 12.5 mg by mouth 2 (two) times daily with a meal.   Cholecalciferol 2000 units TABS Take 2 tablets (4,000 Units total) by mouth daily.   clonazePAM (KLONOPIN) 1 MG tablet TAKE 1 TABLET(1 MG) BY MOUTH TWICE DAILY AS NEEDED FOR ANXIETY   hydrALAZINE (APRESOLINE) 50 MG tablet TAKE 1 TABLET(50 MG) BY MOUTH TWICE DAILY   HYDROcodone-acetaminophen (NORCO) 10-325 MG tablet Take 1 tablet by mouth every 6 (six) hours as needed.   MYRBETRIQ 50 MG TB24 tablet TAKE 1 TABLET(50 MG) BY MOUTH DAILY (Patient not taking: Reported on 10/15/2021)   omeprazole (PRILOSEC) 40 MG capsule TAKE 1 CAPSULE(40 MG) BY MOUTH DAILY   promethazine (PHENERGAN) 12.5 MG tablet TAKE  1 TABLET(12.5 MG) BY MOUTH EVERY 8 HOURS AS NEEDED FOR NAUSEA OR VOMITING   spironolactone (ALDACTONE) 25 MG tablet Take 0.5 tablets (12.5 mg total) by mouth daily.   tiZANidine (ZANAFLEX) 4 MG tablet Take 1 tablet (4 mg total) by mouth every 8 (eight) hours as needed for muscle spasms.   torsemide (DEMADEX) 20 MG tablet TAKE 1 TABLET BY MOUTH EVERY DAY   traZODone (DESYREL) 100 MG tablet TAKE 1 TABLET(100 MG) BY MOUTH AT BEDTIME   trolamine salicylate (ASPERCREME) 10 % cream Apply 1 application topically as needed for muscle pain.   Vibegron (GEMTESA) 75 MG TABS Take 75 mg by mouth daily.   No facility-administered encounter medications on file as of 11/29/2021.    Recent Office Vitals: BP Readings from Last 3 Encounters:  11/27/21 (!) 179/73  10/22/21 (!) 168/98  09/19/21 128/82   Pulse Readings from Last 3 Encounters:  11/27/21 73  10/22/21 65  09/19/21 74    Wt Readings from Last 3 Encounters:  10/22/21 200 lb 3.2 oz (90.8 kg)  09/19/21 198 lb (89.8 kg)  08/06/21 195 lb 12.8 oz (88.8 kg)     Kidney Function Lab Results  Component Value Date/Time   CREATININE 0.85 11/27/2021 05:03 PM   CREATININE 0.70 09/19/2021 01:35 PM   GFR 78.22 09/19/2021 01:35 PM   GFRNONAA >60 11/27/2021 05:03 PM   GFRAA >60 08/13/2020 05:47 AM    BMP Latest Ref Rng & Units 11/27/2021 09/19/2021 04/29/2021  Glucose 70 - 99 mg/dL 112(H) 90 113(H)  BUN 8 - 23 mg/dL 16 14 11   Creatinine 0.44 -  1.00 mg/dL 0.85 0.70 0.69  BUN/Creat Ratio 12 - 28 - - -  Sodium 135 - 145 mmol/L 139 139 140  Potassium 3.5 - 5.1 mmol/L 3.5 4.0 3.7  Chloride 98 - 111 mmol/L 103 101 105  CO2 22 - 32 mmol/L 27 31 28   Calcium 8.9 - 10.3 mg/dL 9.8 9.6 9.0   Reviewed chart prior to disease state call. Spoke with patient regarding BP  Current antihypertensive regimen:  carvedilol 12.5 mg BID  hydralazine 50 mg BID spironolactone 12.5 mg (1/2 of 25 mg) daily  torsemide 20 mg daily  How often are you checking your Blood  Pressure? daily  Current home BP readings: 140/70 last night before bed  What recent interventions/DTPs have been made by any provider to improve Blood Pressure control since last CPP Visit: None noted  Any recent hospitalizations or ED visits since last visit with CPP? Yes, for sob  What diet changes have been made to improve Blood Pressure Control?  Patient eats very little salt, pours the juice out of can goods  What exercise is being done to improve your Blood Pressure Control?  Patient states that she does what she can to exercise like walking as much as she can  Adherence Review: Is the patient currently on ACE/ARB medication? No Does the patient have >5 day gap between last estimated fill dates? No   Care Gaps: Colonoscopy-NA Diabetic Foot Exam-NA Mammogram-NA Ophthalmology-NA Dexa Scan - NA Annual Well Visit - 09/19/21 Micro albumin-NA Hemoglobin A1c-09/19/21  Star Rating Drugs: None ID  Ethelene Hal Clinical Pharmacist Assistant [phone]

## 2021-12-03 ENCOUNTER — Ambulatory Visit: Payer: Medicare HMO | Admitting: Family Medicine

## 2021-12-12 ENCOUNTER — Telehealth: Payer: Self-pay | Admitting: Cardiology

## 2021-12-12 NOTE — Telephone Encounter (Signed)
Stat call from operators  Patient is currently on oxygen. Patient is not short of breath on phone. She states she is only short of breath when she over exerts herself and has to sit down and do a breathing treatment.    Says O2 is for the night time use. Patient states for a long time keeping her blood pressure under control helped her to breath better. She travelled to [place] at [place] and ever since returning. She says it is worse all the time.    Patient states she does regularly check her bp but does not keep a log. This am her BP is 140/70. Patient states her blood pressure ranges from 102-140ish. Patient states she has to wake up and immediately do a breathing treatment.    Advised patient that since she was having breathing issues it would be best to be seen either at the urgent care or ED. Patient apprehensive about going to the Ed. Advised patient that since we did not prescribe her oxygen use and it is only for night time use she needs to follow up with her doctor that instructs her on her oxygen use.

## 2021-12-12 NOTE — Telephone Encounter (Signed)
Pt c/o Shortness Of Breath: STAT if SOB developed within the last 24 hours or pt is noticeably SOB on the phone  1. Are you currently SOB (can you hear that pt is SOB on the phone)? Yes   2. How long have you been experiencing SOB? Since around 11/15/21  3. Are you SOB when sitting or when up moving around? Both   4. Are you currently experiencing any other symptoms? Patient is currently on oxygen

## 2021-12-13 ENCOUNTER — Encounter: Payer: Self-pay | Admitting: Pulmonary Disease

## 2021-12-13 ENCOUNTER — Other Ambulatory Visit: Payer: Self-pay

## 2021-12-13 ENCOUNTER — Ambulatory Visit: Payer: Medicare HMO | Admitting: Pulmonary Disease

## 2021-12-13 VITALS — BP 120/72 | HR 65 | Temp 98.0°F | Ht 62.0 in | Wt 203.0 lb

## 2021-12-13 DIAGNOSIS — R0602 Shortness of breath: Secondary | ICD-10-CM | POA: Diagnosis not present

## 2021-12-13 DIAGNOSIS — G4733 Obstructive sleep apnea (adult) (pediatric): Secondary | ICD-10-CM | POA: Diagnosis not present

## 2021-12-13 DIAGNOSIS — E119 Type 2 diabetes mellitus without complications: Secondary | ICD-10-CM | POA: Diagnosis not present

## 2021-12-13 NOTE — Addendum Note (Signed)
Addended by: Dessie Coma on: 12/13/2021 05:44 PM   Modules accepted: Orders

## 2021-12-13 NOTE — Patient Instructions (Signed)
DME referral for CPAP supplies  Start trying to use her CPAP on a nightly basis as tolerated  Wean yourself off the [place] slowly  Call with significant concerns  Follow-up in 3 months

## 2021-12-13 NOTE — Progress Notes (Signed)
Subjective:    Patient ID: [name], female    DOB: 1933-06-09, 88 y.o.   MRN: [id number]  Patient being seen for history of obstructive sleep apnea  She was diagnosed with obstructive sleep apnea and started on CPAP therapy Was not able to get used to CPAP Has been using oxygen supplementation  She feels she wants to go back on CPAP I encouraged her to start using CPAP nightly as she can tolerate She does need some supplies  She is trying to wean herself off using Norco  Has sleep study had shown obstructive sleep apnea with significant desaturations, since she is not able to tolerate CPAP, she is wondering whether she may qualify for oxygen supplementation  Discontinued CPAP use because she could not get used to it  Diagnosed with obstructive sleep apnea in 2013  Shortness of breath with moderate exertion  Goes to bed about 10-11 PM Takes about 35 minutes to 40 minutes to fall asleep  Wakes up in the morning about 5 AM, takes her few hours to finally get out of bed sometimes up until 10 AM  She still has a CPAP but needs supplies  Past Medical History:  Diagnosis Date   Abdominal pain    Anemia    NOS   Anxiety    Back pain    Bruises easily    Chronic diastolic CHF (congestive heart failure) ([place])    a. 06/2013 EF 65-70%.   Chronic headaches    HISTORY OF    Depression    Diabetes mellitus    type II  DIET CONTROLLED   Diarrhea    Hepatitis    AGE 30S   Hyperlipidemia    Hypertension    Mild aortic stenosis    a. 06/2013 Echo: EF 65-70%, mod LVH with focal basal hypertrophy, very mild AS.   Osteoarthritis    Oxygen desaturation during sleep    USES 2 LITERS BEDTIME   VIA CPAP 06/2012 [facility]    Shortness of breath    WITH EXERTION USES 2 L O2 BEDTIME   Sleep apnea    CPAP WITH O2 2 LITERS 2013 ([facility])   Wears dentures    Wears glasses    Family History  Problem Relation Age of Onset   Cancer Father        prostate cancer   Heart disease  Sister    Heart disease Brother    Alcohol abuse Other    Hypertension Other    Kidney disease Other    Mental illness Other    Emphysema Brother    Social History   Socioeconomic History   Marital status: Widowed    Spouse name: Not on file   Number of children: 5   Years of education: Not on file   Highest education level: Not on file  Occupational History   Occupation: retired     Fish farm manager: RETIRED    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[employer] Use   Smoking status: Never   Smokeless tobacco: Never  Vaping Use   Vaping Use: Never used  Substance and Sexual Activity   Alcohol use: No   Drug use: No   Sexual activity: Never  Other Topics Concern   Not on file  Social History Narrative   Not on file   Social Determinants of Health   Financial Resource Strain: Low Risk    Difficulty of Paying Living Expenses: Not hard at all  Food Insecurity:  No Food Insecurity   Worried About Charity fundraiser in the Last Year: Never true   Ran Out of Food in the Last Year: Never true  Transportation Needs: No Transportation Needs   Lack of Transportation (Medical): No   Lack of Transportation (Non-Medical): No  Physical Activity: Sufficiently Active   Days of Exercise per Week: 5 days   Minutes of Exercise per Session: 30 min  Stress: No Stress Concern Present   Feeling of Stress : Not at all  Social Connections: Moderately Integrated   Frequency of Communication with Friends and Family: More than three times a week   Frequency of Social Gatherings with Friends and Family: More than three times a week   Attends Religious Services: More than 4 times per year   Active Member of Clubs or Organizations: No   Attends Music therapist: More than 4 times per year   Marital Status: Widowed  Intimate Partner Violence: Not on file   Review of Systems  Constitutional:  Negative for fever and unexpected weight change.  HENT:  Positive for congestion and trouble swallowing.  Negative for dental problem, ear pain, nosebleeds, postnasal drip, rhinorrhea, sinus pressure, sneezing and sore throat.   Eyes:  Negative for redness and itching.  Respiratory:  Positive for shortness of breath. Negative for cough, chest tightness and wheezing.   Cardiovascular:  Positive for leg swelling. Negative for palpitations.  Gastrointestinal:  Negative for nausea and vomiting.  Genitourinary:  Negative for dysuria.  Musculoskeletal:  Negative for joint swelling.  Skin:  Negative for rash.  Allergic/Immunologic: Negative.  Negative for environmental allergies, food allergies and immunocompromised state.  Neurological:  Negative for headaches.  Hematological:  Bruises/bleeds easily.  Psychiatric/Behavioral:  Negative for dysphoric mood. The patient is nervous/anxious.       Objective:   Physical Exam Constitutional:      Appearance: She is obese.  HENT:     Head: Normocephalic and atraumatic.     Nose: No congestion.  Eyes:     Pupils: Pupils are equal, round, and reactive to light.  Cardiovascular:     Rate and Rhythm: Normal rate and regular rhythm.  Pulmonary:     Effort: Pulmonary effort is normal. No respiratory distress.     Breath sounds: Normal breath sounds. No stridor. No wheezing or rhonchi.  Musculoskeletal:     Cervical back: No rigidity or tenderness.  Neurological:     Mental Status: She is alert.  Psychiatric:        Mood and Affect: Mood normal.   Vitals:   12/13/21 1436  BP: 120/72  Pulse: 65  Temp: 98 F (36.7 C)  SpO2: 95%   Has been using oxygen supplementation but feels she may be better off using CPAP      Assessment & Plan:  .  Nonrestorative sleep .  Daytime sleepiness .  Nocturnal desaturations .  Prior history of intolerance to CPAP  .  Patient wants to reinitiate CPAP use .  We will contact DME company for CPAP supplies .  Encouraged to transition from oxygen use to CPAP use and whenever she wakes up feels she can tolerate  CPAP, she may put oxygen on at that time .  Graded exercise as tolerated .  Discussed about desire to wean herself off Norco-she is already using less of it at present  I will see her back in about 3 months  Encouraged to call with any significant concerns

## 2021-12-26 ENCOUNTER — Other Ambulatory Visit: Payer: Self-pay | Admitting: Internal Medicine

## 2021-12-26 ENCOUNTER — Telehealth: Payer: Self-pay | Admitting: Internal Medicine

## 2021-12-26 DIAGNOSIS — M159 Polyosteoarthritis, unspecified: Secondary | ICD-10-CM

## 2021-12-26 DIAGNOSIS — G894 Chronic pain syndrome: Secondary | ICD-10-CM

## 2021-12-26 DIAGNOSIS — Z515 Encounter for palliative care: Secondary | ICD-10-CM

## 2021-12-26 MED ORDER — HYDROCODONE-ACETAMINOPHEN 10-325 MG PO TABS: 1.0000 | ORAL_TABLET | Freq: Four times a day (QID) | ORAL | 0 refills | Status: DC | PRN

## 2021-12-26 NOTE — Telephone Encounter (Signed)
1.Medication Requested: HYDROcodone-acetaminophen (NORCO) 10-325 MG tablet  2. Pharmacy (Name, Street, [place]): [facility] [phone] - [place], [place] - [address] AT [address]  3. On Med List: yes  4. Last Visit with PCP: 09-19-2021  5. Next visit date with PCP: 03-20-2022   Agent: Please be advised that RX refills may take up to 3 business days. We ask that you follow-up with your pharmacy.

## 2021-12-27 ENCOUNTER — Telehealth: Payer: Self-pay

## 2021-12-27 NOTE — Chronic Care Management (AMB) (Signed)
Error - duplicate

## 2021-12-27 NOTE — Chronic Care Management (AMB) (Deleted)
Chronic Care Management Pharmacy Assistant   Name: [name]  MRN: [id number] DOB: 21-Jan-1933   Reason for Encounter: Disease State   Conditions to be addressed/monitored: HTN   Recent office visits:  None ID  Recent consult visits:  12/13/21 Laurin Coder, MD-Pulmonary Disease ([facility])   Hospital visits:  None since last coordination call  Medications: Outpatient Encounter Medications as of 12/27/2021  Medication Sig   albuterol (PROVENTIL) (2.5 MG/3ML) 0.083% nebulizer solution INHALE THE CONTENTS OF 1 VIAL VIA NEBULIZER TWICE DAILY AS NEEDED FOR WHEEZING OR SHORTNESS OF BREATH   carvedilol (COREG) 12.5 MG tablet Take 12.5 mg by mouth 2 (two) times daily with a meal.   Cholecalciferol 2000 units TABS Take 2 tablets (4,000 Units total) by mouth daily.   clonazePAM (KLONOPIN) 1 MG tablet TAKE 1 TABLET(1 MG) BY MOUTH TWICE DAILY AS NEEDED FOR ANXIETY   hydrALAZINE (APRESOLINE) 50 MG tablet TAKE 1 TABLET(50 MG) BY MOUTH TWICE DAILY   HYDROcodone-acetaminophen (NORCO) 10-325 MG tablet Take 1 tablet by mouth every 6 (six) hours as needed.   MYRBETRIQ 50 MG TB24 tablet TAKE 1 TABLET(50 MG) BY MOUTH DAILY   omeprazole (PRILOSEC) 40 MG capsule TAKE 1 CAPSULE(40 MG) BY MOUTH DAILY   promethazine (PHENERGAN) 12.5 MG tablet TAKE 1 TABLET(12.5 MG) BY MOUTH EVERY 8 HOURS AS NEEDED FOR NAUSEA OR VOMITING   spironolactone (ALDACTONE) 25 MG tablet Take 0.5 tablets (12.5 mg total) by mouth daily.   tiZANidine (ZANAFLEX) 4 MG tablet Take 1 tablet (4 mg total) by mouth every 8 (eight) hours as needed for muscle spasms.   torsemide (DEMADEX) 20 MG tablet TAKE 1 TABLET BY MOUTH EVERY DAY   traZODone (DESYREL) 100 MG tablet TAKE 1 TABLET(100 MG) BY MOUTH AT BEDTIME   trolamine salicylate (ASPERCREME) 10 % cream Apply 1 application topically as needed for muscle pain.   Vibegron (GEMTESA) 75 MG TABS Take 75 mg by mouth daily.   No facility-administered encounter medications on file as of  12/27/2021.   Reviewed chart prior to disease state call. Spoke with patient regarding BP  Recent Office Vitals: BP Readings from Last 3 Encounters:  12/13/21 120/72  11/27/21 (!) 179/73  10/22/21 (!) 168/98   Pulse Readings from Last 3 Encounters:  12/13/21 65  11/27/21 73  10/22/21 65    Wt Readings from Last 3 Encounters:  12/13/21 203 lb (92.1 kg)  10/22/21 200 lb 3.2 oz (90.8 kg)  09/19/21 198 lb (89.8 kg)     Kidney Function Lab Results  Component Value Date/Time   CREATININE 0.85 11/27/2021 05:03 PM   CREATININE 0.70 09/19/2021 01:35 PM   GFR 78.22 09/19/2021 01:35 PM   GFRNONAA >60 11/27/2021 05:03 PM   GFRAA >60 08/13/2020 05:47 AM    BMP Latest Ref Rng & Units 11/27/2021 09/19/2021 04/29/2021  Glucose 70 - 99 mg/dL 112(H) 90 113(H)  BUN 8 - 23 mg/dL 16 14 11   Creatinine 0.44 - 1.00 mg/dL 0.85 0.70 0.69  BUN/Creat Ratio 12 - 28 - - -  Sodium 135 - 145 mmol/L 139 139 140  Potassium 3.5 - 5.1 mmol/L 3.5 4.0 3.7  Chloride 98 - 111 mmol/L 103 101 105  CO2 22 - 32 mmol/L 27 31 28   Calcium 8.9 - 10.3 mg/dL 9.8 9.6 9.0    Current antihypertensive regimen:  carvedilol 12.5 mg BID  hydralazine 50 mg BID spironolactone 12.5 mg (1/2 of 25 mg) daily  torsemide 20 mg daily  How often  are you checking your Blood Pressure? {CHL HP BP Monitoring Frequency:[phone]}  Current home BP readings: ***  What recent interventions/DTPs have been made by any provider to improve Blood Pressure control since last CPP Visit: ***  Any recent hospitalizations or ED visits since last visit with CPP? {yes/no:20286}  What diet changes have been made to improve Blood Pressure Control?  *** What exercise is being done to improve your Blood Pressure Control?  ***  Adherence Review: Is the patient currently on ACE/ARB medication? No Does the patient have >5 day gap between last estimated fill dates? No    Care Gaps: Colonoscopy-NA Diabetic Foot  Exam-NA Mammogram-NA Ophthalmology-NA Dexa Scan - NA Annual Well Visit - 09/19/21 Micro albumin-NA Hemoglobin A1c-09/19/21  Star Rating Drugs: None ID   Ethelene Hal Clinical Pharmacist Assistant [phone]

## 2022-01-01 NOTE — Chronic Care Management (AMB) (Signed)
Chronic Care Management Pharmacy Assistant   Name: [name]  MRN: [id number] DOB: 1932/12/17   Reason for Encounter: Disease State   Conditions to be addressed/monitored: HTN  Recent office visits:  None ID  Recent consult visits:  12/13/21 Laurin Coder, MD-Pulmonary Disease ([facility]) Orders: Referral for DME, Med changes: none  Hospital visits:  None in previous 6 months  Medications: Outpatient Encounter Medications as of 12/27/2021  Medication Sig   albuterol (PROVENTIL) (2.5 MG/3ML) 0.083% nebulizer solution INHALE THE CONTENTS OF 1 VIAL VIA NEBULIZER TWICE DAILY AS NEEDED FOR WHEEZING OR SHORTNESS OF BREATH   carvedilol (COREG) 12.5 MG tablet Take 12.5 mg by mouth 2 (two) times daily with a meal.   Cholecalciferol 2000 units TABS Take 2 tablets (4,000 Units total) by mouth daily.   clonazePAM (KLONOPIN) 1 MG tablet TAKE 1 TABLET(1 MG) BY MOUTH TWICE DAILY AS NEEDED FOR ANXIETY   hydrALAZINE (APRESOLINE) 50 MG tablet TAKE 1 TABLET(50 MG) BY MOUTH TWICE DAILY   HYDROcodone-acetaminophen (NORCO) 10-325 MG tablet Take 1 tablet by mouth every 6 (six) hours as needed.   MYRBETRIQ 50 MG TB24 tablet TAKE 1 TABLET(50 MG) BY MOUTH DAILY   omeprazole (PRILOSEC) 40 MG capsule TAKE 1 CAPSULE(40 MG) BY MOUTH DAILY   promethazine (PHENERGAN) 12.5 MG tablet TAKE 1 TABLET(12.5 MG) BY MOUTH EVERY 8 HOURS AS NEEDED FOR NAUSEA OR VOMITING   spironolactone (ALDACTONE) 25 MG tablet Take 0.5 tablets (12.5 mg total) by mouth daily.   tiZANidine (ZANAFLEX) 4 MG tablet Take 1 tablet (4 mg total) by mouth every 8 (eight) hours as needed for muscle spasms.   torsemide (DEMADEX) 20 MG tablet TAKE 1 TABLET BY MOUTH EVERY DAY   traZODone (DESYREL) 100 MG tablet TAKE 1 TABLET(100 MG) BY MOUTH AT BEDTIME   trolamine salicylate (ASPERCREME) 10 % cream Apply 1 application topically as needed for muscle pain.   Vibegron (GEMTESA) 75 MG TABS Take 75 mg by mouth daily.   No facility-administered  encounter medications on file as of 12/27/2021.   Reviewed chart prior to disease state call. Spoke with patient regarding BP  Recent Office Vitals: BP Readings from Last 3 Encounters:  12/13/21 120/72  11/27/21 (!) 179/73  10/22/21 (!) 168/98   Pulse Readings from Last 3 Encounters:  12/13/21 65  11/27/21 73  10/22/21 65    Wt Readings from Last 3 Encounters:  12/13/21 203 lb (92.1 kg)  10/22/21 200 lb 3.2 oz (90.8 kg)  09/19/21 198 lb (89.8 kg)     Kidney Function Lab Results  Component Value Date/Time   CREATININE 0.85 11/27/2021 05:03 PM   CREATININE 0.70 09/19/2021 01:35 PM   GFR 78.22 09/19/2021 01:35 PM   GFRNONAA >60 11/27/2021 05:03 PM   GFRAA >60 08/13/2020 05:47 AM    BMP Latest Ref Rng & Units 11/27/2021 09/19/2021 04/29/2021  Glucose 70 - 99 mg/dL 112(H) 90 113(H)  BUN 8 - 23 mg/dL 16 14 11   Creatinine 0.44 - 1.00 mg/dL 0.85 0.70 0.69  BUN/Creat Ratio 12 - 28 - - -  Sodium 135 - 145 mmol/L 139 139 140  Potassium 3.5 - 5.1 mmol/L 3.5 4.0 3.7  Chloride 98 - 111 mmol/L 103 101 105  CO2 22 - 32 mmol/L 27 31 28   Calcium 8.9 - 10.3 mg/dL 9.8 9.6 9.0    Current antihypertensive regimen:  carvedilol 12.5 mg BID  hydralazine 50 mg BID spironolactone 12.5 mg (1/2 of 25 mg) daily  torsemide 20  mg daily  How often are you checking your Blood Pressure? daily, before she goes to bed  Current home BP readings: 140/78 this am  What recent interventions/DTPs have been made by any provider to improve Blood Pressure control since last CPP Visit: None noted  Any recent hospitalizations or ED visits since last visit with CPP? No, not since last coordination call  What diet changes have been made to improve Blood Pressure Control?  Patient states she has not made any new changes to her diet  What exercise is being done to improve your Blood Pressure Control?  Patient states that she exercises if she can hold on to something and tries to do so whenever she  can  Adherence Review: Is the patient currently on ACE/ARB medication? No Does the patient have >5 day gap between last estimated fill dates? No   Care Gaps: Colonoscopy-NA Diabetic Foot Exam-NA Mammogram-NA Ophthalmology-NA Dexa Scan - NA Annual Well Visit - 09/19/21 Micro albumin-NA Hemoglobin A1c-09/19/21  Star Rating Drugs: None ID  Ethelene Hal Clinical Pharmacist Assistant [phone]

## 2022-01-14 ENCOUNTER — Other Ambulatory Visit: Payer: Self-pay

## 2022-01-14 NOTE — Patient Outreach (Signed)
[name]) Care Management  01/14/2022  [name] September 27, 1933 [id number]   Case Closure   Case has been transferred to [place] program and patient to be followed by assigned RN CM at PCP office.    Plan: RN CM will close case.  [name], RN,BSN,CCM [place] [facility] Telephonic Care Management Coordinator Direct Phone: [phone] Toll Free: [phone] Fax: [phone]

## 2022-01-15 ENCOUNTER — Telehealth: Payer: Self-pay | Admitting: *Deleted

## 2022-01-15 NOTE — Chronic Care Management (AMB) (Signed)
°  Care Management   Note  01/15/2022 Name: [name] MRN: [id number] DOB: Nov 10, 1933  [name] is a 88 y.o. year old female who is a primary care patient of [name], MD and is actively engaged with the care management team. I reached out to [name] by phone today to assist with scheduling an initial visit with the RN Case Manager  Follow up plan: Telephone appointment with care management team member scheduled for:01/16/22  [place], [place] [facility]  Direct Dial: [phone]

## 2022-01-16 ENCOUNTER — Ambulatory Visit (INDEPENDENT_AMBULATORY_CARE_PROVIDER_SITE_OTHER): Payer: Medicare HMO | Admitting: *Deleted

## 2022-01-16 DIAGNOSIS — I5032 Chronic diastolic (congestive) heart failure: Secondary | ICD-10-CM

## 2022-01-16 DIAGNOSIS — I10 Essential (primary) hypertension: Secondary | ICD-10-CM

## 2022-01-16 NOTE — Patient Instructions (Signed)
Visit [name], thank you for taking time to talk with me today. Please don't hesitate to contact me if I can be of assistance to you before our next scheduled telephone appointment.  Below are the goals we discussed today:  Patient Self-Care Activities: Patient [name] will: Take medications as prescribed Attend all scheduled provider appointments Call pharmacy for medication refills Call provider office for new concerns or questions Continue to follow heart healthy, low salt, low cholesterol, carbohydrate-modified, low sugar diet Keep up the great work preventing falls- continue to use your cane and walker if you need to Continue monitoring and writing down on paper your blood pressures and your weights at home: we will review these each time we talk on the phone  Our next scheduled telephone follow up visit/ appointment with care management team member is scheduled on:  Friday, February 08, 2022 at 11:30 am- This is a PHONE [name] appointment  If you need to cancel or re-schedule our visit, please call [phone] and our care guide team will be happy to assist you.   I look forward to hearing about your progress.   Oneta Rack, RN, BSN, [place] [phone]: direct office  If you are experiencing a Mental Health or [place] or need someone to talk to, please  call the Suicide and Crisis Lifeline: 988 call the [place] National Suicide Prevention Lifeline: 585 323 3399 or TTY: (279)076-9140 TTY 424 879 8109) to talk to a trained counselor call 1-800-273-TALK (toll free, 24 hour hotline) go to [facility] [address] [place] [phone]) call 911   Heart Failure Action Plan A heart failure action plan helps you understand what to do when you have symptoms of heart failure. Your action plan is a color-coded plan that lists the symptoms to watch for and  indicates what actions to take. If you have symptoms in the red zone, you need medical care right away. If you have symptoms in the yellow zone, you are having problems. If you have symptoms in the green zone, you are doing well. Follow the plan that was created by you and your health care provider. Review your plan each time you visit your health care provider. Red zone These signs and symptoms mean you should get medical help right away: You have trouble breathing when resting. You have a dry cough that is getting worse. You have swelling or pain in your legs or abdomen that is getting worse. You suddenly gain more than 2-3 lb (0.9-1.4 kg) in 24 hours, or more than 5 lb (2.3 kg) in a week. This amount may be more or less depending on your condition. You have trouble staying awake or you feel confused. You have chest pain. You do not have an appetite. You pass out. You have worsening sadness or depression. If you have any of these symptoms, call your local emergency services (911 in the [place] right away. Do not drive yourself to the hospital. Yellow zone These signs and symptoms mean your condition may be getting worse and you should make some changes: You have trouble breathing when you are active, or you need to sleep with your head raised on extra pillows to help you breathe. You have swelling in your legs or abdomen. You gain 2-3 lb (0.9-1.4 kg) in 24 hours, or 5 lb (2.3 kg) in a week. This amount may be more or less depending on your condition. You get  tired easily. You have trouble sleeping. You have a dry cough. If you have any of these symptoms: Contact your health care provider within the next day. Your health care provider may adjust your medicines. Green zone These signs mean you are doing well and can continue what you are doing: You do not have shortness of breath. You have very little swelling or no new swelling. Your weight is stable (no gain or loss). You have a  normal activity level. You do not have chest pain or any other new symptoms. Follow these instructions at home: Take over-the-counter and prescription medicines only as told by your health care provider. Weigh yourself daily. Your target weight is __________ lb (__________ kg). Call your health care provider if you gain more than __________ lb (__________ kg) in 24 hours, or more than __________ lb (__________ kg) in a week. Health care provider name: _____________________________________________________ Health care provider phone number: _____________________________________________________ Eat a heart-healthy diet. Work with a diet and nutrition specialist (dietitian) to create an eating plan that is best for you. Keep all follow-up visits. This is important. Where to find more information American Heart Association: www.heart.org Summary A heart failure action plan helps you understand what to do when you have symptoms of heart failure. Follow the action plan that was created by you and your health care provider. Get help right away if you have any symptoms in the red zone. This information is not intended to replace advice given to you by your health care provider. Make sure you discuss any questions you have with your health care provider. Document Revised: 06/26/2020 Document Reviewed: 06/26/2020 Elsevier Patient Education  2022 [employer].    Following is a copy of your full care plan:  Care Plan : RN Care Manager POC  Updates made by Knox Royalty, RN since 01/16/2022 12:00 AM     Problem: Chronic Disease Mgmt of Chronic Conditions(HF, OSA)   Priority: High     Long-Range Goal: Development of POC for Mgmt of Chronic Conditions(CHF, OSA) Completed 01/16/2022  Start Date: 10/15/2021  Expected End Date: 10/15/2022  Priority: High  Note:    Current Barriers:  Chronic Disease Management support and education needs related to CHF and OSA  RNCM Clinical Goal(s):  Patient  will take all medications exactly as prescribed and will call provider for medication related questions attend all scheduled medical appointments: including PCP and specialists continue to work with RN Care Manager to address care management and care coordination needs related to  CHF and OSA  through collaboration with RN Care manager, provider, and care team.   Interventions: POC sent to PCP upon initial assessment, quarterly and with any changes in patient's conditions Inter-disciplinary care team collaboration (see longitudinal plan of care) Evaluation of current treatment plan related to  self management and patient's adherence to plan as established by provider  [facility] goal. Evaluation of current treatment plan related to  OSA ,  self-management and patient's adherence to plan as established by provider. Discussed plans with patient for ongoing care management follow up and provided patient with direct contact information for care management team Evaluation of current treatment plan related to OSA and patient's adherence to plan as established by provider; Reviewed medications with patient and discussed new current therapy med and results; patient reports she is resting better at night since starting new med(Gemtesa), pt using oxygen QHS as well Heart Failure Interventions: Provided education on low sodium diet; Reviewed wgt monitoring log Reviewed low sodium  diet and adherence measures Assessed for any swelling/edema or other sxs  Patient Goals/Self-Care Activities: Patient will self administer medications as prescribed Patient will attend all scheduled provider appointments Patient will continue to perform ADL's independently Patient will continue to monitor for HF sxs and wgt in the home  Follow Up Plan:  Telephone follow up appointment with care management team member scheduled for:  quarterly-within the month of Feb. The patient has been provided with contact information for the  care management team and has been advised to call with any health related questions or concerns.      Care Plan : CCM RN CM Plan of Care  Updates made by Knox Royalty, RN since 01/16/2022 12:00 AM     Problem: Chronic Disease Management Needs   Priority: High     Long-Range Goal: Development of plan of care for long term chronic disease management   Start Date: 01/16/2022  Expected End Date: 01/16/2023  Priority: High  Note:   Current Barriers:  Chronic Disease Management support and education needs related to CHF and HTN Recent ED visit 11/27/21- dyspnea: discharged home to self-care  RNCM Clinical Goal(s):  Patient will demonstrate ongoing health management independence as evidenced by adherence to plan of care for HTN/ CHF        through collaboration with RN Care manager, provider, and care team.   Interventions: 1:1 collaboration with primary care provider regarding development and update of comprehensive plan of care as evidenced by provider attestation and co-signature Inter-disciplinary care team collaboration (see longitudinal plan of care) Evaluation of current treatment plan related to  self management and patient's adherence to plan as established by provider 01/16/22: CCM RN CM Initial assessment completed SDOH completed/ assessed; no concerns or unmet needs identified Pain assessment completed: reports chronic "manageable" back pain; reports prescribed narcotic medication "works to ease" pain, but "nothing" really takes it away" Falls assessment completed: denies falls x 12 months, uses cane/ walker consistently; education provided around fall risks/ prevention Confirmed patient self-manages medications and endorses adherence to medication regimen; uses 7-day pill box; denies questions/ concerns around medications;  confirmed CCM pharmacy team active/ involved- encouraged her ongoing engagement with CCM pharmacy team  Heart Failure and HTN Interventions:  (Status:  01/16/22: New goal.)  Long Term Goal  Wt Readings from Last 3 Encounters:  12/13/21 203 lb (92.1 kg)  10/22/21 200 lb 3.2 oz (90.8 kg)  09/19/21 198 lb (89.8 kg)  Last practice recorded BP readings:  BP Readings from Last 3 Encounters:  12/13/21 120/72  11/27/21 (!) 179/73  10/22/21 (!) 168/98  Most recent eGFR/CrCl: No results found for: EGFR  No components found for: CRCL  Evaluation of current treatment plan related to hypertension self management and patient's adherence to plan as established by provider Provided education to patient re: stroke prevention, s/s of heart attack and stroke Discussed plans with patient for ongoing care management follow up and provided patient with direct contact information for care management team  Reviewed recent blood pressures at home: patient reports consistent values between 120-140/60-80; reports blood pressure today of "127/69"  Basic overview and discussion of pathophysiology of Heart Failure reviewed Provided education on low sodium diet Assessed need for readable accurate scales in home Provided education about placing scale on hard, flat surface Advised patient to weigh each morning after emptying bladder Discussed importance of daily weight and advised patient to weigh and record daily Reviewed role of diuretics in prevention of fluid overload and management  of heart failure Discussed the importance of keeping all appointments with provider Confirmed patient monitors but does not typically record daily weights at home: reports weight ranges consistently between 198-200 lbs, with a recalled weight "yesterday" of "199 lbs" Provided education around signs/ symptoms yellow CHF zone, along with corresponding action plan for same: patient has fair baseline understanding; will require ongoing education, reinforcement and support in self-health management of same  Discussed patient's report of "being bloated in stomach;" she states she believes this  is from eating milk in her cereal; she denies shortness of breath, increased lower extremity swelling, or weight gain > 3-5 lbs overnight; reports she has taken OTC "gas medicine" which she believes is "helping;" I encouraged her to monitor this and to contact care providers if she remains concerns, or if this worsens/ does not improve Confirmed patient uses CPAP qHS- reports "helps my breathing more than anything;" she reports she is not having to use home O2 regularly, as long as she uses CPAP Discussed role of diuretics in setting of CHF with need to balance prevention of fluid overload without becoming dehydrated: discussed signs/ symptoms dehydration and corresponding action plan for same: contact care providers promptly if she believes she is becoming dehydrated  Patient Goals/Self-Care Activities: As evidenced by review of EHR, collaboration with care team, and patient reporting during CCM RN CM outreach,  Patient [name] will: Take medications as prescribed Attend all scheduled provider appointments Call pharmacy for medication refills Call provider office for new concerns or questions Continue to follow heart healthy, low salt, low cholesterol, carbohydrate-modified, low sugar diet Keep up the great work preventing falls- continue to use your cane and walker if you need to Continue monitoring and writing down on paper your blood pressures and your weights at home: we will review these each time we talk on the phone      Consent to CCM Services: [name] was given information about Chronic Care Management services 01/18/20 including:  CCM service includes personalized support from designated clinical staff supervised by her physician, including individualized plan of care and coordination with other care providers 24/7 contact phone numbers for assistance for urgent and routine care needs. Service will only be billed when office clinical staff spend 20 minutes or more in a month to  coordinate care. Only one practitioner may furnish and bill the service in a calendar month. The patient may stop CCM services at any time (effective at the end of the month) by phone call to the office staff. The patient will be responsible for cost sharing (co-pay) of up to 20% of the service fee (after annual deductible is met).  Patient agreed to services and verbal consent obtained.   Patient verbalizes understanding of instructions and care plan provided today and agrees to view in [place]. Active MyChart status confirmed with patient Telephone follow up appointment with care management team member scheduled for:  Friday, February 08, 2022 at 11:30 am The patient has been provided with contact information for the care management team and has been advised to call with any health related questions or concerns

## 2022-01-16 NOTE — Chronic Care Management (AMB) (Signed)
Chronic Care Management   CCM RN Visit Note  01/16/2022 Name: [name] MRN: [id number] DOB: 09/18/1933  Subjective: [name] is a 88 y.o. year old female who is a primary care patient of [name], MD. The care management team was consulted for assistance with disease management and care coordination needs.    Engaged with patient by telephone for initial visit in response to provider referral for case management and/or care coordination services.   Consent to Services:  The patient was given information about Chronic Care Management services, agreed to services, and gave verbal consent 01/18/20 by [facility] team prior to initiation of services.  Please see initial visit note for detailed documentation.  Patient agreed to services and verbal consent obtained.   Assessment: Review of patient past medical history, allergies, medications, health status, including review of consultants reports, laboratory and other test data, was performed as part of comprehensive evaluation and provision of chronic care management services.   SDOH (Social Determinants of Health) assessments and interventions performed:  SDOH Interventions    Flowsheet Row Most Recent Value  SDOH Interventions   Food Insecurity Interventions Intervention Not Indicated  [Denies food insecurity,  does not receive assistance with food]  Housing Interventions Intervention Not Indicated  [Lives alone in single family one level home x 52 years,  family lives on same street,  feels safe at home]  Transportation Interventions Intervention Not Indicated  [Patient drives self short distances,  family assists as indicated]     CCM Care Plan  Allergies  Allergen Reactions   Amlodipine Other (See Comments)    Reaction:  Dizziness    Enalapril Cough   Losartan Potassium Other (See Comments)    Reaction:  GI pain    Metformin Diarrhea   Morphine And Related Itching and Nausea And Vomiting   Outpatient Encounter  Medications as of 01/16/2022  Medication Sig   albuterol (PROVENTIL) (2.5 MG/3ML) 0.083% nebulizer solution INHALE THE CONTENTS OF 1 VIAL VIA NEBULIZER TWICE DAILY AS NEEDED FOR WHEEZING OR SHORTNESS OF BREATH   carvedilol (COREG) 12.5 MG tablet Take 12.5 mg by mouth 2 (two) times daily with a meal.   Cholecalciferol 2000 units TABS Take 2 tablets (4,000 Units total) by mouth daily.   clonazePAM (KLONOPIN) 1 MG tablet TAKE 1 TABLET(1 MG) BY MOUTH TWICE DAILY AS NEEDED FOR ANXIETY   hydrALAZINE (APRESOLINE) 50 MG tablet TAKE 1 TABLET(50 MG) BY MOUTH TWICE DAILY   HYDROcodone-acetaminophen (NORCO) 10-325 MG tablet Take 1 tablet by mouth every 6 (six) hours as needed.   MYRBETRIQ 50 MG TB24 tablet TAKE 1 TABLET(50 MG) BY MOUTH DAILY   omeprazole (PRILOSEC) 40 MG capsule TAKE 1 CAPSULE(40 MG) BY MOUTH DAILY   promethazine (PHENERGAN) 12.5 MG tablet TAKE 1 TABLET(12.5 MG) BY MOUTH EVERY 8 HOURS AS NEEDED FOR NAUSEA OR VOMITING   spironolactone (ALDACTONE) 25 MG tablet Take 0.5 tablets (12.5 mg total) by mouth daily.   tiZANidine (ZANAFLEX) 4 MG tablet Take 1 tablet (4 mg total) by mouth every 8 (eight) hours as needed for muscle spasms.   torsemide (DEMADEX) 20 MG tablet TAKE 1 TABLET BY MOUTH EVERY DAY   traZODone (DESYREL) 100 MG tablet TAKE 1 TABLET(100 MG) BY MOUTH AT BEDTIME   trolamine salicylate (ASPERCREME) 10 % cream Apply 1 application topically as needed for muscle pain.   Vibegron (GEMTESA) 75 MG TABS Take 75 mg by mouth daily.   No facility-administered encounter medications on file as of  01/16/2022.   Patient Active Problem List   Diagnosis Date Noted   Chronic hyperglycemia 09/19/2021   Chronic pain 04/28/2021   Encounter for palliative care involving management of pain 09/20/2020   Excessive somnolence disorder 04/12/2020   Routine general medical examination at a health care facility 04/10/2020   Anxiety 08/26/2018   Seizure ([place]) 08/25/2018   GERD (gastroesophageal reflux  disease) 08/25/2018   Chronic diastolic CHF (congestive heart failure) ([place]) 08/25/2018   Major depressive disorder, recurrent episode with anxious distress ([place]) 02/04/2017   Dementia arising in the senium and presenium ([place]) 06/27/2016   Obesity, Class II, BMI 35-39.9, with comorbidity 05/15/2016   Insomnia w/ sleep apnea 10/16/2015   IBS (irritable bowel syndrome) 02/27/2015   Hashimoto's thyroiditis 05/05/2014   Adrenal incidentaloma ([place]) 11/04/2013   Benign paroxysmal positional vertigo 07/15/2013   Aortic stenosis, mild 07/14/2013   OAB (overactive bladder) 02/24/2013   GERD with stricture 12/24/2012   Sleep-related hypoventilation 07/30/2012   OSA (obstructive sleep apnea) 04/16/2012   Osteopenia 04/16/2012   Vitamin D deficiency 12/04/2010   Hyperlipidemia with target LDL less than 130 12/03/2010   GAD (generalized anxiety disorder) 12/03/2010   Essential hypertension 12/03/2010   Osteoarthritis 12/03/2010   Conditions to be addressed/monitored:  CHF and HTN  Care Plan : RN Care Manager POC  Updates made by Knox Royalty, RN since 01/16/2022 12:00 AM     Problem: Chronic Disease Mgmt of Chronic Conditions(HF, OSA)   Priority: High     Long-Range Goal: Development of POC for Mgmt of Chronic Conditions(CHF, OSA) Completed 01/16/2022  Start Date: 10/15/2021  Expected End Date: 10/15/2022  Priority: High  Note:    Current Barriers:  Chronic Disease Management support and education needs related to CHF and OSA  RNCM Clinical Goal(s):  Patient will take all medications exactly as prescribed and will call provider for medication related questions attend all scheduled medical appointments: including PCP and specialists continue to work with RN Care Manager to address care management and care coordination needs related to  CHF and OSA  through collaboration with RN Care manager, provider, and care team.   Interventions: POC sent to PCP upon initial assessment, quarterly  and with any changes in patient's conditions Inter-disciplinary care team collaboration (see longitudinal plan of care) Evaluation of current treatment plan related to  self management and patient's adherence to plan as established by provider  [facility] goal. Evaluation of current treatment plan related to  OSA ,  self-management and patient's adherence to plan as established by provider. Discussed plans with patient for ongoing care management follow up and provided patient with direct contact information for care management team Evaluation of current treatment plan related to OSA and patient's adherence to plan as established by provider; Reviewed medications with patient and discussed new current therapy med and results; patient reports she is resting better at night since starting new med(Gemtesa), pt using oxygen QHS as well Heart Failure Interventions: Provided education on low sodium diet; Reviewed wgt monitoring log Reviewed low sodium diet and adherence measures Assessed for any swelling/edema or other sxs  Patient Goals/Self-Care Activities: Patient will self administer medications as prescribed Patient will attend all scheduled provider appointments Patient will continue to perform ADL's independently Patient will continue to monitor for HF sxs and wgt in the home  Follow Up Plan:  Telephone follow up appointment with care management team member scheduled for:  quarterly-within the month of Feb. The patient has been provided with contact information  for the care management team and has been advised to call with any health related questions or concerns.      Care Plan : CCM RN CM Plan of Care  Updates made by Knox Royalty, RN since 01/16/2022 12:00 AM     Problem: Chronic Disease Management Needs   Priority: High     Long-Range Goal: Development of plan of care for long term chronic disease management   Start Date: 01/16/2022  Expected End Date: 01/16/2023  Priority: High   Note:   Current Barriers:  Chronic Disease Management support and education needs related to CHF and HTN Recent ED visit 11/27/21- dyspnea: discharged home to self-care  RNCM Clinical Goal(s):  Patient will demonstrate ongoing health management independence as evidenced by adherence to plan of care for HTN/ CHF        through collaboration with RN Care manager, provider, and care team.   Interventions: 1:1 collaboration with primary care provider regarding development and update of comprehensive plan of care as evidenced by provider attestation and co-signature Inter-disciplinary care team collaboration (see longitudinal plan of care) Evaluation of current treatment plan related to  self management and patient's adherence to plan as established by provider 01/16/22: CCM RN CM Initial assessment completed SDOH completed/ assessed; no concerns or unmet needs identified Depression screening completed: patient denies chronic/ ongoing depression today; states "I might get sad every now and again, but I try to keep myself busy; I am not hopeless at all" Pain assessment completed: reports chronic "manageable" back pain; reports prescribed narcotic medication "works to ease" pain, but "nothing" really takes it away" Falls assessment completed: denies falls x 12 months, uses cane/ walker consistently; education provided around fall risks/ prevention Confirmed patient self-manages medications and endorses adherence to medication regimen; uses 7-day pill box; denies questions/ concerns around medications;  confirmed CCM pharmacy team active/ involved- encouraged her ongoing engagement with CCM pharmacy team  Heart Failure and HTN Interventions:  (Status: 01/16/22: New goal.)  Long Term Goal  Wt Readings from Last 3 Encounters:  12/13/21 203 lb (92.1 kg)  10/22/21 200 lb 3.2 oz (90.8 kg)  09/19/21 198 lb (89.8 kg)  Last practice recorded BP readings:  BP Readings from Last 3 Encounters:  12/13/21  120/72  11/27/21 (!) 179/73  10/22/21 (!) 168/98  Most recent eGFR/CrCl: No results found for: EGFR  No components found for: CRCL  Evaluation of current treatment plan related to hypertension self management and patient's adherence to plan as established by provider Provided education to patient re: stroke prevention, s/s of heart attack and stroke Discussed plans with patient for ongoing care management follow up and provided patient with direct contact information for care management team  Reviewed recent blood pressures at home: patient reports consistent values between 120-140/60-80; reports blood pressure today of "127/69"  Basic overview and discussion of pathophysiology of Heart Failure reviewed Provided education on low sodium diet Assessed need for readable accurate scales in home Provided education about placing scale on hard, flat surface Advised patient to weigh each morning after emptying bladder Discussed importance of daily weight and advised patient to weigh and record daily Reviewed role of diuretics in prevention of fluid overload and management of heart failure Discussed the importance of keeping all appointments with provider Confirmed patient monitors but does not typically record daily weights at home: reports weight ranges consistently between 198-200 lbs, with a recalled weight "yesterday" of "199 lbs" Provided education around signs/ symptoms yellow CHF zone, along  with corresponding action plan for same: patient has fair baseline understanding; will require ongoing education, reinforcement and support in self-health management of same  Discussed patient's report of "being bloated in stomach;" she states she believes this is from eating milk in her cereal; she denies shortness of breath, increased lower extremity swelling, or weight gain > 3-5 lbs overnight; reports she has taken OTC "gas medicine" which she believes is "helping;" I encouraged her to monitor this and to  contact care providers if she remains concerns, or if this worsens/ does not improve Confirmed patient uses CPAP qHS- reports "helps my breathing more than anything;" she reports she is not having to use home O2 regularly, as long as she uses CPAP Discussed role of diuretics in setting of CHF with need to balance prevention of fluid overload without becoming dehydrated: discussed signs/ symptoms dehydration and corresponding action plan for same: contact care providers promptly if she believes she is becoming dehydrated  Patient Goals/Self-Care Activities: As evidenced by review of EHR, collaboration with care team, and patient reporting during CCM RN CM outreach,  Patient [place] will: Take medications as prescribed Attend all scheduled provider appointments Call pharmacy for medication refills Call provider office for new concerns or questions Continue to follow heart healthy, low salt, low cholesterol, carbohydrate-modified, low sugar diet Keep up the great work preventing falls- continue to use your cane and walker if you need to Continue monitoring and writing down on paper your blood pressures and your weights at home: we will review these each time we talk on the phone      Plan: Telephone follow up appointment with care management team member scheduled for:  Friday, February 08, 2022 at 11:30 am The patient has been provided with contact information for the care management team and has been advised to call with any health related questions or concerns  Oneta Rack, RN, BSN, [place] [phone]: direct office

## 2022-01-21 ENCOUNTER — Other Ambulatory Visit: Payer: Self-pay | Admitting: Internal Medicine

## 2022-01-21 ENCOUNTER — Telehealth: Payer: Self-pay | Admitting: Internal Medicine

## 2022-01-21 DIAGNOSIS — G894 Chronic pain syndrome: Secondary | ICD-10-CM

## 2022-01-21 DIAGNOSIS — Z515 Encounter for palliative care: Secondary | ICD-10-CM

## 2022-01-21 DIAGNOSIS — M159 Polyosteoarthritis, unspecified: Secondary | ICD-10-CM

## 2022-01-21 MED ORDER — HYDROCODONE-ACETAMINOPHEN 10-325 MG PO TABS: 1.0000 | ORAL_TABLET | Freq: Four times a day (QID) | ORAL | 0 refills | Status: DC | PRN

## 2022-01-21 NOTE — Telephone Encounter (Signed)
1.Medication Requested: HYDROcodone-acetaminophen (NORCO) 10-325 MG tablet  2. Pharmacy (Name, Street, [place]):[facility] [phone] - [place], [place] - [address] AT [address]  3. On Med List: yes   4. Last Visit with PCP: 09-19-2021  5. Next visit date with PCP: 03-20-2022   Agent: Please be advised that RX refills may take up to 3 business days. We ask that you follow-up with your pharmacy.

## 2022-01-22 DIAGNOSIS — I10 Essential (primary) hypertension: Secondary | ICD-10-CM | POA: Diagnosis not present

## 2022-01-22 DIAGNOSIS — I5032 Chronic diastolic (congestive) heart failure: Secondary | ICD-10-CM | POA: Diagnosis not present

## 2022-01-25 ENCOUNTER — Other Ambulatory Visit: Payer: Self-pay | Admitting: Internal Medicine

## 2022-01-25 DIAGNOSIS — G4700 Insomnia, unspecified: Secondary | ICD-10-CM

## 2022-01-25 DIAGNOSIS — I5032 Chronic diastolic (congestive) heart failure: Secondary | ICD-10-CM

## 2022-01-25 DIAGNOSIS — I10 Essential (primary) hypertension: Secondary | ICD-10-CM

## 2022-01-25 DIAGNOSIS — G4730 Sleep apnea, unspecified: Secondary | ICD-10-CM

## 2022-02-01 ENCOUNTER — Other Ambulatory Visit: Payer: Self-pay

## 2022-02-01 ENCOUNTER — Ambulatory Visit (INDEPENDENT_AMBULATORY_CARE_PROVIDER_SITE_OTHER): Payer: Medicare HMO

## 2022-02-01 DIAGNOSIS — Z0000 Encounter for general adult medical examination without abnormal findings: Secondary | ICD-10-CM

## 2022-02-01 NOTE — Patient Instructions (Signed)
[name] , Thank you for taking time to come for your Medicare Wellness Visit. I appreciate your ongoing commitment to your health goals. Please review the following plan we discussed and let me know if I can assist you in the future.   Screening recommendations/referrals: Colonoscopy: no longer required  Mammogram: no longer required  Bone Density: 05/11/2013 declined  Recommended yearly ophthalmology/optometry visit for glaucoma screening and checkup Recommended yearly dental visit for hygiene and checkup  Vaccinations: Influenza vaccine: completed  Pneumococcal vaccine: completed  Tdap vaccine: 04/10/2020 Shingles vaccine: will consider     Advanced directives: yes   Conditions/risks identified: none   Next appointment: none    Preventive Care 34 Years and Older, Female Preventive care refers to lifestyle choices and visits with your health care provider that can promote health and wellness. What does preventive care include? A yearly physical exam. This is also called an annual well check. Dental exams once or twice a year. Routine eye exams. Ask your health care provider how often you should have your eyes checked. Personal lifestyle choices, including: Daily care of your teeth and gums. Regular physical activity. Eating a healthy diet. Avoiding tobacco and drug use. Limiting alcohol use. Practicing safe sex. Taking low-dose aspirin every day. Taking vitamin and mineral supplements as recommended by your health care provider. What happens during an annual well check? The services and screenings done by your health care provider during your annual well check will depend on your age, overall health, lifestyle risk factors, and family history of disease. Counseling  Your health care provider may ask you questions about your: Alcohol use. Tobacco use. Drug use. Emotional well-being. Home and relationship well-being. Sexual activity. Eating habits. History of  falls. Memory and ability to understand (cognition). Work and work Statistician. Reproductive health. Screening  You may have the following tests or measurements: Height, weight, and BMI. Blood pressure. Lipid and cholesterol levels. These may be checked every 5 years, or more frequently if you are over 12 years old. Skin check. Lung cancer screening. You may have this screening every year starting at age 50 if you have a 30-pack-year history of smoking and currently smoke or have quit within the past 15 years. Fecal occult blood test (FOBT) of the stool. You may have this test every year starting at age 43. Flexible sigmoidoscopy or colonoscopy. You may have a sigmoidoscopy every 5 years or a colonoscopy every 10 years starting at age 53. Hepatitis C blood test. Hepatitis B blood test. Sexually transmitted disease (STD) testing. Diabetes screening. This is done by checking your blood sugar (glucose) after you have not eaten for a while (fasting). You may have this done every 1-3 years. Bone density scan. This is done to screen for osteoporosis. You may have this done starting at age 53. Mammogram. This may be done every 1-2 years. Talk to your health care provider about how often you should have regular mammograms. Talk with your health care provider about your test results, treatment options, and if necessary, the need for more tests. Vaccines  Your health care provider may recommend certain vaccines, such as: Influenza vaccine. This is recommended every year. Tetanus, diphtheria, and acellular pertussis (Tdap, Td) vaccine. You may need a Td booster every 10 years. Zoster vaccine. You may need this after age 65. Pneumococcal 13-valent conjugate (PCV13) vaccine. One dose is recommended after age 65. Pneumococcal polysaccharide (PPSV23) vaccine. One dose is recommended after age 38. Talk to your health care provider about which  screenings and vaccines you need and how often you need  them. This information is not intended to replace advice given to you by your health care provider. Make sure you discuss any questions you have with your health care provider. Document Released: 12/08/2015 Document Revised: 07/31/2016 Document Reviewed: 09/12/2015 Elsevier Interactive Patient Education  2017 [place] Prevention in the Home Falls can cause injuries. They can happen to people of all ages. There are many things you can do to make your home safe and to help prevent falls. What can I do on the outside of my home? Regularly fix the edges of walkways and driveways and fix any cracks. Remove anything that might make you trip as you walk through a door, such as a raised step or threshold. Trim any bushes or trees on the path to your home. Use bright outdoor lighting. Clear any walking paths of anything that might make someone trip, such as rocks or tools. Regularly check to see if handrails are loose or broken. Make sure that both sides of any steps have handrails. Any raised decks and porches should have guardrails on the edges. Have any leaves, snow, or ice cleared regularly. Use sand or salt on walking paths during winter. Clean up any spills in your garage right away. This includes oil or grease spills. What can I do in the bathroom? Use night lights. Install grab bars by the toilet and in the tub and shower. Do not use towel bars as grab bars. Use non-skid mats or decals in the tub or shower. If you need to sit down in the shower, use a plastic, non-slip stool. Keep the floor dry. Clean up any water that spills on the floor as soon as it happens. Remove soap buildup in the tub or shower regularly. Attach bath mats securely with double-sided non-slip rug tape. Do not have throw rugs and other things on the floor that can make you trip. What can I do in the bedroom? Use night lights. Make sure that you have a light by your bed that is easy to reach. Do not use  any sheets or blankets that are too big for your bed. They should not hang down onto the floor. Have a firm chair that has side arms. You can use this for support while you get dressed. Do not have throw rugs and other things on the floor that can make you trip. What can I do in the kitchen? Clean up any spills right away. Avoid walking on wet floors. Keep items that you use a lot in easy-to-reach places. If you need to reach something above you, use a strong step stool that has a grab bar. Keep electrical cords out of the way. Do not use floor polish or wax that makes floors slippery. If you must use wax, use non-skid floor wax. Do not have throw rugs and other things on the floor that can make you trip. What can I do with my stairs? Do not leave any items on the stairs. Make sure that there are handrails on both sides of the stairs and use them. Fix handrails that are broken or loose. Make sure that handrails are as long as the stairways. Check any carpeting to make sure that it is firmly attached to the stairs. Fix any carpet that is loose or worn. Avoid having throw rugs at the top or bottom of the stairs. If you do have throw rugs, attach them to the floor with carpet  tape. Make sure that you have a light switch at the top of the stairs and the bottom of the stairs. If you do not have them, ask someone to add them for you. What else can I do to help prevent falls? Wear shoes that: Do not have high heels. Have rubber bottoms. Are comfortable and fit you well. Are closed at the toe. Do not wear sandals. If you use a stepladder: Make sure that it is fully opened. Do not climb a closed stepladder. Make sure that both sides of the stepladder are locked into place. Ask someone to hold it for you, if possible. Clearly mark and make sure that you can see: Any grab bars or handrails. First and last steps. Where the edge of each step is. Use tools that help you move around (mobility aids)  if they are needed. These include: Canes. Walkers. Scooters. Crutches. Turn on the lights when you go into a dark area. Replace any light bulbs as soon as they burn out. Set up your furniture so you have a clear path. Avoid moving your furniture around. If any of your floors are uneven, fix them. If there are any pets around you, be aware of where they are. Review your medicines with your doctor. Some medicines can make you feel dizzy. This can increase your chance of falling. Ask your doctor what other things that you can do to help prevent falls. This information is not intended to replace advice given to you by your health care provider. Make sure you discuss any questions you have with your health care provider. Document Released: 09/07/2009 Document Revised: 04/18/2016 Document Reviewed: 12/16/2014 Elsevier Interactive Patient Education  2017 [employer].

## 2022-02-01 NOTE — Progress Notes (Signed)
Subjective:   [name] is a 88 y.o. female who presents for Medicare Annual (Subsequent) preventive examination.  I connected with [name] today by telephone and verified that I am speaking with the correct person using two identifiers. Location patient: home Location provider: work Persons participating in the virtual visit: patient, provider.   I discussed the limitations, risks, security and privacy concerns of performing an evaluation and management service by telephone and the availability of in person appointments. I also discussed with the patient that there may be a patient responsible charge related to this service. The patient expressed understanding and verbally consented to this telephonic visit.    Interactive audio and video telecommunications were attempted between this provider and patient, however failed, due to patient having technical difficulties OR patient did not have access to video capability.  We continued and completed visit with audio only.    Review of Systems     Cardiac Risk Factors include: advanced age (>67mn, >>63women);hypertension     Objective:    Today's Vitals   There is no height or weight on file to calculate BMI.  Advanced Directives 02/01/2022 04/28/2021 03/28/2021 01/23/2021 09/21/2020 04/25/2020 09/06/2019  Does Patient Have a Medical Advance Directive? Yes No Yes Yes Yes No Yes  Type of AParamedicof [place] will - [place] will [place]Living will [place] will - [place] will  Does patient want to make changes to medical advance directive? - - No - Patient declined No - Patient declined - - No - Patient declined  Copy of [place] Chart? No - copy requested - No - copy requested No - copy requested - - No - copy requested  Would patient like information on creating a medical advance  directive? - No - Patient declined - - - No - Patient declined -  Pre-existing out of facility DNR order (yellow form or pink MOST form) - - - - - - -    Current Medications (verified) Outpatient Encounter Medications as of 02/01/2022  Medication Sig   albuterol (PROVENTIL) (2.5 MG/3ML) 0.083% nebulizer solution INHALE THE CONTENTS OF 1 VIAL VIA NEBULIZER TWICE DAILY AS NEEDED FOR WHEEZING OR SHORTNESS OF BREATH   carvedilol (COREG) 12.5 MG tablet Take 12.5 mg by mouth 2 (two) times daily with a meal.   Cholecalciferol 2000 units TABS Take 2 tablets (4,000 Units total) by mouth daily.   clonazePAM (KLONOPIN) 1 MG tablet TAKE 1 TABLET(1 MG) BY MOUTH TWICE DAILY AS NEEDED FOR ANXIETY   hydrALAZINE (APRESOLINE) 50 MG tablet TAKE 1 TABLET(50 MG) BY MOUTH TWICE DAILY   HYDROcodone-acetaminophen (NORCO) 10-325 MG tablet Take 1 tablet by mouth every 6 (six) hours as needed.   omeprazole (PRILOSEC) 40 MG capsule TAKE 1 CAPSULE(40 MG) BY MOUTH DAILY   promethazine (PHENERGAN) 12.5 MG tablet TAKE 1 TABLET(12.5 MG) BY MOUTH EVERY 8 HOURS AS NEEDED FOR NAUSEA OR VOMITING   spironolactone (ALDACTONE) 25 MG tablet Take 0.5 tablets (12.5 mg total) by mouth daily.   tiZANidine (ZANAFLEX) 4 MG tablet Take 1 tablet (4 mg total) by mouth every 8 (eight) hours as needed for muscle spasms.   torsemide (DEMADEX) 20 MG tablet TAKE 1 TABLET BY MOUTH EVERY DAY   traZODone (DESYREL) 100 MG tablet TAKE 1 TABLET(100 MG) BY MOUTH AT BEDTIME   trolamine salicylate (ASPERCREME) 10 % cream Apply 1 application topically as needed for  muscle pain.   Vibegron (GEMTESA) 75 MG TABS Take 75 mg by mouth daily.   MYRBETRIQ 50 MG TB24 tablet TAKE 1 TABLET(50 MG) BY MOUTH DAILY (Patient not taking: Reported on 02/01/2022)   No facility-administered encounter medications on file as of 02/01/2022.    Allergies (verified) Amlodipine, Enalapril, Losartan potassium, Metformin, and Morphine and related   History: Past Medical History:   Diagnosis Date   Abdominal pain    Anemia    NOS   Anxiety    Back pain    Bruises easily    Chronic diastolic CHF (congestive heart failure) ([place])    a. 06/2013 EF 65-70%.   Chronic headaches    HISTORY OF    Depression    Diabetes mellitus    type II  DIET CONTROLLED   Diarrhea    Hepatitis    AGE 30S   Hyperlipidemia    Hypertension    Mild aortic stenosis    a. 06/2013 Echo: EF 65-70%, mod LVH with focal basal hypertrophy, very mild AS.   Osteoarthritis    Oxygen desaturation during sleep    USES 2 LITERS BEDTIME   VIA CPAP 06/2012 [facility]    Shortness of breath    WITH EXERTION USES 2 L O2 BEDTIME   Sleep apnea    CPAP WITH O2 2 LITERS 2013 ([facility])   Wears dentures    Wears glasses    Past Surgical History:  Procedure Laterality Date   ABDOMINAL HYSTERECTOMY     APPENDECTOMY     CHOLECYSTECTOMY     JOINT REPLACEMENT     LEFT KNEE    KNEE ARTHROPLASTY Right 03/22/2015   Procedure: COMPUTER ASSISTED TOTAL KNEE ARTHROPLASTY;  Surgeon: Marybelle Killings, MD;  Location: [place];  Service: Orthopedics;  Laterality: Right;   KNEE ARTHROSCOPY     RIGHT   LEFT AND RIGHT HEART CATHETERIZATION WITH CORONARY ANGIOGRAM N/A 10/28/2013   Procedure: LEFT AND RIGHT HEART CATHETERIZATION WITH CORONARY ANGIOGRAM;  Surgeon: Blane Ohara, MD;  [facility] C[facility];  Service: Cardiovascular;  Laterality: N/A;   LEFT HEART CATHETERIZATION WITH CORONARY ANGIOGRAM N/A 11/14/2014   Procedure: LEFT HEART CATHETERIZATION WITH CORONARY ANGIOGRAM;  Surgeon: Josue Hector, MD;  Location: [facility] CATH LAB;  Service: Cardiovascular;  Laterality: N/A;   REVISION TOTAL KNEE ARTHROPLASTY  2011   ROTATOR CUFF REPAIR  2010   SHOULDER ARTHROSCOPY  09/17/2012   Procedure: ARTHROSCOPY SHOULDER;  Surgeon: Sharmon Revere, MD;  Location: [place];  Service: Orthopedics;  Laterality: Left;   TONSILLECTOMY     TONSILLECTOMY     Family History  Problem Relation Age of Onset   Cancer Father        prostate  cancer   Heart disease Sister    Heart disease Brother    Alcohol abuse Other    Hypertension Other    Kidney disease Other    Mental illness Other    Emphysema Brother    Social History   Socioeconomic History   Marital status: Widowed    Spouse name: Not on file   Number of children: 5   Years of education: Not on file   Highest education level: Not on file  Occupational History   Occupation: retired     Fish farm manager: RETIRED    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 Use   Smoking status: Never   Smokeless tobacco: Never  Vaping Use   Vaping Use: Never used  Substance  and Sexual Activity   Alcohol use: No   Drug use: No   Sexual activity: Never  Other Topics Concern   Not on file  Social History Narrative   Not on file   Social Determinants of Health   Financial Resource Strain: Low Risk    Difficulty of Paying Living Expenses: Not hard at all  Food Insecurity: No Food Insecurity   Worried About Charity fundraiser in the Last Year: Never true   [place] in the Last Year: Never true  Transportation Needs: No Transportation Needs   Lack of Transportation (Medical): No   Lack of Transportation (Non-Medical): No  Physical Activity: Inactive   Days of Exercise per Week: 0 days   Minutes of Exercise per Session: 0 min  Stress: No Stress Concern Present   Feeling of Stress : Not at all  Social Connections: Socially Isolated   Frequency of Communication with Friends and Family: Three times a week   Frequency of Social Gatherings with Friends and Family: Three times a week   Attends Religious Services: Never   Active Member of Clubs or Organizations: No   Attends Archivist Meetings: Never   Marital Status: Widowed    Tobacco Counseling Counseling given: Not Answered   Clinical Intake:  Pre-visit preparation completed: Yes  Pain : No/denies pain     Nutritional Risks: None Diabetes: No  How often do you need to have someone help you when  you read instructions, pamphlets, or other written materials from your doctor or pharmacy?: 1 - Never What is the last grade level you completed in school?: college  Diabetic?no   Interpreter Needed?: No  Information entered by :: l.Bora Bost,LPN   Activities of Daily Living In your present state of health, do you have any difficulty performing the following activities: 02/01/2022 04/29/2021  Hearing? N N  Comment - -  Vision? N N  Difficulty concentrating or making decisions? N N  Walking or climbing stairs? N Y  Comment - -  Dressing or bathing? N N  Doing errands, shopping? N [place] and eating ? N -  Using the Toilet? N -  In the past six months, have you accidently leaked urine? N -  Do you have problems with loss of bowel control? N -  Managing your Medications? N -  Managing your Finances? N -  Housekeeping or managing your Housekeeping? N -  Some recent data might be hidden    Patient Care Team: [name], MD as PCP - General [name], MD as PCP - Cardiology (Cardiology) [name], MD as Consulting Physician (Cardiology) Charlton Haws, [facility] as Pharmacist (Pharmacist) Hortencia Pilar, MD as Consulting Physician (Ophthalmology) Knox Royalty, RN as Case Manager  Indicate any recent Medical Services you may have received from other than [facility] providers in the past year (date may be approximate).     Assessment:   This is a routine wellness examination for [name].  Hearing/Vision screen No results found.  Dietary issues and exercise activities discussed: Current Exercise Habits: The patient does not participate in regular exercise at present, Exercise limited by: orthopedic condition(s)   Goals Addressed   None    Depression Screen PHQ 2/9 Scores 02/01/2022 02/01/2022 01/16/2022 05/09/2021 03/28/2021 03/20/2021 01/23/2021  PHQ - 2 Score 0 0 0 3 0 0 0  PHQ- 9 Score - - - 11 - - -  Exception Documentation -  - - - - - -  Not completed - - - - - - -    Fall Risk Fall Risk  02/01/2022 10/15/2021 08/08/2021 05/08/2021 03/28/2021  Falls in the past year? 0 '1 1 1 1  '$ Number falls in past yr: 0 0 0 0 0  Injury with Fall? 0 1 1 0 1  Comment - - - - -  Risk Factor Category  - - - - -  Risk for fall due to : - History of fall(s);Medication side effect History of fall(s);Medication side effect No Fall Risks Impaired balance/gait;Medication side effect  Follow up Falls evaluation completed Falls evaluation completed Falls evaluation completed;Education provided Falls evaluation completed Falls evaluation completed;Education provided    FALL RISK PREVENTION PERTAINING TO THE HOME:  Any stairs in or around the home? No  If so, are there any without handrails? No  Home free of loose throw rugs in walkways, pet beds, electrical cords, etc? Yes  Adequate lighting in your home to reduce risk of falls? Yes   ASSISTIVE DEVICES UTILIZED TO PREVENT FALLS:  Life alert? No  Use of a cane, walker or w/c? Yes  Grab bars in the bathroom? Yes  Shower chair or bench in shower? Yes  Elevated toilet seat or a handicapped toilet? Yes    Cognitive Function:  Normal cognitive status assessed by direct observation by this Nurse Health Advisor. No abnormalities found.     6CIT Screen 08/19/2019  What Year? 0 points  What month? 0 points  What time? 0 points  Count back from 20 2 points  Months in reverse 4 points  Repeat phrase 2 points  Total Score 8    Immunizations Immunization History  Administered Date(s) Administered   Fluad Quad(high Dose 65+) 08/19/2019   Influenza Split 08/20/2012   Influenza, High Dose Seasonal PF 08/02/2015, 09/10/2016, 09/17/2017, 09/15/2018   Influenza,inj,Quad PF,6+ Mos 10/28/2013, 11/28/2014   Influenza-Unspecified 08/16/2020   PFIZER Comirnaty(Gray Top)Covid-19 Tri-Sucrose Vaccine 01/02/2021   PFIZER(Purple Top)SARS-COV-2 Vaccination 12/30/2019, 01/20/2020   Pneumococcal  Conjugate-13 05/25/2014   Pneumococcal Polysaccharide-23 04/16/2012, 09/15/2018   Td 04/25/2010   Tdap 04/10/2020   Zoster, Live 11/28/2014    TDAP status: Up to date  Flu Vaccine status: Up to date  Pneumococcal vaccine status: Up to date  Covid-19 vaccine status: Completed vaccines  Qualifies for Shingles Vaccine? Yes   Zostavax completed No   Shingrix Completed?: No.    Education has been provided regarding the importance of this vaccine. Patient has been advised to call insurance company to determine out of pocket expense if they have not yet received this vaccine. Advised may also receive vaccine at local pharmacy or Health Dept. Verbalized acceptance and understanding.  Screening Tests Health Maintenance  Topic Date Due   Zoster Vaccines- Shingrix (1 of 2) Never done   COVID-19 Vaccine (4 - Booster for Pfizer series) 02/27/2021   INFLUENZA VACCINE  02/22/2022 (Originally 06/25/2021)   TETANUS/TDAP  04/10/2030   Pneumonia Vaccine 30+ Years old  Completed   DEXA SCAN  Completed   HPV VACCINES  Aged Out    Health Maintenance  Health Maintenance Due  Topic Date Due   Zoster Vaccines- Shingrix (1 of 2) Never done   COVID-19 Vaccine (4 - Booster for Pfizer series) 02/27/2021    Colorectal cancer screening: No longer required.   Mammogram status: No longer required due to age.  Bone Density status: Completed 05/11/2013. Results reflect: Bone density results: OSTEOPENIA. Repeat every 5 years.  Lung Cancer Screening: (Low Dose CT Chest  recommended if Age 81-80 years, 10 pack-year currently smoking OR have quit w/in 15years.) does not qualify.   Lung Cancer Screening Referral: n/a  Additional Screening:  Hepatitis C Screening: does not qualify;  Vision Screening: Recommended annual ophthalmology exams for early detection of glaucoma and other disorders of the eye. Is the patient up to date with their annual eye exam?  Yes  Who is the provider or what is the name of  the office in which the patient attends annual eye exams? Dr.Hecker  If pt is not established with a provider, would they like to be referred to a provider to establish care? No .   Dental Screening: Recommended annual dental exams for proper oral hygiene  Community Resource Referral / Chronic Care Management: CRR required this visit?  No   CCM required this visit?  No      Plan:     I have personally reviewed and noted the following in the patients chart:   Medical and social history Use of alcohol, tobacco or illicit drugs  Current medications and supplements including opioid prescriptions.  Functional ability and status Nutritional status Physical activity Advanced directives List of other physicians Hospitalizations, surgeries, and ER visits in previous 12 months Vitals Screenings to include cognitive, depression, and falls Referrals and appointments  In addition, I have reviewed and discussed with patient certain preventive protocols, quality metrics, and best practice recommendations. A written personalized care plan for preventive services as well as general preventive health recommendations were provided to patient.     Randel Pigg, LPN   [id number]   Nurse Notes: none

## 2022-02-04 ENCOUNTER — Other Ambulatory Visit: Payer: Self-pay

## 2022-02-04 ENCOUNTER — Encounter (HOSPITAL_BASED_OUTPATIENT_CLINIC_OR_DEPARTMENT_OTHER): Payer: Self-pay | Admitting: Cardiology

## 2022-02-04 ENCOUNTER — Ambulatory Visit (HOSPITAL_BASED_OUTPATIENT_CLINIC_OR_DEPARTMENT_OTHER): Payer: Medicare HMO | Admitting: Cardiology

## 2022-02-04 VITALS — BP 125/74 | HR 71 | Ht 62.0 in | Wt 199.2 lb

## 2022-02-04 DIAGNOSIS — I10 Essential (primary) hypertension: Secondary | ICD-10-CM | POA: Diagnosis not present

## 2022-02-04 DIAGNOSIS — N6323 Unspecified lump in the left breast, lower outer quadrant: Secondary | ICD-10-CM | POA: Diagnosis not present

## 2022-02-04 DIAGNOSIS — I350 Nonrheumatic aortic (valve) stenosis: Secondary | ICD-10-CM | POA: Diagnosis not present

## 2022-02-04 DIAGNOSIS — I5032 Chronic diastolic (congestive) heart failure: Secondary | ICD-10-CM

## 2022-02-04 NOTE — Progress Notes (Signed)
Cardiology Office Note:    Date:  02/04/2022   ID:  [name], DOB 19-Jun-1933, MRN [id number]  PCP:  [name], MD  Cardiologist:  [name], MD   Referring MD: [name], MD   CC: follow up  History of Present Illness:    [name] is a 88 y.o. female with a hx of chronic diastolic heart failure, hypertension who is seen for follow up today. I initially met her 07/16/19 as a new consult at the request of [name], MD for the evaluation and management of aortic stenosis and concern for diastolic heart failure.  Today: She is doing well. However, she noticed a lump in her L breast. However, the lump is not hard. She reports the lump was noticeable for several months. Her last mammogram was 2 years ago. She denies a family history of breast cancer.   She measures her blood pressure at home daily and reports a range of 170 systolic to 017/49. This morning, her blood pressure was 109/70. She believes this measurement was too low for her. However, she was not symptomatic.   When she presented to the ED for hypertensive emergency, she reports the situation was due to a mix-up of her medications. Since then, she only had one episode of  her blood pressure being about 449 systolic caused by her forgetting to take her medicine due to a death in the family.   Her PCP, Dr. Ronnald Ramp, manages her medications.   Denies chest pain, shortness of breath at rest or with normal exertion. No PND, orthopnea, LE edema or unexpected weight gain. No syncope or palpitations..  Past Medical History:  Diagnosis Date   Abdominal pain    Anemia    NOS   Anxiety    Back pain    Bruises easily    Chronic diastolic CHF (congestive heart failure) ([place])    a. 06/2013 EF 65-70%.   Chronic headaches    HISTORY OF    Depression    Diabetes mellitus    type II  DIET CONTROLLED   Diarrhea    Hepatitis    AGE 30S   Hyperlipidemia    Hypertension    Mild aortic stenosis    a.  06/2013 Echo: EF 65-70%, mod LVH with focal basal hypertrophy, very mild AS.   Osteoarthritis    Oxygen desaturation during sleep    USES 2 LITERS BEDTIME   VIA CPAP 06/2012 [facility]    Shortness of breath    WITH EXERTION USES 2 L O2 BEDTIME   Sleep apnea    CPAP WITH O2 2 LITERS 2013 ([facility])   Wears dentures    Wears glasses     Past Surgical History:  Procedure Laterality Date   ABDOMINAL HYSTERECTOMY     APPENDECTOMY     CHOLECYSTECTOMY     JOINT REPLACEMENT     LEFT KNEE    KNEE ARTHROPLASTY Right 03/22/2015   Procedure: COMPUTER ASSISTED TOTAL KNEE ARTHROPLASTY;  Surgeon: Marybelle Killings, MD;  Location: [place];  Service: Orthopedics;  Laterality: Right;   KNEE ARTHROSCOPY     RIGHT   LEFT AND RIGHT HEART CATHETERIZATION WITH CORONARY ANGIOGRAM N/A 10/28/2013   Procedure: LEFT AND RIGHT HEART CATHETERIZATION WITH CORONARY ANGIOGRAM;  Surgeon: Blane Ohara, MD;  Location: [facility] CATH LAB;  Service: Cardiovascular;  Laterality: N/A;   LEFT HEART CATHETERIZATION WITH CORONARY ANGIOGRAM N/A 11/14/2014   Procedure: LEFT HEART CATHETERIZATION WITH  CORONARY ANGIOGRAM;  Surgeon: Josue Hector, MD;  Location: [facility] CATH LAB;  Service: Cardiovascular;  Laterality: N/A;   REVISION TOTAL KNEE ARTHROPLASTY  2011   ROTATOR CUFF REPAIR  2010   SHOULDER ARTHROSCOPY  09/17/2012   Procedure: ARTHROSCOPY SHOULDER;  Surgeon: Sharmon Revere, MD;  Location: [place];  Service: Orthopedics;  Laterality: Left;   TONSILLECTOMY     TONSILLECTOMY      Current Medications: Current Outpatient Medications on File Prior to Visit  Medication Sig   albuterol (PROVENTIL) (2.5 MG/3ML) 0.083% nebulizer solution INHALE THE CONTENTS OF 1 VIAL VIA NEBULIZER TWICE DAILY AS NEEDED FOR WHEEZING OR SHORTNESS OF BREATH   carvedilol (COREG) 12.5 MG tablet Take 12.5 mg by mouth 2 (two) times daily with a meal.   Cholecalciferol 2000 units TABS Take 2 tablets (4,000 Units total) by mouth daily.   clonazePAM (KLONOPIN) 1 MG  tablet TAKE 1 TABLET(1 MG) BY MOUTH TWICE DAILY AS NEEDED FOR ANXIETY   hydrALAZINE (APRESOLINE) 50 MG tablet TAKE 1 TABLET(50 MG) BY MOUTH TWICE DAILY   HYDROcodone-acetaminophen (NORCO) 10-325 MG tablet Take 1 tablet by mouth every 6 (six) hours as needed.   omeprazole (PRILOSEC) 40 MG capsule TAKE 1 CAPSULE(40 MG) BY MOUTH DAILY   promethazine (PHENERGAN) 12.5 MG tablet TAKE 1 TABLET(12.5 MG) BY MOUTH EVERY 8 HOURS AS NEEDED FOR NAUSEA OR VOMITING   spironolactone (ALDACTONE) 25 MG tablet Take 0.5 tablets (12.5 mg total) by mouth daily.   torsemide (DEMADEX) 20 MG tablet TAKE 1 TABLET BY MOUTH EVERY DAY   traZODone (DESYREL) 100 MG tablet TAKE 1 TABLET(100 MG) BY MOUTH AT BEDTIME   Vibegron (GEMTESA) 75 MG TABS Take 75 mg by mouth daily.   MYRBETRIQ 50 MG TB24 tablet TAKE 1 TABLET(50 MG) BY MOUTH DAILY (Patient not taking: Reported on 02/01/2022)   tiZANidine (ZANAFLEX) 4 MG tablet Take 1 tablet (4 mg total) by mouth every 8 (eight) hours as needed for muscle spasms. (Patient not taking: Reported on 6/37/8588)   trolamine salicylate (ASPERCREME) 10 % cream Apply 1 application topically as needed for muscle pain. (Patient not taking: Reported on 02/04/2022)   No current facility-administered medications on file prior to visit.     Allergies:   Amlodipine, Enalapril, Losartan potassium, Metformin, and Morphine and related   Social History   Tobacco Use   Smoking status: Never   Smokeless tobacco: Never  Vaping Use   Vaping Use: Never used  Substance Use Topics   Alcohol use: No   Drug use: No    Family History: The patient's family history includes Alcohol abuse in an other family member; Cancer in her father; Emphysema in her brother; Heart disease in her brother and sister; Hypertension in an other family member; Kidney disease in an other family member; Mental illness in an other family member.  ROS:   Please see the history of present illness.   (+) L breast lump Additional  pertinent ROS otherwise unremarkable  EKGs/Labs/Other Studies Reviewed:    The following studies were reviewed today: CT Abdomen 04/28/21 IMPRESSION: 1. Descending and sigmoid colon diverticulosis with mild sigmoid colon diverticulitis. 2. Scattered diverticula of the distal ileum, without inflammatory findings along the distal ileum. 3. Other imaging findings of potential clinical significance: Aortic Atherosclerosis (ICD10-I70.0). Mild cardiomegaly. Chronically stable nonspecific right adrenal mass. Bilateral chronic hip arthropathy with chondrocalcinosis. Small bilateral groin hernias contain adipose tissue.  Echo 07/22/19  1. The left ventricle has normal systolic function with an ejection  fraction of 60-65%. The cavity size was normal. Severe basal septal hypertrophy. Left ventricular diastolic Doppler parameters are consistent with pseudonormalization.  2. The right ventricle has normal systolic function. The cavity was normal. There is no increase in right ventricular wall thickness. Right ventricular systolic pressure could not be assessed.  3. There is mild mitral annular calcification present.  4. The aortic valve is tricuspid. Moderate calcification of the aortic valve. Aortic valve regurgitation is trivial by color flow Doppler.  5. The aorta is normal unless otherwise noted.  6. The interatrial septum appears to be lipomatous.  EKG:  EKG was not ordered today  08/02/21 sinus bradycardia at 57 bpm, unchanged ST/T pattern 08/11/20 sinus rhythm at 67 bpm, nonspecific ST/T pattern anterolateral leads  Recent Labs: 04/29/2021: ALT 10 09/19/2021: TSH 4.39 11/27/2021: BUN 16; Creatinine, Ser 0.85; Hemoglobin 13.1; Platelets 164; Potassium 3.5; Sodium 139  Recent Lipid Panel    Component Value Date/Time   CHOL 183 09/19/2021 1335   TRIG 123.0 09/19/2021 1335   HDL 60.20 09/19/2021 1335   CHOLHDL 3 09/19/2021 1335   VLDL 24.6 09/19/2021 1335   LDLCALC 98 09/19/2021 1335    LDLDIRECT 121.0 06/12/2017 1349    Physical Exam:    VS:  BP 125/74    Pulse 71    Ht $R'5\' 2"'gl$  (1.575 m)    Wt 199 lb 3.2 oz (90.4 kg)    SpO2 97%    BMI 36.43 kg/m    Wt Readings from Last 3 Encounters:  02/04/22 199 lb 3.2 oz (90.4 kg)  12/13/21 203 lb (92.1 kg)  10/22/21 200 lb 3.2 oz (90.8 kg)   GEN: Well nourished, well developed in no acute distress HEENT: Normal, moist mucous membranes NECK: No JVD CARDIAC: regular rhythm, normal S1 and S2, no rubs or gallops. 2/6 systolic murmur. BREAST: at patient request, left breast palpated. Scribe [facility] [place]) present for exam. 1 x 2cm rubbery mass palpated lateral left breast VASCULAR: Radial and DP pulses 2+ bilaterally. No carotid bruits RESPIRATORY:  Clear to auscultation without rales, wheezing or rhonchi  ABDOMEN: Soft, non-tender, non-distended, Lump in L breast MUSCULOSKELETAL:  Ambulates independently SKIN: Warm and dry, no edema NEUROLOGIC:  Alert and oriented x 3. No focal neuro deficits noted. PSYCHIATRIC:  Normal affect    ASSESSMENT:    1. Chronic diastolic CHF (congestive heart failure) ([place])   2. Essential hypertension   3. Aortic stenosis, mild   4. Mass of lower outer quadrant of left breast      PLAN:    Chronic diastolic heart failure:  -appears euvolemic on exam -reviewed heart failure education, including daily weights, salt avoidance, fluid restrictions -doing much better on daily torsemide vs PRN, continue daily -she was concerned she was on too much medication. Discussed importance of BP control to prevent progression of diastolic dysfunction  Hypertension: prior hospitalization for hypertensive urgency 07/2020 -continue carvedilol 12.5 mg BID. Avoid bradycardia due to diastolic dysfunction -continue spirinolactone 12.5 mg daily -continue torsemide 20 mg daily -continue hydralazine 50 mg BID -cannot tolerate amlodipine, ACEi/ARB, see allergies  Murmur: aortic sclerosis with mild stenosis.  Unchanged on exam today  Left breast mass -palpable, rubbery mass. Reports last mammogram 2 years ago, I cannot see this. She notes it is not brand new but she does not think it was there for prior mammogram. She will discuss with Dr. Ronnald Ramp whether she should repeat a mammogram.  Plan for follow up: 6 months or sooner as neded  Shea Swalley  Harrell Gave, MD, PhD, [facility]   Medication Adjustments/Labs and Tests Ordered: Current medicines are reviewed at length with the patient today.  Concerns regarding medicines are outlined above.  No orders of the defined types were placed in this encounter.   No orders of the defined types were placed in this encounter.    Patient Instructions  Medication Instructions:  Your Physician recommend you continue on your current medication as directed.    *If you need a refill on your cardiac medications before your next appointment, please call your pharmacy*   Lab Work: None ordered today   Testing/Procedures: None ordered today   Follow-Up: At [facility], you and your health needs are our priority.  As part of our continuing mission to provide you with exceptional heart care, we have created designated Provider Care Teams.  These Care Teams include your primary Cardiologist (physician) and Advanced Practice Providers (APPs -  Physician Assistants and Nurse Practitioners) who all work together to provide you with the care you need, when you need it.  We recommend signing up for the patient portal called "MyChart".  Sign up information is provided on this After Visit Summary.  MyChart is used to connect with patients for Virtual Visits (Telemedicine).  Patients are able to view lab/test results, encounter notes, upcoming appointments, etc.  Non-urgent messages can be sent to your provider as well.   To learn more about what you can do with MyChart, go to [email or web address].    Your next appointment:   6  month(s)  The format for your next appointment:   In Person  Provider:   [name], MD{    Wilhemina Bonito as a scribe for [name], MD.,have documented all relevant documentation on the behalf of [name], MD,as directed by  [name], MD while in the presence of [name], MD.  I, [name], MD, have reviewed all documentation for this visit. The documentation on 02/04/22 for the exam, diagnosis, procedures, and orders are all accurate and complete.   Signed, [name], MD PhD 02/04/2022     [place]

## 2022-02-04 NOTE — Patient Instructions (Signed)

## 2022-02-08 ENCOUNTER — Ambulatory Visit (INDEPENDENT_AMBULATORY_CARE_PROVIDER_SITE_OTHER): Payer: Medicare HMO | Admitting: *Deleted

## 2022-02-08 DIAGNOSIS — I10 Essential (primary) hypertension: Secondary | ICD-10-CM

## 2022-02-08 DIAGNOSIS — I5032 Chronic diastolic (congestive) heart failure: Secondary | ICD-10-CM

## 2022-02-08 NOTE — Patient Instructions (Signed)
Visit Information ? ?[name], thank you for taking time to talk with me today. Please don't hesitate to contact me if I can be of assistance to you before our next scheduled telephone appointment ? ?Below are the goals we discussed today:  ?Patient Self-Care Activities: ?Patient [name] will: ?Take medications as prescribed ?Attend all scheduled provider appointments ?Call pharmacy for medication refills ?Call provider office for new concerns or questions ?Continue to follow heart healthy, low salt, low cholesterol, carbohydrate-modified, low sugar diet ?Keep up the great work preventing falls- continue to use your cane and walker if you need to ?Continue monitoring and writing down on paper your blood pressures and your daily weights at home: we will review these each time we talk on the phone ? ?Our next scheduled telephone follow up visit/ appointment is scheduled on: Monday, Apr 01, 2022 at 11:30 am- This is a PHONE CALL appointment ? ?If you need to cancel or re-schedule our visit, please call [phone] and our care guide team will be happy to assist you. ?  ?I look forward to hearing about your progress. ?  ?Oneta Rack, RN, BSN, CCRN Alumnus ?[place] ?([phone]: direct office ? ?If you are experiencing a Mental Health or [place] or need someone to talk to, please  ?call the Suicide and Crisis Lifeline: 988 ?call the [place] National Suicide Prevention Lifeline: (615) 281-3113 or TTY: 939-049-2166 TTY 563-064-7687) to talk to a trained counselor ?call 1-800-273-TALK (toll free, 24 hour hotline) ?go to [facility] [address], [place] [phone]) ?call 911  ? ?Patient verbalizes understanding of instructions and care plan provided today and agrees to view in [place]. Active MyChart status confirmed with patient ? ?Heart Failure, Self-Care ?Heart failure is a serious condition. The following  information explains things you need to do to take care of yourself at home. To help you stay as healthy as possible, you may be asked to change your diet, take certain medicines, and make other changes in your life. Your doctor may also give you more specific instructions. If you have problems or questions, call your doctor. ?What are the risks? ?Having heart failure makes it more likely for you to have some problems. These problems can get worse if you do not take good care of yourself. Problems may include: ?Damage to the kidneys, liver, or lungs. ?Malnutrition. ?Abnormal heart rhythms. ?Blood clotting problems that could cause a stroke. ?Supplies needed: ?Scale for weighing yourself. ?Blood pressure monitor. ?Notebook. ?Medicines. ?How to care for yourself when you have heart failure ?Medicines ?Take over-the-counter and prescription medicines only as told by your doctor. Take your medicines every day. ?Do not stop taking your medicine unless your doctor tells you to do so. ?Do not skip any medicines. ?Get your prescriptions refilled before you run out of medicine. This is important. ?Talk with your doctor if you cannot afford your medicines. ?Eating and drinking ? ?Eat heart-healthy foods. Talk with a diet specialist (dietitian) to create an eating plan. ?Limit salt (sodium) if told by your doctor. Ask your diet specialist to tell you which seasonings are healthy for your heart. ?Cook in healthy ways instead of frying. Healthy ways of cooking include roasting, grilling, broiling, baking, poaching, steaming, and stir-frying. ?Choose foods that: ?Have no trans fat. ?Are low in saturated fat and cholesterol. ?Choose healthy foods, such as: ?Fresh or frozen fruits and vegetables. ?Fish. ?Low-fat (lean) meats. ?Legumes, such as beans, peas, and  lentils. ?Fat-free or low-fat dairy products. ?Whole-grain foods. ?High-fiber foods. ?Limit how much fluid you drink, if told by your doctor. ?Alcohol use ?Do not drink  alcohol if: ?Your doctor tells you not to drink. ?Your heart was damaged by alcohol, or you have very bad heart failure. ?You are pregnant, may be pregnant, or are planning to become pregnant. ?If you drink alcohol: ?Limit how much you have to: ?0-1 drink a day for women. ?0-2 drinks a day for men. ?Know how much alcohol is in your drink. In the [place] one drink equals one 12 oz bottle of beer (355 mL), one 5 oz glass of wine (148 mL), or one 1? oz glass of hard liquor (44 mL). ?Lifestyle ? ?Do not smoke or use any products that contain nicotine or tobacco. If you need help quitting, ask your doctor. ?Do not use nicotine gum or patches before talking to your doctor. ?Do not use illegal drugs. ?Lose weight if told by your doctor. ?Do physical activity if told by your doctor. Talk to your doctor before you begin an exercise if: ?You are an older adult. ?You have very bad heart failure. ?Learn to manage stress. If you need help, ask your doctor. ?Get physical rehab (rehabilitation) to help you stay independent and to help with your quality of life. ?Participate in a cardiac rehab program. This program helps you improve your health through exercise, education, and counseling. ?Plan time to rest when you get tired. ?Check weight and blood pressure ? ?Weigh yourself every day. This will help you to know if fluid is building up in your body. ?Weigh yourself every morning after you pee (urinate) and before you eat breakfast. ?Wear the same amount of clothing each time. ?Write down your daily weight. Give your record to your doctor. ?Check and write down your blood pressure as told by your doctor. ?Check your pulse as told by your doctor. ?Dealing with very hot and very cold weather ?If it is very hot: ?Avoid activities that take a lot of energy. ?Use air conditioning or fans, or find a cooler place. ?Avoid caffeine and alcohol. ?Wear clothing that is loose-fitting, lightweight, and light-colored. ?If it is very cold: ?Avoid  activities that take a lot of energy. ?Layer your clothes. ?Wear mittens or gloves, a hat, and a face covering when you go outside. ?Avoid alcohol. ?Follow these instructions at home: ?Stay up to date with shots (vaccines). Get pneumococcal and flu (influenza) shots. ?Keep all follow-up visits. ?Contact a doctor if: ?You gain 2-3 lb (1-1.4 kg) in 24 hours or 5 lb (2.3 kg) in a week. ?You have increasing shortness of breath. ?You cannot do your normal activities. ?You get tired easily. ?You cough a lot. ?You do not feel like eating or feel like you may vomit (nauseous). ?You have swelling in your hands, feet, ankles, or belly (abdomen). ?You cannot sleep well because it is hard to breathe. ?You feel like your heart is beating fast (palpitations). ?You get dizzy when you stand up. ?You feel depressed or sad. ?Get help right away if: ?You have trouble breathing. ?You or someone else notices a change in your behavior, such as having trouble staying awake. ?You have chest pain or discomfort. ?You pass out (faint). ?These symptoms may be an emergency. Get help right away. Call your local emergency services (911 in the [place] ?Do not wait to see if the symptoms will go away. ?Do not drive yourself to the hospital. ?Summary ?Heart failure is a serious  condition. To care for yourself, you may have to change your diet, take medicines, and make other lifestyle changes. ?Take your medicines every day. Do not stop taking them unless your doctor tells you to do so. ?Limit salt and eat heart-healthy foods. ?Ask your doctor if you can drink alcohol. You may have to stop alcohol use if you have very bad heart failure. ?Contact your doctor if you gain weight quickly or feel that your heart is beating too fast. Get help right away if you pass out or have chest pain or trouble breathing. ?This information is not intended to replace advice given to you by your health care provider. Make sure you discuss any questions you have with your  health care provider. ?Document Revised: 06/03/2020 Document Reviewed: 06/03/2020 ?Elsevier Patient Education ? 2022 [place]. ?   ? ?

## 2022-02-08 NOTE — Chronic Care Management (AMB) (Signed)
?Chronic Care Management  ? ?CCM RN Visit Note ? ?02/08/2022 ?Name: [name] MRN: [id number] DOB: 06-30-1933 ? ?Subjective: ?[name] is a 88 y.o. year old female who is a primary care patient of [name], MD. The care management team was consulted for assistance with disease management and care coordination needs.   ? ?Engaged with patient by telephone for follow up visit in response to provider referral for case management and/or care coordination services.  ? ?Consent to Services:  ?The patient was given information about Chronic Care Management services, agreed to services, and gave verbal consent prior to initiation of services.  Please see initial visit note for detailed documentation.  ?Patient agreed to services and verbal consent obtained.  ? ?Assessment: Review of patient past medical history, allergies, medications, health status, including review of consultants reports, laboratory and other test data, was performed as part of comprehensive evaluation and provision of chronic care management services.  ?CCM Care Plan ? ?Allergies  ?Allergen Reactions  ? Amlodipine Other (See Comments)  ?  Reaction:  Dizziness   ? Enalapril Cough  ? Losartan Potassium Other (See Comments)  ?  Reaction:  GI pain   ? Metformin Diarrhea  ? Morphine And Related Itching and Nausea And Vomiting  ? ?Outpatient Encounter Medications as of 02/08/2022  ?Medication Sig  ? albuterol (PROVENTIL) (2.5 MG/3ML) 0.083% nebulizer solution INHALE THE CONTENTS OF 1 VIAL VIA NEBULIZER TWICE DAILY AS NEEDED FOR WHEEZING OR SHORTNESS OF BREATH  ? carvedilol (COREG) 12.5 MG tablet Take 12.5 mg by mouth 2 (two) times daily with a meal.  ? Cholecalciferol 2000 units TABS Take 2 tablets (4,000 Units total) by mouth daily.  ? clonazePAM (KLONOPIN) 1 MG tablet TAKE 1 TABLET(1 MG) BY MOUTH TWICE DAILY AS NEEDED FOR ANXIETY  ? hydrALAZINE (APRESOLINE) 50 MG tablet TAKE 1 TABLET(50 MG) BY MOUTH TWICE DAILY  ? HYDROcodone-acetaminophen (NORCO)  10-325 MG tablet Take 1 tablet by mouth every 6 (six) hours as needed.  ? MYRBETRIQ 50 MG TB24 tablet TAKE 1 TABLET(50 MG) BY MOUTH DAILY (Patient not taking: Reported on 02/01/2022)  ? omeprazole (PRILOSEC) 40 MG capsule TAKE 1 CAPSULE(40 MG) BY MOUTH DAILY  ? promethazine (PHENERGAN) 12.5 MG tablet TAKE 1 TABLET(12.5 MG) BY MOUTH EVERY 8 HOURS AS NEEDED FOR NAUSEA OR VOMITING  ? spironolactone (ALDACTONE) 25 MG tablet Take 0.5 tablets (12.5 mg total) by mouth daily.  ? tiZANidine (ZANAFLEX) 4 MG tablet Take 1 tablet (4 mg total) by mouth every 8 (eight) hours as needed for muscle spasms. (Patient not taking: Reported on 02/04/2022)  ? torsemide (DEMADEX) 20 MG tablet TAKE 1 TABLET BY MOUTH EVERY DAY  ? traZODone (DESYREL) 100 MG tablet TAKE 1 TABLET(100 MG) BY MOUTH AT BEDTIME  ? trolamine salicylate (ASPERCREME) 10 % cream Apply 1 application topically as needed for muscle pain. (Patient not taking: Reported on 02/04/2022)  ? Vibegron (GEMTESA) 75 MG TABS Take 75 mg by mouth daily.  ? ?No facility-administered encounter medications on file as of 02/08/2022.  ? ?Patient Active Problem List  ? Diagnosis Date Noted  ? Chronic hyperglycemia 09/19/2021  ? Chronic pain 04/28/2021  ? Encounter for palliative care involving management of pain 09/20/2020  ? Excessive somnolence disorder 04/12/2020  ? Routine general medical examination at a health care facility 04/10/2020  ? Anxiety 08/26/2018  ? Seizure ([place]) 08/25/2018  ? GERD (gastroesophageal reflux disease) 08/25/2018  ? Chronic diastolic CHF (congestive heart failure) ([place]) 08/25/2018  ?  Major depressive disorder, recurrent episode with anxious distress ([place]) 02/04/2017  ? Dementia arising in the senium and presenium ([place]) 06/27/2016  ? Obesity, Class II, BMI 35-39.9, with comorbidity 05/15/2016  ? Insomnia w/ sleep apnea 10/16/2015  ? IBS (irritable bowel syndrome) 02/27/2015  ? Hashimoto's thyroiditis 05/05/2014  ? Adrenal incidentaloma ([place]) 11/04/2013  ? Benign  paroxysmal positional vertigo 07/15/2013  ? Aortic stenosis, mild 07/14/2013  ? OAB (overactive bladder) 02/24/2013  ? GERD with stricture 12/24/2012  ? Sleep-related hypoventilation 07/30/2012  ? OSA (obstructive sleep apnea) 04/16/2012  ? Osteopenia 04/16/2012  ? Vitamin D deficiency 12/04/2010  ? Hyperlipidemia with target LDL less than 130 12/03/2010  ? GAD (generalized anxiety disorder) 12/03/2010  ? Essential hypertension 12/03/2010  ? Osteoarthritis 12/03/2010  ? ?Conditions to be addressed/monitored:  CHF and HTN ? ?Care Plan : CCM RN CM Plan of Care  ?Updates made by Knox Royalty, RN since 02/08/2022 12:00 AM  ?  ? ?Problem: Chronic Disease Management Needs   ?Priority: High  ?  ? ?Long-Range Goal: Ongoing adherence to established plan of care for long term chronic disease management   ?Start Date: 01/16/2022  ?Expected End Date: 01/16/2023  ?Priority: High  ?Note:   ?Current Barriers:  ?Chronic Disease Management support and education needs related to CHF and HTN ?Recent ED visit 11/27/21- dyspnea: discharged home to self-care ? ?RNCM Clinical Goal(s):  ?Patient will demonstrate ongoing health management independence as evidenced by adherence to plan of care for HTN/ CHF        through collaboration with RN Care manager, provider, and care team.  ? ?Interventions: ?1:1 collaboration with primary care provider regarding development and update of comprehensive plan of care as evidenced by provider attestation and co-signature ?Inter-disciplinary care team collaboration (see longitudinal plan of care) ?CCM RN CM Initial assessment completed 01/16/22 ?02/08/22: ?Evaluation of current treatment plan related to  self management and patient's adherence to plan as established by provider ?Pain discussed: patient declines to quantify pain today, states, "not bad, about as much as it always;" continues to report good coping with pain using established plan of care/ medications ?Confirmed no new/ recent falls x 12  months: continues to use cane/ walker as needed; positive reinforcement provided with encouragement to continue efforts, previously provided education around fall risks/ prevention reinforced ?Confirmed no medication concerns/ recent changes: patient reports continues to independently self-manage medications and endorses adherence to all prescribed medications ?Reviewed recent cardiology provider office visit- states visit went "well;" she confirms she discussed the lump she recently noticed in breast with provider- she has a mammogram scheduled 03/16/22 ?Discussed plans with patient for ongoing care management follow up and provided patient with direct contact information for care management team  ? ?Heart Failure and HTN Interventions:  (Status: 02/08/22: Goal on Track (progressing): YES.)  Long Term Goal  ?Wt Readings from Last 3 Encounters:  ?02/04/22 199 lb 3.2 oz (90.4 kg)  ?12/13/21 203 lb (92.1 kg)  ?10/22/21 200 lb 3.2 oz (90.8 kg)  ?Last practice recorded BP readings:  ?BP Readings from Last 3 Encounters:  ?02/04/22 125/74  ?12/13/21 120/72  ?11/27/21 (!) 179/73  ?Most recent eGFR/CrCl: No results found for: EGFR  No components found for: CRCL ? ?Evaluation of current treatment plan related to hypertension self management and patient's adherence to plan as established by provider ?Provided education to patient re: stroke prevention, s/s of heart attack and stroke  ?Reviewed recent blood pressures at home: patient reports ongoing consistent values between  109-150/60-88; reports blood pressure today of "124/60" ? ?Basic overview and discussion of pathophysiology of Heart Failure reviewed ?Advised patient to weigh each morning after emptying bladder ?Discussed importance of daily weight and advised patient to weigh and record daily ?Reviewed role of diuretics in prevention of fluid overload and management of heart failure ?Discussed the importance of keeping all appointments with provider ?Confirmed patient  monitors and has now started recording daily weights at home: reports weight ranges consistently between 198-200 lbs, with a recalled weight this morning of "198.6 lbs" ?Reinforced previously provided education

## 2022-02-13 DIAGNOSIS — R928 Other abnormal and inconclusive findings on diagnostic imaging of breast: Secondary | ICD-10-CM | POA: Diagnosis not present

## 2022-02-13 DIAGNOSIS — N6320 Unspecified lump in the left breast, unspecified quadrant: Secondary | ICD-10-CM | POA: Diagnosis not present

## 2022-02-13 LAB — HM MAMMOGRAPHY

## 2022-02-14 ENCOUNTER — Telehealth: Payer: Self-pay | Admitting: Internal Medicine

## 2022-02-14 NOTE — Telephone Encounter (Signed)
1.Medication Requested: HYDROcodone-acetaminophen (NORCO) 10-325 MG tablet ? ?2. Pharmacy (Name, Street, [place]): [facility] [phone] - [place], [address] AT [address] ? ?3. On Med List: Y ? ?4. Last Visit with PCP: 09-19-2021 ? ?5. Next visit date with PCP: 03-20-2022 ? ? ?Agent: Please be advised that RX refills may take up to 3 business days. We ask that you follow-up with your pharmacy.  ?

## 2022-02-15 ENCOUNTER — Other Ambulatory Visit: Payer: Self-pay | Admitting: Internal Medicine

## 2022-02-15 DIAGNOSIS — M159 Polyosteoarthritis, unspecified: Secondary | ICD-10-CM

## 2022-02-15 DIAGNOSIS — G894 Chronic pain syndrome: Secondary | ICD-10-CM

## 2022-02-15 DIAGNOSIS — Z515 Encounter for palliative care: Secondary | ICD-10-CM

## 2022-02-15 MED ORDER — HYDROCODONE-ACETAMINOPHEN 10-325 MG PO TABS: 1.0000 | ORAL_TABLET | Freq: Four times a day (QID) | ORAL | 0 refills | Status: DC | PRN

## 2022-02-22 DIAGNOSIS — I110 Hypertensive heart disease with heart failure: Secondary | ICD-10-CM | POA: Diagnosis not present

## 2022-02-22 DIAGNOSIS — I509 Heart failure, unspecified: Secondary | ICD-10-CM

## 2022-03-04 ENCOUNTER — Telehealth: Payer: Self-pay

## 2022-03-04 NOTE — Progress Notes (Signed)
? ? ?Chronic Care Management ?Pharmacy Assistant  ? ?Name: [name]  MRN: [id number] DOB: 10-30-1933 ? ?[name] is an 88 y.o. year old female who was called for her follow-up assessment call.  ? ?Reason for Encounter: Disease State ?  ?Conditions to be addressed/monitored: ?HTN ? ? ?Recent office visits:  ?None ID ? ?Recent consult visits:  ?None ID ? ?Hospital visits:  ?None since last coordination call ? ?Medications: ?Outpatient Encounter Medications as of 03/04/2022  ?Medication Sig  ? albuterol (PROVENTIL) (2.5 MG/3ML) 0.083% nebulizer solution INHALE THE CONTENTS OF 1 VIAL VIA NEBULIZER TWICE DAILY AS NEEDED FOR WHEEZING OR SHORTNESS OF BREATH  ? carvedilol (COREG) 12.5 MG tablet Take 12.5 mg by mouth 2 (two) times daily with a meal.  ? Cholecalciferol 2000 units TABS Take 2 tablets (4,000 Units total) by mouth daily.  ? clonazePAM (KLONOPIN) 1 MG tablet TAKE 1 TABLET(1 MG) BY MOUTH TWICE DAILY AS NEEDED FOR ANXIETY  ? hydrALAZINE (APRESOLINE) 50 MG tablet TAKE 1 TABLET(50 MG) BY MOUTH TWICE DAILY  ? HYDROcodone-acetaminophen (NORCO) 10-325 MG tablet Take 1 tablet by mouth every 6 (six) hours as needed.  ? MYRBETRIQ 50 MG TB24 tablet TAKE 1 TABLET(50 MG) BY MOUTH DAILY (Patient not taking: Reported on 02/01/2022)  ? omeprazole (PRILOSEC) 40 MG capsule TAKE 1 CAPSULE(40 MG) BY MOUTH DAILY  ? promethazine (PHENERGAN) 12.5 MG tablet TAKE 1 TABLET(12.5 MG) BY MOUTH EVERY 8 HOURS AS NEEDED FOR NAUSEA OR VOMITING  ? spironolactone (ALDACTONE) 25 MG tablet Take 0.5 tablets (12.5 mg total) by mouth daily.  ? tiZANidine (ZANAFLEX) 4 MG tablet Take 1 tablet (4 mg total) by mouth every 8 (eight) hours as needed for muscle spasms. (Patient not taking: Reported on 02/04/2022)  ? torsemide (DEMADEX) 20 MG tablet TAKE 1 TABLET BY MOUTH EVERY DAY  ? traZODone (DESYREL) 100 MG tablet TAKE 1 TABLET(100 MG) BY MOUTH AT BEDTIME  ? trolamine salicylate (ASPERCREME) 10 % cream Apply 1 application topically as needed for muscle  pain. (Patient not taking: Reported on 02/04/2022)  ? Vibegron (GEMTESA) 75 MG TABS Take 75 mg by mouth daily.  ? ?No facility-administered encounter medications on file as of 03/04/2022.  ? ?Reviewed chart prior to disease state call. Spoke with patient regarding BP ? ?Recent Office Vitals: ?BP Readings from Last 3 Encounters:  ?02/04/22 125/74  ?12/13/21 120/72  ?11/27/21 (!) 179/73  ? ?Pulse Readings from Last 3 Encounters:  ?02/04/22 71  ?12/13/21 65  ?11/27/21 73  ?  ?Wt Readings from Last 3 Encounters:  ?02/04/22 199 lb 3.2 oz (90.4 kg)  ?12/13/21 203 lb (92.1 kg)  ?10/22/21 200 lb 3.2 oz (90.8 kg)  ?  ? ?Kidney Function ?Lab Results  ?Component Value Date/Time  ? CREATININE 0.85 11/27/2021 05:03 PM  ? CREATININE 0.70 09/19/2021 01:35 PM  ? GFR 78.22 09/19/2021 01:35 PM  ? GFRNONAA >60 11/27/2021 05:03 PM  ? GFRAA >60 08/13/2020 05:47 AM  ? ? ? ?  Latest Ref Rng & Units 11/27/2021  ?  5:03 PM 09/19/2021  ?  1:35 PM 04/29/2021  ?  3:47 AM  ?BMP  ?Glucose 70 - 99 mg/dL 112   90   113    ?BUN 8 - 23 mg/dL '16   14   11    '$ ?Creatinine 0.44 - 1.00 mg/dL 0.85   0.70   0.69    ?Sodium 135 - 145 mmol/L 139   139   140    ?Potassium  3.5 - 5.1 mmol/L 3.5   4.0   3.7    ?Chloride 98 - 111 mmol/L 103   101   105    ?CO2 22 - 32 mmol/L '27   31   28    '$ ?Calcium 8.9 - 10.3 mg/dL 9.8   9.6   9.0    ? ? ?Reviewed chart prior to disease state call. Spoke with patient regarding BP ?Current antihypertensive regimen:  ?Hydralazine 50 mg BID ?Spironolactone 25 mg Daily ?Torsemide 20 mg Daily ?Carvedilol 12.5 mg BID ? ?How often are you checking your Blood Pressure? Patient states that she takes blood pressure daily ? ?Current home BP readings: Blood pressure this morning was 137/66, yesterday 133/74, and Saturday was 141/76 ? ?What recent interventions/DTPs have been made by any provider to improve Blood Pressure control since last CPP Visit: none ID ? ?Any recent hospitalizations or ED visits since last visit with CPP? No ? ?What diet  changes have been made to improve Blood Pressure Control?  ?Patient states that she does not have much of an appetite. Tries to eat healthy foods like fruit and vegetables ? ?What exercise is being done to improve your Blood Pressure Control?  ?Patient states that she walks with a walker so she really does not do much. She also states that she has back pain and hip pain that kind of keeps her exercising. ? ?Adherence Review: ?Is the patient currently on ACE/ARB medication? No ?Does the patient have >5 day gap between last estimated fill dates? No ? ?Care Gaps: ?Colonoscopy-NA ?Diabetic Foot Exam-NA ?Mammogram-NA ?Ophthalmology-NA ?Dexa Scan - NA ?Annual Well Visit - 09/19/21 ?Micro albumin-NA ?Hemoglobin A1c-09/19/21 ?  ?Star Rating Drugs: ?None ID ?  ?Ethelene Hal ?Clinical Pharmacist Assistant ?[phone]  ? ?

## 2022-03-07 ENCOUNTER — Other Ambulatory Visit: Payer: Self-pay | Admitting: Cardiology

## 2022-03-07 ENCOUNTER — Other Ambulatory Visit: Payer: Self-pay | Admitting: Internal Medicine

## 2022-03-07 DIAGNOSIS — K219 Gastro-esophageal reflux disease without esophagitis: Secondary | ICD-10-CM

## 2022-03-12 ENCOUNTER — Ambulatory Visit: Payer: Medicare HMO | Admitting: Pulmonary Disease

## 2022-03-12 ENCOUNTER — Encounter: Payer: Self-pay | Admitting: Pulmonary Disease

## 2022-03-12 VITALS — BP 124/76 | HR 61 | Temp 98.1°F | Ht 62.0 in | Wt 199.8 lb

## 2022-03-12 DIAGNOSIS — G4733 Obstructive sleep apnea (adult) (pediatric): Secondary | ICD-10-CM | POA: Diagnosis not present

## 2022-03-12 DIAGNOSIS — Z9989 Dependence on other enabling machines and devices: Secondary | ICD-10-CM

## 2022-03-12 NOTE — Patient Instructions (Signed)
Continue CPAP therapy ? ?I will see you in 6 months ? ?Call with significant concerns ? ?Your CPAP download shows that the CPAP is working well ?

## 2022-03-12 NOTE — Progress Notes (Signed)
? ?Subjective:  ? ? Patient ID: [name], female    DOB: 23-May-1933, 88 y.o.   MRN: [id number] ? ?Patient being seen for history of obstructive sleep apnea ? ?Has been compliant with CPAP use ?Appears to be tolerating CPAP well ? ?Has sleep study had shown obstructive sleep apnea with significant desaturations,  ?Was initially not willing to use CPAP but has gotten used to using it ? ?She had had CPAP in the past and discontinued use ? ?Diagnosed with obstructive sleep apnea in 2013 ? ?Shortness of breath with moderate exertion ? ?Goes to bed about 10-11 PM ?Takes about 35 minutes to 40 minutes to fall asleep ? ?Wakes up in the morning about 5 AM, takes her few hours to finally get out of bed sometimes up until 10 AM ? ? ? ?Past Medical History:  ?Diagnosis Date  ? Abdominal pain   ? Anemia   ? NOS  ? Anxiety   ? Back pain   ? Bruises easily   ? Chronic diastolic CHF (congestive heart failure) ([place])   ? a. 06/2013 EF 65-70%.  ? Chronic headaches   ? HISTORY OF   ? Depression   ? Diabetes mellitus   ? type II  DIET CONTROLLED  ? Diarrhea   ? Hepatitis   ? AGE 30S  ? Hyperlipidemia   ? Hypertension   ? Mild aortic stenosis   ? a. 06/2013 Echo: EF 65-70%, mod LVH with focal basal hypertrophy, very mild AS.  ? Osteoarthritis   ? Oxygen desaturation during sleep   ? USES 2 LITERS BEDTIME   VIA CPAP 06/2012 [facility]   ? Shortness of breath   ? WITH EXERTION USES 2 L O2 BEDTIME  ? Sleep apnea   ? CPAP WITH O2 2 LITERS 2013 ([facility])  ? Wears dentures   ? Wears glasses   ? ?Family History  ?Problem Relation Age of Onset  ? Cancer Father   ?     prostate cancer  ? Heart disease Sister   ? Heart disease Brother   ? Alcohol abuse Other   ? Hypertension Other   ? Kidney disease Other   ? Mental illness Other   ? Emphysema Brother   ? ?Social History  ? ?Socioeconomic History  ? Marital status: Widowed  ?  Spouse name: Not on file  ? Number of children: 5  ? Years of education: Not on file  ? Highest education level: Not on  file  ?Occupational History  ? Occupation: retired   ?  Employer: RETIRED  ?  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[employer]  ?Tobacco Use  ? Smoking status: Never  ? Smokeless tobacco: Never  ?Vaping Use  ? Vaping Use: Never used  ?Substance and Sexual Activity  ? Alcohol use: No  ? Drug use: No  ? Sexual activity: Never  ?Other Topics Concern  ? Not on file  ?Social History Narrative  ? Not on file  ? ?Social Determinants of Health  ? ?Financial Resource Strain: Low Risk   ? Difficulty of Paying Living Expenses: Not hard at all  ?Food Insecurity: No Food Insecurity  ? Worried About Charity fundraiser in the Last Year: Never true  ? Ran Out of Food in the Last Year: Never true  ?Transportation Needs: No Transportation Needs  ? Lack of Transportation (Medical): No  ? Lack of Transportation (Non-Medical): No  ?Physical Activity: Inactive  ? Days of Exercise per Week: 0  days  ? Minutes of Exercise per Session: 0 min  ?Stress: No Stress Concern Present  ? Feeling of Stress : Not at all  ?Social Connections: Socially Isolated  ? Frequency of Communication with Friends and Family: Three times a week  ? Frequency of Social Gatherings with Friends and Family: Three times a week  ? Attends Religious Services: Never  ? Active Member of Clubs or Organizations: No  ? Attends Archivist Meetings: Never  ? Marital Status: Widowed  ?Intimate Partner Violence: Not At Risk  ? Fear of Current or Ex-Partner: No  ? Emotionally Abused: No  ? Physically Abused: No  ? Sexually Abused: No  ? ?Review of Systems  ?Constitutional:  Negative for fever and unexpected weight change.  ?HENT:  Positive for congestion and trouble swallowing. Negative for dental problem, ear pain, nosebleeds, postnasal drip, rhinorrhea, sinus pressure, sneezing and sore throat.   ?Eyes:  Negative for redness and itching.  ?Respiratory:  Positive for shortness of breath. Negative for cough, chest tightness and wheezing.   ?Cardiovascular:  Positive for leg swelling.  Negative for palpitations.  ?Gastrointestinal:  Negative for nausea and vomiting.  ?Genitourinary:  Negative for dysuria.  ?Musculoskeletal:  Negative for joint swelling.  ?Skin:  Negative for rash.  ?Allergic/Immunologic: Negative.  Negative for environmental allergies, food allergies and immunocompromised state.  ?Neurological:  Negative for headaches.  ?Hematological:  Bruises/bleeds easily.  ?Psychiatric/Behavioral:  Negative for dysphoric mood. The patient is nervous/anxious.   ? ?   ?Objective:  ? Physical Exam ?Constitutional:   ?   Appearance: She is obese.  ?HENT:  ?   Head: Normocephalic and atraumatic.  ?   Nose: No congestion.  ?Eyes:  ?   Pupils: Pupils are equal, round, and reactive to light.  ?Cardiovascular:  ?   Rate and Rhythm: Normal rate and regular rhythm.  ?Pulmonary:  ?   Effort: Pulmonary effort is normal. No respiratory distress.  ?   Breath sounds: Normal breath sounds. No stridor. No wheezing or rhonchi.  ?Musculoskeletal:  ?   Cervical back: No rigidity or tenderness.  ?Neurological:  ?   Mental Status: She is alert.  ?Psychiatric:     ?   Mood and Affect: Mood normal.  ? ?There were no vitals filed for this visit. ? ?   ?CPAP compliance shows 100% compliance with CPAP ?AutoSet at 5-15 ?Residual AHI 1.4 ?95% of the pressure of 10.3 ? ?Assessment & Plan:  ?.  Nonrestorative sleep ?Marland Kitchen  Daytime sleepiness ?Marland Kitchen  Nocturnal desaturations ?Marland Kitchen  Obstructive sleep apnea ? ?.  Patient has been using CPAP and seems to be compliant and benefiting from CPAP use ? ?Graded exercises as tolerated ? ?.  Encouraged to call with any significant concerns ? ?CPAP seems to be working well at present ? ?I will see her back in about 6 months ?

## 2022-03-14 ENCOUNTER — Other Ambulatory Visit: Payer: Self-pay | Admitting: Internal Medicine

## 2022-03-14 ENCOUNTER — Telehealth: Payer: Self-pay

## 2022-03-14 DIAGNOSIS — Z515 Encounter for palliative care: Secondary | ICD-10-CM

## 2022-03-14 DIAGNOSIS — M159 Polyosteoarthritis, unspecified: Secondary | ICD-10-CM

## 2022-03-14 DIAGNOSIS — G894 Chronic pain syndrome: Secondary | ICD-10-CM

## 2022-03-14 MED ORDER — HYDROCODONE-ACETAMINOPHEN 10-325 MG PO TABS: 1.0000 | ORAL_TABLET | Freq: Four times a day (QID) | ORAL | 0 refills | Status: DC | PRN

## 2022-03-14 NOTE — Telephone Encounter (Signed)
Pt is requesting a refill on: ?HYDROcodone-acetaminophen (NORCO) 10-325 MG tablet ? ?PHARMACY: ?[facility] [phone] - [place], [place] AT [address] ? ?LOV 09/15/21 ?ROV 03/20/22 ?

## 2022-03-20 ENCOUNTER — Encounter: Payer: Self-pay | Admitting: Internal Medicine

## 2022-03-20 ENCOUNTER — Ambulatory Visit (INDEPENDENT_AMBULATORY_CARE_PROVIDER_SITE_OTHER): Payer: Medicare HMO | Admitting: Internal Medicine

## 2022-03-20 VITALS — BP 140/82 | HR 57 | Temp 98.1°F | Ht 62.0 in | Wt 202.0 lb

## 2022-03-20 DIAGNOSIS — G4710 Hypersomnia, unspecified: Secondary | ICD-10-CM | POA: Diagnosis not present

## 2022-03-20 DIAGNOSIS — G4733 Obstructive sleep apnea (adult) (pediatric): Secondary | ICD-10-CM | POA: Diagnosis not present

## 2022-03-20 DIAGNOSIS — T402X5A Adverse effect of other opioids, initial encounter: Secondary | ICD-10-CM | POA: Insufficient documentation

## 2022-03-20 DIAGNOSIS — K5904 Chronic idiopathic constipation: Secondary | ICD-10-CM | POA: Diagnosis not present

## 2022-03-20 MED ORDER — MODAFINIL 100 MG PO TABS: 100.0000 mg | ORAL_TABLET | Freq: Every day | ORAL | 0 refills | Status: DC

## 2022-03-20 MED ORDER — TRULANCE 3 MG PO TABS: 1.0000 | ORAL_TABLET | Freq: Every day | ORAL | 1 refills | Status: DC

## 2022-03-20 NOTE — Patient Instructions (Signed)

## 2022-03-20 NOTE — Progress Notes (Signed)
? ?Subjective:  ?Patient ID: [name], female    DOB: 04-Mar-1933  Age: 88 y.o. MRN: [id number] ? ?CC: Hypertension ? ? ?HPI ?[name] presents for f/up - ? ?She complains of excessive daytime fatigue, constipation, and frequent urination.  She has her baseline level of dyspnea on exertion with no recent episodes of chest pain, diaphoresis, or edema. ? ?Outpatient Medications Prior to Visit  ?Medication Sig Dispense Refill  ? albuterol (PROVENTIL) (2.5 MG/3ML) 0.083% nebulizer solution INHALE THE CONTENTS OF 1 VIAL VIA NEBULIZER TWICE DAILY AS NEEDED FOR WHEEZING OR SHORTNESS OF BREATH 75 mL 3  ? carvedilol (COREG) 12.5 MG tablet TAKE 1 TABLET(12.5 MG) BY MOUTH TWICE DAILY WITH A MEAL 180 tablet 1  ? Cholecalciferol 2000 units TABS Take 2 tablets (4,000 Units total) by mouth daily. 180 tablet 1  ? clonazePAM (KLONOPIN) 1 MG tablet TAKE 1 TABLET(1 MG) BY MOUTH TWICE DAILY AS NEEDED FOR ANXIETY 60 tablet 5  ? hydrALAZINE (APRESOLINE) 50 MG tablet TAKE 1 TABLET(50 MG) BY MOUTH TWICE DAILY 180 tablet 1  ? HYDROcodone-acetaminophen (NORCO) 10-325 MG tablet Take 1 tablet by mouth every 6 (six) hours as needed. 90 tablet 0  ? MYRBETRIQ 50 MG TB24 tablet TAKE 1 TABLET(50 MG) BY MOUTH DAILY 90 tablet 1  ? omeprazole (PRILOSEC) 40 MG capsule TAKE 1 CAPSULE(40 MG) BY MOUTH DAILY 90 capsule 1  ? promethazine (PHENERGAN) 12.5 MG tablet TAKE 1 TABLET(12.5 MG) BY MOUTH EVERY 8 HOURS AS NEEDED FOR NAUSEA OR VOMITING 20 tablet 2  ? spironolactone (ALDACTONE) 25 MG tablet Take 0.5 tablets (12.5 mg total) by mouth daily. 45 tablet 0  ? tiZANidine (ZANAFLEX) 4 MG tablet Take 1 tablet (4 mg total) by mouth every 8 (eight) hours as needed for muscle spasms. 30 tablet 1  ? torsemide (DEMADEX) 20 MG tablet TAKE 1 TABLET BY MOUTH EVERY DAY 90 tablet 0  ? traZODone (DESYREL) 100 MG tablet TAKE 1 TABLET(100 MG) BY MOUTH AT BEDTIME 90 tablet 0  ? trolamine salicylate (ASPERCREME) 10 % cream Apply 1 application. topically as needed for  muscle pain.    ? Vibegron (GEMTESA) 75 MG TABS Take 75 mg by mouth daily.    ? ?No facility-administered medications prior to visit.  ? ? ?ROS ?Review of Systems  ?Constitutional:  Positive for fatigue. Negative for diaphoresis and unexpected weight change.  ?HENT: Negative.    ?Eyes: Negative.   ?Respiratory:  Positive for apnea and shortness of breath. Negative for cough, chest tightness and wheezing.   ?Cardiovascular:  Negative for chest pain, palpitations and leg swelling.  ?Gastrointestinal:  Positive for constipation. Negative for abdominal pain, diarrhea and nausea.  ?Endocrine: Positive for polyuria. Negative for polyphagia.  ?Genitourinary: Negative.  Negative for difficulty urinating.  ?Musculoskeletal:  Positive for arthralgias and back pain. Negative for myalgias.  ?Skin: Negative.  Negative for color change and pallor.  ?Neurological:  Negative for dizziness, weakness and light-headedness.  ?Hematological:  Negative for adenopathy. Does not bruise/bleed easily.  ?Psychiatric/Behavioral: Negative.    ? ?Objective:  ?BP 140/82 (BP Location: Right Arm, Patient Position: Sitting, Cuff Size: Large)   Pulse (!) 57   Temp 98.1 ?F (36.7 ?C) (Oral)   Ht '5\' 2"'$  (1.575 m)   Wt 202 lb (91.6 kg)   SpO2 93%   BMI 36.95 kg/m?  ? ?BP Readings from Last 3 Encounters:  ?03/20/22 140/82  ?03/12/22 124/76  ?02/04/22 125/74  ? ? ?Wt Readings from Last 3 Encounters:  ?  03/20/22 202 lb (91.6 kg)  ?03/12/22 199 lb 12.8 oz (90.6 kg)  ?02/04/22 199 lb 3.2 oz (90.4 kg)  ? ? ?Physical Exam ?Vitals reviewed.  ?Constitutional:   ?   Appearance: She is not ill-appearing.  ?HENT:  ?   Nose: Nose normal.  ?   Mouth/Throat:  ?   Mouth: Mucous membranes are moist.  ?Eyes:  ?   General: No scleral icterus. ?   Conjunctiva/sclera: Conjunctivae normal.  ?Cardiovascular:  ?   Rate and Rhythm: Normal rate and regular rhythm.  ?   Heart sounds: No murmur heard. ?Pulmonary:  ?   Effort: Pulmonary effort is normal.  ?   Breath sounds: No  stridor. No wheezing, rhonchi or rales.  ?Abdominal:  ?   General: Abdomen is protuberant. Bowel sounds are normal. There is no distension.  ?   Palpations: Abdomen is soft. There is no hepatomegaly, splenomegaly or mass.  ?   Tenderness: There is no abdominal tenderness.  ?Musculoskeletal:     ?   General: Normal range of motion.  ?   Cervical back: Neck supple.  ?   Right lower leg: No edema.  ?   Left lower leg: No edema.  ?Lymphadenopathy:  ?   Cervical: No cervical adenopathy.  ?Skin: ?   General: Skin is warm and dry.  ?Neurological:  ?   General: No focal deficit present.  ?   Mental Status: She is alert.  ? ? ?Lab Results  ?Component Value Date  ? WBC 8.5 11/27/2021  ? HGB 13.1 11/27/2021  ? HCT 42.0 11/27/2021  ? PLT 164 11/27/2021  ? GLUCOSE 112 (H) 11/27/2021  ? CHOL 183 09/19/2021  ? TRIG 123.0 09/19/2021  ? HDL 60.20 09/19/2021  ? LDLDIRECT 121.0 06/12/2017  ? [place] 98 09/19/2021  ? ALT 10 04/29/2021  ? AST 13 (L) 04/29/2021  ? NA 139 11/27/2021  ? K 3.5 11/27/2021  ? CL 103 11/27/2021  ? CREATININE 0.85 11/27/2021  ? BUN 16 11/27/2021  ? CO2 27 11/27/2021  ? TSH 4.39 09/19/2021  ? INR 1.0 08/11/2020  ? HGBA1C 4.8 09/19/2021  ? ? ?DG Chest Portable 1 View ? ?Result Date: 11/27/2021 ?CLINICAL DATA:  Hyperventilation, difficulty breathing EXAM: PORTABLE CHEST 1 VIEW COMPARISON:  08/12/2020 FINDINGS: Cardiomegaly. Both lungs are clear. The visualized skeletal structures are unremarkable. IMPRESSION: Cardiomegaly without acute abnormality of the lungs in AP portable projection. Electronically Signed   By: Delanna Ahmadi M.D.   On: 11/27/2021 14:28  ? ? ?Assessment & Plan:  ? ?[name] was seen today for hypertension. ? ?Diagnoses and all orders for this visit: ? ?Chronic idiopathic constipation ?-     Plecanatide (TRULANCE) 3 MG TABS; Take 1 tablet by mouth daily. ? ?Excessive somnolence disorder ?-     modafinil (PROVIGIL) 100 MG tablet; Take 1 tablet (100 mg total) by mouth daily. ? ?OSA (obstructive sleep  apnea) ?-     modafinil (PROVIGIL) 100 MG tablet; Take 1 tablet (100 mg total) by mouth daily. ? ? ?I am having [name] start on Trulance and modafinil. I am also having her maintain her Cholecalciferol, promethazine, trolamine salicylate, spironolactone, Myrbetriq, hydrALAZINE, albuterol, Gemtesa, tiZANidine, clonazePAM, torsemide, traZODone, omeprazole, carvedilol, and HYDROcodone-acetaminophen. ? ?Meds ordered this encounter  ?Medications  ? Plecanatide (TRULANCE) 3 MG TABS  ?  Sig: Take 1 tablet by mouth daily.  ?  Dispense:  90 tablet  ?  Refill:  1  ? modafinil (PROVIGIL) 100 MG  tablet  ?  Sig: Take 1 tablet (100 mg total) by mouth daily.  ?  Dispense:  90 tablet  ?  Refill:  0  ? ? ? ?Follow-up: Return in about 6 months (around 09/19/2022). ? ?Scarlette Calico, MD ?

## 2022-03-21 ENCOUNTER — Telehealth: Payer: Self-pay

## 2022-03-21 NOTE — Telephone Encounter (Signed)
Pt was advised that she needs a PA for Plecanatide (TRULANCE) 3 MG TABS and modafinil (PROVIGIL) 100 MG tablet. ? ?Please advise ?

## 2022-03-21 NOTE — Telephone Encounter (Signed)
Key: [id number] ?Approved through12/31/2023  ?

## 2022-03-22 NOTE — Telephone Encounter (Signed)
Approved through 11/24/2022. °

## 2022-03-22 NOTE — Telephone Encounter (Signed)
Key: [id number] ?

## 2022-03-28 ENCOUNTER — Telehealth: Payer: Medicare HMO

## 2022-03-28 NOTE — Progress Notes (Unsigned)
? ?Chronic Care Management ?Pharmacy Note ? ?03/28/2022 ?Name:  [name] MRN:  [id number] DOB:  Mar 06, 1933 ? ?Summary: ?-Home BP range 105/71-169/86 per patient; SBP 130s-140s most often ?-Pt does not always take 2nd dose of carvedilol, hydralazine due to awkward timing of meds in middle of day ?-Trazodone 150 mg was too high and pt wants to return to 100 mg dose ?-Pt requests refill for albuterol solution #3 boxes at a time ?-Pt requests refill for spironolactone ? ?Recommendations/Changes made from today's visit: ?-Advised to organize all meds into AM and PM pill box so she only takes meds twice a day ?-Refilled trazodone 100 mg, spironolactone 25 mg, and albuterol solution #3 boxes ? ? ?Subjective: ?[name] is an 88 y.o. year old female who is a primary patient of [name], MD.  The CCM team was consulted for assistance with disease management and care coordination needs.   ? ?Engaged with patient by telephone for follow up visit in response to provider referral for pharmacy case management and/or care coordination services.  ? ?Consent to Services:  ?The patient was given information about Chronic Care Management services, agreed to services, and gave verbal consent prior to initiation of services.  Please see initial visit note for detailed documentation.  ? ?Patient Care Team: ?[name], MD as PCP - General ?[name], MD as PCP - Cardiology (Cardiology) ?[name], MD as Consulting Physician (Cardiology) ?Foltanski, Cleaster Cor[place], [facility] as Pharmacist (Pharmacist) ?Hortencia Pilar, MD as Consulting Physician (Ophthalmology) ?Knox Royalty, RN as Case Manager ? ?Recent office visits: ?05/08/21 Pricilla Holm, MD (PCP) - Acute diverticulitis - Amoxicillin- Pot Clavulanate 975-125 mg prescribed, Promethazine 12.5 mg prescribed, Soft foods and counseling on diet,  ?Try Pepcid OTC, Follow up as needed. ? ?03/20/21 Dr Ronnald Ramp OV: c/o heartburn/nausea. Rx'd  ondansetron ? ?Recent consult visits: ?08/02/21 Dr Harrell Gave (cardiology): f/u CHF. No med changes ? ?05/23/21 overnight oximetry showed low O2 sat 88%, ordered O2 2L for sleep. ? ?05/14/21 Weston Brass MD, Pulmonology - Ordered overnight oximetry. Follow up 3 months. ? ?Hospital visits: ?Medication Reconciliation was completed by comparing discharge summary, patient?s EMR and Pharmacy list, and upon discussion with patient. ?  ?Admitted to the hospital on 04/29/21 due to Acute diverticulitis. Discharge date was 04/30/21. Discharged from [facility].   ?-treated with analgesics, IV fluids and IV abx. ? ?Medications that remain the same after Hospital Discharge:??  ?-All other medications will remain the same.   ? ? ?Objective: ? ?Lab Results  ?Component Value Date  ? CREATININE 0.85 11/27/2021  ? BUN 16 11/27/2021  ? GFR 78.22 09/19/2021  ? GFRNONAA >60 11/27/2021  ? GFRAA >60 08/13/2020  ? NA 139 11/27/2021  ? K 3.5 11/27/2021  ? CALCIUM 9.8 11/27/2021  ? CO2 27 11/27/2021  ? GLUCOSE 112 (H) 11/27/2021  ? ? ?Lab Results  ?Component Value Date/Time  ? HGBA1C 4.8 09/19/2021 01:35 PM  ? HGBA1C 5.0 08/26/2018 01:16 AM  ? GFR 78.22 09/19/2021 01:35 PM  ? GFR 65.88 03/20/2021 01:44 PM  ?  ?Last diabetic Eye exam:  ?Lab Results  ?Component Value Date/Time  ? HMDIABEYEEXA No Retinopathy 12/11/2017 12:00 AM  ?  ?Last diabetic Foot exam:  ?Lab Results  ?Component Value Date/Time  ? HMDIABFOOTEX normal 12/24/2012 12:00 AM  ?  ? ?Lab Results  ?Component Value Date  ? CHOL 183 09/19/2021  ? HDL 60.20 09/19/2021  ? [place] 98 09/19/2021  ? LDLDIRECT 121.0 06/12/2017  ?  TRIG 123.0 09/19/2021  ? CHOLHDL 3 09/19/2021  ? ? ? ?  Latest Ref Rng & Units 04/29/2021  ?  3:47 AM 04/28/2021  ?  6:04 PM 08/11/2020  ?  7:57 PM  ?Hepatic Function  ?Total Protein 6.5 - 8.1 g/dL 6.2   6.5   7.0    ?Albumin 3.5 - 5.0 g/dL 3.4   3.6   3.7    ?AST 15 - 41 U/L _0 ?ALT 0 - 44 U/L _1 ?Alk Phosphatase 38 - 126 U/L 51   52    69    ?Total Bilirubin 0.3 - 1.2 mg/dL 0.9   1.3   1.0    ? ? ?Lab Results  ?Component Value Date/Time  ? TSH 4.39 09/19/2021 01:35 PM  ? TSH 3.85 04/10/2020 02:03 PM  ? FREET4 0.46 (L) 04/24/2016 08:14 AM  ? FREET4 0.48 (L) 04/15/2016 04:14 PM  ? ? ? ?  Latest Ref Rng & Units 11/27/2021  ?  5:03 PM 04/29/2021  ?  3:47 AM 04/28/2021  ?  1:54 PM  ?CBC  ?WBC 4.0 - 10.5 K/uL 8.5   8.2   7.2    ?Hemoglobin 12.0 - 15.0 g/dL 13.1   12.5   10.5    ?Hematocrit 36.0 - 46.0 % 42.0   42.0   34.6    ?Platelets 150 - 400 K/uL 164   153   128    ? ? ?Lab Results  ?Component Value Date/Time  ? VD25OH 21.10 (L) 03/30/2018 01:44 PM  ? VD25OH 28.56 05/25/2014 02:13 PM  ? ? ?Clinical ASCVD: No  ?The ASCVD Risk score (Arnett DK, et al., 2019) failed to calculate for the following reasons: ?  The 2019 ASCVD risk score is only valid for ages 108 to 32   ? ? ?  02/01/2022  ? 11:08 AM 02/01/2022  ? 11:06 AM 01/16/2022  ? 10:30 AM  ?Depression screen PHQ 2/9  ?Decreased Interest 0 0 0  ?Down, Depressed, Hopeless 0 0 0  ?PHQ - 2 Score 0 0 0  ?  ? ?Social History  ? ?Tobacco Use  ?Smoking Status Never  ?Smokeless Tobacco Never  ? ?BP Readings from Last 3 Encounters:  ?03/20/22 140/82  ?03/12/22 124/76  ?02/04/22 125/74  ? ?Pulse Readings from Last 3 Encounters:  ?03/20/22 (!) 57  ?03/12/22 61  ?02/04/22 71  ? ?Wt Readings from Last 3 Encounters:  ?03/20/22 202 lb (91.6 kg)  ?03/12/22 199 lb 12.8 oz (90.6 kg)  ?02/04/22 199 lb 3.2 oz (90.4 kg)  ? ?BMI Readings from Last 3 Encounters:  ?03/20/22 36.95 kg/m?  ?03/12/22 36.54 kg/m?  ?02/04/22 36.43 kg/m?  ? ? ?Assessment/Interventions: Review of patient past medical history, allergies, medications, health status, including review of consultants reports, laboratory and other test data, was performed as part of comprehensive evaluation and provision of chronic care management services.  ? ?SDOH:  (Social Determinants of Health) assessments and interventions performed: Yes ? ?SDOH Screenings  ? ?Alcohol  Screen: Low Risk   ? Last Alcohol Screening Score (AUDIT): 0  ?Depression (PHQ2-9): Low Risk   ? PHQ-2 Score: 0  ?Financial Resource Strain: Low Risk   ? Difficulty of Paying Living Expenses: Not hard at all  ?Food Insecurity: No Food Insecurity  ? Worried About Charity fundraiser in the Last Year: Never true  ? Ran Out of  Food in the Last Year: Never true  ?Housing: Low Risk   ? Last Housing Risk Score: 0  ?Physical Activity: Inactive  ? Days of Exercise per Week: 0 days  ? Minutes of Exercise per Session: 0 min  ?Social Connections: Socially Isolated  ? Frequency of Communication with Friends and Family: Three times a week  ? Frequency of Social Gatherings with Friends and Family: Three times a week  ? Attends Religious Services: Never  ? Active Member of Clubs or Organizations: No  ? Attends Archivist Meetings: Never  ? Marital Status: Widowed  ?Stress: No Stress Concern Present  ? Feeling of Stress : Not at all  ?Tobacco Use: Low Risk   ? Smoking Tobacco Use: Never  ? Smokeless Tobacco Use: Never  ? Passive Exposure: Not on file  ?Transportation Needs: No Transportation Needs  ? Lack of Transportation (Medical): No  ? Lack of Transportation (Non-Medical): No  ? ? ?CCM Care Plan ? ?Allergies  ?Allergen Reactions  ? Amlodipine Other (See Comments)  ?  Reaction:  Dizziness   ? Enalapril Cough  ? Losartan Potassium Other (See Comments)  ?  Reaction:  GI pain   ? Metformin Diarrhea  ? Morphine And Related Itching and Nausea And Vomiting  ? ? ?Medications Reviewed Today   ? ? Reviewed by [name], MD (Physician) on 03/20/22 at 1330  Med List Status: <None>  ? ?Medication Order Taking? Sig Documenting Provider Last Dose Status Informant  ?albuterol (PROVENTIL) (2.5 MG/3ML) 0.083% nebulizer solution [id number] Yes INHALE THE CONTENTS OF 1 VIAL VIA NEBULIZER TWICE DAILY AS NEEDED FOR WHEEZING OR SHORTNESS OF Cherly Beach, MD Taking Active   ?carvedilol (COREG) 12.5 MG tablet [id number] Yes TAKE  1 TABLET(12.5 MG) BY MOUTH TWICE DAILY WITH A MEAL [name], MD Taking Active   ?Cholecalciferol 2000 units TABS [id number] Yes Take 2 tablets (4,000 Units total) by mouth daily. [name], MD Cyndie Chime

## 2022-04-01 ENCOUNTER — Ambulatory Visit (INDEPENDENT_AMBULATORY_CARE_PROVIDER_SITE_OTHER): Payer: Medicare HMO | Admitting: *Deleted

## 2022-04-01 ENCOUNTER — Other Ambulatory Visit: Payer: Self-pay | Admitting: Internal Medicine

## 2022-04-01 DIAGNOSIS — I5032 Chronic diastolic (congestive) heart failure: Secondary | ICD-10-CM

## 2022-04-01 DIAGNOSIS — I10 Essential (primary) hypertension: Secondary | ICD-10-CM

## 2022-04-01 NOTE — Chronic Care Management (AMB) (Signed)
?Chronic Care Management  ? ?CCM RN Visit Note ? ?04/01/2022 ?Name: ANYIA GIERKE MRN: [id number] DOB: October 16, 1933 ? ?Subjective: ?AILA TERRA is a 88 y.o. year old female who is a primary care patient of [name], MD. The care management team was consulted for assistance with disease management and care coordination needs.   ? ?Engaged with patient by telephone for follow up visit in response to provider referral for case management and/or care coordination services.  ? ?Consent to Services:  ?The patient was given information about Chronic Care Management services, agreed to services, and gave verbal consent prior to initiation of services.  Please see initial visit note for detailed documentation.  ?Patient agreed to services and verbal consent obtained.  ? ?Assessment: Review of patient past medical history, allergies, medications, health status, including review of consultants reports, laboratory and other test data, was performed as part of comprehensive evaluation and provision of chronic care management services.  ? ?SDOH (Social Determinants of Health) assessments and interventions performed:  ?SDOH Interventions   ? ?Flowsheet Row Most Recent Value  ?SDOH Interventions   ?Food Insecurity Interventions Intervention Not Indicated  [Continues to deny food insecurity]  ?Housing Interventions Intervention Not Indicated  [Patient continues living alone in single family one level home  with family nearby that check on her daily]  ?Transportation Interventions Intervention Not Indicated  [reports continues driving self,  family assists as indicated/ needed]  ? ?  ?CCM Care Plan ? ?Allergies  ?Allergen Reactions  ? Amlodipine Other (See Comments)  ?  Reaction:  Dizziness   ? Enalapril Cough  ? Losartan Potassium Other (See Comments)  ?  Reaction:  GI pain   ? Metformin Diarrhea  ? Morphine And Related Itching and Nausea And Vomiting  ? ?Outpatient Encounter Medications as of 04/01/2022  ?Medication Sig Note  ?  albuterol (PROVENTIL) (2.5 MG/3ML) 0.083% nebulizer solution INHALE THE CONTENTS OF 1 VIAL VIA NEBULIZER TWICE DAILY AS NEEDED FOR WHEEZING OR SHORTNESS OF BREATH   ? carvedilol (COREG) 12.5 MG tablet TAKE 1 TABLET(12.5 MG) BY MOUTH TWICE DAILY WITH A MEAL   ? Cholecalciferol 2000 units TABS Take 2 tablets (4,000 Units total) by mouth daily.   ? clonazePAM (KLONOPIN) 1 MG tablet TAKE 1 TABLET(1 MG) BY MOUTH TWICE DAILY AS NEEDED FOR ANXIETY 04/01/2022: 04/01/22: Patient reports she would like to have an alternate medication that is not classified as a benzodiazepine- states she definitely needs something for her nerves/ anxiety- but prefers not to take benzo due to side effects such as dizziness  ? hydrALAZINE (APRESOLINE) 50 MG tablet TAKE 1 TABLET(50 MG) BY MOUTH TWICE DAILY   ? HYDROcodone-acetaminophen (NORCO) 10-325 MG tablet Take 1 tablet by mouth every 6 (six) hours as needed.   ? modafinil (PROVIGIL) 100 MG tablet Take 1 tablet (100 mg total) by mouth daily. (Patient not taking: Reported on 04/01/2022) 04/01/2022: 04/01/22- reports not taking- states "it made my anxiety and my nervousness much worse"   ? MYRBETRIQ 50 MG TB24 tablet TAKE 1 TABLET(50 MG) BY MOUTH DAILY   ? omeprazole (PRILOSEC) 40 MG capsule TAKE 1 CAPSULE(40 MG) BY MOUTH DAILY   ? Plecanatide (TRULANCE) 3 MG TABS Take 1 tablet by mouth daily.   ? promethazine (PHENERGAN) 12.5 MG tablet TAKE 1 TABLET(12.5 MG) BY MOUTH EVERY 8 HOURS AS NEEDED FOR NAUSEA OR VOMITING   ? spironolactone (ALDACTONE) 25 MG tablet Take 0.5 tablets (12.5 mg total) by mouth daily.   ? tiZANidine (  ZANAFLEX) 4 MG tablet Take 1 tablet (4 mg total) by mouth every 8 (eight) hours as needed for muscle spasms.   ? torsemide (DEMADEX) 20 MG tablet TAKE 1 TABLET BY MOUTH EVERY DAY   ? traZODone (DESYREL) 100 MG tablet TAKE 1 TABLET(100 MG) BY MOUTH AT BEDTIME   ? trolamine salicylate (ASPERCREME) 10 % cream Apply 1 application. topically as needed for muscle pain.   ? Vibegron  (GEMTESA) 75 MG TABS Take 75 mg by mouth daily.   ? ?No facility-administered encounter medications on file as of 04/01/2022.  ? ?Patient Active Problem List  ? Diagnosis Date Noted  ? Therapeutic opioid-induced constipation (OIC) 03/20/2022  ? Encounter for palliative care involving management of pain 09/20/2020  ? Excessive somnolence disorder 04/12/2020  ? Routine general medical examination at a health care facility 04/10/2020  ? Anxiety 08/26/2018  ? Seizure ([place]) 08/25/2018  ? GERD (gastroesophageal reflux disease) 08/25/2018  ? Chronic diastolic CHF (congestive heart failure) ([place]) 08/25/2018  ? Major depressive disorder, recurrent episode with anxious distress ([place]) 02/04/2017  ? Dementia arising in the senium and presenium ([place]) 06/27/2016  ? Obesity, Class II, BMI 35-39.9, with comorbidity 05/15/2016  ? Insomnia w/ sleep apnea 10/16/2015  ? IBS (irritable bowel syndrome) 02/27/2015  ? Hashimoto's thyroiditis 05/05/2014  ? Adrenal incidentaloma ([place]) 11/04/2013  ? Benign paroxysmal positional vertigo 07/15/2013  ? Aortic stenosis, mild 07/14/2013  ? OAB (overactive bladder) 02/24/2013  ? GERD with stricture 12/24/2012  ? Sleep-related hypoventilation 07/30/2012  ? OSA (obstructive sleep apnea) 04/16/2012  ? Osteopenia 04/16/2012  ? Vitamin D deficiency 12/04/2010  ? Hyperlipidemia with target LDL less than 130 12/03/2010  ? GAD (generalized anxiety disorder) 12/03/2010  ? Essential hypertension 12/03/2010  ? Osteoarthritis 12/03/2010  ? ?Conditions to be addressed/monitored:  CHF and HTN ? ?Care Plan : CCM RN CM Plan of Care  ?Updates made by Knox Royalty, RN since 04/01/2022 12:00 AM  ?  ? ?Problem: Chronic Disease Management Needs   ?Priority: High  ?  ? ?Long-Range Goal: Ongoing adherence to established plan of care for long term chronic disease management   ?Start Date: 01/16/2022  ?Expected End Date: 01/16/2023  ?Priority: High  ?Note:   ?Current Barriers:  ?Chronic Disease Management support and  education needs related to CHF and HTN ?Recent ED visit 11/27/21- dyspnea: discharged home to self-care ? ?RNCM Clinical Goal(s):  ?Patient will demonstrate ongoing health management independence as evidenced by adherence to plan of care for HTN/ CHF        through collaboration with RN Care manager, provider, and care team.  ? ?Interventions: ?1:1 collaboration with primary care provider regarding development and update of comprehensive plan of care as evidenced by provider attestation and co-signature ?Inter-disciplinary care team collaboration (see longitudinal plan of care) ?CCM RN CM Initial assessment completed 01/16/22 ?Review of patient status, including review of consultants reports, relevant laboratory and other test results, and medications completed ?SDOH updated: no new/ unmet concerns identified ?Pain assessment updated: reports ongoing unchanged chronic back pain- reports continues taking prescribed medications as instructed and reports "they work pretty good to ease the pain for short periods of time;" denies concerns or changes to chronic pain levels ?Falls assessment updated: continues to deny new/ recent falls x 12 months- continues using cane and walker as indicated/ needed;  positive reinforcement provided with encouragement to continue efforts at fall prevention; previously provided education around fall risks/ prevention reinforced ?Reviewed recent PCP office visit 03/20/22 with patient,  including new prescriptions and post-office visit instructions- she verbalizes a good understanding of same ?Medications discussed: reports continues to independently self-manage medications; endorses adherence to taking all medications as prescribed ?She tells me today that she would like for Dr. Ronnald Ramp to prescribe an alternate medication (currently taking clonazepam) for anxiety that "is not a benzodiazepine-- states "it makes me dizzy, and it has bad side effects;" she tells me she "definitely needs something  for anxiety;" but "just do not want a benzodiazepine;" will make PCP aware of patient's request/ feedback ?Reports not taking recently prescribed Modafinil- states "when I took it it made me very jittery and increased m

## 2022-04-01 NOTE — Patient Instructions (Signed)
Visit Information ? ?[name], thank you for taking time to talk with me today. Please don't hesitate to contact me if I can be of assistance to you before our next scheduled telephone appointment ? ?As you requested today, I have made dr. Ronnald Ramp aware that you are not taking Modafinil and that you would like a prescription for different anxiety medication- please listen out for a call from your outpatient pharmacy or the office staff at Dr. Ronnald Ramp' office, for follow up on this matter- if you have not heard anything by the end of this week, please contact Dr. Ronnald Ramp' office at [phone] to follow up on this matter ? ?Below are the goals we discussed today:  ?Patient Self-Care Activities: ?Patient [name] will: ?Take medications as prescribed ?Attend all scheduled provider appointments ?Call pharmacy for medication refills ?Call provider office for new concerns or questions ?Continue to follow heart healthy, low salt, low cholesterol, carbohydrate-modified, low sugar diet ?Keep up the great work preventing falls- continue to use your cane and walker if you need to ?Continue monitoring and writing down on paper your blood pressures and your daily weights at home: we will review these each time we talk on the phone ? ?Our next scheduled telephone follow up visit/ appointment is scheduled on: Monday, July 01, 2022 at 11:30 am- This is a PHONE [place] appointment ? ?If you need to cancel or re-schedule our visit, please call [phone] and our care guide team will be happy to assist you. ?  ?I look forward to hearing about your progress. ?  ?Oneta Rack, RN, BSN, CCRN Alumnus ?[place] ?([phone]: direct office ? ?If you are experiencing a Mental Health or [place] or need someone to talk to, please  ?call the Suicide and Crisis Lifeline: 988 ?call the [place] National Suicide Prevention Lifeline: 438-052-2058 or TTY: 940-402-1860 TTY (585) 870-6401) to talk  to a trained counselor ?call 1-800-273-TALK (toll free, 24 hour hotline) ?go to [facility] [address] [place] [phone]) ?call 911  ? ?Patient verbalizes understanding of instructions and care plan provided today and agrees to view in [place]. Active MyChart status confirmed with patient ? ?Managing Anxiety, Adult ?After being diagnosed with anxiety, you may be relieved to know why you have felt or behaved a certain way. You may also feel overwhelmed about the treatment ahead and what it will mean for your life. With care and support, you can manage this condition. ?How to manage lifestyle changes ?Managing stress and anxiety ? ?Stress is your body's reaction to life changes and events, both good and bad. Most stress will last just a few hours, but stress can be ongoing and can lead to more than just stress. Although stress can play a major role in anxiety, it is not the same as anxiety. Stress is usually caused by something external, such as a deadline, test, or competition. Stress normally passes after the triggering event has ended.  ?Anxiety is caused by something internal, such as imagining a terrible outcome or worrying that something will go wrong that will devastate you. Anxiety often does not go away even after the triggering event is over, and it can become long-term (chronic) worry. It is important to understand the differences between stress and anxiety and to manage your stress effectively so that it does not lead to an anxious response. ?Talk with your health care provider or a counselor to learn more about reducing anxiety and  stress. He or she may suggest tension reduction techniques, such as: ?Music therapy. Spend time creating or listening to music that you enjoy and that inspires you. ?Mindfulness-based meditation. Practice being aware of your normal breaths while not trying to control your breathing. It can be done while sitting or  walking. ?Centering prayer. This involves focusing on a word, phrase, or sacred image that means something to you and brings you peace. ?Deep breathing. To do this, expand your stomach and inhale slowly through your nose. Hold your breath for 3-5 seconds. Then exhale slowly, letting your stomach muscles relax. ?Self-talk. Learn to notice and identify thought patterns that lead to anxiety reactions and change those patterns to thoughts that feel peaceful. ?Muscle relaxation. Taking time to tense muscles and then relax them. ?Choose a tension reduction technique that fits your lifestyle and personality. These techniques take time and practice. Set aside 5-15 minutes a day to do them. Therapists can offer counseling and training in these techniques. The training to help with anxiety may be covered by some insurance plans. ?Other things you can do to manage stress and anxiety include: ?Keeping a stress diary. This can help you learn what triggers your reaction and then learn ways to manage your response. ?Thinking about how you react to certain situations. You may not be able to control everything, but you can control your response. ?Making time for activities that help you relax and not feeling guilty about spending your time in this way. ?Doing visual imagery. This involves imagining or creating mental pictures to help you relax. ?Practicing yoga. Through yoga poses, you can lower tension and promote relaxation. ? ?Medicines ?Medicines can help ease symptoms. Medicines for anxiety include: ?Antidepressant medicines. These are usually prescribed for long-term daily control. ?Anti-anxiety medicines. These may be added in severe cases, especially when panic attacks occur. ?Medicines will be prescribed by a health care provider. When used together, medicines, psychotherapy, and tension reduction techniques may be the most effective treatment. ?Relationships ?Relationships can play a big part in helping you recover. Try to  spend more time connecting with trusted friends and family members. ?Consider going to couples counseling if you have a partner, taking family education classes, or going to family therapy. ?Therapy can help you and others better understand your condition. ?How to recognize changes in your anxiety ?Everyone responds differently to treatment for anxiety. Recovery from anxiety happens when symptoms decrease and stop interfering with your daily activities at home or work. This may mean that you will start to: ?Have better concentration and focus. Worry will interfere less in your daily thinking. ?Sleep better. ?Be less irritable. ?Have more energy. ?Have improved memory. ?It is also important to recognize when your condition is getting worse. Contact your health care provider if your symptoms interfere with home or work and you feel like your condition is not improving. ?Follow these instructions at home: ?Activity ?Exercise. Adults should do the following: ?Exercise for at least 150 minutes each week. The exercise should increase your heart rate and make you sweat (moderate-intensity exercise). ?Strengthening exercises at least twice a week. ?Get the right amount and quality of sleep. Most adults need 7-9 hours of sleep each night. ?Lifestyle ? ?Eat a healthy diet that includes plenty of vegetables, fruits, whole grains, low-fat dairy products, and lean protein. ?Do not eat a lot of foods that are high in fats, added sugars, or salt (sodium). ?Make choices that simplify your life. ?Do not use any products that contain nicotine or  tobacco. These products include cigarettes, chewing tobacco, and vaping devices, such as e-cigarettes. If you need help quitting, ask your health care provider. ?Avoid caffeine, alcohol, and certain over-the-counter cold medicines. These may make you feel worse. Ask your pharmacist which medicines to avoid. ?General instructions ?Take over-the-counter and prescription medicines only as told  by your health care provider. ?Keep all follow-up visits. This is important. ?Where to find support ?You can get help and support from these sources: ?Self-help groups. ?Online and [employer]. ?A trusted

## 2022-04-03 DIAGNOSIS — R350 Frequency of micturition: Secondary | ICD-10-CM | POA: Diagnosis not present

## 2022-04-03 DIAGNOSIS — N3946 Mixed incontinence: Secondary | ICD-10-CM | POA: Diagnosis not present

## 2022-04-06 ENCOUNTER — Other Ambulatory Visit: Payer: Self-pay | Admitting: Internal Medicine

## 2022-04-06 DIAGNOSIS — G4700 Insomnia, unspecified: Secondary | ICD-10-CM

## 2022-04-11 ENCOUNTER — Telehealth: Payer: Self-pay | Admitting: Internal Medicine

## 2022-04-11 ENCOUNTER — Other Ambulatory Visit: Payer: Self-pay | Admitting: Internal Medicine

## 2022-04-11 DIAGNOSIS — M150 Primary generalized (osteo)arthritis: Secondary | ICD-10-CM

## 2022-04-11 DIAGNOSIS — Z515 Encounter for palliative care: Secondary | ICD-10-CM

## 2022-04-11 DIAGNOSIS — M159 Polyosteoarthritis, unspecified: Secondary | ICD-10-CM

## 2022-04-11 DIAGNOSIS — G894 Chronic pain syndrome: Secondary | ICD-10-CM

## 2022-04-11 MED ORDER — HYDROCODONE-ACETAMINOPHEN 10-325 MG PO TABS: 1.0000 | ORAL_TABLET | Freq: Four times a day (QID) | ORAL | 0 refills | Status: DC | PRN

## 2022-04-11 NOTE — Telephone Encounter (Signed)
PT calls today in regards to getting a medication refill. PT would like a refill on their HYDROcodone-acetaminophen [facility]) sent out to the [facility] on [address].  I let PT know it can take up to 3 days for this RX.

## 2022-04-24 DIAGNOSIS — I509 Heart failure, unspecified: Secondary | ICD-10-CM

## 2022-04-24 DIAGNOSIS — I110 Hypertensive heart disease with heart failure: Secondary | ICD-10-CM

## 2022-04-26 ENCOUNTER — Telehealth: Payer: Self-pay

## 2022-04-26 NOTE — Chronic Care Management (AMB) (Signed)
Chronic Care Management Pharmacy Assistant   Name: [name]  MRN: [id number] DOB: 1932-12-03   Reason for Encounter: Disease State   Conditions to be addressed/monitored: HTN   Recent office visits:  None since last coordination call 04/01/22  Recent consult visits:  None since last coordination call 04/01/22  Hospital visits:  None since last coordination call 04/01/22  Medications: Outpatient Encounter Medications as of 04/26/2022  Medication Sig Note   albuterol (PROVENTIL) (2.5 MG/3ML) 0.083% nebulizer solution INHALE THE CONTENTS OF 1 VIAL VIA NEBULIZER TWICE DAILY AS NEEDED FOR WHEEZING OR SHORTNESS OF BREATH    carvedilol (COREG) 12.5 MG tablet TAKE 1 TABLET(12.5 MG) BY MOUTH TWICE DAILY WITH A MEAL    Cholecalciferol 2000 units TABS Take 2 tablets (4,000 Units total) by mouth daily.    clonazePAM (KLONOPIN) 1 MG tablet TAKE 1 TABLET(1 MG) BY MOUTH TWICE DAILY AS NEEDED FOR ANXIETY 04/01/2022: 04/01/22: Patient reports she would like to have an alternate medication that is not classified as a benzodiazepine- states she definitely needs something for her nerves/ anxiety- but prefers not to take benzo due to side effects such as dizziness   hydrALAZINE (APRESOLINE) 50 MG tablet TAKE 1 TABLET(50 MG) BY MOUTH TWICE DAILY    HYDROcodone-acetaminophen (NORCO) 10-325 MG tablet Take 1 tablet by mouth every 6 (six) hours as needed.    MYRBETRIQ 50 MG TB24 tablet TAKE 1 TABLET(50 MG) BY MOUTH DAILY    omeprazole (PRILOSEC) 40 MG capsule TAKE 1 CAPSULE(40 MG) BY MOUTH DAILY    Plecanatide (TRULANCE) 3 MG TABS Take 1 tablet by mouth daily.    promethazine (PHENERGAN) 12.5 MG tablet TAKE 1 TABLET(12.5 MG) BY MOUTH EVERY 8 HOURS AS NEEDED FOR NAUSEA OR VOMITING    spironolactone (ALDACTONE) 25 MG tablet Take 0.5 tablets (12.5 mg total) by mouth daily.    tiZANidine (ZANAFLEX) 4 MG tablet Take 1 tablet (4 mg total) by mouth every 8 (eight) hours as needed for muscle spasms.    torsemide  (DEMADEX) 20 MG tablet TAKE 1 TABLET BY MOUTH EVERY DAY    traZODone (DESYREL) 100 MG tablet TAKE 1 TABLET(100 MG) BY MOUTH AT BEDTIME    trolamine salicylate (ASPERCREME) 10 % cream Apply 1 application. topically as needed for muscle pain.    Vibegron (GEMTESA) 75 MG TABS Take 75 mg by mouth daily.    No facility-administered encounter medications on file as of 04/26/2022.    Recent Office Vitals: BP Readings from Last 3 Encounters:  03/20/22 140/82  03/12/22 124/76  02/04/22 125/74   Pulse Readings from Last 3 Encounters:  03/20/22 (!) 57  03/12/22 61  02/04/22 71    Wt Readings from Last 3 Encounters:  03/20/22 202 lb (91.6 kg)  03/12/22 199 lb 12.8 oz (90.6 kg)  02/04/22 199 lb 3.2 oz (90.4 kg)     Kidney Function Lab Results  Component Value Date/Time   CREATININE 0.85 11/27/2021 05:03 PM   CREATININE 0.70 09/19/2021 01:35 PM   GFR 78.22 09/19/2021 01:35 PM   GFRNONAA >60 11/27/2021 05:03 PM   GFRAA >60 08/13/2020 05:47 AM       Latest Ref Rng & Units 11/27/2021    5:03 PM 09/19/2021    1:35 PM 04/29/2021    3:47 AM  BMP  Glucose 70 - 99 mg/dL 112   90   113    BUN 8 - 23 mg/dL '16   14   11    '$ Creatinine 0.44 -  1.00 mg/dL 0.85   0.70   0.69    Sodium 135 - 145 mmol/L 139   139   140    Potassium 3.5 - 5.1 mmol/L 3.5   4.0   3.7    Chloride 98 - 111 mmol/L 103   101   105    CO2 22 - 32 mmol/L '27   31   28    '$ Calcium 8.9 - 10.3 mg/dL 9.8   9.6   9.0     Reviewed chart prior to disease state call. Spoke with patient regarding BP  Current antihypertensive regimen:  Hydralazine 50 mg BID Spironolactone 25 mg Daily Torsemide 20 mg Daily Carvedilol 12.5 mg BID  How often are you checking your Blood Pressure? daily, but if she is not feeling good she will take twice daily. Patient states that when she gets up in the morning sometimes she is dizzy and will check her blood pressure.  Current home BP readings: Patient states that he blood pressure  was 137/65 this  morning and pulse was 69  What recent interventions/DTPs have been made by any provider to improve Blood Pressure control since last CPP Visit: None noted  Any recent hospitalizations or ED visits since last visit with CPP? No  What diet changes have been made to improve Blood Pressure Control?  Patient states that she eats plenty of fruits and vegetables. Does not eat a lot of meat except chicken  What exercise is being done to improve your Blood Pressure Control?  Patient states that she is active around the house with cleaning up, and works in her garden  Adherence Review: Is the patient currently on ACE/ARB medication? No Does the patient have >5 day gap between last estimated fill dates? No    Care Gaps: Colonoscopy-NA Diabetic Foot Exam-NA Mammogram-NA Ophthalmology-NA Dexa Scan - NA Annual Well Visit - 09/19/21 Micro albumin-NA Hemoglobin A1c-09/19/21   Star Rating Drugs: None ID   Ethelene Hal Clinical Pharmacist Assistant [phone]

## 2022-04-27 ENCOUNTER — Other Ambulatory Visit: Payer: Self-pay | Admitting: Internal Medicine

## 2022-04-27 DIAGNOSIS — F411 Generalized anxiety disorder: Secondary | ICD-10-CM

## 2022-04-30 ENCOUNTER — Other Ambulatory Visit: Payer: Self-pay | Admitting: Internal Medicine

## 2022-04-30 DIAGNOSIS — I5032 Chronic diastolic (congestive) heart failure: Secondary | ICD-10-CM

## 2022-04-30 DIAGNOSIS — I10 Essential (primary) hypertension: Secondary | ICD-10-CM

## 2022-05-07 ENCOUNTER — Encounter: Payer: Self-pay | Admitting: Pulmonary Disease

## 2022-05-07 ENCOUNTER — Ambulatory Visit: Payer: Medicare HMO | Admitting: Pulmonary Disease

## 2022-05-07 VITALS — BP 120/68 | HR 86 | Temp 98.0°F | Ht 62.0 in | Wt 200.6 lb

## 2022-05-07 DIAGNOSIS — R0602 Shortness of breath: Secondary | ICD-10-CM

## 2022-05-07 DIAGNOSIS — Z9989 Dependence on other enabling machines and devices: Secondary | ICD-10-CM

## 2022-05-07 DIAGNOSIS — G4733 Obstructive sleep apnea (adult) (pediatric): Secondary | ICD-10-CM

## 2022-05-07 NOTE — Patient Instructions (Addendum)
Ambulatory oxygen testing  Continue using CPAP on a nightly basis  Call us with significant concerns  Follow-up in 6  months

## 2022-05-07 NOTE — Progress Notes (Signed)
Subjective:    Patient ID: [name], female    DOB: 30-Nov-1932, 88 y.o.   MRN: [id number]  Patient being seen for history of obstructive sleep apnea  Has been compliant with CPAP use Appears to be tolerating CPAP well  She feels she sleeps well with CPAP She is continuing to get used to using CPAP on a nightly basis  Diagnosed with obstructive sleep apnea in 2013  She does get short of breath with mild to moderate exertion Feels she needs oxygen supplementation We will do an ambulatory oximetry testing today  Goes to bed about 10-11 PM Takes about 35 minutes to 40 minutes to fall asleep  Wakes up in the morning about 5 AM, takes her few hours to finally get out of bed sometimes up until 10 AM     Past Medical History:  Diagnosis Date   Abdominal pain    Anemia    NOS   Anxiety    Back pain    Bruises easily    Chronic diastolic CHF (congestive heart failure) ([place])    a. 06/2013 EF 65-70%.   Chronic headaches    HISTORY OF    Depression    Diabetes mellitus    type II  DIET CONTROLLED   Diarrhea    Hepatitis    AGE 30S   Hyperlipidemia    Hypertension    Mild aortic stenosis    a. 06/2013 Echo: EF 65-70%, mod LVH with focal basal hypertrophy, very mild AS.   Osteoarthritis    Oxygen desaturation during sleep    USES 2 LITERS BEDTIME   VIA CPAP 06/2012 [facility]    Shortness of breath    WITH EXERTION USES 2 L O2 BEDTIME   Sleep apnea    CPAP WITH O2 2 LITERS 2013 ([facility])   Wears dentures    Wears glasses    Family History  Problem Relation Age of Onset   Cancer Father        prostate cancer   Heart disease Sister    Heart disease Brother    Alcohol abuse Other    Hypertension Other    Kidney disease Other    Mental illness Other    Emphysema Brother    Social History   Socioeconomic History   Marital status: Widowed    Spouse name: Not on file   Number of children: 5   Years of education: Not on file   Highest education level: Not on  file  Occupational History   Occupation: retired     Fish farm manager: RETIRED    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 Use   Smoking status: Never   Smokeless tobacco: Never  Vaping Use   Vaping Use: Never used  Substance and Sexual Activity   Alcohol use: No   Drug use: No   Sexual activity: Never  Other Topics Concern   Not on file  Social History Narrative   Not on file   Social Determinants of Health   Financial Resource Strain: Low Risk  (02/01/2022)   Overall Financial Resource Strain (CARDIA)    Difficulty of Paying Living Expenses: Not hard at all  Food Insecurity: No Food Insecurity (04/01/2022)   Hunger Vital Sign    Worried About Running Out of Food in the Last Year: Never true    [place] in the Last Year: Never true  Transportation Needs: No Transportation Needs (04/01/2022)   [place] - Transportation  Lack of Transportation (Medical): No    Lack of Transportation (Non-Medical): No  Physical Activity: Inactive (02/01/2022)   Exercise Vital Sign    Days of Exercise per Week: 0 days    Minutes of Exercise per Session: 0 min  Stress: No Stress Concern Present (02/01/2022)   [place]    Feeling of Stress : Not at all  Social Connections: Socially Isolated (02/01/2022)   Social Connection and Isolation Panel [NHANES]    Frequency of Communication with Friends and Family: Three times a week    Frequency of Social Gatherings with Friends and Family: Three times a week    Attends Religious Services: Never    Active Member of Clubs or Organizations: No    Attends Archivist Meetings: Never    Marital Status: Widowed  Intimate Partner Violence: Not At Risk (02/01/2022)   Humiliation, Afraid, Rape, and Kick questionnaire    Fear of Current or Ex-Partner: No    Emotionally Abused: No    Physically Abused: No    Sexually Abused: No   Review of Systems  Constitutional:  Negative for fever and  unexpected weight change.  HENT:  Positive for congestion and trouble swallowing. Negative for dental problem, ear pain, nosebleeds, postnasal drip, rhinorrhea, sinus pressure, sneezing and sore throat.   Eyes:  Negative for redness and itching.  Respiratory:  Positive for shortness of breath. Negative for cough, chest tightness and wheezing.   Cardiovascular:  Positive for leg swelling. Negative for palpitations.  Gastrointestinal:  Negative for nausea and vomiting.  Genitourinary:  Negative for dysuria.  Musculoskeletal:  Negative for joint swelling.  Skin:  Negative for rash.  Allergic/Immunologic: Negative.  Negative for environmental allergies, food allergies and immunocompromised state.  Neurological:  Negative for headaches.  Hematological:  Bruises/bleeds easily.  Psychiatric/Behavioral:  Negative for dysphoric mood. The patient is nervous/anxious.        Objective:   Physical Exam Constitutional:      Appearance: She is obese.  HENT:     Head: Normocephalic and atraumatic.     Nose: No congestion.  Eyes:     Pupils: Pupils are equal, round, and reactive to light.  Cardiovascular:     Rate and Rhythm: Normal rate and regular rhythm.  Pulmonary:     Effort: Pulmonary effort is normal. No respiratory distress.     Breath sounds: Normal breath sounds. No stridor. No wheezing or rhonchi.  Musculoskeletal:     Cervical back: No rigidity or tenderness.  Skin:    Coloration: Skin is not jaundiced.  Neurological:     Mental Status: She is alert.  Psychiatric:        Mood and Affect: Mood normal.    There were no vitals filed for this visit.  CPAP compliance reviewed showing 100% compliance Average use of 5 hours 38 minutes CPAP set between 5 and 15 Residual AHI 1.3  Assessment & Plan:  .  Nonrestorative sleep .  Daytime sleepiness .  Nocturnal desaturations .  Obstructive sleep apnea  -Encouraged to continue using CPAP on a nightly basis  -For shortness of  breath with activity  -Ambulatory oximetry testing to see whether she qualifies for oxygen supplementation  -Follow-up in 6 months  -call with significant symptoms

## 2022-05-10 ENCOUNTER — Other Ambulatory Visit: Payer: Self-pay | Admitting: Internal Medicine

## 2022-05-10 ENCOUNTER — Telehealth: Payer: Self-pay | Admitting: Internal Medicine

## 2022-05-10 DIAGNOSIS — M159 Polyosteoarthritis, unspecified: Secondary | ICD-10-CM

## 2022-05-10 DIAGNOSIS — Z515 Encounter for palliative care: Secondary | ICD-10-CM

## 2022-05-10 DIAGNOSIS — G894 Chronic pain syndrome: Secondary | ICD-10-CM

## 2022-05-10 MED ORDER — HYDROCODONE-ACETAMINOPHEN 10-325 MG PO TABS: 1.0000 | ORAL_TABLET | Freq: Four times a day (QID) | ORAL | 0 refills | Status: DC | PRN

## 2022-05-10 NOTE — Telephone Encounter (Signed)
Pt called requesting a refill on  HYDROcodone-acetaminophen [facility]) 10-325 MG tablet  Pharmacy [facility] [phone] - [place], [address] AT [address] Phone:  [phone]  Fax:  [phone]

## 2022-05-13 ENCOUNTER — Other Ambulatory Visit: Payer: Self-pay | Admitting: Internal Medicine

## 2022-05-13 DIAGNOSIS — I10 Essential (primary) hypertension: Secondary | ICD-10-CM

## 2022-05-14 ENCOUNTER — Telehealth: Payer: Self-pay | Admitting: Internal Medicine

## 2022-05-14 NOTE — Telephone Encounter (Signed)
Pt called in requesting a new anxiety rx. She is currently on ClonazePAM and she said it contains Benzodiazepine and she said that is making her dizzy. She would like an rx that does not contain Benzodiazepine.  Please advise.

## 2022-05-15 ENCOUNTER — Other Ambulatory Visit: Payer: Self-pay | Admitting: Internal Medicine

## 2022-05-15 DIAGNOSIS — F411 Generalized anxiety disorder: Secondary | ICD-10-CM

## 2022-05-15 MED ORDER — HYDROXYZINE HCL 10 MG PO TABS: 10.0000 mg | ORAL_TABLET | Freq: Three times a day (TID) | ORAL | 0 refills | Status: DC | PRN

## 2022-05-20 NOTE — Progress Notes (Signed)
[facility] Note  05/22/2022     CHIEF COMPLAINT Patient presents for Retina Evaluation   HISTORY OF PRESENT ILLNESS: [name] is a 88 y.o. female who presents to the clinic today for:   HPI     Retina Evaluation   In right eye.  I, the attending physician,  performed the HPI with the patient and updated documentation appropriately.        Comments   Retina eval per Dr. Lucianne Lei for subretinal scar OD- Patient thinks her OS is having the problem. Seems like a film is over OS per patient.      Last edited by Bernarda Caffey, MD on 05/22/2022 11:30 PM.    Pt is here on the referral of Dr. Lucianne Lei for concern of subretinal scar OD, pt states she has HTN, but her heart dr tries to keep her BP very low, bc her heart "doesn't relax", so the lower her BP, the better she can breathe, pt feels like she has a "skim" over her eye, pt has had cataract sx over 10 years ago  Referring physician: Lisabeth Pick, MD [place],  [place] [address]  HISTORICAL INFORMATION:   Selected notes from the MEDICAL RECORD NUMBER Referred by Dr. Lucianne Lei LEE:  Ocular Hx- PMH-    CURRENT MEDICATIONS: No current outpatient medications on file. (Ophthalmic Drugs)   No current facility-administered medications for this visit. (Ophthalmic Drugs)   Current Outpatient Medications (Other)  Medication Sig   albuterol (PROVENTIL) (2.5 MG/3ML) 0.083% nebulizer solution INHALE THE CONTENTS OF 1 VIAL VIA NEBULIZER TWICE DAILY AS NEEDED FOR WHEEZING OR SHORTNESS OF BREATH   carvedilol (COREG) 12.5 MG tablet TAKE 1 TABLET(12.5 MG) BY MOUTH TWICE DAILY WITH A MEAL   Cholecalciferol 2000 units TABS Take 2 tablets (4,000 Units total) by mouth daily.   hydrALAZINE (APRESOLINE) 50 MG tablet TAKE 1 TABLET(50 MG) BY MOUTH TWICE DAILY   HYDROcodone-acetaminophen (NORCO) 10-325 MG tablet Take 1 tablet by mouth every 6 (six) hours as needed.   hydrOXYzine (ATARAX) 10 MG tablet Take 1  tablet (10 mg total) by mouth every 8 (eight) hours as needed for anxiety.   omeprazole (PRILOSEC) 40 MG capsule TAKE 1 CAPSULE(40 MG) BY MOUTH DAILY   Plecanatide (TRULANCE) 3 MG TABS Take 1 tablet by mouth daily.   promethazine (PHENERGAN) 12.5 MG tablet TAKE 1 TABLET(12.5 MG) BY MOUTH EVERY 8 HOURS AS NEEDED FOR NAUSEA OR VOMITING   spironolactone (ALDACTONE) 25 MG tablet TAKE 1/2 TABLET(12.5 MG) BY MOUTH DAILY   tiZANidine (ZANAFLEX) 4 MG tablet Take 1 tablet (4 mg total) by mouth every 8 (eight) hours as needed for muscle spasms.   torsemide (DEMADEX) 20 MG tablet TAKE 1 TABLET BY MOUTH EVERY DAY   traZODone (DESYREL) 100 MG tablet TAKE 1 TABLET(100 MG) BY MOUTH AT BEDTIME   trolamine salicylate (ASPERCREME) 10 % cream Apply 1 application. topically as needed for muscle pain.   Vibegron (GEMTESA) 75 MG TABS Take 75 mg by mouth daily.   No current facility-administered medications for this visit. (Other)   REVIEW OF SYSTEMS: ROS   Positive for: Gastrointestinal, Eyes, Respiratory, Psychiatric Negative for: Constitutional, Neurological, Skin, Genitourinary, Musculoskeletal, HENT, Endocrine, Cardiovascular, Allergic/Imm, Heme/Lymph Last edited by Leonie Douglas, COA on 05/22/2022  2:02 PM.     ALLERGIES Allergies  Allergen Reactions   Amlodipine Other (See Comments)    Reaction:  Dizziness    Enalapril Cough   Losartan Potassium  Other (See Comments)    Reaction:  GI pain    Metformin Diarrhea   Morphine And Related Itching and Nausea And Vomiting   PAST MEDICAL HISTORY Past Medical History:  Diagnosis Date   Abdominal pain    Anemia    NOS   Anxiety    Back pain    Bruises easily    Chronic diastolic CHF (congestive heart failure) ([place])    a. 06/2013 EF 65-70%.   Chronic headaches    HISTORY OF    Depression    Diabetes mellitus    type II  DIET CONTROLLED   Diarrhea    Hepatitis    AGE 30S   Hyperlipidemia    Hypertension    Mild aortic stenosis    a. 06/2013 Echo:  EF 65-70%, mod LVH with focal basal hypertrophy, very mild AS.   Osteoarthritis    Oxygen desaturation during sleep    USES 2 LITERS BEDTIME   VIA CPAP 06/2012 [facility]    Shortness of breath    WITH EXERTION USES 2 L O2 BEDTIME   Sleep apnea    CPAP WITH O2 2 LITERS 2013 ([facility])   Wears dentures    Wears glasses    Past Surgical History:  Procedure Laterality Date   ABDOMINAL HYSTERECTOMY     APPENDECTOMY     CHOLECYSTECTOMY     JOINT REPLACEMENT     LEFT KNEE    KNEE ARTHROPLASTY Right 03/22/2015   Procedure: COMPUTER ASSISTED TOTAL KNEE ARTHROPLASTY;  Surgeon: Marybelle Killings, MD;  Location: [facility];  Service: Orthopedics;  Laterality: Right;   KNEE ARTHROSCOPY     RIGHT   LEFT AND RIGHT HEART CATHETERIZATION WITH CORONARY ANGIOGRAM N/A 10/28/2013   Procedure: LEFT AND RIGHT HEART CATHETERIZATION WITH CORONARY ANGIOGRAM;  Surgeon: Blane Ohara, MD;  Location: [facility] C[facility] LAB;  Service: Cardiovascular;  Laterality: N/A;   LEFT HEART CATHETERIZATION WITH CORONARY ANGIOGRAM N/A 11/14/2014   Procedure: LEFT HEART CATHETERIZATION WITH CORONARY ANGIOGRAM;  Surgeon: Josue Hector, MD;  Location: [facility] CATH LAB;  Service: Cardiovascular;  Laterality: N/A;   REVISION TOTAL KNEE ARTHROPLASTY  2011   ROTATOR CUFF REPAIR  2010   SHOULDER ARTHROSCOPY  09/17/2012   Procedure: ARTHROSCOPY SHOULDER;  Surgeon: Sharmon Revere, MD;  Location: [facility];  Service: Orthopedics;  Laterality: Left;   TONSILLECTOMY     TONSILLECTOMY     FAMILY HISTORY Family History  Problem Relation Age of Onset   Cancer Father        prostate cancer   Heart disease Sister    Heart disease Brother    Alcohol abuse Other    Hypertension Other    Kidney disease Other    Mental illness Other    Emphysema Brother     SOCIAL HISTORY Social History   Tobacco Use   Smoking status: Never   Smokeless tobacco: Never  Vaping Use   Vaping Use: Never used  Substance Use Topics   Alcohol use: No   Drug use: No          OPHTHALMIC EXAM:  Base Eye Exam     Visual Acuity (Snellen - Linear)       Right Left   Dist cc 20/25 20/20 -1   Dist ph cc NI     Correction: Glasses         Tonometry (Tonopen, 2:14 PM)       Right Left   Pressure 13 15  Pupils       Dark Light Shape React APD   Right 2 1 Round Brisk None   Left 2 1 Round Brisk None         Visual Fields (Counting fingers)       Left Right    Full Full         Extraocular Movement       Right Left    Full Full         Neuro/Psych     Oriented x3: Yes   Mood/Affect: Normal         Dilation     Both eyes: 1.0% Mydriacyl, 2.5% Phenylephrine @ 2:14 PM           Slit Lamp and Fundus Exam     External Exam       Right Left   External Normal Normal         Slit Lamp Exam       Right Left   Lids/Lashes Dermatochalasis - upper lid Dermatochalasis - upper lid   Conjunctiva/Sclera mild melanosis mild melanosis   Cornea trace PEE, well healed cataract wound Arcus, mild mucus, trace PEE   Anterior Chamber deep and clear deep and clear   Iris Round and dilated, No NVI Round and dilated, No NVI   Lens 3 piece PC IOL in good position 3 piece PC IOL in good position   Anterior Vitreous Normal Normal         Fundus Exam       Right Left   Disc mild Pallor, Sharp rim mild Pallor, Sharp rim, temporal PPA, +SVP   C/D Ratio 0.2 0.3   Macula Flat, Good foveal reflex, RPE mottling, old subretinal heme and pigmentation superior macula along ST arcades Flat, Good foveal reflex, RPE mottling, No heme or edema   Vessels attenuated, copper wiring, Tortuous, AV crossing changes, white RAM along ST arcades with surrounding atrophy and pigment clumping attenuated, Tortuous, mild copper wiring   Periphery Attached, focal atrophy around ST RAM, peripheral drusen, no heme Attached, mild midzonal drusen greatest temporally, No heme           Refraction     Wearing Rx       Sphere Cylinder Axis  Add   Right -1.00 +1.25 005 +2.50   Left -3.00 +1.50 003 +2.50         Manifest Refraction       Sphere Cylinder Axis Dist VA   Right -1.00 +1.25 005 NI   Left                IMAGING AND PROCEDURES  Imaging and Procedures for 05/22/2022  OCT, Retina - OU - Both Eyes       Right Eye Quality was good. Central Foveal Thickness: 260. Progression has no prior data. Findings include normal foveal contour, no IRF, no SRF, subretinal hyper-reflective material, inner retinal atrophy, outer retinal atrophy, vitreomacular adhesion (Focal SRHM/IRHM superior macula along ST arcades).   Left Eye Quality was good. Central Foveal Thickness: 260. Progression has no prior data. Findings include normal foveal contour, no IRF, no SRF, vitreomacular adhesion .   Notes *Images captured and stored on drive  Diagnosis / Impression:  OD: Focal SRHM/IRHM superior macula along ST arcades OS: NFP; no IRF/SRF; +VMA  Clinical management:  See below  Abbreviations: NFP - Normal foveal profile. CME - cystoid macular edema. PED - pigment epithelial detachment. IRF - intraretinal fluid. SRF - subretinal  fluid. EZ - ellipsoid zone. ERM - epiretinal membrane. ORA - outer retinal atrophy. ORT - outer retinal tubulation. SRHM - subretinal hyper-reflective material. IRHM - intraretinal hyper-reflective material      Fluorescein Angiography Optos (Transit OD)       Right Eye Progression has no prior data. Early phase findings include blockage, staining. Mid/Late phase findings include blockage, staining (Focal blockage along ST arcades -- good distal flow, mild staining surrounding area of blockage -- old SRH, no leakage).   Left Eye Progression has no prior data. Early phase findings include normal observations. Mid/Late phase findings include normal observations.   Notes **Images stored on drive**  Impression: OD: Focal blockage along ST arcades -- good distal flow, mild staining surrounding area  of blockage -- old SRH -- old macroanuerysm OS: normal study            ASSESSMENT/PLAN:    ICD-10-CM   1. Retinal macroaneurysm of right eye  H35.011 OCT, Retina - OU - Both Eyes    Fluorescein Angiography Optos (Transit OD)    2. Subretinal hemorrhage of right eye  H35.61 OCT, Retina - OU - Both Eyes    Fluorescein Angiography Optos (Transit OD)    3. Essential hypertension  I10     4. Hypertensive retinopathy of both eyes  H35.033 Fluorescein Angiography Optos (Transit OD)    5. Diabetes mellitus type 2 without retinopathy ([place])  E11.9     6. Pseudophakia, both eyes  Z96.1       1,2. Old subretinal hemorrhage from retinal macroaneurysm OD  - RAM along ST arcades w/ surrounding old [place] (white), pigment clumping and atrophy  - pt reports elevated BP and hospitalization June 2022 -- SBP 190s-218  - suspect RAM w/ SRH related to severely elevated BP  - pt asymptomatic with BCVA OD 20/25  - exam shows white SRH almost resolved -- no active red heme  - FA 6.28.23 -- no leakage to suggest active RAM  - no retinal or ophthalmic interventions indicated or recommended   - monitor  - f/u 3 months  3,4. Hypertensive retinopathy OU - discussed importance of tight BP control and likely relation to #1,2 above - monitor  5. Diabetes mellitus, type 2 without retinopathy  - A1c 4.8 on 10.26.22 - The incidence, risk factors for progression, natural history and treatment options for diabetic retinopathy  were discussed with patient.   - The need for close monitoring of blood glucose, blood pressure, and serum lipids, avoiding cigarette or any type of tobacco, and the need for long term follow up was also discussed with patient. - f/u in 1 year, sooner prn  6. Pseudophakia OU  - s/p CE/IOL OU  - IOLs in good position, doing well  - monitor  Ophthalmic Meds Ordered this visit:  No orders of the defined types were placed in this encounter.    Return in about 3 months (around  08/22/2022) for f/u 3 months, RAM OD, DFE, OCT.  There are no Patient Instructions on file for this visit.   Explained the diagnoses, plan, and follow up with the patient and they expressed understanding.  Patient expressed understanding of the importance of proper follow up care.   This document serves as a record of services personally performed by Gardiner Sleeper, MD, PhD. It was created on their behalf by Orvan Falconer, an ophthalmic technician. The creation of this record is the provider's dictation and/or activities during the visit.    Electronically  signed by: Orvan Falconer, OA, 05/22/22  11:34 PM  This document serves as a record of services personally performed by Gardiner Sleeper, MD, PhD. It was created on their behalf by San Jetty. Owens Shark, OA an ophthalmic technician. The creation of this record is the provider's dictation and/or activities during the visit.    Electronically signed by: San Jetty. Marguerita Merles 06.28.2023 11:34 PM  Gardiner Sleeper, M.D., Ph.D. Diseases & Surgery of the Retina and Vitreous [facility]  I have reviewed the above documentation for accuracy and completeness, and I agree with the above. Gardiner Sleeper, M.D., Ph.D. 05/22/22 11:54 PM   Abbreviations: M myopia (nearsighted); A astigmatism; H hyperopia (farsighted); P presbyopia; Mrx spectacle prescription;  CTL contact lenses; OD right eye; OS left eye; OU both eyes  XT exotropia; ET esotropia; PEK punctate epithelial keratitis; PEE punctate epithelial erosions; DES dry eye syndrome; MGD meibomian gland dysfunction; ATs artificial tears; PFAT's preservative free artificial tears; [place] nuclear sclerotic cataract; PSC posterior subcapsular cataract; ERM epi-retinal membrane; PVD posterior vitreous detachment; RD retinal detachment; DM diabetes mellitus; DR diabetic retinopathy; NPDR non-proliferative diabetic retinopathy; PDR proliferative diabetic retinopathy; CSME clinically  significant macular edema; DME diabetic macular edema; dbh dot blot hemorrhages; CWS cotton wool spot; POAG primary open angle glaucoma; C/D cup-to-disc ratio; HVF humphrey visual field; GVF goldmann visual field; OCT optical coherence tomography; IOP intraocular pressure; BRVO Branch retinal vein occlusion; CRVO central retinal vein occlusion; CRAO central retinal artery occlusion; BRAO branch retinal artery occlusion; RT retinal tear; SB scleral buckle; PPV pars plana vitrectomy; VH Vitreous hemorrhage; PRP panretinal laser photocoagulation; IVK intravitreal kenalog; VMT vitreomacular traction; MH Macular hole;  NVD neovascularization of the disc; NVE neovascularization elsewhere; AREDS age related eye disease study; ARMD age related macular degeneration; POAG primary open angle glaucoma; EBMD epithelial/anterior basement membrane dystrophy; ACIOL anterior chamber intraocular lens; IOL intraocular lens; PCIOL posterior chamber intraocular lens; Phaco/IOL phacoemulsification with intraocular lens placement; [place] photorefractive keratectomy; LASIK laser assisted in situ keratomileusis; HTN hypertension; DM diabetes mellitus; COPD chronic obstructive pulmonary disease

## 2022-05-22 ENCOUNTER — Encounter (INDEPENDENT_AMBULATORY_CARE_PROVIDER_SITE_OTHER): Payer: Self-pay | Admitting: Ophthalmology

## 2022-05-22 ENCOUNTER — Ambulatory Visit (INDEPENDENT_AMBULATORY_CARE_PROVIDER_SITE_OTHER): Payer: Medicare HMO | Admitting: Ophthalmology

## 2022-05-22 ENCOUNTER — Encounter (INDEPENDENT_AMBULATORY_CARE_PROVIDER_SITE_OTHER): Payer: Medicare HMO | Admitting: Ophthalmology

## 2022-05-22 VITALS — BP 99/60 | HR 60

## 2022-05-22 DIAGNOSIS — E119 Type 2 diabetes mellitus without complications: Secondary | ICD-10-CM

## 2022-05-22 DIAGNOSIS — H35033 Hypertensive retinopathy, bilateral: Secondary | ICD-10-CM

## 2022-05-22 DIAGNOSIS — H35011 Changes in retinal vascular appearance, right eye: Secondary | ICD-10-CM | POA: Diagnosis not present

## 2022-05-22 DIAGNOSIS — H3581 Retinal edema: Secondary | ICD-10-CM

## 2022-05-22 DIAGNOSIS — I10 Essential (primary) hypertension: Secondary | ICD-10-CM

## 2022-05-22 DIAGNOSIS — H3561 Retinal hemorrhage, right eye: Secondary | ICD-10-CM | POA: Diagnosis not present

## 2022-05-22 DIAGNOSIS — Z961 Presence of intraocular lens: Secondary | ICD-10-CM

## 2022-05-30 ENCOUNTER — Telehealth: Payer: Self-pay | Admitting: Pulmonary Disease

## 2022-05-30 NOTE — Addendum Note (Signed)
Addended by: Dessie Coma on: 05/30/2022 04:50 PM   Modules accepted: Orders

## 2022-05-30 NOTE — Telephone Encounter (Signed)
I called the patient and let her know I have placed an order for a POC for her . She voices understanding. Nothing further needed.

## 2022-06-07 NOTE — Telephone Encounter (Signed)
Caller & Relationship to patient: self  Call back SFKCLE:[phone]   Date of last office visit:05/07/22   Date of next office visit:   Medication(s) to be refilled:HYDROcodone-acetaminophen [facility]) 10-325 MG tablet         Preferred Pharmacy:  [facility] [place], [place] - [address] Phone:  [phone]  Fax:  [phone]

## 2022-06-10 ENCOUNTER — Telehealth: Payer: Self-pay | Admitting: Internal Medicine

## 2022-06-10 NOTE — Telephone Encounter (Signed)
Caller & Relationship to patient: Kerstyn Coryell  Call back number: [phone]  Date of last office visit: 03/20/22  Date of next office visit: 09/23/22  Medication(s) to be refilled:  HYDROcodone-acetaminophen [facility]) 10-325 MG tablet  Preferred Pharmacy:  [facility] [place], [place] - [address] Phone:  [phone]  Fax:  [phone]

## 2022-06-11 ENCOUNTER — Other Ambulatory Visit: Payer: Self-pay | Admitting: Internal Medicine

## 2022-06-11 DIAGNOSIS — M159 Polyosteoarthritis, unspecified: Secondary | ICD-10-CM

## 2022-06-11 DIAGNOSIS — Z515 Encounter for palliative care: Secondary | ICD-10-CM

## 2022-06-11 DIAGNOSIS — G894 Chronic pain syndrome: Secondary | ICD-10-CM

## 2022-06-11 MED ORDER — HYDROCODONE-ACETAMINOPHEN 10-325 MG PO TABS: 1.0000 | ORAL_TABLET | Freq: Four times a day (QID) | ORAL | 0 refills | Status: DC | PRN

## 2022-06-11 NOTE — Telephone Encounter (Signed)
Pt is calling in for an update on the medication refill crying stating that she is hurting real bad and that she didn't get no rest.   Refill Request HYDROcodone-acetaminophen (NORCO) 10-325 MG tablet  Pharmacy: [facility] [place], [place] AT [address]  LOV4/26/23 ROV 09/23/22

## 2022-06-12 ENCOUNTER — Other Ambulatory Visit: Payer: Self-pay | Admitting: Internal Medicine

## 2022-06-12 ENCOUNTER — Other Ambulatory Visit: Payer: Self-pay | Admitting: Cardiology

## 2022-06-12 DIAGNOSIS — G4710 Hypersomnia, unspecified: Secondary | ICD-10-CM

## 2022-06-12 DIAGNOSIS — G4733 Obstructive sleep apnea (adult) (pediatric): Secondary | ICD-10-CM

## 2022-06-13 NOTE — Telephone Encounter (Signed)
Rx request sent to pharmacy.  

## 2022-07-01 ENCOUNTER — Telehealth: Payer: Medicare HMO

## 2022-07-01 ENCOUNTER — Other Ambulatory Visit: Payer: Self-pay | Admitting: Internal Medicine

## 2022-07-01 ENCOUNTER — Ambulatory Visit: Payer: Medicare HMO | Admitting: *Deleted

## 2022-07-01 DIAGNOSIS — G4710 Hypersomnia, unspecified: Secondary | ICD-10-CM

## 2022-07-01 DIAGNOSIS — I5032 Chronic diastolic (congestive) heart failure: Secondary | ICD-10-CM

## 2022-07-01 DIAGNOSIS — I10 Essential (primary) hypertension: Secondary | ICD-10-CM

## 2022-07-01 MED ORDER — MODAFINIL 100 MG PO TABS: 100.0000 mg | ORAL_TABLET | Freq: Every day | ORAL | 0 refills | Status: DC

## 2022-07-01 NOTE — Chronic Care Management (AMB) (Signed)
Care Management    RN Visit Note  07/01/2022 Name: [name] MRN: [id number] DOB: 1933-07-24  Subjective: [name] is a 88 y.o. year old female who is a primary care patient of Etta Grandchild, MD. The care management team was consulted for assistance with disease management and care coordination needs.    Engaged with patient by telephone for follow up visit/ RN CM case closure in response to provider referral for case management and/or care coordination services.   Consent to Services:   [name] was given information about Care Management services 01/18/20 including:  Care Management services includes personalized support from designated clinical staff supervised by her physician, including individualized plan of care and coordination with other care providers 24/7 contact phone numbers for assistance for urgent and routine care needs. The patient may stop case management services at any time by phone call to the office staff.  Patient agreed to services and consent obtained.   Assessment: Review of patient past medical history, allergies, medications, health status, including review of consultants reports, laboratory and other test data, was performed as part of comprehensive evaluation and provision of chronic care management services.   SDOH (Social Determinants of Health) assessments and interventions performed:  SDOH Interventions    Flowsheet Row Most Recent Value  SDOH Interventions   Food Insecurity Interventions Intervention Not Indicated  [continunes to deny food insecurity]  Transportation Interventions Intervention Not Indicated  [continues driving self short distances,  family/ friends assist as indicated]     Care Plan  Allergies  Allergen Reactions   Amlodipine Other (See Comments)    Reaction:  Dizziness    Enalapril Cough   Losartan Potassium Other (See Comments)    Reaction:  GI pain    Metformin Diarrhea   Morphine And Related Itching and Nausea  And Vomiting   Outpatient Encounter Medications as of 07/01/2022  Medication Sig   albuterol (PROVENTIL) (2.5 MG/3ML) 0.083% nebulizer solution INHALE THE CONTENTS OF 1 VIAL VIA NEBULIZER TWICE DAILY AS NEEDED FOR WHEEZING OR SHORTNESS OF BREATH   carvedilol (COREG) 12.5 MG tablet TAKE 1 TABLET(12.5 MG) BY MOUTH TWICE DAILY WITH A MEAL   Cholecalciferol 2000 units TABS Take 2 tablets (4,000 Units total) by mouth daily.   hydrALAZINE (APRESOLINE) 50 MG tablet TAKE 1 TABLET(50 MG) BY MOUTH TWICE DAILY   HYDROcodone-acetaminophen (NORCO) 10-325 MG tablet Take 1 tablet by mouth every 6 (six) hours as needed.   hydrOXYzine (ATARAX) 10 MG tablet Take 1 tablet (10 mg total) by mouth every 8 (eight) hours as needed for anxiety.   omeprazole (PRILOSEC) 40 MG capsule TAKE 1 CAPSULE(40 MG) BY MOUTH DAILY   Plecanatide (TRULANCE) 3 MG TABS Take 1 tablet by mouth daily.   promethazine (PHENERGAN) 12.5 MG tablet TAKE 1 TABLET(12.5 MG) BY MOUTH EVERY 8 HOURS AS NEEDED FOR NAUSEA OR VOMITING   spironolactone (ALDACTONE) 25 MG tablet TAKE 1/2 TABLET(12.5 MG) BY MOUTH DAILY   tiZANidine (ZANAFLEX) 4 MG tablet Take 1 tablet (4 mg total) by mouth every 8 (eight) hours as needed for muscle spasms.   torsemide (DEMADEX) 20 MG tablet TAKE 1 TABLET BY MOUTH EVERY DAY   traZODone (DESYREL) 100 MG tablet TAKE 1 TABLET(100 MG) BY MOUTH AT BEDTIME   trolamine salicylate (ASPERCREME) 10 % cream Apply 1 application. topically as needed for muscle pain.   Vibegron (GEMTESA) 75 MG TABS Take 75 mg by mouth daily.   No facility-administered encounter medications on file as  of 07/01/2022.   Patient Active Problem List   Diagnosis Date Noted   Therapeutic opioid-induced constipation (OIC) 03/20/2022   Encounter for palliative care involving management of pain 09/20/2020   Excessive somnolence disorder 04/12/2020   Routine general medical examination at a health care facility 04/10/2020   Anxiety 08/26/2018   Seizure ([place])  08/25/2018   GERD (gastroesophageal reflux disease) 08/25/2018   Chronic diastolic CHF (congestive heart failure) ([place]) 08/25/2018   Major depressive disorder, recurrent episode with anxious distress ([place]) 02/04/2017   Dementia arising in the senium and presenium ([place]) 06/27/2016   Obesity, Class II, BMI 35-39.9, with comorbidity 05/15/2016   Insomnia w/ sleep apnea 10/16/2015   IBS (irritable bowel syndrome) 02/27/2015   Hashimoto's thyroiditis 05/05/2014   Adrenal incidentaloma ([place]) 11/04/2013   Benign paroxysmal positional vertigo 07/15/2013   Aortic stenosis, mild 07/14/2013   OAB (overactive bladder) 02/24/2013   GERD with stricture 12/24/2012   Sleep-related hypoventilation 07/30/2012   OSA (obstructive sleep apnea) 04/16/2012   Osteopenia 04/16/2012   Vitamin D deficiency 12/04/2010   Hyperlipidemia with target LDL less than 130 12/03/2010   GAD (generalized anxiety disorder) 12/03/2010   Essential hypertension 12/03/2010   Osteoarthritis 12/03/2010   Conditions to be addressed/monitored: CHF and HTN  Care Plan : CCM RN CM Plan of Care  Updates made by Knox Royalty, RN since 07/01/2022 12:00 AM     Problem: Chronic Disease Management Needs   Priority: High     Long-Range Goal: Ongoing adherence to established plan of care for long term chronic disease management   Start Date: 01/16/2022  Expected End Date: 01/16/2023  Priority: High  Note:   Current Barriers:  Chronic Disease Management support and education needs related to CHF and HTN Recent ED visit 11/27/21- dyspnea: discharged home to self-care  RNCM Clinical Goal(s):  Patient will demonstrate ongoing health management independence as evidenced by adherence to plan of care for HTN/ CHF        through collaboration with RN Care manager, provider, and care team.   Interventions: 1:1 collaboration with primary care provider regarding development and update of comprehensive plan of care as evidenced by provider  attestation and co-signature Inter-disciplinary care team collaboration (see longitudinal plan of care) CCM RN CM Initial assessment completed 01/16/22 Review of patient status, including review of consultants reports, relevant laboratory and other test results, and medications completed SDOH updated: no new/ unmet concerns identified Pain assessment updated: reports ongoing unchanged chronic generalized and back pain- reports continues taking prescribed medications as instructed and reports "working good;" denies concerns or changes to chronic pain levels Falls assessment updated: continues to deny new/ recent falls x 12 months- continues using cane and walker as indicated/ needed;  positive reinforcement provided with encouragement to continue efforts at fall prevention; previously provided education around fall risks/ prevention reinforced Medications discussed: reports continues to independently self-manage medications; endorses adherence to taking all medications as prescribed Requests refill of Modafinil 100 mg po QD- states she originally did not think this medication would help, but "it did;" confirms she has no refills and would like to have refilled Asks that I make PCP aware that she is no longer taking hydroxyzine-- continues to take clonazepam: states "the hydroxyzine made me feel depressed and also made me talk funny- it was like I was talking much slower than I usually do;" I will update PCP as she requests Reviewed upcoming scheduled provider appointments: 08/15/22- cardiology provider; 09/23/22- PCP; patient confirms is aware of  all and has plans to attend as scheduled Discussed plans with patient for ongoing care management follow up- patient denies current care coordination/ care management needs and is agreeable to RN CM case closure today; verbalizes understanding to contact PCP or other care providers for any needs that arise in the future, and confirms he has contact information for  all care providers     Heart Failure and HTN Interventions:  (Status: 07/01/22: Goal Met.)  Long Term Goal  Wt Readings from Last 3 Encounters:  05/07/22 200 lb 9.6 oz (91 kg)  03/20/22 202 lb (91.6 kg)  03/12/22 199 lb 12.8 oz (90.6 kg)  Last practice recorded BP readings:  BP Readings from Last 3 Encounters:  05/22/22 99/60  05/07/22 120/68  03/20/22 140/82  Most recent eGFR/CrCl: No results found for: EGFR  No components found for: CRCL  Evaluation of current treatment plan related to hypertension self management and patient's adherence to plan as established by provider Advised patient, providing education and rationale, to monitor blood pressure daily and record, calling PCP for findings outside established parameters  Reviewed recent blood pressures at home: patient reports ongoing consistent values that she reports "are all normal," reports blood pressure this morning of "140/73;" Encouraged patient to continue monitoring/ recording blood pressures at home daily  Discussed importance of daily weight and advised patient to weigh and record daily Discussed the importance of keeping all appointments with provider Assessed social determinant of health barriers Confirmed patient monitors/ records daily weights at home: reports weight ranges consistently between 190-195 lbs, with a recalled weight this morning of "192.2 lbs" Reinforced previously provided education around signs/ symptoms yellow CHF zone, along with corresponding action plan for same: patient has ongoing fair baseline understanding;  Denies shortness of breath outside of baseline; "breathing seems fine as long as I use CPAP and the nebulizer" Today, she denies concerns with swelling in abdomen/ lower extremities Confirmed patient continues using CPAP qHS- reviewed recent pulmonary provider office visit 05/07/22 with patient- she verbalizes good understanding of same and denies questions, concerns Patient verbalizes plans to  contact pulmonary provider-- states provider ordered portable home O2 tank and she "still has not heard anything back;" states she needs this to be able to go out in the community and not become short of breath- she was encouraged to follow up with pulmonary provider for an update Reinforced previously provided education around role of diuretics in setting of CHF with need to balance prevention of fluid overload without becoming dehydrated: she denies concerns around dehydration today/ recently     Plan:  No further follow up required: patient denies current care coordination/ care management needs and is agreeable to RN CM case closure today; verbalizes understanding to contact PCP or other care providers for any needs that arise in the future, and confirms he has contact information for all care providers     Oneta Rack, RN, BSN, [place] [phone]: direct office

## 2022-07-02 ENCOUNTER — Telehealth: Payer: Self-pay | Admitting: Pulmonary Disease

## 2022-07-03 NOTE — Telephone Encounter (Signed)
Danielle from [employer] stated that she has spoken to patient in regards to this. Patient has been with [employer] for too long and can not get a pOC if she would like a POC she would have to pay out of pocket for it. Nothing further needed

## 2022-07-08 ENCOUNTER — Telehealth: Payer: Self-pay | Admitting: Internal Medicine

## 2022-07-08 ENCOUNTER — Other Ambulatory Visit: Payer: Self-pay | Admitting: Internal Medicine

## 2022-07-08 DIAGNOSIS — G894 Chronic pain syndrome: Secondary | ICD-10-CM

## 2022-07-08 DIAGNOSIS — M159 Polyosteoarthritis, unspecified: Secondary | ICD-10-CM

## 2022-07-08 DIAGNOSIS — Z515 Encounter for palliative care: Secondary | ICD-10-CM

## 2022-07-08 MED ORDER — HYDROCODONE-ACETAMINOPHEN 10-325 MG PO TABS: 1.0000 | ORAL_TABLET | Freq: Four times a day (QID) | ORAL | 0 refills | Status: DC | PRN

## 2022-07-08 NOTE — Telephone Encounter (Signed)
Caller & Relationship to patient: Blondina Coderre  Call back number: [phone]  Date of last office visit: 03/20/22  Date of next office visit: 09/23/22  Medication(s) to be refilled:  HYDROcodone-acetaminophen [facility]) 10-325 MG tablet  Preferred Pharmacy:  [facility] [place], [place] - [address] Phone:  [phone]  Fax:  [phone]

## 2022-07-11 ENCOUNTER — Telehealth: Payer: Self-pay | Admitting: Pulmonary Disease

## 2022-07-12 NOTE — Telephone Encounter (Signed)
Called and spoke to [place] at [facility] and asked from someone from [employer] to call and explain to rules and regulations with [employer] and why she is not able to get her POC. I am not sure with all the rules with [employer] that's why I reached out to [employer] for someone to call and update patient. Nothing further needed

## 2022-07-19 ENCOUNTER — Telehealth: Payer: Self-pay | Admitting: Pulmonary Disease

## 2022-07-19 NOTE — Telephone Encounter (Signed)
Called and spoke with patient.  Patient stated she has spoke with Rosendo Gros with [facility] [phone] and was told reason for POC was needed for patient to receive POC.  I did let patient know per referral [employer] wasn't going to supply patient with POC, because she had O2 too long and would have to pay out of pocket.  Patient stated [employer] stated they would cover POC for patient with reason for POC need.  Patient requested POC order be sent again for [facility] approval.  Patient also requested a copy of POC order from Dr. Ander Slade be mailed to patient.  Order printed and placed in outgoing mail in sealed envelope for patient.   Message routed to [facility]

## 2022-07-22 ENCOUNTER — Telehealth: Payer: Self-pay | Admitting: Pulmonary Disease

## 2022-07-22 NOTE — Telephone Encounter (Signed)
We do not do any billing for equipment that goes thru dme.  If pt is wanting the insurance to be refiled for poc that would have to go thru [employer].  I called pt and she verified that is what she is wanting to have done.  I gave her the phone # for customer service at [facility] explained they would be the ones to do that.  Nothing further needed.

## 2022-07-25 NOTE — Telephone Encounter (Signed)
Called and spoke to pt. Pt states she was told by [employer] that the order needs to go to [facility]. Pt was a little confused with what her insurance carrier said to her regarding the DME companies. Pt would like to try going through [facility] to get a POC.   [facility], is a new order needed for her O2 or can we use the order from 05/2022? Thanks.

## 2022-07-30 ENCOUNTER — Telehealth: Payer: Self-pay | Admitting: Pulmonary Disease

## 2022-07-30 DIAGNOSIS — R0602 Shortness of breath: Secondary | ICD-10-CM

## 2022-07-30 NOTE — Telephone Encounter (Signed)
New order placed with Danielle from [employer] requirements. Nothing further needed

## 2022-07-30 NOTE — Telephone Encounter (Signed)
[facility] and [employer] is the same company.  I had talked to [place] at [employer] about this order in July and pt did not qualify for poc due to she had had the O2 too long to qualify.  She looked in her notes today and saw where pt had called them last week and told them she was going to call our office.  Andee Poles is going to call her today and try to explain again and she is also going to talk to her supervisor and see if there is anything that can be done.  She was going to call the pt when she hung up from me.  Nothing further needed.

## 2022-08-05 ENCOUNTER — Other Ambulatory Visit: Payer: Self-pay | Admitting: Internal Medicine

## 2022-08-05 ENCOUNTER — Telehealth: Payer: Self-pay | Admitting: Internal Medicine

## 2022-08-05 DIAGNOSIS — Z515 Encounter for palliative care: Secondary | ICD-10-CM

## 2022-08-05 DIAGNOSIS — G894 Chronic pain syndrome: Secondary | ICD-10-CM

## 2022-08-05 DIAGNOSIS — M159 Polyosteoarthritis, unspecified: Secondary | ICD-10-CM

## 2022-08-05 MED ORDER — HYDROCODONE-ACETAMINOPHEN 10-325 MG PO TABS: 1.0000 | ORAL_TABLET | Freq: Four times a day (QID) | ORAL | 0 refills | Status: DC | PRN

## 2022-08-05 NOTE — Telephone Encounter (Signed)
Patient needs her hydrocodone called in to [address], in [place]

## 2022-08-15 ENCOUNTER — Ambulatory Visit (HOSPITAL_BASED_OUTPATIENT_CLINIC_OR_DEPARTMENT_OTHER): Payer: Medicare HMO | Admitting: Cardiology

## 2022-08-15 ENCOUNTER — Encounter (HOSPITAL_BASED_OUTPATIENT_CLINIC_OR_DEPARTMENT_OTHER): Payer: Self-pay | Admitting: Cardiology

## 2022-08-15 VITALS — BP 130/62 | HR 59 | Ht 62.0 in | Wt 194.4 lb

## 2022-08-15 DIAGNOSIS — I350 Nonrheumatic aortic (valve) stenosis: Secondary | ICD-10-CM

## 2022-08-15 DIAGNOSIS — R0602 Shortness of breath: Secondary | ICD-10-CM

## 2022-08-15 DIAGNOSIS — G4733 Obstructive sleep apnea (adult) (pediatric): Secondary | ICD-10-CM | POA: Diagnosis not present

## 2022-08-15 DIAGNOSIS — I5032 Chronic diastolic (congestive) heart failure: Secondary | ICD-10-CM

## 2022-08-15 DIAGNOSIS — R350 Frequency of micturition: Secondary | ICD-10-CM | POA: Diagnosis not present

## 2022-08-15 DIAGNOSIS — Z9989 Dependence on other enabling machines and devices: Secondary | ICD-10-CM

## 2022-08-15 DIAGNOSIS — I10 Essential (primary) hypertension: Secondary | ICD-10-CM

## 2022-08-15 NOTE — Patient Instructions (Signed)
Medication Instructions:  Your physician recommends that you continue on your current medications as directed. Please refer to the Current Medication list given to you today.   Labwork: none  Testing/Procedures: none  Follow-Up:  02/04/2023 at 1:20 with Dr Harrell Gave

## 2022-08-15 NOTE — Progress Notes (Signed)
Cardiology Office Note:    Date:  08/15/2022   ID:  [name], DOB 1933-02-13, MRN [id number]  PCP:  Lisa Lima, MD  Cardiologist:  Lisa Dresser, MD   Referring MD: Lisa Lima, MD   CC: follow up  History of Present Illness:    [name] is a 88 y.o. female with a hx of chronic diastolic heart failure, hypertension who is seen for follow up today. I initially met her 07/16/19 as a new consult at the request of Lisa Lima, MD for the evaluation and management of aortic stenosis and concern for diastolic heart failure.  At her last appointment she was concerned regarding a left breast lump. Her at home blood pressures ranged 081 to 448 systolic.  Today: Currently she is suffering from a UTI. While traveling between healthcare offices earlier today she became short of breath. She was rushing and has not yet taken her medication today, at least since 9 AM. She uses oxygen at night when at home.   She monitors her at home blood pressures routinely. Yesterday her blood pressure was 140/70s. She denies any very high or low readings.  She believes her fluid retention is stable.  Lately she has noticed having less of an appetite. However, she does believe she is eating and drinking adequately.  She denies any palpitations, or chest pain. No lightheadedness, headaches, syncope, orthopnea, or PND.   Past Medical History:  Diagnosis Date   Abdominal pain    Anemia    NOS   Anxiety    Back pain    Bruises easily    Chronic diastolic CHF (congestive heart failure) ([place])    a. 06/2013 EF 65-70%.   Chronic headaches    HISTORY OF    Depression    Diabetes mellitus    type II  DIET CONTROLLED   Diarrhea    Hepatitis    AGE 30S   Hyperlipidemia    Hypertension    Mild aortic stenosis    a. 06/2013 Echo: EF 65-70%, mod LVH with focal basal hypertrophy, very mild AS.   Osteoarthritis    Oxygen desaturation during sleep    USES 2 LITERS BEDTIME   VIA CPAP  06/2012 [facility]    Shortness of breath    WITH EXERTION USES 2 L O2 BEDTIME   Sleep apnea    CPAP WITH O2 2 LITERS 2013 ([facility])   Wears dentures    Wears glasses     Past Surgical History:  Procedure Laterality Date   ABDOMINAL HYSTERECTOMY     APPENDECTOMY     CHOLECYSTECTOMY     JOINT REPLACEMENT     LEFT KNEE    KNEE ARTHROPLASTY Right 03/22/2015   Procedure: COMPUTER ASSISTED TOTAL KNEE ARTHROPLASTY;  Surgeon: Lisa Killings, MD;  Location: [place];  Service: Orthopedics;  Laterality: Right;   KNEE ARTHROSCOPY     RIGHT   LEFT AND RIGHT HEART CATHETERIZATION WITH CORONARY ANGIOGRAM N/A 10/28/2013   Procedure: LEFT AND RIGHT HEART CATHETERIZATION WITH CORONARY ANGIOGRAM;  Surgeon: Lisa Ohara, MD;  Location: [facility] CATH LAB;  Service: Cardiovascular;  Laterality: N/A;   LEFT HEART CATHETERIZATION WITH CORONARY ANGIOGRAM N/A 11/14/2014   Procedure: LEFT HEART CATHETERIZATION WITH CORONARY ANGIOGRAM;  Surgeon: Lisa Hector, MD;  Location: [facility] CATH LAB;  Service: Cardiovascular;  Laterality: N/A;   REVISION TOTAL KNEE ARTHROPLASTY  2011   ROTATOR CUFF REPAIR  2010   SHOULDER ARTHROSCOPY  09/17/2012   Procedure: ARTHROSCOPY SHOULDER;  Surgeon: Lisa Revere, MD;  Location: [place];  Service: Orthopedics;  Laterality: Left;   TONSILLECTOMY     TONSILLECTOMY      Current Medications: Current Outpatient Medications on File Prior to Visit  Medication Sig   albuterol (PROVENTIL) (2.5 MG/3ML) 0.083% nebulizer solution INHALE THE CONTENTS OF 1 VIAL VIA NEBULIZER TWICE DAILY AS NEEDED FOR WHEEZING OR SHORTNESS OF BREATH   carvedilol (COREG) 12.5 MG tablet TAKE 1 TABLET(12.5 MG) BY MOUTH TWICE DAILY WITH A MEAL   Cholecalciferol 2000 units TABS Take 2 tablets (4,000 Units total) by mouth daily.   clonazePAM (KLONOPIN) 1 MG tablet Take 1 mg by mouth 2 (two) times daily as needed.   hydrALAZINE (APRESOLINE) 50 MG tablet TAKE 1 TABLET(50 MG) BY MOUTH TWICE DAILY   HYDROcodone-acetaminophen  (NORCO) 10-325 MG tablet Take 1 tablet by mouth every 6 (six) hours as needed.   modafinil (PROVIGIL) 100 MG tablet Take 1 tablet (100 mg total) by mouth daily.   omeprazole (PRILOSEC) 40 MG capsule TAKE 1 CAPSULE(40 MG) BY MOUTH DAILY   promethazine (PHENERGAN) 12.5 MG tablet TAKE 1 TABLET(12.5 MG) BY MOUTH EVERY 8 HOURS AS NEEDED FOR NAUSEA OR VOMITING   spironolactone (ALDACTONE) 25 MG tablet TAKE 1/2 TABLET(12.5 MG) BY MOUTH DAILY   torsemide (DEMADEX) 20 MG tablet TAKE 1 TABLET BY MOUTH EVERY DAY   traZODone (DESYREL) 100 MG tablet TAKE 1 TABLET(100 MG) BY MOUTH AT BEDTIME   Vibegron (GEMTESA) 75 MG TABS Take 75 mg by mouth daily.   No current facility-administered medications on file prior to visit.     Allergies:   Amlodipine, Enalapril, Losartan potassium, Metformin, and Morphine and related   Social History   Tobacco Use   Smoking status: Never   Smokeless tobacco: Never  Vaping Use   Vaping Use: Never used  Substance Use Topics   Alcohol use: No   Drug use: No    Family History: The patient's family history includes Alcohol abuse in an other family member; Cancer in her father; Emphysema in her brother; Heart disease in her brother and sister; Hypertension in an other family member; Kidney disease in an other family member; Mental illness in an other family member.  ROS:   Please see the history of present illness.   (+) Shortness of breath (+) Loss of appetite Additional pertinent ROS otherwise unremarkable  EKGs/Labs/Other Studies Reviewed:    The following studies were reviewed today:  CT Abdomen 04/28/21 IMPRESSION: 1. Descending and sigmoid colon diverticulosis with mild sigmoid colon diverticulitis. 2. Scattered diverticula of the distal ileum, without inflammatory findings along the distal ileum. 3. Other imaging findings of potential clinical significance: Aortic Atherosclerosis (ICD10-I70.0). Mild cardiomegaly. Chronically stable nonspecific right  adrenal mass. Bilateral chronic hip arthropathy with chondrocalcinosis. Small bilateral groin hernias contain adipose tissue.  Echo 07/22/19  1. The left ventricle has normal systolic function with an ejection fraction of 60-65%. The cavity size was normal. Severe basal septal hypertrophy. Left ventricular diastolic Doppler parameters are consistent with pseudonormalization.  2. The right ventricle has normal systolic function. The cavity was normal. There is no increase in right ventricular wall thickness. Right ventricular systolic pressure could not be assessed.  3. There is mild mitral annular calcification present.  4. The aortic valve is tricuspid. Moderate calcification of the aortic valve. Aortic valve regurgitation is trivial by color flow Doppler.  5. The aorta is normal unless otherwise noted.  6. The interatrial  septum appears to be lipomatous.  EKG:  EKG is personally reviewed. 08/15/2022:  sinus bradycardia at 59 bpm, unchanged ST/T pattern 08/02/21 sinus bradycardia at 57 bpm, unchanged ST/T pattern 08/11/20 sinus rhythm at 67 bpm, nonspecific ST/T pattern anterolateral leads  Recent Labs: 09/19/2021: TSH 4.39 11/27/2021: BUN 16; Creatinine, Ser 0.85; Hemoglobin 13.1; Platelets 164; Potassium 3.5; Sodium 139   Recent Lipid Panel    Component Value Date/Time   CHOL 183 09/19/2021 1335   TRIG 123.0 09/19/2021 1335   HDL 60.20 09/19/2021 1335   CHOLHDL 3 09/19/2021 1335   VLDL 24.6 09/19/2021 1335   LDLCALC 98 09/19/2021 1335   LDLDIRECT 121.0 06/12/2017 1349    Physical Exam:    VS:  BP (!) 152/72 (BP Location: Right Arm, Patient Position: Sitting, Cuff Size: Large)   Pulse (!) 59   Ht $R'5\' 2"'ud$  (1.575 m)   Wt 194 lb 6.4 oz (88.2 kg)   BMI 35.56 kg/m    Wt Readings from Last 3 Encounters:  08/15/22 194 lb 6.4 oz (88.2 kg)  05/07/22 200 lb 9.6 oz (91 kg)  03/20/22 202 lb (91.6 kg)   GEN: Well nourished, well developed in no acute distress HEENT: Normal, moist mucous  membranes NECK: No JVD CARDIAC: regular rhythm, normal S1 and S2, no rubs or gallops. 2/6 systolic murmur. VASCULAR: Radial and DP pulses 2+ bilaterally. No carotid bruits RESPIRATORY:  velcro crackles without rales, wheezing or rhonchi  ABDOMEN: Soft, non-tender, non-distended MUSCULOSKELETAL:  Ambulates independently SKIN: Warm and dry, no edema NEUROLOGIC:  Alert and oriented x 3. No focal neuro deficits noted. PSYCHIATRIC:  Normal affect    ASSESSMENT:    1. Chronic diastolic CHF (congestive heart failure) ([place])   2. Essential hypertension   3. Aortic stenosis, mild   4. SOB (shortness of breath)   5. OSA on CPAP    PLAN:    Chronic diastolic heart failure Shortness of breath -appears euvolemic on exam -reviewed heart failure education, including daily weights, salt avoidance, fluid restrictions -doing much better on daily torsemide vs PRN, continue daily -remains short of breath with exertion. May be due to increase in BP with activity. Has some mild velcro crackles on exam chronically, not consistent with fluid. Has OSA/sleep study but has not had PFTs that I can see. Would consider PFTs or CT if shortness of breath worsens. -no chest pain, but if symptoms worsen could consider repeat stress test. Declines at this time.  Hypertension: prior hospitalization for hypertensive urgency 07/2020 -continue carvedilol 12.5 mg BID. Avoid bradycardia due to diastolic dysfunction -continue spirinolactone 12.5 mg daily -continue torsemide 20 mg daily -continue hydralazine 50 mg BID -cannot tolerate amlodipine, ACEi/ARB, see allergies  Murmur: aortic sclerosis with mild stenosis. Unchanged on exam today  OSA: on CPAP  Plan for follow up: 6 months or sooner as neded  Lisa Dresser, MD, PhD, [facility]   Medication Adjustments/Labs and Tests Ordered: Current medicines are reviewed at length with the patient today.  Concerns regarding medicines are  outlined above.   Orders Placed This Encounter  Procedures   EKG 12-Lead   No orders of the defined types were placed in this encounter.  Patient Instructions  Medication Instructions:  Your physician recommends that you continue on your current medications as directed. Please refer to the Current Medication list given to you today.   Labwork: none  Testing/Procedures: none  Follow-Up:  02/04/2023 at 1:20 with Dr Randall An Stumpf,acting  as a Education administrator for [employer], MD.,have documented all relevant documentation on the behalf of Lisa Dresser, MD,as directed by  Lisa Dresser, MD while in the presence of Lisa Dresser, MD.  I, Lisa Dresser, MD, have reviewed all documentation for this visit. The documentation on 08/15/22 for the exam, diagnosis, procedures, and orders are all accurate and complete.   Signed, Lisa Dresser, MD PhD 08/15/2022     [place]

## 2022-08-16 NOTE — Progress Notes (Signed)
[facility] Note  08/21/2022     CHIEF COMPLAINT Patient presents for Retina Follow Up   HISTORY OF PRESENT ILLNESS: [name] is a 88 y.o. female who presents to the clinic today for:   HPI     Retina Follow Up   Patient presents with  Other.  In right eye.  This started 3 months ago.  I, the attending physician,  performed the HPI with the patient and updated documentation appropriately.        Comments   Patient here for 3 month retina follow up for RMA OD. Patient states vision about the same. OS sometimes has to use gtts when feels like something in it. Sometimes feels like a stick in eye. Has lost some weight.       Last edited by Bernarda Caffey, MD on 08/21/2022  4:48 PM.    Pt states no change in vision, she feels like she has a "skim" over her left eye occasionally, she uses [facility] and it seems to help  Referring physician: [name], MD [name],  [place] [address]  HISTORICAL INFORMATION:   Selected notes from the MEDICAL RECORD NUMBER Referred by Dr. Lucianne Lei LEE:  Ocular Hx- PMH-    CURRENT MEDICATIONS: No current outpatient medications on file. (Ophthalmic Drugs)   No current facility-administered medications for this visit. (Ophthalmic Drugs)   Current Outpatient Medications (Other)  Medication Sig   albuterol (PROVENTIL) (2.5 MG/3ML) 0.083% nebulizer solution INHALE THE CONTENTS OF 1 VIAL VIA NEBULIZER TWICE DAILY AS NEEDED FOR WHEEZING OR SHORTNESS OF BREATH   carvedilol (COREG) 12.5 MG tablet TAKE 1 TABLET(12.5 MG) BY MOUTH TWICE DAILY WITH A MEAL   Cholecalciferol 2000 units TABS Take 2 tablets (4,000 Units total) by mouth daily.   clonazePAM (KLONOPIN) 1 MG tablet Take 1 mg by mouth 2 (two) times daily as needed.   hydrALAZINE (APRESOLINE) 50 MG tablet TAKE 1 TABLET(50 MG) BY MOUTH TWICE DAILY   HYDROcodone-acetaminophen (NORCO) 10-325 MG tablet Take 1 tablet by mouth every 6 (six) hours as  needed.   modafinil (PROVIGIL) 100 MG tablet Take 1 tablet (100 mg total) by mouth daily.   omeprazole (PRILOSEC) 40 MG capsule TAKE 1 CAPSULE(40 MG) BY MOUTH DAILY   promethazine (PHENERGAN) 12.5 MG tablet TAKE 1 TABLET(12.5 MG) BY MOUTH EVERY 8 HOURS AS NEEDED FOR NAUSEA OR VOMITING   spironolactone (ALDACTONE) 25 MG tablet TAKE 1/2 TABLET(12.5 MG) BY MOUTH DAILY   torsemide (DEMADEX) 20 MG tablet TAKE 1 TABLET BY MOUTH EVERY DAY   traZODone (DESYREL) 100 MG tablet TAKE 1 TABLET(100 MG) BY MOUTH AT BEDTIME   Vibegron (GEMTESA) 75 MG TABS Take 75 mg by mouth daily.   No current facility-administered medications for this visit. (Other)   REVIEW OF SYSTEMS: ROS   Positive for: Gastrointestinal, Eyes, Respiratory, Psychiatric Negative for: Constitutional, Neurological, Skin, Genitourinary, Musculoskeletal, HENT, Endocrine, Cardiovascular, Allergic/Imm, Heme/Lymph Last edited by Theodore Demark, COA on 08/21/2022  2:15 PM.     ALLERGIES Allergies  Allergen Reactions   Amlodipine Other (See Comments)    Reaction:  Dizziness    Enalapril Cough   Losartan Potassium Other (See Comments)    Reaction:  GI pain    Metformin Diarrhea   Morphine And Related Itching and Nausea And Vomiting   PAST MEDICAL HISTORY Past Medical History:  Diagnosis Date   Abdominal pain    Anemia    NOS  Anxiety    Back pain    Bruises easily    Chronic diastolic CHF (congestive heart failure) ([place])    a. 06/2013 EF 65-70%.   Chronic headaches    HISTORY OF    Depression    Diabetes mellitus    type II  DIET CONTROLLED   Diarrhea    Hepatitis    AGE 30S   Hyperlipidemia    Hypertension    Mild aortic stenosis    a. 06/2013 Echo: EF 65-70%, mod LVH with focal basal hypertrophy, very mild AS.   Osteoarthritis    Oxygen desaturation during sleep    USES 2 LITERS BEDTIME   VIA CPAP 06/2012 [facility]    Shortness of breath    WITH EXERTION USES 2 L O2 BEDTIME   Sleep apnea    CPAP WITH O2 2  LITERS 2013 ([facility])   Wears dentures    Wears glasses    Past Surgical History:  Procedure Laterality Date   ABDOMINAL HYSTERECTOMY     APPENDECTOMY     CHOLECYSTECTOMY     JOINT REPLACEMENT     LEFT KNEE    KNEE ARTHROPLASTY Right 03/22/2015   Procedure: COMPUTER ASSISTED TOTAL KNEE ARTHROPLASTY;  Surgeon: Marybelle Killings, MD;  Location: [place];  Service: Orthopedics;  Laterality: Right;   KNEE ARTHROSCOPY     RIGHT   LEFT AND RIGHT HEART CATHETERIZATION WITH CORONARY ANGIOGRAM N/A 10/28/2013   Procedure: LEFT AND RIGHT HEART CATHETERIZATION WITH CORONARY ANGIOGRAM;  Surgeon: Blane Ohara, MD;  Location: [facility] C[facility] LAB;  Service: Cardiovascular;  Laterality: N/A;   LEFT HEART CATHETERIZATION WITH CORONARY ANGIOGRAM N/A 11/14/2014   Procedure: LEFT HEART CATHETERIZATION WITH CORONARY ANGIOGRAM;  Surgeon: Josue Hector, MD;  Location: [facility] CATH LAB;  Service: Cardiovascular;  Laterality: N/A;   REVISION TOTAL KNEE ARTHROPLASTY  2011   ROTATOR CUFF REPAIR  2010   SHOULDER ARTHROSCOPY  09/17/2012   Procedure: ARTHROSCOPY SHOULDER;  Surgeon: Sharmon Revere, MD;  Location: [place];  Service: Orthopedics;  Laterality: Left;   TONSILLECTOMY     TONSILLECTOMY     FAMILY HISTORY Family History  Problem Relation Age of Onset   Cancer Father        prostate cancer   Heart disease Sister    Heart disease Brother    Alcohol abuse Other    Hypertension Other    Kidney disease Other    Mental illness Other    Emphysema Brother    SOCIAL HISTORY Social History   Tobacco Use   Smoking status: Never   Smokeless tobacco: Never  Vaping Use   Vaping Use: Never used  Substance Use Topics   Alcohol use: No   Drug use: No       OPHTHALMIC EXAM:  Base Eye Exam     Visual Acuity (Snellen - Linear)       Right Left   Dist cc 20/25 20/25 +1   Dist ph cc NI NI         Tonometry (Tonopen, 2:12 PM)       Right Left   Pressure 10 11         Pupils       Dark Light Shape React APD    Right 2 1 Round Brisk None   Left 2 1 Round Brisk None         Visual Fields (Counting fingers)       Left Right  Full Full         Extraocular Movement       Right Left    Full, Ortho Full, Ortho         Neuro/Psych     Oriented x3: Yes   Mood/Affect: Normal         Dilation     Both eyes: 1.0% Mydriacyl, 2.5% Phenylephrine @ 2:12 PM           Slit Lamp and Fundus Exam     External Exam       Right Left   External Normal Normal         Slit Lamp Exam       Right Left   Lids/Lashes Dermatochalasis - upper lid Dermatochalasis - upper lid   Conjunctiva/Sclera mild melanosis mild melanosis   Cornea trace PEE, well healed cataract wound, mild arcus Arcus, trace PEE   Anterior Chamber deep and clear deep and clear   Iris Round and dilated, No NVI Round and dilated, No NVI   Lens 3 piece PC IOL in good position 3 piece PC IOL in good position   Anterior Vitreous Normal Normal         Fundus Exam       Right Left   Disc mild Pallor, Sharp rim mild Pallor, Sharp rim, temporal PPA, +SVP   C/D Ratio 0.2 0.3   Macula Flat, Good foveal reflex, RPE mottling, old, white subretinal heme and pigmentation superior macula along ST arcades Flat, Good foveal reflex, RPE mottling, No heme or edema   Vessels attenuated, copper wiring, Tortuous, AV crossing changes, white RAM along ST arcades with surrounding atrophy and pigment clumping attenuated, Tortuous, AV crossing changes, mild copper wiring   Periphery Attached, focal atrophy around ST RAM, peripheral drusen, no heme Attached, mild midzonal drusen greatest temporally, No heme           Refraction     Wearing Rx       Sphere Cylinder Axis Add   Right -1.00 +1.25 005 +2.50   Left -3.00 +1.50 003 +2.50            IMAGING AND PROCEDURES  Imaging and Procedures for 08/21/2022  OCT, Retina - OU - Both Eyes       Right Eye Quality was good. Central Foveal Thickness: 258. Progression has  improved. Findings include normal foveal contour, no IRF, no SRF, subretinal hyper-reflective material, inner retinal atrophy, outer retinal atrophy, vitreomacular adhesion (Focal SRHM/IRHM superior macula along ST arcades - improving).   Left Eye Quality was good. Central Foveal Thickness: 238. Progression has been stable. Findings include normal foveal contour, no IRF, no SRF, vitreomacular adhesion .   Notes *Images captured and stored on drive  Diagnosis / Impression:  OD: Focal SRHM/IRHM superior macula along ST arcades -- improving OS: NFP; no IRF/SRF; +VMA  Clinical management:  See below  Abbreviations: NFP - Normal foveal profile. CME - cystoid macular edema. PED - pigment epithelial detachment. IRF - intraretinal fluid. SRF - subretinal fluid. EZ - ellipsoid zone. ERM - epiretinal membrane. ORA - outer retinal atrophy. ORT - outer retinal tubulation. SRHM - subretinal hyper-reflective material. IRHM - intraretinal hyper-reflective material            ASSESSMENT/PLAN:    ICD-10-CM   1. Retinal macroaneurysm of right eye  H35.011 OCT, Retina - OU - Both Eyes    2. Subretinal hemorrhage of right eye  H35.61     3. Essential  hypertension  I10     4. Hypertensive retinopathy of both eyes  H35.033     5. Diabetes mellitus type 2 without retinopathy ([place])  E11.9     6. Pseudophakia, both eyes  Z96.1      1,2. Old subretinal hemorrhage from retinal macroaneurysm OD  - RAM along ST arcades w/ surrounding old [place] (white), pigment clumping and atrophy -- improving  - pt reports elevated BP and hospitalization June 2022 -- SBP 190s-218  - suspect RAM w/ SRH related to severely elevated BP  - pt asymptomatic with BCVA OD 20/25 -- stable  - exam shows white SRH almost resolved -- no active red heme  - FA 6.28.23 -- no leakage to suggest active RAM  - no retinal or ophthalmic interventions indicated or recommended   - monitor  - f/u 3-4 months, sooner prn -- DFE/OCT  3,4.  Hypertensive retinopathy OU - discussed importance of tight BP control and likely relation to #1,2 above - monitor  5. Diabetes mellitus, type 2 without retinopathy  - A1c 4.8 on 10.26.22 - The incidence, risk factors for progression, natural history and treatment options for diabetic retinopathy  were discussed with patient.   - The need for close monitoring of blood glucose, blood pressure, and serum lipids, avoiding cigarette or any type of tobacco, and the need for long term follow up was also discussed with patient. - monitor  6. Pseudophakia OU  - s/p CE/IOL OU  - IOLs in good position, doing well  - monitor  Ophthalmic Meds Ordered this visit:  No orders of the defined types were placed in this encounter.    Return for f/u 3-4 months, RAM OD, DFE, OCT.  There are no Patient Instructions on file for this visit.   Explained the diagnoses, plan, and follow up with the patient and they expressed understanding.  Patient expressed understanding of the importance of proper follow up care.   This document serves as a record of services personally performed by Gardiner Sleeper, MD, PhD. It was created on their behalf by Orvan Falconer, an ophthalmic technician. The creation of this record is the provider's dictation and/or activities 238during the visit.    Electronically signed by: Orvan Falconer, OA, 08/21/22  4:49 PM  This document serves as a record of services personally performed by Gardiner Sleeper, MD, PhD. It was created on their behalf by San Jetty. Owens Shark, OA an ophthalmic technician. The creation of this record is the provider's dictation and/or activities during the visit.    Electronically signed by: San Jetty. Owens Shark, [place] 09.27.2023 4:49 PM  Gardiner Sleeper, M.D., Ph.D. Diseases & Surgery of the Retina and Vitreous [facility]  I have reviewed the above documentation for accuracy and completeness, and I agree with the above. Gardiner Sleeper,  M.D., Ph.D. 08/21/22 4:51 PM   Abbreviations: M myopia (nearsighted); A astigmatism; H hyperopia (farsighted); P presbyopia; Mrx spectacle prescription;  CTL contact lenses; OD right eye; OS left eye; OU both eyes  XT exotropia; ET esotropia; PEK punctate epithelial keratitis; PEE punctate epithelial erosions; DES dry eye syndrome; MGD meibomian gland dysfunction; ATs artificial tears; PFAT's preservative free artificial tears; Rachel nuclear sclerotic cataract; PSC posterior subcapsular cataract; ERM epi-retinal membrane; PVD posterior vitreous detachment; RD retinal detachment; DM diabetes mellitus; DR diabetic retinopathy; NPDR non-proliferative diabetic retinopathy; PDR proliferative diabetic retinopathy; CSME clinically significant macular edema; DME diabetic macular edema; dbh dot blot hemorrhages; CWS cotton wool spot;  POAG primary open angle glaucoma; C/D cup-to-disc ratio; HVF humphrey visual field; GVF goldmann visual field; OCT optical coherence tomography; IOP intraocular pressure; BRVO Branch retinal vein occlusion; CRVO central retinal vein occlusion; CRAO central retinal artery occlusion; BRAO branch retinal artery occlusion; RT retinal tear; SB scleral buckle; PPV pars plana vitrectomy; VH Vitreous hemorrhage; PRP panretinal laser photocoagulation; IVK intravitreal kenalog; VMT vitreomacular traction; MH Macular hole;  NVD neovascularization of the disc; NVE neovascularization elsewhere; AREDS age related eye disease study; ARMD age related macular degeneration; POAG primary open angle glaucoma; EBMD epithelial/anterior basement membrane dystrophy; ACIOL anterior chamber intraocular lens; IOL intraocular lens; PCIOL posterior chamber intraocular lens; Phaco/IOL phacoemulsification with intraocular lens placement; [place] photorefractive keratectomy; LASIK laser assisted in situ keratomileusis; HTN hypertension; DM diabetes mellitus; COPD chronic obstructive pulmonary disease

## 2022-08-21 ENCOUNTER — Ambulatory Visit (INDEPENDENT_AMBULATORY_CARE_PROVIDER_SITE_OTHER): Payer: Medicare HMO | Admitting: Ophthalmology

## 2022-08-21 ENCOUNTER — Encounter (INDEPENDENT_AMBULATORY_CARE_PROVIDER_SITE_OTHER): Payer: Self-pay | Admitting: Ophthalmology

## 2022-08-21 DIAGNOSIS — H35033 Hypertensive retinopathy, bilateral: Secondary | ICD-10-CM | POA: Diagnosis not present

## 2022-08-21 DIAGNOSIS — H3561 Retinal hemorrhage, right eye: Secondary | ICD-10-CM | POA: Diagnosis not present

## 2022-08-21 DIAGNOSIS — I10 Essential (primary) hypertension: Secondary | ICD-10-CM

## 2022-08-21 DIAGNOSIS — H35011 Changes in retinal vascular appearance, right eye: Secondary | ICD-10-CM

## 2022-08-21 DIAGNOSIS — E119 Type 2 diabetes mellitus without complications: Secondary | ICD-10-CM

## 2022-08-21 DIAGNOSIS — Z961 Presence of intraocular lens: Secondary | ICD-10-CM

## 2022-08-24 ENCOUNTER — Other Ambulatory Visit: Payer: Self-pay | Admitting: Internal Medicine

## 2022-08-24 DIAGNOSIS — J4520 Mild intermittent asthma, uncomplicated: Secondary | ICD-10-CM

## 2022-08-28 ENCOUNTER — Encounter (HOSPITAL_BASED_OUTPATIENT_CLINIC_OR_DEPARTMENT_OTHER): Payer: Self-pay | Admitting: Cardiology

## 2022-08-29 ENCOUNTER — Telehealth: Payer: Self-pay

## 2022-08-29 NOTE — Telephone Encounter (Signed)
Pt is requesting refill on: HYDROcodone-acetaminophen [facility]) 10-325 MG tablet  Pharmacy: [facility] [phone] - [place], [place] AT [place] 03/20/22 ROV 09/23/22

## 2022-08-30 ENCOUNTER — Other Ambulatory Visit: Payer: Self-pay | Admitting: Internal Medicine

## 2022-08-30 DIAGNOSIS — M159 Polyosteoarthritis, unspecified: Secondary | ICD-10-CM

## 2022-08-30 DIAGNOSIS — Z515 Encounter for palliative care: Secondary | ICD-10-CM

## 2022-08-30 DIAGNOSIS — G894 Chronic pain syndrome: Secondary | ICD-10-CM

## 2022-08-30 MED ORDER — HYDROCODONE-ACETAMINOPHEN 10-325 MG PO TABS: 1.0000 | ORAL_TABLET | Freq: Four times a day (QID) | ORAL | 0 refills | Status: DC | PRN

## 2022-08-30 NOTE — Telephone Encounter (Signed)
Patient is calling to check to make sure her hydrocodone will be sent in today - please advise.

## 2022-09-02 ENCOUNTER — Telehealth: Payer: Self-pay | Admitting: Internal Medicine

## 2022-09-02 NOTE — Telephone Encounter (Signed)
Patient states that hydrocodone is on back order at [facility] - she would like something else called in.  Patient has not had any pain medicaition since before 08/05/2022

## 2022-09-02 NOTE — Telephone Encounter (Signed)
Patient has called back - states the pain is so bad she has begun to vomit. Requests call back as soon as possible at [phone]

## 2022-09-04 ENCOUNTER — Ambulatory Visit (INDEPENDENT_AMBULATORY_CARE_PROVIDER_SITE_OTHER): Payer: Medicare HMO | Admitting: Internal Medicine

## 2022-09-04 ENCOUNTER — Encounter: Payer: Self-pay | Admitting: Internal Medicine

## 2022-09-04 VITALS — BP 150/82 | HR 67 | Temp 97.9°F | Ht 62.0 in | Wt 188.0 lb

## 2022-09-04 DIAGNOSIS — J4520 Mild intermittent asthma, uncomplicated: Secondary | ICD-10-CM | POA: Diagnosis not present

## 2022-09-04 DIAGNOSIS — K219 Gastro-esophageal reflux disease without esophagitis: Secondary | ICD-10-CM | POA: Diagnosis not present

## 2022-09-04 DIAGNOSIS — Z23 Encounter for immunization: Secondary | ICD-10-CM

## 2022-09-04 DIAGNOSIS — Z515 Encounter for palliative care: Secondary | ICD-10-CM

## 2022-09-04 DIAGNOSIS — M159 Polyosteoarthritis, unspecified: Secondary | ICD-10-CM

## 2022-09-04 MED ORDER — OXYCODONE HCL 5 MG PO TABS: 5.0000 mg | ORAL_TABLET | Freq: Three times a day (TID) | ORAL | 0 refills | Status: DC | PRN

## 2022-09-04 MED ORDER — ALBUTEROL SULFATE (2.5 MG/3ML) 0.083% IN NEBU: 2.5000 mg | INHALATION_SOLUTION | Freq: Four times a day (QID) | RESPIRATORY_TRACT | 3 refills | Status: DC | PRN

## 2022-09-04 MED ORDER — PROMETHAZINE HCL 12.5 MG PO TABS: ORAL_TABLET | ORAL | 5 refills | Status: DC

## 2022-09-04 NOTE — Progress Notes (Unsigned)
Subjective:  Patient ID: [name], female    DOB: 24-Dec-1932  Age: 88 y.o. MRN: [id number]  CC: Gastroesophageal Reflux and Osteoarthritis   HPI [name] presents for f/up -  She has not taken an opiate in several weeks and feels like she has been going through withdrawal.  Her hydrocodone is no longer available and she needs to change to a different opiate.  She complains of nausea, vomiting, feeling anxious, and shaky.  Outpatient Medications Prior to Visit  Medication Sig Dispense Refill   carvedilol (COREG) 12.5 MG tablet TAKE 1 TABLET(12.5 MG) BY MOUTH TWICE DAILY WITH A MEAL 180 tablet 1   Cholecalciferol 2000 units TABS Take 2 tablets (4,000 Units total) by mouth daily. 180 tablet 1   clonazePAM (KLONOPIN) 1 MG tablet Take 1 mg by mouth 2 (two) times daily as needed.     hydrALAZINE (APRESOLINE) 50 MG tablet TAKE 1 TABLET(50 MG) BY MOUTH TWICE DAILY 180 tablet 1   omeprazole (PRILOSEC) 40 MG capsule TAKE 1 CAPSULE(40 MG) BY MOUTH DAILY 90 capsule 1   spironolactone (ALDACTONE) 25 MG tablet TAKE 1/2 TABLET(12.5 MG) BY MOUTH DAILY 45 tablet 1   torsemide (DEMADEX) 20 MG tablet TAKE 1 TABLET BY MOUTH EVERY DAY 90 tablet 1   traZODone (DESYREL) 100 MG tablet TAKE 1 TABLET(100 MG) BY MOUTH AT BEDTIME 90 tablet 1   Vibegron (GEMTESA) 75 MG TABS Take 75 mg by mouth daily.     albuterol (PROVENTIL) (2.5 MG/3ML) 0.083% nebulizer solution INHALE THE CONTENTS OF 1 VIAL VIA NEBULIZER TWICE DAILY AS NEEDED FOR WHEEZING OR SHORTNESS OF BREATH 75 mL 3   HYDROcodone-acetaminophen (NORCO) 10-325 MG tablet Take 1 tablet by mouth every 6 (six) hours as needed. 90 tablet 0   promethazine (PHENERGAN) 12.5 MG tablet TAKE 1 TABLET(12.5 MG) BY MOUTH EVERY 8 HOURS AS NEEDED FOR NAUSEA OR VOMITING 20 tablet 2   modafinil (PROVIGIL) 100 MG tablet Take 1 tablet (100 mg total) by mouth daily. (Patient not taking: Reported on 09/04/2022) 90 tablet 0   No facility-administered medications prior to  visit.    ROS Review of Systems  Constitutional:  Positive for fatigue. Negative for diaphoresis and unexpected weight change.  HENT: Negative.    Eyes: Negative.   Respiratory:  Positive for shortness of breath. Negative for cough and chest tightness.   Cardiovascular:  Negative for chest pain, palpitations and leg swelling.  Gastrointestinal:  Negative for abdominal pain, constipation, diarrhea, nausea and vomiting.  Genitourinary: Negative.   Musculoskeletal:  Positive for arthralgias and back pain. Negative for myalgias.  Skin: Negative.   Neurological:  Negative for dizziness, weakness and headaches.  Hematological:  Negative for adenopathy. Does not bruise/bleed easily.  Psychiatric/Behavioral:  Positive for sleep disturbance. The patient is nervous/anxious.     Objective:  BP (!) 150/82   Pulse 67   Temp 97.9 F (36.6 C) (Oral)   Ht '5\' 2"'$  (1.575 m)   Wt 188 lb (85.3 kg)   SpO2 97%   BMI 34.39 kg/m   BP Readings from Last 3 Encounters:  09/04/22 (!) 150/82  08/15/22 130/62  05/22/22 99/60    Wt Readings from Last 3 Encounters:  09/04/22 188 lb (85.3 kg)  08/15/22 194 lb 6.4 oz (88.2 kg)  05/07/22 200 lb 9.6 oz (91 kg)    Physical Exam  Lab Results  Component Value Date   WBC 8.5 11/27/2021   HGB 13.1 11/27/2021   HCT 42.0 11/27/2021  PLT 164 11/27/2021   GLUCOSE 112 (H) 11/27/2021   CHOL 183 09/19/2021   TRIG 123.0 09/19/2021   HDL 60.20 09/19/2021   LDLDIRECT 121.0 06/12/2017   LDLCALC 98 09/19/2021   ALT 10 04/29/2021   AST 13 (L) 04/29/2021   NA 139 11/27/2021   K 3.5 11/27/2021   CL 103 11/27/2021   CREATININE 0.85 11/27/2021   BUN 16 11/27/2021   CO2 27 11/27/2021   TSH 4.39 09/19/2021   INR 1.0 08/11/2020   HGBA1C 4.8 09/19/2021    DG Chest Portable 1 View  Result Date: 11/27/2021 CLINICAL DATA:  Hyperventilation, difficulty breathing EXAM: PORTABLE CHEST 1 VIEW COMPARISON:  08/12/2020 FINDINGS: Cardiomegaly. Both lungs are clear. The  visualized skeletal structures are unremarkable. IMPRESSION: Cardiomegaly without acute abnormality of the lungs in AP portable projection. Electronically Signed   By: Delanna Ahmadi M.D.   On: 11/27/2021 14:28    Assessment & Plan:   [name] was seen today for gastroesophageal reflux and osteoarthritis.  Diagnoses and all orders for this visit:  Primary osteoarthritis involving multiple joints -     oxyCODONE (OXY IR/ROXICODONE) 5 MG immediate release tablet; Take 1 tablet (5 mg total) by mouth every 8 (eight) hours as needed for severe pain.  Encounter for palliative care involving management of pain -     oxyCODONE (OXY IR/ROXICODONE) 5 MG immediate release tablet; Take 1 tablet (5 mg total) by mouth every 8 (eight) hours as needed for severe pain.  Gastroesophageal reflux disease without esophagitis -     promethazine (PHENERGAN) 12.5 MG tablet; TAKE 1 TABLET(12.5 MG) BY MOUTH EVERY 8 HOURS AS NEEDED FOR NAUSEA OR VOMITING  Mild intermittent asthma, unspecified whether complicated -     albuterol (PROVENTIL) (2.5 MG/3ML) 0.083% nebulizer solution; Take 3 mLs (2.5 mg total) by nebulization every 6 (six) hours as needed for wheezing or shortness of breath.  Flu vaccine need -     Flu Vaccine QUAD High Dose(Fluad)   I have discontinued [name]'s modafinil and HYDROcodone-acetaminophen. I have also changed her albuterol. Additionally, I am having her start on oxyCODONE. Lastly, I am having her maintain her Cholecalciferol, Gemtesa, omeprazole, carvedilol, traZODone, torsemide, spironolactone, hydrALAZINE, clonazePAM, and promethazine.  Meds ordered this encounter  Medications   promethazine (PHENERGAN) 12.5 MG tablet    Sig: TAKE 1 TABLET(12.5 MG) BY MOUTH EVERY 8 HOURS AS NEEDED FOR NAUSEA OR VOMITING    Dispense:  20 tablet    Refill:  5   albuterol (PROVENTIL) (2.5 MG/3ML) 0.083% nebulizer solution    Sig: Take 3 mLs (2.5 mg total) by nebulization every 6 (six) hours as needed  for wheezing or shortness of breath.    Dispense:  225 mL    Refill:  3   oxyCODONE (OXY IR/ROXICODONE) 5 MG immediate release tablet    Sig: Take 1 tablet (5 mg total) by mouth every 8 (eight) hours as needed for severe pain.    Dispense:  90 tablet    Refill:  0     Follow-up: Return in about 3 months (around 12/05/2022).  Scarlette Calico, MD

## 2022-09-04 NOTE — Patient Instructions (Signed)
Gastroesophageal Reflux Disease, Adult Gastroesophageal reflux (GER) happens when acid from the stomach flows up into the tube that connects the mouth and the stomach (esophagus). Normally, food travels down the esophagus and stays in the stomach to be digested. However, when a person has GER, food and stomach acid sometimes move back up into the esophagus. If this becomes a more serious problem, the person may be diagnosed with a disease called gastroesophageal reflux disease (GERD). GERD occurs when the reflux: Happens often. Causes frequent or severe symptoms. Causes problems such as damage to the esophagus. When stomach acid comes in contact with the esophagus, the acid may cause inflammation in the esophagus. Over time, GERD may create small holes (ulcers) in the lining of the esophagus. What are the causes? This condition is caused by a problem with the muscle between the esophagus and the stomach (lower esophageal sphincter, or LES). Normally, the LES muscle closes after food passes through the esophagus to the stomach. When the LES is weakened or abnormal, it does not close properly, and that allows food and stomach acid to go back up into the esophagus. The LES can be weakened by certain dietary substances, medicines, and medical conditions, including: Tobacco use. Pregnancy. Having a hiatal hernia. Alcohol use. Certain foods and beverages, such as coffee, chocolate, onions, and peppermint. What increases the risk? You are more likely to develop this condition if you: Have an increased body weight. Have a connective tissue disorder. Take NSAIDs, such as ibuprofen. What are the signs or symptoms? Symptoms of this condition include: Heartburn. Difficult or painful swallowing and the feeling of having a lump in the throat. A bitter taste in the mouth. Bad breath and having a large amount of saliva. Having an upset or bloated stomach and belching. Chest pain. Different conditions can  cause chest pain. Make sure you see your health care provider if you experience chest pain. Shortness of breath or wheezing. Ongoing (chronic) cough or a nighttime cough. Wearing away of tooth enamel. Weight loss. How is this diagnosed? This condition may be diagnosed based on a medical history and a physical exam. To determine if you have mild or severe GERD, your health care provider may also monitor how you respond to treatment. You may also have tests, including: A test to examine your stomach and esophagus with a small camera (endoscopy). A test that measures the acidity level in your esophagus. A test that measures how much pressure is on your esophagus. A barium swallow or modified barium swallow test to show the shape, size, and functioning of your esophagus. How is this treated? Treatment for this condition may vary depending on how severe your symptoms are. Your health care provider may recommend: Changes to your diet. Medicine. Surgery. The goal of treatment is to help relieve your symptoms and to prevent complications. Follow these instructions at home: Eating and drinking  Follow a diet as recommended by your health care provider. This may involve avoiding foods and drinks such as: Coffee and tea, with or without caffeine. Drinks that contain alcohol. Energy drinks and sports drinks. Carbonated drinks or sodas. Chocolate and cocoa. Peppermint and mint flavorings. Garlic and onions. Horseradish. Spicy and acidic foods, including peppers, chili powder, curry powder, vinegar, hot sauces, and barbecue sauce. Citrus fruit juices and citrus fruits, such as oranges, lemons, and limes. Tomato-based foods, such as red sauce, chili, salsa, and pizza with red sauce. Fried and fatty foods, such as donuts, french fries, potato chips, and high-fat dressings.   High-fat meats, such as hot dogs and fatty cuts of red and white meats, such as rib eye steak, sausage, ham, and  bacon. High-fat dairy items, such as whole milk, butter, and cream cheese. Eat small, frequent meals instead of large meals. Avoid drinking large amounts of liquid with your meals. Avoid eating meals during the 2-3 hours before bedtime. Avoid lying down right after you eat. Do not exercise right after you eat. Lifestyle  Do not use any products that contain nicotine or tobacco. These products include cigarettes, chewing tobacco, and vaping devices, such as e-cigarettes. If you need help quitting, ask your health care provider. Try to reduce your stress by using methods such as yoga or meditation. If you need help reducing stress, ask your health care provider. If you are overweight, reduce your weight to an amount that is healthy for you. Ask your health care provider for guidance about a safe weight loss goal. General instructions Pay attention to any changes in your symptoms. Take over-the-counter and prescription medicines only as told by your health care provider. Do not take aspirin, ibuprofen, or other NSAIDs unless your health care provider told you to take these medicines. Wear loose-fitting clothing. Do not wear anything tight around your waist that causes pressure on your abdomen. Raise (elevate) the head of your bed about 6 inches (15 cm). You can use a wedge to do this. Avoid bending over if this makes your symptoms worse. Keep all follow-up visits. This is important. Contact a health care provider if: You have: New symptoms. Unexplained weight loss. Difficulty swallowing or it hurts to swallow. Wheezing or a persistent cough. A hoarse voice. Your symptoms do not improve with treatment. Get help right away if: You have sudden pain in your arms, neck, jaw, teeth, or back. You suddenly feel sweaty, dizzy, or light-headed. You have chest pain or shortness of breath. You vomit and the vomit is green, yellow, or black, or it looks like blood or coffee grounds. You faint. You  have stool that is red, bloody, or black. You cannot swallow, drink, or eat. These symptoms may represent a serious problem that is an emergency. Do not wait to see if the symptoms will go away. Get medical help right away. Call your local emergency services (911 in the [place] Do not drive yourself to the hospital. Summary Gastroesophageal reflux happens when acid from the stomach flows up into the esophagus. GERD is a disease in which the reflux happens often, causes frequent or severe symptoms, or causes problems such as damage to the esophagus. Treatment for this condition may vary depending on how severe your symptoms are. Your health care provider may recommend diet and lifestyle changes, medicine, or surgery. Contact a health care provider if you have new or worsening symptoms. Take over-the-counter and prescription medicines only as told by your health care provider. Do not take aspirin, ibuprofen, or other NSAIDs unless your health care provider told you to do so. Keep all follow-up visits as told by your health care provider. This is important. This information is not intended to replace advice given to you by your health care provider. Make sure you discuss any questions you have with your health care provider. Document Revised: 05/22/2020 Document Reviewed: 05/22/2020 Elsevier Patient Education  2023 [employer]vier Inc.  

## 2022-09-23 ENCOUNTER — Encounter: Payer: Self-pay | Admitting: Internal Medicine

## 2022-09-23 ENCOUNTER — Ambulatory Visit (INDEPENDENT_AMBULATORY_CARE_PROVIDER_SITE_OTHER): Payer: Medicare HMO | Admitting: Internal Medicine

## 2022-09-23 VITALS — BP 138/66 | HR 64 | Temp 98.0°F | Resp 16 | Ht 62.0 in | Wt 163.0 lb

## 2022-09-23 DIAGNOSIS — Z515 Encounter for palliative care: Secondary | ICD-10-CM

## 2022-09-23 DIAGNOSIS — E785 Hyperlipidemia, unspecified: Secondary | ICD-10-CM

## 2022-09-23 DIAGNOSIS — I10 Essential (primary) hypertension: Secondary | ICD-10-CM

## 2022-09-23 DIAGNOSIS — Z0001 Encounter for general adult medical examination with abnormal findings: Secondary | ICD-10-CM | POA: Diagnosis not present

## 2022-09-23 DIAGNOSIS — K219 Gastro-esophageal reflux disease without esophagitis: Secondary | ICD-10-CM

## 2022-09-23 DIAGNOSIS — M159 Polyosteoarthritis, unspecified: Secondary | ICD-10-CM

## 2022-09-23 LAB — BASIC METABOLIC PANEL
BUN: 20 mg/dL (ref 6–23)
CO2: 33 mEq/L — ABNORMAL HIGH (ref 19–32)
Calcium: 9.6 mg/dL (ref 8.4–10.5)
Chloride: 103 mEq/L (ref 96–112)
Creatinine, Ser: 0.7 mg/dL (ref 0.40–1.20)
GFR: 77.66 mL/min (ref >60.00)
Glucose, Bld: 85 mg/dL (ref 70–99)
Potassium: 4.1 mEq/L (ref 3.5–5.1)
Sodium: 141 mEq/L (ref 135–145)

## 2022-09-23 LAB — CBC WITH DIFFERENTIAL/PLATELET
Basophils Absolute: 0 10*3/uL (ref 0.0–0.1)
Basophils Relative: 0.5 % (ref 0.0–3.0)
Eosinophils Absolute: 0.3 10*3/uL (ref 0.0–0.7)
Eosinophils Relative: 3.6 % (ref 0.0–5.0)
HCT: 38.5 % (ref 36.0–46.0)
Hemoglobin: 12.4 g/dL (ref 12.0–15.0)
Lymphocytes Relative: 28.2 % (ref 12.0–46.0)
Lymphs Abs: 2.2 10*3/uL (ref 0.7–4.0)
MCHC: 32.2 g/dL (ref 30.0–36.0)
MCV: 92.5 fl (ref 78.0–100.0)
Monocytes Absolute: 0.6 10*3/uL (ref 0.1–1.0)
Monocytes Relative: 7.5 % (ref 3.0–12.0)
Neutro Abs: 4.7 10*3/uL (ref 1.4–7.7)
Neutrophils Relative %: 60.2 % (ref 43.0–77.0)
Platelets: 139 10*3/uL — ABNORMAL LOW (ref 150.0–400.0)
RBC: 4.16 Mil/uL (ref 3.87–5.11)
RDW: 13.6 % (ref 11.5–15.5)
WBC: 7.8 10*3/uL (ref 4.0–10.5)

## 2022-09-23 LAB — HEPATIC FUNCTION PANEL
ALT: 9 U/L (ref 0–35)
AST: 14 U/L (ref 0–37)
Albumin: 3.9 g/dL (ref 3.5–5.2)
Alkaline Phosphatase: 53 U/L (ref 39–117)
Bilirubin, Direct: 0.1 mg/dL (ref 0.0–0.3)
Total Bilirubin: 0.7 mg/dL (ref 0.2–1.2)
Total Protein: 6.4 g/dL (ref 6.0–8.3)

## 2022-09-23 LAB — LIPID PANEL
Cholesterol: 194 mg/dL (ref 0–200)
HDL: 63.7 mg/dL (ref >39.00)
LDL Cholesterol: 90 mg/dL (ref 0–99)
NonHDL: 129.83
Total CHOL/HDL Ratio: 3
Triglycerides: 198 mg/dL — ABNORMAL HIGH (ref 0.0–149.0)
VLDL: 39.6 mg/dL (ref 0.0–40.0)

## 2022-09-23 LAB — TSH: TSH: 5.13 u[IU]/mL (ref 0.35–5.50)

## 2022-09-23 NOTE — Patient Instructions (Signed)

## 2022-09-23 NOTE — Progress Notes (Unsigned)
Subjective:  Patient ID: [name], female    DOB: Aug 06, 1933  Age: 88 y.o. MRN: [id number]  CC: Annual Exam, Hypertension, Gastroesophageal Reflux, and Osteoarthritis   HPI [name] presents for a CPX and f/up -   She continues to c/o MSK pain and does not want to take oxycodone and requests a refill of hydrocodone.  Outpatient Medications Prior to Visit  Medication Sig Dispense Refill   albuterol (PROVENTIL) (2.5 MG/3ML) 0.083% nebulizer solution Take 3 mLs (2.5 mg total) by nebulization every 6 (six) hours as needed for wheezing or shortness of breath. 225 mL 3   carvedilol (COREG) 12.5 MG tablet TAKE 1 TABLET(12.5 MG) BY MOUTH TWICE DAILY WITH A MEAL 180 tablet 1   Cholecalciferol 2000 units TABS Take 2 tablets (4,000 Units total) by mouth daily. 180 tablet 1   clonazePAM (KLONOPIN) 1 MG tablet Take 1 mg by mouth 2 (two) times daily as needed.     hydrALAZINE (APRESOLINE) 50 MG tablet TAKE 1 TABLET(50 MG) BY MOUTH TWICE DAILY 180 tablet 1   omeprazole (PRILOSEC) 40 MG capsule TAKE 1 CAPSULE(40 MG) BY MOUTH DAILY 90 capsule 1   promethazine (PHENERGAN) 12.5 MG tablet TAKE 1 TABLET(12.5 MG) BY MOUTH EVERY 8 HOURS AS NEEDED FOR NAUSEA OR VOMITING 20 tablet 5   spironolactone (ALDACTONE) 25 MG tablet TAKE 1/2 TABLET(12.5 MG) BY MOUTH DAILY 45 tablet 1   torsemide (DEMADEX) 20 MG tablet TAKE 1 TABLET BY MOUTH EVERY DAY 90 tablet 1   traZODone (DESYREL) 100 MG tablet TAKE 1 TABLET(100 MG) BY MOUTH AT BEDTIME 90 tablet 1   Vibegron (GEMTESA) 75 MG TABS Take 75 mg by mouth daily.     oxyCODONE (OXY IR/ROXICODONE) 5 MG immediate release tablet Take 1 tablet (5 mg total) by mouth every 8 (eight) hours as needed for severe pain. 90 tablet 0   No facility-administered medications prior to visit.    ROS Review of Systems  Constitutional:  Negative for chills, diaphoresis, fatigue and fever.  HENT: Negative.    Eyes: Negative.   Respiratory:  Positive for wheezing. Negative for  cough, chest tightness and shortness of breath.   Cardiovascular:  Negative for chest pain, palpitations and leg swelling.  Gastrointestinal:  Negative for abdominal pain, diarrhea, nausea and vomiting.  Endocrine: Negative.   Genitourinary: Negative.  Negative for difficulty urinating.  Musculoskeletal:  Positive for arthralgias. Negative for back pain and myalgias.  Skin: Negative.   Neurological: Negative.  Negative for dizziness, weakness and light-headedness.  Hematological:  Negative for adenopathy. Does not bruise/bleed easily.  Psychiatric/Behavioral:  Negative for decreased concentration, dysphoric mood and sleep disturbance. The patient is nervous/anxious.     Objective:  BP 138/66 (BP Location: Right Arm, Patient Position: Sitting, Cuff Size: Large)   Pulse 64   Temp 98 F (36.7 C) (Oral)   Resp 16   Ht '5\' 2"'$  (1.575 m)   Wt 163 lb (73.9 kg)   SpO2 92%   BMI 29.81 kg/m   BP Readings from Last 3 Encounters:  09/23/22 138/66  09/04/22 (!) 150/82  08/15/22 130/62    Wt Readings from Last 3 Encounters:  09/23/22 163 lb (73.9 kg)  09/04/22 188 lb (85.3 kg)  08/15/22 194 lb 6.4 oz (88.2 kg)    Physical Exam Vitals reviewed.  HENT:     Nose: Nose normal.     Mouth/Throat:     Mouth: Mucous membranes are moist.  Eyes:     General:  No scleral icterus.    Conjunctiva/sclera: Conjunctivae normal.  Cardiovascular:     Rate and Rhythm: Normal rate and regular rhythm.     Heart sounds: Murmur heard.     Systolic murmur is present with a grade of 1/6.     No diastolic murmur is present.     No friction rub. No gallop.  Pulmonary:     Effort: Pulmonary effort is normal.     Breath sounds: No stridor. No wheezing, rhonchi or rales.  Abdominal:     General: Abdomen is flat.     Palpations: There is no mass.     Tenderness: There is no abdominal tenderness. There is no guarding.     Hernia: No hernia is present.  Musculoskeletal:        General: No swelling.      Cervical back: Neck supple.     Right lower leg: No edema.     Left lower leg: No edema.  Lymphadenopathy:     Cervical: No cervical adenopathy.  Skin:    General: Skin is warm and dry.  Neurological:     General: No focal deficit present.     Mental Status: She is alert.  Psychiatric:        Mood and Affect: Mood normal.        Behavior: Behavior normal.     Lab Results  Component Value Date   WBC 7.8 09/23/2022   HGB 12.4 09/23/2022   HCT 38.5 09/23/2022   PLT 139.0 (L) 09/23/2022   GLUCOSE 85 09/23/2022   CHOL 194 09/23/2022   TRIG 198.0 (H) 09/23/2022   HDL 63.70 09/23/2022   LDLDIRECT 121.0 06/12/2017   LDLCALC 90 09/23/2022   ALT 9 09/23/2022   AST 14 09/23/2022   NA 141 09/23/2022   K 4.1 09/23/2022   CL 103 09/23/2022   CREATININE 0.70 09/23/2022   BUN 20 09/23/2022   CO2 33 (H) 09/23/2022   TSH 5.13 09/23/2022   INR 1.0 08/11/2020   HGBA1C 4.8 09/19/2021    DG Chest Portable 1 View  Result Date: 11/27/2021 CLINICAL DATA:  Hyperventilation, difficulty breathing EXAM: PORTABLE CHEST 1 VIEW COMPARISON:  08/12/2020 FINDINGS: Cardiomegaly. Both lungs are clear. The visualized skeletal structures are unremarkable. IMPRESSION: Cardiomegaly without acute abnormality of the lungs in AP portable projection. Electronically Signed   By: [name] M.D.   On: 11/27/2021 14:28    Assessment & Plan:   [name] was seen today for annual exam, hypertension, gastroesophageal reflux and osteoarthritis.  Diagnoses and all orders for this visit:  Essential hypertension- Her BP is well controlled. -     Basic metabolic panel; Future -     Hepatic function panel; Future -     TSH; Future -     TSH -     Hepatic function panel -     Basic metabolic panel  Gastroesophageal reflux disease without esophagitis -     CBC with Differential/Platelet; Future -     CBC with Differential/Platelet  Hyperlipidemia with target LDL less than 130- Statin not indicated. -     Lipid  panel; Future -     Hepatic function panel; Future -     TSH; Future -     TSH -     Hepatic function panel -     Lipid panel  Encounter for general adult medical examination with abnormal findings- Exam completed, labs reviewed, vaccines addressed, no cancer screenings indicated, pt ed material  was given.  Primary osteoarthritis involving multiple joints -     HYDROcodone-acetaminophen (NORCO/VICODIN) 5-325 MG tablet; Take 1 tablet by mouth every 8 (eight) hours as needed for moderate pain.  Encounter for palliative care involving management of pain -     HYDROcodone-acetaminophen (NORCO/VICODIN) 5-325 MG tablet; Take 1 tablet by mouth every 8 (eight) hours as needed for moderate pain.   I have discontinued Henri H. Girvan's oxyCODONE. I am also having her start on HYDROcodone-acetaminophen and Shingrix. Additionally, I am having her maintain her Cholecalciferol, Gemtesa, omeprazole, carvedilol, traZODone, torsemide, spironolactone, hydrALAZINE, clonazePAM, promethazine, and albuterol.  Meds ordered this encounter  Medications   HYDROcodone-acetaminophen (NORCO/VICODIN) 5-325 MG tablet    Sig: Take 1 tablet by mouth every 8 (eight) hours as needed for moderate pain.    Dispense:  90 tablet    Refill:  0   Zoster Vaccine Adjuvanted [facility]) injection    Sig: Inject 0.5 mLs into the muscle once for 1 dose.    Dispense:  0.5 mL    Refill:  1     Follow-up: Return in about 6 months (around 03/25/2023).  Scarlette Calico, MD

## 2022-09-24 MED ORDER — HYDROCODONE-ACETAMINOPHEN 5-325 MG PO TABS: 1.0000 | ORAL_TABLET | Freq: Three times a day (TID) | ORAL | 0 refills | Status: DC | PRN

## 2022-09-26 MED ORDER — SHINGRIX 50 MCG/0.5ML IM SUSR: 0.5000 mL | Freq: Once | INTRAMUSCULAR | 1 refills | Status: AC

## 2022-10-02 DIAGNOSIS — R350 Frequency of micturition: Secondary | ICD-10-CM | POA: Diagnosis not present

## 2022-10-02 DIAGNOSIS — N3946 Mixed incontinence: Secondary | ICD-10-CM | POA: Diagnosis not present

## 2022-10-21 ENCOUNTER — Telehealth: Payer: Self-pay | Admitting: Internal Medicine

## 2022-10-21 ENCOUNTER — Other Ambulatory Visit: Payer: Self-pay | Admitting: Internal Medicine

## 2022-10-21 DIAGNOSIS — M159 Polyosteoarthritis, unspecified: Secondary | ICD-10-CM

## 2022-10-21 DIAGNOSIS — Z515 Encounter for palliative care: Secondary | ICD-10-CM

## 2022-10-21 MED ORDER — HYDROCODONE-ACETAMINOPHEN 5-325 MG PO TABS: 1.0000 | ORAL_TABLET | Freq: Three times a day (TID) | ORAL | 0 refills | Status: DC | PRN

## 2022-10-21 NOTE — Telephone Encounter (Signed)
Patient needs her hydrocodone 5 mg.  Please send to [facility] on [address] - Next 12/05/2022  Last visit:  09/23/2022

## 2022-10-22 ENCOUNTER — Other Ambulatory Visit: Payer: Self-pay | Admitting: Internal Medicine

## 2022-10-22 DIAGNOSIS — G4700 Insomnia, unspecified: Secondary | ICD-10-CM

## 2022-10-24 ENCOUNTER — Other Ambulatory Visit: Payer: Self-pay | Admitting: Internal Medicine

## 2022-10-26 ENCOUNTER — Other Ambulatory Visit: Payer: Self-pay | Admitting: Internal Medicine

## 2022-10-26 DIAGNOSIS — I10 Essential (primary) hypertension: Secondary | ICD-10-CM

## 2022-10-26 DIAGNOSIS — I5032 Chronic diastolic (congestive) heart failure: Secondary | ICD-10-CM

## 2022-10-28 ENCOUNTER — Encounter: Payer: Self-pay | Admitting: Pulmonary Disease

## 2022-10-28 ENCOUNTER — Ambulatory Visit: Payer: Medicare HMO | Admitting: Pulmonary Disease

## 2022-10-28 VITALS — BP 130/82 | HR 68 | Temp 98.1°F | Ht 62.0 in | Wt 196.2 lb

## 2022-10-28 DIAGNOSIS — G4733 Obstructive sleep apnea (adult) (pediatric): Secondary | ICD-10-CM | POA: Diagnosis not present

## 2022-10-28 NOTE — Patient Instructions (Signed)
Discontinue CPAP therapy  Continue oxygen use at night at 3 L  Call us with significant concerns  I will see you back in about 6 months  Call with any significant concerns

## 2022-10-28 NOTE — Progress Notes (Signed)
Subjective:    Patient ID: [name], female    DOB: 10-30-1933, 88 y.o.   MRN: [id number]  Patient being seen for history of obstructive sleep apnea  She is feeling really bad with use of CPAP She feels she will not get used to it Keeps up more than helps  Occasionally does use just oxygen at night  Initially diagnosed with obstructive sleep apnea in 2013  She does get short of breath with mild to moderate exertion Feels she needs oxygen supplementation We will do an ambulatory oximetry testing today  Goes to bed about 10-11 PM Takes about 35 minutes to 40 minutes to fall asleep  Wakes up in the morning about 5 AM, takes her few hours to finally get out of bed sometimes up until 10 AM     Past Medical History:  Diagnosis Date   Abdominal pain    Anemia    NOS   Anxiety    Back pain    Bruises easily    Chronic diastolic CHF (congestive heart failure) ([place])    a. 06/2013 EF 65-70%.   Chronic headaches    HISTORY OF    Depression    Diabetes mellitus    type II  DIET CONTROLLED   Diarrhea    Hepatitis    AGE 30S   Hyperlipidemia    Hypertension    Mild aortic stenosis    a. 06/2013 Echo: EF 65-70%, mod LVH with focal basal hypertrophy, very mild AS.   Osteoarthritis    Oxygen desaturation during sleep    USES 2 LITERS BEDTIME   VIA CPAP 06/2012 [facility]    Shortness of breath    WITH EXERTION USES 2 L O2 BEDTIME   Sleep apnea    CPAP WITH O2 2 LITERS 2013 ([facility])   Wears dentures    Wears glasses    Family History  Problem Relation Age of Onset   Cancer Father        prostate cancer   Heart disease Sister    Heart disease Brother    Alcohol abuse Other    Hypertension Other    Kidney disease Other    Mental illness Other    Emphysema Brother    Social History   Socioeconomic History   Marital status: Widowed    Spouse name: Not on file   Number of children: 5   Years of education: Not on file   Highest education level: Not on file   Occupational History   Occupation: retired     Fish farm manager: RETIRED    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[employer] Use   Smoking status: Never   Smokeless tobacco: Never  Vaping Use   Vaping Use: Never used  Substance and Sexual Activity   Alcohol use: No   Drug use: No   Sexual activity: Never  Other Topics Concern   Not on file  Social History Narrative   Not on file   Social Determinants of Health   Financial Resource Strain: Low Risk  (02/01/2022)   Overall Financial Resource Strain (CARDIA)    Difficulty of Paying Living Expenses: Not hard at all  Food Insecurity: No Food Insecurity (07/01/2022)   Hunger Vital Sign    Worried About Running Out of Food in the Last Year: Never true    [place] in the Last Year: Never true  Transportation Needs: No Transportation Needs (07/01/2022)   PRAPARE - Transportation  Lack of Transportation (Medical): No    Lack of Transportation (Non-Medical): No  Physical Activity: Inactive (02/01/2022)   Exercise Vital Sign    Days of Exercise per Week: 0 days    Minutes of Exercise per Session: 0 min  Stress: No Stress Concern Present (02/01/2022)   [place]    Feeling of Stress : Not at all  Social Connections: Socially Isolated (02/01/2022)   Social Connection and Isolation Panel [NHANES]    Frequency of Communication with Friends and Family: Three times a week    Frequency of Social Gatherings with Friends and Family: Three times a week    Attends Religious Services: Never    Active Member of Clubs or Organizations: No    Attends Archivist Meetings: Never    Marital Status: Widowed  Intimate Partner Violence: Not At Risk (02/01/2022)   Humiliation, Afraid, Rape, and Kick questionnaire    Fear of Current or Ex-Partner: No    Emotionally Abused: No    Physically Abused: No    Sexually Abused: No   Review of Systems  Constitutional:  Negative for fever and  unexpected weight change.  HENT:  Positive for congestion and trouble swallowing. Negative for dental problem, ear pain, nosebleeds, postnasal drip, rhinorrhea, sinus pressure, sneezing and sore throat.   Eyes:  Negative for redness and itching.  Respiratory:  Positive for shortness of breath. Negative for cough, chest tightness and wheezing.   Cardiovascular:  Positive for leg swelling. Negative for palpitations.  Gastrointestinal:  Negative for nausea and vomiting.  Genitourinary:  Negative for dysuria.  Musculoskeletal:  Negative for joint swelling.  Skin:  Negative for rash.  Allergic/Immunologic: Negative.  Negative for environmental allergies, food allergies and immunocompromised state.  Neurological:  Negative for headaches.  Hematological:  Bruises/bleeds easily.  Psychiatric/Behavioral:  Negative for dysphoric mood. The patient is nervous/anxious.        Objective:   Physical Exam Constitutional:      Appearance: She is obese.  HENT:     Head: Normocephalic and atraumatic.     Nose: No congestion.  Eyes:     Pupils: Pupils are equal, round, and reactive to light.  Cardiovascular:     Rate and Rhythm: Normal rate and regular rhythm.  Pulmonary:     Effort: Pulmonary effort is normal. No respiratory distress.     Breath sounds: Normal breath sounds. No stridor. No wheezing or rhonchi.  Musculoskeletal:     Cervical back: No rigidity or tenderness.  Skin:    Coloration: Skin is not jaundiced.  Neurological:     Mental Status: She is alert.  Psychiatric:        Mood and Affect: Mood normal.    Vitals:   10/28/22 1355  BP: 130/82  Pulse: 68  SpO2: 97%    CPAP compliance shows 93% use, average use of 3 hours 45 minutes Set between 5 and 15 Residual AHI of 0.7  Assessment & Plan:  .  Nonrestorative sleep .  Daytime sleepiness .  Nocturnal desaturations .  Obstructive sleep apnea  Patient does not feel CPAP is of any benefit to as she is sleeping less with  CPAP than prior  She is aware that oxygen supplementation will not treat sleep disordered breathing but does not want to continue with CPAP any longer  -Placed order to discontinue CPAP -Continue oxygen supplementation at night -Encourage regular activities as tolerated  Follow-up in 6 months  Encouraged to call with any significant concerns

## 2022-11-21 ENCOUNTER — Telehealth: Payer: Self-pay | Admitting: Internal Medicine

## 2022-11-21 ENCOUNTER — Other Ambulatory Visit: Payer: Self-pay | Admitting: Internal Medicine

## 2022-11-21 DIAGNOSIS — M159 Polyosteoarthritis, unspecified: Secondary | ICD-10-CM

## 2022-11-21 DIAGNOSIS — Z515 Encounter for palliative care: Secondary | ICD-10-CM

## 2022-11-21 MED ORDER — HYDROCODONE-ACETAMINOPHEN 5-325 MG PO TABS: 1.0000 | ORAL_TABLET | Freq: Three times a day (TID) | ORAL | 0 refills | Status: DC | PRN

## 2022-11-21 NOTE — Telephone Encounter (Signed)
Caller & Relationship to patient:  self   Call back number: [phone]  Date of last office visit:   Date of next office visit:   Medication(s) to be refilled:  Hydrocodone        Preferred Pharmacy:[facility] on [address]

## 2022-12-03 ENCOUNTER — Encounter: Payer: Self-pay | Admitting: Internal Medicine

## 2022-12-03 ENCOUNTER — Ambulatory Visit (INDEPENDENT_AMBULATORY_CARE_PROVIDER_SITE_OTHER): Payer: Medicare HMO

## 2022-12-03 ENCOUNTER — Ambulatory Visit (INDEPENDENT_AMBULATORY_CARE_PROVIDER_SITE_OTHER): Payer: Medicare HMO | Admitting: Internal Medicine

## 2022-12-03 VITALS — BP 136/84 | HR 81 | Temp 98.1°F | Ht 62.0 in | Wt 197.0 lb

## 2022-12-03 DIAGNOSIS — R0602 Shortness of breath: Secondary | ICD-10-CM | POA: Insufficient documentation

## 2022-12-03 DIAGNOSIS — U071 COVID-19: Secondary | ICD-10-CM | POA: Insufficient documentation

## 2022-12-03 DIAGNOSIS — R052 Subacute cough: Secondary | ICD-10-CM

## 2022-12-03 DIAGNOSIS — R0603 Acute respiratory distress: Secondary | ICD-10-CM | POA: Insufficient documentation

## 2022-12-03 DIAGNOSIS — R059 Cough, unspecified: Secondary | ICD-10-CM | POA: Diagnosis not present

## 2022-12-03 LAB — POC COVID19 BINAXNOW: SARS Coronavirus 2 Ag: POSITIVE — AB

## 2022-12-03 NOTE — Progress Notes (Signed)
Subjective:  Patient ID: [name], female    DOB: [birth year]  Age: 89 y.o. MRN: [id number]  CC: Cough   HPI [name] presents for f/up -  She complains of a 5 day history of cough productive of "pink" phlegm with CP and SOB.  Outpatient Medications Prior to Visit  Medication Sig Dispense Refill   albuterol (PROVENTIL) (2.5 MG/3ML) 0.083% nebulizer solution Take 3 mLs (2.5 mg total) by nebulization every 6 (six) hours as needed for wheezing or shortness of breath. 225 mL 3   carvedilol (COREG) 12.5 MG tablet TAKE 1 TABLET(12.5 MG) BY MOUTH TWICE DAILY WITH A MEAL 180 tablet 1   Cholecalciferol 2000 units TABS Take 2 tablets (4,000 Units total) by mouth daily. 180 tablet 1   clonazePAM (KLONOPIN) 1 MG tablet TAKE 1 TABLET(1 MG) BY MOUTH TWICE DAILY AS NEEDED FOR ANXIETY 60 tablet 5   hydrALAZINE (APRESOLINE) 50 MG tablet TAKE 1 TABLET(50 MG) BY MOUTH TWICE DAILY 180 tablet 1   HYDROcodone-acetaminophen (NORCO/VICODIN) 5-325 MG tablet Take 1 tablet by mouth every 8 (eight) hours as needed for moderate pain. 90 tablet 0   omeprazole (PRILOSEC) 40 MG capsule TAKE 1 CAPSULE(40 MG) BY MOUTH DAILY 90 capsule 1   promethazine (PHENERGAN) 12.5 MG tablet TAKE 1 TABLET(12.5 MG) BY MOUTH EVERY 8 HOURS AS NEEDED FOR NAUSEA OR VOMITING 20 tablet 5   spironolactone (ALDACTONE) 25 MG tablet TAKE 1/2 TABLET(12.5 MG) BY MOUTH DAILY 45 tablet 1   torsemide (DEMADEX) 20 MG tablet TAKE 1 TABLET BY MOUTH EVERY DAY 90 tablet 1   traZODone (DESYREL) 100 MG tablet TAKE 1 TABLET(100 MG) BY MOUTH AT BEDTIME 90 tablet 1   Vibegron (GEMTESA) 75 MG TABS Take 75 mg by mouth daily.     No facility-administered medications prior to visit.    ROS Review of Systems  Constitutional:  Negative for chills, diaphoresis, fatigue and fever.  HENT:  Positive for sore throat.   Respiratory:  Positive for cough, shortness of breath and wheezing. Negative for chest tightness and stridor.   Cardiovascular:  Positive  for chest pain. Negative for palpitations and leg swelling.  Gastrointestinal:  Negative for abdominal pain, diarrhea, nausea and vomiting.  Genitourinary: Negative.  Negative for difficulty urinating.  Musculoskeletal: Negative.   Skin: Negative.   Neurological: Negative.  Negative for dizziness and weakness.  Hematological:  Negative for adenopathy. Does not bruise/bleed easily.  Psychiatric/Behavioral: Negative.      Objective:  BP 136/84 (BP Location: Right Arm, Patient Position: Sitting, Cuff Size: Large)   Pulse 81   Temp 98.1 F (36.7 C) (Oral)   Ht '5\' 2"'$  (1.575 m)   Wt 197 lb (89.4 kg)   SpO2 91%   BMI 36.03 kg/m   BP Readings from Last 3 Encounters:  12/03/22 136/84  10/28/22 130/82  09/23/22 138/66    Wt Readings from Last 3 Encounters:  12/03/22 197 lb (89.4 kg)  10/28/22 196 lb 3.2 oz (89 kg)  09/23/22 163 lb (73.9 kg)    Physical Exam Vitals reviewed.  Constitutional:      General: She is not in acute distress.    Appearance: She is ill-appearing and diaphoretic. She is not toxic-appearing.  HENT:     Mouth/Throat:     Mouth: Mucous membranes are moist.  Eyes:     General: No scleral icterus.    Conjunctiva/sclera: Conjunctivae normal.  Cardiovascular:     Rate and Rhythm: Normal rate and regular rhythm.  Heart sounds: No murmur heard. Pulmonary:     Effort: Tachypnea, accessory muscle usage and respiratory distress present.     Breath sounds: Examination of the right-upper field reveals rhonchi. Examination of the left-upper field reveals rhonchi. Examination of the right-middle field reveals rhonchi. Examination of the left-middle field reveals rhonchi. Examination of the right-lower field reveals rhonchi. Examination of the left-lower field reveals rhonchi. Rhonchi present. No rales.  Abdominal:     General: Abdomen is flat.     Palpations: There is no mass.     Tenderness: There is no abdominal tenderness. There is no guarding.     Hernia: No  hernia is present.  Musculoskeletal:        General: Normal range of motion.     Cervical back: Neck supple.     Right lower leg: No edema.     Left lower leg: No edema.  Skin:    General: Skin is warm.  Neurological:     General: No focal deficit present.     Mental Status: She is alert. Mental status is at baseline.  Psychiatric:        Mood and Affect: Mood normal.        Behavior: Behavior normal.     Lab Results  Component Value Date   WBC 7.8 09/23/2022   HGB 12.4 09/23/2022   HCT 38.5 09/23/2022   PLT 139.0 (L) 09/23/2022   GLUCOSE 85 09/23/2022   CHOL 194 09/23/2022   TRIG 198.0 (H) 09/23/2022   HDL 63.70 09/23/2022   LDLDIRECT 121.0 06/12/2017   LDLCALC 90 09/23/2022   ALT 9 09/23/2022   AST 14 09/23/2022   NA 141 09/23/2022   K 4.1 09/23/2022   CL 103 09/23/2022   CREATININE 0.70 09/23/2022   BUN 20 09/23/2022   CO2 33 (H) 09/23/2022   TSH 5.13 09/23/2022   INR 1.0 08/11/2020   HGBA1C 4.8 09/19/2021    DG Chest Portable 1 View  Result Date: 11/27/2021 CLINICAL DATA:  Hyperventilation, difficulty breathing EXAM: PORTABLE CHEST 1 VIEW COMPARISON:  08/12/2020 FINDINGS: Cardiomegaly. Both lungs are clear. The visualized skeletal structures are unremarkable. IMPRESSION: Cardiomegaly without acute abnormality of the lungs in AP portable projection. Electronically Signed   By: Delanna Ahmadi M.D.   On: 11/27/2021 14:28   DG Chest 2 View  Result Date: 12/03/2022 CLINICAL DATA:  Five day history of cough. EXAM: CHEST - 2 VIEW COMPARISON:  11/27/2021 FINDINGS: The cardiac silhouette, mediastinal hilar contours are within normal limits given the patient's age. No infiltrates, edema or effusions. No pulmonary lesions. The bony thorax is intact. IMPRESSION: No acute cardiopulmonary findings. Electronically Signed   By: Marijo Sanes M.D.   On: 12/03/2022 15:02      Assessment & Plan:   [name] was seen today for cough.  Diagnoses and all orders for this visit:  Subacute  cough -     DG Chest 2 View; Future -     POC COVID-19  SOB (shortness of breath) -     DG Chest 2 View; Future -     POC COVID-19  COVID- She has presented too late to benefit from oral antiviral therapy.  Her chest x-ray is negative for infiltrate.  She is in respiratory distress so I recommended that she be transported to the ED to consider supportive measures with oxygen and possibly IV steroids. -     POC COVID-19  Respiratory distress   I am having Harlie H. Lisenby maintain  her Cholecalciferol, Gemtesa, omeprazole, carvedilol, spironolactone, hydrALAZINE, promethazine, albuterol, traZODone, clonazePAM, torsemide, and HYDROcodone-acetaminophen.  No orders of the defined types were placed in this encounter.    Follow-up: No follow-ups on file.  Scarlette Calico, MD

## 2022-12-05 ENCOUNTER — Ambulatory Visit: Payer: Medicare HMO | Admitting: Internal Medicine

## 2022-12-12 NOTE — Progress Notes (Shared)
[facility] Note  12/18/2022     CHIEF COMPLAINT Patient presents for No chief complaint on file.   HISTORY OF PRESENT ILLNESS: [name] is a 89 y.o. female who presents to the clinic today for:     Referring physician: [name], MD [place],  [place] [id number]  HISTORICAL INFORMATION:   Selected notes from the MEDICAL RECORD NUMBER Referred by Dr. Lucianne Lei LEE:  Ocular Hx- PMH-    CURRENT MEDICATIONS: No current outpatient medications on file. (Ophthalmic Drugs)   No current facility-administered medications for this visit. (Ophthalmic Drugs)   Current Outpatient Medications (Other)  Medication Sig   albuterol (PROVENTIL) (2.5 MG/3ML) 0.083% nebulizer solution Take 3 mLs (2.5 mg total) by nebulization every 6 (six) hours as needed for wheezing or shortness of breath.   carvedilol (COREG) 12.5 MG tablet TAKE 1 TABLET(12.5 MG) BY MOUTH TWICE DAILY WITH A MEAL   Cholecalciferol 2000 units TABS Take 2 tablets (4,000 Units total) by mouth daily.   clonazePAM (KLONOPIN) 1 MG tablet TAKE 1 TABLET(1 MG) BY MOUTH TWICE DAILY AS NEEDED FOR ANXIETY   hydrALAZINE (APRESOLINE) 50 MG tablet TAKE 1 TABLET(50 MG) BY MOUTH TWICE DAILY   HYDROcodone-acetaminophen (NORCO/VICODIN) 5-325 MG tablet Take 1 tablet by mouth every 8 (eight) hours as needed for moderate pain.   omeprazole (PRILOSEC) 40 MG capsule TAKE 1 CAPSULE(40 MG) BY MOUTH DAILY   promethazine (PHENERGAN) 12.5 MG tablet TAKE 1 TABLET(12.5 MG) BY MOUTH EVERY 8 HOURS AS NEEDED FOR NAUSEA OR VOMITING   spironolactone (ALDACTONE) 25 MG tablet TAKE 1/2 TABLET(12.5 MG) BY MOUTH DAILY   torsemide (DEMADEX) 20 MG tablet TAKE 1 TABLET BY MOUTH EVERY DAY   traZODone (DESYREL) 100 MG tablet TAKE 1 TABLET(100 MG) BY MOUTH AT BEDTIME   Vibegron (GEMTESA) 75 MG TABS Take 75 mg by mouth daily.   No current facility-administered medications for this visit. (Other)   REVIEW OF  SYSTEMS:   ALLERGIES Allergies  Allergen Reactions   Amlodipine Other (See Comments)    Reaction:  Dizziness    Enalapril Cough   Losartan Potassium Other (See Comments)    Reaction:  GI pain    Metformin Diarrhea   Morphine And Related Itching and Nausea And Vomiting   PAST MEDICAL HISTORY Past Medical History:  Diagnosis Date   Abdominal pain    Anemia    NOS   Anxiety    Back pain    Bruises easily    Chronic diastolic CHF (congestive heart failure) ([place])    a. 06/2013 EF 65-70%.   Chronic headaches    HISTORY OF    Depression    Diabetes mellitus    type II  DIET CONTROLLED   Diarrhea    Hepatitis    AGE 30S   Hyperlipidemia    Hypertension    Mild aortic stenosis    a. 06/2013 Echo: EF 65-70%, mod LVH with focal basal hypertrophy, very mild AS.   Osteoarthritis    Oxygen desaturation during sleep    USES 2 LITERS BEDTIME   VIA CPAP 06/2012 [facility]    Shortness of breath    WITH EXERTION USES 2 L O2 BEDTIME   Sleep apnea    CPAP WITH O2 2 LITERS 2013 ([facility])   Wears dentures    Wears glasses    Past Surgical History:  Procedure Laterality Date   ABDOMINAL HYSTERECTOMY     APPENDECTOMY  CHOLECYSTECTOMY     JOINT REPLACEMENT     LEFT KNEE    KNEE ARTHROPLASTY Right 03/22/2015   Procedure: COMPUTER ASSISTED TOTAL KNEE ARTHROPLASTY;  Surgeon: Marybelle Killings, MD;  Location: [place];  Service: Orthopedics;  Laterality: Right;   KNEE ARTHROSCOPY     RIGHT   LEFT AND RIGHT HEART CATHETERIZATION WITH CORONARY ANGIOGRAM N/A 10/28/2013   Procedure: LEFT AND RIGHT HEART CATHETERIZATION WITH CORONARY ANGIOGRAM;  Surgeon: Blane Ohara, MD;  [facility];  Service: Cardiovascular;  Laterality: N/A;   LEFT HEART CATHETERIZATION WITH CORONARY ANGIOGRAM N/A 11/14/2014   Procedure: LEFT HEART CATHETERIZATION WITH CORONARY ANGIOGRAM;  Surgeon: Josue Hector, MD;  Location: [facility];  Service: Cardiovascular;  Laterality: N/A;   REVISION TOTAL KNEE  ARTHROPLASTY  2011   ROTATOR CUFF REPAIR  2010   SHOULDER ARTHROSCOPY  09/17/2012   Procedure: ARTHROSCOPY SHOULDER;  Surgeon: Sharmon Revere, MD;  Location: [facility];  Service: Orthopedics;  Laterality: Left;   TONSILLECTOMY     TONSILLECTOMY     FAMILY HISTORY Family History  Problem Relation Age of Onset   Cancer Father        prostate cancer   Heart disease Sister    Heart disease Brother    Alcohol abuse Other    Hypertension Other    Kidney disease Other    Mental illness Other    Emphysema Brother    SOCIAL HISTORY Social History   Tobacco Use   Smoking status: Never   Smokeless tobacco: Never  Vaping Use   Vaping Use: Never used  Substance Use Topics   Alcohol use: No   Drug use: No       OPHTHALMIC EXAM:  Not recorded     IMAGING AND PROCEDURES  Imaging and Procedures for 12/18/2022          ASSESSMENT/PLAN:  No diagnosis found.  1,2. 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 Hypertensive retinopathy OU - discussed importance of tight BP control and likely relation to #1,2 above - monitor  5. Diabetes mellitus, type 2 without retinopathy  - A1c 4.8 on 10.26.22 - The incidence, risk factors for progression, natural history and treatment options for diabetic retinopathy  were discussed with patient.   - The need for close monitoring of blood glucose, blood pressure, and serum lipids, avoiding cigarette or any type of tobacco, and  the need for long term follow up was also discussed with patient. - monitor  6. Pseudophakia OU  - s/p CE/IOL OU  - IOLs in good position, doing well  - monitor  Ophthalmic Meds Ordered this visit:  No orders of the defined types were placed in this encounter.    No follow-ups on file.  There are no Patient Instructions on file for this visit.   Explained the diagnoses, plan, and follow up with the patient and they expressed understanding.  Patient expressed understanding of the importance of proper follow up care.   This document serves as a record of services personally performed by Gardiner Sleeper, MD, PhD. It was created on their behalf by Orvan Falconer, an ophthalmic technician. The creation of this record is the provider's dictation and/or activities 238during the visit.    Electronically signed by: Orvan Falconer, OA, 12/12/22  10:25 AM    Gardiner Sleeper, M.D., Ph.D. Diseases & Surgery of the Retina and Vitreous [facility]: M myopia (nearsighted); A astigmatism; H hyperopia (farsighted); P presbyopia; Mrx spectacle prescription;  CTL contact lenses; OD right eye; OS left eye; OU both eyes  XT exotropia; ET esotropia; PEK punctate epithelial keratitis; PEE punctate epithelial erosions; DES dry eye syndrome; MGD meibomian gland dysfunction; ATs artificial tears; PFAT's preservative free artificial tears; [place] nuclear sclerotic cataract; PSC posterior subcapsular cataract; ERM epi-retinal membrane; PVD posterior vitreous detachment; RD retinal detachment; DM diabetes mellitus; DR diabetic retinopathy; NPDR non-proliferative diabetic retinopathy; PDR proliferative diabetic retinopathy; CSME clinically significant macular edema; DME diabetic macular edema; dbh dot blot hemorrhages; CWS cotton wool spot; POAG primary open angle glaucoma; C/D cup-to-disc ratio; HVF humphrey visual field; GVF goldmann visual field; OCT optical coherence  tomography; IOP intraocular pressure; BRVO Branch retinal vein occlusion; CRVO central retinal vein occlusion; CRAO central retinal artery occlusion; BRAO branch retinal artery occlusion; RT retinal tear; SB scleral buckle; PPV pars plana vitrectomy; VH Vitreous hemorrhage; PRP panretinal laser photocoagulation; IVK intravitreal kenalog; VMT vitreomacular traction; MH Macular hole;  NVD neovascularization of the disc; NVE neovascularization elsewhere; AREDS age related eye disease study; ARMD age related macular degeneration; POAG primary open angle glaucoma; EBMD epithelial/anterior basement membrane dystrophy; ACIOL anterior chamber intraocular lens; IOL intraocular lens; PCIOL posterior chamber intraocular lens; Phaco/IOL phacoemulsification with intraocular lens placement; [place] photorefractive keratectomy; LASIK laser assisted in situ keratomileusis; HTN hypertension; DM diabetes mellitus; COPD chronic obstructive pulmonary disease

## 2022-12-18 ENCOUNTER — Encounter (INDEPENDENT_AMBULATORY_CARE_PROVIDER_SITE_OTHER): Payer: Medicare HMO | Admitting: Ophthalmology

## 2022-12-20 ENCOUNTER — Other Ambulatory Visit: Payer: Self-pay | Admitting: Internal Medicine

## 2022-12-20 ENCOUNTER — Telehealth: Payer: Self-pay | Admitting: Internal Medicine

## 2022-12-20 DIAGNOSIS — M159 Polyosteoarthritis, unspecified: Secondary | ICD-10-CM

## 2022-12-20 DIAGNOSIS — Z515 Encounter for palliative care: Secondary | ICD-10-CM

## 2022-12-20 MED ORDER — HYDROCODONE-ACETAMINOPHEN 5-325 MG PO TABS: 1.0000 | ORAL_TABLET | Freq: Three times a day (TID) | ORAL | 0 refills | Status: DC | PRN

## 2022-12-20 NOTE — Telephone Encounter (Signed)
Caller & Relationship to patient: Self  Call back number: [phone]   Date of last office visit: 1.9.24  Date of next office visit: 4.30.24  Medication(s) to be refilled:  HYDROcodone-acetaminophen [facility]) 10-325 MG tablet   Preferred Pharmacy:  [facility] [phone]   Phone: [phone]  Fax: [phone]

## 2023-01-08 DIAGNOSIS — H903 Sensorineural hearing loss, bilateral: Secondary | ICD-10-CM | POA: Diagnosis not present

## 2023-01-08 DIAGNOSIS — Z570 Occupational exposure to noise: Secondary | ICD-10-CM | POA: Diagnosis not present

## 2023-01-17 ENCOUNTER — Telehealth: Payer: Self-pay | Admitting: Internal Medicine

## 2023-01-17 DIAGNOSIS — M159 Polyosteoarthritis, unspecified: Secondary | ICD-10-CM

## 2023-01-17 DIAGNOSIS — Z515 Encounter for palliative care: Secondary | ICD-10-CM

## 2023-01-17 MED ORDER — HYDROCODONE-ACETAMINOPHEN 5-325 MG PO TABS: 1.0000 | ORAL_TABLET | Freq: Three times a day (TID) | ORAL | 0 refills | Status: DC | PRN

## 2023-01-17 NOTE — Telephone Encounter (Signed)
Caller & Relationship to patient:  self    Call back number:  [phone]   Date of last office visit:   Date of next office visit:  03/25/2023   Medication(s) to be refilled:  Hydrocodone        Preferred Pharmacy:  [facility] on [address]

## 2023-01-22 ENCOUNTER — Telehealth: Payer: Self-pay

## 2023-01-22 NOTE — Telephone Encounter (Signed)
Contacted [place] to schedule their annual wellness visit. Appointment made for 02/05/23.  Norton Blizzard, [place] ([facility])  [place] [facility] [phone]

## 2023-02-04 ENCOUNTER — Ambulatory Visit (INDEPENDENT_AMBULATORY_CARE_PROVIDER_SITE_OTHER): Payer: Medicare HMO | Admitting: Cardiology

## 2023-02-04 ENCOUNTER — Encounter (HOSPITAL_BASED_OUTPATIENT_CLINIC_OR_DEPARTMENT_OTHER): Payer: Self-pay | Admitting: Cardiology

## 2023-02-04 VITALS — BP 122/60 | HR 70 | Ht 62.0 in | Wt 201.0 lb

## 2023-02-04 DIAGNOSIS — I350 Nonrheumatic aortic (valve) stenosis: Secondary | ICD-10-CM | POA: Diagnosis not present

## 2023-02-04 DIAGNOSIS — I5032 Chronic diastolic (congestive) heart failure: Secondary | ICD-10-CM

## 2023-02-04 DIAGNOSIS — I10 Essential (primary) hypertension: Secondary | ICD-10-CM

## 2023-02-04 DIAGNOSIS — U099 Post covid-19 condition, unspecified: Secondary | ICD-10-CM | POA: Diagnosis not present

## 2023-02-04 DIAGNOSIS — R0602 Shortness of breath: Secondary | ICD-10-CM | POA: Diagnosis not present

## 2023-02-04 DIAGNOSIS — R0609 Other forms of dyspnea: Secondary | ICD-10-CM | POA: Diagnosis not present

## 2023-02-04 DIAGNOSIS — G4733 Obstructive sleep apnea (adult) (pediatric): Secondary | ICD-10-CM | POA: Diagnosis not present

## 2023-02-04 NOTE — Progress Notes (Signed)
Cardiology Office Note:    Date:  02/04/2023   ID:  [name], DOB 01-Jan-1933, MRN [id number]  PCP:  [name], MD  Cardiologist:  [name], MD   Referring MD: [name], MD   CC: follow up  History of Present Illness:    [name] is a 89 y.o. female with a hx of chronic diastolic heart failure, hypertension who is seen for follow up today. I initially met her 07/16/19 as a new consult at the request of [name], MD for the evaluation and management of aortic stenosis and concern for diastolic heart failure.  Last visit she had been suffering from a UTI and had became short of breath. She used oxygen at night when at home. She monitors her at home blood pressures routinely. She denied any very high or low readings. She has noticed having less of an appetite. However, she does believe she is eating and drinking adequately.  Today, the patient states that she hasn't been doing much recently. She has had no energy or appetite since January when she had COVID for a few days. She is often dizzy and light headed. She believes it has not improved in the two months since. She has felt pre-syncopal and had to sit down and administer oxygen multiple times. She often has shortness of breath with exertion which she believed slightly improved before she had gotten COVID.  She hasn't been compliant with CPAP due to not being able to catch her breath at night. She now uses oxygen instead when walking and sleeping.  She sometimes gets chest pain when laying the wrong way in bed but she attributes this to muscle soreness.  She notes some numbness in her left shoulder.  She denies any palpitations,  or peripheral edema. No headaches orthopnea, or PND.  Past Medical History:  Diagnosis Date   Abdominal pain    Anemia    NOS   Anxiety    Back pain    Bruises easily    Chronic diastolic CHF (congestive heart failure) ([place])    a. 06/2013 EF 65-70%.   Chronic  headaches    HISTORY OF    Depression    Diabetes mellitus    type II  DIET CONTROLLED   Diarrhea    Hepatitis    AGE 30S   Hyperlipidemia    Hypertension    Mild aortic stenosis    a. 06/2013 Echo: EF 65-70%, mod LVH with focal basal hypertrophy, very mild AS.   Osteoarthritis    Oxygen desaturation during sleep    USES 2 LITERS BEDTIME   VIA CPAP 06/2012 [facility]    Shortness of breath    WITH EXERTION USES 2 L O2 BEDTIME   Sleep apnea    CPAP WITH O2 2 LITERS 2013 ([facility])   Wears dentures    Wears glasses     Past Surgical History:  Procedure Laterality Date   ABDOMINAL HYSTERECTOMY     APPENDECTOMY     CHOLECYSTECTOMY     JOINT REPLACEMENT     LEFT KNEE    KNEE ARTHROPLASTY Right 03/22/2015   Procedure: COMPUTER ASSISTED TOTAL KNEE ARTHROPLASTY;  Surgeon: Marybelle Killings, MD;  Location: [place];  Service: Orthopedics;  Laterality: Right;   KNEE ARTHROSCOPY     RIGHT   LEFT AND RIGHT HEART CATHETERIZATION WITH CORONARY ANGIOGRAM N/A 10/28/2013   Procedure: LEFT AND RIGHT HEART CATHETERIZATION WITH CORONARY ANGIOGRAM;  Surgeon:  Blane Ohara, MD;  Location: [facility] CATH LAB;  Service: Cardiovascular;  Laterality: N/A;   LEFT HEART CATHETERIZATION WITH CORONARY ANGIOGRAM N/A 11/14/2014   Procedure: LEFT HEART CATHETERIZATION WITH CORONARY ANGIOGRAM;  Surgeon: Josue Hector, MD;  Location: [facility] CATH LAB;  Service: Cardiovascular;  Laterality: N/A;   REVISION TOTAL KNEE ARTHROPLASTY  2011   ROTATOR CUFF REPAIR  2010   SHOULDER ARTHROSCOPY  09/17/2012   Procedure: ARTHROSCOPY SHOULDER;  Surgeon: Sharmon Revere, MD;  Location: [place];  Service: Orthopedics;  Laterality: Left;   TONSILLECTOMY     TONSILLECTOMY      Current Medications: Current Outpatient Medications on File Prior to Visit  Medication Sig   albuterol (PROVENTIL) (2.5 MG/3ML) 0.083% nebulizer solution Take 3 mLs (2.5 mg total) by nebulization every 6 (six) hours as needed for wheezing or shortness of breath.    carvedilol (COREG) 12.5 MG tablet TAKE 1 TABLET(12.5 MG) BY MOUTH TWICE DAILY WITH A MEAL   Cholecalciferol 2000 units TABS Take 2 tablets (4,000 Units total) by mouth daily.   clonazePAM (KLONOPIN) 1 MG tablet TAKE 1 TABLET(1 MG) BY MOUTH TWICE DAILY AS NEEDED FOR ANXIETY   hydrALAZINE (APRESOLINE) 50 MG tablet TAKE 1 TABLET(50 MG) BY MOUTH TWICE DAILY   HYDROcodone-acetaminophen (NORCO/VICODIN) 5-325 MG tablet Take 1 tablet by mouth every 8 (eight) hours as needed for moderate pain.   omeprazole (PRILOSEC) 40 MG capsule TAKE 1 CAPSULE(40 MG) BY MOUTH DAILY   promethazine (PHENERGAN) 12.5 MG tablet TAKE 1 TABLET(12.5 MG) BY MOUTH EVERY 8 HOURS AS NEEDED FOR NAUSEA OR VOMITING   spironolactone (ALDACTONE) 25 MG tablet TAKE 1/2 TABLET(12.5 MG) BY MOUTH DAILY   torsemide (DEMADEX) 20 MG tablet TAKE 1 TABLET BY MOUTH EVERY DAY   traZODone (DESYREL) 100 MG tablet TAKE 1 TABLET(100 MG) BY MOUTH AT BEDTIME   Vibegron (GEMTESA) 75 MG TABS Take 75 mg by mouth daily.   No current facility-administered medications on file prior to visit.     Allergies:   Amlodipine, Enalapril, Losartan potassium, Metformin, and Morphine and related   Social History   Tobacco Use   Smoking status: Never   Smokeless tobacco: Never  Vaping Use   Vaping Use: Never used  Substance Use Topics   Alcohol use: No   Drug use: No    Family History: The patient's family history includes Alcohol abuse in an other family member; Cancer in her father; Emphysema in her brother; Heart disease in her brother and sister; Hypertension in an other family member; Kidney disease in an other family member; Mental illness in an other family member.  ROS:   Please see the history of present illness.   (+) Dizziness (+) Lightheadedness (+) Lethargy (+) SOB with exertion (+) Chest Pain (likely musculoskeletal) (+) Pre-Syncope (+) Left shoulder numbness Additional pertinent ROS otherwise unremarkable  EKGs/Labs/Other Studies  Reviewed:    The following studies were reviewed today:  CT Abdomen 04/28/21 IMPRESSION: 1. Descending and sigmoid colon diverticulosis with mild sigmoid colon diverticulitis. 2. Scattered diverticula of the distal ileum, without inflammatory findings along the distal ileum. 3. Other imaging findings of potential clinical significance: Aortic Atherosclerosis (ICD10-I70.0). Mild cardiomegaly. Chronically stable nonspecific right adrenal mass. Bilateral chronic hip arthropathy with chondrocalcinosis. Small bilateral groin hernias contain adipose tissue.  Echo 07/22/19  1. The left ventricle has normal systolic function with an ejection fraction of 60-65%. The cavity size was normal. Severe basal septal hypertrophy. Left ventricular diastolic Doppler parameters are consistent with pseudonormalization.  2. The right ventricle has normal systolic function. The cavity was normal. There is no increase in right ventricular wall thickness. Right ventricular systolic pressure could not be assessed.  3. There is mild mitral annular calcification present.  4. The aortic valve is tricuspid. Moderate calcification of the aortic valve. Aortic valve regurgitation is trivial by color flow Doppler.  5. The aorta is normal unless otherwise noted.  6. The interatrial septum appears to be lipomatous.  EKG:  EKG is personally reviewed. 02/04/2023: not ordered today 08/15/2022:  sinus bradycardia at 59 bpm, unchanged ST/T pattern 08/02/21 sinus bradycardia at 57 bpm, unchanged ST/T pattern 08/11/20 sinus rhythm at 67 bpm, nonspecific ST/T pattern anterolateral leads  Recent Labs: 09/23/2022: ALT 9; BUN 20; Creatinine, Ser 0.70; Hemoglobin 12.4; Platelets 139.0; Potassium 4.1; Sodium 141; TSH 5.13   Recent Lipid Panel    Component Value Date/Time   CHOL 194 09/23/2022 1350   TRIG 198.0 (H) 09/23/2022 1350   HDL 63.70 09/23/2022 1350   CHOLHDL 3 09/23/2022 1350   VLDL 39.6 09/23/2022 1350   LDLCALC 90  09/23/2022 1350   LDLDIRECT 121.0 06/12/2017 1349    Physical Exam:    VS:  BP 122/60   Pulse 70   Ht 5\' 2"  (1.575 m)   Wt 201 lb (91.2 kg)   BMI 36.76 kg/m    Wt Readings from Last 3 Encounters:  02/07/23 200 lb (90.7 kg)  02/05/23 201 lb (91.2 kg)  02/04/23 201 lb (91.2 kg)   GEN: Well nourished, well developed in no acute distress HEENT: Normal, moist mucous membranes NECK: No JVD CARDIAC: regular rhythm, normal S1 and S2, no rubs or gallops. 2/6 systolic murmur. VASCULAR: Radial and DP pulses 2+ bilaterally. No carotid bruits RESPIRATORY:  velcro crackles without rales, wheezing or rhonchi  ABDOMEN: Soft, non-tender, non-distended MUSCULOSKELETAL:  Ambulates independently SKIN: Warm and dry, no edema NEUROLOGIC:  Alert and oriented x 3. No focal neuro deficits noted. PSYCHIATRIC:  Normal affect    ASSESSMENT:    1. SOB (shortness of breath)   2. Essential hypertension   3. OSA (obstructive sleep apnea)   4. Post-COVID chronic dyspnea   5. Chronic diastolic CHF (congestive heart failure) ([place])   6. Aortic stenosis, mild     PLAN:    Chronic diastolic heart failure Shortness of breath -appears euvolemic on exam -reviewed heart failure education, including daily weights, salt avoidance, fluid restrictions -doing much better on daily torsemide vs PRN, continue daily -no chest pain, but if symptoms worsen could consider repeat stress test. Declines at this time. -on home O2 with exertion and sleep, has been persistent since covid  Hypertension: prior hospitalization for hypertensive urgency 07/2020 -wants to cut back on meds. BP well controlled today. Will trial cutting back on the hydralazine from 50 mg to 25 mg BID and see if she feels better -continue carvedilol 12.5 mg BID. Avoid bradycardia due to diastolic dysfunction -continue spirinolactone 12.5 mg daily -continue torsemide 20 mg daily -cannot tolerate amlodipine, ACEi/ARB, see allergies  Murmur: aortic  sclerosis with mild stenosis. Unchanged on exam today  OSA: not tolerating CPAP, on home O2  Plan for follow up: 6 months  [name], MD, PhD, [facility]   Medication Adjustments/Labs and Tests Ordered: Current medicines are reviewed at length with the patient today.  Concerns regarding medicines are outlined above.   No orders of the defined types were placed in this encounter.  No orders of the defined  types were placed in this encounter.  Patient Instructions  Medication Instructions:   Your blood pressure has been very high in the past, and we want to avoid this. But, if you want to try to cut back on the medication, you can try cutting the hydralazine in half (going from 50 mg twice a day to 25 mg twice a day). We want your blood pressure to be less than 130/80, so if you seen numbers in the 140s or higher please go back to the hydralazine 50 mg twice a day.  *If you need a refill on your cardiac medications before your next appointment, please call your pharmacy*   Lab Work: None   Testing/Procedures: None   Follow-Up: At [facility], you and your health needs are our priority.  As part of our continuing mission to provide you with exceptional heart care, we have created designated Provider Care Teams.  These Care Teams include your primary Cardiologist (physician) and Advanced Practice Providers (APPs -  Physician Assistants and Nurse Practitioners) who all work together to provide you with the care you need, when you need it.  We recommend signing up for the patient portal called "MyChart".  Sign up information is provided on this After Visit Summary.  MyChart is used to connect with patients for Virtual Visits (Telemedicine).  Patients are able to view lab/test results, encounter notes, upcoming appointments, etc.  Non-urgent messages can be sent to your provider as well.   To learn more about what you can do with MyChart, go  to [email or web address].    Your next appointment:   6 month(s)  Provider:   [name], MD    Other Instructions None         I,Coren O'Brien,acting as a scribe for [name], MD.,have documented all relevant documentation on the behalf of [name], MD,as directed by  [name], MD while in the presence of [name], MD.  I, [name], MD, have reviewed all documentation for this visit. The documentation on 02/18/23 for the exam, diagnosis, procedures, and orders are all accurate and complete.

## 2023-02-04 NOTE — Patient Instructions (Addendum)
Medication Instructions:   Your blood pressure has been very high in the past, and we want to avoid this. But, if you want to try to cut back on the medication, you can try cutting the hydralazine in half (going from 50 mg twice a day to 25 mg twice a day). We want your blood pressure to be less than 130/80, so if you seen numbers in the 140s or higher please go back to the hydralazine 50 mg twice a day.  *If you need a refill on your cardiac medications before your next appointment, please call your pharmacy*   Lab Work: None   Testing/Procedures: None   Follow-Up: At [facility], you and your health needs are our priority.  As part of our continuing mission to provide you with exceptional heart care, we have created designated Provider Care Teams.  These Care Teams include your primary Cardiologist (physician) and Advanced Practice Providers (APPs -  Physician Assistants and Nurse Practitioners) who all work together to provide you with the care you need, when you need it.  We recommend signing up for the patient portal called "MyChart".  Sign up information is provided on this After Visit Summary.  MyChart is used to connect with patients for Virtual Visits (Telemedicine).  Patients are able to view lab/test results, encounter notes, upcoming appointments, etc.  Non-urgent messages can be sent to your provider as well.   To learn more about what you can do with MyChart, go to [email or web address].    Your next appointment:   6 month(s)  Provider:   [name], MD    Other Instructions None

## 2023-02-05 ENCOUNTER — Telehealth: Payer: Self-pay

## 2023-02-05 ENCOUNTER — Other Ambulatory Visit: Payer: Self-pay | Admitting: Internal Medicine

## 2023-02-05 ENCOUNTER — Ambulatory Visit (INDEPENDENT_AMBULATORY_CARE_PROVIDER_SITE_OTHER): Payer: Medicare HMO

## 2023-02-05 VITALS — Ht 62.0 in | Wt 201.0 lb

## 2023-02-05 DIAGNOSIS — Z0000 Encounter for general adult medical examination without abnormal findings: Secondary | ICD-10-CM | POA: Diagnosis not present

## 2023-02-05 DIAGNOSIS — Z515 Encounter for palliative care: Secondary | ICD-10-CM

## 2023-02-05 DIAGNOSIS — I10 Essential (primary) hypertension: Secondary | ICD-10-CM

## 2023-02-05 DIAGNOSIS — M159 Polyosteoarthritis, unspecified: Secondary | ICD-10-CM

## 2023-02-05 DIAGNOSIS — K219 Gastro-esophageal reflux disease without esophagitis: Secondary | ICD-10-CM

## 2023-02-05 MED ORDER — SPIRONOLACTONE 25 MG PO TABS: ORAL_TABLET | ORAL | 0 refills | Status: DC

## 2023-02-05 MED ORDER — OMEPRAZOLE 40 MG PO CPDR: 40.0000 mg | DELAYED_RELEASE_CAPSULE | Freq: Every day | ORAL | 0 refills | Status: DC

## 2023-02-05 MED ORDER — HYDROCODONE-ACETAMINOPHEN 5-325 MG PO TABS: 1.0000 | ORAL_TABLET | Freq: Three times a day (TID) | ORAL | 0 refills | Status: DC | PRN

## 2023-02-05 NOTE — Patient Instructions (Addendum)
[name] , Thank you for taking time to come for your Medicare Wellness Visit. I appreciate your ongoing commitment to your health goals. Please review the following plan we discussed and let me know if I can assist you in the future.   These are the goals we discussed:  Goals      Client understands the importance of follow-up with providers by attending scheduled visits        This is a list of the screening recommended for you and due dates:  Health Maintenance  Topic Date Due   Zoster (Shingles) Vaccine (1 of 2) Never done   COVID-19 Vaccine (4 - 2023-24 season) 07/26/2022   DTaP/Tdap/Td vaccine (3 - Td or Tdap) 04/10/2030   Pneumonia Vaccine  Completed   Flu Shot  Completed   DEXA scan (bone density measurement)  Completed   HPV Vaccine  Aged Out    Advanced directives: Yes  Conditions/risks identified: Yes  Next appointment: Follow up in one year for your annual wellness visit.   Preventive Care 87 Years and Older, Female Preventive care refers to lifestyle choices and visits with your health care provider that can promote health and wellness. What does preventive care include? A yearly physical exam. This is also called an annual well check. Dental exams once or twice a year. Routine eye exams. Ask your health care provider how often you should have your eyes checked. Personal lifestyle choices, including: Daily care of your teeth and gums. Regular physical activity. Eating a healthy diet. Avoiding tobacco and drug use. Limiting alcohol use. Practicing safe sex. Taking low-dose aspirin every day. Taking vitamin and mineral supplements as recommended by your health care provider. What happens during an annual well check? The services and screenings done by your health care provider during your annual well check will depend on your age, overall health, lifestyle risk factors, and family history of disease. Counseling  Your health care provider may ask you  questions about your: Alcohol use. Tobacco use. Drug use. Emotional well-being. Home and relationship well-being. Sexual activity. Eating habits. History of falls. Memory and ability to understand (cognition). Work and work Statistician. Reproductive health. Screening  You may have the following tests or measurements: Height, weight, and BMI. Blood pressure. Lipid and cholesterol levels. These may be checked every 5 years, or more frequently if you are over 88 years old. Skin check. Lung cancer screening. You may have this screening every year starting at age 17 if you have a 30-pack-year history of smoking and currently smoke or have quit within the past 15 years. Fecal occult blood test (FOBT) of the stool. You may have this test every year starting at age 59. Flexible sigmoidoscopy or colonoscopy. You may have a sigmoidoscopy every 5 years or a colonoscopy every 10 years starting at age 55. Hepatitis C blood test. Hepatitis B blood test. Sexually transmitted disease (STD) testing. Diabetes screening. This is done by checking your blood sugar (glucose) after you have not eaten for a while (fasting). You may have this done every 1-3 years. Bone density scan. This is done to screen for osteoporosis. You may have this done starting at age 53. Mammogram. This may be done every 1-2 years. Talk to your health care provider about how often you should have regular mammograms. Talk with your health care provider about your test results, treatment options, and if necessary, the need for more tests. Vaccines  Your health care provider may recommend certain vaccines, such as: Influenza  vaccine. This is recommended every year. Tetanus, diphtheria, and acellular pertussis (Tdap, Td) vaccine. You may need a Td booster every 10 years. Zoster vaccine. You may need this after age 61. Pneumococcal 13-valent conjugate (PCV13) vaccine. One dose is recommended after age 90. Pneumococcal polysaccharide  (PPSV23) vaccine. One dose is recommended after age 55. Talk to your health care provider about which screenings and vaccines you need and how often you need them. This information is not intended to replace advice given to you by your health care provider. Make sure you discuss any questions you have with your health care provider. Document Released: 12/08/2015 Document Revised: 07/31/2016 Document Reviewed: 09/12/2015 Elsevier Interactive Patient Education  2017 [place] Prevention in the Home Falls can cause injuries. They can happen to people of all ages. There are many things you can do to make your home safe and to help prevent falls. What can I do on the outside of my home? Regularly fix the edges of walkways and driveways and fix any cracks. Remove anything that might make you trip as you walk through a door, such as a raised step or threshold. Trim any bushes or trees on the path to your home. Use bright outdoor lighting. Clear any walking paths of anything that might make someone trip, such as rocks or tools. Regularly check to see if handrails are loose or broken. Make sure that both sides of any steps have handrails. Any raised decks and porches should have guardrails on the edges. Have any leaves, snow, or ice cleared regularly. Use sand or salt on walking paths during winter. Clean up any spills in your garage right away. This includes oil or grease spills. What can I do in the bathroom? Use night lights. Install grab bars by the toilet and in the tub and shower. Do not use towel bars as grab bars. Use non-skid mats or decals in the tub or shower. If you need to sit down in the shower, use a plastic, non-slip stool. Keep the floor dry. Clean up any water that spills on the floor as soon as it happens. Remove soap buildup in the tub or shower regularly. Attach bath mats securely with double-sided non-slip rug tape. Do not have throw rugs and other things on the  floor that can make you trip. What can I do in the bedroom? Use night lights. Make sure that you have a light by your bed that is easy to reach. Do not use any sheets or blankets that are too big for your bed. They should not hang down onto the floor. Have a firm chair that has side arms. You can use this for support while you get dressed. Do not have throw rugs and other things on the floor that can make you trip. What can I do in the kitchen? Clean up any spills right away. Avoid walking on wet floors. Keep items that you use a lot in easy-to-reach places. If you need to reach something above you, use a strong step stool that has a grab bar. Keep electrical cords out of the way. Do not use floor polish or wax that makes floors slippery. If you must use wax, use non-skid floor wax. Do not have throw rugs and other things on the floor that can make you trip. What can I do with my stairs? Do not leave any items on the stairs. Make sure that there are handrails on both sides of the stairs and use them. Fix  handrails that are broken or loose. Make sure that handrails are as long as the stairways. Check any carpeting to make sure that it is firmly attached to the stairs. Fix any carpet that is loose or worn. Avoid having throw rugs at the top or bottom of the stairs. If you do have throw rugs, attach them to the floor with carpet tape. Make sure that you have a light switch at the top of the stairs and the bottom of the stairs. If you do not have them, ask someone to add them for you. What else can I do to help prevent falls? Wear shoes that: Do not have high heels. Have rubber bottoms. Are comfortable and fit you well. Are closed at the toe. Do not wear sandals. If you use a stepladder: Make sure that it is fully opened. Do not climb a closed stepladder. Make sure that both sides of the stepladder are locked into place. Ask someone to hold it for you, if possible. Clearly mark and make  sure that you can see: Any grab bars or handrails. First and last steps. Where the edge of each step is. Use tools that help you move around (mobility aids) if they are needed. These include: Canes. Walkers. Scooters. Crutches. Turn on the lights when you go into a dark area. Replace any light bulbs as soon as they burn out. Set up your furniture so you have a clear path. Avoid moving your furniture around. If any of your floors are uneven, fix them. If there are any pets around you, be aware of where they are. Review your medicines with your doctor. Some medicines can make you feel dizzy. This can increase your chance of falling. Ask your doctor what other things that you can do to help prevent falls. This information is not intended to replace advice given to you by your health care provider. Make sure you discuss any questions you have with your health care provider. Document Released: 09/07/2009 Document Revised: 04/18/2016 Document Reviewed: 12/16/2014 Elsevier Interactive Patient Education  2017 [employer].

## 2023-02-05 NOTE — Telephone Encounter (Signed)
Patient requesting refills on: Hydrocodone 10 mg, Spirolactone and Omeprazole to be sent to [facility] on [address].

## 2023-02-05 NOTE — Progress Notes (Signed)
I connected with  [name] on 02/05/23 by a audio enabled telemedicine application and verified that I am speaking with the correct person using two identifiers.  Patient Location: Home  Provider Location: Home Office  I discussed the limitations of evaluation and management by telemedicine. The patient expressed understanding and agreed to proceed.  Subjective:   [name] is a 89 y.o. female who presents for Medicare Annual (Subsequent) preventive examination.  Review of Systems     Cardiac Risk Factors include: advanced age (>48mn, >>5women);dyslipidemia;hypertension;family history of premature cardiovascular disease;obesity (BMI >30kg/m2)     Objective:    Today's Vitals   02/05/23 1302  Weight: 201 lb (91.2 kg)  Height: '5\' 2"'$  (1.575 m)  PainSc: 0-No pain   Body mass index is 36.76 kg/m.     02/05/2023    1:07 PM 02/01/2022   11:08 AM 04/28/2021    1:49 PM 03/28/2021    9:25 AM 01/23/2021    3:16 PM 09/21/2020    2:33 PM 04/25/2020   10:43 AM  Advanced Directives  Does Patient Have a Medical Advance Directive? Yes Yes No Yes Yes Yes No  Type of AParamedicof AIndependenceLiving will HWest BabylonLiving will  HMount RainierLiving will HBrew[place]Living will HAtl[place]Living will   Does patient want to make changes to medical advance directive?    No - Patient declined No - Patient declined    Copy of [place] Chart? No - copy requested No - copy requested  No - copy requested No - copy requested    Would patient like information on creating a medical advance directive?   No - Patient declined    No - Patient declined    Current Medications (verified) Outpatient Encounter Medications as of 02/05/2023  Medication Sig   albuterol (PROVENTIL) (2.5 MG/3ML) 0.083% nebulizer solution Take 3 mLs (2.5 mg total) by nebulization every 6 (six) hours as needed for  wheezing or shortness of breath.   carvedilol (COREG) 12.5 MG tablet TAKE 1 TABLET(12.5 MG) BY MOUTH TWICE DAILY WITH A MEAL   Cholecalciferol 2000 units TABS Take 2 tablets (4,000 Units total) by mouth daily.   clonazePAM (KLONOPIN) 1 MG tablet TAKE 1 TABLET(1 MG) BY MOUTH TWICE DAILY AS NEEDED FOR ANXIETY   hydrALAZINE (APRESOLINE) 50 MG tablet TAKE 1 TABLET(50 MG) BY MOUTH TWICE DAILY   HYDROcodone-acetaminophen (NORCO/VICODIN) 5-325 MG tablet Take 1 tablet by mouth every 8 (eight) hours as needed for moderate pain.   omeprazole (PRILOSEC) 40 MG capsule TAKE 1 CAPSULE(40 MG) BY MOUTH DAILY   promethazine (PHENERGAN) 12.5 MG tablet TAKE 1 TABLET(12.5 MG) BY MOUTH EVERY 8 HOURS AS NEEDED FOR NAUSEA OR VOMITING   spironolactone (ALDACTONE) 25 MG tablet TAKE 1/2 TABLET(12.5 MG) BY MOUTH DAILY   torsemide (DEMADEX) 20 MG tablet TAKE 1 TABLET BY MOUTH EVERY DAY   traZODone (DESYREL) 100 MG tablet TAKE 1 TABLET(100 MG) BY MOUTH AT BEDTIME   Vibegron (GEMTESA) 75 MG TABS Take 75 mg by mouth daily.   No facility-administered encounter medications on file as of 02/05/2023.    Allergies (verified) Amlodipine, Enalapril, Losartan potassium, Metformin, and Morphine and related   History: Past Medical History:  Diagnosis Date   Abdominal pain    Anemia    NOS   Anxiety    Back pain    Bruises easily    Chronic diastolic CHF (congestive heart  failure) ([place])    a. 06/2013 EF 65-70%.   Chronic headaches    HISTORY OF    Depression    Diabetes mellitus    type II  DIET CONTROLLED   Diarrhea    Hepatitis    AGE 30S   Hyperlipidemia    Hypertension    Mild aortic stenosis    a. 06/2013 Echo: EF 65-70%, mod LVH with focal basal hypertrophy, very mild AS.   Osteoarthritis    Oxygen desaturation during sleep    USES 2 LITERS BEDTIME   VIA CPAP 06/2012 [facility]    Shortness of breath    WITH EXERTION USES 2 L O2 BEDTIME   Sleep apnea    CPAP WITH O2 2 LITERS 2013 ([facility])   Wears dentures     Wears glasses    Past Surgical History:  Procedure Laterality Date   ABDOMINAL HYSTERECTOMY     APPENDECTOMY     CHOLECYSTECTOMY     JOINT REPLACEMENT     LEFT KNEE    KNEE ARTHROPLASTY Right 03/22/2015   Procedure: COMPUTER ASSISTED TOTAL KNEE ARTHROPLASTY;  Surgeon: Marybelle Killings, MD;  Location: [place];  Service: Orthopedics;  Laterality: Right;   KNEE ARTHROSCOPY     RIGHT   LEFT AND RIGHT HEART CATHETERIZATION WITH CORONARY ANGIOGRAM N/A 10/28/2013   Procedure: LEFT AND RIGHT HEART CATHETERIZATION WITH CORONARY ANGIOGRAM;  Surgeon: Blane Ohara, MD;  Location: [facility] CATH LAB;  Service: Cardiovascular;  Laterality: N/A;   LEFT HEART CATHETERIZATION WITH CORONARY ANGIOGRAM N/A 11/14/2014   Procedure: LEFT HEART CATHETERIZATION WITH CORONARY ANGIOGRAM;  Surgeon: Josue Hector, MD;  [facility] C[facility];  Service: Cardiovascular;  Laterality: N/A;   REVISION TOTAL KNEE ARTHROPLASTY  2011   ROTATOR CUFF REPAIR  2010   SHOULDER ARTHROSCOPY  09/17/2012   Procedure: ARTHROSCOPY SHOULDER;  Surgeon: Sharmon Revere, MD;  Location: [place];  Service: Orthopedics;  Laterality: Left;   TONSILLECTOMY     TONSILLECTOMY     Family History  Problem Relation Age of Onset   Cancer Father        prostate cancer   Heart disease Sister    Heart disease Brother    Alcohol abuse Other    Hypertension Other    Kidney disease Other    Mental illness Other    Emphysema Brother    Social History   Socioeconomic History   Marital status: Widowed    Spouse name: Not on file   Number of children: 5   Years of education: Not on file   Highest education level: Not on file  Occupational History   Occupation: retired     Fish farm manager: RETIRED    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[place] Use   Smoking status: Never   Smokeless tobacco: Never  Vaping Use   Vaping Use: Never used  Substance and Sexual Activity   Alcohol use: No   Drug use: No   Sexual activity: Never  Other Topics Concern   Not on  file  Social History Narrative   Not on file   Social Determinants of Health   Financial Resource Strain: [place]  (02/05/2023)   Overall Financial Resource Strain (CARDIA)    Difficulty of Paying Living Expenses: Not hard at all  Food Insecurity: No Food Insecurity (02/05/2023)   Hunger Vital Sign    Worried About Running Out of Food in the Last Year: Never true    Ran Out  of Food in the Last Year: Never true  Transportation Needs: No Transportation Needs (02/05/2023)   PRAPARE - Hydrologist (Medical): No    Lack of Transportation (Non-Medical): No  Physical Activity: Sufficiently Active (02/05/2023)   Exercise Vital Sign    Days of Exercise per Week: 5 days    Minutes of Exercise per Session: 30 min  Stress: No Stress Concern Present (02/05/2023)   [place]    Feeling of Stress : Not at all  Social Connections: Socially Isolated (02/05/2023)   Social Connection and Isolation Panel [NHANES]    Frequency of Communication with Friends and Family: Three times a week    Frequency of Social Gatherings with Friends and Family: Three times a week    Attends Religious Services: Never    Active Member of Clubs or Organizations: No    Attends Archivist Meetings: Never    Marital Status: Widowed    Tobacco Counseling Counseling given: Not Answered   Clinical Intake:  Pre-visit preparation completed: Yes  Pain : No/denies pain Pain Score: 0-No pain     BMI - recorded: 36.76 Nutritional Status: BMI > 30  Obese Nutritional Risks: None Diabetes: No  How often do you need to have someone help you when you read instructions, pamphlets, or other written materials from your doctor or pharmacy?: 1 - Never What is the last grade level you completed in school?: HSG  Diabetic? No  Interpreter Needed?: No  Information entered by :: Lisette Abu, LPN.   Activities of Daily  Living    02/05/2023    1:16 PM  In your present state of health, do you have any difficulty performing the following activities:  Hearing? 1  Vision? 0  Difficulty concentrating or making decisions? 0  Walking or climbing stairs? 1  Dressing or bathing? 0  Doing errands, shopping? 0  Preparing Food and eating ? N  Using the Toilet? N  In the past six months, have you accidently leaked urine? Y  Do you have problems with loss of bowel control? N  Managing your Medications? N  Managing your Finances? N  Housekeeping or managing your Housekeeping? N    Patient Care Team: [name], MD as PCP - General [name], MD as PCP - Cardiology (Cardiology) [name], MD as Consulting Physician (Cardiology) Hortencia Pilar, MD as Consulting Physician (Ophthalmology)  Indicate any recent Medical Services you may have received from other than [facility] providers in the past year (date may be approximate).     Assessment:   This is a routine wellness examination for [place].  Hearing/Vision screen Hearing Screening - Comments:: Patient wears hearing aids. Vision Screening - Comments:: Wears rx glasses - up to date with routine eye exams with Monna Fam, MD and Bernarda Caffey, MD.    Dietary issues and exercise activities discussed: Current Exercise Habits: Home exercise routine, Type of exercise: walking, Time (Minutes): 30, Frequency (Times/Week): 5, Weekly Exercise (Minutes/Week): 150, Intensity: Mild, Exercise limited by: cardiac condition(s);orthopedic condition(s)   Goals Addressed             This Visit's Progress    Client understands the importance of follow-up with providers by attending scheduled visits        Depression Screen    02/05/2023    1:18 PM 09/04/2022   11:43 AM 02/01/2022   11:08 AM 02/01/2022   11:06 AM 01/16/2022  10:30 AM 05/09/2021    7:57 AM 03/28/2021    9:27 AM  PHQ 2/9 Scores  PHQ - 2 Score 2 2 0 0 0 3 0  PHQ- 9  Score '2 3    11     '$ Fall Risk    02/05/2023    1:08 PM 09/04/2022   11:43 AM 07/01/2022   10:05 AM 04/01/2022   11:30 AM 02/08/2022   11:30 AM  Fall Risk   Falls in the past year? 0 0 0 0   Comment   continues to deny falls x last 12 months; uses walker "all the time" denies new/ recent falls x 12 months; continues using walker Denies new/ recent falls since last outreach- states no falls x 12 months; ocasionally uses cane/ walker  Number falls in past yr: 0 0 0 0   Injury with Fall? 0 0 0 0   Comment   N/A- no falls reported    Risk for fall due to : No Fall Risks History of fall(s) Impaired mobility;Medication side effect Impaired mobility;Medication side effect Medication side effect;Impaired mobility;Other (Comment)  Risk for fall due to: Comment  walker   chronic pain  Follow up Falls prevention discussed Falls evaluation completed Falls prevention discussed Falls prevention discussed Falls prevention discussed    FALL RISK PREVENTION PERTAINING TO THE HOME:  Any stairs in or around the home? No  If so, are there any without handrails? No  Home free of loose throw rugs in walkways, pet beds, electrical cords, etc? Yes  Adequate lighting in your home to reduce risk of falls? Yes   ASSISTIVE DEVICES UTILIZED TO PREVENT FALLS:  Life alert? No  Use of a cane, walker or w/c? Yes  Grab bars in the bathroom? Yes  Shower chair or bench in shower? Yes  Elevated toilet seat or a handicapped toilet? Yes   TIMED UP AND GO:  Was the test performed? No . Telephonic Visit   Cognitive Function:        02/05/2023    1:12 PM 08/19/2019    3:49 PM  6CIT Screen  What Year? 0 points 0 points  What month? 0 points 0 points  What time? 0 points 0 points  Count back from 20 2 points 2 points  Months in reverse 2 points 4 points  Repeat phrase 0 points 2 points  Total Score 4 points 8 points    Immunizations Immunization History  Administered Date(s) Administered   Fluad Quad(high  Dose 65+) 08/19/2019, 09/04/2022   Influenza Split 08/20/2012   Influenza, High Dose Seasonal PF 08/02/2015, 09/10/2016, 09/17/2017, 09/15/2018   Influenza,inj,Quad PF,6+ Mos 10/28/2013, 11/28/2014   Influenza-Unspecified 08/16/2020   PFIZER Comirnaty(Gray Top)Covid-19 Tri-Sucrose Vaccine 01/02/2021   PFIZER(Purple Top)SARS-COV-2 Vaccination 12/30/2019, 01/20/2020   Pneumococcal Conjugate-13 05/25/2014   Pneumococcal Polysaccharide-23 04/16/2012, 09/15/2018   Td 04/25/2010   Tdap 04/10/2020   Zoster, Live 11/28/2014    TDAP status: Up to date  Flu Vaccine status: Up to date  Pneumococcal vaccine status: Up to date  Covid-19 vaccine status: Completed vaccines  Qualifies for Shingles Vaccine? Yes   Zostavax completed Yes   Shingrix Completed?: No.    Education has been provided regarding the importance of this vaccine. Patient has been advised to call insurance company to determine out of pocket expense if they have not yet received this vaccine. Advised may also receive vaccine at local pharmacy or Health Dept. Verbalized acceptance and understanding.  Screening Tests Health Maintenance  Topic Date Due   Zoster Vaccines- Shingrix (1 of 2) Never done   COVID-19 Vaccine (4 - 2023-24 season) 07/26/2022   DTaP/Tdap/Td (3 - Td or Tdap) 04/10/2030   Pneumonia Vaccine 6+ Years old  Completed   INFLUENZA VACCINE  Completed   DEXA SCAN  Completed   HPV VACCINES  Aged Out    Health Maintenance  Health Maintenance Due  Topic Date Due   Zoster Vaccines- Shingrix (1 of 2) Never done   COVID-19 Vaccine (4 - 2023-24 season) 07/26/2022    Colorectal cancer screening: No longer required.   Mammogram status: Completed 02/13/2022. Repeat every year  Bone Density status: No longer recommended  Lung Cancer Screening: (Low Dose CT Chest recommended if Age 61-80 years, 30 pack-year currently smoking OR have quit w/in 15years.) does not qualify.   Lung Cancer Screening Referral:  no  Additional Screening:  Hepatitis C Screening: does not qualify; Completed: no  Vision Screening: Recommended annual ophthalmology exams for early detection of glaucoma and other disorders of the eye. Is the patient up to date with their annual eye exam?  Yes  Who is the provider or what is the name of the office in which the patient attends annual eye exams? Bernarda Caffey, MD ([facility]) and Monna Fam, MD. If pt is not established with a provider, would they like to be referred to a provider to establish care? No .   Dental Screening: Recommended annual dental exams for proper oral hygiene  Community Resource Referral / Chronic Care Management: CRR required this visit?  No   CCM required this visit?  No      Plan:     I have personally reviewed and noted the following in the patient's chart:   Medical and social history Use of alcohol, tobacco or illicit drugs  Current medications and supplements including opioid prescriptions. Patient is currently taking opioid prescriptions. Information provided to patient regarding non-opioid alternatives. Patient advised to discuss non-opioid treatment plan with their provider. Functional ability and status Nutritional status Physical activity Advanced directives List of other physicians Hospitalizations, surgeries, and ER visits in previous 12 months Vitals Screenings to include cognitive, depression, and falls Referrals and appointments  In addition, I have reviewed and discussed with patient certain preventive protocols, quality metrics, and best practice recommendations. A written personalized care plan for preventive services as well as general preventive health recommendations were provided to patient.     Sheral Flow, LPN   [id number]   Nurse Notes:  Vitals provided by patient during her follow-up with cardiology on 02/04/2023.

## 2023-02-07 ENCOUNTER — Emergency Department (HOSPITAL_BASED_OUTPATIENT_CLINIC_OR_DEPARTMENT_OTHER)
Admission: EM | Admit: 2023-02-07 | Discharge: 2023-02-07 | Disposition: A | Discharge status: Home / Self Care | Payer: Medicare HMO | Attending: Emergency Medicine | Admitting: Emergency Medicine

## 2023-02-07 ENCOUNTER — Emergency Department (HOSPITAL_BASED_OUTPATIENT_CLINIC_OR_DEPARTMENT_OTHER): Payer: Medicare HMO

## 2023-02-07 ENCOUNTER — Encounter (HOSPITAL_BASED_OUTPATIENT_CLINIC_OR_DEPARTMENT_OTHER): Payer: Self-pay

## 2023-02-07 ENCOUNTER — Other Ambulatory Visit: Payer: Self-pay

## 2023-02-07 DIAGNOSIS — R0602 Shortness of breath: Secondary | ICD-10-CM

## 2023-02-07 DIAGNOSIS — Z7951 Long term (current) use of inhaled steroids: Secondary | ICD-10-CM | POA: Insufficient documentation

## 2023-02-07 DIAGNOSIS — J449 Chronic obstructive pulmonary disease, unspecified: Secondary | ICD-10-CM | POA: Insufficient documentation

## 2023-02-07 DIAGNOSIS — R7989 Other specified abnormal findings of blood chemistry: Secondary | ICD-10-CM | POA: Diagnosis not present

## 2023-02-07 DIAGNOSIS — I110 Hypertensive heart disease with heart failure: Secondary | ICD-10-CM | POA: Diagnosis not present

## 2023-02-07 DIAGNOSIS — I509 Heart failure, unspecified: Secondary | ICD-10-CM | POA: Diagnosis not present

## 2023-02-07 DIAGNOSIS — Z79899 Other long term (current) drug therapy: Secondary | ICD-10-CM | POA: Insufficient documentation

## 2023-02-07 DIAGNOSIS — Z1152 Encounter for screening for COVID-19: Secondary | ICD-10-CM | POA: Insufficient documentation

## 2023-02-07 DIAGNOSIS — J9811 Atelectasis: Secondary | ICD-10-CM | POA: Diagnosis not present

## 2023-02-07 DIAGNOSIS — J441 Chronic obstructive pulmonary disease with (acute) exacerbation: Secondary | ICD-10-CM | POA: Diagnosis not present

## 2023-02-07 LAB — BRAIN NATRIURETIC PEPTIDE: B Natriuretic Peptide: 255.7 pg/mL — ABNORMAL HIGH (ref 0.0–100.0)

## 2023-02-07 LAB — CBC
HCT: 39.2 % (ref 36.0–46.0)
Hemoglobin: 12.4 g/dL (ref 12.0–15.0)
MCH: 30.2 pg (ref 26.0–34.0)
MCHC: 31.6 g/dL (ref 30.0–36.0)
MCV: 95.4 fL (ref 80.0–100.0)
Platelets: 137 10*3/uL — ABNORMAL LOW (ref 150–400)
RBC: 4.11 MIL/uL (ref 3.87–5.11)
RDW: 12.5 % (ref 11.5–15.5)
WBC: 7 10*3/uL (ref 4.0–10.5)
nRBC: 0 % (ref 0.0–0.2)

## 2023-02-07 LAB — COMPREHENSIVE METABOLIC PANEL
ALT: 10 U/L (ref 0–44)
AST: 17 U/L (ref 15–41)
Albumin: 3.6 g/dL (ref 3.5–5.0)
Alkaline Phosphatase: 50 U/L (ref 38–126)
Anion gap: 5 (ref 5–15)
BUN: 10 mg/dL (ref 8–23)
CO2: 32 mmol/L (ref 22–32)
Calcium: 8.8 mg/dL — ABNORMAL LOW (ref 8.9–10.3)
Chloride: 100 mmol/L (ref 98–111)
Creatinine, Ser: 0.67 mg/dL (ref 0.44–1.00)
GFR, Estimated: >60 mL/min (ref >60)
Glucose, Bld: 99 mg/dL (ref 70–99)
Potassium: 4.1 mmol/L (ref 3.5–5.1)
Sodium: 137 mmol/L (ref 135–145)
Total Bilirubin: 1.1 mg/dL (ref 0.3–1.2)
Total Protein: 6.7 g/dL (ref 6.5–8.1)

## 2023-02-07 LAB — D-DIMER, QUANTITATIVE: D-Dimer, Quant: 0.33 ug/mL-FEU (ref 0.00–0.50)

## 2023-02-07 LAB — RESP PANEL BY RT-PCR (RSV, FLU A&B, COVID)  RVPGX2
Influenza A by PCR: NEGATIVE
Influenza B by PCR: NEGATIVE
Resp Syncytial Virus by PCR: NEGATIVE
SARS Coronavirus 2 by RT PCR: NEGATIVE

## 2023-02-07 LAB — TROPONIN I (HIGH SENSITIVITY)
Troponin I (High Sensitivity): 6 ng/L (ref <18)
Troponin I (High Sensitivity): 6 ng/L (ref <18)

## 2023-02-07 MED ORDER — MECLIZINE HCL 12.5 MG PO TABS: 12.5000 mg | ORAL_TABLET | Freq: Three times a day (TID) | ORAL | 0 refills | Status: DC | PRN

## 2023-02-07 NOTE — ED Provider Notes (Signed)
[facility] Provider Note   CSN: [id number] Arrival date & time: 02/07/23  1025     History {Add pertinent medical, surgical, social history, OB history to HPI:1} Chief Complaint  Patient presents with   Shortness of Breath    [name] is a 89 y.o. female.   Shortness of Breath    Patient has a history of hyperlipidemia, hypertension, osteoarthritis,Dyspnea on exertion, Mild aortic stenosis, chronic CHF.  Patient presents to the ED for evaluation of shortness of breath.  Patient states when she got up this morning she felt more short of breath.  She tried her oxygen concentrator.  Usually that helps but she did not feel any better.  Patient then tried a breathing treatment but it still did not improve.  Patient states she then started to feel somewhat dizzy and lightheaded and had some tingling in her left arm.  Patient then decided to try to eat something to see if that would help but then she started having some abdominal discomfort and nausea and she felt like her breathing got even worse.  She came to the ED for evaluation.  Patient states her symptoms have now resolved and she is feeling better on the oxygen supplementation we have here.  She is on 2 L which is what she would use at home  Home Medications Prior to Admission medications   Medication Sig Start Date End Date Taking? Authorizing Provider  albuterol (PROVENTIL) (2.5 MG/3ML) 0.083% nebulizer solution Take 3 mLs (2.5 mg total) by nebulization every 6 (six) hours as needed for wheezing or shortness of breath. 09/04/22   [name], MD  carvedilol (COREG) 12.5 MG tablet TAKE 1 TABLET(12.5 MG) BY MOUTH TWICE DAILY WITH A MEAL 03/07/22   [name], MD  Cholecalciferol 2000 units TABS Take 2 tablets (4,000 Units total) by mouth daily. 03/31/18   [name], MD  clonazePAM (KLONOPIN) 1 MG tablet TAKE 1 TABLET(1 MG) BY MOUTH TWICE DAILY AS NEEDED FOR ANXIETY  10/24/22   [name], MD  hydrALAZINE (APRESOLINE) 50 MG tablet TAKE 1 TABLET(50 MG) BY MOUTH TWICE DAILY 06/13/22   [name], MD  HYDROcodone-acetaminophen (NORCO/VICODIN) 5-325 MG tablet Take 1 tablet by mouth every 8 (eight) hours as needed for moderate pain. 02/05/23   [name], MD  omeprazole (PRILOSEC) 40 MG capsule Take 1 capsule (40 mg total) by mouth daily. 02/05/23   [name], MD  promethazine (PHENERGAN) 12.5 MG tablet TAKE 1 TABLET(12.5 MG) BY MOUTH EVERY 8 HOURS AS NEEDED FOR NAUSEA OR VOMITING 09/04/22   [name], MD  spironolactone (ALDACTONE) 25 MG tablet TAKE 1/2 TABLET(12.5 MG) BY MOUTH DAILY 02/05/23   [name], MD  torsemide (DEMADEX) 20 MG tablet TAKE 1 TABLET BY MOUTH EVERY DAY 10/26/22   [name], MD  traZODone (DESYREL) 100 MG tablet TAKE 1 TABLET(100 MG) BY MOUTH AT BEDTIME 10/22/22   [name], MD  Vibegron (GEMTESA) 75 MG TABS Take 75 mg by mouth daily.    [provider]      Allergies    Amlodipine, Enalapril, Losartan potassium, Metformin, and Morphine and related    Review of Systems   Review of Systems  Respiratory:  Positive for shortness of breath.     Physical Exam Updated Vital Signs Ht 1.575 m (5\' 2" )   Wt 90.7 kg   SpO2 98%   BMI 36.58 kg/m  Physical  Exam Vitals and nursing note reviewed.  Constitutional:      Appearance: She is well-developed. She is not ill-appearing or diaphoretic.  HENT:     Head: Normocephalic and atraumatic.     Right Ear: External ear normal.     Left Ear: External ear normal.  Eyes:     General: No scleral icterus.       Right eye: No discharge.        Left eye: No discharge.     Conjunctiva/sclera: Conjunctivae normal.  Neck:     Trachea: No tracheal deviation.  Cardiovascular:     Rate and Rhythm: Normal rate and regular rhythm.  Pulmonary:     Effort: Pulmonary effort is normal. No respiratory distress.     Breath sounds: Normal breath sounds.  No stridor. No wheezing or rales.  Abdominal:     General: Bowel sounds are normal. There is no distension.     Palpations: Abdomen is soft.     Tenderness: There is no abdominal tenderness. There is no guarding or rebound.  Musculoskeletal:        General: No tenderness or deformity.     Cervical back: Neck supple.     Right lower leg: No tenderness. No edema.     Left lower leg: No tenderness. No edema.  Skin:    General: Skin is warm and dry.     Findings: No rash.  Neurological:     General: No focal deficit present.     Mental Status: She is alert.     Cranial Nerves: No cranial nerve deficit, dysarthria or facial asymmetry.     Sensory: No sensory deficit.     Motor: No abnormal muscle tone or seizure activity.     Coordination: Coordination normal.  Psychiatric:        Mood and Affect: Mood normal.     ED Results / Procedures / Treatments   Labs (all labs ordered are listed, but only abnormal results are displayed) Labs Reviewed - No data to display  EKG EKG Interpretation  Date/Time:  Friday February 07 2023 10:40:59 EDT Ventricular Rate:  60 PR Interval:  54 QRS Duration: 79 QT Interval:  415 QTC Calculation: 415 R Axis:   60 Text Interpretation: Unknown rhythm, irregular rate Short PR interval Anteroseptal infarct, old Abnormal T, consider ischemia, diffuse leads No significant change since last tracing Confirmed by Dorie Rank [phone]) on 02/07/2023 10:52:53 AM  Radiology No results found.  Procedures Procedures  {Document cardiac monitor, telemetry assessment procedure when appropriate:1}  Medications Ordered in ED Medications - No data to display  ED Course/ Medical Decision Making/ A&P   {   Click here for ABCD2, HEART and other calculatorsREFRESH Note before signing :1}                          Medical Decision Making Amount and/or Complexity of Data Reviewed Labs: ordered. Radiology: ordered.   ***  {Document critical care time when  appropriate:1} {Document review of labs and clinical decision tools ie heart score, Chads2Vasc2 etc:1}  {Document your independent review of radiology images, and any outside records:1} {Document your discussion with family members, caretakers, and with consultants:1} {Document social determinants of health affecting pt's care:1} {Document your decision making why or why not admission, treatments were needed:1} Final Clinical Impression(s) / ED Diagnoses Final diagnoses:  None    Rx / DC Orders ED Discharge Orders     None

## 2023-02-07 NOTE — ED Notes (Signed)
Patient ambulated to the bathroom and back successfully. When asked if she felt short of breath while walking, she said "not so much".

## 2023-02-07 NOTE — ED Notes (Signed)
D/c paperwork reviewed with pt, including prescriptions and follow up care.  No questions or concerns voiced at time of d/c. Marland Kitchen Pt verbalized understanding, Wheeled by ED staff to ED exit, NAD.  Pt assisted to reconnect to personal oxygen supply prior to d/c.

## 2023-02-07 NOTE — Discharge Instructions (Signed)
You can try taking the meclizine to see if that helps with your dizziness.  Not taking it if it causes side effects or if you do not feel any improvement.  Continue your oxygen and your home medications.  Follow-up with your doctor to be rechecked

## 2023-02-07 NOTE — ED Triage Notes (Signed)
"  Had covid back in February and haven't been able to get completley over it, This morning I got short winded and my oxygen wasn't helping, also some nausea" per pt On home O2 at 2L via [place] as needed.

## 2023-02-13 NOTE — Progress Notes (Signed)
[facility] Note  02/19/2023     CHIEF COMPLAINT Patient presents for Retina Follow Up   HISTORY OF PRESENT ILLNESS: [name] is a 89 y.o. female who presents to the clinic today for:   HPI     Retina Follow Up   In right eye.  This started 6 months ago.  Duration of 6 months.  Since onset it is gradually worsening.  I, the attending physician,  performed the HPI with the patient and updated documentation appropriately.        Comments   6 month retina follow up ret hemes OD pt is reporting she is having more trouble seeing her TV and has to move her glasses down to focus she looks over them       Last edited by Bernarda Caffey, MD on 02/20/2023  1:26 PM.    Pt is delayed to follow up from 3-4 months to 6 months bc she had covid when she was supposed to come back in January  Referring physician: [name], MD [place],  [place] [id number]  HISTORICAL INFORMATION:   Selected notes from the MEDICAL RECORD NUMBER Referred by Dr. Lucianne Lei LEE:  Ocular Hx- PMH-    CURRENT MEDICATIONS: No current outpatient medications on file. (Ophthalmic Drugs)   No current facility-administered medications for this visit. (Ophthalmic Drugs)   Current Outpatient Medications (Other)  Medication Sig   albuterol (PROVENTIL) (2.5 MG/3ML) 0.083% nebulizer solution Take 3 mLs (2.5 mg total) by nebulization every 6 (six) hours as needed for wheezing or shortness of breath.   carvedilol (COREG) 12.5 MG tablet TAKE 1 TABLET(12.5 MG) BY MOUTH TWICE DAILY WITH A MEAL (Patient taking differently: Take 12.5 mg by mouth 2 (two) times daily with a meal.)   Cholecalciferol 2000 units TABS Take 2 tablets (4,000 Units total) by mouth daily.   clonazePAM (KLONOPIN) 1 MG tablet TAKE 1 TABLET(1 MG) BY MOUTH TWICE DAILY AS NEEDED FOR ANXIETY (Patient taking differently: Take 1 mg by mouth 2 (two) times daily as needed for anxiety.)   hydrALAZINE (APRESOLINE) 50  MG tablet TAKE 1 TABLET(50 MG) BY MOUTH TWICE DAILY (Patient taking differently: Take 50 mg by mouth 2 (two) times daily.)   HYDROcodone-acetaminophen (NORCO/VICODIN) 5-325 MG tablet Take 1 tablet by mouth every 8 (eight) hours as needed for moderate pain.   meclizine (ANTIVERT) 12.5 MG tablet Take 1 tablet (12.5 mg total) by mouth 3 (three) times daily as needed for dizziness.   omeprazole (PRILOSEC) 40 MG capsule Take 1 capsule (40 mg total) by mouth daily.   promethazine (PHENERGAN) 12.5 MG tablet TAKE 1 TABLET(12.5 MG) BY MOUTH EVERY 8 HOURS AS NEEDED FOR NAUSEA OR VOMITING (Patient taking differently: Take 12.5 mg by mouth every 8 (eight) hours as needed for nausea or vomiting.)   spironolactone (ALDACTONE) 25 MG tablet TAKE 1/2 TABLET(12.5 MG) BY MOUTH DAILY (Patient taking differently: Take 12.5 mg by mouth daily. TAKE 1/2 TABLET(12.5 MG) BY MOUTH DAILY)   torsemide (DEMADEX) 20 MG tablet TAKE 1 TABLET BY MOUTH EVERY DAY (Patient taking differently: Take 20 mg by mouth daily.)   traZODone (DESYREL) 100 MG tablet TAKE 1 TABLET(100 MG) BY MOUTH AT BEDTIME (Patient taking differently: Take 100 mg by mouth at bedtime.)   Vibegron (GEMTESA) 75 MG TABS Take 75 mg by mouth daily in the afternoon.   No current facility-administered medications for this visit. (Other)   REVIEW OF SYSTEMS: ROS  Positive for: Gastrointestinal, Eyes, Respiratory, Psychiatric Negative for: Constitutional, Neurological, Skin, Genitourinary, Musculoskeletal, HENT, Endocrine, Cardiovascular, Allergic/Imm, Heme/Lymph Last edited by Parthenia Ames, COT on 02/19/2023 12:55 PM.      ALLERGIES Allergies  Allergen Reactions   Amlodipine Other (See Comments)    Reaction:  Dizziness    Enalapril Cough   Losartan Potassium Other (See Comments)    Reaction:  GI pain    Metformin Diarrhea   Morphine And Related Itching and Nausea And Vomiting   PAST MEDICAL HISTORY Past Medical History:  Diagnosis Date    Abdominal pain    Anemia    NOS   Anxiety    Back pain    Bruises easily    Chronic diastolic CHF (congestive heart failure) ([place])    a. 06/2013 EF 65-70%.   Chronic headaches    HISTORY OF    Depression    Diabetes mellitus    type II  DIET CONTROLLED   Diarrhea    Hepatitis    AGE 30S   Hyperlipidemia    Hypertension    Mild aortic stenosis    a. 06/2013 Echo: EF 65-70%, mod LVH with focal basal hypertrophy, very mild AS.   Osteoarthritis    Oxygen desaturation during sleep    USES 2 LITERS BEDTIME   VIA CPAP 06/2012 [facility]    Shortness of breath    WITH EXERTION USES 2 L O2 BEDTIME   Sleep apnea    CPAP WITH O2 2 LITERS 2013 ([facility])   Wears dentures    Wears glasses    Past Surgical History:  Procedure Laterality Date   ABDOMINAL HYSTERECTOMY     APPENDECTOMY     CHOLECYSTECTOMY     JOINT REPLACEMENT     LEFT KNEE    KNEE ARTHROPLASTY Right 03/22/2015   Procedure: COMPUTER ASSISTED TOTAL KNEE ARTHROPLASTY;  Surgeon: Marybelle Killings, MD;  Location: [facility];  Service: Orthopedics;  Laterality: Right;   KNEE ARTHROSCOPY     RIGHT   LEFT AND RIGHT HEART CATHETERIZATION WITH CORONARY ANGIOGRAM N/A 10/28/2013   Procedure: LEFT AND RIGHT HEART CATHETERIZATION WITH CORONARY ANGIOGRAM;  Surgeon: Blane Ohara, MD;  Location: [facility] CATH LAB;  Service: Cardiovascular;  Laterality: N/A;   LEFT HEART CATHETERIZATION WITH CORONARY ANGIOGRAM N/A 11/14/2014   Procedure: LEFT HEART CATHETERIZATION WITH CORONARY ANGIOGRAM;  Surgeon: Josue Hector, MD;  Location: [facility] CATH LAB;  Service: Cardiovascular;  Laterality: N/A;   REVISION TOTAL KNEE ARTHROPLASTY  2011   ROTATOR CUFF REPAIR  2010   SHOULDER ARTHROSCOPY  09/17/2012   Procedure: ARTHROSCOPY SHOULDER;  Surgeon: Sharmon Revere, MD;  Location: [employer];  Service: Orthopedics;  Laterality: Left;   TONSILLECTOMY     TONSILLECTOMY     FAMILY HISTORY Family History  Problem Relation Age of Onset   Cancer Father        prostate cancer    Heart disease Sister    Heart disease Brother    Alcohol abuse Other    Hypertension Other    Kidney disease Other    Mental illness Other    Emphysema Brother    SOCIAL HISTORY Social History   Tobacco Use   Smoking status: Never   Smokeless tobacco: Never  Vaping Use   Vaping Use: Never used  Substance Use Topics   Alcohol use: No   Drug use: No       OPHTHALMIC EXAM:  Base Eye Exam  Visual Acuity (Snellen - Linear)       Right Left   Dist cc 20/25 -2 20/25 -1    Correction: Glasses         Tonometry (Tonopen, 1:01 PM)       Right Left   Pressure 14 15         Pupils       Pupils Dark Light Shape React APD   Right PERRL 2 1 Round Brisk None   Left PERRL 2 1 Round Brisk None         Visual Fields       Left Right    Full Full         Extraocular Movement       Right Left    Full, Ortho Full, Ortho         Neuro/Psych     Oriented x3: Yes   Mood/Affect: Normal         Dilation     Both eyes: 2.5% Phenylephrine @ 12:56 PM           Slit Lamp and Fundus Exam     External Exam       Right Left   External Normal Normal         Slit Lamp Exam       Right Left   Lids/Lashes Dermatochalasis - upper lid Dermatochalasis - upper lid   Conjunctiva/Sclera mild melanosis mild melanosis   Cornea trace PEE, well healed cataract wound, mild arcus Arcus, trace PEE   Anterior Chamber deep and clear deep and clear   Iris Round and dilated, No NVI Round and dilated, No NVI   Lens 3 piece PC IOL in good position 3 piece PC IOL in good position   Anterior Vitreous mild syneresis mild syneresis         Fundus Exam       Right Left   Disc mild Pallor, Sharp rim mild Pallor, Sharp rim, temporal PPA   C/D Ratio 0.2 0.3   Macula Flat, Good foveal reflex, RPE mottling, old, white subretinal heme -- resolved, persistent pigmentation ST macula Flat, Good foveal reflex, RPE mottling, No heme or edema   Vessels attenuated,  copper wiring, Tortuous, AV crossing changes, white, fibrotic RAM along ST arcades with surrounding atrophy and pigment clumping -- stable attenuated, mild tortuosity, mild copper wiring, mild AV crossing changes   Periphery Attached, focal atrophy around ST RAM, peripheral drusen, no heme Attached, mild midzonal drusen greatest temporally, No heme           Refraction     Wearing Rx       Sphere Cylinder Axis Add   Right -1.00 +1.25 005 +2.50   Left -3.00 +1.50 003 +2.50            IMAGING AND PROCEDURES  Imaging and Procedures for 02/19/2023  OCT, Retina - OU - Both Eyes       Right Eye Quality was good. Central Foveal Thickness: 260. Progression has improved. Findings include normal foveal contour, no IRF, no SRF, subretinal hyper-reflective material, inner retinal atrophy, outer retinal atrophy, vitreomacular adhesion (Focal SRHM/IRHM superior macula along ST arcades - vastly improved).   Left Eye Quality was good. Central Foveal Thickness: 243. Progression has been stable. Findings include normal foveal contour, no IRF, no SRF, vitreomacular adhesion .   Notes *Images captured and stored on drive  Diagnosis / Impression:  OD: Focal SRHM/IRHM superior macula along ST arcades --  improved OS: NFP; no IRF/SRF; +VMA  Clinical management:  See below  Abbreviations: NFP - Normal foveal profile. CME - cystoid macular edema. PED - pigment epithelial detachment. IRF - intraretinal fluid. SRF - subretinal fluid. EZ - ellipsoid zone. ERM - epiretinal membrane. ORA - outer retinal atrophy. ORT - outer retinal tubulation. SRHM - subretinal hyper-reflective material. IRHM - intraretinal hyper-reflective material            ASSESSMENT/PLAN:    ICD-10-CM   1. Retinal macroaneurysm of right eye  H35.011 OCT, Retina - OU - Both Eyes    2. Subretinal hemorrhage of right eye  H35.61 OCT, Retina - OU - Both Eyes    3. Essential hypertension  I10     4. Hypertensive  retinopathy of both eyes  H35.033 OCT, Retina - OU - Both Eyes    5. Diabetes mellitus type 2 without retinopathy ([place])  E11.9     6. Pseudophakia, both eyes  Z96.1      1,2. Old subretinal hemorrhage from retinal macroaneurysm OD -- improving  - RAM along ST arcades w/ surrounding old SRH (white), pigment clumping and atrophy -- improving  - pt reports elevated BP and hospitalization June 2022 -- SBP 190s-218  - suspect RAM w/ SRH related to severely elevated BP  - pt asymptomatic with BCVA OD 20/25 -- stable  - exam shows white SRH almost resolved -- no active red heme  - FA 6.28.23 -- no leakage to suggest active RAM  - no retinal or ophthalmic interventions indicated or recommended   - monitor  - f/u 9 months, sooner prn -- DFE/OCT  3,4. Hypertensive retinopathy OU - discussed importance of tight BP control and likely relation to #1,2 above - monitor  5. Diabetes mellitus, type 2 without retinopathy  - A1c 4.8 on 10.26.22 - The incidence, risk factors for progression, natural history and treatment options for diabetic retinopathy  were discussed with patient.   - The need for close monitoring of blood glucose, blood pressure, and serum lipids, avoiding cigarette or any type of tobacco, and the need for long term follow up was also discussed with patient. - monitor  6. Pseudophakia OU  - s/p CE/IOL OU  - IOLs in good position, doing well  - monitor  Ophthalmic Meds Ordered this visit:  No orders of the defined types were placed in this encounter.    Return in about 9 months (around 11/21/2023) for f/u SRH OD, DFE, OCT.  There are no Patient Instructions on file for this visit.   Explained the diagnoses, plan, and follow up with the patient and they expressed understanding.  Patient expressed understanding of the importance of proper follow up care.   This document serves as a record of services personally performed by Gardiner Sleeper, MD, PhD. It was created on their  behalf by Orvan Falconer, an ophthalmic technician. The creation of this record is the provider's dictation and/or activities 238during the visit.    Electronically signed by: Orvan Falconer, OA, 02/20/23  1:27 PM  This document serves as a record of services personally performed by Gardiner Sleeper, MD, PhD. It was created on their behalf by San Jetty. Owens Shark, OA an ophthalmic technician. The creation of this record is the provider's dictation and/or activities during the visit.    Electronically signed by: San Jetty. Owens Shark, [place] 03.27.2024 1:27 PM  Gardiner Sleeper, M.D., Ph.D. Diseases & Surgery of the Retina and Vitreous [facility]  I have reviewed the above documentation for accuracy and completeness, and I agree with the above. Gardiner Sleeper, M.D., Ph.D. 02/20/23 1:30 PM   Abbreviations: M myopia (nearsighted); A astigmatism; H hyperopia (farsighted); P presbyopia; Mrx spectacle prescription;  CTL contact lenses; OD right eye; OS left eye; OU both eyes  XT exotropia; ET esotropia; PEK punctate epithelial keratitis; PEE punctate epithelial erosions; DES dry eye syndrome; MGD meibomian gland dysfunction; ATs artificial tears; PFAT's preservative free artificial tears; [place] nuclear sclerotic cataract; PSC posterior subcapsular cataract; ERM epi-retinal membrane; PVD posterior vitreous detachment; RD retinal detachment; DM diabetes mellitus; DR diabetic retinopathy; NPDR non-proliferative diabetic retinopathy; PDR proliferative diabetic retinopathy; CSME clinically significant macular edema; DME diabetic macular edema; dbh dot blot hemorrhages; CWS cotton wool spot; POAG primary open angle glaucoma; C/D cup-to-disc ratio; HVF humphrey visual field; GVF goldmann visual field; OCT optical coherence tomography; IOP intraocular pressure; BRVO Branch retinal vein occlusion; CRVO central retinal vein occlusion; CRAO central retinal artery occlusion; BRAO branch retinal artery  occlusion; RT retinal tear; SB scleral buckle; PPV pars plana vitrectomy; VH Vitreous hemorrhage; PRP panretinal laser photocoagulation; IVK intravitreal kenalog; VMT vitreomacular traction; MH Macular hole;  NVD neovascularization of the disc; NVE neovascularization elsewhere; AREDS age related eye disease study; ARMD age related macular degeneration; POAG primary open angle glaucoma; EBMD epithelial/anterior basement membrane dystrophy; ACIOL anterior chamber intraocular lens; IOL intraocular lens; PCIOL posterior chamber intraocular lens; Phaco/IOL phacoemulsification with intraocular lens placement; [place] photorefractive keratectomy; LASIK laser assisted in situ keratomileusis; HTN hypertension; DM diabetes mellitus; COPD chronic obstructive pulmonary disease

## 2023-02-18 ENCOUNTER — Encounter (HOSPITAL_BASED_OUTPATIENT_CLINIC_OR_DEPARTMENT_OTHER): Payer: Self-pay | Admitting: Cardiology

## 2023-02-19 ENCOUNTER — Ambulatory Visit (INDEPENDENT_AMBULATORY_CARE_PROVIDER_SITE_OTHER): Payer: Medicare HMO | Admitting: Ophthalmology

## 2023-02-19 DIAGNOSIS — H35011 Changes in retinal vascular appearance, right eye: Secondary | ICD-10-CM | POA: Diagnosis not present

## 2023-02-19 DIAGNOSIS — I10 Essential (primary) hypertension: Secondary | ICD-10-CM | POA: Diagnosis not present

## 2023-02-19 DIAGNOSIS — E119 Type 2 diabetes mellitus without complications: Secondary | ICD-10-CM

## 2023-02-19 DIAGNOSIS — H35033 Hypertensive retinopathy, bilateral: Secondary | ICD-10-CM | POA: Diagnosis not present

## 2023-02-19 DIAGNOSIS — H3561 Retinal hemorrhage, right eye: Secondary | ICD-10-CM | POA: Diagnosis not present

## 2023-02-19 DIAGNOSIS — Z961 Presence of intraocular lens: Secondary | ICD-10-CM

## 2023-02-20 ENCOUNTER — Encounter (INDEPENDENT_AMBULATORY_CARE_PROVIDER_SITE_OTHER): Payer: Self-pay | Admitting: Ophthalmology

## 2023-03-05 DIAGNOSIS — R351 Nocturia: Secondary | ICD-10-CM | POA: Diagnosis not present

## 2023-03-05 DIAGNOSIS — R8279 Other abnormal findings on microbiological examination of urine: Secondary | ICD-10-CM | POA: Diagnosis not present

## 2023-03-05 DIAGNOSIS — N3946 Mixed incontinence: Secondary | ICD-10-CM | POA: Diagnosis not present

## 2023-03-05 DIAGNOSIS — N3000 Acute cystitis without hematuria: Secondary | ICD-10-CM | POA: Diagnosis not present

## 2023-03-25 ENCOUNTER — Encounter: Payer: Self-pay | Admitting: Internal Medicine

## 2023-03-25 ENCOUNTER — Ambulatory Visit (INDEPENDENT_AMBULATORY_CARE_PROVIDER_SITE_OTHER): Payer: Medicare HMO | Admitting: Internal Medicine

## 2023-03-25 VITALS — BP 136/76 | HR 61 | Temp 98.2°F | Resp 16 | Ht 62.0 in | Wt 195.0 lb

## 2023-03-25 DIAGNOSIS — Z515 Encounter for palliative care: Secondary | ICD-10-CM

## 2023-03-25 DIAGNOSIS — J4520 Mild intermittent asthma, uncomplicated: Secondary | ICD-10-CM

## 2023-03-25 DIAGNOSIS — M159 Polyosteoarthritis, unspecified: Secondary | ICD-10-CM

## 2023-03-25 DIAGNOSIS — I10 Essential (primary) hypertension: Secondary | ICD-10-CM | POA: Diagnosis not present

## 2023-03-25 MED ORDER — ALBUTEROL SULFATE (2.5 MG/3ML) 0.083% IN NEBU: 2.5000 mg | INHALATION_SOLUTION | Freq: Four times a day (QID) | RESPIRATORY_TRACT | 3 refills | Status: DC | PRN

## 2023-03-25 MED ORDER — SPIRONOLACTONE 25 MG PO TABS: ORAL_TABLET | ORAL | 1 refills | Status: DC

## 2023-03-25 MED ORDER — HYDROCODONE-ACETAMINOPHEN 10-325 MG PO TABS: 1.0000 | ORAL_TABLET | Freq: Three times a day (TID) | ORAL | 0 refills | Status: AC | PRN

## 2023-03-25 NOTE — Patient Instructions (Signed)

## 2023-03-25 NOTE — Progress Notes (Signed)
Subjective:  Patient ID: [name], female    DOB: August 17, 1933  Age: 89 y.o. MRN: [id number]  CC: Hypertension and Osteoarthritis   HPI [name] presents for f/up ----  She complains of worsening musculoskeletal pain and would like to increase the dose of hydrocodone.  Her review of systems is otherwise unchanged.  Outpatient Medications Prior to Visit  Medication Sig Dispense Refill   carvedilol (COREG) 12.5 MG tablet TAKE 1 TABLET(12.5 MG) BY MOUTH TWICE DAILY WITH A MEAL (Patient taking differently: Take 12.5 mg by mouth 2 (two) times daily with a meal.) 180 tablet 1   Cholecalciferol 2000 units TABS Take 2 tablets (4,000 Units total) by mouth daily. 180 tablet 1   clonazePAM (KLONOPIN) 1 MG tablet TAKE 1 TABLET(1 MG) BY MOUTH TWICE DAILY AS NEEDED FOR ANXIETY (Patient taking differently: Take 1 mg by mouth 2 (two) times daily as needed for anxiety.) 60 tablet 5   hydrALAZINE (APRESOLINE) 50 MG tablet TAKE 1 TABLET(50 MG) BY MOUTH TWICE DAILY (Patient taking differently: Take 50 mg by mouth 2 (two) times daily.) 180 tablet 1   meclizine (ANTIVERT) 12.5 MG tablet Take 1 tablet (12.5 mg total) by mouth 3 (three) times daily as needed for dizziness. 21 tablet 0   omeprazole (PRILOSEC) 40 MG capsule Take 1 capsule (40 mg total) by mouth daily. 90 capsule 0   promethazine (PHENERGAN) 12.5 MG tablet TAKE 1 TABLET(12.5 MG) BY MOUTH EVERY 8 HOURS AS NEEDED FOR NAUSEA OR VOMITING (Patient taking differently: Take 12.5 mg by mouth every 8 (eight) hours as needed for nausea or vomiting.) 20 tablet 5   torsemide (DEMADEX) 20 MG tablet TAKE 1 TABLET BY MOUTH EVERY DAY (Patient taking differently: Take 20 mg by mouth daily.) 90 tablet 1   traZODone (DESYREL) 100 MG tablet TAKE 1 TABLET(100 MG) BY MOUTH AT BEDTIME (Patient taking differently: Take 100 mg by mouth at bedtime.) 90 tablet 1   Vibegron (GEMTESA) 75 MG TABS Take 75 mg by mouth daily in the afternoon.     albuterol (PROVENTIL) (2.5  MG/3ML) 0.083% nebulizer solution Take 3 mLs (2.5 mg total) by nebulization every 6 (six) hours as needed for wheezing or shortness of breath. 225 mL 3   HYDROcodone-acetaminophen (NORCO/VICODIN) 5-325 MG tablet Take 1 tablet by mouth every 8 (eight) hours as needed for moderate pain. 90 tablet 0   spironolactone (ALDACTONE) 25 MG tablet TAKE 1/2 TABLET(12.5 MG) BY MOUTH DAILY (Patient taking differently: Take 12.5 mg by mouth daily. TAKE 1/2 TABLET(12.5 MG) BY MOUTH DAILY) 45 tablet 0   No facility-administered medications prior to visit.    ROS Review of Systems  Constitutional:  Positive for fatigue. Negative for appetite change, chills, diaphoresis, fever and unexpected weight change.  HENT:  Positive for trouble swallowing. Negative for sore throat and voice change.   Eyes: Negative.   Respiratory:  Positive for shortness of breath. Negative for cough, choking, chest tightness, wheezing and stridor.   Cardiovascular:  Negative for chest pain, palpitations and leg swelling.  Gastrointestinal:  Negative for abdominal pain, blood in stool, constipation, diarrhea, nausea and vomiting.  Endocrine: Negative.   Genitourinary: Negative.  Negative for difficulty urinating.  Musculoskeletal:  Positive for arthralgias and gait problem. Negative for joint swelling and myalgias.  Skin: Negative.   Neurological:  Negative for dizziness, weakness and light-headedness.  Hematological:  Negative for adenopathy. Does not bruise/bleed easily.  Psychiatric/Behavioral: Negative.      Objective:  BP 136/76 (BP  Location: Right Arm, Patient Position: Sitting, Cuff Size: Large)   Pulse 61   Temp 98.2 F (36.8 C) (Oral)   Resp 16   Ht 5\' 2"  (1.575 m)   Wt 195 lb (88.5 kg)   SpO2 95%   BMI 35.67 kg/m   BP Readings from Last 3 Encounters:  03/25/23 136/76  02/07/23 (!) 143/69  02/04/23 122/60    Wt Readings from Last 3 Encounters:  03/25/23 195 lb (88.5 kg)  02/07/23 200 lb (90.7 kg)  02/05/23  201 lb (91.2 kg)    Physical Exam Vitals reviewed.  Constitutional:      Appearance: She is obese. She is not ill-appearing.  HENT:     Nose: Nose normal.     Mouth/Throat:     Mouth: Mucous membranes are moist.  Eyes:     General: No scleral icterus.    Conjunctiva/sclera: Conjunctivae normal.  Cardiovascular:     Rate and Rhythm: Normal rate and regular rhythm.     Heart sounds: No murmur heard. Pulmonary:     Effort: Pulmonary effort is normal.     Breath sounds: No stridor. No wheezing, rhonchi or rales.  Abdominal:     General: Abdomen is flat.     Palpations: There is no mass.     Tenderness: There is no abdominal tenderness. There is no guarding.     Hernia: No hernia is present.  Musculoskeletal:        General: Normal range of motion.     Cervical back: Neck supple.     Right lower leg: No edema.     Left lower leg: No edema.  Lymphadenopathy:     Cervical: No cervical adenopathy.  Skin:    General: Skin is warm and dry.  Neurological:     General: No focal deficit present.     Mental Status: She is alert. Mental status is at baseline.  Psychiatric:        Mood and Affect: Mood normal.        Behavior: Behavior normal.     Lab Results  Component Value Date   WBC 7.0 02/07/2023   HGB 12.4 02/07/2023   HCT 39.2 02/07/2023   PLT 137 (L) 02/07/2023   GLUCOSE 99 02/07/2023   CHOL 194 09/23/2022   TRIG 198.0 (H) 09/23/2022   HDL 63.70 09/23/2022   LDLDIRECT 121.0 06/12/2017   LDLCALC 90 09/23/2022   ALT 10 02/07/2023   AST 17 02/07/2023   NA 137 02/07/2023   K 4.1 02/07/2023   CL 100 02/07/2023   CREATININE 0.67 02/07/2023   BUN 10 02/07/2023   CO2 32 02/07/2023   TSH 5.13 09/23/2022   INR 1.0 08/11/2020   HGBA1C 4.8 09/19/2021    DG Chest Port 1 View  Result Date: 02/07/2023 CLINICAL DATA:  SOB EXAM: PORTABLE CHEST 1 VIEW COMPARISON:  CXR 12/03/22 FINDINGS: The heart size and mediastinal contours are within normal limits. Bibasilar atelectasis.  The visualized skeletal structures are unremarkable. IMPRESSION: No active disease. Electronically Signed   By: Lorenza Cambridge M.D.   On: 02/07/2023 11:25    Assessment & Plan:   Primary osteoarthritis involving multiple joints -     HYDROcodone-Acetaminophen; Take 1 tablet by mouth every 8 (eight) hours as needed.  Dispense: 90 tablet; Refill: 0  Mild intermittent asthma, unspecified whether complicated -     Albuterol Sulfate; Take 3 mLs (2.5 mg total) by nebulization every 6 (six) hours as needed for wheezing or  shortness of breath.  Dispense: 225 mL; Refill: 3  Essential hypertension-her blood pressure is adequately well-controlled.  Recent electrolytes were normal. -     Spironolactone; TAKE 1/2 TABLET(12.5 MG) BY MOUTH DAILY  Dispense: 45 tablet; Refill: 1  Encounter for palliative care involving management of pain -     HYDROcodone-Acetaminophen; Take 1 tablet by mouth every 8 (eight) hours as needed.  Dispense: 90 tablet; Refill: 0     Follow-up: Return in about 4 months (around 07/25/2023).  Sanda Linger, MD

## 2023-04-02 DIAGNOSIS — N3946 Mixed incontinence: Secondary | ICD-10-CM | POA: Diagnosis not present

## 2023-04-02 DIAGNOSIS — R350 Frequency of micturition: Secondary | ICD-10-CM | POA: Diagnosis not present

## 2023-04-03 ENCOUNTER — Telehealth: Payer: Self-pay | Admitting: Cardiology

## 2023-04-03 DIAGNOSIS — I10 Essential (primary) hypertension: Secondary | ICD-10-CM

## 2023-04-03 MED ORDER — SPIRONOLACTONE 25 MG PO TABS: ORAL_TABLET | ORAL | 1 refills | Status: DC

## 2023-04-03 NOTE — Telephone Encounter (Signed)
Pt returned call

## 2023-04-03 NOTE — Telephone Encounter (Signed)
Left message to call back  

## 2023-04-03 NOTE — Telephone Encounter (Signed)
Pt c/o medication issue:  1. Name of Medication:   spironolactone (ALDACTONE) 25 MG tablet    2. How are you currently taking this medication (dosage and times per day)? Half a tablet twice a day  3. Are you having a reaction (difficulty breathing--STAT)? no  4. What is your medication issue? Patient states she was having a problem and was told to take half a tablet twice a day now. She says the pharmacy still has the prescription for half a tablet once a day. She says this needs to be updated and sent to the pharmacy. She says she is completely out of medication now.

## 2023-04-03 NOTE — Telephone Encounter (Signed)
Call to patient to verify how she is taking the Spironolactone.  Pt states she is taking 1/2 tab (12.5 mg) twice daily.  Reviewed medication instructions from last visit with patient. Discussed with Dr. Cristal Deer who wants patient to take Spironolactone 1/2 tab (12.5 mg) once daily.  Pt aware verbalized understanding refill sent to pharmacy. Jim Like MHA RN CCM.

## 2023-04-15 ENCOUNTER — Other Ambulatory Visit: Payer: Self-pay | Admitting: Internal Medicine

## 2023-04-15 ENCOUNTER — Telehealth (HOSPITAL_BASED_OUTPATIENT_CLINIC_OR_DEPARTMENT_OTHER): Payer: Self-pay | Admitting: Cardiology

## 2023-04-15 DIAGNOSIS — I5032 Chronic diastolic (congestive) heart failure: Secondary | ICD-10-CM

## 2023-04-15 DIAGNOSIS — G4700 Insomnia, unspecified: Secondary | ICD-10-CM

## 2023-04-15 DIAGNOSIS — I10 Essential (primary) hypertension: Secondary | ICD-10-CM

## 2023-04-15 NOTE — Telephone Encounter (Signed)
Pt c/o medication issue:  1. Name of Medication: spironolactone (ALDACTONE) 25 MG tablet   2. How are you currently taking this medication (dosage and times per day)?   3. Are you having a reaction (difficulty breathing--STAT)?   4. What is your medication issue? Patient called stating the pharmacy won't fill the prescription for her. She states because it says 1/2 a pill.  She wants a script that says for a whole pill.

## 2023-04-15 NOTE — Telephone Encounter (Signed)
Returned call to patient,   She is also taking Carvedilol 12.5mg  twice a day, asked patient if she could be mixing this up with her spironolactone. She states she is not mixing them up. She states she was originally told to split the tablet twice per day, she says she has been doing this since she started on it.   Her BP log is as follows  138/79 142/70 150/80- this was out of town and forgot her medications   She states she takes her medications about 8:30 and she checks her pressure all throughout the day.   Advised patient I would send this update to Dr. Cristal Deer to decide what the best plan of action is and we would give her a call back. She states if we call back today and she doesn't answer it is because she is at the grocery store.

## 2023-04-15 NOTE — Telephone Encounter (Signed)
Called pharmacy to see what refill is with patient's spiro. Patient's refill is too early. There is previous documentation that patient was taking 1/2 tablet twice daily.   Patient states that she was originally  told to take a whole tablet, but that didn't work for her so she thinks she was told to split it in half and take it twice daily. Explained to patient that office visit note from 3/12 and telephone encounter from 5/9 both state patient is to take 1/2 tablet daily. Made several attempts to explain this to patient and she states that she never remembers being told this. Once again explained to patient that she is to take the 1/2 tablet once per day, explained the reason she cannot refill is because prescription states 1/2 tablet once per day. Patient states she will just not take the medication anymore and that Dr. Cristal Deer needs to look way back in her notes to see that she is right and she is supposed to be taking it twice per day. Advised patient if she did not wish to take the medication that is her choice and I would route to Dr. Cristal Deer for review and they could discuss at her follow up visit.

## 2023-04-22 ENCOUNTER — Other Ambulatory Visit: Payer: Self-pay | Admitting: Internal Medicine

## 2023-04-23 ENCOUNTER — Telehealth: Payer: Self-pay | Admitting: Internal Medicine

## 2023-04-23 NOTE — Telephone Encounter (Signed)
Prescription Request  04/23/2023  LOV: 03/25/2023  What is the name of the medication or equipment? HYDROcodone-acetaminophen (NORCO) 10-325 MG tablet   Have you contacted your pharmacy to request a refill? No   Which pharmacy would you like this sent to?     [id number] - [place], [place] - [address] [place] [address] Phone: [phone] Fax: [phone]   Patient notified that their request is being sent to the clinical staff for review and that they should receive a response within 2 business days.   Please advise at Mobile [phone] (mobile)

## 2023-04-24 NOTE — Telephone Encounter (Signed)
Pt called to check on refill of her  HYDROcodone-acetaminophen (NORCO) 10-325 MG tablet

## 2023-04-25 ENCOUNTER — Other Ambulatory Visit: Payer: Self-pay | Admitting: Internal Medicine

## 2023-04-25 DIAGNOSIS — M159 Polyosteoarthritis, unspecified: Secondary | ICD-10-CM

## 2023-04-25 DIAGNOSIS — Z515 Encounter for palliative care: Secondary | ICD-10-CM

## 2023-04-25 MED ORDER — HYDROCODONE-ACETAMINOPHEN 10-325 MG PO TABS: 1.0000 | ORAL_TABLET | Freq: Three times a day (TID) | ORAL | 0 refills | Status: DC | PRN

## 2023-04-25 NOTE — Telephone Encounter (Signed)
Patient wants to know if this is going to be refilled.  She is afraid to stop taking it without tapering off of the dose.  Please call patient and advise.  Phone:  [phone]

## 2023-05-15 ENCOUNTER — Ambulatory Visit: Payer: Medicare HMO | Admitting: Family Medicine

## 2023-05-15 ENCOUNTER — Ambulatory Visit (INDEPENDENT_AMBULATORY_CARE_PROVIDER_SITE_OTHER): Payer: Medicare HMO

## 2023-05-15 ENCOUNTER — Other Ambulatory Visit: Payer: Self-pay | Admitting: Internal Medicine

## 2023-05-15 ENCOUNTER — Encounter: Payer: Self-pay | Admitting: Family Medicine

## 2023-05-15 VITALS — BP 172/98 | HR 73 | Ht 63.5 in | Wt 197.6 lb

## 2023-05-15 DIAGNOSIS — M545 Low back pain, unspecified: Secondary | ICD-10-CM

## 2023-05-15 DIAGNOSIS — K219 Gastro-esophageal reflux disease without esophagitis: Secondary | ICD-10-CM

## 2023-05-15 MED ORDER — METHYLPREDNISOLONE ACETATE 40 MG/ML IJ SUSP
40.0000 mg | Freq: Once | INTRAMUSCULAR | Status: AC
  Administered 2023-05-15: 40 mg via INTRAMUSCULAR

## 2023-05-15 MED ORDER — KETOROLAC TROMETHAMINE 30 MG/ML IJ SOLN
30.0000 mg | Freq: Once | INTRAMUSCULAR | Status: AC
  Administered 2023-05-15: 30 mg via INTRAMUSCULAR

## 2023-05-15 NOTE — Progress Notes (Signed)
   I, Stevenson Clinch, CMA acting as a scribe for Clementeen Graham, MD.  [name] is a 89 y.o. female who presents to [facility] today for back pain. Pt was last seen by Dr. Denyse Amass on 10/22/21 and was prescribed tizanidine, prednisone, recommended OTC medication, and was referred to PT, but never completed/scheduled any visits.   Today, pt reports right-sided lower back pain x 1-2 weeks. Sx started after trimming hedges. Pt locates pain to right lower back, localized. Generalized weakness in B LE, ambulating with a cane today. Has tried a muscle relaxer with no relief. Upon entering the exam room, pt cried out in pain and immediately had to lay down on the exam table with her legs rotated to the left. Pt had difficult communicating during this time.   Radiating pain: no LE numbness/tingling: yes LE weakness: yes Aggravates: sx are constant Treatments tried: muscle relaxer  Pertinent review of systems: No fevers or chills  Relevant historical information: Heart disease.  Chronic opiates   Exam:  BP (!) 172/98   Pulse 73   Ht 5' 3.5" (1.613 m)   Wt 197 lb 9.6 oz (89.6 kg)   SpO2 95%   BMI 34.45 kg/m  General: Well Developed, well nourished, in pain appearing  MSK: L-spine: Normal appearing. Nontender palpation spinal midline. Tender palpation right lumbar paraspinal musculature. Lower extremity strength is intact.    Lab and Radiology Results  X-ray images lumbar spine obtained today personally and independently interpreted Anterolisthesis at L4-5.  No acute fractures in the lumbar spine. Await formal radiology review    Assessment and Plan: 89 y.o. female with acute low back pain.  This occurs in the setting of chronic low back pain.  I believe today's pain is due to the muscle spasm and dysfunction.  I do not think she has a acute fracture in her lower portion of her lumbar spine where her pain is located.  X-ray over read is still pending.   For now we will use Toradol and Depo-Medrol injection in clinic immediately and refer to physical therapy.   PDMP not reviewed this encounter. Orders Placed This Encounter  Procedures   DG Lumbar Spine 2-3 Views    Standing Status:   Future    Number of Occurrences:   1    Standing Expiration Date:   06/14/2023    Order Specific Question:   Reason for Exam (SYMPTOM  OR DIAGNOSIS REQUIRED)    Answer:   low back pain    Order Specific Question:   Preferred imaging location?    Answer:   [facility]   Ambulatory referral to Physical Therapy    Referral Priority:   Routine    Referral Type:   Physical Medicine    Referral Reason:   Specialty Services Required    Requested Specialty:   Physical Therapy    Number of Visits Requested:   1   Meds ordered this encounter  Medications   methylPREDNISolone acetate (DEPO-MEDROL) injection 40 mg   ketorolac (TORADOL) 30 MG/ML injection 30 mg     Discussed warning signs or symptoms. Please see discharge instructions. Patient expresses understanding.   The above documentation has been reviewed and is accurate and complete Clementeen Graham, M.D.

## 2023-05-15 NOTE — Patient Instructions (Addendum)
Thank you for coming in today.   Please get an Xray today before you leave   You received an injection today. Seek immediate medical attention if the joint becomes red, extremely painful, or is oozing fluid.   I've referred you to Physical Therapy.  Let us know if you don't hear from them in one week.   Check back in 6 weeks. If not improving, let me know sooner.

## 2023-05-19 ENCOUNTER — Telehealth: Payer: Self-pay

## 2023-05-19 DIAGNOSIS — M545 Low back pain, unspecified: Secondary | ICD-10-CM

## 2023-05-19 NOTE — Telephone Encounter (Signed)
Forwarding to Dr. Corey to advise.  

## 2023-05-19 NOTE — Telephone Encounter (Signed)
I could order an MRI. THat could help clear up the cause of pain.

## 2023-05-19 NOTE — Telephone Encounter (Signed)
Called pt and advised of recommendations per Dr. Denyse Amass. Pt would like to proceed with MRI of the lower back and she feels that sx are now worse than when she was here for her visit.   She will let us know of any changes in her sx.

## 2023-05-19 NOTE — Addendum Note (Signed)
Addended by: Dierdre Searles on: 05/19/2023 12:32 PM   Modules accepted: Orders

## 2023-05-19 NOTE — Telephone Encounter (Signed)
Patient called back to follow up. Physical therapy did reach out but she is not scheduled to see them until July and doesn't feel like that will really help her pain. She is already taking Hydrocodone 10-325 every 8 hours and this does not seem to be helping.  Please advise.

## 2023-05-19 NOTE — Telephone Encounter (Signed)
Pt called to advise that she continued to have debilitating low back pain. The IM injections that she received in office on 6/20 did not even touch the pain. She is having a hard time moving around at all. Pt denied signs/symptoms of UTI. Pt notes receiving results from XR done in office last week from Dr. Denyse Amass but unsure what the results said. Pt looking for advise/recommendations as to what can be done ASAP for debilitating back pain.   Forwarding to Dr. Denyse Amass to advise.

## 2023-05-19 NOTE — Telephone Encounter (Signed)
Order for MRI has been placed. 

## 2023-05-19 NOTE — Telephone Encounter (Signed)
Radiology has not read the x-ray yet but I do not see any broken bones.  Plan for physical therapy.  I placed that referral last week.  I could prescribe some stronger pain medicines like opiates that may be an option.  Would you like me to do so?

## 2023-05-21 NOTE — Telephone Encounter (Signed)
Patient's daughter called. She said that the patient is still having a lot of pain. The MRI has been approved and they will contact [facility] to schedule as soon as possible. She asked if there was something she could do to help the pain while waiting for the MRI and results?  Please advise.

## 2023-05-21 NOTE — Progress Notes (Signed)
Lumbar spine x-ray shows arthritis and a chronic compression fracture. The MRI that we have already ordered should be helpful at determining if the compression fracture is new.  It looks old on x-ray but we will  find out soon.

## 2023-05-22 ENCOUNTER — Telehealth: Payer: Self-pay | Admitting: Internal Medicine

## 2023-05-22 NOTE — Telephone Encounter (Signed)
Unfortunately she is already on a very high dose of opiates which we cannot increase.  There is not really a great pain medicine option until we know what is wrong.

## 2023-05-22 NOTE — Telephone Encounter (Signed)
Prescription Request  05/22/2023  LOV: 03/25/2023  What is the name of the medication or equipment? HYDROcodone-acetaminophen (NORCO) 10-325 MG tablet   Have you contacted your pharmacy to request a refill? No   Which pharmacy would you like this sent to?  [id number] - [place], [place] - [address] [place] [address] Phone: [phone] Fax: [phone]    Patient notified that their request is being sent to the clinical staff for review and that they should receive a response within 2 business days.   Please advise at [facility] [phone]

## 2023-05-22 NOTE — Telephone Encounter (Signed)
Called and spoke with patient. Pt reports that sx remain unchanged. Pt understands that Dr. Denyse Amass cannot send in a stronger medication. She is scheduled to have MRI on 05/24/23.

## 2023-05-23 ENCOUNTER — Other Ambulatory Visit: Payer: Self-pay | Admitting: Internal Medicine

## 2023-05-23 DIAGNOSIS — Z515 Encounter for palliative care: Secondary | ICD-10-CM

## 2023-05-23 DIAGNOSIS — M159 Polyosteoarthritis, unspecified: Secondary | ICD-10-CM

## 2023-05-23 MED ORDER — HYDROCODONE-ACETAMINOPHEN 10-325 MG PO TABS: 1.0000 | ORAL_TABLET | Freq: Three times a day (TID) | ORAL | 0 refills | Status: DC | PRN

## 2023-05-24 ENCOUNTER — Ambulatory Visit (INDEPENDENT_AMBULATORY_CARE_PROVIDER_SITE_OTHER): Payer: Medicare HMO

## 2023-05-24 DIAGNOSIS — M545 Low back pain, unspecified: Secondary | ICD-10-CM | POA: Diagnosis not present

## 2023-05-24 DIAGNOSIS — M48061 Spinal stenosis, lumbar region without neurogenic claudication: Secondary | ICD-10-CM | POA: Diagnosis not present

## 2023-05-24 DIAGNOSIS — M5126 Other intervertebral disc displacement, lumbar region: Secondary | ICD-10-CM | POA: Diagnosis not present

## 2023-05-24 DIAGNOSIS — M47817 Spondylosis without myelopathy or radiculopathy, lumbosacral region: Secondary | ICD-10-CM | POA: Diagnosis not present

## 2023-05-24 DIAGNOSIS — M4316 Spondylolisthesis, lumbar region: Secondary | ICD-10-CM | POA: Diagnosis not present

## 2023-05-26 ENCOUNTER — Ambulatory Visit: Payer: Medicare HMO | Admitting: Physical Therapy

## 2023-06-02 ENCOUNTER — Telehealth: Payer: Self-pay | Admitting: Family Medicine

## 2023-06-02 ENCOUNTER — Telehealth: Payer: Self-pay

## 2023-06-02 DIAGNOSIS — S22080A Wedge compression fracture of T11-T12 vertebra, initial encounter for closed fracture: Secondary | ICD-10-CM

## 2023-06-02 DIAGNOSIS — M545 Low back pain, unspecified: Secondary | ICD-10-CM

## 2023-06-02 NOTE — Telephone Encounter (Signed)
Pt called back this morning and spoke w/ AT.

## 2023-06-02 NOTE — Telephone Encounter (Signed)
Patient left a message while we were closed over the weekend asking if someone could call her in regards to her MRI results. Please advise.

## 2023-06-02 NOTE — Telephone Encounter (Signed)
Pt's daughter called back and we discussed pt's MRI results. She verbalized understanding.

## 2023-06-02 NOTE — Telephone Encounter (Signed)
Refer to interventional radiology for kyphoplasty or vertebroplasty eval

## 2023-06-02 NOTE — Progress Notes (Signed)
MRI lumbar spine does show a acute compression fracture that is worsened since x-ray from June 20.  You do have arthritis in your back that could produce pain as well.  I have referred to radiology to consider kyphoplasty or vertebroplasty to help stabilize the compression fracture.

## 2023-06-04 ENCOUNTER — Telehealth: Payer: Self-pay

## 2023-06-04 NOTE — Telephone Encounter (Signed)
Pt called this morning inquiring about the status of her referral to radiology for consult about a kyphoplasty. She was provided the # for [facility] to schedule an appointment. She verbalized understanding.

## 2023-06-05 NOTE — Telephone Encounter (Signed)
Patient's daughter said that she is having a hard time getting in touch with them to schedule. She said that they have played phone tag multiple and has not been able to speak to someone directly. She asked if there was somewhere else that they could have this procedure?  Please advise.

## 2023-06-06 ENCOUNTER — Telehealth: Payer: Self-pay | Admitting: Interventional Radiology

## 2023-06-06 NOTE — Telephone Encounter (Signed)
Try to be patient with [facility] imaging.  I know it does not seem like it but they are by far the fast this is most reliable people in the region to get this procedure done.  We could switch to a different healthcare system but is going to take probably a month to get you an appointment and get it authorized and get the procedure scheduled. We will call [facility] to try to help them communicate with you.

## 2023-06-06 NOTE — Telephone Encounter (Signed)
Pt is already scheduled to see radiology on the 17th.

## 2023-06-06 NOTE — Telephone Encounter (Signed)
Finally was able to reach pt to get scheduled. Had been trying to reach pt daughter to schedule pt per daughter request. However, daughters voicemail is full. She stated she does not check her voicemails on her mobile phone where she said to call. I was unable to leave my direct number for her to call because of this.

## 2023-06-11 ENCOUNTER — Encounter: Payer: Self-pay | Admitting: Interventional Radiology

## 2023-06-11 ENCOUNTER — Other Ambulatory Visit: Payer: Self-pay

## 2023-06-11 ENCOUNTER — Ambulatory Visit: Payer: Medicare HMO | Admitting: Rehabilitative and Restorative Service Providers"

## 2023-06-11 ENCOUNTER — Ambulatory Visit
Admission: RE | Admit: 2023-06-11 | Discharge: 2023-06-11 | Disposition: A | Discharge status: Home / Self Care | Payer: Medicare HMO | Source: Ambulatory Visit | Attending: Family Medicine | Admitting: Family Medicine

## 2023-06-11 ENCOUNTER — Other Ambulatory Visit: Payer: Self-pay | Admitting: Family Medicine

## 2023-06-11 DIAGNOSIS — S22000G Wedge compression fracture of unspecified thoracic vertebra, subsequent encounter for fracture with delayed healing: Secondary | ICD-10-CM

## 2023-06-11 DIAGNOSIS — S22080A Wedge compression fracture of T11-T12 vertebra, initial encounter for closed fracture: Secondary | ICD-10-CM | POA: Diagnosis not present

## 2023-06-11 DIAGNOSIS — M545 Low back pain, unspecified: Secondary | ICD-10-CM | POA: Diagnosis not present

## 2023-06-11 NOTE — Consult Note (Signed)
Chief Complaint: Patient was seen in consultation today for Painful subacute T12 compression fracture at the request of Corey,Evan S  Referring Physician(s): Corey,Evan S  History of Present Illness: [name] is a 89 y.o. female  who developed thoracolumbar pain after some aggressive yard work about 2 months ago.  She rates the  10 out of 10 on the visual analog pain scale at its worst, improved to 8 out of 10 with her Norco pain medications.  She is taking the Norco every 8 hours as prescribed.  She scores 24 out of 24 on the   [employer] disability questionnaire.  She has some right lower quadrant pain but no lower extremity radiculopathy.  Past Medical History:  Diagnosis Date   Abdominal pain    Anemia    NOS   Anxiety    Back pain    Bruises easily    Chronic diastolic CHF (congestive heart failure) (HCC)    a. 06/2013 EF 65-70%.   Chronic headaches    HISTORY OF    Depression    Diabetes mellitus    type II  DIET CONTROLLED   Diarrhea    Hepatitis    AGE 30S   Hyperlipidemia    Hypertension    Mild aortic stenosis    a. 06/2013 Echo: EF 65-70%, mod LVH with focal basal hypertrophy, very mild AS.   Osteoarthritis    Oxygen desaturation during sleep    USES 2 LITERS BEDTIME   VIA CPAP 06/2012 [facility]    Shortness of breath    WITH EXERTION USES 2 L O2 BEDTIME   Sleep apnea    CPAP WITH O2 2 LITERS 2013 ([facility])   Wears dentures    Wears glasses     Past Surgical History:  Procedure Laterality Date   ABDOMINAL HYSTERECTOMY     APPENDECTOMY     CHOLECYSTECTOMY     JOINT REPLACEMENT     LEFT KNEE    KNEE ARTHROPLASTY Right 03/22/2015   Procedure: COMPUTER ASSISTED TOTAL KNEE ARTHROPLASTY;  Surgeon: Eldred Manges, MD;  Location: [facility] OR;  Service: Orthopedics;  Laterality: Right;   KNEE ARTHROSCOPY     RIGHT   LEFT AND RIGHT HEART CATHETERIZATION WITH CORONARY ANGIOGRAM N/A 10/28/2013   Procedure: LEFT AND RIGHT HEART CATHETERIZATION WITH CORONARY  ANGIOGRAM;  Surgeon: Micheline Chapman, MD;  Location: [facility] CATH LAB;  Service: Cardiovascular;  Laterality: N/A;   LEFT HEART CATHETERIZATION WITH CORONARY ANGIOGRAM N/A 11/14/2014   Procedure: LEFT HEART CATHETERIZATION WITH CORONARY ANGIOGRAM;  Surgeon: Wendall Stade, MD;  Location: [facility] CATH LAB;  Service: Cardiovascular;  Laterality: N/A;   REVISION TOTAL KNEE ARTHROPLASTY  2011   ROTATOR CUFF REPAIR  2010   SHOULDER ARTHROSCOPY  09/17/2012   Procedure: ARTHROSCOPY SHOULDER;  Surgeon: Kennieth Rad, MD;  Location: [facility] OR;  Service: Orthopedics;  Laterality: Left;   TONSILLECTOMY     TONSILLECTOMY      Allergies: Amlodipine, Enalapril, Losartan potassium, Metformin, and Morphine and codeine  Medications: Prior to Admission medications   Medication Sig Start Date End Date Taking? Authorizing Provider  albuterol (PROVENTIL) (2.5 MG/3ML) 0.083% nebulizer solution Take 3 mLs (2.5 mg total) by nebulization every 6 (six) hours as needed for wheezing or shortness of breath. 03/25/23  Yes Etta Grandchild, MD  carvedilol (COREG) 12.5 MG tablet TAKE 1 TABLET(12.5 MG) BY MOUTH TWICE DAILY WITH A MEAL Patient taking differently: Take 12.5 mg by mouth 2 (  two) times daily with a meal. 03/07/22  Yes Jodelle Red, MD  Cholecalciferol 2000 units TABS Take 2 tablets (4,000 Units total) by mouth daily. 03/31/18  Yes Etta Grandchild, MD  clonazePAM (KLONOPIN) 1 MG tablet TAKE 1 TABLET(1 MG) BY MOUTH TWICE DAILY AS NEEDED FOR ANXIETY 04/22/23  Yes Etta Grandchild, MD  hydrALAZINE (APRESOLINE) 50 MG tablet TAKE 1 TABLET(50 MG) BY MOUTH TWICE DAILY Patient taking differently: Take 50 mg by mouth 2 (two) times daily. 06/13/22  Yes Jodelle Red, MD  HYDROcodone-acetaminophen (NORCO) 10-325 MG tablet Take 1 tablet by mouth every 8 (eight) hours as needed. 05/23/23 06/22/23 Yes Etta Grandchild, MD  omeprazole (PRILOSEC) 40 MG capsule TAKE 1 CAPSULE(40 MG) BY MOUTH DAILY 05/15/23  Yes Etta Grandchild, MD   spironolactone (ALDACTONE) 25 MG tablet TAKE 1/2 TABLET(12.5 MG) BY MOUTH DAILY 04/03/23  Yes Jodelle Red, MD  torsemide (DEMADEX) 20 MG tablet TAKE 1 TABLET BY MOUTH EVERY DAY 04/15/23  Yes Etta Grandchild, MD  traZODone (DESYREL) 100 MG tablet TAKE 1 TABLET(100 MG) BY MOUTH AT BEDTIME 04/15/23  Yes Etta Grandchild, MD  Vibegron (GEMTESA) 75 MG TABS Take 75 mg by mouth daily in the afternoon.   Yes [provider]  meclizine (ANTIVERT) 12.5 MG tablet Take 1 tablet (12.5 mg total) by mouth 3 (three) times daily as needed for dizziness. Patient not taking: Reported on 06/11/2023 02/07/23   Linwood Dibbles, MD  promethazine (PHENERGAN) 12.5 MG tablet TAKE 1 TABLET(12.5 MG) BY MOUTH EVERY 8 HOURS AS NEEDED FOR NAUSEA OR VOMITING Patient not taking: Reported on 06/11/2023 09/04/22   Etta Grandchild, MD     Family History  Problem Relation Age of Onset   Cancer Father        prostate cancer   Heart disease Sister    Heart disease Brother    Alcohol abuse Other    Hypertension Other    Kidney disease Other    Mental illness Other    Emphysema Brother     Social History   Socioeconomic History   Marital status: Widowed    Spouse name: Not on file   Number of children: 5   Years of education: Not on file   Highest education level: Not on file  Occupational History   Occupation: retired     Associate Professor: RETIRED    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[employer]  Tobacco Use   Smoking status: Never   Smokeless tobacco: Never  Vaping Use   Vaping status: Never Used  Substance and Sexual Activity   Alcohol use: No   Drug use: No   Sexual activity: Never  Other Topics Concern   Not on file  Social History Narrative   Not on file   Social Determinants of Health   Financial Resource Strain: Low Risk  (02/05/2023)   Overall Financial Resource Strain (CARDIA)    Difficulty of Paying Living Expenses: Not hard at all  Food Insecurity: No Food Insecurity (02/05/2023)   Hunger Vital Sign     Worried About Running Out of Food in the Last Year: Never true    Ran Out of Food in the Last Year: Never true  Transportation Needs: No Transportation Needs (02/05/2023)   PRAPARE - Administrator, Civil Service (Medical): No    Lack of Transportation (Non-Medical): No  Physical Activity: Sufficiently Active (02/05/2023)   Exercise Vital Sign    Days of Exercise per Week: 5 days  Minutes of Exercise per Session: 30 min  Stress: No Stress Concern Present (02/05/2023)   [employer] of Occupational Health - Occupational Stress Questionnaire    Feeling of Stress : Not at all  Social Connections: Socially Isolated (02/05/2023)   Social Connection and Isolation Panel [NHANES]    Frequency of Communication with Friends and Family: Three times a week    Frequency of Social Gatherings with Friends and Family: Three times a week    Attends Religious Services: Never    Active Member of Clubs or Organizations: No    Attends Banker Meetings: Never    Marital Status: Widowed    ECOG Status: 2 - Symptomatic, <50% confined to bed  Review of Systems: A 12 point ROS discussed and pertinent positives are indicated in the HPI above.  All other systems are negative.  Review of Systems  Vital Signs: BP 133/61 (BP Location: Right Arm, Patient Position: Sitting)   Pulse 74   Temp 98.7 F (37.1 C)   Resp 16   SpO2 95%     Physical Exam  Mallampati Score:     Imaging: MR LUMBAR SPINE WO CONTRAST  Result Date: 05/30/2023 CLINICAL DATA:  Low back pain after doing yard work 3.5 weeks ago. Pain radiates to bilateral hips and legs. EXAM: MRI LUMBAR SPINE WITHOUT CONTRAST TECHNIQUE: Multiplanar, multisequence MR imaging of the lumbar spine was performed. No intravenous contrast was administered. COMPARISON:  Lumbar spine MRI 09/13/2014, radiographs 05/15/2023, CT abdomen/pelvis 04/28/2021 FINDINGS: Segmentation: There is transitional anatomy. The lowest disc space which  is somewhat rudimentary is designated S1-S2 which is in keeping with the prior lumbar spine MRI report from 2015. Alignment: 4 mm anterolisthesis of L4 on L5 and 5 mm anterolisthesis of L5 on S1 are unchanged since 2014. Vertebrae: There is compression deformity of the T12 vertebral body affecting the inferior endplate with associated T1 hypointensity and STIR hyperintensity consistent with an acute to subacute compression fracture. The compression deformity appears progressed since the radiographs from 05/15/2023. There is mild loss of vertebral body height and no bony retropulsion. Mild indentation of the inferior L2 endplate is unchanged since 2015. The other vertebral body heights are preserved. There is no suspicious marrow signal abnormality or other marrow edema. Conus medullaris and cauda equina: Conus extends to the L1-L2 level. Conus and cauda equina appear normal. Paraspinal and other soft tissues: A right adrenal nodule is unchanged since 1,015, likely a benign adenoma requiring no specific imaging follow-up. Small renal cysts are noted requiring no specific imaging follow-up. The paraspinal soft tissues are unremarkable. Disc levels: T11-T12: Small disc protrusion and mild facet arthropathy without significant spinal canal or neural foraminal stenosis T12-L1: Mild facet arthropathy without significant spinal canal or neural foraminal stenosis. L1-L2: There is a small disc protrusion and mild facet arthropathy without significant spinal canal or neural foraminal stenosis L2-L3: There is a minimal disc bulge and mild facet arthropathy without significant spinal canal or neural foraminal stenosis L3-L4: There is mild-to-moderate facet arthropathy with ligamentum flavum thickening but no significant spinal canal or neural foraminal stenosis. L4-L5: There is grade 1 anterolisthesis with slight uncovering of the disc posteriorly, and advanced facet arthropathy with ligamentum flavum thickening resulting in  moderate to severe spinal canal stenosis with left worse than right subarticular zone narrowing and possible impingement of the traversing left L5 nerve root, and mild left worse than right neural foraminal stenosis. The stenoses are not significantly changed since 2015. L5-S1: There is grade 1  anterolisthesis with uncovering of the disc posteriorly and advanced bilateral facet arthropathy resulting in mild bilateral subarticular zone narrowing without evidence of frank nerve root impingement, and no significant neural foraminal stenosis. IMPRESSION: 1. Acute to subacute compression fracture of the T12 vertebral body with mild loss of vertebral body height and no bony retropulsion. The compression deformity appears progressed since the radiographs from 05/15/2023. 2. Grade 1 anterolisthesis and advanced facet arthropathy at L4-L5 resulting in moderate to severe spinal canal stenosis with left worse than right subarticular zone narrowing and possible impingement of the traversing left L5 nerve root, and mild left worse than right neural foraminal stenosis. The stenoses are not significantly changed since 2015. 3. Grade 1 anterolisthesis and advanced facet arthropathy at L5-S1 with mild bilateral subarticular zone narrowing without evidence of frank nerve root impingement. Electronically Signed   By: Lesia Hausen M.D.   On: 05/30/2023 09:47   DG Lumbar Spine 2-3 Views  Result Date: 05/20/2023 CLINICAL DATA:  Pain. EXAM: LUMBAR SPINE - 2-3 VIEW COMPARISON:  07/02/2018 FINDINGS: Grade 1 L5 retrolisthesis. Mild compression deformity L2 is unchanged. Facet joint degenerative changes identified from L3 through S1. No osteolytic or osteoblastic changes. Note is made of dense atheromatous calcification of the aorta and iliacs. IMPRESSION: Degenerative changes. Grade 1 L5 retrolisthesis. Chronic L2 mild compression deformity. No acute findings. Electronically Signed   By: Layla Maw M.D.   On: 05/20/2023 18:37     Labs:  CBC: Recent Labs    09/23/22 1350 02/07/23 1147  WBC 7.8 7.0  HGB 12.4 12.4  HCT 38.5 39.2  PLT 139.0* 137*    COAGS: No results for input(s): "INR", "APTT" in the last 8760 hours.  BMP: Recent Labs    09/23/22 1350 02/07/23 1147  NA 141 137  K 4.1 4.1  CL 103 100  CO2 33* 32  GLUCOSE 85 99  BUN 20 10  CALCIUM 9.6 8.8*  CREATININE 0.70 0.67  GFRNONAA  --  >60    LIVER FUNCTION TESTS: Recent Labs    09/23/22 1350 02/07/23 1147  BILITOT 0.7 1.1  AST 14 17  ALT 9 10  ALKPHOS 53 50  PROT 6.4 6.7  ALBUMIN 3.9 3.6    TUMOR MARKERS: No results for input(s): "AFPTM", "CEA", "CA199", "CHROMGRNA" in the last 8760 hours.  Assessment and Plan:  My impression is that this patient has subacute unhealed T12 vertebral body compression fracture deformity which likely contributes or accounts for most of the low back pain.  Based on cross-sectional imaging, this would be anatomically approachable for percutaneous intervention.  No associated spinal stenosis or other contraindications.  No suggestion of metastatic disease or other pathologic findings to indicate a need for concomitant core biopsy. Given the  lack of adequate symptom relief with time and a fairly aggressive pain medication regimen, and   limitations of activities of daily living, the patient  is clinically an appropriate candidate for consideration of vertebral augmentation. I discussed with the patient and granddaughter the pathophysiology of vertebral compression fracture deformities; the stable nature of these which does not require emergent treatment; natural history which includes healing over some unpredictable number of months.  We discussed treatment options including watchful waiting, surgical fixation, and percutaneous kyphoplasty/vertebroplasty.  We discussed in detail the percutaneous kyphoplasty technique, anticipated benefits, time course to symptom resolution, possible risks and side effects.   We discussed his elevated risk of additional level fractures with or without vertebral augmentation.  We discussed the long-term need  for continued bone building therapy managed by the patient's PCP.   They seemed to understand, and did ask appropriate questions. The patient is motivated to proceed with treatment ASAP.  Accordingly, we schedule percutaneous Thoracic T12  kyphoplasty under moderate sedation  l as an outpatient at the patient's convenience, pending carrier approval if needed.   Thank you for this interesting consult.  I greatly enjoyed meeting [name] and look forward to participating in their care.  A copy of this report was sent to the requesting provider on this date.  Electronically Signed: Durwin Glaze 06/11/2023, 1:29 PM   I spent a total of  30 Minutes   in face to face in clinical consultation, greater than 50% of which was counseling/coordinating care for Painful subacute T12 vertebral compression fracture.

## 2023-06-12 ENCOUNTER — Ambulatory Visit
Admission: RE | Admit: 2023-06-12 | Discharge: 2023-06-12 | Disposition: A | Discharge status: Home / Self Care | Payer: Medicare HMO | Source: Ambulatory Visit | Attending: Family Medicine | Admitting: Family Medicine

## 2023-06-12 ENCOUNTER — Other Ambulatory Visit: Payer: Self-pay | Admitting: Family Medicine

## 2023-06-12 DIAGNOSIS — M8588 Other specified disorders of bone density and structure, other site: Secondary | ICD-10-CM | POA: Diagnosis not present

## 2023-06-12 DIAGNOSIS — N958 Other specified menopausal and perimenopausal disorders: Secondary | ICD-10-CM | POA: Diagnosis not present

## 2023-06-12 DIAGNOSIS — M8008XA Age-related osteoporosis with current pathological fracture, vertebra(e), initial encounter for fracture: Secondary | ICD-10-CM

## 2023-06-12 DIAGNOSIS — E349 Endocrine disorder, unspecified: Secondary | ICD-10-CM | POA: Diagnosis not present

## 2023-06-12 DIAGNOSIS — S22000G Wedge compression fracture of unspecified thoracic vertebra, subsequent encounter for fracture with delayed healing: Secondary | ICD-10-CM

## 2023-06-12 DIAGNOSIS — S22080G Wedge compression fracture of T11-T12 vertebra, subsequent encounter for fracture with delayed healing: Secondary | ICD-10-CM

## 2023-06-12 DIAGNOSIS — Z90722 Acquired absence of ovaries, bilateral: Secondary | ICD-10-CM | POA: Diagnosis not present

## 2023-06-16 ENCOUNTER — Ambulatory Visit
Admission: RE | Admit: 2023-06-16 | Discharge: 2023-06-16 | Disposition: A | Discharge status: Home / Self Care | Payer: Medicare HMO | Source: Ambulatory Visit | Attending: Family Medicine | Admitting: Family Medicine

## 2023-06-16 DIAGNOSIS — M8008XA Age-related osteoporosis with current pathological fracture, vertebra(e), initial encounter for fracture: Secondary | ICD-10-CM

## 2023-06-16 DIAGNOSIS — S22080G Wedge compression fracture of T11-T12 vertebra, subsequent encounter for fracture with delayed healing: Secondary | ICD-10-CM

## 2023-06-16 DIAGNOSIS — M546 Pain in thoracic spine: Secondary | ICD-10-CM | POA: Diagnosis not present

## 2023-06-16 DIAGNOSIS — S22088A Other fracture of T11-T12 vertebra, initial encounter for closed fracture: Secondary | ICD-10-CM | POA: Diagnosis not present

## 2023-06-16 HISTORY — PX: IR KYPHO THORACIC WITH BONE BIOPSY: IMG5518

## 2023-06-16 MED ORDER — KETOROLAC TROMETHAMINE 30 MG/ML IJ SOLN
30.0000 mg | Freq: Once | INTRAMUSCULAR | Status: AC
  Administered 2023-06-16: 30 mg via INTRAVENOUS

## 2023-06-16 MED ORDER — SODIUM CHLORIDE 0.9 % IV SOLN: INTRAVENOUS | Status: DC

## 2023-06-16 MED ORDER — MIDAZOLAM HCL 2 MG/2ML IJ SOLN
1.0000 mg | INTRAMUSCULAR | Status: DC | PRN
  Administered 2023-06-16 (×4): 1 mg via INTRAVENOUS

## 2023-06-16 MED ORDER — FENTANYL CITRATE PF 50 MCG/ML IJ SOSY
25.0000 ug | PREFILLED_SYRINGE | INTRAMUSCULAR | Status: DC | PRN
  Administered 2023-06-16 (×5): 25 ug via INTRAVENOUS

## 2023-06-16 MED ORDER — CEFAZOLIN SODIUM-DEXTROSE 2-4 GM/100ML-% IV SOLN
2.0000 g | INTRAVENOUS | Status: AC
  Administered 2023-06-16: 2 g via INTRAVENOUS

## 2023-06-16 NOTE — Discharge Instructions (Signed)
Kyphoplasty Post Procedure Discharge Instructions ? ?May resume a regular diet and any medications that you routinely take (including pain medications). However, if you are taking Aspirin or an anticoagulant/blood thinner you will be told when you can resume taking these by the healthcare provider. ?No driving day of procedure. ?The day of your procedure take it easy. You may use an ice pack as needed to injection sites on back.  Ice to back 30 minutes on and 30 minutes off, as needed. ?May remove bandaids tomorrow after taking a shower. Replace daily with a clean bandaid until healed.  ?Do not lift anything heavier than a milk jug for 1-2 weeks or determined by your physician.  ?Follow up with your physician in 2 weeks. ? ? ? ?Please contact our office at [phone] for the following symptoms or if you have any questions: ? ?Fever greater than 100 degrees ?Increased swelling, pain, or redness at injection site. ?Increased back and/or leg pain ?New numbness or change in symptoms from before the procedure.  ? ? ?Thank you for visiting [facility].  ?

## 2023-06-16 NOTE — Progress Notes (Signed)
Pt back in nursing recovery area. Pt still drowsy from procedure but will wake up when spoken to. Pt follows commands, talks in complete sentences and has no complaints at this time. Pt will remain in nurses station until discharged by Radiologist.   

## 2023-06-16 NOTE — Progress Notes (Signed)
T-score is low in the osteoporosis range.  Will talk about this in full detail during your visit with me on August 1. Will also talk about your upcoming procedure and how that went.

## 2023-06-19 ENCOUNTER — Telehealth: Payer: Self-pay | Admitting: Internal Medicine

## 2023-06-19 NOTE — Telephone Encounter (Signed)
Prescription Request  06/19/2023  LOV: 03/25/2023  What is the name of the medication or equipment? hydrocodone  Have you contacted your pharmacy to request a refill? Yes   Which pharmacy would you like this sent to?  [id number] - [place], [place] - [address] AT [address] [place] [address] Phone: [phone] Fax: [phone]  Patient notified that their request is being sent to the clinical staff for review and that they should receive a response within 2 business days.   Please advise at Mobile [phone] (mobile)

## 2023-06-20 ENCOUNTER — Other Ambulatory Visit: Payer: Self-pay | Admitting: Internal Medicine

## 2023-06-20 DIAGNOSIS — Z515 Encounter for palliative care: Secondary | ICD-10-CM

## 2023-06-20 DIAGNOSIS — M159 Polyosteoarthritis, unspecified: Secondary | ICD-10-CM

## 2023-06-20 MED ORDER — HYDROCODONE-ACETAMINOPHEN 10-325 MG PO TABS
1.0000 | ORAL_TABLET | Freq: Three times a day (TID) | ORAL | 0 refills | Status: DC | PRN
Start: 2023-06-20 — End: 2023-07-17

## 2023-06-23 ENCOUNTER — Other Ambulatory Visit: Payer: Self-pay | Admitting: Interventional Radiology

## 2023-06-23 ENCOUNTER — Telehealth: Payer: Self-pay

## 2023-06-23 DIAGNOSIS — S22000G Wedge compression fracture of unspecified thoracic vertebra, subsequent encounter for fracture with delayed healing: Secondary | ICD-10-CM

## 2023-06-23 NOTE — Telephone Encounter (Signed)
Phone call to pt to follow up from her kyphoplasty on 06/16/23. Pt reports her pain is almost completely gone post procedure but is still having "a little soreness", rating pain 1/10. Getting around better and feels great.    Pt denies any signs of infection, no redness at the site, no draining or fever. Pt has no complaints at this time and will be scheduled for a telephone follow up with Dr. Deanne Coffer next week.   Pt advised to call back if anything were to change or any concerns arise and we will arrange an in person appointment. Pt verbalized understanding.

## 2023-06-25 ENCOUNTER — Encounter (INDEPENDENT_AMBULATORY_CARE_PROVIDER_SITE_OTHER): Payer: Self-pay

## 2023-06-25 ENCOUNTER — Other Ambulatory Visit: Payer: Medicare HMO

## 2023-06-26 ENCOUNTER — Telehealth: Payer: Self-pay

## 2023-06-26 ENCOUNTER — Ambulatory Visit: Payer: Medicare HMO | Admitting: Family Medicine

## 2023-06-26 VITALS — BP 164/88 | HR 63 | Ht 63.5 in | Wt 191.0 lb

## 2023-06-26 DIAGNOSIS — M810 Age-related osteoporosis without current pathological fracture: Secondary | ICD-10-CM | POA: Insufficient documentation

## 2023-06-26 DIAGNOSIS — M8080XS Other osteoporosis with current pathological fracture, unspecified site, sequela: Secondary | ICD-10-CM

## 2023-06-26 LAB — COMPREHENSIVE METABOLIC PANEL
ALT: 10 U/L (ref 0–35)
AST: 17 U/L (ref 0–37)
Albumin: 4.3 g/dL (ref 3.5–5.2)
Alkaline Phosphatase: 66 U/L (ref 39–117)
BUN: 23 mg/dL (ref 6–23)
CO2: 34 mEq/L — ABNORMAL HIGH (ref 19–32)
Calcium: 9.8 mg/dL (ref 8.4–10.5)
Chloride: 101 mEq/L (ref 96–112)
Creatinine, Ser: 0.85 mg/dL (ref 0.40–1.20)
GFR: 61.2 mL/min (ref >60.00)
Glucose, Bld: 97 mg/dL (ref 70–99)
Potassium: 4.4 mEq/L (ref 3.5–5.1)
Sodium: 141 mEq/L (ref 135–145)
Total Bilirubin: 0.9 mg/dL (ref 0.2–1.2)
Total Protein: 7.2 g/dL (ref 6.0–8.3)

## 2023-06-26 LAB — MAGNESIUM: Magnesium: 1.9 mg/dL (ref 1.5–2.5)

## 2023-06-26 NOTE — Patient Instructions (Addendum)
Thank you for coming in today.   Please get labs today before you leave   We will check on the special medicines for osteoporosis.   You should hear from our office soon.

## 2023-06-26 NOTE — Progress Notes (Signed)
Rubin Payor, PhD, LAT, ATC acting as a scribe for Lisa Graham, MD.  [name] is a 89 y.o. female who presents to [facility] today for f/u LBP. Pt was last seen by Dr. Denyse Amass on 05/15/23 and was given a Toradol and Depo-Medrol IM injection and was referred to PT. Pain cont'd and a MRI was ordered revealing an acute compression fx and referral was placed to interventional radiology. Pt underwent a kyphoplasty on 06/16/23.  Today, pt reports her back pain is almost resolved and she is feeling pretty good. She cont to use caution ambulating.   Dx testing: 06/12/23 DEXA scan  05/24/23 L-spine MRI  05/15/23 L-spine XR  Pertinent review of systems: No fevers or chills  Relevant historical information: Osteoporosis   Exam:  BP (!) 164/88   Pulse 63   Ht 5' 3.5" (1.613 m)   Wt 191 lb (86.6 kg)   SpO2 93%   BMI 33.30 kg/m  General: Well Developed, well nourished, and in no acute distress.   MSK: L-spine nontender    Lab and Radiology Results  EXAM: DUAL X-RAY ABSORPTIOMETRY (DXA) FOR BONE MINERAL DENSITY   IMPRESSION: Referring Physician:  Rodolph Bong Your patient completed a bone mineral density test using GE Lunar iDXA system (analysis version: 16). Technologist:     lmn PATIENT: Name: Lisa, [name] Patient ID:  [id number]   Birth Date: Feb 25, 1933 Height:     62.5 in. Sex:         Female   Measured:   06/12/2023 Weight:     192.6 lbs. Indications: Advanced Age, Bilateral Oophorectomy (65.51), Desyrel, Estrogen Deficient, Hysterectomy, Klonopin, Omeprazole, Postmenopausal, Vitamin D Deficient Fractures: NONE Treatments: Vitamin D (E933.5)   ASSESSMENT: The BMD measured at Forearm Radius 33% is 0.624 g/cm2 with a T-score of -2.9. This patient is considered osteoporotic according to World Health Organization [facility]) criteria.   The quality of the exam is limited by patient condition. The lumbar spine was excluded due to  degenerative changes. Site Region Measured Date Measured Age YA BMD Significant CHANGE T-score Left Forearm Radius 33% 06/12/2023 88.3 -2.9 0.624 g/cm2   DualFemur Neck Left 06/12/2023 88.3 -0.1 1.027 g/cm2   DualFemur Total Mean 06/12/2023 88.3 -0.3 0.971 g/cm2   World Health Organization [facility]) criteria for post-menopausal, Caucasian Women: Normal       T-score at or above -1 SD Osteopenia   T-score between -1 and -2.5 SD Osteoporosis T-score at or below -2.5 SD   RECOMMENDATION: 1. All patients should optimize calcium and vitamin D intake. 2. Consider FDA-approved medical therapies in postmenopausal women and men aged 42 years and older, based on the following: a. A hip or vertebral (clinical or morphometric) fracture. b. T-score = -2.5 at the femoral neck or spine after appropriate evaluation to exclude secondary causes. c. Low bone mass (T-score between -1.0 and -2.5 at the femoral neck or spine) and a 10-year probability of a hip fracture = 3% or a 10-year probability of a major osteoporosis-related fracture = 20% based on the [place]-adapted WHO algorithm. d. Clinician judgment and/or patient preferences may indicate treatment for people with 10-year fracture probabilities above or below these levels.   FOLLOW-UP: Patients with diagnosis of osteoporosis or at high risk for fracture should have regular bone mineral density tests. Patients eligible for Medicare are allowed routine testing every 2 years. The testing frequency can be increased to one year for patients who have rapidly progressing disease,  are receiving or discontinuing medical therapy to restore bone mass, or have additional risk factors.   I have reviewed this study and agree with the findings. [facility]     Electronically Signed   By: Frederico Hamman M.D.   On: 06/12/2023 15:25      Assessment and Plan: 89 y.o. female with vertebral compression fracture significantly improved  following kyphoplasty.  She is effectively pain-free today.  Watchful waiting regarding the actual compression fracture.  The larger underlying issue is her osteoporosis.  T-score was -2.9 recently.  This occurs in the setting of a vertebral compression fracture.  She is not on any particular medication or treatment for osteoporosis management.  We discussed options and will attempt to authorize Prolia.  This is likely to be her better option.  I am worried about her ability to tolerate alendronate given her history of GERD.  I would like to avoid Evenity given her heart failure and mild dementia. Labs in preparation for osteoporosis management today.  PDMP not reviewed this encounter. Orders Placed This Encounter  Procedures   Comprehensive metabolic panel    Standing Status:   Future    Standing Expiration Date:   06/25/2024   Magnesium    Standing Status:   Future    Standing Expiration Date:   06/25/2024   VITAMIN D 25 Hydroxy (Vit-D Deficiency, Fractures)    Standing Status:   Future    Standing Expiration Date:   06/25/2024   No orders of the defined types were placed in this encounter.    Discussed warning signs or symptoms. Please see discharge instructions. Patient expresses understanding.   The above documentation has been reviewed and is accurate and complete [name], M.D.

## 2023-06-26 NOTE — Telephone Encounter (Signed)
Lisa Bong, MD  Dierdre Searles, CMA Please [name] for osteoperosis

## 2023-06-27 NOTE — Progress Notes (Signed)
Labs including vitamin D look okay.  Continue vitamin D supplementation.  Okay to proceed to osteoporosis medicine in the future.

## 2023-06-30 NOTE — Telephone Encounter (Signed)
Prolia VOB initiated via [email or web address] 

## 2023-07-01 NOTE — Telephone Encounter (Signed)
Prior Auth required for [employer]  PA PROCESS DETAILS: PA is required. PA can be initiated by calling [phone] or online at [email or web address]

## 2023-07-02 ENCOUNTER — Ambulatory Visit
Admission: RE | Admit: 2023-07-02 | Discharge: 2023-07-02 | Disposition: A | Discharge status: Home / Self Care | Payer: Medicare HMO | Source: Ambulatory Visit | Attending: Interventional Radiology | Admitting: Interventional Radiology

## 2023-07-02 DIAGNOSIS — S22000G Wedge compression fracture of unspecified thoracic vertebra, subsequent encounter for fracture with delayed healing: Secondary | ICD-10-CM

## 2023-07-02 NOTE — Progress Notes (Addendum)
Patient ID: [name], female   DOB: 31-Oct-1933, 89 y.o.   MRN: [id number]       Chief Complaint: Patient was consulted remotely today (TeleHealth) for Thoracic T12 compression fracture post kyphoplasty at the request of ,.    Referring Physician(s): Corey,Evan S   History of Present Illness: [name] is a 89 y.o. female  who had  developed thoracolumbar pain after some aggressive yard work about 3 months ago.  10 out of 10 on the visual analog pain scale at its worst, improved to 8 out of 10 with her Norco pain medications every 8 hours as prescribed.   24 out of 24 on the   [employer] disability questionnaire.     She underwent technically successful thoracic T12 kyphoplasty on 06/11/2023.   She had partial pain relief immediately postprocedure and is a continuing improvement in her pain since that time.  She  is able to get about better. No new or worsening symptoms postprocedure. Some movements still remind her that she has a fracture.  She was formally diagnosed with osteoporosis and is in discussion with her treatment team regarding treatment options, which she is eager to pursue to decrease her risk of additional fracture at a different level.  Past Medical History:  Diagnosis Date   Abdominal pain    Anemia    NOS   Anxiety    Back pain    Bruises easily    Chronic diastolic CHF (congestive heart failure) (HCC)    a. 06/2013 EF 65-70%.   Chronic headaches    HISTORY OF    Depression    Diabetes mellitus    type II  DIET CONTROLLED   Diarrhea    Hepatitis    AGE 30S   Hyperlipidemia    Hypertension    Mild aortic stenosis    a. 06/2013 Echo: EF 65-70%, mod LVH with focal basal hypertrophy, very mild AS.   Osteoarthritis    Oxygen desaturation during sleep    USES 2 LITERS BEDTIME   VIA CPAP 06/2012 [facility]    Shortness of breath    WITH EXERTION USES 2 L O2 BEDTIME   Sleep apnea    CPAP WITH O2 2 LITERS 2013 ([facility])   Wears dentures    Wears  glasses     Past Surgical History:  Procedure Laterality Date   ABDOMINAL HYSTERECTOMY     APPENDECTOMY     CHOLECYSTECTOMY     IR KYPHO THORACIC WITH BONE BIOPSY  06/16/2023   JOINT REPLACEMENT     LEFT KNEE    KNEE ARTHROPLASTY Right 03/22/2015   Procedure: COMPUTER ASSISTED TOTAL KNEE ARTHROPLASTY;  Surgeon: Eldred Manges, MD;  Location: [facility] OR;  Service: Orthopedics;  Laterality: Right;   KNEE ARTHROSCOPY     RIGHT   LEFT AND RIGHT HEART CATHETERIZATION WITH CORONARY ANGIOGRAM N/A 10/28/2013   Procedure: LEFT AND RIGHT HEART CATHETERIZATION WITH CORONARY ANGIOGRAM;  Surgeon: Micheline Chapman, MD;  Location: [facility] CATH LAB;  Service: Cardiovascular;  Laterality: N/A;   LEFT HEART CATHETERIZATION WITH CORONARY ANGIOGRAM N/A 11/14/2014   Procedure: LEFT HEART CATHETERIZATION WITH CORONARY ANGIOGRAM;  Surgeon: Wendall Stade, MD;  [facility] C[facility];  Service: Cardiovascular;  Laterality: N/A;   REVISION TOTAL KNEE ARTHROPLASTY  2011   ROTATOR CUFF REPAIR  2010   SHOULDER ARTHROSCOPY  09/17/2012   Procedure: ARTHROSCOPY SHOULDER;  Surgeon: Kennieth Rad, MD;  Location[facility];  Service: Orthopedics;  Laterality: Left;   TONSILLECTOMY     TONSILLECTOMY      Allergies: Amlodipine, Enalapril, Losartan potassium, Metformin, and Morphine and codeine  Medications: Prior to Admission medications   Medication Sig Start Date End Date Taking? Authorizing Provider  albuterol (PROVENTIL) (2.5 MG/3ML) 0.083% nebulizer solution Take 3 mLs (2.5 mg total) by nebulization every 6 (six) hours as needed for wheezing or shortness of breath. 03/25/23   Etta Grandchild, MD  carvedilol (COREG) 12.5 MG tablet TAKE 1 TABLET(12.5 MG) BY MOUTH TWICE DAILY WITH A MEAL Patient taking differently: Take 12.5 mg by mouth 2 (two) times daily with a meal. 03/07/22   Jodelle Red, MD  Cholecalciferol 2000 units TABS Take 2 tablets (4,000 Units total) by mouth daily. 03/31/18   Etta Grandchild, MD  clonazePAM  (KLONOPIN) 1 MG tablet TAKE 1 TABLET(1 MG) BY MOUTH TWICE DAILY AS NEEDED FOR ANXIETY 04/22/23   Etta Grandchild, MD  hydrALAZINE (APRESOLINE) 50 MG tablet TAKE 1 TABLET(50 MG) BY MOUTH TWICE DAILY Patient taking differently: Take 50 mg by mouth 2 (two) times daily. 06/13/22   Jodelle Red, MD  HYDROcodone-acetaminophen (NORCO) 10-325 MG tablet Take 1 tablet by mouth every 8 (eight) hours as needed. 06/20/23 07/20/23  Etta Grandchild, MD  omeprazole (PRILOSEC) 40 MG capsule TAKE 1 CAPSULE(40 MG) BY MOUTH DAILY 05/15/23   Etta Grandchild, MD  spironolactone (ALDACTONE) 25 MG tablet TAKE 1/2 TABLET(12.5 MG) BY MOUTH DAILY 04/03/23   Jodelle Red, MD  torsemide (DEMADEX) 20 MG tablet TAKE 1 TABLET BY MOUTH EVERY DAY 04/15/23   Etta Grandchild, MD  traZODone (DESYREL) 100 MG tablet TAKE 1 TABLET(100 MG) BY MOUTH AT BEDTIME 04/15/23   Etta Grandchild, MD  Vibegron (GEMTESA) 75 MG TABS Take 75 mg by mouth daily in the afternoon.    [provider]     Family History  Problem Relation Age of Onset   Cancer Father        prostate cancer   Heart disease Sister    Heart disease Brother    Alcohol abuse Other    Hypertension Other    Kidney disease Other    Mental illness Other    Emphysema Brother     Social History   Socioeconomic History   Marital status: Widowed    Spouse name: Not on file   Number of children: 5   Years of education: Not on file   Highest education level: Not on file  Occupational History   Occupation: retired     Associate Professor: RETIRED    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[employer]  Tobacco Use   Smoking status: Never   Smokeless tobacco: Never  Vaping Use   Vaping status: Never Used  Substance and Sexual Activity   Alcohol use: No   Drug use: No   Sexual activity: Never  Other Topics Concern   Not on file  Social History Narrative   Not on file   Social Determinants of Health   Financial Resource Strain: Low Risk  (02/05/2023)   Overall Financial  Resource Strain (CARDIA)    Difficulty of Paying Living Expenses: Not hard at all  Food Insecurity: No Food Insecurity (02/05/2023)   Hunger Vital Sign    Worried About Running Out of Food in the Last Year: Never true    Ran Out of Food in the Last Year: Never true  Transportation Needs: No Transportation Needs (02/05/2023)   PRAPARE - Transportation    Lack  of Transportation (Medical): No    Lack of Transportation (Non-Medical): No  Physical Activity: Sufficiently Active (02/05/2023)   Exercise Vital Sign    Days of Exercise per Week: 5 days    Minutes of Exercise per Session: 30 min  Stress: No Stress Concern Present (02/05/2023)   [employer] of Occupational Health - Occupational Stress Questionnaire    Feeling of Stress : Not at all  Social Connections: Socially Isolated (02/05/2023)   Social Connection and Isolation Panel [NHANES]    Frequency of Communication with Friends and Family: Three times a week    Frequency of Social Gatherings with Friends and Family: Three times a week    Attends Religious Services: Never    Active Member of Clubs or Organizations: No    Attends Banker Meetings: Never    Marital Status: Widowed    ECOG Status: 1 - Symptomatic but completely ambulatory  Review of Systems  Review of Systems: A 12 point ROS discussed and pertinent positives are indicated in the HPI above.  All other systems are negative.   Physical Exam No direct physical exam was performed (except for noted visual exam findings with Video Visits).     Vital Signs: There were no vitals taken for this visit.  Imaging: IR KYPHO THORACIC WITH BONE BIOPSY  Result Date: 06/16/2023 CLINICAL DATA:  Painful subacute T12 compression fracture. See previous consultation. DEXA demonstrated osteoporosis. EXAM: KYPHOPLASTY AT THORACIC T12 TECHNIQUE: The procedure, risks (including but not limited to bleeding, infection, organ damage), benefits, and alternatives were explained  to the patient. Questions regarding the procedure were encouraged and answered. The patient understands and consents to the procedure. The patient was placed prone on the fluoroscopic table. The skin overlying the lower thoracic region was then prepped and draped in the usual sterile fashion. Maximal barrier sterile technique was utilized including caps, mask, sterile gowns, sterile gloves, sterile drape, hand hygiene and skin antiseptic. Intravenous Fentanyl and Versed 125mg  were administered as conscious sedation during continuous monitoring of the patient's level of consciousness and physiological / cardiorespiratory status by the radiology RN, with a total moderate sedation time of 35 minutes. As antibiotic prophylaxis, open cefazolin 2 g was ordered pre-procedure and administered intravenously within !one hour! of incision. The right pedicle at T12 was then infiltrated with 1% lidocaine followed by the advancement of a Kyphon trocar needle through the right pedicle into the posterior one-third. The trocar was removed and the osteo drill was advanced to the anterior third of the vertebral body. The osteo drill was retracted. Through the working cannula, a Kyphon inflatable bone tamp was advanced and positioned with the distal marker 5 mm from the anterior aspect of the cortex. Crossing of the midline was not seen on the AP projection. At this time, the balloon was expanded using contrast via a Kyphon inflation syringe device via micro tubing. In similar fashion, the left pedicle was infiltrated with 1% lidocaine followed by the advancement of a second Kyphon trocar needle through the left pedicle into the posterior third of the vertebral body. The osteo drill was coaxially advanced to the anterior right third. The osteo drill was exchanged for the Kyphon inflatable bone tamp , advanced to within 5 mm of the anterior aspect of the cortex. The balloon was then expanded using contrast as above. Inflations were  continued until there was apposition with the superior endplate. At this time, methylmethacrylate mixture was reconstituted in the Kyphon bone mixing device system. This was  then loaded into the delivery mechanism, attached to Kyphon bone fillers. The balloon was deflated and removed followed by the instillation of methylmethacrylate mixture with excellent filling in the AP and lateral projections, via both needles. No extravasation was noted in the disk spaces or posteriorly into the spinal canal. No epidural venous contamination was seen. The patient tolerated the procedure well. There were no acute complications. The working cannulae and the bone filler were then retrieved and removed. COMPLICATIONS: COMPLICATIONS None immediate. IMPRESSION: 1. Status post vertebral body augmentation using balloon kyphoplasty at thoracic T12 as described without event. Electronically Signed   By: Corlis Leak M.D.   On: 06/16/2023 13:11   DG BONE DENSITY (DXA)  Result Date: 06/12/2023 EXAM: DUAL X-RAY ABSORPTIOMETRY (DXA) FOR BONE MINERAL DENSITY IMPRESSION: Referring Physician:  Rodolph Bong Your patient completed a bone mineral density test using GE Lunar iDXA system (analysis version: 16). Technologist:     lmn PATIENT: Name: [name], [name] Patient ID:  [id number] Birth Date: [birth year] Height:     62.5 in. Sex:         Female Measured:   06/12/2023 Weight:     192.6 lbs. Indications: Advanced Age, Bilateral Oophorectomy (65.51), Desyrel, Estrogen Deficient, Hysterectomy, Klonopin, Omeprazole, Postmenopausal, Vitamin D Deficient Fractures: NONE Treatments: Vitamin D (E933.5) ASSESSMENT: The BMD measured at Forearm Radius 33% is 0.624 g/cm2 with a T-score of -2.9. This patient is considered osteoporotic according to World Health Organization [facility]) criteria. The quality of the exam is limited by patient condition. The lumbar spine was excluded due to degenerative changes. Site Region Measured Date Measured Age YA BMD Significant  CHANGE T-score Left Forearm Radius 33% 06/12/2023 88.3 -2.9 0.624 g/cm2 DualFemur Neck Left 06/12/2023 88.3 -0.1 1.027 g/cm2 DualFemur Total Mean 06/12/2023 88.3 -0.3 0.971 g/cm2 World Health Organization [facility]) criteria for post-menopausal, Caucasian Women: Normal       T-score at or above -1 SD Osteopenia   T-score between -1 and -2.5 SD Osteoporosis T-score at or below -2.5 SD RECOMMENDATION: 1. All patients should optimize calcium and vitamin D intake. 2. Consider FDA-approved medical therapies in postmenopausal women and men aged 4 years and older, based on the following: a. A hip or vertebral (clinical or morphometric) fracture. b. T-score = -2.5 at the femoral neck or spine after appropriate evaluation to exclude secondary causes. c. Low bone mass (T-score between -1.0 and -2.5 at the femoral neck or spine) and a 10-year probability of a hip fracture = 3% or a 10-year probability of a major osteoporosis-related fracture = 20% based on the [place]-adapted WHO algorithm. d. Clinician judgment and/or patient preferences may indicate treatment for people with 10-year fracture probabilities above or below these levels. FOLLOW-UP: Patients with diagnosis of osteoporosis or at high risk for fracture should have regular bone mineral density tests. Patients eligible for Medicare are allowed routine testing every 2 years. The testing frequency can be increased to one year for patients who have rapidly progressing disease, are receiving or discontinuing medical therapy to restore bone mass, or have additional risk factors. I have reviewed this study and agree with the findings. [facility] Electronically Signed   By: Frederico Hamman M.D.   On: 06/12/2023 15:25   DG Radiologist Eval And Mgmt  Result Date: 06/11/2023 EXAM: See complete note in [facility]. CHIEF COMPLAINT: See complete note in [facility]. HISTORY OF PRESENT ILLNESS: See complete note in [facility]. REVIEW OF SYSTEMS: See complete note in [facility]. PHYSICAL EXAMINATION:  See complete note in [facility]. ASSESSMENT AND PLAN: See complete note in [facility]. Electronically Signed   By: Corlis Leak M.D.   On: 06/11/2023 17:10    Labs:  CBC: Recent Labs    09/23/22 1350 02/07/23 1147  WBC 7.8 7.0  HGB 12.4 12.4  HCT 38.5 39.2  PLT 139.0* 137*    COAGS: No results for input(s): "INR", "APTT" in the last 8760 hours.  BMP: Recent Labs    09/23/22 1350 02/07/23 1147 06/26/23 1348  NA 141 137 141  K 4.1 4.1 4.4  CL 103 100 101  CO2 33* 32 34*  GLUCOSE 85 99 97  BUN 20 10 23   CALCIUM 9.6 8.8* 9.8  CREATININE 0.70 0.67 0.85  GFRNONAA  --  >60  --     LIVER FUNCTION TESTS: Recent Labs    09/23/22 1350 02/07/23 1147 06/26/23 1348  BILITOT 0.7 1.1 0.9  AST 14 17 17   ALT 9 10 10   ALKPHOS 53 50 66  PROT 6.4 6.7 7.2  ALBUMIN 3.9 3.6 4.3    TUMOR MARKERS: No results for input(s): "AFPTM", "CEA", "CA199", "CHROMGRNA" in the last 8760 hours.  Assessment and Plan:   My impression is that this patient has done well post thoracic T12 kyphoplasty, with expected pain relief postprocedure, no evident complication.  We discussed the importance of continued bone building therapy to minimize the risk of additional fractures, as she is at increased risk of adjacent level fractures.  She is actively in conversation with her treatment team regarding best choices for osteoporosis treatments. I do not require additional scheduled appointments.  Always to happy to see her back if she has any questions or new symptoms.   Thank you for this interesting consult.  I greatly enjoyed meeting [name] and look forward to participating in their care.  A copy of this report was sent to the requesting provider on this date.  Electronically Signed: Durwin Glaze 07/02/2023, 3:51 PM   I spent a total of    15 Minutes in remote  clinical consultation, greater than 50% of which was counseling/coordinating care for Thoracic T12  compression fracture, post kyphoplasty.    Visit type: Audio only (telephone). Audio (no video) only due to patient's lack of internet/smartphone capability. Alternative for in-person consultation at [facility], [address] [place], [place], [place].  This format is felt to be most appropriate for this patient at this time.  All issues noted in this document were discussed and addressed.

## 2023-07-03 ENCOUNTER — Telehealth: Payer: Self-pay

## 2023-07-03 NOTE — Telephone Encounter (Signed)
Evon Slack, CMA2 hours ago (12:52 PM)    Patient called stating she was supposed to be ran for an osteoporosis injection and wanted to know if insurance has approved it yet. She stated that dr. Denyse Amass said if it doesn't get approved he was going to prescribe a medication

## 2023-07-03 NOTE — Telephone Encounter (Signed)
Patient called stating she was supposed to be ran for an osteoporosis injection and wanted to know if insurance has approved it yet. She stated that dr. Denyse Amass said if it doesn't get approved he was going to prescribe a medication

## 2023-07-03 NOTE — Telephone Encounter (Signed)
See VOB Prolia telephone encounter.

## 2023-07-04 NOTE — Telephone Encounter (Signed)
Prior Auth initiated via [email or web address] KEY: [id number]

## 2023-07-07 NOTE — Telephone Encounter (Signed)
Prior Auth APPROVED PA# [id number] Valid: 07/04/23-11/25/23

## 2023-07-07 NOTE — Telephone Encounter (Signed)
Pt ready for scheduling on or after 07/07/23  Out-of-pocket cost due at time of visit: $345 (per injection)  Primary: [employer] Medicare Gold Plus  Prolia co-insurance: 20% (approximately $320) Admin fee co-insurance: 20% (approximately $25)  Deductible: does not apply  Prior Auth APPROVED PA# [id number] Valid: 07/04/23-11/25/23  Secondary: n/a Prolia co-insurance:  Admin fee co-insurance:  Deductible:  Prior Auth:  PA# Valid:   ** This summary of benefits is an estimation of the patient's out-of-pocket cost. Exact cost may vary based on individual plan coverage.

## 2023-07-08 NOTE — Telephone Encounter (Signed)
Left message for patient to call back to schedule. This will need to be ordered.

## 2023-07-08 NOTE — Telephone Encounter (Signed)
Spoke with pt. Cannot afford ANY out of pocket expenses. Pt asking if there is another med he could prescribe or if she needs to make an appt.

## 2023-07-09 NOTE — Telephone Encounter (Signed)
Dr. Denyse Amass, which alternative therapy would you like to try?   Per visit note 06/26/23:  Assessment and Plan: 89 y.o. female with vertebral compression fracture significantly improved following kyphoplasty.  She is effectively pain-free today.  Watchful waiting regarding the actual compression fracture.   The larger underlying issue is her osteoporosis.  T-score was -2.9 recently.  This occurs in the setting of a vertebral compression fracture.  She is not on any particular medication or treatment for osteoporosis management.  We discussed options and will attempt to authorize Prolia.  This is likely to be her better option.  I am worried about her ability to tolerate alendronate given her history of GERD.  I would like to avoid Evenity given her heart failure and mild dementia. Labs in preparation for osteoporosis management today.

## 2023-07-14 NOTE — Telephone Encounter (Signed)
Lets get her on Prolia if we can

## 2023-07-14 NOTE — Telephone Encounter (Signed)
Pt states that she cannot afford the 20% co-insurance for Prolia.   What other option would you like to try?

## 2023-07-15 MED ORDER — ALENDRONATE SODIUM 70 MG PO TABS
70.0000 mg | ORAL_TABLET | ORAL | 2 refills | Status: DC
Start: 2023-07-15 — End: 2023-10-20

## 2023-07-15 NOTE — Addendum Note (Signed)
Addended by: Dierdre Searles on: 07/15/2023 02:52 PM   Modules accepted: Orders

## 2023-07-15 NOTE — Telephone Encounter (Signed)
Called and spoke with patient. She is agreeable to starting on Alendronate once weekly.   I advised pt to take in the morning, on an empty stomach, with a full glass of water and to remain in an upright position for at least 30 minutes after taking.   Pt verbalized understanding, Rx sent to pharmacy.

## 2023-07-15 NOTE — Telephone Encounter (Signed)
Lets try alendronate.  Please ask her if she be okay with that and if so we can prescribe it.

## 2023-07-17 ENCOUNTER — Encounter: Payer: Self-pay | Admitting: Internal Medicine

## 2023-07-17 ENCOUNTER — Ambulatory Visit (INDEPENDENT_AMBULATORY_CARE_PROVIDER_SITE_OTHER): Payer: Medicare HMO | Admitting: Internal Medicine

## 2023-07-17 VITALS — BP 144/78 | HR 62 | Temp 97.9°F | Resp 16 | Ht 63.5 in | Wt 191.8 lb

## 2023-07-17 DIAGNOSIS — M159 Polyosteoarthritis, unspecified: Secondary | ICD-10-CM | POA: Diagnosis not present

## 2023-07-17 DIAGNOSIS — M545 Low back pain, unspecified: Secondary | ICD-10-CM

## 2023-07-17 DIAGNOSIS — I5032 Chronic diastolic (congestive) heart failure: Secondary | ICD-10-CM

## 2023-07-17 DIAGNOSIS — Z515 Encounter for palliative care: Secondary | ICD-10-CM

## 2023-07-17 DIAGNOSIS — G8929 Other chronic pain: Secondary | ICD-10-CM

## 2023-07-17 MED ORDER — HYDROCODONE-ACETAMINOPHEN 10-325 MG PO TABS
1.0000 | ORAL_TABLET | Freq: Four times a day (QID) | ORAL | 0 refills | Status: DC | PRN
Start: 2023-07-17 — End: 2023-08-16

## 2023-07-17 NOTE — Progress Notes (Signed)
Subjective:  Patient ID: [name], female    DOB: October 21, 1933  Age: 89 y.o. MRN: [id number]  CC: Osteoarthritis and Back Pain   HPI [name] presents for f/up ---  She complains of worsening MSK pain and wants to increase the norco dose to QID. The pain keeps her awake and disrupts her sleep.  Outpatient Medications Prior to Visit  Medication Sig Dispense Refill   albuterol (PROVENTIL) (2.5 MG/3ML) 0.083% nebulizer solution Take 3 mLs (2.5 mg total) by nebulization every 6 (six) hours as needed for wheezing or shortness of breath. 225 mL 3   alendronate (FOSAMAX) 70 MG tablet Take 1 tablet (70 mg total) by mouth once a week. Take with a full glass of water on an empty stomach. 4 tablet 2   carvedilol (COREG) 12.5 MG tablet TAKE 1 TABLET(12.5 MG) BY MOUTH TWICE DAILY WITH A MEAL (Patient taking differently: Take 12.5 mg by mouth 2 (two) times daily with a meal.) 180 tablet 1   Cholecalciferol 2000 units TABS Take 2 tablets (4,000 Units total) by mouth daily. 180 tablet 1   clonazePAM (KLONOPIN) 1 MG tablet TAKE 1 TABLET(1 MG) BY MOUTH TWICE DAILY AS NEEDED FOR ANXIETY 60 tablet 3   hydrALAZINE (APRESOLINE) 50 MG tablet TAKE 1 TABLET(50 MG) BY MOUTH TWICE DAILY (Patient taking differently: Take 50 mg by mouth 2 (two) times daily.) 180 tablet 1   omeprazole (PRILOSEC) 40 MG capsule TAKE 1 CAPSULE(40 MG) BY MOUTH DAILY 90 capsule 0   oxybutynin (DITROPAN-XL) 10 MG 24 hr tablet Take 10 mg by mouth daily.     spironolactone (ALDACTONE) 25 MG tablet TAKE 1/2 TABLET(12.5 MG) BY MOUTH DAILY 45 tablet 1   torsemide (DEMADEX) 20 MG tablet TAKE 1 TABLET BY MOUTH EVERY DAY 90 tablet 1   traZODone (DESYREL) 100 MG tablet TAKE 1 TABLET(100 MG) BY MOUTH AT BEDTIME 90 tablet 1   HYDROcodone-acetaminophen (NORCO) 10-325 MG tablet Take 1 tablet by mouth every 8 (eight) hours as needed. 90 tablet 0   Vibegron (GEMTESA) 75 MG TABS Take 75 mg by mouth daily in the afternoon. (Patient not taking: Reported  on 07/17/2023)     No facility-administered medications prior to visit.    ROS Review of Systems  Constitutional: Negative.  Negative for chills, diaphoresis and fatigue.  HENT: Negative.  Negative for trouble swallowing.   Respiratory:  Positive for choking and shortness of breath. Negative for cough, chest tightness and wheezing.   Cardiovascular:  Negative for chest pain, palpitations and leg swelling.  Gastrointestinal:  Negative for abdominal pain, diarrhea, nausea and vomiting.  Endocrine: Negative.   Genitourinary: Negative.  Negative for difficulty urinating.  Musculoskeletal:  Positive for arthralgias, back pain and gait problem. Negative for joint swelling and myalgias.  Skin: Negative.   Neurological:  Negative for dizziness and weakness.  Hematological:  Negative for adenopathy. Does not bruise/bleed easily.  Psychiatric/Behavioral: Negative.      Objective:  BP (!) 144/78 (BP Location: Left Arm, Patient Position: Sitting, Cuff Size: Normal)   Pulse 62   Temp 97.9 F (36.6 C) (Oral)   Resp 16   Ht 5' 3.5" (1.613 m)   Wt 191 lb 12.8 oz (87 kg)   SpO2 95%   BMI 33.44 kg/m   BP Readings from Last 3 Encounters:  07/17/23 (!) 144/78  06/26/23 (!) 164/88  06/16/23 (!) 150/69    Wt Readings from Last 3 Encounters:  07/17/23 191 lb 12.8 oz (87 kg)  06/26/23 191 lb (86.6 kg)  05/15/23 197 lb 9.6 oz (89.6 kg)    Physical Exam Vitals reviewed.  Constitutional:      Appearance: Normal appearance.  HENT:     Mouth/Throat:     Mouth: Mucous membranes are moist.  Eyes:     General: No scleral icterus.    Conjunctiva/sclera: Conjunctivae normal.  Cardiovascular:     Rate and Rhythm: Normal rate and regular rhythm.     Heart sounds: Murmur heard.     Systolic murmur is present with a grade of 1/6.     No diastolic murmur is present.     No friction rub. No gallop.  Pulmonary:     Effort: Pulmonary effort is normal.     Breath sounds: No stridor. No wheezing,  rhonchi or rales.  Abdominal:     General: Abdomen is flat.     Palpations: There is no mass.     Tenderness: There is no abdominal tenderness. There is no guarding.     Hernia: No hernia is present.  Musculoskeletal:        General: Normal range of motion.     Cervical back: Neck supple.     Right lower leg: No edema.     Left lower leg: No edema.  Lymphadenopathy:     Cervical: No cervical adenopathy.  Skin:    General: Skin is warm and dry.  Neurological:     General: No focal deficit present.     Mental Status: She is alert.     Lab Results  Component Value Date   WBC 7.0 02/07/2023   HGB 12.4 02/07/2023   HCT 39.2 02/07/2023   PLT 137 (L) 02/07/2023   GLUCOSE 97 06/26/2023   CHOL 194 09/23/2022   TRIG 198.0 (H) 09/23/2022   HDL 63.70 09/23/2022   LDLDIRECT 121.0 06/12/2017   LDLCALC 90 09/23/2022   ALT 10 06/26/2023   AST 17 06/26/2023   NA 141 06/26/2023   K 4.4 06/26/2023   CL 101 06/26/2023   CREATININE 0.85 06/26/2023   BUN 23 06/26/2023   CO2 34 (H) 06/26/2023   TSH 5.13 09/23/2022   INR 1.0 08/11/2020   HGBA1C 4.8 09/19/2021    No results found.  Assessment & Plan:   Primary osteoarthritis involving multiple joints -     HYDROcodone-Acetaminophen; Take 1 tablet by mouth every 6 (six) hours as needed.  Dispense: 120 tablet; Refill: 0  Encounter for palliative care involving management of pain- Will increase the dose to QID. -     HYDROcodone-Acetaminophen; Take 1 tablet by mouth every 6 (six) hours as needed.  Dispense: 120 tablet; Refill: 0  Chronic bilateral low back pain without sciatica -     HYDROcodone-Acetaminophen; Take 1 tablet by mouth every 6 (six) hours as needed.  Dispense: 120 tablet; Refill: 0  Chronic diastolic CHF (congestive heart failure) (HCC)- She has a normal volume status.     Follow-up: Return in about 3 months (around 10/17/2023).  Sanda Linger, MD

## 2023-07-17 NOTE — Patient Instructions (Signed)

## 2023-07-22 ENCOUNTER — Ambulatory Visit: Payer: Medicare HMO | Admitting: Internal Medicine

## 2023-08-08 ENCOUNTER — Ambulatory Visit (INDEPENDENT_AMBULATORY_CARE_PROVIDER_SITE_OTHER): Payer: Medicare HMO

## 2023-08-08 ENCOUNTER — Ambulatory Visit: Payer: Medicare HMO | Admitting: Sports Medicine

## 2023-08-08 VITALS — Ht 63.0 in | Wt 191.0 lb

## 2023-08-08 DIAGNOSIS — M5030 Other cervical disc degeneration, unspecified cervical region: Secondary | ICD-10-CM | POA: Diagnosis not present

## 2023-08-08 DIAGNOSIS — M542 Cervicalgia: Secondary | ICD-10-CM

## 2023-08-08 DIAGNOSIS — M436 Torticollis: Secondary | ICD-10-CM | POA: Diagnosis not present

## 2023-08-08 DIAGNOSIS — M50321 Other cervical disc degeneration at C4-C5 level: Secondary | ICD-10-CM | POA: Diagnosis not present

## 2023-08-08 MED ORDER — MELOXICAM 15 MG PO TABS: 15.0000 mg | ORAL_TABLET | Freq: Every day | ORAL | 0 refills | Status: DC

## 2023-08-08 NOTE — Patient Instructions (Signed)
-   Start meloxicam 15 mg daily x2 weeks.  If still having pain after 2 weeks, complete 3rd-week of meloxicam. May use remaining meloxicam as needed once daily for pain control.  Do not to use additional NSAIDs while taking meloxicam.  May use Tylenol [phone] mg 2 to 3 times a day for breakthrough pain. Using heating pads  3-4 week follow up

## 2023-08-08 NOTE — Progress Notes (Signed)
[name] D.[facility] [address] [place] [address] Phone: [phone]   Assessment and Plan:     1. Neck pain 2. DDD (degenerative disc disease), cervical -Chronic with exacerbation, initial sports medicine visit - Flare of neck pain most consistent with flare of DDD in cervical spine as seen on x-ray at today's imaging - X-ray obtained in clinic.  My interpretation: Moderate to significant degenerative changes most severe at C1, C3-C5 - Start meloxicam 15 mg daily x2 weeks.  If still having pain after 2 weeks, complete 3rd-week of meloxicam. May use remaining meloxicam as needed once daily for pain control.  Do not to use additional NSAIDs while taking meloxicam.  May use Tylenol [phone] mg 2 to 3 times a day for breakthrough pain. -Continue using heating pads for comfort   Pertinent previous records reviewed include none   Follow Up: 3 to 4 weeks for reevaluation.  If no improvement or worsening of symptoms, could consider advanced imaging to rule out vertebral compression fracture versus physical therapy   Subjective:   I, [name], am serving as a Neurosurgeon for Doctor Richardean Sale  Chief Complaint: neck pain   HPI:   08/08/23 Patient is a 89 year old female complaining of neck pain. Patient states she has had neck pain that started 4 days ago that radiates up to her head. Decreased ROM.muscle relaxer isnt helping with the pain . Heating pad provided a little relief   Relevant Historical Information: Osteoporosis, hypertension, GERD, history of seizures  Additional pertinent review of systems negative.   Current Outpatient Medications:    meloxicam (MOBIC) 15 MG tablet, Take 1 tablet (15 mg total) by mouth daily., Disp: 30 tablet, Rfl: 0   albuterol (PROVENTIL) (2.5 MG/3ML) 0.083% nebulizer solution, Take 3 mLs (2.5 mg total) by nebulization every 6 (six) hours as needed for wheezing or shortness of breath., Disp:  225 mL, Rfl: 3   alendronate (FOSAMAX) 70 MG tablet, Take 1 tablet (70 mg total) by mouth once a week. Take with a full glass of water on an empty stomach., Disp: 4 tablet, Rfl: 2   carvedilol (COREG) 12.5 MG tablet, TAKE 1 TABLET(12.5 MG) BY MOUTH TWICE DAILY WITH A MEAL (Patient taking differently: Take 12.5 mg by mouth 2 (two) times daily with a meal.), Disp: 180 tablet, Rfl: 1   Cholecalciferol 2000 units TABS, Take 2 tablets (4,000 Units total) by mouth daily., Disp: 180 tablet, Rfl: 1   clonazePAM (KLONOPIN) 1 MG tablet, TAKE 1 TABLET(1 MG) BY MOUTH TWICE DAILY AS NEEDED FOR ANXIETY, Disp: 60 tablet, Rfl: 3   hydrALAZINE (APRESOLINE) 50 MG tablet, TAKE 1 TABLET(50 MG) BY MOUTH TWICE DAILY (Patient taking differently: Take 50 mg by mouth 2 (two) times daily.), Disp: 180 tablet, Rfl: 1   HYDROcodone-acetaminophen (NORCO) 10-325 MG tablet, Take 1 tablet by mouth every 6 (six) hours as needed., Disp: 120 tablet, Rfl: 0   omeprazole (PRILOSEC) 40 MG capsule, TAKE 1 CAPSULE(40 MG) BY MOUTH DAILY, Disp: 90 capsule, Rfl: 0   oxybutynin (DITROPAN-XL) 10 MG 24 hr tablet, Take 10 mg by mouth daily., Disp: , Rfl:    spironolactone (ALDACTONE) 25 MG tablet, TAKE 1/2 TABLET(12.5 MG) BY MOUTH DAILY, Disp: 45 tablet, Rfl: 1   torsemide (DEMADEX) 20 MG tablet, TAKE 1 TABLET BY MOUTH EVERY DAY, Disp: 90 tablet, Rfl: 1   traZODone (DESYREL) 100 MG tablet, TAKE 1 TABLET(100 MG) BY MOUTH AT BEDTIME, Disp: 90 tablet,  Rfl: 1   Vibegron (GEMTESA) 75 MG TABS, Take 75 mg by mouth daily in the afternoon. (Patient not taking: Reported on 07/17/2023), Disp: , Rfl:    Objective:     Vitals:   08/08/23 1107  Weight: 191 lb (86.6 kg)  Height: 5\' 3"  (1.6 m)      Body mass index is 33.83 kg/m.    Physical Exam:    Neck Exam: Cervical Spine- Posture normal Skin- normal, intact  Neuro:  Strength-  Right Left   Deltoid (C5) 5/5 5/5  Bicep/Brachioradialis (C5/6) 5/5  5/5  Wrist Extension (C6) 5/5 5/5  Tricep  (C7) 5/5 5/5  Wrist Flexion (C7) 5/5 5/5  Grip (C8) 5/5 5/5  Finger Abduction (T1) 5/5 5/5   Sensation: intact to light touch in upper extremities bilaterally  Spurling's:  negative bilaterally Neck ROM: Significantly reduced sidebending, rotation.  Moderately reduced extension and flexion  TTP: cervical spinous processes, cervical paraspinal, thoracic paraspinal, trapezius    Electronically signed by:  [name] D.[facility] 11:43 AM 08/08/23

## 2023-08-14 ENCOUNTER — Telehealth: Payer: Self-pay | Admitting: Internal Medicine

## 2023-08-14 NOTE — Telephone Encounter (Signed)
Prescription Request  08/14/2023  LOV: 07/17/2023  What is the name of the medication or equipment?  HYDROcodone-acetaminophen (NORCO) 10-325 MG tablet   Have you contacted your pharmacy to request a refill? No   Which pharmacy would you like this sent to?  [facility] #[id number] - [place], [place] - [address] AT [address] [name] [address] Phone: [phone] Fax: [phone]    Patient notified that their request is being sent to the clinical staff for review and that they should receive a response within 2 business days.   Please advise at [facility] [phone]

## 2023-08-16 ENCOUNTER — Other Ambulatory Visit: Payer: Self-pay | Admitting: Internal Medicine

## 2023-08-16 DIAGNOSIS — Z515 Encounter for palliative care: Secondary | ICD-10-CM

## 2023-08-16 DIAGNOSIS — M159 Polyosteoarthritis, unspecified: Secondary | ICD-10-CM

## 2023-08-16 DIAGNOSIS — M545 Low back pain, unspecified: Secondary | ICD-10-CM

## 2023-08-16 MED ORDER — HYDROCODONE-ACETAMINOPHEN 10-325 MG PO TABS
1.0000 | ORAL_TABLET | Freq: Four times a day (QID) | ORAL | 0 refills | Status: DC | PRN
Start: 2023-08-16 — End: 2023-09-15

## 2023-08-19 ENCOUNTER — Encounter (HOSPITAL_BASED_OUTPATIENT_CLINIC_OR_DEPARTMENT_OTHER): Payer: Self-pay | Admitting: Cardiology

## 2023-08-19 ENCOUNTER — Ambulatory Visit (HOSPITAL_BASED_OUTPATIENT_CLINIC_OR_DEPARTMENT_OTHER): Payer: Medicare HMO | Admitting: Cardiology

## 2023-08-19 VITALS — BP 138/78 | HR 88 | Ht 63.0 in | Wt 195.0 lb

## 2023-08-19 DIAGNOSIS — I350 Nonrheumatic aortic (valve) stenosis: Secondary | ICD-10-CM

## 2023-08-19 DIAGNOSIS — R0602 Shortness of breath: Secondary | ICD-10-CM | POA: Diagnosis not present

## 2023-08-19 DIAGNOSIS — I5032 Chronic diastolic (congestive) heart failure: Secondary | ICD-10-CM | POA: Diagnosis not present

## 2023-08-19 DIAGNOSIS — I10 Essential (primary) hypertension: Secondary | ICD-10-CM | POA: Diagnosis not present

## 2023-08-19 MED ORDER — HYDRALAZINE HCL 25 MG PO TABS
25.0000 mg | ORAL_TABLET | Freq: Two times a day (BID) | ORAL | 3 refills | Status: DC
Start: 2023-08-19 — End: 2024-03-05

## 2023-08-19 MED ORDER — CARVEDILOL 12.5 MG PO TABS
12.5000 mg | ORAL_TABLET | Freq: Two times a day (BID) | ORAL | 3 refills | Status: AC
Start: 2023-08-19 — End: ?

## 2023-08-19 MED ORDER — SPIRONOLACTONE 25 MG PO TABS
25.0000 mg | ORAL_TABLET | Freq: Once | ORAL | 3 refills | Status: DC
Start: 2023-08-19 — End: 2023-08-19

## 2023-08-19 MED ORDER — SPIRONOLACTONE 25 MG PO TABS
25.0000 mg | ORAL_TABLET | Freq: Every day | ORAL | 3 refills | Status: AC
Start: 2023-08-19 — End: ?

## 2023-08-19 NOTE — Patient Instructions (Addendum)
Medication Instructions:  Blood pressure medications:  Morning: Carvedilol 12.5 mg Hydralazine 25 mg Torsemide 20 mg Spironolactone 25 mg  Evening: Carvedilol 12.5 mg Hydralazine 25 mg  See if this help with the dizziness.   Follow-Up: At [facility], you and your health needs are our priority.  As part of our continuing mission to provide you with exceptional heart care, we have created designated Provider Care Teams.  These Care Teams include your primary Cardiologist (physician) and Advanced Practice Providers (APPs -  Physician Assistants and Nurse Practitioners) who all work together to provide you with the care you need, when you need it.  We recommend signing up for the patient portal called "MyChart".  Sign up information is provided on this After Visit Summary.  MyChart is used to connect with patients for Virtual Visits (Telemedicine).  Patients are able to view lab/test results, encounter notes, upcoming appointments, etc.  Non-urgent messages can be sent to your provider as well.   To learn more about what you can do with MyChart, go to [email or web address].    Your next appointment:   6 months with Dr. Cristal Deer

## 2023-08-19 NOTE — Progress Notes (Signed)
Cardiology Office Note:  .   Date:  08/19/2023  ID:  AMYTHEST ORAMA, DOB March 25, 1933, MRN [id number] PCP: Etta Grandchild, MD  [facility] Providers Cardiologist:  Jodelle Red, MD {  History of Present Illness: .   [name] is a 89 y.o. female with a hx of chronic diastolic heart failure, hypertension who is seen for follow up today. I initially met her 07/16/19 as a new consult at the request of Etta Grandchild, MD for the evaluation and management of aortic stenosis and concern for diastolic heart failure.   Today: There was confusion with her spironolactone dose; corrected today to reflect the 25 mg daily (which is how she has been taking it and tolerating well).   Overall feels breathing waxes and wanes. Breathing treatments are the most helpful when this happens. Does not feel that she has been retaining fluid.   ROS: Denies chest pain, shortness of breath at rest or with normal exertion. No PND, orthopnea, LE edema or unexpected weight gain. No syncope or palpitations. ROS otherwise negative except as noted.   Studies Reviewed: Marland Kitchen    EKG:       Physical Exam:   VS:  There were no vitals taken for this visit.   Wt Readings from Last 3 Encounters:  08/08/23 191 lb (86.6 kg)  07/17/23 191 lb 12.8 oz (87 kg)  06/26/23 191 lb (86.6 kg)    GEN: Well nourished, well developed in no acute distress HEENT: Normal, moist mucous membranes NECK: No JVD CARDIAC: regular rhythm, normal S1 and S2, no rubs or gallops. 1/6 systolic murmur. VASCULAR: Radial and DP pulses 2+ bilaterally. No carotid bruits RESPIRATORY:  soft velcro crackles most prominent at bases ABDOMEN: Soft, non-tender, non-distended MUSCULOSKELETAL:  Ambulates independently SKIN: Warm and dry, no edema NEUROLOGIC:  Alert and oriented x 3. No focal neuro deficits noted. PSYCHIATRIC:  Normal affect    ASSESSMENT AND PLAN: .    Chronic diastolic heart failure Shortness of breath -appears euvolemic on  exam -reviewed heart failure education, including daily weights, salt avoidance, fluid restrictions -doing much better on daily torsemide -continue spironolactone 25 mg daily -no chest pain, but if symptoms worsen could consider repeat stress test. Declines at this time. -on home O2 with exertion and sleep, has been persistent since covid   Hypertension: prior hospitalization for hypertensive urgency 07/2020 -we trialed cutting back on the hydralazine. Instead of taking 1/2 pill twice a day, she is taking a whole 50 mg pill once a day. She is taking this dose in the AM. We will try to spread this out to 25 mg BID and see if this controls her BP without as much fluctuation -continue carvedilol 12.5 mg BID. Avoid bradycardia due to diastolic dysfunction -continue spirinolactone 25 mg daily -continue torsemide 20 mg daily -cannot tolerate amlodipine, ACEi/ARB, see allergies   Murmur: aortic sclerosis with mild stenosis. Unchanged on exam today   OSA: not tolerating CPAP, on home O2  Dispo: 6 mos  Signed, Jodelle Red, MD   Jodelle Red, MD, PhD, [facility]  [facility]  [place]  [facility] at [facility] at [facility] [address], [address] [place], [place] [address] [phone]

## 2023-08-20 ENCOUNTER — Ambulatory Visit: Payer: Medicare HMO | Admitting: Pulmonary Disease

## 2023-08-23 ENCOUNTER — Other Ambulatory Visit: Payer: Self-pay | Admitting: Internal Medicine

## 2023-08-27 ENCOUNTER — Other Ambulatory Visit: Payer: Self-pay | Admitting: Internal Medicine

## 2023-08-27 DIAGNOSIS — K219 Gastro-esophageal reflux disease without esophagitis: Secondary | ICD-10-CM

## 2023-08-28 NOTE — Progress Notes (Signed)
[name] D.[facility] [address] [place] [address] Phone: [phone]   Assessment and Plan:     1. Neck pain 2. DDD (degenerative disc disease), cervical -Chronic with exacerbation, subsequent visit - Significant improvement in neck pain and increased range of motion after completing 2-week course of meloxicam.  Still consistent with resolved flare of DDD in C-spine - Discontinue meloxicam use - Start Tylenol 500 to 1000 mg tablets 2-3 times a day for day-to-day pain relief - Continue to use heating pads for comfort  3. Localized osteoporosis with current pathological fracture, sequela  -Chronic, stable, subsequent visit - Patient was previously prescribed alendronate 70 mg weekly, however she had questions about how to take medication.  Instructed that alendronate should be taken once a week on an empty stomach with only a little bit of water and no eating or drinking for at least 30 minutes afterwards.  Patient verbalized understanding  Pertinent previous records reviewed include none   Follow Up: As   Subjective:   I, [name], am serving as a Neurosurgeon for Doctor Richardean Sale   Chief Complaint: neck pain    HPI:    08/08/23 Patient is a 89 year old female complaining of neck pain. Patient states she has had neck pain that started 4 days ago that radiates up to her head. Decreased ROM.muscle relaxer isnt helping with the pain . Heating pad provided a little relief   08/29/2023 Patient states that she has improved     Relevant Historical Information: Osteoporosis, hypertension, GERD, history of seizures  Additional pertinent review of systems negative.   Current Outpatient Medications:    albuterol (PROVENTIL) (2.5 MG/3ML) 0.083% nebulizer solution, Take 3 mLs (2.5 mg total) by nebulization every 6 (six) hours as needed for wheezing or shortness of breath., Disp: 225 mL, Rfl: 3   alendronate (FOSAMAX) 70 MG  tablet, Take 1 tablet (70 mg total) by mouth once a week. Take with a full glass of water on an empty stomach., Disp: 4 tablet, Rfl: 2   carvedilol (COREG) 12.5 MG tablet, Take 1 tablet (12.5 mg total) by mouth 2 (two) times daily with a meal., Disp: 180 tablet, Rfl: 3   Cholecalciferol 2000 units TABS, Take 2 tablets (4,000 Units total) by mouth daily., Disp: 180 tablet, Rfl: 1   clonazePAM (KLONOPIN) 1 MG tablet, TAKE 1 TABLET(1 MG) BY MOUTH TWICE DAILY AS NEEDED FOR ANXIETY, Disp: 60 tablet, Rfl: 2   hydrALAZINE (APRESOLINE) 25 MG tablet, Take 1 tablet (25 mg total) by mouth 2 (two) times daily., Disp: 180 tablet, Rfl: 3   HYDROcodone-acetaminophen (NORCO) 10-325 MG tablet, Take 1 tablet by mouth every 6 (six) hours as needed., Disp: 120 tablet, Rfl: 0   meloxicam (MOBIC) 15 MG tablet, Take 1 tablet (15 mg total) by mouth daily., Disp: 30 tablet, Rfl: 0   omeprazole (PRILOSEC) 40 MG capsule, TAKE 1 CAPSULE(40 MG) BY MOUTH DAILY, Disp: 90 capsule, Rfl: 0   oxybutynin (DITROPAN-XL) 10 MG 24 hr tablet, Take 10 mg by mouth daily., Disp: , Rfl:    promethazine (PHENERGAN) 12.5 MG tablet, Take by mouth as needed for nausea or vomiting., Disp: , Rfl:    spironolactone (ALDACTONE) 25 MG tablet, Take 1 tablet (25 mg total) by mouth daily., Disp: 90 tablet, Rfl: 3   torsemide (DEMADEX) 20 MG tablet, TAKE 1 TABLET BY MOUTH EVERY DAY, Disp: 90 tablet, Rfl: 1   traZODone (DESYREL) 100 MG tablet, TAKE  1 TABLET(100 MG) BY MOUTH AT BEDTIME, Disp: 90 tablet, Rfl: 1   Objective:     Vitals:   08/29/23 1126  BP: 132/84  Pulse: 85  SpO2: 98%  Weight: 195 lb (88.5 kg)  Height: 5\' 3"  (1.6 m)      Body mass index is 34.54 kg/m.    Physical Exam:    Neck Exam: Cervical Spine- Posture normal Skin- normal, intact  Neuro:  Strength-  Right Left   Deltoid (C5) 5/5 5/5  Bicep/Brachioradialis (C5/6) 5/5  5/5  Wrist Extension (C6) 5/5 5/5  Tricep (C7) 5/5 5/5  Wrist Flexion (C7) 5/5 5/5  Grip (C8) 5/5  5/5  Finger Abduction (T1) 5/5 5/5   Sensation: intact to light touch in upper extremities bilaterally  Spurling's:  negative bilaterally Neck ROM: Mildly reduced sidebending and rotation.  Normal flexion and extension   NTTP: cervical spinous processes, cervical paraspinal, thoracic paraspinal, trapezius    Electronically signed by:  [name] D[facility] 11:38 AM 08/29/23

## 2023-08-29 ENCOUNTER — Ambulatory Visit: Payer: Medicare HMO | Admitting: Sports Medicine

## 2023-08-29 VITALS — BP 132/84 | HR 85 | Ht 63.0 in | Wt 195.0 lb

## 2023-08-29 DIAGNOSIS — M8080XS Other osteoporosis with current pathological fracture, unspecified site, sequela: Secondary | ICD-10-CM

## 2023-08-29 DIAGNOSIS — M5030 Other cervical disc degeneration, unspecified cervical region: Secondary | ICD-10-CM

## 2023-08-29 DIAGNOSIS — M542 Cervicalgia: Secondary | ICD-10-CM

## 2023-08-29 NOTE — Patient Instructions (Signed)
Stop taking meloxicam  Tylenol [phone] mg 2-3 times a day for pain relief  Take alendronate once a week, on an empty stomach with only a little bit of water No eating or drinking for at least 30 minutes after  As needed follow up

## 2023-09-15 ENCOUNTER — Other Ambulatory Visit: Payer: Self-pay

## 2023-09-15 ENCOUNTER — Telehealth: Payer: Self-pay | Admitting: Internal Medicine

## 2023-09-15 DIAGNOSIS — G8929 Other chronic pain: Secondary | ICD-10-CM

## 2023-09-15 DIAGNOSIS — Z515 Encounter for palliative care: Secondary | ICD-10-CM

## 2023-09-15 DIAGNOSIS — M150 Primary generalized (osteo)arthritis: Secondary | ICD-10-CM

## 2023-09-15 MED ORDER — HYDROCODONE-ACETAMINOPHEN 10-325 MG PO TABS
1.0000 | ORAL_TABLET | Freq: Four times a day (QID) | ORAL | 0 refills | Status: DC | PRN
Start: 2023-09-15 — End: 2023-10-14

## 2023-09-15 NOTE — Telephone Encounter (Signed)
Medication request sent to Dr. Yetta Barre

## 2023-09-15 NOTE — Telephone Encounter (Signed)
Prescription Request  09/15/2023  LOV: 07/17/2023  What is the name of the medication or equipment? hydrocodone  Have you contacted your pharmacy to request a refill? Yes   Which pharmacy would you like this sent to?  [facility] #[id number] - [place], [place] - [address] [place] [address] Phone: [phone] Fax: [phone]     Patient notified that their request is being sent to the clinical staff for review and that they should receive a response within 2 business days.   Please advise at Mobile [phone] (mobile)

## 2023-09-17 ENCOUNTER — Other Ambulatory Visit: Payer: Self-pay | Admitting: Internal Medicine

## 2023-09-17 DIAGNOSIS — J4520 Mild intermittent asthma, uncomplicated: Secondary | ICD-10-CM

## 2023-09-19 ENCOUNTER — Ambulatory Visit: Payer: Medicare HMO | Admitting: Pulmonary Disease

## 2023-10-04 ENCOUNTER — Other Ambulatory Visit: Payer: Self-pay | Admitting: Internal Medicine

## 2023-10-04 DIAGNOSIS — G4700 Insomnia, unspecified: Secondary | ICD-10-CM

## 2023-10-14 ENCOUNTER — Telehealth: Payer: Self-pay | Admitting: Internal Medicine

## 2023-10-14 ENCOUNTER — Other Ambulatory Visit: Payer: Self-pay

## 2023-10-14 DIAGNOSIS — Z515 Encounter for palliative care: Secondary | ICD-10-CM

## 2023-10-14 DIAGNOSIS — M150 Primary generalized (osteo)arthritis: Secondary | ICD-10-CM

## 2023-10-14 DIAGNOSIS — G8929 Other chronic pain: Secondary | ICD-10-CM

## 2023-10-14 NOTE — Telephone Encounter (Signed)
 Medication refill sent to Dr. Yetta Barre

## 2023-10-14 NOTE — Telephone Encounter (Signed)
Prescription Request  10/14/2023  LOV: 07/17/2023  What is the name of the medication or equipment? hydrocodone  Have you contacted your pharmacy to request a refill? Yes   Which pharmacy would you like this sent to?  [facility] #[id number] - [place], [place] - [address] [place] [address] Phone: [phone] Fax: [phone]     Patient notified that their request is being sent to the clinical staff for review and that they should receive a response within 2 business days.   Please advise at [facility] [phone]

## 2023-10-15 MED ORDER — HYDROCODONE-ACETAMINOPHEN 10-325 MG PO TABS
1.0000 | ORAL_TABLET | Freq: Four times a day (QID) | ORAL | 0 refills | Status: DC | PRN
Start: 2023-10-15 — End: 2023-11-12

## 2023-10-20 ENCOUNTER — Other Ambulatory Visit: Payer: Self-pay | Admitting: Family Medicine

## 2023-10-20 ENCOUNTER — Ambulatory Visit: Payer: Medicare HMO | Admitting: Internal Medicine

## 2023-10-20 ENCOUNTER — Encounter: Payer: Self-pay | Admitting: Internal Medicine

## 2023-10-20 VITALS — BP 138/78 | HR 87 | Temp 98.1°F | Resp 16 | Ht 63.0 in | Wt 194.6 lb

## 2023-10-20 DIAGNOSIS — K219 Gastro-esophageal reflux disease without esophagitis: Secondary | ICD-10-CM | POA: Diagnosis not present

## 2023-10-20 DIAGNOSIS — R1319 Other dysphagia: Secondary | ICD-10-CM | POA: Diagnosis not present

## 2023-10-20 DIAGNOSIS — I10 Essential (primary) hypertension: Secondary | ICD-10-CM | POA: Diagnosis not present

## 2023-10-20 DIAGNOSIS — E785 Hyperlipidemia, unspecified: Secondary | ICD-10-CM | POA: Diagnosis not present

## 2023-10-20 DIAGNOSIS — M8080XS Other osteoporosis with current pathological fracture, unspecified site, sequela: Secondary | ICD-10-CM

## 2023-10-20 DIAGNOSIS — Z0001 Encounter for general adult medical examination with abnormal findings: Secondary | ICD-10-CM

## 2023-10-20 DIAGNOSIS — Z23 Encounter for immunization: Secondary | ICD-10-CM | POA: Diagnosis not present

## 2023-10-20 DIAGNOSIS — F411 Generalized anxiety disorder: Secondary | ICD-10-CM

## 2023-10-20 LAB — CBC WITH DIFFERENTIAL/PLATELET
Basophils Absolute: 0 10*3/uL (ref 0.0–0.1)
Basophils Relative: 0.3 % (ref 0.0–3.0)
Eosinophils Absolute: 0.2 10*3/uL (ref 0.0–0.7)
Eosinophils Relative: 2.2 % (ref 0.0–5.0)
HCT: 42.5 % (ref 36.0–46.0)
Hemoglobin: 13.5 g/dL (ref 12.0–15.0)
Lymphocytes Relative: 20.9 % (ref 12.0–46.0)
Lymphs Abs: 1.6 10*3/uL (ref 0.7–4.0)
MCHC: 31.8 g/dL (ref 30.0–36.0)
MCV: 95.1 fL (ref 78.0–100.0)
Monocytes Absolute: 0.4 10*3/uL (ref 0.1–1.0)
Monocytes Relative: 5.6 % (ref 3.0–12.0)
Neutro Abs: 5.5 10*3/uL (ref 1.4–7.7)
Neutrophils Relative %: 71 % (ref 43.0–77.0)
Platelets: 154 10*3/uL (ref 150.0–400.0)
RBC: 4.47 Mil/uL (ref 3.87–5.11)
RDW: 13.8 % (ref 11.5–15.5)
WBC: 7.8 10*3/uL (ref 4.0–10.5)

## 2023-10-20 LAB — BASIC METABOLIC PANEL
BUN: 14 mg/dL (ref 6–23)
CO2: 32 meq/L (ref 19–32)
Calcium: 10.1 mg/dL (ref 8.4–10.5)
Chloride: 101 meq/L (ref 96–112)
Creatinine, Ser: 0.82 mg/dL (ref 0.40–1.20)
GFR: 63.75 mL/min (ref >60.00)
Glucose, Bld: 116 mg/dL — ABNORMAL HIGH (ref 70–99)
Potassium: 4.9 meq/L (ref 3.5–5.1)
Sodium: 139 meq/L (ref 135–145)

## 2023-10-20 LAB — HEPATIC FUNCTION PANEL
ALT: 9 U/L (ref 0–35)
AST: 11 U/L (ref 0–37)
Albumin: 4.2 g/dL (ref 3.5–5.2)
Alkaline Phosphatase: 42 U/L (ref 39–117)
Bilirubin, Direct: 0.1 mg/dL (ref 0.0–0.3)
Total Bilirubin: 0.7 mg/dL (ref 0.2–1.2)
Total Protein: 6.8 g/dL (ref 6.0–8.3)

## 2023-10-20 LAB — LIPID PANEL
Cholesterol: 175 mg/dL (ref 0–200)
HDL: 49.9 mg/dL (ref >39.00)
LDL Cholesterol: 98 mg/dL (ref 0–99)
NonHDL: 125.49
Total CHOL/HDL Ratio: 4
Triglycerides: 135 mg/dL (ref 0.0–149.0)
VLDL: 27 mg/dL (ref 0.0–40.0)

## 2023-10-20 LAB — TSH: TSH: 2.17 u[IU]/mL (ref 0.35–5.50)

## 2023-10-20 NOTE — Progress Notes (Unsigned)
Subjective:  Patient ID: [name], female    DOB: [birth year]  Age: 89 y.o. MRN: [id number]  CC: Annual Exam, Gastroesophageal Reflux, Hypertension, and Hyperlipidemia   HPI [name] presents for a CPX and f/up --  Discussed the use of AI scribe software for clinical note transcription with the patient, who gave verbal consent to proceed.  History of Present Illness   The patient, with a history of arthritis, osteoporosis, and cardiovascular disease, presents with a chief complaint of progressive dysphagia. She describes a sensation of food or pills 'sticking' in her throat, necessitating maneuvers such as turning her head or drinking juice to facilitate swallowing. This has been a longstanding issue but has recently worsened. The patient denies associated pain, but describes the sensation as uncomfortable. She has not previously seen a gastroenterologist or had an esophagogastroduodenoscopy.  In addition to dysphagia, the patient reports a history of ulcers and has to be mindful of her diet. She denies any recent changes in appetite, hematochezia, or melena.  The patient also has a history of cardiovascular disease and is on medication for blood pressure control. She reports shortness of breath but denies any chest pain. She also mentions occasional irregular heartbeats, as noted by her cardiologist.  The patient also reports a history of arthritis and osteoporosis, for which she takes a weekly pill. She denies any recent exacerbation of pain or weakness related to these conditions.       Outpatient Medications Prior to Visit  Medication Sig Dispense Refill   albuterol (PROVENTIL) (2.5 MG/3ML) 0.083% nebulizer solution INHALE CONTENTS OF 1 VIAL PER NEBULIZER EVERY 6 HOURS AS NEEDED FOR WHEEZING OR SHORTNESS OF BREATH 225 mL 3   carvedilol (COREG) 12.5 MG tablet Take 1 tablet (12.5 mg total) by mouth 2 (two) times daily with a meal. 180 tablet 3   Cholecalciferol 2000 units TABS  Take 2 tablets (4,000 Units total) by mouth daily. 180 tablet 1   clonazePAM (KLONOPIN) 1 MG tablet TAKE 1 TABLET(1 MG) BY MOUTH TWICE DAILY AS NEEDED FOR ANXIETY 60 tablet 2   hydrALAZINE (APRESOLINE) 25 MG tablet Take 1 tablet (25 mg total) by mouth 2 (two) times daily. 180 tablet 3   HYDROcodone-acetaminophen (NORCO) 10-325 MG tablet Take 1 tablet by mouth every 6 (six) hours as needed. 120 tablet 0   omeprazole (PRILOSEC) 40 MG capsule TAKE 1 CAPSULE(40 MG) BY MOUTH DAILY 90 capsule 0   oxybutynin (DITROPAN-XL) 10 MG 24 hr tablet Take 10 mg by mouth daily.     promethazine (PHENERGAN) 12.5 MG tablet Take by mouth as needed for nausea or vomiting.     spironolactone (ALDACTONE) 25 MG tablet Take 1 tablet (25 mg total) by mouth daily. 90 tablet 3   torsemide (DEMADEX) 20 MG tablet TAKE 1 TABLET BY MOUTH EVERY DAY 90 tablet 1   traZODone (DESYREL) 100 MG tablet TAKE 1 TABLET(100 MG) BY MOUTH AT BEDTIME 90 tablet 1   alendronate (FOSAMAX) 70 MG tablet TAKE 1 TABLET(70 MG) BY MOUTH 1 TIME A WEEK WITH A FULL GLASS OF WATER AND ON AN EMPTY STOMACH 4 tablet 2   meloxicam (MOBIC) 15 MG tablet Take 1 tablet (15 mg total) by mouth daily. 30 tablet 0   No facility-administered medications prior to visit.    ROS Review of Systems  HENT:  Positive for trouble swallowing (odynophagia and dysphagia). Negative for sore throat and voice change.   Musculoskeletal:  Positive for arthralgias, back pain and gait  problem. Negative for joint swelling.  Psychiatric/Behavioral:  Positive for confusion and decreased concentration. Negative for behavioral problems and suicidal ideas. The patient is not nervous/anxious.     Objective:  BP 138/78 (BP Location: Left Arm, Patient Position: Sitting, Cuff Size: Large)   Pulse 87   Temp 98.1 F (36.7 C) (Oral)   Resp 16   Ht 5\' 3"  (1.6 m)   Wt 194 lb 9.6 oz (88.3 kg)   SpO2 93%   BMI 34.47 kg/m   BP Readings from Last 3 Encounters:  10/20/23 138/78  08/29/23  132/84  08/19/23 138/78    Wt Readings from Last 3 Encounters:  10/20/23 194 lb 9.6 oz (88.3 kg)  08/29/23 195 lb (88.5 kg)  08/19/23 195 lb (88.5 kg)    Physical Exam Cardiovascular:     Rate and Rhythm: Normal rate and regular rhythm. Occasional Extrasystoles are present.    Heart sounds: Murmur heard.     Systolic murmur is present with a grade of 1/6.  Musculoskeletal:     Right lower leg: No edema.     Left lower leg: No edema.     Lab Results  Component Value Date   WBC 7.8 10/20/2023   HGB 13.5 10/20/2023   HCT 42.5 10/20/2023   PLT 154.0 10/20/2023   GLUCOSE 116 (H) 10/20/2023   CHOL 175 10/20/2023   TRIG 135.0 10/20/2023   HDL 49.90 10/20/2023   LDLDIRECT 121.0 06/12/2017   LDLCALC 98 10/20/2023   ALT 9 10/20/2023   AST 11 10/20/2023   NA 139 10/20/2023   K 4.9 10/20/2023   CL 101 10/20/2023   CREATININE 0.82 10/20/2023   BUN 14 10/20/2023   CO2 32 10/20/2023   TSH 2.17 10/20/2023   INR 1.0 08/11/2020   HGBA1C 4.8 09/19/2021    No results found.  Assessment & Plan:  Esophageal dysphagia -     Ambulatory referral to Gastroenterology  Need for immunization against influenza -     Flu Vaccine Trivalent High Dose (Fluad)  Essential hypertension -     CBC with Differential/Platelet; Future -     Basic metabolic panel; Future  Hyperlipidemia with target LDL less than 130 -     Lipid panel; Future -     Hepatic function panel; Future -     TSH; Future  GAD (generalized anxiety disorder) -     TSH; Future  Gastroesophageal reflux disease without esophagitis -     CBC with Differential/Platelet; Future  Encounter for general adult medical examination with abnormal findings     Follow-up: Return in about 3 months (around 01/20/2024).  Sanda Linger, MD

## 2023-10-20 NOTE — Patient Instructions (Signed)

## 2023-10-20 NOTE — Telephone Encounter (Signed)
Rx refill request approved per Dr. Corey's orders. 

## 2023-10-25 ENCOUNTER — Other Ambulatory Visit: Payer: Self-pay | Admitting: Internal Medicine

## 2023-10-25 DIAGNOSIS — I5032 Chronic diastolic (congestive) heart failure: Secondary | ICD-10-CM

## 2023-10-25 DIAGNOSIS — I10 Essential (primary) hypertension: Secondary | ICD-10-CM

## 2023-10-27 NOTE — Progress Notes (Shared)
[facility] - Clinic Note  11/04/2023     CHIEF COMPLAINT Patient presents for No chief complaint on file.   HISTORY OF PRESENT ILLNESS: [name] is a 89 y.o. female who presents to the clinic today for:    Pt is delayed to follow up from 3-4 months to 6 months bc she had covid when she was supposed to come back in January  Referring physician: Etta Grandchild, MD [address] [place],  [place] [address]  HISTORICAL INFORMATION:   Selected notes from the MEDICAL RECORD NUMBER Referred by Dr. Zenaida Niece LEE:  Ocular Hx- PMH-    CURRENT MEDICATIONS: No current outpatient medications on file. (Ophthalmic Drugs)   No current facility-administered medications for this visit. (Ophthalmic Drugs)   Current Outpatient Medications (Other)  Medication Sig   albuterol (PROVENTIL) (2.5 MG/3ML) 0.083% nebulizer solution INHALE CONTENTS OF 1 VIAL PER NEBULIZER EVERY 6 HOURS AS NEEDED FOR WHEEZING OR SHORTNESS OF BREATH   carvedilol (COREG) 12.5 MG tablet Take 1 tablet (12.5 mg total) by mouth 2 (two) times daily with a meal.   Cholecalciferol 2000 units TABS Take 2 tablets (4,000 Units total) by mouth daily.   clonazePAM (KLONOPIN) 1 MG tablet TAKE 1 TABLET(1 MG) BY MOUTH TWICE DAILY AS NEEDED FOR ANXIETY   hydrALAZINE (APRESOLINE) 25 MG tablet Take 1 tablet (25 mg total) by mouth 2 (two) times daily.   HYDROcodone-acetaminophen (NORCO) 10-325 MG tablet Take 1 tablet by mouth every 6 (six) hours as needed.   omeprazole (PRILOSEC) 40 MG capsule TAKE 1 CAPSULE(40 MG) BY MOUTH DAILY   oxybutynin (DITROPAN-XL) 10 MG 24 hr tablet Take 10 mg by mouth daily.   promethazine (PHENERGAN) 12.5 MG tablet TAKE 1 TABLET(12.5 MG) BY MOUTH EVERY 8 HOURS AS NEEDED FOR NAUSEA OR VOMITING   spironolactone (ALDACTONE) 25 MG tablet Take 1 tablet (25 mg total) by mouth daily.   torsemide (DEMADEX) 20 MG tablet TAKE 1 TABLET BY MOUTH EVERY DAY   traZODone (DESYREL) 100 MG tablet TAKE 1  TABLET(100 MG) BY MOUTH AT BEDTIME   No current facility-administered medications for this visit. (Other)   REVIEW OF SYSTEMS:    ALLERGIES Allergies  Allergen Reactions   Amlodipine Other (See Comments)    Reaction:  Dizziness    Enalapril Cough   Losartan Potassium Other (See Comments)    Reaction:  GI pain    Metformin Diarrhea   Morphine And Codeine Itching and Nausea And Vomiting   PAST MEDICAL HISTORY Past Medical History:  Diagnosis Date   Abdominal pain    Anemia    NOS   Anxiety    Back pain    Bruises easily    Chronic diastolic CHF (congestive heart failure) (HCC)    a. 06/2013 EF 65-70%.   Chronic headaches    HISTORY OF    Depression    Diabetes mellitus    type II  DIET CONTROLLED   Diarrhea    Hepatitis    AGE 30S   Hyperlipidemia    Hypertension    Mild aortic stenosis    a. 06/2013 Echo: EF 65-70%, mod LVH with focal basal hypertrophy, very mild AS.   Osteoarthritis    Oxygen desaturation during sleep    USES 2 LITERS BEDTIME   VIA CPAP 06/2012 [facility]    Shortness of breath    WITH EXERTION USES 2 L O2 BEDTIME   Sleep apnea    CPAP WITH  O2 2 LITERS 2013 ([facility])   Wears dentures    Wears glasses    Past Surgical History:  Procedure Laterality Date   ABDOMINAL HYSTERECTOMY     APPENDECTOMY     CHOLECYSTECTOMY     IR KYPHO THORACIC WITH BONE BIOPSY  06/16/2023   JOINT REPLACEMENT     LEFT KNEE    KNEE ARTHROPLASTY Right 03/22/2015   Procedure: COMPUTER ASSISTED TOTAL KNEE ARTHROPLASTY;  Surgeon: Eldred Manges, MD;  Location: [facility] OR;  Service: Orthopedics;  Laterality: Right;   KNEE ARTHROSCOPY     RIGHT   LEFT AND RIGHT HEART CATHETERIZATION WITH CORONARY ANGIOGRAM N/A 10/28/2013   Procedure: LEFT AND RIGHT HEART CATHETERIZATION WITH CORONARY ANGIOGRAM;  Surgeon: Micheline Chapman, MD;  Location: [facility] CATH LAB;  Service: Cardiovascular;  Laterality: N/A;   LEFT HEART CATHETERIZATION WITH CORONARY ANGIOGRAM N/A 11/14/2014   Procedure: LEFT  HEART CATHETERIZATION WITH CORONARY ANGIOGRAM;  Surgeon: Wendall Stade, MD;  Location: [facility] CATH LAB;  Service: Cardiovascular;  Laterality: N/A;   REVISION TOTAL KNEE ARTHROPLASTY  2011   ROTATOR CUFF REPAIR  2010   SHOULDER ARTHROSCOPY  09/17/2012   Procedure: ARTHROSCOPY SHOULDER;  Surgeon: Kennieth Rad, MD;  Location: [facility] OR;  Service: Orthopedics;  Laterality: Left;   TONSILLECTOMY     TONSILLECTOMY     FAMILY HISTORY Family History  Problem Relation Age of Onset   Cancer Father        prostate cancer   Heart disease Sister    Heart disease Brother    Alcohol abuse Other    Hypertension Other    Kidney disease Other    Mental illness Other    Emphysema Brother    SOCIAL HISTORY Social History   Tobacco Use   Smoking status: Never   Smokeless tobacco: Never  Vaping Use   Vaping status: Never Used  Substance Use Topics   Alcohol use: No   Drug use: No       OPHTHALMIC EXAM:  Not recorded     IMAGING AND PROCEDURES  Imaging and Procedures for 11/04/2023          ASSESSMENT/PLAN:    ICD-10-CM   1. Retinal macroaneurysm of right eye  H35.011     2. Subretinal hemorrhage of right eye  H35.61     3. Essential hypertension  I10     4. Hypertensive retinopathy of both eyes  H35.033     5. Diabetes mellitus type 2 without retinopathy (HCC)  E11.9     6. Pseudophakia, both eyes  Z96.1       1,2. Old subretinal hemorrhage from retinal macroaneurysm OD -- improving  - RAM along ST arcades w/ surrounding old SRH (white), pigment clumping and atrophy -- improving  - pt reports elevated BP and hospitalization June 2022 -- SBP 190s-218  - suspect RAM w/ SRH related to severely elevated BP  - pt asymptomatic with BCVA OD 20/25 -- stable  - exam shows white SRH almost resolved -- no active red heme  - FA 6.28.23 -- no leakage to suggest active RAM  - no retinal or ophthalmic interventions indicated or recommended   - monitor  - f/u 9 months, sooner prn --  DFE/OCT  3,4. Hypertensive retinopathy OU - discussed importance of tight BP control and likely relation to #1,2 above - monitor  5. Diabetes mellitus, type 2 without retinopathy  - A1c 4.8 on 10.26.22 - The incidence, risk factors for progression, natural history  and treatment options for diabetic retinopathy  were discussed with patient.   - The need for close monitoring of blood glucose, blood pressure, and serum lipids, avoiding cigarette or any type of tobacco, and the need for long term follow up was also discussed with patient. - monitor  6. Pseudophakia OU  - s/p CE/IOL OU  - IOLs in good position, doing well  - monitor  Ophthalmic Meds Ordered this visit:  No orders of the defined types were placed in this encounter.    No follow-ups on file.  There are no Patient Instructions on file for this visit.   Explained the diagnoses, plan, and follow up with the patient and they expressed understanding.  Patient expressed understanding of the importance of proper follow up care.   This document serves as a record of services personally performed by Karie Chimera, MD, PhD. It was created on their behalf by Charlette Caffey, COT an ophthalmic technician. The creation of this record is the provider's dictation and/or activities during the visit.    Electronically signed by:  Charlette Caffey, COT  10/27/23 8:05 AM   Karie Chimera, M.D., Ph.D. Diseases & Surgery of the Retina and Vitreous [facility]    Abbreviations: M myopia (nearsighted); A astigmatism; H hyperopia (farsighted); P presbyopia; Mrx spectacle prescription;  CTL contact lenses; OD right eye; OS left eye; OU both eyes  XT exotropia; ET esotropia; PEK punctate epithelial keratitis; PEE punctate epithelial erosions; DES dry eye syndrome; MGD meibomian gland dysfunction; ATs artificial tears; PFAT's preservative free artificial tears; NSC nuclear sclerotic cataract; PSC posterior  subcapsular cataract; ERM epi-retinal membrane; PVD posterior vitreous detachment; RD retinal detachment; DM diabetes mellitus; DR diabetic retinopathy; NPDR non-proliferative diabetic retinopathy; PDR proliferative diabetic retinopathy; CSME clinically significant macular edema; DME diabetic macular edema; dbh dot blot hemorrhages; CWS cotton wool spot; POAG primary open angle glaucoma; C/D cup-to-disc ratio; HVF humphrey visual field; GVF goldmann visual field; OCT optical coherence tomography; IOP intraocular pressure; BRVO Branch retinal vein occlusion; CRVO central retinal vein occlusion; CRAO central retinal artery occlusion; BRAO branch retinal artery occlusion; RT retinal tear; SB scleral buckle; PPV pars plana vitrectomy; VH Vitreous hemorrhage; PRP panretinal laser photocoagulation; IVK intravitreal kenalog; VMT vitreomacular traction; MH Macular hole;  NVD neovascularization of the disc; NVE neovascularization elsewhere; AREDS age related eye disease study; ARMD age related macular degeneration; POAG primary open angle glaucoma; EBMD epithelial/anterior basement membrane dystrophy; ACIOL anterior chamber intraocular lens; IOL intraocular lens; PCIOL posterior chamber intraocular lens; Phaco/IOL phacoemulsification with intraocular lens placement; PRK photorefractive keratectomy; LASIK laser assisted in situ keratomileusis; HTN hypertension; DM diabetes mellitus; COPD chronic obstructive pulmonary disease

## 2023-10-28 NOTE — Progress Notes (Signed)
[facility] - Clinic Note  11/03/2023     CHIEF COMPLAINT Patient presents for Retina Follow Up   HISTORY OF PRESENT ILLNESS: [name] is a 89 y.o. female who presents to the clinic today for:   HPI     Retina Follow Up   Patient presents with  Other.  In right eye.  This started 9 months ago.  I, the attending physician,  performed the HPI with the patient and updated documentation appropriately.        Comments   Patient here for 9 months retina follow up for retinal macroaneurysm OD. Patient states vision no better. Needs new glasses. Sometimes has eye pain.      Last edited by Rennis Chris, MD on 11/04/2023  9:03 PM.    Pt states her vision is not doing well, she feels like she needs new glasses, but she has not seen Dr. Zenaida Niece since she started coming here  Referring physician: Diona Foley, MD [address] [place],  [place] [address]  HISTORICAL INFORMATION:   Selected notes from the MEDICAL RECORD NUMBER Referred by Dr. Zenaida Niece LEE:  Ocular Hx- PMH-    CURRENT MEDICATIONS: No current outpatient medications on file. (Ophthalmic Drugs)   No current facility-administered medications for this visit. (Ophthalmic Drugs)   Current Outpatient Medications (Other)  Medication Sig   albuterol (PROVENTIL) (2.5 MG/3ML) 0.083% nebulizer solution INHALE CONTENTS OF 1 VIAL PER NEBULIZER EVERY 6 HOURS AS NEEDED FOR WHEEZING OR SHORTNESS OF BREATH   carvedilol (COREG) 12.5 MG tablet Take 1 tablet (12.5 mg total) by mouth 2 (two) times daily with a meal.   Cholecalciferol 2000 units TABS Take 2 tablets (4,000 Units total) by mouth daily.   clonazePAM (KLONOPIN) 1 MG tablet TAKE 1 TABLET(1 MG) BY MOUTH TWICE DAILY AS NEEDED FOR ANXIETY   hydrALAZINE (APRESOLINE) 25 MG tablet Take 1 tablet (25 mg total) by mouth 2 (two) times daily.   HYDROcodone-acetaminophen (NORCO) 10-325 MG tablet Take 1 tablet by mouth every 6 (six) hours as needed.   omeprazole  (PRILOSEC) 40 MG capsule TAKE 1 CAPSULE(40 MG) BY MOUTH DAILY   oxybutynin (DITROPAN-XL) 10 MG 24 hr tablet Take 10 mg by mouth daily.   promethazine (PHENERGAN) 12.5 MG tablet TAKE 1 TABLET(12.5 MG) BY MOUTH EVERY 8 HOURS AS NEEDED FOR NAUSEA OR VOMITING   spironolactone (ALDACTONE) 25 MG tablet Take 1 tablet (25 mg total) by mouth daily.   torsemide (DEMADEX) 20 MG tablet TAKE 1 TABLET BY MOUTH EVERY DAY   traZODone (DESYREL) 100 MG tablet TAKE 1 TABLET(100 MG) BY MOUTH AT BEDTIME   No current facility-administered medications for this visit. (Other)   REVIEW OF SYSTEMS: ROS   Positive for: Gastrointestinal, Eyes, Respiratory, Psychiatric Negative for: Constitutional, Neurological, Skin, Genitourinary, Musculoskeletal, HENT, Endocrine, Cardiovascular, Allergic/Imm, Heme/Lymph Last edited by Laddie Aquas, COA on 11/03/2023  2:00 PM.     ALLERGIES Allergies  Allergen Reactions   Amlodipine Other (See Comments)    Reaction:  Dizziness    Enalapril Cough   Losartan Potassium Other (See Comments)    Reaction:  GI pain    Metformin Diarrhea   Morphine And Codeine Itching and Nausea And Vomiting   PAST MEDICAL HISTORY Past Medical History:  Diagnosis Date   Abdominal pain    Anemia    NOS   Anxiety    Back pain    Bruises easily    Chronic diastolic CHF (congestive heart failure) (  HCC)    a. 06/2013 EF 65-70%.   Chronic headaches    HISTORY OF    Depression    Diabetes mellitus    type II  DIET CONTROLLED   Diarrhea    Hepatitis    AGE 30S   Hyperlipidemia    Hypertension    Mild aortic stenosis    a. 06/2013 Echo: EF 65-70%, mod LVH with focal basal hypertrophy, very mild AS.   Osteoarthritis    Oxygen desaturation during sleep    USES 2 LITERS BEDTIME   VIA CPAP 06/2012 [facility]    Shortness of breath    WITH EXERTION USES 2 L O2 BEDTIME   Sleep apnea    CPAP WITH O2 2 LITERS 2013 ([facility])   Wears dentures    Wears glasses    Past Surgical History:   Procedure Laterality Date   ABDOMINAL HYSTERECTOMY     APPENDECTOMY     CHOLECYSTECTOMY     IR KYPHO THORACIC WITH BONE BIOPSY  06/16/2023   JOINT REPLACEMENT     LEFT KNEE    KNEE ARTHROPLASTY Right 03/22/2015   Procedure: COMPUTER ASSISTED TOTAL KNEE ARTHROPLASTY;  Surgeon: Eldred Manges, MD;  Location: [facility] OR;  Service: Orthopedics;  Laterality: Right;   KNEE ARTHROSCOPY     RIGHT   LEFT AND RIGHT HEART CATHETERIZATION WITH CORONARY ANGIOGRAM N/A 10/28/2013   Procedure: LEFT AND RIGHT HEART CATHETERIZATION WITH CORONARY ANGIOGRAM;  Surgeon: Micheline Chapman, MD;  Location: [facility] CATH LAB;  Service: Cardiovascular;  Laterality: N/A;   LEFT HEART CATHETERIZATION WITH CORONARY ANGIOGRAM N/A 11/14/2014   Procedure: LEFT HEART CATHETERIZATION WITH CORONARY ANGIOGRAM;  Surgeon: Wendall Stade, MD;  Location: [facility] CATH LAB;  Service: Cardiovascular;  Laterality: N/A;   REVISION TOTAL KNEE ARTHROPLASTY  2011   ROTATOR CUFF REPAIR  2010   SHOULDER ARTHROSCOPY  09/17/2012   Procedure: ARTHROSCOPY SHOULDER;  Surgeon: Kennieth Rad, MD;  Location: [facility];  Service: Orthopedics;  Laterality: Left;   TONSILLECTOMY     TONSILLECTOMY     FAMILY HISTORY Family History  Problem Relation Age of Onset   Cancer Father        prostate cancer   Heart disease Sister    Heart disease Brother    Alcohol abuse Other    Hypertension Other    Kidney disease Other    Mental illness Other    Emphysema Brother    SOCIAL HISTORY Social History   Tobacco Use   Smoking status: Never   Smokeless tobacco: Never  Vaping Use   Vaping status: Never Used  Substance Use Topics   Alcohol use: No   Drug use: No       OPHTHALMIC EXAM:  Base Eye Exam     Visual Acuity (Snellen - Linear)       Right Left   Dist cc 20/30 +2 20/25   Dist ph cc 20/25 NI    Correction: Glasses         Tonometry (Tonopen, 1:57 PM)       Right Left   Pressure 21 21         Pupils       Dark Light Shape React APD    Right 2 1 Round Brisk None   Left 2 1 Round Brisk None         Visual Fields (Counting fingers)       Left Right    Full Full  Extraocular Movement       Right Left    Full, Ortho Full, Ortho         Neuro/Psych     Oriented x3: Yes   Mood/Affect: Normal         Dilation     Both eyes: 1.0% Mydriacyl, 2.5% Phenylephrine @ 1:57 PM           Slit Lamp and Fundus Exam     External Exam       Right Left   External Normal Normal         Slit Lamp Exam       Right Left   Lids/Lashes Dermatochalasis - upper lid Dermatochalasis - upper lid   Conjunctiva/Sclera mild melanosis mild melanosis   Cornea trace PEE, well healed cataract wound, mild arcus Arcus, trace PEE, well healed cataract wound   Anterior Chamber deep and clear deep and clear   Iris Round and dilated, No NVI Round and dilated, No NVI   Lens 3 piece PC IOL in good position 3 piece PC IOL in good position   Anterior Vitreous mild syneresis mild syneresis         Fundus Exam       Right Left   Disc mild Pallor, Sharp rim mild Pallor, Sharp rim, temporal PPA   C/D Ratio 0.3 0.3   Macula Flat, Good foveal reflex, RPE mottling, old, white subretinal heme -- resolved, persistent pigmentation ST macula Flat, Good foveal reflex, RPE mottling, No heme or edema   Vessels attenuated, copper wiring, Tortuous, AV crossing changes, white, fibrotic macroanuerysm along ST arcades with surrounding atrophy and pigment clumping -- stable attenuated, mild tortuosity, mild copper wiring, mild AV crossing changes   Periphery Attached, focal atrophy around ST macroaneurysm, peripheral drusen, no heme Attached, mild midzonal drusen greatest temporally, No heme           Refraction     Wearing Rx       Sphere Cylinder Axis Add   Right -1.00 +1.25 005 +2.50   Left -3.00 +1.50 003 +2.50            IMAGING AND PROCEDURES  Imaging and Procedures for 11/03/2023  OCT, Retina - OU - Both Eyes        Right Eye Quality was good. Central Foveal Thickness: 263. Progression has been stable. Findings include normal foveal contour, no IRF, no SRF, subretinal hyper-reflective material, inner retinal atrophy, outer retinal atrophy, vitreomacular adhesion (Focal SRHM/IRHM superior macula along ST arcades - stably improved).   Left Eye Quality was good. Central Foveal Thickness: 244. Progression has been stable. Findings include normal foveal contour, no IRF, no SRF, vitreomacular adhesion .   Notes *Images captured and stored on drive  Diagnosis / Impression:  OD: Focal SRHM/IRHM superior macula along ST arcades -- stably improved OS: NFP; no IRF/SRF; +VMA  Clinical management:  See below  Abbreviations: NFP - Normal foveal profile. CME - cystoid macular edema. PED - pigment epithelial detachment. IRF - intraretinal fluid. SRF - subretinal fluid. EZ - ellipsoid zone. ERM - epiretinal membrane. ORA - outer retinal atrophy. ORT - outer retinal tubulation. SRHM - subretinal hyper-reflective material. IRHM - intraretinal hyper-reflective material            ASSESSMENT/PLAN:    ICD-10-CM   1. Retinal macroaneurysm of right eye  H35.011 OCT, Retina - OU - Both Eyes    2. Subretinal hemorrhage of right eye  H35.61  3. Essential hypertension  I10     4. Hypertensive retinopathy of both eyes  H35.033     5. Diabetes mellitus type 2 without retinopathy (HCC)  E11.9     6. Pseudophakia, both eyes  Z96.1       1,2. Old subretinal hemorrhage from retinal macroaneurysm OD -- stably improved  - macroaneurysm along ST arcades w/ surrounding old SRH (white), pigment clumping and atrophy -- improved  - pt reports elevated BP and hospitalization June 2022 -- SBP 190s-218  - suspect macroanuerysm w/ SRH related to severely elevated BP  - pt asymptomatic with BCVA OD 20/25 -- stable  - exam shows white SRH almost resolved -- no active red heme  - FA 6.28.23 -- no leakage to suggest  active macroaneurysm  - no retinal or ophthalmic interventions indicated or recommended   - monitor  - f/u 1 year, sooner prn -- DFE/OCT  3,4. Hypertensive retinopathy OU - discussed importance of tight BP control and likely relation to #1,2 above - monitor  5. Diabetes mellitus, type 2 without retinopathy  - A1c 4.8 on 10.26.22 - The incidence, risk factors for progression, natural history and treatment options for diabetic retinopathy  were discussed with patient.   - The need for close monitoring of blood glucose, blood pressure, and serum lipids, avoiding cigarette or any type of tobacco, and the need for long term follow up was also discussed with patient. - monitor  6. Pseudophakia OU  - s/p CE/IOL OU  - IOLs in good position, doing well  - monitor  Ophthalmic Meds Ordered this visit:  No orders of the defined types were placed in this encounter.    Return in about 1 year (around 11/02/2024) for f/u retinal macroaneurysm OD, DFE, OCT.  There are no Patient Instructions on file for this visit.   Explained the diagnoses, plan, and follow up with the patient and they expressed understanding.  Patient expressed understanding of the importance of proper follow up care.   This document serves as a record of services personally performed by Karie Chimera, MD, PhD. It was created on their behalf by Glee Arvin. Manson Passey, OA an ophthalmic technician. The creation of this record is the provider's dictation and/or activities during the visit.    Electronically signed by: Glee Arvin. Manson Passey, OA 11/04/23 9:06 PM  Karie Chimera, M.D., Ph.D. Diseases & Surgery of the Retina and Vitreous [facility]  I have reviewed the above documentation for accuracy and completeness, and I agree with the above. Karie Chimera, M.D., Ph.D. 11/04/23 9:08 PM   Abbreviations: M myopia (nearsighted); A astigmatism; H hyperopia (farsighted); P presbyopia; Mrx spectacle prescription;   CTL contact lenses; OD right eye; OS left eye; OU both eyes  XT exotropia; ET esotropia; PEK punctate epithelial keratitis; PEE punctate epithelial erosions; DES dry eye syndrome; MGD meibomian gland dysfunction; ATs artificial tears; PFAT's preservative free artificial tears; NSC nuclear sclerotic cataract; PSC posterior subcapsular cataract; ERM epi-retinal membrane; PVD posterior vitreous detachment; RD retinal detachment; DM diabetes mellitus; DR diabetic retinopathy; NPDR non-proliferative diabetic retinopathy; PDR proliferative diabetic retinopathy; CSME clinically significant macular edema; DME diabetic macular edema; dbh dot blot hemorrhages; CWS cotton wool spot; POAG primary open angle glaucoma; C/D cup-to-disc ratio; HVF humphrey visual field; GVF goldmann visual field; OCT optical coherence tomography; IOP intraocular pressure; BRVO Branch retinal vein occlusion; CRVO central retinal vein occlusion; CRAO central retinal artery occlusion; BRAO branch retinal artery occlusion; RT retinal  tear; SB scleral buckle; PPV pars plana vitrectomy; VH Vitreous hemorrhage; PRP panretinal laser photocoagulation; IVK intravitreal kenalog; VMT vitreomacular traction; MH Macular hole;  NVD neovascularization of the disc; NVE neovascularization elsewhere; AREDS age related eye disease study; ARMD age related macular degeneration; POAG primary open angle glaucoma; EBMD epithelial/anterior basement membrane dystrophy; ACIOL anterior chamber intraocular lens; IOL intraocular lens; PCIOL posterior chamber intraocular lens; Phaco/IOL phacoemulsification with intraocular lens placement; PRK photorefractive keratectomy; LASIK laser assisted in situ keratomileusis; HTN hypertension; DM diabetes mellitus; COPD chronic obstructive pulmonary disease

## 2023-11-03 ENCOUNTER — Encounter (INDEPENDENT_AMBULATORY_CARE_PROVIDER_SITE_OTHER): Payer: Self-pay | Admitting: Ophthalmology

## 2023-11-03 ENCOUNTER — Ambulatory Visit (INDEPENDENT_AMBULATORY_CARE_PROVIDER_SITE_OTHER): Payer: Medicare HMO | Admitting: Ophthalmology

## 2023-11-03 DIAGNOSIS — H35011 Changes in retinal vascular appearance, right eye: Secondary | ICD-10-CM | POA: Diagnosis not present

## 2023-11-03 DIAGNOSIS — H3561 Retinal hemorrhage, right eye: Secondary | ICD-10-CM

## 2023-11-03 DIAGNOSIS — H35033 Hypertensive retinopathy, bilateral: Secondary | ICD-10-CM | POA: Diagnosis not present

## 2023-11-03 DIAGNOSIS — Z961 Presence of intraocular lens: Secondary | ICD-10-CM | POA: Diagnosis not present

## 2023-11-03 DIAGNOSIS — I10 Essential (primary) hypertension: Secondary | ICD-10-CM | POA: Diagnosis not present

## 2023-11-03 DIAGNOSIS — E119 Type 2 diabetes mellitus without complications: Secondary | ICD-10-CM

## 2023-11-04 ENCOUNTER — Encounter (INDEPENDENT_AMBULATORY_CARE_PROVIDER_SITE_OTHER): Payer: Self-pay | Admitting: Ophthalmology

## 2023-11-04 ENCOUNTER — Encounter (INDEPENDENT_AMBULATORY_CARE_PROVIDER_SITE_OTHER): Payer: Medicare HMO | Admitting: Ophthalmology

## 2023-11-04 DIAGNOSIS — Z961 Presence of intraocular lens: Secondary | ICD-10-CM

## 2023-11-04 DIAGNOSIS — I10 Essential (primary) hypertension: Secondary | ICD-10-CM

## 2023-11-04 DIAGNOSIS — E119 Type 2 diabetes mellitus without complications: Secondary | ICD-10-CM

## 2023-11-04 DIAGNOSIS — H3561 Retinal hemorrhage, right eye: Secondary | ICD-10-CM

## 2023-11-04 DIAGNOSIS — H35011 Changes in retinal vascular appearance, right eye: Secondary | ICD-10-CM

## 2023-11-04 DIAGNOSIS — H35033 Hypertensive retinopathy, bilateral: Secondary | ICD-10-CM

## 2023-11-12 ENCOUNTER — Other Ambulatory Visit: Payer: Self-pay

## 2023-11-12 DIAGNOSIS — M150 Primary generalized (osteo)arthritis: Secondary | ICD-10-CM

## 2023-11-12 DIAGNOSIS — Z515 Encounter for palliative care: Secondary | ICD-10-CM

## 2023-11-12 DIAGNOSIS — M545 Low back pain, unspecified: Secondary | ICD-10-CM

## 2023-11-12 MED ORDER — HYDROCODONE-ACETAMINOPHEN 10-325 MG PO TABS: 1.0000 | ORAL_TABLET | Freq: Four times a day (QID) | ORAL | 0 refills | Status: DC | PRN

## 2023-11-12 NOTE — Telephone Encounter (Signed)
Copied from CRM [id number]. Topic: Clinical - Medication Refill >> Nov 12, 2023  8:32 AM Orinda Kenner C wrote: Most Recent Primary Care Visit:  Provider: Etta Grandchild  Department[facility]  Visit Type: OFFICE VISIT  Date: 10/20/2023  Medication: HYDROcodone-acetaminophen (NORCO) 10-325 MG tablet   Has the patient contacted their pharmacy? Yes, advise to contact the office for approval (Agent: If no, request that the patient contact the pharmacy for the refill. If patient does not wish to contact the pharmacy document the reason why and proceed with request.) (Agent: If yes, when and what did the pharmacy advise?)  Is this the correct pharmacy for this prescription? Yes If no, delete pharmacy and type the correct one.  This is the patient's preferred pharmacy:  [facility] [address] [place] - [address] [place] [address] Phone: [phone] Fax: [phone]  Has the prescription been filled recently?   Is the patient out of the medication? Leaving to [place], [place] this Friday and needs rf urgently. Pt is in pain and is scared to be w/o meds.   Has the patient been seen for an appointment in the last year OR does the patient have an upcoming appointment?   Can we respond through MyChart? No, pls c/b [phone]  Agent: Please be advised that Rx refills may take up to 3 business days. We ask that you follow-up with your pharmacy.

## 2023-11-18 ENCOUNTER — Other Ambulatory Visit (HOSPITAL_COMMUNITY): Payer: Self-pay

## 2023-11-24 ENCOUNTER — Emergency Department (HOSPITAL_BASED_OUTPATIENT_CLINIC_OR_DEPARTMENT_OTHER): Payer: Medicare HMO

## 2023-11-24 ENCOUNTER — Other Ambulatory Visit: Payer: Self-pay

## 2023-11-24 ENCOUNTER — Ambulatory Visit: Payer: Self-pay | Admitting: Internal Medicine

## 2023-11-24 ENCOUNTER — Emergency Department (HOSPITAL_BASED_OUTPATIENT_CLINIC_OR_DEPARTMENT_OTHER)
Admission: EM | Admit: 2023-11-24 | Discharge: 2023-11-24 | Disposition: A | Discharge status: Home / Self Care | Payer: Medicare HMO | Attending: Emergency Medicine | Admitting: Emergency Medicine

## 2023-11-24 ENCOUNTER — Encounter (HOSPITAL_BASED_OUTPATIENT_CLINIC_OR_DEPARTMENT_OTHER): Payer: Self-pay

## 2023-11-24 DIAGNOSIS — K5732 Diverticulitis of large intestine without perforation or abscess without bleeding: Secondary | ICD-10-CM | POA: Insufficient documentation

## 2023-11-24 DIAGNOSIS — J90 Pleural effusion, not elsewhere classified: Secondary | ICD-10-CM | POA: Diagnosis not present

## 2023-11-24 DIAGNOSIS — Z20822 Contact with and (suspected) exposure to covid-19: Secondary | ICD-10-CM | POA: Insufficient documentation

## 2023-11-24 DIAGNOSIS — R0602 Shortness of breath: Secondary | ICD-10-CM

## 2023-11-24 DIAGNOSIS — R42 Dizziness and giddiness: Secondary | ICD-10-CM | POA: Diagnosis not present

## 2023-11-24 DIAGNOSIS — I509 Heart failure, unspecified: Secondary | ICD-10-CM | POA: Diagnosis not present

## 2023-11-24 DIAGNOSIS — R490 Dysphonia: Secondary | ICD-10-CM | POA: Diagnosis not present

## 2023-11-24 DIAGNOSIS — I499 Cardiac arrhythmia, unspecified: Secondary | ICD-10-CM

## 2023-11-24 DIAGNOSIS — K4020 Bilateral inguinal hernia, without obstruction or gangrene, not specified as recurrent: Secondary | ICD-10-CM | POA: Diagnosis not present

## 2023-11-24 DIAGNOSIS — E119 Type 2 diabetes mellitus without complications: Secondary | ICD-10-CM | POA: Diagnosis not present

## 2023-11-24 DIAGNOSIS — K5730 Diverticulosis of large intestine without perforation or abscess without bleeding: Secondary | ICD-10-CM | POA: Diagnosis not present

## 2023-11-24 DIAGNOSIS — I517 Cardiomegaly: Secondary | ICD-10-CM | POA: Diagnosis not present

## 2023-11-24 DIAGNOSIS — I110 Hypertensive heart disease with heart failure: Secondary | ICD-10-CM | POA: Insufficient documentation

## 2023-11-24 DIAGNOSIS — D72829 Elevated white blood cell count, unspecified: Secondary | ICD-10-CM | POA: Diagnosis not present

## 2023-11-24 DIAGNOSIS — R109 Unspecified abdominal pain: Secondary | ICD-10-CM | POA: Diagnosis present

## 2023-11-24 DIAGNOSIS — K5792 Diverticulitis of intestine, part unspecified, without perforation or abscess without bleeding: Secondary | ICD-10-CM | POA: Diagnosis not present

## 2023-11-24 DIAGNOSIS — J9811 Atelectasis: Secondary | ICD-10-CM | POA: Diagnosis not present

## 2023-11-24 LAB — URINALYSIS, MICROSCOPIC (REFLEX)

## 2023-11-24 LAB — TROPONIN I (HIGH SENSITIVITY)
Troponin I (High Sensitivity): 6 ng/L (ref <18)
Troponin I (High Sensitivity): 7 ng/L (ref <18)

## 2023-11-24 LAB — CBC
HCT: 39.9 % (ref 36.0–46.0)
Hemoglobin: 12.8 g/dL (ref 12.0–15.0)
MCH: 30.3 pg (ref 26.0–34.0)
MCHC: 32.1 g/dL (ref 30.0–36.0)
MCV: 94.3 fL (ref 80.0–100.0)
Platelets: 145 10*3/uL — ABNORMAL LOW (ref 150–400)
RBC: 4.23 MIL/uL (ref 3.87–5.11)
RDW: 12.9 % (ref 11.5–15.5)
WBC: 10.9 10*3/uL — ABNORMAL HIGH (ref 4.0–10.5)
nRBC: 0 % (ref 0.0–0.2)

## 2023-11-24 LAB — COMPREHENSIVE METABOLIC PANEL
ALT: 12 U/L (ref 0–44)
AST: 15 U/L (ref 15–41)
Albumin: 3.7 g/dL (ref 3.5–5.0)
Alkaline Phosphatase: 33 U/L — ABNORMAL LOW (ref 38–126)
Anion gap: 7 (ref 5–15)
BUN: 19 mg/dL (ref 8–23)
CO2: 27 mmol/L (ref 22–32)
Calcium: 8.9 mg/dL (ref 8.9–10.3)
Chloride: 103 mmol/L (ref 98–111)
Creatinine, Ser: 0.76 mg/dL (ref 0.44–1.00)
GFR, Estimated: >60 mL/min (ref >60)
Glucose, Bld: 102 mg/dL — ABNORMAL HIGH (ref 70–99)
Potassium: 3.9 mmol/L (ref 3.5–5.1)
Sodium: 137 mmol/L (ref 135–145)
Total Bilirubin: 1.1 mg/dL (ref 0.0–1.2)
Total Protein: 6.3 g/dL — ABNORMAL LOW (ref 6.5–8.1)

## 2023-11-24 LAB — URINALYSIS, ROUTINE W REFLEX MICROSCOPIC
Bilirubin Urine: NEGATIVE
Glucose, UA: NEGATIVE mg/dL
Hgb urine dipstick: NEGATIVE
Ketones, ur: NEGATIVE mg/dL
Nitrite: NEGATIVE
Protein, ur: NEGATIVE mg/dL
Specific Gravity, Urine: 1.01 (ref 1.005–1.030)
pH: 5.5 (ref 5.0–8.0)

## 2023-11-24 LAB — D-DIMER, QUANTITATIVE: D-Dimer, Quant: 0.4 ug{FEU}/mL (ref 0.00–0.50)

## 2023-11-24 LAB — RESP PANEL BY RT-PCR (RSV, FLU A&B, COVID)  RVPGX2
Influenza A by PCR: NEGATIVE
Influenza B by PCR: NEGATIVE
Resp Syncytial Virus by PCR: NEGATIVE
SARS Coronavirus 2 by RT PCR: NEGATIVE

## 2023-11-24 LAB — LIPASE, BLOOD: Lipase: 27 U/L (ref 11–51)

## 2023-11-24 MED ORDER — AMOXICILLIN-POT CLAVULANATE 875-125 MG PO TABS: 1.0000 | ORAL_TABLET | Freq: Two times a day (BID) | ORAL | 0 refills | Status: AC

## 2023-11-24 MED ORDER — IOHEXOL 300 MG/ML  SOLN
100.0000 mL | Freq: Once | INTRAMUSCULAR | Status: AC | PRN
  Administered 2023-11-24: 100 mL via INTRAVENOUS

## 2023-11-24 NOTE — ED Triage Notes (Addendum)
Pt reports hoarse since yesterday. Difficulty breathing and abdominal pain since this am. Pt reports both sides hurt and she is dizzy. Normally wears O2 aat HS and as needed. Left O2 in car

## 2023-11-24 NOTE — Telephone Encounter (Signed)
  Chief Complaint: SOB, Dizziness Symptoms: SOB, Dizziness Frequency: Worsening since this AM Pertinent Negatives: Patient denies Fever, CP Disposition: [x] ED /[] Urgent Care (no appt availability in office) / [] Appointment(In office/virtual)/ []  [facility]/ [] Home Care/ [] Refused Recommended Disposition /[] [facility]/ []  Follow-up with PCP Additional Notes: Pt reports she feels SOB and dizziness that started this AM and has seemed to continue to worsen. This RN can hear pt having some difficulty talking due to SOB, pt reports she is on 2 LPM of O2 continuous at baseline but has since had to increase to 4 LPM today with little to no improvement. She reports dizziness with walking, change in position. She reports she feels stable to ambulate with walker in home at this time and does not feel she needs assistance. Pt is unable to check her Sp02 at home. Pt reports her daughter was waiting for a call to see what recommendations were, pt to call her daughter for transport to ED for eval/treat. This RN educated pt on when to call back/seek emergency services with worsening of symptoms or new concerns. Pt reports she felt well enough to call daughter for transport. Pt verbalized understanding and agrees to plan.   Copied from CRM [id number]. Topic: Clinical - Red Word Triage >> Nov 24, 2023 10:52 AM Truddie Crumble wrote: Red Word that prompted transfer to Nurse Triage: pt called in stating her stomach hurts,oxygen not working like it need to be, when she gets up she is dizzy and she could not talk yesterday Reason for Disposition  [1] MODERATE difficulty breathing (e.g., speaks in phrases, SOB even at rest, pulse 100-120) AND [2] NEW-onset or WORSE than normal  Answer Assessment - Initial Assessment Questions 1. RESPIRATORY STATUS: "Describe your breathing?" (e.g., wheezing, shortness of breath, unable to speak, severe coughing)      SOB, difficulty speaking 2. ONSET: "When did this  breathing problem begin?"      This morning 3. PATTERN "Does the difficult breathing come and go, or has it been constant since it started?"      Constant 4. SEVERITY: "How bad is your breathing?" (e.g., mild, moderate, severe)    - MILD: No SOB at rest, mild SOB with walking, speaks normally in sentences, can lie down, no retractions, pulse < 100.    - MODERATE: SOB at rest, SOB with minimal exertion and prefers to sit, cannot lie down flat, speaks in phrases, mild retractions, audible wheezing, pulse 100-120.    - SEVERE: Very SOB at rest, speaks in single words, struggling to breathe, sitting hunched forward, retractions, pulse > 120      Moderate 5. RECURRENT SYMPTOM: "Have you had difficulty breathing before?" If Yes, ask: "When was the last time?" and "What happened that time?"      Yes 8. CAUSE: "What do you think is causing the breathing problem?"      Unknown, "oxygen not working right" increased from 2 LPM at baseline to 4 LPM with little-no improvement 9. OTHER SYMPTOMS: "Do you have any other symptoms? (e.g., dizziness, runny nose, cough, chest pain, fever)     Cough 10. O2 SATURATION MONITOR:  "Do you use an oxygen saturation monitor (pulse oximeter) at home?" If Yes, ask: "What is your reading (oxygen level) today?" "What is your usual oxygen saturation reading?" (e.g., 95%)       Pt does not have access to monitor  Protocols used: Breathing Difficulty-A-AH

## 2023-11-24 NOTE — Discharge Instructions (Addendum)
Thank you for allowing [place] to be a part of your care today.  You were evaluated in the ED for shortness of breath, dizziness, and abdominal pain.   Your CT scan shows evidence of diverticulitis.  I have sent in 5 days of Augmentin to help treat this.  It is also important to do bowel rest.  This involves a clear liquid diet for a couple of days and gradually advancing your diet.  I also want you to follow up with your primary care doctor.   Follow up with cardiology regarding your irregular heart rhythm today.   Return to the ED if you develop sudden worsening of your symptoms or if you have new concerns.

## 2023-11-24 NOTE — Telephone Encounter (Signed)
FYI

## 2023-11-25 ENCOUNTER — Telehealth: Payer: Self-pay

## 2023-11-25 ENCOUNTER — Other Ambulatory Visit: Payer: Self-pay | Admitting: Internal Medicine

## 2023-11-27 ENCOUNTER — Telehealth: Payer: Self-pay | Admitting: Cardiology

## 2023-11-27 NOTE — Telephone Encounter (Signed)
 Called and spoke to patient.  Patient's concerns: Patient had trouble breathing, shortness of breath on 12/30. Increased O2 from 2L to 4L. She alerted her PCP and they told her to go to ED. Dx diverticulitis, currently on ABT. Pt currently asymptomatic from a cardiac standpoint. Denied CP and palpitations.  Per ED note: Patient with irregular heart rhythm today, no reported history of atrial fibrillation, but does have extensive cardiac history with good outpatient cardiology follow up. Her rate has been normal. Advised patient to follow up with cardiology.   Pt to be seen March 2025. Will route to MD/APP for review. Will call with any updates. Patient verbalized understanding.

## 2023-11-27 NOTE — Telephone Encounter (Signed)
 Patient called stating she was in the emergency room on Monday for stomach issues.  They told her that her heart rhythm was irregular and she should call us to let us know.

## 2023-11-28 NOTE — Telephone Encounter (Signed)
 Left message for patient to call back

## 2023-11-28 NOTE — Telephone Encounter (Signed)
 She has known sinus arrhythmia (irregular beat). This is longstanding and not of concern particularly as she is asymptomatic. EKG in ED difficult to interpret due to image quality. Will review with Dr. Lonni when she is back in clinic.   In interim, we can offer her a 14 day ZIO monitor for assessment of arrhythmia and she can be scheduled for a sooner follow up in 4-6 weeks if she prefers. Also find to wear monitor and leave OV as scheduled 01/2024  Reche GORMAN Finder, NP

## 2023-11-28 NOTE — Telephone Encounter (Signed)
 Pt is calling to check status of this call. Pt wants an appt asap.

## 2023-12-01 NOTE — Telephone Encounter (Signed)
 Called and spoke to patient regarding NP's recommendations. Made aware of Zio monitor and ability to mail monitor or come for a nurse visit for zio placement. Pt politely declined d/t pt having a lot of appointments and being unable to come for nurse visit and having no one to put monitor on at home. Pt will keep current OV 01/2024.

## 2023-12-01 NOTE — Telephone Encounter (Signed)
 Patient returned call

## 2023-12-04 ENCOUNTER — Encounter (HOSPITAL_BASED_OUTPATIENT_CLINIC_OR_DEPARTMENT_OTHER): Payer: Self-pay | Admitting: Cardiology

## 2023-12-04 ENCOUNTER — Ambulatory Visit (HOSPITAL_BASED_OUTPATIENT_CLINIC_OR_DEPARTMENT_OTHER): Payer: Medicare HMO | Admitting: Cardiology

## 2023-12-04 VITALS — BP 128/70 | HR 74 | Ht 63.0 in | Wt 194.8 lb

## 2023-12-04 DIAGNOSIS — I5032 Chronic diastolic (congestive) heart failure: Secondary | ICD-10-CM

## 2023-12-04 DIAGNOSIS — I4891 Unspecified atrial fibrillation: Secondary | ICD-10-CM

## 2023-12-04 DIAGNOSIS — I10 Essential (primary) hypertension: Secondary | ICD-10-CM | POA: Diagnosis not present

## 2023-12-04 DIAGNOSIS — Z09 Encounter for follow-up examination after completed treatment for conditions other than malignant neoplasm: Secondary | ICD-10-CM

## 2023-12-04 MED ORDER — RIVAROXABAN 20 MG PO TABS: 20.0000 mg | ORAL_TABLET | Freq: Every day | ORAL | 3 refills | Status: DC

## 2023-12-04 NOTE — Progress Notes (Signed)
 Cardiology Office Note:  .   Date:  12/04/2023  ID:  [name], DOB [birth year], MRN [id number] PCP: Joshua Debby CROME, MD  [facility] Providers Cardiologist:  Shelda Bruckner, MD {  History of Present Illness: .   [name] is a 89 y.o. female with a hx of chronic diastolic heart failure, hypertension who is seen for follow up today. I initially met her 07/16/19 as a new consult at the request of Joshua Debby CROME, MD for the evaluation and management of aortic stenosis and concern for diastolic heart failure.   Today: Came to the ER 11/24/23 because she was hoarse and felt like she couldn't breath. Reviewed her workup. ECG was irregular, concerning for afib, but there appeared to be possible p waves and baseline was difficult to assess. She now feels back to baseline. Never felt that her heart was racing.  Had discussed a monitor, but on exam her heart was irregular. ECG showed rate controlled afib. Discussed afib at length, risk of CVA, role of anticoagulation, see below.  ROS: Denies chest pain, shortness of breath at rest or with normal exertion. No PND, orthopnea, LE edema or unexpected weight gain. No syncope or palpitations. ROS otherwise negative except as noted.   Studies Reviewed: SABRA    EKG:  EKG Interpretation Date/Time:  Thursday December 04 2023 16:17:14 EST Ventricular Rate:  67 PR Interval:    QRS Duration:  88 QT Interval:  364 QTC Calculation: 384 R Axis:   64  Text Interpretation: Atrial fibrillation Cannot rule out Anteroseptal infarct , age undetermined Confirmed by Bruckner Shelda [phone]) on 12/04/2023 4:19:21 PM    Physical Exam:   VS:  BP 128/70   Pulse 74   Ht 5' 3 (1.6 m)   Wt 194 lb 12.8 oz (88.4 kg)   SpO2 (!) 89% Comment: not wearing Oxygen   BMI 34.51 kg/m    Wt Readings from Last 3 Encounters:  12/04/23 194 lb 12.8 oz (88.4 kg)  10/20/23 194 lb 9.6 oz (88.3 kg)  08/29/23 195 lb (88.5 kg)    GEN: Well nourished, well developed  in no acute distress HEENT: Normal, moist mucous membranes NECK: No JVD CARDIAC: irregularly irregular rhythm, normal S1 and S2, no rubs or gallops. 1/6 systolic murmur. VASCULAR: Radial and DP pulses 2+ bilaterally. No carotid bruits RESPIRATORY:  soft velcro crackles most prominent at bases ABDOMEN: Soft, non-tender, non-distended MUSCULOSKELETAL:  Ambulates independently with rollator today SKIN: Warm and dry, no edema NEUROLOGIC:  Alert and oriented x 3. No focal neuro deficits noted. PSYCHIATRIC:  Normal affect    ASSESSMENT AND PLAN: .    atrial fibrillation, new -ECG 11/24/23 in the ER with irregular complexes. I personally reviewed--it is irregular but there are several complexes that look like they might have p waves -given uncertainty, we discussed Zio monitor -however, on exam she was rate controlled but irregular. Got ECG, in clear afib -CHA2DS2/VAS Stroke Risk Points=5 -We discussed the following: Afib is abnormal rhythm of the top chamber of the heart. It can be triggered by many different things, including stress, infection, surgery, age, etc. The rhythm itself, when not fast, is not life threatening. We make sure the heart rate is not fast, and we talk about the risk of small blood clots forming in the heart. When afib lasts for >48 hours, there is a risk of small clots forming and breaking loose, causing a stroke. We prevent this with blood thinning medication, but these medications  also increase risk for bleeding. We discuss the balance between bleeding and stroke with each individual patient.   -discussed options for anticoagulation. She is amenable. Discussed BID dosing with apixaban vs. Mealtime dosing with Xarelto . She would prefer mealtime dosing. CrCL 71, ordered 20 mg rivaroxaban  daily -recent labs from ER reviewed, Hgb/platelets within her normal range -reviewed watching for bleeding -rate controlled, asymptomatic. Instructed to have her call me if she has any new  symptoms  Chronic diastolic heart failure Shortness of breath -appears euvolemic on exam -reviewed heart failure education, including daily weights, salt avoidance, fluid restrictions -doing much better on daily torsemide  -continue spironolactone  25 mg daily -no chest pain, but if symptoms worsen could consider repeat stress test. Declines at this time. -on home O2 with exertion and sleep, has been persistent since covid   Hypertension: prior hospitalization for hypertensive urgency 07/2020 -we trialed cutting back on the hydralazine . Instead of taking 1/2 pill twice a day, she is taking a whole 50 mg pill once a day. She is taking this dose in the AM. We will try to spread this out to 25 mg BID and see if this controls her BP without as much fluctuation -continue carvedilol  12.5 mg BID. Avoid bradycardia due to diastolic dysfunction -continue spirinolactone 25 mg daily -continue torsemide  20 mg daily -cannot tolerate amlodipine , ACEi/ARB, see allergies   Murmur: aortic sclerosis with mild stenosis. Unchanged on exam today   OSA: not tolerating CPAP, on home O2  Dispo: 2 mos  Total time of encounter: I spent 40 minutes dedicated to the care of this patient on the date of this encounter to include pre-visit review of records, face-to-face time with the patient discussing conditions above, and clinical documentation with the electronic health record. We specifically spent time today discussing afib, what it is, how we manage it, risk of stroke, and recommendation for anticoagulation   Signed, Shelda Bruckner, MD   Shelda Bruckner, MD, PhD, [facility] [place]  [facility]  [place]  [facility] at [facility] [address], [address] [place], [place] [address] [phone]

## 2023-12-04 NOTE — Patient Instructions (Addendum)
 Medication Instructions:  Your physician has recommended you make the following change in your medication:   START Xarelto  20 mg daily  *If you need a refill on your cardiac medications before your next appointment, please call your pharmacy*  Follow-Up: At [facility], you and your health needs are our priority.  As part of our continuing mission to provide you with exceptional heart care, we have created designated Provider Care Teams.  These Care Teams include your primary Cardiologist (physician) and Advanced Practice Providers (APPs -  Physician Assistants and Nurse Practitioners) who all work together to provide you with the care you need, when you need it.  We recommend signing up for the patient portal called MyChart.  Sign up information is provided on this After Visit Summary.  MyChart is used to connect with patients for Virtual Visits (Telemedicine).  Patients are able to view lab/test results, encounter notes, upcoming appointments, etc.  Non-urgent messages can be sent to your provider as well.   To learn more about what you can do with MyChart, go to [email or web address].    Your next appointment:   January 05, 2024 @ 2:20 PM  Provider:   Reche Finder, NP  Other Instructions      Take the blood thinner medication with your biggest meal. If you have issues with bleeding, call us .

## 2023-12-08 ENCOUNTER — Ambulatory Visit: Payer: Medicare HMO | Admitting: Pulmonary Disease

## 2023-12-11 ENCOUNTER — Other Ambulatory Visit: Payer: Self-pay | Admitting: Internal Medicine

## 2023-12-11 DIAGNOSIS — M150 Primary generalized (osteo)arthritis: Secondary | ICD-10-CM

## 2023-12-11 DIAGNOSIS — Z515 Encounter for palliative care: Secondary | ICD-10-CM

## 2023-12-11 DIAGNOSIS — M545 Low back pain, unspecified: Secondary | ICD-10-CM

## 2023-12-11 MED ORDER — HYDROCODONE-ACETAMINOPHEN 10-325 MG PO TABS: 1.0000 | ORAL_TABLET | Freq: Four times a day (QID) | ORAL | 0 refills | Status: DC | PRN

## 2023-12-11 NOTE — Telephone Encounter (Signed)
Copied from CRM [id number]. Topic: Clinical - Medication Refill >> Dec 11, 2023 11:29 AM Adele Barthel wrote: Most Recent Primary Care Visit:  Provider: Etta Grandchild  Department: [facility]  Visit Type: OFFICE VISIT  Date: 10/20/2023  Medication: HYDROcodone-acetaminophen (NORCO) 10-325 MG tablet  Has the patient contacted their pharmacy? Yes (Agent: If no, request that the patient contact the pharmacy for the refill. If patient does not wish to contact the pharmacy document the reason why and proceed with request.) (Agent: If yes, when and what did the pharmacy advise?)  Is this the correct pharmacy for this prescription? Yes If no, delete pharmacy and type the correct one.  This is the patient's preferred pharmacy:   [facility] [address] [place] - [address] [place] [address] Phone: [phone] Fax: [phone]    Has the prescription been filled recently? Yes  Is the patient out of the medication? No  Has the patient been seen for an appointment in the last year OR does the patient have an upcoming appointment? Yes  Can we respond through MyChart? Yes  Agent: Please be advised that Rx refills may take up to 3 business days. We ask that you follow-up with your pharmacy.

## 2024-01-05 ENCOUNTER — Encounter (HOSPITAL_BASED_OUTPATIENT_CLINIC_OR_DEPARTMENT_OTHER): Payer: Self-pay | Admitting: Family

## 2024-01-05 ENCOUNTER — Ambulatory Visit (HOSPITAL_BASED_OUTPATIENT_CLINIC_OR_DEPARTMENT_OTHER): Payer: Medicare HMO | Admitting: Family

## 2024-01-05 VITALS — BP 125/81 | HR 93 | Ht 63.0 in | Wt 189.9 lb

## 2024-01-05 DIAGNOSIS — D6859 Other primary thrombophilia: Secondary | ICD-10-CM | POA: Diagnosis not present

## 2024-01-05 DIAGNOSIS — I10 Essential (primary) hypertension: Secondary | ICD-10-CM

## 2024-01-05 DIAGNOSIS — I5032 Chronic diastolic (congestive) heart failure: Secondary | ICD-10-CM | POA: Diagnosis not present

## 2024-01-05 DIAGNOSIS — I4819 Other persistent atrial fibrillation: Secondary | ICD-10-CM | POA: Diagnosis not present

## 2024-01-05 NOTE — Patient Instructions (Addendum)
 Medication Instructions:  Your symptoms would be a very uncommon side effect of Xarelto . Recommend taking with your largest meal, even if it is around 1pm.  *If you need a refill on your cardiac medications before your next appointment, please call your pharmacy*   Lab Work: Your physician recommends that you return for lab work today- BMP & CBC   Follow-Up: At [facility], you and your health needs are our priority.  As part of our continuing mission to provide you with exceptional heart care, we have created designated Provider Care Teams.  These Care Teams include your primary Cardiologist (physician) and Advanced Practice Providers (APPs -  Physician Assistants and Nurse Practitioners) who all work together to provide you with the care you need, when you need it.  We recommend signing up for the patient portal called "MyChart".  Sign up information is provided on this After Visit Summary.  MyChart is used to connect with patients for Virtual Visits (Telemedicine).  Patients are able to view lab/test results, encounter notes, upcoming appointments, etc.  Non-urgent messages can be sent to your provider as well.   To learn more about what you can do with MyChart, go to [email or web address].    Your next appointment:   Follow up in 2-3 months with Dr. Veryl Gottron

## 2024-01-05 NOTE — Progress Notes (Signed)
 Cardiology Office Note:  .   Date:  01/05/2024  ID:  [name], DOB 08/10/1933, MRN [id number] PCP: Arcadio Knuckles, MD  [facility] Providers Cardiologist:  Sheryle Donning, MD    History of Present Illness: .   [name] is a 89 y.o. female with hx of chronic diastolic heart failure, HTN, atrial fibrillation, murmur (aortic sclerosis without stenosis), OSA intolerant to CPAP (on home O2).   Seen in clinic 12/04/23 after ED visit with dyspnea and concern for new onset atrial fibrillation. EKG in clinic rate controlled atrial fibrillation. Xarelto  initiated. Hydralazine  adjusted to 25mg  BID (was taking 50mg  daily). Piror intolerance to Amlodipine , ACE/ARB.   Presents today for follow up. She has questions about her Xarelto  and reports she is not tolerating it well, but is still taking regularly. She feels she is having to get up more often at night to urinate. No urine malodor nor dysuria. She notes she urinates frequently during the day due to her diuretics. She does not feel she has a UTI and declines a urine sample. She also notes feeling lightheaded and having to hold onto her Alleigh Mollica for stability.  Discussed this would be an uncommon side effects of Xarelto . She has been taking her Xarelto  in the evening with a cup of applesauce. Eats her largest meal at 1pm, discussed taking her Xarelto  with that meal instead. Reports home BP routinely <140/70.   ROS: Please see the history of present illness.    All other systems reviewed and are negative.   Studies Reviewed: .        Cardiac Studies & Procedures      ECHOCARDIOGRAM  ECHOCARDIOGRAM COMPLETE 07/22/2019  Narrative ECHOCARDIOGRAM REPORT    Patient Name:   [name]  Date of Exam: 07/22/2019 Medical Rec #:  [id number]     Height:       65.0 in Accession #:    [id number]    Weight:       231.2 lb Date of Birth:  07/16/1933     BSA:          2.10 m Patient Age:    86 years      BP:           156/81 mmHg Patient  Gender: F             HR:           55 bpm. Exam Location:  [address]   Procedure: 2D Echo, Cardiac Doppler and Color Doppler  Indications:    R06.02 Shortness of breath  History:        Patient has prior history of Echocardiogram examinations, most recent 10/28/2013. Risk Factors: Diabetes, Dyslipidemia, Hypertension and Obesity. Sleep apnea-CPAP. Chronic diastolic heart failure.  Sonographer:    Brigid Canada RDCS Referring Phys: [id number] BRIDGETTE CHRISTOPHER  IMPRESSIONS   1. The left ventricle has normal systolic function with an ejection fraction of 60-65%. The cavity size was normal. Severe basal septal hypertrophy. Left ventricular diastolic Doppler parameters are consistent with pseudonormalization. 2. The right ventricle has normal systolic function. The cavity was normal. There is no increase in right ventricular wall thickness. Right ventricular systolic pressure could not be assessed. 3. There is mild mitral annular calcification present. 4. The aortic valve is tricuspid. Moderate calcification of the aortic valve. Aortic valve regurgitation is trivial by color flow Doppler. 5. The aorta is normal unless otherwise noted. 6. The interatrial septum appears to be lipomatous.  FINDINGS Left Ventricle: The left ventricle has normal systolic function, with an ejection fraction of 60-65%. The cavity size was normal. Severe basal septal hypertrophy. Left ventricular diastolic Doppler parameters are consistent with pseudonormalization. Normal left ventricular filling pressures  Right Ventricle: The right ventricle has normal systolic function. The cavity was normal. There is no increase in right ventricular wall thickness. Right ventricular systolic pressure could not be assessed.  Left Atrium: Left atrial size was normal in size.  Right Atrium: Right atrial size was normal in size.  Interatrial Septum: No atrial level shunt detected by color flow Doppler. Increased  thickness of the atrial septum sparing the fossa ovalis consistent with The interatrial septum appears to be lipomatous.  Pericardium: There is no evidence of pericardial effusion.  Mitral Valve: The mitral valve is normal in structure. There is mild mitral annular calcification present. Mitral valve regurgitation is mild by color flow Doppler.  Tricuspid Valve: The tricuspid valve is normal in structure. Tricuspid valve regurgitation was not visualized by color flow Doppler.  Aortic Valve: The aortic valve is tricuspid Moderate calcification of the aortic valve. Aortic valve regurgitation is trivial by color flow Doppler.  Pulmonic Valve: The pulmonic valve was normal in structure. Pulmonic valve regurgitation was not assessed by color flow Doppler.  Aorta: The aorta is normal unless otherwise noted.  Venous: The inferior vena cava was not well visualized. The inferior vena cava is normal in size with greater than 50% respiratory variability.   +--------------+--------++ LEFT VENTRICLE         +----------------+---------++ +--------------+--------++ Diastology                PLAX 2D                +----------------+---------++ +--------------+--------++ LV e' lateral:  7.18 cm/s LVIDd:        3.60 cm  +----------------+---------++ +--------------+--------++ LV E/e' lateral:11.0      LVIDs:        2.30 cm  +----------------+---------++ +--------------+--------++ LV e' medial:   5.66 cm/s LV PW:        1.40 cm  +----------------+---------++ +--------------+--------++ LV E/e' medial: 14.0      LV IVS:       1.80 cm  +----------------+---------++ +--------------+--------++ LVOT diam:    2.00 cm  +--------------+--------++ LV SV:        36 ml    +--------------+--------++ LV SV Index:  16.19    +--------------+--------++ LVOT Area:    3.14 cm +--------------+--------++                         +--------------+--------++  +---------------+----------++ RIGHT VENTRICLE           +---------------+----------++ RV Basal diam: 3.40 cm    +---------------+----------++ RV S prime:    12.80 cm/s +---------------+----------++ TAPSE (M-mode):3.0 cm     +---------------+----------++  +---------------+-------++-----------++ LEFT ATRIUM           Index       +---------------+-------++-----------++ LA diam:       5.30 cm2.52 cm/m  +---------------+-------++-----------++ LA Vol (A2C):  70.4 ml33.46 ml/m +---------------+-------++-----------++ LA Vol (A4C):  73.5 ml34.94 ml/m +---------------+-------++-----------++ LA Biplane Vol:71.8 ml34.13 ml/m +---------------+-------++-----------++ +------------+---------++-----------++ RIGHT ATRIUM         Index       +------------+---------++-----------++ RA Pressure:3.00 mmHg            +------------+---------++-----------++ RA Area:    13.30 cm            +------------+---------++-----------++  RA Volume:  32.90 ml 15.64 ml/m +------------+---------++-----------++ +------------------+------------++ AORTIC VALVE                   +------------------+------------++ AV Area (Vmax):   3.54 cm     +------------------+------------++ AV Area (Vmean):  3.46 cm     +------------------+------------++ AV Area (VTI):    3.53 cm     +------------------+------------++ AV Vmax:          177.67 cm/s  +------------------+------------++ AV Vmean:         146.333 cm/s +------------------+------------++ AV VTI:           0.399 m      +------------------+------------++ AV Peak Grad:     12.6 mmHg    +------------------+------------++ AV Mean Grad:     9.0 mmHg     +------------------+------------++ LVOT Vmax:        200.00 cm/s  +------------------+------------++ LVOT Vmean:       161.000  cm/s +------------------+------------++ LVOT VTI:         0.448 m      +------------------+------------++ LVOT/AV VTI ratio:1.12         +------------------+------------++  +-------------+-------++ AORTA                +-------------+-------++ Ao Root diam:2.90 cm +-------------+-------++ Ao Asc diam: 3.50 cm +-------------+-------++  +--------------+----------++ +---------------+---------++ MITRAL VALVE             TRICUSPID VALVE          +--------------+----------++ +---------------+---------++ MV Area (PHT):3.27 cm   Estimated RAP: 3.00 mmHg +--------------+----------++ +---------------+---------++ MV PHT:       67.28 msec +--------------+----------++ +--------------+-------+ MV Decel Time:232 msec   SHUNTS                +--------------+----------++ +--------------+-------+ +--------------+----------++ Systemic VTI: 0.45 m  MV E velocity:79.00 cm/s +--------------+-------+ +--------------+----------++ Systemic Diam:2.00 cm MV A velocity:91.20 cm/s +--------------+-------+ +--------------+----------++ MV E/A ratio: 0.87       +--------------+----------++   Gaylyn Keas MD Electronically signed by Gaylyn Keas MD Signature Date/Time: 07/22/2019/3:30:00 PM    Final             Risk Assessment/Calculations:    CHA2DS2-VASc Score = 7   This indicates a 11.2% annual risk of stroke. The patient's score is based upon: CHF History: 1 HTN History: 1 Diabetes History: 1 Stroke History: 0 Vascular Disease History: 1 (aortic atherosclerosis) Age Score: 2 Gender Score: 1            Physical Exam:   VS:  BP 125/81   Pulse 93   Ht 5\' 3"  (1.6 m)   Wt 189 lb 14.4 oz (86.1 kg)   SpO2 96%   BMI 33.64 kg/m    Wt Readings from Last 3 Encounters:  01/05/24 189 lb 14.4 oz (86.1 kg)  12/04/23 194 lb 12.8 oz (88.4 kg)  10/20/23 194 lb 9.6 oz (88.3 kg)    GEN: Well nourished, obesity, well developed in  no acute distress NECK: No JVD; No carotid bruits CARDIAC: IRIR, no murmurs, rubs, gallops RESPIRATORY:  Clear to auscultation without rales, wheezing or rhonchi  ABDOMEN: Soft, non-tender, non-distended EXTREMITIES:  No edema; No deformity   ASSESSMENT AND PLAN: .    Atrial fibrillation / Hypercoagulable state - Rate controlled today. Overall unaware of her atrial fibrillation - continue rate control. She is concerned her Xarelto  is making her feel "funny" at night, lightheaded during the day, and urinating more at night. Discussed these would be uncommon side effects  of Xarelto . She declines transition to Eliquis. Update CBC and BMP for monitoring. If she has persistent concerns, she will let us  or PCP know and consider transition to Eliquis.   HTN - BP well controlled. Continue current antihypertensive regimen Hydralazine  25mg  BID, Spironolactone  25mg  daily, Torsemide  20mg  daily, Coreg  12.5mg  BID.  HFpEF - Euvolemic and well compensated on exam. GDMT includes Coreg  12.5 mg BID, Spironolactone  25mg  daily, Torsemide  20mg  daily. Low sodium diet, fluid restriction <2L, and daily weights encouraged. Educated to contact our office for weight gain of 2 lbs overnight or 5 lbs in one week.        Dispo: follow up in 2-3 months with Dr. Veryl Gottron  Signed, Clearnce Curia, NP

## 2024-01-06 ENCOUNTER — Encounter (HOSPITAL_BASED_OUTPATIENT_CLINIC_OR_DEPARTMENT_OTHER): Payer: Self-pay

## 2024-01-06 LAB — BASIC METABOLIC PANEL
BUN/Creatinine Ratio: 16 (ref 12–28)
BUN: 14 mg/dL (ref 8–27)
CO2: 26 mmol/L (ref 20–29)
Calcium: 9.8 mg/dL (ref 8.7–10.3)
Chloride: 102 mmol/L (ref 96–106)
Creatinine, Ser: 0.86 mg/dL (ref 0.57–1.00)
Glucose: 111 mg/dL — ABNORMAL HIGH (ref 70–99)
Potassium: 4.3 mmol/L (ref 3.5–5.2)
Sodium: 141 mmol/L (ref 134–144)
eGFR: 65 mL/min/{1.73_m2} (ref >59)

## 2024-01-06 LAB — CBC
Hematocrit: 41.6 % (ref 34.0–46.6)
Hemoglobin: 13.7 g/dL (ref 11.1–15.9)
MCH: 31.4 pg (ref 26.6–33.0)
MCHC: 32.9 g/dL (ref 31.5–35.7)
MCV: 95 fL (ref 79–97)
Platelets: 133 10*3/uL — ABNORMAL LOW (ref 150–450)
RBC: 4.37 x10E6/uL (ref 3.77–5.28)
RDW: 12 % (ref 11.7–15.4)
WBC: 9.9 10*3/uL (ref 3.4–10.8)

## 2024-01-07 ENCOUNTER — Telehealth (HOSPITAL_BASED_OUTPATIENT_CLINIC_OR_DEPARTMENT_OTHER): Payer: Self-pay

## 2024-01-07 NOTE — Telephone Encounter (Signed)
-----   Message from Alver Sorrow sent at 01/06/2024  8:16 AM EST ----- CBC with no evidence of anemia nor infection.  Normal kidney function and electrolytes. Good result!   Provider note only: mild thrombocytopenia stable from previous, not of concern

## 2024-01-07 NOTE — Telephone Encounter (Signed)
Called and spoke to patient on lab results. She verbalized understanding and was thankful for call.

## 2024-01-08 ENCOUNTER — Other Ambulatory Visit: Payer: Self-pay | Admitting: Internal Medicine

## 2024-01-08 DIAGNOSIS — G8929 Other chronic pain: Secondary | ICD-10-CM

## 2024-01-08 DIAGNOSIS — M150 Primary generalized (osteo)arthritis: Secondary | ICD-10-CM

## 2024-01-08 DIAGNOSIS — Z515 Encounter for palliative care: Secondary | ICD-10-CM

## 2024-01-08 MED ORDER — HYDROCODONE-ACETAMINOPHEN 10-325 MG PO TABS: 1.0000 | ORAL_TABLET | Freq: Four times a day (QID) | ORAL | 0 refills | Status: DC | PRN

## 2024-01-08 NOTE — Transitions of Care (Post Inpatient/ED Visit) (Signed)
   01/08/2024  Name: [name] MRN: [id number] DOB: [birth year]  Today's TOC FU Call Status:    Attempted to reach the patient regarding the most recent Inpatient/ED visit.  Follow Up Plan: No further outreach attempts will be made at this time. We have been unable to contact the patient.  Signature Karena Addison, LPN [facility] Nurse Health Advisor Direct Dial [phone]

## 2024-01-08 NOTE — Telephone Encounter (Signed)
Copied from CRM [id number]. Topic: Clinical - Medication Refill >> Jan 08, 2024  9:09 AM Aletta Edouard wrote: Most Recent Primary Care Visit:  Provider: Etta Grandchild  Department: [facility]  Visit Type: OFFICE VISIT  Date: 10/20/2023  Medication: HYDROcodone-acetaminophen (NORCO) 10-  Has the patient contacted their pharmacy? No (Agent: If no, request that the patient contact the pharmacy for the refill. If patient does not wish to contact the pharmacy document the reason why and proceed with request.) (Agent: If yes, when and what did the pharmacy advise?)  Is this the correct pharmacy for this prescription? Yes If no, delete pharmacy and type the correct one.  This is the patient's preferred pharmacy:  [facility] [address], [place] - [address] [place] [address] Phone: [phone] Fax: [phone]   Has the prescription been filled recently? No  Is the patient out of the medication? Yes  Has the patient been seen for an appointment in the last year OR does the patient have an upcoming appointment? Yes  Can we respond through MyChart? No  Agent: Please be advised that Rx refills may take up to 3 business days. We ask that you follow-up with your pharmacy.

## 2024-01-16 ENCOUNTER — Telehealth: Payer: Self-pay | Admitting: Cardiology

## 2024-01-16 DIAGNOSIS — I4891 Unspecified atrial fibrillation: Secondary | ICD-10-CM

## 2024-01-16 NOTE — Telephone Encounter (Signed)
Pt c/o medication issue:  1. Name of Medication: rivaroxaban (XARELTO) 20 MG TABS tablet   2. How are you currently taking this medication (dosage and times per day)?    3. Are you having a reaction (difficulty breathing--STAT)? no  4. What is your medication issue? Patient states that she is unable to afford medication. Calling to see what other options are there. Please advise

## 2024-01-16 NOTE — Telephone Encounter (Signed)
Her plan has annual Rx deductible of: $250. This means the first time she picks up Xarelto she will have to pay the $250 deductible plus her Xarelto copay of $47. So initial fill will cost ~$300. Thereafter, it will just be $47 per month.   Would recommend calling to see if it is the initial deductible or the subsequent copays that are cost concern. The $250 is just a one time fee and unfortunately there is not a way around paying it.   Alver Sorrow, NP

## 2024-01-16 NOTE — Telephone Encounter (Signed)
Alternative would be Pradaxa. Pradaxa is generic but cost is $67 per month which would end up costing her very similar  ($804 for whole year) than paying for the deductible and her lower copay ($161 for the year).  Alternative will be to start Warfarin which requires monthly visits with pharmacy team for a finger stick to ensure her levels are optimized. Additionally requires her to monitor vitamin K in her diet. If she wishes to switch knowing these modifications, she can.   Aspirin is an antiplatelet not an anticoagulant (blood thinner) and does not provide adequate protections. If she does not take anticoagulant regularly her annual risk of stroke is 11.2% due to her atrial fibrillation.   Alver Sorrow, NP

## 2024-01-16 NOTE — Telephone Encounter (Signed)
Called and spoke to pt. Pt is unable to afford $250 deductible. Explained to pt that $250 deductible needs to be paid before $47/mo copay takes into effect due to insurance plan. Pt requested a cheaper blood thinner from provider and to call her back - until then pt stated she will take ASA 81 for the meantime.

## 2024-01-19 MED ORDER — RIVAROXABAN 20 MG PO TABS: 20.0000 mg | ORAL_TABLET | Freq: Every day | ORAL | 1 refills | Status: AC

## 2024-01-19 NOTE — Telephone Encounter (Signed)
 Called and spoke to patient; discussed med alternatives. Patient educated on 1 time medication deductible. Patient would like to stay with Xarelto. Pt stated she nay need another refill. Will send to anticoag pool.

## 2024-01-19 NOTE — Telephone Encounter (Signed)
 Prescription refill request for Xarelto received.  Indication: Afib  Last office visit: 01/05/24 Dan Humphreys)  Weight: 86.1kg Age: 89 Scr: 0.86 (01/05/24)  CrCl: 61.61ml/min  Appropriate dose. Refill sent.

## 2024-01-28 ENCOUNTER — Ambulatory Visit (INDEPENDENT_AMBULATORY_CARE_PROVIDER_SITE_OTHER): Payer: Medicare HMO | Admitting: Internal Medicine

## 2024-01-28 ENCOUNTER — Encounter: Payer: Self-pay | Admitting: Internal Medicine

## 2024-01-28 VITALS — BP 138/78 | HR 92 | Temp 97.9°F | Resp 16 | Ht 63.0 in | Wt 189.4 lb

## 2024-01-28 DIAGNOSIS — D696 Thrombocytopenia, unspecified: Secondary | ICD-10-CM

## 2024-01-28 DIAGNOSIS — Z515 Encounter for palliative care: Secondary | ICD-10-CM

## 2024-01-28 DIAGNOSIS — M545 Low back pain, unspecified: Secondary | ICD-10-CM

## 2024-01-28 DIAGNOSIS — F0390 Unspecified dementia without behavioral disturbance: Secondary | ICD-10-CM | POA: Diagnosis not present

## 2024-01-28 DIAGNOSIS — M150 Primary generalized (osteo)arthritis: Secondary | ICD-10-CM

## 2024-01-28 DIAGNOSIS — I10 Essential (primary) hypertension: Secondary | ICD-10-CM | POA: Diagnosis not present

## 2024-01-28 DIAGNOSIS — G8929 Other chronic pain: Secondary | ICD-10-CM | POA: Diagnosis not present

## 2024-01-29 DIAGNOSIS — D696 Thrombocytopenia, unspecified: Secondary | ICD-10-CM | POA: Insufficient documentation

## 2024-01-29 MED ORDER — HYDROCODONE-ACETAMINOPHEN 10-325 MG PO TABS: 1.0000 | ORAL_TABLET | Freq: Four times a day (QID) | ORAL | 0 refills | Status: AC | PRN

## 2024-01-29 NOTE — Progress Notes (Signed)
 Subjective:  Patient ID: [name], female    DOB: May 30, 1933  Age: 89 y.o. MRN: [id number]  CC: Osteoarthritis, Hypertension, and Gastroesophageal Reflux   HPI [name] presents for f/up ----  Discussed the use of AI scribe software for clinical note transcription with the patient, who gave verbal consent to proceed.  History of Present Illness   [name] is an 89 year old female with diverticulitis who presents with abdominal pain.  She denies abdominal pain. She sometimes takes medication at night to alleviate the pain and finds some relief by sitting up or sleeping in a chair.  She mentions occasional episodes of feeling hot at night, describing it as 'sometimes I be hot at night and I be up under cover.' No current fever or chills are reported.  She is currently taking hydrocodone, which she requests to be refilled. She also mentions being on a blood thinner, which was prescribed after a previous visit to cardiology.  No current abdominal pain, fever, chills, or leg swelling.       Outpatient Medications Prior to Visit  Medication Sig Dispense Refill   albuterol (PROVENTIL) (2.5 MG/3ML) 0.083% nebulizer solution INHALE CONTENTS OF 1 VIAL PER NEBULIZER EVERY 6 HOURS AS NEEDED FOR WHEEZING OR SHORTNESS OF BREATH 225 mL 3   carvedilol (COREG) 12.5 MG tablet Take 1 tablet (12.5 mg total) by mouth 2 (two) times daily with a meal. 180 tablet 3   Cholecalciferol 2000 units TABS Take 2 tablets (4,000 Units total) by mouth daily. 180 tablet 1   clonazePAM (KLONOPIN) 1 MG tablet TAKE 1 TABLET(1 MG) BY MOUTH TWICE DAILY AS NEEDED FOR ANXIETY 60 tablet 2   hydrALAZINE (APRESOLINE) 25 MG tablet Take 1 tablet (25 mg total) by mouth 2 (two) times daily. 180 tablet 3   omeprazole (PRILOSEC) 40 MG capsule TAKE 1 CAPSULE(40 MG) BY MOUTH DAILY 90 capsule 0   oxybutynin (DITROPAN-XL) 10 MG 24 hr tablet Take 10 mg by mouth daily.     promethazine (PHENERGAN) 12.5 MG tablet TAKE 1  TABLET(12.5 MG) BY MOUTH EVERY 8 HOURS AS NEEDED FOR NAUSEA OR VOMITING 20 tablet 2   rivaroxaban (XARELTO) 20 MG TABS tablet Take 1 tablet (20 mg total) by mouth daily with supper. 90 tablet 1   spironolactone (ALDACTONE) 25 MG tablet Take 1 tablet (25 mg total) by mouth daily. 90 tablet 3   torsemide (DEMADEX) 20 MG tablet TAKE 1 TABLET BY MOUTH EVERY DAY 90 tablet 1   traZODone (DESYREL) 100 MG tablet TAKE 1 TABLET(100 MG) BY MOUTH AT BEDTIME 90 tablet 1   HYDROcodone-acetaminophen (NORCO) 10-325 MG tablet Take 1 tablet by mouth every 6 (six) hours as needed. 120 tablet 0   No facility-administered medications prior to visit.    ROS Review of Systems  Constitutional: Negative.  Negative for appetite change, fatigue and fever.  HENT: Negative.    Eyes: Negative.   Respiratory:  Negative for cough, chest tightness and wheezing.   Cardiovascular:  Negative for chest pain, palpitations and leg swelling.  Gastrointestinal: Negative.  Negative for abdominal pain, constipation, diarrhea, nausea and vomiting.  Endocrine: Negative.   Genitourinary: Negative.  Negative for difficulty urinating.  Musculoskeletal:  Positive for arthralgias and back pain. Negative for myalgias.  Skin: Negative.   Neurological: Negative.  Negative for dizziness and weakness.  Hematological:  Negative for adenopathy. Does not bruise/bleed easily.  Psychiatric/Behavioral:  Positive for confusion and decreased concentration. Negative for sleep disturbance. The  patient is nervous/anxious.     Objective:  BP 138/78 (BP Location: Left Arm, Patient Position: Sitting, Cuff Size: Normal)   Pulse 92   Temp 97.9 F (36.6 C) (Oral)   Resp 16   Ht 5\' 3"  (1.6 m)   Wt 189 lb 6.4 oz (85.9 kg)   SpO2 95%   BMI 33.55 kg/m   BP Readings from Last 3 Encounters:  01/28/24 138/78  01/05/24 125/81  12/04/23 128/70    Wt Readings from Last 3 Encounters:  01/28/24 189 lb 6.4 oz (85.9 kg)  01/05/24 189 lb 14.4 oz (86.1 kg)   12/04/23 194 lb 12.8 oz (88.4 kg)    Physical Exam Vitals reviewed.  Constitutional:      Appearance: Normal appearance.  HENT:     Nose: Nose normal.     Mouth/Throat:     Mouth: Mucous membranes are moist.  Eyes:     General: No scleral icterus.    Conjunctiva/sclera: Conjunctivae normal.  Cardiovascular:     Rate and Rhythm: Normal rate and regular rhythm.     Heart sounds: Murmur heard.     Systolic murmur is present with a grade of 1/6.     No friction rub. No gallop.  Pulmonary:     Effort: Pulmonary effort is normal.     Breath sounds: No stridor. No wheezing, rhonchi or rales.  Abdominal:     General: Abdomen is protuberant. Bowel sounds are normal. There is no distension.     Palpations: There is no hepatomegaly or splenomegaly.     Tenderness: There is no abdominal tenderness.  Musculoskeletal:        General: Normal range of motion.     Cervical back: Neck supple.     Right lower leg: No edema.     Left lower leg: No edema.  Lymphadenopathy:     Cervical: No cervical adenopathy.  Skin:    General: Skin is warm and dry.  Neurological:     General: No focal deficit present.     Mental Status: She is alert. Mental status is at baseline.  Psychiatric:        Attention and Perception: She is inattentive.        Mood and Affect: Mood is anxious.        Speech: Speech is delayed and tangential.        Behavior: Behavior normal.        Thought Content: Thought content normal.        Cognition and Memory: Cognition is impaired. Memory is impaired.     Lab Results  Component Value Date   WBC 9.9 01/05/2024   HGB 13.7 01/05/2024   HCT 41.6 01/05/2024   PLT 133 (L) 01/05/2024   GLUCOSE 111 (H) 01/05/2024   CHOL 175 10/20/2023   TRIG 135.0 10/20/2023   HDL 49.90 10/20/2023   LDLDIRECT 121.0 06/12/2017   LDLCALC 98 10/20/2023   ALT 12 11/24/2023   AST 15 11/24/2023   NA 141 01/05/2024   K 4.3 01/05/2024   CL 102 01/05/2024   CREATININE 0.86 01/05/2024    BUN 14 01/05/2024   CO2 26 01/05/2024   TSH 2.17 10/20/2023   INR 1.0 08/11/2020   HGBA1C 4.8 09/19/2021    CT ABDOMEN PELVIS W CONTRAST Result Date: 11/24/2023 CLINICAL DATA:  Difficulty breathing, abdominal pain since this morning, dizziness EXAM: CT ABDOMEN AND PELVIS WITH CONTRAST TECHNIQUE: Multidetector CT imaging of the abdomen and pelvis was performed using  the standard protocol following bolus administration of intravenous contrast. RADIATION DOSE REDUCTION: This exam was performed according to the departmental dose-optimization program which includes automated exposure control, adjustment of the mA and/or kV according to patient size and/or use of iterative reconstruction technique. CONTRAST:  OMNIPAQUE IOHEXOL 300 MG/ML  SOLN COMPARISON:  04/28/2021 FINDINGS: Lower chest: Chronic right basilar scarring. No acute pleural or parenchymal lung disease. Hepatobiliary: No focal liver abnormality is seen. Status post cholecystectomy. No biliary dilatation. Pancreas: Unremarkable. No pancreatic ductal dilatation or surrounding inflammatory changes. Spleen: Normal in size without focal abnormality. Adrenals/Urinary Tract: 3.1 x 2.0 cm right adrenal nodule, previously measuring 3.0 x 1.8 cm, indeterminate. Left adrenal is unremarkable. No urinary tract calculi or obstructive uropathy within either kidney. Bladder is unremarkable. Stomach/Bowel: No bowel obstruction or ileus. There is diffuse diverticulosis throughout the descending and sigmoid colon, with long segment wall thickening of the sigmoid colon and surrounding fat stranding consistent with acute uncomplicated diverticulitis. No evidence of perforation, fluid collection, or abscess. Appendix is surgically absent. Small hiatal hernia. Vascular/Lymphatic: Aortic atherosclerosis. No enlarged abdominal or pelvic lymph nodes. Reproductive: Status post hysterectomy. No adnexal masses. Other: No free fluid or free intraperitoneal gas. No  abdominal wall hernia scratch small fat containing bilateral inguinal hernias, stable. No bowel herniation. Musculoskeletal: No acute or destructive bony abnormalities. Symmetrical bilateral hip osteoarthritis. Chronic T12 compression deformity with prior vertebral augmentation. Reconstructed images demonstrate no additional findings. IMPRESSION: 1. Long segment wall thickening and pericolonic fat stranding involving the sigmoid colon, consistent with acute uncomplicated diverticulitis. No perforation, fluid collection, or abscess. 2. Stable indeterminate right adrenal nodule, likely adenoma based on lack of significant interval change dating to 2019. 3.  Aortic Atherosclerosis (ICD10-I70.0). 4. Stable bilateral fat containing inguinal hernias. Electronically Signed   By: Sharlet Salina M.D.   On: 11/24/2023 17:11   DG Chest Portable 1 View Result Date: 11/24/2023 CLINICAL DATA:  shortness of breath EXAM: PORTABLE CHEST 1 VIEW COMPARISON:  02/07/2023 chest radiograph. FINDINGS: Stable cardiomediastinal silhouette with mild cardiomegaly. No pneumothorax. Small right pleural effusion. No left pleural effusion. No overt pulmonary edema. Minimal streaky bibasilar atelectasis. IMPRESSION: 1. Small right pleural effusion. 2. Minimal streaky bibasilar atelectasis. 3. Stable mild cardiomegaly.  No overt pulmonary edema. Electronically Signed   By: Delbert Phenix M.D.   On: 11/24/2023 14:47    Assessment & Plan:   Essential hypertension- Her BP is well controlled.  Dementia arising in the senium and presenium (HCC)- Stable.  Encounter for palliative care involving management of pain -     HYDROcodone-Acetaminophen; Take 1 tablet by mouth every 6 (six) hours as needed.  Dispense: 120 tablet; Refill: 0  Chronic bilateral low back pain without sciatica -     HYDROcodone-Acetaminophen; Take 1 tablet by mouth every 6 (six) hours as needed.  Dispense: 120 tablet; Refill: 0  Primary osteoarthritis involving  multiple joints -     HYDROcodone-Acetaminophen; Take 1 tablet by mouth every 6 (six) hours as needed.  Dispense: 120 tablet; Refill: 0  Thrombocytopenia (HCC)- No B/B. Will recheck in 3 months.     Follow-up: No follow-ups on file.  Sanda Linger, MD

## 2024-01-30 DIAGNOSIS — H04123 Dry eye syndrome of bilateral lacrimal glands: Secondary | ICD-10-CM | POA: Diagnosis not present

## 2024-02-02 ENCOUNTER — Other Ambulatory Visit: Payer: Self-pay

## 2024-02-02 ENCOUNTER — Encounter (HOSPITAL_COMMUNITY): Payer: Self-pay

## 2024-02-02 ENCOUNTER — Emergency Department (HOSPITAL_COMMUNITY)
Admission: EM | Admit: 2024-02-02 | Discharge: 2024-02-02 | Discharge status: Against Medical Advice | Attending: Emergency Medicine | Admitting: Emergency Medicine

## 2024-02-02 DIAGNOSIS — M549 Dorsalgia, unspecified: Secondary | ICD-10-CM | POA: Insufficient documentation

## 2024-02-02 DIAGNOSIS — Z5321 Procedure and treatment not carried out due to patient leaving prior to being seen by health care provider: Secondary | ICD-10-CM | POA: Diagnosis not present

## 2024-02-02 DIAGNOSIS — R1110 Vomiting, unspecified: Secondary | ICD-10-CM | POA: Insufficient documentation

## 2024-02-02 DIAGNOSIS — M545 Low back pain, unspecified: Secondary | ICD-10-CM | POA: Diagnosis not present

## 2024-02-02 MED ORDER — HYDROCODONE-ACETAMINOPHEN 5-325 MG PO TABS
2.0000 | ORAL_TABLET | Freq: Once | ORAL | Status: AC
  Administered 2024-02-02: 2 via ORAL
  Filled 2024-02-02: qty 2
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
## 2024-02-02 NOTE — ED Notes (Addendum)
 Pt refused bloodwork as per phlebotomist

## 2024-02-08 ENCOUNTER — Emergency Department (HOSPITAL_COMMUNITY)
Admission: EM | Admit: 2024-02-08 | Discharge: 2024-02-08 | Disposition: A | Discharge status: Home / Self Care | Attending: Emergency Medicine | Admitting: Emergency Medicine

## 2024-02-08 DIAGNOSIS — F1193 Opioid use, unspecified with withdrawal: Secondary | ICD-10-CM | POA: Diagnosis not present

## 2024-02-08 DIAGNOSIS — F1123 Opioid dependence with withdrawal: Secondary | ICD-10-CM | POA: Diagnosis not present

## 2024-02-08 DIAGNOSIS — Z79899 Other long term (current) drug therapy: Secondary | ICD-10-CM | POA: Insufficient documentation

## 2024-02-08 LAB — CBC
HCT: 46 % (ref 36.0–46.0)
Hemoglobin: 14.5 g/dL (ref 12.0–15.0)
MCH: 30.7 pg (ref 26.0–34.0)
MCHC: 31.5 g/dL (ref 30.0–36.0)
MCV: 97.3 fL (ref 80.0–100.0)
Platelets: 163 10*3/uL (ref 150–400)
RBC: 4.73 MIL/uL (ref 3.87–5.11)
RDW: 12.7 % (ref 11.5–15.5)
WBC: 10.5 10*3/uL (ref 4.0–10.5)
nRBC: 0 % (ref 0.0–0.2)

## 2024-02-08 LAB — COMPREHENSIVE METABOLIC PANEL
ALT: 13 U/L (ref 0–44)
AST: 15 U/L (ref 15–41)
Albumin: 3.7 g/dL (ref 3.5–5.0)
Alkaline Phosphatase: 33 U/L — ABNORMAL LOW (ref 38–126)
Anion gap: 10 (ref 5–15)
BUN: 29 mg/dL — ABNORMAL HIGH (ref 8–23)
CO2: 28 mmol/L (ref 22–32)
Calcium: 9.7 mg/dL (ref 8.9–10.3)
Chloride: 98 mmol/L (ref 98–111)
Creatinine, Ser: 0.98 mg/dL (ref 0.44–1.00)
GFR, Estimated: 56 mL/min — ABNORMAL LOW (ref >60)
Glucose, Bld: 115 mg/dL — ABNORMAL HIGH (ref 70–99)
Potassium: 3.1 mmol/L — ABNORMAL LOW (ref 3.5–5.1)
Sodium: 136 mmol/L (ref 135–145)
Total Bilirubin: 0.9 mg/dL (ref 0.0–1.2)
Total Protein: 6.5 g/dL (ref 6.5–8.1)

## 2024-02-08 LAB — ETHANOL: Alcohol, Ethyl (B): <10 mg/dL (ref <10)

## 2024-02-08 LAB — ACETAMINOPHEN LEVEL: Acetaminophen (Tylenol), Serum: <10 ug/mL — ABNORMAL LOW (ref 10–30)

## 2024-02-08 LAB — SALICYLATE LEVEL: Salicylate Lvl: <7 mg/dL — ABNORMAL LOW (ref 7.0–30.0)

## 2024-02-08 MED ORDER — CLONIDINE HCL 0.1 MG PO TABS: 0.1000 mg | ORAL_TABLET | Freq: Four times a day (QID) | ORAL | 0 refills | Status: DC | PRN

## 2024-02-08 MED ORDER — CLONIDINE HCL 0.1 MG PO TABS
0.1000 mg | ORAL_TABLET | Freq: Once | ORAL | Status: AC
  Administered 2024-02-08: 0.1 mg via ORAL
  Filled 2024-02-08: qty 1

## 2024-02-08 NOTE — Discharge Instructions (Addendum)
 Please be sure to follow-up at a rehabilitation facility as soon as possible.  When discussing your mother's history, please be sure to let the intake staff know the patient was seen, evaluated in the emergency department and there is no evidence for complicated withdrawal currently.

## 2024-02-08 NOTE — ED Triage Notes (Signed)
 Pt arrives with daughter seeking relief from her opioid withdrawal symptoms. Pt admits to abusing her narcotic prescriptions and having to seek medications from close friends and relatives. She states that she usually gets Norco 5 mg from others. Seeking detox and states she had similar incident pre-covid that required inpatient rehab.

## 2024-02-08 NOTE — ED Provider Notes (Signed)
 [facility] EMERGENCY DEPARTMENT AT [facility] Provider Note   CSN: [id number] Arrival date & time: 02/08/24  1702     History  Chief Complaint  Patient presents with   Drug Problem    [name] is a 89 y.o. female.  HPI Patient presents with her daughter who provides much of the history.  Patient acknowledges the presentation, however. Patient also has very poor hearing which complicates her ability to interact. In essence the patient has long-term opiate addiction, and is recently had some withdrawal symptoms restlessness, shakiness, and wants help.  No focal pain, no syncope, no fever, no report of other complications. Daughter notes the patient was in rehabilitation, inpatient about 5 years ago.    Home Medications Prior to Admission medications   Medication Sig Start Date End Date Taking? Authorizing Provider  albuterol (PROVENTIL) (2.5 MG/3ML) 0.083% nebulizer solution INHALE CONTENTS OF 1 VIAL PER NEBULIZER EVERY 6 HOURS AS NEEDED FOR WHEEZING OR SHORTNESS OF BREATH 09/17/23   Etta Grandchild, MD  carvedilol (COREG) 12.5 MG tablet Take 1 tablet (12.5 mg total) by mouth 2 (two) times daily with a meal. 08/19/23   Jodelle Red, MD  Cholecalciferol 2000 units TABS Take 2 tablets (4,000 Units total) by mouth daily. 03/31/18   Etta Grandchild, MD  clonazePAM (KLONOPIN) 1 MG tablet TAKE 1 TABLET(1 MG) BY MOUTH TWICE DAILY AS NEEDED FOR ANXIETY 11/27/23   Etta Grandchild, MD  cloNIDine (CATAPRES) 0.1 MG tablet Take 1 tablet (0.1 mg total) by mouth every 6 (six) hours as needed (withdrawal symptoms). 02/08/24  Yes Gerhard Munch, MD  hydrALAZINE (APRESOLINE) 25 MG tablet Take 1 tablet (25 mg total) by mouth 2 (two) times daily. 08/19/23 01/28/24  Jodelle Red, MD  HYDROcodone-acetaminophen (NORCO) 10-325 MG tablet Take 1 tablet by mouth every 6 (six) hours as needed. 01/29/24 02/28/24  Etta Grandchild, MD  omeprazole (PRILOSEC) 40 MG capsule TAKE 1 CAPSULE(40  MG) BY MOUTH DAILY 08/27/23   Etta Grandchild, MD  oxybutynin (DITROPAN-XL) 10 MG 24 hr tablet Take 10 mg by mouth daily. 07/14/23   [provider]  promethazine (PHENERGAN) 12.5 MG tablet TAKE 1 TABLET(12.5 MG) BY MOUTH EVERY 8 HOURS AS NEEDED FOR NAUSEA OR VOMITING 10/25/23   Etta Grandchild, MD  rivaroxaban (XARELTO) 20 MG TABS tablet Take 1 tablet (20 mg total) by mouth daily with supper. 01/19/24   Jodelle Red, MD  spironolactone (ALDACTONE) 25 MG tablet Take 1 tablet (25 mg total) by mouth daily. 08/19/23   Jodelle Red, MD  torsemide (DEMADEX) 20 MG tablet TAKE 1 TABLET BY MOUTH EVERY DAY 10/25/23   Etta Grandchild, MD  traZODone (DESYREL) 100 MG tablet TAKE 1 TABLET(100 MG) BY MOUTH AT BEDTIME 10/04/23   Etta Grandchild, MD      Allergies    Amlodipine, Enalapril, Losartan potassium, Metformin, and Morphine and codeine    Review of Systems   Review of Systems  Physical Exam Updated Vital Signs BP (!) 123/95 (BP Location: Left Arm)   Pulse 89   Temp 99 F (37.2 C) (Oral)   Resp (!) 25   SpO2 98%  Physical Exam Vitals and nursing note reviewed.  Constitutional:      General: She is not in acute distress.    Appearance: She is well-developed.  HENT:     Head: Normocephalic and atraumatic.  Eyes:     Conjunctiva/sclera: Conjunctivae normal.  Cardiovascular:     Rate and  Rhythm: Normal rate and regular rhythm.  Pulmonary:     Effort: Pulmonary effort is normal. No respiratory distress.     Breath sounds: No stridor.  Abdominal:     General: There is no distension.  Skin:    General: Skin is warm and dry.  Neurological:     Mental Status: She is alert and oriented to person, place, and time.     Cranial Nerves: No cranial nerve deficit.  Psychiatric:        Mood and Affect: Mood normal.     ED Results / Procedures / Treatments   Labs (all labs ordered are listed, but only abnormal results are displayed) Labs Reviewed  COMPREHENSIVE  METABOLIC PANEL - Abnormal; Notable for the following components:      Result Value   Potassium 3.1 (*)    Glucose, Bld 115 (*)    BUN 29 (*)    Alkaline Phosphatase 33 (*)    GFR, Estimated 56 (*)    All other components within normal limits  SALICYLATE LEVEL - Abnormal; Notable for the following components:   Salicylate Lvl <7.0 (*)    All other components within normal limits  ACETAMINOPHEN LEVEL - Abnormal; Notable for the following components:   Acetaminophen (Tylenol), Serum <10 (*)    All other components within normal limits  CBC  ETHANOL  RAPID URINE DRUG SCREEN, HOSP PERFORMED    EKG None  Radiology No results found.  Procedures Procedures    Medications Ordered in ED Medications  cloNIDine (CATAPRES) tablet 0.1 mg (0.1 mg Oral Given 02/08/24 1852)    ED Course/ Medical Decision Making/ A&P                                 Medical Decision Making Adult female presents with request for assistance with withdrawal.  No evidence for complicated withdrawal, no hemodynamic instability, no distress.  Patient last used earlier today, and was started on clonidine protocol for withdrawal symptom management.  After lengthy conversation with the patient, family, there is no availability for direct transfer to rehabilitation from this facility and the patient will be discharged with resources to proceed to those tomorrow.  Family comfortable with this plan.  Send evidence for decompensation, complicated withdrawal, no indication for admission.  Amount and/or Complexity of Data Reviewed Independent Historian:     Details: Daughter at bedside External Data Reviewed: notes.    Details: Prior hospitalization notes and I have seen the patient in the distant past for weakness.  Risk Prescription drug management. Decision regarding hospitalization. Diagnosis or treatment significantly limited by social determinants of health.  Final Clinical Impression(s) / ED Diagnoses Final  diagnoses:  Opiate withdrawal (HCC)    Rx / DC Orders ED Discharge Orders          Ordered    cloNIDine (CATAPRES) 0.1 MG tablet  Every 6 hours PRN        02/08/24 1908              Gerhard Munch, MD 02/08/24 1910

## 2024-02-09 ENCOUNTER — Ambulatory Visit (INDEPENDENT_AMBULATORY_CARE_PROVIDER_SITE_OTHER): Payer: Medicare HMO

## 2024-02-09 VITALS — Ht 63.0 in | Wt 188.0 lb

## 2024-02-09 DIAGNOSIS — Z0000 Encounter for general adult medical examination without abnormal findings: Secondary | ICD-10-CM | POA: Diagnosis not present

## 2024-02-09 NOTE — Patient Instructions (Addendum)
 [name] , Thank you for taking time to come for your Medicare Wellness Visit. I appreciate your ongoing commitment to your health goals. Please review the following plan we discussed and let me know if I can assist you in the future.   Referrals/Orders/Follow-Ups/Clinician Recommendations: Aim for 30 minutes of exercise or brisk walking, 6-8 glasses of water, and 5 servings of fruits and vegetables each day. Educated and advised on the Shingles and COVID vaccines.  This is a list of the screening recommended for you and due dates:  Health Maintenance  Topic Date Due   Zoster (Shingles) Vaccine (1 of 2) 02/15/1985   COVID-19 Vaccine (4 - 2024-25 season) 07/27/2023   DTaP/Tdap/Td vaccine (3 - Td or Tdap) 04/10/2030   Pneumonia Vaccine  Completed   Flu Shot  Completed   DEXA scan (bone density measurement)  Completed   HPV Vaccine  Aged Out    Advanced directives: (Provided) Advance directive discussed with you today. I have provided a copy for you to complete at home and have notarized. Once this is complete, please bring a copy in to our office so we can scan it into your chart.   Next Medicare Annual Wellness Visit scheduled for next year: Yes  Managing Pain Without Opioids Opioids are strong medicines used to treat moderate to severe pain. For some people, especially those who have long-term (chronic) pain, opioids may not be the best choice for pain management due to: Side effects like nausea, constipation, and sleepiness. The risk of addiction (opioid use disorder). The longer you take opioids, the greater your risk of addiction. Pain that lasts for more than 3 months is called chronic pain. Managing chronic pain usually requires more than one approach and is often provided by a team of health care providers working together (multidisciplinary approach). Pain management may be done at a pain management center or pain clinic. How to manage pain without the use of opioids Use  non-opioid medicines Non-opioid medicines for pain may include: Over-the-counter or prescription non-steroidal anti-inflammatory drugs (NSAIDs). These may be the first medicines used for pain. They work well for muscle and bone pain, and they reduce swelling. Acetaminophen. This over-the-counter medicine may work well for milder pain but not swelling. Antidepressants. These may be used to treat chronic pain. A certain type of antidepressant (tricyclics) is often used. These medicines are given in lower doses for pain than when used for depression. Anticonvulsants. These are usually used to treat seizures but may also reduce nerve (neuropathic) pain. Muscle relaxants. These relieve pain caused by sudden muscle tightening (spasms). You may also use a pain medicine that is applied to the skin as a patch, cream, or gel (topical analgesic), such as a numbing medicine. These may cause fewer side effects than medicines taken by mouth. Do certain therapies as directed Some therapies can help with pain management. They include: Physical therapy. You will do exercises to gain strength and flexibility. A physical therapist may teach you exercises to move and stretch parts of your body that are weak, stiff, or painful. You can learn these exercises at physical therapy visits and practice them at home. Physical therapy may also involve: Massage. Heat wraps or applying heat or cold to affected areas. Electrical signals that interrupt pain signals (transcutaneous electrical nerve stimulation, TENS). Weak lasers that reduce pain and swelling (low-level laser therapy). Signals from your body that help you learn to regulate pain (biofeedback). Occupational therapy. This helps you to learn ways to function at  home and work with less pain. Recreational therapy. This involves trying new activities or hobbies, such as a physical activity or drawing. Mental health therapy, including: Cognitive behavioral therapy (CBT).  This helps you learn coping skills for dealing with pain. Acceptance and commitment therapy (ACT) to change the way you think and react to pain. Relaxation therapies, including muscle relaxation exercises and mindfulness-based stress reduction. Pain management counseling. This may be individual, family, or group counseling.  Receive medical treatments Medical treatments for pain management include: Nerve block injections. These may include a pain blocker and anti-inflammatory medicines. You may have injections: Near the spine to relieve chronic back or neck pain. Into joints to relieve back or joint pain. Into nerve areas that supply a painful area to relieve body pain. Into muscles (trigger point injections) to relieve some painful muscle conditions. A medical device placed near your spine to help block pain signals and relieve nerve pain or chronic back pain (spinal cord stimulation device). Acupuncture. Follow these instructions at home Medicines Take over-the-counter and prescription medicines only as told by your health care provider. If you are taking pain medicine, ask your health care providers about possible side effects to watch out for. Do not drive or use heavy machinery while taking prescription opioid pain medicine. Lifestyle  Do not use drugs or alcohol to reduce pain. If you drink alcohol, limit how much you have to: 0-1 drink a day for women who are not pregnant. 0-2 drinks a day for men. Know how much alcohol is in a drink. In the [place] one drink equals one 12 oz bottle of beer (355 mL), one 5 oz glass of wine (148 mL), or one 1 oz glass of hard liquor (44 mL). Do not use any products that contain nicotine or tobacco. These products include cigarettes, chewing tobacco, and vaping devices, such as e-cigarettes. If you need help quitting, ask your health care provider. Eat a healthy diet and maintain a healthy weight. Poor diet and excess weight may make pain worse. Eat  foods that are high in fiber. These include fresh fruits and vegetables, whole grains, and beans. Limit foods that are high in fat and processed sugars, such as fried and sweet foods. Exercise regularly. Exercise lowers stress and may help relieve pain. Ask your health care provider what activities and exercises are safe for you. If your health care provider approves, join an exercise class that combines movement and stress reduction. Examples include yoga and tai chi. Get enough sleep. Lack of sleep may make pain worse. Lower stress as much as possible. Practice stress reduction techniques as told by your therapist. General instructions Work with all your pain management providers to find the treatments that work best for you. You are an important member of your pain management team. There are many things you can do to reduce pain on your own. Consider joining an online or in-person support group for people who have chronic pain. Keep all follow-up visits. This is important. Where to find more information You can find more information about managing pain without opioids from: American Academy of Pain Medicine: painmed.org Institute for Chronic Pain: instituteforchronicpain.org American Chronic Pain Association: theacpa.org Contact a health care provider if: You have side effects from pain medicine. Your pain gets worse or does not get better with treatments or home therapy. You are struggling with anxiety or depression. Summary Many types of pain can be managed without opioids. Chronic pain may respond better to pain management without opioids. Pain  is best managed when you and a team of health care providers work together. Pain management without opioids may include non-opioid medicines, medical treatments, physical therapy, mental health therapy, and lifestyle changes. Tell your health care providers if your pain gets worse or is not being managed well enough. This information is not  intended to replace advice given to you by your health care provider. Make sure you discuss any questions you have with your health care provider. Document Revised: 02/21/2021 Document Reviewed: 02/21/2021 Elsevier Patient Education  2024 [employer].

## 2024-02-09 NOTE — Progress Notes (Addendum)
 Subjective:   [name] is a 89 y.o. who presents for a Medicare Wellness preventive visit.  Visit Complete: Virtual I connected with  [name] on 02/09/24 by a audio enabled telemedicine application and verified that I am speaking with the correct person using two identifiers.  Patient Location: Home  Provider Location: Office/Clinic  I discussed the limitations of evaluation and management by telemedicine. The patient expressed understanding and agreed to proceed.  Vital Signs: Because this visit was a virtual/telehealth visit, some criteria may be missing or patient reported. Any vitals not documented were not able to be obtained and vitals that have been documented are patient reported.  VideoDeclined- This patient declined Librarian, academic. Therefore the visit was completed with audio only.  Persons Participating in Visit: Patient.  AWV Questionnaire: No: Patient Medicare AWV questionnaire was not completed prior to this visit.  Cardiac Risk Factors include: advanced age (>17men, >47 women);hypertension;dyslipidemia;obesity (BMI >30kg/m2)     Objective:    Today's Vitals   02/09/24 1305  Weight: 188 lb (85.3 kg)  Height: 5\' 3"  (1.6 m)   Body mass index is 33.3 kg/m.     02/09/2024    1:03 PM 02/08/2024    5:15 PM 02/02/2024    5:06 PM 11/24/2023   12:36 PM 02/07/2023   10:34 AM 02/05/2023    1:07 PM 02/01/2022   11:08 AM  Advanced Directives  Does Patient Have a Medical Advance Directive? No No No Yes No Yes Yes  Type of Ambulance person of [place];Living will Healthcare Power of [place];Living will  Copy of Healthcare Power of Attorney in Chart?     No - copy requested No - copy requested No - copy requested  Would patient like information on creating a medical advance directive? Yes (MAU/Ambulatory/Procedural Areas - Information given)    No - Guardian declined      Current  Medications (verified) Outpatient Encounter Medications as of 02/09/2024  Medication Sig   albuterol (PROVENTIL) (2.5 MG/3ML) 0.083% nebulizer solution INHALE CONTENTS OF 1 VIAL PER NEBULIZER EVERY 6 HOURS AS NEEDED FOR WHEEZING OR SHORTNESS OF BREATH   carvedilol (COREG) 12.5 MG tablet Take 1 tablet (12.5 mg total) by mouth 2 (two) times daily with a meal.   Cholecalciferol 2000 units TABS Take 2 tablets (4,000 Units total) by mouth daily.   clonazePAM (KLONOPIN) 1 MG tablet TAKE 1 TABLET(1 MG) BY MOUTH TWICE DAILY AS NEEDED FOR ANXIETY   cloNIDine (CATAPRES) 0.1 MG tablet Take 1 tablet (0.1 mg total) by mouth every 6 (six) hours as needed (withdrawal symptoms).   HYDROcodone-acetaminophen (NORCO) 10-325 MG tablet Take 1 tablet by mouth every 6 (six) hours as needed.   omeprazole (PRILOSEC) 40 MG capsule TAKE 1 CAPSULE(40 MG) BY MOUTH DAILY   oxybutynin (DITROPAN-XL) 10 MG 24 hr tablet Take 10 mg by mouth daily.   promethazine (PHENERGAN) 12.5 MG tablet TAKE 1 TABLET(12.5 MG) BY MOUTH EVERY 8 HOURS AS NEEDED FOR NAUSEA OR VOMITING   rivaroxaban (XARELTO) 20 MG TABS tablet Take 1 tablet (20 mg total) by mouth daily with supper.   spironolactone (ALDACTONE) 25 MG tablet Take 1 tablet (25 mg total) by mouth daily.   torsemide (DEMADEX) 20 MG tablet TAKE 1 TABLET BY MOUTH EVERY DAY   traZODone (DESYREL) 100 MG tablet TAKE 1 TABLET(100 MG) BY MOUTH AT BEDTIME   hydrALAZINE (APRESOLINE) 25 MG tablet Take 1 tablet (  25 mg total) by mouth 2 (two) times daily.   No facility-administered encounter medications on file as of 02/09/2024.    Allergies (verified) Amlodipine, Enalapril, Losartan potassium, Metformin, and Morphine and codeine   History: Past Medical History:  Diagnosis Date   Abdominal pain    Anemia    NOS   Anxiety    Back pain    Bruises easily    Chronic diastolic CHF (congestive heart failure) (HCC)    a. 06/2013 EF 65-70%.   Chronic headaches    HISTORY OF    Depression     Diabetes mellitus    type II  DIET CONTROLLED   Diarrhea    Hepatitis    AGE 30S   Hyperlipidemia    Hypertension    Mild aortic stenosis    a. 06/2013 Echo: EF 65-70%, mod LVH with focal basal hypertrophy, very mild AS.   Osteoarthritis    Oxygen desaturation during sleep    USES 2 LITERS BEDTIME   VIA CPAP 06/2012 [facility]    Shortness of breath    WITH EXERTION USES 2 L O2 BEDTIME   Sleep apnea    CPAP WITH O2 2 LITERS 2013 ([facility])   Wears dentures    Wears glasses    Past Surgical History:  Procedure Laterality Date   ABDOMINAL HYSTERECTOMY     APPENDECTOMY     CHOLECYSTECTOMY     IR KYPHO THORACIC WITH BONE BIOPSY  06/16/2023   JOINT REPLACEMENT     LEFT KNEE    KNEE ARTHROPLASTY Right 03/22/2015   Procedure: COMPUTER ASSISTED TOTAL KNEE ARTHROPLASTY;  Surgeon: Eldred Manges, MD;  Location: [facility] OR;  Service: Orthopedics;  Laterality: Right;   KNEE ARTHROSCOPY     RIGHT   LEFT AND RIGHT HEART CATHETERIZATION WITH CORONARY ANGIOGRAM N/A 10/28/2013   Procedure: LEFT AND RIGHT HEART CATHETERIZATION WITH CORONARY ANGIOGRAM;  Surgeon: Micheline Chapman, MD;  Location: [facility] CATH [facility];  Service: Cardiovascular;  Laterality: N/A;   LEFT HEART CATHETERIZATION WITH CORONARY ANGIOGRAM N/A 11/14/2014   Procedure: LEFT HEART CATHETERIZATION WITH CORONARY ANGIOGRAM;  Surgeon: Wendall Stade, MD;  Location: [facility] CATH [facility];  Service: Cardiovascular;  Laterality: N/A;   REVISION TOTAL KNEE ARTHROPLASTY  2011   ROTATOR CUFF REPAIR  2010   SHOULDER ARTHROSCOPY  09/17/2012   Procedure: ARTHROSCOPY SHOULDER;  Surgeon: Kennieth Rad, MD;  Location: [facility] OR;  Service: Orthopedics;  Laterality: Left;   TONSILLECTOMY     TONSILLECTOMY     Family History  Problem Relation Age of Onset   Cancer Father        prostate cancer   Heart disease Sister    Heart disease Brother    Alcohol abuse Other    Hypertension Other    Kidney disease Other    Mental illness Other    Emphysema Brother    Social  History   Socioeconomic History   Marital status: Widowed    Spouse name: Not on file   Number of children: 5   Years of education: Not on file   Highest education level: Not on file  Occupational History   Occupation: retired     Associate Professor: RETIRED    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[employer]  Tobacco Use   Smoking status: Never    Passive exposure: Never   Smokeless tobacco: Never  Vaping Use   Vaping status: Never Used  Substance and Sexual Activity   Alcohol use: No  Drug use: No   Sexual activity: Never  Other Topics Concern   Not on file  Social History Narrative   Widowed; 5 children   Currently on Oxygen (as of 02/09/24) - 2 liters   Social Drivers of Corporate investment banker Strain: Low Risk  (02/09/2024)   Overall Financial Resource Strain (CARDIA)    Difficulty of Paying Living Expenses: Not hard at all  Food Insecurity: No Food Insecurity (02/09/2024)   Hunger Vital Sign    Worried About Running Out of Food in the Last Year: Never true    Ran Out of Food in the Last Year: Never true  Transportation Needs: No Transportation Needs (02/09/2024)   PRAPARE - Administrator, Civil Service (Medical): No    Lack of Transportation (Non-Medical): No  Physical Activity: Insufficiently Active (02/09/2024)   Exercise Vital Sign    Days of Exercise per Week: 5 days    Minutes of Exercise per Session: 20 min  Stress: No Stress Concern Present (02/09/2024)   [employer] of Occupational Health - Occupational Stress Questionnaire    Feeling of Stress : Not at all  Social Connections: Socially Isolated (02/09/2024)   Social Connection and Isolation Panel [NHANES]    Frequency of Communication with Friends and Family: More than three times a week    Frequency of Social Gatherings with Friends and Family: Once a week    Attends Religious Services: Never    Database administrator or Organizations: No    Attends Banker Meetings: Never    Marital Status:  Widowed    Tobacco Counseling Counseling given: No    Clinical Intake:  Pre-visit preparation completed: Yes  Pain : No/denies pain     BMI - recorded: 33.8 Nutritional Status: BMI > 30  Obese Nutritional Risks: None Diabetes: No  How often do you need to have someone help you when you read instructions, pamphlets, or other written materials from your doctor or pharmacy?: 1 - Never  Interpreter Needed?: No  Information entered by :: Hassell Halim, CMA   Activities of Daily Living     02/09/2024    1:11 PM  In your present state of health, do you have any difficulty performing the following activities:  Hearing? 0  Vision? 0  Difficulty concentrating or making decisions? 0  Walking or climbing stairs? 0  Dressing or bathing? 0  Doing errands, shopping? 0  Preparing Food and eating ? N  Using the Toilet? N  In the past six months, have you accidently leaked urine? Y  Comment wears depends/pads  Do you have problems with loss of bowel control? N  Managing your Medications? N  Managing your Finances? N  Housekeeping or managing your Housekeeping? N    Patient Care Team: Etta Grandchild, MD as PCP - General Jodelle Red, MD as PCP - Cardiology (Cardiology) Jodelle Red, MD as Consulting Physician (Cardiology) Dimitri Ped, MD as Consulting Physician (Ophthalmology)  Indicate any recent Medical Services you may have received from other than [facility] providers in the past year (date may be approximate).     Assessment:   This is a routine wellness examination for Latasha.  Hearing/Vision screen Hearing Screening - Comments:: Wears bilateral hearing aids Vision Screening - Comments:: Wears rx glasses - up to date with routine eye exams with Dr Wynell Balloon   Goals Addressed               This Visit's  Progress     Patient Stated (pt-stated)        Patient stated she plans to continue to stay active.       Depression  Screen     02/09/2024    1:15 PM 07/17/2023   10:44 AM 02/05/2023    1:18 PM 09/04/2022   11:43 AM 02/01/2022   11:08 AM 02/01/2022   11:06 AM 01/16/2022   10:30 AM  PHQ 2/9 Scores  PHQ - 2 Score 0 0 2 2 0 0 0  PHQ- 9 Score 0  2 3       Fall Risk     02/09/2024    1:12 PM 07/17/2023   10:43 AM 02/05/2023    1:08 PM 09/04/2022   11:43 AM 07/01/2022   10:05 AM  Fall Risk   Falls in the past year? 0 0 0 0 0  Comment     continues to deny falls x last 12 months; uses walker "all the time"  Number falls in past yr: 0 0 0 0 0  Injury with Fall? 0 0 0 0 0  Comment     N/A- no falls reported  Risk for fall due to : No Fall Risks No Fall Risks No Fall Risks History of fall(s) Impaired mobility;Medication side effect  Risk for fall due to: Comment    walker   Follow up Falls prevention discussed;Falls evaluation completed Falls evaluation completed Falls prevention discussed Falls evaluation completed Falls prevention discussed    MEDICARE RISK AT HOME:  Medicare Risk at Home Any stairs in or around the home?: Yes (outside) If so, are there any without handrails?: No Home free of loose throw rugs in walkways, pet beds, electrical cords, etc?: Yes Adequate lighting in your home to reduce risk of falls?: Yes Life alert?: No Use of a cane, walker or w/c?: Yes (cane/walker) Grab bars in the bathroom?: Yes Shower chair or bench in shower?: Yes Elevated toilet seat or a handicapped toilet?: Yes (handicapped toilet)  TIMED UP AND GO:  Was the test performed?  No  Cognitive Function: 6CIT completed        02/09/2024    1:13 PM 02/05/2023    1:12 PM 08/19/2019    3:49 PM  6CIT Screen  What Year? 0 points 0 points 0 points  What month? 0 points 0 points 0 points  What time? 0 points 0 points 0 points  Count back from 20 0 points 2 points 2 points  Months in reverse 4 points 2 points 4 points  Repeat phrase 2 points 0 points 2 points  Total Score 6 points 4 points 8 points     Immunizations Immunization History  Administered Date(s) Administered   Fluad Quad(high Dose 65+) 08/19/2019, 09/04/2022   Fluad Trivalent(High Dose 65+) 10/20/2023   Influenza Split 08/20/2012   Influenza, High Dose Seasonal PF 08/02/2015, 09/10/2016, 09/17/2017, 09/15/2018   Influenza,inj,Quad PF,6+ Mos 10/28/2013, 11/28/2014   Influenza-Unspecified 08/16/2020   PFIZER Comirnaty(Gray Top)Covid-19 Tri-Sucrose Vaccine 01/02/2021   PFIZER(Purple Top)SARS-COV-2 Vaccination 12/30/2019, 01/20/2020   Pneumococcal Conjugate-13 05/25/2014   Pneumococcal Polysaccharide-23 04/16/2012, 09/15/2018   Td 04/25/2010   Tdap 04/10/2020   Zoster, Live 11/28/2014    Screening Tests Health Maintenance  Topic Date Due   Zoster Vaccines- Shingrix (1 of 2) 02/15/1985   COVID-19 Vaccine (4 - 2024-25 season) 07/27/2023   DTaP/Tdap/Td (3 - Td or Tdap) 04/10/2030   Pneumonia Vaccine 61+ Years old  Completed   INFLUENZA VACCINE  Completed   DEXA SCAN  Completed   HPV VACCINES  Aged Out    Health Maintenance  Health Maintenance Due  Topic Date Due   Zoster Vaccines- Shingrix (1 of 2) 02/15/1985   COVID-19 Vaccine (4 - 2024-25 season) 07/27/2023   Health Maintenance Items Addressed: 02/09/2024.  Pt declines the Shingles and COVID vaccines.   Additional Screening:  Vision Screening: Recommended annual ophthalmology exams for early detection of glaucoma and other disorders of the eye.  Dental Screening: Recommended annual dental exams for proper oral hygiene  Community Resource Referral / Chronic Care Management: CRR required this visit?  No   CCM required this visit?  No     Plan:     I have personally reviewed and noted the following in the patient's chart:   Medical and social history Use of alcohol, tobacco or illicit drugs  Current medications and supplements including opioid prescriptions. Patient is currently taking opioid prescriptions. Information provided to patient  regarding non-opioid alternatives. Patient advised to discuss non-opioid treatment plan with their provider. Functional ability and status Nutritional status Physical activity Advanced directives List of other physicians Hospitalizations, surgeries, and ER visits in previous 12 months Vitals Screenings to include cognitive, depression, and falls Referrals and appointments  In addition, I have reviewed and discussed with patient certain preventive protocols, quality metrics, and best practice recommendations. A written personalized care plan for preventive services as well as general preventive health recommendations were provided to patient.     Darreld Mclean, CMA   02/09/2024   After Visit Summary: (MyChart) Due to this being a telephonic visit, the after visit summary with patients personalized plan was offered to patient via MyChart   Notes: Nothing significant to report at this time.

## 2024-02-10 ENCOUNTER — Telehealth: Payer: Self-pay

## 2024-02-10 NOTE — Transitions of Care (Post Inpatient/ED Visit) (Signed)
 02/10/2024  Name: [name] MRN: [id number] DOB: 05/10/1933  Today's TOC FU Call Status: Today's TOC FU Call Status:: Successful TOC FU Call Completed TOC FU Call Complete Date: 02/10/24 Patient's Name and Date of Birth confirmed.  Transition Care Management Follow-up Telephone Call Date of Discharge: 02/08/24 Discharge Facility: [facility]) Type of Discharge: Emergency Department Reason for ED Visit: Other: (opioid use) How have you been since you were released from the hospital?: Better Any questions or concerns?: No  Items Reviewed: Did you receive and understand the discharge instructions provided?: Yes Medications obtained,verified, and reconciled?: Yes (Medications Reviewed) Any new allergies since your discharge?: No Dietary orders reviewed?: Yes Do you have support at home?: No  Medications Reviewed Today: Medications Reviewed Today     Reviewed by Karena Addison, LPN (Licensed Practical Nurse) on 02/10/24 at 1229  Med List Status: <None>   Medication Order Taking? Sig Documenting Provider Last Dose Status Informant  albuterol (PROVENTIL) (2.5 MG/3ML) 0.083% nebulizer solution [id number] No INHALE CONTENTS OF 1 VIAL PER NEBULIZER EVERY 6 HOURS AS NEEDED FOR WHEEZING OR SHORTNESS OF Sheryle Hail, MD Taking Active   carvedilol (COREG) 12.5 MG tablet [id number] No Take 1 tablet (12.5 mg total) by mouth 2 (two) times daily with a meal. Jodelle Red, MD Taking Active   Cholecalciferol 2000 units TABS [id number] No Take 2 tablets (4,000 Units total) by mouth daily. Etta Grandchild, MD Taking Active Self, Pharmacy Records  clonazePAM [facility]) 1 MG tablet [id number] No TAKE 1 TABLET(1 MG) BY MOUTH TWICE DAILY AS NEEDED FOR ANXIETY Etta Grandchild, MD Taking Active   cloNIDine (CATAPRES) 0.1 MG tablet [id number] No Take 1 tablet (0.1 mg total) by mouth every 6 (six) hours as needed (withdrawal symptoms). Gerhard Munch, MD Taking Active   hydrALAZINE  (APRESOLINE) 25 MG tablet [id number] No Take 1 tablet (25 mg total) by mouth 2 (two) times daily. Jodelle Red, MD Taking Expired 01/28/24 2359   HYDROcodone-acetaminophen (NORCO) 10-325 MG tablet [id number] No Take 1 tablet by mouth every 6 (six) hours as needed. Etta Grandchild, MD Taking Active   omeprazole (PRILOSEC) 40 MG capsule [id number] No TAKE 1 CAPSULE(40 MG) BY MOUTH DAILY Etta Grandchild, MD Taking Active   oxybutynin (DITROPAN-XL) 10 MG 24 hr tablet [id number] No Take 10 mg by mouth daily. [provider] Taking Active            Med Note Henreitta Leber, JACQUELINE L   Tue Aug 19, 2023  1:19 PM)    promethazine (PHENERGAN) 12.5 MG tablet [id number] No TAKE 1 TABLET(12.5 MG) BY MOUTH EVERY 8 HOURS AS NEEDED FOR NAUSEA OR VOMITING Etta Grandchild, MD Taking Active   rivaroxaban (XARELTO) 20 MG TABS tablet [id number] No Take 1 tablet (20 mg total) by mouth daily with supper. Jodelle Red, MD Taking Active   spironolactone (ALDACTONE) 25 MG tablet [id number] No Take 1 tablet (25 mg total) by mouth daily. Jodelle Red, MD Taking Active   torsemide (DEMADEX) 20 MG tablet [id number] No TAKE 1 TABLET BY MOUTH EVERY DAY Etta Grandchild, MD Taking Active   traZODone (DESYREL) 100 MG tablet [id number] No TAKE 1 TABLET(100 MG) BY MOUTH AT BEDTIME Etta Grandchild, MD Taking Active             Home Care and Equipment/Supplies: Were Home Health Services Ordered?: NA Any new equipment or medical supplies ordered?: NA  Functional Questionnaire: Do you need assistance with bathing/showering or dressing?: No Do  you need assistance with meal preparation?: No Do you need assistance with eating?: No Do you have difficulty maintaining continence: No Do you need assistance with getting out of bed/getting out of a chair/moving?: No Do you have difficulty managing or taking your medications?: No  Follow up appointments reviewed: PCP Follow-up appointment confirmed?: No  (declined appt) MD Provider Line [phone] Given: No Specialist Hospital Follow-up appointment confirmed?: NA Do you need transportation to your follow-up appointment?: No Do you understand care options if your condition(s) worsen?: Yes-patient verbalized understanding    SIGNATURE Karena Addison, LPN [facility] Nurse Health Advisor Direct Dial [phone]

## 2024-02-18 ENCOUNTER — Ambulatory Visit (HOSPITAL_BASED_OUTPATIENT_CLINIC_OR_DEPARTMENT_OTHER): Payer: Medicare HMO | Admitting: Cardiology

## 2024-02-23 ENCOUNTER — Other Ambulatory Visit: Payer: Self-pay | Admitting: Family Medicine

## 2024-02-23 DIAGNOSIS — M8080XS Other osteoporosis with current pathological fracture, unspecified site, sequela: Secondary | ICD-10-CM

## 2024-02-25 NOTE — Telephone Encounter (Signed)
 Trouble swallowing Fosamax

## 2024-03-02 ENCOUNTER — Other Ambulatory Visit: Payer: Self-pay | Admitting: Internal Medicine

## 2024-03-02 MED ORDER — HYDROCODONE-ACETAMINOPHEN 10-325 MG PO TABS: 1.0000 | ORAL_TABLET | Freq: Three times a day (TID) | ORAL | 0 refills | Status: DC | PRN

## 2024-03-02 NOTE — Telephone Encounter (Signed)
 Copied from CRM [id number]. Topic: Clinical - Medication Refill >> Mar 02, 2024  8:06 AM Truddie Crumble wrote: Most Recent Primary Care Visit:  Provider: Darreld Mclean  Department: [facility] [place]  Visit Type: MEDICARE AWV, SEQUENTIAL  Date: 02/09/2024  Medication: hydrocodone  Has the patient contacted their pharmacy? No (Agent: If no, request that the patient contact the pharmacy for the refill. If patient does not wish to contact the pharmacy document the reason why and proceed with request.) (Agent: If yes, when and what did the pharmacy advise?)  Is this the correct pharmacy for this prescription? Yes If no, delete pharmacy and type the correct one.  This is the patient's preferred pharmacy:  [facility] [address], [place] - [address] [place] [address] Phone: [phone] Fax: [phone]  Has the prescription been filled recently? Yes  Is the patient out of the medication? Yes  Has the patient been seen for an appointment in the last year OR does the patient have an upcoming appointment? Yes  Can we respond through MyChart? Yes  Agent: Please be advised that Rx refills may take up to 3 business days. We ask that you follow-up with your pharmacy.

## 2024-03-04 ENCOUNTER — Telehealth: Payer: Self-pay | Admitting: Internal Medicine

## 2024-03-04 ENCOUNTER — Encounter: Payer: Self-pay | Admitting: Internal Medicine

## 2024-03-04 ENCOUNTER — Ambulatory Visit: Admitting: Internal Medicine

## 2024-03-04 VITALS — BP 152/94 | HR 83 | Temp 98.7°F | Resp 16 | Ht 63.0 in | Wt 187.8 lb

## 2024-03-04 DIAGNOSIS — I5032 Chronic diastolic (congestive) heart failure: Secondary | ICD-10-CM

## 2024-03-04 DIAGNOSIS — M150 Primary generalized (osteo)arthritis: Secondary | ICD-10-CM | POA: Diagnosis not present

## 2024-03-04 DIAGNOSIS — G8929 Other chronic pain: Secondary | ICD-10-CM | POA: Diagnosis not present

## 2024-03-04 DIAGNOSIS — M545 Low back pain, unspecified: Secondary | ICD-10-CM | POA: Diagnosis not present

## 2024-03-04 DIAGNOSIS — I10 Essential (primary) hypertension: Secondary | ICD-10-CM | POA: Diagnosis not present

## 2024-03-04 MED ORDER — HYDROCODONE-ACETAMINOPHEN 10-325 MG PO TABS: 1.0000 | ORAL_TABLET | Freq: Three times a day (TID) | ORAL | 0 refills | Status: AC | PRN

## 2024-03-04 NOTE — Telephone Encounter (Unsigned)
 Copied from CRM [id number]. Topic: Clinical - Prescription Issue >> Mar 04, 2024  8:03 AM Alcus Dad wrote: Reason for CRM: Patient went to pharmacy and they stated the 15 tablets that Dr. Lenna Gilford couldn't be filled because of the way the Dr. Worded it. Patient is in a lot of pain and need more tablets than 15. HYDROcodone-acetaminophen (NORCO) 10-325 MG tablet. Please call [phone]

## 2024-03-04 NOTE — Patient Instructions (Signed)
 Hypertension, Adult High blood pressure (hypertension) is when the force of blood pumping through the arteries is too strong. The arteries are the blood vessels that carry blood from the heart throughout the body. Hypertension forces the heart to work harder to pump blood and may cause arteries to become narrow or stiff. Untreated or uncontrolled hypertension can lead to a heart attack, heart failure, a stroke, kidney disease, and other problems. A blood pressure reading consists of a higher number over a lower number. Ideally, your blood pressure should be below 120/80. The first ("top") number is called the systolic pressure. It is a measure of the pressure in your arteries as your heart beats. The second ("bottom") number is called the diastolic pressure. It is a measure of the pressure in your arteries as the heart relaxes. What are the causes? The exact cause of this condition is not known. There are some conditions that result in high blood pressure. What increases the risk? Certain factors may make you more likely to develop high blood pressure. Some of these risk factors are under your control, including: Smoking. Not getting enough exercise or physical activity. Being overweight. Having too much fat, sugar, calories, or salt (sodium) in your diet. Drinking too much alcohol. Other risk factors include: Having a personal history of heart disease, diabetes, high cholesterol, or kidney disease. Stress. Having a family history of high blood pressure and high cholesterol. Having obstructive sleep apnea. Age. The risk increases with age. What are the signs or symptoms? High blood pressure may not cause symptoms. Very high blood pressure (hypertensive crisis) may cause: Headache. Fast or irregular heartbeats (palpitations). Shortness of breath. Nosebleed. Nausea and vomiting. Vision changes. Severe chest pain, dizziness, and seizures. How is this diagnosed? This condition is diagnosed by  measuring your blood pressure while you are seated, with your arm resting on a flat surface, your legs uncrossed, and your feet flat on the floor. The cuff of the blood pressure monitor will be placed directly against the skin of your upper arm at the level of your heart. Blood pressure should be measured at least twice using the same arm. Certain conditions can cause a difference in blood pressure between your right and left arms. If you have a high blood pressure reading during one visit or you have normal blood pressure with other risk factors, you may be asked to: Return on a different day to have your blood pressure checked again. Monitor your blood pressure at home for 1 week or longer. If you are diagnosed with hypertension, you may have other blood or imaging tests to help your health care provider understand your overall risk for other conditions. How is this treated? This condition is treated by making healthy lifestyle changes, such as eating healthy foods, exercising more, and reducing your alcohol intake. You may be referred for counseling on a healthy diet and physical activity. Your health care provider may prescribe medicine if lifestyle changes are not enough to get your blood pressure under control and if: Your systolic blood pressure is above 130. Your diastolic blood pressure is above 80. Your personal target blood pressure may vary depending on your medical conditions, your age, and other factors. Follow these instructions at home: Eating and drinking  Eat a diet that is high in fiber and potassium, and low in sodium, added sugar, and fat. An example of this eating plan is called the DASH diet. DASH stands for Dietary Approaches to Stop Hypertension. To eat this way: Eat  plenty of fresh fruits and vegetables. Try to fill one half of your plate at each meal with fruits and vegetables. Eat whole grains, such as whole-wheat pasta, brown rice, or whole-grain bread. Fill about one  fourth of your plate with whole grains. Eat or drink low-fat dairy products, such as skim milk or low-fat yogurt. Avoid fatty cuts of meat, processed or cured meats, and poultry with skin. Fill about one fourth of your plate with lean proteins, such as fish, chicken without skin, beans, eggs, or tofu. Avoid pre-made and processed foods. These tend to be higher in sodium, added sugar, and fat. Reduce your daily sodium intake. Many people with hypertension should eat less than 1,500 mg of sodium a day. Do not drink alcohol if: Your health care provider tells you not to drink. You are pregnant, may be pregnant, or are planning to become pregnant. If you drink alcohol: Limit how much you have to: 0-1 drink a day for women. 0-2 drinks a day for men. Know how much alcohol is in your drink. In the [place] one drink equals one 12 oz bottle of beer (355 mL), one 5 oz glass of wine (148 mL), or one 1 oz glass of hard liquor (44 mL). Lifestyle  Work with your health care provider to maintain a healthy body weight or to lose weight. Ask what an ideal weight is for you. Get at least 30 minutes of exercise that causes your heart to beat faster (aerobic exercise) most days of the week. Activities may include walking, swimming, or biking. Include exercise to strengthen your muscles (resistance exercise), such as Pilates or lifting weights, as part of your weekly exercise routine. Try to do these types of exercises for 30 minutes at least 3 days a week. Do not use any products that contain nicotine or tobacco. These products include cigarettes, chewing tobacco, and vaping devices, such as e-cigarettes. If you need help quitting, ask your health care provider. Monitor your blood pressure at home as told by your health care provider. Keep all follow-up visits. This is important. Medicines Take over-the-counter and prescription medicines only as told by your health care provider. Follow directions carefully. Blood  pressure medicines must be taken as prescribed. Do not skip doses of blood pressure medicine. Doing this puts you at risk for problems and can make the medicine less effective. Ask your health care provider about side effects or reactions to medicines that you should watch for. Contact a health care provider if you: Think you are having a reaction to a medicine you are taking. Have headaches that keep coming back (recurring). Feel dizzy. Have swelling in your ankles. Have trouble with your vision. Get help right away if you: Develop a severe headache or confusion. Have unusual weakness or numbness. Feel faint. Have severe pain in your chest or abdomen. Vomit repeatedly. Have trouble breathing. These symptoms may be an emergency. Get help right away. Call 911. Do not wait to see if the symptoms will go away. Do not drive yourself to the hospital. Summary Hypertension is when the force of blood pumping through your arteries is too strong. If this condition is not controlled, it may put you at risk for serious complications. Your personal target blood pressure may vary depending on your medical conditions, your age, and other factors. For most people, a normal blood pressure is less than 120/80. Hypertension is treated with lifestyle changes, medicines, or a combination of both. Lifestyle changes include losing weight, eating a healthy,  low-sodium diet, exercising more, and limiting alcohol. This information is not intended to replace advice given to you by your health care provider. Make sure you discuss any questions you have with your health care provider. Document Revised: 09/18/2021 Document Reviewed: 09/18/2021 Elsevier Patient Education  2024 [employer].

## 2024-03-04 NOTE — Progress Notes (Unsigned)
 Subjective:  Patient ID: [name], female    DOB: 04-18-1933  Age: 89 y.o. MRN: [id number]  CC: Hypertension   HPI ZAHRA PEFFLEY presents for f/up ----  Discussed the use of AI scribe software for clinical note transcription with the patient, who gave verbal consent to proceed.  History of Present Illness   [name] is an 89 year old female who presents with back and shoulder pain.  She experiences persistent back and shoulder pain, which initially led her to the emergency room due to associated nausea and vomiting. While the nausea and vomiting have resolved, the pain remains significant, necessitating the use of hydrocodone for management. She requests a refill as she has only two pills left.  She feels the effects of elevated blood pressure in her body, noting that it is typically high during clinic visits. She attributes some of her symptoms to her age.  She experiences dizziness, particularly when standing up, which requires her to pause before walking to prevent falls.       Outpatient Medications Prior to Visit  Medication Sig Dispense Refill   alendronate (FOSAMAX) 70 MG tablet Take 70 mg by mouth once a week.     carvedilol (COREG) 12.5 MG tablet Take 1 tablet (12.5 mg total) by mouth 2 (two) times daily with a meal. 180 tablet 3   Cholecalciferol 2000 units TABS Take 2 tablets (4,000 Units total) by mouth daily. 180 tablet 1   clonazePAM (KLONOPIN) 1 MG tablet TAKE 1 TABLET(1 MG) BY MOUTH TWICE DAILY AS NEEDED FOR ANXIETY 60 tablet 2   omeprazole (PRILOSEC) 40 MG capsule TAKE 1 CAPSULE(40 MG) BY MOUTH DAILY 90 capsule 0   oxybutynin (DITROPAN-XL) 10 MG 24 hr tablet Take 10 mg by mouth daily.     promethazine (PHENERGAN) 12.5 MG tablet TAKE 1 TABLET(12.5 MG) BY MOUTH EVERY 8 HOURS AS NEEDED FOR NAUSEA OR VOMITING 20 tablet 2   rivaroxaban (XARELTO) 20 MG TABS tablet Take 1 tablet (20 mg total) by mouth daily with supper. 90 tablet 1   spironolactone (ALDACTONE) 25 MG  tablet Take 1 tablet (25 mg total) by mouth daily. 90 tablet 3   torsemide (DEMADEX) 20 MG tablet TAKE 1 TABLET BY MOUTH EVERY DAY 90 tablet 1   traZODone (DESYREL) 100 MG tablet TAKE 1 TABLET(100 MG) BY MOUTH AT BEDTIME 90 tablet 1   albuterol (PROVENTIL) (2.5 MG/3ML) 0.083% nebulizer solution INHALE CONTENTS OF 1 VIAL PER NEBULIZER EVERY 6 HOURS AS NEEDED FOR WHEEZING OR SHORTNESS OF BREATH 225 mL 3   hydrALAZINE (APRESOLINE) 25 MG tablet Take 1 tablet (25 mg total) by mouth 2 (two) times daily. 180 tablet 3   cloNIDine (CATAPRES) 0.1 MG tablet Take 1 tablet (0.1 mg total) by mouth every 6 (six) hours as needed (withdrawal symptoms). (Patient not taking: Reported on 03/04/2024) 15 tablet 0   HYDROcodone-acetaminophen (NORCO) 10-325 MG tablet Take 1 tablet by mouth every 8 (eight) hours as needed for up to 5 days. (Patient not taking: Reported on 03/04/2024) 15 tablet 0   No facility-administered medications prior to visit.    ROS Review of Systems  Constitutional: Negative.  Negative for appetite change, chills, diaphoresis and fatigue.  HENT: Negative.    Eyes: Negative.   Respiratory: Negative.  Negative for cough, chest tightness, shortness of breath and wheezing.   Cardiovascular:  Negative for chest pain, palpitations and leg swelling.  Gastrointestinal:  Negative for abdominal pain, constipation, diarrhea, nausea and vomiting.  Endocrine: Negative.   Genitourinary: Negative.  Negative for difficulty urinating.  Musculoskeletal:  Positive for arthralgias and back pain. Negative for joint swelling and myalgias.  Skin: Negative.   Neurological: Negative.  Negative for dizziness and weakness.  Hematological:  Negative for adenopathy. Does not bruise/bleed easily.  Psychiatric/Behavioral:  Positive for confusion and decreased concentration.     Objective:  BP (!) 152/94 (BP Location: Left Arm, Patient Position: Sitting, Cuff Size: Normal)   Pulse 83   Temp 98.7 F (37.1 C) (Temporal)    Resp 16   Ht 5\' 3"  (1.6 m)   Wt 187 lb 12.8 oz (85.2 kg)   SpO2 96%   BMI 33.27 kg/m   BP Readings from Last 3 Encounters:  03/04/24 (!) 152/94  02/08/24 (!) 148/85  02/02/24 (!) 170/105    Wt Readings from Last 3 Encounters:  03/04/24 187 lb 12.8 oz (85.2 kg)  02/09/24 188 lb (85.3 kg)  02/02/24 188 lb (85.3 kg)    Physical Exam Vitals reviewed.  HENT:     Nose: Nose normal.     Mouth/Throat:     Mouth: Mucous membranes are moist.  Eyes:     General: No scleral icterus.    Conjunctiva/sclera: Conjunctivae normal.  Cardiovascular:     Rate and Rhythm: Normal rate and regular rhythm.     Heart sounds: Murmur heard.     Systolic murmur is present with a grade of 1/6.     No friction rub. No gallop.  Pulmonary:     Effort: Pulmonary effort is normal.     Breath sounds: No stridor. No wheezing, rhonchi or rales.  Abdominal:     General: Abdomen is flat.     Palpations: There is no mass.     Tenderness: There is no abdominal tenderness. There is no guarding.     Hernia: No hernia is present.  Musculoskeletal:        General: Normal range of motion.     Cervical back: Neck supple.     Right lower leg: No edema.     Left lower leg: No edema.  Lymphadenopathy:     Cervical: No cervical adenopathy.  Skin:    General: Skin is warm and dry.  Neurological:     General: No focal deficit present.     Mental Status: She is alert and oriented to person, place, and time.  Psychiatric:        Mood and Affect: Mood normal.        Behavior: Behavior normal.     Lab Results  Component Value Date   WBC 10.5 02/08/2024   HGB 14.5 02/08/2024   HCT 46.0 02/08/2024   PLT 163 02/08/2024   GLUCOSE 115 (H) 02/08/2024   CHOL 175 10/20/2023   TRIG 135.0 10/20/2023   HDL 49.90 10/20/2023   LDLDIRECT 121.0 06/12/2017   LDLCALC 98 10/20/2023   ALT 13 02/08/2024   AST 15 02/08/2024   NA 136 02/08/2024   K 3.1 (L) 02/08/2024   CL 98 02/08/2024   CREATININE 0.98 02/08/2024    BUN 29 (H) 02/08/2024   CO2 28 02/08/2024   TSH 2.17 10/20/2023   INR 1.0 08/11/2020   HGBA1C 4.8 09/19/2021    No results found.  Assessment & Plan:   Essential hypertension- She has not achieved her BP goal. Will restart hydralazine. -     hydrALAZINE HCl; Take 1 tablet (25 mg total) by mouth 3 (three) times daily.  Dispense:  270 tablet; Refill: 0  Primary osteoarthritis involving multiple joints -     HYDROcodone-Acetaminophen; Take 1 tablet by mouth every 8 (eight) hours as needed.  Dispense: 90 tablet; Refill: 0  Chronic bilateral low back pain without sciatica -     HYDROcodone-Acetaminophen; Take 1 tablet by mouth every 8 (eight) hours as needed.  Dispense: 90 tablet; Refill: 0  Chronic diastolic CHF (congestive heart failure) (HCC)- No signs of volume overload. -     hydrALAZINE HCl; Take 1 tablet (25 mg total) by mouth 3 (three) times daily.  Dispense: 270 tablet; Refill: 0     Follow-up: Return in about 3 months (around 06/03/2024).  Sanda Linger, MD

## 2024-03-05 ENCOUNTER — Other Ambulatory Visit: Payer: Self-pay | Admitting: Internal Medicine

## 2024-03-05 DIAGNOSIS — J4520 Mild intermittent asthma, uncomplicated: Secondary | ICD-10-CM

## 2024-03-05 MED ORDER — HYDRALAZINE HCL 25 MG PO TABS: 25.0000 mg | ORAL_TABLET | Freq: Three times a day (TID) | ORAL | 0 refills | Status: DC

## 2024-03-05 NOTE — Telephone Encounter (Signed)
 Medication has been resent with a 90 day supply.

## 2024-03-10 ENCOUNTER — Other Ambulatory Visit: Payer: Self-pay | Admitting: Internal Medicine

## 2024-03-12 ENCOUNTER — Ambulatory Visit (HOSPITAL_BASED_OUTPATIENT_CLINIC_OR_DEPARTMENT_OTHER): Payer: Medicare HMO | Admitting: Cardiology

## 2024-03-15 ENCOUNTER — Telehealth: Payer: Self-pay

## 2024-03-15 NOTE — Telephone Encounter (Signed)
 Copied from CRM [id number]. Topic: Clinical - Medication Question >> Mar 15, 2024  2:54 PM Aisha D wrote: Reason for CRM: Patient stated that she is anxious and nervous. Patient stated that she has been without medication since the holiday and is unable to eat anything. Patient wants to have the medication sent to the pharmacy today for pick up. Patient stated that if she doesn't get the medication today she might have to go into the ER. Patient declined speaking with Nurse Triage stated she just wants the medication refilled.

## 2024-03-15 NOTE — Telephone Encounter (Signed)
 Copied from CRM [id number]. Topic: Clinical - Medication Question >> Mar 15, 2024  8:03 AM [name] wrote: Reason for CRM: Patient called in to follow up on prescription due to pharmacy advising her they have not received a response. Stated she's been out of medication and really needs it or else she will not make it. Would like to know if Dr. Rochelle Chu could approve today.

## 2024-03-16 ENCOUNTER — Other Ambulatory Visit: Payer: Self-pay | Admitting: Internal Medicine

## 2024-03-16 ENCOUNTER — Ambulatory Visit: Payer: Self-pay

## 2024-03-16 MED ORDER — CLONAZEPAM 1 MG PO TABS: 1.0000 mg | ORAL_TABLET | Freq: Two times a day (BID) | ORAL | 2 refills | Status: DC | PRN

## 2024-03-16 NOTE — Telephone Encounter (Signed)
 Copied from CRM [id number]. Topic: Clinical - Prescription Issue >> Mar 16, 2024  9:17 AM Palma Bob wrote: Reason for CRM: Patient states she is not going to make it without her anxiety medication( clonazePAM  (KLONOPIN ) 1 MG tablet). Patient has been calling in since yesterday about updates, Pharmacy still has not received the prescription.

## 2024-03-16 NOTE — Telephone Encounter (Signed)
 I have sent to you the refill request.

## 2024-03-16 NOTE — Telephone Encounter (Signed)
 Copied from CRM [id number]. Topic: Clinical - Medication Refill >> Mar 16, 2024  1:40 PM Palma Bob wrote: Most Recent Primary Care Visit:  Provider: Arcadio Knuckles  Department: [facility] [place]  Visit Type: OFFICE VISIT  Date: 03/04/2024  Medication: clonazePAM  (KLONOPIN ) 1 MG tablet  Has the patient contacted their pharmacy? Yes (Agent: If no, request that the patient contact the pharmacy for the refill. If patient does not wish to contact the pharmacy document the reason why and proceed with request.) (Agent: If yes, when and what did the pharmacy advise?)  Is this the correct pharmacy for this prescription? Yes If no, delete pharmacy and type the correct one.  This is the patient's preferred pharmacy:  [id number] [place], [place] - [address]  [place] [address] Phone: [phone] Fax: [phone]  [facility] - [place], [place] - [address] [place] [address] Phone: [phone] Fax: [phone]  [id number] [place], [place] - [address] AT [address] [place] [address] Phone: [phone] Fax: [phone]  [facility] [address] [place] [address] Phone: [phone] Fax: [phone]   Has the prescription been filled recently? Yes  Is the patient out of the medication? Yes  Has the patient been seen for an appointment in the last year OR does the patient have an upcoming appointment? Yes  Can we respond through MyChart? Yes  Agent: Please be advised that Rx refills may take up to 3 business days. We ask that you follow-up with your pharmacy.

## 2024-03-16 NOTE — Telephone Encounter (Signed)
 Chief Complaint: Anxiety/needing Klonopin  refilled-requested 03/10/2024 Symptoms: little appetite, difficulty getting  Frequency: last few days Pertinent Negatives: Patient denies SI, HI Disposition: [] ED /[] Urgent Care (no appt availability in office) / [] Appointment(In office/virtual)/ []  [place] [facility]/ [] Home Care/ [x] Refused Recommended Disposition /[] [facility]/ [x]  Follow-up with PCP Additional Notes: patient called very upset in regards to medication refill for Klonopin . Patient was seen by PCP on 03/04/2024 for office visit. Medication refill was placed on 03/10/2024 for Klonopin . Patient was very upset speaking with Nurse Triage. Patient reports have little to no appetite and having difficulty completing activities during the day. Patient states "I don't understand why my med can't be filled." With symptoms that patient is having, patient is recommended to the ED but patient refused stating "well I can't do that-Im going to go lay down." Patient states "I just want my medication refilled." Patient denies SI and HI. Patient is asking for refill to be completed and asking for a phone call. Patient verbalized understanding of plan and all questions answered at this time.  Reason for Disposition  [1] Prescription refill request for ESSENTIAL medicine (i.e., likelihood of harm to patient if not taken) AND [2] triager unable to refill per department policy  Patient sounds very sick or weak to the triager  Answer Assessment - Initial Assessment Questions 1. DRUG NAME: "What medicine do you need to have refilled?"     Klonopin  2. REFILLS REMAINING: "How many refills are remaining?" (Note: The label on the medicine or pill bottle will show how many refills are remaining. If there are no refills remaining, then a renewal may be needed.)     0 3. EXPIRATION DATE: "What is the expiration date?" (Note: The label states when the prescription will expire, and thus can no longer be  refilled.)     05/25/2024 4. PRESCRIBING HCP: "Who prescribed it?" Reason: If prescribed by specialist, call should be referred to that group.     Sandra Crouch MD 5. SYMPTOMS: "Do you have any symptoms?"     Increases anxiety-been a week since medication was taken  Answer Assessment - Initial Assessment Questions 1. CONCERN: "Did anything happen that prompted you to call today?"      Increases in anxiety-been out of medication since last Wednesday 2. ANXIETY SYMPTOMS: "Can you describe how you (your loved one; patient) have been feeling?" (e.g., tense, restless, panicky, anxious, keyed up, overwhelmed, sense of impending doom).      Overwhelmed.  3. ONSET: "How long have you been feeling this way?" (e.g., hours, days, weeks)     Last couple of days 4. SEVERITY: "How would you rate the level of anxiety?" (e.g., 0 - 10; or mild, moderate, severe).     Moderate to severe 5. FUNCTIONAL IMPAIRMENT: "How have these feelings affected your ability to do daily activities?" "Have you had more difficulty than usual doing your normal daily activities?" (e.g., getting better, same, worse; self-care, school, work, interactions)     Having difficulty eating-no appetite and having difficulty getting daily activities completed.  6. HISTORY: "Have you felt this way before?" "Have you ever been diagnosed with an anxiety problem in the past?" (e.g., generalized anxiety disorder, panic attacks, PTSD). If Yes, ask: "How was this problem treated?" (e.g., medicines, counseling, etc.)     yes 7. RISK OF HARM - SUICIDAL IDEATION: "Do you ever have thoughts of hurting or killing yourself?" If Yes, ask:  "Do you have these feelings now?" "Do you have a plan  on how you would do this?"     No 8. TREATMENT:  "What has been done so far to treat this anxiety?" (e.g., medicines, relaxation strategies). "What has helped?"     medications 9. TREATMENT - THERAPIST: "Do you have a counselor or therapist? Name?"     N/A 10.  POTENTIAL TRIGGERS: "Do you drink caffeinated beverages (e.g., coffee, colas, teas), and how much daily?" "Do you drink alcohol  or use any drugs?" "Have you started any new medicines recently?"       Drink a soda every once in a while 11. PATIENT SUPPORT: "Who is with you now?" "Who do you live with?" "Do you have family or friends who you can talk to?"        Lives with family 12. OTHER SYMPTOMS: "Do you have any other symptoms?" (e.g., feeling depressed, trouble concentrating, trouble sleeping, trouble breathing, palpitations or fast heartbeat, chest pain, sweating, nausea, or diarrhea)       Little appetite.  Protocols used: Medication Refill and Renewal Call-A-AH, Anxiety and Panic Attack-A-AH

## 2024-03-18 NOTE — Telephone Encounter (Signed)
 This has been handled.

## 2024-03-23 ENCOUNTER — Other Ambulatory Visit: Payer: Self-pay | Admitting: Family Medicine

## 2024-03-24 ENCOUNTER — Telehealth: Payer: Self-pay | Admitting: Family Medicine

## 2024-03-24 NOTE — Telephone Encounter (Signed)
 Called and spoke with patient. She has been taking Fosamax  with no issues. She would like to continue on this medication.   RX refilled.

## 2024-03-24 NOTE — Telephone Encounter (Signed)
 Notes in pt chart - Per PCP dont refill Mobic  or Fosamax   due to difficulty swallowing  Forwarding to Dr. Alease Hunter.

## 2024-03-24 NOTE — Telephone Encounter (Signed)
 Your primary care provider recommended not using Fosamax  because you have difficulty swallowing.  What your thoughts on this?  There are some other options that are similar including an IV infusion of a medicine similar to Fosamax  that you get once a year.

## 2024-03-29 ENCOUNTER — Encounter: Payer: Self-pay | Admitting: Pulmonary Disease

## 2024-03-29 ENCOUNTER — Ambulatory Visit: Admitting: Pulmonary Disease

## 2024-03-29 VITALS — BP 140/86 | HR 87 | Temp 97.7°F | Ht 63.0 in | Wt 188.8 lb

## 2024-03-29 DIAGNOSIS — G4733 Obstructive sleep apnea (adult) (pediatric): Secondary | ICD-10-CM

## 2024-03-29 DIAGNOSIS — R0602 Shortness of breath: Secondary | ICD-10-CM

## 2024-03-29 NOTE — Patient Instructions (Signed)
 I will see you back in 6 months  Continue with your nebulizer  Continue oxygen  at night  Call us  with significant concerns  Try and stay active

## 2024-03-29 NOTE — Progress Notes (Signed)
 Subjective:    Patient ID: [name], female    DOB: [birth year], 89 y.o.   MRN: [id number]  Patient being seen for history of obstructive sleep apnea  Has only been using oxygen  recently Has been feeling relatively well  No significant concerns with sleep  Diagnosed with obstructive sleep apnea in 2013 - Has not been able to tolerate CPAP recently  Usually goes to bed between 10 and 11 Wakes up about 5 AM  Feels rested   Past Medical History:  Diagnosis Date   Abdominal pain    Anemia    NOS   Anxiety    Back pain    Bruises easily    Chronic diastolic CHF (congestive heart failure) (HCC)    a. 06/2013 EF 65-70%.   Chronic headaches    HISTORY OF    Depression    Diabetes mellitus    type II  DIET CONTROLLED   Diarrhea    Hepatitis    AGE 30S   Hyperlipidemia    Hypertension    Mild aortic stenosis    a. 06/2013 Echo: EF 65-70%, mod LVH with focal basal hypertrophy, very mild AS.   Osteoarthritis    Oxygen  desaturation during sleep    USES 2 LITERS BEDTIME   VIA CPAP 06/2012 [facility]    Shortness of breath    WITH EXERTION USES 2 L O2 BEDTIME   Sleep apnea    CPAP WITH O2 2 LITERS 2013 ([facility])   Wears dentures    Wears glasses    Family History  Problem Relation Age of Onset   Cancer Father        prostate cancer   Heart disease Sister    Heart disease Brother    Alcohol  abuse Other    Hypertension Other    Kidney disease Other    Mental illness Other    Emphysema Brother    Social History   Socioeconomic History   Marital status: Widowed    Spouse name: Not on file   Number of children: 5   Years of education: Not on file   Highest education level: Not on file  Occupational History   Occupation: retired     Associate Professor: RETIRED    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[employer]  Tobacco Use   Smoking status: Never    Passive exposure: Never   Smokeless tobacco: Never  Vaping Use   Vaping status: Never Used  Substance and Sexual Activity   Alcohol   use: No   Drug use: No   Sexual activity: Never  Other Topics Concern   Not on file  Social History Narrative   Widowed; 5 children   Currently on Oxygen  (as of 02/09/24) - 2 liters   Social Drivers of Corporate investment banker Strain: Low Risk  (02/09/2024)   Overall Financial Resource Strain (CARDIA)    Difficulty of Paying Living Expenses: Not hard at all  Food Insecurity: No Food Insecurity (02/09/2024)   Hunger Vital Sign    Worried About Running Out of Food in the Last Year: Never true    Ran Out of Food in the Last Year: Never true  Transportation Needs: No Transportation Needs (02/09/2024)   PRAPARE - Administrator, Civil Service (Medical): No    Lack of Transportation (Non-Medical): No  Physical Activity: Insufficiently Active (02/09/2024)   Exercise Vital Sign    Days of Exercise per Week: 5 days    Minutes of  Exercise per Session: 20 min  Stress: No Stress Concern Present (02/09/2024)   [employer] of Occupational Health - Occupational Stress Questionnaire    Feeling of Stress : Not at all  Social Connections: Socially Isolated (02/09/2024)   Social Connection and Isolation Panel [NHANES]    Frequency of Communication with Friends and Family: More than three times a week    Frequency of Social Gatherings with Friends and Family: Once a week    Attends Religious Services: Never    Database administrator or Organizations: No    Attends Banker Meetings: Never    Marital Status: Widowed  Intimate Partner Violence: Not At Risk (02/09/2024)   Humiliation, Afraid, Rape, and Kick questionnaire    Fear of Current or Ex-Partner: No    Emotionally Abused: No    Physically Abused: No    Sexually Abused: No   Review of Systems  Constitutional:  Negative for fever and unexpected weight change.  HENT:  Positive for congestion and trouble swallowing. Negative for dental problem, ear pain, nosebleeds, postnasal drip, rhinorrhea, sinus pressure,  sneezing and sore throat.   Eyes:  Negative for redness and itching.  Respiratory:  Positive for shortness of breath. Negative for cough, chest tightness and wheezing.   Cardiovascular:  Positive for leg swelling. Negative for palpitations.  Gastrointestinal:  Negative for nausea and vomiting.  Genitourinary:  Negative for dysuria.  Musculoskeletal:  Negative for back pain and joint swelling.  Skin:  Negative for rash.  Allergic/Immunologic: Negative.  Negative for environmental allergies, food allergies and immunocompromised state.  Neurological:  Negative for headaches.  Hematological:  Bruises/bleeds easily.  Psychiatric/Behavioral:  Negative for dysphoric mood. The patient is nervous/anxious and is hyperactive.        Objective:   Physical Exam Constitutional:      Appearance: She is obese.  HENT:     Head: Normocephalic and atraumatic.     Nose: No congestion.  Eyes:     Pupils: Pupils are equal, round, and reactive to light.  Cardiovascular:     Rate and Rhythm: Normal rate and regular rhythm.  Pulmonary:     Effort: Pulmonary effort is normal. No respiratory distress.     Breath sounds: Normal breath sounds. No stridor. No wheezing or rhonchi.  Musculoskeletal:     Cervical back: No rigidity or tenderness.  Skin:    Coloration: Skin is not jaundiced.  Neurological:     Mental Status: She is alert.  Psychiatric:        Mood and Affect: Mood normal.    Vitals:   03/29/24 1345  BP: (!) 140/86  Pulse: 87  Temp: 97.7 F (36.5 C)  SpO2: 98%    Compliance not available Has not been using her CPAP  Assessment & Plan:  .  Nocturnal desaturations - On oxygen  segmentation at night  .  Obstructive sleep apnea - Intolerant of CPAP  Continues to use oxygen  nightly  Continues bronchodilator treatments via nebulizer during the day  Not having significant concerns at present  Follow-up in 6 months  Encouraged to call with significant concerns

## 2024-04-02 ENCOUNTER — Other Ambulatory Visit: Payer: Self-pay | Admitting: Internal Medicine

## 2024-04-02 DIAGNOSIS — G4730 Sleep apnea, unspecified: Secondary | ICD-10-CM

## 2024-04-05 ENCOUNTER — Ambulatory Visit (HOSPITAL_BASED_OUTPATIENT_CLINIC_OR_DEPARTMENT_OTHER): Admitting: Family

## 2024-04-06 ENCOUNTER — Encounter (HOSPITAL_COMMUNITY): Payer: Self-pay | Admitting: Pharmacy Technician

## 2024-04-06 ENCOUNTER — Emergency Department (HOSPITAL_COMMUNITY)

## 2024-04-06 ENCOUNTER — Ambulatory Visit: Payer: Self-pay

## 2024-04-06 ENCOUNTER — Other Ambulatory Visit: Payer: Self-pay

## 2024-04-06 ENCOUNTER — Emergency Department (HOSPITAL_COMMUNITY)
Admission: EM | Admit: 2024-04-06 | Discharge: 2024-04-06 | Disposition: A | Discharge status: Home / Self Care | Attending: Emergency Medicine | Admitting: Emergency Medicine

## 2024-04-06 DIAGNOSIS — Z79899 Other long term (current) drug therapy: Secondary | ICD-10-CM | POA: Insufficient documentation

## 2024-04-06 DIAGNOSIS — I509 Heart failure, unspecified: Secondary | ICD-10-CM | POA: Diagnosis not present

## 2024-04-06 DIAGNOSIS — I10 Essential (primary) hypertension: Secondary | ICD-10-CM | POA: Diagnosis not present

## 2024-04-06 DIAGNOSIS — E119 Type 2 diabetes mellitus without complications: Secondary | ICD-10-CM | POA: Insufficient documentation

## 2024-04-06 DIAGNOSIS — K449 Diaphragmatic hernia without obstruction or gangrene: Secondary | ICD-10-CM | POA: Diagnosis not present

## 2024-04-06 DIAGNOSIS — K5730 Diverticulosis of large intestine without perforation or abscess without bleeding: Secondary | ICD-10-CM | POA: Diagnosis not present

## 2024-04-06 DIAGNOSIS — I110 Hypertensive heart disease with heart failure: Secondary | ICD-10-CM | POA: Insufficient documentation

## 2024-04-06 DIAGNOSIS — R10819 Abdominal tenderness, unspecified site: Secondary | ICD-10-CM | POA: Diagnosis present

## 2024-04-06 DIAGNOSIS — K5792 Diverticulitis of intestine, part unspecified, without perforation or abscess without bleeding: Secondary | ICD-10-CM | POA: Diagnosis not present

## 2024-04-06 DIAGNOSIS — D3501 Benign neoplasm of right adrenal gland: Secondary | ICD-10-CM | POA: Diagnosis not present

## 2024-04-06 DIAGNOSIS — K4020 Bilateral inguinal hernia, without obstruction or gangrene, not specified as recurrent: Secondary | ICD-10-CM | POA: Diagnosis not present

## 2024-04-06 LAB — COMPREHENSIVE METABOLIC PANEL WITH GFR
ALT: 16 U/L (ref 0–44)
AST: 20 U/L (ref 15–41)
Albumin: 3.5 g/dL (ref 3.5–5.0)
Alkaline Phosphatase: 35 U/L — ABNORMAL LOW (ref 38–126)
Anion gap: 9 (ref 5–15)
BUN: 18 mg/dL (ref 8–23)
CO2: 29 mmol/L (ref 22–32)
Calcium: 9.1 mg/dL (ref 8.9–10.3)
Chloride: 100 mmol/L (ref 98–111)
Creatinine, Ser: 0.98 mg/dL (ref 0.44–1.00)
GFR, Estimated: 55 mL/min — ABNORMAL LOW (ref >60)
Glucose, Bld: 143 mg/dL — ABNORMAL HIGH (ref 70–99)
Potassium: 3.7 mmol/L (ref 3.5–5.1)
Sodium: 138 mmol/L (ref 135–145)
Total Bilirubin: 1.4 mg/dL — ABNORMAL HIGH (ref 0.0–1.2)
Total Protein: 5.9 g/dL — ABNORMAL LOW (ref 6.5–8.1)

## 2024-04-06 LAB — CBC
HCT: 41.1 % (ref 36.0–46.0)
Hemoglobin: 12.7 g/dL (ref 12.0–15.0)
MCH: 30.9 pg (ref 26.0–34.0)
MCHC: 30.9 g/dL (ref 30.0–36.0)
MCV: 100 fL (ref 80.0–100.0)
Platelets: 153 10*3/uL (ref 150–400)
RBC: 4.11 MIL/uL (ref 3.87–5.11)
RDW: 12.7 % (ref 11.5–15.5)
WBC: 7.9 10*3/uL (ref 4.0–10.5)
nRBC: 0 % (ref 0.0–0.2)

## 2024-04-06 LAB — URINALYSIS, ROUTINE W REFLEX MICROSCOPIC
Bilirubin Urine: NEGATIVE
Glucose, UA: NEGATIVE mg/dL
Hgb urine dipstick: NEGATIVE
Ketones, ur: NEGATIVE mg/dL
Nitrite: NEGATIVE
Protein, ur: NEGATIVE mg/dL
Specific Gravity, Urine: 1.021 (ref 1.005–1.030)
pH: 7 (ref 5.0–8.0)

## 2024-04-06 LAB — LIPASE, BLOOD: Lipase: 24 U/L (ref 11–51)

## 2024-04-06 MED ORDER — AMOXICILLIN-POT CLAVULANATE 875-125 MG PO TABS: 1.0000 | ORAL_TABLET | Freq: Two times a day (BID) | ORAL | 0 refills | Status: DC

## 2024-04-06 MED ORDER — ONDANSETRON HCL 4 MG PO TABS: 4.0000 mg | ORAL_TABLET | Freq: Four times a day (QID) | ORAL | 0 refills | Status: DC

## 2024-04-06 MED ORDER — SODIUM CHLORIDE 0.9 % IV BOLUS
1000.0000 mL | Freq: Once | INTRAVENOUS | Status: AC
  Administered 2024-04-06: 1000 mL via INTRAVENOUS

## 2024-04-06 MED ORDER — IOHEXOL 350 MG/ML SOLN
75.0000 mL | Freq: Once | INTRAVENOUS | Status: AC | PRN
  Administered 2024-04-06: 75 mL via INTRAVENOUS

## 2024-04-06 MED ORDER — ONDANSETRON HCL 4 MG/2ML IJ SOLN
4.0000 mg | Freq: Once | INTRAMUSCULAR | Status: AC
  Administered 2024-04-06: 4 mg via INTRAVENOUS
  Filled 2024-04-06: qty 2

## 2024-04-06 MED ORDER — AMOXICILLIN-POT CLAVULANATE 875-125 MG PO TABS
1.0000 | ORAL_TABLET | Freq: Once | ORAL | Status: AC
  Administered 2024-04-06: 1 via ORAL
  Filled 2024-04-06: qty 1

## 2024-04-06 NOTE — Discharge Instructions (Signed)
 Stick with a very bland diet and Ensure over the next few days.  You will take the antibiotic 1 tab 2 times a day and use the nausea medicine as needed.  If you are not having bowel movements every day you can use either Colace or Dulcolax to help get your bowel movements going.  If you start having a fever, worsening vomiting, not improving return to the emergency room

## 2024-04-06 NOTE — Telephone Encounter (Signed)
  Chief Complaint: back Pain Symptoms: lower back pain, abdominal pain-rates pain at a 10 Frequency: 2 days Pertinent Negatives: Patient denies CP, SOB Disposition: [x] ED /[] Urgent Care (no appt availability in office) / [] Appointment(In office/virtual)/ []  [place] [facility]/ [] Home Care/ [] Refused Recommended Disposition /[] [place] [facility]/ []  Follow-up with PCP Additional Notes: patient calling with concerns for lower back pain and abdominal pain that she rates at a 10. Patient states pain started 2 days ago. Patient also endorses urinary urgency as well. Patient is also questioning about having hydrocodone  refilled for her. Per chart review, no active order for hydrocodone .  Per protocol, patient is recommended to Emergency Department due to symptoms. Patient endorses that she will be going to the ED. Patient verbalized understanding and all questions answered. Patient is wanting follow up call about hydrocodone  request.    Copied from CRM [id number]. Topic: Clinical - Red Word Triage >> Apr 06, 2024 10:51 AM Luane Rumps D wrote: Red Word that prompted transfer to Nurse Triage: Back pain, stomach hurts, 9/10. Reason for Disposition  [1] SEVERE back pain (e.g., excruciating) AND [2] sudden onset AND [3] age > 60 years  Answer Assessment - Initial Assessment Questions 1. ONSET: "When did the pain begin?"      Started 2 days ago 2. LOCATION: "Where does it hurt?" (upper, mid or lower back)     Lower back pain 3. SEVERITY: "How bad is the pain?"  (e.g., Scale 1-10; mild, moderate, or severe)   - MILD (1-3): Doesn't interfere with normal activities.    - MODERATE (4-7): Interferes with normal activities or awakens from sleep.    - SEVERE (8-10): Excruciating pain, unable to do any normal activities.      10/10 4. PATTERN: "Is the pain constant?" (e.g., yes, no; constant, intermittent)      constant 5. RADIATION: "Does the pain shoot into your legs or somewhere else?"     no 6.  CAUSE:  "What do you think is causing the back pain?"      unsure 7. BACK OVERUSE:  "Any recent lifting of heavy objects, strenuous work or exercise?"     no 8. MEDICINES: "What have you taken so far for the pain?" (e.g., nothing, acetaminophen , NSAIDS)     OTC medications 9. NEUROLOGIC SYMPTOMS: "Do you have any weakness, numbness, or problems with bowel/bladder control?"     no 10. OTHER SYMPTOMS: "Do you have any other symptoms?" (e.g., fever, abdomen pain, burning with urination, blood in urine)       Abdominal pain, urgency  Protocols used: Back Pain-A-AH

## 2024-04-06 NOTE — ED Provider Notes (Signed)
 Several days of nausea and abd pain with minimal vomiting.  No fever and back is also achy.  Labs are reassuring and VS wnl.  Pt given fluids and zofran .  CT and UA pending.  UA without significant signs of infection.  I have independently visualized and interpreted pt's images today.  CT today with no hydronephrosis or renal stones.  No evidence of appendicitis.  Radiology reports diffuse descending and sigmoid colonic diverticulosis with long segment wall thickening consistent with acute diverticulitis without perforation or abscess.  Findings discussed with the patient.  Discussed with the patient if she was feeling safe enough to go home with oral antibiotics of her she felt she needed hospitalization.  She reports as long as she has something for nausea at home she thinks that she can go home and take the antibiotics.  She was given return precautions which she feels comfortable with.    Almond Army, MD 04/06/24 [phone]

## 2024-04-06 NOTE — ED Notes (Signed)
 Went to CT

## 2024-04-06 NOTE — ED Triage Notes (Signed)
 Pt here POV with repots of feeling sick for the last few days. Pt with lower back pain and abdominal pain. Reports generalized body aches.  Taking sleeping pills otc to help with the symptoms.

## 2024-04-06 NOTE — Telephone Encounter (Signed)
**Note De-identified  [place] Obfuscation** Please advise 

## 2024-04-06 NOTE — ED Provider Notes (Signed)
 [place] EMERGENCY DEPARTMENT AT [facility] Provider Note   CSN: [id number] Arrival date & time: 04/06/24  1146     History {Add pertinent medical, surgical, social history, OB history to HPI:1} No chief complaint on file.   [name] is a 89 y.o. female.  89 year old female with prior medical history as detailed below presents for evaluation.  Patient complains of diffuse abdominal pain, poor appetite, nausea.  Symptoms began 2 to 3 days ago.  Patient reports she has had minimal to no p.o. intake for the last 48 hours.  She has been able to take approximately 2 ensures per day.  She reports that anything else makes her throw up.  She denies fever.  She complains of diffuse abdominal pain.  She denies diarrhea.  Prior medical history includes CHF, diabetes, hypertension, GERD, appendectomy, hysterectomy.  The history is provided by the patient.       Home Medications Prior to Admission medications   Medication Sig Start Date End Date Taking? Authorizing Provider  albuterol  (PROVENTIL ) (2.5 MG/3ML) 0.083% nebulizer solution INHALE CONTENTS OF 1 VIAL PER NEBULIZER EVERY 6 HOURS AS NEEDED FOR WHEEZING OR SHORTNESS OF BREATH 03/05/24   Arcadio Knuckles, MD  alendronate  (FOSAMAX ) 70 MG tablet TAKE 1 TABLET(70 MG) BY MOUTH 1 TIME A WEEK WITH A FULL GLASS OF WATER AND ON AN EMPTY STOMACH 03/24/24   Syliva Even, MD  carvedilol  (COREG ) 12.5 MG tablet Take 1 tablet (12.5 mg total) by mouth 2 (two) times daily with a meal. 08/19/23   Sheryle Donning, MD  Cholecalciferol  2000 units TABS Take 2 tablets (4,000 Units total) by mouth daily. 03/31/18   Arcadio Knuckles, MD  clonazePAM  (KLONOPIN ) 1 MG tablet Take 1 tablet (1 mg total) by mouth 2 (two) times daily as needed for anxiety. 03/16/24   Arcadio Knuckles, MD  hydrALAZINE  (APRESOLINE ) 25 MG tablet Take 1 tablet (25 mg total) by mouth 3 (three) times daily. 03/05/24 06/03/24  Arcadio Knuckles, MD  omeprazole  (PRILOSEC) 40 MG  capsule TAKE 1 CAPSULE(40 MG) BY MOUTH DAILY 08/27/23   Arcadio Knuckles, MD  oxybutynin (DITROPAN-XL) 10 MG 24 hr tablet Take 10 mg by mouth daily. 07/14/23   [provider]  promethazine  (PHENERGAN ) 12.5 MG tablet TAKE 1 TABLET(12.5 MG) BY MOUTH EVERY 8 HOURS AS NEEDED FOR NAUSEA OR VOMITING 04/05/24   Arcadio Knuckles, MD  rivaroxaban  (XARELTO ) 20 MG TABS tablet Take 1 tablet (20 mg total) by mouth daily with supper. 01/19/24   Sheryle Donning, MD  spironolactone  (ALDACTONE ) 25 MG tablet Take 1 tablet (25 mg total) by mouth daily. 08/19/23   Sheryle Donning, MD  torsemide  (DEMADEX ) 20 MG tablet TAKE 1 TABLET BY MOUTH EVERY DAY 10/25/23   Arcadio Knuckles, MD  traZODone  (DESYREL ) 100 MG tablet TAKE 1 TABLET(100 MG) BY MOUTH AT BEDTIME 04/05/24   Arcadio Knuckles, MD      Allergies    Amlodipine , Enalapril , Losartan  potassium, Metformin, and Morphine  and codeine    Review of Systems   Review of Systems  All other systems reviewed and are negative.   Physical Exam Updated Vital Signs BP (!) 126/59 (BP Location: Right Arm)   Pulse 86   Temp 98.2 F (36.8 C)   Resp 18   SpO2 95%  Physical Exam Vitals and nursing note reviewed.  Constitutional:      General: She is not in acute distress.    Appearance: Normal appearance. She  is well-developed.  HENT:     Head: Normocephalic and atraumatic.  Eyes:     Conjunctiva/sclera: Conjunctivae normal.     Pupils: Pupils are equal, round, and reactive to light.  Cardiovascular:     Rate and Rhythm: Normal rate and regular rhythm.     Heart sounds: Normal heart sounds.  Pulmonary:     Effort: Pulmonary effort is normal. No respiratory distress.     Breath sounds: Normal breath sounds.  Abdominal:     General: There is distension.     Palpations: Abdomen is soft.     Tenderness: There is abdominal tenderness.     Comments: Mild diffuse tenderness.  Minimal distention appreciated.  Musculoskeletal:        General: No  deformity. Normal range of motion.     Cervical back: Normal range of motion and neck supple.  Skin:    General: Skin is warm and dry.  Neurological:     General: No focal deficit present.     Mental Status: She is alert and oriented to person, place, and time.     ED Results / Procedures / Treatments   Labs (all labs ordered are listed, but only abnormal results are displayed) Labs Reviewed  COMPREHENSIVE METABOLIC PANEL WITH GFR - Abnormal; Notable for the following components:      Result Value   Glucose, Bld 143 (*)    Total Protein 5.9 (*)    Alkaline Phosphatase 35 (*)    Total Bilirubin 1.4 (*)    GFR, Estimated 55 (*)    All other components within normal limits  LIPASE, BLOOD  CBC  URINALYSIS, ROUTINE W REFLEX MICROSCOPIC    EKG None  Radiology No results found.  Procedures Procedures  {Document cardiac monitor, telemetry assessment procedure when appropriate:1}  Medications Ordered in ED Medications  sodium chloride  0.9 % bolus 1,000 mL (has no administration in time range)  ondansetron  (ZOFRAN ) injection 4 mg (has no administration in time range)    ED Course/ Medical Decision Making/ A&P   {   Click here for ABCD2, HEART and other calculatorsREFRESH Note before signing :1}                              Medical Decision Making Amount and/or Complexity of Data Reviewed Labs: ordered. Radiology: ordered.  Risk Prescription drug management.    Medical Screen Complete  This patient presented to the ED with complaint of abdominal pain, nausea.  This complaint involves an extensive number of treatment options. The initial differential diagnosis includes, but is not limited to, ***  This presentation is: {IllnessRisk:19196::"***","Acute","Chronic","Self-Limited","Previously Undiagnosed","Uncertain Prognosis","Complicated","Systemic Symptoms","Threat to Life/Bodily Function"}    Co morbidities that complicated the patient's  evaluation  ***   Additional history obtained:  Additional history obtained from {History source:19196::"EMS","Spouse","Family","Friend","Caregiver"} External records from outside sources obtained and reviewed including prior ED visits and prior Inpatient records.    Lab Tests:  I ordered and personally interpreted labs.  The pertinent results include:  ***   Imaging Studies ordered:  I ordered imaging studies including ***  I independently visualized and interpreted obtained imaging which showed *** I agree with the radiologist interpretation.   Cardiac Monitoring:  The patient was maintained on a cardiac monitor.  I personally viewed and interpreted the cardiac monitor which showed an underlying rhythm of: ***   Medicines ordered:  I ordered medication including ***  for ***  Reevaluation of the  patient after these medicines showed that the patient: {resolved/improved/worsened:23923::"improved"}    Test Considered:  ***   Critical Interventions:  ***   Consultations Obtained:  I consulted ***,  and discussed lab and imaging findings as well as pertinent plan of care.    Problem List / ED Course:  ***   Reevaluation:  After the interventions noted above, I reevaluated the patient and found that they have: {resolved/improved/worsened:23923::"improved"}   Social Determinants of Health:  ***   Disposition:  After consideration of the diagnostic results and the patients response to treatment, I feel that the patent would benefit from ***.    {Document critical care time when appropriate:1} {Document review of labs and clinical decision tools ie heart score, Chads2Vasc2 etc:1}  {Document your independent review of radiology images, and any outside records:1} {Document your discussion with family members, caretakers, and with consultants:1} {Document social determinants of health affecting pt's care:1} {Document your decision making why or why not  admission, treatments were needed:1} Final Clinical Impression(s) / ED Diagnoses Final diagnoses:  None    Rx / DC Orders ED Discharge Orders     None

## 2024-04-07 ENCOUNTER — Telehealth: Payer: Self-pay | Admitting: Internal Medicine

## 2024-04-07 ENCOUNTER — Telehealth: Payer: Self-pay

## 2024-04-07 NOTE — Telephone Encounter (Unsigned)
 Copied from CRM [id number]. Topic: Clinical - Medication Refill >> Apr 07, 2024 10:30 AM [name] wrote: Medication: Hydrocodone  2mg  [id number] (patient provided number on bottle)  Has the patient contacted their pharmacy? Yes (Agent: If no, request that the patient contact the pharmacy for the refill. If patient does not wish to contact the pharmacy document the reason why and proceed with request.) (Agent: If yes, when and what did the pharmacy advise?)  This is the patient's preferred pharmacy:  [facility] [address], [place] - [address] [place] [address] Phone: [phone] Fax: [phone]  Is this the correct pharmacy for this prescription? Yes If no, delete pharmacy and type the correct one.   Has the prescription been filled recently? Yes  Is the patient out of the medication? Yes  Has the patient been seen for an appointment in the last year OR does the patient have an upcoming appointment? Yes  Can we respond through MyChart? Yes  Agent: Please be advised that Rx refills may take up to 3 business days. We ask that you follow-up with your pharmacy.

## 2024-04-07 NOTE — Transitions of Care (Post Inpatient/ED Visit) (Signed)
 04/07/2024  Name: [name] MRN: [id number] DOB: 20-Aug-1933  Today's TOC FU Call Status: Today's TOC FU Call Status:: Successful TOC FU Call Completed TOC FU Call Complete Date: 04/07/24 Patient's Name and Date of Birth confirmed.  Transition Care Management Follow-up Telephone Call Date of Discharge: 04/06/24 Discharge Facility: [facility]) Type of Discharge: Emergency Department Reason for ED Visit: Other: (diverticulitis) How have you been since you were released from the hospital?: Better Any questions or concerns?: No  Items Reviewed: Did you receive and understand the discharge instructions provided?: Yes Medications obtained,verified, and reconciled?: Yes (Medications Reviewed) Any new allergies since your discharge?: No Dietary orders reviewed?: Yes Do you have support at home?: Yes People in Home [RPT]: child(ren), adult  Medications Reviewed Today: Medications Reviewed Today     Reviewed by Darrall Ellison, LPN (Licensed Practical Nurse) on 04/07/24 at 1533  Med List Status: <None>   Medication Order Taking? Sig Documenting Provider Last Dose Status Informant  albuterol  (PROVENTIL ) (2.5 MG/3ML) 0.083% nebulizer solution [id number] No INHALE CONTENTS OF 1 VIAL PER NEBULIZER EVERY 6 HOURS AS NEEDED FOR WHEEZING OR SHORTNESS OF Forbes Ida, MD Taking Active   alendronate  (FOSAMAX ) 70 MG tablet [id number] No TAKE 1 TABLET(70 MG) BY MOUTH 1 TIME A WEEK WITH A FULL GLASS OF WATER AND ON AN EMPTY STOMACH Syliva Even, MD Taking Active   amoxicillin -clavulanate (AUGMENTIN ) 875-125 MG tablet [id number]  Take 1 tablet by mouth every 12 (twelve) hours. Almond Army, MD  Active   carvedilol  (COREG ) 12.5 MG tablet [id number] No Take 1 tablet (12.5 mg total) by mouth 2 (two) times daily with a meal. Sheryle Donning, MD Taking Active   Cholecalciferol  2000 units TABS [id number] No Take 2 tablets (4,000 Units total) by mouth daily. Arcadio Knuckles, MD Taking  Active Self, Pharmacy Records  clonazePAM  (KLONOPIN ) 1 MG tablet [id number] No Take 1 tablet (1 mg total) by mouth 2 (two) times daily as needed for anxiety. Arcadio Knuckles, MD Taking Active   hydrALAZINE  (APRESOLINE ) 25 MG tablet [id number] No Take 1 tablet (25 mg total) by mouth 3 (three) times daily. Arcadio Knuckles, MD Taking Active   omeprazole  (PRILOSEC) 40 MG capsule [id number] No TAKE 1 CAPSULE(40 MG) BY MOUTH DAILY Arcadio Knuckles, MD Taking Active   ondansetron  (ZOFRAN ) 4 MG tablet [id number]  Take 1 tablet (4 mg total) by mouth every 6 (six) hours. Almond Army, MD  Active   oxybutynin (DITROPAN-XL) 10 MG 24 hr tablet [id number] No Take 10 mg by mouth daily. [provider] Taking Active            Med Note Collene Dawson, JACQUELINE L   Tue Aug 19, 2023  1:19 PM)    promethazine  (PHENERGAN ) 12.5 MG tablet [id number]  TAKE 1 TABLET(12.5 MG) BY MOUTH EVERY 8 HOURS AS NEEDED FOR NAUSEA OR VOMITING Arcadio Knuckles, MD  Active   rivaroxaban  (XARELTO ) 20 MG TABS tablet [id number] No Take 1 tablet (20 mg total) by mouth daily with supper. Sheryle Donning, MD Taking Active   spironolactone  (ALDACTONE ) 25 MG tablet [id number] No Take 1 tablet (25 mg total) by mouth daily. Sheryle Donning, MD Taking Active   torsemide  (DEMADEX ) 20 MG tablet [id number] No TAKE 1 TABLET BY MOUTH EVERY DAY Arcadio Knuckles, MD Taking Active   traZODone  (DESYREL ) 100 MG tablet [id number]  TAKE 1 TABLET(100 MG) BY MOUTH AT BEDTIME Arcadio Knuckles, MD  Active  Home Care and Equipment/Supplies: Were Home Health Services Ordered?: NA Any new equipment or medical supplies ordered?: NA  Functional Questionnaire: Do you need assistance with bathing/showering or dressing?: No Do you need assistance with meal preparation?: No Do you need assistance with eating?: No Do you have difficulty maintaining continence: No Do you need assistance with getting out of bed/getting out of a chair/moving?:  No Do you have difficulty managing or taking your medications?: No  Follow up appointments reviewed: PCP Follow-up appointment confirmed?: No (declined) MD Provider Line Number:[phone] Given: No Specialist Hospital Follow-up appointment confirmed?: NA Do you need transportation to your follow-up appointment?: No Do you understand care options if your condition(s) worsen?: Yes-patient verbalized understanding    SIGNATURE Darrall Ellison, LPN [facility] Nurse Health Advisor Direct Dial [phone]

## 2024-04-07 NOTE — Telephone Encounter (Signed)
 Please advise. I do not see this medication on her med list

## 2024-04-07 NOTE — Telephone Encounter (Unsigned)
 Copied from CRM [id number]. Topic: Clinical - Medication Refill >> Apr 07, 2024 10:30 AM [name] C wrote: Medication: Hydrocodone  2mg  [id number] (patient provided number on bottle)  Has the patient contacted their pharmacy? Yes (Agent: If no, request that the patient contact the pharmacy for the refill. If patient does not wish to contact the pharmacy document the reason why and proceed with request.) (Agent: If yes, when and what did the pharmacy advise?)  This is the patient's preferred pharmacy:  [facility] [address], [place] - [address] [place] [address] Phone: [phone] Fax: [phone]  Is this the correct pharmacy for this prescription? Yes If no, delete pharmacy and type the correct one.   Has the prescription been filled recently? Yes  Is the patient out of the medication? Yes  Has the patient been seen for an appointment in the last year OR does the patient have an upcoming appointment? Yes  Can we respond through MyChart? Yes  Agent: Please be advised that Rx refills may take up to 3 business days. We ask that you follow-up with your pharmacy.

## 2024-04-07 NOTE — Telephone Encounter (Signed)
 Pt called back & stated that she called the pharmacy & they told her that it was "phoned in incorrectly". I informed her that I didn't see where the medication was refilled yet but I reassured her that I would send back a message for her. She asked for a nurse to notify her when the med is refilled. Please advise

## 2024-04-08 ENCOUNTER — Ambulatory Visit: Payer: Self-pay

## 2024-04-08 ENCOUNTER — Telehealth: Payer: Self-pay | Admitting: Internal Medicine

## 2024-04-08 NOTE — Telephone Encounter (Signed)
 Due for hydrocodone  refill yesterday. Still not at pharmacy. Unable to pend medication, not on current medication list.  Filled 03/10/24 for 30 days.   [facility] [address].  Patient is worried she will go into withdrawals.  Reason for Disposition  Caller requesting a CONTROLLED substance prescription refill (e.g., narcotics, ADHD medicines)  Protocols used: Medication Refill and Renewal Call-A-AH

## 2024-04-08 NOTE — Telephone Encounter (Unsigned)
 Copied from CRM [id number]. Topic: Clinical - Medication Refill >> Apr 08, 2024  1:51 PM Trula Gable C wrote: Medication: HYDROcodone -acetaminophen  (NORCO) 10-325 MG tablet  Has the patient contacted their pharmacy? Yes (Agent: If no, request that the patient contact the pharmacy for the refill. If patient does not wish to contact the pharmacy document the reason why and proceed with request.) (Agent: If yes, when and what did the pharmacy advise?)  This is the patient's preferred pharmacy:  [facility] [address], [place] - [address] [place] [address] Phone: [phone] Fax: [phone]   Is this the correct pharmacy for this prescription? Yes If no, delete pharmacy and type the correct one.   Has the prescription been filled recently? No  Is the patient out of the medication? Yes  Has the patient been seen for an appointment in the last year OR does the patient have an upcoming appointment? Yes  Can we respond through MyChart? Yes  Agent: Please be advised that Rx refills may take up to 3 business days. We ask that you follow-up with your pharmacy.

## 2024-04-08 NOTE — Telephone Encounter (Signed)
 This Triage RN Attempted to contact the patient for assessment and medication history. No answer at this time. Left a voicemail.

## 2024-04-09 ENCOUNTER — Ambulatory Visit: Admitting: Family Medicine

## 2024-04-09 ENCOUNTER — Telehealth: Payer: Self-pay

## 2024-04-09 ENCOUNTER — Ambulatory Visit: Payer: Self-pay | Admitting: *Deleted

## 2024-04-09 ENCOUNTER — Other Ambulatory Visit: Payer: Self-pay | Admitting: Internal Medicine

## 2024-04-09 DIAGNOSIS — M150 Primary generalized (osteo)arthritis: Secondary | ICD-10-CM

## 2024-04-09 DIAGNOSIS — G8929 Other chronic pain: Secondary | ICD-10-CM

## 2024-04-09 DIAGNOSIS — Z515 Encounter for palliative care: Secondary | ICD-10-CM

## 2024-04-09 MED ORDER — HYDROCODONE-ACETAMINOPHEN 5-325 MG PO TABS: 1.0000 | ORAL_TABLET | Freq: Three times a day (TID) | ORAL | 0 refills | Status: DC

## 2024-04-09 NOTE — Telephone Encounter (Signed)
 Spoke with patient regarding her appointment today, appointment was scheduled just for pain medication refills. Patient states PCP is refusing to fill medication, even after seeing her on 04/10. Casimer Clear states patient does not need to come in for a visit today, she will provide patient with the refill she is requesting to save her an office visit today. Patient very appreciative. Appointment has been cancelled.

## 2024-04-09 NOTE — Telephone Encounter (Signed)
 Please advise this medication refill. I am unable to pend it as it's not on the medication list.

## 2024-04-09 NOTE — Telephone Encounter (Signed)
  Chief Complaint: back pain- chronic- medication request Symptoms: patient is very upset that she is unable to et pain medication Rx- she has requested it multiple times and she has not had response. Patient patient chart- oxycodone  not listed- appointment given to assess her pain medication needs Frequency: chronic  Disposition: [] ED /[] Urgent Care (no appt availability in office) / [x] Appointment(In office/virtual)/ []  [place] [facility]/ [] Home Care/ [] Refused Recommended Disposition /[]  Mobile Bus/ []  Follow-up with PCP Additional Notes: Patient has called several times to request Oxycodone - she states she takes it for her chronic back pain- she is very upset that she has not received her medication Rx. Appointment scheduled to discuss her need- the medication is not listed on her current medication list.    Copied from CRM [id number]. Topic: Clinical - Red Word Triage >> Apr 09, 2024  8:37 AM Alethia Huxley E wrote: [facility] that prompted transfer to Nurse Triage: Shoulder/back pain. Patient has been without her Hydrocodone  medication and in extreme back/shoulder pain. Patient rated the pain a 10 out of 10. Reason for Disposition  [1] SEVERE back pain (e.g., excruciating, unable to do any normal activities) AND [2] not improved 2 hours after pain medicine  Answer Assessment - Initial Assessment Questions 1. ONSET: "When did the pain begin?"      Chronic pain- osteoporosis- hx surgery 2. LOCATION: "Where does it hurt?" (upper, mid or lower back)     Pain all over 3. SEVERITY: "How bad is the pain?"  (e.g., Scale 1-10; mild, moderate, or severe)   - MILD (1-3): Doesn't interfere with normal activities.    - MODERATE (4-7): Interferes with normal activities or awakens from sleep.    - SEVERE (8-10): Excruciating pain, unable to do any normal activities.      Severe  4. PATTERN: "Is the pain constant?" (e.g., yes, no; constant, intermittent)      chronic  6. CAUSE:  "What do you  think is causing the back pain?"      Chronic pain- osteoporosis   8. MEDICINES: "What have you taken so far for the pain?" (e.g., nothing, acetaminophen , NSAIDS)     Oxycodone   10mg   Protocols used: Back Pain-A-AH

## 2024-04-27 ENCOUNTER — Other Ambulatory Visit: Payer: Self-pay | Admitting: Internal Medicine

## 2024-04-27 DIAGNOSIS — I10 Essential (primary) hypertension: Secondary | ICD-10-CM

## 2024-04-27 DIAGNOSIS — I5032 Chronic diastolic (congestive) heart failure: Secondary | ICD-10-CM

## 2024-05-05 ENCOUNTER — Other Ambulatory Visit: Payer: Self-pay | Admitting: Internal Medicine

## 2024-05-05 NOTE — Telephone Encounter (Unsigned)
 Copied from CRM [id number]. Topic: Clinical - Medication Refill >> May 05, 2024  9:04 AM Jenice Mitts wrote: Medication: HYDROcodone -acetaminophen  (NORCO/VICODIN) 5-325 MG tablet   Has the patient contacted their pharmacy? Yes (Agent: If no, request that the patient contact the pharmacy for the refill. If patient does not wish to contact the pharmacy document the reason why and proceed with request.) (Agent: If yes, when and what did the pharmacy advise?)  This is the patient's preferred pharmacy:  [facility] [address], [place] - [address] [place] [address] Phone: [phone] Fax: [phone]   Is this the correct pharmacy for this prescription? Yes If no, delete pharmacy and type the correct one.   Has the prescription been filled recently? Yes  Is the patient out of the medication? No, has enough until the 16th  Has the patient been seen for an appointment in the last year OR does the patient have an upcoming appointment? Yes  Can we respond through MyChart? No  Agent: Please be advised that Rx refills may take up to 3 business days. We ask that you follow-up with your pharmacy.

## 2024-05-10 ENCOUNTER — Other Ambulatory Visit: Payer: Self-pay | Admitting: Internal Medicine

## 2024-05-10 DIAGNOSIS — M150 Primary generalized (osteo)arthritis: Secondary | ICD-10-CM

## 2024-05-10 DIAGNOSIS — Z515 Encounter for palliative care: Secondary | ICD-10-CM

## 2024-05-10 DIAGNOSIS — M545 Low back pain, unspecified: Secondary | ICD-10-CM

## 2024-05-10 MED ORDER — HYDROCODONE-ACETAMINOPHEN 5-325 MG PO TABS: 1.0000 | ORAL_TABLET | Freq: Three times a day (TID) | ORAL | 0 refills | Status: DC

## 2024-05-10 NOTE — Telephone Encounter (Signed)
 Copied from CRM [id number]. Topic: Clinical - Medication Refill >> May 10, 2024  8:21 AM [place] E wrote: Medication: HYDROcodone -acetaminophen  (NORCO/VICODIN) 5-325 MG tablet  Has the patient contacted their pharmacy? No (Agent: If no, request that the patient contact the pharmacy for the refill. If patient does not wish to contact the pharmacy document the reason why and proceed with request.) (Agent: If yes, when and what did the pharmacy advise?)  This is the patient's preferred pharmacy:  [facility] [address] [place] - [address] [place] [address] Phone: [phone] Fax: [phone]   Is this the correct pharmacy for this prescription? Yes If no, delete pharmacy and type the correct one.   Has the prescription been filled recently? Yes  Is the patient out of the medication? Yes  Has the patient been seen for an appointment in the last year OR does the patient have an upcoming appointment? Yes  Can we respond through MyChart? Yes  Agent: Please be advised that Rx refills may take up to 3 business days. We ask that you follow-up with your pharmacy.

## 2024-06-03 ENCOUNTER — Ambulatory Visit (INDEPENDENT_AMBULATORY_CARE_PROVIDER_SITE_OTHER): Admitting: Internal Medicine

## 2024-06-03 ENCOUNTER — Encounter: Payer: Self-pay | Admitting: Internal Medicine

## 2024-06-03 VITALS — BP 150/86 | HR 86 | Temp 98.0°F | Ht 63.0 in | Wt 186.8 lb

## 2024-06-03 DIAGNOSIS — M150 Primary generalized (osteo)arthritis: Secondary | ICD-10-CM | POA: Diagnosis not present

## 2024-06-03 DIAGNOSIS — F411 Generalized anxiety disorder: Secondary | ICD-10-CM | POA: Diagnosis not present

## 2024-06-03 DIAGNOSIS — M545 Low back pain, unspecified: Secondary | ICD-10-CM | POA: Diagnosis not present

## 2024-06-03 DIAGNOSIS — G8929 Other chronic pain: Secondary | ICD-10-CM

## 2024-06-03 DIAGNOSIS — Z515 Encounter for palliative care: Secondary | ICD-10-CM | POA: Diagnosis not present

## 2024-06-03 DIAGNOSIS — I10 Essential (primary) hypertension: Secondary | ICD-10-CM

## 2024-06-03 MED ORDER — HYDROCODONE-ACETAMINOPHEN 10-325 MG PO TABS: 1.0000 | ORAL_TABLET | Freq: Four times a day (QID) | ORAL | 0 refills | Status: DC | PRN

## 2024-06-03 MED ORDER — CLONAZEPAM 1 MG PO TABS: 1.0000 mg | ORAL_TABLET | Freq: Two times a day (BID) | ORAL | 5 refills | Status: DC | PRN

## 2024-06-03 NOTE — Progress Notes (Signed)
 Subjective:  Patient ID: [name], female    DOB: 29-Apr-1933  Age: 89 y.o. MRN: [id number]  CC: Medical Management of Chronic Issues (3 month follow up. Patient states that she wants to discuss her hands, shoulder and her toe. ), Osteoarthritis, and Hypertension   HPI [name] presents for f/up ----  Discussed the use of AI scribe software for clinical note transcription with the patient, who gave verbal consent to proceed.  History of Present Illness   [name] is an 89 year old female with osteoporosis who presents with inadequate pain management and joint pain.  She experiences pain in her shoulder, particularly at night when turning over, and in her big toe, which worsens with walking. Her current pain medication, taken twice daily, is ineffective. The joint pain is constant, with no associated swelling. She is not getting adequate pain relief with the current Norco dose.  She has a history of a broken ankle from years ago, which she believes contributes to her current toe pain.       Outpatient Medications Prior to Visit  Medication Sig Dispense Refill   albuterol  (PROVENTIL ) (2.5 MG/3ML) 0.083% nebulizer solution INHALE CONTENTS OF 1 VIAL PER NEBULIZER EVERY 6 HOURS AS NEEDED FOR WHEEZING OR SHORTNESS OF BREATH 225 mL 3   alendronate  (FOSAMAX ) 70 MG tablet TAKE 1 TABLET(70 MG) BY MOUTH 1 TIME A WEEK WITH A FULL GLASS OF WATER AND ON AN EMPTY STOMACH 12 tablet 3   carvedilol  (COREG ) 12.5 MG tablet Take 1 tablet (12.5 mg total) by mouth 2 (two) times daily with a meal. 180 tablet 3   Cholecalciferol  2000 units TABS Take 2 tablets (4,000 Units total) by mouth daily. 180 tablet 1   hydrALAZINE  (APRESOLINE ) 25 MG tablet Take 1 tablet (25 mg total) by mouth 3 (three) times daily. 270 tablet 0   omeprazole  (PRILOSEC) 40 MG capsule TAKE 1 CAPSULE(40 MG) BY MOUTH DAILY 90 capsule 0   ondansetron  (ZOFRAN ) 4 MG tablet Take 1 tablet (4 mg total) by mouth every 6 (six) hours. 12  tablet 0   oxybutynin (DITROPAN-XL) 10 MG 24 hr tablet Take 10 mg by mouth daily.     promethazine  (PHENERGAN ) 12.5 MG tablet TAKE 1 TABLET(12.5 MG) BY MOUTH EVERY 8 HOURS AS NEEDED FOR NAUSEA OR VOMITING 20 tablet 2   rivaroxaban  (XARELTO ) 20 MG TABS tablet Take 1 tablet (20 mg total) by mouth daily with supper. 90 tablet 1   spironolactone  (ALDACTONE ) 25 MG tablet Take 1 tablet (25 mg total) by mouth daily. 90 tablet 3   torsemide  (DEMADEX ) 20 MG tablet TAKE 1 TABLET BY MOUTH EVERY DAY 90 tablet 1   traZODone  (DESYREL ) 100 MG tablet TAKE 1 TABLET(100 MG) BY MOUTH AT BEDTIME 90 tablet 1   clonazePAM  (KLONOPIN ) 1 MG tablet Take 1 tablet (1 mg total) by mouth 2 (two) times daily as needed for anxiety. 60 tablet 2   HYDROcodone -acetaminophen  (NORCO/VICODIN) 5-325 MG tablet Take 1 tablet by mouth every 8 (eight) hours. 90 tablet 0   amoxicillin -clavulanate (AUGMENTIN ) 875-125 MG tablet Take 1 tablet by mouth every 12 (twelve) hours. 20 tablet 0   No facility-administered medications prior to visit.    ROS Review of Systems  Constitutional: Negative.  Negative for appetite change, chills, diaphoresis, fatigue and fever.  HENT: Negative.    Eyes: Negative.   Respiratory: Negative.  Negative for cough, chest tightness, shortness of breath and wheezing.   Cardiovascular:  Negative for  chest pain, palpitations and leg swelling.  Gastrointestinal:  Negative for abdominal pain, constipation, diarrhea, nausea and vomiting.  Endocrine: Negative.   Genitourinary: Negative.  Negative for difficulty urinating and dysuria.  Musculoskeletal:  Positive for arthralgias. Negative for back pain and myalgias.  Skin: Negative.   Neurological: Negative.  Negative for dizziness and weakness.  Hematological:  Negative for adenopathy. Does not bruise/bleed easily.  Psychiatric/Behavioral:  Positive for confusion and decreased concentration. Negative for behavioral problems and sleep disturbance. The patient is  nervous/anxious.     Objective:  BP (!) 150/86 (BP Location: Left Arm, Patient Position: Sitting, Cuff Size: Normal)   Pulse 86   Temp 98 F (36.7 C) (Oral)   Ht 5' 3 (1.6 m)   Wt 186 lb 12.8 oz (84.7 kg)   SpO2 94%   BMI 33.09 kg/m   BP Readings from Last 3 Encounters:  06/03/24 (!) 150/86  04/06/24 (!) 163/114  03/29/24 (!) 140/86    Wt Readings from Last 3 Encounters:  06/03/24 186 lb 12.8 oz (84.7 kg)  03/29/24 188 lb 12.8 oz (85.6 kg)  03/04/24 187 lb 12.8 oz (85.2 kg)    Physical Exam Vitals reviewed.  Constitutional:      Appearance: Normal appearance.  HENT:     Nose: Nose normal.     Mouth/Throat:     Mouth: Mucous membranes are moist.  Eyes:     General: No scleral icterus.    Conjunctiva/sclera: Conjunctivae normal.  Cardiovascular:     Rate and Rhythm: Normal rate and regular rhythm.     Heart sounds: Murmur heard.     Systolic murmur is present with a grade of 2/6.     No diastolic murmur is present.     No friction rub. No gallop.  Pulmonary:     Effort: Pulmonary effort is normal.     Breath sounds: No stridor. No wheezing, rhonchi or rales.  Abdominal:     General: Abdomen is flat and protuberant.     Palpations: There is no mass.     Tenderness: There is no abdominal tenderness. There is no guarding.     Hernia: No hernia is present.  Musculoskeletal:     Cervical back: Neck supple.     Right lower leg: No edema.     Left lower leg: No edema.  Lymphadenopathy:     Cervical: No cervical adenopathy.  Skin:    General: Skin is warm and dry.     Coloration: Skin is not pale.  Neurological:     General: No focal deficit present.     Mental Status: She is alert.  Psychiatric:        Mood and Affect: Mood normal.        Behavior: Behavior normal.     Lab Results  Component Value Date   WBC 7.9 04/06/2024   HGB 12.7 04/06/2024   HCT 41.1 04/06/2024   PLT 153 04/06/2024   GLUCOSE 143 (H) 04/06/2024   CHOL 175 10/20/2023   TRIG  135.0 10/20/2023   HDL 49.90 10/20/2023   LDLDIRECT 121.0 06/12/2017   LDLCALC 98 10/20/2023   ALT 16 04/06/2024   AST 20 04/06/2024   NA 138 04/06/2024   K 3.7 04/06/2024   CL 100 04/06/2024   CREATININE 0.98 04/06/2024   BUN 18 04/06/2024   CO2 29 04/06/2024   TSH 2.17 10/20/2023   INR 1.0 08/11/2020   HGBA1C 4.8 09/19/2021    CT ABDOMEN PELVIS W  CONTRAST Result Date: 04/06/2024 CLINICAL DATA:  Abdominal pain, acute, nonlocalized EXAM: CT ABDOMEN AND PELVIS WITH CONTRAST TECHNIQUE: Multidetector CT imaging of the abdomen and pelvis was performed using the standard protocol following bolus administration of intravenous contrast. RADIATION DOSE REDUCTION: This exam was performed according to the departmental dose-optimization program which includes automated exposure control, adjustment of the mA and/or kV according to patient size and/or use of iterative reconstruction technique. CONTRAST:  75mL OMNIPAQUE  IOHEXOL  350 MG/ML SOLN COMPARISON:  11/24/2023. FINDINGS: Lower chest: Similar chronic right basilar scarring and bibasilar bronchial wall thickening. Hepatobiliary: Diffusely decreased attenuation of the hepatic parenchyma, compatible with hepatic steatosis. No suspicious focal liver abnormality is seen. Status post cholecystectomy. No biliary dilatation. Pancreas: Unremarkable. No pancreatic ductal dilatation or surrounding inflammatory changes. Spleen: Normal in size without focal abnormality. Adrenals/Urinary Tract: Essentially unchanged 3.1 x 2.1 cm right adrenal nodule, previously measuring 3.1 x 2.0 cm, indeterminate and not significantly changed dating to 2019. Left adrenal gland is unremarkable. Kidneys enhance symmetrically. No suspicious focal lesion. No urolithiasis or hydronephrosis. Bladder is unremarkable. Stomach/Bowel: Similar small hiatal hernia. Small bowel is grossly unremarkable. No evidence of obstruction. Diffuse descending and sigmoid colonic diverticulosis with long  segment wall thickening of the sigmoid colon and surrounding fat stranding, most compatible with acute diverticulitis. No evidence of perforation or abscess. Vascular/Lymphatic: The abdominal aorta is normal in caliber with extensive atherosclerotic calcification. No enlarged abdominal or pelvic lymph nodes. Reproductive: Status post hysterectomy. No adnexal masses. Other: No significant abdominopelvic ascites. No intraperitoneal free air. Similar small fat containing bilateral inguinal hernias Musculoskeletal: No acute osseous abnormality. No suspicious osseous lesion. Chronic T12 compression deformity status post vertebral augmentation. Severe degenerative changes of the bilateral hips. IMPRESSION: 1. Diffuse descending and sigmoid colonic diverticulosis with long segment wall thickening of the sigmoid colon and surrounding fat stranding, most compatible with acute diverticulitis. No evidence of perforation or abscess. 2. Stable 3.1 cm indeterminate right adrenal nodule is not significantly changed dating to 2019, favored to represent an adenoma. 3. Hepatic steatosis. 4.  Aortic Atherosclerosis (ICD10-I70.0). Electronically Signed   By: Harrietta Sherry M.D.   On: 04/06/2024 15:51    Assessment & Plan:  GAD (generalized anxiety disorder) -     clonazePAM ; Take 1 tablet (1 mg total) by mouth 2 (two) times daily as needed for anxiety.  Dispense: 60 tablet; Refill: 5  Primary osteoarthritis involving multiple joints -     HYDROcodone -Acetaminophen ; Take 1 tablet by mouth every 6 (six) hours as needed.  Dispense: 120 tablet; Refill: 0  Chronic bilateral low back pain without sciatica -     HYDROcodone -Acetaminophen ; Take 1 tablet by mouth every 6 (six) hours as needed.  Dispense: 120 tablet; Refill: 0  Encounter for palliative care involving management of pain -     HYDROcodone -Acetaminophen ; Take 1 tablet by mouth every 6 (six) hours as needed.  Dispense: 120 tablet; Refill: 0  Essential hypertension-  BP is well controlled.     Follow-up: Return in about 6 months (around 12/04/2024).  Debby Molt, MD

## 2024-06-03 NOTE — Patient Instructions (Signed)

## 2024-06-11 DIAGNOSIS — N3000 Acute cystitis without hematuria: Secondary | ICD-10-CM | POA: Diagnosis not present

## 2024-06-11 DIAGNOSIS — N3946 Mixed incontinence: Secondary | ICD-10-CM | POA: Diagnosis not present

## 2024-06-14 ENCOUNTER — Other Ambulatory Visit: Payer: Self-pay | Admitting: Internal Medicine

## 2024-06-14 DIAGNOSIS — I10 Essential (primary) hypertension: Secondary | ICD-10-CM

## 2024-06-14 DIAGNOSIS — I5032 Chronic diastolic (congestive) heart failure: Secondary | ICD-10-CM

## 2024-06-30 ENCOUNTER — Telehealth: Payer: Self-pay | Admitting: Internal Medicine

## 2024-06-30 DIAGNOSIS — M150 Primary generalized (osteo)arthritis: Secondary | ICD-10-CM

## 2024-06-30 DIAGNOSIS — M545 Low back pain, unspecified: Secondary | ICD-10-CM

## 2024-06-30 DIAGNOSIS — Z515 Encounter for palliative care: Secondary | ICD-10-CM

## 2024-06-30 NOTE — Telephone Encounter (Unsigned)
 Copied from CRM [id number]. Topic: Clinical - Medication Refill >> Jun 30, 2024  3:40 PM Turkey A wrote: Medication: HYDROcodone -acetaminophen  (NORCO) 10-325 MG tablet  Has the patient contacted their pharmacy? No (Agent: If no, request that the patient contact the pharmacy for the refill. If patient does not wish to contact the pharmacy document the reason why and proceed with request.) (Agent: If yes, when and what did the pharmacy advise?)  This is the patient's preferred pharmacy:  [facility] [address], [place] - [address] [place] [address] Phone: [phone] Fax: [phone]  Is this the correct pharmacy for this prescription? Yes If no, delete pharmacy and type the correct one.   Has the prescription been filled recently? No  Is the patient out of the medication? Yes  Has the patient been seen for an appointment in the last year OR does the patient have an upcoming appointment? Yes  Can we respond through MyChart? Yes  Agent: Please be advised that Rx refills may take up to 3 business days. We ask that you follow-up with your pharmacy.

## 2024-07-02 ENCOUNTER — Other Ambulatory Visit: Payer: Self-pay

## 2024-07-02 DIAGNOSIS — M150 Primary generalized (osteo)arthritis: Secondary | ICD-10-CM

## 2024-07-02 DIAGNOSIS — M545 Low back pain, unspecified: Secondary | ICD-10-CM

## 2024-07-02 DIAGNOSIS — Z515 Encounter for palliative care: Secondary | ICD-10-CM

## 2024-07-02 NOTE — Telephone Encounter (Unsigned)
 Copied from CRM [id number]. Topic: Clinical - Medication Refill >> Jun 30, 2024  3:40 PM Turkey A wrote: Medication: HYDROcodone -acetaminophen  (NORCO) 10-325 MG tablet  Has the patient contacted their pharmacy? No (Agent: If no, request that the patient contact the pharmacy for the refill. If patient does not wish to contact the pharmacy document the reason why and proceed with request.) (Agent: If yes, when and what did the pharmacy advise?)  This is the patient's preferred pharmacy:  [facility] [address], [place] - [address] [place] [address] Phone: [phone] Fax: [phone]  Is this the correct pharmacy for this prescription? Yes If no, delete pharmacy and type the correct one.   Has the prescription been filled recently? No  Is the patient out of the medication? Yes  Has the patient been seen for an appointment in the last year OR does the patient have an upcoming appointment? Yes  Can we respond through MyChart? Yes  Agent: Please be advised that Rx refills may take up to 3 business days. We ask that you follow-up with your pharmacy. >> Jul 02, 2024  9:13 AM Rosina BIRCH wrote: Patient called stating she is checking on her medication refill. Patient was advised on the three day turnaround

## 2024-07-02 NOTE — Telephone Encounter (Signed)
 Medication refill has been sent to Dr. Yetta Barre

## 2024-07-03 MED ORDER — HYDROCODONE-ACETAMINOPHEN 10-325 MG PO TABS
1.0000 | ORAL_TABLET | Freq: Four times a day (QID) | ORAL | 0 refills | Status: DC | PRN
Start: 2024-07-03 — End: 2024-07-28

## 2024-07-28 ENCOUNTER — Other Ambulatory Visit: Payer: Self-pay | Admitting: Internal Medicine

## 2024-07-28 DIAGNOSIS — Z515 Encounter for palliative care: Secondary | ICD-10-CM

## 2024-07-28 DIAGNOSIS — M150 Primary generalized (osteo)arthritis: Secondary | ICD-10-CM

## 2024-07-28 DIAGNOSIS — G8929 Other chronic pain: Secondary | ICD-10-CM

## 2024-07-28 NOTE — Telephone Encounter (Unsigned)
 Copied from CRM [id number]. Topic: Clinical - Medication Refill >> Jul 28, 2024  3:53 PM Burnard DEL wrote: Medication: HYDROcodone -acetaminophen  (NORCO) 10-325 MG tablet  Has the patient contacted their pharmacy? No (Agent: If no, request that the patient contact the pharmacy for the refill. If patient does not wish to contact the pharmacy document the reason why and proceed with request.) (Agent: If yes, when and what did the pharmacy advise?)  This is the patient's preferred pharmacy:  [facility] [address] [place] - [address] [place] [address] Phone: [phone] Fax: [phone]   Is this the correct pharmacy for this prescription? Yes If no, delete pharmacy and type the correct one.   Has the prescription been filled recently? No  Is the patient out of the medication? No(have a few left)  Has the patient been seen for an appointment in the last year OR does the patient have an upcoming appointment? Yes  Can we respond through MyChart? Yes  Agent: Please be advised that Rx refills may take up to 3 business days. We ask that you follow-up with your pharmacy.

## 2024-07-29 MED ORDER — HYDROCODONE-ACETAMINOPHEN 10-325 MG PO TABS: 1.0000 | ORAL_TABLET | Freq: Four times a day (QID) | ORAL | 0 refills | Status: DC | PRN

## 2024-08-04 ENCOUNTER — Ambulatory Visit: Admitting: Internal Medicine

## 2024-08-12 DIAGNOSIS — H40013 Open angle with borderline findings, low risk, bilateral: Secondary | ICD-10-CM | POA: Diagnosis not present

## 2024-08-12 DIAGNOSIS — R7309 Other abnormal glucose: Secondary | ICD-10-CM | POA: Diagnosis not present

## 2024-08-12 DIAGNOSIS — H04123 Dry eye syndrome of bilateral lacrimal glands: Secondary | ICD-10-CM | POA: Diagnosis not present

## 2024-08-12 DIAGNOSIS — H35363 Drusen (degenerative) of macula, bilateral: Secondary | ICD-10-CM | POA: Diagnosis not present

## 2024-08-16 DIAGNOSIS — R7309 Other abnormal glucose: Secondary | ICD-10-CM | POA: Diagnosis not present

## 2024-08-16 DIAGNOSIS — H04123 Dry eye syndrome of bilateral lacrimal glands: Secondary | ICD-10-CM | POA: Diagnosis not present

## 2024-08-16 DIAGNOSIS — H35363 Drusen (degenerative) of macula, bilateral: Secondary | ICD-10-CM | POA: Diagnosis not present

## 2024-08-16 DIAGNOSIS — H40013 Open angle with borderline findings, low risk, bilateral: Secondary | ICD-10-CM | POA: Diagnosis not present

## 2024-08-18 ENCOUNTER — Ambulatory Visit (HOSPITAL_BASED_OUTPATIENT_CLINIC_OR_DEPARTMENT_OTHER): Admitting: Cardiology

## 2024-08-18 ENCOUNTER — Encounter (HOSPITAL_BASED_OUTPATIENT_CLINIC_OR_DEPARTMENT_OTHER): Payer: Self-pay | Admitting: Cardiology

## 2024-08-18 VITALS — BP 132/86 | HR 79 | Ht 63.0 in | Wt 183.7 lb

## 2024-08-18 DIAGNOSIS — I4819 Other persistent atrial fibrillation: Secondary | ICD-10-CM

## 2024-08-18 DIAGNOSIS — I5032 Chronic diastolic (congestive) heart failure: Secondary | ICD-10-CM

## 2024-08-18 DIAGNOSIS — D6869 Other thrombophilia: Secondary | ICD-10-CM

## 2024-08-18 DIAGNOSIS — Z7901 Long term (current) use of anticoagulants: Secondary | ICD-10-CM | POA: Diagnosis not present

## 2024-08-18 DIAGNOSIS — I10 Essential (primary) hypertension: Secondary | ICD-10-CM

## 2024-08-18 NOTE — Progress Notes (Signed)
 Cardiology Office Note:  .   Date:  08/18/2024  ID:  [name], DOB 07/23/1933, MRN [id number] PCP: Joshua Debby CROME, MD  [facility] Providers Cardiologist:  Shelda Bruckner, MD {  History of Present Illness: .   [name] is a 89 y.o. female with a hx of chronic diastolic heart failure, hypertension who is seen for follow up today. I initially met her 07/16/19 as a new consult at the request of Joshua Debby CROME, MD for the evaluation and management of aortic stenosis and concern for diastolic heart failure.   Today: Her pharmacist told her to ask about clopidogrel instead of Xarelto  since they do the same thing. We reviewed that this is not the case, how the two differ. She is paying about $45 per month for Xarelto . She cannot feel whether she is in afib or regular rhythm. We again discussed rhythm vs. Rate control. No major bleeding issues, had one episode of constipation with some hemorrhoidal bleeding but that's all.   Overall she feels like she is doing ok. She sometimes feels a little dizzy but no falls.   Breathing is stable, uses O2 and breathing treatments as recommended. She did not tolerate CPAP.  ROS: Denies chest pain, shortness of breath at rest or with normal exertion. No PND, orthopnea, LE edema or unexpected weight gain. No syncope or palpitations. ROS otherwise negative except as noted.   Studies Reviewed: SABRA    EKG:       Physical Exam:   VS:  BP 132/86   Pulse 79   Ht 5' 3 (1.6 m)   Wt 183 lb 11.2 oz (83.3 kg)   SpO2 95%   BMI 32.54 kg/m    Wt Readings from Last 3 Encounters:  08/18/24 183 lb 11.2 oz (83.3 kg)  06/03/24 186 lb 12.8 oz (84.7 kg)  03/29/24 188 lb 12.8 oz (85.6 kg)    GEN: Well nourished, well developed in no acute distress HEENT: Normal, moist mucous membranes NECK: No JVD CARDIAC: irregularly irregular rhythm, normal S1 and S2, no rubs or gallops. 1/6 systolic murmur. VASCULAR: Radial and DP pulses 2+ bilaterally. No  carotid bruits RESPIRATORY:  soft velcro crackles most prominent at bases ABDOMEN: Soft, non-tender, non-distended MUSCULOSKELETAL:  Ambulates independently with rollator today SKIN: Warm and dry, no edema NEUROLOGIC:  Alert and oriented x 3. No focal neuro deficits noted. PSYCHIATRIC:  Normal affect    ASSESSMENT AND PLAN: .    atrial fibrillation, persistent -diagnosed 11/2023 -CHA2DS2/VAS Stroke Risk Points=5 -tolerating Xarelto . Reviewed that clopidogrel is not an equal swap, see HPI. -labs have been stable -reviewed watching for bleeding -discussed rate vs rhythm control again. As she does not feel afib and does not have significant symptoms, will continue with rate control. Instructed to have her call me if she has any new symptoms  Chronic diastolic heart failure -appears euvolemic on exam -reviewed heart failure education, including daily weights, salt avoidance, fluid restrictions -doing much better on daily torsemide  -continue spironolactone  25 mg daily -on home O2 with exertion and sleep, has been persistent since covid   Hypertension: prior hospitalization for hypertensive urgency 07/2020 -continue hydralazine  25 mg TID -continue carvedilol  12.5 mg BID. Avoid bradycardia due to diastolic dysfunction -continue spirinolactone 25 mg daily -continue torsemide  20 mg daily -cannot tolerate amlodipine , ACEi/ARB, see allergies   Murmur: aortic sclerosis with mild stenosis. Unchanged on exam today   OSA: not tolerating CPAP, on home O2  Dispo: 6 mos or  sooner as needed  Signed, Shelda Bruckner, MD   Shelda Bruckner, MD, PhD, [facility]  [facility]  [place]  [facility] at [facility] at [facility] [address], [address] [place], [place] [address] [phone]

## 2024-08-18 NOTE — Patient Instructions (Signed)
 Medication Instructions:  No changes *If you need a refill on your cardiac medications before your next appointment, please call your pharmacy*  Lab Work: none If you have labs (blood work) drawn today and your tests are completely normal, you will receive your results only by: MyChart Message (if you have MyChart) OR A paper copy in the mail If you have any lab test that is abnormal or we need to change your treatment, we will call you to review the results.  Testing/Procedures: none  Follow-Up: At [facility], you and your health needs are our priority.  As part of our continuing mission to provide you with exceptional heart care, our providers are all part of one team.  This team includes your primary Cardiologist (physician) and Advanced Practice Providers or APPs (Physician Assistants and Nurse Practitioners) who all work together to provide you with the care you need, when you need it.  Your next appointment:   6 month(s)  Provider:   Shelda Bruckner, MD, Rosaline Bane, NP, or Reche Finder, NP

## 2024-08-19 ENCOUNTER — Other Ambulatory Visit: Payer: Self-pay | Admitting: Internal Medicine

## 2024-08-19 DIAGNOSIS — K219 Gastro-esophageal reflux disease without esophagitis: Secondary | ICD-10-CM

## 2024-08-26 ENCOUNTER — Other Ambulatory Visit: Payer: Self-pay | Admitting: Family

## 2024-08-26 DIAGNOSIS — G8929 Other chronic pain: Secondary | ICD-10-CM

## 2024-08-26 DIAGNOSIS — M150 Primary generalized (osteo)arthritis: Secondary | ICD-10-CM

## 2024-08-26 DIAGNOSIS — Z515 Encounter for palliative care: Secondary | ICD-10-CM

## 2024-08-26 NOTE — Telephone Encounter (Unsigned)
 Copied from CRM [id number]. Topic: Clinical - Medication Refill >> Aug 26, 2024  2:30 PM Leah W wrote: Medication:  HYDROcodone -acetaminophen  (NORCO) 10-325 MG tablet    Has the patient contacted their pharmacy? No (Agent: If no, request that the patient contact the pharmacy for the refill. If patient does not wish to contact the pharmacy document the reason why and proceed with request.) (Agent: If yes, when and what did the pharmacy advise?)  This is the patient's preferred pharmacy:  [facility] [address], [place] - [address] [place] [address] Phone: [phone] Fax: [phone]  Is this the correct pharmacy for this prescription? Yes If no, delete pharmacy and type the correct one.   Has the prescription been filled recently? No  Is the patient out of the medication? No  Has the patient been seen for an appointment in the last year OR does the patient have an upcoming appointment? Yes  Can we respond through MyChart? No  Agent: Please be advised that Rx refills may take up to 3 business days. We ask that you follow-up with your pharmacy.

## 2024-08-27 MED ORDER — HYDROCODONE-ACETAMINOPHEN 10-325 MG PO TABS: 1.0000 | ORAL_TABLET | Freq: Four times a day (QID) | ORAL | 0 refills | Status: DC | PRN

## 2024-09-17 ENCOUNTER — Other Ambulatory Visit: Payer: Self-pay | Admitting: Internal Medicine

## 2024-09-17 DIAGNOSIS — G4700 Insomnia, unspecified: Secondary | ICD-10-CM

## 2024-09-21 ENCOUNTER — Other Ambulatory Visit: Payer: Self-pay | Admitting: Internal Medicine

## 2024-09-21 DIAGNOSIS — G8929 Other chronic pain: Secondary | ICD-10-CM

## 2024-09-21 DIAGNOSIS — M150 Primary generalized (osteo)arthritis: Secondary | ICD-10-CM

## 2024-09-21 DIAGNOSIS — Z515 Encounter for palliative care: Secondary | ICD-10-CM

## 2024-09-21 MED ORDER — HYDROCODONE-ACETAMINOPHEN 10-325 MG PO TABS: 1.0000 | ORAL_TABLET | Freq: Four times a day (QID) | ORAL | 0 refills | Status: DC | PRN

## 2024-09-21 NOTE — Telephone Encounter (Signed)
 Copied from CRM [id number]. Topic: Clinical - Medication Refill >> Sep 21, 2024  3:55 PM Viola F wrote: Medication: HYDROcodone -acetaminophen  (NORCO) 10-325 MG tablet [[id number]]  Has the patient contacted their pharmacy? Yes (Agent: If no, request that the patient contact the pharmacy for the refill. If patient does not wish to contact the pharmacy document the reason why and proceed with request.) (Agent: If yes, when and what did the pharmacy advise?)  This is the patient's preferred pharmacy:  [id number] [address], [place] - [address] [place] [address] Phone: [phone] Fax: [phone]  [facility] - [place], [place] - [address] [place] [address] Phone: [phone] Fax: [phone]  [id number] - [place], [place] - [address] [place] [address] Phone: [phone] Fax: [phone]   Is this the correct pharmacy for this prescription? Yes If no, delete pharmacy and type the correct one.   Has the prescription been filled recently? Yes  Is the patient out of the medication? No  Has the patient been seen for an appointment in the last year OR does the patient have an upcoming appointment? Yes  Can we respond through MyChart? Yes  Agent: Please be advised that Rx refills may take up to 3 business days. We ask that you follow-up with your pharmacy.

## 2024-09-21 NOTE — Telephone Encounter (Signed)
 This RN confirmed preferred pharmacy with patient.

## 2024-10-13 DIAGNOSIS — H04123 Dry eye syndrome of bilateral lacrimal glands: Secondary | ICD-10-CM | POA: Diagnosis not present

## 2024-10-15 ENCOUNTER — Other Ambulatory Visit: Payer: Self-pay | Admitting: Internal Medicine

## 2024-10-15 DIAGNOSIS — J4520 Mild intermittent asthma, uncomplicated: Secondary | ICD-10-CM

## 2024-10-19 ENCOUNTER — Other Ambulatory Visit: Payer: Self-pay | Admitting: Internal Medicine

## 2024-10-19 DIAGNOSIS — M150 Primary generalized (osteo)arthritis: Secondary | ICD-10-CM

## 2024-10-19 DIAGNOSIS — Z515 Encounter for palliative care: Secondary | ICD-10-CM

## 2024-10-19 DIAGNOSIS — G8929 Other chronic pain: Secondary | ICD-10-CM

## 2024-10-19 NOTE — Telephone Encounter (Signed)
 Copied from CRM [id number]. Topic: Clinical - Medication Refill >> Oct 19, 2024 12:04 PM Aleatha C wrote: Medication: HYDROcodone -acetaminophen  (NORCO) 10-325 MG tablet  Has the patient contacted their pharmacy? No (Agent: If no, request that the patient contact the pharmacy for the refill. If patient does not wish to contact the pharmacy document the reason why and proceed with request.) (Agent: If yes, when and what did the pharmacy advise?)  This is the patient's preferred pharmacy:  [facility] [address] [place] - [address] [place] [address] Phone: [phone] Fax: [phone]   Is this the correct pharmacy for this prescription? Yes If no, delete pharmacy and type the correct one.   Has the prescription been filled recently? No  Is the patient out of the medication? No  Has the patient been seen for an appointment in the last year OR does the patient have an upcoming appointment? Yes  Can we respond through MyChart? No  Agent: Please be advised that Rx refills may take up to 3 business days. We ask that you follow-up with your pharmacy.

## 2024-10-25 ENCOUNTER — Other Ambulatory Visit: Payer: Self-pay | Admitting: Internal Medicine

## 2024-10-25 DIAGNOSIS — I5032 Chronic diastolic (congestive) heart failure: Secondary | ICD-10-CM

## 2024-10-25 DIAGNOSIS — I10 Essential (primary) hypertension: Secondary | ICD-10-CM

## 2024-10-25 MED ORDER — HYDROCODONE-ACETAMINOPHEN 10-325 MG PO TABS: 1.0000 | ORAL_TABLET | Freq: Four times a day (QID) | ORAL | 0 refills | Status: DC | PRN

## 2024-11-01 ENCOUNTER — Encounter (INDEPENDENT_AMBULATORY_CARE_PROVIDER_SITE_OTHER): Payer: Medicare HMO | Admitting: Ophthalmology

## 2024-11-22 ENCOUNTER — Other Ambulatory Visit: Payer: Self-pay | Admitting: Internal Medicine

## 2024-11-22 DIAGNOSIS — Z515 Encounter for palliative care: Secondary | ICD-10-CM

## 2024-11-22 DIAGNOSIS — G8929 Other chronic pain: Secondary | ICD-10-CM

## 2024-11-22 DIAGNOSIS — M150 Primary generalized (osteo)arthritis: Secondary | ICD-10-CM

## 2024-11-22 MED ORDER — HYDROCODONE-ACETAMINOPHEN 10-325 MG PO TABS: 1.0000 | ORAL_TABLET | Freq: Four times a day (QID) | ORAL | 0 refills | Status: DC | PRN

## 2024-11-22 NOTE — Telephone Encounter (Signed)
 Copied from CRM #[id number]. Topic: Clinical - Medication Refill >> Nov 22, 2024 10:20 AM Donna BRAVO wrote: Medication: HYDROcodone -acetaminophen  (NORCO) 10-325 MG tablet   Has the patient contacted their pharmacy? No Pharmacy always says to call provider  This is the patient's preferred pharmacy:   [facility] [address] [place] - [address] [place] [address] Phone: [phone] Fax: [phone]   Is this the correct pharmacy for this prescription? Yes If no, delete pharmacy and type the correct one.   Has the prescription been filled recently? Yes  Is the patient out of the medication? No  Has the patient been seen for an appointment in the last year OR does the patient have an upcoming appointment? Yes  Can we respond through MyChart? No  Agent: Please be advised that Rx refills may take up to 3 business days. We ask that you follow-up with your pharmacy.

## 2024-11-26 ENCOUNTER — Other Ambulatory Visit: Payer: Self-pay | Admitting: Internal Medicine

## 2024-11-26 DIAGNOSIS — K219 Gastro-esophageal reflux disease without esophagitis: Secondary | ICD-10-CM

## 2024-12-03 ENCOUNTER — Other Ambulatory Visit: Payer: Self-pay | Admitting: Internal Medicine

## 2024-12-03 DIAGNOSIS — F411 Generalized anxiety disorder: Secondary | ICD-10-CM

## 2024-12-06 ENCOUNTER — Encounter: Payer: Self-pay | Admitting: Internal Medicine

## 2024-12-06 ENCOUNTER — Ambulatory Visit: Admitting: Internal Medicine

## 2024-12-06 VITALS — BP 132/74 | HR 91 | Temp 98.3°F | Ht 63.0 in | Wt 182.8 lb

## 2024-12-06 DIAGNOSIS — K219 Gastro-esophageal reflux disease without esophagitis: Secondary | ICD-10-CM | POA: Diagnosis not present

## 2024-12-06 DIAGNOSIS — Z0001 Encounter for general adult medical examination with abnormal findings: Secondary | ICD-10-CM

## 2024-12-06 DIAGNOSIS — Z23 Encounter for immunization: Secondary | ICD-10-CM | POA: Diagnosis not present

## 2024-12-06 DIAGNOSIS — R739 Hyperglycemia, unspecified: Secondary | ICD-10-CM

## 2024-12-06 DIAGNOSIS — Z0000 Encounter for general adult medical examination without abnormal findings: Secondary | ICD-10-CM

## 2024-12-06 DIAGNOSIS — I10 Essential (primary) hypertension: Secondary | ICD-10-CM | POA: Diagnosis not present

## 2024-12-06 DIAGNOSIS — I482 Chronic atrial fibrillation, unspecified: Secondary | ICD-10-CM

## 2024-12-06 DIAGNOSIS — F0390 Unspecified dementia without behavioral disturbance: Secondary | ICD-10-CM

## 2024-12-06 DIAGNOSIS — K222 Esophageal obstruction: Secondary | ICD-10-CM | POA: Diagnosis not present

## 2024-12-06 MED ORDER — COVID-19 MRNA VAC-TRIS(PFIZER) 30 MCG/0.3ML IM SUSY: 0.3000 mL | PREFILLED_SYRINGE | Freq: Once | INTRAMUSCULAR | 0 refills | Status: AC

## 2024-12-06 MED ORDER — PROMETHAZINE HCL 12.5 MG PO TABS: 12.5000 mg | ORAL_TABLET | Freq: Three times a day (TID) | ORAL | 2 refills | Status: AC | PRN

## 2024-12-06 NOTE — Patient Instructions (Signed)

## 2024-12-06 NOTE — Progress Notes (Signed)
 "  Subjective:  Patient ID: [name], female    DOB: 12-Jun-1933  Age: 89 y.o. MRN: [id number]  CC: Annual Exam, Atrial Fibrillation, and Osteoarthritis   HPI [name] presents for a CPX and f/up ----  Discussed the use of AI scribe software for clinical note transcription with the patient, who gave verbal consent to proceed.  History of Present Illness [name] is an 89 year old female who presents with right foot pain and medication management concerns.  She experiences intermittent pain in her right foot, describing it as 'a little painful.' The pain is triggered by bumping the foot and subsides on its own. She wonders if this could be related to something in her blood. The foot feels cold, especially after removing her sock.  She has concerns regarding the management of her hydrocodone  prescription, mentioning difficulty obtaining the medication from [facility] on Sundays. No new side effects from her medications are reported, and any side effects have been consistent for years.  She experiences occasional nausea, particularly when taking the wrong medication or eating certain foods, but does not frequently vomit. She uses promethazine  for nausea and vomiting but is currently out of this medication.  She reports that rushing can cause her to feel short of breath. She does not report feeling her heart beating irregularly. She mentions that rushing can cause her to feel short of breath. She uses oxygen  and has not had an EKG recently. She denies a history of atrial fibrillation. Her bowel movements are regular.     Outpatient Medications Prior to Visit  Medication Sig Dispense Refill   albuterol  (PROVENTIL ) (2.5 MG/3ML) 0.083% nebulizer solution INHALE CONTENTS OF 1 VIAL PER NEBULIZER EVERY 6 HOURS AS NEEDED FOR WHEEZING OR SHORTNESS OF BREATH 225 mL 3   alendronate  (FOSAMAX ) 70 MG tablet TAKE 1 TABLET(70 MG) BY MOUTH 1 TIME A WEEK WITH A FULL GLASS OF WATER AND ON AN EMPTY  STOMACH 12 tablet 3   carvedilol  (COREG ) 12.5 MG tablet Take 1 tablet (12.5 mg total) by mouth 2 (two) times daily with a meal. 180 tablet 3   Cholecalciferol  2000 units TABS Take 2 tablets (4,000 Units total) by mouth daily. 180 tablet 1   clonazePAM  (KLONOPIN ) 1 MG tablet TAKE 1 TABLET(1 MG) BY MOUTH TWICE DAILY AS NEEDED FOR ANXIETY 60 tablet 1   hydrALAZINE  (APRESOLINE ) 25 MG tablet TAKE 1 TABLET(25 MG) BY MOUTH THREE TIMES DAILY 270 tablet 0   HYDROcodone -acetaminophen  (NORCO) 10-325 MG tablet Take 1 tablet by mouth every 6 (six) hours as needed. 120 tablet 0   omeprazole  (PRILOSEC) 40 MG capsule TAKE 1 CAPSULE(40 MG) BY MOUTH DAILY 90 capsule 0   oxybutynin (DITROPAN-XL) 10 MG 24 hr tablet Take 10 mg by mouth daily.     rivaroxaban  (XARELTO ) 20 MG TABS tablet Take 1 tablet (20 mg total) by mouth daily with supper. 90 tablet 1   spironolactone  (ALDACTONE ) 25 MG tablet Take 1 tablet (25 mg total) by mouth daily. 90 tablet 3   torsemide  (DEMADEX ) 20 MG tablet TAKE 1 TABLET BY MOUTH EVERY DAY 90 tablet 1   traZODone  (DESYREL ) 100 MG tablet TAKE 1 TABLET(100 MG) BY MOUTH AT BEDTIME 90 tablet 1   ondansetron  (ZOFRAN ) 4 MG tablet Take 1 tablet (4 mg total) by mouth every 6 (six) hours. 12 tablet 0   promethazine  (PHENERGAN ) 12.5 MG tablet TAKE 1 TABLET(12.5 MG) BY MOUTH EVERY 8 HOURS AS NEEDED FOR NAUSEA OR VOMITING 20  tablet 2   No facility-administered medications prior to visit.    ROS Review of Systems  Constitutional:  Negative for appetite change, chills, diaphoresis, fatigue and fever.  HENT: Negative.    Eyes: Negative.  Negative for visual disturbance.  Respiratory:  Positive for apnea. Negative for cough, shortness of breath and wheezing.   Cardiovascular:  Negative for chest pain, palpitations and leg swelling.  Gastrointestinal:  Positive for nausea. Negative for abdominal pain, constipation, diarrhea and vomiting.  Endocrine: Negative.   Genitourinary: Negative.  Negative for  difficulty urinating and dysuria.  Musculoskeletal:  Positive for arthralgias. Negative for myalgias.  Skin: Negative.   Neurological:  Negative for dizziness, weakness and light-headedness.  Hematological:  Negative for adenopathy. Does not bruise/bleed easily.  Psychiatric/Behavioral:  Positive for confusion and decreased concentration. Negative for sleep disturbance. The patient is not nervous/anxious.     Objective:  BP 132/74 (BP Location: Left Arm, Patient Position: Sitting, Cuff Size: Normal)   Pulse 91   Temp 98.3 F (36.8 C) (Oral)   Ht 5' 3 (1.6 m)   Wt 182 lb 12.8 oz (82.9 kg)   SpO2 96%   BMI 32.38 kg/m   BP Readings from Last 3 Encounters:  12/06/24 132/74  08/18/24 132/86  06/03/24 (!) 150/86    Wt Readings from Last 3 Encounters:  12/06/24 182 lb 12.8 oz (82.9 kg)  08/18/24 183 lb 11.2 oz (83.3 kg)  06/03/24 186 lb 12.8 oz (84.7 kg)    Physical Exam Vitals reviewed.  Constitutional:      Appearance: Normal appearance.  Eyes:     General: No scleral icterus.    Conjunctiva/sclera: Conjunctivae normal.  Cardiovascular:     Rate and Rhythm: Normal rate. Rhythm irregularly irregular.     Heart sounds: Murmur heard.     Systolic murmur is present with a grade of 1/6.     No friction rub. No gallop.  Pulmonary:     Effort: Pulmonary effort is normal.     Breath sounds: No stridor. No wheezing, rhonchi or rales.  Abdominal:     General: Abdomen is protuberant. Bowel sounds are normal. There is no distension.     Palpations: There is no hepatomegaly, splenomegaly or mass.     Tenderness: There is no abdominal tenderness. There is no guarding.     Hernia: No hernia is present.  Musculoskeletal:        General: No swelling or tenderness.     Cervical back: Neck supple.     Right lower leg: No edema.     Left lower leg: No edema.  Skin:    General: Skin is warm and dry.     Findings: No rash.  Neurological:     General: No focal deficit present.      Mental Status: She is alert.  Psychiatric:        Attention and Perception: She is inattentive. She does not perceive auditory or visual hallucinations.        Mood and Affect: Mood normal.        Speech: Speech normal.        Behavior: Behavior normal.        Thought Content: Thought content normal.        Cognition and Memory: Cognition is impaired. Memory is impaired. She exhibits impaired recent memory and impaired remote memory.          Lab Results  Component Value Date   WBC 7.9 04/06/2024  HGB 12.7 04/06/2024   HCT 41.1 04/06/2024   PLT 153 04/06/2024   GLUCOSE 143 (H) 04/06/2024   CHOL 175 10/20/2023   TRIG 135.0 10/20/2023   HDL 49.90 10/20/2023   LDLDIRECT 121.0 06/12/2017   LDLCALC 98 10/20/2023   ALT 16 04/06/2024   AST 20 04/06/2024   NA 138 04/06/2024   K 3.7 04/06/2024   CL 100 04/06/2024   CREATININE 0.98 04/06/2024   BUN 18 04/06/2024   CO2 29 04/06/2024   TSH 2.17 10/20/2023   INR 1.0 08/11/2020   HGBA1C 4.8 09/19/2021    CT ABDOMEN PELVIS W CONTRAST Result Date: 04/06/2024 CLINICAL DATA:  Abdominal pain, acute, nonlocalized EXAM: CT ABDOMEN AND PELVIS WITH CONTRAST TECHNIQUE: Multidetector CT imaging of the abdomen and pelvis was performed using the standard protocol following bolus administration of intravenous contrast. RADIATION DOSE REDUCTION: This exam was performed according to the departmental dose-optimization program which includes automated exposure control, adjustment of the mA and/or kV according to patient size and/or use of iterative reconstruction technique. CONTRAST:  75mL OMNIPAQUE  IOHEXOL  350 MG/ML SOLN COMPARISON:  11/24/2023. FINDINGS: Lower chest: Similar chronic right basilar scarring and bibasilar bronchial wall thickening. Hepatobiliary: Diffusely decreased attenuation of the hepatic parenchyma, compatible with hepatic steatosis. No suspicious focal liver abnormality is seen. Status post cholecystectomy. No biliary dilatation.  Pancreas: Unremarkable. No pancreatic ductal dilatation or surrounding inflammatory changes. Spleen: Normal in size without focal abnormality. Adrenals/Urinary Tract: Essentially unchanged 3.1 x 2.1 cm right adrenal nodule, previously measuring 3.1 x 2.0 cm, indeterminate and not significantly changed dating to 2019. Left adrenal gland is unremarkable. Kidneys enhance symmetrically. No suspicious focal lesion. No urolithiasis or hydronephrosis. Bladder is unremarkable. Stomach/Bowel: Similar small hiatal hernia. Small bowel is grossly unremarkable. No evidence of obstruction. Diffuse descending and sigmoid colonic diverticulosis with long segment wall thickening of the sigmoid colon and surrounding fat stranding, most compatible with acute diverticulitis. No evidence of perforation or abscess. Vascular/Lymphatic: The abdominal aorta is normal in caliber with extensive atherosclerotic calcification. No enlarged abdominal or pelvic lymph nodes. Reproductive: Status post hysterectomy. No adnexal masses. Other: No significant abdominopelvic ascites. No intraperitoneal free air. Similar small fat containing bilateral inguinal hernias Musculoskeletal: No acute osseous abnormality. No suspicious osseous lesion. Chronic T12 compression deformity status post vertebral augmentation. Severe degenerative changes of the bilateral hips. IMPRESSION: 1. Diffuse descending and sigmoid colonic diverticulosis with long segment wall thickening of the sigmoid colon and surrounding fat stranding, most compatible with acute diverticulitis. No evidence of perforation or abscess. 2. Stable 3.1 cm indeterminate right adrenal nodule is not significantly changed dating to 2019, favored to represent an adenoma. 3. Hepatic steatosis. 4.  Aortic Atherosclerosis (ICD10-I70.0). Electronically Signed   By: Harrietta Sherry M.D.   On: 04/06/2024 15:51    Assessment & Plan:  Dementia arising in the senium and presenium (HCC) -     COVID-19 mRNA  Vac-TriS(Pfizer); Inject 0.3 mLs into the muscle once for 1 dose.  Dispense: 0.3 mL; Refill: 0  Essential hypertension- BP is well controlled. -     COVID-19 mRNA Vac-TriS(Pfizer); Inject 0.3 mLs into the muscle once for 1 dose.  Dispense: 0.3 mL; Refill: 0 -     Basic metabolic panel with GFR; Future -     CBC with Differential/Platelet; Future -     TSH; Future  Chronic hyperglycemia- Will screen for DM2. -     Basic metabolic panel with GFR; Future -     Hemoglobin A1c; Future  Encounter  for general adult medical examination with abnormal findings- Exam completed, labs reviewed, vaccines reviewed and updated, no cancer screeningsindicated, pt ed material was given.   Atrial fibrillation, chronic (HCC)- She has good rate control. -     COVID-19 mRNA Vac-TriS(Pfizer); Inject 0.3 mLs into the muscle once for 1 dose.  Dispense: 0.3 mL; Refill: 0 -     TSH; Future  GERD with stricture -     Promethazine  HCl; Take 1 tablet (12.5 mg total) by mouth every 8 (eight) hours as needed for nausea or vomiting.  Dispense: 20 tablet; Refill: 2  Flu vaccine need -     Flu vaccine HIGH DOSE PF(Fluzone Trivalent)     Follow-up: Return in about 4 months (around 04/05/2025).  Debby Molt, MD "

## 2024-12-09 DIAGNOSIS — Z23 Encounter for immunization: Secondary | ICD-10-CM

## 2024-12-16 ENCOUNTER — Telehealth: Payer: Self-pay

## 2024-12-16 NOTE — Telephone Encounter (Signed)
 Copied from CRM #[id number]. Topic: Clinical - Medication Refill >> Dec 16, 2024 12:15 PM Burnard DEL wrote: Medication: HYDROcodone -acetaminophen  (NORCO) 10-325 MG tablet  Has the patient contacted their pharmacy? No (Agent: If no, request that the patient contact the pharmacy for the refill. If patient does not wish to contact the pharmacy document the reason why and proceed with request.) (Agent: If yes, when and what did the pharmacy advise?)  This is the patient's preferred pharmacy:    [facility] [phone] [address], [place] - [address] [place] [address] Phone: [phone] Fax: [phone]  Is this the correct pharmacy for this prescription? Yes If no, delete pharmacy and type the correct one.   Has the prescription been filled recently? Yes  Is the patient out of the medication? No(few left)  Has the patient been seen for an appointment in the last year OR does the patient have an upcoming appointment? Yes  Can we respond through MyChart? Yes  Agent: Please be advised that Rx refills may take up to 3 business days. We ask that you follow-up with your pharmacy.

## 2024-12-17 ENCOUNTER — Other Ambulatory Visit: Payer: Self-pay

## 2024-12-17 DIAGNOSIS — Z515 Encounter for palliative care: Secondary | ICD-10-CM

## 2024-12-17 DIAGNOSIS — M150 Primary generalized (osteo)arthritis: Secondary | ICD-10-CM

## 2024-12-17 DIAGNOSIS — G8929 Other chronic pain: Secondary | ICD-10-CM

## 2024-12-17 MED ORDER — HYDROCODONE-ACETAMINOPHEN 10-325 MG PO TABS: 1.0000 | ORAL_TABLET | Freq: Four times a day (QID) | ORAL | 0 refills | Status: AC | PRN

## 2024-12-17 NOTE — Telephone Encounter (Signed)
 Medication refill request has been sent to Dr. Yetta Barre

## 2024-12-21 ENCOUNTER — Telehealth: Payer: Self-pay

## 2024-12-21 NOTE — Telephone Encounter (Signed)
 Patient has been made aware and she is a tad bit confused. I will reach out to the pharmacy and give the patient a call back.

## 2024-12-21 NOTE — Telephone Encounter (Signed)
 Copied from CRM #[id number]. Topic: Clinical - Prescription Issue >> Dec 21, 2024  2:47 PM Laymon HERO wrote: Reason for CRM: patient called pharmacy to check on refill of HYDROcodone -acetaminophen  (NORCO) 10-325 MG tablet - she said they do not have it and she is out of the medication and needs it

## 2024-12-22 NOTE — Telephone Encounter (Unsigned)
 Copied from CRM #[id number]. Topic: Clinical - Prescription Issue >> Dec 21, 2024  2:47 PM Laymon HERO wrote: Reason for CRM: patient called pharmacy to check on refill of HYDROcodone -acetaminophen  (NORCO) 10-325 MG tablet - she said they do not have it and she is out of the medication and needs it >> Dec 22, 2024 10:25 AM Avram MATSU wrote: Patient called to follow up about her medication please advise [phone]. Patient stated please get this filled  >> Dec 21, 2024  5:00 PM Drema MATSU wrote: Pt returned call regarding medication. She is requesting a call from [facility]. She said that pharmacy is counting her prescription wrong. She is wanting doctor to call and get it filled before 02/03.

## 2024-12-22 NOTE — Telephone Encounter (Unsigned)
 Copied from CRM #[id number]. Topic: Clinical - Prescription Issue >> Dec 21, 2024  2:47 PM Laymon HERO wrote: Reason for CRM: patient called pharmacy to check on refill of HYDROcodone -acetaminophen  (NORCO) 10-325 MG tablet - she said they do not have it and she is out of the medication and needs it >> Dec 21, 2024  5:00 PM Drema MATSU wrote: Pt returned call regarding medication. She is requesting a call from [facility]. She said that pharmacy is counting her prescription wrong. She is wanting doctor to call and get it filled before 02/03.

## 2024-12-24 ENCOUNTER — Encounter: Payer: Self-pay | Admitting: Pulmonary Disease

## 2024-12-24 NOTE — Telephone Encounter (Signed)
 Unable to reach patient. Unable to [facility]. Spoke with the pharmacy and the patient has picked up her medication. Please reach out to me when the patient calls back so that I can confirm that she has her medication

## 2024-12-28 ENCOUNTER — Encounter: Payer: Self-pay | Admitting: Pulmonary Disease

## 2024-12-28 NOTE — Telephone Encounter (Signed)
 Spoke with the patient and she did confirm that she received her medication.

## 2025-02-10 ENCOUNTER — Ambulatory Visit

## 2025-02-11 ENCOUNTER — Ambulatory Visit: Admitting: Pulmonary Disease

## 2025-02-14 ENCOUNTER — Ambulatory Visit

## 2025-03-08 ENCOUNTER — Ambulatory Visit (HOSPITAL_BASED_OUTPATIENT_CLINIC_OR_DEPARTMENT_OTHER): Admitting: Cardiology

## 2025-03-31 ENCOUNTER — Ambulatory Visit: Admitting: Pulmonary Disease

## 2025-04-06 ENCOUNTER — Ambulatory Visit: Admitting: Internal Medicine
